# Patient Record
Sex: Female | Born: 1965
Health system: Southern US, Community
[De-identification: ages and names within clinical notes are randomized; demographics above are authoritative.]

## PROBLEM LIST (undated history)

## (undated) DIAGNOSIS — D649 Anemia, unspecified: Secondary | ICD-10-CM

## (undated) DIAGNOSIS — L910 Hypertrophic scar: Secondary | ICD-10-CM

## (undated) DIAGNOSIS — M1990 Unspecified osteoarthritis, unspecified site: Secondary | ICD-10-CM

## (undated) DIAGNOSIS — Z5189 Encounter for other specified aftercare: Secondary | ICD-10-CM

## (undated) DIAGNOSIS — D219 Benign neoplasm of connective and other soft tissue, unspecified: Secondary | ICD-10-CM

## (undated) DIAGNOSIS — F419 Anxiety disorder, unspecified: Secondary | ICD-10-CM

## (undated) DIAGNOSIS — I2699 Other pulmonary embolism without acute cor pulmonale: Secondary | ICD-10-CM

## (undated) DIAGNOSIS — R0600 Dyspnea, unspecified: Secondary | ICD-10-CM

## (undated) DIAGNOSIS — R51 Headache: Secondary | ICD-10-CM

## (undated) DIAGNOSIS — I890 Lymphedema, not elsewhere classified: Secondary | ICD-10-CM

## (undated) DIAGNOSIS — F329 Major depressive disorder, single episode, unspecified: Secondary | ICD-10-CM

## (undated) HISTORY — DX: Other pulmonary embolism without acute cor pulmonale: I26.99

## (undated) HISTORY — PX: ABDOMINAL HYSTERECTOMY: SHX81

## (undated) HISTORY — DX: Dyspnea, unspecified: R06.00

## (undated) HISTORY — PX: OTHER SURGICAL HISTORY: SHX169

## (undated) SURGERY — Surgical Case
Anesthesia: *Unknown

---

## 1998-12-01 ENCOUNTER — Emergency Department (HOSPITAL_COMMUNITY): Admission: EM | Admit: 1998-12-01 | Discharge: 1998-12-01 | Payer: Self-pay | Admitting: Internal Medicine

## 1999-11-11 ENCOUNTER — Encounter: Payer: Self-pay | Admitting: Internal Medicine

## 1999-11-11 ENCOUNTER — Encounter: Admission: RE | Admit: 1999-11-11 | Discharge: 1999-11-11 | Payer: Self-pay | Admitting: Internal Medicine

## 1999-11-13 ENCOUNTER — Emergency Department (HOSPITAL_COMMUNITY): Admission: EM | Admit: 1999-11-13 | Discharge: 1999-11-13 | Payer: Self-pay | Admitting: Emergency Medicine

## 1999-11-13 ENCOUNTER — Encounter: Payer: Self-pay | Admitting: Emergency Medicine

## 2000-06-12 ENCOUNTER — Emergency Department (HOSPITAL_COMMUNITY): Admission: EM | Admit: 2000-06-12 | Discharge: 2000-06-12 | Payer: Self-pay | Admitting: Emergency Medicine

## 2000-06-12 ENCOUNTER — Encounter: Payer: Self-pay | Admitting: Emergency Medicine

## 2000-09-20 ENCOUNTER — Other Ambulatory Visit: Admission: RE | Admit: 2000-09-20 | Discharge: 2000-09-20 | Payer: Self-pay | Admitting: Obstetrics and Gynecology

## 2001-05-24 ENCOUNTER — Emergency Department (HOSPITAL_COMMUNITY): Admission: EM | Admit: 2001-05-24 | Discharge: 2001-05-24 | Payer: Self-pay | Admitting: Emergency Medicine

## 2002-01-19 ENCOUNTER — Emergency Department (HOSPITAL_COMMUNITY): Admission: EM | Admit: 2002-01-19 | Discharge: 2002-01-19 | Payer: Self-pay | Admitting: Emergency Medicine

## 2002-01-19 ENCOUNTER — Encounter: Payer: Self-pay | Admitting: Emergency Medicine

## 2003-05-18 ENCOUNTER — Other Ambulatory Visit: Admission: RE | Admit: 2003-05-18 | Discharge: 2003-05-18 | Payer: Self-pay | Admitting: Obstetrics and Gynecology

## 2004-08-20 ENCOUNTER — Emergency Department (HOSPITAL_COMMUNITY): Admission: EM | Admit: 2004-08-20 | Discharge: 2004-08-20 | Payer: Self-pay | Admitting: Family Medicine

## 2007-02-28 HISTORY — PX: HERNIA REPAIR: SHX51

## 2010-04-14 ENCOUNTER — Emergency Department (HOSPITAL_COMMUNITY): Payer: BC Managed Care – PPO

## 2010-04-14 ENCOUNTER — Emergency Department (HOSPITAL_COMMUNITY)
Admission: EM | Admit: 2010-04-14 | Discharge: 2010-04-15 | Disposition: A | Discharge status: Home / Self Care | Payer: BC Managed Care – PPO | Attending: Emergency Medicine | Admitting: Emergency Medicine

## 2010-04-14 DIAGNOSIS — R55 Syncope and collapse: Secondary | ICD-10-CM | POA: Insufficient documentation

## 2010-04-14 DIAGNOSIS — R0789 Other chest pain: Secondary | ICD-10-CM | POA: Insufficient documentation

## 2010-04-14 DIAGNOSIS — R42 Dizziness and giddiness: Secondary | ICD-10-CM | POA: Insufficient documentation

## 2010-04-14 DIAGNOSIS — R0602 Shortness of breath: Secondary | ICD-10-CM | POA: Insufficient documentation

## 2010-04-14 LAB — URINALYSIS, ROUTINE W REFLEX MICROSCOPIC
Bilirubin Urine: NEGATIVE
Ketones, ur: NEGATIVE mg/dL
Leukocytes, UA: NEGATIVE
Nitrite: NEGATIVE
Protein, ur: NEGATIVE mg/dL
Specific Gravity, Urine: 1.01 (ref 1.005–1.030)
Urine Glucose, Fasting: NEGATIVE mg/dL
Urobilinogen, UA: 0.2 mg/dL (ref 0.0–1.0)
pH: 5.5 (ref 5.0–8.0)

## 2010-04-14 LAB — URINE MICROSCOPIC-ADD ON

## 2010-04-14 LAB — POCT PREGNANCY, URINE: Preg Test, Ur: NEGATIVE

## 2010-04-15 ENCOUNTER — Emergency Department (HOSPITAL_COMMUNITY): Payer: BC Managed Care – PPO

## 2010-04-15 ENCOUNTER — Encounter (HOSPITAL_COMMUNITY): Payer: Self-pay

## 2010-04-15 LAB — POCT CARDIAC MARKERS
CKMB, poc: <1 ng/mL — ABNORMAL LOW (ref 1.0–8.0)
CKMB, poc: <1 ng/mL — ABNORMAL LOW (ref 1.0–8.0)
Myoglobin, poc: 59.7 ng/mL (ref 12–200)
Myoglobin, poc: 63.4 ng/mL (ref 12–200)
Troponin i, poc: <0.05 ng/mL (ref 0.00–0.09)
Troponin i, poc: <0.05 ng/mL (ref 0.00–0.09)

## 2010-04-15 LAB — BASIC METABOLIC PANEL
BUN: 7 mg/dL (ref 6–23)
CO2: 23 mEq/L (ref 19–32)
Calcium: 9.5 mg/dL (ref 8.4–10.5)
Chloride: 109 mEq/L (ref 96–112)
Creatinine, Ser: 0.88 mg/dL (ref 0.4–1.2)
GFR calc Af Amer: >60 mL/min (ref >60)
GFR calc non Af Amer: >60 mL/min (ref >60)
Glucose, Bld: 117 mg/dL — ABNORMAL HIGH (ref 70–99)
Potassium: 3.5 mEq/L (ref 3.5–5.1)
Sodium: 137 mEq/L (ref 135–145)

## 2010-04-15 LAB — CBC
HCT: 28.7 % — ABNORMAL LOW (ref 36.0–46.0)
Hemoglobin: 8.2 g/dL — ABNORMAL LOW (ref 12.0–15.0)
MCH: 20.8 pg — ABNORMAL LOW (ref 26.0–34.0)
MCHC: 28.6 g/dL — ABNORMAL LOW (ref 30.0–36.0)
MCV: 72.8 fL — ABNORMAL LOW (ref 78.0–100.0)
Platelets: 435 10*3/uL — ABNORMAL HIGH (ref 150–400)
RBC: 3.94 MIL/uL (ref 3.87–5.11)
RDW: 19 % — ABNORMAL HIGH (ref 11.5–15.5)
WBC: 8.7 10*3/uL (ref 4.0–10.5)

## 2010-04-15 LAB — DIFFERENTIAL
Basophils Absolute: 0 10*3/uL (ref 0.0–0.1)
Basophils Relative: 1 % (ref 0–1)
Eosinophils Absolute: 0.4 10*3/uL (ref 0.0–0.7)
Eosinophils Relative: 5 % (ref 0–5)
Lymphocytes Relative: 29 % (ref 12–46)
Lymphs Abs: 2.5 10*3/uL (ref 0.7–4.0)
Monocytes Absolute: 0.9 10*3/uL (ref 0.1–1.0)
Monocytes Relative: 10 % (ref 3–12)
Neutro Abs: 4.9 10*3/uL (ref 1.7–7.7)
Neutrophils Relative %: 56 % (ref 43–77)

## 2010-04-15 LAB — BRAIN NATRIURETIC PEPTIDE: Pro B Natriuretic peptide (BNP): <30 pg/mL (ref 0.0–100.0)

## 2010-04-15 MED ORDER — IOHEXOL 300 MG/ML  SOLN
100.0000 mL | Freq: Once | INTRAMUSCULAR | Status: AC | PRN
  Administered 2010-04-15: 100 mL via INTRAVENOUS

## 2010-08-28 DIAGNOSIS — F32A Depression, unspecified: Secondary | ICD-10-CM

## 2010-08-28 HISTORY — DX: Depression, unspecified: F32.A

## 2010-08-30 ENCOUNTER — Emergency Department (HOSPITAL_COMMUNITY)
Admission: EM | Admit: 2010-08-30 | Discharge: 2010-08-31 | Disposition: A | Discharge status: Other Acute Inpatient Hospital | Payer: BC Managed Care – PPO | Attending: Emergency Medicine | Admitting: Emergency Medicine

## 2010-08-30 DIAGNOSIS — F329 Major depressive disorder, single episode, unspecified: Secondary | ICD-10-CM | POA: Insufficient documentation

## 2010-08-30 DIAGNOSIS — T394X2A Poisoning by antirheumatics, not elsewhere classified, intentional self-harm, initial encounter: Secondary | ICD-10-CM | POA: Insufficient documentation

## 2010-08-30 DIAGNOSIS — T39314A Poisoning by propionic acid derivatives, undetermined, initial encounter: Secondary | ICD-10-CM | POA: Insufficient documentation

## 2010-08-30 DIAGNOSIS — R5381 Other malaise: Secondary | ICD-10-CM | POA: Insufficient documentation

## 2010-08-30 DIAGNOSIS — L910 Hypertrophic scar: Secondary | ICD-10-CM | POA: Insufficient documentation

## 2010-08-30 DIAGNOSIS — R630 Anorexia: Secondary | ICD-10-CM | POA: Insufficient documentation

## 2010-08-30 DIAGNOSIS — R45851 Suicidal ideations: Secondary | ICD-10-CM | POA: Insufficient documentation

## 2010-08-30 DIAGNOSIS — T398X2A Poisoning by other nonopioid analgesics and antipyretics, not elsewhere classified, intentional self-harm, initial encounter: Secondary | ICD-10-CM | POA: Insufficient documentation

## 2010-08-30 DIAGNOSIS — R6889 Other general symptoms and signs: Secondary | ICD-10-CM | POA: Insufficient documentation

## 2010-08-30 DIAGNOSIS — F3289 Other specified depressive episodes: Secondary | ICD-10-CM | POA: Insufficient documentation

## 2010-08-30 LAB — COMPREHENSIVE METABOLIC PANEL
ALT: 11 U/L (ref 0–35)
AST: 15 U/L (ref 0–37)
Albumin: 3.7 g/dL (ref 3.5–5.2)
Alkaline Phosphatase: 54 U/L (ref 39–117)
BUN: 8 mg/dL (ref 6–23)
CO2: 21 mEq/L (ref 19–32)
Calcium: 9.5 mg/dL (ref 8.4–10.5)
Chloride: 105 mEq/L (ref 96–112)
Creatinine, Ser: 0.76 mg/dL (ref 0.50–1.10)
GFR calc Af Amer: >60 mL/min (ref >60)
GFR calc non Af Amer: >60 mL/min (ref >60)
Glucose, Bld: 95 mg/dL (ref 70–99)
Potassium: 3.8 mEq/L (ref 3.5–5.1)
Sodium: 135 mEq/L (ref 135–145)
Total Bilirubin: 0.6 mg/dL (ref 0.3–1.2)
Total Protein: 7.3 g/dL (ref 6.0–8.3)

## 2010-08-30 LAB — ACETAMINOPHEN LEVEL: Acetaminophen (Tylenol), Serum: <15 ug/mL (ref 10–30)

## 2010-08-30 LAB — CBC
HCT: 35.4 % — ABNORMAL LOW (ref 36.0–46.0)
Hemoglobin: 11.8 g/dL — ABNORMAL LOW (ref 12.0–15.0)
MCH: 28.4 pg (ref 26.0–34.0)
MCHC: 33.3 g/dL (ref 30.0–36.0)
MCV: 85.1 fL (ref 78.0–100.0)
Platelets: 359 10*3/uL (ref 150–400)
RBC: 4.16 MIL/uL (ref 3.87–5.11)
RDW: 14.1 % (ref 11.5–15.5)
WBC: 6.8 10*3/uL (ref 4.0–10.5)

## 2010-08-30 LAB — URINALYSIS, ROUTINE W REFLEX MICROSCOPIC
Glucose, UA: NEGATIVE mg/dL
Ketones, ur: 15 mg/dL — AB
Nitrite: NEGATIVE
Protein, ur: NEGATIVE mg/dL
Specific Gravity, Urine: 1.028 (ref 1.005–1.030)
Urobilinogen, UA: 1 mg/dL (ref 0.0–1.0)
pH: 5.5 (ref 5.0–8.0)

## 2010-08-30 LAB — URINE MICROSCOPIC-ADD ON

## 2010-08-30 LAB — DIFFERENTIAL
Basophils Absolute: 0 10*3/uL (ref 0.0–0.1)
Basophils Relative: 0 % (ref 0–1)
Eosinophils Absolute: 0.3 10*3/uL (ref 0.0–0.7)
Eosinophils Relative: 4 % (ref 0–5)
Lymphocytes Relative: 40 % (ref 12–46)
Lymphs Abs: 2.7 10*3/uL (ref 0.7–4.0)
Monocytes Absolute: 0.5 10*3/uL (ref 0.1–1.0)
Monocytes Relative: 8 % (ref 3–12)
Neutro Abs: 3.3 10*3/uL (ref 1.7–7.7)
Neutrophils Relative %: 48 % (ref 43–77)

## 2010-08-30 LAB — RAPID URINE DRUG SCREEN, HOSP PERFORMED
Amphetamines: NOT DETECTED
Barbiturates: NOT DETECTED
Benzodiazepines: NOT DETECTED
Cocaine: NOT DETECTED
Opiates: NOT DETECTED
Tetrahydrocannabinol: NOT DETECTED

## 2010-08-30 LAB — SALICYLATE LEVEL: Salicylate Lvl: <2 mg/dL — ABNORMAL LOW (ref 2.8–20.0)

## 2010-08-30 LAB — ETHANOL: Alcohol, Ethyl (B): <11 mg/dL (ref 0–11)

## 2010-08-30 LAB — POCT PREGNANCY, URINE: Preg Test, Ur: NEGATIVE

## 2010-08-31 ENCOUNTER — Inpatient Hospital Stay (HOSPITAL_COMMUNITY)
Admission: AD | Admit: 2010-08-31 | Discharge: 2010-09-06 | DRG: 430 | Disposition: A | Discharge status: Home / Self Care | Payer: BC Managed Care – PPO | Source: Ambulatory Visit | Attending: Psychiatry | Admitting: Psychiatry

## 2010-08-31 DIAGNOSIS — Z5900 Homelessness unspecified: Secondary | ICD-10-CM

## 2010-08-31 DIAGNOSIS — D219 Benign neoplasm of connective and other soft tissue, unspecified: Secondary | ICD-10-CM

## 2010-08-31 DIAGNOSIS — Z6379 Other stressful life events affecting family and household: Secondary | ICD-10-CM

## 2010-08-31 DIAGNOSIS — F332 Major depressive disorder, recurrent severe without psychotic features: Principal | ICD-10-CM

## 2010-08-31 DIAGNOSIS — D649 Anemia, unspecified: Secondary | ICD-10-CM

## 2010-09-01 DIAGNOSIS — F329 Major depressive disorder, single episode, unspecified: Secondary | ICD-10-CM

## 2010-09-05 NOTE — H&P (Signed)
[name], [name] NO.:  [id number]  MEDICAL RECORD NO.:  [id number]  LOCATION:  0307                          FACILITY:  [facility]  PHYSICIAN:  Franchot Gallo, MD     DATE OF BIRTH:  [birth year]  DATE OF ADMISSION:  08/31/2010 DATE OF DISCHARGE:                      PSYCHIATRIC ADMISSION ASSESSMENT   This is a 45 year old, single, African American female.  She presented to the [facility] ED.  She reported she was depressed.  She reported having tried to overdose a few days ago.  She reported that she had taken a bottle of Advil PM.  Later on, she altered this is a little bit. She stated that the suicidal ideation was precipitated by death in the family, loss of home, being homeless, that she had taken ibuprofen, 20 of them, and that this had taken place 7 days ago.  It was witnessed by no one and it did not require any hospitalization.  The patient has recently started therapy.  She has had 2 sessions with the Child psychotherapist, her E[facility] center.  She otherwise has no formal psychiatric history, either inpatient or outpatient.  SOCIAL HISTORY:  She reports 3 years of college.  She has never married. She has one daughter who is 80.  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  She was evicted last month on June 15th, as she could not pay her rent.  She and her daughter are currently living with her mother.  FAMILY HISTORY:  She reports her father is an alcoholic.  ALCOHOL AND DRUG HISTORY:  She denies, and there was no evidence for any drug abuse.  PRIMARY CARE PROVIDER:  She does not have a primary care.  She has no psychiatrist.  She has seen a Child psychotherapist for therapy twice.  MEDICAL PROBLEMS:  She is known to have fibroids.  She recently participated in a clinical trial through [facility].  She was on an investigational drug that she only takes during her menses.  She has a large keloid on her neck.  She states that this is from an  ingrown hair.  MEDICATIONS:  She is not actually taking any at the moment.  During her menses she takes this investigational drug for her fibroids.  DRUG ALLERGIES:  No known drug allergies.  POSITIVE PHYSICAL FINDINGS:  Well-developed, well-nourished, African American female who appears her stated age.  She is not in any acute distress at this time.  She was afebrile.  Her temperature was 98.3 to 99.2.  Her pulse ranged from 68 to 99, respirations were 16 to 18 and blood pressure was 107/61 to 123/83.  Her urine pregnancy was negative. Her hematocrit and hemoglobin were low slightly anemic at 11.8 and 35.4. She did have a trace amount of leukocyte esterase.  She had a few bacteria.  Her urine pregnancy was negative.  She reports a little bit of distress with urination, so we will go ahead and treat for a UTI even though she is nitrite negative.  She had no measurable alcohol and she had no substances noted in her urine drug screen.  MENTAL STATUS EXAM:  She is alert and oriented.  She is casually groomed and dressed  in hospital scrubs.  Her speech is normal.  Her mood, she reports that she has decreased sleep, decreased energy, increased guilt, decreased interest and decreased concentration.  Thought processes are intact, clear and rational.  She wants to get off the medication. Judgment and insight are intact.  Concentration and memory are intact. Intelligence is at least average.  She still endorses suicidal ideation. She is not homicidal.  She denies auditory or visual hallucinations and she is not observed to be responding to internal stimuli.  DIAGNOSES:   Axis I:  Major depressive disorder, recurrent, severe without psychotic features or behaviors. Axis II:  Deferred. Axis III:  Fibroids with resultant anemia. Axis IV:  Inability to pay rent and hence being evicted. Axis V:  35.  When asked about the various family deaths that she alluded to, apparently her father died  several years ago and then she was also noting family deaths that had occurred as far back as 2000.  These were not close relatives, they were cousins and such.  We will start her on some Prozac.  We will check with her EAP regarding outpatient therapy, and once she is no longer suicidal we will return her to her prehospital situation.  We will plan to have a family session with her mother.     [name], P.A.-C.   ______________________________ Franchot Gallo, MD    MD/MEDQ  D:  08/31/2010  T:  08/31/2010  Job:  [id number]  Electronically Signed by Jaci Lazier ADAMS P.A.-C. on 09/03/2010 09:49:10 AM Electronically Signed by Franchot Gallo MD on 09/05/2010 07:45:02 AM

## 2010-09-13 NOTE — Discharge Summary (Signed)
  Lauren, [name] NO.:  [id number]  MEDICAL RECORD NO.:  [id number]  LOCATION:  0507                          FACILITY:  [facility]  PHYSICIAN:  Franchot Gallo, MD     DATE OF BIRTH:  1965/12/01  DATE OF ADMISSION:  08/31/2010 DATE OF DISCHARGE:  09/06/2010                              DISCHARGE SUMMARY   REASON FOR ADMISSION:  This was a 45 year old female that presented with history of depression who had tried to overdose a few days prior to this admission, taking a bottle of Advil, having stressors with the death of a family member, losing her home, being homeless.  FINAL IMPRESSION:  AXIS I:  Major depressive disorder, recurrent, severe, without psychotic features or behaviors. AXIS II: Deferred. AXIS III: Fibroids. AXIS IV: Problems with housing, psychosocial stressors, financial constraints. AXIS V: 50-55.  PERTINENT LABS:  Urine pregnancy test was negative.  Hemoglobin 11.8, hematocrit 35.4.  Urine drug screen was negative.  SIGNIFICANT FINDINGS:  This is a well-developed, well-nourished female who was in no distress and appeared her stated age.  She was coherent. She was endorsing suicidal thinking.  We initiated Prozac.  The patient was encouraged to go to groups.  She was participating in groups.  The patient was endorsing problems with sleep and appetite, rating her depression 7-8 on a scale of 1-10.  She denied any homicidal thoughts or auditory hallucinations.  She was having no medication side effects.  We had contact with her primary support and addressed any safety issues, and we provided information on suicide risk factors and suicide prevention and intervention.  The patient's sleep was improving, but still having issues, tossing and turning.  Her appetite was improving.  Her depression was lessening, rating it a 6 on a scale of 1-10.  She was beginning to improve.  Denied any suicidal thoughts, but having problems with anxiety.  She  was receiving some benefit from the groups and her sleeping had improved. We increased her Prozac to 30 mg to lessen her depressive symptoms.  We also added some hydroxyzine for anxiety.  She was denied any suicidal or homicidal thoughts or auditory hallucinations.  On day of discharge, her sleep was decreased, but due to her thinking about her possibly going home, her appetite had improved, having mild depressive symptoms rating it a 2-3 on a scale of 1-10.  Denied any suicidal or homicidal thoughts. Denied any auditory or visual hallucinations.  Her anxiety was mild, rating it a 3 on a scale of 1-10, having no medication side effects.  DISCHARGE MEDICATIONS: 1. Prozac 10 mg taking 3 daily. 2. Hydroxyzine 25 mg taking 1 q.h.s. p.r.n. for sleep. 3. Iron supplements daily.  FOLLOWUP:  Her follow-up appointment was with Clovis Riley at [facility], phone number [phone] on Tuesday, July 17 at 9:00 a.m.     Landry Corporal, N.P.   ______________________________ Franchot Gallo, MD    JO/MEDQ  D:  09/13/2010  T:  09/13/2010  Job:  [id number]  Electronically Signed by Limmie PatriciaP. on 09/13/2010 12:07:33 PM Electronically Signed by Franchot Gallo MD on 09/13/2010 12:41:29 PM

## 2011-01-22 ENCOUNTER — Encounter (HOSPITAL_COMMUNITY): Payer: Self-pay

## 2011-01-22 ENCOUNTER — Observation Stay (HOSPITAL_COMMUNITY)
Admission: AD | Admit: 2011-01-22 | Discharge: 2011-01-23 | Disposition: A | Discharge status: Home / Self Care | Payer: BC Managed Care – PPO | Source: Ambulatory Visit | Attending: Obstetrics and Gynecology | Admitting: Obstetrics and Gynecology

## 2011-01-22 ENCOUNTER — Inpatient Hospital Stay (HOSPITAL_COMMUNITY): Payer: BC Managed Care – PPO

## 2011-01-22 DIAGNOSIS — Z5189 Encounter for other specified aftercare: Secondary | ICD-10-CM

## 2011-01-22 DIAGNOSIS — F32A Depression, unspecified: Secondary | ICD-10-CM

## 2011-01-22 DIAGNOSIS — N920 Excessive and frequent menstruation with regular cycle: Principal | ICD-10-CM | POA: Insufficient documentation

## 2011-01-22 DIAGNOSIS — F329 Major depressive disorder, single episode, unspecified: Secondary | ICD-10-CM

## 2011-01-22 DIAGNOSIS — L910 Hypertrophic scar: Secondary | ICD-10-CM

## 2011-01-22 DIAGNOSIS — D62 Acute posthemorrhagic anemia: Secondary | ICD-10-CM

## 2011-01-22 DIAGNOSIS — D649 Anemia, unspecified: Secondary | ICD-10-CM | POA: Insufficient documentation

## 2011-01-22 DIAGNOSIS — IMO0001 Reserved for inherently not codable concepts without codable children: Secondary | ICD-10-CM

## 2011-01-22 HISTORY — DX: Hypertrophic scar: L91.0

## 2011-01-22 HISTORY — DX: Major depressive disorder, single episode, unspecified: F32.9

## 2011-01-22 HISTORY — DX: Benign neoplasm of connective and other soft tissue, unspecified: D21.9

## 2011-01-22 HISTORY — DX: Anemia, unspecified: D64.9

## 2011-01-22 HISTORY — DX: Encounter for other specified aftercare: Z51.89

## 2011-01-22 HISTORY — DX: Reserved for inherently not codable concepts without codable children: IMO0001

## 2011-01-22 LAB — DIFFERENTIAL
Basophils Absolute: 0 10*3/uL (ref 0.0–0.1)
Basophils Relative: 0 % (ref 0–1)
Eosinophils Absolute: 0.2 10*3/uL (ref 0.0–0.7)
Eosinophils Relative: 3 % (ref 0–5)
Lymphocytes Relative: 28 % (ref 12–46)
Lymphs Abs: 1.7 10*3/uL (ref 0.7–4.0)
Monocytes Absolute: 0.5 10*3/uL (ref 0.1–1.0)
Monocytes Relative: 9 % (ref 3–12)
Neutro Abs: 3.8 10*3/uL (ref 1.7–7.7)
Neutrophils Relative %: 61 % (ref 43–77)

## 2011-01-22 LAB — CBC
HCT: 30.3 % — ABNORMAL LOW (ref 36.0–46.0)
HCT: 27.2 % — ABNORMAL LOW (ref 36.0–46.0)
Hemoglobin: 9.6 g/dL — ABNORMAL LOW (ref 12.0–15.0)
Hemoglobin: 8.7 g/dL — ABNORMAL LOW (ref 12.0–15.0)
MCH: 27.4 pg (ref 26.0–34.0)
MCH: 27.4 pg (ref 26.0–34.0)
MCHC: 31.7 g/dL (ref 30.0–36.0)
MCHC: 32 g/dL (ref 30.0–36.0)
MCV: 86.3 fL (ref 78.0–100.0)
MCV: 85.8 fL (ref 78.0–100.0)
Platelets: 296 10*3/uL (ref 150–400)
Platelets: 272 10*3/uL (ref 150–400)
RBC: 3.51 MIL/uL — ABNORMAL LOW (ref 3.87–5.11)
RBC: 3.17 MIL/uL — ABNORMAL LOW (ref 3.87–5.11)
RDW: 16.6 % — ABNORMAL HIGH (ref 11.5–15.5)
RDW: 16.6 % — ABNORMAL HIGH (ref 11.5–15.5)
WBC: 6.2 10*3/uL (ref 4.0–10.5)
WBC: 6 10*3/uL (ref 4.0–10.5)

## 2011-01-22 LAB — ABO/RH: ABO/RH(D): A POS

## 2011-01-22 LAB — PREPARE RBC (CROSSMATCH)

## 2011-01-22 LAB — WET PREP, GENITAL
Clue Cells Wet Prep HPF POC: NONE SEEN
Trich, Wet Prep: NONE SEEN
Yeast Wet Prep HPF POC: NONE SEEN

## 2011-01-22 LAB — HCG, QUANTITATIVE, PREGNANCY: hCG, Beta Chain, Quant, S: <1 m[IU]/mL (ref <5)

## 2011-01-22 MED ORDER — INFLUENZA VIRUS VACC SPLIT PF IM SUSP
0.5000 mL | INTRAMUSCULAR | Status: AC
  Administered 2011-01-23: 0.5 mL via INTRAMUSCULAR
  Filled 2011-01-22: qty 0.5

## 2011-01-22 MED ORDER — ONDANSETRON HCL 4 MG PO TABS: 4.0000 mg | ORAL_TABLET | Freq: Four times a day (QID) | ORAL | Status: DC | PRN

## 2011-01-22 MED ORDER — IBUPROFEN 800 MG PO TABS
800.0000 mg | ORAL_TABLET | Freq: Three times a day (TID) | ORAL | Status: DC | PRN
  Administered 2011-01-22: 800 mg via ORAL
  Filled 2011-01-22: qty 1

## 2011-01-22 MED ORDER — DOCUSATE SODIUM 100 MG PO CAPS
100.0000 mg | ORAL_CAPSULE | Freq: Every day | ORAL | Status: DC
  Administered 2011-01-22 – 2011-01-23 (×2): 100 mg via ORAL
  Filled 2011-01-22 (×5): qty 1

## 2011-01-22 MED ORDER — ONDANSETRON HCL 4 MG/2ML IJ SOLN: 4.0000 mg | Freq: Four times a day (QID) | INTRAMUSCULAR | Status: DC | PRN

## 2011-01-22 MED ORDER — MEDROXYPROGESTERONE ACETATE 10 MG PO TABS
10.0000 mg | ORAL_TABLET | Freq: Every day | ORAL | Status: DC
  Administered 2011-01-22 – 2011-01-23 (×2): 10 mg via ORAL
  Filled 2011-01-22 (×3): qty 1

## 2011-01-22 MED ORDER — PRENATAL PLUS 27-1 MG PO TABS
1.0000 | ORAL_TABLET | Freq: Every day | ORAL | Status: DC
  Administered 2011-01-22 – 2011-01-23 (×2): 1 via ORAL
  Filled 2011-01-22 (×5): qty 1

## 2011-01-22 MED ORDER — LACTATED RINGERS IV BOLUS (SEPSIS)
250.0000 mL | Freq: Once | INTRAVENOUS | Status: AC
  Administered 2011-01-22: 1000 mL via INTRAVENOUS

## 2011-01-22 MED ORDER — TRAZODONE HCL 50 MG PO TABS
50.0000 mg | ORAL_TABLET | Freq: Every evening | ORAL | Status: DC | PRN
  Filled 2011-01-22: qty 1

## 2011-01-22 MED ORDER — DIPHENHYDRAMINE HCL 25 MG PO CAPS
25.0000 mg | ORAL_CAPSULE | Freq: Once | ORAL | Status: AC
  Administered 2011-01-22: 25 mg via ORAL
  Filled 2011-01-22: qty 1

## 2011-01-22 MED ORDER — LACTATED RINGERS IV SOLN
INTRAVENOUS | Status: DC
  Administered 2011-01-22: 20:00:00 via INTRAVENOUS

## 2011-01-22 MED ORDER — ACETAMINOPHEN 325 MG PO TABS
650.0000 mg | ORAL_TABLET | Freq: Once | ORAL | Status: AC
  Administered 2011-01-22: 650 mg via ORAL
  Filled 2011-01-22: qty 2

## 2011-01-22 MED ORDER — ESTROGENS CONJUGATED 25 MG IJ SOLR
25.0000 mg | Freq: Once | INTRAMUSCULAR | Status: AC
  Administered 2011-01-22: 25 mg via INTRAVENOUS
  Filled 2011-01-22: qty 25

## 2011-01-22 MED ORDER — KETOROLAC TROMETHAMINE 30 MG/ML IJ SOLN: 30.0000 mg | Freq: Four times a day (QID) | INTRAMUSCULAR | Status: DC | PRN

## 2011-01-22 MED ORDER — POLYSACCHARIDE IRON 150 MG PO CAPS
150.0000 mg | ORAL_CAPSULE | Freq: Two times a day (BID) | ORAL | Status: DC
  Administered 2011-01-22 – 2011-01-23 (×2): 150 mg via ORAL
  Filled 2011-01-22 (×4): qty 1

## 2011-01-22 MED ORDER — TRAMADOL HCL 50 MG PO TABS
50.0000 mg | ORAL_TABLET | Freq: Four times a day (QID) | ORAL | Status: DC | PRN
  Filled 2011-01-22: qty 1

## 2011-01-22 NOTE — Progress Notes (Signed)
Patient arrived via EMS directed to room 4 in MAU c/o heavy vaginal bleeding since last night changing a pad every 2 hours or sooner, with dizziness, patient states she has a history of fibroids and had her last [place] in June 2012 was involved in a fibroid study, patient is also anemic and currently takes iron for this, patient has history of depression, hernia repair in 2009, keloids, does not smoke, drink or use illegal drugs. Patient states that she had a positive pregnancy test about one month ago, bleed 2 weeks after that, then started bleeding again last night.

## 2011-01-22 NOTE — H&P (Signed)
[name] is an 45 y.o. black female who presents unannounced via EMS with CC of heavy vaginal bleeding and dizziness. She reports vaginal bleeding w/ onset around 1700 yesterday.  Soaked through her clothes and sheets 4x since then before calling EMS w/ dizziness today.  Presented w/ IV and had received 500 ml NS bolus en route.  She reports positive UPT 1 mo ago, but had not confirmed pregnancy b/c about 2 weeks ago, had 1 day of heavy bleeding and thought she had miscarried; her regular period was due a week before that.  She was also in [place] visiting family around that same time.  Pt saw dr. Estanislado Pandy for aex about 3 months ago and started on BYaz.  She hasn't taken it in the last 2 days, but continued taking it despite pos UPT.  Her partner has also had a vasectomy.  Pt reports lower abdominal cramping 7/10, and hasn't taken any meds for this thus far.  She lives w/ her 29 y.o. Daughter.  She takes Wellbutrin xl 150mg  po qd, Prozac 40mg  po qd, and Trazadone 50mg  po qhs.  She was in ED 1st of July with psychiatric complaints and CBC showed Hbg=11.8 at that time.   Has eaten since yesterday.  Does report despite IVF, feeling dizzy at rest.  Has not been up OOB since arrival 3 hrs ago, except for orthostatic VS measurement.  Pt does not smoke, and denies any past h/o blood clots. Pertinent Gynecological History: Menses: flow is moderate and flow can last 3-7 days Bleeding: regular periods Contraception: OCP (estrogen/progesterone) and vasectomy DES exposure: denies Blood transfusions: none Sexually transmitted diseases: remote h/o chlamydia and genital warts Previous GYN Procedures: n/a  Last mammogram: did not discuss Date: n/a Last pap: normal Date: summer 2012; pt reports h/o abnl in 49's. OB History: G4, P1021 (1 vag delivery and 2 EAB's)  Menstrual History: Menarche age: not recorded. No LMP recorded.    Past Medical History  Diagnosis Date  . Anemia   . Keloid   . Depression   .  Fibroid     Past Surgical History  Procedure Date  . Hernia repair 2009   (s/p delivery--mesh inserted) No family history on file.  Social History:  reports that she has never smoked. She does not have any smokeless tobacco history on file. She reports that she does not drink alcohol or use illicit drugs.  Allergies: No Known Allergies  Prescriptions prior to admission  Medication Sig Dispense Refill  . buPROPion (WELLBUTRIN XL) 150 MG 24 hr tablet Take 150 mg by mouth daily.        . Fe Fum-FePoly-Vit C-Vit B3 (INTEGRA) 62.5-62.5-40-3 MG CAPS Take 1 tablet by mouth daily.        Marland Kitchen FLUoxetine (PROZAC) 40 MG capsule Take 40 mg by mouth daily.        . folic acid (FOLVITE) 1 MG tablet Take 1 mg by mouth 2 (two) times daily.        . traZODone (DESYREL) 50 MG tablet Take 50 mg by mouth at bedtime.          Review of Systems  Constitutional: Negative.   Respiratory: Negative.   Cardiovascular: Negative.   Gastrointestinal: Positive for nausea and abdominal pain.  Genitourinary: Negative.   Skin: Negative.   Neurological: Positive for dizziness.  .. Filed Vitals:   01/22/11 1537 01/22/11 1539 01/22/11 1743 01/22/11 1820  BP: 103/75 106/68 92/46 122/73  Pulse: 122 130 87 83  Temp:   98.2 F (36.8 C) 98.2 F (36.8 C)  TempSrc:      Resp:   16 14  SpO2:        Blood pressure 92/46, pulse 87, temperature 98.2 F (36.8 C), temperature source Oral, resp. rate 16, SpO2 99.00%. Physical Exam  Constitutional: She is oriented to person, place, and time. She appears well-developed and well-nourished. No distress.  Cardiovascular: Normal rate and regular rhythm.   Respiratory: Effort normal and breath sounds normal.  GI: Soft. Bowel sounds are normal. She exhibits no distension and no mass. There is no tenderness. There is no rebound and no guarding.  Genitourinary: Vagina normal.       Vault w/ mod amt of BRB on exam around 1515; numerous clots w/in cervical os removed w/ ring  forceps. Heavier bleeding noted as clots removed, after 3 gauze pads cleared blood, no active bleeding noted from os. Sl odor as speculum placed.  No lesions noted.  Sm amt of blood noted on perineum and vulva.   Difficulty assessing ut size/adnexa secondary to habitus  Neurological: She is alert and oriented to person, place, and time. She has normal reflexes.  Skin: Skin is warm and dry.  Psychiatric: She has a normal mood and affect. Her behavior is normal. Judgment and thought content normal.    Results for orders placed during the hospital encounter of 01/22/11 (from the past 24 hour(s))  WET PREP, GENITAL     Status: Abnormal   Collection Time   01/22/11  3:05 PM      Component Value Range   Yeast, Wet Prep NONE SEEN  NONE SEEN    Trich, Wet Prep NONE SEEN  NONE SEEN    Clue Cells, Wet Prep NONE SEEN  NONE SEEN    WBC, Wet Prep HPF POC FEW (*) NONE SEEN   CBC     Status: Abnormal   Collection Time   01/22/11  3:27 PM      Component Value Range   WBC 6.2  4.0 - 10.5 (K/uL)   RBC 3.51 (*) 3.87 - 5.11 (MIL/uL)   Hemoglobin 9.6 (*) 12.0 - 15.0 (g/dL)   HCT 16.1 (*) 09.6 - 46.0 (%)   MCV 86.3  78.0 - 100.0 (fL)   MCH 27.4  26.0 - 34.0 (pg)   MCHC 31.7  30.0 - 36.0 (g/dL)   RDW 04.5 (*) 40.9 - 15.5 (%)   Platelets 296  150 - 400 (K/uL)  DIFFERENTIAL     Status: Normal   Collection Time   01/22/11  3:27 PM      Component Value Range   Neutrophils Relative 61  43 - 77 (%)   Neutro Abs 3.8  1.7 - 7.7 (K/uL)   Lymphocytes Relative 28  12 - 46 (%)   Lymphs Abs 1.7  0.7 - 4.0 (K/uL)   Monocytes Relative 9  3 - 12 (%)   Monocytes Absolute 0.5  0.1 - 1.0 (K/uL)   Eosinophils Relative 3  0 - 5 (%)   Eosinophils Absolute 0.2  0.0 - 0.7 (K/uL)   Basophils Relative 0  0 - 1 (%)   Basophils Absolute 0.0  0.0 - 0.1 (K/uL)  ABO/RH     Status: Normal   Collection Time   01/22/11  3:27 PM      Component Value Range   ABO/RH(D) A POS    HCG, QUANTITATIVE, PREGNANCY     Status: Normal     Collection  Time   01/22/11  3:27 PM      Component Value Range   hCG, Beta Chain, Quant, S <1  <5 (mIU/mL)    US Transvaginal Non-ob  01/22/2011  *RADIOLOGY REPORT*  Clinical Data: Heavy vaginal bleeding.  Positive urine pregnancy test 1 month ago.  TRANSABDOMINAL AND TRANSVAGINAL ULTRASOUND OF PELVIS Technique:  Both transabdominal and transvaginal ultrasound examinations of the pelvis were performed. Transabdominal technique was performed for global imaging of the pelvis including uterus, ovaries, adnexal regions, and pelvic cul-de-sac.  Comparison: None.   It was necessary to proceed with endovaginal exam following the transabdominal exam to visualize the endometrium.  Findings:  Uterus: Measures 11.8 x 7.5 x 7.1 cm.  Echotexture is heterogeneous and there are areas of shadowing.  Endometrium: Somewhat difficult to visualize due to adjacent shadowing.  Measures approximately 1.7 cm.  Right ovary:  Measures 3.1 x 1.5 x 1.8 cm, negative.  Left ovary: Measures 3.5 x 1.8 x 2.1 cm, negative.  Other findings: Trace free fluid.  IMPRESSION: Enlarged and heterogeneous uterus with areas of shadowing. Adenomyosis could have this appearance.  Original Report Authenticated By: Reyes Ivan, M.D.   US Pelvis Complete  01/22/2011  *RADIOLOGY REPORT*  Clinical Data: Heavy vaginal bleeding.  Positive urine pregnancy test 1 month ago.  TRANSABDOMINAL AND TRANSVAGINAL ULTRASOUND OF PELVIS Technique:  Both transabdominal and transvaginal ultrasound examinations of the pelvis were performed. Transabdominal technique was performed for global imaging of the pelvis including uterus, ovaries, adnexal regions, and pelvic cul-de-sac.  Comparison: None.   It was necessary to proceed with endovaginal exam following the transabdominal exam to visualize the endometrium.  Findings:  Uterus: Measures 11.8 x 7.5 x 7.1 cm.  Echotexture is heterogeneous and there are areas of shadowing.  Endometrium: Somewhat difficult to  visualize due to adjacent shadowing.  Measures approximately 1.7 cm.  Right ovary:  Measures 3.1 x 1.5 x 1.8 cm, negative.  Left ovary: Measures 3.5 x 1.8 x 2.1 cm, negative.  Other findings: Trace free fluid.  IMPRESSION: Enlarged and heterogeneous uterus with areas of shadowing. Adenomyosis could have this appearance.  Original Report Authenticated By: Reyes Ivan, M.D.    Assessment/Plan: 1.  Menorrhagia w/ questionable recent preg w/ SAB 2.  Anemic with orthostatic changes  3.  Enlarged ut on u/s 4.  Vag bleeding stable, but changing pad in MAU about q 2hrs  1.  Per c/w dr. Su Hilt, will admit for 23 hr observation 2.  Continue LR at 125, will do 25mg  IV estrogen x1, recheck CBC at 2000 and in AM 3.  Offered pt blood transfusion, and although she is "not opposed," she would like to see what Hbg is at 2000 draw before making decision 4.  Toradol IV or po motrin/tramadol for pain management; continue home meds as prescribed 5.  OOB w/ assistance and BS commode 6.  GC/CT pending 7.  MD to follow.   STEELMAN,CANDICE H 01/22/2011, 6:17 PM  Minimal bleeding now.  I discussed with patient the repeat results of her Hgb and her symptoms and discussed the risks benefits and alternatives of a blood transfusion.  Pt says she feels terrible, "like a dish rag", and she wants to receive the transfusion.  I will transfuse two units and repeat cbc tomorrow.  Questions answered.  Pt reports h/o endometrial biopsy earlier this year or the end of last year.  I also discussed placing her on progesterone and d/c'ing the beyaz.  Pt is agreeable and understands  this will not be birth control but aims to control her bleeding.  She reports not currently being sexually active bc partner is in [place].

## 2011-01-23 LAB — CBC
HCT: 28 % — ABNORMAL LOW (ref 36.0–46.0)
Hemoglobin: 9.2 g/dL — ABNORMAL LOW (ref 12.0–15.0)
MCH: 28.3 pg (ref 26.0–34.0)
MCHC: 32.9 g/dL (ref 30.0–36.0)
MCV: 86.2 fL (ref 78.0–100.0)
Platelets: 245 10*3/uL (ref 150–400)
RBC: 3.25 MIL/uL — ABNORMAL LOW (ref 3.87–5.11)
RDW: 16.3 % — ABNORMAL HIGH (ref 11.5–15.5)
WBC: 10.2 10*3/uL (ref 4.0–10.5)

## 2011-01-23 LAB — GC/CHLAMYDIA PROBE AMP, GENITAL
Chlamydia, DNA Probe: NEGATIVE
GC Probe Amp, Genital: NEGATIVE

## 2011-01-23 MED ORDER — IBUPROFEN 600 MG PO TABS: 600.0000 mg | ORAL_TABLET | Freq: Four times a day (QID) | ORAL | Status: AC | PRN

## 2011-01-23 MED ORDER — NORETHINDRONE-ETH ESTRADIOL 1-35 MG-MCG PO TABS: 1.0000 | ORAL_TABLET | Freq: Every day | ORAL | Status: DC

## 2011-01-23 MED ORDER — POLYSACCHARIDE IRON 150 MG PO CAPS: 150.0000 mg | ORAL_CAPSULE | Freq: Two times a day (BID) | ORAL | Status: DC

## 2011-01-23 MED ORDER — TRAMADOL HCL 50 MG PO TABS: 50.0000 mg | ORAL_TABLET | Freq: Four times a day (QID) | ORAL | Status: AC | PRN

## 2011-01-23 MED ORDER — DSS 100 MG PO CAPS: 100.0000 mg | ORAL_CAPSULE | Freq: Two times a day (BID) | ORAL | Status: AC

## 2011-01-23 NOTE — Progress Notes (Signed)
Patient ID: [name], female   DOB: Apr 11, 1965,  45 y.o.   MRN: [id number]  Subjective: Patient reports  " I feel better".  Pt denies any pain or dizziness.  Only scant vaginal bleeding since last night.  Tolerating regular diet and ambulating without difficulty.      Objective: I have reviewed patient's vital signs, labs and  H/H=9.2/28.0..  Resp: clear to auscultation bilaterally Cardio: regular rate and rhythm GI: soft, non-tender; bowel sounds normal; no masses,  no organomegaly Extremities: Homans sign is negative, no sign of DVT Vaginal Bleeding: scant stain  Assessment: Menorrhagia Anemia S/P Transfusion 2 units PRBC:   Plan: Probable discharge home today. Follow-up with GYN    LOS: 1 day    Shriyan Arakawa, PA-C 01/23/2011, 7:42 AM

## 2011-01-23 NOTE — Discharge Summary (Signed)
  Physician Discharge Summary  Patient ID: [name] MRN: [id number] DOB/AGE: Jul 08, 1965 45 y.o.  Admit date: 01/22/2011 Discharge date: 01/23/2011   Discharge Diagnoses: Menorrhagia                   Anemia Active Problems:  * No active hospital problems. *    Discharged Condition: stable  Hospital Course: Patient was admitted for heavy vaginal bleeding of several days duration accompanied by orthostatic changes and anemia.  Patient was transfused 2 units of packed red blood cells with good response and on hospital day 1 was no longer symptomatic and had only scant bleeding. Patient's bowel and bladder functions were normal.  Having received the maximum benefit of her hospital stay, patient was deemed ready for discharge home.  Patient to take norethindrone/ethinyl estradiol (1 mg/35 mcg) 1 tablet daily.  Significant Diagnostic Studies: Pelvic ultrasound showed an enlarged uterus with a heterogeneous myometrium.  Adenomyosis could not be ruled out.  Disposition: Home or Self Care  Discharge Medications:   Brantley, Naser  Home Medication Instructions [id number]   Printed on:01/23/11 2952  Medication Information                    FLUoxetine (PROZAC) 40 MG capsule Take 40 mg by mouth daily.             folic acid (FOLVITE) 1 MG tablet Take 1 mg by mouth 2 (two) times daily.             traZODone (DESYREL) 50 MG tablet Take 50 mg by mouth at bedtime.             buPROPion (WELLBUTRIN XL) 150 MG 24 hr tablet Take 150 mg by mouth daily.             docusate sodium 100 MG CAPS Take 100 mg by mouth 2 (two) times daily. Take while on iron to keep bowel movements regular.           polysaccharide iron (NIFEREX) 150 MG CAPS capsule Take 1 capsule (150 mg total) by mouth 2 (two) times daily. Take as directed for at least six weeks.           traMADol (ULTRAM) 50 MG tablet Take 1 tablet (50 mg total) by mouth every 6 (six) hours as needed for pain. Maximum dose= 8  tablets per day           ibuprofen (ADVIL) 600 MG tablet Take 1 tablet (600 mg total) by mouth every 6 (six) hours as needed for pain.                Follow-up:  Dr. Estanislado Pandy in 2 weeks   Signed: Patrick Jupiter 01/23/2011, 8:11 AM

## 2011-01-23 NOTE — Progress Notes (Signed)
UR chart review completed.  

## 2011-01-24 LAB — TYPE AND SCREEN
ABO/RH(D): A POS
Antibody Screen: NEGATIVE
Unit division: 0
Unit division: 0

## 2011-02-08 ENCOUNTER — Encounter (HOSPITAL_COMMUNITY): Payer: Self-pay

## 2011-02-13 ENCOUNTER — Other Ambulatory Visit: Payer: Self-pay | Admitting: Obstetrics and Gynecology

## 2011-02-15 NOTE — Patient Instructions (Addendum)
   Your procedure is scheduled on: Wednesday, Dec 26th  Enter through the [employer] of [facility] at: 6:00am Pick up the phone at the desk and dial [phone] and inform us of your arrival.  Please call this number if you have any problems the morning of surgery: [phone]  Remember: Do not eat food after midnight: Tuesday Do not drink clear liquids after: Tuesday Take these medicines the morning of surgery with a SIP OF WATER: prozac, wellbutrin xl, niferex, aygestin  Do not wear jewelry, make-up, or FINGER nail polish Do not wear lotions, powders, perfumes or deodorant. Do not shave 48 hours prior to surgery. Do not bring valuables to the hospital.  Leave suitcase in the car. After Surgery it may be brought to your room. For patients being admitted to the hospital, checkout time is 11:00am the day of discharge.   Patients discharged on the day of surgery will not be allowed to drive home.   Home with Mother - [name]  cell [phone]  Remember to use your hibiclens as instructed.Please shower with 1/2 bottle the evening before your surgery and the other 1/2 bottle the morning of surgery.

## 2011-02-16 ENCOUNTER — Encounter (HOSPITAL_COMMUNITY): Payer: Self-pay

## 2011-02-16 ENCOUNTER — Encounter (HOSPITAL_COMMUNITY)
Admission: RE | Admit: 2011-02-16 | Discharge: 2011-02-16 | Disposition: A | Discharge status: Home / Self Care | Payer: BC Managed Care – PPO | Source: Ambulatory Visit | Attending: Obstetrics and Gynecology | Admitting: Obstetrics and Gynecology

## 2011-02-16 HISTORY — DX: Encounter for other specified aftercare: Z51.89

## 2011-02-16 HISTORY — DX: Headache: R51

## 2011-02-16 HISTORY — DX: Anxiety disorder, unspecified: F41.9

## 2011-02-16 LAB — CBC
HCT: 31.2 % — ABNORMAL LOW (ref 36.0–46.0)
Hemoglobin: 9.8 g/dL — ABNORMAL LOW (ref 12.0–15.0)
MCH: 26.7 pg (ref 26.0–34.0)
MCHC: 31.4 g/dL (ref 30.0–36.0)
MCV: 85 fL (ref 78.0–100.0)
Platelets: 426 10*3/uL — ABNORMAL HIGH (ref 150–400)
RBC: 3.67 MIL/uL — ABNORMAL LOW (ref 3.87–5.11)
RDW: 15.4 % (ref 11.5–15.5)
WBC: 8.4 10*3/uL (ref 4.0–10.5)

## 2011-02-16 LAB — BASIC METABOLIC PANEL
BUN: 8 mg/dL (ref 6–23)
CO2: 24 mEq/L (ref 19–32)
Calcium: 9.3 mg/dL (ref 8.4–10.5)
Chloride: 106 mEq/L (ref 96–112)
Creatinine, Ser: 1.06 mg/dL (ref 0.50–1.10)
GFR calc Af Amer: 73 mL/min — ABNORMAL LOW (ref >90)
GFR calc non Af Amer: 63 mL/min — ABNORMAL LOW (ref >90)
Glucose, Bld: 95 mg/dL (ref 70–99)
Potassium: 3.6 mEq/L (ref 3.5–5.1)
Sodium: 139 mEq/L (ref 135–145)

## 2011-02-17 LAB — SURGICAL PCR SCREEN
MRSA, PCR: NEGATIVE
Staphylococcus aureus: POSITIVE — AB

## 2011-02-17 NOTE — H&P (Signed)
NAME:  [name], [name]             ACCOUNT NO.:  [id number]  MEDICAL RECORD NO.:  [id number]  LOCATION:  [facility]                           FACILITY:  [facility]  PHYSICIAN:  Sandra A. Rivard, M.D. DATE OF BIRTH:  02/25/1966  DATE OF ADMISSION:  02/16/2011 DATE OF DISCHARGE:  02/16/2011                             HISTORY & PHYSICAL   HISTORY OF PRESENT ILLNESS:  [name] is a 45-year-old single African American female, para 1-0-3-1 presenting for a robotically-assisted hysterectomy because of menorrhagia, dysmenorrhea, and adenomyosis.  The patient reports that she has always had a heavy menstrual periods, but over the past 5 years, those periods have increased in volume of flow and discomfort.  In November 2011, the patient experienced significant vaginal bleed to the degree that she decreased her hemoglobin to 6.3 and was transfused 2 units of packed red blood cells. Typically, the patient's menstrual flow may last from 3-10 days, during which time, she changes a tampon plus 2 pads on a hourly basis.  She reports significant clots accompanied by cramping that will extend to her lower back and legs rated at a 9/10 on a 10 point pain scale.  The patient is able However, to take ibuprofen 800 mg and fall asleep.  A pelvic ultrasound in November 2012 showed a uterus measuring 11.8 x 7.5 x 7.1 cm that was heterogeneous with areas of shadowing that resemble features associated with adenomyosis.  Both of the patient's ovaries appeared normal on that study.  The patient went on to say that she does have intermenstrual bleeding that may last for a day, but denies any dyspareunia, urinary frequency, urgency, changes in her bowel movements, or vaginitis symptoms.  The patient was placed on norethindrone 5 mg daily since her last periods which has ameliorated some of her pelvic discomfort and has resulted amenorrhea since her transfusion in November.  Given that, the  patient has had such a protracted  and disruptive history of menorrhagia And dysmenorrhea, she has decided to proceed with definitive therapy in the form of hysterectomy.  PAST MEDICAL HISTORY:  Gravida 4, para 1-0-3-1.  The patient had a spontaneous vaginal birth, weighing 8 pounds and 5 ounces.  She has a history of postpartum depression and hyperemesis.  GYN HISTORY:  Menarche 15-year-old.  Last menstrual period January 31, 2011.  She uses abstinence for contraception.  She has undegone cryotherapy for an abnormal Pap smear in 1990; however, her Pap smears have been normal since that time.  She has a history of Human papillomavirus.  Her last normal Pap smear was in 2012.  MEDICAL HISTORY:  Positive for migraines, anemia, scoliosis, asthma, depression, and keloid formation.  SURGICAL HISTORY:  In 2009, bilateral inguinal hernia repair.  The patient denies any problems with anesthesia.  FAMILY HISTORY:  Cardiovascular disease, asthma, colon cancer, hypertension, deep vein thrombosis (mother), anemia, lupus, and diabetes mellitus.  HABITS:  She denies any alcohol, tobacco, or illicit drug use.  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  CURRENT MEDICATIONS: 1. Integra. 2. Iron twice daily. 3. Wellbutrin 150 mg XL daily. 4. Fluoxetine 40 mg daily. 5. Folic acid 1   mg daily. 6. Trazodone 50 mg daily. 7. Tramadol 50 mg every 6 hours as needed for pain. 8. Norethindrone 5 mg daily. 9. Ibuprofen 600 mg every 6 hours with food as needed.  The patient has no known drug allergies.  She does have sensitivity to latex and is allergic to BEE STINGS.  Denies any sensitivities to peanuts, soy, or shellfish.  REVIEW OF SYSTEMS:  The patient wears contact lenses, and occasionally will have nausea and lower extremity swelling with her  menstrual periods.  She denies any chest pain, shortness of breath, headache, vision changes, vomiting, diarrhea, skin rashes, myalgias, or  arthralgias and except as is mentioned in history of present illness.  The patient's review of systems is otherwise negative.  PHYSICAL EXAMINATION:  VITAL SIGNS:  Blood pressure 124/82, pulse is 84, temperature 98.4 degrees Fahrenheit orally, weight 251 pounds, height 6 feet tall, body mass index is 34. NECK:  Supple without masses.  There is no thyromegaly or cervical adenopathy. HEART:  Regular rate and rhythm. LUNGS:  Clear. BACK:  No CVA tenderness. ABDOMEN:  No tenderness, guarding, rebound or organomegaly. EXTREMITIES:  No clubbing, cyanosis, or edema. PELVIC:  EG BUS is normal.  Vagina is normal.  Cervix is nontender without lesions.  Uterus appears upper limits of normal size and is tender.  Adnexa without any palpable masses, but it is also tender.  IMPRESSION: 1. Menorrhagia. 2. Dysmenorrhea. 3. Adenomyosis.  DISPOSITION:  A discussion was held with the patient regarding indications for her procedures along with its risks, which include, but are not limited to reaction to anesthesia, damage to adjacent organs, infection, excessive bleeding, and the possibility of the need for an open abdominal incision.  The patient was further advised that she will experience transient postoperative facial edema that her hospital stay is expected to be to be 0-2 days, that the robotic approach requires more time than that of an open abdominal incision, and that she is expected to return to her usual activities in 2-3 weeks with the exception of intercourse, for which, she would need to wait 6 weeks. The patient was given a MiraLax bowel prep to be completed 24 hours prior to her procedure.  The patient verbalized understanding of these risks and instructions, and has consented to proceed with a robotically-assisted total hysterectomy at [facility] on February 22, 2011.     Brigg Cape J. Cabell Lazenby, P.A.-C   ______________________________ Sandra A. Rivard,  M.D.    EJP/MEDQ  D:  02/16/2011  T:  02/17/2011  Job:  [id number] 

## 2011-02-21 MED ORDER — CEFOTETAN DISODIUM 2 G IJ SOLR
2.0000 g | INTRAMUSCULAR | Status: AC
  Administered 2011-02-22: 2 g via INTRAVENOUS
  Filled 2011-02-21: qty 2

## 2011-02-22 ENCOUNTER — Ambulatory Visit (HOSPITAL_COMMUNITY): Payer: BC Managed Care – PPO | Admitting: Anesthesiology

## 2011-02-22 ENCOUNTER — Encounter (HOSPITAL_COMMUNITY): Payer: Self-pay | Admitting: Anesthesiology

## 2011-02-22 ENCOUNTER — Encounter (HOSPITAL_COMMUNITY)
Admission: RE | Disposition: A | Discharge status: Home / Self Care | Payer: Self-pay | Source: Ambulatory Visit | Attending: Obstetrics and Gynecology

## 2011-02-22 ENCOUNTER — Ambulatory Visit (HOSPITAL_COMMUNITY)
Admission: RE | Admit: 2011-02-22 | Discharge: 2011-02-22 | Disposition: A | Discharge status: Home / Self Care | Payer: BC Managed Care – PPO | Source: Ambulatory Visit | Attending: Obstetrics and Gynecology | Admitting: Obstetrics and Gynecology

## 2011-02-22 ENCOUNTER — Other Ambulatory Visit: Payer: Self-pay | Admitting: Obstetrics and Gynecology

## 2011-02-22 DIAGNOSIS — N8000 Endometriosis of the uterus, unspecified: Secondary | ICD-10-CM | POA: Insufficient documentation

## 2011-02-22 DIAGNOSIS — N840 Polyp of corpus uteri: Secondary | ICD-10-CM | POA: Insufficient documentation

## 2011-02-22 DIAGNOSIS — D251 Intramural leiomyoma of uterus: Secondary | ICD-10-CM | POA: Insufficient documentation

## 2011-02-22 DIAGNOSIS — N946 Dysmenorrhea, unspecified: Secondary | ICD-10-CM | POA: Insufficient documentation

## 2011-02-22 DIAGNOSIS — D252 Subserosal leiomyoma of uterus: Secondary | ICD-10-CM | POA: Insufficient documentation

## 2011-02-22 DIAGNOSIS — N920 Excessive and frequent menstruation with regular cycle: Secondary | ICD-10-CM | POA: Diagnosis present

## 2011-02-22 DIAGNOSIS — D649 Anemia, unspecified: Secondary | ICD-10-CM

## 2011-02-22 LAB — PREGNANCY, URINE: Preg Test, Ur: NEGATIVE

## 2011-02-22 SURGERY — ROBOTIC ASSISTED TOTAL HYSTERECTOMY
Anesthesia: General | Wound class: Clean Contaminated

## 2011-02-22 MED ORDER — FENTANYL CITRATE 0.05 MG/ML IJ SOLN
INTRAMUSCULAR | Status: AC
  Filled 2011-02-22: qty 2

## 2011-02-22 MED ORDER — LACTATED RINGERS IV SOLN
INTRAVENOUS | Status: DC
  Administered 2011-02-22 (×3): via INTRAVENOUS

## 2011-02-22 MED ORDER — LIDOCAINE HCL (CARDIAC) 20 MG/ML IV SOLN
INTRAVENOUS | Status: DC | PRN
  Administered 2011-02-22: 60 mg via INTRAVENOUS

## 2011-02-22 MED ORDER — PROPOFOL 10 MG/ML IV EMUL
INTRAVENOUS | Status: AC
  Filled 2011-02-22: qty 40

## 2011-02-22 MED ORDER — SCOPOLAMINE 1 MG/3DAYS TD PT72
MEDICATED_PATCH | TRANSDERMAL | Status: AC
  Administered 2011-02-22: 1.5 mg via TRANSDERMAL
  Filled 2011-02-22: qty 1

## 2011-02-22 MED ORDER — GLYCOPYRROLATE 0.2 MG/ML IJ SOLN
INTRAMUSCULAR | Status: DC | PRN
  Administered 2011-02-22: .4 mg via INTRAVENOUS

## 2011-02-22 MED ORDER — SUFENTANIL CITRATE 50 MCG/ML IV SOLN
INTRAVENOUS | Status: AC
  Filled 2011-02-22: qty 1

## 2011-02-22 MED ORDER — MIDAZOLAM HCL 5 MG/5ML IJ SOLN
INTRAMUSCULAR | Status: DC | PRN
  Administered 2011-02-22: 2 mg via INTRAVENOUS

## 2011-02-22 MED ORDER — SCOPOLAMINE 1 MG/3DAYS TD PT72
1.0000 | MEDICATED_PATCH | Freq: Once | TRANSDERMAL | Status: DC
  Administered 2011-02-22: 1.5 mg via TRANSDERMAL

## 2011-02-22 MED ORDER — ROCURONIUM BROMIDE 50 MG/5ML IV SOLN
INTRAVENOUS | Status: AC
  Filled 2011-02-22: qty 1

## 2011-02-22 MED ORDER — FENTANYL CITRATE 0.05 MG/ML IJ SOLN
INTRAMUSCULAR | Status: DC | PRN
  Administered 2011-02-22: 100 ug via INTRAVENOUS

## 2011-02-22 MED ORDER — GLYCOPYRROLATE 0.2 MG/ML IJ SOLN
INTRAMUSCULAR | Status: AC
  Filled 2011-02-22: qty 2

## 2011-02-22 MED ORDER — METOCLOPRAMIDE HCL 5 MG/ML IJ SOLN: 10.0000 mg | Freq: Once | INTRAMUSCULAR | Status: DC | PRN

## 2011-02-22 MED ORDER — MIDAZOLAM HCL 2 MG/2ML IJ SOLN
INTRAMUSCULAR | Status: AC
  Filled 2011-02-22: qty 2

## 2011-02-22 MED ORDER — ONDANSETRON HCL 4 MG/2ML IJ SOLN
INTRAMUSCULAR | Status: DC | PRN
  Administered 2011-02-22: 4 mg via INTRAVENOUS

## 2011-02-22 MED ORDER — ROCURONIUM BROMIDE 100 MG/10ML IV SOLN
INTRAVENOUS | Status: DC | PRN
  Administered 2011-02-22: 50 mg via INTRAVENOUS
  Administered 2011-02-22 (×3): 10 mg via INTRAVENOUS

## 2011-02-22 MED ORDER — IBUPROFEN 600 MG PO TABS: 600.0000 mg | ORAL_TABLET | Freq: Four times a day (QID) | ORAL | Status: DC | PRN

## 2011-02-22 MED ORDER — FENTANYL CITRATE 0.05 MG/ML IJ SOLN
25.0000 ug | INTRAMUSCULAR | Status: DC | PRN
  Administered 2011-02-22: 50 ug via INTRAVENOUS

## 2011-02-22 MED ORDER — OXYCODONE-ACETAMINOPHEN 5-500 MG PO CAPS: 1.0000 | ORAL_CAPSULE | ORAL | Status: DC | PRN

## 2011-02-22 MED ORDER — SUFENTANIL CITRATE 50 MCG/ML IV SOLN
INTRAVENOUS | Status: DC | PRN
  Administered 2011-02-22: 20 ug via INTRAVENOUS
  Administered 2011-02-22 (×3): 10 ug via INTRAVENOUS

## 2011-02-22 MED ORDER — DEXAMETHASONE SODIUM PHOSPHATE 10 MG/ML IJ SOLN
INTRAMUSCULAR | Status: DC | PRN
  Administered 2011-02-22: 10 mg via INTRAVENOUS

## 2011-02-22 MED ORDER — BUPIVACAINE HCL (PF) 0.25 % IJ SOLN
INTRAMUSCULAR | Status: DC | PRN
  Administered 2011-02-22: 20 mL

## 2011-02-22 MED ORDER — IBUPROFEN 600 MG PO TABS: 600.0000 mg | ORAL_TABLET | Freq: Four times a day (QID) | ORAL | Status: AC | PRN

## 2011-02-22 MED ORDER — PROPOFOL 10 MG/ML IV EMUL
INTRAVENOUS | Status: DC | PRN
  Administered 2011-02-22: 50 mg via INTRAVENOUS
  Administered 2011-02-22: 180 mg via INTRAVENOUS
  Administered 2011-02-22 (×3): 50 mg via INTRAVENOUS

## 2011-02-22 MED ORDER — KETOROLAC TROMETHAMINE 60 MG/2ML IM SOLN
INTRAMUSCULAR | Status: DC | PRN
  Administered 2011-02-22: 30 mg via INTRAMUSCULAR

## 2011-02-22 MED ORDER — NEOSTIGMINE METHYLSULFATE 1 MG/ML IJ SOLN
INTRAMUSCULAR | Status: DC | PRN
  Administered 2011-02-22: 2 mg via INTRAVENOUS

## 2011-02-22 MED ORDER — DEXAMETHASONE SODIUM PHOSPHATE 10 MG/ML IJ SOLN
INTRAMUSCULAR | Status: AC
  Filled 2011-02-22: qty 1

## 2011-02-22 MED ORDER — MEPERIDINE HCL 25 MG/ML IJ SOLN: 6.2500 mg | INTRAMUSCULAR | Status: DC | PRN

## 2011-02-22 MED ORDER — STERILE WATER FOR IRRIGATION IR SOLN
Status: DC | PRN
  Administered 2011-02-22: 1

## 2011-02-22 MED ORDER — LIDOCAINE HCL (CARDIAC) 20 MG/ML IV SOLN
INTRAVENOUS | Status: AC
  Filled 2011-02-22: qty 5

## 2011-02-22 MED ORDER — KETOROLAC TROMETHAMINE 60 MG/2ML IM SOLN
INTRAMUSCULAR | Status: AC
  Filled 2011-02-22: qty 2

## 2011-02-22 MED ORDER — OXYCODONE-ACETAMINOPHEN 5-325 MG PO TABS: 1.0000 | ORAL_TABLET | ORAL | Status: DC | PRN

## 2011-02-22 MED ORDER — ONDANSETRON HCL 4 MG PO TABS: 4.0000 mg | ORAL_TABLET | Freq: Four times a day (QID) | ORAL | Status: DC | PRN

## 2011-02-22 MED ORDER — ONDANSETRON HCL 4 MG/2ML IJ SOLN
INTRAMUSCULAR | Status: AC
  Filled 2011-02-22: qty 2

## 2011-02-22 MED ORDER — ONDANSETRON HCL 4 MG/2ML IJ SOLN: 4.0000 mg | Freq: Four times a day (QID) | INTRAMUSCULAR | Status: DC | PRN

## 2011-02-22 MED ORDER — KETOROLAC TROMETHAMINE 30 MG/ML IJ SOLN
INTRAMUSCULAR | Status: DC | PRN
  Administered 2011-02-22: 30 mg via INTRAVENOUS

## 2011-02-22 MED ORDER — MENTHOL 3 MG MT LOZG: 1.0000 | LOZENGE | OROMUCOSAL | Status: DC | PRN

## 2011-02-22 MED ORDER — NEOSTIGMINE METHYLSULFATE 1 MG/ML IJ SOLN
INTRAMUSCULAR | Status: AC
  Filled 2011-02-22: qty 10

## 2011-02-22 MED ORDER — LACTATED RINGERS IR SOLN
Status: DC | PRN
  Administered 2011-02-22: 1

## 2011-02-22 MED ORDER — ARTIFICIAL TEARS OP OINT
TOPICAL_OINTMENT | OPHTHALMIC | Status: DC | PRN
  Administered 2011-02-22: 1 via OPHTHALMIC

## 2011-02-22 SURGICAL SUPPLY — 71 items
ADH SKN CLS APL DERMABOND .7 (GAUZE/BANDAGES/DRESSINGS) ×1
APL SKNCLS STERI-STRIP NONHPOA (GAUZE/BANDAGES/DRESSINGS) ×1
BAG URINE DRAINAGE (UROLOGICAL SUPPLIES) ×4 IMPLANT
BARRIER ADHS 3X4 INTERCEED (GAUZE/BANDAGES/DRESSINGS) ×2 IMPLANT
BENZOIN TINCTURE PRP APPL 2/3 (GAUZE/BANDAGES/DRESSINGS) ×2 IMPLANT
BLADELESS LONG 8MM (BLADE) ×2 IMPLANT
BRR ADH 4X3 ABS CNTRL BYND (GAUZE/BANDAGES/DRESSINGS) ×1
CABLE HIGH FREQUENCY MONO STRZ (ELECTRODE) ×2 IMPLANT
CATH FOLEY 3WAY  5CC 16FR (CATHETERS) ×2
CATH FOLEY 3WAY  5CC 18FR (CATHETERS) ×1
CATH FOLEY 3WAY 5CC 16FR (CATHETERS) ×2 IMPLANT
CATH FOLEY 3WAY 5CC 18FR (CATHETERS) ×1 IMPLANT
CHLORAPREP W/TINT 26ML (MISCELLANEOUS) ×2 IMPLANT
CLOTH BEACON ORANGE TIMEOUT ST (SAFETY) ×2 IMPLANT
CONT PATH 16OZ SNAP LID 3702 (MISCELLANEOUS) ×2 IMPLANT
CORDS BIPOLAR (ELECTRODE) IMPLANT
COVER MAYO STAND STRL (DRAPES) ×2 IMPLANT
COVER TABLE BACK 60X90 (DRAPES) ×4 IMPLANT
COVER TIP SHEARS 8 DVNC (MISCELLANEOUS) ×1 IMPLANT
COVER TIP SHEARS 8MM DA VINCI (MISCELLANEOUS) ×1
DECANTER SPIKE VIAL GLASS SM (MISCELLANEOUS) ×2 IMPLANT
DERMABOND ADVANCED (GAUZE/BANDAGES/DRESSINGS) ×1
DERMABOND ADVANCED .7 DNX12 (GAUZE/BANDAGES/DRESSINGS) ×1 IMPLANT
DRAPE HUG U DISPOSABLE (DRAPE) ×2 IMPLANT
DRAPE LG THREE QUARTER DISP (DRAPES) ×4 IMPLANT
DRAPE MONITOR DA VINCI (DRAPE) IMPLANT
DRAPE WARM FLUID 44X44 (DRAPE) ×2 IMPLANT
ELECT REM PT RETURN 9FT ADLT (ELECTROSURGICAL) ×2
ELECTRODE REM PT RTRN 9FT ADLT (ELECTROSURGICAL) ×1 IMPLANT
EVACUATOR SMOKE 8.L (FILTER) ×2 IMPLANT
GAUZE VASELINE 3X9 (GAUZE/BANDAGES/DRESSINGS) IMPLANT
GLOVE BIOGEL PI IND STRL 7.0 (GLOVE) ×3 IMPLANT
GLOVE BIOGEL PI INDICATOR 7.0 (GLOVE) ×3
GLOVE ECLIPSE 6.5 STRL STRAW (GLOVE) ×6 IMPLANT
GOWN PREVENTION PLUS LG XLONG (DISPOSABLE) ×8 IMPLANT
GRASPER BIPOLAR FEN DA VINCI (INSTRUMENTS)
GRASPER BPLR FEN DVNC (INSTRUMENTS) IMPLANT
KIT ACCESSORY DA VINCI DISP (KITS) ×1
KIT ACCESSORY DVNC DISP (KITS) ×1 IMPLANT
KIT DISP ACCESSORY 4 ARM (KITS) IMPLANT
NS IRRIG 1000ML POUR BTL (IV SOLUTION) ×6 IMPLANT
OCCLUDER COLPOPNEUMO (BALLOONS) IMPLANT
PACK LAVH (CUSTOM PROCEDURE TRAY) ×2 IMPLANT
PAD PREP 24X48 CUFFED NSTRL (MISCELLANEOUS) ×4 IMPLANT
PLUG CATH AND CAP STER (CATHETERS) ×2 IMPLANT
POSITIONER SURGICAL ARM (MISCELLANEOUS) ×4 IMPLANT
SET CYSTO W/LG BORE CLAMP LF (SET/KITS/TRAYS/PACK) IMPLANT
SET IRRIG TUBING LAPAROSCOPIC (IRRIGATION / IRRIGATOR) ×2 IMPLANT
SOLUTION ELECTROLUBE (MISCELLANEOUS) ×2 IMPLANT
SPONGE LAP 18X18 X RAY DECT (DISPOSABLE) IMPLANT
STRIP CLOSURE SKIN 1/2X4 (GAUZE/BANDAGES/DRESSINGS) ×2 IMPLANT
SUT MNCRL AB 3-0 PS2 27 (SUTURE) IMPLANT
SUT VIC AB 0 CT1 27 (SUTURE) ×12
SUT VIC AB 0 CT1 27XBRD ANTBC (SUTURE) ×6 IMPLANT
SUT VIC AB 0 CT2 27 (SUTURE) IMPLANT
SUT VIC AB 2-0 CT2 27 (SUTURE) ×2 IMPLANT
SUT VICRYL 0 UR6 27IN ABS (SUTURE) ×4 IMPLANT
SYR 50ML LL SCALE MARK (SYRINGE) ×2 IMPLANT
SYSTEM CONVERTIBLE TROCAR (TROCAR) ×2 IMPLANT
TIP UTERINE 5.1X6CM LAV DISP (MISCELLANEOUS) IMPLANT
TIP UTERINE 6.7X10CM GRN DISP (MISCELLANEOUS) IMPLANT
TIP UTERINE 6.7X6CM WHT DISP (MISCELLANEOUS) IMPLANT
TIP UTERINE 6.7X8CM BLUE DISP (MISCELLANEOUS) IMPLANT
TOWEL OR 17X24 6PK STRL BLUE (TOWEL DISPOSABLE) ×6 IMPLANT
TROCAR 12M 150ML BLUNT (TROCAR) ×2 IMPLANT
TROCAR DISP BLADELESS 8 DVNC (TROCAR) ×1 IMPLANT
TROCAR DISP BLADELESS 8MM (TROCAR) ×1
TROCAR XCEL 12X100 BLDLESS (ENDOMECHANICALS) ×2 IMPLANT
TROCAR Z-THREAD BLADED 12X100M (TROCAR) IMPLANT
TUBING FILTER THERMOFLATOR (ELECTROSURGICAL) ×2 IMPLANT
WARMER LAPAROSCOPE (MISCELLANEOUS) ×2 IMPLANT

## 2011-02-22 NOTE — Transfer of Care (Signed)
Immediate Anesthesia Transfer of Care Note  Patient: [name]  Procedure(s) Performed:  ROBOTIC ASSISTED TOTAL HYSTERECTOMY  Patient Location: PACU  Anesthesia Type: General  Level of Consciousness: oriented and sedated  Airway & Oxygen Therapy: Patient Spontanous Breathing and Patient connected to nasal cannula oxygen  Post-op Assessment: Report given to PACU RN and Post -op Vital signs reviewed and stable  Post vital signs: stable  Complications: No apparent anesthesia complications

## 2011-02-22 NOTE — H&P (View-Only) (Signed)
[name] NO.:  [id number]  MEDICAL RECORD NO.:  [id number]  LOCATION:  [facility]                           [facility]ACILITY:  [facility]  PHYSICIAN:  Dois Davenport A. Rivard, M.D. DATE OF BIRTH:  Jun 08, 1965  DATE OF ADMISSION:  02/16/2011 DATE OF DISCHARGE:  02/16/2011                             HISTORY & PHYSICAL   HISTORY OF PRESENT ILLNESS:  [name] is a 45 year old single African American female, para 1-0-3-1 presenting for a robotically-assisted hysterectomy because of menorrhagia, dysmenorrhea, and adenomyosis.  The patient reports that she has always had a heavy menstrual periods, but over the past 5 years, those periods have increased in volume of flow and discomfort.  In November 2011, the patient experienced significant vaginal bleed to the degree that she decreased her hemoglobin to 6.3 and was transfused 2 units of packed red blood cells. Typically, the patient's menstrual flow may last from 3-10 days, during which time, she changes a tampon plus 2 pads on a hourly basis.  She reports significant clots accompanied by cramping that will extend to her lower back and legs rated at a 9/10 on a 10 point pain scale.  The patient is able However, to take ibuprofen 800 mg and fall asleep.  A pelvic ultrasound in November 2012 showed a uterus measuring 11.8 x 7.5 x 7.1 cm that was heterogeneous with areas of shadowing that resemble features associated with adenomyosis.  Both of the patient's ovaries appeared normal on that study.  The patient went on to say that she does have intermenstrual bleeding that may last for a day, but denies any dyspareunia, urinary frequency, urgency, changes in her bowel movements, or vaginitis symptoms.  The patient was placed on norethindrone 5 mg daily since her last periods which has ameliorated some of her pelvic discomfort and has resulted amenorrhea since her transfusion in November.  Given that, the  patient has had such a protracted  and disruptive history of menorrhagia And dysmenorrhea, she has decided to proceed with definitive therapy in the form of hysterectomy.  PAST MEDICAL HISTORY:  Gravida 4, para 1-0-3-1.  The patient had a spontaneous vaginal birth, weighing 8 pounds and 5 ounces.  She has a history of postpartum depression and hyperemesis.  GYN HISTORY:  Menarche 45 year old.  Last menstrual period January 31, 2011.  She uses abstinence for contraception.  She has undegone cryotherapy for an abnormal Pap smear in 1990; however, her Pap smears have been normal since that time.  She has a history of Human papillomavirus.  Her last normal Pap smear was in 2012.  MEDICAL HISTORY:  Positive for migraines, anemia, scoliosis, asthma, depression, and keloid formation.  SURGICAL HISTORY:  In 2009, bilateral inguinal hernia repair.  The patient denies any problems with anesthesia.  FAMILY HISTORY:  Cardiovascular disease, asthma, colon cancer, hypertension, deep vein thrombosis (mother), anemia, lupus, and diabetes mellitus.  HABITS:  She denies any alcohol, tobacco, or illicit drug use.  SOCIAL HISTORY:  The patient is single and she works for [employer] as a Holiday representative.  CURRENT MEDICATIONS: 1. Integra. 2. Iron twice daily. 3. Wellbutrin 150 mg XL daily. 4. Fluoxetine 40 mg daily. 5. Folic acid 1  mg daily. 6. Trazodone 50 mg daily. 7. Tramadol 50 mg every 6 hours as needed for pain. 8. Norethindrone 5 mg daily. 9. Ibuprofen 600 mg every 6 hours with food as needed.  The patient has no known drug allergies.  She does have sensitivity to latex and is allergic to BEE STINGS.  Denies any sensitivities to peanuts, soy, or shellfish.  REVIEW OF SYSTEMS:  The patient wears contact lenses, and occasionally will have nausea and lower extremity swelling with her  menstrual periods.  She denies any chest pain, shortness of breath, headache, vision changes, vomiting, diarrhea, skin rashes, myalgias, or  arthralgias and except as is mentioned in history of present illness.  The patient's review of systems is otherwise negative.  PHYSICAL EXAMINATION:  VITAL SIGNS:  Blood pressure 124/82, pulse is 84, temperature 98.4 degrees Fahrenheit orally, weight 251 pounds, height 6 feet tall, body mass index is 34. NECK:  Supple without masses.  There is no thyromegaly or cervical adenopathy. HEART:  Regular rate and rhythm. LUNGS:  Clear. BACK:  No CVA tenderness. ABDOMEN:  No tenderness, guarding, rebound or organomegaly. EXTREMITIES:  No clubbing, cyanosis, or edema. PELVIC:  EG BUS is normal.  Vagina is normal.  Cervix is nontender without lesions.  Uterus appears upper limits of normal size and is tender.  Adnexa without any palpable masses, but it is also tender.  IMPRESSION: 1. Menorrhagia. 2. Dysmenorrhea. 3. Adenomyosis.  DISPOSITION:  A discussion was held with the patient regarding indications for her procedures along with its risks, which include, but are not limited to reaction to anesthesia, damage to adjacent organs, infection, excessive bleeding, and the possibility of the need for an open abdominal incision.  The patient was further advised that she will experience transient postoperative facial edema that her hospital stay is expected to be to be 0-2 days, that the robotic approach requires more time than that of an open abdominal incision, and that she is expected to return to her usual activities in 2-3 weeks with the exception of intercourse, for which, she would need to wait 6 weeks. The patient was given a MiraLax bowel prep to be completed 24 hours prior to her procedure.  The patient verbalized understanding of these risks and instructions, and has consented to proceed with a robotically-assisted total hysterectomy at [facility] [place] on February 22, 2011.     [name] J. [name], P.A.-C   ______________________________ Crist Fat Rivard,  M.D.    EJP/MEDQ  D:  02/16/2011  T:  02/17/2011  Job:  [id number]

## 2011-02-22 NOTE — Brief Op Note (Signed)
02/22/2011  11:36 AM  PATIENT:  [name]  45 y.o. female  PRE-OPERATIVE DIAGNOSIS:  Menorrhagia; Dysmenorrhea; Adenomyosis  POST-OPERATIVE DIAGNOSIS:  Menorrhagia; Dysmenorrhea; Adenomyosis, fibroids  PROCEDURE: ROBOTIC ASSISTED TOTAL HYSTERECTOMY  SURGEON: Esmeralda Arthur, MD  ASSISTANTS: Hal Morales, MD   ANESTHESIA:   local and general  LOCAL MEDICATIONS USED:  MARCAINE 20 CC  SPECIMEN:  uterus  DISPOSITION OF SPECIMEN:  PATHOLOGY  COUNTS:  YES  ESTIMATED BLOOD LOSS: 100 cc  PATIENT DISPOSITION:  PACU - hemodynamically stable.   DICTATION #: [id number]    [id number] A MD 02/22/2011 11:36 AM

## 2011-02-22 NOTE — Op Note (Signed)
[name], [name] NO.:  [id number]  MEDICAL RECORD NO.:  [id number]  LOCATION:  [facility]                          [facility]ACILITY[facility]  PHYSICIAN:  Dois Davenport A. Aracelia Brinson, M.D. DATE OF BIRTH:  Nov 16, 1965  DATE OF PROCEDURE:  02/22/2011 DATE OF DISCHARGE:                              OPERATIVE REPORT   PREOPERATIVE DIAGNOSES:  Uterine fibroids with menorrhagia, possible adenomyosis.  POSTOPERATIVE DIAGNOSES:  Uterine fibroids with menorrhagia, possible adenomyosis.  ANESTHESIA:  General.  ANESTHESIOLOGIST:  Brayton Caves, MD.  PROCEDURE:  Robotically-assisted total hysterectomy.  SURGEON:  Crist Fat. Taryn Shellhammer, MD  ASSISTANT:  Hal Morales, MD.  ESTIMATED BLOOD LOSS:  100 mL.  PROCEDURE IN DETAIL:  After being informed of the planned procedure with possible complications including bleeding, infection, injury to other organs, such as bladder, bowels or ureters, as well as the possible need of a laparotomy, informed consent was obtained.  The patient was taken to OR #7, given general anesthesia with endotracheal intubation without any complication.  She was placed in the lithotomy position on a sticky mattress with knee-high sequential compressive devices.  Arms padded and tucked on each side.  She is prepped and draped in a sterile fashion and a Foley catheter was inserted in her bladder.  Pelvic exam reveals an enlarged uterus approximately 14 weeks in size with good mobility.  A weighted speculum is inserted in the vagina.  Anterior lip of the cervix is grasped with a tenaculum forceps and the uterus is easily sounded at 11 cm, which allows for easy placement of a #10 RUMI intrauterine manipulator with 3.5 KOH ring and a vaginal occluder.  The RUMI balloon is inflated with 10 mL of saline and the KOH ring is sutured to the cervix.  Retractors are then removed.  We utilized the previous supraumbilical incision of the patient, removed the lower portion of the  keloid, and bring down this incision sharply to the fascia.  The fascia is grasped with Kocher forceps, incised with Mayo scissors.  Peritoneum was entered bluntly.  A pursestring suture of 0 Vicryl was placed on the fascia and a 10-mm Hasson trocar is easily inserted in the abdominal cavity, held in place with the previously placed pursestring suture.  This allows for easy insufflation of a pneumoperitoneum using warmed CO2 at the maximum pressure of 15 mmHg.  A quick evaluation of the abdominopelvic cavity reveals a large mobile uterus, 2 normal adnexa, 2 normal ovaries, some mild grade 1-2 adhesions between the cecum and the right pelvic wall.  Normal liver.  Normal gallbladder.  Appendix is not visualized.  Trocar placement is decided. Each side is infiltrated with Marcaine 0.25 and we placed under direct visualization, a left 8-mm robotic trocar, right 8-mm robotic trocar, and patient side assisted 5-mm trocar.  The robot is side docked to the right and a PK forceps is inserted in arm #1, monopolar scissor in arm #2, preparation with docking is completed in 45 minutes.  Starting on the left side of the pelvis, we cauterized the round ligament and sectioned it, cauterized the utero-ovarian ligament and sectioned it, cauterized the tube and sectioned it.  This gives [place] access to the broad  ligament, which is then sharply incised anteriorly all the way to cross the KOH ring, which is easily identified.  Bladder is easily dissected down and dissection is confirmed with filling the bladder with 100 mL.  Lifting the uterus, we can now address the posterior sheath of the broad ligament and skeletonize the uterine vessels, easily visualizing the left ureter.  The vessels are now well skeletonized and with pressure applied to the KOH ring, we can cauterize the uterine artery in its ascending branch at the level of the KOH ring. Moving to the right side, we proceed in the same fashion,  cauterizing the right round ligament then sectioning it, cauterizing the right tube sectioning it, cauterizing the right utero-ovarian ligament sectioning it, entering the anterior broad ligament to join the previous anterior incision completing dissection of the bladder on that side.  The posterior sheath of the broad ligament is also dissected sharply keeping the right ureter under direct visualization in order to skeletonize the vascular bundle.  We do note large varicose veins on that side.  The uterine artery and its vein are cauterized in its ascending branch at the level of the KOH ring in multiple places.  We can now section that vascular bundle, inflate the vaginal occluder, section the left vascular bundle, and perform a 360 degree colpotomy with an open monopolar scissor freeing the uterus completely.  The uterus was delivered vaginally and the vaginal occluder is reinserted to maintain pneumoperitoneum.  Three 0 Vicryl suture are dropped in the abdominal cavity through the 12-mm port, recovered, and placed on the anterior abdominal wall.  We proceed then with systematic closure of the vaginal apex with figure-of-eight stitches of 0 Vicryl, which leads to a satisfactory hemostasis.  Irrigating profusely, we complete hemostasis on peritoneal edges using cauterization and confirmed 2 normal-looking ureters with good peristalsis and no dilatation.  All pedicles are systematically reviewed and hemostasis is deemed adequate.  Robot is undocked.  Using an 8-mm camera, we can now utilize the 12-mm port to remove all 3 needles that were left on the abdominal wall easily.  All trocars are then removed under direct visualization after evacuating the pneumoperitoneum.  The fascia of the 10-mm supraumbilical incision is closed with a pursestring suture of 0 Vicryl.  Skin of all the incision is closed with subcuticular suture of 3-0 Monocryl and Dermabond.  A vaginal exam to confirm  hemostasis at the vaginal vault reveals a left vaginal wall laceration measuring 3 cm, which is sutured with figure-of- eight stitches of 3-0 Vicryl.  Hemostasis is now adequate.  The vaginal cuff is also hemostatic.  Foley is removed.  Instruments and sponge count is complete x2.  Estimated blood loss is 100 mL.  The procedure is well tolerated by the patient, is taken to recovery room in a well and stable condition.  SPECIMEN:  Uterus weighing 367 g sent to Pathology.     Crist Fat Charleston Hankin, M.D.    SAR/MEDQ  D:  02/22/2011  T:  02/22/2011  Job:  [id number]

## 2011-02-22 NOTE — Addendum Note (Signed)
Addendum  created 02/22/11 1627 by Cephus Shelling   Modules edited:Notes Section

## 2011-02-22 NOTE — Interval H&P Note (Signed)
History and Physical Interval Note:  02/22/2011 7:07 AM  [name]  has presented today for surgery, with the diagnosis of Menorrhagia; Dysmenorrhea; Adenomyosis  The various methods of treatment have been discussed with the patient and family. After consideration of risks, benefits and other options for treatment, the patient has consented to  Procedure(s): ROBOTIC ASSISTED TOTAL HYSTERECTOMY as a surgical intervention .  The patients' history has been reviewed, patient examined, no change in status, stable for surgery.  I have reviewed the patients' chart and labs.  Questions were answered to the patient's satisfaction.     [name] A

## 2011-02-22 NOTE — Anesthesia Preprocedure Evaluation (Signed)
Anesthesia Evaluation    Airway Mallampati: II TM Distance: >3 FB Neck ROM: full    Dental No notable dental hx. (+) Teeth Intact   Pulmonary asthma ,  clear to auscultation  Pulmonary exam normal       Cardiovascular neg cardio ROS Normal    Neuro/Psych  Headaches, PSYCHIATRIC DISORDERS    GI/Hepatic negative GI ROS, Neg liver ROS,   Endo/Other  Negative Endocrine ROS  Renal/GU negative Renal ROS  Genitourinary negative   Musculoskeletal   Abdominal Normal abdominal exam  (+)   Peds  Hematology  (+) Blood dyscrasia, anemia ,   Anesthesia Other Findings   Reproductive/Obstetrics negative OB ROS                           Anesthesia Physical Anesthesia Plan  ASA: II  Anesthesia Plan: General ETT   Post-op Pain Management:    Induction:   Airway Management Planned:   Additional Equipment:   Intra-op Plan:   Post-operative Plan:   Informed Consent: I have reviewed the patients History and Physical, chart, labs and discussed the procedure including the risks, benefits and alternatives for the proposed anesthesia with the patient or authorized representative who has indicated his/her understanding and acceptance.     Plan Discussed with: CRNA, Surgeon and Anesthesiologist  Anesthesia Plan Comments:         Anesthesia Quick Evaluation

## 2011-02-22 NOTE — Anesthesia Postprocedure Evaluation (Signed)
Anesthesia Post-op Note  Patient: [name]  Procedure(s) Performed:  ROBOTIC ASSISTED TOTAL HYSTERECTOMY  Patient Location: [facility]  Anesthesia Type: General  Level of Consciousness: sedated  Airway and Oxygen Therapy: Patient Spontanous Breathing  Post-op Pain: mild  Post-op Assessment: Post-op Vital signs reviewed  Post-op Vital Signs: Reviewed and stable  Complications: No apparent anesthesia complications and unexpected post-op hospitalization

## 2011-02-22 NOTE — Anesthesia Postprocedure Evaluation (Signed)
Anesthesia Post Note  Patient: [name]  Procedure(s) Performed:  ROBOTIC ASSISTED TOTAL HYSTERECTOMY  Anesthesia type: GA  Patient location: PACU  Post pain: Pain level controlled  Post assessment: Post-op Vital signs reviewed  Last Vitals:  Filed Vitals:   02/22/11 1215  BP:   Pulse:   Temp:   Resp: 15    Post vital signs: Reviewed  Level of consciousness: sedated  Complications: No apparent anesthesia complications

## 2011-02-22 NOTE — Discharge Summary (Signed)
Physician Discharge Summary  Patient ID: [name] MRN: [id number] DOB/AGE: [birth year] 43 y.o.  Admit date: 02/22/2011 Discharge date: 02/22/2011  Admission Diagnoses: Menorrhagia, Dysmenorrhea, Adenomyosis  Discharge Diagnoses: Menorrhagia, Dysmenorrhea, Adenomyosis with anemia    Discharged Condition: good  Hospital Course:   Consults: none  Treatments: surgery: robotic assisted total hysterectomy  Disposition: Home or Self Care   Current Discharge Medication List    START taking these medications   Details  !! ibuprofen (ADVIL,MOTRIN) 600 MG tablet Take 1 tablet (600 mg total) by mouth every 6 (six) hours as needed for pain. Qty: 30 tablet, Refills: 1    oxyCODONE-acetaminophen (TYLOX) 5-500 MG per capsule Take 1 capsule by mouth every 4 (four) hours as needed for pain. Qty: 30 capsule, Refills: 0     !! - Potential duplicate medications found. Please discuss with provider.    CONTINUE these medications which have NOT CHANGED   Details  buPROPion (WELLBUTRIN XL) 150 MG 24 hr tablet Take 150 mg by mouth daily.      FLUoxetine (PROZAC) 40 MG capsule Take 40 mg by mouth daily.      polysaccharide iron (NIFEREX) 150 MG CAPS capsule Take 1 capsule (150 mg total) by mouth 2 (two) times daily. Take as directed for at least six weeks. Qty: 60 each, Refills: 2    folic acid (FOLVITE) 1 MG tablet Take 1 mg by mouth 2 (two) times daily.      !! ibuprofen (ADVIL,MOTRIN) 600 MG tablet Take 600 mg by mouth every 6 (six) hours as needed. pain     traZODone (DESYREL) 50 MG tablet Take 50 mg by mouth at bedtime.       !! - Potential duplicate medications found. Please discuss with provider.    STOP taking these medications     norethindrone (AYGESTIN) 5 MG tablet      traMADol (ULTRAM) 50 MG tablet          Signed: Anjanette Gilkey A, MD 02/22/2011, 11:43 AM

## 2011-11-21 ENCOUNTER — Encounter (HOSPITAL_COMMUNITY): Payer: Self-pay | Admitting: *Deleted

## 2011-11-21 ENCOUNTER — Emergency Department (HOSPITAL_COMMUNITY)
Admission: EM | Admit: 2011-11-21 | Discharge: 2011-11-21 | Disposition: A | Discharge status: Home / Self Care | Payer: Self-pay | Source: Home / Self Care | Attending: Family Medicine | Admitting: Family Medicine

## 2011-11-21 DIAGNOSIS — R220 Localized swelling, mass and lump, head: Secondary | ICD-10-CM

## 2011-11-21 DIAGNOSIS — K047 Periapical abscess without sinus: Secondary | ICD-10-CM

## 2011-11-21 MED ORDER — TRAMADOL HCL 50 MG PO TABS: 50.0000 mg | ORAL_TABLET | Freq: Four times a day (QID) | ORAL | Status: DC | PRN

## 2011-11-21 MED ORDER — AMOXICILLIN-POT CLAVULANATE 875-125 MG PO TABS: 1.0000 | ORAL_TABLET | Freq: Two times a day (BID) | ORAL | Status: DC

## 2011-11-21 NOTE — ED Notes (Signed)
Pt  Has  painfull   sollen  r  Side  Of  Face   Jaw  Area  -  The  Pt  Is  Sitting  Upright on the  Exam table she  Is speaking in  Complete  sentances  And appears  In no resp  Distress  Patent airway  Is   Present

## 2011-11-21 NOTE — ED Provider Notes (Signed)
History     CSN: [id number]  Arrival date & time 11/21/11  1217   First MD Initiated Contact with Patient 11/21/11 1341      Chief Complaint  Patient presents with  . Dental Pain    (Consider location/radiation/quality/duration/timing/severity/associated sxs/prior treatment) Patient is a 46 y.o. female presenting with tooth pain. The history is provided by the patient.  Dental PainPrimary symptoms do not include sore throat.  Additional symptoms include: facial swelling and ear pain. Additional symptoms do not include: trouble swallowing, hearing loss and nosebleeds.  left upper tooth pain for 2 days with facial swelling onset yesterday +Thermal sensitivity + fever - gingival bleeding Decreased foot intake, able to drink fluids  Past Medical History  Diagnosis Date  . Anemia   . Keloid   . Fibroid   . Depression 08/2010    psych assessment  . Anxiety   . Asthma     hx as child - no prob as adult - no inhaler  . Blood transfusion 01/22/11    transfusion 2 units at [facility]  . Headache     rx for imitrex - last one jan    Past Surgical History  Procedure Date  . Hernia repair 2009    umbilical  . Svd     x 1    No family history on file.  History  Substance Use Topics  . Smoking status: Never Smoker   . Smokeless tobacco: Never Used  . Alcohol Use: No    OB History    Grav Para Term Preterm Abortions TAB SAB Ect Mult Living   3 1 1  2 1 1   1       Review of Systems  Constitutional: Negative.   HENT: Positive for ear pain, facial swelling, neck pain, neck stiffness and dental problem. Negative for hearing loss, nosebleeds, congestion, sore throat, rhinorrhea, sneezing, trouble swallowing, postnasal drip, sinus pressure, tinnitus and ear discharge.   Respiratory: Negative.   Cardiovascular: Negative.   Skin: Negative.     Allergies  Latex  Home Medications   Current Outpatient Rx  Name Route Sig Dispense Refill  . AMOXICILLIN-POT CLAVULANATE 875-125  MG PO TABS Oral Take 1 tablet by mouth every 12 (twelve) hours. 20 tablet 0  . BUPROPION HCL ER (XL) 150 MG PO TB24 Oral Take 150 mg by mouth daily.      Marland Kitchen FLUOXETINE HCL 40 MG PO CAPS Oral Take 40 mg by mouth daily.      Marland Kitchen FOLIC ACID 1 MG PO TABS Oral Take 1 mg by mouth 2 (two) times daily.      . IBUPROFEN 600 MG PO TABS Oral Take 600 mg by mouth every 6 (six) hours as needed. pain     . OXYCODONE-ACETAMINOPHEN 5-500 MG PO CAPS Oral Take 1 capsule by mouth every 4 (four) hours as needed for pain. 30 capsule 0  . POLYSACCHARIDE IRON 150 MG PO CAPS Oral Take 1 capsule (150 mg total) by mouth 2 (two) times daily. Take as directed for at least six weeks. 60 each 2  . TRAMADOL HCL 50 MG PO TABS Oral Take 1 tablet (50 mg total) by mouth every 6 (six) hours as needed for pain. 15 tablet 0  . TRAZODONE HCL 50 MG PO TABS Oral Take 50 mg by mouth at bedtime.        BP 142/91  Pulse 97  Temp 98.7 F (37.1 C) (Oral)  Resp 20  SpO2 100%  LMP  01/31/2011  Physical Exam  Nursing note and vitals reviewed. Constitutional: She is oriented to person, place, and time. Vital signs are normal. She appears well-developed and well-nourished. She is active and cooperative.  HENT:  Head: Normocephalic.    Right Ear: Hearing, tympanic membrane, external ear and ear canal normal.  Left Ear: Hearing, tympanic membrane, external ear and ear canal normal.  Nose: Nose normal.  Mouth/Throat: Uvula is midline, oropharynx is clear and moist and mucous membranes are normal. Dental abscesses present. No oropharyngeal exudate, posterior oropharyngeal edema, posterior oropharyngeal erythema or tonsillar abscesses.       Erythematous fluctuant swelling to right face, tenderness upon palpation.  Eyes: Conjunctivae normal and EOM are normal. Pupils are equal, round, and reactive to light. No scleral icterus.  Neck: Trachea normal. Neck supple.  Cardiovascular: Normal rate, regular rhythm and normal heart sounds.     Pulmonary/Chest: Effort normal and breath sounds normal.  Neurological: She is alert and oriented to person, place, and time. No cranial nerve deficit or sensory deficit.  Skin: Skin is warm and dry.  Psychiatric: She has a normal mood and affect. Her speech is normal and behavior is normal. Judgment and thought content normal. Cognition and memory are normal.    ED Course  Procedures (including critical care time)  Labs Reviewed - No data to display No results found.   1. Dental abscess   2. Facial swelling       MDM  Take antibiotics as prescribed, follow up in 1-2 days with dentist for further evaluation and treatment.        Johnsie Kindred, NP 11/21/11 1408

## 2011-11-22 NOTE — ED Notes (Signed)
At 5pm, received call from pt, c/o she was itching all over and breaking out soon after having taken a dose of augmentin. Was advised to stop rx and to use benadryl 50 mg until I could consult provider. Spoke w Lannie Fields at 5:40, who provided w new Rx. Called patient, and have called to [facility] ([address] at pt request) Rx for Clindamycin 600 mg PO TID x 7 days, and for a 6 day prednisone dose pack. Spoke directly w pharmacist. Have also directed pt per authorization CC to use Pepcid OTC BID for syx along with the benadryl for her allergy syx

## 2011-11-24 NOTE — ED Provider Notes (Signed)
Medical screening examination/treatment/procedure(s) were performed by resident physician or non-physician practitioner and as supervising physician I was immediately available for consultation/collaboration.   Barkley Bruns MD.    Linna Hoff, MD 11/24/11 [phone]

## 2013-01-24 ENCOUNTER — Ambulatory Visit: Payer: Self-pay

## 2013-01-24 ENCOUNTER — Ambulatory Visit: Payer: Self-pay | Admitting: Family Medicine

## 2013-01-24 ENCOUNTER — Telehealth: Payer: Self-pay | Admitting: Radiology

## 2013-01-24 VITALS — BP 130/86 | HR 88 | Temp 98.7°F | Resp 16 | Ht 68.0 in | Wt 304.2 lb

## 2013-01-24 DIAGNOSIS — R0602 Shortness of breath: Secondary | ICD-10-CM

## 2013-01-24 DIAGNOSIS — R079 Chest pain, unspecified: Secondary | ICD-10-CM

## 2013-01-24 LAB — POCT CBC
Granulocyte percent: 58.6 %G (ref 37–80)
HCT, POC: 43.6 % (ref 37.7–47.9)
Hemoglobin: 13.1 g/dL (ref 12.2–16.2)
Lymph, poc: 2.3 (ref 0.6–3.4)
MCH, POC: 28.4 pg (ref 27–31.2)
MCHC: 30 g/dL — AB (ref 31.8–35.4)
MCV: 94.5 fL (ref 80–97)
MID (cbc): 0.6 (ref 0–0.9)
MPV: 9.1 fL (ref 0–99.8)
POC Granulocyte: 4 (ref 2–6.9)
POC LYMPH PERCENT: 33.2 %L (ref 10–50)
POC MID %: 8.2 %M (ref 0–12)
Platelet Count, POC: 317 10*3/uL (ref 142–424)
RBC: 4.61 M/uL (ref 4.04–5.48)
RDW, POC: 14.2 %
WBC: 6.9 10*3/uL (ref 4.6–10.2)

## 2013-01-24 LAB — D-DIMER, QUANTITATIVE: D-Dimer, Quant: 3.09 ug/mL-FEU — ABNORMAL HIGH (ref 0.00–0.48)

## 2013-01-24 MED ORDER — ALBUTEROL SULFATE (2.5 MG/3ML) 0.083% IN NEBU
2.5000 mg | INHALATION_SOLUTION | Freq: Once | RESPIRATORY_TRACT | Status: AC
  Administered 2013-01-24: 2.5 mg via RESPIRATORY_TRACT

## 2013-01-24 MED ORDER — ASPIRIN 81 MG PO CHEW: 324.0000 mg | CHEWABLE_TABLET | Freq: Once | ORAL | Status: DC

## 2013-01-24 MED ORDER — ALBUTEROL SULFATE HFA 108 (90 BASE) MCG/ACT IN AERS: 2.0000 | INHALATION_SPRAY | Freq: Four times a day (QID) | RESPIRATORY_TRACT | Status: DC | PRN

## 2013-01-24 MED ORDER — DOXYCYCLINE HYCLATE 100 MG PO TABS: 100.0000 mg | ORAL_TABLET | Freq: Two times a day (BID) | ORAL | Status: DC

## 2013-01-24 MED ORDER — PREDNISONE 20 MG PO TABS: ORAL_TABLET | ORAL | Status: DC

## 2013-01-24 NOTE — Telephone Encounter (Signed)
Patient has d dimer of 3.08 Dr Patsy Lager advised by assistant Amy Alfredo Bach

## 2013-01-24 NOTE — Patient Instructions (Signed)
Use the prednisone, albuterol, and doxycycline as directed.   Take an aspirin a day until you see cardiology.  We will get you a cardiology appointment and give you a call

## 2013-01-24 NOTE — Progress Notes (Addendum)
[facility] [address], [place] [address] [phone]  Date:  01/24/2013   Name:  [name]   DOB:  11/21/1965   MRN:  [id number]  PCP:  No primary provider on file.    Chief Complaint: Cough, Chest congestion, Laryngitis and Wheezing   History of Present Illness:  [name] is a 47 y.o. very pleasant female patient who presents with the following:  She is here today with illness.   She has been ill for a couple of weeks with chest congestion and intermittent wheezing.  She has used OTC medication but they have not helped her much.    She has noted sweats and chills.   She does feel that she is getting worse.   Cough is non-productive.  She has tried mucinex and other OTC medication  She did have asthma as a child but no sx as an adult.   Illness did start with sneezing, runny and stuffy nose but these sx are now resolved.  No GI symptoms.  She has noted a ST.   No sick contacts at home    She has a history of very serious menorrhagia requiring transfusion but has had a hysterectomy so this problem is resolved.    She is a non- smoker, no hormone use.  She has no history of a PE or DVT. No recent travel.  She does have intermittent swelling of both ankles and feet for about one year.  Recently more persistent.  However this is bilateral and not new.   She reports no recent travel or surgery/immobilization, no hemoptysis She does get SOB if she tries to lie flat.  However, this seems to be long- term for her.   No known history of CAD.  She did see cardlogy during pregnancy nearly 20 years ago due to irregular heart rhythm.  Never had a stress test  She has noted chest discomfort for the last 2 weeks.  Walking/ exertion makes it feel worse.  She will feel like there is a weight on her chest.  She will sometimes note pain in her right jaw along with the chest tightness.  She will sometimes feel sweaty when this happens There is family  history of HTN.  No history of MI.  Patient Active Problem List   Diagnosis Date Noted  . Menorrhagia with regular cycle 02/22/2011  . Anemia 02/22/2011    Past Medical History  Diagnosis Date  . Anemia   . Keloid   . Fibroid   . Depression 08/2010    psych assessment  . Anxiety   . Asthma     hx as child - no prob as adult - no inhaler  . Blood transfusion 01/22/11    transfusion 2 units at [facility]  . Headache(784.0)     rx for imitrex - last one jan    Past Surgical History  Procedure Laterality Date  . Hernia repair  2009    umbilical  . Svd      x 1    History  Substance Use Topics  . Smoking status: Never Smoker   . Smokeless tobacco: Never Used  . Alcohol Use: No    Family History  Problem Relation Age of Onset  . Heart disease Mother     Allergies  Allergen Reactions  . Augmentin [Amoxicillin-Pot Clavulanate] Hives and Itching  . Latex Hives    Local reaction (welts). Patient denies any wheezing or other reaction  with latex exposure    Medication list has been reviewed and updated.  Current Outpatient Prescriptions on File Prior to Visit  Medication Sig Dispense Refill  . amoxicillin-clavulanate (AUGMENTIN) 875-125 MG per tablet Take 1 tablet by mouth every 12 (twelve) hours.  20 tablet  0  . buPROPion (WELLBUTRIN XL) 150 MG 24 hr tablet Take 150 mg by mouth daily.        Marland Kitchen FLUoxetine (PROZAC) 40 MG capsule Take 40 mg by mouth daily.        . folic acid (FOLVITE) 1 MG tablet Take 1 mg by mouth 2 (two) times daily.        Marland Kitchen ibuprofen (ADVIL,MOTRIN) 600 MG tablet Take 600 mg by mouth every 6 (six) hours as needed. pain       . oxyCODONE-acetaminophen (TYLOX) 5-500 MG per capsule Take 1 capsule by mouth every 4 (four) hours as needed for pain.  30 capsule  0  . polysaccharide iron (NIFEREX) 150 MG CAPS capsule Take 1 capsule (150 mg total) by mouth 2 (two) times daily. Take as directed for at least six weeks.  60 each  2  . traMADol (ULTRAM) 50 MG tablet  Take 1 tablet (50 mg total) by mouth every 6 (six) hours as needed for pain.  15 tablet  0  . traZODone (DESYREL) 50 MG tablet Take 50 mg by mouth at bedtime.         No current facility-administered medications on file prior to visit.    Review of Systems:  As per HPI- otherwise negative.   Physical Examination: Filed Vitals:   01/24/13 1221  BP: 142/86  Pulse: 98  Temp: 98.7 F (37.1 C)  Resp: 16   Filed Vitals:   01/24/13 1221  Height: 5\' 8"  (1.727 m)  Weight: 304 lb 3.2 oz (137.984 kg)   Body mass index is 46.26 kg/(m^2). Ideal Body Weight: Weight in (lb) to have BMI = 25: 164.1  GEN: WDWN, NAD, Non-toxic, A & O x 3, obese, pleasant, looks well HEENT: Atraumatic, Normocephalic. Neck supple. No masses, No LAD.  Bilateral TM wnl, oropharynx normal.  PEERL,EOMI.   Ears and Nose: No external deformity. CV: RRR, No M/G/R. No JVD. No thrill. No extra heart sounds. PULM: CTA B, no wheezes, crackles, rhonchi. No retractions. No resp. distress. No accessory muscle use. ABD: S, NT, ND, +BS. No rebound. No HSM. EXTR: No c/c.  Bilateral pedal edema 1+. Confined to feet and ankles NEURO Normal gait.  PSYCH: Normally interactive. Conversant. Not depressed or anxious appearing.  Calm demeanor.   [facility] reading (PRIMARY) by  Dr. Patsy Lager. CXR: significant levoscoliosos, otherwise negative CHEST 2 VIEW  COMPARISON: 04/14/2010  FINDINGS: The cardiomediastinal silhouette is unchanged. No focal infiltrate or edema. No pleural effusion or pneumothorax. No acute osseous abnormality. Thoracolumbar scoliosis is again noted.  IMPRESSION: No active cardiopulmonary disease.  EKG:   SR with ST elevation likely c/w early repolorization  Results for orders placed in visit on 01/24/13  POCT CBC      Result Value Range   WBC 6.9  4.6 - 10.2 K/uL   Lymph, poc 2.3  0.6 - 3.4   POC LYMPH PERCENT 33.2  10 - 50 %L   MID (cbc) 0.6  0 - 0.9   POC MID % 8.2  0 - 12 %M   POC Granulocyte 4.0  2  - 6.9   Granulocyte percent 58.6  37 - 80 %G   RBC 4.61  4.04 -  5.48 M/uL   Hemoglobin 13.1  12.2 - 16.2 g/dL   HCT, POC 16.1  09.6 - 47.9 %   MCV 94.5  80 - 97 fL   MCH, POC 28.4  27 - 31.2 pg   MCHC 30.0 (*) 31.8 - 35.4 g/dL   RDW, POC 04.5     Platelet Count, POC 317  142 - 424 K/uL   MPV 9.1  0 - 99.8 fL   Given asa 81 #4  Given albuterol neb once: reported that it improved her sx "8.5 out of 10"  Assessment and Plan: SOB (shortness of breath) - Plan: D-dimer, quantitative, DG Chest 2 View, albuterol (PROVENTIL) (2.5 MG/3ML) 0.083% nebulizer solution 2.5 mg, POCT CBC, doxycycline (VIBRA-TABS) 100 MG tablet, albuterol (PROVENTIL HFA;VENTOLIN HFA) 108 (90 BASE) MCG/ACT inhaler, predniSONE (DELTASONE) 20 MG tablet, CANCELED: Brain natriuretic peptide  Chest pain - Plan: EKG 12-Lead, aspirin chewable tablet 324 mg  [place] is here today with chest tightness/wheezing and chest discomfort for a couple of weeks.  She feels that this is a respiratory issue similar to when she had asthma as a child.  She noted significant relief with an albuterol neb treatment in clinic.  Her BP and pulse came down following tx.  We had discussed and drew a BNP and D dimer prior to treatment, but then decided to cancel these tests.    Discussed her chest discomfort in detail. She is aware that her EKG is not completely normal.  Although her history is more consistent with a respiratory issue it is possible that she is having cardiac pain.  Offered to have her seen at the ER today.  However at this time she declines. We will have her see cardiology early next week for follow-up. We were able to schedule this appt for her prior to her leaving clinic.  In the meantime she will take a regular aspirin daily as a precaution. Canceled BNP due to expense and lack of any effusion/ pulm congestion on her CXR.    Treat with albuterol HFA, doxycycline and predisone for 3 days.   She is instructed to seek care right away if  she is getting worse or has any concerns.  She may call, RTC or go to the ER if she needs help   Anemia of menorrhagia is now resolved.   Signed Abbe Amsterdam, MD   D dimer was canceled as we decided she is low- risk for a PE.  Had D dimer drawn when we did labs in case needed to avoid redrawing for critical frozen tube.  However D dimer did get sent to lab- came back elevated at 3.09.  Called and discussed with her; a D dimer is helpful to rule- out a DVT or PE- both are potentially life threatening.  A positive d dimer is not specific for PE or DVT.  At this time her Well's criteria is 0 and we feel she is low risk for PE.  However I am glad to arrange a CT scan for her now if she would like to rule- out PE.  At this time she declines a CT but will follow-up closely fi she does not continue to feel better.

## 2013-01-28 NOTE — Telephone Encounter (Signed)
Dr Patsy Lager has discussed with patient.

## 2013-01-29 ENCOUNTER — Encounter: Payer: Self-pay | Admitting: Cardiology

## 2013-01-29 ENCOUNTER — Ambulatory Visit (INDEPENDENT_AMBULATORY_CARE_PROVIDER_SITE_OTHER): Payer: Self-pay | Admitting: Cardiology

## 2013-01-29 VITALS — BP 134/94 | HR 90 | Ht 72.0 in | Wt 313.8 lb

## 2013-01-29 DIAGNOSIS — R0609 Other forms of dyspnea: Secondary | ICD-10-CM

## 2013-01-29 DIAGNOSIS — R0989 Other specified symptoms and signs involving the circulatory and respiratory systems: Secondary | ICD-10-CM

## 2013-01-29 DIAGNOSIS — R9431 Abnormal electrocardiogram [ECG] [EKG]: Secondary | ICD-10-CM

## 2013-01-29 DIAGNOSIS — R0600 Dyspnea, unspecified: Secondary | ICD-10-CM | POA: Insufficient documentation

## 2013-01-29 NOTE — Progress Notes (Signed)
[name] Date of Birth: May 22, 1965 Medical Record #[id number]  History of Present Illness: [name] is seen at the request of Dr. Patsy Lager for evaluation of dyspnea and abnormal Ecg. She is a pleasant 47 year old black female with history of obesity. She presents with a six-week history of dyspnea. This is associated with symptoms of chest discomfort like 2 elephants sitting on her chest. She has also had some swelling in her legs and feet. She had minimal cough. She tried taking Mucinex without improvement. She states she got a breathing treatment and this helped tremendously. She has since been on albuterol, steroids, and antibiotics and is feeling much better now. She does still have some mild dyspnea and swelling in her ankles. Her only prior cardiac history he was an irregular heartbeat that she had when she was pregnant 19 years ago. This resolved with completion of her pregnancy. She does have a very strong family history of hypertension. The patient has no history of tobacco use, hypertension, hyperlipidemia, or diabetes.   Current Outpatient Prescriptions on File Prior to Visit  Medication Sig Dispense Refill  . albuterol (PROVENTIL HFA;VENTOLIN HFA) 108 (90 BASE) MCG/ACT inhaler Inhale 2 puffs into the lungs every 6 (six) hours as needed for wheezing or shortness of breath.  1 Inhaler  0  . doxycycline (VIBRA-TABS) 100 MG tablet Take 1 tablet (100 mg total) by mouth 2 (two) times daily.  20 tablet  0   Current Facility-Administered Medications on File Prior to Visit  Medication Dose Route Frequency Provider Last Rate Last Dose  . aspirin chewable tablet 324 mg  324 mg Oral Once Pearline Cables, MD        Allergies  Allergen Reactions  . Augmentin [Amoxicillin-Pot Clavulanate] Hives and Itching  . Latex Hives    Local reaction (welts). Patient denies any wheezing or other reaction with latex exposure    Past Medical History  Diagnosis Date  . Anemia   . Keloid   .  Fibroid   . Depression 08/2010    psych assessment  . Anxiety   . Asthma     hx as child - no prob as adult - no inhaler  . Blood transfusion 01/22/11    transfusion 2 units at [facility]  . Headache(784.0)     rx for imitrex - last one jan  . Dyspnea     Past Surgical History  Procedure Laterality Date  . Hernia repair  2009    umbilical  . Svd      x 1    History  Smoking status  . Never Smoker   Smokeless tobacco  . Never Used    History  Alcohol Use No    Family History  Problem Relation Age of Onset  . Heart disease Mother   . Hypertension Mother   . Hypertension Father   . Hypertension Sister     Review of Systems: As noted in history of present illness.  All other systems were reviewed and are negative.  Physical Exam: BP 134/94  Pulse 90  Ht 6' (1.829 m)  Wt 313 lb 12.8 oz (142.339 kg)  BMI 42.55 kg/m2  LMP 01/31/2011 She is a very pleasant, obese black female in no acute distress. HEENT: Normocephalic, atraumatic. Pupils equal round and reactive to light accommodation. Extraocular movements are full. Oral pharynx is clear. Neck: No JVD, adenopathy, thyromegaly, or bruits. Lungs: Clear Cardiovascular: Regular rate and rhythm. Normal S1 and S2. No gallop, murmur, or click. Abdomen:  Obese, soft, nontender. Bowel sounds are positive. No masses. Extremities: 1+ bilateral ankle edema. Pedal pulses are palpable. Skin: Keloid formation in the left neck region. Neuro: Alert and oriented x3. Cranial nerves II through XII are intact.  LABORATORY DATA: Lab Results  Component Value Date   WBC 6.9 01/24/2013   HGB 13.1 01/24/2013   HCT 43.6 01/24/2013   PLT 426* 02/16/2011   GLUCOSE 95 02/16/2011   ALT 11 08/30/2010   AST 15 08/30/2010   NA 139 02/16/2011   K 3.6 02/16/2011   CL 106 02/16/2011   CREATININE 1.06 02/16/2011   BUN 8 02/16/2011   CO2 24 02/16/2011   ECG on 01/24/2013 shows normal sinus rhythm. There is mild ST elevation in leads V1 and V2 with  loss of R wave. These findings appear to be new compared to 2012.  Assessment / Plan: 1. Dyspnea. Given her response to recent treatment it suggests an this is more related to asthma/bronchitis. However she does have lower extremity edema and some ECG changes compared to 2012. I think it's important to assess her cardiac function. We will schedule her for an echocardiogram. If her echocardiogram is unremarkable I would continue with her asthma treatment. I think it is very unlikely her symptoms are ischemic given her lack of risk factors.

## 2013-01-29 NOTE — Patient Instructions (Signed)
We will schedule you for an Echocardiogram   

## 2013-01-30 ENCOUNTER — Encounter: Payer: Self-pay | Admitting: Cardiology

## 2013-01-30 ENCOUNTER — Ambulatory Visit (HOSPITAL_COMMUNITY): Payer: Self-pay | Attending: Cardiology | Admitting: Radiology

## 2013-01-30 DIAGNOSIS — R0989 Other specified symptoms and signs involving the circulatory and respiratory systems: Secondary | ICD-10-CM | POA: Insufficient documentation

## 2013-01-30 DIAGNOSIS — R609 Edema, unspecified: Secondary | ICD-10-CM | POA: Insufficient documentation

## 2013-01-30 DIAGNOSIS — E669 Obesity, unspecified: Secondary | ICD-10-CM | POA: Insufficient documentation

## 2013-01-30 DIAGNOSIS — R079 Chest pain, unspecified: Secondary | ICD-10-CM | POA: Insufficient documentation

## 2013-01-30 DIAGNOSIS — R9431 Abnormal electrocardiogram [ECG] [EKG]: Secondary | ICD-10-CM

## 2013-01-30 DIAGNOSIS — Z6841 Body Mass Index (BMI) 40.0 and over, adult: Secondary | ICD-10-CM | POA: Insufficient documentation

## 2013-01-30 DIAGNOSIS — R0600 Dyspnea, unspecified: Secondary | ICD-10-CM

## 2013-01-30 DIAGNOSIS — R0609 Other forms of dyspnea: Secondary | ICD-10-CM | POA: Insufficient documentation

## 2013-01-30 NOTE — Progress Notes (Signed)
Echocardiogram performed.  

## 2013-01-31 ENCOUNTER — Telehealth: Payer: Self-pay | Admitting: Cardiology

## 2013-01-31 NOTE — Telephone Encounter (Signed)
Patient is returning your call; Please call patient on cell phone °

## 2013-01-31 NOTE — Telephone Encounter (Signed)
Returned call to patient echo results given. 

## 2013-02-06 ENCOUNTER — Telehealth: Payer: Self-pay | Admitting: Family Medicine

## 2013-02-06 DIAGNOSIS — R0602 Shortness of breath: Secondary | ICD-10-CM

## 2013-02-06 MED ORDER — ALBUTEROL SULFATE HFA 108 (90 BASE) MCG/ACT IN AERS: 2.0000 | INHALATION_SPRAY | Freq: Four times a day (QID) | RESPIRATORY_TRACT | Status: DC | PRN

## 2013-02-06 NOTE — Telephone Encounter (Signed)
Called her- see note from Amy Littrell to day.  Pt received a bill from the D dimer that was canceled.  We are going to "doctor bill" this but she is to let us know if the bill does not go away.  We are sorry for this hassle for her She notes that she does need her albuterol a bit more lately.  She thinks this is due to the change in weather and is not in any distress.  She would like a rf of her inhaler and I will do this for her.  counseled her that if her albuterol needs to not come back to baseline she may need a controlled med such as an inhaled steroid.

## 2013-02-10 ENCOUNTER — Telehealth: Payer: Self-pay

## 2013-02-10 NOTE — Telephone Encounter (Signed)
Called [facility] and spoke with Martie Lee, doctored bill D-Dimer per Dr. Patsy Lager

## 2013-05-19 ENCOUNTER — Ambulatory Visit: Payer: Self-pay | Admitting: Physician Assistant

## 2013-05-19 VITALS — BP 132/82 | HR 119 | Temp 98.6°F | Resp 17 | Ht 71.0 in | Wt 308.0 lb

## 2013-05-19 DIAGNOSIS — Z111 Encounter for screening for respiratory tuberculosis: Secondary | ICD-10-CM

## 2013-05-19 NOTE — Progress Notes (Signed)
  Tuberculosis Risk Questionnaire  1. no  Were you born outside the [place] in one of the following parts of the world: [place], [place], [place], [place] or [place]?    2. no  Have you traveled outside the [place] and lived for more than one month in one of the following parts of the world: [place], [place], [place], [place] or [place]?    3. No Do you have a compromised immune system such as from any of the following conditions:HIV/AIDS, organ or bone marrow transplantation, diabetes, immunosuppressive medicines (e.g. Prednisone, Remicaide), leukemia, lymphoma, cancer of the head or neck, gastrectomy or jejunal bypass, end-stage renal disease (on dialysis), or silicosis?     4. Yes  Have you ever or do you plan on working in: a residential care center, a health care facility, a jail or prison or homeless shelter?    5. No Have you ever: injected illegal drugs, used crack cocaine, lived in a homeless shelter  or been in jail or prison?     6. No Have you ever been exposed to anyone with infectious tuberculosis?    Tuberculosis Symptom Questionnaire  Do you currently have any of the following symptoms?  1. No Unexplained cough lasting more than 3 weeks?   2. No Unexplained fever lasting more than 3 weeks.   3. No Night Sweats (sweating that leaves the bedclothes and sheets wet)     4. No Shortness of Breath   5. No Chest Pain   6. No Unintentional weight loss    7. No Unexplained fatigue (very tired for no reason)

## 2013-05-22 ENCOUNTER — Ambulatory Visit (INDEPENDENT_AMBULATORY_CARE_PROVIDER_SITE_OTHER): Payer: Self-pay | Admitting: Physician Assistant

## 2013-05-22 DIAGNOSIS — Z111 Encounter for screening for respiratory tuberculosis: Secondary | ICD-10-CM

## 2013-05-22 LAB — TB SKIN TEST
Induration: 0 mm
TB Skin Test: NEGATIVE

## 2013-05-22 NOTE — Progress Notes (Signed)
Patient here for nursing visit only.  The patient was not seen by a provider.  Here for TB read only.

## 2015-10-06 ENCOUNTER — Emergency Department (HOSPITAL_COMMUNITY)
Admission: EM | Admit: 2015-10-06 | Discharge: 2015-10-06 | Disposition: A | Discharge status: Home / Self Care | Payer: Self-pay | Attending: Emergency Medicine | Admitting: Emergency Medicine

## 2015-10-06 ENCOUNTER — Encounter (HOSPITAL_COMMUNITY): Payer: Self-pay | Admitting: *Deleted

## 2015-10-06 DIAGNOSIS — J0100 Acute maxillary sinusitis, unspecified: Secondary | ICD-10-CM | POA: Insufficient documentation

## 2015-10-06 DIAGNOSIS — Z7982 Long term (current) use of aspirin: Secondary | ICD-10-CM | POA: Insufficient documentation

## 2015-10-06 DIAGNOSIS — Z91040 Latex allergy status: Secondary | ICD-10-CM | POA: Insufficient documentation

## 2015-10-06 DIAGNOSIS — J45909 Unspecified asthma, uncomplicated: Secondary | ICD-10-CM | POA: Insufficient documentation

## 2015-10-06 MED ORDER — DOXYCYCLINE HYCLATE 100 MG PO CAPS
100.0000 mg | ORAL_CAPSULE | Freq: Two times a day (BID) | ORAL | 0 refills | Status: DC
Start: 2015-10-06 — End: 2015-12-26

## 2015-10-06 MED ORDER — PSEUDOEPHEDRINE HCL 60 MG PO TABS: 60.0000 mg | ORAL_TABLET | Freq: Four times a day (QID) | ORAL | 0 refills | Status: DC | PRN

## 2015-10-06 MED ORDER — OXYMETAZOLINE HCL 0.05 % NA SOLN: 1.0000 | Freq: Two times a day (BID) | NASAL | 0 refills | Status: DC

## 2015-10-06 NOTE — ED Provider Notes (Signed)
[facility] DEPT Provider Note   CSN: [id number] Arrival date & time: 10/06/15  1405  First Provider Contact:  None    By signing my name below, I, Higinio Plan, attest that this documentation has been prepared under the direction and in the presence of non-physician practitioner, Harlene Ramus, PA-C. Electronically Signed: Higinio Plan, Scribe. 10/06/2015. 3:12 PM.  History   Chief Complaint Chief Complaint  Patient presents with  . Facial Swelling   The history is provided by the patient. No language interpreter was used.   HPI Comments: [name] is a 50 y.o. female who presents to the Emergency Department complaining of gradually worsening, right sided facial swelling and congestion that began 2 days ago and worsened this morning. Pt reports associated bilateral sinus pressure that is worse on her right side, ear congestion, mild sore throat and postnasal drip. She states she took Mucinex and 350 mg Tylenol 2 days ago with no relief. Pt denies fever, HA, rhinorrhea, difficulty swallowing, productive cough, shortness of breath, chest pain, new neck stiffness or rash. She also denies any drainage in her mouth from her teeth.   Past Medical History:  Diagnosis Date  . Anemia   . Anxiety   . Asthma    hx as child - no prob as adult - no inhaler  . Blood transfusion 01/22/11   transfusion 2 units at [facility]  . Depression 08/2010   psych assessment  . Dyspnea   . Fibroid   . Headache(784.0)    rx for imitrex - last one jan  . Keloid     Patient Active Problem List   Diagnosis Date Noted  . Abnormal ECG 01/29/2013  . Dyspnea   . Menorrhagia with regular cycle 02/22/2011  . Anemia 02/22/2011    Past Surgical History:  Procedure Laterality Date  . HERNIA REPAIR  123XX123   umbilical  . SVD     x 1    OB History    Gravida Para Term Preterm AB Living   3 1 1   2 1    SAB TAB Ectopic Multiple Live Births   1 1           Home Medications    Prior to Admission  medications   Medication Sig Start Date End Date Taking? Authorizing Provider  doxycycline (VIBRAMYCIN) 100 MG capsule Take 1 capsule (100 mg total) by mouth 2 (two) times daily. 10/06/15   Nona Dell, PA-C  oxymetazoline (AFRIN NASAL SPRAY) 0.05 % nasal spray Place 1 spray into both nostrils 2 (two) times daily. 10/06/15   Nona Dell, PA-C  pseudoephedrine (SUDAFED) 60 MG tablet Take 1 tablet (60 mg total) by mouth every 6 (six) hours as needed for congestion. 10/06/15   Nona Dell, PA-C    Family History Family History  Problem Relation Age of Onset  . Heart disease Mother   . Hypertension Mother   . Hypertension Father   . Hypertension Sister     Social History Social History  Substance Use Topics  . Smoking status: Never Smoker  . Smokeless tobacco: Never Used  . Alcohol use No   Allergies   Augmentin [amoxicillin-pot clavulanate] and Latex   Review of Systems Review of Systems  Constitutional: Negative for fever.  HENT: Positive for congestion, ear pain, facial swelling (right sided), sinus pressure and sore throat. Negative for rhinorrhea and trouble swallowing.   Respiratory: Negative for cough and shortness of breath.   Cardiovascular: Negative for chest  pain.  Musculoskeletal: Negative for neck stiffness.  Skin: Negative for rash.   Physical Exam Updated Vital Signs BP 136/94 (BP Location: Left Arm)   Pulse 115   Temp 99.3 F (37.4 C) (Oral)   Resp 17   Ht 6' (1.829 m)   Wt 260 lb (117.9 kg)   LMP 01/31/2011   SpO2 97%   BMI 35.26 kg/m   Physical Exam  Constitutional: She is oriented to person, place, and time. She appears well-developed and well-nourished.  HENT:  Head: Normocephalic and atraumatic.  Right Ear: A middle ear effusion is present.  Left Ear: A middle ear effusion is present.  Nose: No rhinorrhea, sinus tenderness or nasal deformity. Right sinus exhibits maxillary sinus tenderness and frontal sinus  tenderness. Left sinus exhibits maxillary sinus tenderness and frontal sinus tenderness.  Mouth/Throat: Uvula is midline, oropharynx is clear and moist and mucous membranes are normal. No trismus in the jaw. Abnormal dentition. No dental abscesses or uvula swelling. No oropharyngeal exudate, posterior oropharyngeal edema, posterior oropharyngeal erythema or tonsillar abscesses.  Mild swelling over right maxillary sinus   Eyes: Conjunctivae and EOM are normal. Pupils are equal, round, and reactive to light. Right eye exhibits no discharge. Left eye exhibits no discharge. No scleral icterus.  Neck: Normal range of motion. Neck supple.  Large keloid noted to right anterior neck which pt reports is unchanged   Cardiovascular: Normal rate and intact distal pulses.   Heart rate 92  Pulmonary/Chest: Effort normal and breath sounds normal. No respiratory distress. She has no wheezes. She has no rales. She exhibits no tenderness.  Abdominal: Soft. Bowel sounds are normal. There is no tenderness.  Musculoskeletal: She exhibits no edema.  Lymphadenopathy:    She has no cervical adenopathy.  Neurological: She is alert and oriented to person, place, and time.  Skin: Skin is warm and dry.  Nursing note and vitals reviewed.  ED Treatments / Results  Labs (all labs ordered are listed, but only abnormal results are displayed) Labs Reviewed - No data to display  EKG  EKG Interpretation None       Radiology No results found.  Procedures Procedures  DIAGNOSTIC STUDIES:  Oxygen Saturation is 97% on RA, normal by my interpretation.    COORDINATION OF CARE:  2:58 PM Discussed treatment plan with pt at bedside and pt agreed to plan.  Medications Ordered in ED Medications - No data to display  Initial Impression / Assessment and Plan / ED Course  I have reviewed the triage vital signs and the nursing notes.  Pertinent labs & imaging results that were available during my care of the patient  were reviewed by me and considered in my medical decision making (see chart for details).  Clinical Course    I personally performed the services described in this documentation, which was scribed in my presence. The recorded information has been reviewed and is accurate.   Final Clinical Impressions(s) / ED Diagnoses   Final diagnoses:  Acute maxillary sinusitis, recurrence not specified   Patient complaining of symptoms of sinusitis.    Moderate symptoms have been present for 3 days.  Concern for acute bacterial rhinosinusitis due to purulent nasal discharge, maxillary sinus pain and mild swelling over sinus.  Patient discharged with Augmentin.  Instructions given for warm saline nasal wash and recommendations for follow-up with primary care physician.     New Prescriptions Discharge Medication List as of 10/06/2015  3:03 PM    START taking these medications  Details  doxycycline (VIBRAMYCIN) 100 MG capsule Take 1 capsule (100 mg total) by mouth 2 (two) times daily., Starting Wed 10/06/2015, Print    oxymetazoline (AFRIN NASAL SPRAY) 0.05 % nasal spray Place 1 spray into both nostrils 2 (two) times daily., Starting Wed 10/06/2015, Print    pseudoephedrine (SUDAFED) 60 MG tablet Take 1 tablet (60 mg total) by mouth every 6 (six) hours as needed for congestion., Starting Wed 10/06/2015, Print         [place], [place] 10/06/15 [id number]    Veryl Speak, MD 10/06/15 [phone]

## 2015-10-06 NOTE — ED Triage Notes (Signed)
Pt reports ?sinus infection since Monday.  Reports R facial swelling, pain under her R eye.  Pt reports taking sinus med without relief.  Denies taking lisinopril for HTN.   Denies swelling in her lips or tongue at this time.

## 2015-10-06 NOTE — Discharge Instructions (Signed)
Take your medications as prescribed. You may also use warm saline nasal rinses daily to help with nasal congestion. You may apply nose spray into each nostril twice daily for the next 3 days. Do not use the nose spray for more than 3 days to prevent rebound effect. Please follow up with a primary care provider from the Resource Guide provided below in 4-5 days if your symptoms have not improved. Please return to the Emergency Department if symptoms worsen or new onset of fever, headache, neck stiffness, difficulty breathing, rash.

## 2015-12-26 ENCOUNTER — Emergency Department (HOSPITAL_COMMUNITY): Payer: Self-pay

## 2015-12-26 ENCOUNTER — Emergency Department (HOSPITAL_COMMUNITY)
Admission: EM | Admit: 2015-12-26 | Discharge: 2015-12-26 | Disposition: A | Discharge status: Home / Self Care | Payer: Self-pay | Attending: Emergency Medicine | Admitting: Emergency Medicine

## 2015-12-26 ENCOUNTER — Encounter (HOSPITAL_COMMUNITY): Payer: Self-pay

## 2015-12-26 DIAGNOSIS — Z7982 Long term (current) use of aspirin: Secondary | ICD-10-CM | POA: Insufficient documentation

## 2015-12-26 DIAGNOSIS — R5383 Other fatigue: Secondary | ICD-10-CM | POA: Insufficient documentation

## 2015-12-26 DIAGNOSIS — J45909 Unspecified asthma, uncomplicated: Secondary | ICD-10-CM | POA: Insufficient documentation

## 2015-12-26 DIAGNOSIS — Z91040 Latex allergy status: Secondary | ICD-10-CM | POA: Insufficient documentation

## 2015-12-26 DIAGNOSIS — Z79899 Other long term (current) drug therapy: Secondary | ICD-10-CM | POA: Insufficient documentation

## 2015-12-26 DIAGNOSIS — M25421 Effusion, right elbow: Secondary | ICD-10-CM | POA: Insufficient documentation

## 2015-12-26 LAB — COMPREHENSIVE METABOLIC PANEL
ALT: 14 U/L (ref 14–54)
AST: 17 U/L (ref 15–41)
Albumin: 4.4 g/dL (ref 3.5–5.0)
Alkaline Phosphatase: 63 U/L (ref 38–126)
Anion gap: 6 (ref 5–15)
BUN: 12 mg/dL (ref 6–20)
CO2: 25 mmol/L (ref 22–32)
Calcium: 9.9 mg/dL (ref 8.9–10.3)
Chloride: 107 mmol/L (ref 101–111)
Creatinine, Ser: 0.94 mg/dL (ref 0.44–1.00)
GFR calc Af Amer: >60 mL/min (ref >60)
GFR calc non Af Amer: >60 mL/min (ref >60)
Glucose, Bld: 99 mg/dL (ref 65–99)
Potassium: 3.3 mmol/L — ABNORMAL LOW (ref 3.5–5.1)
Sodium: 138 mmol/L (ref 135–145)
Total Bilirubin: 0.9 mg/dL (ref 0.3–1.2)
Total Protein: 8.8 g/dL — ABNORMAL HIGH (ref 6.5–8.1)

## 2015-12-26 LAB — URINALYSIS, ROUTINE W REFLEX MICROSCOPIC
Glucose, UA: NEGATIVE mg/dL
Hgb urine dipstick: NEGATIVE
Ketones, ur: NEGATIVE mg/dL
Nitrite: NEGATIVE
Protein, ur: 30 mg/dL — AB
Specific Gravity, Urine: 1.04 — ABNORMAL HIGH (ref 1.005–1.030)
pH: 6 (ref 5.0–8.0)

## 2015-12-26 LAB — CBC WITH DIFFERENTIAL/PLATELET
Basophils Absolute: 0.1 10*3/uL (ref 0.0–0.1)
Basophils Relative: 1 %
Eosinophils Absolute: 0.6 10*3/uL (ref 0.0–0.7)
Eosinophils Relative: 7 %
HCT: 40.1 % (ref 36.0–46.0)
Hemoglobin: 13.1 g/dL (ref 12.0–15.0)
Lymphocytes Relative: 27 %
Lymphs Abs: 2.3 10*3/uL (ref 0.7–4.0)
MCH: 29.4 pg (ref 26.0–34.0)
MCHC: 32.7 g/dL (ref 30.0–36.0)
MCV: 90.1 fL (ref 78.0–100.0)
Monocytes Absolute: 0.5 10*3/uL (ref 0.1–1.0)
Monocytes Relative: 6 %
Neutro Abs: 4.9 10*3/uL (ref 1.7–7.7)
Neutrophils Relative %: 59 %
Platelets: 390 10*3/uL (ref 150–400)
RBC: 4.45 MIL/uL (ref 3.87–5.11)
RDW: 14.1 % (ref 11.5–15.5)
WBC: 8.4 10*3/uL (ref 4.0–10.5)

## 2015-12-26 LAB — URINE MICROSCOPIC-ADD ON

## 2015-12-26 LAB — URIC ACID: Uric Acid, Serum: 4.6 mg/dL (ref 2.3–6.6)

## 2015-12-26 MED ORDER — HYDROCODONE-ACETAMINOPHEN 5-325 MG PO TABS
1.0000 | ORAL_TABLET | Freq: Once | ORAL | Status: AC
  Administered 2015-12-26: 1 via ORAL
  Filled 2015-12-26: qty 1

## 2015-12-26 MED ORDER — HYDROCODONE-ACETAMINOPHEN 5-325 MG PO TABS: ORAL_TABLET | ORAL | 0 refills | Status: DC

## 2015-12-26 MED ORDER — MELOXICAM 7.5 MG PO TABS: 7.5000 mg | ORAL_TABLET | Freq: Every day | ORAL | 0 refills | Status: DC

## 2015-12-26 NOTE — ED Triage Notes (Signed)
Pt c/o generalized muscle aches, generalized joint stiffness/numbness, and urinary urgency x 2 months and unable to fully extend R elbow x 3 weeks.  Denies injury.  Pain score 6/10.  Pt reports taking 400mg  ibuprofen w/o relief.  Denies n/v/d.

## 2015-12-26 NOTE — Discharge Instructions (Signed)
Take vicodin for breakthrough pain, do not drink alcohol, drive, care for children or do other critical tasks while taking vicodin.   Do not hesitate to return to the emergency room for any new, worsening or concerning symptoms.  Please obtain primary care using resource guide below. Let them know that you were seen in the emergency room and that they will need to obtain records for further outpatient management.

## 2015-12-26 NOTE — ED Provider Notes (Signed)
[facility] DEPT Provider Note   CSN: [id number] Arrival date & time: 12/26/15  1447     History   Chief Complaint Chief Complaint  Patient presents with  . Generalized Body Aches  . Joint Pain   HPI   Blood pressure 143/88, pulse 84, temperature 97.9 F (36.6 C), temperature source Oral, resp. rate 16, height 6' (1.829 m), weight 127 kg, last menstrual period 01/31/2011, SpO2 95 %.  [name] is a 50 y.o. female with past medical history significant for anemia complaining of generalized weakness onset 2 months ago sensation of pins and needles paresthesia in her hands and feet after she moves them, right elbow pain and swelling worsening over the course of 2 months states that she cannot fully extend the arm secondary to pain onset 2 weeks ago, this is atraumatic. 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 Denies fever, chills, patient states she is able to flex the pharmacist extensions painful. She's been taking 400mg  ibuprofen at home with little relief.   Past Medical History:  Diagnosis Date  . Anemia   . Anxiety   . Asthma    hx as child - no prob as adult - no inhaler  . Blood transfusion 01/22/11   transfusion 2 units at [facility]  . Depression 08/2010   psych assessment  . Dyspnea   . Fibroid   . Headache(784.0)    rx for imitrex - last one jan  . Keloid     Patient Active Problem List   Diagnosis Date Noted  . Abnormal ECG 01/29/2013  . Dyspnea   . Menorrhagia with regular cycle 02/22/2011  . Anemia 02/22/2011    Past Surgical History:  Procedure Laterality Date  . ABDOMINAL HYSTERECTOMY    . HERNIA REPAIR  123XX123   umbilical  . SVD     x 1    OB History    Gravida Para Term Preterm AB Living   3 1 1   2 1    SAB TAB Ectopic Multiple Live Births   1 1             Home Medications    Prior to Admission medications   Medication Sig Start Date End Date Taking? Authorizing  Provider  ibuprofen (ADVIL,MOTRIN) 200 MG tablet Take 400 mg by mouth every 6 (six) hours as needed for headache or moderate pain.   Yes Historical Provider, MD  Multiple Vitamins-Minerals (MULTIVITAMIN GUMMIES ADULT PO) Take 1 capsule by mouth daily.   Yes Historical Provider, MD  OVER THE COUNTER MEDICATION Inhale 1 Inhaler into the lungs daily. Menthol Oil Nasal inhaler   Yes Historical Provider, MD  oxymetazoline (AFRIN NASAL SPRAY) 0.05 % nasal spray Place 1 spray into both nostrils 2 (two) times daily. Patient taking differently: Place 1 spray into both nostrils 2 (two) times daily as needed for congestion.  10/06/15  Yes Nona Dell, PA-C  HYDROcodone-acetaminophen (NORCO/VICODIN) 5-325 MG tablet Take 1-2 tablets by mouth every 6 hours as needed for pain and/or cough. 12/26/15   Elizette Shek, PA-C  meloxicam (MOBIC) 7.5 MG tablet Take 1 tablet (7.5 mg total) by mouth daily. 12/26/15   Estephanie Hubbs, PA-C  pseudoephedrine (SUDAFED) 60 MG tablet Take 1 tablet (60 mg total) by mouth every 6 (six) hours as needed for congestion. Patient not taking: Reported on 12/26/2015 10/06/15   Nona Dell, PA-C    Family History Family  History  Problem Relation Age of Onset  . Heart disease Mother   . Hypertension Mother   . Hypertension Father   . Hypertension Sister     Social History Social History  Substance Use Topics  . Smoking status: Never Smoker  . Smokeless tobacco: Never Used  . Alcohol use No     Allergies   Augmentin [amoxicillin-pot clavulanate] and Latex   Review of Systems Review of Systems  10 systems reviewed and found to be negative, except as noted in the HPI.  Physical Exam Updated Vital Signs BP 129/93 (BP Location: Left Arm)   Pulse 95   Temp 97.9 F (36.6 C) (Oral)   Resp 18   Ht 6' (1.829 m)   Wt 127 kg   LMP 01/31/2011   SpO2 99%   BMI 37.97 kg/m   Physical Exam  Constitutional: She is oriented to person, place, and  time. She appears well-developed and well-nourished. No distress.  Obese  HENT:  Head: Normocephalic and atraumatic.  Mouth/Throat: Oropharynx is clear and moist.  Eyes: Conjunctivae and EOM are normal. Pupils are equal, round, and reactive to light.  Neck: Normal range of motion.  Cardiovascular: Normal rate, regular rhythm and intact distal pulses.   Pulmonary/Chest: Effort normal and breath sounds normal.  Abdominal: Soft. There is no tenderness.  Musculoskeletal:  Right elbow easily tender to palpation without warmth overlying induration or discharge distally neurovascularly intact.  Neurological: She is alert and oriented to person, place, and time. She displays normal reflexes.  Skin: She is not diaphoretic.  Psychiatric: She has a normal mood and affect.  Nursing note and vitals reviewed.   ED Treatments / Results  Labs (all labs ordered are listed, but only abnormal results are displayed) Labs Reviewed  URINALYSIS, ROUTINE W REFLEX MICROSCOPIC (NOT AT [facility]) - Abnormal; Notable for the following:       Result Value   Color, Urine AMBER (*)    APPearance CLOUDY (*)    Specific Gravity, Urine 1.040 (*)    Bilirubin Urine SMALL (*)    Protein, ur 30 (*)    Leukocytes, UA TRACE (*)    All other components within normal limits  COMPREHENSIVE METABOLIC PANEL - Abnormal; Notable for the following:    Potassium 3.3 (*)    Total Protein 8.8 (*)    All other components within normal limits  URINE MICROSCOPIC-ADD ON - Abnormal; Notable for the following:    Squamous Epithelial / LPF 6-30 (*)    Bacteria, UA MANY (*)    All other components within normal limits  CBC WITH DIFFERENTIAL/PLATELET  URIC ACID   EKG  EKG Interpretation None       Radiology Dg Elbow Complete Right  Result Date: 12/26/2015 CLINICAL DATA:  Generalized right elbow pain for 3 weeks. No trauma. EXAM: RIGHT ELBOW - COMPLETE 3+ VIEW COMPARISON:  None. FINDINGS: There is a joint effusion with  displacement of the anterior posterior fat pad. No fractures or dislocations. No bony abnormalities. IMPRESSION: Joint effusion.  In the absence of trauma, this is nonspecific. Electronically Signed   By: Dorise Bullion III M.D   On: 12/26/2015 17:16    Procedures Procedures (including critical care time)  Medications Ordered in ED Medications  HYDROcodone-acetaminophen (NORCO/VICODIN) 5-325 MG per tablet 1 tablet (1 tablet Oral Given 12/26/15 1638)     Initial Impression / Assessment and Plan / ED Course  I have reviewed the triage vital signs and the nursing notes.  Pertinent labs & imaging results that were available during my care of the patient were reviewed by me and considered in my medical decision making (see chart for details).  Clinical Course    Vitals:   12/26/15 1501 12/26/15 1654 12/26/15 1921 12/26/15 2036  BP:  130/70 127/74 129/93  Pulse:  88 87 95  Resp:  18 18 18   Temp:      TempSrc:      SpO2:  97% 100% 99%  Weight: 127 kg     Height: 6' (1.829 m)       Medications  HYDROcodone-acetaminophen (NORCO/VICODIN) 5-325 MG per tablet 1 tablet (1 tablet Oral Given 12/26/15 1638)    [name] is 50 y.o. female presenting with Multiple complaints pain to the right arm, she finds it painful to extend, no warmth, doubt this is a septic joint, patient afebrile and overall quite well appearing. The length of the symptoms would argue against infected joint is been going on for multiple weeks. She is right-hand dominant, she works at a desk and types all day, think this is probably inflammation from her repetitive motion from her job.  Uric acid negative, patient given sling, orthopedic referral, case management for primary care. Patient given Modicon and a short prescription of Vicodin. Work note provided.  Evaluation does not show pathology that would require ongoing emergent intervention or inpatient treatment. Pt is hemodynamically stable and mentating  appropriately. Discussed findings and plan with patient/guardian, who agrees with care plan. All questions answered. Return precautions discussed and outpatient follow up given.   Final Clinical Impressions(s) / ED Diagnoses   Final diagnoses:  Effusion of right elbow  Fatigue, unspecified type    New Prescriptions Discharge Medication List as of 12/26/2015  8:26 PM    START taking these medications   Details  HYDROcodone-acetaminophen (NORCO/VICODIN) 5-325 MG tablet Take 1-2 tablets by mouth every 6 hours as needed for pain and/or cough., Print    meloxicam (MOBIC) 7.5 MG tablet Take 1 tablet (7.5 mg total) by mouth daily., Starting Sun 12/26/2015, Print         Elmyra Ricks Quinette Hentges, PA-C 12/26/15 2132    Tanna Furry, MD 01/04/16 1949

## 2015-12-27 ENCOUNTER — Telehealth: Payer: Self-pay | Admitting: *Deleted

## 2016-01-01 ENCOUNTER — Ambulatory Visit: Payer: Self-pay

## 2016-10-05 ENCOUNTER — Emergency Department (HOSPITAL_COMMUNITY)
Admission: EM | Admit: 2016-10-05 | Discharge: 2016-10-06 | Disposition: A | Discharge status: Home / Self Care | Payer: Self-pay | Attending: Emergency Medicine | Admitting: Emergency Medicine

## 2016-10-05 DIAGNOSIS — Z91040 Latex allergy status: Secondary | ICD-10-CM | POA: Insufficient documentation

## 2016-10-05 DIAGNOSIS — L03316 Cellulitis of umbilicus: Secondary | ICD-10-CM | POA: Insufficient documentation

## 2016-10-05 DIAGNOSIS — L910 Hypertrophic scar: Secondary | ICD-10-CM | POA: Insufficient documentation

## 2016-10-05 LAB — COMPREHENSIVE METABOLIC PANEL
ALT: 18 U/L (ref 14–54)
AST: 20 U/L (ref 15–41)
Albumin: 3.9 g/dL (ref 3.5–5.0)
Alkaline Phosphatase: 59 U/L (ref 38–126)
Anion gap: 7 (ref 5–15)
BUN: 13 mg/dL (ref 6–20)
CO2: 28 mmol/L (ref 22–32)
Calcium: 9.5 mg/dL (ref 8.9–10.3)
Chloride: 103 mmol/L (ref 101–111)
Creatinine, Ser: 0.76 mg/dL (ref 0.44–1.00)
GFR calc Af Amer: >60 mL/min (ref >60)
GFR calc non Af Amer: >60 mL/min (ref >60)
Glucose, Bld: 121 mg/dL — ABNORMAL HIGH (ref 65–99)
Potassium: 3.6 mmol/L (ref 3.5–5.1)
Sodium: 138 mmol/L (ref 135–145)
Total Bilirubin: 0.5 mg/dL (ref 0.3–1.2)
Total Protein: 7.9 g/dL (ref 6.5–8.1)

## 2016-10-05 LAB — CBC WITH DIFFERENTIAL/PLATELET
Basophils Absolute: 0 10*3/uL (ref 0.0–0.1)
Basophils Relative: 0 %
Eosinophils Absolute: 0.4 10*3/uL (ref 0.0–0.7)
Eosinophils Relative: 6 %
HCT: 37.6 % (ref 36.0–46.0)
Hemoglobin: 12.3 g/dL (ref 12.0–15.0)
Lymphocytes Relative: 33 %
Lymphs Abs: 2.2 10*3/uL (ref 0.7–4.0)
MCH: 28.9 pg (ref 26.0–34.0)
MCHC: 32.7 g/dL (ref 30.0–36.0)
MCV: 88.3 fL (ref 78.0–100.0)
Monocytes Absolute: 0.6 10*3/uL (ref 0.1–1.0)
Monocytes Relative: 9 %
Neutro Abs: 3.4 10*3/uL (ref 1.7–7.7)
Neutrophils Relative %: 52 %
Platelets: 338 10*3/uL (ref 150–400)
RBC: 4.26 MIL/uL (ref 3.87–5.11)
RDW: 14 % (ref 11.5–15.5)
WBC: 6.7 10*3/uL (ref 4.0–10.5)

## 2016-10-05 LAB — LIPASE, BLOOD: Lipase: 24 U/L (ref 11–51)

## 2016-10-05 MED ORDER — IOPAMIDOL (ISOVUE-300) INJECTION 61%: 30.0000 mL | Freq: Once | INTRAVENOUS | Status: DC | PRN

## 2016-10-05 MED ORDER — IOPAMIDOL (ISOVUE-300) INJECTION 61%
INTRAVENOUS | Status: AC
  Administered 2016-10-06: 100 mL via INTRAVENOUS
  Filled 2016-10-05: qty 30

## 2016-10-05 NOTE — ED Triage Notes (Signed)
Pt states that she has a scar and keloid on her abdomen from a hernia surgery and she has had drainage and pain x 1.5 weeks. Alert and oriented.

## 2016-10-05 NOTE — ED Provider Notes (Signed)
[facility] DEPT Provider Note   CSN: [id number] Arrival date & time: 10/05/16  1441     History   Chief Complaint Chief Complaint  Patient presents with  . Wound Infection    HPI [name] is a 51 y.o. female.  HPI Patient, with a past medical history of hernia surgery 9 years ago, presents to ED for evaluation of possible infection on scar/keloid on her abdomen. She states the area has been draining clear/puslike drainage for the past 1-1/2 weeks. She denies any previous history of similar symptoms in the past. She states that the area around the keloid is painful. She has tried taking Aleve twice a day for some alleviation symptoms. She is not taking any medications today. She denies any fevers, chills, nausea, vomiting, urinary symptoms or changes in bowel habits. Of note, patient states that hernia surgery was done 9 years ago in [place]. She states that a mesh was put in. She is unsure if the keloid itself or the mesh is infected.   Past Medical History:  Diagnosis Date  . Anemia   . Anxiety   . Asthma    hx as child - no prob as adult - no inhaler  . Blood transfusion 01/22/11   transfusion 2 units at [facility]  . Depression 08/2010   psych assessment  . Dyspnea   . Fibroid   . Headache(784.0)    rx for imitrex - last one jan  . Keloid     Patient Active Problem List   Diagnosis Date Noted  . Abnormal ECG 01/29/2013  . Dyspnea   . Menorrhagia with regular cycle 02/22/2011  . Anemia 02/22/2011    Past Surgical History:  Procedure Laterality Date  . ABDOMINAL HYSTERECTOMY    . HERNIA REPAIR  7341   umbilical  . SVD     x 1    OB History    Gravida Para Term Preterm AB Living   3 1 1   2 1    SAB TAB Ectopic Multiple Live Births   1 1             Home Medications    Prior to Admission medications   Medication Sig Start Date End Date Taking? Authorizing Provider  naproxen sodium (ANAPROX) 220 MG tablet Take 440 mg by mouth daily as needed  (pain).   Yes [provider]  HYDROcodone-acetaminophen (NORCO/VICODIN) 5-325 MG tablet Take 1-2 tablets by mouth every 6 hours as needed for pain and/or cough. Patient not taking: Reported on 10/05/2016 12/26/15   Pisciotta, Elmyra Ricks, PA-C  ibuprofen (ADVIL,MOTRIN) 200 MG tablet Take 400 mg by mouth every 6 (six) hours as needed for headache or moderate pain.    [provider]  meloxicam (MOBIC) 7.5 MG tablet Take 1 tablet (7.5 mg total) by mouth daily. Patient not taking: Reported on 10/05/2016 12/26/15   Pisciotta, Elmyra Ricks, PA-C  oxymetazoline (AFRIN NASAL SPRAY) 0.05 % nasal spray Place 1 spray into both nostrils 2 (two) times daily. Patient not taking: Reported on 10/05/2016 10/06/15   Nona Dell, PA-C  pseudoephedrine (SUDAFED) 60 MG tablet Take 1 tablet (60 mg total) by mouth every 6 (six) hours as needed for congestion. Patient not taking: Reported on 12/26/2015 10/06/15   Nona Dell, PA-C    Family History Family History  Problem Relation Age of Onset  . Heart disease Mother   . Hypertension Mother   . Hypertension Father   . Hypertension Sister  Social History Social History  Substance Use Topics  . Smoking status: Never Smoker  . Smokeless tobacco: Never Used  . Alcohol use No     Allergies   Augmentin [amoxicillin-pot clavulanate] and Latex   Review of Systems Review of Systems  Constitutional: Negative for appetite change, chills and fever.  HENT: Negative for ear pain, rhinorrhea, sneezing and sore throat.   Eyes: Negative for photophobia and visual disturbance.  Respiratory: Negative for cough, chest tightness, shortness of breath and wheezing.   Cardiovascular: Negative for chest pain and palpitations.  Gastrointestinal: Positive for abdominal pain. Negative for blood in stool, constipation, diarrhea, nausea and vomiting.  Genitourinary: Negative for dysuria, hematuria and urgency.  Musculoskeletal: Negative for  myalgias.  Skin: Positive for rash.  Neurological: Negative for dizziness, weakness and light-headedness.     Physical Exam Updated Vital Signs BP (!) 163/111 (BP Location: Right Arm)   Pulse 84   Temp 97.8 F (36.6 C) (Oral)   Resp 12   LMP 01/31/2011   SpO2 100%   Physical Exam  Constitutional: She appears well-developed and well-nourished. No distress.  Nontoxic appearing, not in acute distress. Appears comfortable and pleasant on exam bed.  HENT:  Head: Normocephalic and atraumatic.  Nose: Nose normal.  Eyes: Conjunctivae and EOM are normal. Left eye exhibits no discharge. No scleral icterus.  Neck: Normal range of motion. Neck supple.  Cardiovascular: Normal rate, regular rhythm, normal heart sounds and intact distal pulses.  Exam reveals no gallop and no friction rub.   No murmur heard. Pulmonary/Chest: Effort normal and breath sounds normal. No respiratory distress.  Abdominal: Soft. Bowel sounds are normal. She exhibits no distension. There is no tenderness. There is no rebound and no guarding.  Musculoskeletal: Normal range of motion. She exhibits no edema.  Neurological: She is alert. She exhibits normal muscle tone. Coordination normal.  Skin: Skin is warm and dry. Rash noted.  Keloid as indicated in the image below, some clear drainage noted. There are three 3cm keloids on the lower abdomen with no signs of infection noted, as well as a larger keloid on the R neck.  Psychiatric: She has a normal mood and affect.  Nursing note and vitals reviewed.      ED Treatments / Results  Labs (all labs ordered are listed, but only abnormal results are displayed) Labs Reviewed  COMPREHENSIVE METABOLIC PANEL - Abnormal; Notable for the following:       Result Value   Glucose, Bld 121 (*)    All other components within normal limits  LIPASE, BLOOD  CBC WITH DIFFERENTIAL/PLATELET    EKG  EKG Interpretation None       Radiology No results  found.  Procedures Procedures (including critical care time)  Medications Ordered in ED Medications  iopamidol (ISOVUE-300) 61 % injection 30 mL (not administered)  iopamidol (ISOVUE-300) 61 % injection (not administered)     Initial Impression / Assessment and Plan / ED Course  I have reviewed the triage vital signs and the nursing notes.  Pertinent labs & imaging results that were available during my care of the patient were reviewed by me and considered in my medical decision making (see chart for details).     Patient presents to the ED for evaluation of clear/pus drainage and pain at site of keloid which is a result of a hernia repair surgery done 9 years ago. She states that symptoms have been going on for the past week and a half. She denies  any fevers or chills. She denies any previous history of similar symptoms. On physical exam there is some drainage noted from site as indicated in the image above. She is afebrile, not tachycardic or tachypnec. She is nontoxic-appearing and not in acute distress. CBC unremarkable with no leukocytosis. CMP unremarkable. Lipase unremarkable. We will obtain CT of the abdomen and pelvis with contrast to evaluate for deeper infection. I anticipate superficial infection. If so, will give doxy and topical Mupirocin. Will dispo accordingly. CT pending at shift change. Care signed out to oncoming provider.  Final Clinical Impressions(s) / ED Diagnoses   Final diagnoses:  None    New Prescriptions New Prescriptions   No medications on file     Delia Heady, PA-C 10/06/16 0049    Duffy Bruce, MD 10/06/16 1756

## 2016-10-06 ENCOUNTER — Encounter (HOSPITAL_COMMUNITY): Payer: Self-pay

## 2016-10-06 ENCOUNTER — Emergency Department (HOSPITAL_COMMUNITY): Payer: Self-pay

## 2016-10-06 MED ORDER — IOPAMIDOL (ISOVUE-300) INJECTION 61%
INTRAVENOUS | Status: AC
  Filled 2016-10-06: qty 100

## 2016-10-06 MED ORDER — IOPAMIDOL (ISOVUE-300) INJECTION 61%
100.0000 mL | Freq: Once | INTRAVENOUS | Status: AC | PRN
  Administered 2016-10-06: 100 mL via INTRAVENOUS

## 2016-10-06 MED ORDER — MUPIROCIN CALCIUM 2 % EX CREA: 1.0000 "application " | TOPICAL_CREAM | Freq: Two times a day (BID) | CUTANEOUS | 0 refills | Status: DC

## 2016-10-06 MED ORDER — DOXYCYCLINE HYCLATE 100 MG PO CAPS: 100.0000 mg | ORAL_CAPSULE | Freq: Two times a day (BID) | ORAL | 0 refills | Status: DC

## 2016-10-06 NOTE — ED Provider Notes (Signed)
Care assumed from [facility], [place]. Please see her full H&P.  [name] is a 51 y.o. female history of hernia repair with mesh 9 years ago in [place] with subsequent keloid presents with concerns about possible infection. She reports the area has been draining a clear and pus like drainage for the last 1-2 weeks.  Patient denies fevers or chills, nausea or vomiting, abdominal pain, urinary symptoms or changes in bowel habits.  Physical Exam  BP (!) 174/103 (BP Location: Right Arm)   Pulse 83   Temp 97.6 F (36.4 C) (Oral)   Resp 15   LMP 01/31/2011   SpO2 100%   Physical Exam  Constitutional: She appears well-developed and well-nourished. No distress.  HENT:  Head: Normocephalic.  Eyes: Conjunctivae are normal. No scleral icterus.  Neck: Normal range of motion.  Cardiovascular: Normal rate and intact distal pulses.   Pulmonary/Chest: Effort normal.  Musculoskeletal: Normal range of motion.  Neurological: She is alert.  Skin: Skin is warm and dry.  Nursing note and vitals reviewed.   ED Course  Procedures    Results for orders placed or performed during the hospital encounter of 10/05/16  Comprehensive metabolic panel  Result Value Ref Range   Sodium 138 135 - 145 mmol/L   Potassium 3.6 3.5 - 5.1 mmol/L   Chloride 103 101 - 111 mmol/L   CO2 28 22 - 32 mmol/L   Glucose, Bld 121 (H) 65 - 99 mg/dL   BUN 13 6 - 20 mg/dL   Creatinine, Ser 0.76 0.44 - 1.00 mg/dL   Calcium 9.5 8.9 - 10.3 mg/dL   Total Protein 7.9 6.5 - 8.1 g/dL   Albumin 3.9 3.5 - 5.0 g/dL   AST 20 15 - 41 U/L   ALT 18 14 - 54 U/L   Alkaline Phosphatase 59 38 - 126 U/L   Total Bilirubin 0.5 0.3 - 1.2 mg/dL   GFR calc non Af Amer >60 >60 mL/min   GFR calc Af Amer >60 >60 mL/min   Anion gap 7 5 - 15  Lipase, blood  Result Value Ref Range   Lipase 24 11 - 51 U/L  CBC with Differential  Result Value Ref Range   WBC 6.7 4.0 - 10.5 K/uL   RBC 4.26 3.87 - 5.11 MIL/uL   Hemoglobin 12.3 12.0 - 15.0  g/dL   HCT 37.6 36.0 - 46.0 %   MCV 88.3 78.0 - 100.0 fL   MCH 28.9 26.0 - 34.0 pg   MCHC 32.7 30.0 - 36.0 g/dL   RDW 14.0 11.5 - 15.5 %   Platelets 338 150 - 400 K/uL   Neutrophils Relative % 52 %   Neutro Abs 3.4 1.7 - 7.7 K/uL   Lymphocytes Relative 33 %   Lymphs Abs 2.2 0.7 - 4.0 K/uL   Monocytes Relative 9 %   Monocytes Absolute 0.6 0.1 - 1.0 K/uL   Eosinophils Relative 6 %   Eosinophils Absolute 0.4 0.0 - 0.7 K/uL   Basophils Relative 0 %   Basophils Absolute 0.0 0.0 - 0.1 K/uL     Clinical Course as of Oct 06 348  Fri Oct 06, 2016  3546 CT scan with: 1. Mild focal inflammation under the keloid at and superior to the umbilicus. Associated mesh appears intact. No evidence of abscess. May reflect soft tissue infection. Underlying small hernia inferior to the level of the mesh contains only fat. 2. Small, nonspecific hypodensities within the liver.  [HM]  [phone]  Patient notified of the nonspecific hypodensities within her liver. Request she follow up with her primary care physician for this.  [HM]    Clinical Course User Index [HM] Latitia Housewright, Gwenlyn Perking     MDM Patient presents with concern of keloid infection.  Labs are reassuring. CT scan shows focal inflammation under the keloid but no evidence of deep space infection. Mesh is intact and hernia appears well contained. Patient does not have uncontrolled pain.  Patient will be discharged home with doxycycline and Bactroban.  Patient noted to be hypertensive in the emergency department.  No signs of hypertensive urgency.  Blood pressure decreased without intervention. No tachycardia.Discussed with patient the need for close follow-up and management by their primary care physician.     Cellulitis of umbilicus  Keloid     Samay Delcarlo, Gwenlyn Perking 10/06/16 0350    Rakeya Glab, Jarrett Soho, PA-C 10/06/16 0350    Ward, Delice Bison, DO 10/06/16 [phone]

## 2016-10-06 NOTE — Discharge Instructions (Signed)
1. Medications: doxycycline, Bactroban, usual home medications 2. Treatment: rest, drink plenty of fluids, keep wound clean with warm soap and water, keep bandages dry 3. Follow Up: Please followup with your primary doctor in 2-3 days for discussion of your diagnoses and further evaluation after today's visit; if you do not have a primary care doctor use the resource guide provided to find one; Please return to the ER for fevers, chills, abdominal pain, vomiting, diarrhea or other concerns

## 2016-12-30 ENCOUNTER — Encounter (HOSPITAL_COMMUNITY): Payer: Self-pay | Admitting: Emergency Medicine

## 2016-12-30 ENCOUNTER — Emergency Department (HOSPITAL_COMMUNITY)
Admission: EM | Admit: 2016-12-30 | Discharge: 2016-12-31 | Disposition: A | Discharge status: Home / Self Care | Payer: Self-pay | Attending: Emergency Medicine | Admitting: Emergency Medicine

## 2016-12-30 DIAGNOSIS — Z91040 Latex allergy status: Secondary | ICD-10-CM | POA: Insufficient documentation

## 2016-12-30 DIAGNOSIS — N12 Tubulo-interstitial nephritis, not specified as acute or chronic: Secondary | ICD-10-CM | POA: Insufficient documentation

## 2016-12-30 DIAGNOSIS — Z79899 Other long term (current) drug therapy: Secondary | ICD-10-CM | POA: Insufficient documentation

## 2016-12-30 DIAGNOSIS — J45909 Unspecified asthma, uncomplicated: Secondary | ICD-10-CM | POA: Insufficient documentation

## 2016-12-30 DIAGNOSIS — R350 Frequency of micturition: Secondary | ICD-10-CM | POA: Insufficient documentation

## 2016-12-30 MED ORDER — SODIUM CHLORIDE 0.9 % IV BOLUS (SEPSIS)
1000.0000 mL | Freq: Once | INTRAVENOUS | Status: AC
  Administered 2016-12-30: 1000 mL via INTRAVENOUS

## 2016-12-30 NOTE — ED Triage Notes (Signed)
Pt reports having left sided flank pain with radiation to groin. Pt states pain increases when urinating and dark colored urine that started yesterday.

## 2016-12-31 ENCOUNTER — Emergency Department (HOSPITAL_COMMUNITY): Payer: Self-pay

## 2016-12-31 LAB — CBC WITH DIFFERENTIAL/PLATELET
Basophils Absolute: 0 10*3/uL (ref 0.0–0.1)
Basophils Relative: 0 %
Eosinophils Absolute: 0.3 10*3/uL (ref 0.0–0.7)
Eosinophils Relative: 4 %
HCT: 34.2 % — ABNORMAL LOW (ref 36.0–46.0)
Hemoglobin: 11.1 g/dL — ABNORMAL LOW (ref 12.0–15.0)
Lymphocytes Relative: 23 %
Lymphs Abs: 1.7 10*3/uL (ref 0.7–4.0)
MCH: 28.6 pg (ref 26.0–34.0)
MCHC: 32.5 g/dL (ref 30.0–36.0)
MCV: 88.1 fL (ref 78.0–100.0)
Monocytes Absolute: 0.6 10*3/uL (ref 0.1–1.0)
Monocytes Relative: 8 %
Neutro Abs: 4.6 10*3/uL (ref 1.7–7.7)
Neutrophils Relative %: 65 %
Platelets: 340 10*3/uL (ref 150–400)
RBC: 3.88 MIL/uL (ref 3.87–5.11)
RDW: 14.2 % (ref 11.5–15.5)
WBC: 7.1 10*3/uL (ref 4.0–10.5)

## 2016-12-31 LAB — I-STAT BETA HCG BLOOD, ED (MC, WL, AP ONLY): I-stat hCG, quantitative: <5 m[IU]/mL (ref <5)

## 2016-12-31 LAB — COMPREHENSIVE METABOLIC PANEL
ALT: 9 U/L — ABNORMAL LOW (ref 14–54)
AST: 15 U/L (ref 15–41)
Albumin: 3.6 g/dL (ref 3.5–5.0)
Alkaline Phosphatase: 55 U/L (ref 38–126)
Anion gap: 7 (ref 5–15)
BUN: 12 mg/dL (ref 6–20)
CO2: 24 mmol/L (ref 22–32)
Calcium: 9.5 mg/dL (ref 8.9–10.3)
Chloride: 106 mmol/L (ref 101–111)
Creatinine, Ser: 0.87 mg/dL (ref 0.44–1.00)
GFR calc Af Amer: >60 mL/min (ref >60)
GFR calc non Af Amer: >60 mL/min (ref >60)
Glucose, Bld: 118 mg/dL — ABNORMAL HIGH (ref 65–99)
Potassium: 3.3 mmol/L — ABNORMAL LOW (ref 3.5–5.1)
Sodium: 137 mmol/L (ref 135–145)
Total Bilirubin: 0.8 mg/dL (ref 0.3–1.2)
Total Protein: 7.8 g/dL (ref 6.5–8.1)

## 2016-12-31 LAB — URINALYSIS, ROUTINE W REFLEX MICROSCOPIC
Bilirubin Urine: NEGATIVE
Glucose, UA: NEGATIVE mg/dL
Ketones, ur: 5 mg/dL — AB
Leukocytes, UA: NEGATIVE
Nitrite: NEGATIVE
Protein, ur: 30 mg/dL — AB
Specific Gravity, Urine: 1.029 (ref 1.005–1.030)
pH: 5 (ref 5.0–8.0)

## 2016-12-31 MED ORDER — HYDROCODONE-ACETAMINOPHEN 5-325 MG PO TABS: 1.0000 | ORAL_TABLET | Freq: Four times a day (QID) | ORAL | 0 refills | Status: DC | PRN

## 2016-12-31 MED ORDER — MORPHINE SULFATE (PF) 4 MG/ML IV SOLN
4.0000 mg | Freq: Once | INTRAVENOUS | Status: AC
  Administered 2016-12-31: 4 mg via INTRAVENOUS
  Filled 2016-12-31: qty 1

## 2016-12-31 MED ORDER — CIPROFLOXACIN HCL 500 MG PO TABS: 500.0000 mg | ORAL_TABLET | Freq: Two times a day (BID) | ORAL | 0 refills | Status: DC

## 2016-12-31 MED ORDER — CIPROFLOXACIN IN D5W 400 MG/200ML IV SOLN
400.0000 mg | Freq: Once | INTRAVENOUS | Status: AC
  Administered 2016-12-31: 400 mg via INTRAVENOUS
  Filled 2016-12-31: qty 200

## 2016-12-31 NOTE — Discharge Instructions (Signed)
Take cipro twice daily for a week for kidney infection.   Take motrin for pain.   Take vicodin for severe pain.   See your doctor  Return to ER if you have worse abdominal pain, vomiting, fever.

## 2016-12-31 NOTE — ED Provider Notes (Signed)
[facility] DEPT Provider Note   CSN: [id number] Arrival date & time: 12/30/16  2115     History   Chief Complaint Chief Complaint  Patient presents with  . Flank Pain    HPI [name] is a 51 y.o. female hx of anemia, fibroids, depression here with L flank pain, darker urine. She noted darker urine and urinary frequency for the last several days. Had acute onset of L flank pain this evening associated with nausea but no vomiting. Some chills but no actual fevers. Has chronic leg and arm swelling that is unchanged.   The history is provided by the patient.    Past Medical History:  Diagnosis Date  . Anemia   . Anxiety   . Asthma    hx as child - no prob as adult - no inhaler  . Blood transfusion 01/22/11   transfusion 2 units at [facility]  . Depression 08/2010   psych assessment  . Dyspnea   . Fibroid   . Headache(784.0)    rx for imitrex - last one jan  . Keloid     Patient Active Problem List   Diagnosis Date Noted  . Abnormal ECG 01/29/2013  . Dyspnea   . Menorrhagia with regular cycle 02/22/2011  . Anemia 02/22/2011    Past Surgical History:  Procedure Laterality Date  . ABDOMINAL HYSTERECTOMY    . HERNIA REPAIR  7062   umbilical  . SVD     x 1    OB History    Gravida Para Term Preterm AB Living   3 1 1   2 1    SAB TAB Ectopic Multiple Live Births   1 1             Home Medications    Prior to Admission medications   Medication Sig Start Date End Date Taking? Authorizing Provider  naproxen sodium (ANAPROX) 220 MG tablet Take 440 mg by mouth 2 (two) times daily as needed (pain).    Yes [provider]  doxycycline (VIBRAMYCIN) 100 MG capsule Take 1 capsule (100 mg total) by mouth 2 (two) times daily. Patient not taking: Reported on 12/31/2016 10/06/16   Muthersbaugh, Jarrett Soho, PA-C  mupirocin cream (BACTROBAN) 2 % Apply 1 application topically 2 (two) times daily. Patient not taking: Reported on 12/31/2016  10/06/16   Muthersbaugh, Jarrett Soho, PA-C    Family History Family History  Problem Relation Age of Onset  . Heart disease Mother   . Hypertension Mother   . Hypertension Father   . Hypertension Sister     Social History Social History  Substance Use Topics  . Smoking status: Never Smoker  . Smokeless tobacco: Never Used  . Alcohol use No     Allergies   Augmentin [amoxicillin-pot clavulanate] and Latex   Review of Systems Review of Systems  Genitourinary: Positive for flank pain.  All other systems reviewed and are negative.    Physical Exam Updated Vital Signs BP (!) 157/81   Pulse (!) 101   Temp 98.8 F (37.1 C) (Oral)   Resp 18   Ht 6' (1.829 m)   Wt 129.3 kg (285 lb)   LMP 01/31/2011   SpO2 99%   BMI 38.65 kg/m   Physical Exam  Constitutional: She is oriented to person, place, and time.  Chronically ill, uncomfortable   HENT:  Head: Normocephalic.  Mouth/Throat: Oropharynx is clear and moist.  Eyes: Pupils are equal, round, and reactive to light. Conjunctivae and  EOM are normal.  Neck: Normal range of motion. Neck supple.  Cardiovascular: Normal rate, regular rhythm and normal heart sounds.   Pulmonary/Chest: Effort normal and breath sounds normal. No respiratory distress. She has no wheezes. She has no rales.  Abdominal: Soft. Bowel sounds are normal.  + L CVAT, mild suprapubic tenderness   Musculoskeletal: Normal range of motion.  2+ edema bilaterally (hx of lymphedema)  Neurological: She is alert and oriented to person, place, and time.  Skin: Skin is warm.  Psychiatric: She has a normal mood and affect.  Nursing note and vitals reviewed.    ED Treatments / Results  Labs (all labs ordered are listed, but only abnormal results are displayed) Labs Reviewed  URINALYSIS, ROUTINE W REFLEX MICROSCOPIC - Abnormal; Notable for the following:       Result Value   Color, Urine AMBER (*)    APPearance HAZY (*)    Hgb urine dipstick SMALL (*)     Ketones, ur 5 (*)    Protein, ur 30 (*)    Bacteria, UA RARE (*)    Squamous Epithelial / LPF 6-30 (*)    Crystals PRESENT (*)    All other components within normal limits  CBC WITH DIFFERENTIAL/PLATELET - Abnormal; Notable for the following:    Hemoglobin 11.1 (*)    HCT 34.2 (*)    All other components within normal limits  COMPREHENSIVE METABOLIC PANEL - Abnormal; Notable for the following:    Potassium 3.3 (*)    Glucose, Bld 118 (*)    ALT 9 (*)    All other components within normal limits  URINE CULTURE  I-STAT BETA HCG BLOOD, ED ([facility], [facility], [facility] ONLY)    EKG  EKG Interpretation None       Radiology Ct Renal Stone Study  Result Date: 12/31/2016 CLINICAL DATA:  Initial evaluation for acute left-sided flank pain with radiation to groin. EXAM: CT ABDOMEN AND PELVIS WITHOUT CONTRAST TECHNIQUE: Multidetector CT imaging of the abdomen and pelvis was performed following the standard protocol without IV contrast. COMPARISON:  Prior CT from 10/06/2016. FINDINGS: Lower chest: Mild scattered subsegmental atelectatic changes present within the visualized lung bases. Visualized lungs are otherwise clear. Hepatobiliary: 15 mm hypodensity within the posterior right hepatic lobe again noted, likely small cysts, stable from previous. Limited noncontrast evaluation of the liver is otherwise unremarkable. Gallbladder within normal limits. No biliary dilatation. Pancreas: Pancreas within normal limits. Spleen: Spleen within normal limits. Adrenals/Urinary Tract: Adrenal glands are normal. Kidneys equal in size without evidence for nephrolithiasis or hydronephrosis. No radiopaque calculi seen along the course of either renal collecting system. No hydroureter. Partially distended bladder within normal limits. No layering stones within the bladder lumen. Stomach/Bowel: Stomach within normal limits. No evidence for bowel obstruction. No acute inflammatory changes seen about the bowels. Appendix normal.  Vascular/Lymphatic: Intra-abdominal aorta of normal caliber. No enlarged intra-abdominal adenopathy. Enlarged bilateral external iliac lymph nodes are seen, left greater than right. These measure up to 2.3 cm on the left and 1.4 cm on the right. Mildly prominent pelvic sidewall nodes measure up to 15 mm on the left and 13 mm on the right. Mildly enlarged inguinal nodes measure up to 2 cm on the left and 16 mm on the right. Findings are relatively similar to previous. Reproductive: Uterus is absent.  Ovaries grossly normal. Other: Are no free air or fluid. Sequelae of prior ventral hernia repair again noted. Adjacent small fat containing ventral hernia with overlying cuboid scar again  noted, similar to previous. No associated inflammation. Musculoskeletal: No acute osseous abnormality. No worrisome lytic or blastic osseous lesions. IMPRESSION: 1. No CT evidence for nephrolithiasis or obstructive uropathy. 2. No other acute intra-abdominal or pelvic process. 3. Enlarged bilateral pelvic and inguinal adenopathy as above, of uncertain etiology, but may be reactive in nature. This is relatively stable from prior CT from 10/06/2016. 4. Sequelae of prior ventral hernia repair with small recurrent fat containing ventral hernia and overlying keloid scar, stable. Electronically Signed   By: Jeannine Boga M.D.   On: 12/31/2016 01:00    Procedures Procedures (including critical care time)  Medications Ordered in ED Medications  ciprofloxacin (CIPRO) IVPB 400 mg (400 mg Intravenous New Bag/Given 12/31/16 0123)  sodium chloride 0.9 % bolus 1,000 mL (1,000 mLs Intravenous New Bag/Given 12/30/16 2353)  morphine 4 MG/ML injection 4 mg (4 mg Intravenous Given 12/31/16 0009)     Initial Impression / Assessment and Plan / ED Course  I have reviewed the triage vital signs and the nursing notes.  Pertinent labs & imaging results that were available during my care of the patient were reviewed by me and considered in  my medical decision making (see chart for details).    [name] is a 51 y.o. female here with urinary frequency, L flank pain. Likely renal colic vs pyelo. Will get labs, CT renal stone.   1:47 AM  WBC nl. UA showed some bacteria, 6-30 WBC. CT renal stone showed no stone or hydro, had chronic inguinal adenopathy. Has PCN allergy, given cipro. Will dc home with course of cipro for mild pyelo. Afebrile, tachycardia improved with IVF. Will give vicodin for severe pain   Final Clinical Impressions(s) / ED Diagnoses   Final diagnoses:  None    New Prescriptions New Prescriptions   No medications on file     Drenda Freeze, MD 12/31/16 [phone]

## 2017-01-25 ENCOUNTER — Emergency Department (HOSPITAL_COMMUNITY): Payer: Self-pay

## 2017-01-25 ENCOUNTER — Other Ambulatory Visit: Payer: Self-pay

## 2017-01-25 ENCOUNTER — Encounter (HOSPITAL_COMMUNITY): Payer: Self-pay | Admitting: Emergency Medicine

## 2017-01-25 ENCOUNTER — Inpatient Hospital Stay (HOSPITAL_COMMUNITY)
Admission: EM | Admit: 2017-01-25 | Discharge: 2017-02-02 | DRG: 175 | Disposition: A | Discharge status: Home Health | Payer: Self-pay | Attending: Internal Medicine | Admitting: Internal Medicine

## 2017-01-25 DIAGNOSIS — L89152 Pressure ulcer of sacral region, stage 2: Secondary | ICD-10-CM | POA: Diagnosis present

## 2017-01-25 DIAGNOSIS — Z880 Allergy status to penicillin: Secondary | ICD-10-CM

## 2017-01-25 DIAGNOSIS — Z90710 Acquired absence of both cervix and uterus: Secondary | ICD-10-CM

## 2017-01-25 DIAGNOSIS — J45909 Unspecified asthma, uncomplicated: Secondary | ICD-10-CM | POA: Diagnosis present

## 2017-01-25 DIAGNOSIS — D649 Anemia, unspecified: Secondary | ICD-10-CM | POA: Diagnosis present

## 2017-01-25 DIAGNOSIS — R590 Localized enlarged lymph nodes: Secondary | ICD-10-CM | POA: Diagnosis present

## 2017-01-25 DIAGNOSIS — J189 Pneumonia, unspecified organism: Secondary | ICD-10-CM

## 2017-01-25 DIAGNOSIS — R0602 Shortness of breath: Secondary | ICD-10-CM

## 2017-01-25 DIAGNOSIS — A419 Sepsis, unspecified organism: Secondary | ICD-10-CM

## 2017-01-25 DIAGNOSIS — Z91040 Latex allergy status: Secondary | ICD-10-CM

## 2017-01-25 DIAGNOSIS — M25562 Pain in left knee: Secondary | ICD-10-CM | POA: Diagnosis present

## 2017-01-25 DIAGNOSIS — Z79899 Other long term (current) drug therapy: Secondary | ICD-10-CM

## 2017-01-25 DIAGNOSIS — Z6838 Body mass index (BMI) 38.0-38.9, adult: Secondary | ICD-10-CM

## 2017-01-25 DIAGNOSIS — M1990 Unspecified osteoarthritis, unspecified site: Secondary | ICD-10-CM

## 2017-01-25 DIAGNOSIS — G8929 Other chronic pain: Secondary | ICD-10-CM | POA: Diagnosis present

## 2017-01-25 DIAGNOSIS — R32 Unspecified urinary incontinence: Secondary | ICD-10-CM | POA: Diagnosis present

## 2017-01-25 DIAGNOSIS — E876 Hypokalemia: Secondary | ICD-10-CM | POA: Diagnosis present

## 2017-01-25 DIAGNOSIS — M069 Rheumatoid arthritis, unspecified: Secondary | ICD-10-CM

## 2017-01-25 DIAGNOSIS — J181 Lobar pneumonia, unspecified organism: Secondary | ICD-10-CM | POA: Diagnosis present

## 2017-01-25 DIAGNOSIS — K5909 Other constipation: Secondary | ICD-10-CM | POA: Diagnosis present

## 2017-01-25 DIAGNOSIS — R079 Chest pain, unspecified: Secondary | ICD-10-CM

## 2017-01-25 DIAGNOSIS — I2699 Other pulmonary embolism without acute cor pulmonale: Principal | ICD-10-CM | POA: Diagnosis present

## 2017-01-25 DIAGNOSIS — N390 Urinary tract infection, site not specified: Secondary | ICD-10-CM | POA: Diagnosis present

## 2017-01-25 DIAGNOSIS — R627 Adult failure to thrive: Secondary | ICD-10-CM | POA: Diagnosis present

## 2017-01-25 LAB — CBC WITH DIFFERENTIAL/PLATELET
Basophils Absolute: 0 10*3/uL (ref 0.0–0.1)
Basophils Relative: 0 %
Eosinophils Absolute: 0.2 10*3/uL (ref 0.0–0.7)
Eosinophils Relative: 2 %
HCT: 32 % — ABNORMAL LOW (ref 36.0–46.0)
Hemoglobin: 10 g/dL — ABNORMAL LOW (ref 12.0–15.0)
Lymphocytes Relative: 17 %
Lymphs Abs: 1.8 10*3/uL (ref 0.7–4.0)
MCH: 27.9 pg (ref 26.0–34.0)
MCHC: 31.3 g/dL (ref 30.0–36.0)
MCV: 89.1 fL (ref 78.0–100.0)
Monocytes Absolute: 0.9 10*3/uL (ref 0.1–1.0)
Monocytes Relative: 9 %
Neutro Abs: 7.8 10*3/uL — ABNORMAL HIGH (ref 1.7–7.7)
Neutrophils Relative %: 72 %
Platelets: 375 10*3/uL (ref 150–400)
RBC: 3.59 MIL/uL — ABNORMAL LOW (ref 3.87–5.11)
RDW: 14.2 % (ref 11.5–15.5)
WBC: 10.8 10*3/uL — ABNORMAL HIGH (ref 4.0–10.5)

## 2017-01-25 LAB — URINALYSIS, ROUTINE W REFLEX MICROSCOPIC
Bilirubin Urine: NEGATIVE
Glucose, UA: NEGATIVE mg/dL
Ketones, ur: NEGATIVE mg/dL
Leukocytes, UA: NEGATIVE
Nitrite: NEGATIVE
Protein, ur: NEGATIVE mg/dL
Specific Gravity, Urine: 1.033 — ABNORMAL HIGH (ref 1.005–1.030)
pH: 5 (ref 5.0–8.0)

## 2017-01-25 LAB — COMPREHENSIVE METABOLIC PANEL
ALT: 19 U/L (ref 14–54)
AST: 24 U/L (ref 15–41)
Albumin: 3.8 g/dL (ref 3.5–5.0)
Alkaline Phosphatase: 57 U/L (ref 38–126)
Anion gap: 6 (ref 5–15)
BUN: 13 mg/dL (ref 6–20)
CO2: 26 mmol/L (ref 22–32)
Calcium: 9.6 mg/dL (ref 8.9–10.3)
Chloride: 106 mmol/L (ref 101–111)
Creatinine, Ser: 0.89 mg/dL (ref 0.44–1.00)
GFR calc Af Amer: >60 mL/min (ref >60)
GFR calc non Af Amer: >60 mL/min (ref >60)
Glucose, Bld: 110 mg/dL — ABNORMAL HIGH (ref 65–99)
Potassium: 3.4 mmol/L — ABNORMAL LOW (ref 3.5–5.1)
Sodium: 138 mmol/L (ref 135–145)
Total Bilirubin: 1.1 mg/dL (ref 0.3–1.2)
Total Protein: 8.2 g/dL — ABNORMAL HIGH (ref 6.5–8.1)

## 2017-01-25 LAB — I-STAT CG4 LACTIC ACID, ED: Lactic Acid, Venous: 1.57 mmol/L (ref 0.5–1.9)

## 2017-01-25 MED ORDER — HYDROCODONE-ACETAMINOPHEN 5-325 MG PO TABS
1.0000 | ORAL_TABLET | Freq: Four times a day (QID) | ORAL | Status: DC | PRN
  Administered 2017-01-26 – 2017-01-27 (×3): 1 via ORAL
  Filled 2017-01-25 (×3): qty 1

## 2017-01-25 MED ORDER — ASPIRIN 81 MG PO CHEW
324.0000 mg | CHEWABLE_TABLET | Freq: Once | ORAL | Status: AC
  Administered 2017-01-26: 324 mg via ORAL
  Filled 2017-01-25: qty 4

## 2017-01-25 MED ORDER — ONDANSETRON HCL 4 MG/2ML IJ SOLN: 4.0000 mg | Freq: Three times a day (TID) | INTRAMUSCULAR | Status: DC | PRN

## 2017-01-25 MED ORDER — LEVALBUTEROL HCL 1.25 MG/0.5ML IN NEBU: 1.2500 mg | INHALATION_SOLUTION | Freq: Four times a day (QID) | RESPIRATORY_TRACT | Status: DC

## 2017-01-25 MED ORDER — ZOLPIDEM TARTRATE 5 MG PO TABS
5.0000 mg | ORAL_TABLET | Freq: Every evening | ORAL | Status: DC | PRN
  Administered 2017-01-26 (×2): 5 mg via ORAL
  Filled 2017-01-25 (×3): qty 1

## 2017-01-25 MED ORDER — ACETAMINOPHEN 325 MG PO TABS
650.0000 mg | ORAL_TABLET | Freq: Four times a day (QID) | ORAL | Status: DC | PRN
  Administered 2017-01-26 – 2017-02-02 (×5): 650 mg via ORAL
  Filled 2017-01-25 (×5): qty 2

## 2017-01-25 MED ORDER — VANCOMYCIN HCL IN DEXTROSE 1-5 GM/200ML-% IV SOLN
1000.0000 mg | Freq: Once | INTRAVENOUS | Status: AC
  Administered 2017-01-25: 1000 mg via INTRAVENOUS
  Filled 2017-01-25: qty 200

## 2017-01-25 MED ORDER — ENOXAPARIN SODIUM 40 MG/0.4ML ~~LOC~~ SOLN: 40.0000 mg | Freq: Every day | SUBCUTANEOUS | Status: DC

## 2017-01-25 MED ORDER — IOPAMIDOL (ISOVUE-300) INJECTION 61%
INTRAVENOUS | Status: AC
  Administered 2017-01-25: 100 mL via INTRAVENOUS
  Filled 2017-01-25: qty 100

## 2017-01-25 MED ORDER — DM-GUAIFENESIN ER 30-600 MG PO TB12
1.0000 | ORAL_TABLET | Freq: Two times a day (BID) | ORAL | Status: DC
  Administered 2017-01-26 – 2017-02-02 (×16): 1 via ORAL
  Filled 2017-01-25 (×16): qty 1

## 2017-01-25 MED ORDER — LEVALBUTEROL HCL 1.25 MG/0.5ML IN NEBU
1.2500 mg | INHALATION_SOLUTION | Freq: Four times a day (QID) | RESPIRATORY_TRACT | Status: DC
  Administered 2017-01-26 (×4): 1.25 mg via RESPIRATORY_TRACT
  Filled 2017-01-25 (×4): qty 0.5

## 2017-01-25 MED ORDER — POTASSIUM CHLORIDE 20 MEQ/15ML (10%) PO SOLN
20.0000 meq | Freq: Once | ORAL | Status: AC
  Administered 2017-01-26: 20 meq via ORAL
  Filled 2017-01-25: qty 15

## 2017-01-25 MED ORDER — SODIUM CHLORIDE 0.9 % IV BOLUS (SEPSIS)
1000.0000 mL | Freq: Once | INTRAVENOUS | Status: AC
  Administered 2017-01-25: 1000 mL via INTRAVENOUS

## 2017-01-25 MED ORDER — LEVOFLOXACIN IN D5W 750 MG/150ML IV SOLN
750.0000 mg | Freq: Once | INTRAVENOUS | Status: AC
  Administered 2017-01-26: 750 mg via INTRAVENOUS
  Filled 2017-01-25: qty 150

## 2017-01-25 MED ORDER — DIPHENHYDRAMINE HCL 50 MG/ML IJ SOLN
25.0000 mg | Freq: Once | INTRAMUSCULAR | Status: AC
  Administered 2017-01-25: 25 mg via INTRAVENOUS
  Filled 2017-01-25: qty 1

## 2017-01-25 MED ORDER — SODIUM CHLORIDE 0.9 % IV SOLN
INTRAVENOUS | Status: DC
  Administered 2017-01-25 – 2017-01-26 (×4): via INTRAVENOUS

## 2017-01-25 MED ORDER — LEVOFLOXACIN IN D5W 750 MG/150ML IV SOLN
750.0000 mg | INTRAVENOUS | Status: DC
  Filled 2017-01-25: qty 150

## 2017-01-25 MED ORDER — HYDRALAZINE HCL 20 MG/ML IJ SOLN: 5.0000 mg | INTRAMUSCULAR | Status: DC | PRN

## 2017-01-25 NOTE — ED Provider Notes (Signed)
[facility] DEPT Provider Note   CSN: [id number] Arrival date & time: 01/25/17  1335     History   Chief Complaint Chief Complaint  Patient presents with  . Urinary Incontinence  . Rash    HPI [name] is a 51 y.o. female.  51 year old female presents with several weeks of rash to her perineum.  Was diagnosed with UTI several weeks ago and states that since that time she is not been able to keep the area dry.  Complains of increased pain without fever or chills at home.  She is nonambulatory due to obesity as well as chronic knee pain.  Denies any flank pain.  No vomiting appreciated.  Symptoms worse with any movement and better with remaining still.  No other treatments used prior to arrival      Past Medical History:  Diagnosis Date  . Anemia   . Anxiety   . Asthma    hx as child - no prob as adult - no inhaler  . Blood transfusion 01/22/11   transfusion 2 units at [facility]  . Depression 08/2010   psych assessment  . Dyspnea   . Fibroid   . Headache(784.0)    rx for imitrex - last one jan  . Keloid     Patient Active Problem List   Diagnosis Date Noted  . Abnormal ECG 01/29/2013  . Dyspnea   . Menorrhagia with regular cycle 02/22/2011  . Anemia 02/22/2011    Past Surgical History:  Procedure Laterality Date  . ABDOMINAL HYSTERECTOMY    . HERNIA REPAIR  2595   umbilical  . SVD     x 1    OB History    Gravida Para Term Preterm AB Living   3 1 1   2 1    SAB TAB Ectopic Multiple Live Births   1 1             Home Medications    Prior to Admission medications   Medication Sig Start Date End Date Taking? Authorizing Provider  ciprofloxacin (CIPRO) 500 MG tablet Take 1 tablet (500 mg total) by mouth 2 (two) times daily. One po bid x 7 days 12/31/16   Drenda Freeze, MD  doxycycline (VIBRAMYCIN) 100 MG capsule Take 1 capsule (100 mg total) by mouth 2 (two) times daily. Patient not taking: Reported on 12/31/2016  10/06/16   Muthersbaugh, Jarrett Soho, PA-C  HYDROcodone-acetaminophen (NORCO/VICODIN) 5-325 MG tablet Take 1 tablet by mouth every 6 (six) hours as needed. 12/31/16   Drenda Freeze, MD  mupirocin cream (BACTROBAN) 2 % Apply 1 application topically 2 (two) times daily. Patient not taking: Reported on 12/31/2016 10/06/16   Muthersbaugh, Jarrett Soho, PA-C  naproxen sodium (ANAPROX) 220 MG tablet Take 440 mg by mouth 2 (two) times daily as needed (pain).     [provider]    Family History Family History  Problem Relation Age of Onset  . Heart disease Mother   . Hypertension Mother   . Hypertension Father   . Hypertension Sister     Social History Social History   Tobacco Use  . Smoking status: Never Smoker  . Smokeless tobacco: Never Used  Substance Use Topics  . Alcohol use: No  . Drug use: No     Allergies   Augmentin [amoxicillin-pot clavulanate] and Latex   Review of Systems Review of Systems  All other systems reviewed and are negative.    Physical Exam Updated Vital Signs BP  123/69 (BP Location: Left Wrist)   Pulse (!) 109   Temp 98.7 F (37.1 C) (Oral)   Resp 20   LMP 01/31/2011   SpO2 100%   Physical Exam  Constitutional: She is oriented to person, place, and time. She appears well-developed and well-nourished.  Non-toxic appearance. No distress.  HENT:  Head: Normocephalic and atraumatic.  Eyes: Conjunctivae, EOM and lids are normal. Pupils are equal, round, and reactive to light.  Neck: Normal range of motion. Neck supple. No tracheal deviation present. No thyroid mass present.  Cardiovascular: Normal rate, regular rhythm and normal heart sounds. Exam reveals no gallop.  No murmur heard. Pulmonary/Chest: Effort normal and breath sounds normal. No stridor. No respiratory distress. She has no decreased breath sounds. She has no wheezes. She has no rhonchi. She has no rales.  Abdominal: Soft. Normal appearance and bowel sounds are normal. She exhibits no  distension. There is no tenderness. There is no rebound and no CVA tenderness.  Musculoskeletal: Normal range of motion. She exhibits no edema or tenderness.       Back:  Neurological: She is alert and oriented to person, place, and time. She has normal strength. No cranial nerve deficit or sensory deficit. GCS eye subscore is 4. GCS verbal subscore is 5. GCS motor subscore is 6.  Skin: Skin is warm and dry. No abrasion and no rash noted.  Psychiatric: She has a normal mood and affect. Her speech is normal and behavior is normal.  Nursing note and vitals reviewed.    ED Treatments / Results  Labs (all labs ordered are listed, but only abnormal results are displayed) Labs Reviewed  URINE CULTURE  URINALYSIS, ROUTINE W REFLEX MICROSCOPIC    EKG  EKG Interpretation None       Radiology No results found.  Procedures Procedures (including critical care time)  Medications Ordered in ED Medications  sodium chloride 0.9 % bolus 1,000 mL (not administered)  0.9 %  sodium chloride infusion (not administered)     Initial Impression / Assessment and Plan / ED Course  I have reviewed the triage vital signs and the nursing notes.  Pertinent labs & imaging results that were available during my care of the patient were reviewed by me and considered in my medical decision making (see chart for details).     With evidence of beginning cellulitis in her perineum.  She also has noted that she has been short of breath.  Checks x-ray consistent with possible early pneumonia.  She does have a low-grade temperature here.  Radiologist was concerned about possible pulmonary embolism.  Will discuss this with the admitting team.  She will require inpatient admission  Final Clinical Impressions(s) / ED Diagnoses   Final diagnoses:  None    ED Discharge Orders    None       Lacretia Leigh, MD 01/25/17 2212

## 2017-01-25 NOTE — ED Triage Notes (Addendum)
Patient reports she was seen x2 weeks ago and dx with kidney infection. Reports since that time she has been incontinent of urine and has developed a painful rash to buttocks. Describes rash as "little boils." Reports prior to kidney infection she was intermittently incontinent.

## 2017-01-25 NOTE — ED Notes (Signed)
Pt is c/o rt sided chest discomfort under breast  8/10 pt reports that it started and hour ago and has some nausea, and lightheaded.

## 2017-01-25 NOTE — Progress Notes (Signed)
Pharmacy Antibiotic Note  [name] is a 51 y.o. female admitted on 01/25/2017 with pneumonia.  Pharmacy has been consulted for Levaquin dosing.  Plan: Levaquin 750 mg IV q24h F/u scr/cultures  Height: 6' (182.9 cm) Weight: 285 lb (129.3 kg) IBW/kg (Calculated) : 73.1  Temp (24hrs), Avg:98.8 F (37.1 C), Min:98.2 F (36.8 C), Max:99.5 F (37.5 C)  Recent Labs  Lab 01/25/17 1814 01/25/17 1825  WBC 10.8*  --   CREATININE 0.89  --   LATICACIDVEN  --  1.57    Estimated Creatinine Clearance: 116.7 mL/min (by C-G formula based on SCr of 0.89 mg/dL).    Allergies  Allergen Reactions  . Augmentin [Amoxicillin-Pot Clavulanate] Hives and Itching    Did PCN reaction causing immediate rash, facial/tongue/throat swelling, SOB or lightheadedness with hypotension: no Did PCN reaction causing severe rash involving mucus membranes or skin necrosis: no Has patient had a PCN reaction that required hospitalization: in hospital Has patient had a PCN reaction occurring within the last 10 years: no If all of the above answers are "NO", then may proceed with Cephalosporin use.  Pt reports no recollection of any reactions when taking penicillin in past  . Latex Hives    Local reaction (welts). Patient denies any wheezing or other reaction with latex exposure    Antimicrobials this admission: 11/28 Levaquin >>    >>   Dose adjustments this admission:   Microbiology results:  BCx:   UCx:    Sputum:    MRSA PCR:   Thank you for allowing pharmacy to be a part of this patient's care.  Dorrene German 01/25/2017 11:52 PM

## 2017-01-25 NOTE — H&P (Signed)
History and Physical    [name] PZW:[id number] DOB: [birth year] DOA: 01/25/2017  Referring MD/NP/PA:   PCP: Patient, No Pcp Per   Patient coming from:  The patient is coming from home.  At baseline, pt is independent for most of ADL.   Chief Complaint: Pleuritic chest pain, shortness of breath, fever, urinary incontinence  HPI: [name] is a 51 y.o. female with medical history significant of morbid obesity, asthma, fibroid, anemia, depression, anxiety, who presents with pleuritic chest pain, shortness breath, fever and urinary incontinence.  Patient states that she has been having pleuritic chest pain and shortness of breath for several days, which has been progressively getting worse. She does not have cough. She has bilateral calf pain. She has mild fever and chills. Patient states that she was treated for UTI 3 weeks ago, after that she developed urinary incontinence, she still has little burning, but no dysuria. Patient has nausea, no vomiting, diarrhea or abdominal pain. No unilateral weakness. Of note, patient has morbid obesity, limiting her mobility. Patient states that she has rashes in her sacral area, but they are actually sacral decubitus ulcer on my examination.  ED Course: pt was found to have positive d-dimer 8.43, WBC 10.8, potassium 3.4, creatinine normal, chest x-ray showed patchy right lower base opacity. CT of abdomen/pelvis is negative for intra-abdominal issue, but showed right lower base opacity in lung. CTA was ordered. I was called by the radiologist and told me that patient has subsegmental PE and left axillary lymphadenopathy, pending formal report of CTA. Patient is admitted to telemetry bed as inpatient.  Review of Systems:   General: has fevers, chills, no body weight gain, has poor appetite, has fatigue HEENT: no blurry vision, hearing changes or sore throat Respiratory: has dyspnea, no coughing, wheezing CV: has chest pain, no  palpitations GI: has nausea, no vomiting, abdominal pain, diarrhea, constipation GU: no dysuria, burning on urination, increased urinary frequency, hematuria  Ext: no leg edema Neuro: no unilateral weakness, numbness, or tingling, no vision change or hearing loss Skin: has sacral decubitus ulcer. MSK: No muscle spasm, no deformity, no limitation of range of movement in spin Heme: No easy bruising.  Travel history: No recent long distant travel.  Allergy:  Allergies  Allergen Reactions  . Augmentin [Amoxicillin-Pot Clavulanate] Hives and Itching    Did PCN reaction causing immediate rash, facial/tongue/throat swelling, SOB or lightheadedness with hypotension: no Did PCN reaction causing severe rash involving mucus membranes or skin necrosis: no Has patient had a PCN reaction that required hospitalization: in hospital Has patient had a PCN reaction occurring within the last 10 years: no If all of the above answers are "NO", then may proceed with Cephalosporin use.  Pt reports no recollection of any reactions when taking penicillin in past  . Latex Hives    Local reaction (welts). Patient denies any wheezing or other reaction with latex exposure    Past Medical History:  Diagnosis Date  . Anemia   . Anxiety   . Asthma    hx as child - no prob as adult - no inhaler  . Blood transfusion 01/22/11   transfusion 2 units at [facility]  . Depression 08/2010   psych assessment  . Dyspnea   . Fibroid   . Headache(784.0)    rx for imitrex - last one jan  . Keloid     Past Surgical History:  Procedure Laterality Date  . ABDOMINAL HYSTERECTOMY    . HERNIA REPAIR  2009  umbilical  . SVD     x 1    Social History:  reports that  has never smoked. she has never used smokeless tobacco. She reports that she does not drink alcohol or use drugs.  Family History:  Family History  Problem Relation Age of Onset  . Heart disease Mother   . Hypertension Mother   . Hypertension Father   .  Hypertension Sister      Prior to Admission medications   Medication Sig Start Date End Date Taking? Authorizing Provider  acetaminophen (TYLENOL) 500 MG tablet Take 1,500 mg by mouth every 6 (six) hours as needed for moderate pain.   Yes [provider]  ciprofloxacin (CIPRO) 500 MG tablet Take 1 tablet (500 mg total) by mouth 2 (two) times daily. One po bid x 7 days Patient not taking: Reported on 01/25/2017 12/31/16   Drenda Freeze, MD  doxycycline (VIBRAMYCIN) 100 MG capsule Take 1 capsule (100 mg total) by mouth 2 (two) times daily. Patient not taking: Reported on 12/31/2016 10/06/16   Muthersbaugh, Jarrett Soho, PA-C  HYDROcodone-acetaminophen (NORCO/VICODIN) 5-325 MG tablet Take 1 tablet by mouth every 6 (six) hours as needed. Patient not taking: Reported on 01/25/2017 12/31/16   Drenda Freeze, MD  mupirocin cream (BACTROBAN) 2 % Apply 1 application topically 2 (two) times daily. Patient not taking: Reported on 12/31/2016 10/06/16   Muthersbaugh, Jarrett Soho, PA-C  naproxen sodium (ANAPROX) 220 MG tablet Take 440 mg by mouth 2 (two) times daily as needed (pain).     [provider]    Physical Exam: Vitals:   01/26/17 0030 01/26/17 0034 01/26/17 0238 01/26/17 0518  BP: (!) 171/85 (!) 171/85  (!) 125/57  Pulse:  (!) 112  (!) 118  Resp: (!) 26 (!) 22  (!) 21  Temp:  99.8 F (37.7 C)  99.6 F (37.6 C)  TempSrc:  Oral  Oral  SpO2:  100% 96% 94%  Weight:      Height:       General: Not in acute distress HEENT:       Eyes: PERRL, EOMI, no scleral icterus.       ENT: No discharge from the ears and nose, no pharynx injection, no tonsillar enlargement.        Neck: No JVD, no bruit, no mass felt. Heme: No neck lymph node enlargement. Cardiac: S1/S2, RRR, No murmurs, No gallops or rubs. Respiratory:  No rales, wheezing, rhonchi or rubs. GI: Soft, nondistended, nontender, no rebound pain, no organomegaly, BS present. GU: No hematuria Ext: No pitting leg edema  bilaterally. 2+DP/PT pulse bilaterally. Musculoskeletal: No joint deformities, No joint redness or warmth, no limitation of ROM in spin. Skin: Sacral decubitus ulcers stage II Neuro: Alert, oriented X3, cranial nerves II-XII grossly intact, moves all extremities normally. Psych: Patient is not psychotic, no suicidal or hemocidal ideation.  Labs on Admission: I have personally reviewed following labs and imaging studies  CBC: Recent Labs  Lab 01/25/17 1814  WBC 10.8*  NEUTROABS 7.8*  HGB 10.0*  HCT 32.0*  MCV 89.1  PLT 017   Basic Metabolic Panel: Recent Labs  Lab 01/25/17 1814 01/26/17 0617  NA 138  --   K 3.4*  --   CL 106  --   CO2 26  --   GLUCOSE 110*  --   BUN 13  --   CREATININE 0.89  --   CALCIUM 9.6  --   MG  --  1.7   GFR:  Estimated Creatinine Clearance: 116.7 mL/min (by C-G formula based on SCr of 0.89 mg/dL). Liver Function Tests: Recent Labs  Lab 01/25/17 1814  AST 24  ALT 19  ALKPHOS 57  BILITOT 1.1  PROT 8.2*  ALBUMIN 3.8   No results for input(s): LIPASE, AMYLASE in the last 168 hours. No results for input(s): AMMONIA in the last 168 hours. Coagulation Profile: No results for input(s): INR, PROTIME in the last 168 hours. Cardiac Enzymes: Recent Labs  Lab 01/26/17 0000 01/26/17 0617  TROPONINI <0.03 0.03*   BNP (last 3 results) No results for input(s): PROBNP in the last 8760 hours. HbA1C: No results for input(s): HGBA1C in the last 72 hours. CBG: No results for input(s): GLUCAP in the last 168 hours. Lipid Profile: No results for input(s): CHOL, HDL, LDLCALC, TRIG, CHOLHDL, LDLDIRECT in the last 72 hours. Thyroid Function Tests: No results for input(s): TSH, T4TOTAL, FREET4, T3FREE, THYROIDAB in the last 72 hours. Anemia Panel: No results for input(s): VITAMINB12, FOLATE, FERRITIN, TIBC, IRON, RETICCTPCT in the last 72 hours. Urine analysis:    Component Value Date/Time   COLORURINE AMBER (A) 01/25/2017 1823   APPEARANCEUR CLEAR  01/25/2017 1823   LABSPEC 1.033 (H) 01/25/2017 1823   PHURINE 5.0 01/25/2017 1823   GLUCOSEU NEGATIVE 01/25/2017 1823   HGBUR SMALL (A) 01/25/2017 1823   BILIRUBINUR NEGATIVE 01/25/2017 1823   KETONESUR NEGATIVE 01/25/2017 1823   PROTEINUR NEGATIVE 01/25/2017 1823   UROBILINOGEN 1.0 08/30/2010 1456   NITRITE NEGATIVE 01/25/2017 1823   LEUKOCYTESUR NEGATIVE 01/25/2017 1823   Sepsis Labs: @LABRCNTIP (procalcitonin:4,lacticidven:4) )No results found for this or any previous visit (from the past 240 hour(s)).   Radiological Exams on Admission: Dg Chest 2 View  Result Date: 01/25/2017 CLINICAL DATA:  Right-sided chest pain.  Shortness of breath. EXAM: CHEST  2 VIEW COMPARISON:  Radiograph 01/24/2013. Lung bases from abdominal CT earlier this day FINDINGS: Patchy opacity in the periphery of the right lung base, localizing to the lower lobe is seen on CT. Low lung volumes with crowding of bronchovascular structures. Apparent increased size of the cardiac silhouette likely secondary to technique, heart size upper normal and CT today. No pleural effusion or pneumothorax. Scoliotic curvature of spine. IMPRESSION: Patchy peripheral right lung base opacity. This could represent pneumonia in the appropriate clinical setting. If there is clinical concern for pulmonary embolus, recommend chest CTA. Electronically Signed   By: Jeb Levering M.D.   On: 01/25/2017 22:00   Ct Angio Chest Pe W Or Wo Contrast  Result Date: 01/26/2017 CLINICAL DATA:  Right chest pain. EXAM: CT ANGIOGRAPHY CHEST WITH CONTRAST TECHNIQUE: Multidetector CT imaging of the chest was performed using the standard protocol during bolus administration of intravenous contrast. Multiplanar CT image reconstructions and MIPs were obtained to evaluate the vascular anatomy. CONTRAST:  100 cc Isovue 370 IV COMPARISON:  Radiographs and abdominal CT yesterday. FINDINGS: Cardiovascular: Technically limited exam due to breathing motion artifact,  contrast bolus timing and soft tissue attenuation from habitus, however there are filling defects within the subsegmental right lower lobe pulmonary arteries. Possible filling defect within a subsegmental lingular pulmonary artery. More detailed assessment is limited. RV to LV ratio ratio of 1. No pericardial effusion, minimal pericardial fluid is physiologic. Mediastinum/Nodes: No enlarged mediastinal or hilar nodes. Left axillary adenopathy with rounded morphology, dominant node measuring 2.4 cm short axis. No right axillary adenopathy. Small hiatal hernia. Visualized thyroid gland is normal. Lungs/Pleura: Peripheral consolidation in the anterior right lower lobe. Minimal peripheral patchy consolidation in  the anterior lingula. Small right pleural effusion. No pulmonary edema. Breathing motion artifact limits more detailed assessment. Upper Abdomen: Assessed on abdominal CT earlier this day. No acute abnormality. Musculoskeletal: Scoliosis and degenerative change in the spine. Possibly exophytic soft tissue density in the subcutaneous tissues about the right jaw. Review of the MIP images confirms the above findings. IMPRESSION: 1. Positive for pulmonary embolus in the subsegmental right lower lobe and possibly lingula. Probable pulmonary infarcts in the right middle lobe and lingula. Small right pleural effusion has developed from abdominal CT yesterday. 2. Pathologic left axillary adenopathy, largest node measuring 2.4 cm short axis. Recommend mammographic evaluation to evaluate for breast malignancy. 3. Soft tissue density, possible exophytic skin lesion superficial to the right jaw. This is amenable to direct inspection/physical exam. Critical Value/emergent results were called by telephone at the time of interpretation on 01/26/2017 at 4:09 am to Dr. Ivor Costa , who verbally acknowledged these results. Electronically Signed   By: Jeb Levering M.D.   On: 01/26/2017 04:10   Ct Abdomen Pelvis W  Contrast  Result Date: 01/25/2017 CLINICAL DATA:  Right-sided chest and upper abdominal pain. Nausea. Recent kidney infection. Ovarian/fallopian/ peritoneal cancer suspected. Prior hysterectomy. EXAM: CT ABDOMEN AND PELVIS WITH CONTRAST TECHNIQUE: Multidetector CT imaging of the abdomen and pelvis was performed using the standard protocol following bolus administration of intravenous contrast. CONTRAST:  167mL ISOVUE-300 IOPAMIDOL (ISOVUE-300) INJECTION 61% COMPARISON:  12/31/2016 stone study. FINDINGS: Lower chest: Anterior right lower lobe airspace disease at the site of minimal scarring on the prior. There is also dependent and lingular presumed scarring. Mild cardiomegaly, without pericardial effusion. Tiny hiatal hernia. Hepatobiliary: Well-circumscribed right hepatic lobe low-density lesion is likely a cyst. Normal gallbladder, without biliary ductal dilatation. Pancreas: Normal, without mass or ductal dilatation. Spleen: Normal in size, without focal abnormality. Adrenals/Urinary Tract: Normal adrenal glands. Bilateral too small to characterize renal lesions. Normal urinary bladder. Stomach/Bowel: Normal stomach, without wall thickening. Colonic stool burden suggests constipation. Normal terminal ileum. Appendix not visualized. Normal small bowel. Vascular/Lymphatic: Normal caliber of the aorta and branch vessels. No retroperitoneal or retrocrural adenopathy. Increased number and size of pelvic sidewall lymph nodes. Index 1.2 cm right external iliac node is similar to on the prior exam. There are enlarged inguinal nodes which are similar to on the recent priors. Reproductive: Hysterectomy.  No adnexal mass. Other: No significant free fluid. Prior ventral abdominal wall hernia repair. There is recurrent or residual small fat containing hernia inferior to the mesh. There is probable overlying scar/keloid. Musculoskeletal: Convex left lumbar spine curvature. IMPRESSION: 1.  No acute process in the abdomen or  pelvis. 2.  Possible constipation. 3. Pelvic adenopathy, similar on recent prior exams. This could be reactive. Correlate with any primary malignancy history. Consider follow-up pelvic CT at 3-6 months. If any history of primary malignancy, consider PET. 4. Increased anterior right lung base opacity could represent an area of infection or given the clinical history of lower right chest pain, pulmonary infarct secondary to pulmonary embolism. If this is a concern, consider dedicated CTA of the chest. Electronically Signed   By: Abigail Miyamoto M.D.   On: 01/25/2017 20:23     EKG:  Not done in ED, will get one.   Assessment/Plan Principal Problem:   PE (pulmonary thromboembolism) (HCC) Active Problems:   Anemia   Urinary incontinence   Hypokalemia   Lobar pneumonia (HCC)   Pressure ulcer of sacral region, stage 2   Axillary lymphadenopathy   PE (pulmonary thromboembolism) (  [place]): CTA showed PE in subsegmental right lower lobe and possibly lingula. Currently hemodynamically stable. Since patient had a mild leukocytosis and a fever, cannot completely rule out lobar pneumonia.  -admit to tele bed as inpt -heparin drip initiated -2D echocardiogram ordered -LE dopplers ordered to evaluate for DVT -check BNP -pain control: When necessary Norco and morphine -continue levaquin a -prn xopenex  Hypokalemia: K= 3.4 on admission. - Repleted - Check Mg level  Anemia: hgb stable. 10.0 -f/u by CBC   Pressure ulcer of sacral region, stage 2: -wound care consult  Axillary lymphadenopathy: as shown by CTA. Need to r/o breast cancer. -will need to get mammogram as outpatient -May give referral to oncologist  Urinary incontinence: Etiology is not clear. Seems to be related to recent urinary tract infection. -Observe for now.   DVT ppx: on IV Heparin     Code Status: Full code Family Communication: None at bed side.    Disposition Plan:  Anticipate discharge back to previous home  environment Consults called:  none Admission status: Inpatient/tele     Date of Service 01/26/2017    Ivor Costa [facility] Pager [phone]  If 7PM-7AM, please contact night-coverage [email or web address] Password [email or web address] 01/26/2017, 7:21 AM

## 2017-01-25 NOTE — Progress Notes (Signed)
A consult was received from an ED physician for [place] per pharmacy dosing.  The patient's profile has been reviewed for ht/wt/allergies/indication/available labs.   A one time order has been placed for Levaquin 750 mg.  Further antibiotics/pharmacy consults should be ordered by admitting physician if indicated.                       Thank you, Dorrene German 01/25/2017  10:30 PM

## 2017-01-25 NOTE — ED Notes (Signed)
ED TO INPATIENT HANDOFF REPORT  Name/Age/Gender [name] 51 y.o. female  Code Status    Code Status Orders  (From admission, onward)        Start     Ordered   01/25/17 2256  Full code  Continuous     01/25/17 2256    Code Status History    Date Active Date Inactive Code Status Order ID Comments User Context   02/22/2011 13:56 02/22/2011 19:37 Full Code [id number]  Andree Elk, RN Inpatient      Home/SNF/Other Home  Chief Complaint Lower back infection  Level of Care/Admitting Diagnosis ED Disposition    ED Disposition Condition [facility] Area: [facility] [[id number]]  Level of Care: Telemetry [5]  Admit to tele based on following criteria: Other see comments  Comments: Tachycardia  Diagnosis: Lobar pneumonia [facility]) [[id number]]  Admitting Physician: Ivor Costa [place]  Attending Physician: Ivor Costa [phone]  Estimated length of stay: past midnight tomorrow  Certification:: I certify this patient will need inpatient services for at least 2 midnights  PT Class (Do Not Modify): Inpatient [101]  PT Acc Code (Do Not Modify): Private [1]       Medical History Past Medical History:  Diagnosis Date  . Anemia   . Anxiety   . Asthma    hx as child - no prob as adult - no inhaler  . Blood transfusion 01/22/11   transfusion 2 units at [facility]  . Depression 08/2010   psych assessment  . Dyspnea   . Fibroid   . Headache(784.0)    rx for imitrex - last one jan  . Keloid     Allergies Allergies  Allergen Reactions  . Augmentin [Amoxicillin-Pot Clavulanate] Hives and Itching    Did PCN reaction causing immediate rash, facial/tongue/throat swelling, SOB or lightheadedness with hypotension: no Did PCN reaction causing severe rash involving mucus membranes or skin necrosis: no Has patient had a PCN reaction that required hospitalization: in hospital Has patient had a PCN reaction occurring within the last 10 years: no If all  of the above answers are "NO", then may proceed with Cephalosporin use.  Pt reports no recollection of any reactions when taking penicillin in past  . Latex Hives    Local reaction (welts). Patient denies any wheezing or other reaction with latex exposure    IV Location/Drains/Wounds Patient Lines/Drains/Airways Status   Active Line/Drains/Airways    Name:   Placement date:   Placement time:   Site:   Days:   Peripheral IV 12/30/16 Right Hand   12/30/16    2352    Hand   26   Peripheral IV 01/25/17 Right Antecubital   01/25/17    1902    Antecubital   less than 1   Incision 02/22/11 Abdomen   02/22/11    1100     2164   Incision 02/22/11 Perineum   02/22/11    1100     2164   Incision - 4 Ports  Umbilicus  Right;Distal  Right;Proximal  Left;Lower   02/22/11    0800     2164          Labs/Imaging Results for orders placed or performed during the hospital encounter of 01/25/17 (from the past 48 hour(s))  CBC with Differential/Platelet     Status: Abnormal   Collection Time: 01/25/17  6:14 PM  Result Value Ref Range   WBC 10.8 (H) 4.0 - 10.5 K/uL  RBC 3.59 (L) 3.87 - 5.11 MIL/uL   Hemoglobin 10.0 (L) 12.0 - 15.0 g/dL   HCT 32.0 (L) 36.0 - 46.0 %   MCV 89.1 78.0 - 100.0 fL   MCH 27.9 26.0 - 34.0 pg   MCHC 31.3 30.0 - 36.0 g/dL   RDW 14.2 11.5 - 15.5 %   Platelets 375 150 - 400 K/uL   Neutrophils Relative % 72 %   Neutro Abs 7.8 (H) 1.7 - 7.7 K/uL   Lymphocytes Relative 17 %   Lymphs Abs 1.8 0.7 - 4.0 K/uL   Monocytes Relative 9 %   Monocytes Absolute 0.9 0.1 - 1.0 K/uL   Eosinophils Relative 2 %   Eosinophils Absolute 0.2 0.0 - 0.7 K/uL   Basophils Relative 0 %   Basophils Absolute 0.0 0.0 - 0.1 K/uL  Comprehensive metabolic panel     Status: Abnormal   Collection Time: 01/25/17  6:14 PM  Result Value Ref Range   Sodium 138 135 - 145 mmol/L   Potassium 3.4 (L) 3.5 - 5.1 mmol/L   Chloride 106 101 - 111 mmol/L   CO2 26 22 - 32 mmol/L   Glucose, Bld 110 (H) 65 - 99 mg/dL    BUN 13 6 - 20 mg/dL   Creatinine, Ser 0.89 0.44 - 1.00 mg/dL   Calcium 9.6 8.9 - 10.3 mg/dL   Total Protein 8.2 (H) 6.5 - 8.1 g/dL   Albumin 3.8 3.5 - 5.0 g/dL   AST 24 15 - 41 U/L   ALT 19 14 - 54 U/L   Alkaline Phosphatase 57 38 - 126 U/L   Total Bilirubin 1.1 0.3 - 1.2 mg/dL   GFR calc non Af Amer >60 >60 mL/min   GFR calc Af Amer >60 >60 mL/min    Comment: (NOTE) The eGFR has been calculated using the CKD EPI equation. This calculation has not been validated in all clinical situations. eGFR's persistently <60 mL/min signify possible Chronic Kidney Disease.    Anion gap 6 5 - 15  Urinalysis, Routine w reflex microscopic     Status: Abnormal   Collection Time: 01/25/17  6:23 PM  Result Value Ref Range   Color, Urine AMBER (A) YELLOW    Comment: BIOCHEMICALS MAY BE AFFECTED BY COLOR   APPearance CLEAR CLEAR   Specific Gravity, Urine 1.033 (H) 1.005 - 1.030   pH 5.0 5.0 - 8.0   Glucose, UA NEGATIVE NEGATIVE mg/dL   Hgb urine dipstick SMALL (A) NEGATIVE   Bilirubin Urine NEGATIVE NEGATIVE   Ketones, ur NEGATIVE NEGATIVE mg/dL   Protein, ur NEGATIVE NEGATIVE mg/dL   Nitrite NEGATIVE NEGATIVE   Leukocytes, UA NEGATIVE NEGATIVE   RBC / HPF 0-5 0 - 5 RBC/hpf   WBC, UA 0-5 0 - 5 WBC/hpf   Bacteria, UA RARE (A) NONE SEEN   Squamous Epithelial / LPF 0-5 (A) NONE SEEN   Ca Oxalate Crys, UA PRESENT   I-Stat CG4 Lactic Acid, ED     Status: None   Collection Time: 01/25/17  6:25 PM  Result Value Ref Range   Lactic Acid, Venous 1.57 0.5 - 1.9 mmol/L   Dg Chest 2 View  Result Date: 01/25/2017 CLINICAL DATA:  Right-sided chest pain.  Shortness of breath. EXAM: CHEST  2 VIEW COMPARISON:  Radiograph 01/24/2013. Lung bases from abdominal CT earlier this day FINDINGS: Patchy opacity in the periphery of the right lung base, localizing to the lower lobe is seen on CT. Low lung volumes with crowding  of bronchovascular structures. Apparent increased size of the cardiac silhouette likely  secondary to technique, heart size upper normal and CT today. No pleural effusion or pneumothorax. Scoliotic curvature of spine. IMPRESSION: Patchy peripheral right lung base opacity. This could represent pneumonia in the appropriate clinical setting. If there is clinical concern for pulmonary embolus, recommend chest CTA. Electronically Signed   By: Melanie  Ehinger M.D.   On: 01/25/2017 22:00   Ct Abdomen Pelvis W Contrast  Result Date: 01/25/2017 CLINICAL DATA:  Right-sided chest and upper abdominal pain. Nausea. Recent kidney infection. Ovarian/fallopian/ peritoneal cancer suspected. Prior hysterectomy. EXAM: CT ABDOMEN AND PELVIS WITH CONTRAST TECHNIQUE: Multidetector CT imaging of the abdomen and pelvis was performed using the standard protocol following bolus administration of intravenous contrast. CONTRAST:  100mL ISOVUE-300 IOPAMIDOL (ISOVUE-300) INJECTION 61% COMPARISON:  12/31/2016 stone study. FINDINGS: Lower chest: Anterior right lower lobe airspace disease at the site of minimal scarring on the prior. There is also dependent and lingular presumed scarring. Mild cardiomegaly, without pericardial effusion. Tiny hiatal hernia. Hepatobiliary: Well-circumscribed right hepatic lobe low-density lesion is likely a cyst. Normal gallbladder, without biliary ductal dilatation. Pancreas: Normal, without mass or ductal dilatation. Spleen: Normal in size, without focal abnormality. Adrenals/Urinary Tract: Normal adrenal glands. Bilateral too small to characterize renal lesions. Normal urinary bladder. Stomach/Bowel: Normal stomach, without wall thickening. Colonic stool burden suggests constipation. Normal terminal ileum. Appendix not visualized. Normal small bowel. Vascular/Lymphatic: Normal caliber of the aorta and branch vessels. No retroperitoneal or retrocrural adenopathy. Increased number and size of pelvic sidewall lymph nodes. Index 1.2 cm right external iliac node is similar to on the prior exam. There  are enlarged inguinal nodes which are similar to on the recent priors. Reproductive: Hysterectomy.  No adnexal mass. Other: No significant free fluid. Prior ventral abdominal wall hernia repair. There is recurrent or residual small fat containing hernia inferior to the mesh. There is probable overlying scar/keloid. Musculoskeletal: Convex left lumbar spine curvature. IMPRESSION: 1.  No acute process in the abdomen or pelvis. 2.  Possible constipation. 3. Pelvic adenopathy, similar on recent prior exams. This could be reactive. Correlate with any primary malignancy history. Consider follow-up pelvic CT at 3-6 months. If any history of primary malignancy, consider PET. 4. Increased anterior right lung base opacity could represent an area of infection or given the clinical history of lower right chest pain, pulmonary infarct secondary to pulmonary embolism. If this is a concern, consider dedicated CTA of the chest. Electronically Signed   By: Kyle  Talbot M.D.   On: 01/25/2017 20:23    Pending Labs Unresulted Labs (From admission, onward)   Start     Ordered   01/26/17 0500  Magnesium  Tomorrow morning,   R     01/25/17 2253   01/25/17 2258  Influenza panel by PCR (type A & B)  Once,   R     01/25/17 2257   01/25/17 2257  Legionella Pneumophila Serogp 1 Ur Ag  Once,   R     01/25/17 2256   01/25/17 2257  Troponin I (q 6hr x 3)  Now then every 6 hours,   R     01/25/17 2257   01/25/17 2256  HIV antibody (Routine Testing)  Once,   R     01/25/17 2256   01/25/17 2256  Culture, sputum-assessment  Once,   R     01/25/17 2256   01/25/17 2256  Gram stain  Once,   R     01/25/17   2256   01/25/17 2256  Strep pneumoniae urinary antigen  Once,   R     01/25/17 2256   01/25/17 2255  hCG, quantitative, pregnancy  STAT,   R     01/25/17 2254   01/25/17 2255  D-dimer, quantitative (not at [facility])  STAT,   R     01/25/17 2254   01/25/17 2254  Sedimentation rate  Once,   R     01/25/17 2253   01/25/17 2254   C-reactive protein  Once,   R     01/25/17 2253   01/25/17 1752  Culture, blood (Routine X 2) w Reflex to ID Panel  BLOOD CULTURE X 2,   R     01/25/17 1752   01/25/17 1745  Urine Culture  STAT,   STAT     01/25/17 1744      Vitals/Pain Today's Vitals   01/25/17 2151 01/25/17 2152 01/25/17 2153 01/25/17 2158  BP:    (!) 155/88  Pulse: (!) 110 (!) 112 (!) 112 (!) 113  Resp: (!) 27 (!) 23 (!) 24 (!) 28  Temp:    99.5 F (37.5 C)  TempSrc:    Oral  SpO2: 100% 100% 96% 100%  Weight:      Height:      PainSc:        Isolation Precautions No active isolations  Medications Medications  0.9 %  sodium chloride infusion ( Intravenous New Bag/Given 01/25/17 2039)  levofloxacin (LEVAQUIN) IVPB 750 mg (not administered)  HYDROcodone-acetaminophen (NORCO/VICODIN) 5-325 MG per tablet 1 tablet (not administered)  aspirin chewable tablet 324 mg (not administered)  potassium chloride 20 MEQ/15ML (10%) solution 20 mEq (not administered)  enoxaparin (LOVENOX) injection 40 mg (not administered)  acetaminophen (TYLENOL) tablet 650 mg (not administered)  ondansetron (ZOFRAN) injection 4 mg (not administered)  hydrALAZINE (APRESOLINE) injection 5 mg (not administered)  zolpidem (AMBIEN) tablet 5 mg (not administered)  dextromethorphan-guaiFENesin (MUCINEX DM) 30-600 MG per 12 hr tablet 1 tablet (not administered)  levalbuterol (XOPENEX) nebulizer solution 1.25 mg (not administered)  sodium chloride 0.9 % bolus 1,000 mL (1,000 mLs Intravenous New Bag/Given 01/25/17 1907)  vancomycin (VANCOCIN) IVPB 1000 mg/200 mL premix (0 mg Intravenous Stopped 01/25/17 2055)  iopamidol (ISOVUE-300) 61 % injection (100 mLs Intravenous Contrast Given 01/25/17 1936)  diphenhydrAMINE (BENADRYL) injection 25 mg (25 mg Intravenous Given 01/25/17 2039)    Mobility non-ambulatory

## 2017-01-26 ENCOUNTER — Inpatient Hospital Stay (HOSPITAL_COMMUNITY): Payer: Self-pay

## 2017-01-26 ENCOUNTER — Encounter (HOSPITAL_COMMUNITY): Payer: Self-pay

## 2017-01-26 DIAGNOSIS — R079 Chest pain, unspecified: Secondary | ICD-10-CM

## 2017-01-26 DIAGNOSIS — R590 Localized enlarged lymph nodes: Secondary | ICD-10-CM | POA: Diagnosis present

## 2017-01-26 DIAGNOSIS — I2699 Other pulmonary embolism without acute cor pulmonale: Secondary | ICD-10-CM | POA: Diagnosis present

## 2017-01-26 LAB — D-DIMER, QUANTITATIVE: D-Dimer, Quant: 8.43 ug/mL-FEU — ABNORMAL HIGH (ref 0.00–0.50)

## 2017-01-26 LAB — APTT: aPTT: 133 seconds — ABNORMAL HIGH (ref 24–36)

## 2017-01-26 LAB — TROPONIN I
Troponin I: 0.03 ng/mL (ref <0.03)
Troponin I: 0.05 ng/mL (ref <0.03)
Troponin I: <0.03 ng/mL (ref <0.03)

## 2017-01-26 LAB — PROTIME-INR
INR: 1.47
Prothrombin Time: 17.7 seconds — ABNORMAL HIGH (ref 11.4–15.2)

## 2017-01-26 LAB — ECHOCARDIOGRAM COMPLETE
Height: 72 in
Weight: 4560 oz

## 2017-01-26 LAB — HIV ANTIBODY (ROUTINE TESTING W REFLEX): HIV Screen 4th Generation wRfx: NONREACTIVE

## 2017-01-26 LAB — SEDIMENTATION RATE: Sed Rate: 80 mm/hr — ABNORMAL HIGH (ref 0–22)

## 2017-01-26 LAB — HEPARIN LEVEL (UNFRACTIONATED)
Heparin Unfractionated: <0.1 IU/mL — ABNORMAL LOW (ref 0.30–0.70)
Heparin Unfractionated: <0.1 IU/mL — ABNORMAL LOW (ref 0.30–0.70)

## 2017-01-26 LAB — MRSA PCR SCREENING: MRSA by PCR: NEGATIVE

## 2017-01-26 LAB — INFLUENZA PANEL BY PCR (TYPE A & B)
Influenza A By PCR: NEGATIVE
Influenza B By PCR: NEGATIVE

## 2017-01-26 LAB — BRAIN NATRIURETIC PEPTIDE: B Natriuretic Peptide: 29.9 pg/mL (ref 0.0–100.0)

## 2017-01-26 LAB — MAGNESIUM: Magnesium: 1.7 mg/dL (ref 1.7–2.4)

## 2017-01-26 LAB — C-REACTIVE PROTEIN: CRP: 8.4 mg/dL — ABNORMAL HIGH (ref <1.0)

## 2017-01-26 LAB — STREP PNEUMONIAE URINARY ANTIGEN: Strep Pneumo Urinary Antigen: NEGATIVE

## 2017-01-26 MED ORDER — LEVALBUTEROL HCL 1.25 MG/0.5ML IN NEBU
1.2500 mg | INHALATION_SOLUTION | Freq: Three times a day (TID) | RESPIRATORY_TRACT | Status: DC
  Administered 2017-01-27 – 2017-01-28 (×3): 1.25 mg via RESPIRATORY_TRACT
  Filled 2017-01-26 (×4): qty 0.5

## 2017-01-26 MED ORDER — HEPARIN (PORCINE) IN NACL 100-0.45 UNIT/ML-% IJ SOLN
2500.0000 [IU]/h | INTRAMUSCULAR | Status: DC
  Filled 2017-01-26 (×2): qty 250

## 2017-01-26 MED ORDER — HEPARIN BOLUS VIA INFUSION
5000.0000 [IU] | Freq: Once | INTRAVENOUS | Status: AC
  Administered 2017-01-26: 5000 [IU] via INTRAVENOUS
  Filled 2017-01-26: qty 5000

## 2017-01-26 MED ORDER — SENNOSIDES-DOCUSATE SODIUM 8.6-50 MG PO TABS
1.0000 | ORAL_TABLET | Freq: Two times a day (BID) | ORAL | Status: DC
  Administered 2017-01-26 – 2017-02-02 (×14): 1 via ORAL
  Filled 2017-01-26 (×14): qty 1

## 2017-01-26 MED ORDER — LEVOFLOXACIN 750 MG PO TABS
750.0000 mg | ORAL_TABLET | Freq: Every day | ORAL | Status: AC
  Administered 2017-01-27 – 2017-01-31 (×5): 750 mg via ORAL
  Filled 2017-01-26 (×5): qty 1

## 2017-01-26 MED ORDER — POLYETHYLENE GLYCOL 3350 17 G PO PACK
17.0000 g | PACK | Freq: Every day | ORAL | Status: DC
  Administered 2017-01-26 – 2017-02-02 (×8): 17 g via ORAL
  Filled 2017-01-26 (×8): qty 1

## 2017-01-26 MED ORDER — HEPARIN BOLUS VIA INFUSION
3500.0000 [IU] | INTRAVENOUS | Status: AC
  Administered 2017-01-26: 3500 [IU] via INTRAVENOUS
  Filled 2017-01-26: qty 3500

## 2017-01-26 MED ORDER — HEPARIN (PORCINE) IN NACL 100-0.45 UNIT/ML-% IJ SOLN
2100.0000 [IU]/h | INTRAMUSCULAR | Status: DC
  Administered 2017-01-26: 1800 [IU]/h via INTRAVENOUS
  Administered 2017-01-26: 2100 [IU]/h via INTRAVENOUS
  Filled 2017-01-26 (×4): qty 250

## 2017-01-26 MED ORDER — HYDROCERIN EX CREA
TOPICAL_CREAM | Freq: Two times a day (BID) | CUTANEOUS | Status: DC
  Administered 2017-01-26 – 2017-02-01 (×13): via TOPICAL
  Administered 2017-02-01: 1 via TOPICAL
  Administered 2017-02-02: 12:00:00 via TOPICAL
  Filled 2017-01-26: qty 113

## 2017-01-26 MED ORDER — HEPARIN BOLUS VIA INFUSION
3500.0000 [IU] | Freq: Once | INTRAVENOUS | Status: AC
  Administered 2017-01-26: 3500 [IU] via INTRAVENOUS
  Filled 2017-01-26: qty 3500

## 2017-01-26 MED ORDER — IOPAMIDOL (ISOVUE-370) INJECTION 76%
INTRAVENOUS | Status: AC
  Administered 2017-01-26: 100 mL via INTRAVENOUS
  Filled 2017-01-26: qty 100

## 2017-01-26 MED ORDER — HEPARIN (PORCINE) IN NACL 100-0.45 UNIT/ML-% IJ SOLN
2100.0000 [IU]/h | INTRAMUSCULAR | Status: DC
  Administered 2017-01-26: 2100 [IU]/h via INTRAVENOUS
  Filled 2017-01-26 (×2): qty 250

## 2017-01-26 MED ORDER — IOPAMIDOL (ISOVUE-370) INJECTION 76%
100.0000 mL | Freq: Once | INTRAVENOUS | Status: AC | PRN
  Administered 2017-01-26: 100 mL via INTRAVENOUS

## 2017-01-26 MED ORDER — INFLUENZA VAC SPLIT QUAD 0.5 ML IM SUSY
0.5000 mL | PREFILLED_SYRINGE | INTRAMUSCULAR | Status: DC
  Filled 2017-01-26: qty 0.5

## 2017-01-26 MED ORDER — MORPHINE SULFATE (PF) 4 MG/ML IV SOLN
2.0000 mg | INTRAVENOUS | Status: DC | PRN
  Administered 2017-01-30 – 2017-02-02 (×3): 2 mg via INTRAVENOUS
  Filled 2017-01-26 (×3): qty 1

## 2017-01-26 NOTE — Significant Event (Signed)
Rapid Response Event Note  Overview: Time Called: 1945 Arrival Time: 1948 Event Type: Respiratory  Initial Focused Assessment: RR called ref pt c/o increased SOB.  Pt states she felt more SOB after talking to her mother on the phone and felt "funny".  Pt c/a/o, answers all questions appropriately, does not appear to be in any acute distress.  Lungs sound clear, diminished in RLL.  Pt + for PE in same location with chest xray confirming small infiltrate.  Pt vss as listed and stable, unchanged throughout course of day.  Pt ST rate 104, temp trending down.  O2 applied, and RT administered breathing treatment.  Gave pt IS and instructions on how to use it.  Pt states she felt better.     Interventions:  Plan of Care (if not transferred):  Pt remains in room, advised RN to call if any other concerns.  Event Summary:   at      at          [facility]

## 2017-01-26 NOTE — Progress Notes (Signed)
Pt called out stating she feels short of breath, not able to take deep breaths. Respiratory called for breathing treatment. Rapid Response called for change since patient has been diagnosed with and is on heparin drip for PE. O2 sat taken, measuring around 97% on room air with pulse at 108. Pt is diminished upon auscultation, only taking shallow breaths. Will continue to monitor closely at this time.

## 2017-01-26 NOTE — Progress Notes (Addendum)
Patient started to complain of short of breath. Respiratory called for breathing treatment. RN at bedside. Rapid Response called.

## 2017-01-26 NOTE — Progress Notes (Signed)
ANTICOAGULATION CONSULT NOTE - Initial Consult  Pharmacy Consult for IV heparin Indication: pulmonary embolus  Allergies  Allergen Reactions  . Augmentin [Amoxicillin-Pot Clavulanate] Hives and Itching    Did PCN reaction causing immediate rash, facial/tongue/throat swelling, SOB or lightheadedness with hypotension: no Did PCN reaction causing severe rash involving mucus membranes or skin necrosis: no Has patient had a PCN reaction that required hospitalization: in hospital Has patient had a PCN reaction occurring within the last 10 years: no If all of the above answers are "NO", then may proceed with Cephalosporin use.  Pt reports no recollection of any reactions when taking penicillin in past  . Latex Hives    Local reaction (welts). Patient denies any wheezing or other reaction with latex exposure    Patient Measurements: Height: 6' (182.9 cm) Weight: 285 lb (129.3 kg) IBW/kg (Calculated) : 73.1 Heparin Dosing Weight: 89 kg  Vital Signs: Temp: 99.8 F (37.7 C) (11/30 0034) Temp Source: Oral (11/30 0034) BP: 171/85 (11/30 0034) Pulse Rate: 112 (11/30 0034)  Labs: Recent Labs    01/25/17 1814 01/26/17 0000  HGB 10.0*  --   HCT 32.0*  --   PLT 375  --   CREATININE 0.89  --   TROPONINI  --  <0.03    Estimated Creatinine Clearance: 116.7 mL/min (by C-G formula based on SCr of 0.89 mg/dL).   Medical History: Past Medical History:  Diagnosis Date  . Anemia   . Anxiety   . Asthma    hx as child - no prob as adult - no inhaler  . Blood transfusion 01/22/11   transfusion 2 units at [facility]  . Depression 08/2010   psych assessment  . Dyspnea   . Fibroid   . Headache(784.0)    rx for imitrex - last one jan  . Keloid     Medications:  Scheduled:  . dextromethorphan-guaiFENesin  1 tablet Oral BID  . [START ON 01/27/2017] Influenza vac split quadrivalent PF  0.5 mL Intramuscular Tomorrow-1000  . levalbuterol  1.25 mg Nebulization Q6H   Infusions:  . sodium  chloride 125 mL/hr at 01/26/17 0114  . levofloxacin (LEVAQUIN) IV      Assessment: 44 yoF admitted with PNA now CT reveals + PE. IV heparin per Rx.  Baseline labs: H/H=10/32 and Plts=375 Coags pending Goal of Therapy:  Heparin level 0.3-0.7 units/ml Monitor platelets by anticoagulation protocol: Yes   Plan:  Baseline aptt and PT/INR STAT Heparin 5000 unit bolus x1 Start drip at 1800 units Daily CBC/HL Check 1st HL in 6 hours  Dorrene German 01/26/2017,4:24 AM

## 2017-01-26 NOTE — Progress Notes (Signed)
  Echocardiogram 2D Echocardiogram has been performed.  [name] M 01/26/2017, 1:50 PM

## 2017-01-26 NOTE — Progress Notes (Signed)
CRITICAL VALUE ALERT  Critical Value:  Troponin 0.03  Date & Time Notied:  01/26/2017 8063  Provider Notified: XU  Orders Received/Actions taken: no orders received.

## 2017-01-26 NOTE — Consult Note (Signed)
[facility] Nurse wound consult note Reason for Consult:Perineal skin damage Wound type: MASD Pressure Injury POA:NA Measurement:NA entire buttocks and upper and medial thigh area with dozens of open areas Wound bed: pink Drainage (amount, consistency, odor) none Periwound:MASD Dressing procedure/placement/frequency:First admission for this pt who is obese and sedimentary. Ambulates with a cane, is incontinent of urine, wears a sanitary pad at home and mostly stays in bed or chair. Patient has multiple keloid scars and both feet are edematous, and extremely dry and scaly. I have provided nurses with orders for Purple top Criticaid Barrier, and Eucerin for bilateral foot care.  Patient has Purewick at present so moisture will not be a problem while in hospital. Talked to patient about using female urinal that would catch the urine and keep it away from skin rather than a pad that holds it in. Discussed that with bedside staff. Due to obesity, mobility status and MASD will order a Bariatric bed with low air loss feature. Pt does not have intertrigenous moisture. We will not follow, but will remain available to this patient, to nursing, and the medical and/or surgical teams.  Please re-consult if we need to assist further.   Fara Olden, RN-C, WTA-C Wound Treatment Associate

## 2017-01-26 NOTE — Progress Notes (Signed)
*  PRELIMINARY RESULTS* Vascular Ultrasound Bilateral lower extremity venous duplex has been completed.  Preliminary findings: No evidence of deep vein thrombosis in the visualized veins of the lower extremity.  Negative for baker's cysts bilaterally. Somewhat limited due to patient body habitus and penetration.  Multiple enlarged lymph nodes bilaterally.   Everrett Coombe 01/26/2017, 2:24 PM

## 2017-01-26 NOTE — Progress Notes (Signed)
ANTICOAGULATION CONSULT NOTE - Initial Consult  Pharmacy Consult for IV heparin Indication: pulmonary embolus  Allergies  Allergen Reactions  . Augmentin [Amoxicillin-Pot Clavulanate] Hives and Itching    Did PCN reaction causing immediate rash, facial/tongue/throat swelling, SOB or lightheadedness with hypotension: no Did PCN reaction causing severe rash involving mucus membranes or skin necrosis: no Has patient had a PCN reaction that required hospitalization: in hospital Has patient had a PCN reaction occurring within the last 10 years: no If all of the above answers are "NO", then may proceed with Cephalosporin use.  Pt reports no recollection of any reactions when taking penicillin in past  . Latex Hives    Local reaction (welts). Patient denies any wheezing or other reaction with latex exposure    Patient Measurements: Height: 6' (182.9 cm) Weight: 285 lb (129.3 kg) IBW/kg (Calculated) : 73.1 Heparin Dosing Weight: 89 kg  Vital Signs: Temp: 99.7 F (37.6 C) (11/30 1148) Temp Source: Oral (11/30 1148) BP: 125/57 (11/30 0518) Pulse Rate: 118 (11/30 0518)  Labs: Recent Labs    01/25/17 1814 01/26/17 0000 01/26/17 0617 01/26/17 1124  HGB 10.0*  --   --   --   HCT 32.0*  --   --   --   PLT 375  --   --   --   APTT  --   --  133*  --   LABPROT  --   --  17.7*  --   INR  --   --  1.47  --   HEPARINUNFRC  --   --   --  <0.10*  CREATININE 0.89  --   --   --   TROPONINI  --  <0.03 0.03* 0.05*    Estimated Creatinine Clearance: 116.7 mL/min (by C-G formula based on SCr of 0.89 mg/dL).   Medical History: Past Medical History:  Diagnosis Date  . Anemia   . Anxiety   . Asthma    hx as child - no prob as adult - no inhaler  . Blood transfusion 01/22/11   transfusion 2 units at [facility]  . Depression 08/2010   psych assessment  . Dyspnea   . Fibroid   . Headache(784.0)    rx for imitrex - last one jan  . Keloid     Medications:  Scheduled:  .  dextromethorphan-guaiFENesin  1 tablet Oral BID  . hydrocerin   Topical BID  . [START ON 01/27/2017] Influenza vac split quadrivalent PF  0.5 mL Intramuscular Tomorrow-1000  . levalbuterol  1.25 mg Nebulization Q6H   Infusions:  . sodium chloride 125 mL/hr at 01/26/17 0700  . heparin 1,800 Units/hr (01/26/17 0515)  . levofloxacin (LEVAQUIN) IV      Assessment: 30 yoF admitted with PNA now CT reveals + PE. IV heparin per Rx.  Baseline labs: H/H=10/32 and Plts=375 Baseline APTT/PT elevated  01/26/2017 1st HL <0.1 subtherapeutic No issues per RN  Goal of Therapy:  Heparin level 0.3-0.7 units/ml Monitor platelets by anticoagulation protocol: Yes   Plan:  Heparin 3500 unit bolus x1 increase drip to 2100 units Daily CBC/HL Check 1st HL in 6 hours  Dolly Rias RPh 01/26/2017, 1:02 PM Pager [phone]

## 2017-01-26 NOTE — Progress Notes (Signed)
PROGRESS NOTE  [name] DVV:[id number] DOB: 1965/05/14 DOA: 01/25/2017 PCP: Patient, No Pcp Per  HPI/Recap of past 24 hours:  Right-sided chest pain  Assessment/Plan: Principal Problem:   PE (pulmonary thromboembolism) (HCC) Active Problems:   Anemia   Urinary incontinence   Hypokalemia   Lobar pneumonia (HCC)   Pressure ulcer of sacral region, stage 2   Axillary lymphadenopathy   PE (pulmonary thromboembolism) ([place]):   -patient presented to the ED due to concerning urinary incontinence and associated skin damage -she reports while staying in the ED , she developed  Right sided chest pain -CTA showed PE in subsegmental right lower lobe and possibly lingula.  -She does have sinus tachycardia, no hypoxia, blood pressure stable.  -she is started on heparin drip, and abx due to  a mild leukocytosis and a fever, cannot completely rule out lobar pneumonia. -echo /venos doppler pending -continue heparin drip and levaquin ( change to oral levaquin), currently no cough, right-sided pleuritic chest pain is improving   Moisture associated skin damage  Wound care input appreciated  Per wound care "perineal skin damage, entire buttocks and upper and medial thigh area with dozens of open areas Ambulates with a cane, is incontinent of urine, wears a sanitary pad at home and mostly stays in bed or chair " Patient has Purewick at present so moisture will not be a problem while in hospital. Talked to patient about using female urinal that would catch the urine and keep it away from skin rather than a pad that holds it in"  Hypokalemia: K= 3.4 on admission. - Replece - Check Mg level  Urinary incontinence: Etiology is not clear. Seems to be related to recent urinary tract infection. -Observe for now.   Chronic constipation: she reports only moves her bowel once a few weeks She denies ab pain, no n/v.  FTT, progressive weakness for the last two months, due to chronic knee  pain and progressive bilateral lower extremity edema. She has been needing a walker, but recently not able to ambulate She reports does not have pmd, no insurance.  Will need to get physical therapy once stable from PE stand point  Anemia: hgb stable. 10.0 -f/u by CBC   Axillary lymphadenopathy: as shown by CTA. Need to r/o breast cancer. on ct ab: elvic adenopathy, similar on recent prior exams. This could be reactive. Correlate with any primary malignancy history. Consider follow-up pelvic CT at 3-6 months. If any history of primary malignancy, consider PET. -will need to get mammogram as outpatient -May give referral to oncologist   Body mass index is 38.65 kg/m.     Code Status: full  Family Communication: patient   Disposition Plan: not ready for discharge   Consultants:  Wound care  Procedures:  none  Antibiotics:  levaquin   Objective: BP 137/64 (BP Location: Left Arm)   Pulse (!) 103   Temp 99.4 F (37.4 C) (Oral)   Resp (!) 24   Ht 6' (1.829 m)   Wt 129.3 kg (285 lb)   LMP 01/31/2011   SpO2 97%   BMI 38.65 kg/m   Intake/Output Summary (Last 24 hours) at 01/26/2017 1641 Last data filed at 01/26/2017 1500 Gross per 24 hour  Intake 3456.2 ml  Output 100 ml  Net 3356.2 ml   Filed Weights   01/25/17 1852  Weight: 129.3 kg (285 lb)    Exam: Patient is examined daily including today on 01/26/2017, exams remain the same as of yesterday except  that has changed    General:  NAD, flat affect, obese  Cardiovascular: tachycardia has improved  Respiratory: CTABL  Abdomen: Soft/ND/NT, positive BS  Musculoskeletal: bilateral lower extremity Edema (chronic lymphaedma appearance)  Neuro: alert, oriented   Data Reviewed: Basic Metabolic Panel: Recent Labs  Lab 01/25/17 1814 01/26/17 0617  NA 138  --   K 3.4*  --   CL 106  --   CO2 26  --   GLUCOSE 110*  --   BUN 13  --   CREATININE 0.89  --   CALCIUM 9.6  --   MG  --  1.7    Liver Function Tests: Recent Labs  Lab 01/25/17 1814  AST 24  ALT 19  ALKPHOS 57  BILITOT 1.1  PROT 8.2*  ALBUMIN 3.8   No results for input(s): LIPASE, AMYLASE in the last 168 hours. No results for input(s): AMMONIA in the last 168 hours. CBC: Recent Labs  Lab 01/25/17 1814  WBC 10.8*  NEUTROABS 7.8*  HGB 10.0*  HCT 32.0*  MCV 89.1  PLT 375   Cardiac Enzymes:   Recent Labs  Lab 01/26/17 0000 01/26/17 0617 01/26/17 1124  TROPONINI <0.03 0.03* 0.05*   BNP (last 3 results) Recent Labs    01/26/17 0617  BNP 29.9    ProBNP (last 3 results) No results for input(s): PROBNP in the last 8760 hours.  CBG: No results for input(s): GLUCAP in the last 168 hours.  Recent Results (from the past 240 hour(s))  Culture, blood (Routine X 2) w Reflex to ID Panel     Status: None (Preliminary result)   Collection Time: 01/25/17  6:17 PM  Result Value Ref Range Status   Specimen Description BLOOD LEFT ANTECUBITAL  Final   Special Requests   Final    BOTTLES DRAWN AEROBIC AND ANAEROBIC Blood Culture adequate volume   Culture   Final    NO GROWTH < 24 HOURS Performed at [facility], [address], [place], [place] [address]    Report Status PENDING  Incomplete     Studies: Dg Chest 2 View  Result Date: 01/25/2017 CLINICAL DATA:  Right-sided chest pain.  Shortness of breath. EXAM: CHEST  2 VIEW COMPARISON:  Radiograph 01/24/2013. Lung bases from abdominal CT earlier this day FINDINGS: Patchy opacity in the periphery of the right lung base, localizing to the lower lobe is seen on CT. Low lung volumes with crowding of bronchovascular structures. Apparent increased size of the cardiac silhouette likely secondary to technique, heart size upper normal and CT today. No pleural effusion or pneumothorax. Scoliotic curvature of spine. IMPRESSION: Patchy peripheral right lung base opacity. This could represent pneumonia in the appropriate clinical setting. If there is  clinical concern for pulmonary embolus, recommend chest CTA. Electronically Signed   By: Jeb Levering M.D.   On: 01/25/2017 22:00   Ct Angio Chest Pe W Or Wo Contrast  Result Date: 01/26/2017 CLINICAL DATA:  Right chest pain. EXAM: CT ANGIOGRAPHY CHEST WITH CONTRAST TECHNIQUE: Multidetector CT imaging of the chest was performed using the standard protocol during bolus administration of intravenous contrast. Multiplanar CT image reconstructions and MIPs were obtained to evaluate the vascular anatomy. CONTRAST:  100 cc Isovue 370 IV COMPARISON:  Radiographs and abdominal CT yesterday. FINDINGS: Cardiovascular: Technically limited exam due to breathing motion artifact, contrast bolus timing and soft tissue attenuation from habitus, however there are filling defects within the subsegmental right lower lobe pulmonary arteries. Possible filling defect within  a subsegmental lingular pulmonary artery. More detailed assessment is limited. RV to LV ratio ratio of 1. No pericardial effusion, minimal pericardial fluid is physiologic. Mediastinum/Nodes: No enlarged mediastinal or hilar nodes. Left axillary adenopathy with rounded morphology, dominant node measuring 2.4 cm short axis. No right axillary adenopathy. Small hiatal hernia. Visualized thyroid gland is normal. Lungs/Pleura: Peripheral consolidation in the anterior right lower lobe. Minimal peripheral patchy consolidation in the anterior lingula. Small right pleural effusion. No pulmonary edema. Breathing motion artifact limits more detailed assessment. Upper Abdomen: Assessed on abdominal CT earlier this day. No acute abnormality. Musculoskeletal: Scoliosis and degenerative change in the spine. Possibly exophytic soft tissue density in the subcutaneous tissues about the right jaw. Review of the MIP images confirms the above findings. IMPRESSION: 1. Positive for pulmonary embolus in the subsegmental right lower lobe and possibly lingula. Probable pulmonary  infarcts in the right middle lobe and lingula. Small right pleural effusion has developed from abdominal CT yesterday. 2. Pathologic left axillary adenopathy, largest node measuring 2.4 cm short axis. Recommend mammographic evaluation to evaluate for breast malignancy. 3. Soft tissue density, possible exophytic skin lesion superficial to the right jaw. This is amenable to direct inspection/physical exam. Critical Value/emergent results were called by telephone at the time of interpretation on 01/26/2017 at 4:09 am to Dr. Ivor Costa , who verbally acknowledged these results. Electronically Signed   By: Jeb Levering M.D.   On: 01/26/2017 04:10   Ct Abdomen Pelvis W Contrast  Result Date: 01/25/2017 CLINICAL DATA:  Right-sided chest and upper abdominal pain. Nausea. Recent kidney infection. Ovarian/fallopian/ peritoneal cancer suspected. Prior hysterectomy. EXAM: CT ABDOMEN AND PELVIS WITH CONTRAST TECHNIQUE: Multidetector CT imaging of the abdomen and pelvis was performed using the standard protocol following bolus administration of intravenous contrast. CONTRAST:  138mL ISOVUE-300 IOPAMIDOL (ISOVUE-300) INJECTION 61% COMPARISON:  12/31/2016 stone study. FINDINGS: Lower chest: Anterior right lower lobe airspace disease at the site of minimal scarring on the prior. There is also dependent and lingular presumed scarring. Mild cardiomegaly, without pericardial effusion. Tiny hiatal hernia. Hepatobiliary: Well-circumscribed right hepatic lobe low-density lesion is likely a cyst. Normal gallbladder, without biliary ductal dilatation. Pancreas: Normal, without mass or ductal dilatation. Spleen: Normal in size, without focal abnormality. Adrenals/Urinary Tract: Normal adrenal glands. Bilateral too small to characterize renal lesions. Normal urinary bladder. Stomach/Bowel: Normal stomach, without wall thickening. Colonic stool burden suggests constipation. Normal terminal ileum. Appendix not visualized. Normal small  bowel. Vascular/Lymphatic: Normal caliber of the aorta and branch vessels. No retroperitoneal or retrocrural adenopathy. Increased number and size of pelvic sidewall lymph nodes. Index 1.2 cm right external iliac node is similar to on the prior exam. There are enlarged inguinal nodes which are similar to on the recent priors. Reproductive: Hysterectomy.  No adnexal mass. Other: No significant free fluid. Prior ventral abdominal wall hernia repair. There is recurrent or residual small fat containing hernia inferior to the mesh. There is probable overlying scar/keloid. Musculoskeletal: Convex left lumbar spine curvature. IMPRESSION: 1.  No acute process in the abdomen or pelvis. 2.  Possible constipation. 3. Pelvic adenopathy, similar on recent prior exams. This could be reactive. Correlate with any primary malignancy history. Consider follow-up pelvic CT at 3-6 months. If any history of primary malignancy, consider PET. 4. Increased anterior right lung base opacity could represent an area of infection or given the clinical history of lower right chest pain, pulmonary infarct secondary to pulmonary embolism. If this is a concern, consider dedicated CTA of the chest. Electronically Signed  By: Abigail Miyamoto M.D.   On: 01/25/2017 20:23    Scheduled Meds: . dextromethorphan-guaiFENesin  1 tablet Oral BID  . hydrocerin   Topical BID  . [START ON 01/27/2017] Influenza vac split quadrivalent PF  0.5 mL Intramuscular Tomorrow-1000  . levalbuterol  1.25 mg Nebulization Q6H  . polyethylene glycol  17 g Oral Daily  . senna-docusate  1 tablet Oral BID    Continuous Infusions: . heparin 2,100 Units/hr (01/26/17 1337)  . levofloxacin (LEVAQUIN) IV       Time spent: 20mins I have personally reviewed and interpreted on  01/26/2017 daily labs, tele strips, imagings as discussed above under date review session and assessment and plans.  I reviewed all nursing notes, pharmacy notes, wound care notes,  vitals,  pertinent old records  I have discussed plan of care as described above with RN , patient on 01/26/2017   Florencia Reasons MD, PhD  [facility] Pager [phone]. If 7PM-7AM, please contact night-coverage at [email or web address], password [facility] 01/26/2017, 4:41 PM  LOS: 1 day

## 2017-01-26 NOTE — Progress Notes (Addendum)
ANTICOAGULATION CONSULT NOTE - Follow Up Consult  Pharmacy Consult for Heparin Indication: pulmonary embolus   Heparin dosing weight 102.7 kg  Medications:  Infusions:  . heparin 2,100 Units/hr (01/26/17 1337)    Assessment: 70 yoF admitted on 11/29 with pneumonia and PE.  Pharmacy is consulted to dose Heparin infusion.  Heparin level < 0.1, remains subtherapeutic despite bolus and rate increase No bleeding or complications reported.  Rn reports that IV is infusing correctly.  Goal of Therapy:  Heparin level 0.3-0.7 units/ml Monitor platelets by anticoagulation protocol: Yes   Plan:   Give heparin 3500 units bolus IV x 1  Increase to heparin IV infusion at 2500 units/hr  Heparin level 6 hours after rate change  Daily heparin level and CBC  Continue to monitor H&H and platelets   Gretta Arab PharmD, BCPS Pager [phone] 01/26/2017 7:45 PM    Addendum: RN called at 20:36 to report that when changing the heparin infusion rate, she noticed that the IV had likely infiltrated.  Unknown duration, but arm feels 'tight' and she suspects that no heparin was infusing.  No immediate complications.    Plan: RN to obtain replacement IV site Give heparin 3500 units bolus IV x 1 ASAP Continue previous heparin IV infusion of 2100 units/hr  Gretta Arab PharmD, BCPS Pager [phone] 01/26/2017 8:38 PM

## 2017-01-27 ENCOUNTER — Inpatient Hospital Stay (HOSPITAL_COMMUNITY): Payer: Self-pay

## 2017-01-27 LAB — COMPREHENSIVE METABOLIC PANEL
ALT: 14 U/L (ref 14–54)
AST: 20 U/L (ref 15–41)
Albumin: 2.9 g/dL — ABNORMAL LOW (ref 3.5–5.0)
Alkaline Phosphatase: 48 U/L (ref 38–126)
Anion gap: 5 (ref 5–15)
BUN: 6 mg/dL (ref 6–20)
CO2: 25 mmol/L (ref 22–32)
Calcium: 8.8 mg/dL — ABNORMAL LOW (ref 8.9–10.3)
Chloride: 105 mmol/L (ref 101–111)
Creatinine, Ser: 0.78 mg/dL (ref 0.44–1.00)
GFR calc Af Amer: >60 mL/min (ref >60)
GFR calc non Af Amer: >60 mL/min (ref >60)
Glucose, Bld: 113 mg/dL — ABNORMAL HIGH (ref 65–99)
Potassium: 3.5 mmol/L (ref 3.5–5.1)
Sodium: 135 mmol/L (ref 135–145)
Total Bilirubin: 1 mg/dL (ref 0.3–1.2)
Total Protein: 7.1 g/dL (ref 6.5–8.1)

## 2017-01-27 LAB — MAGNESIUM: Magnesium: 1.7 mg/dL (ref 1.7–2.4)

## 2017-01-27 LAB — CBC
HCT: 30.1 % — ABNORMAL LOW (ref 36.0–46.0)
Hemoglobin: 10 g/dL — ABNORMAL LOW (ref 12.0–15.0)
MCH: 28.8 pg (ref 26.0–34.0)
MCHC: 33.2 g/dL (ref 30.0–36.0)
MCV: 86.7 fL (ref 78.0–100.0)
Platelets: 283 10*3/uL (ref 150–400)
RBC: 3.47 MIL/uL — ABNORMAL LOW (ref 3.87–5.11)
RDW: 14 % (ref 11.5–15.5)
WBC: 10.6 10*3/uL — ABNORMAL HIGH (ref 4.0–10.5)

## 2017-01-27 LAB — HEPARIN LEVEL (UNFRACTIONATED)
Heparin Unfractionated: 0.77 IU/mL — ABNORMAL HIGH (ref 0.30–0.70)
Heparin Unfractionated: 0.71 IU/mL — ABNORMAL HIGH (ref 0.30–0.70)
Heparin Unfractionated: 0.57 IU/mL (ref 0.30–0.70)

## 2017-01-27 LAB — URINE CULTURE

## 2017-01-27 LAB — TSH: TSH: 2.483 u[IU]/mL (ref 0.350–4.500)

## 2017-01-27 MED ORDER — HEPARIN (PORCINE) IN NACL 100-0.45 UNIT/ML-% IJ SOLN
1900.0000 [IU]/h | INTRAMUSCULAR | Status: AC
  Administered 2017-01-27 – 2017-01-31 (×6): 1900 [IU]/h via INTRAVENOUS
  Filled 2017-01-27 (×12): qty 250

## 2017-01-27 MED ORDER — FUROSEMIDE 10 MG/ML IJ SOLN
40.0000 mg | Freq: Once | INTRAMUSCULAR | Status: AC
  Administered 2017-01-27: 40 mg via INTRAVENOUS
  Filled 2017-01-27: qty 4

## 2017-01-27 MED ORDER — POTASSIUM CHLORIDE CRYS ER 20 MEQ PO TBCR
40.0000 meq | EXTENDED_RELEASE_TABLET | Freq: Once | ORAL | Status: AC
  Administered 2017-01-27: 40 meq via ORAL
  Filled 2017-01-27: qty 2

## 2017-01-27 MED ORDER — HEPARIN (PORCINE) IN NACL 100-0.45 UNIT/ML-% IJ SOLN
2000.0000 [IU]/h | INTRAMUSCULAR | Status: DC
  Administered 2017-01-27 (×2): 2000 [IU]/h via INTRAVENOUS
  Filled 2017-01-27 (×2): qty 250

## 2017-01-27 NOTE — Progress Notes (Signed)
Pt has returned from MRI unable to 'breathe" lying down. MRI was unable to be done. VS WNL Pt likely anxious. Resting quietly in bed. T99.5 oral P107 R 28 156/83 99% room air

## 2017-01-27 NOTE — Progress Notes (Addendum)
ANTICOAGULATION CONSULT NOTE - Follow Up Consult  Pharmacy Consult for Heparin Indication: pulmonary embolus   Heparin dosing weight 102.7 kg  Medications:  Infusions:  . heparin 2,000 Units/hr (01/27/17 8841)    Assessment: 12 yoF admitted on 11/29 with pneumonia and PE.  Pharmacy is consulted to dose Heparin infusion.  1031 HL=0.71 remains slightly above goal despite reducing infusion rate to 2000 units/hr. Hgb low/stable. Platelets WNL. No bleeding or complications reported.    Goal of Therapy:  Heparin level 0.3-0.7 units/ml Monitor platelets by anticoagulation protocol: Yes   Plan:   Decrease heparin drip to 1900 units/hr (19 ml/hr)  Recheck HL in 6 hours  Daily heparin level and CBC  Continue to monitor H&H and platelets   [name] 01/27/2017 11:06 AM  ADDENDUM 01/27/2017 7:31 PM Heparin level this evening now therapeutic with infusion at 1900 units/hr.  Plan: Continue heparin infusion at 1900 units/hr. Daily HL and CBC.  [name], PharmD, BCPS Pager: [phone] 01/27/2017 7:32 PM

## 2017-01-27 NOTE — Progress Notes (Signed)
ANTICOAGULATION CONSULT NOTE - Follow Up Consult  Pharmacy Consult for Heparin Indication: pulmonary embolus   Heparin dosing weight 102.7 kg  Medications:  Infusions:  . heparin      Assessment: 39 yoF admitted on 11/29 with pneumonia and PE.  Pharmacy is consulted to dose Heparin infusion.  0256 HL=0.77 slightly above goal. No bleeding or complications reported.  Rn reports that IV is infusing correctly.  Goal of Therapy:  Heparin level 0.3-0.7 units/ml Monitor platelets by anticoagulation protocol: Yes   Plan:   Decrease heparin drip to 2000 units/hr (20 ml/hr)  Recheck HL in 6 hours  Daily heparin level and CBC  Continue to monitor H&H and platelets    [name] 01/27/2017 3:42 AM

## 2017-01-27 NOTE — Progress Notes (Addendum)
Pharmacy Antibiotic Note  [name] is a 51 y.o. female admitted on 01/25/2017 with pneumonia.  Pharmacy has been consulted for Levaquin dosing.  D2 Levaquin 750 mg q24h.  Patient with fever this AM.  WBC slightly elevated.  SCr WNL and stable.  Plan: Continue Levaquin 750 mg PO q24h. Dosage remains stable and need for further dosage adjustment appears unlikely at present.  Pharmacy will sign off at this time.  Please reconsult if a change in clinical status warrants re-evaluation of dosage.  Height: 6' (182.9 cm) Weight: 285 lb (129.3 kg) IBW/kg (Calculated) : 73.1  Temp (24hrs), Avg:100.1 F (37.8 C), Min:99.4 F (37.4 C), Max:101.5 F (38.6 C)  Recent Labs  Lab 01/25/17 1814 01/25/17 1825 01/27/17 0256 01/27/17 1031  WBC 10.8*  --   --  10.6*  CREATININE 0.89  --  0.78  --   LATICACIDVEN  --  1.57  --   --     Estimated Creatinine Clearance: 129.8 mL/min (by C-G formula based on SCr of 0.78 mg/dL).    Allergies  Allergen Reactions  . Augmentin [Amoxicillin-Pot Clavulanate] Hives and Itching    Did PCN reaction causing immediate rash, facial/tongue/throat swelling, SOB or lightheadedness with hypotension: no Did PCN reaction causing severe rash involving mucus membranes or skin necrosis: no Has patient had a PCN reaction that required hospitalization: in hospital Has patient had a PCN reaction occurring within the last 10 years: no If all of the above answers are "NO", then may proceed with Cephalosporin use.  Pt reports no recollection of any reactions when taking penicillin in past  . Latex Hives    Local reaction (welts). Patient denies any wheezing or other reaction with latex exposure    Thank you for allowing pharmacy to be a part of this patient's care.  Hershal Coria 01/27/2017 11:10 AM

## 2017-01-27 NOTE — Progress Notes (Signed)
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  Assessment/Plan: Principal Problem:   PE (pulmonary thromboembolism) (HCC) Active Problems:   Anemia   Urinary incontinence   Hypokalemia   Lobar pneumonia (HCC)   Pressure ulcer of sacral region, stage 2   Axillary lymphadenopathy   fever  -Blood  Culture no growth, mrsa screen negative, urine culture + multiple species. Urine strep pneumo antigen negative -she does has skin irritation, PE could also cause fever -Continue levaquin for now  PE (pulmonary thromboembolism) ([place]):   -patient presented to the ED due to concerning urinary incontinence and associated skin damage -she reports while staying in the ED , she developed  Right sided chest pain -CTA showed PE in subsegmental right lower lobe and possibly lingula.  -She does have sinus tachycardia, no hypoxia, blood pressure stable.  -she is started on heparin drip, and abx due to  a mild leukocytosis and a fever, cannot completely rule out lobar pneumonia. -echo lvef wnl, + grade I diastolic dysfunction,  No wall motion abnormalities, right ventrical unremarkable, pulmonary artery unremarkable. venos doppler negative for DVT. -continue heparin drip and levaquin ( change to oral levaquin), currently no cough,  right-sided pleuritic chest pain is improving  Bilateral lower extremity edema/DOE: Diastolic chf exacerbation? Vs lymphaedma Trial of lasix, monitor bp and cr  Moisture associated skin damage  -Wound care input appreciated  Per wound care "perineal skin damage, entire buttocks and upper and medial thigh area with dozens of open areas Ambulates with a cane, is incontinent of urine, wears a sanitary pad at home and mostly stays in bed or chair " Patient has Purewick at present so moisture will not be a problem while in hospital. Talked to patient about using female urinal that would catch the urine and keep it away from skin rather than a pad that holds it in" She is on abx  Hypokalemia: K= 3.4 on admission. - Replece - Check Mg level   Back pain, progressive weakness with urinary incontinence: Mri lumber spine    Chronic constipation: she reports only moves her bowel once a few weeks She denies ab pain, no n/v.  FTT,  Patient reports gradual declined from 02/2016, she has to quit her job in august. progressive weakness for the last two months,  recently not able to ambulate She contribute this to chronic right knee pain and progressive bilateral lower extremity edema.   She reports does not have pmd, no insurance.  Will need to get physical therapy once stable from PE stand point  Anemia: hgb stable. 10.0 -f/u by CBC   Axillary lymphadenopathy: as shown by CTA. Need to r/o breast cancer. on ct ab: pelvic adenopathy, similar on recent prior exams. This could be reactive. Correlate with  any primary malignancy history. Consider follow-up pelvic CT at 3-6 months. If any history of primary malignancy, consider PET. -will need to get mammogram as outpatient -May give referral to oncologist   Body mass index is 38.65 kg/m.     Code Status: full  Family Communication: patient and mother at bedside  Disposition Plan: not ready for  discharge   Consultants:  Wound care  Procedures:  none  Antibiotics:  levaquin   Objective: BP 124/61 (BP Location: Left Arm)   Pulse (!) 108   Temp (!) 101.5 F (38.6 C) (Oral) Comment: nurse notified  Resp 18   Ht 6' (1.829 m)   Wt 129.3 kg (285 lb)   LMP 01/31/2011   SpO2 97%   BMI 38.65 kg/m   Intake/Output Summary (Last 24 hours) at 01/27/2017 4270 Last data filed at 01/27/2017 6237 Gross per 24 hour  Intake 2696.2 ml  Output 1250 ml  Net 1446.2 ml   Filed Weights   01/25/17 1852  Weight: 129.3 kg (285 lb)    Exam: Patient is examined daily including today on 01/27/2017, exams remain the same as of yesterday except that has changed    General:  NAD, flat affect, obese  Cardiovascular: remain slight sinus tachycardia, but improved compare to on admission  Respiratory: CTABL  Abdomen: Soft/ND/NT, positive BS  Musculoskeletal: bilateral lower extremity pitting Edema (chronic lymphaedma appearance), lower extremity barely able to lift against gravity, sensation intact  Neuro: alert, oriented   Data Reviewed: Basic Metabolic Panel: Recent Labs  Lab 01/25/17 1814 01/26/17 0617 01/27/17 0256  NA 138  --  135  K 3.4*  --  3.5  CL 106  --  105  CO2 26  --  25  GLUCOSE 110*  --  113*  BUN 13  --  6  CREATININE 0.89  --  0.78  CALCIUM 9.6  --  8.8*  MG  --  1.7 1.7   Liver Function Tests: Recent Labs  Lab 01/25/17 1814 01/27/17 0256  AST 24 20  ALT 19 14  ALKPHOS 57 48  BILITOT 1.1 1.0  PROT 8.2* 7.1  ALBUMIN 3.8 2.9*   No results for input(s): LIPASE, AMYLASE in the last 168 hours. No results for input(s): AMMONIA in the last 168 hours. CBC: Recent Labs  Lab 01/25/17 1814  WBC 10.8*  NEUTROABS 7.8*  HGB 10.0*  HCT 32.0*  MCV 89.1  PLT 375   Cardiac Enzymes:   Recent Labs  Lab 01/26/17 0000 01/26/17 0617 01/26/17 1124  TROPONINI <0.03 0.03* 0.05*   BNP (last 3 results) Recent Labs    01/26/17 0617  BNP 29.9     ProBNP (last 3 results) No results for input(s): PROBNP in the last 8760 hours.  CBG: No results for input(s): GLUCAP in the last 168 hours.  Recent Results (from the past 240 hour(s))  Culture, blood (Routine X 2) w Reflex to ID Panel     Status: None (Preliminary result)   Collection Time: 01/25/17  6:17 PM  Result Value Ref Range Status   Specimen Description BLOOD LEFT ANTECUBITAL  Final   Special Requests   Final    BOTTLES DRAWN AEROBIC AND ANAEROBIC Blood Culture adequate volume   Culture   Final    NO GROWTH < 24 HOURS Performed at [facility], [address], [place], [place] [address]    Report Status PENDING  Incomplete  MRSA PCR Screening     Status: None  Collection Time: 01/26/17  5:57 PM  Result Value Ref Range Status   MRSA by PCR NEGATIVE NEGATIVE Final    Comment:        The GeneXpert MRSA Assay (FDA approved for NASAL specimens only), is one component of a comprehensive MRSA colonization surveillance program. It is not intended to diagnose MRSA infection nor to guide or monitor treatment for MRSA infections.      Studies: No results found.  Scheduled Meds: . dextromethorphan-guaiFENesin  1 tablet Oral BID  . hydrocerin   Topical BID  . Influenza vac split quadrivalent PF  0.5 mL Intramuscular Tomorrow-1000  . levalbuterol  1.25 mg Nebulization TID  . levofloxacin  750 mg Oral Daily  . polyethylene glycol  17 g Oral Daily  . senna-docusate  1 tablet Oral BID    Continuous Infusions: . heparin 2,000 Units/hr (01/27/17 0343)     Time spent: 71mins I have personally reviewed and interpreted on  01/27/2017 daily labs, tele strips, imagings as discussed above under date review session and assessment and plans.  I reviewed all nursing notes, pharmacy notes, wound care notes,  vitals, pertinent old records  I have discussed plan of care as described above with RN , patient and family on 01/27/2017   Florencia Reasons MD, PhD  [facility] Pager [phone]. If 7PM-7AM, please contact night-coverage at [email or web address], password [facility] 01/27/2017, 8:12 AM  LOS: 2 days

## 2017-01-28 LAB — BASIC METABOLIC PANEL
Anion gap: 6 (ref 5–15)
BUN: 9 mg/dL (ref 6–20)
CO2: 26 mmol/L (ref 22–32)
Calcium: 9.2 mg/dL (ref 8.9–10.3)
Chloride: 105 mmol/L (ref 101–111)
Creatinine, Ser: 0.84 mg/dL (ref 0.44–1.00)
GFR calc Af Amer: >60 mL/min (ref >60)
GFR calc non Af Amer: >60 mL/min (ref >60)
Glucose, Bld: 120 mg/dL — ABNORMAL HIGH (ref 65–99)
Potassium: 3.7 mmol/L (ref 3.5–5.1)
Sodium: 137 mmol/L (ref 135–145)

## 2017-01-28 LAB — CBC
HCT: 30.2 % — ABNORMAL LOW (ref 36.0–46.0)
Hemoglobin: 9.7 g/dL — ABNORMAL LOW (ref 12.0–15.0)
MCH: 28 pg (ref 26.0–34.0)
MCHC: 32.1 g/dL (ref 30.0–36.0)
MCV: 87 fL (ref 78.0–100.0)
Platelets: 351 10*3/uL (ref 150–400)
RBC: 3.47 MIL/uL — ABNORMAL LOW (ref 3.87–5.11)
RDW: 13.9 % (ref 11.5–15.5)
WBC: 8.1 10*3/uL (ref 4.0–10.5)

## 2017-01-28 LAB — MAGNESIUM: Magnesium: 1.8 mg/dL (ref 1.7–2.4)

## 2017-01-28 LAB — HEPARIN LEVEL (UNFRACTIONATED): Heparin Unfractionated: 0.57 IU/mL (ref 0.30–0.70)

## 2017-01-28 MED ORDER — LEVALBUTEROL HCL 1.25 MG/0.5ML IN NEBU
1.2500 mg | INHALATION_SOLUTION | Freq: Two times a day (BID) | RESPIRATORY_TRACT | Status: DC
  Administered 2017-01-28 – 2017-01-31 (×6): 1.25 mg via RESPIRATORY_TRACT
  Filled 2017-01-28 (×6): qty 0.5

## 2017-01-28 MED ORDER — CLONAZEPAM 0.5 MG PO TABS
0.2500 mg | ORAL_TABLET | Freq: Once | ORAL | Status: AC
  Administered 2017-01-29: 0.25 mg via ORAL
  Filled 2017-01-28: qty 1

## 2017-01-28 MED ORDER — POTASSIUM CHLORIDE CRYS ER 20 MEQ PO TBCR
40.0000 meq | EXTENDED_RELEASE_TABLET | Freq: Once | ORAL | Status: AC
  Administered 2017-01-28: 40 meq via ORAL
  Filled 2017-01-28: qty 2

## 2017-01-28 MED ORDER — MAGNESIUM OXIDE 400 (241.3 MG) MG PO TABS
400.0000 mg | ORAL_TABLET | Freq: Every day | ORAL | Status: DC
  Administered 2017-01-28 – 2017-02-02 (×6): 400 mg via ORAL
  Filled 2017-01-28 (×6): qty 1

## 2017-01-28 MED ORDER — TRAMADOL HCL 50 MG PO TABS
50.0000 mg | ORAL_TABLET | Freq: Four times a day (QID) | ORAL | Status: AC | PRN
  Administered 2017-01-28 – 2017-01-29 (×3): 50 mg via ORAL
  Filled 2017-01-28 (×3): qty 1

## 2017-01-28 NOTE — Progress Notes (Signed)
ANTICOAGULATION CONSULT NOTE - Follow Up Consult  Pharmacy Consult for Heparin Indication: pulmonary embolus   Heparin dosing weight 102.7 kg  Medications:  Infusions:  . heparin 1,900 Units/hr (01/28/17 0940)    Assessment: 60 yoF admitted on 11/29 with pneumonia and PE.  Pharmacy is consulted to dose Heparin infusion.  Today, 01/28/2017: HL=0.57 therapeutic with infusion at 1900 units/hr. Hgb low/stable. Platelets WNL. No bleeding or complications reported.    Goal of Therapy:  Heparin level 0.3-0.7 units/ml Monitor platelets by anticoagulation protocol: Yes   Plan:   Continue heparin drip at 1900 units/hr (19 ml/hr)  Daily heparin level and CBC  Continue to monitor H&H and platelets   Hershal Coria 01/28/2017 9:51 AM

## 2017-01-28 NOTE — Progress Notes (Signed)
PROGRESS NOTE  [name] PNT:[id number] DOB: 05-18-1965 DOA: 01/25/2017 PCP: Patient, No Pcp Per  HPI/Recap of past 24 hours:  She could not get MRI done yesterday She report legs are less swollen, but start to have cramping after the lasix she reports Right-sided chest pain continue to improve  no cough, no sob at rest, she is on room air at rest  No fever last 24hrs  denies ab pain, no n/v   Assessment/Plan: Principal Problem:   PE (pulmonary thromboembolism) (HCC) Active Problems:   Anemia   Urinary incontinence   Hypokalemia   Lobar pneumonia (HCC)   Pressure ulcer of sacral region, stage 2   Axillary lymphadenopathy   fever (fever 101.5 on 12/1 4 am) -Blood  Culture no growth, mrsa screen negative, urine culture + multiple species. Urine strep pneumo antigen negative -she does has skin irritation, PE could also cause fever -Continue levaquin for now  PE (pulmonary thromboembolism) ([place]):   -patient presented to the ED due to concerning urinary incontinence and associated skin damage -she reports while staying in the ED , she developed  Right sided chest pain -CTA showed PE in subsegmental right lower lobe and possibly lingula.  -She does have sinus tachycardia, no hypoxia, blood pressure stable.  -she is started on heparin drip, and abx due to  a mild leukocytosis and a fever, cannot completely rule out lobar pneumonia. -echo lvef wnl, + grade I diastolic dysfunction,  No wall motion abnormalities, right ventrical unremarkable, pulmonary artery unremarkable. venos doppler negative for DVT. -continue heparin drip and levaquin ( change to oral levaquin), currently no cough, right-sided pleuritic chest pain is improving  Bilateral lower extremity edema/DOE: -Diastolic chf exacerbation? Vs lymphaedma -less edema after lasix on 12/1, will consider prn lasix  monitor bp and cr  Moisture associated skin damage  -Wound care input appreciated  Per wound care  "perineal skin damage, entire buttocks and upper and medial thigh area with dozens of open areas Ambulates with a cane, is incontinent of urine, wears a sanitary pad at home and mostly stays in bed or chair " Patient has Purewick at present so moisture will not be a problem while in hospital. Talked to patient about using female urinal that would catch the urine and keep it away from skin rather than a pad that holds it in" She is on abx  Hypokalemia/hypomagnesemia:  - Replace k/mag   Back pain, progressive weakness with urinary incontinence: Mri lumber spine    Chronic constipation: she reports only moves her bowel once a few weeks She denies ab pain, no n/v.  FTT,  Patient reports gradual declined from 02/2016, she has to quit her job in august. progressive weakness for the last two months,  recently not able to ambulate She contribute this to chronic right knee pain and progressive bilateral lower extremity edema.   She reports does not have pmd, no insurance.  Will need to get physical therapy once stable from PE stand point  Anemia: hgb stable. 10.0 -f/u by CBC   Axillary lymphadenopathy: as shown by CTA. Need to r/o breast cancer. on ct ab: pelvic adenopathy, similar on recent prior exams. This could be reactive. Correlate with any primary malignancy history. Consider follow-up pelvic CT at 3-6 months. If any history of primary malignancy, consider PET. -will need to get mammogram as outpatient -May give referral to oncologist   Body mass index is 38.65 kg/m.     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  Consultants:  Wound care  Procedures:  none  Antibiotics:  levaquin   Objective: BP 124/66 (BP Location: Right Arm)   Pulse (!) 102   Temp 99 F (37.2 C) (Oral)    Resp 18   Ht 6' (1.829 m)   Wt 129.3 kg (285 lb)   LMP 01/31/2011   SpO2 91%   BMI 38.65 kg/m   Intake/Output Summary (Last 24 hours) at 01/28/2017 0931 Last data filed at 01/28/2017 8527 Gross per 24 hour  Intake 440 ml  Output 3250 ml  Net -2810 ml   Filed Weights   01/25/17 1852  Weight: 129.3 kg (285 lb)    Exam: Patient is examined daily including today on 01/28/2017, exams remain the same as of yesterday except that has changed    General:  NAD, flat affect, obese  Cardiovascular: remain slight sinus tachycardia, but improved compare to on admission  Respiratory: CTABL  Abdomen: Soft/ND/NT, positive BS  Musculoskeletal: bilateral lower extremity pitting Edema is improving (chronic lymphaedma appearance), lower extremity barely able to lift against gravity, sensation intact  Neuro: alert, oriented   Data Reviewed: Basic Metabolic Panel: Recent Labs  Lab 01/25/17 1814 01/26/17 0617 01/27/17 0256 01/28/17 0601  NA 138  --  135 137  K 3.4*  --  3.5 3.7  CL 106  --  105 105  CO2 26  --  25 26  GLUCOSE 110*  --  113* 120*  BUN 13  --  6 9  CREATININE 0.89  --  0.78 0.84  CALCIUM 9.6  --  8.8* 9.2  MG  --  1.7 1.7 1.8   Liver Function Tests: Recent Labs  Lab 01/25/17 1814 01/27/17 0256  AST 24 20  ALT 19 14  ALKPHOS 57 48  BILITOT 1.1 1.0  PROT 8.2* 7.1  ALBUMIN 3.8 2.9*   No results for input(s): LIPASE, AMYLASE in the last 168 hours. No results for input(s): AMMONIA in the last 168 hours. CBC: Recent Labs  Lab 01/25/17 1814 01/27/17 1031 01/28/17 0601  WBC 10.8* 10.6* 8.1  NEUTROABS 7.8*  --   --   HGB 10.0* 10.0* 9.7*  HCT 32.0* 30.1* 30.2*  MCV 89.1 86.7 87.0  PLT 375 283 351   Cardiac Enzymes:   Recent Labs  Lab 01/26/17 0000 01/26/17 0617 01/26/17 1124  TROPONINI <0.03 0.03* 0.05*   BNP (last 3 results) Recent Labs    01/26/17 0617  BNP 29.9    ProBNP (last 3 results) No results for input(s): PROBNP in the last 8760  hours.  CBG: No results for input(s): GLUCAP in the last 168 hours.  Recent Results (from the past 240 hour(s))  Culture, blood (Routine X 2) w Reflex to ID Panel     Status: None (Preliminary result)   Collection Time: 01/25/17  6:17 PM  Result Value Ref Range Status   Specimen Description BLOOD LEFT ANTECUBITAL  Final   Special Requests   Final    BOTTLES DRAWN AEROBIC AND ANAEROBIC Blood Culture adequate volume   Culture   Final    NO GROWTH 2 DAYS Performed at [facility], [address], [place], [place] [address]    Report Status PENDING  Incomplete  Urine Culture     Status: Abnormal  Collection Time: 01/25/17  6:23 PM  Result Value Ref Range Status   Specimen Description URINE, CLEAN CATCH  Final   Special Requests NONE  Final   Culture MULTIPLE SPECIES PRESENT, SUGGEST RECOLLECTION (A)  Final   Report Status 01/27/2017 FINAL  Final  Culture, blood (Routine X 2) w Reflex to ID Panel     Status: None (Preliminary result)   Collection Time: 01/26/17  5:00 AM  Result Value Ref Range Status   Specimen Description BLOOD RIGHT ANTECUBITAL  Final   Special Requests   Final    BOTTLES DRAWN AEROBIC AND ANAEROBIC Blood Culture adequate volume   Culture   Final    NO GROWTH 1 DAY Performed at [facility], [place] [address], [place], [place] [address]    Report Status PENDING  Incomplete  MRSA PCR Screening     Status: None   Collection Time: 01/26/17  5:57 PM  Result Value Ref Range Status   MRSA by PCR NEGATIVE NEGATIVE Final    Comment:        The GeneXpert MRSA Assay (FDA approved for NASAL specimens only), is one component of a comprehensive MRSA colonization surveillance program. It is not intended to diagnose MRSA infection nor to guide or monitor treatment for MRSA infections.      Studies: No results found.  Scheduled Meds: . dextromethorphan-guaiFENesin  1 tablet Oral BID  . hydrocerin   Topical BID  . Influenza vac split quadrivalent PF   0.5 mL Intramuscular Tomorrow-1000  . levalbuterol  1.25 mg Nebulization BID  . levofloxacin  750 mg Oral Daily  . polyethylene glycol  17 g Oral Daily  . senna-docusate  1 tablet Oral BID    Continuous Infusions: . heparin 1,900 Units/hr (01/27/17 2053)     Time spent: 30mins I have personally reviewed and interpreted on  01/28/2017 daily labs, tele strips, imagings as discussed above under date review session and assessment and plans.  I reviewed all nursing notes, pharmacy notes, wound care notes,  vitals, pertinent old records  I have discussed plan of care as described above with RN , patient  on 01/28/2017   Florencia Reasons MD, PhD  [facility] Pager [phone]. If 7PM-7AM, please contact night-coverage at [email or web address], password [facility] 01/28/2017, 9:31 AM  LOS: 3 days

## 2017-01-29 LAB — CBC
HCT: 29.6 % — ABNORMAL LOW (ref 36.0–46.0)
Hemoglobin: 9.5 g/dL — ABNORMAL LOW (ref 12.0–15.0)
MCH: 27.9 pg (ref 26.0–34.0)
MCHC: 32.1 g/dL (ref 30.0–36.0)
MCV: 87.1 fL (ref 78.0–100.0)
Platelets: 356 10*3/uL (ref 150–400)
RBC: 3.4 MIL/uL — ABNORMAL LOW (ref 3.87–5.11)
RDW: 14.2 % (ref 11.5–15.5)
WBC: 7.9 10*3/uL (ref 4.0–10.5)

## 2017-01-29 LAB — LEGIONELLA PNEUMOPHILA SEROGP 1 UR AG: L. pneumophila Serogp 1 Ur Ag: NEGATIVE

## 2017-01-29 LAB — BASIC METABOLIC PANEL
Anion gap: 6 (ref 5–15)
BUN: 10 mg/dL (ref 6–20)
CO2: 25 mmol/L (ref 22–32)
Calcium: 9.1 mg/dL (ref 8.9–10.3)
Chloride: 103 mmol/L (ref 101–111)
Creatinine, Ser: 0.78 mg/dL (ref 0.44–1.00)
GFR calc Af Amer: >60 mL/min (ref >60)
GFR calc non Af Amer: >60 mL/min (ref >60)
Glucose, Bld: 107 mg/dL — ABNORMAL HIGH (ref 65–99)
Potassium: 4 mmol/L (ref 3.5–5.1)
Sodium: 134 mmol/L — ABNORMAL LOW (ref 135–145)

## 2017-01-29 LAB — MAGNESIUM: Magnesium: 1.9 mg/dL (ref 1.7–2.4)

## 2017-01-29 LAB — HEPARIN LEVEL (UNFRACTIONATED): Heparin Unfractionated: 0.49 IU/mL (ref 0.30–0.70)

## 2017-01-29 MED ORDER — CLONAZEPAM 0.5 MG PO TABS
0.2500 mg | ORAL_TABLET | Freq: Three times a day (TID) | ORAL | Status: DC | PRN
  Administered 2017-01-30: 0.25 mg via ORAL
  Filled 2017-01-29: qty 1

## 2017-01-29 MED ORDER — HYDROCODONE-ACETAMINOPHEN 5-325 MG PO TABS
1.0000 | ORAL_TABLET | Freq: Four times a day (QID) | ORAL | Status: DC | PRN
  Administered 2017-01-29: 1 via ORAL
  Filled 2017-01-29: qty 1

## 2017-01-29 NOTE — Progress Notes (Signed)
Pt states she feels "hot and dizzy" says she felt dizzy earlier when she was up moving around, but she's just been resting in bed. Rechecked vital signs.  BP: 130/67 Pulse: 97 Resp: 20 Oxygen Saturation: 97% Notified hospitalist via text page. Will continue to monitor at this time.

## 2017-01-29 NOTE — Progress Notes (Signed)
ANTICOAGULATION CONSULT NOTE - Follow Up Consult  Pharmacy Consult for heparin  Indication: new pulmonary embolus  Allergies  Allergen Reactions  . Augmentin [Amoxicillin-Pot Clavulanate] Hives and Itching    Did PCN reaction causing immediate rash, facial/tongue/throat swelling, SOB or lightheadedness with hypotension: no Did PCN reaction causing severe rash involving mucus membranes or skin necrosis: no Has patient had a PCN reaction that required hospitalization: in hospital Has patient had a PCN reaction occurring within the last 10 years: no If all of the above answers are "NO", then may proceed with Cephalosporin use.  Pt reports no recollection of any reactions when taking penicillin in past  . Latex Hives    Local reaction (welts). Patient denies any wheezing or other reaction with latex exposure    Patient Measurements: Height: 6' (182.9 cm) Weight: 285 lb (129.3 kg) IBW/kg (Calculated) : 73.1 Heparin Dosing Weight: 103 kg  Vital Signs: Temp: 98.9 F (37.2 C) (12/03 0511) Temp Source: Oral (12/03 0511) BP: 142/80 (12/03 0511) Pulse Rate: 92 (12/03 0511)  Labs: Recent Labs    01/26/17 1124  01/27/17 0256  01/27/17 1031 01/27/17 1831 01/28/17 0601 01/29/17 0559  HGB  --   --   --    < > 10.0*  --  9.7* 9.5*  HCT  --   --   --   --  30.1*  --  30.2* 29.6*  PLT  --   --   --   --  283  --  351 356  HEPARINUNFRC <0.10*   < > 0.77*  --  0.71* 0.57 0.57 0.49  CREATININE  --   --  0.78  --   --   --  0.84 0.78  TROPONINI 0.05*  --   --   --   --   --   --   --    < > = values in this interval not displayed.    Estimated Creatinine Clearance: 129.8 mL/min (by C-G formula based on SCr of 0.78 mg/dL).  Assessment: Patient's a 51 y.o F presented to the ED on 01/25/17 with c/o perineum rash,  urinary incontinence, and chest discomfort.  Chest CTA showed new "subsegmental right lower lobe and possibly lingula, probable pulmonary infarcts in the right middle lobe and  lingula."  LE doppler negative for DVT.  Patient's currently on heparin drip for new PE.  Today, 01/29/2017: - heparin level is therapeutic at 0.49 - cbc is relatively stable - no bleeding documented  Goal of Therapy:  Heparin level 0.3-0.7 units/ml Monitor platelets by anticoagulation protocol: Yes   Plan:  - continue heparin drip at 1900 units/hr - daily heparin level - monitor for s/s bleeding  Meria Crilly P 01/29/2017,7:59 AM

## 2017-01-29 NOTE — Progress Notes (Signed)
Pt called out saying she can't breathe. Upon assessment she appears short of breath, taking shallow, fast breaths. Placed pt on 2L oxygen. Upon auscultation she is diminished which is unchanged from her previous assessment. Vital signs read as follows: BP: 153/80 Pulse 102 Resp: 22 Oxygen saturation: 98% on 2L nasal cannula Temp: 98.2 She states she is not in any pain, just feels a little hot. Removed blanket from bed, placed cool washcloth behind her neck, turned air down. Pt states she feels better. Will continue to monitor closely at this time.

## 2017-01-29 NOTE — Progress Notes (Signed)
PROGRESS NOTE  [name] GBT:[id number] DOB: 01-31-1966 DOA: 01/25/2017 PCP: Patient, No Pcp Per  HPI/Recap of past 24 hours:  C/o left knee pain, not able to bear weight she reports Right-sided chest pain continue to improve  no cough, no sob at rest, she is on room air at rest  No fever last 24hrs  denies ab pain, no n/v   Assessment/Plan: Principal Problem:   PE (pulmonary thromboembolism) (HCC) Active Problems:   Anemia   Urinary incontinence   Hypokalemia   Lobar pneumonia (HCC)   Pressure ulcer of sacral region, stage 2   Axillary lymphadenopathy   fever (fever 101.5 on 12/1 4 am) -Blood  Culture no growth, mrsa screen negative, urine culture + multiple species. Urine strep pneumo antigen negative -she does has skin irritation, PE could also cause fever -Continue levaquin for now  PE (pulmonary thromboembolism) ([place]):   -patient presented to the ED due to concerning urinary incontinence and associated skin damage -she reports while staying in the ED , she developed  Right sided chest pain -CTA showed PE in subsegmental right lower lobe and possibly lingula.  -She does have sinus tachycardia, no hypoxia, blood pressure stable.  -she is started on heparin drip, and abx due to  a mild leukocytosis and a fever, cannot completely rule out lobar pneumonia. -echo lvef wnl, + grade I diastolic dysfunction,  No wall motion abnormalities, right ventrical unremarkable, pulmonary artery unremarkable. venos doppler negative for DVT. -continue heparin drip and levaquin ( change to oral levaquin), currently no cough, right-sided pleuritic chest pain is improving  Bilateral lower extremity edema/DOE: -Diastolic chf exacerbation? Vs lymphaedma -less edema after lasix on 12/1, will consider prn lasix  monitor bp and cr  Moisture associated skin damage  -Wound care input appreciated  Per wound care "perineal skin damage, entire buttocks and upper and medial thigh area  with dozens of open areas Ambulates with a cane, is incontinent of urine, wears a sanitary pad at home and mostly stays in bed or chair " Patient has Purewick at present so moisture will not be a problem while in hospital. Talked to patient about using female urinal that would catch the urine and keep it away from skin rather than a pad that holds it in" She is on abx  Hypokalemia/hypomagnesemia:  - Replace k/mag   Back pain, progressive weakness with urinary incontinence: Mri lumber spine  left knee pain, swollen, tender and warm to touch, not able to bear weight Mri left knee  Chronic constipation: she reports only moves her bowel once a few weeks She denies ab pain, no n/v.  FTT,  Patient reports gradual declined from 02/2016, she has to quit her job in august. progressive weakness for the last two months,  recently not able to ambulate She contribute this to chronic right knee pain and progressive bilateral lower extremity edema.   She reports does not have pmd, no insurance.  Will need to get physical therapy once stable from PE stand point  Anemia: hgb stable. 10.0 -f/u by CBC   Axillary lymphadenopathy: as shown by CTA. Need to r/o breast cancer. on ct ab: pelvic adenopathy, similar on recent prior exams. This could be reactive. Correlate with any primary malignancy history. Consider follow-up pelvic CT at 3-6 months. If any history of primary malignancy, consider PET. -will need to get mammogram as outpatient -May give referral to oncologist   Body mass index is 38.65 kg/m.     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  Consultants:  Wound care  Procedures:  none  Antibiotics:  levaquin   Objective: BP (!) 142/80 (BP Location: Right Arm)   Pulse 92   Temp 98.9  F (37.2 C) (Oral)   Resp 18   Ht 6' (1.829 m)   Wt 129.3 kg (285 lb)   LMP 01/31/2011   SpO2 96%   BMI 38.65 kg/m   Intake/Output Summary (Last 24 hours) at 01/29/2017 0838 Last data filed at 01/29/2017 4174 Gross per 24 hour  Intake 588 ml  Output 1951 ml  Net -1363 ml   Filed Weights   01/25/17 1852  Weight: 129.3 kg (285 lb)    Exam: Patient is examined daily including today on 01/29/2017, exams remain the same as of yesterday except that has changed    General:  NAD, flat affect, obese  Cardiovascular: sinus tachycardia has resolved, now RRR  Respiratory: CTABL  Abdomen: Soft/ND/NT, positive BS  Musculoskeletal: bilateral lower extremity pitting Edema is improving (chronic lymphaedma appearance), lower extremity barely able to lift against gravity, sensation intact, left knee pain, swollen, tender and warm to touch, not able to bear weight  Neuro: alert, oriented   Data Reviewed: Basic Metabolic Panel: Recent Labs  Lab 01/25/17 1814 01/26/17 0617 01/27/17 0256 01/28/17 0601 01/29/17 0559  NA 138  --  135 137 134*  K 3.4*  --  3.5 3.7 4.0  CL 106  --  105 105 103  CO2 26  --  25 26 25   GLUCOSE 110*  --  113* 120* 107*  BUN 13  --  6 9 10   CREATININE 0.89  --  0.78 0.84 0.78  CALCIUM 9.6  --  8.8* 9.2 9.1  MG  --  1.7 1.7 1.8 1.9   Liver Function Tests: Recent Labs  Lab 01/25/17 1814 01/27/17 0256  AST 24 20  ALT 19 14  ALKPHOS 57 48  BILITOT 1.1 1.0  PROT 8.2* 7.1  ALBUMIN 3.8 2.9*   No results for input(s): LIPASE, AMYLASE in the last 168 hours. No results for input(s): AMMONIA in the last 168 hours. CBC: Recent Labs  Lab 01/25/17 1814 01/27/17 1031 01/28/17 0601 01/29/17 0559  WBC 10.8* 10.6* 8.1 7.9  NEUTROABS 7.8*  --   --   --   HGB 10.0* 10.0* 9.7* 9.5*  HCT 32.0* 30.1* 30.2* 29.6*  MCV 89.1 86.7 87.0 87.1  PLT 375 283 351 356   Cardiac Enzymes:   Recent Labs  Lab 01/26/17 0000 01/26/17 0617 01/26/17 1124  TROPONINI  <0.03 0.03* 0.05*   BNP (last 3 results) Recent Labs    01/26/17 0617  BNP 29.9    ProBNP (last 3 results) No results for input(s): PROBNP in the last 8760 hours.  CBG: No results for input(s): GLUCAP in the last 168 hours.  Recent Results (from the past 240 hour(s))  Culture, blood (Routine X 2) w Reflex to ID Panel     Status: None (Preliminary result)   Collection Time: 01/25/17  6:17 PM  Result Value Ref Range Status   Specimen Description BLOOD LEFT ANTECUBITAL  Final   Special Requests   Final    BOTTLES DRAWN AEROBIC AND ANAEROBIC Blood Culture adequate volume   Culture   Final  NO GROWTH 3 DAYS Performed at [facility], [place] [address], [place], [place] [address]    Report Status PENDING  Incomplete  Urine Culture     Status: Abnormal   Collection Time: 01/25/17  6:23 PM  Result Value Ref Range Status   Specimen Description URINE, CLEAN CATCH  Final   Special Requests NONE  Final   Culture MULTIPLE SPECIES PRESENT, SUGGEST RECOLLECTION (A)  Final   Report Status 01/27/2017 FINAL  Final  Culture, blood (Routine X 2) w Reflex to ID Panel     Status: None (Preliminary result)   Collection Time: 01/26/17  5:00 AM  Result Value Ref Range Status   Specimen Description BLOOD RIGHT ANTECUBITAL  Final   Special Requests   Final    BOTTLES DRAWN AEROBIC AND ANAEROBIC Blood Culture adequate volume   Culture   Final    NO GROWTH 2 DAYS Performed at [facility], [place] [address] [place], [place] [address]    Report Status PENDING  Incomplete  MRSA PCR Screening     Status: None   Collection Time: 01/26/17  5:57 PM  Result Value Ref Range Status   MRSA by PCR NEGATIVE NEGATIVE Final    Comment:        The GeneXpert MRSA Assay (FDA approved for NASAL specimens only), is one component of a comprehensive MRSA colonization surveillance program. It is not intended to diagnose MRSA infection nor to guide or monitor treatment for MRSA infections.       Studies: No results found.  Scheduled Meds: . clonazePAM  0.25 mg Oral Once  . dextromethorphan-guaiFENesin  1 tablet Oral BID  . hydrocerin   Topical BID  . Influenza vac split quadrivalent PF  0.5 mL Intramuscular Tomorrow-1000  . levalbuterol  1.25 mg Nebulization BID  . levofloxacin  750 mg Oral Daily  . magnesium oxide  400 mg Oral Daily  . polyethylene glycol  17 g Oral Daily  . senna-docusate  1 tablet Oral BID    Continuous Infusions: . heparin 1,900 Units/hr (01/28/17 2237)     Time spent: 93mins I have personally reviewed and interpreted on  01/29/2017 daily labs, tele strips, imagings as discussed above under date review session and assessment and plans.  I reviewed all nursing notes, pharmacy notes, wound care notes,  vitals, pertinent old records  I have discussed plan of care as described above with RN , patient  on 01/29/2017   Florencia Reasons MD, PhD  [facility] Pager [phone]. If 7PM-7AM, please contact night-coverage at [email or web address], password [facility] 01/29/2017, 8:38 AM  LOS: 4 days

## 2017-01-29 NOTE — Progress Notes (Signed)
Date:  January 29, 2017 Chart reviewed for concurrent status and case management needs.  Will continue to follow patient progress.  Discharge Planning: following for needs  Expected discharge date: February 01, 2017  Rhonda Davis, BSN, RN3, CCM   [phone]  

## 2017-01-30 ENCOUNTER — Ambulatory Visit (HOSPITAL_COMMUNITY)
Admit: 2017-01-30 | Discharge: 2017-01-30 | Disposition: A | Discharge status: Home / Self Care | Payer: Self-pay | Attending: Internal Medicine | Admitting: Internal Medicine

## 2017-01-30 LAB — CULTURE, BLOOD (ROUTINE X 2)
Culture: NO GROWTH
Special Requests: ADEQUATE

## 2017-01-30 LAB — BASIC METABOLIC PANEL
Anion gap: 5 (ref 5–15)
BUN: 11 mg/dL (ref 6–20)
CO2: 25 mmol/L (ref 22–32)
Calcium: 9.3 mg/dL (ref 8.9–10.3)
Chloride: 103 mmol/L (ref 101–111)
Creatinine, Ser: 0.76 mg/dL (ref 0.44–1.00)
GFR calc Af Amer: >60 mL/min (ref >60)
GFR calc non Af Amer: >60 mL/min (ref >60)
Glucose, Bld: 126 mg/dL — ABNORMAL HIGH (ref 65–99)
Potassium: 3.9 mmol/L (ref 3.5–5.1)
Sodium: 133 mmol/L — ABNORMAL LOW (ref 135–145)

## 2017-01-30 LAB — CBC
HCT: 30.6 % — ABNORMAL LOW (ref 36.0–46.0)
Hemoglobin: 9.9 g/dL — ABNORMAL LOW (ref 12.0–15.0)
MCH: 28.4 pg (ref 26.0–34.0)
MCHC: 32.4 g/dL (ref 30.0–36.0)
MCV: 87.7 fL (ref 78.0–100.0)
Platelets: 363 10*3/uL (ref 150–400)
RBC: 3.49 MIL/uL — ABNORMAL LOW (ref 3.87–5.11)
RDW: 14.3 % (ref 11.5–15.5)
WBC: 7.4 10*3/uL (ref 4.0–10.5)

## 2017-01-30 LAB — HEPARIN LEVEL (UNFRACTIONATED): Heparin Unfractionated: 0.64 IU/mL (ref 0.30–0.70)

## 2017-01-30 LAB — MAGNESIUM: Magnesium: 1.8 mg/dL (ref 1.7–2.4)

## 2017-01-30 LAB — URIC ACID: Uric Acid, Serum: 3.3 mg/dL (ref 2.3–6.6)

## 2017-01-30 MED ORDER — GABAPENTIN 100 MG PO CAPS
100.0000 mg | ORAL_CAPSULE | Freq: Every day | ORAL | Status: DC
  Administered 2017-01-30 – 2017-02-01 (×3): 100 mg via ORAL
  Filled 2017-01-30 (×3): qty 1

## 2017-01-30 NOTE — Progress Notes (Signed)
MRI to be done at [facility] today; MRI at [facility] called and [facility] has been called for pt to arrive at [facility] at 1500

## 2017-01-30 NOTE — Progress Notes (Signed)
ANTICOAGULATION CONSULT NOTE - Follow Up Consult  Pharmacy Consult for heparin  Indication: new pulmonary embolus  Allergies  Allergen Reactions  . Augmentin [Amoxicillin-Pot Clavulanate] Hives and Itching    Did PCN reaction causing immediate rash, facial/tongue/throat swelling, SOB or lightheadedness with hypotension: no Did PCN reaction causing severe rash involving mucus membranes or skin necrosis: no Has patient had a PCN reaction that required hospitalization: in hospital Has patient had a PCN reaction occurring within the last 10 years: no If all of the above answers are "NO", then may proceed with Cephalosporin use.  Pt reports no recollection of any reactions when taking penicillin in past  . Latex Hives    Local reaction (welts). Patient denies any wheezing or other reaction with latex exposure    Patient Measurements: Height: 6' (182.9 cm) Weight: 285 lb (129.3 kg) IBW/kg (Calculated) : 73.1 Heparin Dosing Weight: 103 kg  Vital Signs: Temp: 99 F (37.2 C) (12/04 0608) Temp Source: Oral (12/04 0608) BP: 123/75 (12/04 3748) Pulse Rate: 92 (12/04 0608)  Labs: Recent Labs    01/28/17 0601 01/29/17 0559 01/30/17 0558  HGB 9.7* 9.5* 9.9*  HCT 30.2* 29.6* 30.6*  PLT 351 356 363  HEPARINUNFRC 0.57 0.49 0.64  CREATININE 0.84 0.78 0.76    Estimated Creatinine Clearance: 129.8 mL/min (by C-G formula based on SCr of 0.76 mg/dL).  Assessment: Patient's a 51 y.o F presented to the ED on 01/25/17 with c/o perineum rash,  urinary incontinence, and chest discomfort.  Chest CTA showed new "subsegmental right lower lobe and possibly lingula, probable pulmonary infarcts in the right middle lobe and lingula."  LE doppler negative for DVT.  Patient's currently on heparin drip for acute PE.  Today, 01/30/2017: - heparin level is therapeutic at 0.64 - cbc is relatively stable - no bleeding documented  Goal of Therapy:  Heparin level 0.3-0.7 units/ml Monitor platelets by  anticoagulation protocol: Yes   Plan:  - continue heparin drip at 1900 units/hr - daily heparin level - monitor for s/s bleeding  Thea Holshouser P 01/30/2017,7:40 AM

## 2017-01-30 NOTE — Progress Notes (Addendum)
PROGRESS NOTE  [name]:[id number] DOB: 1965-12-30 DOA: 01/25/2017 PCP: Patient, No Pcp Per  HPI/Recap of past 24 hours:  C/o left knee pain, not able to bear weight Reports both legs at jumping at night which is new Report chronic low back pain No bm yet, denies ab pain, no n/v she reports Right-sided chest pain continue to improve,  no cough, no sob at rest, she is on room air at rest  No fever last 24hrs     Assessment/Plan: Principal Problem:   PE (pulmonary thromboembolism) (HCC) Active Problems:   Anemia   Urinary incontinence   Hypokalemia   Lobar pneumonia (HCC)   Pressure ulcer of sacral region, stage 2   Axillary lymphadenopathy   fever (fever 101.5 on 12/1 4 am) -Blood  Culture no growth, mrsa screen negative, urine culture + multiple species. Urine strep pneumo antigen negative -she does has skin irritation, PE could also cause fever -plan for 5 days levaquin , last dose on 12/5 Incentive spirometer  PE (pulmonary thromboembolism) ([place]):   -patient presented to the ED due to concerning urinary incontinence /associated skin damage and progressive weakness -she reports while staying in the ED , she developed  Right sided chest pain -CTA showed PE in subsegmental right lower lobe and possibly lingula.  -She does have sinus tachycardia, no hypoxia, blood pressure stable on presentation.  -she is started on heparin drip, and abx due to  a mild leukocytosis and a fever, cannot completely rule out lobar pneumonia. -echo lvef wnl, + grade I diastolic dysfunction,  No wall motion abnormalities, right ventrical unremarkable, pulmonary artery unremarkable. venos doppler negative for DVT. -continue heparin drip and levaquin ( change to oral levaquin), currently no cough, right-sided pleuritic chest pain is improving -plan to transition to eliquis once mri result finalized and no surgical intervention needed.  Bilateral lower extremity  edema/DOE: -Diastolic chf exacerbation? Vs lymphaedma -venous doppler no dvt -right lower extremity edema almost resolved after lasix x1 on 12/1, left lower extremity edema improved, but reamin edematous, possible due to dependent edema due to not able to lift left leg up due to left knee pain -will consider prn lasix  monitor bp and cr  Jumping legs: restless leg vs neuropathy Start Trial of neurontin, titrate dose   Moisture associated skin damage from urinary incontinence -Wound care input appreciated  Per wound care "perineal skin damage, entire buttocks and upper and medial thigh area with dozens of open areas Ambulates with a cane, is incontinent of urine, wears a sanitary pad at home and mostly stays in bed or chair " Patient has Purewick at present so moisture will not be a problem while in hospital. Talked to patient about using female urinal that would catch the urine and keep it away from skin rather than a pad that holds it in" She is on abx  Hypokalemia/hypomagnesemia:  - Replace k/mag   Back pain, progressive weakness with urinary incontinence: Mri lumber spine Has attempted to mri done for several days, not have to get patient to [place] for mri  left knee pain, swollen, tender and warm to touch, not able to bear weight Mri left knee  Chronic constipation: she reports only moves her bowel once a few weeks She denies ab pain, no n/v.  FTT,  Patient reports gradual declined from 02/2016, she has to quit her job in august. progressive weakness for the last two months,  recently not able to ambulate She contribute this to  chronic right knee pain and progressive bilateral lower extremity edema.   She reports does not have pmd, no insurance.  Will need to get physical therapy once stable from PE stand point  Anemia: hgb stable. 10.0 -f/u by CBC   Axillary lymphadenopathy: as shown by CTA. Need to r/o breast cancer. on ct ab: pelvic adenopathy, similar on  recent prior exams. This could be reactive. Correlate with any primary malignancy history. Consider follow-up pelvic CT at 3-6 months. If any history of primary malignancy, consider PET. -will need to get mammogram as outpatient -May give referral to oncologist   Body mass index is 38.65 kg/m.     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  Consultants:  Wound care  Procedures:  none  Antibiotics:  levaquin   Objective: BP 123/75 (BP Location: Right Arm)   Pulse 92   Temp 99 F (37.2 C) (Oral)   Resp 20   Ht 6' (1.829 m)   Wt 129.3 kg (285 lb)   LMP 01/31/2011   SpO2 96%   BMI 38.65 kg/m   Intake/Output Summary (Last 24 hours) at 01/30/2017 0737 Last data filed at 01/29/2017 2327 Gross per 24 hour  Intake 120 ml  Output 900 ml  Net -780 ml   Filed Weights   01/25/17 1852  Weight: 129.3 kg (285 lb)    Exam: Patient is examined daily including today on 01/30/2017, exams remain the same as of yesterday except that has changed    General:  NAD, flat affect, obese  Cardiovascular: sinus tachycardia has resolved, now RRR  Respiratory: CTABL  Abdomen: Soft/ND/NT, positive BS  Musculoskeletal: bilateral lower extremity pitting Edema is improving , lower extremity barely able to lift against gravity, sensation intact, left knee pain, swollen, tender and warm to touch, not able to bear weight  Neuro: alert, oriented   Data Reviewed: Basic Metabolic Panel: Recent Labs  Lab 01/25/17 1814 01/26/17 0617 01/27/17 0256 01/28/17 0601 01/29/17 0559 01/30/17 0558  NA 138  --  135 137 134* 133*  K 3.4*  --  3.5 3.7 4.0 3.9  CL 106  --  105 105 103 103  CO2 26  --  25 26 25 25   GLUCOSE 110*  --  113* 120* 107* 126*  BUN 13  --  6 9 10 11   CREATININE  0.89  --  0.78 0.84 0.78 0.76  CALCIUM 9.6  --  8.8* 9.2 9.1 9.3  MG  --  1.7 1.7 1.8 1.9 1.8   Liver Function Tests: Recent Labs  Lab 01/25/17 1814 01/27/17 0256  AST 24 20  ALT 19 14  ALKPHOS 57 48  BILITOT 1.1 1.0  PROT 8.2* 7.1  ALBUMIN 3.8 2.9*   No results for input(s): LIPASE, AMYLASE in the last 168 hours. No results for input(s): AMMONIA in the last 168 hours. CBC: Recent Labs  Lab 01/25/17 1814 01/27/17 1031 01/28/17 0601 01/29/17 0559 01/30/17 0558  WBC 10.8* 10.6* 8.1 7.9 7.4  NEUTROABS 7.8*  --   --   --   --   HGB 10.0* 10.0* 9.7* 9.5* 9.9*  HCT 32.0* 30.1* 30.2* 29.6* 30.6*  MCV 89.1 86.7 87.0 87.1 87.7  PLT 375 283 351 356 363   Cardiac Enzymes:   Recent Labs  Lab 01/26/17 0000 01/26/17 0617 01/26/17 1124  TROPONINI <0.03 0.03* 0.05*   BNP (last 3 results) Recent Labs    01/26/17 0617  BNP 29.9    ProBNP (last 3 results) No results for input(s): PROBNP in the last 8760 hours.  CBG: No results for input(s): GLUCAP in the last 168 hours.  Recent Results (from the past 240 hour(s))  Culture, blood (Routine X 2) w Reflex to ID Panel     Status: None (Preliminary result)   Collection Time: 01/25/17  6:17 PM  Result Value Ref Range Status   Specimen Description BLOOD LEFT ANTECUBITAL  Final   Special Requests   Final    BOTTLES DRAWN AEROBIC AND ANAEROBIC Blood Culture adequate volume   Culture   Final    NO GROWTH 4 DAYS Performed at [facility], [address] [place], [place] [address]    Report Status PENDING  Incomplete  Urine Culture     Status: Abnormal   Collection Time: 01/25/17  6:23 PM  Result Value Ref Range Status   Specimen Description URINE, CLEAN CATCH  Final   Special Requests NONE  Final   Culture MULTIPLE SPECIES PRESENT, SUGGEST RECOLLECTION (A)  Final   Report Status 01/27/2017 FINAL  Final  Culture, blood (Routine X 2) w Reflex to ID Panel     Status: None (Preliminary result)   Collection Time: 01/26/17   5:00 AM  Result Value Ref Range Status   Specimen Description BLOOD RIGHT ANTECUBITAL  Final   Special Requests   Final    BOTTLES DRAWN AEROBIC AND ANAEROBIC Blood Culture adequate volume   Culture   Final    NO GROWTH 3 DAYS Performed at [facility], [place] [address] [place], [place] [address]    Report Status PENDING  Incomplete  MRSA PCR Screening     Status: None   Collection Time: 01/26/17  5:57 PM  Result Value Ref Range Status   MRSA by PCR NEGATIVE NEGATIVE Final    Comment:        The GeneXpert MRSA Assay (FDA approved for NASAL specimens only), is one component of a comprehensive MRSA colonization surveillance program. It is not intended to diagnose MRSA infection nor to guide or monitor treatment for MRSA infections.      Studies: No results found.  Scheduled Meds: . dextromethorphan-guaiFENesin  1 tablet Oral BID  . hydrocerin   Topical BID  . Influenza vac split quadrivalent PF  0.5 mL Intramuscular Tomorrow-1000  . levalbuterol  1.25 mg Nebulization BID  . levofloxacin  750 mg Oral Daily  . magnesium oxide  400 mg Oral Daily  . polyethylene glycol  17 g Oral Daily  . senna-docusate  1 tablet Oral BID    Continuous Infusions: . heparin 1,900 Units/hr (01/30/17 0510)     Time spent: 90mins I have personally reviewed and interpreted on  01/30/2017 daily labs, tele strips, imagings as discussed above under date review session and assessment and plans.  I reviewed all nursing notes, pharmacy notes, wound care notes,  vitals, pertinent old records  I have discussed plan of care as described above with RN , patient  on 01/30/2017   Florencia Reasons MD, PhD  [facility] Pager [phone]. If 7PM-7AM, please contact night-coverage at [email or web address], password [facility] 01/30/2017, 7:37 AM  LOS: 5 days

## 2017-01-31 LAB — BASIC METABOLIC PANEL
Anion gap: 6 (ref 5–15)
BUN: 11 mg/dL (ref 6–20)
CO2: 25 mmol/L (ref 22–32)
Calcium: 9.6 mg/dL (ref 8.9–10.3)
Chloride: 102 mmol/L (ref 101–111)
Creatinine, Ser: 0.78 mg/dL (ref 0.44–1.00)
GFR calc Af Amer: >60 mL/min (ref >60)
GFR calc non Af Amer: >60 mL/min (ref >60)
Glucose, Bld: 108 mg/dL — ABNORMAL HIGH (ref 65–99)
Potassium: 3.9 mmol/L (ref 3.5–5.1)
Sodium: 133 mmol/L — ABNORMAL LOW (ref 135–145)

## 2017-01-31 LAB — CBC
HCT: 30.4 % — ABNORMAL LOW (ref 36.0–46.0)
Hemoglobin: 9.7 g/dL — ABNORMAL LOW (ref 12.0–15.0)
MCH: 28 pg (ref 26.0–34.0)
MCHC: 31.9 g/dL (ref 30.0–36.0)
MCV: 87.6 fL (ref 78.0–100.0)
Platelets: 405 10*3/uL — ABNORMAL HIGH (ref 150–400)
RBC: 3.47 MIL/uL — ABNORMAL LOW (ref 3.87–5.11)
RDW: 14.5 % (ref 11.5–15.5)
WBC: 8.4 10*3/uL (ref 4.0–10.5)

## 2017-01-31 LAB — SEDIMENTATION RATE: Sed Rate: 93 mm/hr — ABNORMAL HIGH (ref 0–22)

## 2017-01-31 LAB — HEMOGLOBIN A1C
Hgb A1c MFr Bld: 5.6 % (ref 4.8–5.6)
Mean Plasma Glucose: 114.02 mg/dL

## 2017-01-31 LAB — C-REACTIVE PROTEIN: CRP: 4.9 mg/dL — ABNORMAL HIGH (ref <1.0)

## 2017-01-31 LAB — HEPARIN LEVEL (UNFRACTIONATED): Heparin Unfractionated: 0.46 IU/mL (ref 0.30–0.70)

## 2017-01-31 LAB — CULTURE, BLOOD (ROUTINE X 2)
Culture: NO GROWTH
Special Requests: ADEQUATE

## 2017-01-31 MED ORDER — APIXABAN 5 MG PO TABS
10.0000 mg | ORAL_TABLET | Freq: Two times a day (BID) | ORAL | Status: DC
  Administered 2017-02-01 – 2017-02-02 (×3): 10 mg via ORAL
  Filled 2017-01-31 (×3): qty 2

## 2017-01-31 MED ORDER — LEVALBUTEROL HCL 1.25 MG/0.5ML IN NEBU
1.2500 mg | INHALATION_SOLUTION | Freq: Every day | RESPIRATORY_TRACT | Status: DC
  Administered 2017-02-01: 1.25 mg via RESPIRATORY_TRACT
  Filled 2017-01-31: qty 0.5

## 2017-01-31 MED ORDER — APIXABAN 5 MG PO TABS: 5.0000 mg | ORAL_TABLET | Freq: Two times a day (BID) | ORAL | Status: DC

## 2017-01-31 NOTE — Progress Notes (Signed)
ANTICOAGULATION CONSULT NOTE - Follow Up Consult  Pharmacy Consult for heparin --> Eliquis Indication: new pulmonary embolus  Allergies  Allergen Reactions  . Augmentin [Amoxicillin-Pot Clavulanate] Hives and Itching    Did PCN reaction causing immediate rash, facial/tongue/throat swelling, SOB or lightheadedness with hypotension: no Did PCN reaction causing severe rash involving mucus membranes or skin necrosis: no Has patient had a PCN reaction that required hospitalization: in hospital Has patient had a PCN reaction occurring within the last 10 years: no If all of the above answers are "NO", then may proceed with Cephalosporin use.  Pt reports no recollection of any reactions when taking penicillin in past  . Latex Hives    Local reaction (welts). Patient denies any wheezing or other reaction with latex exposure    Patient Measurements: Height: 6' (182.9 cm) Weight: 285 lb (129.3 kg) IBW/kg (Calculated) : 73.1 Heparin Dosing Weight: 103 kg  Vital Signs: Temp: 98.3 F (36.8 C) (12/05 1508) Temp Source: Oral (12/05 1508) BP: 137/74 (12/05 1508) Pulse Rate: 93 (12/05 1508)  Labs: Recent Labs    01/29/17 0559 01/30/17 0558 01/31/17 0659  HGB 9.5* 9.9* 9.7*  HCT 29.6* 30.6* 30.4*  PLT 356 363 405*  HEPARINUNFRC 0.49 0.64 0.46  CREATININE 0.78 0.76 0.78    Estimated Creatinine Clearance: 129.8 mL/min (by C-G formula based on SCr of 0.78 mg/dL).  Assessment: Patient's a 51 y.o F presented to the ED on 01/25/17 with c/o perineum rash,  urinary incontinence, and chest discomfort.  Chest CTA showed new "subsegmental right lower lobe and possibly lingula, probable pulmonary infarcts in the right middle lobe and lingula."  LE doppler negative for DVT.  Patient's currently on heparin drip for acute PE.  Today, 01/31/2017: - heparin level remains therapeutic at 0.46 - cbc is relatively stable - no bleeding documented - MD has ordered for transition to Eliquis to begin 12/6  AM  Goal of Therapy:  Heparin level 0.3-0.7 units/ml Monitor platelets by anticoagulation protocol: Yes   Plan:  Continue heparin drip at 1900 units/hr until 10am 12/6 per MD instruction.  Keep heparin level and CBC currently ordered for 12/6 AM.  If labs remain appropriate, will transition to Eliquis in AM.  At 10am, discontinue heparin infusion and start Eliquis 10 mg BID x 7 days then transition to 5 mg BID.  Will educate prior to discharge.   Hershal Coria 01/31/2017,9:00 PM

## 2017-01-31 NOTE — Progress Notes (Signed)
ANTICOAGULATION CONSULT NOTE - Follow Up Consult  Pharmacy Consult for heparin  Indication: new pulmonary embolus  Allergies  Allergen Reactions  . Augmentin [Amoxicillin-Pot Clavulanate] Hives and Itching    Did PCN reaction causing immediate rash, facial/tongue/throat swelling, SOB or lightheadedness with hypotension: no Did PCN reaction causing severe rash involving mucus membranes or skin necrosis: no Has patient had a PCN reaction that required hospitalization: in hospital Has patient had a PCN reaction occurring within the last 10 years: no If all of the above answers are "NO", then may proceed with Cephalosporin use.  Pt reports no recollection of any reactions when taking penicillin in past  . Latex Hives    Local reaction (welts). Patient denies any wheezing or other reaction with latex exposure    Patient Measurements: Height: 6' (182.9 cm) Weight: 285 lb (129.3 kg) IBW/kg (Calculated) : 73.1 Heparin Dosing Weight: 103 kg  Vital Signs: Temp: 99.6 F (37.6 C) (12/05 0552) Temp Source: Oral (12/05 0552) BP: 123/74 (12/05 0552) Pulse Rate: 97 (12/05 0552)  Labs: Recent Labs    01/29/17 0559 01/30/17 0558 01/31/17 0659  HGB 9.5* 9.9* 9.7*  HCT 29.6* 30.6* 30.4*  PLT 356 363 405*  HEPARINUNFRC 0.49 0.64 0.46  CREATININE 0.78 0.76 0.78    Estimated Creatinine Clearance: 129.8 mL/min (by C-G formula based on SCr of 0.78 mg/dL).  Assessment: Patient's a 51 y.o F presented to the ED on 01/25/17 with c/o perineum rash,  urinary incontinence, and chest discomfort.  Chest CTA showed new "subsegmental right lower lobe and possibly lingula, probable pulmonary infarcts in the right middle lobe and lingula."  LE doppler negative for DVT.  Patient's currently on heparin drip for acute PE.  Today, 01/31/2017: - heparin level remains therapeutic at 0.46 - cbc is relatively stable - no bleeding documented  Goal of Therapy:  Heparin level 0.3-0.7 units/ml Monitor  platelets by anticoagulation protocol: Yes   Plan:  - continue heparin drip at 1900 units/hr - daily heparin level - monitor for s/s bleeding  Ermon Sagan P 01/31/2017,10:03 AM

## 2017-01-31 NOTE — Discharge Instructions (Signed)
Information on my medicine - ELIQUIS (apixaban)  Why was Eliquis prescribed for you? Eliquis was prescribed to treat blood clots that may have been found in the veins of your legs (deep vein thrombosis) or in your lungs (pulmonary embolism) and to reduce the risk of them occurring again.  What do You need to know about Eliquis ? The starting dose is 10 mg (two 5 mg tablets) taken TWICE daily for the FIRST SEVEN (7) DAYS, then on (enter date)  02/09/12  the dose is reduced to ONE 5 mg tablet taken TWICE daily.  Eliquis may be taken with or without food.   Try to take the dose about the same time in the morning and in the evening. If you have difficulty swallowing the tablet whole please discuss with your pharmacist how to take the medication safely.  Take Eliquis exactly as prescribed and DO NOT stop taking Eliquis without talking to the doctor who prescribed the medication.  Stopping may increase your risk of developing a new blood clot.  Refill your prescription before you run out.  After discharge, you should have regular check-up appointments with your healthcare provider that is prescribing your Eliquis.    What do you do if you miss a dose? If a dose of ELIQUIS is not taken at the scheduled time, take it as soon as possible on the same day and twice-daily administration should be resumed. The dose should not be doubled to make up for a missed dose.  Important Safety Information A possible side effect of Eliquis is bleeding. You should call your healthcare provider right away if you experience any of the following: ? Bleeding from an injury or your nose that does not stop. ? Unusual colored urine (red or dark brown) or unusual colored stools (red or black). ? Unusual bruising for unknown reasons. ? A serious fall or if you hit your head (even if there is no bleeding).  Some medicines may interact with Eliquis and might increase your risk of bleeding or clotting while on  Eliquis. To help avoid this, consult your healthcare provider or pharmacist prior to using any new prescription or non-prescription medications, including herbals, vitamins, non-steroidal anti-inflammatory drugs (NSAIDs) and supplements.  This website has more information on Eliquis (apixaban): http://[email or web address].com/eliquis/home

## 2017-01-31 NOTE — Progress Notes (Addendum)
PROGRESS NOTE    MIGDALIA OLEJNICZAK  DGU:[id number] DOB: 08/01/1965 DOA: 01/25/2017 PCP: Patient, No Pcp Per   Brief Narrative:  [name] is a 51 y.o. female with medical history significant of morbid obesity, asthma, fibroid, anemia, depression, anxiety, who presents with pleuritic chest pain, shortness breath, fever and urinary incontinence.  Patient states that she has been having pleuritic chest pain and shortness of breath for several days, which has been progressively getting worse. She does not have cough. She has bilateral calf pain. She has mild fever and chills. Patient states that she was treated for UTI 3 weeks ago, after that she developed urinary incontinence, she still has little burning, but no dysuria. Patient has nausea, no vomiting, diarrhea or abdominal pain. No unilateral weakness. Of note, patient has morbid obesity, limiting her mobility. Patient states that she has rashes in her sacral area, but they are actually sacral decubitus ulcer on my examination.  Assessment & Plan:   Principal Problem:   PE (pulmonary thromboembolism) (HCC) Active Problems:   Anemia   Urinary incontinence   Hypokalemia   Lobar pneumonia (HCC)   Pressure ulcer of sacral region, stage 2   Axillary lymphadenopathy   fever (fever 101.5 on 12/1 4 am) -Blood  Culture no growth, mrsa screen negative, urine culture + multiple species. Urine strep pneumo antigen negative -she does has skin irritation, PE could also cause fever -plan for 5 days levaquin , last dose on 12/5 Incentive spirometer  PE (pulmonary thromboembolism) ([place]):   -patient presented to the ED due to concerning urinary incontinence /associated skin damage and progressive weakness -she reports while staying in the ED , she developed  Right sided chest pain -CTA showed PE insubsegmental right lower lobe and possibly lingula. -She does have sinus tachycardia, no hypoxia, blood pressure stable on presentation.    -she is started on heparin drip, and abx due to  a mild leukocytosis and a fever, cannot completely rule out lobar pneumonia. -echo lvef wnl, + grade I diastolic dysfunction,  No wall motion abnormalities, right ventrical unremarkable, pulmonary artery unremarkable. venos doppler negative for DVT. -continue heparin drip and levaquin ( change to oral levaquin), currently no cough, right-sided pleuritic chest pain is improving -plan to transition to eliquis once mri result finalized and no surgical intervention needed.  Bilateral lower extremity edema/DOE: -Diastolic chf exacerbation? Vs lymphaedma -venous doppler no dvt -right lower extremity edema almost resolved after lasix x1 on 12/1, left lower extremity edema improved, but reamin edematous, possible due to dependent edema due to not able to lift left leg up due to left knee pain -will consider prn lasix  monitor bp and cr  Jumping legs: restless leg vs neuropathy Start Trial of neurontin, titrate dose   Moisture associated skin damage from urinary incontinence -Wound care input appreciated  Per wound care "perineal skin damage, entire buttocks and upper and medial thigh area with dozens of open areas Ambulates with a cane, is incontinent of urine, wears a sanitary pad at home and mostly stays in bed or chair "Patient has Purewick at present so moisture will not be a problem while in hospital. Talked to patient about using female urinal that would catch the urine and keep it away from skin rather than a pad that holds it in" She is on abx  Hypokalemia/hypomagnesemia:  - Replace k/mag prn  Back pain, progressive weakness with urinary incontinence: Mri lumber spine with DDD at L4-L5 and L5-S1 as well as diffusely decreased  T2 and T1 marrow signal "this can be caused by marrow infiltrative processes, the most common causes include anemia, smoking, obesity, or advancing age"  left knee pain, swollen, tender and warm to touch, not  able to bear weight Mri left knee with severe tricompartmental OA as well as degeneration and tearing of both medial and lateral menisci.  Also with marked synovial thickening "inflammatory arthropathy like rheumatoid in differentia" (see MRI report) Discussed with Dr. Stann Mainland.  Will try to get outpatient appt.  Will start prednisone, schedule APAP.   '[ ]'  ESR, CRP, CCP, RF May need outpatient rheum f/u as well  Chronic constipation: she reports only moves her bowel once a few weeks She denies ab pain, no n/v.  FTT,  Patient reports gradual declined from 02/2016, she has to quit her job in august. progressive weakness for the last two months,  recently not able to ambulate She contribute this to chronic right knee pain and progressive bilateral lower extremity edema.   She reports does not have pmd, no insurance - case management for PCP PT c/s  Anemia:hgb stable. 10.0 -f/u by CBC   Axillary lymphadenopathy: as shown by CTA. Need to r/o breast cancer. on ct ab: pelvic adenopathy, similar on recent prior exams. This could be reactive. Correlate with any primary malignancy history. Consider follow-up pelvic CT at 3-6 months. If any history of primary malignancy, consider PET. -will need to getmammogram as outpatient -May give referral to oncologist  Body mass index is 38.65 kg/m.  DVT prophylaxis: heparin gtt Code Status: full  Family Communication: none at bedside Disposition Plan: pending improvement   Consultants:   Ortho, phone  Wound care  Procedures: (Don't include imaging studies which can be auto populated. Include things that cannot be auto populated i.e. Echo, Carotid and venous dopplers, Foley, Bipap, HD, tubes/drains, wound vac, central lines etc)  none  Antimicrobials: (specify start and planned stop date. Auto populated tables are space occupying and do not give end dates)  levaquin    Subjective: Persistent L knee pain as well as back  pain. Knne pain present since August.  Progressively worse.   Walking with cane since august. Back pain longer than knee. No incontinence of bowel.  Notes some urinary incontinence that she thought was due to UTI.   Objective: Vitals:   01/30/17 2025 01/30/17 2153 01/31/17 0552 01/31/17 0935  BP: (!) 142/71  123/74   Pulse: 97  97   Resp: 20  15   Temp: 98.7 F (37.1 C)  99.6 F (37.6 C)   TempSrc: Oral  Oral   SpO2: 98% 98% 96% 96%  Weight:      Height:        Intake/Output Summary (Last 24 hours) at 01/31/2017 1221 Last data filed at 01/31/2017 0840 Gross per 24 hour  Intake 240 ml  Output 2100 ml  Net -1860 ml   Filed Weights   01/25/17 1852  Weight: 129.3 kg (285 lb)    Examination:  General exam: Appears calm and comfortable  Respiratory system: Clear to auscultation. Respiratory effort normal. Cardiovascular system: S1 & S2 heard, RRR. No JVD, murmurs, rubs, gallops or clicks. No pedal edema. Gastrointestinal system: Abdomen is nondistended, soft and nontender. No organomegaly or masses felt. Normal bowel sounds heard. Central nervous system: Alert and oriented. No focal neurological deficits. Extremities: LLE strength limited due to pain, unable to lift off bed, but intact dorsiflexion and plantar flexion bilaterally Skin: No rashes, lesions or ulcers Psychiatry:  Judgement and insight appear normal. Mood & affect appropriate.     Data Reviewed: I have personally reviewed following labs and imaging studies  CBC: Recent Labs  Lab 01/25/17 1814 01/27/17 1031 01/28/17 0601 01/29/17 0559 01/30/17 0558 01/31/17 0659  WBC 10.8* 10.6* 8.1 7.9 7.4 8.4  NEUTROABS 7.8*  --   --   --   --   --   HGB 10.0* 10.0* 9.7* 9.5* 9.9* 9.7*  HCT 32.0* 30.1* 30.2* 29.6* 30.6* 30.4*  MCV 89.1 86.7 87.0 87.1 87.7 87.6  PLT 375 283 351 356 363 962*   Basic Metabolic Panel: Recent Labs  Lab 01/26/17 0617 01/27/17 0256 01/28/17 0601 01/29/17 0559 01/30/17 0558  01/31/17 0659  NA  --  135 137 134* 133* 133*  K  --  3.5 3.7 4.0 3.9 3.9  CL  --  105 105 103 103 102  CO2  --  '25 26 25 25 25  ' GLUCOSE  --  113* 120* 107* 126* 108*  BUN  --  '6 9 10 11 11  ' CREATININE  --  0.78 0.84 0.78 0.76 0.78  CALCIUM  --  8.8* 9.2 9.1 9.3 9.6  MG 1.7 1.7 1.8 1.9 1.8  --    GFR: Estimated Creatinine Clearance: 129.8 mL/min (by C-G formula based on SCr of 0.78 mg/dL). Liver Function Tests: Recent Labs  Lab 01/25/17 1814 01/27/17 0256  AST 24 20  ALT 19 14  ALKPHOS 57 48  BILITOT 1.1 1.0  PROT 8.2* 7.1  ALBUMIN 3.8 2.9*   No results for input(s): LIPASE, AMYLASE in the last 168 hours. No results for input(s): AMMONIA in the last 168 hours. Coagulation Profile: Recent Labs  Lab 01/26/17 0617  INR 1.47   Cardiac Enzymes: Recent Labs  Lab 01/26/17 0000 01/26/17 0617 01/26/17 1124  TROPONINI <0.03 0.03* 0.05*   BNP (last 3 results) No results for input(s): PROBNP in the last 8760 hours. HbA1C: Recent Labs    01/31/17 0659  HGBA1C 5.6   CBG: No results for input(s): GLUCAP in the last 168 hours. Lipid Profile: No results for input(s): CHOL, HDL, LDLCALC, TRIG, CHOLHDL, LDLDIRECT in the last 72 hours. Thyroid Function Tests: No results for input(s): TSH, T4TOTAL, FREET4, T3FREE, THYROIDAB in the last 72 hours. Anemia Panel: No results for input(s): VITAMINB12, FOLATE, FERRITIN, TIBC, IRON, RETICCTPCT in the last 72 hours. Sepsis Labs: Recent Labs  Lab 01/25/17 1825  LATICACIDVEN 1.57    Recent Results (from the past 240 hour(s))  Culture, blood (Routine X 2) w Reflex to ID Panel     Status: None   Collection Time: 01/25/17  6:17 PM  Result Value Ref Range Status   Specimen Description BLOOD LEFT ANTECUBITAL  Final   Special Requests   Final    BOTTLES DRAWN AEROBIC AND ANAEROBIC Blood Culture adequate volume   Culture   Final    NO GROWTH 5 DAYS Performed at [facility], [place] [address] [place], [place] [address]     Report Status 01/30/2017 FINAL  Final  Urine Culture     Status: Abnormal   Collection Time: 01/25/17  6:23 PM  Result Value Ref Range Status   Specimen Description URINE, CLEAN CATCH  Final   Special Requests NONE  Final   Culture MULTIPLE SPECIES PRESENT, SUGGEST RECOLLECTION (A)  Final   Report Status 01/27/2017 FINAL  Final  Culture, blood (Routine X 2) w Reflex to ID Panel     Status: None (Preliminary  result)   Collection Time: 01/26/17  5:00 AM  Result Value Ref Range Status   Specimen Description BLOOD RIGHT ANTECUBITAL  Final   Special Requests   Final    BOTTLES DRAWN AEROBIC AND ANAEROBIC Blood Culture adequate volume   Culture   Final    NO GROWTH 4 DAYS Performed at [facility], [address], [place], [place] [address]    Report Status PENDING  Incomplete  MRSA PCR Screening     Status: None   Collection Time: 01/26/17  5:57 PM  Result Value Ref Range Status   MRSA by PCR NEGATIVE NEGATIVE Final    Comment:        The GeneXpert MRSA Assay (FDA approved for NASAL specimens only), is one component of a comprehensive MRSA colonization surveillance program. It is not intended to diagnose MRSA infection nor to guide or monitor treatment for MRSA infections.          Radiology Studies: Mr Lumbar Spine Wo Contrast  Result Date: 01/30/2017 CLINICAL DATA:  Chronic low back pain. EXAM: MRI LUMBAR SPINE WITHOUT CONTRAST TECHNIQUE: Multiplanar, multisequence MR imaging of the lumbar spine was performed. No intravenous contrast was administered. COMPARISON:  CT abdomen pelvis dated January 25, 2017. FINDINGS: Segmentation:  Standard. Alignment: Mild levoscoliosis, apex at L2-L3. Sagittal alignment is normal. Vertebrae: No fracture, evidence of discitis, or suspicious bone lesion. Diffusely decreased T2 and T1 marrow signal. The T1 marrow signal remains slightly increased in signal intensity with respect to muscle and the intervertebral disc. Small hemangioma is in  the L3 and L4 vertebral bodies. Conus medullaris and cauda equina: Conus extends to the T12-L1 level. Conus and cauda equina appear normal. Paraspinal and other soft tissues: Negative. Disc levels: T11-T12: Only included on the sagittal images.  Negative. T12-L1:  Negative. L1-L2:  Negative. L2-L3:  Negative. L3-L4:  Negative. L4-L5: Small left foraminal disc extrusion. No spinal canal or neuroforaminal stenosis. L5-S1: Small left foraminal disc protrusion. No spinal canal or neuroforaminal stenosis. IMPRESSION: 1. Minimal degenerative disc disease at L4-L5 and L5-S1. No significant spinal canal or neuroforaminal stenosis at any level. 2. Diffusely decreased T2 and T1 marrow signal. T1 marrow signal remains slightly hyperintense to muscle and the intervertebral discs. Although this can be caused by marrow infiltrative processes, the most common causes include anemia, smoking, obesity, or advancing age. Electronically Signed   By: Titus Dubin M.D.   On: 01/30/2017 20:32   Mr Knee Left Wo Contrast  Result Date: 01/31/2017 CLINICAL DATA:  Left knee pain for over 6 weeks. The patient is unable to bear weight. EXAM: MRI OF THE LEFT KNEE WITHOUT CONTRAST TECHNIQUE: Multiplanar, multisequence MR imaging of the knee was performed. No intravenous contrast was administered. COMPARISON:  None. FINDINGS: This examination is limited by the patient's body habitus. MENISCI Medial meniscus: Markedly degenerated and diminutive throughout. Horizontal tear at the junction of the posterior horn and body reaches the meniscal undersurface. Longitudinal tear is seen in the anterior body. Lateral meniscus: Longitudinal tear is present in the posterior horn. The body and anterior horn are severely degenerated and diminutive. The body is extruded peripherally. Horizontal tear is seen throughout the body. A parameniscal cyst measuring 1.2 cm craniocaudal by 0.5 cm transverse by 1.7 cm AP is seen along the body. LIGAMENTS Cruciates:   Intact.  There is mucoid degeneration of the ACL. Collaterals:  Intact. CARTILAGE Patellofemoral: Extensive cartilage loss is worst along the lateral patellar facet. Medial: Severe cartilage loss is present  with bone-on-bone joint space narrowing. Lateral: Severe cartilage loss is present with bone-on-bone joint space narrowing. Joint: Small joint effusion is present. Synovium is diffusely and markedly thickened. Popliteal Fossa:  No Baker's cyst. Extensor Mechanism: Visualization is limited but there is extensive edema about the vastus lateralis component of the quadriceps tendon. The tendon at may be torn 6.5 cm above the joint line. Bones: Extensive subchondral edema is present about both the medial and lateral compartments. Tricompartmental osteophytosis is seen. No fracture or worrisome lesion. Other: None. IMPRESSION: Limited examination due to the patient's size. Extensive edema about the vastus lateralis 6.5 cm above the joint line is worrisome for tear or high-grade strain. The remainder of the extensor mechanism appears intact. Severe, markedly advanced for age tricompartmental osteoarthritis. Associated degeneration and tearing of both the medial and lateral menisci is identified as described above. Parameniscal cyst formation along the body of the lateral meniscus is noted. Marked synovial thickening diffusely about the knee consistent with intense synovitis. Given symmetric medial and lateral compartment joint space narrowing, inflammatory arthropathy such as rheumatoid is within the differential with superimposed osteoarthritis. Electronically Signed   By: Inge Rise M.D.   On: 01/31/2017 09:06        Scheduled Meds: . dextromethorphan-guaiFENesin  1 tablet Oral BID  . gabapentin  100 mg Oral QHS  . hydrocerin   Topical BID  . Influenza vac split quadrivalent PF  0.5 mL Intramuscular Tomorrow-1000  . [START ON 02/01/2017] levalbuterol  1.25 mg Nebulization Daily  . magnesium oxide   400 mg Oral Daily  . polyethylene glycol  17 g Oral Daily  . senna-docusate  1 tablet Oral BID   Continuous Infusions: . heparin 1,900 Units/hr (01/31/17 0640)     LOS: 6 days    Time spent: over 38 minu    Fayrene Helper, MD [facility] Pager [phone]  If 7PM-7AM, please contact night-coverage [email or web address] Password [email or web address] 01/31/2017, 12:21 PM

## 2017-02-01 DIAGNOSIS — M1990 Unspecified osteoarthritis, unspecified site: Secondary | ICD-10-CM

## 2017-02-01 LAB — RHEUMATOID FACTOR: Rhuematoid fact SerPl-aCnc: 208.6 IU/mL — ABNORMAL HIGH (ref 0.0–13.9)

## 2017-02-01 LAB — HEPARIN LEVEL (UNFRACTIONATED): Heparin Unfractionated: 0.37 IU/mL (ref 0.30–0.70)

## 2017-02-01 LAB — BASIC METABOLIC PANEL
Anion gap: 6 (ref 5–15)
BUN: 12 mg/dL (ref 6–20)
CO2: 25 mmol/L (ref 22–32)
Calcium: 9.8 mg/dL (ref 8.9–10.3)
Chloride: 106 mmol/L (ref 101–111)
Creatinine, Ser: 0.8 mg/dL (ref 0.44–1.00)
GFR calc Af Amer: >60 mL/min (ref >60)
GFR calc non Af Amer: >60 mL/min (ref >60)
Glucose, Bld: 120 mg/dL — ABNORMAL HIGH (ref 65–99)
Potassium: 4.3 mmol/L (ref 3.5–5.1)
Sodium: 137 mmol/L (ref 135–145)

## 2017-02-01 LAB — CBC
HCT: 32.8 % — ABNORMAL LOW (ref 36.0–46.0)
Hemoglobin: 10.3 g/dL — ABNORMAL LOW (ref 12.0–15.0)
MCH: 27.8 pg (ref 26.0–34.0)
MCHC: 31.4 g/dL (ref 30.0–36.0)
MCV: 88.4 fL (ref 78.0–100.0)
Platelets: 416 10*3/uL — ABNORMAL HIGH (ref 150–400)
RBC: 3.71 MIL/uL — ABNORMAL LOW (ref 3.87–5.11)
RDW: 14.5 % (ref 11.5–15.5)
WBC: 9.8 10*3/uL (ref 4.0–10.5)

## 2017-02-01 LAB — CYCLIC CITRUL PEPTIDE ANTIBODY, IGG/IGA: CCP Antibodies IgG/IgA: 9 units (ref 0–19)

## 2017-02-01 MED ORDER — PREDNISONE 20 MG PO TABS
40.0000 mg | ORAL_TABLET | Freq: Every day | ORAL | Status: DC
  Administered 2017-02-02: 40 mg via ORAL
  Filled 2017-02-01: qty 2

## 2017-02-01 MED ORDER — METHYLPREDNISOLONE SODIUM SUCC 125 MG IJ SOLR
60.0000 mg | Freq: Once | INTRAMUSCULAR | Status: AC
  Administered 2017-02-01: 60 mg via INTRAVENOUS
  Filled 2017-02-01: qty 2

## 2017-02-01 MED ORDER — LEVALBUTEROL HCL 1.25 MG/0.5ML IN NEBU: 1.2500 mg | INHALATION_SOLUTION | Freq: Four times a day (QID) | RESPIRATORY_TRACT | Status: DC | PRN

## 2017-02-01 NOTE — Progress Notes (Signed)
Inpatient Rehabilitation  Patient was screened by Gunnar Fusi for appropriateness for an Inpatient Acute Rehab consult.  At this time do not anticipate that a 10-14 day length of stay would assist patient in returning home alone.  Anticipate that patient will need SNF for prolonged rehab as well as to manager her chronic conditions.    Carmelia Roller., CCC/SLP Admission Coordinator  [facility]  Cell [phone]

## 2017-02-01 NOTE — Progress Notes (Addendum)
PROGRESS NOTE    Pieper E Bing  MRN:[id number] DOB: 08/09/1965 DOA: 01/25/2017 PCP: Patient, No Pcp Per   Brief Narrative:  Liliahna E Seaman is Marylee Belzer 51 y.o. female with medical history significant of morbid obesity, asthma, fibroid, anemia, depression, anxiety, who presents with pleuritic chest pain, shortness breath, fever and urinary incontinence.  Patient states that she has been having pleuritic chest pain and shortness of breath for several days, which has been progressively getting worse. She does not have cough. She has bilateral calf pain. She has mild fever and chills. Patient states that she was treated for UTI 3 weeks ago, after that she developed urinary incontinence, she still has little burning, but no dysuria. Patient has nausea, no vomiting, diarrhea or abdominal pain. No unilateral weakness. Of note, patient has morbid obesity, limiting her mobility. Patient states that she has rashes in her sacral area, but they are actually sacral decubitus ulcer on my examination.  Assessment & Plan:   Principal Problem:   PE (pulmonary thromboembolism) (HCC) Active Problems:   Anemia   Urinary incontinence   Hypokalemia   Lobar pneumonia (HCC)   Pressure ulcer of sacral region, stage 2   Axillary lymphadenopathy   fever (fever 101.5 on 12/1 4 am) -Blood  Culture no growth, mrsa screen negative, urine culture + multiple species.  - Urine strep pneumo antigen negative as well as legionella -s he does has skin irritation, PE could also cause fever - s/p 5 days levaquin Incentive spirometer  PE (pulmonary thromboembolism) (HCC):   -patient presented to the ED due to concerning urinary incontinence /associated skin damage and progressive weakness -she reports while staying in the ED , she developed  Right sided chest pain -CTA showed PE insubsegmental right lower lobe and possibly lingula. -She does have sinus tachycardia, no hypoxia, blood pressure stable on presentation.    -s/p heparin gtt, now on eliquis -echo lvef wnl, + grade I diastolic dysfunction,  No wall motion abnormalities, right ventrical unremarkable, pulmonary artery unremarkable. venous doppler negative for DVT, but limited exam.  Urinary Incontinence:  This was one of primary presentation concerns, for Kylor Valverde few weeks prior to presentation.  She notes worse when waking up, at night, and with movement.  Sounds c/w stress/overflow incontinence (she denies any saddle anesthesia, numbess, tingling, weakness, or incontinence of stool). Discussed trying scheduled urination, pelvic floor exercises, avoiding liquids prior to bedtime Will need to continue to f/u as outpatient  Bilateral lower extremity edema/DOE: -Diastolic chf exacerbation? Vs lymphaedma -venous doppler no dvt (though limited study) -right lower extremity edema almost resolved after lasix x1 on 12/1, left lower extremity edema improved, but reamin edematous -will consider prn lasix  monitor bp and cr  Jumping legs: restless leg vs neuropathy Start Trial of neurontin, titrate dose  She has not continued to c/o this to me  Moisture associated skin damage from urinary incontinence -Wound care input appreciated  Per wound care "perineal skin damage, entire buttocks and upper and medial thigh area with dozens of open areas Ambulates with Callaghan Laverdure cane, is incontinent of urine, wears Daffney Greenly sanitary pad at home and mostly stays in bed or chair "Patient has Purewick at present so moisture will not be Bettylou Frew problem while in hospital. Talked to patient about using female urinal that would catch the urine and keep it away from skin rather than Alzina Golda pad that holds it in" She is on abx  Hypokalemia/hypomagnesemia:  - Replace k/mag prn  Back pain, progressive weakness with urinary   incontinence: Mri lumber spine with DDD at L4-L5 and L5-S1 as well as diffusely decreased T2 and T1 marrow signal "this can be caused by marrow infiltrative processes, the most  common causes include anemia, smoking, obesity, or advancing age" Suspect findings are due to anemia and obesity rather than infiltrative process, but continue f/u as outpatient  left knee pain, swollen, tender and warm to touch, not able to bear weight Mri left knee with severe tricompartmental OA as well as degeneration and tearing of both medial and lateral menisci.  Also with marked synovial thickening "inflammatory arthropathy like rheumatoid in differentia" (see MRI report) Discussed with Dr. Stann Mainland.  Will try to get outpatient appt.  Will start steroid, schedule APAP.   [ ] ESR, CRP, CCP, RF, ANA May need outpatient rheum f/u as well Discharge limited by her inability to ambulate, will need to work wit PT/OT, social work for plan for d/c  Chronic constipation: she reports only moves her bowel once Lorie Cleckley few weeks She denies ab pain, no n/v.  FTT,  Patient reports gradual declined from 02/2016, she has to quit her job in august. progressive weakness for the last two months,  recently not able to ambulate She contribute this to chronic right knee pain and progressive bilateral lower extremity edema.   She reports does not have pmd, no insurance - case management for PCP PT c/s  Anemia:hgb stable. 10.0 Daily CBC   Axillary lymphadenopathy: as shown by CTA. Need to r/o breast cancer. on ct ab: pelvic adenopathy, similar on recent prior exams. This could be reactive. Correlate with any primary malignancy history. Consider follow-up pelvic CT at 3-6 months. If any history of primary malignancy, consider PET. -will need to getmammogram as outpatient - will need f/u with PCP outpatient - Consider oncology f/u  Body mass index is 38.65 kg/m.  DVT prophylaxis: eliquis Code Status: full  Family Communication: none at bedside Disposition Plan: pending improvement   Consultants:   Ortho, phone  Wound care  Procedures: (Don't include imaging studies which can be auto  populated. Include things that cannot be auto populated i.e. Echo, Carotid and venous dopplers, Foley, Bipap, HD, tubes/drains, wound vac, central lines etc)  none  Antimicrobials: (specify start and planned stop date. Auto populated tables are space occupying and do not give end dates) Anti-infectives (From admission, onward)   Start     Dose/Rate Route Frequency Ordered Stop   01/27/17 1000  levofloxacin (LEVAQUIN) tablet 750 mg     750 mg Oral Daily 01/26/17 1647 01/31/17 1009   01/26/17 2200  levofloxacin (LEVAQUIN) IVPB 750 mg  Status:  Discontinued     750 mg 100 mL/hr over 90 Minutes Intravenous Every 24 hours 01/25/17 2355 01/26/17 1647   01/25/17 2215  levofloxacin (LEVAQUIN) IVPB 750 mg     750 mg 100 mL/hr over 90 Minutes Intravenous  Once 01/25/17 2210 01/26/17 0245   01/25/17 1930  vancomycin (VANCOCIN) IVPB 1000 mg/200 mL premix     1,000 mg 200 mL/hr over 60 Minutes Intravenous  Once 01/25/17 1918 01/25/17 2055       Subjective: L knee pain seems better. Concerned about incontinence.  Objective: Vitals:   01/31/17 2008 02/01/17 0512 02/01/17 0739 02/01/17 0741  BP: (!) 145/80 122/71    Pulse: 94 98    Resp: 18 20    Temp: 98.5 F (36.9 C) 99.3 F (37.4 C)    TempSrc: Oral Oral    SpO2: 98% 98% 93% 93%  Weight:      Height:        Intake/Output Summary (Last 24 hours) at 02/01/2017 1149 Last data filed at 02/01/2017 0204 Gross per 24 hour  Intake 1772 ml  Output 1800 ml  Net -28 ml   Filed Weights   01/25/17 1852  Weight: 129.3 kg (285 lb)    Examination:  General: No acute distress. Cardiovascular: Heart sounds show Carmelina Balducci regular rate, and rhythm. No gallops or rubs. No murmurs. No JVD. Lungs: Clear to auscultation bilaterally with good air movement. No rales, rhonchi or wheezes. Abdomen: Soft, nontender, nondistended with normal active bowel sounds. No masses. No hepatosplenomegaly. Neurological: Alert and oriented 3.  Cranial nerves II through  XII grossly intact. Skin: Warm and dry. No rashes or lesions. Extremities: L>R LEE, TTP to L knee Psychiatric: Mood and affect are normal. Insight and judgment are appropriate.   Data Reviewed: I have personally reviewed following labs and imaging studies  CBC: Recent Labs  Lab 01/25/17 1814  01/28/17 0601 01/29/17 0559 01/30/17 0558 01/31/17 0659 02/01/17 0610  WBC 10.8*   < > 8.1 7.9 7.4 8.4 9.8  NEUTROABS 7.8*  --   --   --   --   --   --   HGB 10.0*   < > 9.7* 9.5* 9.9* 9.7* 10.3*  HCT 32.0*   < > 30.2* 29.6* 30.6* 30.4* 32.8*  MCV 89.1   < > 87.0 87.1 87.7 87.6 88.4  PLT 375   < > 351 356 363 405* 416*   < > = values in this interval not displayed.   Basic Metabolic Panel: Recent Labs  Lab 01/26/17 0617 01/27/17 0256 01/28/17 0601 01/29/17 0559 01/30/17 0558 01/31/17 0659 02/01/17 0610  NA  --  135 137 134* 133* 133* 137  K  --  3.5 3.7 4.0 3.9 3.9 4.3  CL  --  105 105 103 103 102 106  CO2  --  _0 GLUCOSE  --  113* 120* 107* 126* 108* 120*  BUN  --  _1 CREATININE  --  0.78 0.84 0.78 0.76 0.78 0.80  CALCIUM  --  8.8* 9.2 9.1 9.3 9.6 9.8  MG 1.7 1.7 1.8 1.9 1.8  --   --    GFR: Estimated Creatinine Clearance: 129.8 mL/min (by C-G formula based on SCr of 0.8 mg/dL). Liver Function Tests: Recent Labs  Lab 01/25/17 1814 01/27/17 0256  AST 24 20  ALT 19 14  ALKPHOS 57 48  BILITOT 1.1 1.0  PROT 8.2* 7.1  ALBUMIN 3.8 2.9*   No results for input(s): LIPASE, AMYLASE in the last 168 hours. No results for input(s): AMMONIA in the last 168 hours. Coagulation Profile: Recent Labs  Lab 01/26/17 0617  INR 1.47   Cardiac Enzymes: Recent Labs  Lab 01/26/17 0000 01/26/17 0617 01/26/17 1124  TROPONINI <0.03 0.03* 0.05*   BNP (last 3 results) No results for input(s): PROBNP in the last 8760 hours. HbA1C: Recent Labs    01/31/17 0659  HGBA1C 5.6   CBG: No results for input(s): GLUCAP in the last 168 hours. Lipid  Profile: No results for input(s): CHOL, HDL, LDLCALC, TRIG, CHOLHDL, LDLDIRECT in the last 72 hours. Thyroid Function Tests: No results for input(s): TSH, T4TOTAL, FREET4, T3FREE, THYROIDAB in the last 72 hours. Anemia Panel: No results for input(s): VITAMINB12, FOLATE, FERRITIN, TIBC, IRON, RETICCTPCT in the last 72 hours. Sepsis Labs: Recent Labs  Lab 01/25/17 1825  LATICACIDVEN 1.57    Recent Results (from the past 240 hour(s))  Culture, blood (Routine X 2) w Reflex to ID Panel     Status: None   Collection Time: 01/25/17  6:17 PM  Result Value Ref Range Status   Specimen Description BLOOD LEFT ANTECUBITAL  Final   Special Requests   Final    BOTTLES DRAWN AEROBIC AND ANAEROBIC Blood Culture adequate volume   Culture   Final    NO GROWTH 5 DAYS Performed at [facility], [place] [address] [place], [place] [address]    Report Status 01/30/2017 FINAL  Final  Urine Culture     Status: Abnormal   Collection Time: 01/25/17  6:23 PM  Result Value Ref Range Status   Specimen Description URINE, CLEAN CATCH  Final   Special Requests NONE  Final   Culture MULTIPLE SPECIES PRESENT, SUGGEST RECOLLECTION (Shields Pautz)  Final   Report Status 01/27/2017 FINAL  Final  Culture, blood (Routine X 2) w Reflex to ID Panel     Status: None   Collection Time: 01/26/17  5:00 AM  Result Value Ref Range Status   Specimen Description BLOOD RIGHT ANTECUBITAL  Final   Special Requests   Final    BOTTLES DRAWN AEROBIC AND ANAEROBIC Blood Culture adequate volume   Culture   Final    NO GROWTH 5 DAYS Performed at [facility], [place] [address] [place], [place] [address]    Report Status 01/31/2017 FINAL  Final  MRSA PCR Screening     Status: None   Collection Time: 01/26/17  5:57 PM  Result Value Ref Range Status   MRSA by PCR NEGATIVE NEGATIVE Final    Comment:        The GeneXpert MRSA Assay (FDA approved for NASAL specimens only), is one component of Kazuki Ingle comprehensive MRSA  colonization surveillance program. It is not intended to diagnose MRSA infection nor to guide or monitor treatment for MRSA infections.          Radiology Studies: Mr Lumbar Spine Wo Contrast  Result Date: 01/30/2017 CLINICAL DATA:  Chronic low back pain. EXAM: MRI LUMBAR SPINE WITHOUT CONTRAST TECHNIQUE: Multiplanar, multisequence MR imaging of the lumbar spine was performed. No intravenous contrast was administered. COMPARISON:  CT abdomen pelvis dated January 25, 2017. FINDINGS: Segmentation:  Standard. Alignment: Mild levoscoliosis, apex at L2-L3. Sagittal alignment is normal. Vertebrae: No fracture, evidence of discitis, or suspicious bone lesion. Diffusely decreased T2 and T1 marrow signal. The T1 marrow signal remains slightly increased in signal intensity with respect to muscle and the intervertebral disc. Small hemangioma is in the L3 and L4 vertebral bodies. Conus medullaris and cauda equina: Conus extends to the T12-L1 level. Conus and cauda equina appear normal. Paraspinal and other soft tissues: Negative. Disc levels: T11-T12: Only included on the sagittal images.  Negative. T12-L1:  Negative. L1-L2:  Negative. L2-L3:  Negative. L3-L4:  Negative. L4-L5: Small left foraminal disc extrusion. No spinal canal or neuroforaminal stenosis. L5-S1: Small left foraminal disc protrusion. No spinal canal or neuroforaminal stenosis. IMPRESSION: 1. Minimal degenerative disc disease at L4-L5 and L5-S1. No significant spinal canal or neuroforaminal stenosis at any level. 2. Diffusely decreased T2 and T1 marrow signal. T1 marrow signal remains slightly hyperintense to muscle and the intervertebral discs. Although this can be caused by marrow infiltrative processes, the most common causes include anemia, smoking, obesity, or advancing age. Electronically Signed   By: Orville Govern.D.  On: 01/30/2017 20:32   Mr Knee Left Wo Contrast  Result Date: 01/31/2017 CLINICAL DATA:  Left knee pain for over  6 weeks. The patient is unable to bear weight. EXAM: MRI OF THE LEFT KNEE WITHOUT CONTRAST TECHNIQUE: Multiplanar, multisequence MR imaging of the knee was performed. No intravenous contrast was administered. COMPARISON:  None. FINDINGS: This examination is limited by the patient's body habitus. MENISCI Medial meniscus: Markedly degenerated and diminutive throughout. Horizontal tear at the junction of the posterior horn and body reaches the meniscal undersurface. Longitudinal tear is seen in the anterior body. Lateral meniscus: Longitudinal tear is present in the posterior horn. The body and anterior horn are severely degenerated and diminutive. The body is extruded peripherally. Horizontal tear is seen throughout the body. Kristi Norment parameniscal cyst measuring 1.2 cm craniocaudal by 0.5 cm transverse by 1.7 cm AP is seen along the body. LIGAMENTS Cruciates:  Intact.  There is mucoid degeneration of the ACL. Collaterals:  Intact. CARTILAGE Patellofemoral: Extensive cartilage loss is worst along the lateral patellar facet. Medial: Severe cartilage loss is present with bone-on-bone joint space narrowing. Lateral: Severe cartilage loss is present with bone-on-bone joint space narrowing. Joint: Small joint effusion is present. Synovium is diffusely and markedly thickened. Popliteal Fossa:  No Baker's cyst. Extensor Mechanism: Visualization is limited but there is extensive edema about the vastus lateralis component of the quadriceps tendon. The tendon at may be torn 6.5 cm above the joint line. Bones: Extensive subchondral edema is present about both the medial and lateral compartments. Tricompartmental osteophytosis is seen. No fracture or worrisome lesion. Other: None. IMPRESSION: Limited examination due to the patient's size. Extensive edema about the vastus lateralis 6.5 cm above the joint line is worrisome for tear or high-grade strain. The remainder of the extensor mechanism appears intact. Severe, markedly advanced for  age tricompartmental osteoarthritis. Associated degeneration and tearing of both the medial and lateral menisci is identified as described above. Parameniscal cyst formation along the body of the lateral meniscus is noted. Marked synovial thickening diffusely about the knee consistent with intense synovitis. Given symmetric medial and lateral compartment joint space narrowing, inflammatory arthropathy such as rheumatoid is within the differential with superimposed osteoarthritis. Electronically Signed   By: Inge Rise M.D.   On: 01/31/2017 09:06        Scheduled Meds: . apixaban  10 mg Oral BID   Followed by  . [START ON 02/08/2017] apixaban  5 mg Oral BID  . dextromethorphan-guaiFENesin  1 tablet Oral BID  . gabapentin  100 mg Oral QHS  . hydrocerin   Topical BID  . Influenza vac split quadrivalent PF  0.5 mL Intramuscular Tomorrow-1000  . magnesium oxide  400 mg Oral Daily  . polyethylene glycol  17 g Oral Daily  . [START ON 02/02/2017] predniSONE  40 mg Oral Q breakfast  . senna-docusate  1 tablet Oral BID   Continuous Infusions:    LOS: 7 days    Time spent: over 46 minu    Fayrene Helper, MD [facility] Pager [phone]  If 7PM-7AM, please contact night-coverage [email or web address] Password [email or web address] 02/01/2017, 11:49 AM

## 2017-02-01 NOTE — Evaluation (Signed)
Physical Therapy Evaluation Patient Details Name: [name] MRN: [id number] DOB: [birth year] Today's Date: 02/01/2017   History of Present Illness  [name] is a 51 y.o. female with medical history significant of morbid obesity, asthma, fibroid, anemia, depression, anxiety, who presents 01/25/17 with pleuritic chest pain, shortness breath, fever and urinary incontinence left knee pain and inability to ambulate.  Positive for PE.  MRI shows medial and lateral tear of meneisci on left knee.   Clinical Impression  The patient requires 2 assist for safely standing. Reports pain of the left kne and noted decreased weight through left leg. Pt admitted with above diagnosis. Pt currently with functional limitations due to the deficits listed below (see PT Problem List).  Pt will benefit from skilled PT to increase their independence and safety with mobility to allow discharge to the venue listed below.       Follow Up Recommendations SNF;CIR    Equipment Recommendations  Rolling walker with 5" wheels    Recommendations for Other Services OT consult     Precautions / Restrictions Precautions Precautions: Fall Restrictions RLE Weight Bearing: Non weight bearing LLE Weight Bearing: Non weight bearing      Mobility  Bed Mobility Overal bed mobility: Needs Assistance Bed Mobility: Supine to Sit;Sit to Supine     Supine to sit: Max assist;+2 for physical assistance;+2 for safety/equipment;HOB elevated Sit to supine: Max assist;+2 for physical assistance;+2 for safety/equipment   General bed mobility comments: assist with both legs and trunk to sitting and back to supine.  Transfers Overall transfer level: Needs assistance Equipment used: Rolling walker (2 wheeled) Transfers: Sit to/from Stand Sit to Stand: Max assist;+2 physical assistance;+2 safety/equipment;From elevated surface         General transfer comment: assisted to rise and stabilize the left  knee for  standing art RW x 2 trilas. Did take a small step back with left leg,  Ambulation/Gait                Stairs            Wheelchair Mobility    Modified Rankin (Stroke Patients Only)       Balance                                             Pertinent Vitals/Pain Pain Assessment: 0-10 Pain Score: 6  Pain Location: left knee Pain Descriptors / Indicators: Discomfort;Grimacing;Heaviness Pain Intervention(s): Repositioned;Monitored during session    Home Living Family/patient expects to be discharged to:: Private residence Living Arrangements: Alone Available Help at Discharge: Family;Available PRN/intermittently Type of Home: House Home Access: Stairs to enter   Entrance Stairs-Number of Steps: 3-5 Home Layout: One level Home Equipment: Cane - single point;Crutches Additional Comments: had been staying with mother, took 30 minutes to come down multiple steps prior to admission..    Prior Function Level of Independence: Needs assistance   Gait / Transfers Assistance Needed: used cane, minimal tolerance to ambulation           Hand Dominance        Extremity/Trunk Assessment   Upper Extremity Assessment Upper Extremity Assessment: Defer to OT evaluation    Lower Extremity Assessment Lower Extremity Assessment: LLE deficits/detail;RLE deficits/detail LLE Deficits / Details: left knee flexion tolerated at about 80* in sitting. did bear weight on the  leg for a small step.  Cervical / Trunk Assessment Cervical / Trunk Assessment: Normal  Communication   Communication: No difficulties  Cognition Arousal/Alertness: Awake/alert Behavior During Therapy: Flat affect Overall Cognitive Status: Within Functional Limits for tasks assessed                                        General Comments      Exercises General Exercises - Upper Extremity Shoulder Flexion: Strengthening;Both;5 reps;Theraband Elbow Flexion:  AROM;Strengthening;Both;5 reps;Theraband Elbow Extension: AROM;Both;Theraband General Exercises - Lower Extremity Heel Slides: AAROM;Left;10 reps Straight Leg Raises: AAROM;Left;10 reps   Assessment/Plan    PT Assessment Patient needs continued PT services  PT Problem List Decreased strength;Decreased range of motion;Decreased activity tolerance;Decreased balance;Decreased mobility;Decreased knowledge of precautions;Decreased safety awareness;Decreased knowledge of use of DME       PT Treatment Interventions DME instruction;Gait training;Functional mobility training;Stair training;Therapeutic activities;Therapeutic exercise;Patient/family education    PT Goals (Current goals can be found in the Care Plan section)  Acute Rehab PT Goals Patient Stated Goal: to wlak PT Goal Formulation: With patient Time For Goal Achievement: 02/15/17 Potential to Achieve Goals: Fair    Frequency Min 3X/week   Barriers to discharge Decreased caregiver support;Inaccessible home environment      Co-evaluation               AM-PAC PT "6 Clicks" Daily Activity  Outcome Measure Difficulty turning over in bed (including adjusting bedclothes, sheets and blankets)?: Unable Difficulty moving from lying on back to sitting on the side of the bed? : Unable Difficulty sitting down on and standing up from a chair with arms (e.g., wheelchair, bedside commode, etc,.)?: Unable Help needed moving to and from a bed to chair (including a wheelchair)?: Total Help needed walking in hospital room?: Total Help needed climbing 3-5 steps with a railing? : Total 6 Click Score: 6    End of Session Equipment Utilized During Treatment: Gait belt Activity Tolerance: Patient limited by fatigue Patient left: in bed;with call bell/phone within reach Nurse Communication: Mobility status PT Visit Diagnosis: Difficulty in walking, not elsewhere classified (R26.2);Pain Pain - Right/Left: Left Pain - part of body: Knee     Time: 1022-1109 PT Time Calculation (min) (ACUTE ONLY): 47 min   Charges:   PT Evaluation $PT Eval Moderate Complexity: 1 Mod PT Treatments $Therapeutic Exercise: 8-22 mins $Therapeutic Activity: 8-22 mins   PT G CodesTresa Endo PT [phone]   Claretha Cooper 02/01/2017, 12:08 PM

## 2017-02-02 ENCOUNTER — Inpatient Hospital Stay (HOSPITAL_COMMUNITY): Payer: Self-pay

## 2017-02-02 DIAGNOSIS — I2699 Other pulmonary embolism without acute cor pulmonale: Principal | ICD-10-CM

## 2017-02-02 LAB — BASIC METABOLIC PANEL
Anion gap: 6 (ref 5–15)
BUN: 16 mg/dL (ref 6–20)
CO2: 25 mmol/L (ref 22–32)
Calcium: 9.6 mg/dL (ref 8.9–10.3)
Chloride: 104 mmol/L (ref 101–111)
Creatinine, Ser: 0.73 mg/dL (ref 0.44–1.00)
GFR calc Af Amer: >60 mL/min (ref >60)
GFR calc non Af Amer: >60 mL/min (ref >60)
Glucose, Bld: 149 mg/dL — ABNORMAL HIGH (ref 65–99)
Potassium: 4.2 mmol/L (ref 3.5–5.1)
Sodium: 135 mmol/L (ref 135–145)

## 2017-02-02 LAB — CBC
HCT: 30.7 % — ABNORMAL LOW (ref 36.0–46.0)
Hemoglobin: 9.7 g/dL — ABNORMAL LOW (ref 12.0–15.0)
MCH: 27.6 pg (ref 26.0–34.0)
MCHC: 31.6 g/dL (ref 30.0–36.0)
MCV: 87.2 fL (ref 78.0–100.0)
Platelets: 419 10*3/uL — ABNORMAL HIGH (ref 150–400)
RBC: 3.52 MIL/uL — ABNORMAL LOW (ref 3.87–5.11)
RDW: 14.5 % (ref 11.5–15.5)
WBC: 14.9 10*3/uL — ABNORMAL HIGH (ref 4.0–10.5)

## 2017-02-02 LAB — ANA W/REFLEX IF POSITIVE: Anti Nuclear Antibody(ANA): POSITIVE — AB

## 2017-02-02 LAB — ENA+DNA/DS+ANTICH+CENTRO+JO...
Anti JO-1: <0.2 AI (ref 0.0–0.9)
Centromere Ab Screen: 3.3 AI — ABNORMAL HIGH (ref 0.0–0.9)
Chromatin Ab SerPl-aCnc: 3.1 AI — ABNORMAL HIGH (ref 0.0–0.9)
ENA SM Ab Ser-aCnc: <0.2 AI (ref 0.0–0.9)
Ribonucleic Protein: 7.9 AI — ABNORMAL HIGH (ref 0.0–0.9)
SSA (Ro) (ENA) Antibody, IgG: <0.2 AI (ref 0.0–0.9)
SSB (La) (ENA) Antibody, IgG: <0.2 AI (ref 0.0–0.9)
Scleroderma (Scl-70) (ENA) Antibody, IgG: <0.2 AI (ref 0.0–0.9)
ds DNA Ab: 7 IU/mL (ref 0–9)

## 2017-02-02 MED ORDER — MAGNESIUM OXIDE 400 (241.3 MG) MG PO TABS: 400.0000 mg | ORAL_TABLET | Freq: Every day | ORAL | 0 refills | Status: DC

## 2017-02-02 MED ORDER — GABAPENTIN 100 MG PO CAPS: 100.0000 mg | ORAL_CAPSULE | Freq: Every day | ORAL | 0 refills | Status: DC

## 2017-02-02 MED ORDER — POLYETHYLENE GLYCOL 3350 17 G PO PACK: 17.0000 g | PACK | Freq: Every day | ORAL | 0 refills | Status: DC

## 2017-02-02 MED ORDER — APIXABAN 5 MG PO TABS
5.0000 mg | ORAL_TABLET | Freq: Two times a day (BID) | ORAL | 3 refills | Status: DC
Start: 2017-02-08 — End: 2017-05-22

## 2017-02-02 MED ORDER — PREDNISONE 10 MG PO TABS: ORAL_TABLET | ORAL | 0 refills | Status: DC

## 2017-02-02 MED ORDER — APIXABAN 5 MG PO TABS: 10.0000 mg | ORAL_TABLET | Freq: Two times a day (BID) | ORAL | 0 refills | Status: DC

## 2017-02-02 MED ORDER — ONDANSETRON HCL 4 MG PO TABS: 4.0000 mg | ORAL_TABLET | Freq: Three times a day (TID) | ORAL | Status: DC | PRN

## 2017-02-02 MED ORDER — FUROSEMIDE 20 MG PO TABS: 20.0000 mg | ORAL_TABLET | ORAL | 0 refills | Status: DC | PRN

## 2017-02-02 MED ORDER — SENNOSIDES-DOCUSATE SODIUM 8.6-50 MG PO TABS
1.0000 | ORAL_TABLET | Freq: Every day | ORAL | 0 refills | Status: DC
Start: 2017-02-02 — End: 2017-03-26

## 2017-02-02 NOTE — Discharge Summary (Signed)
Discharge Summary  [name] IDP:[id number] DOB: Dec 12, 1965  PCP: Patient, No Pcp Per  Admit date: 01/25/2017 Discharge date: 02/02/2017  Time spent: >80mins, more than 50% of time spent on care coordination.  Patient has no insurance, can not be discharged to SNF, case manager to arrange charity home health care.   Recommendations for Outpatient Follow-up:  1. F/u with PMD within a week  for hospital discharge follow up, repeat cbc/bmp at follow up, pmd to arrange mammogram and repeat pelvic CT scan in 3-6 months. pmd to refer patient to rheumatology due to elevated RF and possible inflammatory arthhropathy 2.   F/u with orthopedic surgery Dr Stann Mainland for left knee pain, mri  Left knee "Severe, markedly advanced for age tricompartmental osteoarthritis. Associated degeneration and tearing of both the medial and lateral menisci is identified as described above. Marked synovial thickening diffusely about the knee consistent with intense synovitis. Given symmetric medial and lateral compartment joint space narrowing, inflammatory arthropathy such as rheumatoid is within the differential with superimposed osteoarthritis."   Discharge Diagnoses:  Active Hospital Problems   Diagnosis Date Noted  . PE (pulmonary thromboembolism) ([place]) 01/26/2017  . Arthritis   . Axillary lymphadenopathy 01/26/2017  . Urinary incontinence 01/25/2017  . Hypokalemia 01/25/2017  . Lobar pneumonia ([place]) 01/25/2017  . Pressure ulcer of sacral region, stage 2 01/25/2017  . Anemia 02/22/2011    Resolved Hospital Problems  No resolved problems to display.    Discharge Condition: stable  Diet recommendation: regular diet  Filed Weights   01/25/17 1852  Weight: 129.3 kg (285 lb)    History of present illness:  PCP: Patient, No Pcp Per   Patient coming from:  The patient is coming from home.  At baseline, pt is independent for most of ADL.   Chief Complaint: Pleuritic chest pain, shortness of  breath, fever, urinary incontinence  HPI: [name] is a 51 y.o. female with medical history significant of morbid obesity, asthma, fibroid, anemia, depression, anxiety, who presents with pleuritic chest pain, shortness breath, fever and urinary incontinence.  Patient states that she has been having pleuritic chest pain and shortness of breath for several days, which has been progressively getting worse. She does not have cough. She has bilateral calf pain. She has mild fever and chills. Patient states that she was treated for UTI 3 weeks ago, after that she developed urinary incontinence, she still has little burning, but no dysuria. Patient has nausea, no vomiting, diarrhea or abdominal pain. No unilateral weakness. Of note, patient has morbid obesity, limiting her mobility. Patient states that she has rashes in her sacral area, but they are actually sacral decubitus ulcer on my examination.  ED Course: pt was found to have positive d-dimer 8.43, WBC 10.8, potassium 3.4, creatinine normal, chest x-ray showed patchy right lower base opacity. CT of abdomen/pelvis is negative for intra-abdominal issue, but showed right lower base opacity in lung. CTA was ordered. I was called by the radiologist and told me that patient has subsegmental PE and left axillary lymphadenopathy, pending formal report of CTA. Patient is admitted to telemetry bed as inpatient.    Hospital Course:  Principal Problem:   PE (pulmonary thromboembolism) (HCC) Active Problems:   Anemia   Urinary incontinence   Hypokalemia   Lobar pneumonia (HCC)   Pressure ulcer of sacral region, stage 2   Axillary lymphadenopathy   Arthritis   fever (fever 101.5 on 12/1 4 am) -Blood Culture no growth, mrsa screen negative, urine culture +  multiple species.  - Urine strep pneumo antigen negative as well as legionella -s he does has skin irritation, PE could also cause fever - s/p 5 days levaquin Incentive spirometer -fever  resolved  PE (pulmonary thromboembolism) ([place]):  -patient presented to the ED due to concerning urinary incontinence/associated skin damageand progressive weakness -she reports while staying in the ED , she developed Right sided chest pain -CTA showed PE insubsegmental right lower lobe and possibly lingula. -She does have sinus tachycardia, no hypoxia, blood pressure stableon presentation.  -s/p heparin gtt, now on eliquis -echo lvef wnl, + grade I diastolic dysfunction, No wall motion abnormalities, right ventrical unremarkable, pulmonary artery unremarkable. venous doppler negative for DVT, but limited exam. -she is discharged on eliquis, she will need to be on for 6-9 months, longer if she remains not able to ambulate.  Urinary Incontinence:  This was one of primary presentation concerns, for a few weeks prior to presentation.  She notes worse when waking up, at night, and with movement.  Sounds c/w stress/overflow incontinence (she denies any saddle anesthesia, numbess, tingling, weakness, or incontinence of stool). Discussed trying scheduled urination, pelvic floor exercises, avoiding liquids prior to bedtime Will need to continue to f/u as outpatient  Bilateral lower extremity edema/DOE: -Diastolic chf exacerbation? Vs lymphaedma -venous doppler no dvt (though limited study) -right lower extremityedemaalmost resolvedafter lasixx1on 12/1,left lower extremity edema improved, but reamin edematous -she is discharged on oral prn lasix for edema   Jumping legs:restless leg vs neuropathy Start Trial of neurontin, titrate dose Resolved, continue night time neurontin.  Moisture associated skin damagefrom urinary incontinence -Wound care input appreciated  Per wound care "perineal skin damage, entire buttocks and upper and medial thigh area with dozens of open areas Ambulates with a cane, is incontinent of urine, wears a sanitary pad at home and mostly stays in bed or  chair "Patient has Purewick at present so moisture will not be a problem while in hospital. Talked to patient about using female urinal that would catch the urine and keep it away from skin rather than a pad that holds it in" She received 5 days of abx Wound is healing, no sign of infection.  Hypokalemia/hypomagnesemia:  - Replaced  Back pain, progressive weakness with urinary incontinence: - Mri lumber [name] with DDD at L4-L5 and L5-S1 as well as diffusely decreased T2 and T1 marrow signal "this can be caused by marrow infiltrative processes, the most common causes include anemia, smoking, obesity, or advancing age" Suspect findings are due to anemia and obesity rather than infiltrative process, but continue f/u as outpatient _PT eval  left knee pain, swollen, tender and warm to touch, not able to bear weight -Mri left knee with severe tricompartmental OA as well as degeneration and tearing of both medial and lateral menisci.  Also with marked synovial thickening "inflammatory arthropathy like rheumatoid in differentia" (see MRI report) - my hospitalist colleague Dr Florene Glen Discussed with rothopedics Dr. Stann Mainland who recommended steroids and  outpatient appt.    - ccp wnl, ANA pending, RF/ERS/CRP elevated  -pmd to refer outpatient rheum f/u as well  Discharge limited by her inability to ambulate, will need to work wit PT/OT, social work for plan for d/c  Chronic constipation:she reports only moves her bowel once a few weeks She denies ab pain, no n/v. Stool softener   Normocytic Anemia:hgb stable. 10.0  FTT,  -Patient reports gradual declined from 02/2016, she has to quit her job in August. progressive weakness for the last  two months, recently not able to ambulate prior to coming to the hospital. She contribute this to chronic right knee pain and progressive bilateral lower extremity edema.  -She reports does not have pmd, no insurance - case management for PCP _ PT recommend  SNF/CIR, however she does not have insurance, case manager to arrange charity home health.   Axillary lymphadenopathy: as shown by CTA. Need to r/o breast cancer. on ct BZ:JIRCVE adenopathy, similar on recent prior exams. This could be reactive. Correlate with any primary malignancy history. Consider follow-up pelvic CT at 3-6 months. If any history of primary malignancy, consider PET. -will need to getmammogram as outpatient - will need f/u with PCP outpatient - Consider oncology f/u  Body mass index is 38.65 kg/m.    DVT prophylaxis: eliquis Code Status: full  Family Communication: none at bedside Disposition Plan: recommend d/c to snf, however patient dose not have insurance, case manager consulted to arrange charity home health care   Consultants:   Ortho, phone  Wound care     Procedures:  *  Consultations:  *  Discharge Exam: BP 125/69 (BP Location: Right Wrist)   Pulse 89   Temp 98.3 F (36.8 C) (Oral)   Resp 18   Ht 6' (1.829 m)   Wt 129.3 kg (285 lb)   LMP 01/31/2011   SpO2 95%   BMI 38.65 kg/m   General: * Cardiovascular: * Respiratory: *  Discharge Instructions You were cared for by a hospitalist during your hospital stay. If you have any questions about your discharge medications or the care you received while you were in the hospital after you are discharged, you can call the unit and asked to speak with the hospitalist on call if the hospitalist that took care of you is not available. Once you are discharged, your primary care physician will handle any further medical issues. Please note that NO REFILLS for any discharge medications will be authorized once you are discharged, as it is imperative that you return to your primary care physician (or establish a relationship with a primary care physician if you do not have one) for your aftercare needs so that they can reassess your need for medications and monitor your lab  values.  Discharge Instructions    Diet - low sodium heart healthy   Complete by:  As directed    Face-to-face encounter (required for Medicare/Medicaid patients)   Complete by:  As directed    I Florencia Reasons certify that this patient is under my care and that I, or a nurse practitioner or physician's assistant working with me, had a face-to-face encounter that meets the physician face-to-face encounter requirements with this patient on 02/02/2017. The encounter with the patient was in whole, or in part for the following medical condition(s) which is the primary reason for home health care (List medical condition): FTT   The encounter with the patient was in whole, or in part, for the following medical condition, which is the primary reason for home health care:  FTT   I certify that, based on my findings, the following services are medically necessary home health services:   Nursing Physical therapy     Reason for Medically Necessary Home Health Services:  Skilled Nursing- Change/Decline in Patient Status   My clinical findings support the need for the above services:  Pain interferes with ambulation/mobility   Further, I certify that my clinical findings support that this patient is homebound due to:  Ambulates short distances  less than 300 feet   Home Health   Complete by:  As directed    To provide the following care/treatments:   PT RN Home Health Aide     Increase activity slowly   Complete by:  As directed    Walker rolling   Complete by:  As directed      Allergies as of 02/02/2017      Reactions   Augmentin [amoxicillin-pot Clavulanate] Hives, Itching   Did PCN reaction causing immediate rash, facial/tongue/throat swelling, SOB or lightheadedness with hypotension: no Did PCN reaction causing severe rash involving mucus membranes or skin necrosis: no Has patient had a PCN reaction that required hospitalization: in hospital Has patient had a PCN reaction occurring within the last 10  years: no If all of the above answers are "NO", then may proceed with Cephalosporin use. Pt reports no recollection of any reactions when taking penicillin in past   Latex Hives   Local reaction (welts). Patient denies any wheezing or other reaction with latex exposure      Medication List    STOP taking these medications   ciprofloxacin 500 MG tablet Commonly known as:  CIPRO   doxycycline 100 MG capsule Commonly known as:  VIBRAMYCIN   HYDROcodone-acetaminophen 5-325 MG tablet Commonly known as:  NORCO/VICODIN   mupirocin cream 2 % Commonly known as:  BACTROBAN     TAKE these medications   acetaminophen 500 MG tablet Commonly known as:  TYLENOL Take 1,500 mg by mouth every 6 (six) hours as needed for moderate pain.   apixaban 5 MG Tabs tablet Commonly known as:  ELIQUIS Take 2 tablets (10 mg total) by mouth 2 (two) times daily for 6 days.   apixaban 5 MG Tabs tablet Commonly known as:  ELIQUIS Take 1 tablet (5 mg total) by mouth 2 (two) times daily. Start taking on:  02/08/2017   furosemide 20 MG tablet Commonly known as:  LASIX Take 1 tablet (20 mg total) by mouth every other day as needed for fluid or edema.   gabapentin 100 MG capsule Commonly known as:  NEURONTIN Take 1 capsule (100 mg total) by mouth at bedtime.   magnesium oxide 400 (241.3 Mg) MG tablet Commonly known as:  MAG-OX Take 1 tablet (400 mg total) by mouth daily. Start taking on:  02/03/2017   naproxen sodium 220 MG tablet Commonly known as:  ALEVE Take 440 mg by mouth 2 (two) times daily as needed (pain).   polyethylene glycol packet Commonly known as:  MIRALAX / GLYCOLAX Take 17 g by mouth daily. Start taking on:  02/03/2017   predniSONE 10 MG tablet Commonly known as:  DELTASONE Label  & dispense according to the schedule below. 4Pills PO on day one then, 3 Pills PO on day two, 2Pills PO on day three, 1 Pills PO on day four,  then STOP.  Total of 10 tabs   senna-docusate 8.6-50 MG  tablet Commonly known as:  Senokot-S Take 1 tablet by mouth at bedtime.      Allergies  Allergen Reactions  . Augmentin [Amoxicillin-Pot Clavulanate] Hives and Itching    Did PCN reaction causing immediate rash, facial/tongue/throat swelling, SOB or lightheadedness with hypotension: no Did PCN reaction causing severe rash involving mucus membranes or skin necrosis: no Has patient had a PCN reaction that required hospitalization: in hospital Has patient had a PCN reaction occurring within the last 10 years: no If all of the above answers are "NO", then may proceed with  Cephalosporin use.  Pt reports no recollection of any reactions when taking penicillin in past  . Latex Hives    Local reaction (welts). Patient denies any wheezing or other reaction with latex exposure   Follow-up Information    Nicholes Stairs, MD Follow up.   Specialty:  Orthopedic Surgery Why:  left knee pain mri left knee" Mri left knee with severe tricompartmental OA as well as degeneration and tearing of both medial and lateral menisci.  Also with marked synovial thickening possible inflammatory arthropathy  Contact information: [address] [place] [address] [phone]        follow up with pmd,pmd to arrange mammogram Follow up.   Why:  pme to repeat pelvic CT at 3-6 months to follow up on pelvic adenopathy pmd to arrange mammogram and follow up on Axillary lymphadenopathy       please establish care with pmd,pmd to refer to rheumatology for elevated rheumatoids factor and possible inflammatory arthritis Follow up.            The results of significant diagnostics from this hospitalization (including imaging, microbiology, ancillary and laboratory) are listed below for reference.    Significant Diagnostic Studies: Dg Chest 2 View  Result Date: 01/25/2017 CLINICAL DATA:  Right-sided chest pain.  Shortness of breath. EXAM: CHEST  2 VIEW COMPARISON:  Radiograph  01/24/2013. Lung bases from abdominal CT earlier this day FINDINGS: Patchy opacity in the periphery of the right lung base, localizing to the lower lobe is seen on CT. Low lung volumes with crowding of bronchovascular structures. Apparent increased size of the cardiac silhouette likely secondary to technique, heart size upper normal and CT today. No pleural effusion or pneumothorax. Scoliotic curvature of [name]. IMPRESSION: Patchy peripheral right lung base opacity. This could represent pneumonia in the appropriate clinical setting. If there is clinical concern for pulmonary embolus, recommend chest CTA. Electronically Signed   By: Jeb Levering M.D.   On: 01/25/2017 22:00   Ct Angio Chest Pe W Or Wo Contrast  Result Date: 01/26/2017 CLINICAL DATA:  Right chest pain. EXAM: CT ANGIOGRAPHY CHEST WITH CONTRAST TECHNIQUE: Multidetector CT imaging of the chest was performed using the standard protocol during bolus administration of intravenous contrast. Multiplanar CT image reconstructions and MIPs were obtained to evaluate the vascular anatomy. CONTRAST:  100 cc Isovue 370 IV COMPARISON:  Radiographs and abdominal CT yesterday. FINDINGS: Cardiovascular: Technically limited exam due to breathing motion artifact, contrast bolus timing and soft tissue attenuation from habitus, however there are filling defects within the subsegmental right lower lobe pulmonary arteries. Possible filling defect within a subsegmental lingular pulmonary artery. More detailed assessment is limited. RV to LV ratio ratio of 1. No pericardial effusion, minimal pericardial fluid is physiologic. Mediastinum/Nodes: No enlarged mediastinal or hilar nodes. Left axillary adenopathy with rounded morphology, dominant node measuring 2.4 cm short axis. No right axillary adenopathy. Small hiatal hernia. Visualized thyroid gland is normal. Lungs/Pleura: Peripheral consolidation in the anterior right lower lobe. Minimal peripheral patchy consolidation  in the anterior lingula. Small right pleural effusion. No pulmonary edema. Breathing motion artifact limits more detailed assessment. Upper Abdomen: Assessed on abdominal CT earlier this day. No acute abnormality. Musculoskeletal: Scoliosis and degenerative change in the [name]. Possibly exophytic soft tissue density in the subcutaneous tissues about the right jaw. Review of the MIP images confirms the above findings. IMPRESSION: 1. Positive for pulmonary embolus in the subsegmental right lower lobe and possibly lingula. Probable pulmonary infarcts in the right middle  lobe and lingula. Small right pleural effusion has developed from abdominal CT yesterday. 2. Pathologic left axillary adenopathy, largest node measuring 2.4 cm short axis. Recommend mammographic evaluation to evaluate for breast malignancy. 3. Soft tissue density, possible exophytic skin lesion superficial to the right jaw. This is amenable to direct inspection/physical exam. Critical Value/emergent results were called by telephone at the time of interpretation on 01/26/2017 at 4:09 am to Dr. Ivor Costa , who verbally acknowledged these results. Electronically Signed   By: Jeb Levering M.D.   On: 01/26/2017 04:10   Ct Pelvis Wo Contrast  Result Date: 02/02/2017 CLINICAL DATA:  Chronic left hip pain. EXAM: CT PELVIS WITHOUT CONTRAST TECHNIQUE: Multidetector CT imaging of the pelvis was performed following the standard protocol without intravenous contrast. COMPARISON:  CT scan of January 25, 2017. FINDINGS: Urinary Tract:  No abnormality visualized. Bowel:  Unremarkable visualized pelvic bowel loops. Vascular/Lymphatic: No pathologically enlarged lymph nodes. No significant vascular abnormality seen. Reproductive:  No mass or other significant abnormality Other:  None. Musculoskeletal: No suspicious bone lesions identified. IMPRESSION: No definite abnormality seen in the pelvis. The hips are unremarkable. Electronically Signed   By: Marijo Conception, M.D.   On: 02/02/2017 12:45   Mr Lumbar [name] Wo Contrast  Result Date: 01/30/2017 CLINICAL DATA:  Chronic low back pain. EXAM: MRI LUMBAR [name] WITHOUT CONTRAST TECHNIQUE: Multiplanar, multisequence MR imaging of the lumbar [name] was performed. No intravenous contrast was administered. COMPARISON:  CT abdomen pelvis dated January 25, 2017. FINDINGS: Segmentation:  Standard. Alignment: Mild levoscoliosis, apex at L2-L3. Sagittal alignment is normal. Vertebrae: No fracture, evidence of discitis, or suspicious bone lesion. Diffusely decreased T2 and T1 marrow signal. The T1 marrow signal remains slightly increased in signal intensity with respect to muscle and the intervertebral disc. Small hemangioma is in the L3 and L4 vertebral bodies. Conus medullaris and cauda equina: Conus extends to the T12-L1 level. Conus and cauda equina appear normal. Paraspinal and other soft tissues: Negative. Disc levels: T11-T12: Only included on the sagittal images.  Negative. T12-L1:  Negative. L1-L2:  Negative. L2-L3:  Negative. L3-L4:  Negative. L4-L5: Small left foraminal disc extrusion. No spinal canal or neuroforaminal stenosis. L5-S1: Small left foraminal disc protrusion. No spinal canal or neuroforaminal stenosis. IMPRESSION: 1. Minimal degenerative disc disease at L4-L5 and L5-S1. No significant spinal canal or neuroforaminal stenosis at any level. 2. Diffusely decreased T2 and T1 marrow signal. T1 marrow signal remains slightly hyperintense to muscle and the intervertebral discs. Although this can be caused by marrow infiltrative processes, the most common causes include anemia, smoking, obesity, or advancing age. Electronically Signed   By: Titus Dubin M.D.   On: 01/30/2017 20:32   Ct Abdomen Pelvis W Contrast  Result Date: 01/25/2017 CLINICAL DATA:  Right-sided chest and upper abdominal pain. Nausea. Recent kidney infection. Ovarian/fallopian/ peritoneal cancer suspected. Prior hysterectomy. EXAM: CT  ABDOMEN AND PELVIS WITH CONTRAST TECHNIQUE: Multidetector CT imaging of the abdomen and pelvis was performed using the standard protocol following bolus administration of intravenous contrast. CONTRAST:  185mL ISOVUE-300 IOPAMIDOL (ISOVUE-300) INJECTION 61% COMPARISON:  12/31/2016 stone study. FINDINGS: Lower chest: Anterior right lower lobe airspace disease at the site of minimal scarring on the prior. There is also dependent and lingular presumed scarring. Mild cardiomegaly, without pericardial effusion. Tiny hiatal hernia. Hepatobiliary: Well-circumscribed right hepatic lobe low-density lesion is likely a cyst. Normal gallbladder, without biliary ductal dilatation. Pancreas: Normal, without mass or ductal dilatation. Spleen: Normal in size, without focal abnormality. Adrenals/Urinary  Tract: Normal adrenal glands. Bilateral too small to characterize renal lesions. Normal urinary bladder. Stomach/Bowel: Normal stomach, without wall thickening. Colonic stool burden suggests constipation. Normal terminal ileum. Appendix not visualized. Normal small bowel. Vascular/Lymphatic: Normal caliber of the aorta and branch vessels. No retroperitoneal or retrocrural adenopathy. Increased number and size of pelvic sidewall lymph nodes. Index 1.2 cm right external iliac node is similar to on the prior exam. There are enlarged inguinal nodes which are similar to on the recent priors. Reproductive: Hysterectomy.  No adnexal mass. Other: No significant free fluid. Prior ventral abdominal wall hernia repair. There is recurrent or residual small fat containing hernia inferior to the mesh. There is probable overlying scar/keloid. Musculoskeletal: Convex left lumbar [name] curvature. IMPRESSION: 1.  No acute process in the abdomen or pelvis. 2.  Possible constipation. 3. Pelvic adenopathy, similar on recent prior exams. This could be reactive. Correlate with any primary malignancy history. Consider follow-up pelvic CT at 3-6 months. If  any history of primary malignancy, consider PET. 4. Increased anterior right lung base opacity could represent an area of infection or given the clinical history of lower right chest pain, pulmonary infarct secondary to pulmonary embolism. If this is a concern, consider dedicated CTA of the chest. Electronically Signed   By: Abigail Miyamoto M.D.   On: 01/25/2017 20:23   Mr Knee Left Wo Contrast  Result Date: 01/31/2017 CLINICAL DATA:  Left knee pain for over 6 weeks. The patient is unable to bear weight. EXAM: MRI OF THE LEFT KNEE WITHOUT CONTRAST TECHNIQUE: Multiplanar, multisequence MR imaging of the knee was performed. No intravenous contrast was administered. COMPARISON:  None. FINDINGS: This examination is limited by the patient's body habitus. MENISCI Medial meniscus: Markedly degenerated and diminutive throughout. Horizontal tear at the junction of the posterior horn and body reaches the meniscal undersurface. Longitudinal tear is seen in the anterior body. Lateral meniscus: Longitudinal tear is present in the posterior horn. The body and anterior horn are severely degenerated and diminutive. The body is extruded peripherally. Horizontal tear is seen throughout the body. A parameniscal cyst measuring 1.2 cm craniocaudal by 0.5 cm transverse by 1.7 cm AP is seen along the body. LIGAMENTS Cruciates:  Intact.  There is mucoid degeneration of the ACL. Collaterals:  Intact. CARTILAGE Patellofemoral: Extensive cartilage loss is worst along the lateral patellar facet. Medial: Severe cartilage loss is present with bone-on-bone joint space narrowing. Lateral: Severe cartilage loss is present with bone-on-bone joint space narrowing. Joint: Small joint effusion is present. Synovium is diffusely and markedly thickened. Popliteal Fossa:  No Baker's cyst. Extensor Mechanism: Visualization is limited but there is extensive edema about the vastus lateralis component of the quadriceps tendon. The tendon at may be torn 6.5 cm  above the joint line. Bones: Extensive subchondral edema is present about both the medial and lateral compartments. Tricompartmental osteophytosis is seen. No fracture or worrisome lesion. Other: None. IMPRESSION: Limited examination due to the patient's size. Extensive edema about the vastus lateralis 6.5 cm above the joint line is worrisome for tear or high-grade strain. The remainder of the extensor mechanism appears intact. Severe, markedly advanced for age tricompartmental osteoarthritis. Associated degeneration and tearing of both the medial and lateral menisci is identified as described above. Parameniscal cyst formation along the body of the lateral meniscus is noted. Marked synovial thickening diffusely about the knee consistent with intense synovitis. Given symmetric medial and lateral compartment joint space narrowing, inflammatory arthropathy such as rheumatoid is within the differential with superimposed osteoarthritis.  Electronically Signed   By: Inge Rise M.D.   On: 01/31/2017 09:06    Microbiology: Recent Results (from the past 240 hour(s))  Culture, blood (Routine X 2) w Reflex to ID Panel     Status: None   Collection Time: 01/25/17  6:17 PM  Result Value Ref Range Status   Specimen Description BLOOD LEFT ANTECUBITAL  Final   Special Requests   Final    BOTTLES DRAWN AEROBIC AND ANAEROBIC Blood Culture adequate volume   Culture   Final    NO GROWTH 5 DAYS Performed at [facility], [address] [place], [place] [address]    Report Status 01/30/2017 FINAL  Final  Urine Culture     Status: Abnormal   Collection Time: 01/25/17  6:23 PM  Result Value Ref Range Status   Specimen Description URINE, CLEAN CATCH  Final   Special Requests NONE  Final   Culture MULTIPLE SPECIES PRESENT, SUGGEST RECOLLECTION (A)  Final   Report Status 01/27/2017 FINAL  Final  Culture, blood (Routine X 2) w Reflex to ID Panel     Status: None   Collection Time: 01/26/17  5:00 AM  Result  Value Ref Range Status   Specimen Description BLOOD RIGHT ANTECUBITAL  Final   Special Requests   Final    BOTTLES DRAWN AEROBIC AND ANAEROBIC Blood Culture adequate volume   Culture   Final    NO GROWTH 5 DAYS Performed at [facility], [place] [address] [place], [place] [address]    Report Status 01/31/2017 FINAL  Final  MRSA PCR Screening     Status: None   Collection Time: 01/26/17  5:57 PM  Result Value Ref Range Status   MRSA by PCR NEGATIVE NEGATIVE Final    Comment:        The GeneXpert MRSA Assay (FDA approved for NASAL specimens only), is one component of a comprehensive MRSA colonization surveillance program. It is not intended to diagnose MRSA infection nor to guide or monitor treatment for MRSA infections.      Labs: Basic Metabolic Panel: Recent Labs  Lab 01/27/17 0256 01/28/17 0601 01/29/17 0559 01/30/17 0558 01/31/17 0659 02/01/17 0610 02/02/17 0553  NA 135 137 134* 133* 133* 137 135  K 3.5 3.7 4.0 3.9 3.9 4.3 4.2  CL 105 105 103 103 102 106 104  CO2 25 26 25 25 25 25 25   GLUCOSE 113* 120* 107* 126* 108* 120* 149*  BUN 6 9 10 11 11 12 16   CREATININE 0.78 0.84 0.78 0.76 0.78 0.80 0.73  CALCIUM 8.8* 9.2 9.1 9.3 9.6 9.8 9.6  MG 1.7 1.8 1.9 1.8  --   --   --    Liver Function Tests: Recent Labs  Lab 01/27/17 0256  AST 20  ALT 14  ALKPHOS 48  BILITOT 1.0  PROT 7.1  ALBUMIN 2.9*   No results for input(s): LIPASE, AMYLASE in the last 168 hours. No results for input(s): AMMONIA in the last 168 hours. CBC: Recent Labs  Lab 01/29/17 0559 01/30/17 0558 01/31/17 0659 02/01/17 0610 02/02/17 0553  WBC 7.9 7.4 8.4 9.8 14.9*  HGB 9.5* 9.9* 9.7* 10.3* 9.7*  HCT 29.6* 30.6* 30.4* 32.8* 30.7*  MCV 87.1 87.7 87.6 88.4 87.2  PLT 356 363 405* 416* 419*   Cardiac Enzymes: No results for input(s): CKTOTAL, CKMB, CKMBINDEX, TROPONINI in the last 168 hours. BNP: BNP (last 3 results) Recent Labs    01/26/17 0617  BNP 29.9  ProBNP (last 3  results) No results for input(s): PROBNP in the last 8760 hours.  CBG: No results for input(s): GLUCAP in the last 168 hours.     Signed:  Florencia Reasons MD, PhD  [facility] 02/02/2017, 2:26 PM

## 2017-02-02 NOTE — Progress Notes (Signed)
Received report from [place], Therapist, sports. Assessment unchanged from previous assessment.

## 2017-02-02 NOTE — Progress Notes (Signed)
This chart was accessed by this writer on 12/7 at [facility] due to patient calling unit about discharge instructions and medications.

## 2017-02-02 NOTE — Progress Notes (Addendum)
Date: February 02, 2017 Velva Harman, BSN, [place], [place] Match progrram for meds, advanced hhc will do hhc. RX card for Eliquis given and instructions on how to get patient assistance from the pharmaceutical company.

## 2017-02-02 NOTE — Progress Notes (Signed)
Due to lack of insurance coverage. Patient will purchase a hinged brace over the counter at a medical supply store. MD aware.

## 2017-02-02 NOTE — Progress Notes (Signed)
OT Cancellation Note  Patient Details Name: [name] MRN: [id number] DOB: [birth year]   Cancelled Treatment:    Reason Eval/Treat Not Completed: Patient at procedure or test/ unavailable. Attempted OT evaluation. Patient off of th unit at this time. Will follow up for OT evaluation as able.  Eveleen Mcnear A Ahmari Duerson 02/02/2017, 12:11 PM

## 2017-02-02 NOTE — Clinical Social Work Note (Signed)
Clinical Social Work Assessment  Patient Details  Name: [name] MRN: [id number] Date of Birth: [birth year]  Date of referral:  02/02/17               Reason for consult:  Facility Placement, Discharge Planning                Permission sought to share information with:  Case Manager, Customer service manager, Family Supports Permission granted to share information::  Yes, Verbal Permission Granted  Name::     Olegario Shearer  Agency::  SNF; Medicaid Case worker  Relationship::  Mother  Contact Information:     Housing/Transportation Living arrangements for the past 2 months:  [place] of Information:  Patient Patient Interpreter Needed:  None Criminal Activity/Legal Involvement Pertinent to Current Situation/Hospitalization:  No - Comment as needed Significant Relationships:  Adult Children, Parents, Other Family Members Lives with:  Self Do you feel safe going back to the place where you live?  Yes Need for family participation in patient care:  Yes (Comment)  Care giving concerns:  Patient lives alone and is having difficulty standing and ambulating.    Social Worker assessment / plan:  LCSW following for SNF placement.  Patient admitted to the hospital for PE (pulmonary thromboembolism).  Patient has medical history significant of morbid obesity, asthma, fibroid, anemia, depression, anxiety.   LCSW met bedside with patient. No family present.  Patient  Reports that she lives alone. She has a 49 year old daughter with is pregnant and lives out side the home. She also has support from her mother but she is also limited in the assistance that she can provide.   Patient reports that he health has declined over the last few months. She was previously working and commuting to [place] daily. She reports that has her knee worsened the commute became more difficult. Patient was recently fired from her job.  Patient reports that she recently experienced a  significant death in her family. She had a cousin that she was caring for to pass.   Patient has not applied for unempolyment benefits.   Patient has no insurance or payor source at this time. Patient has applied for medicaid. Patient applied online and has not had any follow up.   PLAN: TBD pending payor source.    Employment status:  Unemployed Forensic scientist:  Self Pay (Medicaid Pending) PT Recommendations:  [place] / Referral to community resources:  [place]  Patient/Family's Response to care:  Patient was thankful of LCSW visit. Patient appears to be proactive in care.   Patient/Family's Understanding of and Emotional Response to Diagnosis, Current Treatment, and Prognosis:  Patient is understanding of her current diagnosis. Patient expressed concerns about incontinence and the cause. Patient is agreeable to current treatment plan.   Emotional Assessment Appearance:  Appears younger than stated age Attitude/Demeanor/Rapport:    Affect (typically observed):  Pleasant, Hopeful Orientation:  Oriented to Self, Oriented to Place, Oriented to  Time, Oriented to Situation Alcohol / Substance use:  Not Applicable Psych involvement (Current and /or in the community):  No (Comment)  Discharge Needs  Concerns to be addressed:  Financial / Insurance Concerns Readmission within the last 30 days:  No Current discharge risk:  Lives alone Barriers to Discharge:  Continued Medical Work up   [employer], LCSW 02/02/2017, 10:29 AM

## 2017-02-26 ENCOUNTER — Ambulatory Visit (INDEPENDENT_AMBULATORY_CARE_PROVIDER_SITE_OTHER): Payer: Self-pay | Admitting: Orthopaedic Surgery

## 2017-02-26 ENCOUNTER — Encounter (INDEPENDENT_AMBULATORY_CARE_PROVIDER_SITE_OTHER): Payer: Self-pay | Admitting: Orthopaedic Surgery

## 2017-02-26 ENCOUNTER — Ambulatory Visit (INDEPENDENT_AMBULATORY_CARE_PROVIDER_SITE_OTHER): Payer: Self-pay

## 2017-02-26 DIAGNOSIS — M1712 Unilateral primary osteoarthritis, left knee: Secondary | ICD-10-CM

## 2017-02-26 MED ORDER — BUPIVACAINE HCL 0.5 % IJ SOLN
2.0000 mL | INTRAMUSCULAR | Status: AC | PRN
  Administered 2017-02-26: 2 mL via INTRA_ARTICULAR

## 2017-02-26 MED ORDER — METHYLPREDNISOLONE ACETATE 40 MG/ML IJ SUSP
40.0000 mg | INTRAMUSCULAR | Status: AC | PRN
  Administered 2017-02-26: 40 mg via INTRA_ARTICULAR

## 2017-02-26 MED ORDER — LIDOCAINE HCL 1 % IJ SOLN
2.0000 mL | INTRAMUSCULAR | Status: AC | PRN
  Administered 2017-02-26: 2 mL

## 2017-02-26 NOTE — Progress Notes (Signed)
Office Visit Note   Patient: [name]           Date of Birth: 09/28/1965           MRN: [id number] Visit Date: 02/26/2017              Requested by: No referring provider defined for this encounter. PCP: Patient, No Pcp Per   Assessment & Plan: Visit Diagnoses:  1. Primary osteoarthritis of left knee     Plan: Impression is a 51 year old female with severe degenerative joint disease with partial tear of the vastus lateralis.  Overall extensor mechanism appears to be intact.  She does have extensive edema of the tibial plateau and femoral condyles cortisone injection was performed today to hopefully give her some pain relief.  From degenerative joint disease.  Instructed the patient to resume her Lasix.  She does have a history of keloid. Total face to face encounter time was greater than 45 minutes and over half of this time was spent in counseling and/or coordination of care.  Follow-Up Instructions: Return if symptoms worsen or fail to improve.   Orders:  Orders Placed This Encounter  Procedures  . XR KNEE 3 VIEW LEFT   No orders of the defined types were placed in this encounter.     Procedures: Large Joint Inj: L knee on 02/26/2017 5:45 PM Details: 22 G needle Medications: 2 mL bupivacaine 0.5 %; 2 mL lidocaine 1 %; 40 mg methylPREDNISolone acetate 40 MG/ML Outcome: tolerated well, no immediate complications Patient was prepped and draped in the usual sterile fashion.       Clinical Data: No additional findings.   Subjective: Chief Complaint  Patient presents with  . Left Knee - Pain    Patient is a 51 year old female who comes in with left knee pain with progressive worsening since August of this year.  She originally fell down the stairs.  She walks with a walker at home and it is a low demand a 51 year old female suffers from morbid obesity and severe lower extremity swelling.  She is currently doing physical therapy and used to work as a Furniture conservator/restorer.  She hurts throughout her left knee.  Denies any numbness and tingling.    Review of Systems  Constitutional: Negative.   HENT: Negative.   Eyes: Negative.   Respiratory: Negative.   Cardiovascular: Negative.   Endocrine: Negative.   Musculoskeletal: Negative.   Neurological: Negative.   Hematological: Negative.   Psychiatric/Behavioral: Negative.   All other systems reviewed and are negative.    Objective: Vital Signs: LMP 01/31/2011   Physical Exam  Constitutional: She is oriented to person, place, and time. She appears well-developed and well-nourished.  HENT:  Head: Normocephalic and atraumatic.  Eyes: EOM are normal.  Neck: Neck supple.  Pulmonary/Chest: Effort normal.  Abdominal: Soft.  Neurological: She is alert and oriented to person, place, and time.  Skin: Skin is warm. Capillary refill takes less than 2 seconds.  Psychiatric: She has a normal mood and affect. Her behavior is normal. Judgment and thought content normal.  Nursing note and vitals reviewed.   Ortho Exam Left knee exam shows no obvious evidence of effusion.  Collaterals and cruciates are stable.  Mild joint line tenderness.  She has significant weakness with extension.  There are no palpable defects within the quadriceps or patellar tendon. Specialty Comments:  No specialty comments available.  Imaging: Xr Knee 3 View Left  Result Date: 02/26/2017 Severe degenerative joint  disease with destruction of joint space and bone-on-bone articulation    PMFS History: Patient Active Problem List   Diagnosis Date Noted  . Arthritis   . PE (pulmonary thromboembolism) ([place]) 01/26/2017  . Axillary lymphadenopathy 01/26/2017  . Urinary incontinence 01/25/2017  . Hypokalemia 01/25/2017  . Lobar pneumonia ([place]) 01/25/2017  . Pressure ulcer of sacral region, stage 2 01/25/2017  . Abnormal ECG 01/29/2013  . Dyspnea   . Menorrhagia with regular cycle 02/22/2011  . Anemia 02/22/2011   Past  Medical History:  Diagnosis Date  . Anemia   . Anxiety   . Asthma    hx as child - no prob as adult - no inhaler  . Blood transfusion 01/22/11   [facility]fusion [facility]  . Depression 08/2010   psych assessment  . Dyspnea   . Fibroid   . Headache(784.0)    rx for imitrex - last one jan  . Keloid     Family History  Problem Relation Age of Onset  . Heart disease Mother   . Hypertension Mother   . Hypertension Father   . Hypertension Sister     Past Surgical History:  Procedure Laterality Date  . ABDOMINAL HYSTERECTOMY    . HERNIA REPAIR  9983   umbilical  . SVD     x 1   Social History   Occupational History  . Occupation: medical billing    Employer: [employer]  Tobacco Use  . Smoking status: Never Smoker  . Smokeless tobacco: Never Used  Substance and Sexual Activity  . Alcohol use: No  . Drug use: No  . Sexual activity: Yes    Birth control/protection: None, Surgical

## 2017-02-28 MED FILL — !ELIQUIS 5MG TABLET: 5 | 28 days supply | Qty: 56 | Fill #0

## 2017-03-09 ENCOUNTER — Ambulatory Visit: Payer: Self-pay | Attending: Internal Medicine | Admitting: Physician Assistant

## 2017-03-09 ENCOUNTER — Ambulatory Visit: Payer: Self-pay

## 2017-03-09 ENCOUNTER — Encounter: Payer: Self-pay | Admitting: Physician Assistant

## 2017-03-09 ENCOUNTER — Other Ambulatory Visit: Payer: Self-pay

## 2017-03-09 VITALS — BP 128/86 | HR 88 | Temp 98.1°F | Resp 16

## 2017-03-09 DIAGNOSIS — G8929 Other chronic pain: Secondary | ICD-10-CM | POA: Insufficient documentation

## 2017-03-09 DIAGNOSIS — Z1231 Encounter for screening mammogram for malignant neoplasm of breast: Secondary | ICD-10-CM

## 2017-03-09 DIAGNOSIS — M79662 Pain in left lower leg: Secondary | ICD-10-CM | POA: Insufficient documentation

## 2017-03-09 DIAGNOSIS — Z791 Long term (current) use of non-steroidal anti-inflammatories (NSAID): Secondary | ICD-10-CM | POA: Insufficient documentation

## 2017-03-09 DIAGNOSIS — M1712 Unilateral primary osteoarthritis, left knee: Secondary | ICD-10-CM

## 2017-03-09 DIAGNOSIS — J45909 Unspecified asthma, uncomplicated: Secondary | ICD-10-CM | POA: Insufficient documentation

## 2017-03-09 DIAGNOSIS — Z8249 Family history of ischemic heart disease and other diseases of the circulatory system: Secondary | ICD-10-CM | POA: Insufficient documentation

## 2017-03-09 DIAGNOSIS — R32 Unspecified urinary incontinence: Secondary | ICD-10-CM

## 2017-03-09 DIAGNOSIS — F329 Major depressive disorder, single episode, unspecified: Secondary | ICD-10-CM | POA: Insufficient documentation

## 2017-03-09 DIAGNOSIS — D649 Anemia, unspecified: Secondary | ICD-10-CM | POA: Insufficient documentation

## 2017-03-09 DIAGNOSIS — R590 Localized enlarged lymph nodes: Secondary | ICD-10-CM

## 2017-03-09 DIAGNOSIS — Z91040 Latex allergy status: Secondary | ICD-10-CM | POA: Insufficient documentation

## 2017-03-09 DIAGNOSIS — G2581 Restless legs syndrome: Secondary | ICD-10-CM | POA: Insufficient documentation

## 2017-03-09 DIAGNOSIS — R768 Other specified abnormal immunological findings in serum: Secondary | ICD-10-CM

## 2017-03-09 DIAGNOSIS — Z79899 Other long term (current) drug therapy: Secondary | ICD-10-CM | POA: Insufficient documentation

## 2017-03-09 DIAGNOSIS — K5900 Constipation, unspecified: Secondary | ICD-10-CM | POA: Insufficient documentation

## 2017-03-09 DIAGNOSIS — N390 Urinary tract infection, site not specified: Secondary | ICD-10-CM | POA: Insufficient documentation

## 2017-03-09 DIAGNOSIS — R600 Localized edema: Secondary | ICD-10-CM | POA: Insufficient documentation

## 2017-03-09 DIAGNOSIS — Z9889 Other specified postprocedural states: Secondary | ICD-10-CM | POA: Insufficient documentation

## 2017-03-09 DIAGNOSIS — Z880 Allergy status to penicillin: Secondary | ICD-10-CM | POA: Insufficient documentation

## 2017-03-09 DIAGNOSIS — F419 Anxiety disorder, unspecified: Secondary | ICD-10-CM | POA: Insufficient documentation

## 2017-03-09 DIAGNOSIS — I2699 Other pulmonary embolism without acute cor pulmonale: Secondary | ICD-10-CM

## 2017-03-09 DIAGNOSIS — Z7901 Long term (current) use of anticoagulants: Secondary | ICD-10-CM | POA: Insufficient documentation

## 2017-03-09 DIAGNOSIS — Z7952 Long term (current) use of systemic steroids: Secondary | ICD-10-CM | POA: Insufficient documentation

## 2017-03-09 DIAGNOSIS — D259 Leiomyoma of uterus, unspecified: Secondary | ICD-10-CM | POA: Insufficient documentation

## 2017-03-09 DIAGNOSIS — M25562 Pain in left knee: Secondary | ICD-10-CM | POA: Insufficient documentation

## 2017-03-09 LAB — POCT URINALYSIS DIPSTICK
Glucose, UA: NEGATIVE
Ketones, UA: NEGATIVE
Leukocytes, UA: NEGATIVE
Nitrite, UA: NEGATIVE
Spec Grav, UA: >=1.03 — AB (ref 1.010–1.025)
Urobilinogen, UA: 0.2 E.U./dL
pH, UA: 5 (ref 5.0–8.0)

## 2017-03-09 MED ORDER — FUROSEMIDE 20 MG PO TABS: 20.0000 mg | ORAL_TABLET | Freq: Every day | ORAL | 1 refills | Status: DC

## 2017-03-09 MED FILL — ?FUROSEMIDE 20 MG TABLET: 20 | 30 days supply | Qty: 30 | Fill #0

## 2017-03-09 NOTE — Progress Notes (Signed)
[name]  CWC:[id number]  VOH:[id number]  DOB - 1965-05-25  Chief Complaint  Patient presents with  . Hospitalization Follow-up    ED       Subjective:   [name] is a 52 y.o. female here today for establishment of care. She has a past medical history of morbid obesity, asthma, fibroids, anemia, depression mixed with anxiety. She presented to the hospital couple times in November with left flank pain, dark urine and frequency. Initially she was diagnosed with urinary tract infection and sent home. On 01/25/2017 she was admitted after presenting with pleuritic chest pain, shortness of breath, fever and the same urinary symptoms. She also was having some bilateral calf pain. Her d-dimer was abnormal which led to a CT scan of her chest which revealed pulmonary emboli in the right lower lobe. There was possibly an infiltrate superimposed as well. There was comments in regards to axillary lymphadenopathy. She was started on IV heparin and later transitioned to Eliquis. Echocardiogram showed normal LV function. She had 1 out of 4 diastolic dysfunction. Lower show me Dopplers were negative. She was started on antibiotics and took for 5 days. Blood cultures were negative.  Her hospital course was complicated by a buttocks wound, left knee pain (acute on chronic). An abnormal rheumatoid factor, bilateral lower extremity edema, constipation, restless legs and urinary incontinence.  She lives at home alone. Her mother is nearby and helps her a lot. She initially had a home health nurse and physical therapist, not a couple times weekly but this has been discontinued. She still is having issues with urinary incontinence. No chest pain. No shortness of breath. No leg pain. Does have edema in her lower extremities chronically. No abdominal pain. No nausea or vomiting. Compliant with her medications. Not active at all. Nonsmoker.  ROS: GEN: denies fever or chills, denies change in  weight Skin: denies lesions or rashes HEENT: denies headache, earache, epistaxis, sore throat, or neck pain LUNGS: denies SHOB, dyspnea, PND, orthopnea CV: denies CP or palpitations ABD: denies abd pain, N or V EXT: denies muscle spasms +swelling; + pain in lower ext on L, + weakness NEURO: denies numbness or tingling, denies sz, stroke or TIA  ALLERGIES: Allergies  Allergen Reactions  . Augmentin [Amoxicillin-Pot Clavulanate] Hives and Itching    Did PCN reaction causing immediate rash, facial/tongue/throat swelling, SOB or lightheadedness with hypotension: no Did PCN reaction causing severe rash involving mucus membranes or skin necrosis: no Has patient had a PCN reaction that required hospitalization: in hospital Has patient had a PCN reaction occurring within the last 10 years: no If all of the above answers are "NO", then may proceed with Cephalosporin use.  Pt reports no recollection of any reactions when taking penicillin in past  . Latex Hives    Local reaction (welts). Patient denies any wheezing or other reaction with latex exposure    PAST MEDICAL HISTORY: Past Medical History:  Diagnosis Date  . Anemia   . Anxiety   . Asthma    hx as child - no prob as adult - no inhaler  . Blood transfusion 01/22/11   transfusion 2 units [facility]  . Depression 08/2010   psych assessment  . Dyspnea   . Fibroid   . Headache(784.0)    rx for imitrex - last one jan  . Keloid     PAST SURGICAL HISTORY: Past Surgical History:  Procedure Laterality Date  . ABDOMINAL HYSTERECTOMY    . HERNIA REPAIR  2009  umbilical  . SVD     x 1    MEDICATIONS AT HOME: Prior to Admission medications   Medication Sig Start Date End Date Taking? Authorizing Provider  apixaban (ELIQUIS) 5 MG TABS tablet Take 1 tablet (5 mg total) by mouth 2 (two) times daily. 02/08/17  Yes Florencia Reasons, MD  acetaminophen (TYLENOL) 500 MG tablet Take 1,500 mg by mouth every 6 (six) hours as needed for moderate pain.     [provider]  apixaban (ELIQUIS) 5 MG TABS tablet Take 2 tablets (10 mg total) by mouth 2 (two) times daily for 6 days. 02/02/17 02/08/17  Florencia Reasons, MD  furosemide (LASIX) 20 MG tablet Take 1 tablet (20 mg total) by mouth daily. 03/09/17   Brayton Caves, PA-C  gabapentin (NEURONTIN) 100 MG capsule Take 1 capsule (100 mg total) by mouth at bedtime. Patient not taking: Reported on 03/09/2017 02/02/17   Florencia Reasons, MD  magnesium oxide (MAG-OX) 400 (241.3 Mg) MG tablet Take 1 tablet (400 mg total) by mouth daily. Patient not taking: Reported on 03/09/2017 02/03/17   Florencia Reasons, MD  naproxen sodium (ANAPROX) 220 MG tablet Take 440 mg by mouth 2 (two) times daily as needed (pain).     [provider]  polyethylene glycol (MIRALAX / GLYCOLAX) packet Take 17 g by mouth daily. Patient not taking: Reported on 03/09/2017 02/03/17   Florencia Reasons, MD  predniSONE (DELTASONE) 10 MG tablet Label  & dispense according to the schedule below. 4Pills PO on day one then, 3 Pills PO on day two, 2Pills PO on day three, 1 Pills PO on day four,  then STOP.  Total of 10 tabs Patient not taking: Reported on 03/09/2017 02/02/17   Florencia Reasons, MD  senna-docusate (SENOKOT-S) 8.6-50 MG tablet Take 1 tablet by mouth at bedtime. Patient not taking: Reported on 03/09/2017 02/02/17   Florencia Reasons, MD    Family History  Problem Relation Age of Onset  . Heart disease Mother   . Hypertension Mother   . Hypertension Father   . Hypertension Sister    Social-lives alone, not working, nonsmoker  Objective:   Vitals:   03/09/17 1133  BP: 128/86  Pulse: 88  Resp: 16  Temp: 98.1 F (36.7 C)  TempSrc: Axillary  SpO2: 98%    Exam General appearance : obese. Awake, alert, not in any distress. Speech Clear. Not toxic looking HEENT: Atraumatic and Normocephalic, pupils equally reactive to light and accomodation Neck: supple, no JVD. No cervical lymphadenopathy.  Chest:Good air entry bilaterally, no added sounds  CVS: S1 S2  regular, no murmurs.  Abdomen: Bowel sounds present, Non tender and not distended with no guarding, rigidity or rebound. Extremities: B/L Lower Ext shows 2+ ankle edema, both legs are warm to touch Neurology: Awake alert, and oriented X 3, CN II-XII intact, Non focal Skin:No Rash Wounds:N/A  Data Review Lab Results  Component Value Date   HGBA1C 5.6 01/31/2017     Assessment & Plan  1. Pulmonary Emboli  -6-9 mo Eliquis   -inc mobility, risk factor modification 2. Possible superimposed PNA  -Negative BC  -Antibiotics completed 3. Axillary/pelvic Lymphadenopathy  -referral for mmaogram  -repeat CT imaging in 3-6 mo per radiology 4. Urinary incontinence  -lifestyle modification (pelivic floor exercises, weight loss)  -no meds given 5. LEE (? lymphedema)  -lasix refill  -TED hose 6. Abn rheumatoid factor  -referral to rheumatology 7. Acute on chronic left knee pain  -ortho following   Update  BMP, CBC today Financial counselor appt Return in about 2 weeks (around 03/23/2017).  The patient was given clear instructions to go to ER or return to medical center if symptoms don't improve, worsen or new problems develop. The patient verbalized understanding. The patient was told to call to get lab results if they haven't heard anything in the next week.   Total time spent with patient was 48 min. Greater than 50 % of this visit was spent face to face counseling and coordinating care regarding risk factor modification, compliance importance and encouragement, education related to her multiple medical problems.  This note has been created with Surveyor, quantity. Any transcriptional errors are unintentional.    Zettie Pho, PA-C [facility] [place], [place]   03/09/2017, 1:22 PM

## 2017-03-09 NOTE — Progress Notes (Signed)
Need RF on lasix Follow PNA and PE  incontinence x >1 month

## 2017-03-13 LAB — BASIC METABOLIC PANEL
BUN/Creatinine Ratio: 13 (ref 9–23)
BUN: 10 mg/dL (ref 6–24)
CO2: 24 mmol/L (ref 20–29)
Calcium: 9.6 mg/dL (ref 8.7–10.2)
Chloride: 105 mmol/L (ref 96–106)
Creatinine, Ser: 0.75 mg/dL (ref 0.57–1.00)
GFR calc Af Amer: 108 mL/min/{1.73_m2} (ref >59)
GFR calc non Af Amer: 94 mL/min/{1.73_m2} (ref >59)
Glucose: 108 mg/dL — ABNORMAL HIGH (ref 65–99)
Potassium: 4.2 mmol/L (ref 3.5–5.2)
Sodium: 139 mmol/L (ref 134–144)

## 2017-03-13 LAB — CBC WITH DIFFERENTIAL/PLATELET
Basophils Absolute: 0 10*3/uL (ref 0.0–0.2)
Basos: 0 %
EOS (ABSOLUTE): 0.2 10*3/uL (ref 0.0–0.4)
Eos: 4 %
Hematocrit: 34.6 % (ref 34.0–46.6)
Hemoglobin: 11.6 g/dL (ref 11.1–15.9)
Immature Grans (Abs): 0 10*3/uL (ref 0.0–0.1)
Immature Granulocytes: 1 %
Lymphocytes Absolute: 1.5 10*3/uL (ref 0.7–3.1)
Lymphs: 29 %
MCH: 28.3 pg (ref 26.6–33.0)
MCHC: 33.5 g/dL (ref 31.5–35.7)
MCV: 84 fL (ref 79–97)
Monocytes Absolute: 0.5 10*3/uL (ref 0.1–0.9)
Monocytes: 10 %
Neutrophils Absolute: 3 10*3/uL (ref 1.4–7.0)
Neutrophils: 56 %
Platelets: 411 10*3/uL — ABNORMAL HIGH (ref 150–379)
RBC: 4.1 x10E6/uL (ref 3.77–5.28)
RDW: 15 % (ref 12.3–15.4)
WBC: 5.3 10*3/uL (ref 3.4–10.8)

## 2017-03-23 MED FILL — !ELIQUIS 5MG TABLET: 5 | 30 days supply | Qty: 60 | Fill #1

## 2017-03-26 ENCOUNTER — Encounter: Payer: Self-pay | Admitting: Nurse Practitioner

## 2017-03-26 ENCOUNTER — Ambulatory Visit: Payer: Self-pay | Attending: Nurse Practitioner | Admitting: Nurse Practitioner

## 2017-03-26 VITALS — BP 116/75 | HR 99 | Temp 98.6°F | Ht 72.0 in | Wt 279.6 lb

## 2017-03-26 DIAGNOSIS — Z79899 Other long term (current) drug therapy: Secondary | ICD-10-CM | POA: Insufficient documentation

## 2017-03-26 DIAGNOSIS — F329 Major depressive disorder, single episode, unspecified: Secondary | ICD-10-CM | POA: Insufficient documentation

## 2017-03-26 DIAGNOSIS — I2699 Other pulmonary embolism without acute cor pulmonale: Secondary | ICD-10-CM | POA: Insufficient documentation

## 2017-03-26 DIAGNOSIS — Z6837 Body mass index (BMI) 37.0-37.9, adult: Secondary | ICD-10-CM | POA: Insufficient documentation

## 2017-03-26 DIAGNOSIS — F419 Anxiety disorder, unspecified: Secondary | ICD-10-CM | POA: Insufficient documentation

## 2017-03-26 DIAGNOSIS — L89152 Pressure ulcer of sacral region, stage 2: Secondary | ICD-10-CM | POA: Insufficient documentation

## 2017-03-26 DIAGNOSIS — M1990 Unspecified osteoarthritis, unspecified site: Secondary | ICD-10-CM

## 2017-03-26 DIAGNOSIS — M7989 Other specified soft tissue disorders: Secondary | ICD-10-CM | POA: Insufficient documentation

## 2017-03-26 DIAGNOSIS — M1712 Unilateral primary osteoarthritis, left knee: Secondary | ICD-10-CM | POA: Insufficient documentation

## 2017-03-26 DIAGNOSIS — R32 Unspecified urinary incontinence: Secondary | ICD-10-CM | POA: Insufficient documentation

## 2017-03-26 DIAGNOSIS — Z7689 Persons encountering health services in other specified circumstances: Secondary | ICD-10-CM | POA: Insufficient documentation

## 2017-03-26 DIAGNOSIS — G2581 Restless legs syndrome: Secondary | ICD-10-CM | POA: Insufficient documentation

## 2017-03-26 DIAGNOSIS — K5900 Constipation, unspecified: Secondary | ICD-10-CM | POA: Insufficient documentation

## 2017-03-26 DIAGNOSIS — Z880 Allergy status to penicillin: Secondary | ICD-10-CM | POA: Insufficient documentation

## 2017-03-26 DIAGNOSIS — Z7901 Long term (current) use of anticoagulants: Secondary | ICD-10-CM | POA: Insufficient documentation

## 2017-03-26 DIAGNOSIS — R590 Localized enlarged lymph nodes: Secondary | ICD-10-CM | POA: Insufficient documentation

## 2017-03-26 NOTE — Progress Notes (Signed)
Assessment & Plan:  [name] was seen today for establish care.  Diagnoses and all orders for this visit:  PE (pulmonary thromboembolism) ([place]) Continue eliquis as prescribed  Arthritis Referral to Rheumatology Continue follow up with Orthopedics as instructed  Pressure ulcer of sacral region, stage 2 Patient reports ulcer has resolved  Axillary lymphadenopathy Referral made to [facility]  Urinary incontinence, unspecified type Labs pending.  Leg swelling Continue lasix as prescribed DASH diet    Patient has been counseled on age-appropriate routine health concerns for screening and prevention. These are reviewed and up-to-date. Referrals have been placed accordingly. Immunizations are up-to-date or declined.    Subjective:   Chief Complaint  Patient presents with  . Establish Care    Patient is here to establish care. Patient stated she have pain on her left knee for a while. Patient would like to talk about rheumatology referrals with the PCP    HPI [name] 52 y.o. female presents to office today to establish care. She has a history of  RLL PE (on eliquis), RLL PNA and has complaints of left knee pain.  She also endorses intermittent restless leg activity at night.  She is accompanied by her mother today.   Left knee pain She is morbidly obese. She describes the pain as aching. She has a history of left knee OA. She has a history of a fall in which she fell down a flight of stairs. She is currently in a wheelchair today. She sees Orthopedics for her knee.  Saw Dr. Carlena Bjornstad 3-4 weeks ago for a steroid injection and was instructed to lose weight before surgery can be performed. She has worked with physical therapy in the past as well as OT and continues to perform home exercises a few times per day. She uses a rolling and stationary walker at home and wheelchair when she is out.    Constipation Has bowel movements 2-3 times a week. "That's good for me" Initially she was  having 1 bowel movement every week or so. She takes OTC cranberry tablets for relief of constipation which provides significant relief of her symptoms.   Incontinence She has urge incontinence. Has to wear protective undergarments. Endorses difficulty making it to the bathroom in time. This is a new occurrence.  Onset was 2 months ago after she was diagnosed and treated for pyelonephritis in 12-2016. Her UA on 01-15-2018 was essentially normal aside from a slightly elevated specific gravity. She denies hematuria.   Edema Chronic. Left leg, bilateral feet and ankles. She takes lasix every other day instead of daily due to her incontinence issues. She has significant edema of the LLE and poor foot hygiene.     PE Diagnosed with PE 01-26-2017. Will need to be on Eliquis for 6-9 months or more unless she become more mobile. She does endorse shortness of breath with exertion. Denies cough or hemoptysis.     Review of Systems  Constitutional: Negative for fever, malaise/fatigue and weight loss.  HENT: Negative.  Negative for nosebleeds.   Eyes: Negative.  Negative for blurred vision, double vision and photophobia.  Respiratory: Positive for shortness of breath. Negative for cough.   Cardiovascular: Positive for leg swelling. Negative for chest pain and palpitations.  Gastrointestinal: Positive for constipation. Negative for abdominal pain, diarrhea, heartburn, nausea and vomiting.  Genitourinary: Positive for urgency.  Musculoskeletal: Positive for joint pain. Negative for myalgias.  Skin:       keloids  Neurological: Positive for weakness. Negative for  dizziness, focal weakness, seizures and headaches.  Endo/Heme/Allergies: Negative for environmental allergies.  Psychiatric/Behavioral: Negative.  Negative for suicidal ideas.    Past Medical History:  Diagnosis Date  . Anemia   . Anxiety   . Asthma    hx as child - no prob as adult - no inhaler  . Blood transfusion 01/22/11    transfusion 2 units at [facility]  . Depression 08/2010   psych assessment  . Dyspnea   . Fibroid   . Headache(784.0)    rx for imitrex - last one jan  . Keloid     Past Surgical History:  Procedure Laterality Date  . ABDOMINAL HYSTERECTOMY    . HERNIA REPAIR  4481   umbilical  . SVD     x 1    Family History  Problem Relation Age of Onset  . Heart disease Mother   . Hypertension Mother   . Hypertension Father   . Hypertension Sister     Social History Reviewed with no changes to be made today.   Outpatient Medications Prior to Visit  Medication Sig Dispense Refill  . apixaban (ELIQUIS) 5 MG TABS tablet Take 1 tablet (5 mg total) by mouth 2 (two) times daily. 60 tablet 3  . furosemide (LASIX) 20 MG tablet Take 1 tablet (20 mg total) by mouth daily. 30 tablet 1  . acetaminophen (TYLENOL) 500 MG tablet Take 1,500 mg by mouth every 6 (six) hours as needed for moderate pain.    . magnesium oxide (MAG-OX) 400 (241.3 Mg) MG tablet Take 1 tablet (400 mg total) by mouth daily. (Patient not taking: Reported on 03/09/2017) 30 tablet 0  . naproxen sodium (ANAPROX) 220 MG tablet Take 440 mg by mouth 2 (two) times daily as needed (pain).     Marland Kitchen apixaban (ELIQUIS) 5 MG TABS tablet Take 2 tablets (10 mg total) by mouth 2 (two) times daily for 6 days. 22 tablet 0  . gabapentin (NEURONTIN) 100 MG capsule Take 1 capsule (100 mg total) by mouth at bedtime. (Patient not taking: Reported on 03/09/2017) 30 capsule 0  . polyethylene glycol (MIRALAX / GLYCOLAX) packet Take 17 g by mouth daily. (Patient not taking: Reported on 03/09/2017) 14 each 0  . predniSONE (DELTASONE) 10 MG tablet Label  & dispense according to the schedule below. 4Pills PO on day one then, 3 Pills PO on day two, 2Pills PO on day three, 1 Pills PO on day four,  then STOP.  Total of 10 tabs (Patient not taking: Reported on 03/09/2017) 10 tablet 0  . senna-docusate (SENOKOT-S) 8.6-50 MG tablet Take 1 tablet by mouth at bedtime. (Patient not  taking: Reported on 03/09/2017) 30 tablet 0   No facility-administered medications prior to visit.     Allergies  Allergen Reactions  . Augmentin [Amoxicillin-Pot Clavulanate] Hives and Itching    Did PCN reaction causing immediate rash, facial/tongue/throat swelling, SOB or lightheadedness with hypotension: no Did PCN reaction causing severe rash involving mucus membranes or skin necrosis: no Has patient had a PCN reaction that required hospitalization: in hospital Has patient had a PCN reaction occurring within the last 10 years: no If all of the above answers are "NO", then may proceed with Cephalosporin use.  Pt reports no recollection of any reactions when taking penicillin in past  . Latex Hives    Local reaction (welts). Patient denies any wheezing or other reaction with latex exposure       Objective:    BP 116/75 (BP  Location: Left Arm, Patient Position: Sitting, Cuff Size: Large)   Pulse 99   Temp 98.6 F (37 C) (Oral)   Ht 6' (1.829 m)   Wt 279 lb 9.6 oz (126.8 kg)   LMP 01/31/2011   SpO2 97%   BMI 37.92 kg/m  Wt Readings from Last 3 Encounters:  03/26/17 279 lb 9.6 oz (126.8 kg)  01/25/17 285 lb (129.3 kg)  12/30/16 285 lb (129.3 kg)    Physical Exam  Constitutional: She is oriented to person, place, and time. She appears well-developed. She is cooperative.  Obese  HENT:  Head: Normocephalic and atraumatic.  Eyes: EOM are normal.  Neck: Normal range of motion.    Cardiovascular: Regular rhythm and normal heart sounds. Tachycardia present. Exam reveals no gallop and no friction rub.  No murmur heard. Pulmonary/Chest: Effort normal and breath sounds normal. No tachypnea. No respiratory distress. She has no decreased breath sounds. She has no wheezes. She has no rhonchi. She has no rales. She exhibits no tenderness.  Abdominal: Soft. Bowel sounds are normal.  Musculoskeletal: Normal range of motion. She exhibits edema.       Left knee: She exhibits  swelling.       Right ankle: She exhibits swelling.       Left ankle: She exhibits swelling.       Left upper arm: She exhibits deformity (left axillary mass).       Right lower leg: She exhibits edema.       Left lower leg: She exhibits edema.       Right foot: There is swelling (2+ non pitting edema).       Left foot: There is swelling (2+ non pitting edema).  Neurological: She is alert and oriented to person, place, and time. Gait (she is in a wheelchair) abnormal. Coordination normal.  Skin: Skin is warm and dry.  Psychiatric: She has a normal mood and affect. Her speech is normal and behavior is normal. Judgment and thought content normal. Cognition and memory are normal.  Nursing note and vitals reviewed.      Patient has been counseled extensively about nutrition and exercise as well as the importance of adherence with medications and regular follow-up. The patient was given clear instructions to go to ER or return to medical center if symptoms don't improve, worsen or new problems develop. The patient verbalized understanding.   Follow-up: Return in about 4 weeks (around 04/23/2017) for f/u for labs and chronic medical issues; 30 minute appt.   Gildardo Pounds, FNP-BC [facility] [place], [place]   03/29/2017, 1:04 AM

## 2017-03-26 NOTE — Patient Instructions (Addendum)
Edema Edema is an abnormal buildup of fluids in your bodytissues. Edema is somewhatdependent on gravity to pull the fluid to the lowest place in your body. That makes the condition more common in the legs and thighs (lower extremities). Painless swelling of the feet and ankles is common and becomes more likely as you get older. It is also common in looser tissues, like around your eyes. When the affected area is squeezed, the fluid may move out of that spot and leave a dent for a few moments. This dent is called pitting. What are the causes? There are many possible causes of edema. Eating too much salt and being on your feet or sitting for a long time can cause edema in your legs and ankles. Hot weather may make edema worse. Common medical causes of edema include:  Heart failure.  Liver disease.  Kidney disease.  Weak blood vessels in your legs.  Cancer.  An injury.  Pregnancy.  Some medications.  Obesity.  What are the signs or symptoms? Edema is usually painless.Your skin may look swollen or shiny. How is this diagnosed? Your health care provider may be able to diagnose edema by asking about your medical history and doing a physical exam. You may need to have tests such as X-rays, an electrocardiogram, or blood tests to check for medical conditions that may cause edema. How is this treated? Edema treatment depends on the cause. If you have heart, liver, or kidney disease, you need the treatment appropriate for these conditions. General treatment may include:  Elevation of the affected body part above the level of your heart.  Compression of the affected body part. Pressure from elastic bandages or support stockings squeezes the tissues and forces fluid back into the blood vessels. This keeps fluid from entering the tissues.  Restriction of fluid and salt intake.  Use of a water pill (diuretic). These medications are appropriate only for some types of edema. They pull fluid  out of your body and make you urinate more often. This gets rid of fluid and reduces swelling, but diuretics can have side effects. Only use diuretics as directed by your health care provider.  Follow these instructions at home:  Keep the affected body part above the level of your heart when you are lying down.  Do not sit still or stand for prolonged periods.  Do not put anything directly under your knees when lying down.  Do not wear constricting clothing or garters on your upper legs.  Exercise your legs to work the fluid back into your blood vessels. This may help the swelling go down.  Wear elastic bandages or support stockings to reduce ankle swelling as directed by your health care provider.  Eat a low-salt diet to reduce fluid if your health care provider recommends it.  Only take medicines as directed by your health care provider. Contact a health care provider if:  Your edema is not responding to treatment.  You have heart, liver, or kidney disease and notice symptoms of edema.  You have edema in your legs that does not improve after elevating them.  You have sudden and unexplained weight gain. Get help right away if:  You develop shortness of breath or chest pain.  You cannot breathe when you lie down.  You develop pain, redness, or warmth in the swollen areas.  You have heart, liver, or kidney disease and suddenly get edema.  You have a fever and your symptoms suddenly get worse. This information is   not intended to replace advice given to you by your health care provider. Make sure you discuss any questions you have with your health care provider. Document Released: 02/13/2005 Document Revised: 07/22/2015 Document Reviewed: 12/06/2012 Elsevier Interactive Patient Education  2017 Elsevier Inc.  

## 2017-03-29 ENCOUNTER — Encounter: Payer: Self-pay | Admitting: Nurse Practitioner

## 2017-04-02 ENCOUNTER — Other Ambulatory Visit (HOSPITAL_COMMUNITY): Payer: Self-pay | Admitting: *Deleted

## 2017-04-02 DIAGNOSIS — R2232 Localized swelling, mass and lump, left upper limb: Secondary | ICD-10-CM

## 2017-04-04 ENCOUNTER — Ambulatory Visit: Payer: Self-pay | Attending: Internal Medicine

## 2017-04-19 ENCOUNTER — Ambulatory Visit (HOSPITAL_COMMUNITY): Payer: Self-pay

## 2017-04-19 ENCOUNTER — Other Ambulatory Visit: Payer: Self-pay

## 2017-04-23 ENCOUNTER — Other Ambulatory Visit (HOSPITAL_COMMUNITY): Payer: Self-pay | Admitting: *Deleted

## 2017-04-23 DIAGNOSIS — N6320 Unspecified lump in the left breast, unspecified quadrant: Secondary | ICD-10-CM

## 2017-04-24 ENCOUNTER — Ambulatory Visit: Payer: Self-pay | Admitting: Nurse Practitioner

## 2017-04-25 MED FILL — !ELIQUIS 5MG TABLET: 5 | 30 days supply | Qty: 60 | Fill #2

## 2017-05-01 ENCOUNTER — Telehealth: Payer: Self-pay

## 2017-05-01 NOTE — Telephone Encounter (Signed)
Call placed to the patient at the request of Armstead Peaks, [facility] Financial Counselor to discuss the patient's housing concerns.  The patient stated that she will be loosing her housing " soon" stating that the landlord is planning to sell the property and needs to get it ready to sell and she is not able to afford the rent.   She stated that she will need to move in with her mother but her mother's home has 3 floors with multiple stairs and it is difficult for her to negotiate the stairs.  . This CM explained the services provided by the [employer] but the patient does not have income and is unable to  pay rent. This CM explained that [facility] works with [employer] [place] and they may be able to assist her and if not, they may be able to provide her with housing options. She said she would think about it. Explained to her that she would need to sign a consent for a referral to [employer]. She could come into the clinic at any time to sign the consent or she can wait until her clinic appointment on 05/09/17 if she is interested.  She inquired about possible placement in rehab facility for a couple of weeks but she has no insurance coverage for this.  She said that she would consider the services of [employer].   Update provided to D. Botello.

## 2017-05-09 ENCOUNTER — Ambulatory Visit: Payer: Self-pay | Attending: Nurse Practitioner | Admitting: Nurse Practitioner

## 2017-05-09 ENCOUNTER — Encounter: Payer: Self-pay | Admitting: Licensed Clinical Social Worker

## 2017-05-09 ENCOUNTER — Encounter: Payer: Self-pay | Admitting: Nurse Practitioner

## 2017-05-09 VITALS — BP 121/81 | HR 101 | Temp 98.2°F

## 2017-05-09 DIAGNOSIS — R32 Unspecified urinary incontinence: Secondary | ICD-10-CM | POA: Insufficient documentation

## 2017-05-09 DIAGNOSIS — M069 Rheumatoid arthritis, unspecified: Secondary | ICD-10-CM | POA: Insufficient documentation

## 2017-05-09 DIAGNOSIS — M083 Juvenile rheumatoid polyarthritis (seronegative): Secondary | ICD-10-CM

## 2017-05-09 DIAGNOSIS — Z79899 Other long term (current) drug therapy: Secondary | ICD-10-CM | POA: Insufficient documentation

## 2017-05-09 DIAGNOSIS — I2699 Other pulmonary embolism without acute cor pulmonale: Secondary | ICD-10-CM | POA: Insufficient documentation

## 2017-05-09 DIAGNOSIS — F419 Anxiety disorder, unspecified: Secondary | ICD-10-CM | POA: Insufficient documentation

## 2017-05-09 DIAGNOSIS — J45909 Unspecified asthma, uncomplicated: Secondary | ICD-10-CM | POA: Insufficient documentation

## 2017-05-09 DIAGNOSIS — M0580 Other rheumatoid arthritis with rheumatoid factor of unspecified site: Secondary | ICD-10-CM

## 2017-05-09 DIAGNOSIS — Z880 Allergy status to penicillin: Secondary | ICD-10-CM | POA: Insufficient documentation

## 2017-05-09 DIAGNOSIS — F329 Major depressive disorder, single episode, unspecified: Secondary | ICD-10-CM | POA: Insufficient documentation

## 2017-05-09 DIAGNOSIS — M130 Polyarthritis, unspecified: Secondary | ICD-10-CM | POA: Insufficient documentation

## 2017-05-09 DIAGNOSIS — Z800 Family history of malignant neoplasm of digestive organs: Secondary | ICD-10-CM | POA: Insufficient documentation

## 2017-05-09 NOTE — Patient Instructions (Addendum)
Pulmonary Embolism A pulmonary embolism (PE) is a sudden blockage or decrease of blood flow in one lung or both lungs. Most blockages come from a blood clot that forms in a lower leg, thigh, or arm vein (deep vein thrombosis, DVT) and travels to the lungs. A clot is blood that has thickened into a gel or solid. PE is a dangerous and life-threatening condition that needs to be treated right away. What are the causes? This condition is usually caused by a blood clot that forms in a vein and moves to the lungs. In rare cases, it may be caused by air, fat, part of a tumor, or other tissue that moves through the veins and into the lungs. What increases the risk? The following factors may make you more likely to develop this condition:  Having DVT or a history of DVT.  Being older than age 60.  Personal or family history of blood clots or blood clotting disease.  Major or lengthy surgery.  Orthopedic surgery, especially hip or knee replacement.  Traumatic injury, such as breaking a hip or leg.  Spinal cord injury.  Stroke.  Taking medicines that contain estrogen. These include birth control pills and hormone replacement therapy.  Long-term (chronic) lung or heart disease.  Cancer and chemotherapy.  Having a central venous catheter.  Pregnancy and the period after delivery.  What are the signs or symptoms? Symptoms of this condition usually start suddenly and include:  Shortness of breath while active or at rest.  Coughing or coughing up blood or blood-tinged mucus.  Chest pain that is often worse with deep breaths.  Rapid or irregular heartbeat.  Feeling light-headed or dizzy.  Fainting.  Feeling anxious.  Sweating.  Pain and swelling in a leg. This is a symptom of DVT, which can lead to PE.  How is this diagnosed? This condition may be diagnosed based on:  Your medical history.  A physical exam.  Blood tests to check blood oxygen level and how well your blood  clots, and a D-dimer blood test, which checks your blood for a substance that is released when a blood clot breaks apart.  CT pulmonary angiogram. This test checks blood flow in and around your lungs.  Ventilation-perfusion scan, also called a lung VQ scan. This test measures air flow and blood flow to the lungs.  Ultrasound of the legs to look for blood clots.  How is this treated? Treatment for this conditions depends on many factors, such as the cause of your PE, your risk for bleeding or developing more clots, and other medical conditions you have. Treatment aims to remove, dissolve, or stop blood clots from forming or growing larger. Treatment may include:  Blood thinning medicines (anticoagulants) to stop clots from forming or growing. These medicines may be given as a pill, as an injection, or through an IV tube (infusion).  Medicines that dissolve clots (thrombolytics).  A procedure in which a flexible tube is used to remove a blood clot (embolectomy) or deliver medicine to destroy it (catheter-directed thrombolysis).  A procedure in which a filter is inserted into a large vein that carries blood to the heart (inferior vena cava). This filter (vena cava filter) catches blood clots before they reach the lungs.  Surgery to remove the clot (surgical embolectomy). This is rare.  You may need a combination of immediate, long-term (up to 3 months after diagnosis), and extended (more than 3 months after diagnosis) treatments. Your treatment may continue for several months (maintenance therapy).   You and your health care provider will work together to choose the treatment program that is best for you. Follow these instructions at home: If you are taking an anticoagulant medicine:  Take the medicine every day at the same time each day.  Understand what foods and drugs interact with your medicine.  Understand the side effects of this medicine, including excessive bruising or bleeding. Ask  your health care provider or pharmacist about other side effects. General instructions  Take over-the-counter and prescription medicines only as told by your health care provider.  Anticoagulant medicines may cause side effects, including easy bruising and difficulty stopping bleeding. If you are prescribed an anticoagulant: ? Hold pressure over cuts for longer than usual. ? Tell your dentist and other health care providers that you are taking anticoagulants before you have any procedure that may cause bleeding. ? Avoid contact sports. ? Be extra careful when handling sharp objects. ? Use a soft toothbrush. Floss with waxed dental floss. ? Shave with an electric razor.  Wear a medical alert bracelet or carry a medical alert card that says you have had a PE.  Ask your health care provider when you may return to your normal activities.  Talk with your health care provider about any travel plans. It is important to make sure that you are still able to take your medicine while on trips.  Keep all follow-up visits as told by your health care provider. This is important. How is this prevented? Take these actions to lower your risk of developing another PE:  Exercise regularly. Take frequent walks. For at least 30 minutes every day, engage in: ? Activity that involves moving your arms and legs. ? Activity that encourages good blood flow through your body by increasing your heart rate.  While traveling, drink plenty of water and avoid drinking alcohol. Ask your health care provider if you should wear below-the-knee compression stockings.  Avoid sitting or lying in bed for long periods of time without moving your legs. Exercise your arms and legs every hour during long-distance travel (over 4 hours).  If you are hospitalized or have surgery, ask your health care provider about your risks and what treatments can help prevent blood clots.  Maintain a healthy weight. Ask your health care  provider what weight is healthy for you.  If you are a woman who is over age 35, avoid unnecessary use of medicines that contain estrogen, including birth control pills.  Do not use any products that contain nicotine or tobacco, such as cigarettes and e-cigarettes. This is especially important if you take estrogen medicines. If you need help quitting, ask your health care provider.  See your health care provider for regular checkups. This may include blood tests and ultrasound testing on your legs to check for new blood clots.  Contact a health care provider if:  You missed a dose of your blood thinner medicine. Get help right away if:  You have new or increased pain, swelling, warmth, or redness in an arm or leg.  You have numbness or tingling in an arm or leg.  You have shortness of breath while active or at rest.  You have chest pain.  You have a rapid or irregular heartbeat.  You feel light-headed or dizzy.  You cough up blood.  You have blood in your vomit, stool, or urine.  You have a fever.  You have abdomen (abdominal) pain.  You have a severe fall or head injury.  You have a   severe headache.  You have vision changes.  You cannot move your arms or legs.  You are confused or have memory loss.  You are bleeding for 10 minutes or more, even with strong pressure on the wound. These symptoms may represent a serious problem that is an emergency. Do not wait to see if the symptoms will go away. Get medical help right away. Call your local emergency services (911 in the [place] Do not drive yourself to the hospital. Summary  A pulmonary embolism (PE) is a sudden blockage or decrease of blood flow in one lung or both lungs. PE is a dangerous and life-threatening condition that needs to be treated right away.  Having deep vein thrombosis (DVT) or a history of DVT is the most common risk factor for PE.  Treatments for this condition usually include medicines to thin  your blood (anticoagulants) or medicines to break apart blood clots (thrombolytics).  If you are prescribed blood thinners, it is important to take the medicine every single day at the same time each day.  If you have signs of PE or DVT, call your local emergency services (911 in the [place] This information is not intended to replace advice given to you by your health care provider. Make sure you discuss any questions you have with your health care provider. Document Released: 02/11/2000 Document Revised: 03/18/2016 Document Reviewed: 03/18/2016 Elsevier Interactive Patient Education  2018 Elsevier Inc.  

## 2017-05-09 NOTE — Progress Notes (Addendum)
Assessment & Plan:  [name] was seen today for follow-up.  Diagnoses and all orders for this visit:  Polyarthritis with positive rheumatoid factor ([name]) -     Ambulatory referral to Rheumatology  Urinary incontinence, unspecified type -     Ambulatory referral to Urology  Other acute pulmonary embolism without acute cor pulmonale ([name]) -     Ambulatory referral to Hematology  Family history of colon cancer in father -     Ambulatory referral to Gastroenterology    Patient has been counseled on age-appropriate routine health concerns for screening and prevention. These are reviewed and up-to-date. Referrals have been placed accordingly. Immunizations are up-to-date or declined.    Subjective:   Chief Complaint  Patient presents with  . Follow-up    Patient is here for a follow-up.    HPI [name] 52 y.o. female presents to office today for follow up and for referrals. At her last appointment with me she was not able to be referred to specialists as she had not seen the financial counselor. At this time she has been approved for her referrals.   PE History of PE 01/26/2017. Will refer to Hematology for work up. Evaluate for clotting disorder. Continues on Eliquis at this time. She currently denies cough or hemoptysis.   PolyArthritis Referred to Rheumatology for further evaluation. Positive: RF, ANA, RNP. She also has severe DJD.   Urinary Incontinence Referred to Urology.  She has urge incontinence which requires her to wear protective undergarments.  Onset of symptoms was over 3 months ago after being diagnosed and treated for pyelonephritis in November 2018. She denies any hematuria, dysuria or bilateral flank pain.   Review of Systems  Constitutional: Negative for fever, malaise/fatigue and weight loss.  HENT: Negative.  Negative for nosebleeds.   Eyes: Negative.  Negative for blurred vision, double vision and photophobia.  Respiratory: Negative.  Negative  for cough and shortness of breath.   Cardiovascular: Negative.  Negative for chest pain, palpitations and leg swelling.  Gastrointestinal: Negative.  Negative for heartburn, nausea and vomiting.  Genitourinary: Positive for urgency. Negative for dysuria, flank pain, frequency and hematuria.       Urinary Incontinence  Musculoskeletal: Positive for back pain, joint pain, myalgias and neck pain.  Neurological: Positive for weakness. Negative for dizziness, focal weakness, seizures and headaches.  Psychiatric/Behavioral: Negative.  Negative for suicidal ideas.    Past Medical History:  Diagnosis Date  . Anemia   . Anxiety   . Asthma    hx as child - no prob as adult - no inhaler  . Blood transfusion 01/22/11   transfusion 2 units at [facility]  . Depression 08/2010   psych assessment  . Dyspnea   . Fibroid   . Headache(784.0)    rx for imitrex - last one jan  . Keloid     Past Surgical History:  Procedure Laterality Date  . ABDOMINAL HYSTERECTOMY    . HERNIA REPAIR  5427   umbilical  . SVD     x 1    Family History  Problem Relation Age of Onset  . Heart disease Mother   . Hypertension Mother   . Hypertension Father   . Hypertension Sister     Social History Reviewed with no changes to be made today.   Outpatient Medications Prior to Visit  Medication Sig Dispense Refill  . apixaban (ELIQUIS) 5 MG TABS tablet Take 1 tablet (5 mg total) by mouth 2 (two) times  daily. 60 tablet 3  . furosemide (LASIX) 20 MG tablet Take 1 tablet (20 mg total) by mouth daily. 30 tablet 1  . acetaminophen (TYLENOL) 500 MG tablet Take 1,500 mg by mouth every 6 (six) hours as needed for moderate pain.    . magnesium oxide (MAG-OX) 400 (241.3 Mg) MG tablet Take 1 tablet (400 mg total) by mouth daily. (Patient not taking: Reported on 03/09/2017) 30 tablet 0  . naproxen sodium (ANAPROX) 220 MG tablet Take 440 mg by mouth 2 (two) times daily as needed (pain).      No facility-administered medications  prior to visit.     Allergies  Allergen Reactions  . Augmentin [Amoxicillin-Pot Clavulanate] Hives and Itching    Did PCN reaction causing immediate rash, facial/tongue/throat swelling, SOB or lightheadedness with hypotension: no Did PCN reaction causing severe rash involving mucus membranes or skin necrosis: no Has patient had a PCN reaction that required hospitalization: in hospital Has patient had a PCN reaction occurring within the last 10 years: no If all of the above answers are "NO", then may proceed with Cephalosporin use.  Pt reports no recollection of any reactions when taking penicillin in past  . Latex Hives    Local reaction (welts). Patient denies any wheezing or other reaction with latex exposure       Objective:    BP 121/81 (BP Location: Left Arm, Patient Position: Sitting, Cuff Size: Large)   Pulse (!) 101   Temp 98.2 F (36.8 C) (Oral)   LMP 01/31/2011   SpO2 99%  Wt Readings from Last 3 Encounters:  03/26/17 279 lb 9.6 oz (126.8 kg)  01/25/17 285 lb (129.3 kg)  12/30/16 285 lb (129.3 kg)    Physical Exam  Constitutional: She is oriented to person, place, and time. She appears well-developed and well-nourished. She is cooperative.  HENT:  Head: Normocephalic and atraumatic.  Eyes: EOM are normal.  Neck: Normal range of motion.  Cardiovascular: Normal rate, regular rhythm and normal heart sounds. Exam reveals no gallop and no friction rub.  No murmur heard. Pulmonary/Chest: Effort normal and breath sounds normal. No tachypnea. No respiratory distress. She has no decreased breath sounds. She has no wheezes. She has no rhonchi. She has no rales. She exhibits no tenderness.  Abdominal: Soft. Bowel sounds are normal.  Musculoskeletal: Normal range of motion. She exhibits no edema.  Neurological: She is alert and oriented to person, place, and time. Gait (using a wheelchair today for mobility) abnormal. Coordination normal.  Skin: Skin is warm and dry.    Psychiatric: She has a normal mood and affect. Her behavior is normal. Judgment and thought content normal.  Nursing note and vitals reviewed.     Patient has been counseled extensively about nutrition and exercise as well as the importance of adherence with medications and regular follow-up. The patient was given clear instructions to go to ER or return to medical center if symptoms don't improve, worsen or new problems develop. The patient verbalized understanding.   Follow-up: Return in about 3 months (around 08/09/2017).   Gildardo Pounds, FNP-BC [facility] [place], [place]   05/10/2017, 8:47 PM

## 2017-05-09 NOTE — BH Specialist Note (Signed)
LCSWA introduced self and explained role at [facility].   Pt shared that she is interested in applying for disability. She was recently approved for the [employer] discount and orange card. LCSWA provided information on the process of applying for disability, in addition, to contact information. No additional concerns noted.

## 2017-05-10 ENCOUNTER — Encounter: Payer: Self-pay | Admitting: Nurse Practitioner

## 2017-05-11 ENCOUNTER — Encounter: Payer: Self-pay | Admitting: Hematology

## 2017-05-11 ENCOUNTER — Telehealth: Payer: Self-pay | Admitting: Hematology

## 2017-05-11 NOTE — Telephone Encounter (Signed)
Appt has been scheduled for the pt to see Dr. Irene Limbo on 4/3 at 1pm. Pt aware to arrive 30 minutes early. Letter mailed.

## 2017-05-18 ENCOUNTER — Encounter: Payer: Self-pay | Admitting: Gastroenterology

## 2017-05-21 ENCOUNTER — Telehealth: Payer: Self-pay | Admitting: Licensed Clinical Social Worker

## 2017-05-21 NOTE — Telephone Encounter (Signed)
LCSWA contacted pt to follow up on disability assistance. Pt shared that she has been unable to visit the disability office due to mobility and transportation barriers. She currently does not have access to Internet.   Pt stated that she is relocating to mother's residence this month where she will receive stronger support from family. Pt was appreciative for assistance and agreed to contact LCSWA and/or PCP with any additional concerns.

## 2017-05-22 ENCOUNTER — Other Ambulatory Visit: Payer: Self-pay | Admitting: Nurse Practitioner

## 2017-05-22 MED ORDER — APIXABAN 5 MG PO TABS: 5.0000 mg | ORAL_TABLET | Freq: Two times a day (BID) | ORAL | 3 refills | Status: DC

## 2017-05-23 ENCOUNTER — Other Ambulatory Visit: Payer: Self-pay | Admitting: Nurse Practitioner

## 2017-05-23 MED FILL — !ELIQUIS 5 MG TABLET: 5 | 30 days supply | Qty: 60 | Fill #0

## 2017-05-24 ENCOUNTER — Ambulatory Visit
Admission: RE | Admit: 2017-05-24 | Discharge: 2017-05-24 | Disposition: A | Discharge status: Home / Self Care | Payer: No Typology Code available for payment source | Source: Ambulatory Visit | Attending: Obstetrics and Gynecology | Admitting: Obstetrics and Gynecology

## 2017-05-24 ENCOUNTER — Ambulatory Visit (HOSPITAL_COMMUNITY)
Admission: RE | Admit: 2017-05-24 | Discharge: 2017-05-24 | Disposition: A | Discharge status: Home / Self Care | Payer: Self-pay | Source: Ambulatory Visit | Attending: Obstetrics and Gynecology | Admitting: Obstetrics and Gynecology

## 2017-05-24 ENCOUNTER — Encounter (HOSPITAL_COMMUNITY): Payer: Self-pay

## 2017-05-24 ENCOUNTER — Other Ambulatory Visit (HOSPITAL_COMMUNITY): Payer: Self-pay | Admitting: Obstetrics and Gynecology

## 2017-05-24 VITALS — BP 122/78 | Ht 72.0 in

## 2017-05-24 DIAGNOSIS — R2231 Localized swelling, mass and lump, right upper limb: Secondary | ICD-10-CM

## 2017-05-24 DIAGNOSIS — Z1239 Encounter for other screening for malignant neoplasm of breast: Secondary | ICD-10-CM

## 2017-05-24 DIAGNOSIS — R2232 Localized swelling, mass and lump, left upper limb: Secondary | ICD-10-CM

## 2017-05-24 NOTE — Patient Instructions (Signed)
Explained breast self awareness with [name]. Patient did not need a Pap smear today due to last Pap smear due to her history of a hysterectomy for benign reasons. Let patient know that she doesn't need any further Pap smears due to her history of a hysterectomy for benign reasons. Referred patient to the [facility] for a diagnostic mammogram and bilateral breast ultrasounds. Appointment scheduled for Thursday, May 24, 2017 at 1320. [name] verbalized understanding.  Benz Vandenberghe, Arvil Chaco, RN 2:58 PM

## 2017-05-24 NOTE — Progress Notes (Addendum)
Patient referred to [facility] due to an enlarge axillary lymph node on CT scan 01/26/2017. Patient complained of left axillary pain x 2 weeks that is constant. Patient rates the pain at a 4 out of 10.  Pap Smear: Pap smear not completed today. Last Pap smear was around 15 years ago and normal per patient. Per patient has a history of an abnormal Pap smear around 1990 that a colposcopy was completed for follow-up. Patient states all Pap smears have been normal since colposcopy. Patient has a history of a hysterectomy 02/22/2011 due to AUB. Patient no longer needs Pap smears due to her history of a hysterectomy for benign reasons per BCCCP and ACOG guidelines. No Pap smear results but hysterectomy results are in are in Epic.  Physical exam: Breasts Left breast slightly larger that per patient is normal for her. No skin abnormalities bilateral breasts. No nipple retraction bilateral breasts. No nipple discharge bilateral breasts. Palpated a lump in left axilla at 1 o'clock 17 cm from the nipple and right axilla at 11 o'clock 19 cm from the nipple. No lumps palpated bilateral breasts. Complaints of tenderness when palpated bilateral axillary lumps. Referred patient to the [facility] for a diagnostic mammogram and bilateral breast ultrasounds. Appointment scheduled for Thursday, May 24, 2017 at 1320.        Pelvic/Bimanual No Pap smear completed today since patient has history of a hysterectomy for benign reasons.  Pap smear not indicated per BCCCP guidelines.   Smoking History: Patient has never smoked.  Patient Navigation: Patient education provided. Access to services provided for patient through BCCCP program.   Breast and Cervical Cancer Risk Assessment: Patient has no family history of breast cancer, known genetic mutations, or radiation treatment to the chest before age 48. Per patient has a history of cervical dysplasia. Patient has no history of being immunocompromised or DES  exposure in-utero.

## 2017-05-24 NOTE — Addendum Note (Signed)
Encounter addended by: Loletta Parish, RN on: 05/24/2017 3:24 PM  Actions taken: Sign clinical note

## 2017-05-29 ENCOUNTER — Inpatient Hospital Stay: Admission: RE | Admit: 2017-05-29 | Payer: No Typology Code available for payment source | Source: Ambulatory Visit

## 2017-05-29 ENCOUNTER — Encounter (HOSPITAL_COMMUNITY): Payer: Self-pay | Admitting: *Deleted

## 2017-05-30 ENCOUNTER — Telehealth: Payer: Self-pay

## 2017-05-30 ENCOUNTER — Other Ambulatory Visit: Payer: No Typology Code available for payment source

## 2017-05-30 ENCOUNTER — Encounter: Payer: Self-pay | Admitting: Hematology

## 2017-05-30 NOTE — Telephone Encounter (Signed)
Spoke with pt daughter regarding missed appt today. Daughter stated that they tried multiple times to call and reschedule because her mother's legs are swollen today. Discussed fluid overload and how important it is to be aware of changes. Pt currently denies SOB, dizziness, oliguria, leg pain or chest discomfort. Encouraged pt to note any of these changes in status, as well as, mental changes. Fluid overload can be extremely serious, and the pt should seek medical care if she continues to have increased leg swelling or changes in the above symptoms. Scheduling message sent to call pt and reschedule. Pt daughter verbalized understanding.

## 2017-06-01 ENCOUNTER — Other Ambulatory Visit (HOSPITAL_COMMUNITY)
Admission: RE | Admit: 2017-06-01 | Discharge: 2017-06-01 | Disposition: A | Discharge status: Home / Self Care | Payer: No Typology Code available for payment source | Source: Ambulatory Visit | Attending: Obstetrics and Gynecology | Admitting: Obstetrics and Gynecology

## 2017-06-01 ENCOUNTER — Ambulatory Visit
Admission: RE | Admit: 2017-06-01 | Discharge: 2017-06-01 | Disposition: A | Discharge status: Home / Self Care | Payer: No Typology Code available for payment source | Source: Ambulatory Visit | Attending: Obstetrics and Gynecology | Admitting: Obstetrics and Gynecology

## 2017-06-01 DIAGNOSIS — R2232 Localized swelling, mass and lump, left upper limb: Secondary | ICD-10-CM

## 2017-06-14 ENCOUNTER — Telehealth: Payer: Self-pay

## 2017-06-14 ENCOUNTER — Inpatient Hospital Stay: Payer: Self-pay

## 2017-06-14 ENCOUNTER — Inpatient Hospital Stay: Payer: Self-pay | Attending: Hematology and Oncology | Admitting: Hematology and Oncology

## 2017-06-14 ENCOUNTER — Encounter: Payer: Self-pay | Admitting: Hematology and Oncology

## 2017-06-14 VITALS — BP 119/73 | HR 119 | Temp 99.1°F | Resp 17 | Ht 72.0 in

## 2017-06-14 DIAGNOSIS — Z800 Family history of malignant neoplasm of digestive organs: Secondary | ICD-10-CM

## 2017-06-14 DIAGNOSIS — I2699 Other pulmonary embolism without acute cor pulmonale: Secondary | ICD-10-CM

## 2017-06-14 DIAGNOSIS — R590 Localized enlarged lymph nodes: Secondary | ICD-10-CM

## 2017-06-14 DIAGNOSIS — Z7901 Long term (current) use of anticoagulants: Secondary | ICD-10-CM

## 2017-06-14 DIAGNOSIS — L049 Acute lymphadenitis, unspecified: Secondary | ICD-10-CM

## 2017-06-14 LAB — CBC WITH DIFFERENTIAL (CANCER CENTER ONLY)
Basophils Absolute: 0 10*3/uL (ref 0.0–0.1)
Basophils Relative: 0 %
Eosinophils Absolute: 0.2 10*3/uL (ref 0.0–0.5)
Eosinophils Relative: 2 %
HCT: 37.3 % (ref 34.8–46.6)
Hemoglobin: 12.2 g/dL (ref 11.6–15.9)
Lymphocytes Relative: 23 %
Lymphs Abs: 1.8 10*3/uL (ref 0.9–3.3)
MCH: 28.8 pg (ref 25.1–34.0)
MCHC: 32.7 g/dL (ref 31.5–36.0)
MCV: 88 fL (ref 79.5–101.0)
Monocytes Absolute: 0.5 10*3/uL (ref 0.1–0.9)
Monocytes Relative: 7 %
Neutro Abs: 5.3 10*3/uL (ref 1.5–6.5)
Neutrophils Relative %: 68 %
Platelet Count: 340 10*3/uL (ref 145–400)
RBC: 4.24 MIL/uL (ref 3.70–5.45)
RDW: 14.8 % — ABNORMAL HIGH (ref 11.2–14.5)
WBC Count: 7.8 10*3/uL (ref 3.9–10.3)

## 2017-06-14 LAB — CMP (CANCER CENTER ONLY)
ALT: 8 U/L (ref 0–55)
AST: 10 U/L (ref 5–34)
Albumin: 4 g/dL (ref 3.5–5.0)
Alkaline Phosphatase: 70 U/L (ref 40–150)
Anion gap: 6 (ref 3–11)
BUN: 15 mg/dL (ref 7–26)
CO2: 25 mmol/L (ref 22–29)
Calcium: 10.7 mg/dL — ABNORMAL HIGH (ref 8.4–10.4)
Chloride: 106 mmol/L (ref 98–109)
Creatinine: 1.05 mg/dL (ref 0.60–1.10)
GFR, Est AFR Am: >60 mL/min (ref >60)
GFR, Estimated: >60 mL/min (ref >60)
Glucose, Bld: 102 mg/dL (ref 70–140)
Potassium: 3.7 mmol/L (ref 3.5–5.1)
Sodium: 137 mmol/L (ref 136–145)
Total Bilirubin: 0.9 mg/dL (ref 0.2–1.2)
Total Protein: 8.8 g/dL — ABNORMAL HIGH (ref 6.4–8.3)

## 2017-06-14 NOTE — Telephone Encounter (Signed)
Printed avs and calender of upcoming appointment.per 4/18 los, also gave contrast

## 2017-06-17 LAB — LUPUS ANTICOAGULANT PANEL
DRVVT: 78.4 s — ABNORMAL HIGH (ref 0.0–47.0)
PTT Lupus Anticoagulant: 38.5 s (ref 0.0–51.9)

## 2017-06-17 LAB — DRVVT CONFIRM: dRVVT Confirm: 1.2 ratio (ref 0.8–1.2)

## 2017-06-17 LAB — DRVVT MIX: dRVVT Mix: 53.2 s — ABNORMAL HIGH (ref 0.0–47.0)

## 2017-06-26 MED FILL — !ELIQUIS 5 MG TABLET: 5 | 30 days supply | Qty: 60 | Fill #1

## 2017-06-29 ENCOUNTER — Encounter (HOSPITAL_COMMUNITY): Payer: Self-pay

## 2017-06-29 ENCOUNTER — Ambulatory Visit (HOSPITAL_COMMUNITY)
Admission: RE | Admit: 2017-06-29 | Discharge: 2017-06-29 | Disposition: A | Discharge status: Home / Self Care | Payer: Self-pay | Source: Ambulatory Visit | Attending: Hematology and Oncology | Admitting: Hematology and Oncology

## 2017-06-29 DIAGNOSIS — R590 Localized enlarged lymph nodes: Secondary | ICD-10-CM | POA: Insufficient documentation

## 2017-06-29 DIAGNOSIS — L049 Acute lymphadenitis, unspecified: Secondary | ICD-10-CM

## 2017-06-29 DIAGNOSIS — I313 Pericardial effusion (noninflammatory): Secondary | ICD-10-CM | POA: Insufficient documentation

## 2017-06-29 DIAGNOSIS — M4185 Other forms of scoliosis, thoracolumbar region: Secondary | ICD-10-CM | POA: Insufficient documentation

## 2017-06-29 DIAGNOSIS — L910 Hypertrophic scar: Secondary | ICD-10-CM | POA: Insufficient documentation

## 2017-06-29 MED ORDER — IOHEXOL 300 MG/ML  SOLN
100.0000 mL | Freq: Once | INTRAMUSCULAR | Status: AC | PRN
  Administered 2017-06-29: 100 mL via INTRAVENOUS

## 2017-07-02 NOTE — Assessment & Plan Note (Signed)
52 y.o. female with severely diminished mobility likely provoking the venous thromboembolism event that has occurred in November 2018.  Patient has underlying inflammatory condition that may have contributed to the prothrombotic state.  Appears to be tolerating current anticoagulation with apixaban without difficulties.  I think patient needs to be evaluated for possible presence of antiphospholipid antibody syndrome.  Due to her persistent decreased mobility, patient should remain on anticoagulation indefinitely.  At least 6 months of anticoagulation is advisable before interrupting it for any significant procedures such as knee surgery that is being considered.  Lymphadenopathy discovered during evaluation for pulmonary embolism appears to be nonmalignant in nature and likely is related to her underlying inflammatory-connective tissue condition.  Patient is at risk risk of developing lymphoproliferative or a myeloproliferative process in the future.  Plan: -Recommend indefinite anticoagulation. - Labs today to assess for antiphospholipid antibody syndrome. - Return to our clinic in 3 months with CT of the neck/chest/abdomen/pelvis to continue monitoring with adenopathy.  Biopsies will be repeated only on significant change in the lymph node appearance.

## 2017-07-02 NOTE — Progress Notes (Signed)
[facility] New Visit:  Assessment: PE (pulmonary thromboembolism) ([place]) 52 y.o. female with severely diminished mobility likely provoking the venous thromboembolism event that has occurred in November 2018.  Patient has underlying inflammatory condition that may have contributed to the prothrombotic state.  Appears to be tolerating current anticoagulation with apixaban without difficulties.  I think patient needs to be evaluated for possible presence of antiphospholipid antibody syndrome.  Due to her persistent decreased mobility, patient should remain on anticoagulation indefinitely.  At least 6 months of anticoagulation is advisable before interrupting it for any significant procedures such as knee surgery that is being considered.  Lymphadenopathy discovered during evaluation for pulmonary embolism appears to be nonmalignant in nature and likely is related to her underlying inflammatory-connective tissue condition.  Patient is at risk risk of developing lymphoproliferative or a myeloproliferative process in the future.  Plan: -Recommend indefinite anticoagulation. - Labs today to assess for antiphospholipid antibody syndrome. - Return to our clinic in 3 months with CT of the neck/chest/abdomen/pelvis to continue monitoring with adenopathy.  Biopsies will be repeated only on significant change in the lymph node appearance.    Voice recognition software was used and creation of this note. Despite my best effort at editing the text, some misspelling/errors may have occurred. Orders Placed This Encounter  Procedures  . CT Soft Tissue Neck W Contrast    Standing Status:   Future    Number of Occurrences:   1    Standing Expiration Date:   06/14/2018    Order Specific Question:   If indicated for the ordered procedure, I authorize the administration of contrast media per Radiology protocol    Answer:   Yes    Order Specific Question:   Is patient pregnant?    Answer:   No     Order Specific Question:   Preferred imaging location?    Answer:   [facility]    Order Specific Question:   Radiology Contrast Protocol - do NOT remove file path    Answer:   \\[email or web address]    Order Specific Question:   Reason for Exam additional comments    Answer:   Diffuse reactive lymphadenopathy, please eval for progression  . CT Abdomen Pelvis W Contrast    Standing Status:   Future    Number of Occurrences:   1    Standing Expiration Date:   06/14/2018    Order Specific Question:   If indicated for the ordered procedure, I authorize the administration of contrast media per Radiology protocol    Answer:   Yes    Order Specific Question:   Is patient pregnant?    Answer:   No    Order Specific Question:   Preferred imaging location?    Answer:   [facility]    Order Specific Question:   Is Oral Contrast requested for this exam?    Answer:   Per Radiology protocol    Order Specific Question:   Radiology Contrast Protocol - do NOT remove file path    Answer:   \\[email or web address]    Order Specific Question:   Reason for Exam additional comments    Answer:   Diffuse reactive lymphadenopathy, please eval for progression  . CT Chest W Contrast    Standing Status:   Future    Number of Occurrences:   1    Standing Expiration Date:   06/14/2018    Order Specific Question:   If indicated for  the ordered procedure, I authorize the administration of contrast media per Radiology protocol    Answer:   Yes    Order Specific Question:   Is patient pregnant?    Answer:   No    Order Specific Question:   Preferred imaging location?    Answer:   [facility]    Order Specific Question:   Radiology Contrast Protocol - do NOT remove file path    Answer:   \\[email or web address]    Order Specific Question:   Reason for Exam additional comments    Answer:   Diffuse reactive lymphadenopathy,  please eval for progression  . CBC with Differential (Cancer Center Only)    Standing Status:   Future    Number of Occurrences:   1    Standing Expiration Date:   06/15/2018  . CMP ([facility] only)    Standing Status:   Future    Number of Occurrences:   1    Standing Expiration Date:   06/15/2018  . Lupus anticoagulant panel*    Standing Status:   Future    Number of Occurrences:   1    Standing Expiration Date:   06/14/2018    All questions were answered.  . The patient knows to call the clinic with any problems, questions or concerns.  This note was electronically signed.    History of Presenting Illness [name] 52 y.o. presenting to the [facility] for recommendations regarding anticoagulation following development of pulmonary embolism in November 2018 and also for discovery of axillary lymphadenopathy, referred by Gildardo Pounds, N patient's past medical history significant forP.  Polyarteritis with positive rheumatoid factor, AMA, and RNP.  Additionally, patient has chronic urinary incontinence, morbid obesity, asthma, fibroids, depression/anxiety.  Family history significant for father with colon cancer.  Patient is currently receiving apixaban therapy which she appears to be tolerating well without breakthrough bleeding and no evidence of recurrent clots.  Presenting symptoms as outlined below have resolved completely.  At the present time, patient denies fevers, chills, night sweats.  Denies any unexpected weight loss or weight gain.  Energy level and activity tolerance remains unchanged.  Patient denies shortness of breath, chest pain, or cough.  No nausea, vomiting, abdominal pain, diarrhea, or constipation.  No dysuria or hematuria.  Oncological/hematological History: --Event #1, Nov 2018: Patient presented to the emergency room with pleuritic chest pain, fever, shortness of breath and urinary incontinence with shortness of breath and chest pain progressing  over several days.  This was associated with bilateral calf pain and development of sacral decubitus ulcers due to decreased mobility.  --Labs, 01/25/17: d-dimer 8.43  --ECHO, 01/26/17: LVEF 65-70%, normal right ventricular cavity size, normal wall thickness, and normal systolic function.  Normal IVC caliber.  --Doppler US BL LE, 01/26/17: No evidence of deep vein thrombosis.  --CTA chest, 01/26/17: No evidence of hilar or mediastinal lymphadenopathy.  Anterior right pulmonary lobe consolidation.  Positive for pulmonary emboli in the Lt lower lobe.  Subsegmental and lingular arteries pathological right axillary lymphadenopathy. --Mammogram BL, 05/24/17: No evidence of malignancy, but prominent axillary lymph nodes identified. --Rt Axillary LN Bx, 06/01/17: Reactive lymphoid hyperplasia.  Flow cytometry negative for evidence of monoclonal lymphoproliferative process.  Medical History: Past Medical History:  Diagnosis Date  . Anemia   . Anxiety   . Asthma    hx as child - no prob as adult - no inhaler  . Blood transfusion 01/22/11   transfusion 2 units at  [facility]  . Depression 08/2010   psych assessment  . Dyspnea   . Fibroid   . Headache(784.0)    rx for imitrex - last one jan  . Keloid     Surgical History: Past Surgical History:  Procedure Laterality Date  . ABDOMINAL HYSTERECTOMY    . HERNIA REPAIR  5784   umbilical  . SVD     x 1    Family History: Family History  Problem Relation Age of Onset  . Heart disease Mother   . Hypertension Mother   . Hypertension Father   . Hypertension Sister     Social History: Social History   Socioeconomic History  . Marital status: Single    Spouse name: Not on file  . Number of children: 1  . Years of education: Not on file  . Highest education level: Not on file  Occupational History  . Occupation: Interior and spatial designer: [employer]  Social Needs  . Financial resource strain: Not on file  . Food insecurity:    Worry: Not on  file    Inability: Not on file  . Transportation needs:    Medical: Not on file    Non-medical: Not on file  Tobacco Use  . Smoking status: Never Smoker  . Smokeless tobacco: Never Used  Substance and Sexual Activity  . Alcohol use: No  . Drug use: No  . Sexual activity: Not Currently    Birth control/protection: None, Surgical  Lifestyle  . Physical activity:    Days per week: 7 days    Minutes per session: 30 min  . Stress: Only a little  Relationships  . Social connections:    Talks on phone: More than three times a week    Gets together: Twice a week    Attends religious service: More than 4 times per year    Active member of club or organization: No    Attends meetings of clubs or organizations: Never    Relationship status: Never married  . Intimate partner violence:    Fear of current or ex partner: No    Emotionally abused: No    Physically abused: No    Forced sexual activity: No  Other Topics Concern  . Not on file  Social History Narrative  . Not on file    Allergies: Allergies  Allergen Reactions  . Augmentin [Amoxicillin-Pot Clavulanate] Hives and Itching    Did PCN reaction causing immediate rash, facial/tongue/throat swelling, SOB or lightheadedness with hypotension: no Did PCN reaction causing severe rash involving mucus membranes or skin necrosis: no Has patient had a PCN reaction that required hospitalization: in hospital Has patient had a PCN reaction occurring within the last 10 years: no If all of the above answers are "NO", then may proceed with Cephalosporin use.  Pt reports no recollection of any reactions when taking penicillin in past  . Latex Hives    Local reaction (welts). Patient denies any wheezing or other reaction with latex exposure    Medications:  Current Outpatient Medications  Medication Sig Dispense Refill  . acetaminophen (TYLENOL) 500 MG tablet Take 1,500 mg by mouth every 6 (six) hours as needed for moderate pain.    Marland Kitchen  apixaban (ELIQUIS) 5 MG TABS tablet Take 1 tablet (5 mg total) by mouth 2 (two) times daily. 60 tablet 3  . furosemide (LASIX) 20 MG tablet Take 1 tablet (20 mg total) by mouth daily. 30 tablet 1  . magnesium oxide (MAG-OX)  400 (241.3 Mg) MG tablet Take 1 tablet (400 mg total) by mouth daily. (Patient not taking: Reported on 03/09/2017) 30 tablet 0   No current facility-administered medications for this visit.     Review of Systems: Review of Systems  All other systems reviewed and are negative.    PHYSICAL EXAMINATION Blood pressure 119/73, pulse (!) 119, temperature 99.1 F (37.3 C), temperature source Oral, resp. rate 17, height 6' (1.829 m), last menstrual period 01/31/2011, SpO2 97 %.  ECOG PERFORMANCE STATUS: 1 - Symptomatic but completely ambulatory  Physical Exam  Constitutional: She is oriented to person, place, and time. She appears well-developed and well-nourished. No distress.  HENT:  Head: Normocephalic and atraumatic.  Mouth/Throat: Oropharynx is clear and moist. No oropharyngeal exudate.  Eyes: Pupils are equal, round, and reactive to light. Conjunctivae and EOM are normal. No scleral icterus.  Neck: No thyromegaly present.  Cardiovascular: Normal rate, regular rhythm, normal heart sounds and intact distal pulses. Exam reveals no gallop and no friction rub.  No murmur heard. Pulmonary/Chest: Breath sounds normal. No stridor. No respiratory distress. She has no wheezes. She has no rales.  Abdominal: Soft. Bowel sounds are normal. She exhibits no distension and no mass. There is no tenderness. There is no guarding.  Musculoskeletal: She exhibits no edema.  Lymphadenopathy:    She has no cervical adenopathy.  Neurological: She is alert and oriented to person, place, and time. She displays normal reflexes. No cranial nerve deficit or sensory deficit.  Skin: Skin is warm and dry. No rash noted. She is not diaphoretic. No erythema. No pallor.     LABORATORY DATA: I have  personally reviewed the data as listed: Clinical Support on 06/14/2017  Component Date Value Ref Range Status  . PTT Lupus Anticoagulant 06/14/2017 38.5  0.0 - 51.9 sec Final  . DRVVT 06/14/2017 78.4* 0.0 - 47.0 sec Final  . Lupus Anticoag Interp 06/14/2017 Comment:   Corrected   Comment: (NOTE) No lupus anticoagulant was detected. These results are consistent with specific inhibitors to one or more common pathway factors (X, V, II or fibrinogen). Performed At: [facility] [place], [place] [id number] Rush Farmer MD [id number] Performed at [facility], [place] [address] [place], [place] [address]   . Sodium 06/14/2017 137  136 - 145 mmol/L Final  . Potassium 06/14/2017 3.7  3.5 - 5.1 mmol/L Final  . Chloride 06/14/2017 106  98 - 109 mmol/L Final  . CO2 06/14/2017 25  22 - 29 mmol/L Final  . Glucose, Bld 06/14/2017 102  70 - 140 mg/dL Final  . BUN 06/14/2017 15  7 - 26 mg/dL Final  . Creatinine 06/14/2017 1.05  0.60 - 1.10 mg/dL Final  . Calcium 06/14/2017 10.7* 8.4 - 10.4 mg/dL Final  . Total Protein 06/14/2017 8.8* 6.4 - 8.3 g/dL Final  . Albumin 06/14/2017 4.0  3.5 - 5.0 g/dL Final  . AST 06/14/2017 10  5 - 34 U/L Final  . ALT 06/14/2017 8  0 - 55 U/L Final  . Alkaline Phosphatase 06/14/2017 70  40 - 150 U/L Final  . Total Bilirubin 06/14/2017 0.9  0.2 - 1.2 mg/dL Final  . GFR, Est Non Af Am 06/14/2017 >60  >60 mL/min Final  . GFR, Est AFR Am 06/14/2017 >60  >60 mL/min Final   Comment: (NOTE) The eGFR has been calculated using the CKD EPI equation. This calculation has not been validated in all clinical situations. eGFR's persistently <60 mL/min  signify possible Chronic Kidney Disease.   Georgiann Hahn gap 06/14/2017 6  3 - 11 Final   Performed at [facility], [place] [address] [place], [place] [address]  . WBC Count 06/14/2017 7.8  3.9 - 10.3 K/uL Final  . RBC 06/14/2017 4.24  3.70 - 5.45 MIL/uL Final   . Hemoglobin 06/14/2017 12.2  11.6 - 15.9 g/dL Final  . HCT 06/14/2017 37.3  34.8 - 46.6 % Final  . MCV 06/14/2017 88.0  79.5 - 101.0 fL Final  . MCH 06/14/2017 28.8  25.1 - 34.0 pg Final  . MCHC 06/14/2017 32.7  31.5 - 36.0 g/dL Final  . RDW 06/14/2017 14.8* 11.2 - 14.5 % Final  . Platelet Count 06/14/2017 340  145 - 400 K/uL Final  . Neutrophils Relative % 06/14/2017 68  % Final  . Neutro Abs 06/14/2017 5.3  1.5 - 6.5 K/uL Final  . Lymphocytes Relative 06/14/2017 23  % Final  . Lymphs Abs 06/14/2017 1.8  0.9 - 3.3 K/uL Final  . Monocytes Relative 06/14/2017 7  % Final  . Monocytes Absolute 06/14/2017 0.5  0.1 - 0.9 K/uL Final  . Eosinophils Relative 06/14/2017 2  % Final  . Eosinophils Absolute 06/14/2017 0.2  0.0 - 0.5 K/uL Final  . Basophils Relative 06/14/2017 0  % Final  . Basophils Absolute 06/14/2017 0.0  0.0 - 0.1 K/uL Final   Performed at [facility], [place] [address] [place], [place] [address]  . dRVVT Mix 06/14/2017 53.2* 0.0 - 47.0 sec Final   Comment: (NOTE) Performed At: [facility] [place], [place] [id number] Rush Farmer MD KZ:[id number] Performed at [facility], [place] [address], [place], [place] [address]   . dRVVT Confirm 06/14/2017 1.2  0.8 - 1.2 ratio Final   Comment: (NOTE) Performed At: [facility], [place] [id number] Rush Farmer MD JS:[id number] Performed at [facility], [place] [address], [place], [place] [address]          Ardath Sax, MD

## 2017-07-24 MED FILL — FUROSEMIDE 20 MG TABLET: 20 | 30 days supply | Qty: 30 | Fill #1

## 2017-07-24 MED FILL — !ELIQUIS 5 MG TABLET: 5 | 30 days supply | Qty: 60 | Fill #2

## 2017-08-01 ENCOUNTER — Telehealth: Payer: Self-pay

## 2017-08-01 ENCOUNTER — Ambulatory Visit (INDEPENDENT_AMBULATORY_CARE_PROVIDER_SITE_OTHER): Payer: Self-pay | Admitting: Nurse Practitioner

## 2017-08-01 ENCOUNTER — Encounter: Payer: Self-pay | Admitting: Nurse Practitioner

## 2017-08-01 DIAGNOSIS — Z800 Family history of malignant neoplasm of digestive organs: Secondary | ICD-10-CM | POA: Insufficient documentation

## 2017-08-01 DIAGNOSIS — R69 Illness, unspecified: Secondary | ICD-10-CM | POA: Insufficient documentation

## 2017-08-01 NOTE — Telephone Encounter (Signed)
Dr. Raul Del,   This mutual patient was seen in our office today by Walden Field, NP.  He would like to schedule the patient for a colonoscopy in the very near future with Dr. Oneida Alar.  Please advise if it is Ok to [facility] the Xarelto for 48 hours prior to procedure.  Thanks so much!  Embry Manrique H. Nicole Kindred, LPN

## 2017-08-01 NOTE — Progress Notes (Signed)
CC'D TO PCP °

## 2017-08-01 NOTE — Patient Instructions (Signed)
1. We will tentatively schedule your colonoscopy for you. 2. We will contact Dr. Raul Del and ensure it is okay to hold your blood thinner prior to your procedure. 3. We will call you to confirm scheduling once we hear back from Dr. Raul Del. 4. Further recommendations will be made after your colonoscopy. 5. Return for follow-up based on recommendations made after your colonoscopy. 6. Call us if you have any questions or concerns.  At [facility] we value your feedback. You may receive a survey about your visit today. Please share your experience as we strive to create trusting relationships with our patients to provide genuine, compassionate, quality care.  It was great to see you today!  I hope you have a wonderful summer!!

## 2017-08-01 NOTE — Assessment & Plan Note (Signed)
Patient is currently on Eliquis for pulmonary embolism diagnosed in November 2018.  We will contact primary care who manages her Eliquis for permission to hold Eliquis for 48 hours prior to procedure.  We will tentatively schedule her procedure at this time.  We will follow-up and confirm over the phone when we are able to hold her medication.  Follow-up based on post procedure recommendations.

## 2017-08-01 NOTE — Progress Notes (Addendum)
REVIEWED-NO ADDITIONAL RECOMMENDATIONS.  Primary Care Physician:  Gildardo Pounds, NP Primary Gastroenterologist:  Dr. Oneida Alar  Chief Complaint  Patient presents with  . Consult    TCS. FH colon cancer: father, paternal uncle, paternal GM    HPI:   [name] is a 52 y.o. female who presents on referral from primary care for colonoscopy.  Referral indicates family history of colon cancer in her father.  She was deferred to office visit due to family history as well as on Eliquis.  No history of colonoscopy in our system.  Today she states she had a blood clot in her lung in December, still on blood thinners; This [facility]. She also needs a total knee replacement (left knee). Denies abdominal pain, N/V, hematochezia, melena, fever, chills, unintentional weight loss. Has had softer stools as of 12/2016. Denies chest pain, dyspnea, dizziness, lightheadedness, syncope, near syncope. Denies any other upper or lower GI symptoms.  She has been on blood thinner for about 6 months. Recent CT chest shows stable lymphadenopathy, no mention of PE.  Past Medical History:  Diagnosis Date  . Anemia   . Anxiety   . Asthma    hx as child - no prob as adult - no inhaler  . Blood transfusion 01/22/11   transfusion 2 units at [facility]  . Depression 08/2010   psych assessment  . Dyspnea   . Fibroid   . Headache(784.0)    rx for imitrex - last one jan  . Keloid   . Pulmonary embolism [facility])     Past Surgical History:  Procedure Laterality Date  . ABDOMINAL HYSTERECTOMY    . HERNIA REPAIR  1937   umbilical  . SVD     Spontaneous vaginal delivery; x 1    Current Outpatient Medications  Medication Sig Dispense Refill  . acetaminophen (TYLENOL) 500 MG tablet Take 1,500 mg by mouth every 6 (six) hours as needed for moderate pain.    Marland Kitchen apixaban (ELIQUIS) 5 MG TABS tablet Take 1 tablet (5 mg total) by mouth 2 (two) times daily. 60 tablet 3    . furosemide (LASIX) 20 MG tablet Take 1 tablet (20 mg total) by mouth daily. 30 tablet 1   No current facility-administered medications for this visit.     Allergies as of 08/01/2017 - Review Complete 08/01/2017  Allergen Reaction Noted  . Augmentin [amoxicillin-pot clavulanate] Hives and Itching 11/22/2011  . Latex Hives 02/22/2011    Family History  Problem Relation Age of Onset  . Heart disease Mother   . Hypertension Mother   . Hypertension Father   . Colon cancer Father 22       Passed away 26 yrs old  . Hypertension Sister   . Colon cancer Paternal Grandmother   . Colon cancer Paternal Uncle     Social History   Socioeconomic History  . Marital status: Single    Spouse name: Not on file  . Number of children: 1  . Years of education: Not on file  . Highest education level: Not on file  Occupational History  . Occupation: Interior and spatial designer: [employer]  Social Needs  . Financial resource strain: Not on file  . Food insecurity:    Worry: Not on file    Inability: Not on file  . Transportation needs:    Medical: Not on file    Non-medical: Not on file  Tobacco Use  .  Smoking status: Never Smoker  . Smokeless tobacco: Never Used  Substance and Sexual Activity  . Alcohol use: No  . Drug use: No  . Sexual activity: Not Currently    Birth control/protection: None, Surgical  Lifestyle  . Physical activity:    Days per week: 7 days    Minutes per session: 30 min  . Stress: Only a little  Relationships  . Social connections:    Talks on phone: More than three times a week    Gets together: Twice a week    Attends religious service: More than 4 times per year    Active member of club or organization: No    Attends meetings of clubs or organizations: Never    Relationship status: Never married  . Intimate partner violence:    Fear of current or ex partner: No    Emotionally abused: No    Physically abused: No    Forced sexual activity: No  Other  Topics Concern  . Not on file  Social History Narrative  . Not on file    Review of Systems: Complete ROS negative except as per HPI.    Physical Exam: BP 137/81   Pulse (!) 110   Temp 97.8 F (36.6 C) (Oral)   Ht 6' (1.829 m)   Wt 267 lb (121.1 kg)   LMP 01/31/2011   BMI 36.21 kg/m  General:   Alert and oriented. Pleasant and cooperative. Well-nourished and well-developed.  Eyes:  Without icterus, sclera clear and conjunctiva pink.  Ears:  Normal auditory acuity. Cardiovascular:  S1, S2 present without murmurs appreciated. Extremities without clubbing or edema. Respiratory:  Clear to auscultation bilaterally. No wheezes, rales, or rhonchi. No distress.  Gastrointestinal:  +BS, soft, non-tender and non-distended. No HSM noted. No guarding or rebound. No masses appreciated.  Rectal:  Deferred  Musculoskalatal:  Symmetrical without gross deformities. Neurologic:  Alert and oriented x4;  grossly normal neurologically. Psych:  Alert and cooperative. Normal mood and affect. Heme/Lymph/Immune: No excessive bruising noted.    08/01/2017 3:40 PM   Disclaimer: This note was dictated with voice recognition software. Similar sounding words can inadvertently be transcribed and may not be corrected upon review.

## 2017-08-01 NOTE — Assessment & Plan Note (Addendum)
History of colon cancer.  Her father had colon cancer and was diagnosed at age 50, passed away at age 69.  She also had a couple secondary relatives in her paternal grandfather and paternal uncle with colon cancer.  She has never had a colonoscopy before.  She is generally asymptomatic from a GI standpoint.  Her only complicating factor is she is on Eliquis for a pulmonary embolism diagnosed in November 2018.  Recent CT of the chest looked stable.  We will check with primary care who manages her Eliquis for the okay to hold Eliquis for 48 hours prior to procedure.  We may need to defer colonoscopy for a few more months, depending on primary care response.  Follow-up based on post procedure recommendations.  However, we will tentatively schedule her colonoscopy.  Proceed with colonoscopy with Dr. Oneida Alar in the near future. The risks, benefits, and alternatives have been discussed in detail with the patient. They state understanding and desire to proceed.   The patient is currently on Eliquis for pulmonary embolism.  No other anticoagulants, anxiolytics, chronic pain medications, or antidepressants.  Conscious sedation should be adequate for her procedure.

## 2017-08-02 ENCOUNTER — Encounter: Payer: Self-pay | Admitting: *Deleted

## 2017-08-02 ENCOUNTER — Other Ambulatory Visit: Payer: Self-pay | Admitting: *Deleted

## 2017-08-02 DIAGNOSIS — Z800 Family history of malignant neoplasm of digestive organs: Secondary | ICD-10-CM

## 2017-08-02 MED ORDER — PEG 3350-KCL-NA BICARB-NACL 420 G PO SOLR: 4000.0000 mL | Freq: Once | ORAL | 0 refills | Status: AC

## 2017-08-02 NOTE — Telephone Encounter (Signed)
Ok to [employer] (patient is not on Xarelto) 2 days prior to procedure. She should resume Eliquis 2 days or 48 hours after the procedure  Thank you

## 2017-08-02 NOTE — Telephone Encounter (Signed)
Forwarding FYI to Walden Field, NP and to [facility] to schedule.

## 2017-08-02 NOTE — Telephone Encounter (Signed)
Spoke with patient and is aware okay to hold eliquis 48 hrs prior to procedure. She is scheduled for TCS w/ [facility] on 09/17/17 at 2:15pm. [facility]. Prep instructions have been mailed to her (confirmed mailing address).

## 2017-08-03 ENCOUNTER — Telehealth: Payer: Self-pay | Admitting: Hematology and Oncology

## 2017-08-03 NOTE — Telephone Encounter (Signed)
Noted, no further recommendations. 

## 2017-08-03 NOTE — Telephone Encounter (Signed)
Called pt re appts being moved to [facility] - spoke w/ pt re appts.

## 2017-08-10 ENCOUNTER — Ambulatory Visit: Payer: Self-pay | Attending: Nurse Practitioner | Admitting: Nurse Practitioner

## 2017-08-10 ENCOUNTER — Encounter: Payer: Self-pay | Admitting: Nurse Practitioner

## 2017-08-10 VITALS — BP 102/71 | HR 104 | Temp 98.4°F | Ht 72.0 in | Wt 270.0 lb

## 2017-08-10 DIAGNOSIS — R600 Localized edema: Secondary | ICD-10-CM | POA: Insufficient documentation

## 2017-08-10 DIAGNOSIS — N3281 Overactive bladder: Secondary | ICD-10-CM | POA: Insufficient documentation

## 2017-08-10 MED ORDER — OXYBUTYNIN CHLORIDE ER 10 MG PO TB24: 10.0000 mg | ORAL_TABLET | Freq: Every day | ORAL | 1 refills | Status: DC

## 2017-08-10 MED FILL — OXYBUTYNIN CL ER 10 MG TAB: 10 | 30 days supply | Qty: 30 | Fill #0

## 2017-08-10 NOTE — Progress Notes (Signed)
Assessment & Plan:  [name] was seen today for follow-up.  Diagnoses and all orders for this visit:  Overactive bladder -     oxybutynin (DITROPAN XL) 10 MG 24 hr tablet; Take 1 tablet (10 mg total) by mouth at bedtime.  Leg edema, left -     VAS [place] LOWER EXTREMITY VENOUS (DVT); Future -     AMB referral to wound care center    Patient has been counseled on age-appropriate routine health concerns for screening and prevention. These are reviewed and up-to-date. Referrals have been placed accordingly. Immunizations are up-to-date or declined.    Subjective:   Chief Complaint  Patient presents with  . Follow-up    Pt. is here for a follow-up.   HPI [name] 52 y.o. female presents to office today with complaints of increased BLE despite taking lasix 20mg  daily.   Worsening BLE edema Worsening BLE edema. The location of the edema is lower leg(s) bilateral, ankle(s) bilateral, feet bilateral,  toe(s) bilateral with worsening on the left.  The edema has been severe.  Onset of symptoms was a few months ago, gradually worsening since that time. The edema is present all dayThe swelling has been aggravated by nothing, relieved by nothing, and been associated with weight gain. Cardiac risk factors include obesity (BMI >= 30 kg/m2) and sedentary lifestyle.   Urinary Incontinence Patient complains of urinary incontinence. This has been present for several months.Onset 6-7 months after being treated for pyelonephritis.  She leaks urine with with urge, with a full bladder, during the night. Patient describes the symptoms as  frequent urination (severalx per day), nocturia several times per night and urge to urinate with little or no warning.  Factors associated with symptoms include medications (lasix). Evaluation to date includes none.Treatment to date includes none.     Review of Systems  Constitutional: Negative for fever, malaise/fatigue and weight loss.  HENT: Negative.   Negative for nosebleeds.   Eyes: Negative.  Negative for blurred vision, double vision and photophobia.  Respiratory: Positive for shortness of breath (with exertion). Negative for cough.   Cardiovascular: Positive for leg swelling. Negative for chest pain and palpitations.  Gastrointestinal: Negative.  Negative for heartburn, nausea and vomiting.  Genitourinary: Positive for frequency and urgency. Negative for dysuria, flank pain and hematuria.       SEE HPI  Musculoskeletal: Negative.  Negative for myalgias.  Neurological: Negative.  Negative for dizziness, focal weakness, seizures and headaches.  Psychiatric/Behavioral: Negative for suicidal ideas. The patient is nervous/anxious.     Past Medical History:  Diagnosis Date  . Anemia   . Anxiety   . Asthma    hx as child - no prob as adult - no inhaler  . Blood transfusion 01/22/11   transfusion 2 units at [facility]  . Depression 08/2010   psych assessment  . Dyspnea   . Fibroid   . Headache(784.0)    rx for imitrex - last one jan  . Keloid   . Pulmonary embolism [facility])     Past Surgical History:  Procedure Laterality Date  . ABDOMINAL HYSTERECTOMY    . HERNIA REPAIR  7867   umbilical  . SVD     Spontaneous vaginal delivery; x 1    Family History  Problem Relation Age of Onset  . Heart disease Mother   . Hypertension Mother   . Hypertension Father   . Colon cancer Father 82       Passed away 4 yrs  old  . Hypertension Sister   . Colon cancer Paternal Grandmother   . Colon cancer Paternal Uncle     Social History Reviewed with no changes to be made today.   Outpatient Medications Prior to Visit  Medication Sig Dispense Refill  . acetaminophen (TYLENOL) 500 MG tablet Take 1,500 mg by mouth every 6 (six) hours as needed for moderate pain.    Marland Kitchen apixaban (ELIQUIS) 5 MG TABS tablet Take 1 tablet (5 mg total) by mouth 2 (two) times daily. 60 tablet 3  . furosemide (LASIX) 20 MG tablet Take 1 tablet (20 mg total) by mouth  daily. 30 tablet 1   No facility-administered medications prior to visit.     Allergies  Allergen Reactions  . Augmentin [Amoxicillin-Pot Clavulanate] Hives and Itching    Did PCN reaction causing immediate rash, facial/tongue/throat swelling, SOB or lightheadedness with hypotension: no Did PCN reaction causing severe rash involving mucus membranes or skin necrosis: no Has patient had a PCN reaction that required hospitalization: in hospital Has patient had a PCN reaction occurring within the last 10 years: no If all of the above answers are "NO", then may proceed with Cephalosporin use.  Pt reports no recollection of any reactions when taking penicillin in past  . Latex Hives    Local reaction (welts). Patient denies any wheezing or other reaction with latex exposure       Objective:    BP 102/71 (BP Location: Right Arm, Patient Position: Sitting, Cuff Size: Large)   Pulse (!) 104   Temp 98.4 F (36.9 C) (Oral)   Ht 6' (1.829 m)   Wt 270 lb (122.5 kg)   LMP 01/31/2011   SpO2 94%   BMI 36.62 kg/m  Wt Readings from Last 3 Encounters:  08/10/17 270 lb (122.5 kg)  08/01/17 267 lb (121.1 kg)  03/26/17 279 lb 9.6 oz (126.8 kg)    Physical Exam  Constitutional: She is oriented to person, place, and time. She appears well-developed and well-nourished. She is cooperative.  HENT:  Head: Normocephalic and atraumatic.  Eyes: EOM are normal.  Neck: Normal range of motion.  Cardiovascular: Normal rate, regular rhythm and normal heart sounds. Exam reveals no gallop and no friction rub.  No murmur heard. Pulmonary/Chest: Effort normal and breath sounds normal. No tachypnea. No respiratory distress. She has no decreased breath sounds. She has no wheezes. She has no rhonchi. She has no rales. She exhibits no tenderness.  Abdominal: Soft. Bowel sounds are normal.  Musculoskeletal: Normal range of motion. She exhibits edema, tenderness and deformity.       Right knee: She exhibits  swelling.       Left knee: She exhibits swelling.       Right ankle: She exhibits swelling.       Left ankle: She exhibits swelling.       Right lower leg: She exhibits swelling and edema.       Left lower leg: She exhibits swelling and edema.       Right foot: There is tenderness and swelling.       Left foot: There is tenderness and swelling.  She has severe non pitting edema of the BLE. L>R.   Neurological: She is alert and oriented to person, place, and time. Coordination normal.  Skin: Skin is warm and dry.  Psychiatric: She has a normal mood and affect. Her behavior is normal. Judgment and thought content normal.  Nursing note and vitals reviewed.  Patient has been counseled extensively about nutrition and exercise as well as the importance of adherence with medications and regular follow-up. The patient was given clear instructions to go to ER or return to medical center if symptoms don't improve, worsen or new problems develop. The patient verbalized understanding.   Follow-up: Return in about 1 month (around 09/07/2017) for BLE edema, ditropan.   Gildardo Pounds, FNP-BC [facility] and [facility], [place]   08/11/2017, 12:52 AM

## 2017-08-10 NOTE — Patient Instructions (Signed)

## 2017-08-13 ENCOUNTER — Telehealth: Payer: Self-pay | Admitting: Nurse Practitioner

## 2017-08-13 ENCOUNTER — Ambulatory Visit (HOSPITAL_COMMUNITY)
Admission: RE | Admit: 2017-08-13 | Discharge: 2017-08-13 | Disposition: A | Discharge status: Home / Self Care | Payer: Self-pay | Source: Ambulatory Visit | Attending: Nurse Practitioner | Admitting: Nurse Practitioner

## 2017-08-13 DIAGNOSIS — R600 Localized edema: Secondary | ICD-10-CM | POA: Insufficient documentation

## 2017-08-13 NOTE — Progress Notes (Signed)
Left lower extremity venous duplex completed. There is no evidence of DVT or Baker's cyst.  Toma Copier, RVS  08/13/2017 2:52 pm

## 2017-08-13 NOTE — Telephone Encounter (Signed)
[place] called and requested to speak with pcp or pcp nurse regarding labs.

## 2017-08-14 NOTE — Telephone Encounter (Signed)
CMA spoke to [place] from vascular lab regarding the imaging order results.

## 2017-08-16 NOTE — Telephone Encounter (Signed)
CMA spoke to patient's mother to inform on Ultrasound results.

## 2017-08-16 NOTE — Telephone Encounter (Signed)
-----   Message from Gildardo Pounds, NP sent at 08/13/2017 11:19 PM EDT ----- Doppler negative for clot

## 2017-08-20 ENCOUNTER — Telehealth: Payer: Self-pay | Admitting: *Deleted

## 2017-08-20 NOTE — Telephone Encounter (Signed)
Patient verified DOB Patient states the wound center declined her care due to not having an open wound. Was advised to visit center in [place] to address lymphedema. Lymphedema Clinic:[phone] [place] will also address this type of concern with reference to Cheral Almas: [phone].

## 2017-08-20 NOTE — Telephone Encounter (Signed)
Unable to contact patient or left a message . Sent Referral to  [facility] they are part of [facility] the other facilities are not affiliated with [facility]

## 2017-08-20 NOTE — Telephone Encounter (Signed)
Noted. Agree with plan. She should also snugly wrap her legs with ace wrap  daily.

## 2017-08-23 MED FILL — !ELIQUIS 5 MG TABLET: 5 | 30 days supply | Qty: 60 | Fill #3

## 2017-09-04 ENCOUNTER — Telehealth: Payer: Self-pay | Admitting: Nurse Practitioner

## 2017-09-04 ENCOUNTER — Ambulatory Visit: Payer: Self-pay | Admitting: Physician Assistant

## 2017-09-04 NOTE — Telephone Encounter (Signed)
[facility] for lymphedema therapy -order  [phone]  [phone]

## 2017-09-10 ENCOUNTER — Ambulatory Visit: Payer: Self-pay | Attending: Nurse Practitioner | Admitting: Nurse Practitioner

## 2017-09-10 ENCOUNTER — Encounter: Payer: Self-pay | Admitting: Nurse Practitioner

## 2017-09-10 VITALS — BP 105/71 | HR 112 | Temp 99.4°F | Ht 72.0 in

## 2017-09-10 DIAGNOSIS — N3281 Overactive bladder: Secondary | ICD-10-CM | POA: Insufficient documentation

## 2017-09-10 DIAGNOSIS — Z791 Long term (current) use of non-steroidal anti-inflammatories (NSAID): Secondary | ICD-10-CM | POA: Insufficient documentation

## 2017-09-10 DIAGNOSIS — Z800 Family history of malignant neoplasm of digestive organs: Secondary | ICD-10-CM | POA: Insufficient documentation

## 2017-09-10 DIAGNOSIS — Z86711 Personal history of pulmonary embolism: Secondary | ICD-10-CM | POA: Insufficient documentation

## 2017-09-10 DIAGNOSIS — Z79899 Other long term (current) drug therapy: Secondary | ICD-10-CM | POA: Insufficient documentation

## 2017-09-10 DIAGNOSIS — Z9889 Other specified postprocedural states: Secondary | ICD-10-CM | POA: Insufficient documentation

## 2017-09-10 DIAGNOSIS — Z09 Encounter for follow-up examination after completed treatment for conditions other than malignant neoplasm: Secondary | ICD-10-CM | POA: Insufficient documentation

## 2017-09-10 DIAGNOSIS — Z91040 Latex allergy status: Secondary | ICD-10-CM | POA: Insufficient documentation

## 2017-09-10 DIAGNOSIS — J45909 Unspecified asthma, uncomplicated: Secondary | ICD-10-CM | POA: Insufficient documentation

## 2017-09-10 DIAGNOSIS — R768 Other specified abnormal immunological findings in serum: Secondary | ICD-10-CM | POA: Insufficient documentation

## 2017-09-10 DIAGNOSIS — Z880 Allergy status to penicillin: Secondary | ICD-10-CM | POA: Insufficient documentation

## 2017-09-10 DIAGNOSIS — Z8249 Family history of ischemic heart disease and other diseases of the circulatory system: Secondary | ICD-10-CM | POA: Insufficient documentation

## 2017-09-10 MED ORDER — OXYBUTYNIN CHLORIDE ER 10 MG PO TB24: 10.0000 mg | ORAL_TABLET | Freq: Every day | ORAL | 1 refills | Status: DC

## 2017-09-10 MED FILL — OXYBUTYNIN CL ER 10 MG TAB: 10 | 30 days supply | Qty: 30 | Fill #0

## 2017-09-10 NOTE — Progress Notes (Signed)
Assessment & Plan:  [name] was seen today for follow-up.  Diagnoses and all orders for this visit:  Overactive bladder -     oxybutynin (DITROPAN XL) 10 MG 24 hr tablet; Take 1 tablet (10 mg total) by mouth at bedtime.  ANA positive -     Ambulatory referral to Rheumatology Elevated RF and possible inflammatory arthhropathy  Patient has been counseled on age-appropriate routine health concerns for screening and prevention. These are reviewed and up-to-date. Referrals have been placed accordingly. Immunizations are up-to-date or declined.    Subjective:   Chief Complaint  Patient presents with  . Follow-up    Pt. is here to follow-up on edema. Pt. stated she has been feeling dizzy with movement.    HPI [name] 52 y.o. female presents to office today for follow up to OAB.   OAB She was started on Ditropan for OAB at her last office visit with me on 08-10-2017. Today she endorses improvement in her symptoms however now experiencing mild dizziness since taking. Will have her decrease by 5 mg. She is also endorsing increased bilateral hand pain with radiation of pain into both arms. There was concern of possible inflammatory arthropathy per Oncology notes. Will refer to rheumatology at this time.    Review of Systems  Constitutional: Negative for fever, malaise/fatigue and weight loss.  HENT: Negative.  Negative for nosebleeds.   Eyes: Negative.  Negative for blurred vision, double vision and photophobia.  Respiratory: Negative.  Negative for cough and shortness of breath.   Cardiovascular: Negative.  Negative for chest pain, palpitations and leg swelling.  Gastrointestinal: Negative.  Negative for heartburn, nausea and vomiting.  Genitourinary:       SEE HPI  Musculoskeletal: Positive for joint pain. Negative for myalgias.       SEE HPI  Neurological: Positive for dizziness and sensory change. Negative for focal weakness, seizures and headaches.    Psychiatric/Behavioral: Negative.  Negative for suicidal ideas.    Past Medical History:  Diagnosis Date  . Anemia   . Anxiety   . Asthma    hx as child - no prob as adult - no inhaler  . Blood transfusion 01/22/11   transfusion 2 units at [facility]  . Depression 08/2010   psych assessment  . Dyspnea   . Fibroid   . Headache(784.0)    rx for imitrex - last one jan  . Keloid   . Pulmonary embolism [facility])     Past Surgical History:  Procedure Laterality Date  . ABDOMINAL HYSTERECTOMY    . HERNIA REPAIR  9629   umbilical  . SVD     Spontaneous vaginal delivery; x 1    Family History  Problem Relation Age of Onset  . Heart disease Mother   . Hypertension Mother   . Hypertension Father   . Colon cancer Father 62       Passed away 34 yrs old  . Hypertension Sister   . Colon cancer Paternal Grandmother   . Colon cancer Paternal Uncle     Social History Reviewed with no changes to be made today.   Outpatient Medications Prior to Visit  Medication Sig Dispense Refill  . acetaminophen (TYLENOL) 500 MG tablet Take 1,500 mg by mouth every 6 (six) hours as needed for moderate pain.    Marland Kitchen apixaban (ELIQUIS) 5 MG TABS tablet Take 1 tablet (5 mg total) by mouth 2 (two) times daily. 60 tablet 3  . furosemide (LASIX) 20 MG  tablet Take 1 tablet (20 mg total) by mouth daily. 30 tablet 1  . oxybutynin (DITROPAN XL) 10 MG 24 hr tablet Take 1 tablet (10 mg total) by mouth at bedtime. 30 tablet 1   No facility-administered medications prior to visit.     Allergies  Allergen Reactions  . Augmentin [Amoxicillin-Pot Clavulanate] Hives and Itching    Did PCN reaction causing immediate rash, facial/tongue/throat swelling, SOB or lightheadedness with hypotension: no Did PCN reaction causing severe rash involving mucus membranes or skin necrosis: no Has patient had a PCN reaction that required hospitalization: in hospital Has patient had a PCN reaction occurring within the last 10 years: no If  all of the above answers are "NO", then may proceed with Cephalosporin use.  Pt reports no recollection of any reactions when taking penicillin in past  . Latex Hives    Local reaction (welts). Patient denies any wheezing or other reaction with latex exposure       Objective:    BP 105/71 (BP Location: Left Arm, Patient Position: Sitting, Cuff Size: Large)   Pulse (!) 112   Temp 99.4 F (37.4 C) (Oral)   Ht 6' (1.829 m)   LMP 01/31/2011   SpO2 95%   BMI 36.62 kg/m  Wt Readings from Last 3 Encounters:  08/10/17 270 lb (122.5 kg)  08/01/17 267 lb (121.1 kg)  03/26/17 279 lb 9.6 oz (126.8 kg)    Physical Exam  Constitutional: She is oriented to person, place, and time. She appears well-developed and well-nourished. She is cooperative.  HENT:  Head: Normocephalic and atraumatic.  Eyes: EOM are normal.  Neck: Normal range of motion.  Cardiovascular: Regular rhythm, normal heart sounds and intact distal pulses. Tachycardia present. Exam reveals no gallop and no friction rub.  No murmur heard. Pulmonary/Chest: Effort normal and breath sounds normal. No tachypnea. No respiratory distress. She has no decreased breath sounds. She has no wheezes. She has no rhonchi. She has no rales. She exhibits no tenderness.  Abdominal: Soft. Bowel sounds are normal.  Musculoskeletal: Normal range of motion. She exhibits no edema.  Neurological: She is alert and oriented to person, place, and time. Gait (sitting on seated rolling walker) abnormal. Coordination normal.  Skin: Skin is warm and dry.  Psychiatric: She has a normal mood and affect. Her behavior is normal. Judgment and thought content normal.  Nursing note and vitals reviewed.      Patient has been counseled extensively about nutrition and exercise as well as the importance of adherence with medications and regular follow-up. The patient was given clear instructions to go to ER or return to medical center if symptoms don't improve,  worsen or new problems develop. The patient verbalized understanding.   Follow-up: Return in about 3 months (around 12/11/2017).   Gildardo Pounds, FNP-BC [facility], [place]   09/10/2017, 7:10 PM

## 2017-09-11 NOTE — Telephone Encounter (Signed)
CMA spoke to [facility] outpatient therapy regarding the therapy order they had call. [facility] stated patient tried to call to set up an appt. But they have not receive an referral order from PCP and unable to schedule patient an appt. For lymphedema therapy.   Pt. Will need a referral for therapy.
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
## 2017-09-14 ENCOUNTER — Ambulatory Visit: Payer: Self-pay | Admitting: Hematology

## 2017-09-14 ENCOUNTER — Inpatient Hospital Stay: Payer: Self-pay | Attending: Hematology | Admitting: Hematology

## 2017-09-14 ENCOUNTER — Encounter: Payer: Self-pay | Admitting: Hematology

## 2017-09-14 ENCOUNTER — Telehealth: Payer: Self-pay | Admitting: Hematology

## 2017-09-14 VITALS — BP 132/76 | HR 100 | Temp 98.0°F | Resp 19 | Ht 72.0 in | Wt 267.6 lb

## 2017-09-14 DIAGNOSIS — M25542 Pain in joints of left hand: Secondary | ICD-10-CM | POA: Insufficient documentation

## 2017-09-14 DIAGNOSIS — R32 Unspecified urinary incontinence: Secondary | ICD-10-CM | POA: Insufficient documentation

## 2017-09-14 DIAGNOSIS — D6862 Lupus anticoagulant syndrome: Secondary | ICD-10-CM | POA: Insufficient documentation

## 2017-09-14 DIAGNOSIS — M25521 Pain in right elbow: Secondary | ICD-10-CM | POA: Insufficient documentation

## 2017-09-14 DIAGNOSIS — Z7901 Long term (current) use of anticoagulants: Secondary | ICD-10-CM | POA: Insufficient documentation

## 2017-09-14 DIAGNOSIS — M25422 Effusion, left elbow: Secondary | ICD-10-CM | POA: Insufficient documentation

## 2017-09-14 DIAGNOSIS — M25421 Effusion, right elbow: Secondary | ICD-10-CM | POA: Insufficient documentation

## 2017-09-14 DIAGNOSIS — M25441 Effusion, right hand: Secondary | ICD-10-CM | POA: Insufficient documentation

## 2017-09-14 DIAGNOSIS — M25442 Effusion, left hand: Secondary | ICD-10-CM | POA: Insufficient documentation

## 2017-09-14 DIAGNOSIS — R591 Generalized enlarged lymph nodes: Secondary | ICD-10-CM | POA: Insufficient documentation

## 2017-09-14 DIAGNOSIS — M25541 Pain in joints of right hand: Secondary | ICD-10-CM | POA: Insufficient documentation

## 2017-09-14 DIAGNOSIS — I2699 Other pulmonary embolism without acute cor pulmonale: Secondary | ICD-10-CM | POA: Insufficient documentation

## 2017-09-14 DIAGNOSIS — M25522 Pain in left elbow: Secondary | ICD-10-CM | POA: Insufficient documentation

## 2017-09-14 DIAGNOSIS — R599 Enlarged lymph nodes, unspecified: Secondary | ICD-10-CM

## 2017-09-14 MED FILL — PEG-3350 SOLUTION: 420 | 1 days supply | Qty: 4000 | Fill #0

## 2017-09-14 NOTE — Telephone Encounter (Signed)
Scheduled appt per 7/19 los - gave patient AVS and calender per los. 

## 2017-09-15 ENCOUNTER — Encounter: Payer: Self-pay | Admitting: Hematology

## 2017-09-15 DIAGNOSIS — R591 Generalized enlarged lymph nodes: Secondary | ICD-10-CM | POA: Insufficient documentation

## 2017-09-15 DIAGNOSIS — R599 Enlarged lymph nodes, unspecified: Secondary | ICD-10-CM | POA: Insufficient documentation

## 2017-09-16 ENCOUNTER — Other Ambulatory Visit: Payer: Self-pay | Admitting: Nurse Practitioner

## 2017-09-16 DIAGNOSIS — I890 Lymphedema, not elsewhere classified: Secondary | ICD-10-CM

## 2017-09-16 NOTE — Telephone Encounter (Signed)
Referral has been placed. 

## 2017-09-17 ENCOUNTER — Ambulatory Visit (HOSPITAL_COMMUNITY)
Admission: RE | Admit: 2017-09-17 | Discharge: 2017-09-17 | Disposition: A | Discharge status: Home / Self Care | Payer: Self-pay | Source: Ambulatory Visit | Attending: Gastroenterology | Admitting: Gastroenterology

## 2017-09-17 ENCOUNTER — Telehealth: Payer: Self-pay

## 2017-09-17 ENCOUNTER — Other Ambulatory Visit: Payer: Self-pay

## 2017-09-17 ENCOUNTER — Encounter (HOSPITAL_COMMUNITY): Payer: Self-pay

## 2017-09-17 ENCOUNTER — Encounter (HOSPITAL_COMMUNITY)
Admission: RE | Disposition: A | Discharge status: Home / Self Care | Payer: Self-pay | Source: Ambulatory Visit | Attending: Gastroenterology

## 2017-09-17 DIAGNOSIS — Z86711 Personal history of pulmonary embolism: Secondary | ICD-10-CM | POA: Insufficient documentation

## 2017-09-17 DIAGNOSIS — K644 Residual hemorrhoidal skin tags: Secondary | ICD-10-CM | POA: Insufficient documentation

## 2017-09-17 DIAGNOSIS — Z7901 Long term (current) use of anticoagulants: Secondary | ICD-10-CM | POA: Insufficient documentation

## 2017-09-17 DIAGNOSIS — Z1211 Encounter for screening for malignant neoplasm of colon: Secondary | ICD-10-CM | POA: Insufficient documentation

## 2017-09-17 DIAGNOSIS — Z880 Allergy status to penicillin: Secondary | ICD-10-CM | POA: Insufficient documentation

## 2017-09-17 DIAGNOSIS — Z8249 Family history of ischemic heart disease and other diseases of the circulatory system: Secondary | ICD-10-CM | POA: Insufficient documentation

## 2017-09-17 DIAGNOSIS — F329 Major depressive disorder, single episode, unspecified: Secondary | ICD-10-CM | POA: Insufficient documentation

## 2017-09-17 DIAGNOSIS — Z91040 Latex allergy status: Secondary | ICD-10-CM | POA: Insufficient documentation

## 2017-09-17 DIAGNOSIS — Z800 Family history of malignant neoplasm of digestive organs: Secondary | ICD-10-CM | POA: Insufficient documentation

## 2017-09-17 DIAGNOSIS — Z79899 Other long term (current) drug therapy: Secondary | ICD-10-CM | POA: Insufficient documentation

## 2017-09-17 DIAGNOSIS — F419 Anxiety disorder, unspecified: Secondary | ICD-10-CM | POA: Insufficient documentation

## 2017-09-17 DIAGNOSIS — Z538 Procedure and treatment not carried out for other reasons: Secondary | ICD-10-CM | POA: Insufficient documentation

## 2017-09-17 HISTORY — PX: FLEXIBLE SIGMOIDOSCOPY: SHX5431

## 2017-09-17 SURGERY — SIGMOIDOSCOPY, FLEXIBLE
Anesthesia: Moderate Sedation

## 2017-09-17 MED ORDER — SODIUM CHLORIDE 0.9 % IV SOLN
INTRAVENOUS | Status: DC
  Administered 2017-09-17: 14:00:00 via INTRAVENOUS

## 2017-09-17 MED ORDER — MIDAZOLAM HCL 5 MG/5ML IJ SOLN
INTRAMUSCULAR | Status: AC
  Filled 2017-09-17: qty 10

## 2017-09-17 MED ORDER — MIDAZOLAM HCL 5 MG/5ML IJ SOLN
INTRAMUSCULAR | Status: DC | PRN
  Administered 2017-09-17 (×2): 2 mg via INTRAVENOUS
  Administered 2017-09-17: 1 mg via INTRAVENOUS

## 2017-09-17 MED ORDER — MEPERIDINE HCL 100 MG/ML IJ SOLN
INTRAMUSCULAR | Status: AC
  Filled 2017-09-17: qty 2

## 2017-09-17 MED ORDER — MEPERIDINE HCL 100 MG/ML IJ SOLN
INTRAMUSCULAR | Status: DC | PRN
  Administered 2017-09-17 (×3): 25 mg via INTRAVENOUS

## 2017-09-17 NOTE — Telephone Encounter (Signed)
CMA attempt to call patient to inform PCP advising.  No answer and was able to talk to Patient's mother and inform her PCP advising.  Patient's mother will inform patient.

## 2017-09-17 NOTE — H&P (Signed)
Primary Care Physician:  Gildardo Pounds, NP Primary Gastroenterologist:  Dr. Oneida Alar  Pre-Procedure History & Physical: HPI:  [name] is a 52 y.o. female here for FAMILY Hx COLON CA-FATHER HAD COLON CA AGE < 8.  Past Medical History:  Diagnosis Date  . Anemia   . Anxiety   . Asthma    hx as child - no prob as adult - no inhaler  . Blood transfusion 01/22/11   transfusion 2 units at [facility]  . Depression 08/2010   psych assessment  . Dyspnea   . Fibroid   . Headache(784.0)    rx for imitrex - last one jan  . Keloid   . Pulmonary embolism [facility])     Past Surgical History:  Procedure Laterality Date  . ABDOMINAL HYSTERECTOMY    . HERNIA REPAIR  0947   umbilical  . SVD     Spontaneous vaginal delivery; x 1    Prior to Admission medications   Medication Sig Start Date End Date Taking? Authorizing Provider  acetaminophen (TYLENOL) 500 MG tablet Take 1,500 mg by mouth every 6 (six) hours as needed for moderate pain.   Yes [provider]  apixaban (ELIQUIS) 5 MG TABS tablet Take 1 tablet (5 mg total) by mouth 2 (two) times daily. 05/22/17  Yes Gildardo Pounds, NP  furosemide (LASIX) 20 MG tablet Take 1 tablet (20 mg total) by mouth daily. 03/09/17  Yes Ena Dawley, Tiffany S, PA-C  oxybutynin (DITROPAN XL) 10 MG 24 hr tablet Take 1 tablet (10 mg total) by mouth at bedtime. 09/10/17  Yes Gildardo Pounds, NP    Allergies as of 08/02/2017 - Review Complete 08/01/2017  Allergen Reaction Noted  . Augmentin [amoxicillin-pot clavulanate] Hives and Itching 11/22/2011  . Latex Hives 02/22/2011    Family History  Problem Relation Age of Onset  . Heart disease Mother   . Hypertension Mother   . Hypertension Father   . Colon cancer Father 36       Passed away 20 yrs old  . Hypertension Sister   . Cancer Maternal Grandmother        gastric cancer  . Cancer Maternal Grandfather        pancreatic cancer  . Colon cancer Paternal Grandmother   . Colon cancer Paternal Uncle    . Colon polyps Neg Hx     Social History   Socioeconomic History  . Marital status: Single    Spouse name: Not on file  . Number of children: 1  . Years of education: Not on file  . Highest education level: Not on file  Occupational History  . Occupation: Interior and spatial designer: [employer]  Social Needs  . Financial resource strain: Not on file  . Food insecurity:    Worry: Not on file    Inability: Not on file  . Transportation needs:    Medical: Not on file    Non-medical: Not on file  Tobacco Use  . Smoking status: Never Smoker  . Smokeless tobacco: Never Used  Substance and Sexual Activity  . Alcohol use: No  . Drug use: No  . Sexual activity: Not Currently    Birth control/protection: None, Surgical  Lifestyle  . Physical activity:    Days per week: 7 days    Minutes per session: 30 min  . Stress: Only a little  Relationships  . Social connections:    Talks on phone: More than three times a week  Gets together: Twice a week    Attends religious service: More than 4 times per year    Active member of club or organization: No    Attends meetings of clubs or organizations: Never    Relationship status: Never married  . Intimate partner violence:    Fear of current or ex partner: No    Emotionally abused: No    Physically abused: No    Forced sexual activity: No  Other Topics Concern  . Not on file  Social History Narrative  . Not on file    Review of Systems: See HPI, otherwise negative ROS   Physical Exam: BP 106/74   Pulse 85   Temp 97.7 F (36.5 C) (Oral)   Resp 16   LMP 01/31/2011   SpO2 100%  General:   Alert,  pleasant and cooperative in NAD Head:  Normocephalic and atraumatic. Neck:  Supple; Lungs:  Clear throughout to auscultation.    Heart:  Regular rate and rhythm. Abdomen:  Soft, nontender and nondistended. Normal bowel sounds, without guarding, and without rebound.   Neurologic:  Alert and  oriented x4;  grossly normal  neurologically.  Impression/Plan:     FAMILY Hx COLON CA-FATHER HAD COLON CA AGE < 60.  Plan:  1. TCS TODAY. DISCUSSED PROCEDURE, BENEFITS, & RISKS: < 1% chance of medication reaction, bleeding, perforation, or rupture of spleen/liver.

## 2017-09-17 NOTE — Op Note (Signed)
[facility] Patient Name: [name] Procedure Date: 09/17/2017 3:13 PM MRN: [id number] Date of Birth: Jan 07, 1966 Attending MD: Barney Drain MD, MD CSN: [id number] Age: 52 Admit Type: Outpatient Procedure:                Colonoscopy, INCOMPLETE DUE TO POOR BOWEL PREP Indications:              Screening in patient at increased risk: Colorectal                            cancer in father before age 36. PT VOMITED NULYTE. Providers:                Barney Drain MD, MD, Janeece Riggers, RN, Randa Spike, Technician Referring MD:             Gildardo Pounds Medicines:                Meperidine 75 mg IV, Midazolam 5 mg IV Complications:            No immediate complications. Estimated Blood Loss:     Estimated blood loss: none. Procedure:                Pre-Anesthesia Assessment:                           - Prior to the procedure, a History and Physical                            was performed, and patient medications and                            allergies were reviewed. The patient's tolerance of                            previous anesthesia was also reviewed. The risks                            and benefits of the procedure and the sedation                            options and risks were discussed with the patient.                            All questions were answered, and informed consent                            was obtained. Prior Anticoagulants: The patient has                            taken Eliquis (apixaban), last dose was 2 days                            prior to procedure. ASA Grade Assessment: II - A  patient with mild systemic disease. After reviewing                            the risks and benefits, the patient was deemed in                            satisfactory condition to undergo the procedure.                            After obtaining informed consent, the colonoscope   was passed under direct vision. Throughout the                            procedure, the patient's blood pressure, pulse, and                            oxygen saturations were monitored continuously. The                            CF-[id number] ([id number]) scope was introduced through                            the anus and advanced to the the sigmoid colon. The                            colonoscopy was performed with moderate difficulty                            due to poor bowel prep. The patient tolerated the                            procedure well. The quality of the bowel                            preparation was poor. The rectum was photographed. Scope In: 3:54:24 PM Scope Out: 4:00:29 PM Total Procedure Duration: 0 hours 6 minutes 5 seconds  Findings:      External hemorrhoids were found. The hemorrhoids were small.      The exam was otherwise without abnormality. Impression:               - Preparation of the colon was poor.                           - External hemorrhoids.                           - The examination was otherwise normal. Moderate Sedation:      Moderate (conscious) sedation was administered by the endoscopy nurse       and supervised by the endoscopist. The following parameters were       monitored: oxygen saturation, heart rate, blood pressure, and response       to care. Total physician intraservice time was 23 minutes. Recommendation:           - High fiber diet.                           -  Continue present medications. RE-START ELIQUIS                            TODAY.                           - Repeat colonoscopy at the next available                            appointment for screening purposes WITH CLENPIQ.                            HOLD ELIQUIS 2 DAYS PRIOR TO NEXT TCS.                           - Patient has a contact number available for                            emergencies. The signs and symptoms of potential                            delayed  complications were discussed with the                            patient. Return to normal activities tomorrow.                            Written discharge instructions were provided to the                            patient. Procedure Code(s):        --- Professional ---                           [phone], 42, Colonoscopy, flexible; diagnostic,                            including collection of specimen(s) by brushing or                            washing, when performed (separate procedure)                           G0500, Moderate sedation services provided by the                            same physician or other qualified health care                            professional performing a gastrointestinal                            endoscopic service that sedation supports,                            requiring the presence  of an independent trained                            observer to assist in the monitoring of the                            patient's level of consciousness and physiological                            status; initial 15 minutes of intra-service time;                            patient age 75 years or older (additional time may                            be reported with [phone], as appropriate)                           [phone], Moderate sedation services provided by the                            same physician or other qualified health care                            professional performing the diagnostic or                            therapeutic service that the sedation supports,                            requiring the presence of an independent trained                            observer to assist in the monitoring of the                            patient's level of consciousness and physiological                            status; each additional 15 minutes intraservice                            time (List separately in addition to code for                             primary service) Diagnosis Code(s):        --- Professional ---                           Z80.0, Family history of malignant neoplasm of                            digestive organs  K64.4, Residual hemorrhoidal skin tags CPT copyright 2017 [employer]. All rights reserved. The codes documented in this report are preliminary and upon coder review may  be revised to meet current compliance requirements. Barney Drain, MD Barney Drain MD, MD 09/17/2017 4:15:37 PM This report has been signed electronically. Number of Addenda: 0

## 2017-09-17 NOTE — Telephone Encounter (Signed)
-----   Message from Gildardo Pounds, NP sent at 09/10/2017  7:24 PM EDT ----- Please call patient and tell her to take 5mg  of half a tablet of ditropan to see if this will help with her bladder symptoms.

## 2017-09-17 NOTE — Discharge Instructions (Signed)
Your prep was not good ENOUGH FOR A COMPLETE EXAM. YOU had formed stool in your colon SO YOUR COLONOSCOPY EXAM IS INCOMPLETE. I ONLY SAW THE LAST THIRD OF YOUR COLON. YOU DID NOT HAVE ANY POLYPS.    RE-START ELIQUIS TODAY.  DRINK WATER TO KEEP YOUR URINE LIGHT YELLOW.  FOLLOW A HIGH FIBER DIET. AVOID ITEMS THAT CAUSE BLOATING & GAS. SEE INFO BELOW.  Next colonoscopy in 3-4 weeks with Clenpiq sample prep. HOLD ELIQUIS 2 DAYS PRIOR TO COLONOSCOPY. BRING BACK YOUR COLOWRAP.  Colonoscopy Care After Read the instructions outlined below and refer to this sheet in the next week. These discharge instructions provide you with general information on caring for yourself after you leave the hospital. While your treatment has been planned according to the most current medical practices available, unavoidable complications occasionally occur. If you have any problems or questions after discharge, call DR. Vonnie Ligman, [phone].  ACTIVITY  You may resume your regular activity, but move at a slower pace for the next 24 hours.   Take frequent rest periods for the next 24 hours.   Walking will help get rid of the air and reduce the bloated feeling in your belly (abdomen).   No driving for 24 hours (because of the medicine (anesthesia) used during the test).   You may shower.   Do not sign any important legal documents or operate any machinery for 24 hours (because of the anesthesia used during the test).    NUTRITION  Drink plenty of fluids.   You may resume your normal diet as instructed by your doctor.   Begin with a light meal and progress to your normal diet. Heavy or fried foods are harder to digest and may make you feel sick to your stomach (nauseated).   Avoid alcoholic beverages for 24 hours or as instructed.    MEDICATIONS  You may resume your normal medications.   WHAT YOU CAN EXPECT TODAY  Some feelings of bloating in the abdomen.   Passage of more gas than usual.    Spotting of blood in your stool or on the toilet paper  .  IF YOU HAD POLYPS REMOVED DURING THE COLONOSCOPY:  Eat a soft diet IF YOU HAVE NAUSEA, BLOATING, ABDOMINAL PAIN, OR VOMITING.    FINDING OUT THE RESULTS OF YOUR TEST Not all test results are available during your visit. DR. Oneida Alar WILL CALL YOU WITHIN 14 DAYS OF YOUR PROCEDUE WITH YOUR RESULTS. Do not assume everything is normal if you have not heard from DR. Alvera Tourigny, CALL HER OFFICE AT [phone].  SEEK IMMEDIATE MEDICAL ATTENTION AND CALL THE OFFICE: [phone] IF:  You have more than a spotting of blood in your stool.   Your belly is swollen (abdominal distention).   You are nauseated or vomiting.   You have a temperature over 101F.   You have abdominal pain or discomfort that is severe or gets worse throughout the day.   High-Fiber Diet A high-fiber diet changes your normal diet to include more whole grains, legumes, fruits, and vegetables. Changes in the diet involve replacing refined carbohydrates with unrefined foods. The calorie level of the diet is essentially unchanged. The Dietary Reference Intake (recommended amount) for adult males is 38 grams per day. For adult females, it is 25 grams per day. Pregnant and lactating women should consume 28 grams of fiber per day. Fiber is the intact part of a plant that is not broken down during digestion. Functional fiber is fiber that has been  isolated from the plant to provide a beneficial effect in the body. PURPOSE  Increase stool bulk.   Ease and regulate bowel movements.   Lower cholesterol.   REDUCE RISK OF COLON CANCER  INDICATIONS THAT YOU NEED MORE FIBER  Constipation and hemorrhoids.   Uncomplicated diverticulosis (intestine condition) and irritable bowel syndrome.   Weight management.   As a protective measure against hardening of the arteries (atherosclerosis), diabetes, and cancer.   GUIDELINES FOR INCREASING FIBER IN THE DIET  Start adding  fiber to the diet slowly. A gradual increase of about 5 more grams (2 slices of whole-wheat bread, 2 servings of most fruits or vegetables, or 1 bowl of high-fiber cereal) per day is best. Too rapid an increase in fiber may result in constipation, flatulence, and bloating.   Drink enough water and fluids to keep your urine clear or pale yellow. Water, juice, or caffeine-free drinks are recommended. Not drinking enough fluid may cause constipation.   Eat a variety of high-fiber foods rather than one type of fiber.   Try to increase your intake of fiber through using high-fiber foods rather than fiber pills or supplements that contain small amounts of fiber.   The goal is to change the types of food eaten. Do not supplement your present diet with high-fiber foods, but replace foods in your present diet.   INCLUDE A VARIETY OF FIBER SOURCES  Replace refined and processed grains with whole grains, canned fruits with fresh fruits, and incorporate other fiber sources. White rice, white breads, and most bakery goods contain little or no fiber.   Brown whole-grain rice, buckwheat oats, and many fruits and vegetables are all good sources of fiber. These include: broccoli, Brussels sprouts, cabbage, cauliflower, beets, sweet potatoes, white potatoes (skin on), carrots, tomatoes, eggplant, squash, berries, fresh fruits, and dried fruits.   Cereals appear to be the richest source of fiber. Cereal fiber is found in whole grains and bran. Bran is the fiber-rich outer coat of cereal grain, which is largely removed in refining. In whole-grain cereals, the bran remains. In breakfast cereals, the largest amount of fiber is found in those with "bran" in their names. The fiber content is sometimes indicated on the label.   You may need to include additional fruits and vegetables each day.   In baking, for 1 cup white flour, you may use the following substitutions:   1 cup whole-wheat flour minus 2 tablespoons.    1/2 cup white flour plus 1/2 cup whole-wheat flour.   Polyps, Colon  A polyp is extra tissue that grows inside your body. Colon polyps grow in the large intestine. The large intestine, also called the colon, is part of your digestive system. It is a long, hollow tube at the end of your digestive tract where your body makes and stores stool. Most polyps are not dangerous. They are benign. This means they are not cancerous. But over time, some types of polyps can turn into cancer. Polyps that are smaller than a pea are usually not harmful. But larger polyps could someday become or may already be cancerous. To be safe, doctors remove all polyps and test them.   PREVENTION There is not one sure way to prevent polyps. You might be able to lower your risk of getting them if you:  Eat more fruits and vegetables and less fatty food.   Do not smoke.   Avoid alcohol.   Exercise every day.   Lose weight if you are overweight.  Eating more calcium and folate can also lower your risk of getting polyps. Some foods that are rich in calcium are milk, cheese, and broccoli. Some foods that are rich in folate are chickpeas, kidney beans, and spinach.

## 2017-09-18 ENCOUNTER — Telehealth: Payer: Self-pay

## 2017-09-18 ENCOUNTER — Other Ambulatory Visit: Payer: Self-pay

## 2017-09-18 DIAGNOSIS — Z800 Family history of malignant neoplasm of digestive organs: Secondary | ICD-10-CM

## 2017-09-18 NOTE — Telephone Encounter (Signed)
Dr. Oneida Alar called office yesterday, pt wasn't cleaned out for TCS d/t she vomited Tri-Lyte. Dr. Oneida Alar wants pt to have Clenpiq sample. Called pt, TCS scheduled for 11/08/17 at 2:00pm. She will come by office to pick up Clenpiq sample and instructions. Orders entered.

## 2017-09-20 ENCOUNTER — Encounter (HOSPITAL_COMMUNITY): Payer: Self-pay | Admitting: Gastroenterology

## 2017-10-01 ENCOUNTER — Other Ambulatory Visit: Payer: Self-pay | Admitting: Nurse Practitioner

## 2017-10-01 MED FILL — !ELIQUIS 5MG TABLET: 5 | 30 days supply | Qty: 60 | Fill #0

## 2017-10-02 ENCOUNTER — Telehealth: Payer: Self-pay | Admitting: Nurse Practitioner

## 2017-10-02 NOTE — Telephone Encounter (Signed)
Will route to PCP 

## 2017-10-02 NOTE — Telephone Encounter (Signed)
Pt's mom came by to drop off a dsablility place card application and it was placed in PCP inbox , please follow up when available for pick up.

## 2017-10-03 NOTE — Telephone Encounter (Signed)
CMA inform patient mother's that paperwork is ready for pick up.

## 2017-10-24 DIAGNOSIS — I2699 Other pulmonary embolism without acute cor pulmonale: Secondary | ICD-10-CM

## 2017-10-31 ENCOUNTER — Ambulatory Visit (INDEPENDENT_AMBULATORY_CARE_PROVIDER_SITE_OTHER): Payer: Self-pay | Admitting: Urology

## 2017-10-31 DIAGNOSIS — N3941 Urge incontinence: Secondary | ICD-10-CM

## 2017-11-01 ENCOUNTER — Telehealth: Payer: Self-pay | Admitting: Nurse Practitioner

## 2017-11-01 MED FILL — !ELIQUIS 5MG TABLET: 5 | 30 days supply | Qty: 60 | Fill #1

## 2017-11-01 NOTE — Telephone Encounter (Signed)
Patient called and wanted to know about [facility]

## 2017-11-01 NOTE — Telephone Encounter (Signed)
I sent the referral back to [facility] .

## 2017-11-01 NOTE — Telephone Encounter (Signed)
Called pt to remind her to pick up Clenpiq sample and instructions. TCS scheduled for 11/08/17. She is aware to hold Eliquis for 2 days prior to procedure.

## 2017-11-05 ENCOUNTER — Encounter: Payer: Self-pay | Admitting: Nurse Practitioner

## 2017-11-05 NOTE — Progress Notes (Signed)
Office visit with Urology on 10-31-2017 Dx. Urge incontinence Started on Myrbetriq 25 mg XR 1 tablet daily.

## 2017-11-07 ENCOUNTER — Ambulatory Visit: Payer: Self-pay | Attending: Nurse Practitioner

## 2017-11-08 ENCOUNTER — Ambulatory Visit (HOSPITAL_COMMUNITY)
Admission: RE | Admit: 2017-11-08 | Discharge: 2017-11-08 | Disposition: A | Discharge status: Home / Self Care | Payer: Self-pay | Source: Ambulatory Visit | Attending: Gastroenterology | Admitting: Gastroenterology

## 2017-11-08 ENCOUNTER — Other Ambulatory Visit: Payer: Self-pay

## 2017-11-08 ENCOUNTER — Encounter (HOSPITAL_COMMUNITY): Payer: Self-pay | Admitting: *Deleted

## 2017-11-08 ENCOUNTER — Encounter (HOSPITAL_COMMUNITY)
Admission: RE | Disposition: A | Discharge status: Home / Self Care | Payer: Self-pay | Source: Ambulatory Visit | Attending: Gastroenterology

## 2017-11-08 DIAGNOSIS — Z1211 Encounter for screening for malignant neoplasm of colon: Secondary | ICD-10-CM

## 2017-11-08 DIAGNOSIS — F419 Anxiety disorder, unspecified: Secondary | ICD-10-CM | POA: Insufficient documentation

## 2017-11-08 DIAGNOSIS — Z7901 Long term (current) use of anticoagulants: Secondary | ICD-10-CM | POA: Insufficient documentation

## 2017-11-08 DIAGNOSIS — D123 Benign neoplasm of transverse colon: Secondary | ICD-10-CM

## 2017-11-08 DIAGNOSIS — Z86711 Personal history of pulmonary embolism: Secondary | ICD-10-CM | POA: Insufficient documentation

## 2017-11-08 DIAGNOSIS — D122 Benign neoplasm of ascending colon: Secondary | ICD-10-CM

## 2017-11-08 DIAGNOSIS — Z800 Family history of malignant neoplasm of digestive organs: Secondary | ICD-10-CM

## 2017-11-08 DIAGNOSIS — K644 Residual hemorrhoidal skin tags: Secondary | ICD-10-CM | POA: Insufficient documentation

## 2017-11-08 DIAGNOSIS — Q438 Other specified congenital malformations of intestine: Secondary | ICD-10-CM | POA: Insufficient documentation

## 2017-11-08 DIAGNOSIS — K648 Other hemorrhoids: Secondary | ICD-10-CM | POA: Insufficient documentation

## 2017-11-08 DIAGNOSIS — F329 Major depressive disorder, single episode, unspecified: Secondary | ICD-10-CM | POA: Insufficient documentation

## 2017-11-08 HISTORY — PX: COLONOSCOPY: SHX5424

## 2017-11-08 HISTORY — PX: POLYPECTOMY: SHX5525

## 2017-11-08 SURGERY — COLONOSCOPY
Anesthesia: Moderate Sedation

## 2017-11-08 MED ORDER — MEPERIDINE HCL 100 MG/ML IJ SOLN
INTRAMUSCULAR | Status: DC | PRN
  Administered 2017-11-08 (×3): 25 mg via INTRAVENOUS

## 2017-11-08 MED ORDER — STERILE WATER FOR IRRIGATION IR SOLN
Status: DC | PRN
  Administered 2017-11-08: 1.5 mL

## 2017-11-08 MED ORDER — MIDAZOLAM HCL 5 MG/5ML IJ SOLN
INTRAMUSCULAR | Status: DC | PRN
  Administered 2017-11-08 (×2): 2 mg via INTRAVENOUS
  Administered 2017-11-08 (×2): 1 mg via INTRAVENOUS

## 2017-11-08 MED ORDER — MEPERIDINE HCL 100 MG/ML IJ SOLN
INTRAMUSCULAR | Status: AC
  Filled 2017-11-08: qty 2

## 2017-11-08 MED ORDER — SODIUM CHLORIDE 0.9 % IV SOLN
INTRAVENOUS | Status: DC
  Administered 2017-11-08: 14:00:00 via INTRAVENOUS

## 2017-11-08 MED ORDER — MIDAZOLAM HCL 5 MG/5ML IJ SOLN
INTRAMUSCULAR | Status: AC
  Filled 2017-11-08: qty 10

## 2017-11-08 NOTE — H&P (Signed)
Primary Care Physician:  Gildardo Pounds, NP Primary Gastroenterologist:  Dr. Oneida Alar  Pre-Procedure History & Physical: HPI:  [name] is a 52 y.o. female here for screening: FAMILY Hx COLON CA-FATHER HAD COLON CA AGE < 16.   Past Medical History:  Diagnosis Date  . Anemia   . Anxiety   . Asthma    hx as child - no prob as adult - no inhaler  . Blood transfusion 01/22/11   transfusion 2 units at [facility]  . Depression 08/2010   psych assessment  . Dyspnea   . Fibroid   . Headache(784.0)    rx for imitrex - last one jan  . Keloid   . Pulmonary embolism [facility])     Past Surgical History:  Procedure Laterality Date  . ABDOMINAL HYSTERECTOMY    . FLEXIBLE SIGMOIDOSCOPY N/A 09/17/2017   Procedure: FLEXIBLE SIGMOIDOSCOPY;  Surgeon: Danie Binder, MD;  Location: [facility];  Service: Endoscopy;  Laterality: N/A;  . HERNIA REPAIR  [id number]   umbilical  . SVD     Spontaneous vaginal delivery; x 1    Prior to Admission medications   Medication Sig Start Date End Date Taking? Authorizing Provider  acetaminophen (TYLENOL) 500 MG tablet Take 1,500 mg by mouth every 6 (six) hours as needed for moderate pain.   Yes [provider]  ELIQUIS 5 MG TABS tablet TAKE 1 TABLET BY MOUTH 2 TIMES DAILY. 10/01/17  Yes Charlott Rakes, MD  furosemide (LASIX) 20 MG tablet Take 1 tablet (20 mg total) by mouth daily. 03/09/17  Yes Ena Dawley, Tiffany S, PA-C  mirabegron ER (MYRBETRIQ) 25 MG TB24 tablet Take 25 mg by mouth daily.   Yes [provider]  Vitamins A & D (VITAMIN A & D) ointment Apply 1 application topically daily as needed for dry skin.   Yes [provider]  oxybutynin (DITROPAN XL) 10 MG 24 hr tablet Take 1 tablet (10 mg total) by mouth at bedtime. Patient not taking: Reported on 11/05/2017 09/10/17   Gildardo Pounds, NP    Allergies as of 09/18/2017 - Review Complete 09/17/2017  Allergen Reaction Noted  . Bee venom Anaphylaxis 09/14/2017  . Augmentin  [amoxicillin-pot clavulanate] Hives, Itching, and Other (See Comments) 11/22/2011  . Latex Hives and Other (See Comments) 02/22/2011  . Nulytely [peg [id number]-na bicarb-nacl]  09/17/2017    Family History  Problem Relation Age of Onset  . Heart disease Mother   . Hypertension Mother   . Hypertension Father   . Colon cancer Father 100       Passed away 20 yrs old  . Hypertension Sister   . Cancer Maternal Grandmother        gastric cancer  . Cancer Maternal Grandfather        pancreatic cancer  . Colon cancer Paternal Grandmother   . Colon cancer Paternal Uncle   . Colon polyps Neg Hx     Social History   Socioeconomic History  . Marital status: Single    Spouse name: Not on file  . Number of children: 1  . Years of education: Not on file  . Highest education level: Not on file  Occupational History  . Occupation: Interior and spatial designer: [employer]  Social Needs  . Financial resource strain: Not on file  . Food insecurity:    Worry: Not on file    Inability: Not on file  . Transportation needs:    Medical: Not on  file    Non-medical: Not on file  Tobacco Use  . Smoking status: Never Smoker  . Smokeless tobacco: Never Used  Substance and Sexual Activity  . Alcohol use: No  . Drug use: No  . Sexual activity: Not Currently    Birth control/protection: None, Surgical  Lifestyle  . Physical activity:    Days per week: 7 days    Minutes per session: 30 min  . Stress: Only a little  Relationships  . Social connections:    Talks on phone: More than three times a week    Gets together: Twice a week    Attends religious service: More than 4 times per year    Active member of club or organization: No    Attends meetings of clubs or organizations: Never    Relationship status: Never married  . Intimate partner violence:    Fear of current or ex partner: No    Emotionally abused: No    Physically abused: No    Forced sexual activity: No  Other Topics Concern  . Not  on file  Social History Narrative  . Not on file    Review of Systems: See HPI, otherwise negative ROS   Physical Exam: BP (!) 149/90   Pulse 82   Temp 99 F (37.2 C) (Oral)   Resp 18   Ht 6' (1.829 m)   Wt 121.1 kg   LMP 01/31/2011   SpO2 100%   BMI 36.21 kg/m  General:   Alert,  pleasant and cooperative in NAD Head:  Normocephalic and atraumatic. Neck:  Supple; Lungs:  Clear throughout to auscultation.    Heart:  Regular rate and rhythm. Abdomen:  Soft, nontender and nondistended. Normal bowel sounds, without guarding, and without rebound.   Neurologic:  Alert and  oriented x4;  grossly normal neurologically.  Impression/Plan:    SCREENING  Plan:  1. TCS TODAY DISCUSSED PROCEDURE, BENEFITS, & RISKS: < 1% chance of medication reaction, bleeding, perforation, or rupture of spleen/liver.

## 2017-11-08 NOTE — Op Note (Signed)
[facility] Patient Name: [name] Procedure Date: 11/08/2017 2:32 PM MRN: [id number] Date of Birth: October 27, 1965 Attending MD: Barney Drain MD, MD CSN: [id number] Age: 52 Admit Type: Outpatient Procedure:                Colonoscopy WITH COLD SNARE/SNARE CAUTERY                            POLYPECTOMY Indications:              Screening in patient at increased risk: Colorectal                            cancer in father before age 38 Providers:                Barney Drain MD, MD, Charlsie Quest. Theda Sers RN, RN,                            Nelma Rothman, Technician Referring MD:             Gildardo Pounds Medicines:                Meperidine 75 mg IV, Midazolam 6 mg IV Complications:            No immediate complications. Estimated Blood Loss:     Estimated blood loss was minimal. Procedure:                Pre-Anesthesia Assessment:                           - Prior to the procedure, a History and Physical                            was performed, and patient medications and                            allergies were reviewed. The patient's tolerance of                            previous anesthesia was also reviewed. The risks                            and benefits of the procedure and the sedation                            options and risks were discussed with the patient.                            All questions were answered, and informed consent                            was obtained. Prior Anticoagulants: The patient has                            taken Eliquis (apixaban), last dose was 7 days  prior to procedure. ASA Grade Assessment: II - A                            patient with mild systemic disease. After reviewing                            the risks and benefits, the patient was deemed in                            satisfactory condition to undergo the procedure.                            After obtaining informed consent, the colonoscope                         was passed under direct vision. Throughout the                            procedure, the patient's blood pressure, pulse, and                            oxygen saturations were monitored continuously. The                            CF-HQ190L ([id number]) scope was introduced through                            the anus and advanced to the the cecum, identified                            by appendiceal orifice and ileocecal valve. The                            colonoscopy was somewhat difficult due to a                            tortuous colon. Successful completion of the                            procedure was aided by increasing the dose of                            sedation medication, straightening and shortening                            the scope to obtain bowel loop reduction and                            COLOWRAP. The patient tolerated the procedure well.                            The quality of the bowel preparation was good. The  ileocecal valve, appendiceal orifice, and rectum                            were photographed. Scope In: 2:59:02 PM Scope Out: 3:20:08 PM Scope Withdrawal Time: 0 hours 14 minutes 13 seconds  Total Procedure Duration: 0 hours 21 minutes 6 seconds  Findings:      Two sessile polyps were found in the transverse colon and ascending       colon. The polyps were 3 to 4 mm in size. These polyps were removed with       a cold snare. Resection and retrieval were complete.      A 10 mm polyp was found in the splenic flexure. The polyp was       pedunculated. The polyp was removed with a hot snare. Resection and       retrieval were complete.      The recto-sigmoid colon, sigmoid colon and descending colon were       moderately redundant.      External hemorrhoids were found. The hemorrhoids were small. Impression:               - Two 3 to 4 mm polyps in the transverse colon and                            in  the ascending colon, removed with a cold snare.                            Resected and retrieved.                           - One 10 mm polyp at the splenic flexure, removed                            with a hot snare. Resected and retrieved.                           - Redundant colon.                           - External and internal hemorrhoids                           . Moderate Sedation:      Moderate (conscious) sedation was administered by the endoscopy nurse       and supervised by the endoscopist. The following parameters were       monitored: oxygen saturation, heart rate, blood pressure, and response       to care. Total physician intraservice time was 35 minutes. Recommendation:           - Patient has a contact number available for                            emergencies. The signs and symptoms of potential                            delayed complications were discussed with the  patient. Return to normal activities tomorrow.                            Written discharge instructions were provided to the                            patient.                           - High fiber diet.                           - Continue present medications.                           - Await pathology results.                           - Repeat colonoscopy in 3 years for surveillance. Procedure Code(s):        --- Professional ---                           [phone], Colonoscopy, flexible; with removal of                            tumor(s), polyp(s), or other lesion(s) by snare                            technique                           G0500, Moderate sedation services provided by the                            same physician or other qualified health care                            professional performing a gastrointestinal                            endoscopic service that sedation supports,                            requiring the presence of an independent  trained                            observer to assist in the monitoring of the                            patient's level of consciousness and physiological                            status; initial 15 minutes of intra-service time;                            patient age 37 years or older (additional time  may                            be reported with [phone], as appropriate)                           [phone], Moderate sedation services provided by the                            same physician or other qualified health care                            professional performing the diagnostic or                            therapeutic service that the sedation supports,                            requiring the presence of an independent trained                            observer to assist in the monitoring of the                            patient's level of consciousness and physiological                            status; each additional 15 minutes intraservice                            time (List separately in addition to code for                            primary service) Diagnosis Code(s):        --- Professional ---                           Z80.0, Family history of malignant neoplasm of                            digestive organs                           D12.3, Benign neoplasm of transverse colon (hepatic                            flexure or splenic flexure)                           D12.2, Benign neoplasm of ascending colon                           K64.8, Other hemorrhoids                           Q43.8, Other specified congenital malformations of  intestine CPT copyright 2017 [employer]. All rights reserved. The codes documented in this report are preliminary and upon coder review may  be revised to meet current compliance requirements. Barney Drain, MD Barney Drain MD, MD 11/08/2017 3:30:25 PM This report has been signed  electronically. Number of Addenda: 0

## 2017-11-08 NOTE — Discharge Instructions (Signed)
You had 3 polyps removed. One was about 1 cm. You have SMALL internal and external hemorrhoids.  HOLD ELIQUIS. RE-START SEP 13.  DRINK WATER TO KEEP YOUR URINE LIGHT YELLOW.  FOLLOW A HIGH FIBER DIET. AVOID ITEMS THAT CAUSE BLOATING & GAS. SEE INFO BELOW.  YOUR BIOPSY RESULTS WILL BE AVAILABLE IN 7 DAYS.  Next colonoscopy in 3 years. YOUR SISTERS, BROTHERS, CHILDREN, AND PARENTS NEED TO HAVE A COLONOSCOPY STARTING AT THE AGE OF 40.    Colonoscopy Care After Read the instructions outlined below and refer to this sheet in the next week. These discharge instructions provide you with general information on caring for yourself after you leave the hospital. While your treatment has been planned according to the most current medical practices available, unavoidable complications occasionally occur. If you have any problems or questions after discharge, call DR. Montague Corella, [phone].  ACTIVITY  You may resume your regular activity, but move at a slower pace for the next 24 hours.   Take frequent rest periods for the next 24 hours.   Walking will help get rid of the air and reduce the bloated feeling in your belly (abdomen).   No driving for 24 hours (because of the medicine (anesthesia) used during the test).   You may shower.   Do not sign any important legal documents or operate any machinery for 24 hours (because of the anesthesia used during the test).    NUTRITION  Drink plenty of fluids.   You may resume your normal diet as instructed by your doctor.   Begin with a light meal and progress to your normal diet. Heavy or fried foods are harder to digest and may make you feel sick to your stomach (nauseated).   Avoid alcoholic beverages for 24 hours or as instructed.    MEDICATIONS  You may resume your normal medications.   WHAT YOU CAN EXPECT TODAY  Some feelings of bloating in the abdomen.   Passage of more gas than usual.   Spotting of blood in your stool or on the  toilet paper  .  IF YOU HAD POLYPS REMOVED DURING THE COLONOSCOPY:  Eat a soft diet IF YOU HAVE NAUSEA, BLOATING, ABDOMINAL PAIN, OR VOMITING.    FINDING OUT THE RESULTS OF YOUR TEST Not all test results are available during your visit. DR. Oneida Alar WILL CALL YOU WITHIN 14 DAYS OF YOUR PROCEDUE WITH YOUR RESULTS. Do not assume everything is normal if you have not heard from DR. Jordyn Hofacker, CALL HER OFFICE AT [phone].  SEEK IMMEDIATE MEDICAL ATTENTION AND CALL THE OFFICE: [phone] IF:  You have more than a spotting of blood in your stool.   Your belly is swollen (abdominal distention).   You are nauseated or vomiting.   You have a temperature over 101F.   You have abdominal pain or discomfort that is severe or gets worse throughout the day.   High-Fiber Diet A high-fiber diet changes your normal diet to include more whole grains, legumes, fruits, and vegetables. Changes in the diet involve replacing refined carbohydrates with unrefined foods. The calorie level of the diet is essentially unchanged. The Dietary Reference Intake (recommended amount) for adult males is 38 grams per day. For adult females, it is 25 grams per day. Pregnant and lactating women should consume 28 grams of fiber per day. Fiber is the intact part of a plant that is not broken down during digestion. Functional fiber is fiber that has been isolated from the plant to provide a  beneficial effect in the body. PURPOSE  Increase stool bulk.   Ease and regulate bowel movements.   Lower cholesterol.   REDUCE RISK OF COLON CANCER  INDICATIONS THAT YOU NEED MORE FIBER  Constipation and hemorrhoids.   Uncomplicated diverticulosis (intestine condition) and irritable bowel syndrome.   Weight management.   As a protective measure against hardening of the arteries (atherosclerosis), diabetes, and cancer.   GUIDELINES FOR INCREASING FIBER IN THE DIET  Start adding fiber to the diet slowly. A gradual increase  of about 5 more grams (2 slices of whole-wheat bread, 2 servings of most fruits or vegetables, or 1 bowl of high-fiber cereal) per day is best. Too rapid an increase in fiber may result in constipation, flatulence, and bloating.   Drink enough water and fluids to keep your urine clear or pale yellow. Water, juice, or caffeine-free drinks are recommended. Not drinking enough fluid may cause constipation.   Eat a variety of high-fiber foods rather than one type of fiber.   Try to increase your intake of fiber through using high-fiber foods rather than fiber pills or supplements that contain small amounts of fiber.   The goal is to change the types of food eaten. Do not supplement your present diet with high-fiber foods, but replace foods in your present diet.   INCLUDE A VARIETY OF FIBER SOURCES  Replace refined and processed grains with whole grains, canned fruits with fresh fruits, and incorporate other fiber sources. White rice, white breads, and most bakery goods contain little or no fiber.   Brown whole-grain rice, buckwheat oats, and many fruits and vegetables are all good sources of fiber. These include: broccoli, Brussels sprouts, cabbage, cauliflower, beets, sweet potatoes, white potatoes (skin on), carrots, tomatoes, eggplant, squash, berries, fresh fruits, and dried fruits.   Cereals appear to be the richest source of fiber. Cereal fiber is found in whole grains and bran. Bran is the fiber-rich outer coat of cereal grain, which is largely removed in refining. In whole-grain cereals, the bran remains. In breakfast cereals, the largest amount of fiber is found in those with "bran" in their names. The fiber content is sometimes indicated on the label.   You may need to include additional fruits and vegetables each day.   In baking, for 1 cup white flour, you may use the following substitutions:   1 cup whole-wheat flour minus 2 tablespoons.   1/2 cup white flour plus 1/2 cup whole-wheat  flour.   Polyps, Colon  A polyp is extra tissue that grows inside your body. Colon polyps grow in the large intestine. The large intestine, also called the colon, is part of your digestive system. It is a long, hollow tube at the end of your digestive tract where your body makes and stores stool. Most polyps are not dangerous. They are benign. This means they are not cancerous. But over time, some types of polyps can turn into cancer. Polyps that are smaller than a pea are usually not harmful. But larger polyps could someday become or may already be cancerous. To be safe, doctors remove all polyps and test them.   WHO GETS POLYPS? Anyone can get polyps, but certain people are more likely than others. You may have a greater chance of getting polyps if:  You are over 50.   You have had polyps before.   Someone in your family has had polyps.   Someone in your family has had cancer of the large intestine.   Find out  if someone in your family has had polyps. You may also be more likely to get polyps if you:   Eat a lot of fatty foods   Smoke   Drink alcohol   Do not exercise  Eat too much   PREVENTION There is not one sure way to prevent polyps. You might be able to lower your risk of getting them if you:  Eat more fruits and vegetables and less fatty food.   Do not smoke.   Avoid alcohol.   Exercise every day.   Lose weight if you are overweight.   Eating more calcium and folate can also lower your risk of getting polyps. Some foods that are rich in calcium are milk, cheese, and broccoli. Some foods that are rich in folate are chickpeas, kidney beans, and spinach.

## 2017-11-14 ENCOUNTER — Encounter (HOSPITAL_COMMUNITY): Payer: Self-pay | Admitting: Gastroenterology

## 2017-11-14 ENCOUNTER — Ambulatory Visit: Payer: Self-pay | Admitting: Physical Therapy

## 2017-11-15 NOTE — Progress Notes (Signed)
LM for a return call.  

## 2017-11-15 NOTE — Progress Notes (Signed)
Letter mailed to pt to call for results.  

## 2017-11-15 NOTE — Progress Notes (Signed)
CC'D TO PCP AND ON RECALL  °

## 2017-11-29 ENCOUNTER — Telehealth: Payer: Self-pay

## 2017-11-29 NOTE — Telephone Encounter (Signed)
Pt received a letter to call for results and was informed of the results from 11/08/2017.

## 2017-12-07 MED FILL — !ELIQUIS 5MG TABLET: 5 | 30 days supply | Qty: 60 | Fill #2

## 2017-12-11 ENCOUNTER — Ambulatory Visit: Payer: Self-pay | Admitting: Nurse Practitioner

## 2017-12-14 ENCOUNTER — Encounter (HOSPITAL_COMMUNITY): Payer: Self-pay | Admitting: *Deleted

## 2017-12-14 ENCOUNTER — Emergency Department (HOSPITAL_COMMUNITY): Payer: Self-pay

## 2017-12-14 ENCOUNTER — Other Ambulatory Visit: Payer: Self-pay

## 2017-12-14 ENCOUNTER — Emergency Department (HOSPITAL_COMMUNITY)
Admission: EM | Admit: 2017-12-14 | Discharge: 2017-12-15 | Disposition: A | Discharge status: Home / Self Care | Payer: Self-pay | Attending: Emergency Medicine | Admitting: Emergency Medicine

## 2017-12-14 DIAGNOSIS — F419 Anxiety disorder, unspecified: Secondary | ICD-10-CM | POA: Insufficient documentation

## 2017-12-14 DIAGNOSIS — Z91040 Latex allergy status: Secondary | ICD-10-CM | POA: Insufficient documentation

## 2017-12-14 DIAGNOSIS — R0789 Other chest pain: Secondary | ICD-10-CM | POA: Insufficient documentation

## 2017-12-14 DIAGNOSIS — M79661 Pain in right lower leg: Secondary | ICD-10-CM | POA: Insufficient documentation

## 2017-12-14 DIAGNOSIS — F329 Major depressive disorder, single episode, unspecified: Secondary | ICD-10-CM | POA: Insufficient documentation

## 2017-12-14 DIAGNOSIS — Z79899 Other long term (current) drug therapy: Secondary | ICD-10-CM | POA: Insufficient documentation

## 2017-12-14 DIAGNOSIS — Z7901 Long term (current) use of anticoagulants: Secondary | ICD-10-CM | POA: Insufficient documentation

## 2017-12-14 DIAGNOSIS — M79604 Pain in right leg: Secondary | ICD-10-CM

## 2017-12-14 DIAGNOSIS — J45909 Unspecified asthma, uncomplicated: Secondary | ICD-10-CM | POA: Insufficient documentation

## 2017-12-14 LAB — CBC WITH DIFFERENTIAL/PLATELET
Abs Immature Granulocytes: 0.02 10*3/uL (ref 0.00–0.07)
Basophils Absolute: 0 10*3/uL (ref 0.0–0.1)
Basophils Relative: 0 %
Eosinophils Absolute: 0.3 10*3/uL (ref 0.0–0.5)
Eosinophils Relative: 6 %
HCT: 32.6 % — ABNORMAL LOW (ref 36.0–46.0)
Hemoglobin: 10 g/dL — ABNORMAL LOW (ref 12.0–15.0)
Immature Granulocytes: 0 %
Lymphocytes Relative: 33 %
Lymphs Abs: 1.8 10*3/uL (ref 0.7–4.0)
MCH: 28.1 pg (ref 26.0–34.0)
MCHC: 30.7 g/dL (ref 30.0–36.0)
MCV: 91.6 fL (ref 80.0–100.0)
Monocytes Absolute: 0.6 10*3/uL (ref 0.1–1.0)
Monocytes Relative: 10 %
Neutro Abs: 2.7 10*3/uL (ref 1.7–7.7)
Neutrophils Relative %: 51 %
Platelets: 311 10*3/uL (ref 150–400)
RBC: 3.56 MIL/uL — ABNORMAL LOW (ref 3.87–5.11)
RDW: 14.5 % (ref 11.5–15.5)
WBC: 5.4 10*3/uL (ref 4.0–10.5)
nRBC: 0 % (ref 0.0–0.2)

## 2017-12-14 LAB — BASIC METABOLIC PANEL
Anion gap: 4 — ABNORMAL LOW (ref 5–15)
BUN: 15 mg/dL (ref 6–20)
CO2: 26 mmol/L (ref 22–32)
Calcium: 9.1 mg/dL (ref 8.9–10.3)
Chloride: 112 mmol/L — ABNORMAL HIGH (ref 98–111)
Creatinine, Ser: 0.83 mg/dL (ref 0.44–1.00)
GFR calc Af Amer: >60 mL/min (ref >60)
GFR calc non Af Amer: >60 mL/min (ref >60)
Glucose, Bld: 100 mg/dL — ABNORMAL HIGH (ref 70–99)
Potassium: 3.7 mmol/L (ref 3.5–5.1)
Sodium: 142 mmol/L (ref 135–145)

## 2017-12-14 LAB — TROPONIN I: Troponin I: <0.03 ng/mL (ref <0.03)

## 2017-12-14 MED ORDER — APIXABAN 5 MG PO TABS
5.0000 mg | ORAL_TABLET | Freq: Two times a day (BID) | ORAL | Status: DC
  Administered 2017-12-14: 5 mg via ORAL
  Filled 2017-12-14: qty 1

## 2017-12-14 MED ORDER — IOPAMIDOL (ISOVUE-370) INJECTION 76%
100.0000 mL | Freq: Once | INTRAVENOUS | Status: AC | PRN
  Administered 2017-12-14: 100 mL via INTRAVENOUS

## 2017-12-14 MED ORDER — IOPAMIDOL (ISOVUE-370) INJECTION 76%
INTRAVENOUS | Status: AC
  Filled 2017-12-14: qty 100

## 2017-12-14 MED ORDER — SODIUM CHLORIDE 0.9 % IJ SOLN
INTRAMUSCULAR | Status: AC
  Filled 2017-12-14: qty 50

## 2017-12-14 NOTE — ED Provider Notes (Signed)
[facility] DEPT Provider Note   CSN: [id number] Arrival date & time: 12/14/17  1928     History   Chief Complaint Chief Complaint  Patient presents with  . Leg Pain    HPI [name] is a 52 y.o. female who presents with R calf pain. PMH significant for hx of PE on chronic anticoagulation. She states that she ran out of Eliquis one week ago and wasn't able to get a refill until today. She noticed R calf pain 5 days ago. She initially ignored it but it has been progressively worsening. She denies any injury to the area. She has chronic bilateral lower extremity edema and that has actually improved. She also reports she started to have substernal chest pain that started a couple days ago. It is constant and non-radiating. It feels like indigestion but also feels like when she had a PE in the past. No fever, cough, SOB.  HPI  Past Medical History:  Diagnosis Date  . Anemia   . Anxiety   . Asthma    hx as child - no prob as adult - no inhaler  . Blood transfusion 01/22/11   transfusion 2 units at [facility]  . Depression 08/2010   psych assessment  . Dyspnea   . Fibroid   . Headache(784.0)    rx for imitrex - last one jan  . Keloid   . Pulmonary embolism [facility])     Patient Active Problem List   Diagnosis Date Noted  . Adenopathy 09/15/2017  . Family history of colon cancer 08/01/2017  . Taking multiple medications for chronic disease 08/01/2017  . Arthritis   . PE (pulmonary thromboembolism) ([place]) 01/26/2017  . Axillary lymphadenopathy 01/26/2017  . Urinary incontinence 01/25/2017  . Hypokalemia 01/25/2017  . Lobar pneumonia ([place]) 01/25/2017  . Pressure ulcer of sacral region, stage 2 ([place]) 01/25/2017  . Abnormal ECG 01/29/2013  . Dyspnea   . Menorrhagia with regular cycle 02/22/2011  . Anemia 02/22/2011    Past Surgical History:  Procedure Laterality Date  . ABDOMINAL HYSTERECTOMY    . COLONOSCOPY N/A 11/08/2017   Procedure:  COLONOSCOPY;  Surgeon: Danie Binder, MD;  Location: [facility] ENDO SUITE;  Service: Endoscopy;  Laterality: N/A;  2:00pm  . FLEXIBLE SIGMOIDOSCOPY N/A 09/17/2017   Procedure: FLEXIBLE SIGMOIDOSCOPY;  Surgeon: Danie Binder, MD;  Location: [facility] ENDO SUITE;  Service: Endoscopy;  Laterality: N/A;  . HERNIA REPAIR  3220   umbilical  . POLYPECTOMY  11/08/2017   Procedure: POLYPECTOMY;  Surgeon: Danie Binder, MD;  Location: [facility] ENDO SUITE;  Service: Endoscopy;;  ascending colon (CSx1), transverse colon (CS x1), splenic flexure (HSx1)  . SVD     Spontaneous vaginal delivery; x 1     OB History    Gravida  3   Para  1   Term  1   Preterm      AB  2   Living  1     SAB  1   TAB  1   Ectopic      Multiple      Live Births               Home Medications    Prior to Admission medications   Medication Sig Start Date End Date Taking? Authorizing Provider  acetaminophen (TYLENOL) 500 MG tablet Take 1,500 mg by mouth every 6 (six) hours as needed for moderate pain.   Yes [provider]  ELIQUIS 5 MG  TABS tablet TAKE 1 TABLET BY MOUTH 2 TIMES DAILY. 10/01/17  Yes Charlott Rakes, MD  furosemide (LASIX) 20 MG tablet Take 1 tablet (20 mg total) by mouth daily. 03/09/17  Yes Ena Dawley, Tiffany S, PA-C  mirabegron ER (MYRBETRIQ) 25 MG TB24 tablet Take 25 mg by mouth daily.   Yes [provider]  Vitamins A & D (VITAMIN A & D) ointment Apply 1 application topically daily as needed for dry skin.   Yes [provider]  oxybutynin (DITROPAN XL) 10 MG 24 hr tablet Take 1 tablet (10 mg total) by mouth at bedtime. Patient not taking: Reported on 11/05/2017 09/10/17   Gildardo Pounds, NP    Family History Family History  Problem Relation Age of Onset  . Heart disease Mother   . Hypertension Mother   . Hypertension Father   . Colon cancer Father 25       Passed away 56 yrs old  . Hypertension Sister   . Cancer Maternal Grandmother        gastric cancer  . Cancer  Maternal Grandfather        pancreatic cancer  . Colon cancer Paternal Grandmother   . Colon cancer Paternal Uncle   . Colon polyps Neg Hx     Social History Social History   Tobacco Use  . Smoking status: Never Smoker  . Smokeless tobacco: Never Used  Substance Use Topics  . Alcohol use: No  . Drug use: No     Allergies   Bee venom; Augmentin [amoxicillin-pot clavulanate]; Latex; and Nulytely [peg [id number]-[id number]-AJ bicarb-nacl]   Review of Systems Review of Systems  Constitutional: Negative for fever.  Respiratory: Negative for cough and shortness of breath.   Cardiovascular: Positive for chest pain and leg swelling.  Musculoskeletal:       +calf pain  Neurological: Negative for syncope.  All other systems reviewed and are negative.    Physical Exam Updated Vital Signs BP (!) 153/86 (BP Location: Left Arm)   Pulse 85   Temp 98.7 F (37.1 C) (Oral)   Resp 13   Ht 6' (1.829 m)   Wt 121.1 kg   LMP 01/31/2011   SpO2 100%   BMI 36.21 kg/m   Physical Exam  Constitutional: She is oriented to person, place, and time. She appears well-developed and well-nourished. No distress.  Calm, cooperative  HENT:  Head: Normocephalic and atraumatic.  Eyes: Pupils are equal, round, and reactive to light. Conjunctivae are normal. Right eye exhibits no discharge. Left eye exhibits no discharge. No scleral icterus.  Neck: Normal range of motion.  Cardiovascular: Normal rate and regular rhythm.  Pulmonary/Chest: Effort normal and breath sounds normal. No respiratory distress.  Abdominal: She exhibits no distension.  Musculoskeletal:  Significant severe bilateral lower extremity edema which is chronic. Right calf tenderness. No skin changes  Neurological: She is alert and oriented to person, place, and time.  Skin: Skin is warm and dry.  Psychiatric: She has a normal mood and affect. Her behavior is normal.  Nursing note and vitals reviewed.    ED Treatments / Results   Labs (all labs ordered are listed, but only abnormal results are displayed) Labs Reviewed  BASIC METABOLIC PANEL  CBC WITH DIFFERENTIAL/PLATELET  TROPONIN I    EKG None  Radiology No results found.  Procedures Procedures (including critical care time)  Medications Ordered in ED Medications - No data to display   Initial Impression / Assessment and Plan / ED Course  I  have reviewed the triage vital signs and the nursing notes.  Pertinent labs & imaging results that were available during my care of the patient were reviewed by me and considered in my medical decision making (see chart for details).  52 year old female presents with R calf pain and new chest pain and has been off anticoagulation for one week. She is hypertensive but otherwise vitals are normal and she is not in any distress. She has tenderness of the right calf. Rest of exam is unremarkable. We do not have [place] available at this time. Due to her complaints of chest pain and hx of PE will expand work up and obtain labs, EKG, CTA to r/o PE. At shift change these studies are pending. Care transferred to [facility] PA-C who will dispo. If positive for PE - admit. If negative, have her come back in the morning for DVT study and anticoagulate.  Final Clinical Impressions(s) / ED Diagnoses   Final diagnoses:  Right leg pain    ED Discharge Orders    None       Recardo Evangelist, PA-C 12/15/17 1149    Malvin Johns, MD 12/16/17 1500

## 2017-12-14 NOTE — ED Provider Notes (Signed)
23:00: Assumed care from Janetta Hora PA-C at change of shift pending CTA and remaining labs. If no pulmonary embolism identified plan for discharge home with venous duplex study in the AM  Please see prior provider note for full H&P.  Briefly patient is a 52 year old female with a history of prior PE on chronic anticoagulation who presented to the emergency department with right calf pain for the past 5 days with mention of some shortness of breath as well. She had run out of her Eliquis and was unable to take this for 1 week, however she did refill this this morning.  Physical Exam  BP (!) 153/86 (BP Location: Left Arm)   Pulse 85   Temp 98.7 F (37.1 C) (Oral)   Resp 13   Ht 6' (1.829 m)   Wt 121.1 kg   LMP 01/31/2011   SpO2 100%   BMI 36.21 kg/m   Physical Exam  Constitutional: She appears well-developed and well-nourished. No distress.  HENT:  Head: Normocephalic and atraumatic.  Eyes: Conjunctivae are normal. Right eye exhibits no discharge. Left eye exhibits no discharge.  Cardiovascular: Normal rate and regular rhythm.  Pulses:      Dorsalis pedis pulses are 2+ on the right side, and 2+ on the left side.  Pulmonary/Chest: Effort normal and breath sounds normal.  Musculoskeletal:  Lower Extremities: No significant erythema or warmth noted.  Pitting edema to bilateral lower extremities.  Exam somewhat limited secondary to body habitus.  Neurological: She is alert.  Clear speech.  Sensation grossly intact bilateral lower extremities.  5 out of 5 strength plantar dorsiflexion bilaterally.  Psychiatric: She has a normal mood and affect. Her behavior is normal. Thought content normal.  Nursing note and vitals reviewed.   ED Course/Procedures    Results for orders placed or performed during the hospital encounter of 78/29/56  Basic metabolic panel  Result Value Ref Range   Sodium 142 135 - 145 mmol/L   Potassium 3.7 3.5 - 5.1 mmol/L   Chloride 112 (H) 98 - 111 mmol/L   CO2  26 22 - 32 mmol/L   Glucose, Bld 100 (H) 70 - 99 mg/dL   BUN 15 6 - 20 mg/dL   Creatinine, Ser 0.83 0.44 - 1.00 mg/dL   Calcium 9.1 8.9 - 10.3 mg/dL   GFR calc non Af Amer >60 >60 mL/min   GFR calc Af Amer >60 >60 mL/min   Anion gap 4 (L) 5 - 15  CBC with Differential  Result Value Ref Range   WBC 5.4 4.0 - 10.5 K/uL   RBC 3.56 (L) 3.87 - 5.11 MIL/uL   Hemoglobin 10.0 (L) 12.0 - 15.0 g/dL   HCT 32.6 (L) 36.0 - 46.0 %   MCV 91.6 80.0 - 100.0 fL   MCH 28.1 26.0 - 34.0 pg   MCHC 30.7 30.0 - 36.0 g/dL   RDW 14.5 11.5 - 15.5 %   Platelets 311 150 - 400 K/uL   nRBC 0.0 0.0 - 0.2 %   Neutrophils Relative % 51 %   Neutro Abs 2.7 1.7 - 7.7 K/uL   Lymphocytes Relative 33 %   Lymphs Abs 1.8 0.7 - 4.0 K/uL   Monocytes Relative 10 %   Monocytes Absolute 0.6 0.1 - 1.0 K/uL   Eosinophils Relative 6 %   Eosinophils Absolute 0.3 0.0 - 0.5 K/uL   Basophils Relative 0 %   Basophils Absolute 0.0 0.0 - 0.1 K/uL   Immature Granulocytes 0 %  Abs Immature Granulocytes 0.02 0.00 - 0.07 K/uL  Troponin I  Result Value Ref Range   Troponin I <0.03 <0.03 ng/mL   Ct Angio Chest Pe W/cm &/or Wo Cm  Result Date: 12/15/2017 CLINICAL DATA:  Chest pain EXAM: CT ANGIOGRAPHY CHEST WITH CONTRAST TECHNIQUE: Multidetector CT imaging of the chest was performed using the standard protocol during bolus administration of intravenous contrast. Multiplanar CT image reconstructions and MIPs were obtained to evaluate the vascular anatomy. CONTRAST:  180mL ISOVUE-370 IOPAMIDOL (ISOVUE-370) INJECTION 76% COMPARISON:  CT 06/29/2017, 01/26/2017 FINDINGS: Cardiovascular: Satisfactory opacification of the pulmonary arteries to the segmental level. No evidence of pulmonary embolism. Nonaneurysmal aorta. No dissection seen. Normal heart size. No pericardial effusion Mediastinum/Nodes: Midline trachea. Subcentimeter hypodense lesion left lobe of thyroid. Bilateral axillary lymph nodes measuring up to 2.3 cm on the left and 19 mm on  the right. No enlarging mediastinal adenopathy. Esophagus within normal limits. Lungs/Pleura: No pleural effusion. Small subpleural foci density in the lingula and left lung base. No pneumothorax. Upper Abdomen: Stable 1 cm hypodense posterior right hepatic lobe lesion. Musculoskeletal: Scoliosis of the spine. No acute or suspicious abnormality. Review of the MIP images confirms the above findings. IMPRESSION: 1. Negative for acute pulmonary embolus or aortic dissection. 2. Similar appearance of bilateral axillary adenopathy 3. Small foci of subpleural density in the left lung base and lingula may reflect small foci of atelectasis or inflammation. Electronically Signed   By: Donavan Foil M.D.   On: 12/15/2017 00:12    Procedures  MDM   Patient presented to the emergency department with right calf pain and dyspnea.  She has a history of pulmonary embolism and is on chronic anticoagulation, has been on her Eliquis for 1 week.  Labs appear fairly consistent with prior on record Troponin negative, prior provider do not feel this needs repeating, patient without chest pain, low suspicion for ACS. CT angio negative for PE or aortic dissection.  Unfortunately we do not have is unavailable at this facility at this time.  Patient was given a dose of her Eliquis while in the ED and she is able to take this as prescribed tomorrow as the prescription was refilled today.  We will have her return to the hospital tomorrow morning for a scheduled venous duplex. I discussed results, treatment plan, need for return tomorrow AM for [place], and otherwise strict return precautions with the patient. Provided opportunity for questions, patient confirmed understanding and is in agreement with plan.      Amaryllis Dyke, PA-C 12/15/17 0107    Varney Biles, MD 12/15/17 2322

## 2017-12-14 NOTE — ED Triage Notes (Signed)
Pt reports right calf pain and increased swelling in the right leg since Sunday. Hx of PE, currently on Eliquis (has not had full dose in about a week).

## 2017-12-15 ENCOUNTER — Emergency Department (HOSPITAL_COMMUNITY)
Admission: EM | Admit: 2017-12-15 | Discharge: 2017-12-15 | Disposition: A | Discharge status: Home / Self Care | Payer: Self-pay | Attending: Emergency Medicine | Admitting: Emergency Medicine

## 2017-12-15 ENCOUNTER — Ambulatory Visit (HOSPITAL_COMMUNITY)
Admission: RE | Admit: 2017-12-15 | Discharge: 2017-12-15 | Disposition: A | Discharge status: Home / Self Care | Payer: Self-pay | Source: Ambulatory Visit | Attending: Student | Admitting: Student

## 2017-12-15 ENCOUNTER — Encounter (HOSPITAL_COMMUNITY): Payer: Self-pay | Admitting: Emergency Medicine

## 2017-12-15 DIAGNOSIS — E041 Nontoxic single thyroid nodule: Secondary | ICD-10-CM

## 2017-12-15 DIAGNOSIS — Z79899 Other long term (current) drug therapy: Secondary | ICD-10-CM | POA: Insufficient documentation

## 2017-12-15 DIAGNOSIS — R0789 Other chest pain: Secondary | ICD-10-CM | POA: Insufficient documentation

## 2017-12-15 DIAGNOSIS — J45909 Unspecified asthma, uncomplicated: Secondary | ICD-10-CM | POA: Insufficient documentation

## 2017-12-15 DIAGNOSIS — M79609 Pain in unspecified limb: Secondary | ICD-10-CM

## 2017-12-15 DIAGNOSIS — Z9114 Patient's other noncompliance with medication regimen: Secondary | ICD-10-CM | POA: Insufficient documentation

## 2017-12-15 DIAGNOSIS — Z7901 Long term (current) use of anticoagulants: Secondary | ICD-10-CM | POA: Insufficient documentation

## 2017-12-15 DIAGNOSIS — M7989 Other specified soft tissue disorders: Secondary | ICD-10-CM

## 2017-12-15 DIAGNOSIS — I824Z1 Acute embolism and thrombosis of unspecified deep veins of right distal lower extremity: Secondary | ICD-10-CM

## 2017-12-15 DIAGNOSIS — M79604 Pain in right leg: Secondary | ICD-10-CM | POA: Insufficient documentation

## 2017-12-15 DIAGNOSIS — Z91040 Latex allergy status: Secondary | ICD-10-CM | POA: Insufficient documentation

## 2017-12-15 LAB — TROPONIN I: Troponin I: <0.03 ng/mL (ref <0.03)

## 2017-12-15 MED ORDER — APIXABAN 5 MG PO TABS: 5.0000 mg | ORAL_TABLET | Freq: Two times a day (BID) | ORAL | Status: DC

## 2017-12-15 MED ORDER — GI COCKTAIL ~~LOC~~
30.0000 mL | Freq: Once | ORAL | Status: AC
  Administered 2017-12-15: 30 mL via ORAL
  Filled 2017-12-15: qty 30

## 2017-12-15 MED ORDER — ELIQUIS 5 MG VTE STARTER PACK: ORAL_TABLET | ORAL | 0 refills | Status: DC

## 2017-12-15 MED ORDER — APIXABAN 5 MG PO TABS
10.0000 mg | ORAL_TABLET | Freq: Once | ORAL | Status: AC
  Administered 2017-12-15: 10 mg via ORAL
  Filled 2017-12-15: qty 2

## 2017-12-15 MED ORDER — APIXABAN 5 MG PO TABS: 10.0000 mg | ORAL_TABLET | Freq: Two times a day (BID) | ORAL | Status: DC

## 2017-12-15 MED ORDER — APIXABAN (ELIQUIS) EDUCATION KIT FOR DVT/PE PATIENTS
PACK | Freq: Once | Status: AC
  Administered 2017-12-15: 13:00:00
  Filled 2017-12-15: qty 1

## 2017-12-15 NOTE — ED Triage Notes (Signed)
Pt to ER for confirmed right lower extremity DVT. Has vascular study completed this morning. Reports pain x1 week, denies any other symptoms.

## 2017-12-15 NOTE — ED Provider Notes (Signed)
[facility] EMERGENCY DEPARTMENT Provider Note   CSN: [id number] Arrival date & time: 12/15/17  0840     History   Chief Complaint Chief Complaint  Patient presents with  . Leg Pain    HPI [name] is a 52 y.o. female.  HPI  Patient is a 55-year female with a history of pulmonary embolism on Eliquis, depression, asthma, anxiety, and anemia presenting for right lower extremity pain and swelling.  She reports her symptoms began 6 days ago, rated she is running out of Eliquis.  She reports that she began rationing her Eliquis to once a day instead of twice a day.  Patient now currently has a prescription.  Patient reports that the pain was crampy in nature in the right calf.  She thought she noted some redness earlier in the week.  Patient presented the emergency department yesterday, and also had left-sided "burning" chest pain.  CTPA without evidence of pulmonary embolism.  Patient reports that this pain has recurred today and she feels that it feels like "indigestion".  Patient denies any fevers, chills, syncope or presyncope.  Past Medical History:  Diagnosis Date  . Anemia   . Anxiety   . Asthma    hx as child - no prob as adult - no inhaler  . Blood transfusion 01/22/11   transfusion 2 units at [facility]  . Depression 08/2010   psych assessment  . Dyspnea   . Fibroid   . Headache(784.0)    rx for imitrex - last one jan  . Keloid   . Pulmonary embolism [facility])     Patient Active Problem List   Diagnosis Date Noted  . Adenopathy 09/15/2017  . Family history of colon cancer 08/01/2017  . Taking multiple medications for chronic disease 08/01/2017  . Arthritis   . PE (pulmonary thromboembolism) ([place]) 01/26/2017  . Axillary lymphadenopathy 01/26/2017  . Urinary incontinence 01/25/2017  . Hypokalemia 01/25/2017  . Lobar pneumonia ([place]) 01/25/2017  . Pressure ulcer of sacral region, stage 2 ([place]) 01/25/2017  . Abnormal ECG 01/29/2013  . Dyspnea   .  Menorrhagia with regular cycle 02/22/2011  . Anemia 02/22/2011    Past Surgical History:  Procedure Laterality Date  . ABDOMINAL HYSTERECTOMY    . COLONOSCOPY N/A 11/08/2017   Procedure: COLONOSCOPY;  Surgeon: Danie Binder, MD;  Location: [facility]O [facility];  Service: Endoscopy;  Laterality: N/A;  2:00pm  . FLEXIBLE SIGMOIDOSCOPY N/A 09/17/2017   Procedure: FLEXIBLE SIGMOIDOSCOPY;  Surgeon: Danie Binder, MD;  Location: [facility] END[facility];  Service: Endoscopy;  Laterality: N/A;  . HERNIA REPAIR  [id number]   umbilical  . POLYPECTOMY  11/08/2017   Procedure: POLYPECTOMY;  Surgeon: Danie Binder, MD;  Location: [facility];  Service: Endoscopy;;  ascending colon (CSx1), transverse colon (CS x1), splenic flexure (HSx1)  . SVD     Spontaneous vaginal delivery; x 1     OB History    Gravida  3   Para  1   Term  1   Preterm      AB  2   Living  1     SAB  1   TAB  1   Ectopic      Multiple      Live Births               Home Medications    Prior to Admission medications   Medication Sig Start Date End Date Taking? Authorizing Provider  acetaminophen (  TYLENOL) 500 MG tablet Take 1,500 mg by mouth every 6 (six) hours as needed for moderate pain.    [provider]  ELIQUIS 5 MG TABS tablet TAKE 1 TABLET BY MOUTH 2 TIMES DAILY. 10/01/17   Charlott Rakes, MD  ELIQUIS STARTER PACK (ELIQUIS STARTER PACK) 5 MG TABS Take as directed on package: start with two-110m tablets twice daily for 7 days. On day 8, switch to one-574mtablet twice daily. 12/15/17   MuLangston Masker, PA-C  furosemide (LASIX) 20 MG tablet Take 1 tablet (20 mg total) by mouth daily. 03/09/17   NoBrayton CavesPA-C  mirabegron ER (MYRBETRIQ) 25 MG TB24 tablet Take 25 mg by mouth daily.    [provider]  Vitamins A & D (VITAMIN A & D) ointment Apply 1 application topically daily as needed for dry skin.    [provider]    Family History Family History  Problem Relation Age of Onset    . Heart disease Mother   . Hypertension Mother   . Hypertension Father   . Colon cancer Father [id number]     Passed away 6252rs old  . Hypertension Sister   . Cancer Maternal Grandmother        gastric cancer  . Cancer Maternal Grandfather        pancreatic cancer  . Colon cancer Paternal Grandmother   . Colon cancer Paternal Uncle   . Colon polyps Neg Hx     Social History Social History   Tobacco Use  . Smoking status: Never Smoker  . Smokeless tobacco: Never Used  Substance Use Topics  . Alcohol use: No  . Drug use: No     Allergies   Bee venom; Augmentin [amoxicillin-pot clavulanate]; Latex; and Nulytely [peg 332707-EML-JQicarb-nacl]   Review of Systems Review of Systems  Constitutional: Negative for chills and fever.  HENT: Negative for congestion and sore throat.   Eyes: Negative for visual disturbance.  Respiratory: Negative for cough, chest tightness and shortness of breath.   Cardiovascular: Positive for chest pain and leg swelling. Negative for palpitations.  Gastrointestinal: Negative for abdominal pain, nausea and vomiting.  Genitourinary: Negative for dysuria and flank pain.  Musculoskeletal: Negative for back pain and myalgias.  Skin: Negative for rash.  Neurological: Negative for dizziness and syncope.     Physical Exam Updated Vital Signs BP 135/68 (BP Location: Left Arm)   Pulse 78   Temp 98.3 F (36.8 C) (Oral)   Resp 16   LMP 01/31/2011   SpO2 99%   Physical Exam  Constitutional: She appears well-developed and well-nourished. No distress.  HENT:  Head: Normocephalic and atraumatic.  Mouth/Throat: Oropharynx is clear and moist.  Eyes: Pupils are equal, round, and reactive to light. Conjunctivae and EOM are normal.  Neck: Normal range of motion. Neck supple.  Cardiovascular: Normal rate, regular rhythm, S1 normal and S2 normal.  No murmur heard. Pulmonary/Chest: Effort normal and breath sounds normal. She has no wheezes. She has no rales.   Abdominal: Soft. She exhibits no distension. There is no tenderness. There is no guarding.  Musculoskeletal: Normal range of motion. She exhibits edema. She exhibits no deformity.  Bilateral lower extremity edema, nonpitting.  Appears symmetric.  Patient has right-sided calf tenderness. Intact pedal pulses.   Lymphadenopathy:    She has no cervical adenopathy.  Neurological: She is alert.  Cranial nerves grossly intact. Patient moves extremities symmetrically and with good coordination.  Skin: Skin is warm  and dry. No rash noted. No erythema.  Psychiatric: She has a normal mood and affect. Her behavior is normal. Judgment and thought content normal.  Nursing note and vitals reviewed.    ED Treatments / Results  Labs (all labs ordered are listed, but only abnormal results are displayed) Labs Reviewed  TROPONIN I    EKG EKG Interpretation  Date/Time:  Saturday December 15 2017 10:06:08 EDT Ventricular Rate:  79 PR Interval:  170 QRS Duration: 118 QT Interval:  412 QTC Calculation: 472 R Axis:   -52 Text Interpretation:  Normal sinus rhythm Pulmonary disease pattern Left anterior fascicular block Septal infarct , age undetermined Abnormal ECG No significant change since Confirmed by Lennice Sites [phone]) on 12/15/2017 10:10:59 AM   Radiology Ct Angio Chest Pe W/cm &/or Wo Cm  Result Date: 12/15/2017 CLINICAL DATA:  Chest pain EXAM: CT ANGIOGRAPHY CHEST WITH CONTRAST TECHNIQUE: Multidetector CT imaging of the chest was performed using the standard protocol during bolus administration of intravenous contrast. Multiplanar CT image reconstructions and MIPs were obtained to evaluate the vascular anatomy. CONTRAST:  156m ISOVUE-370 IOPAMIDOL (ISOVUE-370) INJECTION 76% COMPARISON:  CT 06/29/2017, 01/26/2017 FINDINGS: Cardiovascular: Satisfactory opacification of the pulmonary arteries to the segmental level. No evidence of pulmonary embolism. Nonaneurysmal aorta. No dissection seen.  Normal heart size. No pericardial effusion Mediastinum/Nodes: Midline trachea. Subcentimeter hypodense lesion left lobe of thyroid. Bilateral axillary lymph nodes measuring up to 2.3 cm on the left and 19 mm on the right. No enlarging mediastinal adenopathy. Esophagus within normal limits. Lungs/Pleura: No pleural effusion. Small subpleural foci density in the lingula and left lung base. No pneumothorax. Upper Abdomen: Stable 1 cm hypodense posterior right hepatic lobe lesion. Musculoskeletal: Scoliosis of the spine. No acute or suspicious abnormality. Review of the MIP images confirms the above findings. IMPRESSION: 1. Negative for acute pulmonary embolus or aortic dissection. 2. Similar appearance of bilateral axillary adenopathy 3. Small foci of subpleural density in the left lung base and lingula may reflect small foci of atelectasis or inflammation. Electronically Signed   By: KDonavan FoilM.D.   On: 12/15/2017 00:12    Procedures Procedures (including critical care time)  Medications Ordered in ED Medications  apixaban (ELIQUIS) tablet 10 mg (has no administration in time range)    Followed by  apixaban (ELIQUIS) tablet 5 mg (has no administration in time range)  gi cocktail (Maalox,Lidocaine,Donnatal) (30 mLs Oral Given 12/15/17 1026)  apixaban (ELIQUIS) Education Kit for DVT/PE patients ( Does not apply Given 12/15/17 1251)  apixaban (ELIQUIS) tablet 10 mg (10 mg Oral Given 12/15/17 1152)     Initial Impression / Assessment and Plan / ED Course  I have reviewed the triage vital signs and the nursing notes.  Pertinent labs & imaging results that were available during my care of the patient were reviewed by me and considered in my medical decision making (see chart for details).  Clinical Course as of Dec 15 1252  Sat Dec 15, 2017  0949 Spoke with ED Pharmacist [facility]who recommends checking with Hematology but does not feel that presentation is representative of treatment  failure. I appreciate her involvement in the care of this patient.   [AM]  1041 Spoke with Dr. GLindi Adiewho recommends that patient continue on with Eliquis at treatment dose for acute DVT.  Appreciate his involvement in the care of this patient.   [AM]    Clinical Course User Index [AM] MAlbesa Seen PA-C    Patient nontoxic-appearing, afebrile,  and hemodynamically stable.  Right lower extremity is positive for DVT.  Patient examined and leg does not appear to be cellulitic.  Spoke with both pharmacy and hematology who recommended restarting Eliquis at treatment dose, as this was likely related to lack of anticoagulation and not treatment failure of Eliquis.  Eliquis starter pack reinitiated.  Regarding chest pain, additional troponin obtained and is negative.  EKG nonischemic and consistent with yesterday's EKG.  Patient had improvement with GI cocktail.  Doubt ACS, and patient had negative CTPA yesterday.  Patient had thyroid nodule noted on CTPA yesterday, and stable axillary lymphadenopathy.  Will defer to outpatient management.  Patient informed of these and given printed report.  Return precautions were given for any worsening pain, swelling, chest pain, shortness breath, syncope or presyncope.  Patient is in understanding and agrees with the plan of care.  Final Clinical Impressions(s) / ED Diagnoses   Final diagnoses:  Acute deep vein thrombosis (DVT) of distal vein of right lower extremity (HCC)  Thyroid nodule    ED Discharge Orders         Ordered    ELIQUIS STARTER PACK [facility] STARTER PACK) 5 MG TABS     12/15/17 1231           Albesa Seen, PA-C 12/15/17 1308    Lennice Sites, DO 12/15/17 1927

## 2017-12-15 NOTE — Progress Notes (Addendum)
ANTICOAGULATION CONSULT NOTE - Initial Consult  Pharmacy Consult for Eliquis Indication: h/o PE with new DVT  Allergies  Allergen Reactions  . Bee Venom Anaphylaxis  . Augmentin [Amoxicillin-Pot Clavulanate] Hives, Itching and Other (See Comments)    Did PCN reaction causing immediate rash, facial/tongue/throat swelling, SOB or lightheadedness with hypotension: no Did PCN reaction causing severe rash involving mucus membranes or skin necrosis: no Has patient had a PCN reaction that required hospitalization: in hospital Has patient had a PCN reaction occurring within the last 10 years: no If all of the above answers are "NO", then may proceed with Cephalosporin use.  Pt reports no recollection of any reactions when taking penicillin in past  . Latex Hives and Other (See Comments)    Local reaction (welts). Patient denies any wheezing or other reaction with latex exposure  . Nulytely [Peg 3350-Kcl-Na Bicarb-Nacl]     NAUSEA AND VOMITING. MAY TOLERATE LOW VOLUME PREP.    Patient Measurements:   Vital Signs: Temp: 98.3 F (36.8 C) (10/19 0844) Temp Source: Oral (10/19 0844) BP: 137/94 (10/19 0844) Pulse Rate: 81 (10/19 0844)  Labs: Recent Labs    12/14/17 2201  HGB 10.0*  HCT 32.6*  PLT 311  CREATININE 0.83  TROPONINI <0.03    Estimated Creatinine Clearance: 119.5 mL/min (by C-G formula based on SCr of 0.83 mg/dL).   Medical History: Past Medical History:  Diagnosis Date  . Anemia   . Anxiety   . Asthma    hx as child - no prob as adult - no inhaler  . Blood transfusion 01/22/11   transfusion 2 units at [facility]  . Depression 08/2010   psych assessment  . Dyspnea   . Fibroid   . Headache(784.0)    rx for imitrex - last one jan  . Keloid   . Pulmonary embolism (HCC)     Assessment: CC/HPI: history of prior PE on chronic anticoagulation who presented to the emergency department with right calf pain for the past 5 days with mention of some shortness of breath. She  had run out of her Eliquis and was unable to take this for 1 week,   Anticoag: h/o PE with new DVT. 52 y/o, 121kg, Scr 0.83 with CrCl>100. Hgb only 10.  Goal of Therapy:  Therapeutic oral anticoagulation   Plan:  Eliquis 56m BID x 7d then 527mBID  Adden: 1211: Spoke with Ms. Rucinski on RN's phone in the ED. Counseled her on new regimen of 1034mID x7d then resort back to her usual dose of 5mg44mD. RN told patient we would be providing her with 7 day dose supply. Told patient we do not provide the medication and she will need prescription for discharge. Informed RN that case manager can get her kit with discount coupon for discharge also.   Zahlia Deshazer S. RobeAlford HighlandarmD, BCPS Clinical Staff Pharmacist [phone] Ghazillinger 12/15/2017,10:53 AM

## 2017-12-15 NOTE — Progress Notes (Signed)
VASCULAR LAB PRELIMINARY  PRELIMINARY  PRELIMINARY  PRELIMINARY  Right lower extremity venous duplex completed.    Preliminary report:  There is acute DVT noted in the right posterior tibial and peroneal veins.  Dyneshia Baccam, RVT 12/15/2017, 8:32 AM

## 2017-12-15 NOTE — Discharge Instructions (Addendum)
You are seen in the emergency department today for calf pain and trouble breathing.  Your labs appeared fairly consistent with prior you have had done.  Your CT scan did not show a blood clot in your lungs (pulmonary embolism).  There was some small lymph nodes which have been seen on previous imaging.  Unfortunately we do not have ultrasound available at this facility this evening.  We would like you to follow the below instructions in order to get an ultrasound of your right calf tomorrow morning.  Take your Eliquis as prescribed.  You were given a dose of this in the emergency department this evening.  IMPORTANT PATIENT INSTRUCTIONS:  You have been scheduled for an Outpatient Vascular Study at [facility].    If tomorrow is a Saturday, Sunday or holiday, please go to the [facility] Emergency Department Registration Desk at 8 am tomorrow morning and tell them you are there for a vascular study.  If tomorrow is a weekday (Monday-Friday), please go to [facility] Admitting Department at 8 am and tell them  you are  there for a vascular study.   Return to the ER sooner for new or worsening symptoms including but not limited to chest pain, worsening pain in the leg or swelling, trouble breathing, or any other concerns that you may have.

## 2017-12-15 NOTE — ED Notes (Signed)
Eliquis packet received from pharmacy and given to pt.  Pharmacy educated pt via nurse phone.  Pt verbalizes understanding of medication instructions.  Daughter at bedside.

## 2017-12-15 NOTE — Discharge Instructions (Addendum)
Information on my medicine - ELIQUIS (apixaban)  WHY WAS ELIQUIS PRESCRIBED FOR YOU? Eliquis was prescribed to treat blood clots that may have been found in the veins of your legs (deep vein thrombosis) or in your lungs (pulmonary embolism) and to reduce the risk of them occurring again.  WHAT DO YOU NEED TO KNOW ABOUT ELIQUIS ? The starting dose is 10 mg (two 5 mg tablets) taken TWICE daily for the FIRST SEVEN (7) DAYS, then on (enter date)  10/26 the dose is reduced to ONE 5 mg tablet taken TWICE daily.  Eliquis may be taken with or without food.   Try to take the dose about the same time in the morning and in the evening. If you have difficulty swallowing the tablet whole please discuss with your pharmacist how to take the medication safely.  Take Eliquis exactly as prescribed and DO NOT stop taking Eliquis without talking to the doctor who prescribed the medication.  Stopping may increase your risk of developing a new blood clot.  Refill your prescription before you run out.  After discharge, you should have regular check-up appointments with your healthcare provider that is prescribing your Eliquis.    WHAT DO YOU DO IF YOU MISS A DOSE? If a dose of ELIQUIS is not taken at the scheduled time, take it as soon as possible on the same day and twice-daily administration should be resumed. The dose should not be doubled to make up for a missed dose.  IMPORTANT SAFETY INFORMATION A possible side effect of Eliquis is bleeding. You should call your healthcare provider right away if you experience any of the following: Bleeding from an injury or your nose that does not stop. Unusual colored urine (red or dark brown) or unusual colored stools (red or black). Unusual bruising for unknown reasons. A serious fall or if you hit your head (even if there is no bleeding).  Some medicines may interact with Eliquis and might increase your risk of bleeding or clotting while on Eliquis. To help  avoid this, consult your healthcare provider or pharmacist prior to using any new prescription or non-prescription medications, including herbals, vitamins, non-steroidal anti-inflammatory drugs (NSAIDs) and supplements.  This website has more information on Eliquis (apixaban): http://www.eliquis.com/eliquis/home  Notes from your ED Provider:   We have restarted you on the Eliquis starter pack.  Please follow the instructions explicitly.  You will take two-5 mg tablets twice a day for 7 days, followed by your usual 5 mg twice a day.  Please hold the prescription you just had filled until next month.  You will complete the Eliquis starter pack this month.  If ever you are close to lapsing in your Eliquis, please contact your healthcare provider in 7 to 10 days in advance.  Return to the emergency department with any worsening leg pain, redness, fever or chills, chest pain or shortness of breath.  Thank you for allowing [place] to participate in your care today.

## 2017-12-18 ENCOUNTER — Telehealth: Payer: Self-pay | Admitting: Nurse Practitioner

## 2017-12-18 NOTE — Telephone Encounter (Signed)
Patient wanted to inform PCP that she was at the hospital 12/15/2017. Patient says she needs more refills because she is suppose to take 2 pills twice a day for 7 days.Please follow up with patient.

## 2017-12-19 NOTE — Telephone Encounter (Signed)
CMA spoke to patient to inform the ED sent her starter pack for Eliquis to the [facility] pharmacy.  Pt. Stated she have not picked it up and will pick the one that was sent recently.

## 2018-01-03 ENCOUNTER — Other Ambulatory Visit: Payer: Self-pay | Admitting: Nurse Practitioner

## 2018-01-03 NOTE — Telephone Encounter (Signed)
1) Medication(s) Requested (by name): -ELIQUIS 5 MG TABS tablet   2) Pharmacy of Choice: -[place], [facility] 3) Special Requests:no   Approved medications will be sent to the pharmacy, we will reach out if there is an issue.  Requests made after 3pm may not be addressed until the following business day!  If a patient is unsure of the name of the medication(s) please note and ask patient to call back when they are able to provide all info, do not send to responsible party until all information is available!

## 2018-01-04 ENCOUNTER — Other Ambulatory Visit: Payer: Self-pay | Admitting: Nurse Practitioner

## 2018-01-04 ENCOUNTER — Other Ambulatory Visit: Payer: Self-pay | Admitting: Family Medicine

## 2018-01-04 MED ORDER — APIXABAN 5 MG PO TABS: 5.0000 mg | ORAL_TABLET | Freq: Two times a day (BID) | ORAL | 4 refills | Status: DC

## 2018-01-04 MED FILL — !ELIQUIS 5MG TABLET: 5 | 30 days supply | Qty: 60 | Fill #0

## 2018-01-09 ENCOUNTER — Ambulatory Visit (INDEPENDENT_AMBULATORY_CARE_PROVIDER_SITE_OTHER): Payer: Self-pay | Admitting: Urology

## 2018-01-09 DIAGNOSIS — N3941 Urge incontinence: Secondary | ICD-10-CM

## 2018-01-09 DIAGNOSIS — R351 Nocturia: Secondary | ICD-10-CM

## 2018-01-10 ENCOUNTER — Telehealth: Payer: Self-pay | Admitting: Licensed Clinical Social Worker

## 2018-01-10 NOTE — Telephone Encounter (Signed)
LCSWA received message from patient requesting a return call. LCSWA placed call to patient.   Pt shared that she was awarded disability and is interested in applying for medicaid. The application process was discussed and pt agreed to return application via mail.  Pt reports that she is experiencing feelings of stress due to ongoing medical conditions and having multiple family members who are sick also. She receives strong support from her mother and adult daughter. The importance of utilizing healthy coping skills to decrease symptoms and increase relaxation were discussed. Pt was appreciative for the call. No additional concerns were noted.

## 2018-01-31 MED FILL — ELIQUIS 5 MG TABLET: 5 | 30 days supply | Qty: 60 | Fill #1

## 2018-03-04 MED FILL — $ELIQUIS 5 MG TABLET: 5 | 90 days supply | Qty: 180 | Fill #2

## 2018-03-05 ENCOUNTER — Ambulatory Visit: Payer: Self-pay | Admitting: Critical Care Medicine

## 2018-03-06 ENCOUNTER — Ambulatory Visit: Payer: Self-pay | Attending: Nurse Practitioner | Admitting: Physician Assistant

## 2018-03-06 ENCOUNTER — Encounter (HOSPITAL_COMMUNITY): Payer: Self-pay | Admitting: Emergency Medicine

## 2018-03-06 ENCOUNTER — Other Ambulatory Visit: Payer: Self-pay

## 2018-03-06 ENCOUNTER — Emergency Department (HOSPITAL_COMMUNITY)
Admission: EM | Admit: 2018-03-06 | Discharge: 2018-03-07 | Disposition: A | Discharge status: Home / Self Care | Payer: Medicaid Other | Attending: Emergency Medicine | Admitting: Emergency Medicine

## 2018-03-06 ENCOUNTER — Emergency Department (HOSPITAL_COMMUNITY): Payer: Medicaid Other

## 2018-03-06 VITALS — BP 166/95 | HR 108 | Temp 98.4°F | Resp 18

## 2018-03-06 DIAGNOSIS — Z7901 Long term (current) use of anticoagulants: Secondary | ICD-10-CM | POA: Diagnosis not present

## 2018-03-06 DIAGNOSIS — Z91040 Latex allergy status: Secondary | ICD-10-CM | POA: Insufficient documentation

## 2018-03-06 DIAGNOSIS — R0789 Other chest pain: Secondary | ICD-10-CM

## 2018-03-06 DIAGNOSIS — R51 Headache: Secondary | ICD-10-CM | POA: Insufficient documentation

## 2018-03-06 DIAGNOSIS — R519 Headache, unspecified: Secondary | ICD-10-CM

## 2018-03-06 DIAGNOSIS — R079 Chest pain, unspecified: Secondary | ICD-10-CM

## 2018-03-06 DIAGNOSIS — Z79899 Other long term (current) drug therapy: Secondary | ICD-10-CM | POA: Insufficient documentation

## 2018-03-06 DIAGNOSIS — Z888 Allergy status to other drugs, medicaments and biological substances status: Secondary | ICD-10-CM | POA: Insufficient documentation

## 2018-03-06 DIAGNOSIS — Z91030 Bee allergy status: Secondary | ICD-10-CM | POA: Insufficient documentation

## 2018-03-06 DIAGNOSIS — I10 Essential (primary) hypertension: Secondary | ICD-10-CM

## 2018-03-06 DIAGNOSIS — R000 Tachycardia, unspecified: Secondary | ICD-10-CM | POA: Insufficient documentation

## 2018-03-06 DIAGNOSIS — R05 Cough: Secondary | ICD-10-CM

## 2018-03-06 DIAGNOSIS — R112 Nausea with vomiting, unspecified: Secondary | ICD-10-CM | POA: Insufficient documentation

## 2018-03-06 DIAGNOSIS — J45909 Unspecified asthma, uncomplicated: Secondary | ICD-10-CM | POA: Insufficient documentation

## 2018-03-06 DIAGNOSIS — Z881 Allergy status to other antibiotic agents status: Secondary | ICD-10-CM | POA: Insufficient documentation

## 2018-03-06 DIAGNOSIS — Z86711 Personal history of pulmonary embolism: Secondary | ICD-10-CM | POA: Insufficient documentation

## 2018-03-06 DIAGNOSIS — R059 Cough, unspecified: Secondary | ICD-10-CM

## 2018-03-06 LAB — CBC
HCT: 36.1 % (ref 36.0–46.0)
Hemoglobin: 10.9 g/dL — ABNORMAL LOW (ref 12.0–15.0)
MCH: 28.2 pg (ref 26.0–34.0)
MCHC: 30.2 g/dL (ref 30.0–36.0)
MCV: 93.5 fL (ref 80.0–100.0)
Platelets: 290 10*3/uL (ref 150–400)
RBC: 3.86 MIL/uL — ABNORMAL LOW (ref 3.87–5.11)
RDW: 14.6 % (ref 11.5–15.5)
WBC: 5.9 10*3/uL (ref 4.0–10.5)
nRBC: 0 % (ref 0.0–0.2)

## 2018-03-06 LAB — BASIC METABOLIC PANEL
Anion gap: 7 (ref 5–15)
BUN: 14 mg/dL (ref 6–20)
CO2: 25 mmol/L (ref 22–32)
Calcium: 9.7 mg/dL (ref 8.9–10.3)
Chloride: 108 mmol/L (ref 98–111)
Creatinine, Ser: 0.73 mg/dL (ref 0.44–1.00)
GFR calc Af Amer: >60 mL/min (ref >60)
GFR calc non Af Amer: >60 mL/min (ref >60)
Glucose, Bld: 92 mg/dL (ref 70–99)
Potassium: 3.6 mmol/L (ref 3.5–5.1)
Sodium: 140 mmol/L (ref 135–145)

## 2018-03-06 LAB — I-STAT TROPONIN, ED: Troponin i, poc: 0 ng/mL (ref 0.00–0.08)

## 2018-03-06 LAB — RAPID URINE DRUG SCREEN, HOSP PERFORMED
Amphetamines: NOT DETECTED
Barbiturates: NOT DETECTED
Benzodiazepines: NOT DETECTED
Cocaine: NOT DETECTED
Opiates: NOT DETECTED
Tetrahydrocannabinol: NOT DETECTED

## 2018-03-06 LAB — I-STAT BETA HCG BLOOD, ED (MC, WL, AP ONLY): I-stat hCG, quantitative: <5 m[IU]/mL (ref <5)

## 2018-03-06 MED ORDER — APIXABAN 5 MG PO TABS
5.0000 mg | ORAL_TABLET | Freq: Two times a day (BID) | ORAL | Status: DC
  Administered 2018-03-06: 5 mg via ORAL
  Filled 2018-03-06: qty 1

## 2018-03-06 MED ORDER — PROCHLORPERAZINE EDISYLATE 10 MG/2ML IJ SOLN
10.0000 mg | Freq: Once | INTRAMUSCULAR | Status: AC
  Administered 2018-03-06: 10 mg via INTRAVENOUS
  Filled 2018-03-06: qty 2

## 2018-03-06 MED ORDER — KETOROLAC TROMETHAMINE 30 MG/ML IJ SOLN
15.0000 mg | Freq: Once | INTRAMUSCULAR | Status: AC
  Administered 2018-03-06: 15 mg via INTRAVENOUS
  Filled 2018-03-06: qty 1

## 2018-03-06 MED ORDER — AMLODIPINE BESYLATE 5 MG PO TABS
5.0000 mg | ORAL_TABLET | Freq: Once | ORAL | Status: AC
  Administered 2018-03-07: 5 mg via ORAL
  Filled 2018-03-06: qty 1

## 2018-03-06 MED ORDER — DIPHENHYDRAMINE HCL 50 MG/ML IJ SOLN
12.5000 mg | Freq: Once | INTRAMUSCULAR | Status: AC
  Administered 2018-03-06: 12.5 mg via INTRAVENOUS
  Filled 2018-03-06: qty 1

## 2018-03-06 MED ORDER — SODIUM CHLORIDE 0.9 % IV BOLUS
1000.0000 mL | Freq: Once | INTRAVENOUS | Status: AC
  Administered 2018-03-06: 1000 mL via INTRAVENOUS

## 2018-03-06 MED ORDER — AMLODIPINE BESYLATE 5 MG PO TABS: 5.0000 mg | ORAL_TABLET | Freq: Every day | ORAL | 1 refills | Status: DC

## 2018-03-06 NOTE — ED Notes (Signed)
Pt is aware that a urine is needed. Will address this again at a later time after receiving fluids.

## 2018-03-06 NOTE — ED Notes (Signed)
Pt is hard stick, this nurse attempted with no success. [place] IV will attempt

## 2018-03-06 NOTE — ED Provider Notes (Signed)
[facility] DEPT Provider Note   CSN: [id number] Arrival date & time: 03/06/18  1553     History   Chief Complaint Chief Complaint  Patient presents with  . Abnormal ECG  . Emesis  . Fatigue    HPI [name] is a 53 y.o. female.  HPI   She presents for evaluation of chest pain, shortness of breath, nausea and vomiting all starting yesterday and persistent today.  She states several family hours at home have URI type symptoms.  She denies fever, cough, focal weakness or paresthesia.  She does not have a prior history of a cardiac disorder.  She has history of PE is currently taking Eliquis.  There are no other known modifying factors.  Past Medical History:  Diagnosis Date  . Anemia   . Anxiety   . Asthma    hx as child - no prob as adult - no inhaler  . Blood transfusion 01/22/11   transfusion 2 units at [facility]  . Depression 08/2010   psych assessment  . Dyspnea   . Fibroid   . Headache(784.0)    rx for imitrex - last one jan  . Keloid   . Pulmonary embolism [facility])     Patient Active Problem List   Diagnosis Date Noted  . Adenopathy 09/15/2017  . Family history of colon cancer 08/01/2017  . Taking multiple medications for chronic disease 08/01/2017  . Arthritis   . PE (pulmonary thromboembolism) ([place]) 01/26/2017  . Axillary lymphadenopathy 01/26/2017  . Urinary incontinence 01/25/2017  . Hypokalemia 01/25/2017  . Lobar pneumonia ([place]) 01/25/2017  . Pressure ulcer of sacral region, stage 2 ([place]) 01/25/2017  . Abnormal ECG 01/29/2013  . Dyspnea   . Menorrhagia with regular cycle 02/22/2011  . Anemia 02/22/2011    Past Surgical History:  Procedure Laterality Date  . ABDOMINAL HYSTERECTOMY    . COLONOSCOPY N/A 11/08/2017   Procedure: COLONOSCOPY;  Surgeon: Danie Binder, MD;  Location[facility];  Service: Endoscopy;  Laterality: N/A;  2:00pm  . FLEXIBLE SIGMOIDOSCOPY N/A 09/17/2017   Procedure: FLEXIBLE SIGMOIDOSCOPY;   Surgeon: Danie Binder, MD;  Location: [facility];  Service: Endoscopy;  Laterality: N/A;  . HERNIA REPAIR  [id number]   umbilical  . POLYPECTOMY  11/08/2017   Procedure: POLYPECTOMY;  Surgeon: Danie Binder, MD;  Location: [facility];  Service: Endoscopy;;  ascending colon (CSx1), transverse colon (CS x1), splenic flexure (HSx1)  . SVD     Spontaneous vaginal delivery; x 1     OB History    Gravida  3   Para  1   Term  1   Preterm      AB  2   Living  1     SAB  1   TAB  1   Ectopic      Multiple      Live Births               Home Medications    Prior to Admission medications   Medication Sig Start Date End Date Taking? Authorizing Provider  acetaminophen (TYLENOL) 500 MG tablet Take 1,500 mg by mouth every 6 (six) hours as needed for moderate pain.   Yes [provider]  apixaban (ELIQUIS) 5 MG TABS tablet Take 1 tablet (5 mg total) by mouth 2 (two) times daily. 01/04/18  Yes Gildardo Pounds, NP  mirabegron ER (MYRBETRIQ) 25 MG TB24 tablet Take 25 mg by mouth  daily.   Yes [provider]  Vitamins A & D (VITAMIN A & D) ointment Apply 1 application topically daily as needed for dry skin.   Yes [provider]  amLODipine (NORVASC) 5 MG tablet Take 1 tablet (5 mg total) by mouth daily. 03/06/18   Daleen Bo, MD  furosemide (LASIX) 20 MG tablet Take 1 tablet (20 mg total) by mouth daily. Patient not taking: Reported on 03/06/2018 03/09/17   Brayton Caves, PA-C    Family History Family History  Problem Relation Age of Onset  . Heart disease Mother   . Hypertension Mother   . Hypertension Father   . Colon cancer Father 27       Passed away 40 yrs old  . Hypertension Sister   . Cancer Maternal Grandmother        gastric cancer  . Cancer Maternal Grandfather        pancreatic cancer  . Colon cancer Paternal Grandmother   . Colon cancer Paternal Uncle   . Colon polyps Neg Hx     Social History Social History   Tobacco  Use  . Smoking status: Never Smoker  . Smokeless tobacco: Never Used  Substance Use Topics  . Alcohol use: No  . Drug use: No     Allergies   Bee venom; Augmentin [amoxicillin-pot clavulanate]; Latex; and Nulytely [peg 9381-WEX-HB bicarb-nacl]   Review of Systems Review of Systems  All other systems reviewed and are negative.    Physical Exam Updated Vital Signs BP (!) 164/56   Pulse (!) 116   Temp 98.7 F (37.1 C) (Oral)   Resp 17   LMP 01/31/2011   SpO2 100%   Physical Exam Vitals signs and nursing note reviewed.  Constitutional:      General: She is not in acute distress.    Appearance: Normal appearance. She is well-developed. She is not ill-appearing or diaphoretic.  HENT:     Head: Normocephalic and atraumatic.     Right Ear: External ear normal.     Left Ear: External ear normal.  Eyes:     Conjunctiva/sclera: Conjunctivae normal.     Pupils: Pupils are equal, round, and reactive to light.  Neck:     Musculoskeletal: Normal range of motion and neck supple. No neck rigidity or muscular tenderness.     Trachea: Phonation normal.  Cardiovascular:     Rate and Rhythm: Normal rate and regular rhythm.     Heart sounds: Normal heart sounds.  Pulmonary:     Effort: Pulmonary effort is normal.     Breath sounds: Normal breath sounds.  Abdominal:     Palpations: Abdomen is soft.     Tenderness: There is no abdominal tenderness.  Musculoskeletal: Normal range of motion.  Skin:    General: Skin is warm and dry.  Neurological:     Mental Status: She is alert and oriented to person, place, and time.     Cranial Nerves: No cranial nerve deficit.     Sensory: No sensory deficit.     Motor: No abnormal muscle tone.     Coordination: Coordination normal.  Psychiatric:        Behavior: Behavior normal.        Thought Content: Thought content normal.        Judgment: Judgment normal.      ED Treatments / Results  Labs (all labs ordered are listed, but only  abnormal results are displayed) Labs Reviewed  CBC -  Abnormal; Notable for the following components:      Result Value   RBC 3.86 (*)    Hemoglobin 10.9 (*)    All other components within normal limits  BASIC METABOLIC PANEL  RAPID URINE DRUG SCREEN, HOSP PERFORMED  URINALYSIS, ROUTINE W REFLEX MICROSCOPIC  I-STAT TROPONIN, ED  I-STAT BETA HCG BLOOD, ED ([facility], [facility], [facility] ONLY)    EKG EKG Interpretation  Date/Time:  Wednesday March 06 2018 16:12:24 EST Ventricular Rate:  103 PR Interval:    QRS Duration: 114 QT Interval:  378 QTC Calculation: 495 R Axis:   -72 Text Interpretation:  Sinus tachycardia LAE, consider biatrial enlargement Left ventricular hypertrophy Anterior Q waves, possibly due to LVH since last tracing no significant change Confirmed by Daleen Bo [phone]) on 03/06/2018 4:32:04 PM   Radiology Dg Chest 2 View  Result Date: 03/06/2018 CLINICAL DATA:  EKG changes EXAM: CHEST - 2 VIEW COMPARISON:  CT 12/14/2017 FINDINGS: Mild cardiomegaly. No focal airspace disease or pleural effusion. No pneumothorax. Scoliosis of the spine. IMPRESSION: No active cardiopulmonary disease.  Mild cardiomegaly. Electronically Signed   By: Donavan Foil M.D.   On: 03/06/2018 17:45    Procedures .Critical Care Performed by: Daleen Bo, MD Authorized by: Daleen Bo, MD   Critical care provider statement:    Critical care time (minutes):  35   Critical care start time:  03/06/2018 6:10 PM   Critical care end time:  03/06/2018 11:52 PM   Critical care time was exclusive of:  Separately billable procedures and treating other patients   Critical care was necessary to treat or prevent imminent or life-threatening deterioration of the following conditions:  Circulatory failure   Critical care was time spent personally by me on the following activities:  Blood draw for specimens, development of treatment plan with patient or surrogate, discussions with consultants, evaluation of patient's  response to treatment, examination of patient, obtaining history from patient or surrogate, ordering and performing treatments and interventions, ordering and review of laboratory studies, pulse oximetry, re-evaluation of patient's condition, review of old charts and ordering and review of radiographic studies   (including critical care time)  Medications Ordered in ED Medications  apixaban (ELIQUIS) tablet 5 mg (5 mg Oral Given 03/06/18 2310)  amLODipine (NORVASC) tablet 5 mg (has no administration in time range)  sodium chloride 0.9 % bolus 1,000 mL (0 mLs Intravenous Stopped 03/06/18 2142)  prochlorperazine (COMPAZINE) injection 10 mg (10 mg Intravenous Given 03/06/18 1946)  diphenhydrAMINE (BENADRYL) injection 12.5 mg (12.5 mg Intravenous Given 03/06/18 1947)  ketorolac (TORADOL) 30 MG/ML injection 15 mg (15 mg Intravenous Given 03/06/18 2311)     Initial Impression / Assessment and Plan / ED Course  I have reviewed the triage vital signs and the nursing notes.  Pertinent labs & imaging results that were available during my care of the patient were reviewed by me and considered in my medical decision making (see chart for details).  Clinical Course as of Mar 06 2352  Wed Mar 06, 2018  1833 Normal  I-stat troponin, ED [EW]  [phone] Normal  Basic metabolic panel [EW]  1937 Normal  I-Stat beta hCG blood, ED [EW]  1833 Normal except hemoglobin low  CBC(!) [EW]  1833 No infiltrate, or CHF, images reviewed by me  DG Chest 2 View [EW]  1940 Patient continues to complain of headache, she has not had any additional vomiting.  She continues to be hypertensive and tachycardic.  She states she thinks her pressure  is up because she is uncomfortable.  Additional treatment ordered at this time.   [EW]  2239 She states she still has a headache, and now she states that the headache has been present for 1 month.  Heart rate and blood pressure are both improved.  We will give some additional medication and  reassess.   [EW]    Clinical Course User Index [EW] Daleen Bo, MD     Patient Vitals for the past 24 hrs:  BP Temp Temp src Pulse Resp SpO2  03/06/18 2330 (!) 164/56 - - (!) 116 17 100 %  03/06/18 2300 (!) 157/55 - - (!) 120 20 98 %  03/06/18 2231 (!) 133/122 - - (!) 119 20 100 %  03/06/18 2139 (!) 158/56 - - (!) 118 16 100 %  03/06/18 2130 (!) 162/52 - - (!) 118 14 100 %  03/06/18 2100 (!) 164/67 - - - 18 -  03/06/18 2031 (!) 153/75 - - (!) 123 (!) 21 100 %  03/06/18 1930 (!) 169/94 - - (!) 115 (!) 21 100 %  03/06/18 1913 (!) 195/73 - - (!) 112 (!) 21 100 %  03/06/18 1800 (!) 159/64 - - (!) 108 16 100 %  03/06/18 1756 (!) 170/100 - - (!) 105 18 100 %  03/06/18 1712 (!) 168/100 - - (!) 106 20 100 %  03/06/18 1615 (!) 165/109 98.7 F (37.1 C) Oral (!) 106 18 96 %    11:39 PM Reevaluation with update and discussion. After initial assessment and treatment, an updated evaluation reveals she appears fairly comfortable at this time she is alert and conversant.  Last blood pressure 164/56.  Heart rate 116.Daleen Bo   Medical Decision Making: Nonspecific headache and chest pain.  Troponin negative.  EKG nonacute.  Blood pressure and heart rate improved with treatment.  Doubt hypertensive urgency, metabolic instability or impending vascular collapse.  Patient evaluate with medications, observation and multiple dosages of pain relievers.  Possible migraine headache.  There are no other known modifying factors.  CRITICAL CARE-yes Performed by: Daleen Bo  Nursing Notes Reviewed/ Care Coordinated Applicable Imaging Reviewed Interpretation of Laboratory Data incorporated into ED treatment  The patient appears reasonably screened and/or stabilized for discharge and I doubt any other medical condition or other [facility] requiring further screening, evaluation, or treatment in the ED at this time prior to discharge.  Plan: Home Medications-continue usual medication; Home  Treatments-low-salt diet; return here if the recommended treatment, does not improve the symptoms; Recommended follow up-ECP checkup 1 week and as needed    Final Clinical Impressions(s) / ED Diagnoses   Final diagnoses:  Hypertension, unspecified type  Nonintractable headache, unspecified chronicity pattern, unspecified headache type    ED Discharge Orders         Ordered    amLODipine (NORVASC) 5 MG tablet  Daily     03/06/18 2350           Daleen Bo, MD 03/06/18 2354

## 2018-03-06 NOTE — ED Notes (Signed)
Bed: [facility] Expected date:  Expected time:  Means of arrival:  Comments: 

## 2018-03-06 NOTE — ED Triage Notes (Signed)
Pt reports that [facility] at [facility] and was told to go to Ed due to EKG changes since her last one. Pt also having n/v and fatigue

## 2018-03-06 NOTE — ED Notes (Signed)
Pt's linen and gown were replaced due to being soiled. Pt states she is incontinent, pure-wick was placed.

## 2018-03-06 NOTE — Patient Instructions (Signed)
Go directly to the Emergency Department.

## 2018-03-06 NOTE — Progress Notes (Signed)
Patient ID: LEEANDRA ELLERSON, female   DOB: 01-19-1966, 53 y.o.   MRN: [id number]   Maverick Patman, is a 53 y.o. female  YPP:[id number]  WPY:[id number]  DOB - Apr 12, 1965  Subjective:  Chief Complaint and HPI: Alesi Zachery is a 53 y.o. female here today with multiple issues.  Awakened feeling bad this morning with chest pain, cough, nausea, and vomiting.  Everyone in household is sick with various respiratory or GI illnesses.  No fever.  No radiating pain into neck or arms.  Daughter drove her.  No diaphoresis.  Several episodes of vomiting this morning and can't keep anything down.  Pain in chest is in the middle.  Mild to moderate and constant.  No alleviating factors.    Also c/o HA X 1  Month.  Not taking OTCs.  No vision changes.  HA is on R side of head.  +throbbing when it occurs.     ROS:   Constitutional:  No f/c, No night sweats, No unexplained weight loss. EENT:  No vision changes, No blurry vision, No hearing changes. No mouth, throat, or ear problems.  Respiratory: +cough, + SOB Cardiac: + CP, no palpitations GI:  No abd pain, No N/V/D. GU: No Urinary s/sx Musculoskeletal: No joint pain Neuro: No headache, no dizziness, no motor weakness.  Skin: No rash Endocrine:  No polydipsia. No polyuria.  Psych: Denies SI/HI  No problems updated.  ALLERGIES: Allergies  Allergen Reactions  . Bee Venom Anaphylaxis  . Augmentin [Amoxicillin-Pot Clavulanate] Hives, Itching and Other (See Comments)    Did PCN reaction causing immediate rash, facial/tongue/throat swelling, SOB or lightheadedness with hypotension: no Did PCN reaction causing severe rash involving mucus membranes or skin necrosis: no Has patient had a PCN reaction that required hospitalization: in hospital Has patient had a PCN reaction occurring within the last 10 years: no If all of the above answers are "NO", then may proceed with Cephalosporin use.  Pt reports no recollection of any reactions when taking  penicillin in past  . Latex Hives and Other (See Comments)    Local reaction (welts). Patient denies any wheezing or other reaction with latex exposure  . Nulytely [Peg 3350-Kcl-Na Bicarb-Nacl]     NAUSEA AND VOMITING. MAY TOLERATE LOW VOLUME PREP.    PAST MEDICAL HISTORY: Past Medical History:  Diagnosis Date  . Anemia   . Anxiety   . Asthma    hx as child - no prob as adult - no inhaler  . Blood transfusion 01/22/11   transfusion 2 units at [facility]  . Depression 08/2010   psych assessment  . Dyspnea   . Fibroid   . Headache(784.0)    rx for imitrex - last one jan  . Keloid   . Pulmonary embolism ([place])     MEDICATIONS AT HOME: Prior to Admission medications   Medication Sig Start Date End Date Taking? Authorizing Provider  acetaminophen (TYLENOL) 500 MG tablet Take 1,500 mg by mouth every 6 (six) hours as needed for moderate pain.    [provider]  apixaban (ELIQUIS) 5 MG TABS tablet Take 1 tablet (5 mg total) by mouth 2 (two) times daily. 01/04/18   Gildardo Pounds, NP  furosemide (LASIX) 20 MG tablet Take 1 tablet (20 mg total) by mouth daily. 03/09/17   Brayton Caves, PA-C  mirabegron ER (MYRBETRIQ) 25 MG TB24 tablet Take 25 mg by mouth daily.    [provider]  Vitamins A & D (VITAMIN A & D)  ointment Apply 1 application topically daily as needed for dry skin.    [provider]     Objective:  EXAM:   Vitals:   03/06/18 1528  BP: (!) 166/95  Pulse: (!) 108  Resp: 18  Temp: 98.4 F (36.9 C)  SpO2: 100%    General appearance : A&OX3. NAD. Non-toxic-appearing, does appear ill.  In a wheelchair HEENT: Atraumatic and Normocephalic.  PERRLA. EOM intact.  TM clear B. Mouth-MMM, post pharynx WNL w/o erythema, No PND. Neck: supple, no JVD. No cervical lymphadenopathy. No thyromegaly Chest/Lungs:  Lungs with good air movement throughout.  There is some occasional wheezing. CVS: S1 S2 regular, no murmurs, gallops, rubs.  +sinus  tachycardia Neurology:  CN II-XII grossly intact, Non focal.   Psych:  TP linear. J/I WNL. Normal speech. Appropriate eye contact and affect.  Skin:  No Rash  Data Review Lab Results  Component Value Date   HGBA1C 5.6 01/31/2017     Assessment & Plan   1. Chest pain, unspecified type EKG with sinus tachycardia.  No acute ST segment changes.  EKG compared with 11/2017 and there are changes though no ST elevation.  Declines EMS transport.  Daughter agrees to take her directly to the ED.    2. Sinus tachycardia Likely due to vomiting/early dehydration  3. Non-intractable vomiting with nausea, unspecified vomiting type To ED  4. Cough To ED  5. Nonintractable headache, unspecified chronicity pattern, unspecified headache type BP uncontrolled.  Patient have been counseled extensively about nutrition and exercise  Return in about 3 weeks (around 03/27/2018) for [name]-uncontrolled htn.  The patient was given clear instructions to go to ER or return to medical center if symptoms don't improve, worsen or new problems develop. The patient verbalized understanding. The patient was told to call to get lab results if they haven't heard anything in the next week.     Freeman Caldron, PA-C [facility] [place], [place]   03/06/2018, 3:32 PM

## 2018-03-06 NOTE — Discharge Instructions (Addendum)
Start taking the blood pressure medication tomorrow.  Avoid eating foods containing salt or adding salt to your food.  Return here if needed for problems.

## 2018-03-06 NOTE — ED Notes (Signed)
Rounded on patient and pt stated she was in pain. Dr. Ree Kida of this and  that pt was Hypertensive and tachycardic.

## 2018-03-07 LAB — URINALYSIS, ROUTINE W REFLEX MICROSCOPIC
Bacteria, UA: NONE SEEN
Bilirubin Urine: NEGATIVE
Glucose, UA: NEGATIVE mg/dL
Ketones, ur: NEGATIVE mg/dL
Leukocytes, UA: NEGATIVE
Nitrite: NEGATIVE
Protein, ur: NEGATIVE mg/dL
Specific Gravity, Urine: 1.01 (ref 1.005–1.030)
pH: 8 (ref 5.0–8.0)

## 2018-03-07 MED FILL — AMLODIPINE BESYLATE 5 MG TA: 5 | 30 days supply | Qty: 30 | Fill #0

## 2018-03-15 ENCOUNTER — Other Ambulatory Visit: Payer: Self-pay

## 2018-03-15 DIAGNOSIS — I2699 Other pulmonary embolism without acute cor pulmonale: Secondary | ICD-10-CM

## 2018-03-18 ENCOUNTER — Encounter: Payer: Self-pay | Admitting: Hematology

## 2018-03-18 ENCOUNTER — Inpatient Hospital Stay: Payer: Medicaid Other

## 2018-03-18 ENCOUNTER — Telehealth: Payer: Self-pay | Admitting: Hematology

## 2018-03-18 ENCOUNTER — Inpatient Hospital Stay: Payer: Medicaid Other | Attending: Hematology | Admitting: Hematology

## 2018-03-18 VITALS — BP 146/52 | HR 98 | Temp 97.7°F | Resp 18 | Ht 72.0 in

## 2018-03-18 DIAGNOSIS — R591 Generalized enlarged lymph nodes: Secondary | ICD-10-CM | POA: Insufficient documentation

## 2018-03-18 DIAGNOSIS — Z7901 Long term (current) use of anticoagulants: Secondary | ICD-10-CM | POA: Insufficient documentation

## 2018-03-18 DIAGNOSIS — R599 Enlarged lymph nodes, unspecified: Secondary | ICD-10-CM

## 2018-03-18 DIAGNOSIS — I2699 Other pulmonary embolism without acute cor pulmonale: Secondary | ICD-10-CM

## 2018-03-18 DIAGNOSIS — Z86711 Personal history of pulmonary embolism: Secondary | ICD-10-CM | POA: Insufficient documentation

## 2018-03-18 DIAGNOSIS — M7989 Other specified soft tissue disorders: Secondary | ICD-10-CM | POA: Diagnosis not present

## 2018-03-18 DIAGNOSIS — R32 Unspecified urinary incontinence: Secondary | ICD-10-CM | POA: Diagnosis not present

## 2018-03-18 DIAGNOSIS — Z79899 Other long term (current) drug therapy: Secondary | ICD-10-CM | POA: Diagnosis not present

## 2018-03-18 LAB — CMP (CANCER CENTER ONLY)
ALT: 22 U/L (ref 0–44)
AST: 18 U/L (ref 15–41)
Albumin: 3.8 g/dL (ref 3.5–5.0)
Alkaline Phosphatase: 64 U/L (ref 38–126)
Anion gap: 8 (ref 5–15)
BUN: 11 mg/dL (ref 6–20)
CO2: 26 mmol/L (ref 22–32)
Calcium: 9.8 mg/dL (ref 8.9–10.3)
Chloride: 107 mmol/L (ref 98–111)
Creatinine: 0.8 mg/dL (ref 0.44–1.00)
GFR, Est AFR Am: >60 mL/min (ref >60)
GFR, Estimated: >60 mL/min (ref >60)
Glucose, Bld: 124 mg/dL — ABNORMAL HIGH (ref 70–99)
Potassium: 3.7 mmol/L (ref 3.5–5.1)
Sodium: 141 mmol/L (ref 135–145)
Total Bilirubin: 0.7 mg/dL (ref 0.3–1.2)
Total Protein: 8.4 g/dL — ABNORMAL HIGH (ref 6.5–8.1)

## 2018-03-18 LAB — CBC WITH DIFFERENTIAL (CANCER CENTER ONLY)
Abs Immature Granulocytes: 0.02 10*3/uL (ref 0.00–0.07)
Basophils Absolute: 0 10*3/uL (ref 0.0–0.1)
Basophils Relative: 0 %
Eosinophils Absolute: 0.2 10*3/uL (ref 0.0–0.5)
Eosinophils Relative: 4 %
HCT: 35.1 % — ABNORMAL LOW (ref 36.0–46.0)
Hemoglobin: 11 g/dL — ABNORMAL LOW (ref 12.0–15.0)
Immature Granulocytes: 0 %
Lymphocytes Relative: 23 %
Lymphs Abs: 1.4 10*3/uL (ref 0.7–4.0)
MCH: 28.3 pg (ref 26.0–34.0)
MCHC: 31.3 g/dL (ref 30.0–36.0)
MCV: 90.2 fL (ref 80.0–100.0)
Monocytes Absolute: 0.5 10*3/uL (ref 0.1–1.0)
Monocytes Relative: 8 %
Neutro Abs: 4.1 10*3/uL (ref 1.7–7.7)
Neutrophils Relative %: 65 %
Platelet Count: 325 10*3/uL (ref 150–400)
RBC: 3.89 MIL/uL (ref 3.87–5.11)
RDW: 14.6 % (ref 11.5–15.5)
WBC Count: 6.3 10*3/uL (ref 4.0–10.5)
nRBC: 0 % (ref 0.0–0.2)

## 2018-03-18 NOTE — Progress Notes (Signed)
[place]   [phone] [phone]   Clinic Follow up Note   Patient Care Team: Gildardo Pounds, NP as PCP - General (Nurse Practitioner) Danie Binder, MD as Consulting Physician (Gastroenterology) Juan Quam, MD as Referring Physician (Orthopedic Surgery)  Date of Service:  03/18/2018  CHIEF COMPLAINT: F/u for PE and Lymphadenopathy  Hematological History: --Event #1, PE -Nov 2018:Patient presented to the emergency room with pleuritic chest pain, fever, shortness of breath and urinary incontinence with shortness of breath and chest pain progressing over several days. This was associated with bilateral calf pain and development of sacral decubitus ulcers due to decreased mobility. --d-dimer 8.43 on 01/25/17 --ECHO: 01/26/17: LVEF 65-70%, normal right ventricular cavity size, normal wall thickness, and normal systolic function. Normal IVC caliber. --Doppler US BL LE, 01/26/17:No evidence of deep vein thrombosis. --CTA chest, 01/26/17:No evidence of hilaror mediastinal lymphadenopathy. Anterior right pulmonary lobe consolidation. Positive for pulmonary emboli in the Ltlower lobe. Subsegmental and lingulararteries pathological right axillary  #2 lymphadenopathy. --Mammogram BL, 05/24/17:No evidence of malignancy, but prominent axillary lymph nodes identified. --Rt Axillary LN Bx, 06/01/17:Reactive lymphoid hyperplasia. Flow cytometry negative for evidence of monoclonal lymphoproliferative process.   CURRENT THERAPY:  On Eliquis twice daily Indefinitely   INTERVAL HISTORY:  [name] is here for a follow up of PE and lymphadenopathy. She was last seen by me 6 months ago. She presents to the clinic today with her daughter.  She notes she has to wait on her knee surgery until she has medicaid.  She notes having a DVT in 11/2017. She was taken to the ED. She ran out of her  Eliquis and did not say anything to get refill. She now gets 3 months supply and gets it for free. She refills through her PCP. She notes she is on Norvasc now.  She notes she still does not move around enough. She has pain in her hands as well and has not been seen by RA yet. She notes her Medicaid should be in effects next month. She notes if her mother gets sicker, Pt may have to go to nursing home until her condition improves.     REVIEW OF SYSTEMS:   Constitutional: Denies fevers, chills or abnormal weight loss Eyes: Denies blurriness of vision Ears, nose, mouth, throat, and face: Denies mucositis or sore throat Respiratory: Denies cough, dyspnea or wheezes Cardiovascular: Denies palpitation, chest discomfort (+) Severe lower extremity swelling Gastrointestinal:  Denies nausea, heartburn or change in bowel habits Skin: Denies abnormal skin rashes MSK: (+) Knee pain (+) b/l hand and arm pain  Lymphatics: Denies new lymphadenopathy or easy bruising Neurological:Denies numbness, tingling or new weaknesses Behavioral/Psych: Mood is stable, no new changes  All other systems were reviewed with the patient and are negative.  MEDICAL HISTORY:  Past Medical History:  Diagnosis Date  . Anemia   . Anxiety   . Asthma    hx as child - no prob as adult - no inhaler  . Blood transfusion 01/22/11   transfusion 2 units at [facility]  . Depression 08/2010   psych assessment  . Dyspnea   . Fibroid   . Headache(784.0)    rx for imitrex - last one jan  . Keloid   . Pulmonary embolism ([place])     SURGICAL HISTORY: Past Surgical History:  Procedure Laterality Date  . ABDOMINAL HYSTERECTOMY    . COLONOSCOPY N/A 11/08/2017   Procedure: COLONOSCOPY;  Surgeon: Danie Binder, MD;  Location: [facility];  Service: Endoscopy;  Laterality: N/A;  2:00pm  . FLEXIBLE SIGMOIDOSCOPY N/A 09/17/2017   Procedure: FLEXIBLE SIGMOIDOSCOPY;  Surgeon: Danie Binder, MD;  Location: [facility];  Service: Endoscopy;   Laterality: N/A;  . HERNIA REPAIR  3716   umbilical  . POLYPECTOMY  11/08/2017   Procedure: POLYPECTOMY;  Surgeon: Danie Binder, MD;  Location: [facility];  Service: Endoscopy;;  ascending colon (CSx1), transverse colon (CS x1), splenic flexure (HSx1)  . SVD     Spontaneous vaginal delivery; x 1    I have reviewed the social history and family history with the patient and they are unchanged from previous note.  ALLERGIES:  is allergic to bee venom; augmentin [amoxicillin-pot clavulanate]; latex; and nulytely [peg 3350-kcl-na bicarb-nacl].  MEDICATIONS:  Current Outpatient Medications  Medication Sig Dispense Refill  . acetaminophen (TYLENOL) 500 MG tablet Take 1,500 mg by mouth every 6 (six) hours as needed for moderate pain.    Marland Kitchen amLODipine (NORVASC) 5 MG tablet Take 1 tablet (5 mg total) by mouth daily. 30 tablet 1  . apixaban (ELIQUIS) 5 MG TABS tablet Take 1 tablet (5 mg total) by mouth 2 (two) times daily. 60 tablet 4  . furosemide (LASIX) 20 MG tablet Take 1 tablet (20 mg total) by mouth daily. 30 tablet 1  . mirabegron ER (MYRBETRIQ) 25 MG TB24 tablet Take 25 mg by mouth daily.    . Vitamins A & D (VITAMIN A & D) ointment Apply 1 application topically daily as needed for dry skin.     No current facility-administered medications for this visit.     PHYSICAL EXAMINATION: ECOG PERFORMANCE STATUS: 3 - Symptomatic, >50% confined to bed  Vitals:   03/18/18 1427  BP: (!) 146/52  Pulse: 98  Resp: 18  Temp: 97.7 F (36.5 C)  SpO2: 100%   Filed Weights    GENERAL:alert, no distress and comfortable SKIN: skin color, texture, turgor are normal, no rashes or significant lesions EYES: normal, Conjunctiva are pink and non-injected, sclera clear OROPHARYNX:no exudate, no erythema and lips, buccal mucosa, and tongue normal  NECK: supple, thyroid normal size, non-tender, without nodularity LYMPH:  no palpable lymphadenopathy in the cervical and inguinal (+) palpable axillary  LN and groin area L>R measuring 1-2cm LUNGS: clear to auscultation and percussion with normal breathing effort HEART: regular rate & rhythm and no murmurs (+) Severe b/l lower extremity edema (+) mild b/l distal brachial swelling  ABDOMEN:abdomen soft, non-tender and normal bowel sounds Musculoskeletal:no cyanosis of digits and no clubbing  NEURO: alert & oriented x 3 with fluent speech, no focal motor/sensory deficits  LABORATORY DATA:  I have reviewed the data as listed CBC Latest Ref Rng & Units 03/18/2018 03/06/2018 12/14/2017  WBC 4.0 - 10.5 K/uL 6.3 5.9 5.4  Hemoglobin 12.0 - 15.0 g/dL 11.0(L) 10.9(L) 10.0(L)  Hematocrit 36.0 - 46.0 % 35.1(L) 36.1 32.6(L)  Platelets 150 - 400 K/uL 325 290 311     CMP Latest Ref Rng & Units 03/18/2018 03/06/2018 12/14/2017  Glucose 70 - 99 mg/dL 124(H) 92 100(H)  BUN 6 - 20 mg/dL 11 14 15   Creatinine 0.44 - 1.00 mg/dL 0.80 0.73 0.83  Sodium 135 - 145 mmol/L 141 140 142  Potassium 3.5 - 5.1 mmol/L 3.7 3.6 3.7  Chloride 98 - 111 mmol/L 107 108 112(H)  CO2 22 - 32 mmol/L 26 25 26   Calcium 8.9 - 10.3 mg/dL 9.8 9.7 9.1  Total Protein 6.5 - 8.1  g/dL 8.4(H) - -  Total Bilirubin 0.3 - 1.2 mg/dL 0.7 - -  Alkaline Phos 38 - 126 U/L 64 - -  AST 15 - 41 U/L 18 - -  ALT 0 - 44 U/L 22 - -      RADIOGRAPHIC STUDIES: I have personally reviewed the radiological images as listed and agreed with the findings in the report. No results found.   ASSESSMENT & PLAN:  JUDAH CHEVERE is a 53 y.o. female with   1. PE ( Pulmonary embolism) in 12/2016,?  Lupus anticoagulant (+)  -Initial event occurred 12/2016 likely provoked by her limited mobility secondary to significant left knee injury  -She had a one spontaneous abortion, lupus anticoagulant was checked in April 2019, which was positive.  Will need to repeat in the future to confirm -Pt notes after she ran out of Eliquis she did not request a refill and was off medication. She had a right LE DVT episode in  11/2017 when she was off Eliquis.  -Due to recurrent blood clot, morbid obesity and low mobility I discussed she will have to remain on anticoagulation indefinitely now. She now get 3 months refills of Eliquis by her PCP. -F/u in 8 weeks after scan   2. B/l axillary and inguinal Lymphadenopathy with Significant B/l leg swelling -06/01/17 left lymph node biopsy found this to be Reactive lymphoid hyperplasia. Flow cytometry negative for evidence of monoclonal lymphoproliferative process. -06/29/17 CT showed b/l lymph node and inguinal adenopathy, lymphoproliferative disorder not excluded. No other thoracic or abdominal lymph nodes seen as enlarged.  -I will refer her to lymphedema clinic for her legs. She is agreeable.  -She is currently on Lasix, will continue  -I strongly encouraged her to change sitting position to avoid bed sores -Will get CT CAP to further evaluate Lymphadenopathy  -she has poor hygiene.  I strongly encouraged her family members to keep her washed and skin folds dry as to prevent fungal infection.    3. Urinary Incontinence  -She is currently on Ditropan and she wears diapers  -I also suggest she see a PT for pelvic floor exercises.   4. Decreased Mobility due to Left Knee Injury  -She has left knee injury which requires surgery and is mostly wheelchair bound due to this.  -She will continue to follow up with orthopedic surgeon Dr. Landry Dyke  -Her Knee surgery has been put off until she get insurance.   5. Mild swelling of joints in hands and elbows, with pain -I recommend she follow up with PCP -She plans to see RA soon for evaluation for possible Rheumatoid arthritis.    6. Genetic Testing, Cancer Screenings -Given her extensive paternal and maternal history of GI cancer she is eligible for genetic testing. She is interested but only has [employer], no insurance. She will wait until she gets Medicaid -04/2017 Mammogram was benign  -10/2017 colonoscopy showed 4  benign tubular adenomas    PLAN:  -Lymph edema clinic referral  -Lab and CT CAP scan in 6-7 weeks -F/u in 8 weeks  -Continue Eliquis indefinitely    No problem-specific Assessment & Plan notes found for this encounter.   Orders Placed This Encounter  Procedures  . CT Abdomen Pelvis W Contrast    Standing Status:   Future    Standing Expiration Date:   03/18/2019    Order Specific Question:   If indicated for the ordered procedure, I authorize the administration of contrast media per Radiology protocol  Answer:   Yes    Order Specific Question:   Is patient pregnant?    Answer:   No    Order Specific Question:   Preferred imaging location?    Answer:   [facility]    Order Specific Question:   Is Oral Contrast requested for this exam?    Answer:   Yes, Per Radiology protocol    Order Specific Question:   Radiology Contrast Protocol - do NOT remove file path    Answer:   \\[email or web address]  . CT Chest W Contrast    Standing Status:   Future    Standing Expiration Date:   03/18/2019    Order Specific Question:   If indicated for the ordered procedure, I authorize the administration of contrast media per Radiology protocol    Answer:   Yes    Order Specific Question:   Is patient pregnant?    Answer:   No    Order Specific Question:   Preferred imaging location?    Answer:   [facility]    Order Specific Question:   Radiology Contrast Protocol - do NOT remove file path    Answer:   \\[email or web address]  . Ambulatory referral to Physical Therapy    Referral Priority:   Routine    Referral Type:   Physical Medicine    Referral Reason:   Specialty Services Required    Requested Specialty:   Physical Therapy    Number of Visits Requested:   1   All questions were answered. The patient knows to call the clinic with any problems, questions or concerns. No barriers to learning was detected. I spent 20 minutes  counseling the patient face to face. The total time spent in the appointment was 25 minutes and more than 50% was on counseling and review of test results     Truitt Merle, MD 03/18/2018   I, Joslyn Devon, am acting as scribe for Truitt Merle, MD.   I have reviewed the above documentation for accuracy and completeness, and I agree with the above.

## 2018-03-18 NOTE — Telephone Encounter (Signed)
Gave patient avs report and appointments for March. Central radiology will call re scan.  °

## 2018-04-09 MED FILL — AMLODIPINE BESYLATE 5 MG TA: 5 | 30 days supply | Qty: 30 | Fill #1

## 2018-04-17 ENCOUNTER — Ambulatory Visit: Payer: Self-pay | Admitting: Urology

## 2018-05-06 ENCOUNTER — Ambulatory Visit (HOSPITAL_COMMUNITY)
Admission: RE | Admit: 2018-05-06 | Discharge: 2018-05-06 | Disposition: A | Discharge status: Home / Self Care | Payer: Medicaid Other | Source: Ambulatory Visit | Attending: Hematology | Admitting: Hematology

## 2018-05-06 DIAGNOSIS — R599 Enlarged lymph nodes, unspecified: Secondary | ICD-10-CM

## 2018-05-06 DIAGNOSIS — R591 Generalized enlarged lymph nodes: Secondary | ICD-10-CM | POA: Diagnosis present

## 2018-05-06 MED ORDER — SODIUM CHLORIDE (PF) 0.9 % IJ SOLN
INTRAMUSCULAR | Status: AC
  Filled 2018-05-06: qty 50

## 2018-05-06 MED ORDER — IOHEXOL 300 MG/ML  SOLN
100.0000 mL | Freq: Once | INTRAMUSCULAR | Status: AC | PRN
  Administered 2018-05-06: 100 mL via INTRAVENOUS

## 2018-05-07 ENCOUNTER — Inpatient Hospital Stay: Payer: Self-pay | Admitting: Critical Care Medicine

## 2018-05-07 NOTE — Progress Notes (Deleted)
   Subjective:    Patient ID: [name], female    DOB: [birth year], 53 y.o.   MRN: [id number]  52 y.o.F at last OV was sent to the ED in Jan 2020  Hx of recurrent VTE and on eliquis for life Per [facility]EME recent OV: Event #1,PE -Nov 2018:Patient presented to the emergency room with pleuritic chest pain, fever, shortness of breath and urinary incontinence with shortness of breath and chest pain progressing over several days. This was associated with bilateral calf pain and development of sacral decubitus ulcers due to decreased mobility. --d-dimer 8.43on 01/25/17 --ECHO:01/26/17: LVEF 65-70%, normal right ventricular cavity size, normal wall thickness, and normal systolic function. Normal IVC caliber. --Doppler US BL LE, 01/26/17:No evidence of deep vein thrombosis. --CTA chest, 01/26/17:No evidence of hilaror mediastinal lymphadenopathy. Anterior right pulmonary lobe consolidation. Positive for pulmonary emboli in the Ltlower lobe. Subsegmental and lingulararteries pathological right axillary #2lymphadenopathy. --Mammogram BL, 05/24/17:No evidence of malignancy, but prominent axillary lymph nodes identified. --Rt Axillary LN Bx, 06/01/17:Reactive lymphoid hyperplasia. Flow cytometry negative for evidence of monoclonal lymphoproliferative process.   CURRENT THERAPY:  On Eliquis twice daily Indefinitely   INTERVAL HISTORY:  ANITRIA [name] is here for a follow up of PE and lymphadenopathy. She was last seen by me 6 months ago. She presents to the clinic today with her daughter.  She notes she has to wait on her knee surgery until she has medicaid.  She notes having a DVT in 11/2017. She was taken to the ED. She ran out of her Eliquis and did not say anything to get refill. She now gets 3 months supply and gets it for free. She refills through her PCP. She notes she is on Norvasc now.  She notes she still does not move  around enough. She has pain in her hands as well and has not been seen by RA yet. She notes her Medicaid should be in effects next month. She notes if her mother gets sicker, Pt may have to go to nursing home until her condition improves.       Review of Systems     Objective:   Physical Exam        Assessment & Plan:

## 2018-05-13 ENCOUNTER — Inpatient Hospital Stay: Payer: Medicaid Other | Attending: Hematology

## 2018-05-13 ENCOUNTER — Other Ambulatory Visit: Payer: Self-pay

## 2018-05-13 DIAGNOSIS — I2699 Other pulmonary embolism without acute cor pulmonale: Secondary | ICD-10-CM | POA: Insufficient documentation

## 2018-05-13 DIAGNOSIS — M25421 Effusion, right elbow: Secondary | ICD-10-CM | POA: Diagnosis not present

## 2018-05-13 DIAGNOSIS — M25522 Pain in left elbow: Secondary | ICD-10-CM | POA: Diagnosis not present

## 2018-05-13 DIAGNOSIS — M25441 Effusion, right hand: Secondary | ICD-10-CM | POA: Insufficient documentation

## 2018-05-13 DIAGNOSIS — M25422 Effusion, left elbow: Secondary | ICD-10-CM | POA: Insufficient documentation

## 2018-05-13 DIAGNOSIS — I82401 Acute embolism and thrombosis of unspecified deep veins of right lower extremity: Secondary | ICD-10-CM | POA: Insufficient documentation

## 2018-05-13 DIAGNOSIS — Z7901 Long term (current) use of anticoagulants: Secondary | ICD-10-CM | POA: Insufficient documentation

## 2018-05-13 DIAGNOSIS — Z7409 Other reduced mobility: Secondary | ICD-10-CM | POA: Insufficient documentation

## 2018-05-13 DIAGNOSIS — M79642 Pain in left hand: Secondary | ICD-10-CM | POA: Diagnosis not present

## 2018-05-13 DIAGNOSIS — F329 Major depressive disorder, single episode, unspecified: Secondary | ICD-10-CM | POA: Insufficient documentation

## 2018-05-13 DIAGNOSIS — M25551 Pain in right hip: Secondary | ICD-10-CM | POA: Diagnosis not present

## 2018-05-13 DIAGNOSIS — M79641 Pain in right hand: Secondary | ICD-10-CM | POA: Insufficient documentation

## 2018-05-13 DIAGNOSIS — R591 Generalized enlarged lymph nodes: Secondary | ICD-10-CM | POA: Diagnosis not present

## 2018-05-13 DIAGNOSIS — M25442 Effusion, left hand: Secondary | ICD-10-CM | POA: Diagnosis not present

## 2018-05-13 DIAGNOSIS — R599 Enlarged lymph nodes, unspecified: Secondary | ICD-10-CM

## 2018-05-13 LAB — CBC WITH DIFFERENTIAL (CANCER CENTER ONLY)
Abs Immature Granulocytes: 0.02 10*3/uL (ref 0.00–0.07)
Basophils Absolute: 0 10*3/uL (ref 0.0–0.1)
Basophils Relative: 0 %
Eosinophils Absolute: 0.3 10*3/uL (ref 0.0–0.5)
Eosinophils Relative: 6 %
HCT: 32.5 % — ABNORMAL LOW (ref 36.0–46.0)
Hemoglobin: 10 g/dL — ABNORMAL LOW (ref 12.0–15.0)
Immature Granulocytes: 0 %
Lymphocytes Relative: 27 %
Lymphs Abs: 1.2 10*3/uL (ref 0.7–4.0)
MCH: 27.9 pg (ref 26.0–34.0)
MCHC: 30.8 g/dL (ref 30.0–36.0)
MCV: 90.5 fL (ref 80.0–100.0)
Monocytes Absolute: 0.5 10*3/uL (ref 0.1–1.0)
Monocytes Relative: 10 %
Neutro Abs: 2.5 10*3/uL (ref 1.7–7.7)
Neutrophils Relative %: 57 %
Platelet Count: 289 10*3/uL (ref 150–400)
RBC: 3.59 MIL/uL — ABNORMAL LOW (ref 3.87–5.11)
RDW: 14.4 % (ref 11.5–15.5)
WBC Count: 4.5 10*3/uL (ref 4.0–10.5)
nRBC: 0 % (ref 0.0–0.2)

## 2018-05-13 LAB — CMP (CANCER CENTER ONLY)
ALT: 8 U/L (ref 0–44)
AST: 11 U/L — ABNORMAL LOW (ref 15–41)
Albumin: 3.6 g/dL (ref 3.5–5.0)
Alkaline Phosphatase: 58 U/L (ref 38–126)
Anion gap: 9 (ref 5–15)
BUN: 13 mg/dL (ref 6–20)
CO2: 24 mmol/L (ref 22–32)
Calcium: 9.5 mg/dL (ref 8.9–10.3)
Chloride: 108 mmol/L (ref 98–111)
Creatinine: 0.89 mg/dL (ref 0.44–1.00)
GFR, Est AFR Am: >60 mL/min (ref >60)
GFR, Estimated: >60 mL/min (ref >60)
Glucose, Bld: 115 mg/dL — ABNORMAL HIGH (ref 70–99)
Potassium: 3.4 mmol/L — ABNORMAL LOW (ref 3.5–5.1)
Sodium: 141 mmol/L (ref 135–145)
Total Bilirubin: 0.8 mg/dL (ref 0.3–1.2)
Total Protein: 8 g/dL (ref 6.5–8.1)

## 2018-05-13 LAB — LACTATE DEHYDROGENASE: LDH: 206 U/L — ABNORMAL HIGH (ref 98–192)

## 2018-05-15 NOTE — Progress Notes (Signed)
[place]   [phone] [phone]   Clinic Follow up Note   Patient Care Team: Gildardo Pounds, NP as PCP - General (Nurse Practitioner) Danie Binder, MD as Consulting Physician (Gastroenterology) Juan Quam, MD as Referring Physician (Orthopedic Surgery) 05/16/2018  CHIEF COMPLAINT: F/u for PE and Lymphadenopathy    Hematological History: --Event #1,PE -Nov 2018:Patient presented to the emergency room with pleuritic chest pain, fever, shortness of breath and urinary incontinence with shortness of breath and chest pain progressing over several days. This was associated with bilateral calf pain and development of sacral decubitus ulcers due to decreased mobility. --d-dimer 8.43on 01/25/17 --ECHO:01/26/17: LVEF 65-70%, normal right ventricular cavity size, normal wall thickness, and normal systolic function. Normal IVC caliber. --Doppler US BL LE, 01/26/17:No evidence of deep vein thrombosis. --CTA chest, 01/26/17:No evidence of hilaror mediastinal lymphadenopathy. Anterior right pulmonary lobe consolidation. Positive for pulmonary emboli in the Ltlower lobe. Subsegmental and lingulararteries pathological right axillary #2lymphadenopathy. --Mammogram BL, 05/24/17:No evidence of malignancy, but prominent axillary lymph nodes identified. --Rt Axillary LN Bx, 06/01/17:Reactive lymphoid hyperplasia. Flow cytometry negative for evidence of monoclonal lymphoproliferative process.   CURRENT THERAPY:  On Eliquis 2.5mg  twice daily Indefinitely   INTERVAL HISTORY: [name] is a 53 y.o. female who is here for follow-up. She had a CT CAP on 05/06/2018 and the findings remain concerning for lymphoproliferative disorder.  Today, she is here with her family member. She describes pain radiating from her toes up to her legs and from her fingers down to her arms. She rates the pain in  severity a 7/10. She is currently on a wheelchair and not able to walk due to the swelling and pain. She is able to ambulate with a walker. For the pain she is taking Tylenol and it helps some day. She describes feeling depressed but does not have harmful thoughts.    Pertinent positives and negatives of review of systems are listed and detailed within the above HPI.  REVIEW OF SYSTEMS:  Constitutional: Denies fevers, chills or abnormal weight loss, (+) diffused joint pain, (+) depression  Eyes: Denies blurriness of vision Ears, nose, mouth, throat, and face: Denies mucositis or sore throat Respiratory: Denies cough, dyspnea or wheezes Cardiovascular: Denies palpitation, chest discomfort, (+) severe lower extremity swelling Gastrointestinal:  Denies nausea, heartburn or change in bowel habits Skin: Denies abnormal skin rashes Lymphatics: Denies new lymphadenopathy or easy bruising Neurological:Denies numbness, tingling or new weaknesses Behavioral/Psych: Mood is stable, no new changes  All other systems were reviewed with the patient and are negative.  MEDICAL HISTORY:  Past Medical History:  Diagnosis Date  . Anemia   . Anxiety   . Asthma    hx as child - no prob as adult - no inhaler  . Blood transfusion 01/22/11   transfusion 2 units at [facility]  . Depression 08/2010   psych assessment  . Dyspnea   . Fibroid   . Headache(784.0)    rx for imitrex - last one jan  . Keloid   . Pulmonary embolism ([place])     SURGICAL HISTORY: Past Surgical History:  Procedure Laterality Date  . ABDOMINAL HYSTERECTOMY    . COLONOSCOPY N/A 11/08/2017   Procedure: COLONOSCOPY;  Surgeon: Danie Binder, MD;  [facility]: [facility];  Service: Endoscopy;  Laterality: N/A;  2:00pm  . FLEXIBLE SIGMOIDOSCOPY N/A 09/17/2017   Procedure: FLEXIBLE SIGMOIDOSCOPY;  Surgeon: Danie Binder, MD;  Location: [facility];  Service: Endoscopy;  Laterality: N/A;  . HERNIA REPAIR  0932   umbilical  . POLYPECTOMY   11/08/2017   Procedure: POLYPECTOMY;  Surgeon: Danie Binder, MD;  Location: [facility];  Service: Endoscopy;;  ascending colon (CSx1), transverse colon (CS x1), splenic flexure (HSx1)  . SVD     Spontaneous vaginal delivery; x 1    I have reviewed the social history and family history with the patient and they are unchanged from previous note.  ALLERGIES:  is allergic to bee venom; augmentin [amoxicillin-pot clavulanate]; latex; and nulytely [peg 3350-kcl-na bicarb-nacl].  MEDICATIONS:  Current Outpatient Medications  Medication Sig Dispense Refill  . acetaminophen (TYLENOL) 500 MG tablet Take 1,500 mg by mouth every 6 (six) hours as needed for moderate pain.    Marland Kitchen amLODipine (NORVASC) 5 MG tablet Take 1 tablet (5 mg total) by mouth daily. 30 tablet 1  . apixaban (ELIQUIS) 5 MG TABS tablet Take 1 tablet (5 mg total) by mouth 2 (two) times daily. 60 tablet 4  . furosemide (LASIX) 20 MG tablet Take 1 tablet (20 mg total) by mouth daily. 30 tablet 1  . mirabegron ER (MYRBETRIQ) 25 MG TB24 tablet Take 25 mg by mouth daily.    . Vitamins A & D (VITAMIN A & D) ointment Apply 1 application topically daily as needed for dry skin.    . DULoxetine (CYMBALTA) 20 MG capsule Take 1 capsule (20 mg total) by mouth daily. 30 capsule 1   No current facility-administered medications for this visit.     PHYSICAL EXAMINATION: ECOG PERFORMANCE STATUS: 3 - Symptomatic, >50% confined to bed  Vitals:   05/16/18 1334  BP: (!) 151/71  Pulse: 91  Resp: 17  Temp: 98 F (36.7 C)  SpO2: 100%   Filed Weights   05/16/18 1334  Weight: (!) 300 lb 14.4 oz (136.5 kg)    GENERAL:alert, no distress and comfortable, (+) on a wheelchair  SKIN: skin color, texture, turgor are normal, no rashes or significant lesions EYES: normal, Conjunctiva are pink and non-injected, sclera clear OROPHARYNX:no exudate, no erythema and lips, buccal mucosa, and tongue normal  NECK: supple, thyroid normal size, non-tender,  without nodularity LYMPH:  no palpable lymphadenopathy in the cervical, axillary or inguinal LUNGS: clear to auscultation and percussion with normal breathing effort HEART: regular rate & rhythm and no murmurs , (+) severe b/l lower extremity edema ABDOMEN:abdomen soft, non-tender and normal bowel sounds Musculoskeletal:no cyanosis of digits and no clubbing  NEURO: alert & oriented x 3 with fluent speech, no focal motor/sensory deficits  LABORATORY DATA:  I have reviewed the data as listed CBC Latest Ref Rng & Units 05/13/2018 03/18/2018 03/06/2018  WBC 4.0 - 10.5 K/uL 4.5 6.3 5.9  Hemoglobin 12.0 - 15.0 g/dL 10.0(L) 11.0(L) 10.9(L)  Hematocrit 36.0 - 46.0 % 32.5(L) 35.1(L) 36.1  Platelets 150 - 400 K/uL 289 325 290     CMP Latest Ref Rng & Units 05/13/2018 03/18/2018 03/06/2018  Glucose 70 - 99 mg/dL 115(H) 124(H) 92  BUN 6 - 20 mg/dL 13 11 14   Creatinine 0.44 - 1.00 mg/dL 0.89 0.80 0.73  Sodium 135 - 145 mmol/L 141 141 140  Potassium 3.5 - 5.1 mmol/L 3.4(L) 3.7 3.6  Chloride 98 - 111 mmol/L 108 107 108  CO2 22 - 32 mmol/L 24 26 25   Calcium 8.9 - 10.3 mg/dL 9.5 9.8 9.7  Total Protein 6.5 - 8.1 g/dL 8.0 8.4(H) -  Total Bilirubin 0.3 - 1.2 mg/dL 0.8 0.7 -  Alkaline  Phos 38 - 126 U/L 58 64 -  AST 15 - 41 U/L 11(L) 18 -  ALT 0 - 44 U/L 8 22 -      RADIOGRAPHIC STUDIES: I have personally reviewed the radiological images as listed and agreed with the findings in the report.  05/06/2018 CT CAP W contrast  IMPRESSION: 1. Lymphadenopathy in the axillary regions bilaterally, inguinal regions bilaterally, and in the left common femoral and bilateral external iliac nodal stations, as detailed above. Findings remain concerning for lymphoproliferative disorder. Further outpatient clinical evaluation is recommended. 2. No acute findings are noted in the chest, abdomen or pelvis. 3. Multiple small 2-3 mm pulmonary nodules scattered throughout the lungs bilaterally, stable compared to the  prior study and considered definitively benign. 4. Additional incidental findings, as above.  ASSESSMENT & PLAN:  [name] is a 53 y.o. female with history of   1. PE ( Pulmonary embolism)in 12/2016, R leg DVT in 11/2017 when off Eliquis for 3 days -Initial event occurred 12/2016 likelyprovoked byherlimited mobilitysecondary to significant left knee injury  -She had a one spontaneous abortion, lupus anticoagulant was checked in April 2019, which was negative. Will obtain further lab work including Cardiolipin antibody, and beta-2 glycoprotein to rule out antiphospholipid syndrome - She had a right LE DVT episode in 11/2017 when she was off Eliquis for 3 days.  -Due to recurrent blood clot, morbid obesity and low mobility we previously discussed she will have to remain on anticoagulation indefinitely now. - Labs reviewed from 05/13/2018, she is slightly anemic, CBC showed RBC 3.59, Hg 10, HCT 32.5  - Her BP was elevated today at 151/71, I encouraged her to monitor her BP at home and if it remains elevated to increase her HTN medication.  - F/u in 3 months   2. DiffuseLymphadenopathy with Significant B/l leg swelling -4/5/19leftaxillary lymph node biopsy found this to be Reactive lymphoid hyperplasia. Flow cytometry negative for evidence of monoclonal lymphoproliferative process. -06/29/17 CT showed b/llymphnode and inguinal adenopathy, lymphoproliferative disorder not excluded. No otherthoracic or abdominallymph nodes seen as enlarged.  -She is currently on Lasix, will continue  -She had a CT CAP on 05/06/2018 which again showed diffuse adenopathy, slightly enlarged compared to last scan.  And was concerning for lymphoproliferative disorder, particularly indolent lymphoma. -I have reviewed her case in our GI tumor board, surgical versus core needle lymph node biopsy were discussed, and the tumor board recommend try core needle biopsy again.  Due to the current [place]  pandemic, will schedule her biopsy in 2 to 3 months - I recommend her to see a rheumatologist but she is currently waiting on her Medicaid   3. Urinary Incontinence  -She is currently on Ditropan and she wears diapers  - I previously encoura-ged her to see PT for pelvic floor exercises   4. Decreased Mobility due to Left Knee Injury  -She has left knee injury which requires surgery and is mostly wheelchair bound due to this.  -She will continue to follow up with orthopedic surgeon Dr. Landry Dyke  -Her Knee surgery has been put off until she get insurance.   5. Mild swelling of joints in hands and elbows, with pain -I recommend she follow up with PCP -She plans to see RA soon for evaluation for possible Rheumatoid arthritis -  She is able to ambulate with a walker  - I prescribe Cymbalta 20mg   for her pain  - I recommended trying a trial of low dose steroids and  suggested her to talk to her PCP [facility]   6. Genetic Testing, Cancer Screenings -Given her extensive paternal and maternal history of GI cancer she is eligible for genetic testing. She is interested but only has [employer], no insurance. She will wait until she gets Medicaid -04/2017 Mammogram was benign, will continue annual screening  -10/2017 colonoscopy showed 4 benign tubular adenomas    PLAN: - I prescribed Cymbalta 20 mg daily for her body pain and depression  - Lab and f/u in 3 months with right axillary node core needle biopsy by IR in 2-3 months  -I signed 6 months handicap plate application for her     No problem-specific Assessment & Plan notes found for this encounter.   Orders Placed This Encounter  Procedures  . [place] AXILLARY NODE CORE BIOPSY RIGHT    Imaging Changes in Response to COVID-19:  [facility] are committed to keeping our patients and their families safe. We will continue CT, MRI, Ultrasound, Diagnostic Radiography, and Diagnostic Mammography exams at SPECIFIC sites  for patients who urgently need them. As part of our efforts to limit the spread of COVID-19 in our communities, effective immediately, we are suspending elective imaging and procedures for those cases where postponement will not cause harm to the patient if it is not performed within the next 4 weeks. If you feel that your patient has imaging or procedure needs that we are not scheduling, please call [phone] and we will do our best to accommodate.    Standing Status:   Future    Standing Expiration Date:   07/16/2019    Scheduling Instructions:     Non urgent, ok to schedule in 2-3 months    Order Specific Question:   Reason for Exam (SYMPTOM  OR DIAGNOSIS REQUIRED)    Answer:   lymphoadenopathy    Order Specific Question:   Preferred imaging location?    Answer:   [facility]   All questions were answered. The patient knows to call the clinic with any problems, questions or concerns. No barriers to learning was detected. I spent 20 minutes counseling the patient face to face. The total time spent in the appointment was 30 minutes and more than 50% was on counseling and review of test results  I, Manson Allan am acting as scribe for Dr. Truitt Merle.  I have reviewed the above documentation for accuracy and completeness, and I agree with the above.     Truitt Merle, MD 05/16/2018

## 2018-05-16 ENCOUNTER — Telehealth (HOSPITAL_COMMUNITY): Payer: Self-pay

## 2018-05-16 ENCOUNTER — Encounter: Payer: Self-pay | Admitting: Hematology

## 2018-05-16 ENCOUNTER — Other Ambulatory Visit: Payer: Self-pay

## 2018-05-16 ENCOUNTER — Inpatient Hospital Stay (HOSPITAL_BASED_OUTPATIENT_CLINIC_OR_DEPARTMENT_OTHER): Payer: Medicaid Other | Admitting: Hematology

## 2018-05-16 ENCOUNTER — Telehealth: Payer: Self-pay | Admitting: Hematology

## 2018-05-16 VITALS — BP 151/71 | HR 91 | Temp 98.0°F | Resp 17 | Ht 72.0 in | Wt 300.9 lb

## 2018-05-16 DIAGNOSIS — M25422 Effusion, left elbow: Secondary | ICD-10-CM

## 2018-05-16 DIAGNOSIS — M79642 Pain in left hand: Secondary | ICD-10-CM

## 2018-05-16 DIAGNOSIS — I2699 Other pulmonary embolism without acute cor pulmonale: Secondary | ICD-10-CM

## 2018-05-16 DIAGNOSIS — M79641 Pain in right hand: Secondary | ICD-10-CM

## 2018-05-16 DIAGNOSIS — Z7901 Long term (current) use of anticoagulants: Secondary | ICD-10-CM

## 2018-05-16 DIAGNOSIS — M25441 Effusion, right hand: Secondary | ICD-10-CM

## 2018-05-16 DIAGNOSIS — F329 Major depressive disorder, single episode, unspecified: Secondary | ICD-10-CM

## 2018-05-16 DIAGNOSIS — M25551 Pain in right hip: Secondary | ICD-10-CM

## 2018-05-16 DIAGNOSIS — Z7409 Other reduced mobility: Secondary | ICD-10-CM

## 2018-05-16 DIAGNOSIS — R591 Generalized enlarged lymph nodes: Secondary | ICD-10-CM

## 2018-05-16 DIAGNOSIS — M25421 Effusion, right elbow: Secondary | ICD-10-CM

## 2018-05-16 DIAGNOSIS — I82401 Acute embolism and thrombosis of unspecified deep veins of right lower extremity: Secondary | ICD-10-CM

## 2018-05-16 DIAGNOSIS — M25442 Effusion, left hand: Secondary | ICD-10-CM

## 2018-05-16 DIAGNOSIS — R599 Enlarged lymph nodes, unspecified: Secondary | ICD-10-CM

## 2018-05-16 DIAGNOSIS — M25522 Pain in left elbow: Secondary | ICD-10-CM

## 2018-05-16 MED ORDER — DULOXETINE HCL 20 MG PO CPEP: 20.0000 mg | ORAL_CAPSULE | Freq: Every day | ORAL | 1 refills | Status: DC

## 2018-05-16 MED FILL — DULoxetine HCL 20 MG CPEP: 20 | 30 days supply | Qty: 30 | Fill #0

## 2018-05-16 NOTE — Telephone Encounter (Signed)
Scheduled appt per 3/19 los. ° °Printed calendar and avs. °

## 2018-06-03 ENCOUNTER — Other Ambulatory Visit: Payer: Self-pay | Admitting: Nurse Practitioner

## 2018-06-04 MED FILL — $ELIQUIS 5 MG TABLET: 5 | 60 days supply | Qty: 120 | Fill #0

## 2018-06-11 MED FILL — DULoxetine HCL 20 MG CPEP: 20 | 30 days supply | Qty: 30 | Fill #1

## 2018-07-02 ENCOUNTER — Ambulatory Visit (HOSPITAL_COMMUNITY): Payer: Self-pay

## 2018-07-12 ENCOUNTER — Other Ambulatory Visit: Payer: Self-pay | Admitting: Hematology

## 2018-07-15 MED FILL — DULoxetine HCL 20 MG CPEP: 20 | 30 days supply | Qty: 30 | Fill #0

## 2018-08-02 ENCOUNTER — Other Ambulatory Visit: Payer: Self-pay

## 2018-08-02 ENCOUNTER — Telehealth: Payer: Self-pay

## 2018-08-02 NOTE — Progress Notes (Signed)
Called in Lovenox 150 mg SQ give herself once daily on 6/6 and 6/7 into [facility] and [address]

## 2018-08-02 NOTE — Telephone Encounter (Addendum)
Left message for patient to call back regarding her biopsy on Tuesday 6/9   I called her back again at 12:27 regarding her upcoming biopsy on Tuesday 6/9.  Per Dr. Burr Medico she is to take her last dose of Eliquis on 6/5, give herself Lovenox injection SQ on Saturday and Sunday, restart Eliquis on 6/10 the day after her biopsy.  She verbalized an understanding.    [facility] does not have this in stock.  Called into [facility].  Lovenox 150 mg SQ once on 6/6 and once 6/7  Called patient back to let her know where it was sent in.  She verbalized an understanding.

## 2018-08-05 ENCOUNTER — Other Ambulatory Visit: Payer: Self-pay | Admitting: Radiology

## 2018-08-06 ENCOUNTER — Other Ambulatory Visit: Payer: Self-pay | Admitting: Radiology

## 2018-08-06 ENCOUNTER — Ambulatory Visit (HOSPITAL_COMMUNITY)
Admission: RE | Admit: 2018-08-06 | Discharge: 2018-08-06 | Disposition: A | Discharge status: Home / Self Care | Payer: Self-pay | Source: Ambulatory Visit | Attending: Hematology | Admitting: Hematology

## 2018-08-06 ENCOUNTER — Ambulatory Visit (HOSPITAL_COMMUNITY): Payer: Self-pay

## 2018-08-06 ENCOUNTER — Encounter (HOSPITAL_COMMUNITY): Payer: Self-pay

## 2018-08-06 ENCOUNTER — Other Ambulatory Visit: Payer: Self-pay

## 2018-08-06 DIAGNOSIS — D649 Anemia, unspecified: Secondary | ICD-10-CM | POA: Insufficient documentation

## 2018-08-06 DIAGNOSIS — Z86711 Personal history of pulmonary embolism: Secondary | ICD-10-CM | POA: Insufficient documentation

## 2018-08-06 DIAGNOSIS — F419 Anxiety disorder, unspecified: Secondary | ICD-10-CM | POA: Insufficient documentation

## 2018-08-06 DIAGNOSIS — F329 Major depressive disorder, single episode, unspecified: Secondary | ICD-10-CM | POA: Insufficient documentation

## 2018-08-06 DIAGNOSIS — R0600 Dyspnea, unspecified: Secondary | ICD-10-CM | POA: Insufficient documentation

## 2018-08-06 DIAGNOSIS — R591 Generalized enlarged lymph nodes: Secondary | ICD-10-CM | POA: Insufficient documentation

## 2018-08-06 DIAGNOSIS — Z7901 Long term (current) use of anticoagulants: Secondary | ICD-10-CM | POA: Insufficient documentation

## 2018-08-06 DIAGNOSIS — R599 Enlarged lymph nodes, unspecified: Secondary | ICD-10-CM

## 2018-08-06 DIAGNOSIS — Z79899 Other long term (current) drug therapy: Secondary | ICD-10-CM | POA: Insufficient documentation

## 2018-08-06 LAB — CBC WITH DIFFERENTIAL/PLATELET
Abs Immature Granulocytes: 0.02 10*3/uL (ref 0.00–0.07)
Basophils Absolute: 0 10*3/uL (ref 0.0–0.1)
Basophils Relative: 1 %
Eosinophils Absolute: 0.3 10*3/uL (ref 0.0–0.5)
Eosinophils Relative: 7 %
HCT: 33.4 % — ABNORMAL LOW (ref 36.0–46.0)
Hemoglobin: 10.3 g/dL — ABNORMAL LOW (ref 12.0–15.0)
Immature Granulocytes: 1 %
Lymphocytes Relative: 22 %
Lymphs Abs: 1 10*3/uL (ref 0.7–4.0)
MCH: 27.9 pg (ref 26.0–34.0)
MCHC: 30.8 g/dL (ref 30.0–36.0)
MCV: 90.5 fL (ref 80.0–100.0)
Monocytes Absolute: 0.5 10*3/uL (ref 0.1–1.0)
Monocytes Relative: 11 %
Neutro Abs: 2.6 10*3/uL (ref 1.7–7.7)
Neutrophils Relative %: 58 %
Platelets: 304 10*3/uL (ref 150–400)
RBC: 3.69 MIL/uL — ABNORMAL LOW (ref 3.87–5.11)
RDW: 14.6 % (ref 11.5–15.5)
WBC: 4.4 10*3/uL (ref 4.0–10.5)
nRBC: 0 % (ref 0.0–0.2)

## 2018-08-06 LAB — PROTIME-INR
INR: 1.1 (ref 0.8–1.2)
Prothrombin Time: 13.8 seconds (ref 11.4–15.2)

## 2018-08-06 MED ORDER — LIDOCAINE HCL (PF) 1 % IJ SOLN
INTRAMUSCULAR | Status: AC | PRN
  Administered 2018-08-06 (×2): 10 mL

## 2018-08-06 MED ORDER — FENTANYL CITRATE (PF) 100 MCG/2ML IJ SOLN
INTRAMUSCULAR | Status: AC | PRN
  Administered 2018-08-06 (×2): 50 ug via INTRAVENOUS

## 2018-08-06 MED ORDER — MIDAZOLAM HCL 2 MG/2ML IJ SOLN
INTRAMUSCULAR | Status: AC
  Filled 2018-08-06: qty 2

## 2018-08-06 MED ORDER — SODIUM CHLORIDE 0.9 % IV SOLN
INTRAVENOUS | Status: DC
  Administered 2018-08-06: 12:00:00 via INTRAVENOUS

## 2018-08-06 MED ORDER — LIDOCAINE HCL 1 % IJ SOLN
INTRAMUSCULAR | Status: AC
  Filled 2018-08-06: qty 20

## 2018-08-06 MED ORDER — MIDAZOLAM HCL 2 MG/2ML IJ SOLN
INTRAMUSCULAR | Status: AC | PRN
  Administered 2018-08-06 (×2): 1 mg via INTRAVENOUS

## 2018-08-06 MED ORDER — FENTANYL CITRATE (PF) 100 MCG/2ML IJ SOLN
INTRAMUSCULAR | Status: AC
  Filled 2018-08-06: qty 2

## 2018-08-06 NOTE — Discharge Instructions (Signed)
Needle Biopsy, Care After °These instructions tell you how to care for yourself after your procedure. Your doctor may also give you more specific instructions. Call your doctor if you have any problems or questions. °What can I expect after the procedure? °After the procedure, it is common to have: °· Soreness. °· Bruising. °· Mild pain. °Follow these instructions at home: ° °· Return to your normal activities as told by your doctor. Ask your doctor what activities are safe for you. °· Take over-the-counter and prescription medicines only as told by your doctor. °· Wash your hands with soap and water before you change your bandage (dressing). If you cannot use soap and water, use hand sanitizer. °· Follow instructions from your doctor about: °? How to take care of your puncture site. °? When and how to change your bandage. °? When to remove your bandage. °· Check your puncture site every day for signs of infection. Watch for: °? Redness, swelling, or pain. °? Fluid or blood.  °? Pus or a bad smell. °? Warmth. °· Do not take baths, swim, or use a hot tub until your doctor approves. Ask your doctor if you may take showers. You may only be allowed to take sponge baths. °· Keep all follow-up visits as told by your doctor. This is important. °Contact a doctor if you have: °· A fever. °· Redness, swelling, or pain at the puncture site, and it lasts longer than a few days. °· Fluid, blood, or pus coming from the puncture site. °· Warmth coming from the puncture site. °Get help right away if: °· You have a lot of bleeding from the puncture site. °Summary °· After the procedure, it is common to have soreness, bruising, or mild pain at the puncture site. °· Check your puncture site every day for signs of infection, such as redness, swelling, or pain. °· Get help right away if you have severe bleeding from your puncture site. °This information is not intended to replace advice given to you by your health care provider. Make  sure you discuss any questions you have with your health care provider. °Document Released: 01/27/2008 Document Revised: 02/26/2017 Document Reviewed: 02/26/2017 °Elsevier Interactive Patient Education © 2019 [employer] °Moderate Conscious Sedation, Adult, Care After °These instructions provide you with information about caring for yourself after your procedure. Your health care provider may also give you more specific instructions. Your treatment has been planned according to current medical practices, but problems sometimes occur. Call your health care provider if you have any problems or questions after your procedure. °What can I expect after the procedure? °After your procedure, it is common: °· To feel sleepy for several hours. °· To feel clumsy and have poor balance for several hours. °· To have poor judgment for several hours. °· To vomit if you eat too soon. °Follow these instructions at home: °For at least 24 hours after the procedure: ° °· Do not: °? Participate in activities where you could fall or become injured. °? Drive. °? Use heavy machinery. °? Drink alcohol. °? Take sleeping pills or medicines that cause drowsiness. °? Make important decisions or sign legal documents. °? Take care of children on your own. °· Rest. °Eating and drinking °· Follow the diet recommended by your health care provider. °· If you vomit: °? Drink water, juice, or soup when you can drink without vomiting. °? Make sure you have little or no nausea before eating solid foods. °General instructions °· Have a responsible adult stay   with you until you are awake and alert. °· Take over-the-counter and prescription medicines only as told by your health care provider. °· If you smoke, do not smoke without supervision. °· Keep all follow-up visits as told by your health care provider. This is important. °Contact a health care provider if: °· You keep feeling nauseous or you keep vomiting. °· You feel light-headed. °· You develop a  rash. °· You have a fever. °Get help right away if: °· You have trouble breathing. °This information is not intended to replace advice given to you by your health care provider. Make sure you discuss any questions you have with your health care provider. °Document Released: 12/04/2012 Document Revised: 07/19/2015 Document Reviewed: 06/05/2015 °Elsevier Interactive Patient Education © 2019 [employer] Inc[employer] ° °

## 2018-08-06 NOTE — Discharge Instructions (Signed)
Moderate Conscious Sedation, Adult, Care After °These instructions provide you with information about caring for yourself after your procedure. Your health care provider may also give you more specific instructions. Your treatment has been planned according to current medical practices, but problems sometimes occur. Call your health care provider if you have any problems or questions after your procedure. °What can I expect after the procedure? °After your procedure, it is common: °· To feel sleepy for several hours. °· To feel clumsy and have poor balance for several hours. °· To have poor judgment for several hours. °· To vomit if you eat too soon. °Follow these instructions at home: °For at least 24 hours after the procedure: ° °· Do not: °? Participate in activities where you could fall or become injured. °? Drive. °? Use heavy machinery. °? Drink alcohol. °? Take sleeping pills or medicines that cause drowsiness. °? Make important decisions or sign legal documents. °? Take care of children on your own. °· Rest. °Eating and drinking °· Follow the diet recommended by your health care provider. °· If you vomit: °? Drink water, juice, or soup when you can drink without vomiting. °? Make sure you have little or no nausea before eating solid foods. °General instructions °· Have a responsible adult stay with you until you are awake and alert. °· Take over-the-counter and prescription medicines only as told by your health care provider. °· If you smoke, do not smoke without supervision. °· Keep all follow-up visits as told by your health care provider. This is important. °Contact a health care provider if: °· You keep feeling nauseous or you keep vomiting. °· You feel light-headed. °· You develop a rash. °· You have a fever. °Get help right away if: °· You have trouble breathing. °This information is not intended to replace advice given to you by your health care provider. Make sure you discuss any questions you have  with your health care provider. °Document Released: 12/04/2012 Document Revised: 07/19/2015 Document Reviewed: 06/05/2015 °Elsevier Interactive Patient Education © 2019 Elsevier Inc. ° °

## 2018-08-06 NOTE — Procedures (Signed)
Interventional Radiology Procedure Note  Procedure: US Guided Biopsy of right axillary lymph node  Complications: None  Estimated Blood Loss: < 10 mL  Findings: 80 G core biopsy of right axillary lymph node performed under US guidance.  Five core samples obtained and sent to Pathology.  Venetia Night. Kathlene Cote, M.D Pager:  [phone]

## 2018-08-06 NOTE — Consult Note (Addendum)
Chief Complaint: Patient was seen in consultation today for image guided core biopsy of axillary lymph node  Referring Physician(s): Feng,Yan  Supervising Physician: Aletta Edouard  Patient Status: [name] - Out-pt  History of Present Illness: [name] is a 53 y.o. female with prior history of PE in 2018, right lower extremity DVT 2019 (currently on Eliquis and Lovenox) with continued severe bilateral lower extremity edema and left axillary lymph node biopsy on 06/01/2017 at [place] which revealed reactive hyperplasia.  CT chest/abd/pelvis from 05/06/2018 revealed:  1. Lymphadenopathy in the axillary regions bilaterally, inguinal regions bilaterally, and in the left common femoral and bilateral external iliac nodal stations, as detailed above. Findings remain concerning for lymphoproliferative disorder. Further outpatient clinical evaluation is recommended. 2. No acute findings are noted in the chest, abdomen or pelvis. 3. Multiple small 2-3 mm pulmonary nodules scattered throughout the lungs bilaterally, stable compared to the prior study and considered definitively benign.  She presents today for image guided  axillary lymph node core biopsy for further evaluation.  She has no known history of cancer.  Past Medical History:  Diagnosis Date  . Anemia   . Anxiety   . Asthma    hx as child - no prob as adult - no inhaler  . Blood transfusion 01/22/11   transfusion 2 units at [facility]  . Depression 08/2010   psych assessment  . Dyspnea   . Fibroid   . Headache(784.0)    rx for imitrex - last one jan  . Keloid   . Pulmonary embolism [facility])     Past Surgical History:  Procedure Laterality Date  . ABDOMINAL HYSTERECTOMY    . COLONOSCOPY N/A 11/08/2017   Procedure: COLONOSCOPY;  Surgeon: Danie Binder, MD;  [facility]: [facility];  Service: Endoscopy;  Laterality: N/A;  2:00pm  . FLEXIBLE SIGMOIDOSCOPY N/A 09/17/2017   Procedure: FLEXIBLE SIGMOIDOSCOPY;  Surgeon:  Danie Binder, MD;  Location: [facility] END[facility];  Service: Endoscopy;  Laterality: N/A;  . HERNIA REPAIR  [id number]   umbilical  . POLYPECTOMY  11/08/2017   Procedure: POLYPECTOMY;  Surgeon: Danie Binder, MD;  Location: [facility] ENDO SU[facility];  Service: Endoscopy;;  ascending colon (CSx1), transverse colon (CS x1), splenic flexure (HSx1)  . SVD     Spontaneous vaginal delivery; x 1    Allergies: Bee venom; Augmentin [amoxicillin-pot clavulanate]; Latex; and Nulytely [peg 2229-NLG-XQ bicarb-nacl]  Medications: Prior to Admission medications   Medication Sig Start Date End Date Taking? Authorizing Provider  acetaminophen (TYLENOL) 500 MG tablet Take 1,500 mg by mouth every 6 (six) hours as needed for moderate pain.    [provider]  amLODipine (NORVASC) 5 MG tablet Take 1 tablet (5 mg total) by mouth daily. 03/06/18   Daleen Bo, MD  DULoxetine (CYMBALTA) 20 MG capsule TAKE 1 CAPSULE (20 MG TOTAL) BY MOUTH DAILY. 07/15/18   Truitt Merle, MD  ELIQUIS 5 MG TABS tablet TAKE 1 TABLET (5 MG TOTAL) BY MOUTH 2 (TWO) TIMES DAILY. 06/03/18   Gildardo Pounds, NP  furosemide (LASIX) 20 MG tablet Take 1 tablet (20 mg total) by mouth daily. 03/09/17   Brayton Caves, PA-C  mirabegron ER (MYRBETRIQ) 25 MG TB24 tablet Take 25 mg by mouth daily.    [provider]  Vitamins A & D (VITAMIN A & D) ointment Apply 1 application topically daily as needed for dry skin.    [provider]     Family History  Problem  Relation Age of Onset  . Heart disease Mother   . Hypertension Mother   . Hypertension Father   . Colon cancer Father 79       Passed away 69 yrs old  . Hypertension Sister   . Cancer Maternal Grandmother        gastric cancer  . Cancer Maternal Grandfather        pancreatic cancer  . Colon cancer Paternal Grandmother   . Colon cancer Paternal Uncle   . Colon polyps Neg Hx     Social History   Socioeconomic History  . Marital status: Single    Spouse name: Not on file  .  Number of children: 1  . Years of education: Not on file  . Highest education level: Not on file  Occupational History  . Occupation: Interior and spatial designer: [employer]  Social Needs  . Financial resource strain: Not on file  . Food insecurity:    Worry: Not on file    Inability: Not on file  . Transportation needs:    Medical: Not on file    Non-medical: Not on file  Tobacco Use  . Smoking status: Never Smoker  . Smokeless tobacco: Never Used  Substance and Sexual Activity  . Alcohol use: No  . Drug use: No  . Sexual activity: Not Currently    Birth control/protection: None, Surgical  Lifestyle  . Physical activity:    Days per week: 7 days    Minutes per session: 30 min  . Stress: Only a little  Relationships  . Social connections:    Talks on phone: More than three times a week    Gets together: Twice a week    Attends religious service: More than 4 times per year    Active member of club or organization: No    Attends meetings of clubs or organizations: Never    Relationship status: Never married  Other Topics Concern  . Not on file  Social History Narrative  . Not on file      Review of Systems denies fever, headache, chest pain, dyspnea, cough, abdominal pain, back pain, nausea, vomiting or bleeding.  She does have left knee discomfort and sig chronic bilateral lower extremity edema  Vital Signs:Blood pressure 165/90, heart rate 108, temp 98.3, respirations 18, O2 sats 92% room air LMP 01/31/2011   Physical Exam awake, alert.  Chest clear to auscultation bilaterally.  Heart with slightly tachycardic but regular rhythm.  Abdomen obese, soft, positive bowel sounds, nontender.  Significant bilateral lower extremity edema  Imaging: No results found.  Labs:  CBC: Recent Labs    12/14/17 2201 03/06/18 1618 03/18/18 1406 05/13/18 1520  WBC 5.4 5.9 6.3 4.5  HGB 10.0* 10.9* 11.0* 10.0*  HCT 32.6* 36.1 35.1* 32.5*  PLT 311 290 325 289    COAGS: No  results for input(s): INR, APTT in the last 8760 hours.  BMP: Recent Labs    12/14/17 2201 03/06/18 1618 03/18/18 1406 05/13/18 1520  NA 142 140 141 141  K 3.7 3.6 3.7 3.4*  CL 112* 108 107 108  CO2 26 25 26 24   GLUCOSE 100* 92 124* 115*  BUN 15 14 11 13   CALCIUM 9.1 9.7 9.8 9.5  CREATININE 0.83 0.73 0.80 0.89  GFRNONAA >60 >60 >60 >60  GFRAA >60 >60 >60 >60    LIVER FUNCTION TESTS: Recent Labs    03/18/18 1406 05/13/18 1520  BILITOT 0.7 0.8  AST 18 11*  ALT 22 8  ALKPHOS 64 58  PROT 8.4* 8.0  ALBUMIN 3.8 3.6    TUMOR MARKERS: No results for input(s): AFPTM, CEA, CA199, CHROMGRNA in the last 8760 hours.  Assessment and Plan:  53 y.o. female with prior history of PE in 2018, right lower extremity DVT 2019 (currently on Eliquis and Lovenox- LD last week) with continued severe bilateral lower extremity edema and left axillary lymph node biopsy on 06/01/2017 at [place] which revealed reactive hyperplasia.  CT chest/abd/pelvis from 05/06/2018 revealed:  1. Lymphadenopathy in the axillary regions bilaterally, inguinal regions bilaterally, and in the left common femoral and bilateral external iliac nodal stations, as detailed above. Findings remain concerning for lymphoproliferative disorder. Further outpatient clinical evaluation is recommended. 2. No acute findings are noted in the chest, abdomen or pelvis. 3. Multiple small 2-3 mm pulmonary nodules scattered throughout the lungs bilaterally, stable compared to the prior study and considered definitively benign.  She presents today for image guided  axillary lymph node core biopsy for further evaluation.  She has no known history of cancer.Risks and benefits of procedure was discussed with the patient  including, but not limited to bleeding, infection, damage to adjacent structures or low yield requiring additional tests.  All of the questions were answered and there is agreement to proceed.  Consent signed and in  chart.  LABS PENDING   Thank you for this interesting consult.  I greatly enjoyed meeting [name] and look forward to participating in their care.  A copy of this report was sent to the requesting provider on this date.  Electronically Signed: D. Rowe Robert, PA-C 08/06/2018, 11:34 AM   I spent a total of 25 minutes   in face to face in clinical consultation, greater than 50% of which was counseling/coordinating care for image guided  axillary lymph node core biopsy

## 2018-08-15 MED FILL — $ELIQUIS 5 MG TABLET: 5 | 90 days supply | Qty: 180 | Fill #1

## 2018-08-15 NOTE — Progress Notes (Signed)
[place]   Telephone:([phone] Fax:([phone]) [phone]   Clinic Follow up Note   Patient Care Team: Gildardo Pounds, NP as PCP - General (Nurse Practitioner) Danie Binder, MD as Consulting Physician (Gastroenterology) Juan Quam, MD as Referring Physician (Orthopedic Surgery)  I connected with [name] on 08/16/2018 at 11:30 AM EDT by video enabled telemedicine visit and verified that I am speaking with the correct person using two identifiers.  I discussed the limitations, risks, security and privacy concerns of performing an evaluation and management service by telephone and the availability of in person appointments. I also discussed with the patient that there may be a patient responsible charge related to this service. The patient expressed understanding and agreed to proceed.   Other persons participating in the visit and their role in the encounter:  Her sister   Patient's location:  Her home  Provider's location:  My office   CHIEF COMPLAINT: F/u for PE and Lymphadenopathy   Hematological History: --Event #1,PE -Nov 2018:Patient presented to the emergency room with pleuritic chest pain, fever, shortness of breath and urinary incontinence with shortness of breath and chest pain progressing over several days. This was associated with bilateral calf pain and development of sacral decubitus ulcers due to decreased mobility. --d-dimer 8.43on 01/25/17 --ECHO:01/26/17: LVEF 65-70%, normal right ventricular cavity size, normal wall thickness, and normal systolic function. Normal IVC caliber. --Doppler US BL LE, 01/26/17:No evidence of deep vein thrombosis. --CTA chest, 01/26/17:No evidence of hilaror mediastinal lymphadenopathy. Anterior right pulmonary lobe consolidation. Positive for pulmonary emboli in the Ltlower lobe. Subsegmental and lingulararteries pathological right axillary  #2lymphadenopathy. --Mammogram BL, 05/24/17:No evidence of malignancy, but prominent axillary lymph nodes identified. --Rt Axillary LN Bx, 06/01/17:Reactive lymphoid hyperplasia. Flow cytometry negative for evidence of monoclonal lymphoproliferative process. --CT CAP from 05/06/18 shows Lymphadenopathy in the axillary regions bilaterally, inguinal regions bilaterally, and in the left common femoral and bilateral external iliac nodal stations, as detailed above. Findings remain concerning for lymphoproliferative disorder. There was also Multiple small 2-3 mm pulmonary nodules scattered throughout the lungs bilaterally, stable compared to the prior study and considered definitively benign. --Right Axillary LN biopsy from 08/06/18 was benign    CURRENT THERAPY:  On Eliquis2.5mg  twicedaily Indefinitely  INTERVAL HISTORY:  ADYSEN RAPHAEL is here for a follow up of PE and lymphadenopathy. She was able to identify herself by face to face video. She notes she did not have issues with her biopsy. She has restarted Eliquis with no signs of bleeding. She denies any new changes. Her LE swelling is still significant. She still has urinary incontinence. She notes her PCP  would like her to see a RA for evaluation. She notes she has orange card but not much other coverage. She has not been abel to be seen by RA.  She notes she walks some with walker. She denies having home care service but has good family support.    REVIEW OF SYSTEMS:   Constitutional: Denies fevers, chills or abnormal weight loss Eyes: Denies blurriness of vision Ears, nose, mouth, throat, and face: Denies mucositis or sore throat Respiratory: Denies cough, dyspnea or wheezes Cardiovascular: Denies palpitation, chest discomfort (+) Significant lower extremity swelling, stable  Gastrointestinal:  Denies nausea, heartburn or change in bowel habits UA: (+) Urinary incontinence  Skin: Denies abnormal skin rashes Lymphatics: Denies new  lymphadenopathy or easy bruising Neurological:Denies numbness, tingling or new weaknesses Behavioral/Psych: Mood is stable, no new changes  All other systems were  reviewed with the patient and are negative.  MEDICAL HISTORY:  Past Medical History:  Diagnosis Date  . Anemia   . Anxiety   . Asthma    hx as child - no prob as adult - no inhaler  . Blood transfusion 01/22/11   transfusion 2 units at [facility]  . Depression 08/2010   psych assessment  . Dyspnea   . Fibroid   . Headache(784.0)    rx for imitrex - last one jan  . Keloid   . Pulmonary embolism ([place])     SURGICAL HISTORY: Past Surgical History:  Procedure Laterality Date  . ABDOMINAL HYSTERECTOMY    . COLONOSCOPY N/A 11/08/2017   Procedure: COLONOSCOPY;  Surgeon: Danie Binder, MD;  [facility];  Service: Endoscopy;  Laterality: N/A;  2:00pm  . FLEXIBLE SIGMOIDOSCOPY N/A 09/17/2017   Procedure: FLEXIBLE SIGMOIDOSCOPY;  Surgeon: Danie Binder, MD;  [facility];  Service: Endoscopy;  Laterality: N/A;  . HERNIA REPAIR  0160   umbilical  . POLYPECTOMY  11/08/2017   Procedure: POLYPECTOMY;  Surgeon: Danie Binder, MD;  [facility];  Service: Endoscopy;;  ascending colon (CSx1), transverse colon (CS x1), splenic flexure (HSx1)  . SVD     Spontaneous vaginal delivery; x 1    I have reviewed the social history and family history with the patient and they are unchanged from previous note.  ALLERGIES:  is allergic to bee venom; augmentin [amoxicillin-pot clavulanate]; latex; and nulytely [peg 3350-kcl-na bicarb-nacl].  MEDICATIONS:  Current Outpatient Medications  Medication Sig Dispense Refill  . acetaminophen (TYLENOL) 500 MG tablet Take 1,500 mg by mouth every 6 (six) hours as needed for moderate pain.    Marland Kitchen amLODipine (NORVASC) 5 MG tablet Take 1 tablet (5 mg total) by mouth daily. 30 tablet 1  . DULoxetine (CYMBALTA) 20 MG capsule TAKE 1 CAPSULE (20 MG TOTAL) BY MOUTH DAILY. 30 capsule  1  . ELIQUIS 5 MG TABS tablet TAKE 1 TABLET (5 MG TOTAL) BY MOUTH 2 (TWO) TIMES DAILY. 60 tablet 4  . furosemide (LASIX) 20 MG tablet Take 1 tablet (20 mg total) by mouth daily. 30 tablet 1  . mirabegron ER (MYRBETRIQ) 25 MG TB24 tablet Take 25 mg by mouth daily.    . Vitamins A & D (VITAMIN A & D) ointment Apply 1 application topically daily as needed for dry skin.     No current facility-administered medications for this visit.     PHYSICAL EXAMINATION: ECOG PERFORMANCE STATUS: 3 - Symptomatic, >50% confined to bed  No vitals taken today, Exam not performed today  LABORATORY DATA:  I have reviewed the data as listed CBC Latest Ref Rng & Units 08/06/2018 05/13/2018 03/18/2018  WBC 4.0 - 10.5 K/uL 4.4 4.5 6.3  Hemoglobin 12.0 - 15.0 g/dL 10.3(L) 10.0(L) 11.0(L)  Hematocrit 36.0 - 46.0 % 33.4(L) 32.5(L) 35.1(L)  Platelets 150 - 400 K/uL 304 289 325     CMP Latest Ref Rng & Units 05/13/2018 03/18/2018 03/06/2018  Glucose 70 - 99 mg/dL 115(H) 124(H) 92  BUN 6 - 20 mg/dL 13 11 14   Creatinine 0.44 - 1.00 mg/dL 0.89 0.80 0.73  Sodium 135 - 145 mmol/L 141 141 140  Potassium 3.5 - 5.1 mmol/L 3.4(L) 3.7 3.6  Chloride 98 - 111 mmol/L 108 107 108  CO2 22 - 32 mmol/L 24 26 25   Calcium 8.9 - 10.3 mg/dL 9.5 9.8 9.7  Total Protein 6.5 - 8.1 g/dL 8.0 8.4(H) -  Total Bilirubin 0.3 - 1.2 mg/dL 0.8 0.7 -  Alkaline Phos 38 - 126 U/L 58 64 -  AST 15 - 41 U/L 11(L) 18 -  ALT 0 - 44 U/L 8 22 -    CT CAP 05/06/18  IMPRESSION: 1. Lymphadenopathy in the axillary regions bilaterally, inguinal regions bilaterally, and in the left common femoral and bilateral external iliac nodal stations, as detailed above. Findings remain concerning for lymphoproliferative disorder. Further outpatient clinical evaluation is recommended. 2. No acute findings are noted in the chest, abdomen or pelvis. 3. Multiple small 2-3 mm pulmonary nodules scattered throughout the lungs bilaterally, stable compared to the prior study  and considered definitively benign. 4. Additional incidental findings, as above.   Diagnosis 08/06/18 Lymph node, needle/core biopsy, right axillary - LYMPH NODE WITH INCREASED HISTIOCYTES - NO CARCINOMA OF EVIDENCE OF LYMPHOMA IDENTIFIED Interpretation Tissue-Flow Cytometry - NO MONOCLONAL B-CELL OR PHENOTYPICALLY ABERRANT T-CELL POPULATION IDENTIFIED - SEE COMMENT   RADIOGRAPHIC STUDIES: I have personally reviewed the radiological images as listed and agreed with the findings in the report. No results found.   ASSESSMENT & PLAN:  YOSELYN MCGLADE is a 53 y.o. female with   1. PE ( Pulmonary embolism)in 12/2016, R leg DVT in 11/2017 when off Eliquis for 3 days -Initial event occurred 12/2016 likelyprovoked byherlimited mobilitysecondary to significant left knee injury  -She had a one spontaneous abortion, lupus anticoagulant was checked in April 2019, which was negative. Will obtain further lab work including Cardiolipin antibody, and beta-2 glycoprotein to rule out antiphospholipid syndrome -She had a right LE DVT episode in 10/2019when she was off Eliquis for 3 days.  -Due to recurrent blood clot, morbid obesity and low mobility we previously discussed she will have to remain on anticoagulation indefinitely unless she develops severe bleeding or other contraindications  -she is tolerating Eliquis very well, will continue.   2. DiffuseLymphadenopathywithSignificant B/l leg lymphoedema -4/5/19leftaxillary lymph node biopsy found this to be Reactive lymphoid hyperplasia. Flow cytometry negative for evidence of monoclonal lymphoproliferative process. -06/29/17 CT showed b/llymphnode and inguinal adenopathy, lymphoproliferative disorder not excluded. No otherthoracic or abdominallymph nodes seen as enlarged.  -She had a CT CAP on 05/06/2018 which again showed diffuse adenopathy, slightly enlarged compared to last scan. And was concerning for lymphoproliferative disorder,  particularly indolent lymphoma. -She had another right axillary LN biopsy from 08/06/18 which was benign and negative of cancer or Lymphoma. I discussed with her, and will mail her a copy of the report  -I discussed her lymphadenopathy and leg swelling is likely reactive, the deferential includes rheumatological disorder and chronic infection.  She did have positive ANA, and centromere antibody and Chromatin Ab SerPl-aCnc in 2018.  Due to her lack of insurance coverage, she has not being able to see rheumatology.  I discussed the out-of-pocket cost for her rheumatology consultation, and she is interested, I will refer her. -I also recommend she is seen by infectious disease doctor given her chronic symptoms. She is agreeable. I will discuss with ID first before the referral  -She is clinically stable with no change in swelling. She was not able to be seen by lymphedema clinic. She has been on oral lasix with little to no change.   3. Urinary Incontinence  -She is currently on Ditropanand she wears diapers -I previously encouraged her to see PT for pelvic floor exercises  -stable   4. Decreased Mobility due to Left Knee Injury  -She has left knee injury which requires surgery and is  mostly wheelchair bound due to this.  -She will continue to follow up with orthopedic surgeon Dr. Landry Dyke -Her Knee surgery has been put off until she get insurance.  5. Mild swelling of joints in hands and elbows, with pain -I recommend she follow up with PCP -She plans to see RA soon for evaluation for possible Rheumatoid arthritis -She is able to ambulate with a walker  -I prescribe Cymbalta 20mg   for her pain  -I recommended trying a trial of low dose steroids and suggested her to talk to her PCP Raul Del   6. Genetic Testing, Cancer Screenings -Given her extensive paternal and maternal history of GI cancer she is eligible for genetic testing. She is interested but only has [employer], no insurance.  She will waituntil she getsMedicaid -04/2017 Mammogram was benign, will continue annual screening  -10/2017 colonoscopy showed 4 benign tubular adenomas   PLAN: -Send her copy of biopsy report  -Send rheumatology referral  -F/u open, as needed    No problem-specific Assessment & Plan notes found for this encounter.   No orders of the defined types were placed in this encounter.  I discussed the assessment and treatment plan with the patient. The patient was provided an opportunity to ask questions and all were answered. The patient agreed with the plan and demonstrated an understanding of the instructions.  The patient was advised to call back or seek an in-person evaluation if the symptoms worsen or if the condition fails to improve as anticipated.  I provided 18 minutes of face-to-face video visit time during this encounter, and > 50% was spent counseling as documented under my assessment & plan.     Truitt Merle, MD 08/16/2018   I, Joslyn Devon, am acting as scribe for Truitt Merle, MD.   I have reviewed the above documentation for accuracy and completeness, and I agree with the above.

## 2018-08-16 ENCOUNTER — Telehealth: Payer: Self-pay

## 2018-08-16 ENCOUNTER — Inpatient Hospital Stay: Payer: Self-pay

## 2018-08-16 ENCOUNTER — Encounter: Payer: Self-pay | Admitting: Hematology

## 2018-08-16 ENCOUNTER — Inpatient Hospital Stay: Payer: Self-pay | Attending: Hematology | Admitting: Hematology

## 2018-08-16 DIAGNOSIS — R591 Generalized enlarged lymph nodes: Secondary | ICD-10-CM

## 2018-08-16 DIAGNOSIS — Z86711 Personal history of pulmonary embolism: Secondary | ICD-10-CM

## 2018-08-16 NOTE — Telephone Encounter (Signed)
Mailed copy of surgical pathology report to patient's home address per Dr. Burr Medico

## 2018-08-20 ENCOUNTER — Other Ambulatory Visit: Payer: Self-pay | Admitting: Hematology

## 2018-08-20 ENCOUNTER — Telehealth: Payer: Self-pay | Admitting: *Deleted

## 2018-08-20 DIAGNOSIS — R591 Generalized enlarged lymph nodes: Secondary | ICD-10-CM

## 2018-08-20 DIAGNOSIS — R599 Enlarged lymph nodes, unspecified: Secondary | ICD-10-CM

## 2018-08-20 NOTE — Telephone Encounter (Signed)
Spoke with pt and informed pt that Dr. Burr Medico has referred pt to ID Dr. Linus Salmons, and rheumatology Dr. Gavin Pound.  Informed pt that if Dr. Trudie Reed office requires labs, pt can come to our office or her PCP office to do labs. Pt will be seen as needed per Dr. Burr Medico.   Pt understood that Dr. Linus Salmons office will accept [facility] card, but Dr. Trudie Reed office will be self pay.

## 2018-08-26 ENCOUNTER — Telehealth: Payer: Self-pay | Admitting: Internal Medicine

## 2018-08-26 NOTE — Telephone Encounter (Signed)
COVID-19 Pre-Screening Questions: ° °Do you currently have a fever (>100 °F), chills or unexplained body aches? No  ° °Are you currently experiencing new cough, shortness of breath, sore throat, runny nose? No  °•  °Have you recently travelled outside the state of [place] in the last 14 days? No  °•  °1. Have you been in contact with someone that is currently pending confirmation of Covid19 testing or has been confirmed to have the Covid19 virus?  No  ° °

## 2018-08-27 ENCOUNTER — Encounter: Payer: Self-pay | Admitting: Internal Medicine

## 2018-08-27 ENCOUNTER — Other Ambulatory Visit: Payer: Self-pay

## 2018-08-27 ENCOUNTER — Ambulatory Visit (INDEPENDENT_AMBULATORY_CARE_PROVIDER_SITE_OTHER): Payer: Self-pay | Admitting: Internal Medicine

## 2018-08-27 VITALS — BP 133/75 | HR 114 | Temp 99.1°F

## 2018-08-27 DIAGNOSIS — R591 Generalized enlarged lymph nodes: Secondary | ICD-10-CM

## 2018-08-27 DIAGNOSIS — R5381 Other malaise: Secondary | ICD-10-CM

## 2018-08-27 DIAGNOSIS — M1990 Unspecified osteoarthritis, unspecified site: Secondary | ICD-10-CM

## 2018-08-27 DIAGNOSIS — R599 Enlarged lymph nodes, unspecified: Secondary | ICD-10-CM

## 2018-08-27 NOTE — Progress Notes (Signed)
[facility]      Reason for Consult: adenopathy    Referring Physician: Dr. Burr Medico    Patient ID: [name], female    DOB: Aug 08, 1965, 53 y.o.   MRN: [id number]  HPI:   Here for evaluation of bilateral lymphadenopathy in axillary regions, inguinal regions and left common femoral and bilateral external iliac nodal areas.  Concern for lymphproliferative disorder and saw Dr. Burr Medico.  Biopsy done 4/19 and was found to be reactive lymphoid hyperplagia and again a biopsy this month and also benign.  No cultures were sent.   Also a history of PE.   This has been associated with bilateral hand, wrist and elbow joint swelling.  No history of this before. Has been present for about the last year.  No associated weight loss.  Lymphadenopathy comes and goes.  She has had significant lymphedema and is not able to walk much now due to swelling of her legs.   Previous record reviewed in Epic including biopsy results.    Past Medical History:  Diagnosis Date  . Anemia   . Anxiety   . Asthma    hx as child - no prob as adult - no inhaler  . Blood transfusion 01/22/11   transfusion 2 units at [facility]  . Depression 08/2010   psych assessment  . Dyspnea   . Fibroid   . Headache(784.0)    rx for imitrex - last one jan  . Keloid   . Pulmonary embolism ([place])     Prior to Admission medications   Medication Sig Start Date End Date Taking? Authorizing Provider  acetaminophen (TYLENOL) 500 MG tablet Take 1,500 mg by mouth every 6 (six) hours as needed for moderate pain.    [provider]  amLODipine (NORVASC) 5 MG tablet Take 1 tablet (5 mg total) by mouth daily. 03/06/18   Daleen Bo, MD  DULoxetine (CYMBALTA) 20 MG capsule TAKE 1 CAPSULE (20 MG TOTAL) BY MOUTH DAILY. 07/15/18   Truitt Merle, MD  ELIQUIS 5 MG TABS tablet TAKE 1 TABLET (5 MG TOTAL) BY MOUTH 2 (TWO) TIMES DAILY. 06/03/18   Gildardo Pounds, NP  furosemide (LASIX) 20 MG tablet Take 1 tablet (20 mg total)  by mouth daily. 03/09/17   Brayton Caves, PA-C  mirabegron ER (MYRBETRIQ) 25 MG TB24 tablet Take 25 mg by mouth daily.    [provider]  Vitamins A & D (VITAMIN A & D) ointment Apply 1 application topically daily as needed for dry skin.    [provider]    Allergies  Allergen Reactions  . Bee Venom Anaphylaxis  . Augmentin [Amoxicillin-Pot Clavulanate] Hives, Itching and Other (See Comments)    Did PCN reaction causing immediate rash, facial/tongue/throat swelling, SOB or lightheadedness with hypotension: yes Did PCN reaction causing severe rash involving mucus membranes or skin necrosis: no Has patient had a PCN reaction that required hospitalization: in hospital Has patient had a PCN reaction occurring within the last 10 years: no If all of the above answers are "NO", then may proceed with Cephalosporin use.  Pt reports no recollection of any reactions when taking penicillin in past  . Latex Hives and Other (See Comments)    Local reaction (welts). Patient denies any wheezing or other reaction with latex exposure  . Nulytely [Peg 3350-Kcl-Na Bicarb-Nacl]     NAUSEA AND VOMITING. MAY TOLERATE LOW VOLUME PREP.    Social History   Tobacco Use  .  Smoking status: Never Smoker  . Smokeless tobacco: Never Used  Substance Use Topics  . Alcohol use: No  . Drug use: No    Family History  Problem Relation Age of Onset  . Heart disease Mother   . Hypertension Mother   . Hypertension Father   . Colon cancer Father 60       Passed away 62 yrs old  . Hypertension Sister   . Cancer Maternal Grandmother        gastric cancer  . Cancer Maternal Grandfather        pancreatic cancer  . Colon cancer Paternal Grandmother   . Colon cancer Paternal Uncle   . Colon polyps Neg Hx     Review of Systems  Constitutional: negative for fevers, chills, anorexia and weight loss Respiratory: negative for cough or sputum Gastrointestinal: negative for nausea and diarrhea  All other systems reviewed and are negative    Constitutional: in no apparent distress, in a wheelchair EYES: anicteric ENMT: no thrush Cardiovascular: Cor RRR Respiratory: CTA B; normal respiratory effort GI: Bowel sounds are normal Musculoskeletal: both legs significantly enlarged c/w lymphedema Both hands with swelling, warmth of MCP joints, wrists and elbows Skin: negatives: no rash Hematologic: no axillary or cervical lad noted  Labs: Lab Results  Component Value Date   WBC 4.4 08/06/2018   HGB 10.3 (L) 08/06/2018   HCT 33.4 (L) 08/06/2018   MCV 90.5 08/06/2018   PLT 304 08/06/2018    Lab Results  Component Value Date   CREATININE 0.89 05/13/2018   BUN 13 05/13/2018   NA 141 05/13/2018   K 3.4 (L) 05/13/2018   CL 108 05/13/2018   CO2 24 05/13/2018    Lab Results  Component Value Date   ALT 8 05/13/2018   AST 11 (L) 05/13/2018   ALKPHOS 58 05/13/2018   BILITOT 0.8 05/13/2018   INR 1.1 08/06/2018     Assessment: unexplained lymphadenopathy, intermittent associated with bilateral joint swelling.  Differential for the lymphadenopathy can be fungal, AFB or atypical infection but would be unlikely to be associated with joint swelling.  Definitive infectious diagnosis would be via biopsy with culture for above.  She did have histiocytes but presentation not typical for [facility] or [facility].  However, I feel rheumatic disease is more likely.  I will hold on doing a biopsy pending evaluation by Dr. Hawkes.  I will do some labs and if no diagnosis encountered, will consider lymph node biopsy. I also will send her to PT since she has become very deconditioned by this.    Plan: 1) labs including HIV,ESR, fungal Ab panel, glycoprotein Follow up after evaluation by rheumatology.   

## 2018-08-29 LAB — BETA-2 GLYCOPROTEIN ANTIBODIES
Beta-2 Glyco 1 IgA: <9 SAU (ref <=20)
Beta-2 Glyco 1 IgM: <9 SMU (ref <=20)
Beta-2 Glyco I IgG: <9 SGU (ref <=20)

## 2018-09-01 LAB — FUNGAL ANTIBODIES PANEL, ID-BLOOD
Aspergillus Flavus AB: NEGATIVE
Aspergillus Niger Antibodies: NEGATIVE
Aspergillus fumigatus: NEGATIVE
Blastomyces Abs, Qn, DID: NEGATIVE
Coccidioides Antibody ID: NEGATIVE
Histoplasma Ab, Immunodiffusion: NEGATIVE

## 2018-09-01 LAB — RPR: RPR Ser Ql: NONREACTIVE

## 2018-09-01 LAB — HEPATITIS B SURFACE ANTIGEN: Hepatitis B Surface Ag: NONREACTIVE

## 2018-09-01 LAB — CARDIOLIPIN ANTIBODIES, IGM+IGG
Anticardiolipin IgG: <14 [GPL'U]
Anticardiolipin IgM: 13 [MPL'U] — ABNORMAL HIGH

## 2018-09-01 LAB — CYCLIC CITRUL PEPTIDE ANTIBODY, IGG: Cyclic Citrullin Peptide Ab: <16 UNITS

## 2018-09-01 LAB — SEDIMENTATION RATE: Sed Rate: 38 mm/h — ABNORMAL HIGH (ref 0–20)

## 2018-09-01 LAB — FERRITIN: Ferritin: 80 ng/mL (ref 16–232)

## 2018-09-01 LAB — RHEUMATOID FACTOR: Rheumatoid fact SerPl-aCnc: 253 IU/mL — ABNORMAL HIGH (ref <14)

## 2018-09-01 LAB — HEPATITIS C ANTIBODY
Hepatitis C Ab: NONREACTIVE
SIGNAL TO CUT-OFF: 0.13 (ref <1.00)

## 2018-09-01 LAB — HIV ANTIBODY (ROUTINE TESTING W REFLEX): HIV 1&2 Ab, 4th Generation: NONREACTIVE

## 2018-09-09 ENCOUNTER — Telehealth: Payer: Self-pay

## 2018-09-09 NOTE — Telephone Encounter (Signed)
Patient made aware of lab results are all negative for any infectious disease concerns. Patient states she has not been able to schedule PT or follow up with rheumatologist due to multiple recent deaths in the family. Patient states she will follow up with ID if needed.  Eugenia Mcalpine, LPN

## 2018-09-09 NOTE — Telephone Encounter (Signed)
-----   Message from Thayer Headings, MD sent at 09/09/2018  8:59 AM EDT ----- Please let her know that the labs done are all negative for any infectious disease concerns.   thanks

## 2018-10-09 ENCOUNTER — Ambulatory Visit: Payer: Self-pay | Admitting: Internal Medicine

## 2018-11-11 ENCOUNTER — Other Ambulatory Visit: Payer: Self-pay | Admitting: Nurse Practitioner

## 2018-11-11 MED FILL — $ELIQUIS 5 MG TABLET: 5 | 30 days supply | Qty: 60 | Fill #0

## 2018-11-18 ENCOUNTER — Telehealth: Payer: Self-pay | Admitting: Nurse Practitioner

## 2018-11-18 NOTE — Telephone Encounter (Signed)
Patient mother called wanting to get information about getting her daughter into rehab and what steps she needs to take. Please follow up.

## 2018-11-21 NOTE — Telephone Encounter (Signed)
Pt. Called back and was given the number to [facility].  Pt. Request if PCP can refer her to a Rheumatologist that accept Medicaid.   Pt. Have Medicaid now.

## 2018-11-21 NOTE — Telephone Encounter (Signed)
Attempt to call the patient back to inform she need to contact the [facility]. They had tried to reach patient to schedule an appt.  No answer and unable to leave a VM due mailbox is full.

## 2018-11-26 ENCOUNTER — Other Ambulatory Visit: Payer: Self-pay | Admitting: Nurse Practitioner

## 2018-11-26 DIAGNOSIS — M059 Rheumatoid arthritis with rheumatoid factor, unspecified: Secondary | ICD-10-CM

## 2018-11-26 NOTE — Telephone Encounter (Signed)
Referral has been placed however insurance coverage still showing none. Can the front desk make sure this is updated

## 2018-12-02 ENCOUNTER — Ambulatory Visit: Payer: Self-pay | Admitting: Nurse Practitioner

## 2018-12-03 ENCOUNTER — Ambulatory Visit: Payer: Self-pay | Attending: Internal Medicine | Admitting: Physical Therapy

## 2018-12-09 NOTE — Telephone Encounter (Signed)
Patients medicaid indicates its inactive, Dr.Deveshwars office called to get an updated insurance card but none are on file. They are willing to see pt if CAFA is updated. Please follow up

## 2018-12-18 ENCOUNTER — Other Ambulatory Visit: Payer: Self-pay | Admitting: Hematology

## 2018-12-18 ENCOUNTER — Telehealth: Payer: Self-pay

## 2018-12-18 MED ORDER — APIXABAN 5 MG PO TABS: 5.0000 mg | ORAL_TABLET | Freq: Two times a day (BID) | ORAL | 2 refills | Status: DC

## 2018-12-18 MED FILL — $ELIQUIS 5 MG TABLET: 5 | 30 days supply | Qty: 60 | Fill #0

## 2018-12-18 NOTE — Telephone Encounter (Signed)
Called patient to let her know that Dr. Burr Medico refilled the Eliquis to [facility].  She verbalized an  Understanding.

## 2018-12-18 NOTE — Telephone Encounter (Signed)
Patient calls wanting to know if Dr. Burr Medico will send in a refill on her Eliquis to [facility]. She has been unable to get in with Z. Fleming NP and she will not fill it until she does.  She only has one pill left.

## 2018-12-18 NOTE — Telephone Encounter (Signed)
I refilled, let her know, thanks   Truitt Merle MD

## 2018-12-20 NOTE — Telephone Encounter (Signed)
Mail a letter to patient to apply for [facility] with application

## 2018-12-31 ENCOUNTER — Emergency Department (HOSPITAL_COMMUNITY): Payer: Medicaid Other

## 2018-12-31 ENCOUNTER — Other Ambulatory Visit: Payer: Self-pay

## 2018-12-31 ENCOUNTER — Inpatient Hospital Stay (HOSPITAL_COMMUNITY)
Admission: EM | Admit: 2018-12-31 | Discharge: 2019-01-06 | DRG: 603 | Disposition: A | Discharge status: Home Health | Payer: Medicaid Other | Attending: Internal Medicine | Admitting: Internal Medicine

## 2018-12-31 ENCOUNTER — Emergency Department (HOSPITAL_BASED_OUTPATIENT_CLINIC_OR_DEPARTMENT_OTHER): Payer: Medicaid Other

## 2018-12-31 DIAGNOSIS — L03115 Cellulitis of right lower limb: Secondary | ICD-10-CM | POA: Diagnosis present

## 2018-12-31 DIAGNOSIS — Z888 Allergy status to other drugs, medicaments and biological substances status: Secondary | ICD-10-CM | POA: Diagnosis not present

## 2018-12-31 DIAGNOSIS — M79605 Pain in left leg: Secondary | ICD-10-CM

## 2018-12-31 DIAGNOSIS — R42 Dizziness and giddiness: Secondary | ICD-10-CM

## 2018-12-31 DIAGNOSIS — Z800 Family history of malignant neoplasm of digestive organs: Secondary | ICD-10-CM | POA: Diagnosis not present

## 2018-12-31 DIAGNOSIS — R9431 Abnormal electrocardiogram [ECG] [EKG]: Secondary | ICD-10-CM | POA: Diagnosis present

## 2018-12-31 DIAGNOSIS — Z86718 Personal history of other venous thrombosis and embolism: Secondary | ICD-10-CM | POA: Diagnosis not present

## 2018-12-31 DIAGNOSIS — Z20828 Contact with and (suspected) exposure to other viral communicable diseases: Secondary | ICD-10-CM | POA: Diagnosis present

## 2018-12-31 DIAGNOSIS — Z91030 Bee allergy status: Secondary | ICD-10-CM

## 2018-12-31 DIAGNOSIS — I313 Pericardial effusion (noninflammatory): Secondary | ICD-10-CM | POA: Diagnosis present

## 2018-12-31 DIAGNOSIS — L0390 Cellulitis, unspecified: Secondary | ICD-10-CM | POA: Diagnosis present

## 2018-12-31 DIAGNOSIS — E876 Hypokalemia: Secondary | ICD-10-CM | POA: Diagnosis present

## 2018-12-31 DIAGNOSIS — I459 Conduction disorder, unspecified: Secondary | ICD-10-CM | POA: Diagnosis present

## 2018-12-31 DIAGNOSIS — L03116 Cellulitis of left lower limb: Principal | ICD-10-CM | POA: Diagnosis present

## 2018-12-31 DIAGNOSIS — R079 Chest pain, unspecified: Secondary | ICD-10-CM

## 2018-12-31 DIAGNOSIS — Z91040 Latex allergy status: Secondary | ICD-10-CM

## 2018-12-31 DIAGNOSIS — R0602 Shortness of breath: Secondary | ICD-10-CM | POA: Diagnosis not present

## 2018-12-31 DIAGNOSIS — Z8249 Family history of ischemic heart disease and other diseases of the circulatory system: Secondary | ICD-10-CM | POA: Diagnosis not present

## 2018-12-31 DIAGNOSIS — R5381 Other malaise: Secondary | ICD-10-CM | POA: Diagnosis present

## 2018-12-31 DIAGNOSIS — Z6841 Body Mass Index (BMI) 40.0 and over, adult: Secondary | ICD-10-CM | POA: Diagnosis not present

## 2018-12-31 DIAGNOSIS — A419 Sepsis, unspecified organism: Secondary | ICD-10-CM | POA: Diagnosis present

## 2018-12-31 DIAGNOSIS — I2699 Other pulmonary embolism without acute cor pulmonale: Secondary | ICD-10-CM | POA: Diagnosis present

## 2018-12-31 DIAGNOSIS — Z7901 Long term (current) use of anticoagulants: Secondary | ICD-10-CM | POA: Diagnosis not present

## 2018-12-31 DIAGNOSIS — R000 Tachycardia, unspecified: Secondary | ICD-10-CM

## 2018-12-31 DIAGNOSIS — M7989 Other specified soft tissue disorders: Secondary | ICD-10-CM

## 2018-12-31 DIAGNOSIS — Z86711 Personal history of pulmonary embolism: Secondary | ICD-10-CM | POA: Diagnosis not present

## 2018-12-31 DIAGNOSIS — I209 Angina pectoris, unspecified: Secondary | ICD-10-CM | POA: Diagnosis present

## 2018-12-31 DIAGNOSIS — Z881 Allergy status to other antibiotic agents status: Secondary | ICD-10-CM

## 2018-12-31 DIAGNOSIS — R52 Pain, unspecified: Secondary | ICD-10-CM

## 2018-12-31 DIAGNOSIS — I890 Lymphedema, not elsewhere classified: Secondary | ICD-10-CM

## 2018-12-31 LAB — CBC
HCT: 37.4 % (ref 36.0–46.0)
Hemoglobin: 11.2 g/dL — ABNORMAL LOW (ref 12.0–15.0)
MCH: 27.7 pg (ref 26.0–34.0)
MCHC: 29.9 g/dL — ABNORMAL LOW (ref 30.0–36.0)
MCV: 92.3 fL (ref 80.0–100.0)
Platelets: 307 10*3/uL (ref 150–400)
RBC: 4.05 MIL/uL (ref 3.87–5.11)
RDW: 14.6 % (ref 11.5–15.5)
WBC: 6.3 10*3/uL (ref 4.0–10.5)
nRBC: 0 % (ref 0.0–0.2)

## 2018-12-31 LAB — URINALYSIS, ROUTINE W REFLEX MICROSCOPIC
Bacteria, UA: NONE SEEN
Bilirubin Urine: NEGATIVE
Glucose, UA: NEGATIVE mg/dL
Ketones, ur: NEGATIVE mg/dL
Leukocytes,Ua: NEGATIVE
Nitrite: NEGATIVE
Protein, ur: NEGATIVE mg/dL
Specific Gravity, Urine: 1.023 (ref 1.005–1.030)
pH: 5 (ref 5.0–8.0)

## 2018-12-31 LAB — BASIC METABOLIC PANEL
Anion gap: 10 (ref 5–15)
BUN: 10 mg/dL (ref 6–20)
CO2: 23 mmol/L (ref 22–32)
Calcium: 10.1 mg/dL (ref 8.9–10.3)
Chloride: 108 mmol/L (ref 98–111)
Creatinine, Ser: 1.03 mg/dL — ABNORMAL HIGH (ref 0.44–1.00)
GFR calc Af Amer: >60 mL/min (ref >60)
GFR calc non Af Amer: >60 mL/min (ref >60)
Glucose, Bld: 114 mg/dL — ABNORMAL HIGH (ref 70–99)
Potassium: 3.7 mmol/L (ref 3.5–5.1)
Sodium: 141 mmol/L (ref 135–145)

## 2018-12-31 LAB — HEPATIC FUNCTION PANEL
ALT: 9 U/L (ref 0–44)
AST: 17 U/L (ref 15–41)
Albumin: 3.8 g/dL (ref 3.5–5.0)
Alkaline Phosphatase: 56 U/L (ref 38–126)
Bilirubin, Direct: 0.2 mg/dL (ref 0.0–0.2)
Indirect Bilirubin: 0.6 mg/dL (ref 0.3–0.9)
Total Bilirubin: 0.8 mg/dL (ref 0.3–1.2)
Total Protein: 9 g/dL — ABNORMAL HIGH (ref 6.5–8.1)

## 2018-12-31 LAB — TROPONIN I (HIGH SENSITIVITY): Troponin I (High Sensitivity): 5 ng/L (ref <18)

## 2018-12-31 LAB — BRAIN NATRIURETIC PEPTIDE: B Natriuretic Peptide: 10.3 pg/mL (ref 0.0–100.0)

## 2018-12-31 LAB — I-STAT BETA HCG BLOOD, ED (MC, WL, AP ONLY): I-stat hCG, quantitative: <5 m[IU]/mL (ref <5)

## 2018-12-31 LAB — CBG MONITORING, ED: Glucose-Capillary: 82 mg/dL (ref 70–99)

## 2018-12-31 LAB — LIPASE, BLOOD: Lipase: 29 U/L (ref 11–51)

## 2018-12-31 LAB — LACTIC ACID, PLASMA: Lactic Acid, Venous: 1 mmol/L (ref 0.5–1.9)

## 2018-12-31 MED ORDER — SODIUM CHLORIDE 0.9% FLUSH: 3.0000 mL | Freq: Once | INTRAVENOUS | Status: DC

## 2018-12-31 MED ORDER — CARVEDILOL 6.25 MG PO TABS
6.2500 mg | ORAL_TABLET | Freq: Two times a day (BID) | ORAL | Status: DC
  Administered 2019-01-01 – 2019-01-06 (×11): 6.25 mg via ORAL
  Filled 2018-12-31 (×11): qty 1

## 2018-12-31 MED ORDER — CLINDAMYCIN PHOSPHATE 600 MG/50ML IV SOLN
600.0000 mg | Freq: Once | INTRAVENOUS | Status: AC
  Administered 2018-12-31: 600 mg via INTRAVENOUS
  Filled 2018-12-31: qty 50

## 2018-12-31 MED ORDER — IOHEXOL 350 MG/ML SOLN
100.0000 mL | Freq: Once | INTRAVENOUS | Status: AC | PRN
  Administered 2018-12-31: 100 mL via INTRAVENOUS

## 2018-12-31 MED ORDER — CARVEDILOL 6.25 MG PO TABS: 6.2500 mg | ORAL_TABLET | Freq: Two times a day (BID) | ORAL | 1 refills | Status: DC

## 2018-12-31 MED ORDER — METOPROLOL TARTRATE 5 MG/5ML IV SOLN: 2.5000 mg | Freq: Four times a day (QID) | INTRAVENOUS | Status: DC | PRN

## 2018-12-31 MED ORDER — APIXABAN 5 MG PO TABS
5.0000 mg | ORAL_TABLET | Freq: Once | ORAL | Status: AC
  Administered 2018-12-31: 5 mg via ORAL
  Filled 2018-12-31: qty 1

## 2018-12-31 NOTE — ED Notes (Signed)
Patient transported to CT 

## 2018-12-31 NOTE — ED Provider Notes (Addendum)
[facility] EMERGENCY DEPARTMENT Provider Note   CSN: [id number] Arrival date & time: 12/31/18  1145     History   Chief Complaint Chief Complaint  Patient presents with   Dizziness    HPI [name] is a 53 y.o. female.     The history is provided by the patient and medical records. No language interpreter was used.  Chest Pain Pain location:  L chest and substernal area Pain quality: crushing and pressure   Pain radiates to:  Does not radiate Pain severity:  Moderate Onset quality:  Gradual Duration:  1 week Timing:  Intermittent Progression:  Waxing and waning Chronicity:  Recurrent Context: breathing   Relieved by:  Nothing Worsened by:  Exertion, deep breathing and coughing Ineffective treatments:  None tried Associated symptoms: dizziness (and lightheaded) and shortness of breath   Associated symptoms: no abdominal pain, no altered mental status, no anxiety, no back pain, no cough, no diaphoresis, no fatigue, no fever, no headache, no lower extremity edema, no nausea, no near-syncope, no numbness, no palpitations, no syncope, no vomiting and no weakness   Risk factors: prior DVT/PE   Risk factors: not female     Past Medical History:  Diagnosis Date   Anemia    Anxiety    Asthma    hx as child - no prob as adult - no inhaler   Blood transfusion 01/22/11   transfusion 2 units at [facility]   Depression 08/2010   psych assessment   Dyspnea    Fibroid    Headache(784.0)    rx for imitrex - last one jan   Keloid    Pulmonary embolism [facility])     Patient Active Problem List   Diagnosis Date Noted   Physical deconditioning 08/27/2018   Adenopathy 09/15/2017   Family history of colon cancer 08/01/2017   Taking multiple medications for chronic disease 08/01/2017   Arthritis    PE (pulmonary thromboembolism) ([place]) 01/26/2017   Axillary lymphadenopathy 01/26/2017   Urinary incontinence 01/25/2017   Hypokalemia 01/25/2017    Lobar pneumonia ([place]) 01/25/2017   Pressure ulcer of sacral region, stage 2 ([place]) 01/25/2017   Abnormal ECG 01/29/2013   Dyspnea    Menorrhagia with regular cycle 02/22/2011   Anemia 02/22/2011    Past Surgical History:  Procedure Laterality Date   ABDOMINAL HYSTERECTOMY     COLONOSCOPY N/A 11/08/2017   Procedure: COLONOSCOPY;  Surgeon: Danie Binder, MD;  Location: [facility];  Service: Endoscopy;  Laterality: N/A;  2:00pm   FLEXIBLE SIGMOIDOSCOPY N/A 09/17/2017   Procedure: FLEXIBLE SIGMOIDOSCOPY;  Surgeon: Danie Binder, MD;  Location: [facility] END[facility];  Service: Endoscopy;  Laterality: N/A;   HERNIA REPAIR  [id number]   umbilical   POLYPECTOMY  11/08/2017   Procedure: POLYPECTOMY;  Surgeon: Danie Binder, MD;  Location: [facility]ITE;  Service: Endoscopy;;  ascending colon (CSx1), transverse colon (CS x1), splenic flexure (HSx1)   SVD     Spontaneous vaginal delivery; x 1     OB History    Gravida  3   Para  1   Term  1   Preterm      AB  2   Living  1     SAB  1   TAB  1   Ectopic      Multiple      Live Births               Home Medications  Prior to Admission medications   Medication Sig Start Date End Date Taking? Authorizing Provider  acetaminophen (TYLENOL) 500 MG tablet Take 1,500 mg by mouth every 6 (six) hours as needed for moderate pain.    [provider]  amLODipine (NORVASC) 5 MG tablet Take 1 tablet (5 mg total) by mouth daily. 03/06/18   Daleen Bo, MD  apixaban (ELIQUIS) 5 MG TABS tablet Take 1 tablet (5 mg total) by mouth 2 (two) times daily. 12/18/18   Truitt Merle, MD  DULoxetine (CYMBALTA) 20 MG capsule TAKE 1 CAPSULE (20 MG TOTAL) BY MOUTH DAILY. 07/15/18   Truitt Merle, MD  furosemide (LASIX) 20 MG tablet Take 1 tablet (20 mg total) by mouth daily. Patient not taking: Reported on 08/27/2018 03/09/17   Brayton Caves, PA-C  mirabegron ER (MYRBETRIQ) 25 MG TB24 tablet Take 25 mg by mouth daily.    [provider]  Vitamins A & D (VITAMIN A & D) ointment Apply 1 application topically daily as needed for dry skin.    [provider]    Family History Family History  Problem Relation Age of Onset   Heart disease Mother    Hypertension Mother    Hypertension Father    Colon cancer Father 22       Passed away 76 yrs old   Hypertension Sister    Cancer Maternal Grandmother        gastric cancer   Cancer Maternal Grandfather        pancreatic cancer   Colon cancer Paternal Grandmother    Colon cancer Paternal Uncle    Colon polyps Neg Hx     Social History Social History   Tobacco Use   Smoking status: Never Smoker   Smokeless tobacco: Never Used  Substance Use Topics   Alcohol use: No   Drug use: No     Allergies   Bee venom, Augmentin [amoxicillin-pot clavulanate], Latex, and Nulytely [peg 3350-kcl-na bicarb-nacl]   Review of Systems Review of Systems  Constitutional: Negative for chills, diaphoresis, fatigue and fever.  HENT: Negative for congestion.   Respiratory: Positive for shortness of breath. Negative for cough, chest tightness, wheezing and stridor.   Cardiovascular: Positive for chest pain. Negative for palpitations, leg swelling, syncope and near-syncope.  Gastrointestinal: Negative for abdominal pain, constipation, diarrhea, nausea and vomiting.  Genitourinary: Negative for flank pain.  Musculoskeletal: Negative for back pain, neck pain and neck stiffness.  Neurological: Positive for dizziness (and lightheaded) and light-headedness. Negative for syncope, speech difficulty, weakness, numbness and headaches.  All other systems reviewed and are negative.    Physical Exam Updated Vital Signs BP (!) 173/111 (BP Location: Right Arm)    Temp 98 F (36.7 C) (Oral)    Resp 17    Ht 6' (1.829 m)    LMP 01/31/2011    SpO2 100%    BMI 40.81 kg/m   Physical Exam Vitals signs and nursing note reviewed.  Constitutional:      General:  She is not in acute distress.    Appearance: She is obese. She is not ill-appearing, toxic-appearing or diaphoretic.  HENT:     Head: Normocephalic.     Nose: Nose normal. No congestion.     Mouth/Throat:     Mouth: Mucous membranes are moist.     Pharynx: No oropharyngeal exudate or posterior oropharyngeal erythema.  Eyes:     Extraocular Movements: Extraocular movements intact.     Conjunctiva/sclera: Conjunctivae normal.  Pupils: Pupils are equal, round, and reactive to light.  Neck:     Musculoskeletal: No muscular tenderness.  Cardiovascular:     Rate and Rhythm: Regular rhythm. Tachycardia present.     Pulses: Normal pulses.     Heart sounds: No murmur.  Pulmonary:     Effort: Pulmonary effort is normal.     Breath sounds: Normal breath sounds. No wheezing, rhonchi or rales.  Chest:     Chest wall: No tenderness.  Abdominal:     General: Abdomen is flat.     Tenderness: There is no abdominal tenderness. There is no right CVA tenderness or left CVA tenderness.  Musculoskeletal:        General: No tenderness or signs of injury.     Right lower leg: Edema present.     Left lower leg: Edema present.  Skin:    Capillary Refill: Capillary refill takes less than 2 seconds.     Findings: No erythema.  Neurological:     General: No focal deficit present.     Mental Status: She is alert and oriented to person, place, and time.     Cranial Nerves: No cranial nerve deficit.     Sensory: No sensory deficit.     Motor: No weakness.     Coordination: Coordination normal.  Psychiatric:        Mood and Affect: Mood normal.      ED Treatments / Results  Labs (all labs ordered are listed, but only abnormal results are displayed) Labs Reviewed  BASIC METABOLIC PANEL - Abnormal; Notable for the following components:      Result Value   Glucose, Bld 114 (*)    Creatinine, Ser 1.03 (*)    All other components within normal limits  CBC - Abnormal; Notable for the following  components:   Hemoglobin 11.2 (*)    MCHC 29.9 (*)    All other components within normal limits  URINALYSIS, ROUTINE W REFLEX MICROSCOPIC - Abnormal; Notable for the following components:   Hgb urine dipstick SMALL (*)    All other components within normal limits  HEPATIC FUNCTION PANEL - Abnormal; Notable for the following components:   Total Protein 9.0 (*)    All other components within normal limits  URINE CULTURE  BRAIN NATRIURETIC PEPTIDE  LIPASE, BLOOD  CBG MONITORING, ED  I-STAT BETA HCG BLOOD, ED ([facility], [facility], [facility] ONLY)  TROPONIN I (HIGH SENSITIVITY)  TROPONIN I (HIGH SENSITIVITY)    EKG None  Radiology Vas [place] Lower Extremity Venous (dvt) (only [facility])  Result Date: 12/31/2018  Lower Venous Study Indications: Pain, Swelling, and Recent history of DVT.  Limitations: Body habitus, poor ultrasound/tissue interface and depth of vessels. Comparison Study: 12/15/2017- right lower extremity DVT involving the right                   posterior tibial and peroneal veins. Performing Technologist: Maudry Mayhew MHA, RDMS, RVT, RDCS  Examination Guidelines: A complete evaluation includes B-mode imaging, spectral Doppler, color Doppler, and power Doppler as needed of all accessible portions of each vessel. Bilateral testing is considered an integral part of a complete examination. Limited examinations for reoccurring indications may be performed as noted.  +---------+---------------+---------+-----------+----------+--------------+  RIGHT     Compressibility Phasicity Spontaneity Properties Thrombus Aging  +---------+---------------+---------+-----------+----------+--------------+  CFV       Full            Yes       Yes                                    +---------+---------------+---------+-----------+----------+--------------+  SFJ       Full                                                             +---------+---------------+---------+-----------+----------+--------------+  FV Prox   Full                                                              +---------+---------------+---------+-----------+----------+--------------+  FV Mid    Full                                                             +---------+---------------+---------+-----------+----------+--------------+  FV Distal Full                                                             +---------+---------------+---------+-----------+----------+--------------+  PFV       Full                                                             +---------+---------------+---------+-----------+----------+--------------+  POP       Full            Yes       Yes                                    +---------+---------------+---------+-----------+----------+--------------+  PTV       Full                                                             +---------+---------------+---------+-----------+----------+--------------+  +---------+---------------+---------+-----------+----------+--------------+  LEFT      Compressibility Phasicity Spontaneity Properties Thrombus Aging  +---------+---------------+---------+-----------+----------+--------------+  CFV       Full            Yes       Yes                                    +---------+---------------+---------+-----------+----------+--------------+  SFJ       Full                                                             +---------+---------------+---------+-----------+----------+--------------+  FV Prox   Full                                                             +---------+---------------+---------+-----------+----------+--------------+  FV Mid    Full                                                             +---------+---------------+---------+-----------+----------+--------------+  FV Distal Full                                                             +---------+---------------+---------+-----------+----------+--------------+  PFV       Full                                                              +---------+---------------+---------+-----------+----------+--------------+  POP       Full            Yes       Yes                                    +---------+---------------+---------+-----------+----------+--------------+  PTV       Full                                                             +---------+---------------+---------+-----------+----------+--------------+  Summary: Right: There is no evidence of deep vein thrombosis in the lower extremity. However, portions of this examination were limited- see technologist comments above. No cystic structure found in the popliteal fossa. Ultrasound characteristics of enlarged lymph nodes are noted in the groin. Left: There is no evidence of deep vein thrombosis in the lower extremity. However, portions of this examination were limited- see technologist comments above. No cystic structure found in the popliteal fossa. Ultrasound characteristics of enlarged lymph  nodes noted in the groin.  Unable to visualize bilateral peroneal veins due to technical limitations. *See table(s) above for measurements and observations.    Preliminary     Procedures Procedures (including critical care time)  Medications Ordered in ED Medications  sodium chloride flush (NS) 0.9 % injection 3 mL (has no administration in time range)  iohexol (OMNIPAQUE) 350 MG/ML injection 100 mL (100 mLs Intravenous Contrast Given 12/31/18 1525)     Initial Impression / Assessment and Plan / ED Course  I have reviewed the triage vital signs and the nursing notes.  Pertinent labs & imaging results that were available during my care of the  patient were reviewed by me and considered in my medical decision making (see chart for details).  Clinical Course as of Dec 31 737  Tue Dec 31, 2018  2227 Consult: Dr. Criss Alvine for [facility] hospitalist admit   [MP]    Clinical Course User Index [MP] Charlesetta Shanks, MD       [name] is a 53 y.o. female with past medical  history significant for DVT/PE on Xarelto therapy aside from missing 5 days a week and a half ago, prior pneumonia, anxiety, asthma, and anemia who presents with chest pain, shortness of breath, left leg pain, nausea, vomiting, abdominal discomfort, lightheadedness, and dizziness.  Patient reports that she had a problem getting her medication a week and a half ago and did not take her Xarelto for 5 days consecutively.  She reports that during that 5 days, she developed new pain in her left leg as well as some chest discomfort.  She reports the symptoms are consistent with prior DVT and PE.  She reports that she did not get evaluated within the last week but wanted to be seen.  She reports no fevers or chills, congestion, or cough.  She does report the chest pain feels like someone is sitting on her chest and is in the central left chest.  She reports it feels the same as her prior PE.  She reports nausea and vomiting several episodes.  She reports no constipation or diarrhea.  She reports abdominal cramping.  She reports no numbness, tingling, or weakness of extremities but she does report some lightheadedness and dizziness.  She reports that has resolved currently.  She denies a history of stroke or TIA.  On exam, patient has edematous legs bilaterally.  Patient's lungs have some crackles in the bases but were otherwise clear.  No wheezing.  Chest and abdomen are nontender on my exam.  Good pulses in extremities.  Patient was tachycardic but was not hypotensive or hypoxic.  EKG shows no STEMI.  Clinically I suspect patient may have DVT and/or PE with the accidental medication holiday.  Patient will have ultrasound as well as PE study.  She will have other screening labs given her history of pneumonia and her symptoms.  With her unremarkable neurologic exam with normal sensation, strength, and coordination on my exam, I have a low suspicion that the patient's dizziness was cerebral.  I do not suspect patient  had a paradoxical stroke from DVT going through a PFO.  Patient will have work-up and anticipate reassessment after work-up.      Care transferred to Dr. Vallery Ridge while awaiting results of CT PE study and reassessment.  Anticipate discharge if she does not have evidence of PE, tachycardia improves, and patient is feeling better.   Final Clinical Impressions(s) / ED Diagnoses   Final diagnoses:  Chest pain, unspecified type  Shortness of breath  Tachycardia  Lightheadedness  Pain of left lower extremity    Clinical Impression: 1. Chest pain, unspecified type   2. Shortness of breath   3. Tachycardia   4. Lightheadedness   5. Pain of left lower extremity     Disposition: Care transferred to Dr. Vallery Ridge while awaiting results of CT PE study and reassessment.  Anticipate discharge if she does not have evidence of PE, tachycardia improves, and patient is feeling better.  This note was prepared with assistance of Systems analyst. Occasional wrong-word or sound-a-like substitutions may have occurred due to the inherent limitations of voice recognition software.  Loys Shugars, Gwenyth Allegra, MD 12/31/18 1556    Veta Dambrosia, Gwenyth Allegra, MD 01/01/19 [phone]

## 2018-12-31 NOTE — ED Notes (Signed)
I tried to get a 20g in pt's right [facility] for CT angio w/o success. Put in consult for IV team.

## 2018-12-31 NOTE — Discharge Instructions (Signed)
1.  You had a CT scan of your chest done and ultrasound of your legs.  These studies had some technical difficulties but did not identify any large or central clots present.  You have been taking your Eliquis as prescribed for the past week, continue to do so. 2.  Your blood pressure has been elevated throughout your stay in the emergency department.  You have had other presentations upon which her blood pressure has been elevated.  At this time you are being started on a medication called carvedilol.  Take this twice daily and measure your blood pressures at home.  If you are having low blood pressures and feeling dizzy or weak, discontinue the medication. 3.  Because you had chest pain previously and also high blood pressure, it is very important you follow-up with your doctor this week for continuing monitoring of these conditions.  You will need your blood pressures rechecked and medication adjustments as needed.  Also, you should see a cardiologist for recheck. 4.  If you have return of your chest pain, shortness of breath or any worsening concerning symptoms, return to the emergency department.

## 2018-12-31 NOTE — Progress Notes (Signed)
Bilateral lower extremity venous duplex completed. Refer to "CV Proc" under chart review to view preliminary results.  12/31/2018 1:59 PM Kelby Aline., MHA, RVT, RDCS, RDMS

## 2018-12-31 NOTE — ED Triage Notes (Signed)
Pt presents to ED via [facility], c/o dizziness x2 weeks, missed 5 days of her elaquis due to unable to get it filled. Pt reports dizziness in the past when missing her elaquis. Hx of DVT pt is worried about another blood clot.

## 2018-12-31 NOTE — ED Provider Notes (Signed)
Patient care assumed from Dr. Sherry Ruffing.  CT scan had been ordered to evaluate for any recurrent PE.  Unfortunately, scan had to be repeated due to inadequate contrast bolusing on first scan.  Patient reports that she came in for concern of possible blood clot in her legs.  She pointed out 2 areas on the left lower extremity that have been very tender and red.  She reports she had chest pain earlier in the week.  She reports is a quite severe aching central pressure quality.  She reports she had it for several days and then it resolved 2 days ago.  She reports it has not been back since day before yesterday.  She denies she feels more short of breath than baseline but has severely limited mobility due to severe peripheral edema.  She has not had fever or productive cough.  Patient had temporarily been off of her Eliquis due to insurance issues but has been back on it now consistently for the past week. Physical Exam  BP (!) 160/85   Pulse (!) 108   Temp 97.8 F (36.6 C) (Rectal)   Resp 12   Ht 6' (1.829 m)   LMP 01/31/2011   SpO2 99%   BMI 40.81 kg/m   Physical Exam Constitutional:      Comments: Patient is alert and appropriate.  No respiratory distress at rest.  Cardiovascular:     Comments: Tachycardia.  No appreciable rub murmur gallop. Pulmonary:     Comments: Lungs grossly clear cannot appreciate crackles or wheeze. Musculoskeletal:     Comments: Severe lymphedema bilateral lower extremities.  This is chronic in appearance with severely thickened skin.  She has areas of tenderness on the medial left upper calf and just superior to the popliteal fossa.  These are erythematous and warm.  There is a more diffuse warm erythema above the more chronic lymphedema segment of the lower leg.  Skin:    General: Skin is warm and dry.  Neurological:     General: No focal deficit present.     Mental Status: She is oriented to person, place, and time.     Coordination: Coordination normal.   Psychiatric:        Mood and Affect: Mood normal.     ED Course/Procedures     Procedures  MDM  No central PE identified.  Unfortunately study was suboptimal.  Patient is currently on Xarelto.  She does have persistent tachycardia.  I have given a dose of carvedilol.  He also has some moderate hypertension.  This has been documented several times previously.  He denies that she is chronically on antihypertensives and reports her blood pressure is normally normotensive.  The areas of pain in her leg are erythematous.  Although suboptimal, DVT study did not identify significant DVT.  This may represent cellulitis with chronic lymphedema.  Patient is given a dose of clindamycin.  She has severe mobility issues due to obesity and severe lymphedema.  We will plan to admit for cellulitis with severe lymphedema.  Patient also had chest pain which is now resolved.  Troponins are normal.  She may also need repeat echocardiogram (last one done approximately 2 years ago).  The CT angiogram shows small pericardial effusion which could be the cause of the patient's pain which she is not currently endorsing.  Patient may ultimately need rehab placement for deconditioning.       Charlesetta Shanks, MD 12/31/18 2209

## 2018-12-31 NOTE — ED Notes (Signed)
Sister called- asked if the pt would call the house phone (wasn't given a number)

## 2018-12-31 NOTE — ED Notes (Signed)
Pt has 4+ pitting edema of LE bilat.

## 2019-01-01 ENCOUNTER — Inpatient Hospital Stay (HOSPITAL_COMMUNITY): Payer: Medicaid Other

## 2019-01-01 DIAGNOSIS — R5381 Other malaise: Secondary | ICD-10-CM

## 2019-01-01 DIAGNOSIS — Z86711 Personal history of pulmonary embolism: Secondary | ICD-10-CM

## 2019-01-01 DIAGNOSIS — I209 Angina pectoris, unspecified: Secondary | ICD-10-CM

## 2019-01-01 DIAGNOSIS — R42 Dizziness and giddiness: Secondary | ICD-10-CM

## 2019-01-01 DIAGNOSIS — L03115 Cellulitis of right lower limb: Secondary | ICD-10-CM

## 2019-01-01 DIAGNOSIS — R9431 Abnormal electrocardiogram [ECG] [EKG]: Secondary | ICD-10-CM

## 2019-01-01 DIAGNOSIS — R079 Chest pain, unspecified: Secondary | ICD-10-CM

## 2019-01-01 DIAGNOSIS — R0602 Shortness of breath: Secondary | ICD-10-CM

## 2019-01-01 DIAGNOSIS — L03116 Cellulitis of left lower limb: Principal | ICD-10-CM

## 2019-01-01 DIAGNOSIS — R000 Tachycardia, unspecified: Secondary | ICD-10-CM

## 2019-01-01 LAB — BASIC METABOLIC PANEL
Anion gap: 7 (ref 5–15)
BUN: 5 mg/dL — ABNORMAL LOW (ref 6–20)
CO2: 25 mmol/L (ref 22–32)
Calcium: 9 mg/dL (ref 8.9–10.3)
Chloride: 107 mmol/L (ref 98–111)
Creatinine, Ser: 0.69 mg/dL (ref 0.44–1.00)
GFR calc Af Amer: >60 mL/min (ref >60)
GFR calc non Af Amer: >60 mL/min (ref >60)
Glucose, Bld: 107 mg/dL — ABNORMAL HIGH (ref 70–99)
Potassium: 3.4 mmol/L — ABNORMAL LOW (ref 3.5–5.1)
Sodium: 139 mmol/L (ref 135–145)

## 2019-01-01 LAB — TROPONIN I (HIGH SENSITIVITY)
Troponin I (High Sensitivity): 7 ng/L (ref <18)
Troponin I (High Sensitivity): 8 ng/L (ref <18)

## 2019-01-01 LAB — CBC
HCT: 32.3 % — ABNORMAL LOW (ref 36.0–46.0)
Hemoglobin: 9.8 g/dL — ABNORMAL LOW (ref 12.0–15.0)
MCH: 27.5 pg (ref 26.0–34.0)
MCHC: 30.3 g/dL (ref 30.0–36.0)
MCV: 90.7 fL (ref 80.0–100.0)
Platelets: 275 10*3/uL (ref 150–400)
RBC: 3.56 MIL/uL — ABNORMAL LOW (ref 3.87–5.11)
RDW: 14.5 % (ref 11.5–15.5)
WBC: 7.2 10*3/uL (ref 4.0–10.5)
nRBC: 0 % (ref 0.0–0.2)

## 2019-01-01 LAB — SARS CORONAVIRUS 2 (TAT 6-24 HRS): SARS Coronavirus 2: NEGATIVE

## 2019-01-01 LAB — ECHOCARDIOGRAM LIMITED: Height: 72 in

## 2019-01-01 LAB — URINE CULTURE

## 2019-01-01 MED ORDER — AMLODIPINE BESYLATE 5 MG PO TABS
5.0000 mg | ORAL_TABLET | Freq: Every day | ORAL | Status: DC
  Administered 2019-01-01 – 2019-01-06 (×6): 5 mg via ORAL
  Filled 2019-01-01 (×6): qty 1

## 2019-01-01 MED ORDER — ACETAMINOPHEN 325 MG PO TABS
650.0000 mg | ORAL_TABLET | Freq: Four times a day (QID) | ORAL | Status: DC | PRN
  Administered 2019-01-03 – 2019-01-05 (×2): 650 mg via ORAL
  Filled 2019-01-01 (×3): qty 2

## 2019-01-01 MED ORDER — OXYCODONE HCL 5 MG PO TABS
5.0000 mg | ORAL_TABLET | ORAL | Status: DC | PRN
  Administered 2019-01-01 – 2019-01-03 (×3): 5 mg via ORAL
  Filled 2019-01-01 (×3): qty 1

## 2019-01-01 MED ORDER — SODIUM CHLORIDE 0.9% FLUSH
3.0000 mL | INTRAVENOUS | Status: DC | PRN
  Administered 2019-01-02 – 2019-01-05 (×2): 3 mL via INTRAVENOUS
  Filled 2019-01-01 (×2): qty 3

## 2019-01-01 MED ORDER — SODIUM CHLORIDE 0.9 % IV SOLN
250.0000 mL | INTRAVENOUS | Status: DC | PRN
  Administered 2019-01-02 – 2019-01-03 (×2): 250 mL via INTRAVENOUS

## 2019-01-01 MED ORDER — APIXABAN 5 MG PO TABS
5.0000 mg | ORAL_TABLET | Freq: Two times a day (BID) | ORAL | Status: DC
  Administered 2019-01-01 – 2019-01-06 (×12): 5 mg via ORAL
  Filled 2019-01-01 (×14): qty 1

## 2019-01-01 MED ORDER — CLINDAMYCIN PHOSPHATE 600 MG/50ML IV SOLN
600.0000 mg | Freq: Three times a day (TID) | INTRAVENOUS | Status: DC
  Administered 2019-01-01 – 2019-01-04 (×10): 600 mg via INTRAVENOUS
  Filled 2019-01-01 (×11): qty 50

## 2019-01-01 MED ORDER — SENNA 8.6 MG PO TABS
1.0000 | ORAL_TABLET | Freq: Two times a day (BID) | ORAL | Status: DC
  Administered 2019-01-01 – 2019-01-02 (×4): 8.6 mg via ORAL
  Filled 2019-01-01 (×4): qty 1

## 2019-01-01 MED ORDER — VITAMINS A & D EX OINT
1.0000 "application " | TOPICAL_OINTMENT | Freq: Every day | CUTANEOUS | Status: DC | PRN
  Administered 2019-01-02: 1 via TOPICAL
  Filled 2019-01-01 (×2): qty 56.7

## 2019-01-01 MED ORDER — SODIUM CHLORIDE 0.9% FLUSH
3.0000 mL | Freq: Two times a day (BID) | INTRAVENOUS | Status: DC
  Administered 2019-01-01 – 2019-01-06 (×9): 3 mL via INTRAVENOUS

## 2019-01-01 MED ORDER — ACETAMINOPHEN 650 MG RE SUPP: 650.0000 mg | Freq: Four times a day (QID) | RECTAL | Status: DC | PRN

## 2019-01-01 MED ORDER — SODIUM CHLORIDE 0.9 % IV SOLN
INTRAVENOUS | Status: DC
  Administered 2019-01-01 (×2): via INTRAVENOUS

## 2019-01-01 MED FILL — CARVEDILOL 6.25 MG TABLET: 6.25 | 15 days supply | Qty: 30 | Fill #0

## 2019-01-01 NOTE — ED Notes (Signed)
Lunch Tray Ordered @ 1020.  

## 2019-01-01 NOTE — ED Notes (Signed)
Echo here

## 2019-01-01 NOTE — H&P (Signed)
History and Physical    [name] [id number] DOB: 1965/06/03 DOA: 12/31/2018  PCP: Gildardo Pounds, NP    Patient coming from: Home    Chief Complaint: Shortness of breath chest pain leg pain  HPI: [name] is a 53 y.o. female with medical history significant of pulmonary embolus x2 DVT morbidly obese chronic anemia, depression, elephantiasis-bilateral lymphatic leg edema came with a chief complaint of subsided in the emergency more right side, associated with shortness of breath,.  Patient has some pain in her right leg associated with area of erythema and increased temperature. In the emergency room was a suspicion of patient having a PE event she was anticoagulated on Eliquis. The CT angio of the chest did not show any central pulmonary embolus. The bilateral leg ultrasound was negative for DVT The chest pain subsided in the emergency room Troponin was negative Electrocardiogram sinus tachycardia with interventricular conduction delay and left anterior hemiblock poor R wave progression lateral leads rule out ischemia rule out lead placement CT angio of the chest with small pericardial effusion Patient started on clindamycin for right leg cellulitis   ED Course:  Patient improved CT angio no central pulmonary embolus Venous Doppler right and left leg negative Clindamycin for right leg cellulitis  Review of Systems: As per HPI otherwise 10 point review of systems negative  No fever No change in mental status Positive for shortness of breath Positive for chest pain and tachycardia No abdominal pain Severe bilateral leg edema, with increased temperature erythema right leg Musculoskeletal no joint pain No dysuria polyuria No focal neurologic deficits no change in mental status Psych mood depressed.   Past Medical History:  Diagnosis Date   Anemia    Anxiety    Asthma    hx as child - no prob as adult - no inhaler   Blood transfusion 01/22/11    transfusion 2 units at [facility]   Depression 08/2010   psych assessment   Dyspnea    Fibroid    Headache(784.0)    rx for imitrex - last one jan   Keloid    Pulmonary embolism [facility])     Past Surgical History:  Procedure Laterality Date   ABDOMINAL HYSTERECTOMY     COLONOSCOPY N/A 11/08/2017   Procedure: COLONOSCOPY;  Surgeon: Danie Binder, MD;  Location: [facility];  Service: Endoscopy;  Laterality: N/A;  2:00pm   FLEXIBLE SIGMOIDOSCOPY N/A 09/17/2017   Procedure: FLEXIBLE SIGMOIDOSCOPY;  Surgeon: Danie Binder, MD;  Location: [facility];  Service: Endoscopy;  Laterality: N/A;   HERNIA REPAIR  [id number]   umbilical   POLYPECTOMY  11/08/2017   Procedure: POLYPECTOMY;  Surgeon: Danie Binder, MD;  Location: [facility];  Service: Endoscopy;;  ascending colon (CSx1), transverse colon (CS x1), splenic flexure (HSx1)   SVD     Spontaneous vaginal delivery; x 1     reports that she has never smoked. She has never used smokeless tobacco. She reports that she does not drink alcohol or use drugs.  Allergies  Allergen Reactions   Bee Venom Anaphylaxis   Augmentin [Amoxicillin-Pot Clavulanate] Hives, Itching and Other (See Comments)    Did PCN reaction causing immediate rash, facial/tongue/throat swelling, SOB or lightheadedness with hypotension: yes Did PCN reaction causing severe rash involving mucus membranes or skin necrosis: no Has patient had a PCN reaction that required hospitalization: in hospital Has patient had a PCN reaction occurring within the last 10 years: no If all of  the above answers are "NO", then may proceed with Cephalosporin use.  Pt reports no recollection of any reactions when taking penicillin in past   Latex Hives and Other (See Comments)    Local reaction (welts). Patient denies any wheezing or other reaction with latex exposure   Nulytely [Peg 3350-Kcl-Na Bicarb-Nacl]     NAUSEA AND VOMITING. MAY TOLERATE LOW VOLUME PREP.    Family  History  Problem Relation Age of Onset   Heart disease Mother    Hypertension Mother    Hypertension Father    Colon cancer Father 66       Passed away 34 yrs old   Hypertension Sister    Cancer Maternal Grandmother        gastric cancer   Cancer Maternal Grandfather        pancreatic cancer   Colon cancer Paternal Grandmother    Colon cancer Paternal Uncle    Colon polyps Neg Hx      Prior to Admission medications   Medication Sig Start Date End Date Taking? Authorizing Provider  apixaban (ELIQUIS) 5 MG TABS tablet Take 1 tablet (5 mg total) by mouth 2 (two) times daily. 12/18/18  Yes Truitt Merle, MD  enoxaparin (LOVENOX) 150 MG/ML injection Inject 1 mL into the skin See admin instructions. Only when pt has a procedure. 08/02/18  Yes [provider]  Vitamins A & D (VITAMIN A & D) ointment Apply 1 application topically daily as needed for dry skin.   Yes [provider]  amLODipine (NORVASC) 5 MG tablet Take 1 tablet (5 mg total) by mouth daily. Patient not taking: Reported on 12/31/2018 03/06/18   Daleen Bo, MD  carvedilol (COREG) 6.25 MG tablet Take 1 tablet (6.25 mg total) by mouth 2 (two) times daily with a meal. 12/31/18   Charlesetta Shanks, MD  DULoxetine (CYMBALTA) 20 MG capsule TAKE 1 CAPSULE (20 MG TOTAL) BY MOUTH DAILY. Patient not taking: Reported on 12/31/2018 07/15/18   Truitt Merle, MD  furosemide (LASIX) 20 MG tablet Take 1 tablet (20 mg total) by mouth daily. Patient not taking: Reported on 08/27/2018 03/09/17   Brayton Caves, [place]    Physical Exam: Vitals:   12/31/18 2330 01/01/19 0000 01/01/19 0030 01/01/19 0100  BP: 139/68 137/62 (!) 148/65 (!) 145/66  Pulse:      Resp: 11 19 15 15   Temp:      TempSrc:      SpO2:      Height:        Constitutional: NAD, calm, comfortable Vitals:   12/31/18 2330 01/01/19 0000 01/01/19 0030 01/01/19 0100  BP: 139/68 137/62 (!) 148/65 (!) 145/66  Pulse:      Resp: 11 19 15 15   Temp:      TempSrc:       SpO2:      Height:       Eyes: PERRL, lids and conjunctivae normal ENMT: Mucous membranes are moist. Posterior pharynx clear of any exudate or lesions.Normal dentition.  Neck: normal, supple, no masses, no thyromegaly Respiratory: clear to auscultation bilaterally, no wheezing, no crackles. Normal respiratory effort. No accessory muscle use.  Cardiovascular: Regular rate and rhythm, no murmurs / rubs / gallops. ++++ extremity edema. 2+ pedal pulses. No carotid bruits.  Abdomen: no tenderness, no masses palpated. No hepatosplenomegaly. Bowel sounds positive.  Musculoskeletal: no clubbing / cyanosis. No joint deformity upper and lower extremities. Good ROM, no contractures. Normal muscle tone.  Skin: no rashes, lesions, ulcers. No  induration Neurologic: CN 2-12 grossly intact. Sensation intact, DTR normal. Strength 5/5 in all 4.  Psychiatric: Normal judgment and insight. Alert and oriented x 3. Normal mood.    Labs on Admission: I have personally reviewed following labs and imaging studies  CBC: Recent Labs  Lab 12/31/18 1152  WBC 6.3  HGB 11.2*  HCT 37.4  MCV 92.3  PLT [id number]   Basic Metabolic Panel: Recent Labs  Lab 12/31/18 1152  NA 141  K 3.7  CL 108  CO2 23  GLUCOSE 114*  BUN 10  CREATININE 1.03*  CALCIUM 10.1   GFR: CrCl cannot be calculated (Unknown ideal weight.). Liver Function Tests: Recent Labs  Lab 12/31/18 1339  AST 17  ALT 9  ALKPHOS 56  BILITOT 0.8  PROT 9.0*  ALBUMIN 3.8   Recent Labs  Lab 12/31/18 1339  LIPASE 29   No results for input(s): AMMONIA in the last 168 hours. Coagulation Profile: No results for input(s): INR, PROTIME in the last 168 hours. Cardiac Enzymes: No results for input(s): CKTOTAL, CKMB, CKMBINDEX, TROPONINI in the last 168 hours. BNP (last 3 results) No results for input(s): PROBNP in the last 8760 hours. HbA1C: No results for input(s): HGBA1C in the last 72 hours. CBG: Recent Labs  Lab 12/31/18 1236  GLUCAP 82    Lipid Profile: No results for input(s): CHOL, HDL, LDLCALC, TRIG, CHOLHDL, LDLDIRECT in the last 72 hours. Thyroid Function Tests: No results for input(s): TSH, T4TOTAL, FREET4, T3FREE, THYROIDAB in the last 72 hours. Anemia Panel: No results for input(s): VITAMINB12, FOLATE, FERRITIN, TIBC, IRON, RETICCTPCT in the last 72 hours. Urine analysis:    Component Value Date/Time   COLORURINE YELLOW 12/31/2018 1425   APPEARANCEUR CLEAR 12/31/2018 1425   LABSPEC 1.023 12/31/2018 1425   PHURINE 5.0 12/31/2018 1425   GLUCOSEU NEGATIVE 12/31/2018 1425   HGBUR SMALL (A) 12/31/2018 1425   BILIRUBINUR NEGATIVE 12/31/2018 1425   BILIRUBINUR small 03/09/2017 1230   KETONESUR NEGATIVE 12/31/2018 1425   PROTEINUR NEGATIVE 12/31/2018 1425   UROBILINOGEN 0.2 03/09/2017 1230   UROBILINOGEN 1.0 08/30/2010 1456   NITRITE NEGATIVE 12/31/2018 1425   LEUKOCYTESUR NEGATIVE 12/31/2018 1425    Radiological Exams on Admission: Ct Angio Chest Pe W And/or Wo Contrast  Result Date: 12/31/2018 CLINICAL DATA:  Pleuritic and exertional chest pain with shortness of breath EXAM: CT ANGIOGRAPHY CHEST WITH CONTRAST TECHNIQUE: Multidetector CT imaging of the chest was performed using the standard protocol during bolus administration of intravenous contrast. Multiplanar CT image reconstructions and MIPs were obtained to evaluate the vascular anatomy. CONTRAST:  140mL OMNIPAQUE IOHEXOL 350 MG/ML SOLN COMPARISON:  CT chest 05/06/2018 FINDINGS: Cardiovascular: The heart is normal in size. Small pericardial effusion. The aorta is normal in caliber. No dissection. No atherosclerotic calcifications. The branch vessels are patent. Very poor opacification of the pulmonary arteries make the study very limited for pulmonary embolism. This was the second attempt. The patient could not have any more contrast. I do not see any obvious large pulmonary emboli but patient may need a V/Q scan. Mediastinum/Nodes: No mediastinal or hilar mass  or adenopathy. The esophagus is grossly normal. Lungs/Pleura: No acute or significant pulmonary findings. No infiltrates or effusions. No pulmonary edema. No worrisome pulmonary lesions. Upper Abdomen: No significant upper abdominal findings.  A Musculoskeletal: No breast masses are identified. Persistent axillary and subpectoral lymphadenopathy. No significant bony findings. Review of the MIP images confirms the above findings. IMPRESSION: 1. Very limited study for pulmonary embolism due to poor  opacification of the pulmonary arteries despite a second try. No obvious large central pulmonary emboli but patient may need V/Q scan if PE remains clinical concern. 2. Normal thoracic aorta. 3. Small pericardial effusion. 4. No acute pulmonary findings. 5. Stable bilateral axillary and subpectoral lymphadenopathy. Electronically Signed   By: Marijo Sanes M.D.   On: 12/31/2018 18:34   Vas [place] Lower Extremity Venous (dvt) (only [facility])  Result Date: 12/31/2018  Lower Venous Study Indications: Pain, Swelling, and Recent history of DVT.  Limitations: Body habitus, poor ultrasound/tissue interface and depth of vessels. Comparison Study: 12/15/2017- right lower extremity DVT involving the right                   posterior tibial and peroneal veins. Performing Technologist: Maudry Mayhew MHA, RDMS, RVT, RDCS  Examination Guidelines: A complete evaluation includes B-mode imaging, spectral Doppler, color Doppler, and power Doppler as needed of all accessible portions of each vessel. Bilateral testing is considered an integral part of a complete examination. Limited examinations for reoccurring indications may be performed as noted.  +---------+---------------+---------+-----------+----------+--------------+  RIGHT     Compressibility Phasicity Spontaneity Properties Thrombus Aging  +---------+---------------+---------+-----------+----------+--------------+  CFV       Full            Yes       Yes                                     +---------+---------------+---------+-----------+----------+--------------+  SFJ       Full                                                             +---------+---------------+---------+-----------+----------+--------------+  FV Prox   Full                                                             +---------+---------------+---------+-----------+----------+--------------+  FV Mid    Full                                                             +---------+---------------+---------+-----------+----------+--------------+  FV Distal Full                                                             +---------+---------------+---------+-----------+----------+--------------+  PFV       Full                                                             +---------+---------------+---------+-----------+----------+--------------+  POP       Full            Yes       Yes                                    +---------+---------------+---------+-----------+----------+--------------+  PTV       Full                                                             +---------+---------------+---------+-----------+----------+--------------+  +---------+---------------+---------+-----------+----------+--------------+  LEFT      Compressibility Phasicity Spontaneity Properties Thrombus Aging  +---------+---------------+---------+-----------+----------+--------------+  CFV       Full            Yes       Yes                                    +---------+---------------+---------+-----------+----------+--------------+  SFJ       Full                                                             +---------+---------------+---------+-----------+----------+--------------+  FV Prox   Full                                                             +---------+---------------+---------+-----------+----------+--------------+  FV Mid    Full                                                              +---------+---------------+---------+-----------+----------+--------------+  FV Distal Full                                                             +---------+---------------+---------+-----------+----------+--------------+  PFV       Full                                                             +---------+---------------+---------+-----------+----------+--------------+  POP       Full            Yes       Yes                                    +---------+---------------+---------+-----------+----------+--------------+  PTV       Full                                                             +---------+---------------+---------+-----------+----------+--------------+  Summary: Right: There is no evidence of deep vein thrombosis in the lower extremity. However, portions of this examination were limited- see technologist comments above. No cystic structure found in the popliteal fossa. Ultrasound characteristics of enlarged lymph nodes are noted in the groin. Left: There is no evidence of deep vein thrombosis in the lower extremity. However, portions of this examination were limited- see technologist comments above. No cystic structure found in the popliteal fossa. Ultrasound characteristics of enlarged lymph  nodes noted in the groin.  Unable to visualize bilateral peroneal veins due to technical limitations. *See table(s) above for measurements and observations. Electronically signed by Harold Barban MD on 12/31/2018 at 5:39:59 PM.    Final     EKG: Independently reviewed.  Sinus tachycardia left anterior hemiblock interventricular conduction delay poor R wave progression lateral leads rule out ischemia probably lead placement  Cellulitis right leg Patient with history of elephantiasis, bilateral lymphatic edema Pain in the right leg with moderate erythema and increased temperature Ultrasound negative for DVT Patient with history of DVT and pulmonary embolus twice Patient with severe allergic  reaction to penicillin Plan patient was started on clindamycin in the emergency room, will continue with Clinda for now  Chest pain  associated with tachycardia and shortness of breath Patient came with a chief complaint of chest pain as well as shortness of breath Electrocardiogram interventricular conduction delay questionable electrocardiogram changes from previous. Troponin negative Patient is a atypical mole on the right side CT angio of the chest showed small pericardial effusion Plan serial troponin, echocardiogram, evaluate in the morning cardiology consult  History of DVT and pulmonary embolism Resume Eliquis Patient on as needed Lovenox before procedures  Morbidly obese BMI over 40 Encouraged to lose weight   Assessment/Plan Principal Problem:   Cellulitis of right leg Active Problems:   Abnormal ECG   Physical deconditioning   Elephantiasis   Ischemic chest pain (HCC)   History of pulmonary embolus (PE)      DVT prophylaxis: eliquis Code Status: full code Family Communication: yes husband  Disposition Plan:go home Consults called: NO Admission status:  full   [name] G Sarya Linenberger MD [facility]  If 7PM-7AM, please contact night-coverage [email or web address]   01/01/2019, 1:29 AM

## 2019-01-01 NOTE — Progress Notes (Signed)
  Echocardiogram 2D Echocardiogram has been performed.  Lauren Charleston 01/01/2019, 8:59 AM

## 2019-01-01 NOTE — Progress Notes (Signed)
Pt received to 3e28 from the ed via stretcher, pt as bedding was satureated, pt cleaned, gown changed and assisted to bed, pt very slow and unable to take but step to bed due to pain in left leg, pain given as ordered, bed weight done at this time due to pain, purewick placed per pt request, states used bsc at home but very painful in foot/leg rightnow, telemetry applied and CCMD called. Call ;light in reach

## 2019-01-01 NOTE — Progress Notes (Signed)
Progress Note    [name]  [id number] DOB: 1965-07-19  DOA: 12/31/2018 PCP: Lauren Pounds, NP    Brief Narrative:     Medical records reviewed and are as summarized below:  [name] is an 53 y.o. female with medical history significant of pulmonary embolus x2 DVT morbidly obese chronic anemia, depression, elephantiasis-bilateral lymphatic leg edema came with a chief complaint of subsided in the emergency more right side, associated with shortness of breath,.  Patient has some pain in her LEFT leg associated with area of erythema and increased temperature.   Assessment/Plan:   Principal Problem:   Cellulitis of right leg Active Problems:   Abnormal ECG   Physical deconditioning   Elephantiasis   Ischemic chest pain (HCC)   History of pulmonary embolus (PE)    Cellulitis LEFT LEG Patient with history of elephantiasis, bilateral lymph edema Pain in theleft leg with moderate erythema and increased temperature Ultrasound negative for DVT Patient with severe allergic reaction to penicillin -started on clindamycin in the emergency room, will continue with Clinda for now and monitor for improvement  Chest pain  -resolved -Troponin negative -echo: Left ventricular ejection fraction, by visual estimation, is 65 to 70%. The left ventricle has normal function. Left ventricular septal wall thickness was severely increased. Moderately increased left ventricular posterior wall thickness. There is no  left ventricular hypertrophy.  No pericardial effusion noted  History of DVT and pulmonary embolism -resume home eliquis (apparently missed 5 days at home) -duplex negative for DVT -CTA done but Very limited study for pulmonary embolism due to poor opacification of the pulmonary arteries despite a second try. No obvious large central pulmonary emboli but patient may need V/Q scan if PE remains clinical concern.  Morbidly obese Estimated body mass index is  40.81 kg/m as calculated from the following:   Height as of this encounter: 6' (1.829 m).   Weight as of 05/16/18: 136.5 kg.  Hypokalemia -replete   Medical Consultants:    None.    Subjective:   Left leg still tender to palpation Denies SOB and denies chest pain  Objective:    Vitals:   01/01/19 0530 01/01/19 0600 01/01/19 0630 01/01/19 0700  BP: 129/65 133/66 138/71 (!) 149/76  Pulse: (!) 108  (!) 105 (!) 108  Resp: 18 19 16 17   Temp:      TempSrc:      SpO2: 97%  98% 99%  Height:        Intake/Output Summary (Last 24 hours) at 01/01/2019 0953 Last data filed at 01/01/2019 0654 Gross per 24 hour  Intake 150 ml  Output --  Net 150 ml   There were no vitals filed for this visit.   Data Reviewed:   I have personally reviewed following labs and imaging studies:  Labs: Labs show the following:   Basic Metabolic Panel: Recent Labs  Lab 12/31/18 1152 01/01/19 0404  NA 141 139  K 3.7 3.4*  CL 108 107  CO2 23 25  GLUCOSE 114* 107*  BUN 10 5*  CREATININE 1.03* 0.69  CALCIUM 10.1 9.0   GFR CrCl cannot be calculated (Unknown ideal weight.). Liver Function Tests: Recent Labs  Lab 12/31/18 1339  AST 17  ALT 9  ALKPHOS 56  BILITOT 0.8  PROT 9.0*  ALBUMIN 3.8   Recent Labs  Lab 12/31/18 1339  LIPASE 29   No results for input(s): AMMONIA in the last 168 hours. Coagulation profile No results for  input(s): INR, PROTIME in the last 168 hours.  CBC: Recent Labs  Lab 12/31/18 1152 01/01/19 0404  WBC 6.3 7.2  HGB 11.2* 9.8*  HCT 37.4 32.3*  MCV 92.3 90.7  PLT 307 275   Cardiac Enzymes: No results for input(s): CKTOTAL, CKMB, CKMBINDEX, TROPONINI in the last 168 hours. BNP (last 3 results) No results for input(s): PROBNP in the last 8760 hours. CBG: Recent Labs  Lab 12/31/18 1236  GLUCAP 82   D-Dimer: No results for input(s): DDIMER in the last 72 hours. Hgb A1c: No results for input(s): HGBA1C in the last 72 hours. Lipid  Profile: No results for input(s): CHOL, HDL, LDLCALC, TRIG, CHOLHDL, LDLDIRECT in the last 72 hours. Thyroid function studies: No results for input(s): TSH, T4TOTAL, T3FREE, THYROIDAB in the last 72 hours.  Invalid input(s): FREET3 Anemia work up: No results for input(s): VITAMINB12, FOLATE, FERRITIN, TIBC, IRON, RETICCTPCT in the last 72 hours. Sepsis Labs: Recent Labs  Lab 12/31/18 1152 12/31/18 2031 01/01/19 0404  WBC 6.3  --  7.2  LATICACIDVEN  --  1.0  --     Microbiology Recent Results (from the past 240 hour(s))  SARS CORONAVIRUS 2 (TAT 6-24 HRS) Nasopharyngeal Nasopharyngeal Swab     Status: None   Collection Time: 12/31/18 11:24 PM   Specimen: Nasopharyngeal Swab  Result Value Ref Range Status   SARS Coronavirus 2 NEGATIVE NEGATIVE Final    Comment: (NOTE) SARS-CoV-2 target nucleic acids are NOT DETECTED. The SARS-CoV-2 RNA is generally detectable in upper and lower respiratory specimens during the acute phase of infection. Negative results do not preclude SARS-CoV-2 infection, do not rule out co-infections with other pathogens, and should not be used as the sole basis for treatment or other patient management decisions. Negative results must be combined with clinical observations, patient history, and epidemiological information. The expected result is Negative. Fact Sheet for Patients: SugarRoll.be Fact Sheet for Healthcare Providers: https://www.woods-mathews.com/ This test is not yet approved or cleared by the [place] FDA and  has been authorized for detection and/or diagnosis of SARS-CoV-2 by FDA under an Emergency Use Authorization (EUA). This EUA will remain  in effect (meaning this test can be used) for the duration of the COVID-19 declaration under Section 56 4(b)(1) of the Act, 21 U.S.C. section 360bbb-3(b)(1), unless the authorization is terminated or revoked sooner. Performed at [facility],  [place] [name], [place], [place] [address]     Procedures and diagnostic studies:  Ct Angio Chest Pe W And/or Wo Contrast  Result Date: 12/31/2018 CLINICAL DATA:  Pleuritic and exertional chest pain with shortness of breath EXAM: CT ANGIOGRAPHY CHEST WITH CONTRAST TECHNIQUE: Multidetector CT imaging of the chest was performed using the standard protocol during bolus administration of intravenous contrast. Multiplanar CT image reconstructions and MIPs were obtained to evaluate the vascular anatomy. CONTRAST:  147mL OMNIPAQUE IOHEXOL 350 MG/ML SOLN COMPARISON:  CT chest 05/06/2018 FINDINGS: Cardiovascular: The heart is normal in size. Small pericardial effusion. The aorta is normal in caliber. No dissection. No atherosclerotic calcifications. The branch vessels are patent. Very poor opacification of the pulmonary arteries make the study very limited for pulmonary embolism. This was the second attempt. The patient could not have any more contrast. I do not see any obvious large pulmonary emboli but patient may need a V/Q scan. Mediastinum/Nodes: No mediastinal or hilar mass or adenopathy. The esophagus is grossly normal. Lungs/Pleura: No acute or significant pulmonary findings. No infiltrates or effusions. No pulmonary edema. No worrisome  pulmonary lesions. Upper Abdomen: No significant upper abdominal findings.  A Musculoskeletal: No breast masses are identified. Persistent axillary and subpectoral lymphadenopathy. No significant bony findings. Review of the MIP images confirms the above findings. IMPRESSION: 1. Very limited study for pulmonary embolism due to poor opacification of the pulmonary arteries despite a second try. No obvious large central pulmonary emboli but patient may need V/Q scan if PE remains clinical concern. 2. Normal thoracic aorta. 3. Small pericardial effusion. 4. No acute pulmonary findings. 5. Stable bilateral axillary and subpectoral lymphadenopathy. Electronically Signed   By: Marijo Sanes M.D.   On: 12/31/2018 18:34   Vas [place] Lower Extremity Venous (dvt) (only [facility])  Result Date: 12/31/2018  Lower Venous Study Indications: Pain, Swelling, and Recent history of DVT.  Limitations: Body habitus, poor ultrasound/tissue interface and depth of vessels. Comparison Study: 12/15/2017- right lower extremity DVT involving the right                   posterior tibial and peroneal veins. Performing Technologist: Maudry Mayhew MHA, RDMS, RVT, RDCS  Examination Guidelines: A complete evaluation includes B-mode imaging, spectral Doppler, color Doppler, and power Doppler as needed of all accessible portions of each vessel. Bilateral testing is considered an integral part of a complete examination. Limited examinations for reoccurring indications may be performed as noted.  +---------+---------------+---------+-----------+----------+--------------+  RIGHT     Compressibility Phasicity Spontaneity Properties Thrombus Aging  +---------+---------------+---------+-----------+----------+--------------+  CFV       Full            Yes       Yes                                    +---------+---------------+---------+-----------+----------+--------------+  SFJ       Full                                                             +---------+---------------+---------+-----------+----------+--------------+  FV Prox   Full                                                             +---------+---------------+---------+-----------+----------+--------------+  FV Mid    Full                                                             +---------+---------------+---------+-----------+----------+--------------+  FV Distal Full                                                             +---------+---------------+---------+-----------+----------+--------------+  PFV       Full                                                             +---------+---------------+---------+-----------+----------+--------------+  POP        Full            Yes       Yes                                    +---------+---------------+---------+-----------+----------+--------------+  PTV       Full                                                             +---------+---------------+---------+-----------+----------+--------------+  +---------+---------------+---------+-----------+----------+--------------+  LEFT      Compressibility Phasicity Spontaneity Properties Thrombus Aging  +---------+---------------+---------+-----------+----------+--------------+  CFV       Full            Yes       Yes                                    +---------+---------------+---------+-----------+----------+--------------+  SFJ       Full                                                             +---------+---------------+---------+-----------+----------+--------------+  FV Prox   Full                                                             +---------+---------------+---------+-----------+----------+--------------+  FV Mid    Full                                                             +---------+---------------+---------+-----------+----------+--------------+  FV Distal Full                                                             +---------+---------------+---------+-----------+----------+--------------+  PFV       Full                                                             +---------+---------------+---------+-----------+----------+--------------+  POP       Full            Yes       Yes                                    +---------+---------------+---------+-----------+----------+--------------+  PTV       Full                                                             +---------+---------------+---------+-----------+----------+--------------+  Summary: Right: There is no evidence of deep vein thrombosis in the lower extremity. However, portions of this examination were limited- see technologist comments above. No cystic structure found in the  popliteal fossa. Ultrasound characteristics of enlarged lymph nodes are noted in the groin. Left: There is no evidence of deep vein thrombosis in the lower extremity. However, portions of this examination were limited- see technologist comments above. No cystic structure found in the popliteal fossa. Ultrasound characteristics of enlarged lymph  nodes noted in the groin.  Unable to visualize bilateral peroneal veins due to technical limitations. *See table(s) above for measurements and observations. Electronically signed by Harold Barban MD on 12/31/2018 at 5:39:59 PM.    Final     Medications:    amLODipine  5 mg Oral Daily   apixaban  5 mg Oral BID   carvedilol  6.25 mg Oral BID WC   senna  1 tablet Oral BID   sodium chloride flush  3 mL Intravenous Once   sodium chloride flush  3 mL Intravenous Q12H   Continuous Infusions:  sodium chloride     sodium chloride 75 mL/hr at 01/01/19 0228   clindamycin (CLEOCIN) IV Stopped (01/01/19 0554)     LOS: 1 day   Geradine Girt  [facility]   How to contact the [facility] Attending or Consulting provider [place] or covering provider during after hours [place], for this patient?  1. Check the care team in [facility] and look for a) attending/consulting [facility] provider listed and b) the [facility] team listed 2. Log into [email or web address] and use [place] universal password to access. If you do not have the password, please contact the hospital operator. 3. Locate the [facility] provider you are looking for under [facility] and page to a number that you can be directly reached. 4. If you still have difficulty reaching the provider, please page the [facility] (Director on Call) for the Hospitalists listed on amion for assistance.  01/01/2019, 9:53 AM

## 2019-01-02 DIAGNOSIS — M79605 Pain in left leg: Secondary | ICD-10-CM

## 2019-01-02 DIAGNOSIS — I890 Lymphedema, not elsewhere classified: Secondary | ICD-10-CM

## 2019-01-02 LAB — CBC
HCT: 30.1 % — ABNORMAL LOW (ref 36.0–46.0)
Hemoglobin: 9.4 g/dL — ABNORMAL LOW (ref 12.0–15.0)
MCH: 27.6 pg (ref 26.0–34.0)
MCHC: 31.2 g/dL (ref 30.0–36.0)
MCV: 88.3 fL (ref 80.0–100.0)
Platelets: 258 10*3/uL (ref 150–400)
RBC: 3.41 MIL/uL — ABNORMAL LOW (ref 3.87–5.11)
RDW: 14.3 % (ref 11.5–15.5)
WBC: 6.2 10*3/uL (ref 4.0–10.5)
nRBC: 0 % (ref 0.0–0.2)

## 2019-01-02 LAB — BASIC METABOLIC PANEL
Anion gap: 7 (ref 5–15)
BUN: 6 mg/dL (ref 6–20)
CO2: 25 mmol/L (ref 22–32)
Calcium: 8.6 mg/dL — ABNORMAL LOW (ref 8.9–10.3)
Chloride: 105 mmol/L (ref 98–111)
Creatinine, Ser: 0.78 mg/dL (ref 0.44–1.00)
GFR calc Af Amer: >60 mL/min (ref >60)
GFR calc non Af Amer: >60 mL/min (ref >60)
Glucose, Bld: 100 mg/dL — ABNORMAL HIGH (ref 70–99)
Potassium: 3.3 mmol/L — ABNORMAL LOW (ref 3.5–5.1)
Sodium: 137 mmol/L (ref 135–145)

## 2019-01-02 MED ORDER — SIMETHICONE 80 MG PO CHEW
80.0000 mg | CHEWABLE_TABLET | Freq: Once | ORAL | Status: AC
  Administered 2019-01-02: 80 mg via ORAL
  Filled 2019-01-02: qty 1

## 2019-01-02 MED ORDER — SENNA 8.6 MG PO TABS
1.0000 | ORAL_TABLET | Freq: Every day | ORAL | Status: DC | PRN
  Administered 2019-01-03: 8.6 mg via ORAL
  Filled 2019-01-02: qty 1

## 2019-01-02 MED ORDER — RISAQUAD PO CAPS
1.0000 | ORAL_CAPSULE | Freq: Every day | ORAL | Status: DC
  Administered 2019-01-02 – 2019-01-06 (×5): 1 via ORAL
  Filled 2019-01-02 (×5): qty 1

## 2019-01-02 MED ORDER — POTASSIUM CHLORIDE CRYS ER 20 MEQ PO TBCR
40.0000 meq | EXTENDED_RELEASE_TABLET | Freq: Once | ORAL | Status: AC
  Administered 2019-01-02: 40 meq via ORAL
  Filled 2019-01-02: qty 2

## 2019-01-02 NOTE — Plan of Care (Signed)
  Problem: Activity: Goal: Risk for activity intolerance will decrease Outcome: Progressing   Problem: Elimination: Goal: Will not experience complications related to urinary retention Outcome: Progressing   Problem: Safety: Goal: Ability to remain free from injury will improve Outcome: Progressing   

## 2019-01-02 NOTE — Progress Notes (Signed)
Progress Note    [name]  [id number] DOB: June 05, 1965  DOA: 12/31/2018 PCP: Gildardo Pounds, NP    Brief Narrative:     Medical records reviewed and are as summarized below:  [name] is an 53 y.o. female with medical history significant of pulmonary embolus x2 DVT morbidly obese chronic anemia, depression, elephantiasis-bilateral lymphatic leg edema came with a chief complaint of subsided in the emergency more right side, associated with shortness of breath,.  Patient has some pain in her LEFT leg associated with area of erythema and increased temperature.   Assessment/Plan:   Active Problems:   Abnormal ECG   Physical deconditioning   Cellulitis   Elephantiasis   Ischemic chest pain (HCC)   History of pulmonary embolus (PE)    Cellulitis LEFT LEG Patient with history of elephantiasis, bilateral lymph edema Pain in the left leg with moderate erythema and increased temperature Ultrasound negative for DVT Patient with severe allergic reaction to penicillin -started on clindamycin in the emergency room, will continue with Clinda for now and monitor for improvement -elevate extremitiy  Chest pain  -resolved -Troponin negative -echo: Left ventricular ejection fraction, by visual estimation, is 65 to 70%. The left ventricle has normal function. Left ventricular septal wall thickness was severely increased. Moderately increased left ventricular posterior wall thickness. There is no  left ventricular hypertrophy.  No pericardial effusion noted  History of DVT and pulmonary embolism -resume home eliquis (apparently missed 5 days at home) -duplex negative for DVT -CTA done but Very limited study for pulmonary embolism due to poor opacification of the pulmonary arteries despite a second try. No obvious large central pulmonary emboli but patient may need V/Q scan if PE remains clinical concern. -denies SOB  Morbidly obese Estimated body mass index is  40.63 kg/m as calculated from the following:   Height as of this encounter: 6' (1.829 m).   Weight as of this encounter: 135.9 kg.  Hypokalemia -replete again   Medical Consultants:    None.    Subjective:   Left leg still tender  Objective:    Vitals:   01/01/19 1957 01/02/19 0029 01/02/19 0506 01/02/19 0850  BP: 140/79 (!) 109/55 136/66 135/66  Pulse: 98 (!) 105 97 (!) 104  Resp: 18 18 18    Temp: (!) 97.5 F (36.4 C) 98.9 F (37.2 C) 98.9 F (37.2 C)   TempSrc: Oral Oral Oral   SpO2: 100% 96% 97%   Weight:   135.9 kg   Height:        Intake/Output Summary (Last 24 hours) at 01/02/2019 0943 Last data filed at 01/02/2019 [id number] Gross per 24 hour  Intake 2501.36 ml  Output 2050 ml  Net 451.36 ml   Filed Weights   01/01/19 1715 01/02/19 0506  Weight: 135.6 kg 135.9 kg   EXAM: In bed, NAD rrr No increased work of breathing +LE edema b/l with chronic changes and thickened skin, left leg warm and tender to palpation A+OX3  Data Reviewed:   I have personally reviewed following labs and imaging studies:  Labs: Labs show the following:   Basic Metabolic Panel: Recent Labs  Lab 12/31/18 1152 01/01/19 0404 01/02/19 0342  NA 141 139 137  K 3.7 3.4* 3.3*  CL 108 107 105  CO2 23 25 25   GLUCOSE 114* 107* 100*  BUN 10 5* 6  CREATININE 1.03* 0.69 0.78  CALCIUM 10.1 9.0 8.6*   GFR Estimated Creatinine Clearance: 130.4 mL/min (by C-G  formula based on SCr of 0.78 mg/dL). Liver Function Tests: Recent Labs  Lab 12/31/18 1339  AST 17  ALT 9  ALKPHOS 56  BILITOT 0.8  PROT 9.0*  ALBUMIN 3.8   Recent Labs  Lab 12/31/18 1339  LIPASE 29   No results for input(s): AMMONIA in the last 168 hours. Coagulation profile No results for input(s): INR, PROTIME in the last 168 hours.  CBC: Recent Labs  Lab 12/31/18 1152 01/01/19 0404 01/02/19 0342  WBC 6.3 7.2 6.2  HGB 11.2* 9.8* 9.4*  HCT 37.4 32.3* 30.1*  MCV 92.3 90.7 88.3  PLT 307 275 258    Cardiac Enzymes: No results for input(s): CKTOTAL, CKMB, CKMBINDEX, TROPONINI in the last 168 hours. BNP (last 3 results) No results for input(s): PROBNP in the last 8760 hours. CBG: Recent Labs  Lab 12/31/18 1236  GLUCAP 82   D-Dimer: No results for input(s): DDIMER in the last 72 hours. Hgb A1c: No results for input(s): HGBA1C in the last 72 hours. Lipid Profile: No results for input(s): CHOL, HDL, LDLCALC, TRIG, CHOLHDL, LDLDIRECT in the last 72 hours. Thyroid function studies: No results for input(s): TSH, T4TOTAL, T3FREE, THYROIDAB in the last 72 hours.  Invalid input(s): FREET3 Anemia work up: No results for input(s): VITAMINB12, FOLATE, FERRITIN, TIBC, IRON, RETICCTPCT in the last 72 hours. Sepsis Labs: Recent Labs  Lab 12/31/18 1152 12/31/18 2031 01/01/19 0404 01/02/19 0342  WBC 6.3  --  7.2 6.2  LATICACIDVEN  --  1.0  --   --     Microbiology Recent Results (from the past 240 hour(s))  Urine culture     Status: Abnormal   Collection Time: 12/31/18  2:25 PM   Specimen: Urine, Random  Result Value Ref Range Status   Specimen Description URINE, RANDOM  Final   Special Requests   Final    NONE Performed at [facility], [address] [place], [place] [address]    Culture MULTIPLE SPECIES PRESENT, SUGGEST RECOLLECTION (A)  Final   Report Status 01/01/2019 FINAL  Final  SARS CORONAVIRUS 2 (TAT 6-24 HRS) Nasopharyngeal Nasopharyngeal Swab     Status: None   Collection Time: 12/31/18 11:24 PM   Specimen: Nasopharyngeal Swab  Result Value Ref Range Status   SARS Coronavirus 2 NEGATIVE NEGATIVE Final    Comment: (NOTE) SARS-CoV-2 target nucleic acids are NOT DETECTED. The SARS-CoV-2 RNA is generally detectable in upper and lower respiratory specimens during the acute phase of infection. Negative results do not preclude SARS-CoV-2 infection, do not rule out co-infections with other pathogens, and should not be used as the sole basis for treatment or  other patient management decisions. Negative results must be combined with clinical observations, patient history, and epidemiological information. The expected result is Negative. Fact Sheet for Patients: SugarRoll.be Fact Sheet for Healthcare Providers: https://www.woods-mathews.com/ This test is not yet approved or cleared by the [place] FDA and  has been authorized for detection and/or diagnosis of SARS-CoV-2 by FDA under an Emergency Use Authorization (EUA). This EUA will remain  in effect (meaning this test can be used) for the duration of the COVID-19 declaration under Section 56 4(b)(1) of the Act, 21 U.S.C. section 360bbb-3(b)(1), unless the authorization is terminated or revoked sooner. Performed at [facility], [place] [address] [place], [place] [address]     Procedures and diagnostic studies:  Ct Angio Chest Pe W And/or Wo Contrast  Result Date: 12/31/2018 CLINICAL DATA:  Pleuritic and exertional chest pain  with shortness of breath EXAM: CT ANGIOGRAPHY CHEST WITH CONTRAST TECHNIQUE: Multidetector CT imaging of the chest was performed using the standard protocol during bolus administration of intravenous contrast. Multiplanar CT image reconstructions and MIPs were obtained to evaluate the vascular anatomy. CONTRAST:  148mL OMNIPAQUE IOHEXOL 350 MG/ML SOLN COMPARISON:  CT chest 05/06/2018 FINDINGS: Cardiovascular: The heart is normal in size. Small pericardial effusion. The aorta is normal in caliber. No dissection. No atherosclerotic calcifications. The branch vessels are patent. Very poor opacification of the pulmonary arteries make the study very limited for pulmonary embolism. This was the second attempt. The patient could not have any more contrast. I do not see any obvious large pulmonary emboli but patient may need a V/Q scan. Mediastinum/Nodes: No mediastinal or hilar mass or adenopathy. The esophagus is grossly normal.  Lungs/Pleura: No acute or significant pulmonary findings. No infiltrates or effusions. No pulmonary edema. No worrisome pulmonary lesions. Upper Abdomen: No significant upper abdominal findings.  A Musculoskeletal: No breast masses are identified. Persistent axillary and subpectoral lymphadenopathy. No significant bony findings. Review of the MIP images confirms the above findings. IMPRESSION: 1. Very limited study for pulmonary embolism due to poor opacification of the pulmonary arteries despite a second try. No obvious large central pulmonary emboli but patient may need V/Q scan if PE remains clinical concern. 2. Normal thoracic aorta. 3. Small pericardial effusion. 4. No acute pulmonary findings. 5. Stable bilateral axillary and subpectoral lymphadenopathy. Electronically Signed   By: Marijo Sanes M.D.   On: 12/31/2018 18:34   Vas [place] Lower Extremity Venous (dvt) (only [facility])  Result Date: 12/31/2018  Lower Venous Study Indications: Pain, Swelling, and Recent history of DVT.  Limitations: Body habitus, poor ultrasound/tissue interface and depth of vessels. Comparison Study: 12/15/2017- right lower extremity DVT involving the right                   posterior tibial and peroneal veins. Performing Technologist: Maudry Mayhew MHA, RDMS, RVT, RDCS  Examination Guidelines: A complete evaluation includes B-mode imaging, spectral Doppler, color Doppler, and power Doppler as needed of all accessible portions of each vessel. Bilateral testing is considered an integral part of a complete examination. Limited examinations for reoccurring indications may be performed as noted.  +---------+---------------+---------+-----------+----------+--------------+ RIGHT    CompressibilityPhasicitySpontaneityPropertiesThrombus Aging +---------+---------------+---------+-----------+----------+--------------+ CFV      Full           Yes      Yes                                  +---------+---------------+---------+-----------+----------+--------------+ SFJ      Full                                                        +---------+---------------+---------+-----------+----------+--------------+ FV Prox  Full                                                        +---------+---------------+---------+-----------+----------+--------------+ FV Mid   Full                                                        +---------+---------------+---------+-----------+----------+--------------+  FV DistalFull                                                        +---------+---------------+---------+-----------+----------+--------------+ PFV      Full                                                        +---------+---------------+---------+-----------+----------+--------------+ POP      Full           Yes      Yes                                 +---------+---------------+---------+-----------+----------+--------------+ PTV      Full                                                        +---------+---------------+---------+-----------+----------+--------------+  +---------+---------------+---------+-----------+----------+--------------+ LEFT     CompressibilityPhasicitySpontaneityPropertiesThrombus Aging +---------+---------------+---------+-----------+----------+--------------+ CFV      Full           Yes      Yes                                 +---------+---------------+---------+-----------+----------+--------------+ SFJ      Full                                                        +---------+---------------+---------+-----------+----------+--------------+ FV Prox  Full                                                        +---------+---------------+---------+-----------+----------+--------------+ FV Mid   Full                                                         +---------+---------------+---------+-----------+----------+--------------+ FV DistalFull                                                        +---------+---------------+---------+-----------+----------+--------------+ PFV      Full                                                        +---------+---------------+---------+-----------+----------+--------------+  POP      Full           Yes      Yes                                 +---------+---------------+---------+-----------+----------+--------------+ PTV      Full                                                        +---------+---------------+---------+-----------+----------+--------------+  Summary: Right: There is no evidence of deep vein thrombosis in the lower extremity. However, portions of this examination were limited- see technologist comments above. No cystic structure found in the popliteal fossa. Ultrasound characteristics of enlarged lymph nodes are noted in the groin. Left: There is no evidence of deep vein thrombosis in the lower extremity. However, portions of this examination were limited- see technologist comments above. No cystic structure found in the popliteal fossa. Ultrasound characteristics of enlarged lymph  nodes noted in the groin.  Unable to visualize bilateral peroneal veins due to technical limitations. *See table(s) above for measurements and observations. Electronically signed by Harold Barban MD on 12/31/2018 at 5:39:59 PM.    Final     Medications:   . acidophilus  1 capsule Oral Daily  . amLODipine  5 mg Oral Daily  . apixaban  5 mg Oral BID  . carvedilol  6.25 mg Oral BID WC  . potassium chloride  40 mEq Oral Once  . sodium chloride flush  3 mL Intravenous Once  . sodium chloride flush  3 mL Intravenous Q12H   Continuous Infusions: . sodium chloride    . clindamycin (CLEOCIN) IV 600 mg (01/02/19 0651)     LOS: 2 days   Geradine Girt  [facility]   How to contact the  [facility] Attending or Consulting provider [place] or covering provider during after hours [place], for this patient?  1. Check the care team in [facility] and look for a) attending/consulting [facility] provider listed and b) the [facility] team listed 2. Log into [email or web address] and use [place]'s universal password to access. If you do not have the password, please contact the hospital operator. 3. Locate the [facility] provider you are looking for under [facility] and page to a number that you can be directly reached. 4. If you still have difficulty reaching the provider, please page the [facility] (Director on Call) for the Hospitalists listed on amion for assistance.  01/02/2019, 9:43 AM
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
## 2019-01-03 LAB — COMPREHENSIVE METABOLIC PANEL
ALT: 9 U/L (ref 0–44)
AST: 12 U/L — ABNORMAL LOW (ref 15–41)
Albumin: 2.8 g/dL — ABNORMAL LOW (ref 3.5–5.0)
Alkaline Phosphatase: 40 U/L (ref 38–126)
Anion gap: 7 (ref 5–15)
BUN: 5 mg/dL — ABNORMAL LOW (ref 6–20)
CO2: 23 mmol/L (ref 22–32)
Calcium: 8.8 mg/dL — ABNORMAL LOW (ref 8.9–10.3)
Chloride: 105 mmol/L (ref 98–111)
Creatinine, Ser: 0.84 mg/dL (ref 0.44–1.00)
GFR calc Af Amer: >60 mL/min (ref >60)
GFR calc non Af Amer: >60 mL/min (ref >60)
Glucose, Bld: 112 mg/dL — ABNORMAL HIGH (ref 70–99)
Potassium: 3.7 mmol/L (ref 3.5–5.1)
Sodium: 135 mmol/L (ref 135–145)
Total Bilirubin: 1.3 mg/dL — ABNORMAL HIGH (ref 0.3–1.2)
Total Protein: 7.3 g/dL (ref 6.5–8.1)

## 2019-01-03 LAB — CBC
HCT: 31.9 % — ABNORMAL LOW (ref 36.0–46.0)
Hemoglobin: 10 g/dL — ABNORMAL LOW (ref 12.0–15.0)
MCH: 27.6 pg (ref 26.0–34.0)
MCHC: 31.3 g/dL (ref 30.0–36.0)
MCV: 88.1 fL (ref 80.0–100.0)
Platelets: 296 10*3/uL (ref 150–400)
RBC: 3.62 MIL/uL — ABNORMAL LOW (ref 3.87–5.11)
RDW: 14.1 % (ref 11.5–15.5)
WBC: 6.9 10*3/uL (ref 4.0–10.5)
nRBC: 0 % (ref 0.0–0.2)

## 2019-01-03 LAB — TROPONIN I (HIGH SENSITIVITY)
Troponin I (High Sensitivity): 4 ng/L (ref <18)
Troponin I (High Sensitivity): 5 ng/L (ref <18)

## 2019-01-03 MED ORDER — KETOROLAC TROMETHAMINE 30 MG/ML IJ SOLN
30.0000 mg | Freq: Once | INTRAMUSCULAR | Status: AC
  Administered 2019-01-03: 30 mg via INTRAVENOUS
  Filled 2019-01-03: qty 1

## 2019-01-03 NOTE — Progress Notes (Signed)
PT. C/o CP, SOB. Blood pressure 138/75, pulse 98, temperature 98.9 F (37.2 C), temperature source Oral, resp. rate 18, height 6' (1.829 m), weight 129.6 kg, last menstrual period 01/31/2011, SpO2 93 %. On call for [facility] paged to make aware. O2 now 100% on room air. EKG to be completed. RN will continue to monitor for changes in condition. Caterina Racine, Katherine Roan

## 2019-01-03 NOTE — Progress Notes (Signed)
Patient was having chest pain of 4/10, ekg obtained. MD paged

## 2019-01-03 NOTE — Progress Notes (Signed)
PROGRESS NOTE    [name]  [id number] DOB: [birth year] DOA: 12/31/2018 PCP: Gildardo Pounds, NP   Brief Narrative:  Patient is a 53 year old female with history of with history of PE, DVT, morbid obesity, chronic anemia, depression, elephantiasis with bilateral lower extremity lymphedema who presented with shortness of breath, pain on the left leg with associated erythema, increased temperature.  Patient was admitted for the management of cellulitis of left lower extremity.  Started on antibiotics.   Assessment & Plan:   Active Problems:   Abnormal ECG   Physical deconditioning   Cellulitis   Elephantiasis   Ischemic chest pain (HCC)   History of pulmonary embolus (PE)   Left leg cellulitis: Has bilateral lymphedema of lower extremities due to history of elephantiasis.  Left lower extremity noted to be erythematous, warmer.  Ultrasound negative for DVT.  Started on clindamycin.  This morning cellulitis looks significantly improved.  She does not have any erythema on left lower extremity.  No open sores or ulcers.  Chest pain: Again complained of chest pain this morning.  Troponins negative.  Echocardiogram showed ejection fraction of 65 to 70%. EKG did not show any ischemic changes.  History of DVT/PE: On Eliquis .  CTA done here did not show any pulmonary emboli.  Morbid obesity: BMI of 38  Hypokalemia: Supplemented  Deconditioning/debility: Ambulates with the help of walker.  Requested PT evaluation.           DVT prophylaxis:Eliquis Code Status: Full Family Communication: None present at the bed side Disposition Plan: Home likely tomorrow.PT evaluation pending   Consultants: None  Procedures:None  Antimicrobials:  Anti-infectives (From admission, onward)   Start     Dose/Rate Route Frequency Ordered Stop   01/01/19 0600  clindamycin (CLEOCIN) IVPB 600 mg     600 mg 100 mL/hr over 30 Minutes Intravenous Every 8 hours 01/01/19 0014     12/31/18  2215  clindamycin (CLEOCIN) IVPB 600 mg     600 mg 100 mL/hr over 30 Minutes Intravenous  Once 12/31/18 2203 12/31/18 2327      Subjective:  Patient seen and examined at the bedside this morning.  Hemodynamically stable.  She was complaining of chest pain earlier this morning which improved subsequently.  Left lower extremity cellulitis looks better today.  Denies any other complaints.  She does not have a PCP  Objective: Vitals:   01/02/19 1933 01/03/19 0134 01/03/19 0422 01/03/19 0844  BP: (!) 150/80 138/75 137/81 130/67  Pulse: 92 98 99 95  Resp: 18 18 18 20   Temp: 98.9 F (37.2 C)  99.9 F (37.7 C) 98.7 F (37.1 C)  TempSrc: Oral Oral Oral Oral  SpO2: 98% 93% 96% 98%  Weight:  129.6 kg    Height:        Intake/Output Summary (Last 24 hours) at 01/03/2019 1132 Last data filed at 01/03/2019 0834 Gross per 24 hour  Intake 720 ml  Output 5225 ml  Net -4505 ml   Filed Weights   01/01/19 1715 01/02/19 0506 01/03/19 0134  Weight: 135.6 kg 135.9 kg 129.6 kg    Examination:  General exam: Not in distress,morbidly obese HEENT:PERRL,Oral mucosa moist, Ear/Nose normal on gross exam Respiratory system: Bilateral equal air entry, normal vesicular breath sounds, no wheezes or crackles  Cardiovascular system: S1 & S2 heard, RRR. No JVD, murmurs, rubs, gallops or clicks. Gastrointestinal system: Abdomen is nondistended, soft and nontender. No organomegaly or masses felt. Normal bowel sounds heard. Central nervous system:  Alert and oriented. No focal neurological deficits. Extremities: Bilateral lower extremity lymphedema, wrinkled skin,no ulcers Skin: No rashes,or ulcers,no icterus ,no pallor.Scattered keloids     Data Reviewed: I have personally reviewed following labs and imaging studies  CBC: Recent Labs  Lab 12/31/18 1152 01/01/19 0404 01/02/19 0342 01/03/19 0350  WBC 6.3 7.2 6.2 6.9  HGB 11.2* 9.8* 9.4* 10.0*  HCT 37.4 32.3* 30.1* 31.9*  MCV 92.3 90.7 88.3 88.1   PLT 307 275 258 0000000   Basic Metabolic Panel: Recent Labs  Lab 12/31/18 1152 01/01/19 0404 01/02/19 0342 01/03/19 0350  NA 141 139 137 135  K 3.7 3.4* 3.3* 3.7  CL 108 107 105 105  CO2 23 25 25 23   GLUCOSE 114* 107* 100* 112*  BUN 10 5* 6 5*  CREATININE 1.03* 0.69 0.78 0.84  CALCIUM 10.1 9.0 8.6* 8.8*   GFR: Estimated Creatinine Clearance: 121 mL/min (by C-G formula based on SCr of 0.84 mg/dL). Liver Function Tests: Recent Labs  Lab 12/31/18 1339 01/03/19 0350  AST 17 12*  ALT 9 9  ALKPHOS 56 40  BILITOT 0.8 1.3*  PROT 9.0* 7.3  ALBUMIN 3.8 2.8*   Recent Labs  Lab 12/31/18 1339  LIPASE 29   No results for input(s): AMMONIA in the last 168 hours. Coagulation Profile: No results for input(s): INR, PROTIME in the last 168 hours. Cardiac Enzymes: No results for input(s): CKTOTAL, CKMB, CKMBINDEX, TROPONINI in the last 168 hours. BNP (last 3 results) No results for input(s): PROBNP in the last 8760 hours. HbA1C: No results for input(s): HGBA1C in the last 72 hours. CBG: Recent Labs  Lab 12/31/18 1236  GLUCAP 82   Lipid Profile: No results for input(s): CHOL, HDL, LDLCALC, TRIG, CHOLHDL, LDLDIRECT in the last 72 hours. Thyroid Function Tests: No results for input(s): TSH, T4TOTAL, FREET4, T3FREE, THYROIDAB in the last 72 hours. Anemia Panel: No results for input(s): VITAMINB12, FOLATE, FERRITIN, TIBC, IRON, RETICCTPCT in the last 72 hours. Sepsis Labs: Recent Labs  Lab 12/31/18 2031  LATICACIDVEN 1.0    Recent Results (from the past 240 hour(s))  Urine culture     Status: Abnormal   Collection Time: 12/31/18  2:25 PM   Specimen: Urine, Random  Result Value Ref Range Status   Specimen Description URINE, RANDOM  Final   Special Requests   Final    NONE Performed at [facility], [address], [place], [place] [address]    Culture MULTIPLE SPECIES PRESENT, SUGGEST RECOLLECTION (A)  Final   Report Status 01/01/2019 FINAL  Final  SARS  CORONAVIRUS 2 (TAT 6-24 HRS) Nasopharyngeal Nasopharyngeal Swab     Status: None   Collection Time: 12/31/18 11:24 PM   Specimen: Nasopharyngeal Swab  Result Value Ref Range Status   SARS Coronavirus 2 NEGATIVE NEGATIVE Final    Comment: (NOTE) SARS-CoV-2 target nucleic acids are NOT DETECTED. The SARS-CoV-2 RNA is generally detectable in upper and lower respiratory specimens during the acute phase of infection. Negative results do not preclude SARS-CoV-2 infection, do not rule out co-infections with other pathogens, and should not be used as the sole basis for treatment or other patient management decisions. Negative results must be combined with clinical observations, patient history, and epidemiological information. The expected result is Negative. Fact Sheet for Patients: SugarRoll.be Fact Sheet for Healthcare Providers: https://www.woods-mathews.com/ This test is not yet approved or cleared by the [place] FDA and  has been authorized for detection and/or diagnosis of SARS-CoV-2 by FDA under  an Emergency Use Authorization (EUA). This EUA will remain  in effect (meaning this test can be used) for the duration of the COVID-19 declaration under Section 56 4(b)(1) of the Act, 21 U.S.C. section 360bbb-3(b)(1), unless the authorization is terminated or revoked sooner. Performed at [facility], [place] [address], [place], [place] [address]          Radiology Studies: No results found.      Scheduled Meds: . acidophilus  1 capsule Oral Daily  . amLODipine  5 mg Oral Daily  . apixaban  5 mg Oral BID  . carvedilol  6.25 mg Oral BID WC  . sodium chloride flush  3 mL Intravenous Once  . sodium chloride flush  3 mL Intravenous Q12H   Continuous Infusions: . sodium chloride 250 mL (01/02/19 1227)  . clindamycin (CLEOCIN) IV 600 mg (01/03/19 0729)     LOS: 3 days    Time spent: 35 mins.More than 50% of that time was  spent in counseling and/or coordination of care.      Shelly Coss, MD [facility] Pager [phone]  If 7PM-7AM, please contact night-coverage [email or web address] Password [facility] 01/03/2019, 11:32 AM

## 2019-01-03 NOTE — Plan of Care (Signed)
  Problem: Clinical Measurements: Goal: Ability to maintain clinical measurements within normal limits will improve Outcome: Progressing   Problem: Pain Managment: Goal: General experience of comfort will improve Outcome: Progressing   

## 2019-01-03 NOTE — Plan of Care (Signed)
  Problem: Elimination: Goal: Will not experience complications related to urinary retention Outcome: Progressing   Problem: Safety: Goal: Ability to remain free from injury will improve Outcome: Progressing   

## 2019-01-04 MED ORDER — CLINDAMYCIN HCL 300 MG PO CAPS
300.0000 mg | ORAL_CAPSULE | Freq: Three times a day (TID) | ORAL | Status: DC
  Administered 2019-01-04 – 2019-01-06 (×7): 300 mg via ORAL
  Filled 2019-01-04 (×8): qty 1

## 2019-01-04 NOTE — Progress Notes (Signed)
PROGRESS NOTE    [name]  [id number] DOB: [birth year] DOA: 12/31/2018 PCP: Gildardo Pounds, NP   Brief Narrative:  Patient is a 53 year old female with history of with history of PE, DVT, morbid obesity, chronic anemia, depression, elephantiasis with bilateral lower extremity lymphedema who presented with shortness of breath, pain on the left leg with associated erythema, increased temperature.  Patient was admitted for the management of cellulitis of left lower extremity.  Started on antibiotics. Left lower extremity cellulitis improving with antibiotics.  PT evaluated her and recommended skilled nursing facility on discharge.SW consulted. Patient is medically stable for discharge to skilled nursing facility as soon as bed is available.   Assessment & Plan:   Active Problems:   Abnormal ECG   Physical deconditioning   Cellulitis   Elephantiasis   Ischemic chest pain (HCC)   History of pulmonary embolus (PE)   Left leg cellulitis: Has bilateral lymphedema of lower extremities due to history of elephantiasis.  Left lower extremity noted to be erythematous, warmer.  Ultrasound negative for DVT.  Started on clindamycin.  Left extremity  cellulitis looks significantly improved.  She does not have any erythema on left lower extremity.  No open sores or ulcers.  Chest pain: Resolved.  Troponins negative.  Echocardiogram showed ejection fraction of 65 to 70%. EKG did not show any ischemic changes.  History of DVT/PE: On Eliquis .  CTA done here did not show any pulmonary emboli.  Morbid obesity: BMI of 38  Hypokalemia: Supplemented  Deconditioning/debility: Ambulates with the help of walker.  PT recommended SNF          DVT prophylaxis:Eliquis Code Status: Full Family Communication: None present at the bed side Disposition Plan: Skilled nursing facility soon as bed is available   Consultants: None  Procedures:None  Antimicrobials:  Anti-infectives (From  admission, onward)   Start     Dose/Rate Route Frequency Ordered Stop   01/01/19 0600  clindamycin (CLEOCIN) IVPB 600 mg     600 mg 100 mL/hr over 30 Minutes Intravenous Every 8 hours 01/01/19 0014     12/31/18 2215  clindamycin (CLEOCIN) IVPB 600 mg     600 mg 100 mL/hr over 30 Minutes Intravenous  Once 12/31/18 2203 12/31/18 2327      Subjective:  Patient seen and examined the bedside this morning.  Hemodynamically stable.  Left lower extremity cellulitis looks significantly better.  She has been recommended skilled nursing facilty by the physical therapy.  Objective: Vitals:   01/04/19 0236 01/04/19 0516 01/04/19 0541 01/04/19 1136  BP: 120/66 130/72  133/73  Pulse: 81 84  84  Resp:  20  15  Temp:  98.4 F (36.9 C)  99 F (37.2 C)  TempSrc:  Oral  Oral  SpO2:  98%  98%  Weight:   129.1 kg   Height:        Intake/Output Summary (Last 24 hours) at 01/04/2019 1154 Last data filed at 01/04/2019 1138 Gross per 24 hour  Intake 123 ml  Output 3000 ml  Net -2877 ml   Filed Weights   01/02/19 0506 01/03/19 0134 01/04/19 0541  Weight: 135.9 kg 129.6 kg 129.1 kg    Examination:  General exam: Not in distress,morbidly obese HEENT:PERRL,Oral mucosa moist, Ear/Nose normal on gross exam Respiratory system: Bilateral equal air entry, normal vesicular breath sounds, no wheezes or crackles  Cardiovascular system: S1 & S2 heard, RRR. No JVD, murmurs, rubs, gallops or clicks. Gastrointestinal system: Abdomen is nondistended, soft  and nontender. No organomegaly or masses felt. Normal bowel sounds heard. Central nervous system: Alert and oriented. No focal neurological deficits. Extremities: Bilateral lower extremity lymphedema, wrinkled skin,no ulcers.improved cellulitis on the LLE Skin: No rashes,or ulcers,no icterus ,no pallor.Scattered keloids     Data Reviewed: I have personally reviewed following labs and imaging studies  CBC: Recent Labs  Lab 12/31/18 1152 01/01/19  0404 01/02/19 0342 01/03/19 0350  WBC 6.3 7.2 6.2 6.9  HGB 11.2* 9.8* 9.4* 10.0*  HCT 37.4 32.3* 30.1* 31.9*  MCV 92.3 90.7 88.3 88.1  PLT 307 275 258 0000000   Basic Metabolic Panel: Recent Labs  Lab 12/31/18 1152 01/01/19 0404 01/02/19 0342 01/03/19 0350  NA 141 139 137 135  K 3.7 3.4* 3.3* 3.7  CL 108 107 105 105  CO2 23 25 25 23   GLUCOSE 114* 107* 100* 112*  BUN 10 5* 6 5*  CREATININE 1.03* 0.69 0.78 0.84  CALCIUM 10.1 9.0 8.6* 8.8*   GFR: Estimated Creatinine Clearance: 120.8 mL/min (by C-G formula based on SCr of 0.84 mg/dL). Liver Function Tests: Recent Labs  Lab 12/31/18 1339 01/03/19 0350  AST 17 12*  ALT 9 9  ALKPHOS 56 40  BILITOT 0.8 1.3*  PROT 9.0* 7.3  ALBUMIN 3.8 2.8*   Recent Labs  Lab 12/31/18 1339  LIPASE 29   No results for input(s): AMMONIA in the last 168 hours. Coagulation Profile: No results for input(s): INR, PROTIME in the last 168 hours. Cardiac Enzymes: No results for input(s): CKTOTAL, CKMB, CKMBINDEX, TROPONINI in the last 168 hours. BNP (last 3 results) No results for input(s): PROBNP in the last 8760 hours. HbA1C: No results for input(s): HGBA1C in the last 72 hours. CBG: Recent Labs  Lab 12/31/18 1236  GLUCAP 82   Lipid Profile: No results for input(s): CHOL, HDL, LDLCALC, TRIG, CHOLHDL, LDLDIRECT in the last 72 hours. Thyroid Function Tests: No results for input(s): TSH, T4TOTAL, FREET4, T3FREE, THYROIDAB in the last 72 hours. Anemia Panel: No results for input(s): VITAMINB12, FOLATE, FERRITIN, TIBC, IRON, RETICCTPCT in the last 72 hours. Sepsis Labs: Recent Labs  Lab 12/31/18 2031  LATICACIDVEN 1.0    Recent Results (from the past 240 hour(s))  Urine culture     Status: Abnormal   Collection Time: 12/31/18  2:25 PM   Specimen: Urine, Random  Result Value Ref Range Status   Specimen Description URINE, RANDOM  Final   Special Requests   Final    NONE Performed at [facility], [address],  [place], [place] [address]    Culture MULTIPLE SPECIES PRESENT, SUGGEST RECOLLECTION (A)  Final   Report Status 01/01/2019 FINAL  Final  SARS CORONAVIRUS 2 (TAT 6-24 HRS) Nasopharyngeal Nasopharyngeal Swab     Status: None   Collection Time: 12/31/18 11:24 PM   Specimen: Nasopharyngeal Swab  Result Value Ref Range Status   SARS Coronavirus 2 NEGATIVE NEGATIVE Final    Comment: (NOTE) SARS-CoV-2 target nucleic acids are NOT DETECTED. The SARS-CoV-2 RNA is generally detectable in upper and lower respiratory specimens during the acute phase of infection. Negative results do not preclude SARS-CoV-2 infection, do not rule out co-infections with other pathogens, and should not be used as the sole basis for treatment or other patient management decisions. Negative results must be combined with clinical observations, patient history, and epidemiological information. The expected result is Negative. Fact Sheet for Patients: SugarRoll.be Fact Sheet for Healthcare Providers: https://www.woods-mathews.com/ This test is not yet approved or cleared by  the [employer] and  has been authorized for detection and/or diagnosis of SARS-CoV-2 by FDA under an Emergency Use Authorization (EUA). This EUA will remain  in effect (meaning this test can be used) for the duration of the COVID-19 declaration under Section 56 4(b)(1) of the Act, 21 U.S.C. section 360bbb-3(b)(1), unless the authorization is terminated or revoked sooner. Performed at [facility], [place] [address] [place], [place] [address]          Radiology Studies: No results found.      Scheduled Meds: . acidophilus  1 capsule Oral Daily  . amLODipine  5 mg Oral Daily  . apixaban  5 mg Oral BID  . carvedilol  6.25 mg Oral BID WC  . sodium chloride flush  3 mL Intravenous Once  . sodium chloride flush  3 mL Intravenous Q12H   Continuous Infusions: . sodium chloride Stopped  (01/03/19 2328)  . clindamycin (CLEOCIN) IV 600 mg (01/04/19 0541)     LOS: 4 days    Time spent: 35 mins.More than 50% of that time was spent in counseling and/or coordination of care.      Shelly Coss, MD [facility] Pager [phone]  If 7PM-7AM, please contact night-coverage [email or web address] Password [facility] 01/04/2019, 11:54 AM

## 2019-01-04 NOTE — Evaluation (Signed)
Physical Therapy Evaluation Patient Details Name: [name] MRN: [id number] DOB: [birth year] Today's Date: 01/04/2019   History of Present Illness  53 year old female with history of with history of PE, DVT, morbid obesity, chronic anemia, depression, elephantiasis with bilateral lower extremity lymphedema who presented with shortness of breath, pain on the left leg with associated erythema, increased temperature.  Patient was admitted for the management of cellulitis of left lower extremity.    Clinical Impression  Pt admitted with above diagnosis. On eval, pt required mod assist bed mobility and mod assist standing attempts with RW. Pt unable to attain full upright stance due to LLE. Pt returned to supine in bed at end of session. Pt currently with functional limitations due to the deficits listed below (see PT Problem List). Pt will benefit from skilled PT to increase their independence and safety with mobility to allow discharge to the venue listed below.       Follow Up Recommendations SNF    Equipment Recommendations  None recommended by PT    Recommendations for Other Services       Precautions / Restrictions Precautions Precautions: Fall Restrictions Weight Bearing Restrictions: No      Mobility  Bed Mobility Overal bed mobility: Needs Assistance Bed Mobility: Supine to Sit;Sit to Supine     Supine to sit: Mod assist;HOB elevated Sit to supine: Mod assist;HOB elevated   General bed mobility comments: +rail, increased time  Transfers Overall transfer level: Needs assistance Equipment used: Rolling walker (2 wheeled) Transfers: Sit to/from Stand Sit to Stand: Mod assist         General transfer comment: Pt unable to attain full upright stance due to LLE pain. Attempted x 3 trials.  Ambulation/Gait             General Gait Details: unable  Stairs            Wheelchair Mobility    Modified Rankin (Stroke Patients Only)        Balance Overall balance assessment: Needs assistance Sitting-balance support: No upper extremity supported;Feet supported Sitting balance-Leahy Scale: Good                                       Pertinent Vitals/Pain Pain Assessment: Faces Faces Pain Scale: Hurts little more Pain Location: LLE Pain Descriptors / Indicators: Grimacing;Discomfort;Tender Pain Intervention(s): Monitored during session;Repositioned;Limited activity within patient's tolerance    Home Living Family/patient expects to be discharged to:: Private residence Living Arrangements: Children;Parent Available Help at Discharge: Family;Available PRN/intermittently Type of Home: House Home Access: Stairs to enter   Entrance Stairs-Number of Steps: 3 Home Layout: Two level Home Equipment: [place] - 2 wheels;Walker - 4 wheels;Cane - single point;Bedside commode;Shower seat      Prior Function Level of Independence: Needs assistance   Gait / Transfers Assistance Needed: ambulates with bilat platform RW vs rollator (keeps an AD on each level of her house). Crawls up/down stairs.  ADL's / Homemaking Assistance Needed: Occassional assist with sponge bathing. Pt unable to get in/out of tub. Family provides meals.        Hand Dominance        Extremity/Trunk Assessment   Upper Extremity Assessment Upper Extremity Assessment: Generalized weakness    Lower Extremity Assessment Lower Extremity Assessment: Generalized weakness;RLE deficits/detail;LLE deficits/detail RLE Deficits / Details: chronic edema BLE LLE Deficits / Details: chronic edema BLE  Communication   Communication: No difficulties  Cognition Arousal/Alertness: Awake/alert Behavior During Therapy: WFL for tasks assessed/performed Overall Cognitive Status: Within Functional Limits for tasks assessed                                        General Comments      Exercises     Assessment/Plan    PT  Assessment Patient needs continued PT services  PT Problem List Decreased strength;Decreased mobility;Decreased activity tolerance;Decreased balance;Pain       PT Treatment Interventions Therapeutic activities;Gait training;Therapeutic exercise;Patient/family education;Balance training;Stair training;Functional mobility training    PT Goals (Current goals can be found in the Care Plan section)  Acute Rehab PT Goals Patient Stated Goal: decrease pain and get stronger PT Goal Formulation: With patient Time For Goal Achievement: 01/18/19 Potential to Achieve Goals: Fair    Frequency Min 2X/week   Barriers to discharge Inaccessible home environment 2-level house with stairs to enter    Co-evaluation               AM-PAC PT "6 Clicks" Mobility  Outcome Measure Help needed turning from your back to your side while in a flat bed without using bedrails?: A Little Help needed moving from lying on your back to sitting on the side of a flat bed without using bedrails?: A Lot Help needed moving to and from a bed to a chair (including a wheelchair)?: A Lot Help needed standing up from a chair using your arms (e.g., wheelchair or bedside chair)?: A Lot Help needed to walk in hospital room?: A Lot Help needed climbing 3-5 steps with a railing? : Total 6 Click Score: 12    End of Session   Activity Tolerance: Patient limited by pain Patient left: in bed;with call bell/phone within reach Nurse Communication: Mobility status PT Visit Diagnosis: Other abnormalities of gait and mobility (R26.89);Muscle weakness (generalized) (M62.81);Pain Pain - Right/Left: Left Pain - part of body: Leg    Time: [id number] PT Time Calculation (min) (ACUTE ONLY): 18 min   Charges:   PT Evaluation $PT Eval Moderate Complexity: 1 Mod          Lorrin Goodell, PT  Office # [phone] Pager [phone]   Lorriane Shire 01/04/2019, 11:14 AM

## 2019-01-05 ENCOUNTER — Encounter (HOSPITAL_COMMUNITY): Payer: Self-pay

## 2019-01-05 MED ORDER — FUROSEMIDE 20 MG PO TABS
20.0000 mg | ORAL_TABLET | Freq: Every day | ORAL | Status: DC
  Administered 2019-01-05 – 2019-01-06 (×2): 20 mg via ORAL
  Filled 2019-01-05 (×2): qty 1

## 2019-01-05 MED ORDER — IBUPROFEN 600 MG PO TABS: 800.0000 mg | ORAL_TABLET | Freq: Four times a day (QID) | ORAL | Status: DC | PRN

## 2019-01-05 MED ORDER — OXYCODONE HCL 5 MG PO TABS: 15.0000 mg | ORAL_TABLET | ORAL | Status: DC | PRN

## 2019-01-05 MED ORDER — DULOXETINE HCL 20 MG PO CPEP
20.0000 mg | ORAL_CAPSULE | Freq: Every day | ORAL | Status: DC
  Administered 2019-01-05 – 2019-01-06 (×2): 20 mg via ORAL
  Filled 2019-01-05 (×3): qty 1

## 2019-01-05 NOTE — TOC Initial Note (Signed)
Transition of Care [facility]) - Initial/Assessment Note    Patient Details  Name: [name] MRN: [id number] Date of Birth: Sep 28, 1965  Transition of Care [facility]) CM/SW Contact:    Bary Castilla, LCSW Phone Number: [phone] 01/05/2019, 2:58 PM  Clinical Narrative:                  CSW met with patient bedside to discuss the SNF recommendation. Patient was aware of recommendation and was in agreement. 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 CSW provided patient with medicare.gov rating list. Patient informed CSW that she was willing to go to a facility in [place] or [place]. Patient stated that she prefers [facility] due to it being near home.  Patient's PASRR is under review.  TOC team will continue to follow for discharge planning needs.  Expected Discharge Plan: Skilled Nursing Facility Barriers to Discharge: Insurance Authorization, SNF Pending bed offer   Patient Goals and CMS Choice Patient states their goals for this hospitalization and ongoing recovery are:: I want to be able to walk with cane CMS Medicare.gov Compare Post Acute Care list provided to:: Patient Choice offered to / list presented to : Patient  Expected Discharge Plan and Services Expected Discharge Plan: [place]       Living arrangements for the past 2 months: Single Family Home                                      Prior Living Arrangements/Services Living arrangements for the past 2 months: Single Family Home Lives with:: Self, Adult Children, Minor Children, Parents Patient language and need for interpreter reviewed:: Yes Do you feel safe going back to the place where you live?: Yes               Activities of Daily Living Home Assistive Devices/Equipment: Environmental consultant (specify type), Shower chair with back(about shower chair "I have one but I can't get in it") ADL Screening (condition at time of  admission) Patient's cognitive ability adequate to safely complete daily activities?: Yes Is the patient deaf or have difficulty hearing?: No Does the patient have difficulty seeing, even when wearing glasses/contacts?: No Does the patient have difficulty concentrating, remembering, or making decisions?: No Patient able to express need for assistance with ADLs?: Yes Does the patient have difficulty dressing or bathing?: Yes Independently performs ADLs?: No Communication: Independent Dressing (OT): Needs assistance Is this a change from baseline?: Pre-admission baseline Grooming: Needs assistance Is this a change from baseline?: Pre-admission baseline Feeding: Independent Bathing: Needs assistance Is this a change from baseline?: Pre-admission baseline Toileting: Needs assistance Is this a change from baseline?: Pre-admission baseline In/Out Bed: Needs assistance Is this a change from baseline?: Pre-admission baseline Walks in Home: Independent with device (comment) Does the patient have difficulty walking or climbing stairs?: Yes Weakness of Legs: None Weakness of Arms/Hands: Both  Permission Sought/Granted Permission sought to share information with : Family Supports Permission granted to share information with : Yes, Verbal Permission Granted  Share Information with NAME: Olegario Shearer  Permission granted to share info w AGENCY: SNF  Permission granted to share info w Relationship: mother  Permission granted to share info w Contact Information: [phone]  Emotional Assessment Appearance:: Appears stated age Attitude/Demeanor/Rapport: Engaged Affect (typically observed): Adaptable, Accepting Orientation: : Oriented to Self, Oriented to  Place, Oriented to  Time, Oriented to Situation      Admission diagnosis:  Shortness of breath [R06.02] Lightheadedness [R42] Tachycardia [R00.0] Pain of left lower extremity [M79.605] Chest pain, unspecified type [R07.9] Cellulitis of right  leg [L03.115] Patient Active Problem List   Diagnosis Date Noted  . Cellulitis 12/31/2018  . Elephantiasis 12/31/2018  . Ischemic chest pain (HCC) 12/31/2018  . History of pulmonary embolus (PE) 12/31/2018  . Physical deconditioning 08/27/2018  . Adenopathy 09/15/2017  . Family history of colon cancer 08/01/2017  . Taking multiple medications for chronic disease 08/01/2017  . Arthritis   . Axillary lymphadenopathy 01/26/2017  . Urinary incontinence 01/25/2017  . Hypokalemia 01/25/2017  . Lobar pneumonia (HCC) 01/25/2017  . Pressure ulcer of sacral region, stage 2 (HCC) 01/25/2017  . Abnormal ECG 01/29/2013  . Dyspnea   . Menorrhagia with regular cycle 02/22/2011  . Anemia 02/22/2011   PCP:  Fleming, [name] W, NP Pharmacy:   [facility] [place], [place] - [address] [place] [address] Phone: [phone] Fax: [phone]  [id number] - [place], [place] - [address] AT [address] OF [address] [place] [address] Phone: [phone] Fax: [phone]     Social Determinants of Health (SDOH) Interventions    Readmission Risk Interventions No flowsheet data found.  

## 2019-01-05 NOTE — Progress Notes (Addendum)
PROGRESS NOTE    [name]  [id number] DOB: [birth year] DOA: 12/31/2018 PCP: Gildardo Pounds, NP   Brief Narrative:  Patient is a 53 year old female with history of with history of PE, DVT, morbid obesity, chronic anemia, depression, elephantiasis with bilateral lower extremity lymphedema who presented with shortness of breath, pain on the left leg with associated erythema, increased temperature.  Patient was admitted for the management of cellulitis of left lower extremity.  Started on antibiotics. Left lower extremity cellulitis improving with antibiotics.  PT evaluated her and recommended skilled nursing facility on discharge.SW consulted. Patient is medically stable for discharge to skilled nursing facility as soon as bed is available.   Assessment & Plan:   Active Problems:   Abnormal ECG   Physical deconditioning   Cellulitis   Elephantiasis   Ischemic chest pain (HCC)   History of pulmonary embolus (PE)   Left leg cellulitis: Has bilateral lymphedema of lower extremities due to history of elephantiasis.  Left lower extremity noted to be erythematous, warmer.  Ultrasound negative for DVT.  Started on clindamycin.  Left extremity  cellulitis looks significantly improved.  She does not have any erythema on left lower extremity.  No open sores or ulcers.Changed antibiotic to oral.  Chest pain: Complains  of on and off chest pain  starting from the left neck radiating to the left shoulder.  Her chest pain is atypical.  Reproducible.  Troponins negative.  Echocardiogram showed ejection fraction of 65 to 70%.CT angiogram did not show any intrathoracic abnormality.Her respiratory status is stable.Chest pain is most likley musculoskeletal in etiology. EKG did not show any ischemic changes.  Continue NSAIDs as needed.  History of DVT/PE: On Eliquis .  CTA done here did not show any pulmonary emboli.  Morbid obesity: BMI of 38  Hypokalemia: Supplemented   Deconditioning/debility: Ambulates with the help of walker.  PT recommended SNF          DVT prophylaxis:Eliquis Code Status: Full Family Communication: called her mother on phone,call not received Disposition Plan: Skilled nursing facility soon as bed is available   Consultants: None  Procedures:None  Antimicrobials:  Anti-infectives (From admission, onward)   Start     Dose/Rate Route Frequency Ordered Stop   01/04/19 1400  clindamycin (CLEOCIN) capsule 300 mg     300 mg Oral Every 8 hours 01/04/19 1157     01/01/19 0600  clindamycin (CLEOCIN) IVPB 600 mg  Status:  Discontinued     600 mg 100 mL/hr over 30 Minutes Intravenous Every 8 hours 01/01/19 0014 01/04/19 1157   12/31/18 2215  clindamycin (CLEOCIN) IVPB 600 mg     600 mg 100 mL/hr over 30 Minutes Intravenous  Once 12/31/18 2203 12/31/18 2327      Subjective:  Patient seen and examined the bedside this morning.  Hemodynamically stable.  Complains of left-sided chest pain this morning.  Cellulitis of the left lower extremity is stable.  Objective: Vitals:   01/04/19 1713 01/04/19 1956 01/05/19 0434 01/05/19 0730  BP: 128/78 121/64 (!) 141/70 (!) 150/76  Pulse: 81 81 86 85  Resp: 19 18 18    Temp: 99.8 F (37.7 C) 99.8 F (37.7 C) 98.2 F (36.8 C) 99.3 F (37.4 C)  TempSrc: Oral Oral Oral Oral  SpO2: 100% 97% 96% 98%  Weight:   126.1 kg   Height:        Intake/Output Summary (Last 24 hours) at 01/05/2019 1136 Last data filed at 01/05/2019 0854 Gross per 24 hour  Intake 1287 ml  Output 3950 ml  Net -2663 ml   Filed Weights   01/03/19 0134 01/04/19 0541 01/05/19 0434  Weight: 129.6 kg 129.1 kg 126.1 kg    Examination:  General exam: Not in distress,morbidly obese HEENT:PERRL,Oral mucosa moist, Ear/Nose normal on gross exam Respiratory system: Bilateral equal air entry, normal vesicular breath sounds, no wheezes or crackles  Cardiovascular system: S1 & S2 heard, RRR. No JVD, murmurs, rubs,  gallops or clicks. Gastrointestinal system: Abdomen is nondistended, soft and nontender. No organomegaly or masses felt. Normal bowel sounds heard. Central nervous system: Alert and oriented. No focal neurological deficits. Extremities: Bilateral lower extremity lymphedema, wrinkled skin,no ulcers.improved cellulitis on the LLE Skin: No rashes,or ulcers,no icterus ,no pallor.Scattered keloids     Data Reviewed: I have personally reviewed following labs and imaging studies  CBC: Recent Labs  Lab 12/31/18 1152 01/01/19 0404 01/02/19 0342 01/03/19 0350  WBC 6.3 7.2 6.2 6.9  HGB 11.2* 9.8* 9.4* 10.0*  HCT 37.4 32.3* 30.1* 31.9*  MCV 92.3 90.7 88.3 88.1  PLT 307 275 258 0000000   Basic Metabolic Panel: Recent Labs  Lab 12/31/18 1152 01/01/19 0404 01/02/19 0342 01/03/19 0350  NA 141 139 137 135  K 3.7 3.4* 3.3* 3.7  CL 108 107 105 105  CO2 23 25 25 23   GLUCOSE 114* 107* 100* 112*  BUN 10 5* 6 5*  CREATININE 1.03* 0.69 0.78 0.84  CALCIUM 10.1 9.0 8.6* 8.8*   GFR: Estimated Creatinine Clearance: 119.3 mL/min (by C-G formula based on SCr of 0.84 mg/dL). Liver Function Tests: Recent Labs  Lab 12/31/18 1339 01/03/19 0350  AST 17 12*  ALT 9 9  ALKPHOS 56 40  BILITOT 0.8 1.3*  PROT 9.0* 7.3  ALBUMIN 3.8 2.8*   Recent Labs  Lab 12/31/18 1339  LIPASE 29   No results for input(s): AMMONIA in the last 168 hours. Coagulation Profile: No results for input(s): INR, PROTIME in the last 168 hours. Cardiac Enzymes: No results for input(s): CKTOTAL, CKMB, CKMBINDEX, TROPONINI in the last 168 hours. BNP (last 3 results) No results for input(s): PROBNP in the last 8760 hours. HbA1C: No results for input(s): HGBA1C in the last 72 hours. CBG: Recent Labs  Lab 12/31/18 1236  GLUCAP 82   Lipid Profile: No results for input(s): CHOL, HDL, LDLCALC, TRIG, CHOLHDL, LDLDIRECT in the last 72 hours. Thyroid Function Tests: No results for input(s): TSH, T4TOTAL, FREET4, T3FREE,  THYROIDAB in the last 72 hours. Anemia Panel: No results for input(s): VITAMINB12, FOLATE, FERRITIN, TIBC, IRON, RETICCTPCT in the last 72 hours. Sepsis Labs: Recent Labs  Lab 12/31/18 2031  LATICACIDVEN 1.0    Recent Results (from the past 240 hour(s))  Urine culture     Status: Abnormal   Collection Time: 12/31/18  2:25 PM   Specimen: Urine, Random  Result Value Ref Range Status   Specimen Description URINE, RANDOM  Final   Special Requests   Final    NONE Performed at [facility], [address], [place], [place] [address]    Culture MULTIPLE SPECIES PRESENT, SUGGEST RECOLLECTION (A)  Final   Report Status 01/01/2019 FINAL  Final  SARS CORONAVIRUS 2 (TAT 6-24 HRS) Nasopharyngeal Nasopharyngeal Swab     Status: None   Collection Time: 12/31/18 11:24 PM   Specimen: Nasopharyngeal Swab  Result Value Ref Range Status   SARS Coronavirus 2 NEGATIVE NEGATIVE Final    Comment: (NOTE) SARS-CoV-2 target nucleic acids are NOT DETECTED. The SARS-CoV-2  RNA is generally detectable in upper and lower respiratory specimens during the acute phase of infection. Negative results do not preclude SARS-CoV-2 infection, do not rule out co-infections with other pathogens, and should not be used as the sole basis for treatment or other patient management decisions. Negative results must be combined with clinical observations, patient history, and epidemiological information. The expected result is Negative. Fact Sheet for Patients: SugarRoll.be Fact Sheet for Healthcare Providers: https://www.woods-mathews.com/ This test is not yet approved or cleared by the [place] FDA and  has been authorized for detection and/or diagnosis of SARS-CoV-2 by FDA under an Emergency Use Authorization (EUA). This EUA will remain  in effect (meaning this test can be used) for the duration of the COVID-19 declaration under Section 56 4(b)(1) of the Act, 21  U.S.C. section 360bbb-3(b)(1), unless the authorization is terminated or revoked sooner. Performed at [facility], [place] [address] [place], [place] [address]          Radiology Studies: No results found.      Scheduled Meds: . acidophilus  1 capsule Oral Daily  . amLODipine  5 mg Oral Daily  . apixaban  5 mg Oral BID  . carvedilol  6.25 mg Oral BID WC  . clindamycin  300 mg Oral Q8H  . sodium chloride flush  3 mL Intravenous Once  . sodium chloride flush  3 mL Intravenous Q12H   Continuous Infusions: . sodium chloride Stopped (01/03/19 2328)     LOS: 5 days    Time spent: 35 mins.More than 50% of that time was spent in counseling and/or coordination of care.      Shelly Coss, MD [facility] Pager [phone]  If 7PM-7AM, please contact night-coverage [email or web address] Password [facility] 01/05/2019, 11:36 AM

## 2019-01-05 NOTE — NC FL2 (Signed)
[place] LEVEL OF CARE SCREENING TOOL     IDENTIFICATION  Patient Name: [name] Birthdate: 07-Apr-1965 Sex: female Admission Date (Current Location): 12/31/2018  [facility] and [place] Number:  Herbalist and Address:  [facility] [place], [place] [address] [place], [place] [address]      Provider Number: [id number]  Attending Physician Name and Address:  Shelly Coss, MD  Relative Name and Phone Number:  Charlett Nose [phone]    Current Level of Care: Hospital Recommended Level of Care: [place] Prior Approval Number:    Date Approved/Denied:   PASRR Number: Under review  Discharge Plan: SNF    Current Diagnoses: Patient Active Problem List   Diagnosis Date Noted  . Cellulitis 12/31/2018  . Elephantiasis 12/31/2018  . Ischemic chest pain ([place]) 12/31/2018  . History of pulmonary embolus (PE) 12/31/2018  . Physical deconditioning 08/27/2018  . Adenopathy 09/15/2017  . Family history of colon cancer 08/01/2017  . Taking multiple medications for chronic disease 08/01/2017  . Arthritis   . Axillary lymphadenopathy 01/26/2017  . Urinary incontinence 01/25/2017  . Hypokalemia 01/25/2017  . Lobar pneumonia ([place]) 01/25/2017  . Pressure ulcer of sacral region, stage 2 ([place]) 01/25/2017  . Abnormal ECG 01/29/2013  . Dyspnea   . Menorrhagia with regular cycle 02/22/2011  . Anemia 02/22/2011    Orientation RESPIRATION BLADDER Height & Weight     Self, Time, Situation  Normal External catheter, Incontinent Weight: 278 lb (126.1 kg) Height:  6' (182.9 cm)  BEHAVIORAL SYMPTOMS/MOOD NEUROLOGICAL BOWEL NUTRITION STATUS      Continent Diet(heart healthy)  AMBULATORY STATUS COMMUNICATION OF NEEDS Skin   Extensive Assist Verbally Skin abrasions(cellulitis)                       Personal Care Assistance Level of Assistance  Bathing, Feeding, Dressing Bathing Assistance: Limited assistance Feeding  assistance: Independent Dressing Assistance: Limited assistance     Functional Limitations Info  Sight, Hearing, Speech Sight Info: Impaired Hearing Info: Adequate Speech Info: Adequate    SPECIAL CARE FACTORS FREQUENCY  PT (By licensed PT), OT (By licensed OT)     PT Frequency: 5x per week OT Frequency: 5x per week            Contractures Contractures Info: Not present    Additional Factors Info  Code Status, Allergies Code Status Info: Full Allergies Info: Bee Venom, Augmentin,Amoxicillin-pot Clavulanate,Latex, Nulytely           Current Medications (01/05/2019):  This is the current hospital active medication list Current Facility-Administered Medications  Medication Dose Route Frequency Provider Last Rate Last Dose  . 0.9 %  sodium chloride infusion  250 mL Intravenous PRN Cristescu, Linard Millers, MD   Stopped at 01/03/19 2328  . acetaminophen (TYLENOL) tablet 650 mg  650 mg Oral Q6H PRN Cristescu, Mircea G, MD   650 mg at 01/05/19 1009   Or  . acetaminophen (TYLENOL) suppository 650 mg  650 mg Rectal Q6H PRN Cristescu, Mircea G, MD      . acidophilus (RISAQUAD) capsule 1 capsule  1 capsule Oral Daily Vann, Jessica U, DO   1 capsule at 01/05/19 0955  . amLODipine (NORVASC) tablet 5 mg  5 mg Oral Daily Cristescu, Linard Millers, MD   5 mg at 01/05/19 0955  . apixaban (ELIQUIS) tablet 5 mg  5 mg Oral BID Cristescu, Linard Millers, MD   5 mg at 01/05/19 0955  .  carvedilol (COREG) tablet 6.25 mg  6.25 mg Oral BID WC Charlesetta Shanks, MD   6.25 mg at 01/05/19 0955  . clindamycin (CLEOCIN) capsule 300 mg  300 mg Oral Q8H Adhikari, Amrit, MD   300 mg at 01/05/19 1438  . DULoxetine (CYMBALTA) DR capsule 20 mg  20 mg Oral Daily Shelly Coss, MD   20 mg at 01/05/19 1438  . furosemide (LASIX) tablet 20 mg  20 mg Oral Daily Shelly Coss, MD   20 mg at 01/05/19 1438  . ibuprofen (ADVIL) tablet 800 mg  800 mg Oral Q6H PRN Shelly Coss, MD      . metoprolol tartrate (LOPRESSOR) injection  2.5 mg  2.5 mg Intravenous Q6H PRN Cristescu, Mircea G, MD      . oxyCODONE (Oxy IR/ROXICODONE) immediate release tablet 15 mg  15 mg Oral Q4H PRN Shelly Coss, MD      . senna (SENOKOT) tablet 8.6 mg  1 tablet Oral Daily PRN Eulogio Bear U, DO   8.6 mg at 01/03/19 1005  . sodium chloride flush (NS) 0.9 % injection 3 mL  3 mL Intravenous Once Tegeler, Gwenyth Allegra, MD      . sodium chloride flush (NS) 0.9 % injection 3 mL  3 mL Intravenous Q12H Cristescu, Linard Millers, MD   3 mL at 01/05/19 0956  . sodium chloride flush (NS) 0.9 % injection 3 mL  3 mL Intravenous PRN Cristescu, Linard Millers, MD   3 mL at 01/02/19 2243  . vitamin A & D ointment 1 application  1 application Topical Daily PRN Cristescu, Linard Millers, MD   1 application at [phone]     Discharge Medications: Please see discharge summary for a list of discharge medications.  Relevant Imaging Results:  Relevant Lab Results:   Additional Information ss# [place], [place]

## 2019-01-06 ENCOUNTER — Encounter (HOSPITAL_COMMUNITY): Payer: Self-pay

## 2019-01-06 MED ORDER — CLINDAMYCIN HCL 300 MG PO CAPS: 300.0000 mg | ORAL_CAPSULE | Freq: Three times a day (TID) | ORAL | 0 refills | Status: AC

## 2019-01-06 MED ORDER — ACETAMINOPHEN 325 MG PO TABS: 650.0000 mg | ORAL_TABLET | Freq: Four times a day (QID) | ORAL | 0 refills | Status: DC | PRN

## 2019-01-06 MED FILL — CLINDAMYCIN HCL 300 MG CAPS: 300 | 3 days supply | Qty: 9 | Fill #0

## 2019-01-06 NOTE — TOC Progression Note (Signed)
Transition of Care [facility]) - Progression Note    Patient Details  Name: [name] MRN: [id number] Date of Birth: 04/18/1965  Transition of Care [facility]) CM/SW Contact  Zenon Mayo, RN Phone Number: 01/06/2019, 2:40 PM  Clinical Narrative:    Patient would like to go home with [facility] services, NCM made referral to [facility] for RN, PT, and aide.  [facility] was unable to take referral due to staffing.  NCM informed [facility] CSW Dir, he states he will do a LOG for RN for once a week for 2 weeks and pt 3 x /week for 2 weeks.  NCM offered choice to patient from list she states to try another agency.     Expected Discharge Plan: Skilled Nursing Facility Barriers to Discharge: Insurance Authorization, SNF Pending bed offer  Expected Discharge Plan and Services Expected Discharge Plan: [place] arrangements for the past 2 months: Single Family Home Expected Discharge Date: 01/06/19                                     Social Determinants of Health (SDOH) Interventions    Readmission Risk Interventions No flowsheet data found.

## 2019-01-06 NOTE — TOC Progression Note (Signed)
Transition of Care [facility]) - Progression Note    Patient Details  Name: [name] MRN: [id number] Date of Birth: [birth year]  Transition of Care [facility]) CM/SW [place], [place] Phone Number: 01/06/2019, 9:45 AM  Clinical Narrative:     Patient will be an LOG per the she currently does not have insurance. The patient also does not have any bed offers.   CSW will reach out to financial counseling to see if a medicaid application has been started on her behalf.   CSW will continue to follow.   Expected Discharge Plan: Skilled Nursing Facility Barriers to Discharge: Ship broker, SNF Pending bed offer  Expected Discharge Plan and Services Expected Discharge Plan: [place] arrangements for the past 2 months: Single Family Home                                       Social Determinants of Health (SDOH) Interventions    Readmission Risk Interventions No flowsheet data found.

## 2019-01-06 NOTE — TOC Transition Note (Addendum)
Transition of Care [facility]) - CM/SW Discharge Note   Patient Details  Name: [name] MRN: [id number] Date of Birth: [birth year]  Transition of Care [facility]) CM/SW Contact:  Zenon Mayo, RN Phone Number: 01/06/2019, 4:20 PM   Clinical Narrative:    Patient is for dc today, will need HHPT for 3x/week for 2 weeks and HHRN once a week for 2 weeks. These services are for LOG .  Zack approved.  [facility] will take referral under LOG .  Patient can been reached at [phone].    Final next level of care: [place] Barriers to Discharge: No Barriers Identified   Patient Goals and CMS Choice Patient states their goals for this hospitalization and ongoing recovery are:: get better CMS Medicare.gov Compare Post Acute Care list provided to:: Patient Choice offered to / list presented to : Patient  Discharge Placement                       Discharge Plan and Services                DME Arranged: (NA)         HH Arranged: RN, PT [place] Agency: [place] ([facility]) Date [place]: 01/06/19 Time [place]: [phone] Representative spoke with at [place]: [place] ([place]) Interventions     Readmission Risk Interventions No flowsheet data found.

## 2019-01-06 NOTE — Discharge Summary (Signed)
Physician Discharge Summary  [name] [id number] DOB: February 05, 1966 DOA: 12/31/2018  PCP: Gildardo Pounds, NP  Admit date: 12/31/2018 Discharge date: 01/06/2019  Admitted From: Home Disposition:  Home  Discharge Condition:Stable CODE STATUS:FULL Diet recommendation: Heart Healthy   Brief/Interim Summary: Patient is a 53 year old female with history of with history of PE, DVT, morbid obesity, chronic anemia, depression, elephantiasis with bilateral lower extremity lymphedema who presented with shortness of breath, pain on the left leg with associated erythema, increased temperature.  Patient was admitted for the management of cellulitis of left lower extremity.  Started on antibiotics. Left lower extremity cellulitis improved with antibiotics.  PT evaluated her and recommended skilled nursing facility on discharge.SW consulted.  She does not have insurance so discharge to skilled nursing facility not possible. Patient is medically stable for discharge to home with home health today.  Following problems were addressed during her hospitalization:  Left leg cellulitis: Has bilateral lymphedema of lower extremities due to history of elephantiasis.  Left lower extremity noted to be erythematous, warmer.  Ultrasound negative for DVT.  Started on clindamycin.  Left extremity  cellulitis looks significantly improved.  She does not have any erythema on left lower extremity.  No open sores or ulcers.Changed antibiotic to oral.  Chest pain: Complains  of on and off chest pain  starting from the left neck radiating to the left shoulder.  Her chest pain is atypical.  Reproducible.  Troponins negative.  Echocardiogram showed ejection fraction of 65 to 70%.CT angiogram did not show any intrathoracic abnormality.Her respiratory status is stable.Chest pain is most likley musculoskeletal in etiology. EKG did not show any ischemic changes.  Continue tylenol as needed.  History of DVT/PE: On Eliquis .   CTA done here did not show any pulmonary emboli.  She follows with hematology as an outpatient.  Morbid obesity: BMI of 38  Hypokalemia: Supplemented  Deconditioning/debility: Ambulates with the help of walker.  She has decreased mobility due to left knee injury.  She is being evaluated by orthopedic surgery for possible surgical intervention.  She also complains of pain/swelling of her joints elbows and is recommended to follow up with rheumatology as an outpatient but due to her lack of insurance that was not possible.  PT recommended SNF which was not possible due to lack of insurance.  Patient be discharged with home health.     Discharge Diagnoses:  Active Problems:   Abnormal ECG   Physical deconditioning   Cellulitis   Elephantiasis   Ischemic chest pain (HCC)   History of pulmonary embolus (PE)    Discharge Instructions  Discharge Instructions    Diet - low sodium heart healthy   Complete by: As directed    Discharge instructions   Complete by: As directed    1) [facility]  Appointment has been scheduled. 2)Take prescribed medications as instructed.   Increase activity slowly   Complete by: As directed      Allergies as of 01/06/2019      Reactions   Bee Venom Anaphylaxis   Augmentin [amoxicillin-pot Clavulanate] Hives, Itching, Other (See Comments)   Did PCN reaction causing immediate rash, facial/tongue/throat swelling, SOB or lightheadedness with hypotension: yes Did PCN reaction causing severe rash involving mucus membranes or skin necrosis: no Has patient had a PCN reaction that required hospitalization: in hospital Has patient had a PCN reaction occurring within the last 10 years: no If all of the above answers  are "NO", then may proceed with Cephalosporin use. Pt reports no recollection of any reactions when taking penicillin in past   Latex Hives, Other (See Comments)   Local reaction (welts).  Patient denies any wheezing or other reaction with latex exposure   Nulytely [peg 3350-kcl-na Bicarb-nacl]    NAUSEA AND VOMITING. MAY TOLERATE LOW VOLUME PREP.      Medication List    TAKE these medications   acetaminophen 325 MG tablet Commonly known as: TYLENOL Take 2 tablets (650 mg total) by mouth every 6 (six) hours as needed for mild pain (or Fever >/= 101).   amLODipine 5 MG tablet Commonly known as: NORVASC Take 1 tablet (5 mg total) by mouth daily.   apixaban 5 MG Tabs tablet Commonly known as: Eliquis Take 1 tablet (5 mg total) by mouth 2 (two) times daily.   carvedilol 6.25 MG tablet Commonly known as: Coreg Take 1 tablet (6.25 mg total) by mouth 2 (two) times daily with a meal.   clindamycin 300 MG capsule Commonly known as: CLEOCIN Take 1 capsule (300 mg total) by mouth every 8 (eight) hours for 3 days.   DULoxetine 20 MG capsule Commonly known as: CYMBALTA TAKE 1 CAPSULE (20 MG TOTAL) BY MOUTH DAILY.   enoxaparin 150 MG/ML injection Commonly known as: LOVENOX Inject 1 mL into the skin See admin instructions. Only when pt has a procedure.   furosemide 20 MG tablet Commonly known as: Lasix Take 1 tablet (20 mg total) by mouth daily.   vitamin A & D ointment Apply 1 application topically daily as needed for dry skin.      Follow-up Information    Gildardo Pounds, NP. Schedule an appointment as soon as possible for a visit on 02/04/2019.   Specialty: Nurse Practitioner Why: 2:30 for follow up Contact information: [address] [place] [address] [phone]          Allergies  Allergen Reactions  . Bee Venom Anaphylaxis  . Augmentin [Amoxicillin-Pot Clavulanate] Hives, Itching and Other (See Comments)    Did PCN reaction causing immediate rash, facial/tongue/throat swelling, SOB or lightheadedness with hypotension: yes Did PCN reaction causing severe rash involving mucus membranes or skin necrosis: no Has patient had a PCN reaction  that required hospitalization: in hospital Has patient had a PCN reaction occurring within the last 10 years: no If all of the above answers are "NO", then may proceed with Cephalosporin use.  Pt reports no recollection of any reactions when taking penicillin in past  . Latex Hives and Other (See Comments)    Local reaction (welts). Patient denies any wheezing or other reaction with latex exposure  . Nulytely [Peg 3350-Kcl-Na Bicarb-Nacl]     NAUSEA AND VOMITING. MAY TOLERATE LOW VOLUME PREP.    Consultations:  None   Procedures/Studies: Ct Angio Chest Pe W And/or Wo Contrast  Result Date: 12/31/2018 CLINICAL DATA:  Pleuritic and exertional chest pain with shortness of breath EXAM: CT ANGIOGRAPHY CHEST WITH CONTRAST TECHNIQUE: Multidetector CT imaging of the chest was performed using the standard protocol during bolus administration of intravenous contrast. Multiplanar CT image reconstructions and MIPs were obtained to evaluate the vascular anatomy. CONTRAST:  183mL OMNIPAQUE IOHEXOL 350 MG/ML SOLN COMPARISON:  CT chest 05/06/2018 FINDINGS: Cardiovascular: The heart is normal in size. Small pericardial effusion. The aorta is normal in caliber. No dissection. No atherosclerotic calcifications. The branch vessels are patent. Very poor opacification of the pulmonary arteries make the study very limited for pulmonary  embolism. This was the second attempt. The patient could not have any more contrast. I do not see any obvious large pulmonary emboli but patient may need a V/Q scan. Mediastinum/Nodes: No mediastinal or hilar mass or adenopathy. The esophagus is grossly normal. Lungs/Pleura: No acute or significant pulmonary findings. No infiltrates or effusions. No pulmonary edema. No worrisome pulmonary lesions. Upper Abdomen: No significant upper abdominal findings.  A Musculoskeletal: No breast masses are identified. Persistent axillary and subpectoral lymphadenopathy. No significant bony findings.  Review of the MIP images confirms the above findings. IMPRESSION: 1. Very limited study for pulmonary embolism due to poor opacification of the pulmonary arteries despite a second try. No obvious large central pulmonary emboli but patient may need V/Q scan if PE remains clinical concern. 2. Normal thoracic aorta. 3. Small pericardial effusion. 4. No acute pulmonary findings. 5. Stable bilateral axillary and subpectoral lymphadenopathy. Electronically Signed   By: Marijo Sanes M.D.   On: 12/31/2018 18:34   Vas [place] Lower Extremity Venous (dvt) (only [facility])  Result Date: 12/31/2018  Lower Venous Study Indications: Pain, Swelling, and Recent history of DVT.  Limitations: Body habitus, poor ultrasound/tissue interface and depth of vessels. Comparison Study: 12/15/2017- right lower extremity DVT involving the right                   posterior tibial and peroneal veins. Performing Technologist: Maudry Mayhew MHA, RDMS, RVT, RDCS  Examination Guidelines: A complete evaluation includes B-mode imaging, spectral Doppler, color Doppler, and power Doppler as needed of all accessible portions of each vessel. Bilateral testing is considered an integral part of a complete examination. Limited examinations for reoccurring indications may be performed as noted.  +---------+---------------+---------+-----------+----------+--------------+ RIGHT    CompressibilityPhasicitySpontaneityPropertiesThrombus Aging +---------+---------------+---------+-----------+----------+--------------+ CFV      Full           Yes      Yes                                 +---------+---------------+---------+-----------+----------+--------------+ SFJ      Full                                                        +---------+---------------+---------+-----------+----------+--------------+ FV Prox  Full                                                         +---------+---------------+---------+-----------+----------+--------------+ FV Mid   Full                                                        +---------+---------------+---------+-----------+----------+--------------+ FV DistalFull                                                        +---------+---------------+---------+-----------+----------+--------------+  PFV      Full                                                        +---------+---------------+---------+-----------+----------+--------------+ POP      Full           Yes      Yes                                 +---------+---------------+---------+-----------+----------+--------------+ PTV      Full                                                        +---------+---------------+---------+-----------+----------+--------------+  +---------+---------------+---------+-----------+----------+--------------+ LEFT     CompressibilityPhasicitySpontaneityPropertiesThrombus Aging +---------+---------------+---------+-----------+----------+--------------+ CFV      Full           Yes      Yes                                 +---------+---------------+---------+-----------+----------+--------------+ SFJ      Full                                                        +---------+---------------+---------+-----------+----------+--------------+ FV Prox  Full                                                        +---------+---------------+---------+-----------+----------+--------------+ FV Mid   Full                                                        +---------+---------------+---------+-----------+----------+--------------+ FV DistalFull                                                        +---------+---------------+---------+-----------+----------+--------------+ PFV      Full                                                         +---------+---------------+---------+-----------+----------+--------------+ POP      Full           Yes      Yes                                 +---------+---------------+---------+-----------+----------+--------------+  PTV      Full                                                        +---------+---------------+---------+-----------+----------+--------------+  Summary: Right: There is no evidence of deep vein thrombosis in the lower extremity. However, portions of this examination were limited- see technologist comments above. No cystic structure found in the popliteal fossa. Ultrasound characteristics of enlarged lymph nodes are noted in the groin. Left: There is no evidence of deep vein thrombosis in the lower extremity. However, portions of this examination were limited- see technologist comments above. No cystic structure found in the popliteal fossa. Ultrasound characteristics of enlarged lymph  nodes noted in the groin.  Unable to visualize bilateral peroneal veins due to technical limitations. *See table(s) above for measurements and observations. Electronically signed by Harold Barban MD on 12/31/2018 at 5:39:59 PM.    Final        Subjective:  Patient seen and examined at bedside this morning.  He medically stable.  Does not complain of any chest pain or shortness of breath today.  Left lower leg extremity cellulitis looks significantly better.  She is stable for discharge today  Discharge Exam: Vitals:   01/06/19 0841 01/06/19 1219  BP: (!) 146/63 121/71  Pulse: 83 74  Resp:  18  Temp:  98.7 F (37.1 C)  SpO2: 95% 97%   Vitals:   01/06/19 0131 01/06/19 0416 01/06/19 0841 01/06/19 1219  BP:  130/78 (!) 146/63 121/71  Pulse:  79 83 74  Resp:  18  18  Temp:  99.3 F (37.4 C)  98.7 F (37.1 C)  TempSrc:  Oral  Oral  SpO2:  98% 95% 97%  Weight: 123.5 kg     Height:        General: Pt is alert, awake, not in acute distress,morbidly obese Cardiovascular: RRR,  S1/S2 +, no rubs, no gallops Respiratory: CTA bilaterally, no wheezing, no rhonchi Abdominal: Soft, NT, ND, bowel sounds + Extremities: Lateral severe lower extremity lymphedema    The results of significant diagnostics from this hospitalization (including imaging, microbiology, ancillary and laboratory) are listed below for reference.     Microbiology: Recent Results (from the past 240 hour(s))  Urine culture     Status: Abnormal   Collection Time: 12/31/18  2:25 PM   Specimen: Urine, Random  Result Value Ref Range Status   Specimen Description URINE, RANDOM  Final   Special Requests   Final    NONE Performed at [facility], [address], [place], [place] [address]    Culture MULTIPLE SPECIES PRESENT, SUGGEST RECOLLECTION (A)  Final   Report Status 01/01/2019 FINAL  Final  SARS CORONAVIRUS 2 (TAT 6-24 HRS) Nasopharyngeal Nasopharyngeal Swab     Status: None   Collection Time: 12/31/18 11:24 PM   Specimen: Nasopharyngeal Swab  Result Value Ref Range Status   SARS Coronavirus 2 NEGATIVE NEGATIVE Final    Comment: (NOTE) SARS-CoV-2 target nucleic acids are NOT DETECTED. The SARS-CoV-2 RNA is generally detectable in upper and lower respiratory specimens during the acute phase of infection. Negative results do not preclude SARS-CoV-2 infection, do not rule out co-infections with other pathogens, and should not be used as the sole basis for treatment or other patient management decisions. Negative results must be  combined with clinical observations, patient history, and epidemiological information. The expected result is Negative. Fact Sheet for Patients: SugarRoll.be Fact Sheet for Healthcare Providers: https://www.woods-mathews.com/ This test is not yet approved or cleared by the [place] FDA and  has been authorized for detection and/or diagnosis of SARS-CoV-2 by FDA under an Emergency Use Authorization (EUA). This EUA  will remain  in effect (meaning this test can be used) for the duration of the COVID-19 declaration under Section 56 4(b)(1) of the Act, 21 U.S.C. section 360bbb-3(b)(1), unless the authorization is terminated or revoked sooner. Performed at [facility], [place] [address] [place], [place] [address]      Labs: BNP (last 3 results) Recent Labs    12/31/18 1359  BNP 123456   Basic Metabolic Panel: Recent Labs  Lab 12/31/18 1152 01/01/19 0404 01/02/19 0342 01/03/19 0350  NA 141 139 137 135  K 3.7 3.4* 3.3* 3.7  CL 108 107 105 105  CO2 23 25 25 23   GLUCOSE 114* 107* 100* 112*  BUN 10 5* 6 5*  CREATININE 1.03* 0.69 0.78 0.84  CALCIUM 10.1 9.0 8.6* 8.8*   Liver Function Tests: Recent Labs  Lab 12/31/18 1339 01/03/19 0350  AST 17 12*  ALT 9 9  ALKPHOS 56 40  BILITOT 0.8 1.3*  PROT 9.0* 7.3  ALBUMIN 3.8 2.8*   Recent Labs  Lab 12/31/18 1339  LIPASE 29   No results for input(s): AMMONIA in the last 168 hours. CBC: Recent Labs  Lab 12/31/18 1152 01/01/19 0404 01/02/19 0342 01/03/19 0350  WBC 6.3 7.2 6.2 6.9  HGB 11.2* 9.8* 9.4* 10.0*  HCT 37.4 32.3* 30.1* 31.9*  MCV 92.3 90.7 88.3 88.1  PLT 307 275 258 296   Cardiac Enzymes: No results for input(s): CKTOTAL, CKMB, CKMBINDEX, TROPONINI in the last 168 hours. BNP: Invalid input(s): POCBNP CBG: Recent Labs  Lab 12/31/18 1236  GLUCAP 82   D-Dimer No results for input(s): DDIMER in the last 72 hours. Hgb A1c No results for input(s): HGBA1C in the last 72 hours. Lipid Profile No results for input(s): CHOL, HDL, LDLCALC, TRIG, CHOLHDL, LDLDIRECT in the last 72 hours. Thyroid function studies No results for input(s): TSH, T4TOTAL, T3FREE, THYROIDAB in the last 72 hours.  Invalid input(s): FREET3 Anemia work up No results for input(s): VITAMINB12, FOLATE, FERRITIN, TIBC, IRON, RETICCTPCT in the last 72 hours. Urinalysis    Component Value Date/Time   COLORURINE YELLOW 12/31/2018 1425    APPEARANCEUR CLEAR 12/31/2018 1425   LABSPEC 1.023 12/31/2018 1425   PHURINE 5.0 12/31/2018 1425   GLUCOSEU NEGATIVE 12/31/2018 1425   HGBUR SMALL (A) 12/31/2018 1425   BILIRUBINUR NEGATIVE 12/31/2018 1425   BILIRUBINUR small 03/09/2017 1230   KETONESUR NEGATIVE 12/31/2018 1425   PROTEINUR NEGATIVE 12/31/2018 1425   UROBILINOGEN 0.2 03/09/2017 1230   UROBILINOGEN 1.0 08/30/2010 1456   NITRITE NEGATIVE 12/31/2018 1425   LEUKOCYTESUR NEGATIVE 12/31/2018 1425   Sepsis Labs Invalid input(s): PROCALCITONIN,  WBC,  LACTICIDVEN Microbiology Recent Results (from the past 240 hour(s))  Urine culture     Status: Abnormal   Collection Time: 12/31/18  2:25 PM   Specimen: Urine, Random  Result Value Ref Range Status   Specimen Description URINE, RANDOM  Final   Special Requests   Final    NONE Performed at [facility], [place] [address], [place], [place] [address]    Culture MULTIPLE SPECIES PRESENT, SUGGEST RECOLLECTION (A)  Final   Report Status 01/01/2019 FINAL  Final  SARS CORONAVIRUS 2 (TAT 6-24 HRS) Nasopharyngeal Nasopharyngeal Swab     Status: None   Collection Time: 12/31/18 11:24 PM   Specimen: Nasopharyngeal Swab  Result Value Ref Range Status   SARS Coronavirus 2 NEGATIVE NEGATIVE Final    Comment: (NOTE) SARS-CoV-2 target nucleic acids are NOT DETECTED. The SARS-CoV-2 RNA is generally detectable in upper and lower respiratory specimens during the acute phase of infection. Negative results do not preclude SARS-CoV-2 infection, do not rule out co-infections with other pathogens, and should not be used as the sole basis for treatment or other patient management decisions. Negative results must be combined with clinical observations, patient history, and epidemiological information. The expected result is Negative. Fact Sheet for Patients: SugarRoll.be Fact Sheet for Healthcare Providers: https://www.woods-mathews.com/ This test  is not yet approved or cleared by the [place] FDA and  has been authorized for detection and/or diagnosis of SARS-CoV-2 by FDA under an Emergency Use Authorization (EUA). This EUA will remain  in effect (meaning this test can be used) for the duration of the COVID-19 declaration under Section 56 4(b)(1) of the Act, 21 U.S.C. section 360bbb-3(b)(1), unless the authorization is terminated or revoked sooner. Performed at [facility], [place] [address], [place], [place] [address]     Please note: You were cared for by a hospitalist during your hospital stay. Once you are discharged, your primary care physician will handle any further medical issues. Please note that NO REFILLS for any discharge medications will be authorized once you are discharged, as it is imperative that you return to your primary care physician (or establish a relationship with a primary care physician if you do not have one) for your post hospital discharge needs so that they can reassess your need for medications and monitor your lab values.    Time coordinating discharge: 40 minutes  SIGNED:   Shelly Coss, MD  [facility] 01/06/2019, 1:57 PM Pager [phone]  If 7PM-7AM, please contact night-coverage [email or web address] Password [email or web address]

## 2019-01-10 ENCOUNTER — Telehealth: Payer: Self-pay | Admitting: Nurse Practitioner

## 2019-01-10 NOTE — Telephone Encounter (Signed)
Nurse called to stat that patient moved her PT back to Monday instead of this week

## 2019-01-12 NOTE — Telephone Encounter (Signed)
NOTED. Approved

## 2019-01-15 ENCOUNTER — Telehealth: Payer: Self-pay | Admitting: Nurse Practitioner

## 2019-01-15 NOTE — Telephone Encounter (Signed)
Verbal orders [facility]

## 2019-01-15 NOTE — Telephone Encounter (Signed)
Home Health called to get verbal approval for nursing 1x week for 3 week.  Verbal orders approved.

## 2019-01-17 ENCOUNTER — Telehealth: Payer: Self-pay | Admitting: Nurse Practitioner

## 2019-01-17 NOTE — Telephone Encounter (Signed)
Kelly form [facility] called requesting verbal orders for home PT 1 week 4 and home health aid 1 week 4. Please f/u

## 2019-01-17 NOTE — Telephone Encounter (Signed)
Attempt to call Claiborne Billings back and verbal approve the orders.  No answer and LVM.

## 2019-01-27 MED FILL — $ELIQUIS 5 MG TABLET: 5 | 30 days supply | Qty: 60 | Fill #1

## 2019-02-04 ENCOUNTER — Ambulatory Visit: Payer: Self-pay | Attending: Nurse Practitioner | Admitting: Nurse Practitioner

## 2019-02-04 ENCOUNTER — Other Ambulatory Visit: Payer: Self-pay

## 2019-02-04 ENCOUNTER — Encounter: Payer: Self-pay | Admitting: Nurse Practitioner

## 2019-02-04 DIAGNOSIS — L03116 Cellulitis of left lower limb: Secondary | ICD-10-CM

## 2019-02-04 DIAGNOSIS — Z09 Encounter for follow-up examination after completed treatment for conditions other than malignant neoplasm: Secondary | ICD-10-CM

## 2019-02-04 DIAGNOSIS — R030 Elevated blood-pressure reading, without diagnosis of hypertension: Secondary | ICD-10-CM

## 2019-02-04 NOTE — Progress Notes (Signed)
Virtual Visit via Telephone Note Due to national recommendations of social distancing due to [place] 19, telehealth visit is felt to be most appropriate for this patient at this time.  I discussed the limitations, risks, security and privacy concerns of performing an evaluation and management service by telephone and the availability of in person appointments. I also discussed with the patient that there may be a patient responsible charge related to this service. The patient expressed understanding and agreed to proceed.    I connected with [name] on 02/04/19  at   2:30 PM EST  EDT by telephone and verified that I am speaking with the correct person using two identifiers.   Consent I discussed the limitations, risks, security and privacy concerns of performing an evaluation and management service by telephone and the availability of in person appointments. I also discussed with the patient that there may be a patient responsible charge related to this service. The patient expressed understanding and agreed to proceed.   Location of Patient: Private Residence   Location of Provider: [place] and [employer] Office    Persons participating in Telemedicine visit: Lauren Rankins FNP-BC [place]    History of Present Illness: Telemedicine visit for: HFU  has a past medical history of Anemia, Anxiety, Asthma, Blood transfusion (01/22/11), Depression (08/2010), Dyspnea, Fibroid, Headache(784.0), Keloid, and Pulmonary embolism ([place]).  Admitted to hospital 11-3 and Dc'd 11-9 after being treated for LLE cellulitis. PT reccommended SNF  upon discharge however due to her uninsured status she could not afford a SNF stay and was discharged home with [facility]. Hospital course was complicated as [place] also has severe BLE lymphedema.    Today reports doing well. She has a walker and a wheelchair but mostly using wheelchair for mobility.She states she was just  discharged from [facility] services today which makes a total of 3-4 weeks that she received PT.    She has a blood pressure cuff. But has not been monitoring her blood pressure at home.  BP Readings from Last 3 Encounters:  01/06/19 133/70  08/27/18 133/75  08/06/18 (!) 148/64     Past Medical History:  Diagnosis Date  . Anemia   . Anxiety   . Asthma    hx as child - no prob as adult - no inhaler  . Blood transfusion 01/22/11   transfusion 2 units at [facility]  . Depression 08/2010   psych assessment  . Dyspnea   . Fibroid   . Headache(784.0)    rx for imitrex - last one jan  . Keloid   . Pulmonary embolism [facility])     Past Surgical History:  Procedure Laterality Date  . ABDOMINAL HYSTERECTOMY    . COLONOSCOPY N/A 11/08/2017   Procedure: COLONOSCOPY;  Surgeon: Danie Binder, MD;  [facility];  Service: Endoscopy;  Laterality: N/A;  2:00pm  . FLEXIBLE SIGMOIDOSCOPY N/A 09/17/2017   Procedure: FLEXIBLE SIGMOIDOSCOPY;  Surgeon: Danie Binder, MD;  [facility];  Service: Endoscopy;  Laterality: N/A;  . HERNIA REPAIR  [id number]   umbilical  . POLYPECTOMY  11/08/2017   Procedure: POLYPECTOMY;  Surgeon: Danie Binder, MD;  [facility];  Service: Endoscopy;;  ascending colon (CSx1), transverse colon (CS x1), splenic flexure (HSx1)  . SVD     Spontaneous vaginal delivery; x 1    Family History  Problem Relation Age of Onset  . Heart disease Mother   . Hypertension Mother   .  Hypertension Father   . Colon cancer Father 7       Passed away 46 yrs old  . Hypertension Sister   . Cancer Maternal Grandmother        gastric cancer  . Cancer Maternal Grandfather        pancreatic cancer  . Colon cancer Paternal Grandmother   . Colon cancer Paternal Uncle   . Colon polyps Neg Hx     Social History   Socioeconomic History  . Marital status: Single    Spouse name: Not on file  . Number of children: 1  . Years of education: Not on file  . Highest  education level: Not on file  Occupational History  . Occupation: medical billing    Employer: [employer]  Tobacco Use  . Smoking status: Never Smoker  . Smokeless tobacco: Never Used  Substance and Sexual Activity  . Alcohol use: No  . Drug use: No  . Sexual activity: Not Currently    Birth control/protection: None, Surgical  Other Topics Concern  . Not on file  Social History Narrative  . Not on file   Social Determinants of Health   Financial Resource Strain:   . Difficulty of Paying Living Expenses: Not on file  Food Insecurity:   . Worried About Charity fundraiser in the Last Year: Not on file  . Ran Out of Food in the Last Year: Not on file  Transportation Needs:   . Lack of Transportation (Medical): Not on file  . Lack of Transportation (Non-Medical): Not on file  Physical Activity: Sufficiently Active  . Days of Exercise per Week: 7 days  . Minutes of Exercise per Session: 30 min  Stress: No Stress Concern Present  . Feeling of Stress : Only a little  Social Connections: Somewhat Isolated  . Frequency of Communication with Friends and Family: More than three times a week  . Frequency of Social Gatherings with Friends and Family: Twice a week  . Attends Religious Services: More than 4 times per year  . Active Member of Clubs or Organizations: No  . Attends Archivist Meetings: Never  . Marital Status: Never married     Observations/Objective: Awake, alert and oriented x 3   ROS  Assessment and Plan: [name] was seen today for hospitalization follow-up.  Diagnoses and all orders for this visit:  Hospital discharge follow-up Continue all medications as prescribed   Follow Up Instructions Return in about 2 months (around 04/07/2019).     I discussed the assessment and treatment plan with the patient. The patient was provided an opportunity to ask questions and all were answered. The patient agreed with the plan and demonstrated an understanding of the  instructions.   The patient was advised to call back or seek an in-person evaluation if the symptoms worsen or if the condition fails to improve as anticipated.  I provided 18 minutes of non-face-to-face time during this encounter including median intraservice time, reviewing previous notes, labs, imaging, medications and explaining diagnosis and management.  Gildardo Pounds, FNP-BC

## 2019-02-08 ENCOUNTER — Encounter: Payer: Self-pay | Admitting: Nurse Practitioner

## 2019-02-19 ENCOUNTER — Other Ambulatory Visit: Payer: Self-pay | Admitting: Pharmacist

## 2019-02-19 MED ORDER — CARVEDILOL 6.25 MG PO TABS: 6.2500 mg | ORAL_TABLET | Freq: Two times a day (BID) | ORAL | 2 refills | Status: DC

## 2019-02-19 MED FILL — CARVEDILOL 6.25 MG TABLET: 6.25 | 30 days supply | Qty: 60 | Fill #0

## 2019-03-12 ENCOUNTER — Other Ambulatory Visit: Payer: Self-pay | Admitting: Hematology

## 2019-03-12 MED FILL — $ELIQUIS 5 MG TABLET: 5 | 30 days supply | Qty: 60 | Fill #2

## 2019-03-14 MED FILL — CARVEDILOL 6.25 MG TABLET: 6.25 | 30 days supply | Qty: 60 | Fill #0

## 2019-04-02 ENCOUNTER — Telehealth (HOSPITAL_BASED_OUTPATIENT_CLINIC_OR_DEPARTMENT_OTHER): Payer: Medicare HMO | Admitting: Nurse Practitioner

## 2019-04-02 DIAGNOSIS — M0580 Other rheumatoid arthritis with rheumatoid factor of unspecified site: Secondary | ICD-10-CM | POA: Diagnosis not present

## 2019-04-02 NOTE — Progress Notes (Signed)
Virtual Visit via Telephone Note Due to national recommendations of social distancing due to [place] 19, telehealth visit is felt to be most appropriate for this patient at this time.  I discussed the limitations, risks, security and privacy concerns of performing an evaluation and management service by telephone and the availability of in person appointments. I also discussed with the patient that there may be a patient responsible charge related to this service. The patient expressed understanding and agreed to proceed.    I connected with [name] on 04/05/19  at  10:10 AM EST  EDT by telephone and verified that I am speaking with the correct person using two identifiers.   Consent I discussed the limitations, risks, security and privacy concerns of performing an evaluation and management service by telephone and the availability of in person appointments. I also discussed with the patient that there may be a patient responsible charge related to this service. The patient expressed understanding and agreed to proceed.   Location of Patient: Private Residence    Location of Provider: [place] and [employer] Office    Persons participating in Telemedicine visit: Lauren Rankins FNP-BC [place]    History of Present Illness: Telemedicine visit for: Follow up  has a past medical history of Anemia, Anxiety, Asthma, Blood transfusion (01/22/11), Depression (08/2010), Dyspnea, Fibroid, Headache(784.0), Keloid, and Pulmonary embolism ([place]).  Polyarthralgia She has a positive RF and endorses swelling and pain in multiple joints including her bilateral hands, wrists, elbows and shoulders.  Will need referral to Rheumatology. Will increase Cymbalta and also try her on a prednisone taper.  Lab Results  Component Value Date   RF 253 (H) 08/27/2018       Past Medical History:  Diagnosis Date  . Anemia   . Anxiety   . Asthma    hx as child - no prob  as adult - no inhaler  . Blood transfusion 01/22/11   transfusion 2 units at [facility]  . Depression 08/2010   psych assessment  . Dyspnea   . Fibroid   . Headache(784.0)    rx for imitrex - last one jan  . Keloid   . Pulmonary embolism [facility])     Past Surgical History:  Procedure Laterality Date  . ABDOMINAL HYSTERECTOMY    . COLONOSCOPY N/A 11/08/2017   Procedure: COLONOSCOPY;  Surgeon: Danie Binder, MD;  [facility];  Service: Endoscopy;  Laterality: N/A;  2:00pm  . FLEXIBLE SIGMOIDOSCOPY N/A 09/17/2017   Procedure: FLEXIBLE SIGMOIDOSCOPY;  Surgeon: Danie Binder, MD;  [facility];  Service: Endoscopy;  Laterality: N/A;  . HERNIA REPAIR  [id number]   umbilical  . POLYPECTOMY  11/08/2017   Procedure: POLYPECTOMY;  Surgeon: Danie Binder, MD;  [facility];  Service: Endoscopy;;  ascending colon (CSx1), transverse colon (CS x1), splenic flexure (HSx1)  . SVD     Spontaneous vaginal delivery; x 1    Family History  Problem Relation Age of Onset  . Heart disease Mother   . Hypertension Mother   . Hypertension Father   . Colon cancer Father 53       Passed away 49 yrs old  . Hypertension Sister   . Cancer Maternal Grandmother        gastric cancer  . Cancer Maternal Grandfather        pancreatic cancer  . Colon cancer Paternal Grandmother   . Colon cancer Paternal Uncle   .  Colon polyps Neg Hx     Social History   Socioeconomic History  . Marital status: Single    Spouse name: Not on file  . Number of children: 1  . Years of education: Not on file  . Highest education level: Not on file  Occupational History  . Occupation: medical billing    Employer: [employer]  Tobacco Use  . Smoking status: Never Smoker  . Smokeless tobacco: Never Used  Substance and Sexual Activity  . Alcohol use: No  . Drug use: No  . Sexual activity: Not Currently    Birth control/protection: None, Surgical  Other Topics Concern  . Not on file  Social History  Narrative  . Not on file   Social Determinants of Health   Financial Resource Strain:   . Difficulty of Paying Living Expenses: Not on file  Food Insecurity:   . Worried About Charity fundraiser in the Last Year: Not on file  . Ran Out of Food in the Last Year: Not on file  Transportation Needs:   . Lack of Transportation (Medical): Not on file  . Lack of Transportation (Non-Medical): Not on file  Physical Activity:   . Days of Exercise per Week: Not on file  . Minutes of Exercise per Session: Not on file  Stress:   . Feeling of Stress : Not on file  Social Connections:   . Frequency of Communication with Friends and Family: Not on file  . Frequency of Social Gatherings with Friends and Family: Not on file  . Attends Religious Services: Not on file  . Active Member of Clubs or Organizations: Not on file  . Attends Archivist Meetings: Not on file  . Marital Status: Not on file     Observations/Objective: Awake, alert and oriented x 3   Review of Systems  Constitutional: Negative for fever, malaise/fatigue and weight loss.  HENT: Negative.  Negative for nosebleeds.   Eyes: Negative.  Negative for blurred vision, double vision and photophobia.  Respiratory: Negative.  Negative for cough and shortness of breath.   Cardiovascular: Negative.  Negative for chest pain, palpitations and leg swelling.  Gastrointestinal: Negative.  Negative for heartburn, nausea and vomiting.  Musculoskeletal: Positive for joint pain. Negative for myalgias.  Neurological: Negative.  Negative for dizziness, focal weakness, seizures and headaches.  Psychiatric/Behavioral: Positive for depression. Negative for suicidal ideas.    Assessment and Plan: Diagnoses and all orders for this visit:  Polyarthritis with positive rheumatoid factor ([place]) -     Ambulatory referral to Rheumatology -     predniSONE (DELTASONE) 20 MG tablet; Take 2 tablets (40 mg total) by mouth daily with breakfast for 7  days. -     DULoxetine (CYMBALTA) 60 MG capsule; Take 1 capsule (60 mg total) by mouth daily.      Follow Up Instructions Return in about 3 months (around 06/30/2019).     I discussed the assessment and treatment plan with the patient. The patient was provided an opportunity to ask questions and all were answered. The patient agreed with the plan and demonstrated an understanding of the instructions.   The patient was advised to call back or seek an in-person evaluation if the symptoms worsen or if the condition fails to improve as anticipated.  I provided 17 minutes of non-face-to-face time during this encounter including median intraservice time, reviewing previous notes, labs, imaging, medications and explaining diagnosis and management.  Gildardo Pounds, FNP-BC

## 2019-04-04 ENCOUNTER — Encounter: Payer: Self-pay | Admitting: Nurse Practitioner

## 2019-04-05 ENCOUNTER — Encounter: Payer: Self-pay | Admitting: Nurse Practitioner

## 2019-04-05 MED ORDER — PREDNISONE 10 MG PO TABS: ORAL_TABLET | ORAL | 0 refills | Status: DC

## 2019-04-05 MED ORDER — DULOXETINE HCL 60 MG PO CPEP: 60.0000 mg | ORAL_CAPSULE | Freq: Every day | ORAL | 0 refills | Status: DC

## 2019-04-05 MED ORDER — PREDNISONE 20 MG PO TABS: 40.0000 mg | ORAL_TABLET | Freq: Every day | ORAL | 0 refills | Status: AC

## 2019-04-07 MED FILL — DULoxetine HCL 60 MG CPEP: 60 | 30 days supply | Qty: 30 | Fill #0

## 2019-04-25 MED FILL — CARVEDILOL 6.25 MG TABLET: 6.25 | 15 days supply | Qty: 30 | Fill #1

## 2019-05-05 MED FILL — $ELIQUIS 5 MG TABLET: 5 | 30 days supply | Qty: 60 | Fill #0

## 2019-05-15 ENCOUNTER — Telehealth: Payer: Self-pay | Admitting: Nurse Practitioner

## 2019-05-15 MED FILL — CARVEDILOL 6.25 MG TABLET: 6.25 | 15 days supply | Qty: 30 | Fill #1

## 2019-05-15 NOTE — Telephone Encounter (Signed)
Patient is calling on status of referral. Please call patient.

## 2019-05-16 NOTE — Telephone Encounter (Signed)
I spoke to patient she has [employer]  And she wants me to send referral to  Dr Gavin Pound at  [facility] .  Sent Referral to [facility] Ph# [phone]  Address [phone]. They will contact the patient to schedule an appointment.  Sent Referral and Notes in [place] (RMS) PROGRAM

## 2019-06-04 ENCOUNTER — Telehealth: Payer: Self-pay | Admitting: Nurse Practitioner

## 2019-06-04 NOTE — Telephone Encounter (Signed)
Can you please, help me up  [facility] is requesting   Labs  The referral states it is for positive RF  (I don't know which one is)  so please include that as well.  Please fax patients lab results to [phone]. Our physicians require all information prior to scheduling. Thank you. "

## 2019-06-06 NOTE — Telephone Encounter (Signed)
Faxed over lab results to [facility].

## 2019-06-10 DIAGNOSIS — Z6839 Body mass index (BMI) 39.0-39.9, adult: Secondary | ICD-10-CM | POA: Diagnosis not present

## 2019-06-10 DIAGNOSIS — M2550 Pain in unspecified joint: Secondary | ICD-10-CM | POA: Diagnosis not present

## 2019-06-10 DIAGNOSIS — R5382 Chronic fatigue, unspecified: Secondary | ICD-10-CM | POA: Diagnosis not present

## 2019-06-10 DIAGNOSIS — E669 Obesity, unspecified: Secondary | ICD-10-CM | POA: Diagnosis not present

## 2019-06-10 MED FILL — predniSONE 10 MG TABS: 10 | 10 days supply | Qty: 60 | Fill #0

## 2019-06-18 ENCOUNTER — Other Ambulatory Visit: Payer: Self-pay | Admitting: Family Medicine

## 2019-06-18 MED FILL — ELIQUIS 5 MG TABLET: 5 | 30 days supply | Qty: 60 | Fill #1

## 2019-06-19 MED FILL — CARVEDILOL 6.25 MG TABLET: 6.25 | 15 days supply | Qty: 30 | Fill #0

## 2019-06-19 MED FILL — predniSONE 10 MG TABS: 10 | 13 days supply | Qty: 78 | Fill #0

## 2019-07-02 DIAGNOSIS — I890 Lymphedema, not elsewhere classified: Secondary | ICD-10-CM | POA: Diagnosis not present

## 2019-07-02 DIAGNOSIS — Z6841 Body Mass Index (BMI) 40.0 and over, adult: Secondary | ICD-10-CM | POA: Diagnosis not present

## 2019-07-02 DIAGNOSIS — M0579 Rheumatoid arthritis with rheumatoid factor of multiple sites without organ or systems involvement: Secondary | ICD-10-CM | POA: Diagnosis not present

## 2019-07-02 DIAGNOSIS — M2550 Pain in unspecified joint: Secondary | ICD-10-CM | POA: Diagnosis not present

## 2019-07-02 DIAGNOSIS — D6861 Antiphospholipid syndrome: Secondary | ICD-10-CM | POA: Diagnosis not present

## 2019-07-02 DIAGNOSIS — E559 Vitamin D deficiency, unspecified: Secondary | ICD-10-CM | POA: Diagnosis not present

## 2019-07-02 DIAGNOSIS — R5382 Chronic fatigue, unspecified: Secondary | ICD-10-CM | POA: Diagnosis not present

## 2019-07-02 MED FILL — VIT D2 1.25 MG (50,000 UNIT: 1.25 MG | 30 days supply | Qty: 4 | Fill #0

## 2019-07-02 MED FILL — predniSONE 5 MG TABS: 5 | 30 days supply | Qty: 180 | Fill #0

## 2019-07-02 MED FILL — FOLIC ACID 1 MG TABS: 1 | 90 days supply | Qty: 90 | Fill #0

## 2019-07-03 DIAGNOSIS — R69 Illness, unspecified: Secondary | ICD-10-CM | POA: Diagnosis not present

## 2019-08-01 MED FILL — CARVEDILOL 6.25 MG TABLET: 6.25 | 30 days supply | Qty: 60 | Fill #1

## 2019-08-01 MED FILL — ELIQUIS 5 MG TABLET: 5 | 30 days supply | Qty: 60 | Fill #2

## 2019-08-27 DIAGNOSIS — R5382 Chronic fatigue, unspecified: Secondary | ICD-10-CM | POA: Diagnosis not present

## 2019-08-27 DIAGNOSIS — I890 Lymphedema, not elsewhere classified: Secondary | ICD-10-CM | POA: Diagnosis not present

## 2019-08-27 DIAGNOSIS — M2550 Pain in unspecified joint: Secondary | ICD-10-CM | POA: Diagnosis not present

## 2019-08-27 DIAGNOSIS — M0579 Rheumatoid arthritis with rheumatoid factor of multiple sites without organ or systems involvement: Secondary | ICD-10-CM | POA: Diagnosis not present

## 2019-08-27 DIAGNOSIS — Z6841 Body Mass Index (BMI) 40.0 and over, adult: Secondary | ICD-10-CM | POA: Diagnosis not present

## 2019-08-27 DIAGNOSIS — E559 Vitamin D deficiency, unspecified: Secondary | ICD-10-CM | POA: Diagnosis not present

## 2019-08-27 DIAGNOSIS — D6861 Antiphospholipid syndrome: Secondary | ICD-10-CM | POA: Diagnosis not present

## 2019-09-06 ENCOUNTER — Encounter (HOSPITAL_COMMUNITY): Payer: Self-pay | Admitting: Emergency Medicine

## 2019-09-06 ENCOUNTER — Other Ambulatory Visit: Payer: Self-pay

## 2019-09-06 ENCOUNTER — Emergency Department (HOSPITAL_COMMUNITY)
Admission: EM | Admit: 2019-09-06 | Discharge: 2019-09-06 | Disposition: A | Discharge status: Home / Self Care | Payer: Medicare HMO | Attending: Emergency Medicine | Admitting: Emergency Medicine

## 2019-09-06 DIAGNOSIS — K625 Hemorrhage of anus and rectum: Secondary | ICD-10-CM | POA: Insufficient documentation

## 2019-09-06 DIAGNOSIS — Z91040 Latex allergy status: Secondary | ICD-10-CM | POA: Diagnosis not present

## 2019-09-06 DIAGNOSIS — J45909 Unspecified asthma, uncomplicated: Secondary | ICD-10-CM | POA: Insufficient documentation

## 2019-09-06 HISTORY — DX: Unspecified osteoarthritis, unspecified site: M19.90

## 2019-09-06 HISTORY — DX: Lymphedema, not elsewhere classified: I89.0

## 2019-09-06 LAB — CBC
HCT: 38.7 % (ref 36.0–46.0)
Hemoglobin: 12 g/dL (ref 12.0–15.0)
MCH: 28.7 pg (ref 26.0–34.0)
MCHC: 31 g/dL (ref 30.0–36.0)
MCV: 92.6 fL (ref 80.0–100.0)
Platelets: 307 10*3/uL (ref 150–400)
RBC: 4.18 MIL/uL (ref 3.87–5.11)
RDW: 14.6 % (ref 11.5–15.5)
WBC: 5.3 10*3/uL (ref 4.0–10.5)
nRBC: 0 % (ref 0.0–0.2)

## 2019-09-06 LAB — COMPREHENSIVE METABOLIC PANEL
ALT: 10 U/L (ref 0–44)
AST: 14 U/L — ABNORMAL LOW (ref 15–41)
Albumin: 4.1 g/dL (ref 3.5–5.0)
Alkaline Phosphatase: 62 U/L (ref 38–126)
Anion gap: 9 (ref 5–15)
BUN: 12 mg/dL (ref 6–20)
CO2: 24 mmol/L (ref 22–32)
Calcium: 9.8 mg/dL (ref 8.9–10.3)
Chloride: 108 mmol/L (ref 98–111)
Creatinine, Ser: 0.73 mg/dL (ref 0.44–1.00)
GFR calc Af Amer: >60 mL/min (ref >60)
GFR calc non Af Amer: >60 mL/min (ref >60)
Glucose, Bld: 110 mg/dL — ABNORMAL HIGH (ref 70–99)
Potassium: 3.7 mmol/L (ref 3.5–5.1)
Sodium: 141 mmol/L (ref 135–145)
Total Bilirubin: 0.7 mg/dL (ref 0.3–1.2)
Total Protein: 8 g/dL (ref 6.5–8.1)

## 2019-09-06 LAB — TYPE AND SCREEN
ABO/RH(D): A POS
Antibody Screen: NEGATIVE

## 2019-09-06 NOTE — Discharge Instructions (Addendum)
Return here if your bleeding gets worse

## 2019-09-06 NOTE — ED Triage Notes (Signed)
On Eliquis reports rectal bleeding that started today. Reports is incontinent so wears pads. Having some abd pains with nausea.

## 2019-09-06 NOTE — ED Provider Notes (Signed)
[facility] DEPT Provider Note   CSN: [id number] Arrival date & time: 09/06/19  1802     History Chief Complaint  Patient presents with  . Abdominal Pain  . Rectal Bleeding    [name] is a 54 y.o. female.  54 year old female presents with rectal bleeding which started x1 day.  States that she said no rectal pain.  No abdominal discomfort.  No hematemesis.  She does take Eliquis.  States that she was to bathroom noted some blood on the tissue paper.  Looked down and saw some blood in the commode.  Has had a colonoscopy for the past and had polyps removed.  Denies any history of colon cancer.  No weakness.        Past Medical History:  Diagnosis Date  . Anemia   . Anxiety   . Arthritis   . Asthma    hx as child - no prob as adult - no inhaler  . Blood transfusion 01/22/11   transfusion 2 units at [facility]  . Depression 08/2010   psych assessment  . Dyspnea   . Fibroid   . Headache(784.0)    rx for imitrex - last one jan  . Keloid   . Lymphedema   . Pulmonary embolism [facility])     Patient Active Problem List   Diagnosis Date Noted  . Cellulitis 12/31/2018  . Elephantiasis 12/31/2018  . Ischemic chest pain ([place]) 12/31/2018  . History of pulmonary embolus ([place]) 12/31/2018  . Physical deconditioning 08/27/2018  . Adenopathy 09/15/2017  . Family history of colon cancer 08/01/2017  . Taking multiple medications for chronic disease 08/01/2017  . Arthritis   . Axillary lymphadenopathy 01/26/2017  . Urinary incontinence 01/25/2017  . Hypokalemia 01/25/2017  . Lobar pneumonia ([place]) 01/25/2017  . Pressure ulcer of sacral region, stage 2 ([place]) 01/25/2017  . Abnormal ECG 01/29/2013  . Dyspnea   . Menorrhagia with regular cycle 02/22/2011  . Anemia 02/22/2011    Past Surgical History:  Procedure Laterality Date  . ABDOMINAL HYSTERECTOMY    . COLONOSCOPY N/A 11/08/2017   Procedure: COLONOSCOPY;  Surgeon: Danie Binder, MD;  [facility];  Service: Endoscopy;  Laterality: N/A;  2:00pm  . FLEXIBLE SIGMOIDOSCOPY N/A 09/17/2017   Procedure: FLEXIBLE SIGMOIDOSCOPY;  Surgeon: Danie Binder, MD;  [facility];  Service: Endoscopy;  Laterality: N/A;  . HERNIA REPAIR  [id number]   umbilical  . POLYPECTOMY  11/08/2017   Procedure: POLYPECTOMY;  Surgeon: Danie Binder, MD;  [facility];  Service: Endoscopy;;  ascending colon (CSx1), transverse colon (CS x1), splenic flexure (HSx1)  . SVD     Spontaneous vaginal delivery; x 1     OB History    Gravida  3   Para  1   Term  1   Preterm      AB  2   Living  1     SAB  1   TAB  1   Ectopic      Multiple      Live Births              Family History  Problem Relation Age of Onset  . Heart disease Mother   . Hypertension Mother   . Hypertension Father   . Colon cancer Father 5       Passed away 41 yrs old  . Hypertension Sister   . Cancer Maternal Grandmother  gastric cancer  . Cancer Maternal Grandfather        pancreatic cancer  . Colon cancer Paternal Grandmother   . Colon cancer Paternal Uncle   . Colon polyps Neg Hx     Social History   Tobacco Use  . Smoking status: Never Smoker  . Smokeless tobacco: Never Used  Vaping Use  . Vaping Use: Never used  Substance Use Topics  . Alcohol use: No  . Drug use: No    Home Medications Prior to Admission medications   Medication Sig Start Date End Date Taking? Authorizing Provider  acetaminophen (TYLENOL) 325 MG tablet Take 2 tablets (650 mg total) by mouth every 6 (six) hours as needed for mild pain (or Fever >/= 101). 01/06/19   Shelly Coss, MD  carvedilol (COREG) 6.25 MG tablet TAKE 1 TABLET (6.25 MG TOTAL) BY MOUTH 2 (TWO) TIMES DAILY WITH A MEAL. 06/19/19   Charlott Rakes, MD  DULoxetine (CYMBALTA) 60 MG capsule Take 1 capsule (60 mg total) by mouth daily. 04/05/19 07/04/19  Gildardo Pounds, NP  ELIQUIS 5 MG TABS tablet TAKE 1 TABLET (5 MG TOTAL) BY MOUTH  2 (TWO) TIMES DAILY. 03/14/19   Truitt Merle, MD  Vitamins A & D (VITAMIN A & D) ointment Apply 1 application topically daily as needed for dry skin.    [provider]    Allergies    Bee venom, Augmentin [amoxicillin-pot clavulanate], Latex, and Nulytely [peg 6568-LEX-[id number] bicarb-nacl]  Review of Systems   Review of Systems  All other systems reviewed and are negative.   Physical Exam Updated Vital Signs BP (!) 165/100 (BP Location: Left Arm)   Pulse (!) 102   Temp 98.5 F (36.9 C) (Oral)   Resp 18   LMP 01/31/2011   SpO2 97%   Physical Exam Vitals and nursing note reviewed.  Constitutional:      General: She is not in acute distress.    Appearance: Normal appearance. She is well-developed. She is not toxic-appearing.  HENT:     Head: Normocephalic and atraumatic.  Eyes:     General: Lids are normal.     Conjunctiva/sclera: Conjunctivae normal.     Pupils: Pupils are equal, round, and reactive to light.  Neck:     Thyroid: No thyroid mass.     Trachea: No tracheal deviation.  Cardiovascular:     Rate and Rhythm: Normal rate and regular rhythm.     Heart sounds: Normal heart sounds. No murmur heard.  No gallop.   Pulmonary:     Effort: Pulmonary effort is normal. No respiratory distress.     Breath sounds: Normal breath sounds. No stridor. No decreased breath sounds, wheezing, rhonchi or rales.  Abdominal:     General: Bowel sounds are normal. There is no distension.     Palpations: Abdomen is soft.     Tenderness: There is no abdominal tenderness. There is no rebound.  Genitourinary:    Comments: Brown stool noted tiny amounts of blood.  No hematochezia. Musculoskeletal:        General: No tenderness. Normal range of motion.     Cervical back: Normal range of motion and neck supple.  Skin:    General: Skin is warm and dry.     Findings: No abrasion or rash.  Neurological:     Mental Status: She is alert and oriented to person, place, and time.     GCS:  GCS eye subscore is 4. GCS verbal subscore is 5.  GCS motor subscore is 6.     Cranial Nerves: No cranial nerve deficit.     Sensory: No sensory deficit.  Psychiatric:        Speech: Speech normal.        Behavior: Behavior normal.     ED Results / Procedures / Treatments   Labs (all labs ordered are listed, but only abnormal results are displayed) Labs Reviewed  COMPREHENSIVE METABOLIC PANEL - Abnormal; Notable for the following components:      Result Value   Glucose, Bld 110 (*)    AST 14 (*)    All other components within normal limits  CBC  POC OCCULT BLOOD, ED  TYPE AND SCREEN    EKG None  Radiology No results found.  Procedures Procedures (including critical care time)  Medications Ordered in ED Medications - No data to display  ED Course  I have reviewed the triage vital signs and the nursing notes.  Pertinent labs & imaging results that were available during my care of the patient were reviewed by me and considered in my medical decision making (see chart for details).    MDM Rules/Calculators/A&P                          Patient had a stool here and there was no signs of blood. Hemoglobin stable. Suspect this may have had anal fissure that bled. Return precautions given Final Clinical Impression(s) / ED Diagnoses Final diagnoses:  None    Rx / DC Orders ED Discharge Orders    None       Lacretia Leigh, MD 09/06/19 2215

## 2019-09-16 ENCOUNTER — Other Ambulatory Visit: Payer: Self-pay | Admitting: Hematology

## 2019-09-16 ENCOUNTER — Other Ambulatory Visit: Payer: Self-pay | Admitting: Family Medicine

## 2019-09-16 MED FILL — CARVEDILOL 6.25 MG TABLET: 6.25 | 90 days supply | Qty: 180 | Fill #0

## 2019-09-16 NOTE — Telephone Encounter (Signed)
Please see refill request.

## 2019-09-19 ENCOUNTER — Other Ambulatory Visit: Payer: Self-pay | Admitting: Pharmacist

## 2019-09-19 MED ORDER — APIXABAN 5 MG PO TABS: ORAL_TABLET | ORAL | 0 refills | Status: DC

## 2019-09-19 MED FILL — ELIQUIS 5 MG TABLET: 5 | 30 days supply | Qty: 60 | Fill #0

## 2019-10-09 ENCOUNTER — Other Ambulatory Visit: Payer: Self-pay | Admitting: Physician Assistant

## 2019-10-09 DIAGNOSIS — I890 Lymphedema, not elsewhere classified: Secondary | ICD-10-CM | POA: Diagnosis not present

## 2019-10-09 DIAGNOSIS — M2550 Pain in unspecified joint: Secondary | ICD-10-CM | POA: Diagnosis not present

## 2019-10-09 DIAGNOSIS — D6861 Antiphospholipid syndrome: Secondary | ICD-10-CM | POA: Diagnosis not present

## 2019-10-09 DIAGNOSIS — E559 Vitamin D deficiency, unspecified: Secondary | ICD-10-CM | POA: Diagnosis not present

## 2019-10-09 DIAGNOSIS — M0579 Rheumatoid arthritis with rheumatoid factor of multiple sites without organ or systems involvement: Secondary | ICD-10-CM | POA: Diagnosis not present

## 2019-10-09 DIAGNOSIS — R5382 Chronic fatigue, unspecified: Secondary | ICD-10-CM | POA: Diagnosis not present

## 2019-10-13 ENCOUNTER — Encounter: Payer: Self-pay | Admitting: Nurse Practitioner

## 2019-10-13 ENCOUNTER — Other Ambulatory Visit: Payer: Self-pay | Admitting: Nurse Practitioner

## 2019-10-13 ENCOUNTER — Ambulatory Visit: Payer: Medicare HMO | Attending: Nurse Practitioner | Admitting: Nurse Practitioner

## 2019-10-13 ENCOUNTER — Other Ambulatory Visit: Payer: Self-pay

## 2019-10-13 VITALS — BP 154/81 | HR 68 | Temp 97.7°F | Wt 321.0 lb

## 2019-10-13 DIAGNOSIS — N393 Stress incontinence (female) (male): Secondary | ICD-10-CM

## 2019-10-13 DIAGNOSIS — E559 Vitamin D deficiency, unspecified: Secondary | ICD-10-CM

## 2019-10-13 DIAGNOSIS — R000 Tachycardia, unspecified: Secondary | ICD-10-CM

## 2019-10-13 DIAGNOSIS — I890 Lymphedema, not elsewhere classified: Secondary | ICD-10-CM

## 2019-10-13 DIAGNOSIS — Z86711 Personal history of pulmonary embolism: Secondary | ICD-10-CM | POA: Diagnosis not present

## 2019-10-13 DIAGNOSIS — Z1231 Encounter for screening mammogram for malignant neoplasm of breast: Secondary | ICD-10-CM | POA: Diagnosis not present

## 2019-10-13 DIAGNOSIS — E785 Hyperlipidemia, unspecified: Secondary | ICD-10-CM

## 2019-10-13 DIAGNOSIS — R2681 Unsteadiness on feet: Secondary | ICD-10-CM

## 2019-10-13 MED ORDER — CARVEDILOL 6.25 MG PO TABS: 6.2500 mg | ORAL_TABLET | Freq: Two times a day (BID) | ORAL | 2 refills | Status: DC

## 2019-10-13 MED ORDER — APIXABAN 5 MG PO TABS: ORAL_TABLET | ORAL | 3 refills | Status: DC

## 2019-10-13 MED FILL — ELIQUIS 5 MG TABLET: 5 | 30 days supply | Qty: 60 | Fill #0

## 2019-10-13 NOTE — Progress Notes (Signed)
Assessment & Plan:  [name] was seen today for follow-up.  Diagnoses and all orders for this visit:  Stress incontinence -     Ambulatory referral to Urology  Gait instability -     Ambulatory referral to Physical Therapy -     Ambulatory referral to Podiatry  Sinus tachycardia -     carvedilol (COREG) 6.25 MG tablet; Take 1 tablet (6.25 mg total) by mouth 2 (two) times daily with a meal.  Elephantiasis -     Ambulatory referral to Podiatry  Vitamin D deficiency disease -     VITAMIN D 25 Hydroxy (Vit-D Deficiency, Fractures)  Breast cancer screening by mammogram -     MM 3D SCREEN BREAST BILATERAL; Future  Dyslipidemia, goal LDL below 70 -     Lipid panel INSTRUCTIONS: Work on a low fat, heart healthy diet and participate in regular aerobic exercise program by working out at least 150 minutes per week; 5 days a week-30 minutes per day. Avoid red meat/beef/steak,  fried foods. junk foods, sodas, sugary drinks, unhealthy snacking, alcohol and smoking.  Drink at least 80 oz of water per day and monitor your carbohydrate intake daily.   History of pulmonary embolus (PE) -     apixaban (ELIQUIS) 5 MG TABS tablet; TAKE 1 TABLET (5 MG TOTAL) BY MOUTH 2 (TWO) TIMES DAILY.    Patient has been counseled on age-appropriate routine health concerns for screening and prevention. These are reviewed and up-to-date. Referrals have been placed accordingly. Immunizations are up-to-date or declined.    Subjective:   Chief Complaint  Patient presents with  . Follow-up    Pt. would like to ask PCP if she can get the Covid Vaccine, requesting physical therarpy.   HPI [name] 54 y.o. female presents to office today for follow up.  has a past medical history of Anemia, Anxiety, Arthritis, Asthma, Blood transfusion (01/22/11), Depression (08/2010), Dyspnea, Fibroid, Headache(784.0), Keloid, Lymphedema, and Pulmonary embolism ([place]).  Requesting bariatric rollator due to gait  instability and Pure wick urinary device for her stress incontinence which seems to have worsened. She is sometimes unable to make it to the bathroom in time and will soil her underwear if she does not have protective garments on.    Lymphedema She has had several near fall accidents due to gain instability and significant BLE edema.   Insomnia Having trouble falling asleep and staying asleep. I have recommended over the counters such as melatonin and tylenol pm at this time. If ineffective will try trazodone.        Review of Systems  Constitutional: Negative for fever, malaise/fatigue and weight loss.  HENT: Negative.  Negative for nosebleeds.   Eyes: Negative.  Negative for blurred vision, double vision and photophobia.  Respiratory: Negative.  Negative for cough, shortness of breath and wheezing.   Cardiovascular: Positive for leg swelling. Negative for chest pain and palpitations.  Gastrointestinal: Negative.  Negative for heartburn, nausea and vomiting.  Genitourinary: Positive for frequency.       SEE HPI  Musculoskeletal: Negative.  Negative for myalgias.  Neurological: Negative.  Negative for dizziness, focal weakness, seizures and headaches.  Psychiatric/Behavioral: Negative.  Negative for suicidal ideas.    Past Medical History:  Diagnosis Date  . Anemia   . Anxiety   . Arthritis   . Asthma    hx as child - no prob as adult - no inhaler  . Blood transfusion 01/22/11   transfusion 2 units at [facility]  .  Depression 08/2010   psych assessment  . Dyspnea   . Fibroid   . Headache(784.0)    rx for imitrex - last one jan  . Keloid   . Lymphedema   . Pulmonary embolism [facility])     Past Surgical History:  Procedure Laterality Date  . ABDOMINAL HYSTERECTOMY    . COLONOSCOPY N/A 11/08/2017   Procedure: COLONOSCOPY;  Surgeon: Danie Binder, MD;  [facility];  Service: Endoscopy;  Laterality: N/A;  2:00pm  . FLEXIBLE SIGMOIDOSCOPY N/A 09/17/2017   Procedure:  FLEXIBLE SIGMOIDOSCOPY;  Surgeon: Danie Binder, MD;  [facility];  Service: Endoscopy;  Laterality: N/A;  . HERNIA REPAIR  [id number]   umbilical  . POLYPECTOMY  11/08/2017   Procedure: POLYPECTOMY;  Surgeon: Danie Binder, MD;  [facility];  Service: Endoscopy;;  ascending colon (CSx1), transverse colon (CS x1), splenic flexure (HSx1)  . SVD     Spontaneous vaginal delivery; x 1    Family History  Problem Relation Age of Onset  . Heart disease Mother   . Hypertension Mother   . Hypertension Father   . Colon cancer Father 57       Passed away 20 yrs old  . Hypertension Sister   . Cancer Maternal Grandmother        gastric cancer  . Cancer Maternal Grandfather        pancreatic cancer  . Colon cancer Paternal Grandmother   . Colon cancer Paternal Uncle   . Colon polyps Neg Hx     Social History Reviewed with no changes to be made today.   Outpatient Medications Prior to Visit  Medication Sig Dispense Refill  . folic acid (FOLVITE) 1 MG tablet Take 1 mg by mouth daily.    . Methotrexate 25 MG/ML SOSY Inject into the skin.    . predniSONE (DELTASONE) 5 MG tablet Take 5 mg by mouth daily with breakfast.    . Vitamins A & D (VITAMIN A & D) ointment Apply 1 application topically daily as needed for dry skin.    Marland Kitchen apixaban (ELIQUIS) 5 MG TABS tablet TAKE 1 TABLET (5 MG TOTAL) BY MOUTH 2 (TWO) TIMES DAILY. 60 tablet 0  . carvedilol (COREG) 6.25 MG tablet TAKE 1 TABLET (6.25 MG TOTAL) BY MOUTH 2 (TWO) TIMES DAILY WITH A MEAL. 60 tablet 2  . acetaminophen (TYLENOL) 325 MG tablet Take 2 tablets (650 mg total) by mouth every 6 (six) hours as needed for mild pain (or Fever >/= 101). (Patient not taking: Reported on 10/13/2019) 60 tablet 0  . DULoxetine (CYMBALTA) 60 MG capsule Take 1 capsule (60 mg total) by mouth daily. 90 capsule 0   No facility-administered medications prior to visit.    Allergies  Allergen Reactions  . Bee Venom Anaphylaxis  . Augmentin  [Amoxicillin-Pot Clavulanate] Hives, Itching and Other (See Comments)    Did PCN reaction causing immediate rash, facial/tongue/throat swelling, SOB or lightheadedness with hypotension: yes Did PCN reaction causing severe rash involving mucus membranes or skin necrosis: no Has patient had a PCN reaction that required hospitalization: in hospital Has patient had a PCN reaction occurring within the last 10 years: no If all of the above answers are "NO", then may proceed with Cephalosporin use.  Pt reports no recollection of any reactions when taking penicillin in past  . Latex Hives and Other (See Comments)    Local reaction (welts). Patient denies any wheezing or other reaction with latex  exposure  . Nulytely [Peg 3350-Kcl-Na Bicarb-Nacl]     NAUSEA AND VOMITING. MAY TOLERATE LOW VOLUME PREP.       Objective:    BP (!) 154/81 (BP Location: Left Arm, Patient Position: Sitting, Cuff Size: Large)   Pulse 68   Temp 97.7 F (36.5 C) (Temporal)   Wt (!) 321 lb (145.6 kg)   LMP 01/31/2011   SpO2 97%   BMI 43.54 kg/m  Wt Readings from Last 3 Encounters:  10/13/19 (!) 321 lb (145.6 kg)  01/06/19 272 lb 4.3 oz (123.5 kg)  05/16/18 (!) 300 lb 14.4 oz (136.5 kg)    Physical Exam Vitals and nursing note reviewed.  Constitutional:      Appearance: She is well-developed.  HENT:     Head: Normocephalic and atraumatic.  Cardiovascular:     Rate and Rhythm: Normal rate and regular rhythm.     Heart sounds: Normal heart sounds. No murmur heard.  No friction rub. No gallop.   Pulmonary:     Effort: Pulmonary effort is normal. No tachypnea or respiratory distress.     Breath sounds: Normal breath sounds. No decreased breath sounds, wheezing, rhonchi or rales.  Chest:     Chest wall: No tenderness.  Abdominal:     General: Bowel sounds are normal.     Palpations: Abdomen is soft.  Musculoskeletal:        General: Normal range of motion.     Cervical back: Normal range of motion.      Right lower leg: Edema present.     Left lower leg: Edema present.  Skin:    General: Skin is warm and dry.  Neurological:     Mental Status: She is alert and oriented to person, place, and time.     Coordination: Coordination normal.     Gait: Gait abnormal.  Psychiatric:        Behavior: Behavior normal. Behavior is cooperative.        Thought Content: Thought content normal.        Judgment: Judgment normal.          Patient has been counseled extensively about nutrition and exercise as well as the importance of adherence with medications and regular follow-up. The patient was given clear instructions to go to ER or return to medical center if symptoms don't improve, worsen or new problems develop. The patient verbalized understanding.   Follow-up: Return in about 3 months (around 01/13/2020).   Gildardo Pounds, FNP-BC [facility] and [facility] [place], [place]   10/19/2019, 4:59 PM

## 2019-10-14 LAB — LIPID PANEL
Chol/HDL Ratio: 3.1 ratio (ref 0.0–4.4)
Cholesterol, Total: 218 mg/dL — ABNORMAL HIGH (ref 100–199)
HDL: 71 mg/dL (ref >39)
LDL Chol Calc (NIH): 134 mg/dL — ABNORMAL HIGH (ref 0–99)
Triglycerides: 75 mg/dL (ref 0–149)
VLDL Cholesterol Cal: 13 mg/dL (ref 5–40)

## 2019-10-14 LAB — VITAMIN D 25 HYDROXY (VIT D DEFICIENCY, FRACTURES): Vit D, 25-Hydroxy: 7.4 ng/mL — ABNORMAL LOW (ref 30.0–100.0)

## 2019-10-16 ENCOUNTER — Other Ambulatory Visit: Payer: Self-pay | Admitting: Nurse Practitioner

## 2019-10-16 MED ORDER — VITAMIN D (ERGOCALCIFEROL) 1.25 MG (50000 UNIT) PO CAPS: 50000.0000 [IU] | ORAL_CAPSULE | ORAL | 1 refills | Status: DC

## 2019-10-16 MED ORDER — BARIATRIC ROLLATOR MISC: 0 refills | Status: DC

## 2019-10-16 MED ORDER — SELF-CATH CLOSED SYSTEM FEMALE KIT: PACK | 99 refills | Status: DC

## 2019-10-17 MED FILL — VIT D2 1.25 MG (50,000 UNIT: 1.25 MG | 28 days supply | Qty: 4 | Fill #0

## 2019-10-19 ENCOUNTER — Encounter: Payer: Self-pay | Admitting: Nurse Practitioner

## 2019-10-23 ENCOUNTER — Encounter: Payer: Self-pay | Admitting: Nurse Practitioner

## 2019-10-23 IMAGING — CT CT ABD-PELV W/ CM
2 of 5 series · 15 of 46 positions shown, 17 images · IV contrast (ISOVUE)
Comparison: 12/31/2016 stone study.

CLINICAL DATA: Right-sided chest and upper abdominal pain. Nausea.
Recent kidney infection. Ovarian/fallopian/ peritoneal cancer
suspected. Prior hysterectomy.

EXAM:
CT ABDOMEN AND PELVIS WITH CONTRAST
TECHNIQUE: Multidetector CT imaging of the abdomen and pelvis was performed
using the standard protocol following bolus administration of
intravenous contrast.
CONTRAST:  100mL [id number] IOPAMIDOL ([id number]) INJECTION 61%

[Series 2: abd/pel with · axial · 0.83mm/px · z∈[+1011,+1431]mm · 12 of 96 slices shown, 14 images]
[im 6/96  soft-tissue]
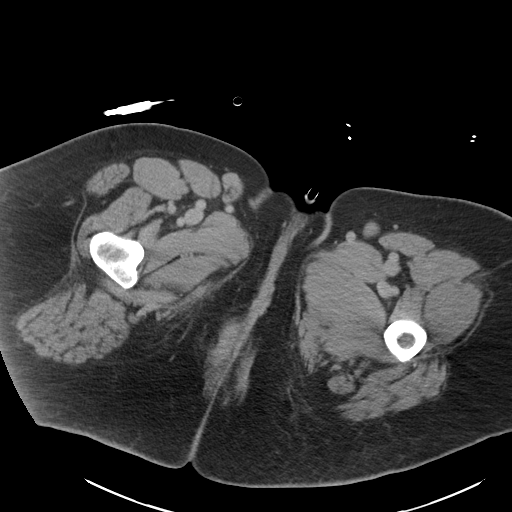
[im 6/96  bone]
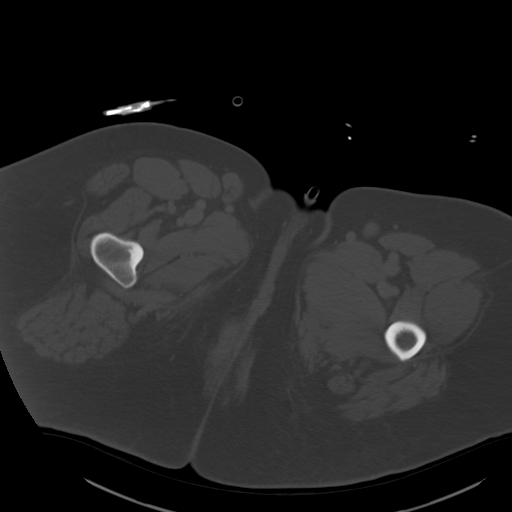
[im 12/96  soft-tissue]
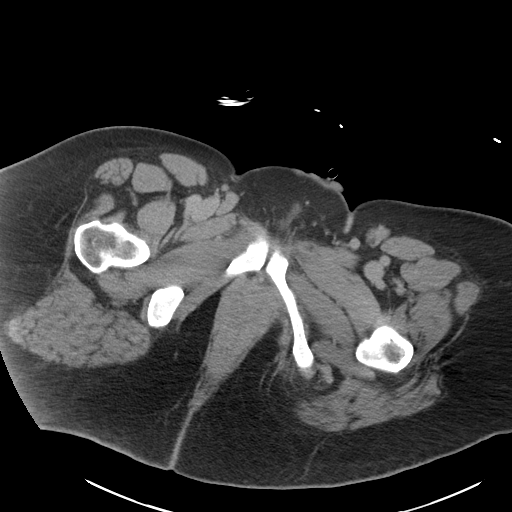
[im 24/96  soft-tissue]
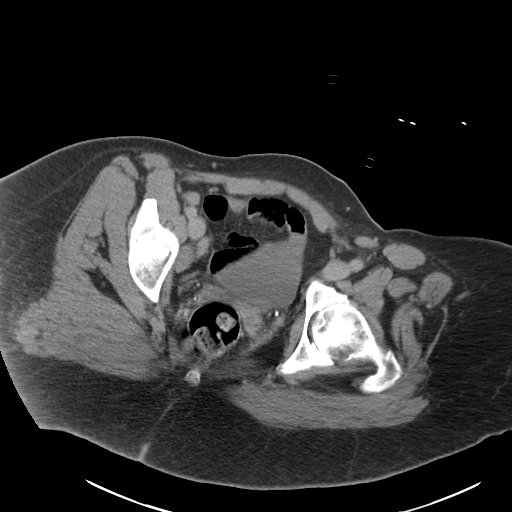
[im 30/96  soft-tissue]
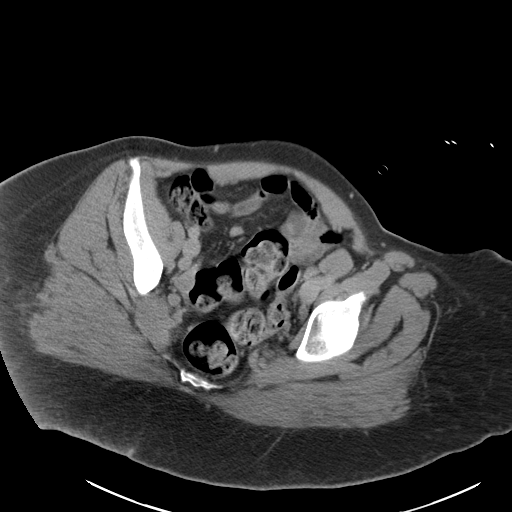
[im 36/96  soft-tissue]
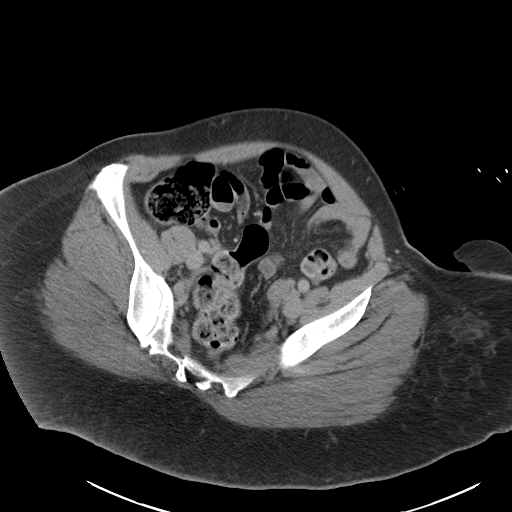
[im 42/96  soft-tissue]
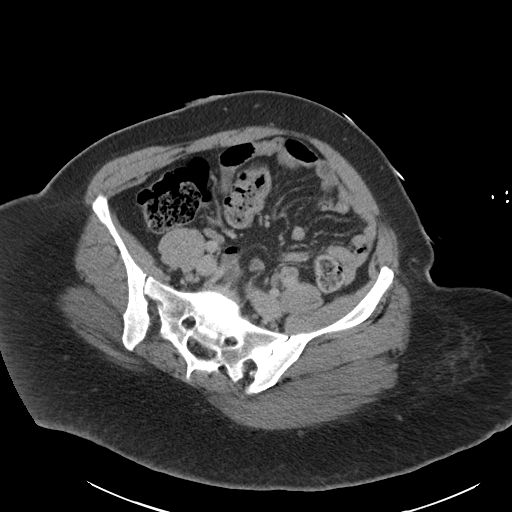
[im 54/96  soft-tissue]
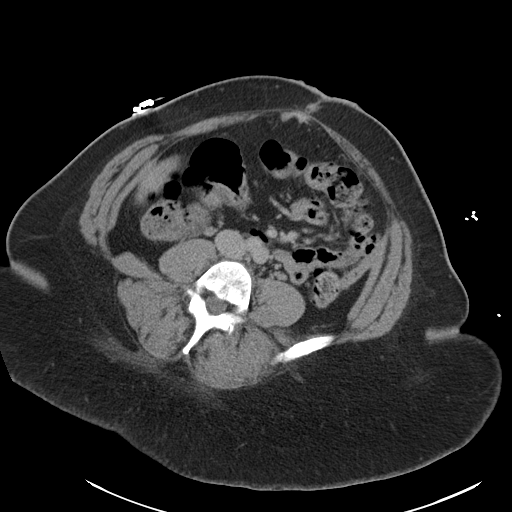
[im 60/96  soft-tissue]
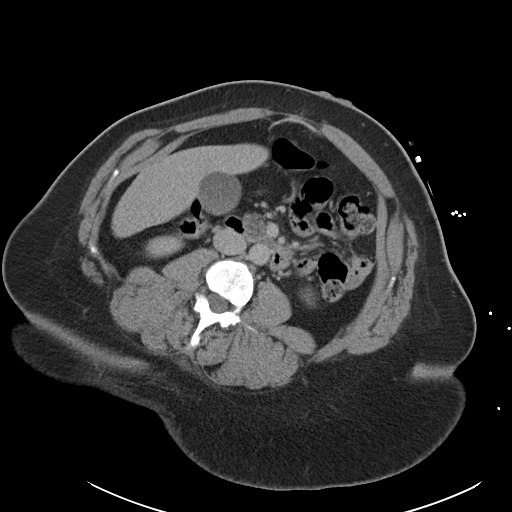
[im 66/96  soft-tissue]
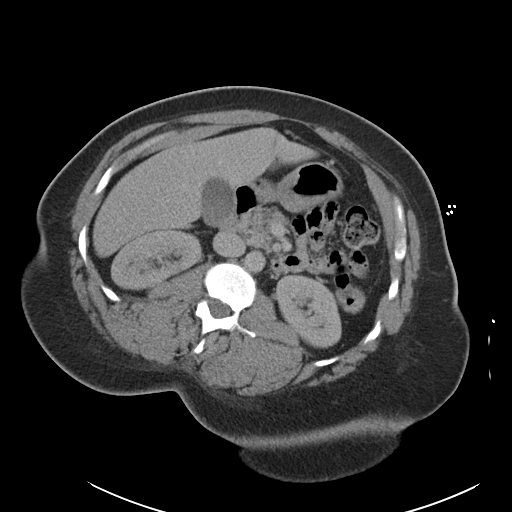
[im 66/96  bone]
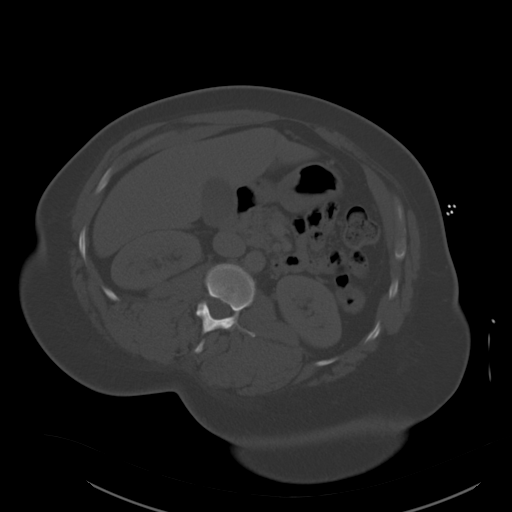
[im 72/96  soft-tissue]
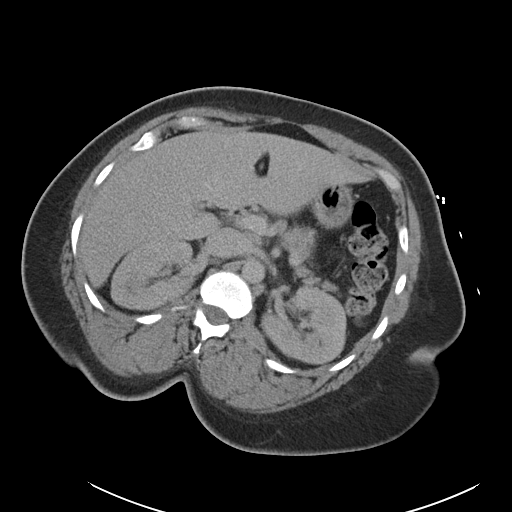
[im 84/96  soft-tissue]
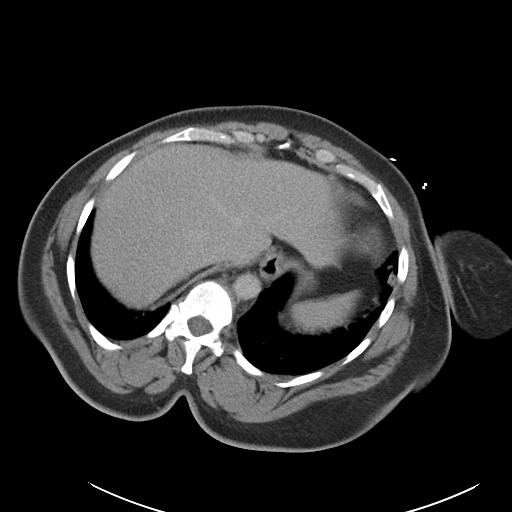
[im 90/96  soft-tissue]
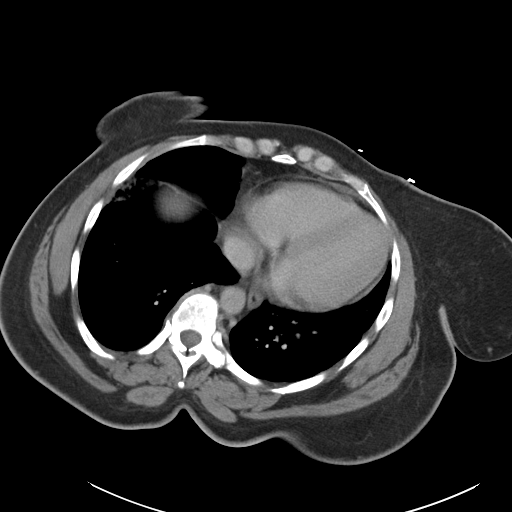

[Series 4: coronal a/|p · coronal · 0.74mm/px · 3 of 191 slices shown]
[im 64/191  soft-tissue]
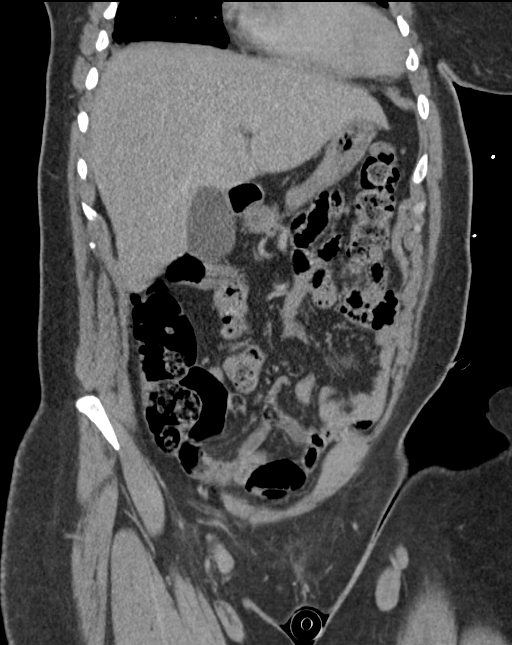
[im 85/191  soft-tissue]
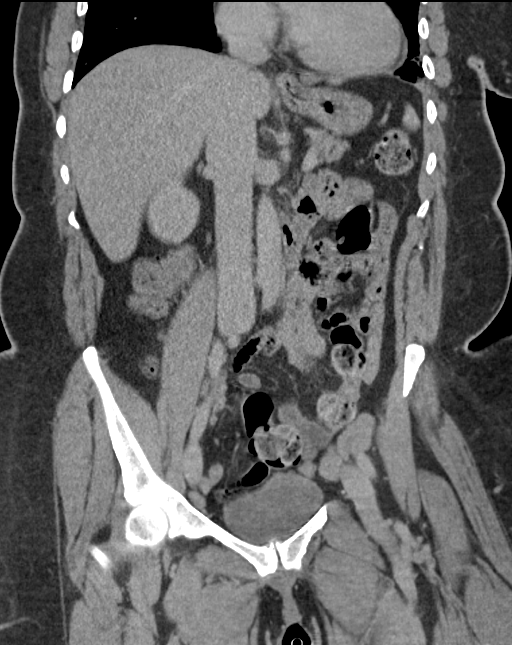
[im 106/191  soft-tissue]
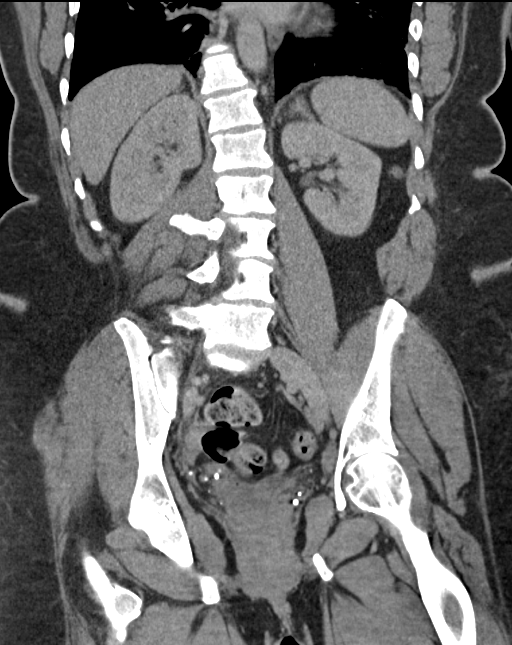

[15 of 46 positions shown; findings below may reference images not displayed]

FINDINGS: Lower chest: Anterior right lower lobe airspace disease at the site
of minimal scarring on the prior. There is also dependent and
lingular presumed scarring. Mild cardiomegaly, without pericardial
effusion. Tiny hiatal hernia.

Hepatobiliary: Well-circumscribed right hepatic lobe low-density
lesion is likely a cyst. Normal gallbladder, without biliary ductal
dilatation.

Pancreas: Normal, without mass or ductal dilatation.

Spleen: Normal in size, without focal abnormality.

Adrenals/Urinary Tract: Normal adrenal glands. Bilateral too small
to characterize renal lesions. Normal urinary bladder.

Stomach/Bowel: Normal stomach, without wall thickening. Colonic
stool burden suggests constipation. Normal terminal ileum. Appendix
not visualized. Normal small bowel.

Vascular/Lymphatic: Normal caliber of the aorta and branch vessels.
No retroperitoneal or retrocrural adenopathy. Increased number and
size of pelvic sidewall lymph nodes. Index 1.2 cm right external
iliac node is similar to on the prior exam. There are enlarged
inguinal nodes which are similar to on the recent priors.

Reproductive: Hysterectomy.  No adnexal mass.

Other: No significant free fluid. Prior ventral abdominal wall
hernia repair. There is recurrent or residual small fat containing
hernia inferior to the mesh. There is probable overlying
scar/keloid.

Musculoskeletal: Convex left lumbar spine curvature.
IMPRESSION: 1.  No acute process in the abdomen or pelvis.
2.  Possible constipation.
3. Pelvic adenopathy, similar on recent prior exams. This could be
reactive. Correlate with any primary malignancy history. Consider
follow-up pelvic CT at 3-6 months. If any history of primary
malignancy, consider PET.
4. Increased anterior right lung base opacity could represent an
area of infection or given the clinical history of lower right chest
pain, pulmonary infarct secondary to pulmonary embolism. If this is
a concern, consider dedicated CTA of the chest.

## 2019-10-23 NOTE — Telephone Encounter (Signed)
[name] can you check on these requests. I placed the orders on the 19th and [facility] has told the patient they have not received. Thanks!

## 2019-10-27 MED ORDER — BARIATRIC ROLLATOR MISC: 0 refills | Status: DC

## 2019-10-27 NOTE — Telephone Encounter (Signed)
CMA will fax it to the [facility].  [facility] will reach out to patient.

## 2019-11-04 MED FILL — FOLIC ACID 1 MG TABS: 1 | 90 days supply | Qty: 90 | Fill #1

## 2019-11-05 ENCOUNTER — Ambulatory Visit: Payer: Medicare HMO | Admitting: Podiatry

## 2019-11-05 ENCOUNTER — Ambulatory Visit: Payer: Medicare HMO | Admitting: Orthotics

## 2019-11-05 ENCOUNTER — Other Ambulatory Visit: Payer: Self-pay

## 2019-11-05 ENCOUNTER — Encounter: Payer: Self-pay | Admitting: Podiatry

## 2019-11-05 ENCOUNTER — Ambulatory Visit: Payer: Self-pay

## 2019-11-05 ENCOUNTER — Telehealth: Payer: Self-pay | Admitting: Nurse Practitioner

## 2019-11-05 DIAGNOSIS — R5381 Other malaise: Secondary | ICD-10-CM | POA: Diagnosis not present

## 2019-11-05 DIAGNOSIS — B351 Tinea unguium: Secondary | ICD-10-CM

## 2019-11-05 DIAGNOSIS — R2681 Unsteadiness on feet: Secondary | ICD-10-CM | POA: Diagnosis not present

## 2019-11-05 DIAGNOSIS — M79675 Pain in left toe(s): Secondary | ICD-10-CM

## 2019-11-05 DIAGNOSIS — I890 Lymphedema, not elsewhere classified: Secondary | ICD-10-CM

## 2019-11-05 DIAGNOSIS — M79674 Pain in right toe(s): Secondary | ICD-10-CM | POA: Diagnosis not present

## 2019-11-05 DIAGNOSIS — R600 Localized edema: Secondary | ICD-10-CM | POA: Diagnosis not present

## 2019-11-05 MED ORDER — BARIATRIC ROLLATOR MISC: 0 refills | Status: DC

## 2019-11-05 MED ORDER — SELF-CATH CLOSED SYSTEM FEMALE KIT: PACK | 99 refills | Status: DC

## 2019-11-05 NOTE — Progress Notes (Signed)
Subjective:   Patient ID: [name], female   DOB: 54 y.o.   MRN: [id number]   HPI Patient presents stating she is developed severe lymphedema over the last 3 years she cannot take care of her own toenails and she cannot reach her feet and she is desperate for some kind of shoe gear customized that may work for her severe condition that she has.  Patient states this started all of a sudden and developed very quickly.  Patient does not smoke likes to be active if possible   Review of Systems  All other systems reviewed and are negative.       Objective:  Physical Exam Vitals and nursing note reviewed.  Constitutional:      Appearance: She is well-developed.  Pulmonary:     Effort: Pulmonary effort is normal.  Musculoskeletal:        General: Normal range of motion.  Skin:    General: Skin is warm.  Neurological:     Mental Status: She is alert.   Neurological appear to be intact with moderate diminishment of sharp dull vibratory.  Patient has severe edema in the left foot and ankle lower leg left over right with severe deformity and impossibility to wear normal conventional shoe gear.  Has not a good idea of how this happened and is not seeing a specialist currently and cannot ambulate with any degree of comfort and uses a walker.  Patient has severe nail disease 1-5 both feet with incurvation and pain     Assessment:  Unfortunate situation with severe probable lymphedema with painful mycotic nail infections and swelling     Plan:  H&P all conditions reviewed with her and caregiver.  At this point I debrided nailbeds 1-5 both feet and 14 there is nothing else I can do even though I have recommended consideration for segmental pressure treatment to try to help with lymphedema.  Will also see [facility] orthotist for possibility of a shoe that may fit over her feet due to the nature of the severe lymphedema condition

## 2019-11-05 NOTE — Progress Notes (Signed)
Confirm [id number]  Ordered apis [id number]   15 [id number]

## 2019-11-05 NOTE — Telephone Encounter (Signed)
Patients daughter came into the office regarding incontinence supplies and and rolling walker . Patients daughter stated that she spoke to [facility] and they have not yet received anything. Please follow up at your earliest convenience.

## 2019-11-06 DIAGNOSIS — E559 Vitamin D deficiency, unspecified: Secondary | ICD-10-CM | POA: Diagnosis not present

## 2019-11-06 DIAGNOSIS — D6861 Antiphospholipid syndrome: Secondary | ICD-10-CM | POA: Diagnosis not present

## 2019-11-06 DIAGNOSIS — R5382 Chronic fatigue, unspecified: Secondary | ICD-10-CM | POA: Diagnosis not present

## 2019-11-06 DIAGNOSIS — M2550 Pain in unspecified joint: Secondary | ICD-10-CM | POA: Diagnosis not present

## 2019-11-06 DIAGNOSIS — Z6841 Body Mass Index (BMI) 40.0 and over, adult: Secondary | ICD-10-CM | POA: Diagnosis not present

## 2019-11-06 DIAGNOSIS — M0579 Rheumatoid arthritis with rheumatoid factor of multiple sites without organ or systems involvement: Secondary | ICD-10-CM | POA: Diagnosis not present

## 2019-11-06 DIAGNOSIS — I890 Lymphedema, not elsewhere classified: Secondary | ICD-10-CM | POA: Diagnosis not present

## 2019-11-07 NOTE — Telephone Encounter (Signed)
Spoke to patient.  Pt. Received her Rollator. CMA informed that [facility] no not have External catheter, they only have internal catheter.  Pt. Understood.

## 2019-11-10 DIAGNOSIS — I890 Lymphedema, not elsewhere classified: Secondary | ICD-10-CM | POA: Diagnosis not present

## 2019-11-27 DIAGNOSIS — H5213 Myopia, bilateral: Secondary | ICD-10-CM | POA: Diagnosis not present

## 2019-11-27 MED FILL — ELIQUIS 5 MG TABLET: 5 | 30 days supply | Qty: 60 | Fill #1

## 2019-11-27 MED FILL — VIT D2 1.25 MG (50,000 UNIT: 1.25 MG | 28 days supply | Qty: 4 | Fill #1

## 2019-12-09 ENCOUNTER — Telehealth: Payer: Self-pay | Admitting: *Deleted

## 2019-12-09 NOTE — Telephone Encounter (Signed)
Ayana w/ [facility] is calling for confirmation on a fax sent ( letter of necessity, where the pump is going to be placed and physician's signature) that was sent Oct. 6th. Please call at [phone].

## 2019-12-11 ENCOUNTER — Other Ambulatory Visit: Payer: Medicare HMO | Admitting: Orthotics

## 2019-12-19 ENCOUNTER — Telehealth: Payer: Self-pay | Admitting: Podiatry

## 2019-12-19 NOTE — Telephone Encounter (Signed)
Ayana called in from [facility] regarding medical necessity form that was faxed over, they have not received form and patient has equipment. They are requesting that we have Dr.Regal sign paperwork and fax back to them as soon as possible

## 2019-12-22 NOTE — Telephone Encounter (Signed)
I have refaxed the medical necessity over to [facility].

## 2019-12-25 ENCOUNTER — Ambulatory Visit: Payer: Medicare HMO

## 2019-12-31 MED FILL — CARVEDILOL 6.25 MG TABLET: 6.25 | 30 days supply | Qty: 60 | Fill #0

## 2020-01-05 MED FILL — ELIQUIS 5 MG TABLET: 5 | 30 days supply | Qty: 60 | Fill #2

## 2020-01-05 MED FILL — VIT D2 1.25 MG (50,000 UNIT: 1.25 MG | 28 days supply | Qty: 4 | Fill #2

## 2020-01-07 DIAGNOSIS — M2550 Pain in unspecified joint: Secondary | ICD-10-CM | POA: Diagnosis not present

## 2020-01-07 DIAGNOSIS — E559 Vitamin D deficiency, unspecified: Secondary | ICD-10-CM | POA: Diagnosis not present

## 2020-01-07 DIAGNOSIS — R5382 Chronic fatigue, unspecified: Secondary | ICD-10-CM | POA: Diagnosis not present

## 2020-01-07 DIAGNOSIS — D6861 Antiphospholipid syndrome: Secondary | ICD-10-CM | POA: Diagnosis not present

## 2020-01-07 DIAGNOSIS — I890 Lymphedema, not elsewhere classified: Secondary | ICD-10-CM | POA: Diagnosis not present

## 2020-01-07 DIAGNOSIS — M0579 Rheumatoid arthritis with rheumatoid factor of multiple sites without organ or systems involvement: Secondary | ICD-10-CM | POA: Diagnosis not present

## 2020-01-07 DIAGNOSIS — Z6841 Body Mass Index (BMI) 40.0 and over, adult: Secondary | ICD-10-CM | POA: Diagnosis not present

## 2020-01-13 ENCOUNTER — Encounter: Payer: Self-pay | Admitting: Nurse Practitioner

## 2020-01-13 ENCOUNTER — Other Ambulatory Visit: Payer: Self-pay

## 2020-01-13 ENCOUNTER — Ambulatory Visit: Payer: Medicare HMO | Attending: Nurse Practitioner | Admitting: Nurse Practitioner

## 2020-01-13 DIAGNOSIS — R7309 Other abnormal glucose: Secondary | ICD-10-CM

## 2020-01-13 DIAGNOSIS — Z7689 Persons encountering health services in other specified circumstances: Secondary | ICD-10-CM

## 2020-01-13 DIAGNOSIS — D649 Anemia, unspecified: Secondary | ICD-10-CM

## 2020-01-13 DIAGNOSIS — Z86711 Personal history of pulmonary embolism: Secondary | ICD-10-CM | POA: Diagnosis not present

## 2020-01-13 DIAGNOSIS — R2681 Unsteadiness on feet: Secondary | ICD-10-CM | POA: Diagnosis not present

## 2020-01-13 DIAGNOSIS — E876 Hypokalemia: Secondary | ICD-10-CM

## 2020-01-13 DIAGNOSIS — N393 Stress incontinence (female) (male): Secondary | ICD-10-CM | POA: Diagnosis not present

## 2020-01-13 DIAGNOSIS — Z124 Encounter for screening for malignant neoplasm of cervix: Secondary | ICD-10-CM | POA: Diagnosis not present

## 2020-01-13 DIAGNOSIS — G8929 Other chronic pain: Secondary | ICD-10-CM

## 2020-01-13 DIAGNOSIS — R109 Unspecified abdominal pain: Secondary | ICD-10-CM

## 2020-01-13 MED ORDER — APIXABAN 5 MG PO TABS: ORAL_TABLET | ORAL | 3 refills | Status: DC

## 2020-01-13 NOTE — Progress Notes (Signed)
Virtual Visit via Telephone Note Due to national recommendations of social distancing due to [place] 19, telehealth visit is felt to be most appropriate for this patient at this time.  I discussed the limitations, risks, security and privacy concerns of performing an evaluation and management service by telephone and the availability of in person appointments. I also discussed with the patient that there may be a patient responsible charge related to this service. The patient expressed understanding and agreed to proceed.    I connected with [name] on 01/13/20  at   3:10 PM EST  EDT by telephone and verified that I am speaking with the correct person using two identifiers.   Consent I discussed the limitations, risks, security and privacy concerns of performing an evaluation and management service by telephone and the availability of in person appointments. I also discussed with the patient that there may be a patient responsible charge related to this service. The patient expressed understanding and agreed to proceed.   Location of Patient: Private Residence    Location of Provider: [place] and [employer] Office    Persons participating in Telemedicine visit: [name] FNP-BC [place]    History of Present Illness: Telemedicine visit for: Follow Up PMH significant for Rheumatoid arthritis, bilateral LE lymphedema, urinary incontinence, PE She is currently seeing podiatry and rheumatology. She was referred to Urology for stress incontinence (unsuccessful with Ditropan) unfortunately she has not heard from the urologist to schedule. I also referred her to physical therapy for gait instability however they were unable to contact her due to phone difficulties.   Flank Pain Has complaints of right sided flank pain. Onset a few months ago. Worse in the mornings upon awakening and then lessens during the day. She does not endorse hematuria or  dysuria. She does have stress incontinence which I have referred her to see Urology.   Lymphedema Fortunately her podiatrist ordered her bilateral pneumatic compression device for her lymphedema. Using twice  Day for an hour each time.   Taking Humira twice a month. Methotrexate weekly. Next appt with rheumatology in January.    Past Medical History:  Diagnosis Date  . Anemia   . Anxiety   . Arthritis   . Asthma    hx as child - no prob as adult - no inhaler  . Blood transfusion 01/22/11   transfusion 2 units at [facility]  . Depression 08/2010   psych assessment  . Dyspnea   . Fibroid   . Headache(784.0)    rx for imitrex - last one jan  . Keloid   . Lymphedema   . Pulmonary embolism [facility])     Past Surgical History:  Procedure Laterality Date  . ABDOMINAL HYSTERECTOMY    . COLONOSCOPY N/A 11/08/2017   Procedure: COLONOSCOPY;  Surgeon: Danie Binder, MD;  [facility];  Service: Endoscopy;  Laterality: N/A;  2:00pm  . FLEXIBLE SIGMOIDOSCOPY N/A 09/17/2017   Procedure: FLEXIBLE SIGMOIDOSCOPY;  Surgeon: Danie Binder, MD;  [facility];  Service: Endoscopy;  Laterality: N/A;  . HERNIA REPAIR  [id number]   umbilical  . POLYPECTOMY  11/08/2017   Procedure: POLYPECTOMY;  Surgeon: Danie Binder, MD;  [facility];  Service: Endoscopy;;  ascending colon (CSx1), transverse colon (CS x1), splenic flexure (HSx1)  . SVD     Spontaneous vaginal delivery; x 1    Family History  Problem Relation Age of Onset  . Heart  disease Mother   . Hypertension Mother   . Hypertension Father   . Colon cancer Father 35       Passed away 54 yrs old  . Hypertension Sister   . Cancer Maternal Grandmother        gastric cancer  . Cancer Maternal Grandfather        pancreatic cancer  . Colon cancer Paternal Grandmother   . Colon cancer Paternal Uncle   . Colon polyps Neg Hx     Social History   Socioeconomic History  . Marital status: Single    Spouse name: Not on  file  . Number of children: 1  . Years of education: Not on file  . Highest education level: Not on file  Occupational History  . Occupation: medical billing    Employer: [employer]  Tobacco Use  . Smoking status: Never Smoker  . Smokeless tobacco: Never Used  Vaping Use  . Vaping Use: Never used  Substance and Sexual Activity  . Alcohol use: No  . Drug use: No  . Sexual activity: Not Currently    Birth control/protection: None, Surgical  Other Topics Concern  . Not on file  Social History Narrative  . Not on file   Social Determinants of Health   Financial Resource Strain:   . Difficulty of Paying Living Expenses: Not on file  Food Insecurity:   . Worried About Charity fundraiser in the Last Year: Not on file  . Ran Out of Food in the Last Year: Not on file  Transportation Needs:   . Lack of Transportation (Medical): Not on file  . Lack of Transportation (Non-Medical): Not on file  Physical Activity:   . Days of Exercise per Week: Not on file  . Minutes of Exercise per Session: Not on file  Stress:   . Feeling of Stress : Not on file  Social Connections:   . Frequency of Communication with Friends and Family: Not on file  . Frequency of Social Gatherings with Friends and Family: Not on file  . Attends Religious Services: Not on file  . Active Member of Clubs or Organizations: Not on file  . Attends Archivist Meetings: Not on file  . Marital Status: Not on file     Observations/Objective: Awake, alert and oriented x 3   Review of Systems  Constitutional: Negative for fever, malaise/fatigue and weight loss.  HENT: Negative.  Negative for nosebleeds.   Eyes: Negative.  Negative for blurred vision, double vision and photophobia.  Respiratory: Negative.  Negative for cough and shortness of breath.   Cardiovascular: Positive for leg swelling. Negative for chest pain and palpitations.  Gastrointestinal: Negative.  Negative for heartburn, nausea and vomiting.   Genitourinary:       Stress incontinence  Musculoskeletal: Positive for back pain and joint pain. Negative for myalgias.  Neurological: Negative.  Negative for dizziness, focal weakness, seizures and headaches.       Gait instability Physically deconditioned  Psychiatric/Behavioral: Negative.  Negative for suicidal ideas.    Assessment and Plan: Dinah was seen today for back pain.  Diagnoses and all orders for this visit:  Flank pain, chronic -     POCT URINALYSIS DIP (CLINITEK); Future  Stress incontinence -     Ambulatory referral to Urology -     POCT URINALYSIS DIP (CLINITEK); Future  Gait instability -     Ambulatory referral to Physical Therapy  History of pulmonary embolus (PE) -  apixaban (ELIQUIS) 5 MG TABS tablet; TAKE 1 TABLET (5 MG TOTAL) BY MOUTH 2 (TWO) TIMES DAILY.  Hypokalemia -     CMP14+EGFR; Future  Anemia, unspecified type -     CBC; Future  Encounter for Papanicolaou smear for cervical cancer screening  Referral of patient -     Ambulatory referral to Gynecology  Elevated glucose -     Hemoglobin A1c; Future     Follow Up Instructions Return in about 3 months (around 04/14/2020).     I discussed the assessment and treatment plan with the patient. The patient was provided an opportunity to ask questions and all were answered. The patient agreed with the plan and demonstrated an understanding of the instructions.   The patient was advised to call back or seek an in-person evaluation if the symptoms worsen or if the condition fails to improve as anticipated.  I provided 21 minutes of non-face-to-face time during this encounter including median intraservice time, reviewing previous notes, labs, imaging, medications and explaining diagnosis and management.  Gildardo Pounds, FNP-BC

## 2020-01-16 ENCOUNTER — Other Ambulatory Visit: Payer: Self-pay

## 2020-01-16 ENCOUNTER — Ambulatory Visit: Payer: Medicare HMO | Attending: Nurse Practitioner

## 2020-01-16 DIAGNOSIS — E876 Hypokalemia: Secondary | ICD-10-CM

## 2020-01-16 DIAGNOSIS — D649 Anemia, unspecified: Secondary | ICD-10-CM

## 2020-01-16 DIAGNOSIS — N393 Stress incontinence (female) (male): Secondary | ICD-10-CM | POA: Diagnosis not present

## 2020-01-16 DIAGNOSIS — G8929 Other chronic pain: Secondary | ICD-10-CM | POA: Diagnosis not present

## 2020-01-16 DIAGNOSIS — R109 Unspecified abdominal pain: Secondary | ICD-10-CM | POA: Diagnosis not present

## 2020-01-16 DIAGNOSIS — R7309 Other abnormal glucose: Secondary | ICD-10-CM

## 2020-01-16 LAB — POCT URINALYSIS DIP (CLINITEK)
Bilirubin, UA: NEGATIVE
Glucose, UA: NEGATIVE mg/dL
Ketones, POC UA: NEGATIVE mg/dL
Leukocytes, UA: NEGATIVE
Nitrite, UA: NEGATIVE
POC PROTEIN,UA: NEGATIVE
Spec Grav, UA: 1.02 (ref 1.010–1.025)
Urobilinogen, UA: 0.2 E.U./dL
pH, UA: 7 (ref 5.0–8.0)

## 2020-01-17 LAB — CMP14+EGFR
ALT: 15 IU/L (ref 0–32)
AST: 17 IU/L (ref 0–40)
Albumin/Globulin Ratio: 1.6 (ref 1.2–2.2)
Albumin: 4.9 g/dL (ref 3.8–4.9)
Alkaline Phosphatase: 73 IU/L (ref 44–121)
BUN/Creatinine Ratio: 9 (ref 9–23)
BUN: 8 mg/dL (ref 6–24)
Bilirubin Total: 1.1 mg/dL (ref 0.0–1.2)
CO2: 24 mmol/L (ref 20–29)
Calcium: 10.2 mg/dL (ref 8.7–10.2)
Chloride: 103 mmol/L (ref 96–106)
Creatinine, Ser: 0.93 mg/dL (ref 0.57–1.00)
GFR calc Af Amer: 82 mL/min/{1.73_m2} (ref >59)
GFR calc non Af Amer: 71 mL/min/{1.73_m2} (ref >59)
Globulin, Total: 3.1 g/dL (ref 1.5–4.5)
Glucose: 105 mg/dL — ABNORMAL HIGH (ref 65–99)
Potassium: 4.3 mmol/L (ref 3.5–5.2)
Sodium: 141 mmol/L (ref 134–144)
Total Protein: 8 g/dL (ref 6.0–8.5)

## 2020-01-17 LAB — CBC
Hematocrit: 39.3 % (ref 34.0–46.6)
Hemoglobin: 12.9 g/dL (ref 11.1–15.9)
MCH: 29.7 pg (ref 26.6–33.0)
MCHC: 32.8 g/dL (ref 31.5–35.7)
MCV: 90 fL (ref 79–97)
Platelets: 284 10*3/uL (ref 150–450)
RBC: 4.35 x10E6/uL (ref 3.77–5.28)
RDW: 13.8 % (ref 11.7–15.4)
WBC: 5.3 10*3/uL (ref 3.4–10.8)

## 2020-01-17 LAB — HEMOGLOBIN A1C
Est. average glucose Bld gHb Est-mCnc: 120 mg/dL
Hgb A1c MFr Bld: 5.8 % — ABNORMAL HIGH (ref 4.8–5.6)

## 2020-01-21 ENCOUNTER — Other Ambulatory Visit: Payer: Medicare HMO

## 2020-01-29 ENCOUNTER — Ambulatory Visit: Payer: Medicare HMO | Admitting: Orthotics

## 2020-02-04 ENCOUNTER — Ambulatory Visit: Payer: Medicare HMO | Admitting: Podiatry

## 2020-02-04 ENCOUNTER — Ambulatory Visit: Payer: Medicare HMO

## 2020-02-11 ENCOUNTER — Ambulatory Visit: Payer: Medicare HMO | Attending: Nurse Practitioner | Admitting: Physical Therapy

## 2020-02-12 ENCOUNTER — Other Ambulatory Visit: Payer: Self-pay

## 2020-02-12 ENCOUNTER — Ambulatory Visit: Payer: Medicare HMO | Admitting: Orthotics

## 2020-02-12 DIAGNOSIS — R600 Localized edema: Secondary | ICD-10-CM

## 2020-02-12 DIAGNOSIS — I890 Lymphedema, not elsewhere classified: Secondary | ICD-10-CM

## 2020-02-12 NOTE — Progress Notes (Signed)
Need to reorder; back ordered APIS until mid Feb.   Confirm:  [id number]

## 2020-02-17 MED FILL — FOLIC ACID 1 MG TABS: 1 | 90 days supply | Qty: 90 | Fill #0

## 2020-02-17 MED FILL — CARVEDILOL 6.25 MG TABLET: 6.25 | 30 days supply | Qty: 60 | Fill #1

## 2020-02-17 MED FILL — ELIQUIS 5 MG TABLET: 5 | 30 days supply | Qty: 60 | Fill #3

## 2020-03-01 ENCOUNTER — Ambulatory Visit: Payer: Medicare HMO | Admitting: Orthotics

## 2020-03-04 ENCOUNTER — Ambulatory Visit (INDEPENDENT_AMBULATORY_CARE_PROVIDER_SITE_OTHER): Payer: Medicare HMO | Admitting: Orthotics

## 2020-03-04 ENCOUNTER — Other Ambulatory Visit: Payer: Self-pay

## 2020-03-04 DIAGNOSIS — I890 Lymphedema, not elsewhere classified: Secondary | ICD-10-CM

## 2020-03-04 DIAGNOSIS — R600 Localized edema: Secondary | ICD-10-CM

## 2020-03-04 NOTE — Progress Notes (Signed)
Patient picked up self pay mt emey shoe.

## 2020-03-26 ENCOUNTER — Ambulatory Visit: Payer: Medicare HMO | Admitting: Podiatry

## 2020-04-07 ENCOUNTER — Other Ambulatory Visit: Payer: Self-pay

## 2020-04-07 ENCOUNTER — Ambulatory Visit (INDEPENDENT_AMBULATORY_CARE_PROVIDER_SITE_OTHER): Payer: Medicare HMO | Admitting: Obstetrics and Gynecology

## 2020-04-07 ENCOUNTER — Encounter: Payer: Self-pay | Admitting: Obstetrics and Gynecology

## 2020-04-07 DIAGNOSIS — Z90710 Acquired absence of both cervix and uterus: Secondary | ICD-10-CM

## 2020-04-07 NOTE — Progress Notes (Signed)
   Patient with total hysterectomy in 2012. H&P from that surgery stated she had a history of cryo in the early 90s but normal pap smears since then with last pap in 2012. No paps since surgery and patient denies any GYN s/s.  Pap not indicated today, or in the future, as long as she does not have any VB or spotting  Aletha Halim, Brooke Bonito MD Attending Center for [employer] (Faculty Practice) 04/07/2020

## 2020-04-09 DIAGNOSIS — Z6841 Body Mass Index (BMI) 40.0 and over, adult: Secondary | ICD-10-CM | POA: Diagnosis not present

## 2020-04-09 DIAGNOSIS — D6861 Antiphospholipid syndrome: Secondary | ICD-10-CM | POA: Diagnosis not present

## 2020-04-09 DIAGNOSIS — M0579 Rheumatoid arthritis with rheumatoid factor of multiple sites without organ or systems involvement: Secondary | ICD-10-CM | POA: Diagnosis not present

## 2020-04-09 DIAGNOSIS — R5382 Chronic fatigue, unspecified: Secondary | ICD-10-CM | POA: Diagnosis not present

## 2020-04-09 DIAGNOSIS — I890 Lymphedema, not elsewhere classified: Secondary | ICD-10-CM | POA: Diagnosis not present

## 2020-04-09 DIAGNOSIS — M2550 Pain in unspecified joint: Secondary | ICD-10-CM | POA: Diagnosis not present

## 2020-04-09 DIAGNOSIS — E559 Vitamin D deficiency, unspecified: Secondary | ICD-10-CM | POA: Diagnosis not present

## 2020-04-14 ENCOUNTER — Other Ambulatory Visit: Payer: Self-pay

## 2020-04-14 ENCOUNTER — Encounter: Payer: Self-pay | Admitting: Nurse Practitioner

## 2020-04-14 ENCOUNTER — Ambulatory Visit: Payer: Medicare HMO | Attending: Nurse Practitioner | Admitting: Nurse Practitioner

## 2020-04-14 VITALS — BP 134/78 | HR 97 | Temp 98.5°F | Ht 72.0 in | Wt 358.0 lb

## 2020-04-14 DIAGNOSIS — N3945 Continuous leakage: Secondary | ICD-10-CM | POA: Diagnosis not present

## 2020-04-14 DIAGNOSIS — R000 Tachycardia, unspecified: Secondary | ICD-10-CM | POA: Diagnosis not present

## 2020-04-14 DIAGNOSIS — R7303 Prediabetes: Secondary | ICD-10-CM

## 2020-04-14 DIAGNOSIS — Z86711 Personal history of pulmonary embolism: Secondary | ICD-10-CM

## 2020-04-14 DIAGNOSIS — I890 Lymphedema, not elsewhere classified: Secondary | ICD-10-CM | POA: Diagnosis not present

## 2020-04-14 DIAGNOSIS — N393 Stress incontinence (female) (male): Secondary | ICD-10-CM

## 2020-04-14 LAB — GLUCOSE, POCT (MANUAL RESULT ENTRY): POC Glucose: 146 mg/dl — AB (ref 70–99)

## 2020-04-14 MED ORDER — FOLIC ACID 1 MG PO TABS: 1.0000 mg | ORAL_TABLET | Freq: Every day | ORAL | 3 refills | Status: DC

## 2020-04-14 MED ORDER — CARVEDILOL 6.25 MG PO TABS: 6.2500 mg | ORAL_TABLET | Freq: Two times a day (BID) | ORAL | 2 refills | Status: DC

## 2020-04-14 MED ORDER — APIXABAN 5 MG PO TABS: ORAL_TABLET | ORAL | 3 refills | Status: DC

## 2020-04-14 NOTE — Progress Notes (Signed)
.   Assessment & Plan:  [name] was seen today for follow-up.  Diagnoses and all orders for this visit:  Prediabetes -     Glucose (CBG)  Continuous leakage of urine -     Ambulatory referral to Urology  Sinus tachycardia -     carvedilol (COREG) 6.25 MG tablet; Take 1 tablet (6.25 mg total) by mouth 2 (two) times daily with a meal.  History of pulmonary embolus (PE) -     apixaban (ELIQUIS) 5 MG TABS tablet; TAKE 1 TABLET (5 MG TOTAL) BY MOUTH 2 (TWO) TIMES DAILY.  Lymphedema -     AMB referral to rehabilitation  Other orders -     folic acid (FOLVITE) 1 MG tablet; Take 1 tablet (1 mg total) by mouth daily.    Patient has been counseled on age-appropriate routine health concerns for screening and prevention. These are reviewed and up-to-date. Referrals have been placed accordingly. Immunizations are up-to-date or declined.    Subjective:   Chief Complaint  Patient presents with  . Follow-up    Patient is here for a follow up.    HPI [name] 55 y.o. female presents to office today  fo follow up She has a past medical history of Anemia, Anxiety, OA,  Asthma,  Depression (08/2010), Uterine fibroids, Keloid, Lymphedema, and Pulmonary embolism.  Overall she is doing well today.    Incontinence Still with issues of incontinence. Insurance would not cover the catheters that were ordered. Symptoms have been present for a few years. She leaks urine involuntarily without regard to any activity. Patient describes the symptoms as  a blunted sense of urge to urinate, urge to urinate with little or no warning, urine leakage with coughing/heavy physical activity and urine leaking unpredictably.  Factors associated with symptoms include associated with menopause. Evaluation to date includes UA/CS: normal.  Prediabetes Well controlled at this time without the use of oral diabetic agents.  Lab Results  Component Value Date   HGBA1C 5.8 (H) 01/16/2020    She has  significant chronic  elephantiasis of BLE. She is being followed by podiatry however would welcome additional evaluation by lymphedema clini at [facility].    Review of Systems  Constitutional: Negative for fever, malaise/fatigue and weight loss.  HENT: Negative.  Negative for nosebleeds.   Eyes: Negative.  Negative for blurred vision, double vision and photophobia.  Respiratory: Negative.  Negative for cough and shortness of breath.   Cardiovascular: Positive for leg swelling (BLE ). Negative for chest pain and palpitations.  Gastrointestinal: Negative.  Negative for heartburn, nausea and vomiting.  Genitourinary:       SEE HPI  Musculoskeletal: Negative.  Negative for myalgias.  Neurological: Negative.  Negative for dizziness, focal weakness, seizures and headaches.  Psychiatric/Behavioral: Negative.  Negative for suicidal ideas.    Past Medical History:  Diagnosis Date  . Anemia   . Anxiety   . Arthritis   . Asthma    hx as child - no prob as adult - no inhaler  . Blood transfusion 01/22/11   transfusion 2 units at [facility]  . Depression 08/2010   psych assessment  . Dyspnea   . Fibroid   . Headache(784.0)    rx for imitrex - last one jan  . Keloid   . Lymphedema   . Pulmonary embolism [facility])     Past Surgical History:  Procedure Laterality Date  . ABDOMINAL HYSTERECTOMY    . COLONOSCOPY N/A 11/08/2017   Procedure: COLONOSCOPY;  Surgeon: Danie Binder, MD;  [facility];  Service: Endoscopy;  Laterality: N/A;  2:00pm  . FLEXIBLE SIGMOIDOSCOPY N/A 09/17/2017   Procedure: FLEXIBLE SIGMOIDOSCOPY;  Surgeon: Danie Binder, MD;  [facility];  Service: Endoscopy;  Laterality: N/A;  . HERNIA REPAIR  [id number]   umbilical  . POLYPECTOMY  11/08/2017   Procedure: POLYPECTOMY;  Surgeon: Danie Binder, MD;  [facility];  Service: Endoscopy;;  ascending colon (CSx1), transverse colon (CS x1), splenic flexure (HSx1)  . SVD     Spontaneous vaginal delivery; x 1     Family History  Problem Relation Age of Onset  . Heart disease Mother   . Hypertension Mother   . Hypertension Father   . Colon cancer Father 84       Passed away 4 yrs old  . Hypertension Sister   . Cancer Maternal Grandmother        gastric cancer  . Cancer Maternal Grandfather        pancreatic cancer  . Colon cancer Paternal Grandmother   . Colon cancer Paternal Uncle   . Colon polyps Neg Hx     Social History Reviewed with no changes to be made today.   Outpatient Medications Prior to Visit  Medication Sig Dispense Refill  . Methotrexate 25 MG/ML SOSY Inject into the skin.    . Methotrexate Sodium (METHOTREXATE, PF,) 50 MG/2ML injection 20 mg once a week.    . Misc. Devices (BARIATRIC ROLLATOR) MISC Please provide patient with insurance approved bariatric rollator walker. I89.0, R53.81, R26.81 1 each 0  . Vitamin D, Ergocalciferol, (DRISDOL) 1.25 MG (50000 UNIT) CAPS capsule Take 1 capsule (50,000 Units total) by mouth every 7 (seven) days. 12 capsule 1  . Vitamins A & D (VITAMIN A & D) ointment Apply 1 application topically daily as needed for dry skin.    Marland Kitchen apixaban (ELIQUIS) 5 MG TABS tablet TAKE 1 TABLET (5 MG TOTAL) BY MOUTH 2 (TWO) TIMES DAILY. 60 tablet 3  . carvedilol (COREG) 6.25 MG tablet Take 1 tablet (6.25 mg total) by mouth 2 (two) times daily with a meal. 60 tablet 2  . folic acid (FOLVITE) 1 MG tablet Take 1 mg by mouth daily.    Marland Kitchen acetaminophen (TYLENOL) 325 MG tablet Take 2 tablets (650 mg total) by mouth every 6 (six) hours as needed for mild pain (or Fever >/= 101). (Patient not taking: No sig reported) 60 tablet 0  . Adalimumab (HUMIRA PEN) 40 MG/0.4ML PNKT Inject into the skin. Every 2 weeks (Patient not taking: Reported on 04/14/2020)    . Catheters (SELF-CATH CLOSED SYSTEM FEMALE) KIT Please provide patient with [place] starter kit, which includes the PureWick Urine Collection System and 1 box of 30 PureWick Female External Catheters. ICD  10N39.3 (Patient not taking: Reported on 04/14/2020) 1 kit prn   No facility-administered medications prior to visit.    Allergies  Allergen Reactions  . Bee Venom Anaphylaxis  . Augmentin [Amoxicillin-Pot Clavulanate] Hives, Itching and Other (See Comments)    Did PCN reaction causing immediate rash, facial/tongue/throat swelling, SOB or lightheadedness with hypotension: yes Did PCN reaction causing severe rash involving mucus membranes or skin necrosis: no Has patient had a PCN reaction that required hospitalization: in hospital Has patient had a PCN reaction occurring within the last 10 years: no If all of the above answers are "NO", then may proceed with Cephalosporin use.  Pt reports no recollection of any  reactions when taking penicillin in past  . Latex Hives and Other (See Comments)    Local reaction (welts). Patient denies any wheezing or other reaction with latex exposure  . Nulytely [Peg 3350-Kcl-Na Bicarb-Nacl]     NAUSEA AND VOMITING. MAY TOLERATE LOW VOLUME PREP.       Objective:    BP 134/78 (BP Location: Right Arm, Patient Position: Sitting, Cuff Size: Large)   Pulse 97   Temp 98.5 F (36.9 C) (Oral)   Ht 6' (1.829 m)   Wt (!) 358 lb (162.4 kg)   LMP 01/31/2011   SpO2 96%   BMI 48.55 kg/m  Wt Readings from Last 3 Encounters:  04/14/20 (!) 358 lb (162.4 kg)  10/13/19 (!) 321 lb (145.6 kg)  01/06/19 272 lb 4.3 oz (123.5 kg)    Physical Exam Vitals and nursing note reviewed.  Constitutional:      Appearance: She is well-developed and well-nourished.  HENT:     Head: Normocephalic and atraumatic.  Eyes:     Extraocular Movements: EOM normal.  Cardiovascular:     Rate and Rhythm: Normal rate and regular rhythm.     Pulses: Intact distal pulses.     Heart sounds: Normal heart sounds. No murmur heard. No friction rub. No gallop.   Pulmonary:     Effort: Pulmonary effort is normal. No tachypnea or respiratory distress.     Breath sounds: Normal breath  sounds. No decreased breath sounds, wheezing, rhonchi or rales.  Chest:     Chest wall: No tenderness.  Abdominal:     General: Bowel sounds are normal.     Palpations: Abdomen is soft.  Musculoskeletal:        General: No edema. Normal range of motion.     Cervical back: Normal range of motion.  Skin:    General: Skin is warm and dry.  Neurological:     Mental Status: She is alert and oriented to person, place, and time.     Coordination: Coordination normal.     Comments: Using walker due to significant BLE deformity/elephantiasis  Psychiatric:        Mood and Affect: Mood and affect normal.        Behavior: Behavior normal. Behavior is cooperative.        Thought Content: Thought content normal.        Judgment: Judgment normal.          Patient has been counseled extensively about nutrition and exercise as well as the importance of adherence with medications and regular follow-up. The patient was given clear instructions to go to ER or return to medical center if symptoms don't improve, worsen or new problems develop. The patient verbalized understanding.   Follow-up: Return in about 3 months (around 07/12/2020).   Gildardo Pounds, FNP-BC [facility] and [facility], [place]   04/18/2020, 2:06 PM

## 2020-04-15 ENCOUNTER — Telehealth: Payer: Self-pay

## 2020-04-15 NOTE — Telephone Encounter (Signed)
Call placed to patient and completed SCAT application and then faxed to [employer] [place] [employer].

## 2020-04-18 ENCOUNTER — Encounter: Payer: Self-pay | Admitting: Nurse Practitioner

## 2020-04-22 ENCOUNTER — Ambulatory Visit
Admission: EM | Admit: 2020-04-22 | Discharge: 2020-04-22 | Disposition: A | Discharge status: Home / Self Care | Payer: Medicare HMO

## 2020-04-22 ENCOUNTER — Ambulatory Visit: Payer: Self-pay | Admitting: *Deleted

## 2020-04-22 ENCOUNTER — Other Ambulatory Visit: Payer: Self-pay

## 2020-04-22 ENCOUNTER — Telehealth: Payer: Self-pay | Admitting: Nurse Practitioner

## 2020-04-22 DIAGNOSIS — L03032 Cellulitis of left toe: Secondary | ICD-10-CM

## 2020-04-22 MED ORDER — DOXYCYCLINE HYCLATE 100 MG PO CAPS: 100.0000 mg | ORAL_CAPSULE | Freq: Two times a day (BID) | ORAL | 0 refills | Status: AC

## 2020-04-22 MED ORDER — MUPIROCIN 2 % EX OINT: 1.0000 "application " | TOPICAL_OINTMENT | Freq: Two times a day (BID) | CUTANEOUS | 0 refills | Status: DC

## 2020-04-22 NOTE — Telephone Encounter (Signed)
Left great toe with infected nail cream colored discharge yesterday and today. Some swelling and redness. Unable to verify any red streaking due to her lymphedema. No unusual pain than her normal, she wears TEDS all the time. Although she does have pain with walking in shoes at this time. Cleaned with soap, water, alcohol today. Watery type discharge continues to ooze from the nail bed. No fever known. Advised to keep clean with soap and water with triple a ointment applied around nail bed for now. No availability tomorrow-attempted warm transfer to office, placed on lengthy hold. Informed the patient she would get a call back this afternoon regarding a possible appointment. Advised UC if she is unable to see pcp tomorrow.   Reason for Disposition . Yellow pus seen in skin around toenail (cuticle area), or pus seen under toenail  Answer Assessment - Initial Assessment Questions 1. ONSET: "When did the pain start?"      yesterday 2. LOCATION: "Where is the pain located?"   (e.g., around nail, entire toe, at foot joint)      Around the left big toe 3. PAIN: "How bad is the pain?"    (Scale 1-10; or mild, moderate, severe)   -  MILD (1-3): doesn't interfere with normal activities    -  MODERATE (4-7): interferes with normal activities (e.g., work or school) or awakens from sleep, limping    -  SEVERE (8-10): excruciating pain, unable to do any normal activities, unable to walk     Painful to walk on 4. APPEARANCE: "What does the toe look like?" (e.g., redness, swelling, bruising, pallor)     Unsure due to her lymphadema 5. CAUSE: "What do you think is causing the toe pain?"     The nail 6. OTHER SYMPTOMS: "Do you have any other symptoms?" (e.g., leg pain, rash, fever, numbness)     unsure 7. PREGNANCY: "Is there any chance you are pregnant?" "When was your last menstrual period?"     na  Protocols used: TOENAIL - [place]-A-AH, TOE PAIN-A-AH

## 2020-04-22 NOTE — Telephone Encounter (Signed)
Patient informed of the Mobile Unit operations until 5:30 today. Advised of UC on [employer]. She is unable to tell if she has fever d/t medication she takes.

## 2020-04-22 NOTE — Discharge Instructions (Signed)
Begin doxycycline twice daily for 1 week Elevate leg to help with circulation May soak in warm water twice daily, dry well and apply Bactroban around nail bed twice daily Follow-up if not improving or worsening

## 2020-04-22 NOTE — ED Provider Notes (Signed)
[facility]    CSN: [id number] Arrival date & time: 04/22/20  1739      History   Chief Complaint No chief complaint on file. Toe infection  HPI [name] is a 55 y.o. female history of lymphedema, presenting today for evaluation of left great toe infection.  Reports yesterday noticed her left great toe had pustular drainage as well as increased redness and swelling around the nailbed itself.  Denies any injury or trauma.  Reports sensation is decreased at baseline, but denies any significant pain.  Denies known fevers.  HPI  Past Medical History:  Diagnosis Date  . Anemia   . Anxiety   . Arthritis   . Asthma    hx as child - no prob as adult - no inhaler  . Blood transfusion 01/22/11   transfusion 2 units at [facility]  . Depression 08/2010   psych assessment  . Dyspnea   . Fibroid   . Headache(784.0)    rx for imitrex - last one jan  . Keloid   . Lymphedema   . Pulmonary embolism [facility])     Patient Active Problem List   Diagnosis Date Noted  . Cellulitis 12/31/2018  . Elephantiasis 12/31/2018  . Ischemic chest pain ([place]) 12/31/2018  . History of pulmonary embolus (PE) 12/31/2018  . Physical deconditioning 08/27/2018  . Adenopathy 09/15/2017  . Family history of colon cancer 08/01/2017  . Taking multiple medications for chronic disease 08/01/2017  . Arthritis   . Axillary lymphadenopathy 01/26/2017  . Urinary incontinence 01/25/2017  . Hypokalemia 01/25/2017  . Lobar pneumonia ([place]) 01/25/2017  . Pressure ulcer of sacral region, stage 2 ([place]) 01/25/2017  . Abnormal ECG 01/29/2013  . Dyspnea   . Menorrhagia with regular cycle 02/22/2011  . Anemia 02/22/2011    Past Surgical History:  Procedure Laterality Date  . ABDOMINAL HYSTERECTOMY    . COLONOSCOPY N/A 11/08/2017   Procedure: COLONOSCOPY;  Surgeon: Danie Binder, MD;  [facility];  Service: Endoscopy;  Laterality: N/A;  2:00pm  . FLEXIBLE SIGMOIDOSCOPY N/A 09/17/2017    Procedure: FLEXIBLE SIGMOIDOSCOPY;  Surgeon: Danie Binder, MD;  [facility];  Service: Endoscopy;  Laterality: N/A;  . HERNIA REPAIR  1914   umbilical  . POLYPECTOMY  11/08/2017   Procedure: POLYPECTOMY;  Surgeon: Danie Binder, MD;  [facility];  Service: Endoscopy;;  ascending colon (CSx1), transverse colon (CS x1), splenic flexure (HSx1)  . SVD     Spontaneous vaginal delivery; x 1    OB History    Gravida  3   Para  1   Term  1   Preterm      AB  2   Living  1     SAB  1   IAB  1   Ectopic      Multiple      Live Births               Home Medications    Prior to Admission medications   Medication Sig Start Date End Date Taking? Authorizing Provider  doxycycline (VIBRAMYCIN) 100 MG capsule Take 1 capsule (100 mg total) by mouth 2 (two) times daily for 7 days. 04/22/20 04/29/20 Yes Othel Dicostanzo C, PA-C  mupirocin ointment (BACTROBAN) 2 % Apply 1 application topically 2 (two) times daily. 04/22/20  Yes Margene Cherian C, PA-C  acetaminophen (TYLENOL) 325 MG tablet Take 2 tablets (650 mg total) by mouth every 6 (six)  hours as needed for mild pain (or Fever >/= 101). Patient not taking: No sig reported 01/06/19   Shelly Coss, MD  Adalimumab (HUMIRA PEN) 40 MG/0.4ML PNKT Inject into the skin. Every 2 weeks Patient not taking: Reported on 04/14/2020    [provider]  apixaban (ELIQUIS) 5 MG TABS tablet TAKE 1 TABLET (5 MG TOTAL) BY MOUTH 2 (TWO) TIMES DAILY. 04/14/20   Gildardo Pounds, NP  carvedilol (COREG) 6.25 MG tablet Take 1 tablet (6.25 mg total) by mouth 2 (two) times daily with a meal. 04/14/20   Gildardo Pounds, NP  folic acid (FOLVITE) 1 MG tablet Take 1 tablet (1 mg total) by mouth daily. 04/14/20 07/13/20  Gildardo Pounds, NP  Methotrexate 25 MG/ML SOSY Inject into the skin.    [provider]  Methotrexate Sodium (METHOTREXATE, PF,) 50 MG/2ML injection 20 mg once a week. 10/09/19   [provider]   Misc. Devices (BARIATRIC ROLLATOR) MISC Please provide patient with insurance approved bariatric rollator walker. I89.0, R53.81, R26.81 11/05/19   Gildardo Pounds, NP  ORENCIA CLICKJECT 834 MG/ML SOAJ Inject into the skin. 04/15/20   [provider]  Vitamin D, Ergocalciferol, (DRISDOL) 1.25 MG (50000 UNIT) CAPS capsule Take 1 capsule (50,000 Units total) by mouth every 7 (seven) days. 10/16/19   Gildardo Pounds, NP  Vitamins A & D (VITAMIN A & D) ointment Apply 1 application topically daily as needed for dry skin.    [provider]    Family History Family History  Problem Relation Age of Onset  . Heart disease Mother   . Hypertension Mother   . Hypertension Father   . Colon cancer Father 52       Passed away 36 yrs old  . Hypertension Sister   . Cancer Maternal Grandmother        gastric cancer  . Cancer Maternal Grandfather        pancreatic cancer  . Colon cancer Paternal Grandmother   . Colon cancer Paternal Uncle   . Colon polyps Neg Hx     Social History Social History   Tobacco Use  . Smoking status: Never Smoker  . Smokeless tobacco: Never Used  Vaping Use  . Vaping Use: Never used  Substance Use Topics  . Alcohol use: No  . Drug use: No     Allergies   Bee venom, Augmentin [amoxicillin-pot clavulanate], Latex, and Nulytely [peg 3350-kcl-na bicarb-nacl]   Review of Systems Review of Systems  Constitutional: Negative for fatigue and fever.  HENT: Negative for mouth sores.   Eyes: Negative for visual disturbance.  Respiratory: Negative for shortness of breath.   Cardiovascular: Negative for chest pain.  Gastrointestinal: Negative for abdominal pain, nausea and vomiting.  Genitourinary: Negative for genital sores.  Musculoskeletal: Negative for arthralgias and joint swelling.  Skin: Positive for color change and wound. Negative for rash.  Neurological: Negative for dizziness, weakness, light-headedness and headaches.     Physical  Exam Triage Vital Signs ED Triage Vitals  Enc Vitals Group     BP 04/22/20 1826 136/86     Pulse Rate 04/22/20 1826 98     Resp 04/22/20 1826 16     Temp 04/22/20 1826 98.2 F (36.8 C)     Temp Source 04/22/20 1826 Oral     SpO2 04/22/20 1826 96 %     Weight --      Height --      Head Circumference --  Peak Flow --      Pain Score 04/22/20 1828 0     Pain Loc --      Pain Edu? --      Excl. in [place]? --    No data found.  Updated Vital Signs BP 136/86 (BP Location: Left Arm)   Pulse 98   Temp 98.2 F (36.8 C) (Oral)   Resp 16   LMP 01/31/2011   SpO2 96%   Visual Acuity Right Eye Distance:   Left Eye Distance:   Bilateral Distance:    Right Eye Near:   Left Eye Near:    Bilateral Near:     Physical Exam Vitals and nursing note reviewed.  Constitutional:      Appearance: She is well-developed and well-nourished.     Comments: No acute distress  HENT:     Head: Normocephalic and atraumatic.     Nose: Nose normal.  Eyes:     Conjunctiva/sclera: Conjunctivae normal.  Cardiovascular:     Rate and Rhythm: Normal rate.  Pulmonary:     Effort: Pulmonary effort is normal. No respiratory distress.  Abdominal:     General: There is no distension.  Musculoskeletal:        General: Normal range of motion.     Cervical back: Neck supple.     Comments: Lymphedema noted to bilateral lower legs  Skin:    General: Skin is warm and dry.     Comments: Left great toe with erythema swelling noted around nail bed with pustular drainage noted at cuticle  Neurological:     Mental Status: She is alert and oriented to person, place, and time.  Psychiatric:        Mood and Affect: Mood and affect normal.      UC Treatments / Results  Labs (all labs ordered are listed, but only abnormal results are displayed) Labs Reviewed - No data to display  EKG   Radiology No results found.  Procedures Procedures (including critical care time)  Medications Ordered in  UC Medications - No data to display  Initial Impression / Assessment and Plan / UC Course  I have reviewed the triage vital signs and the nursing notes.  Pertinent labs & imaging results that were available during my care of the patient were reviewed by me and considered in my medical decision making (see chart for details).     Left paronychia of great toe, has allergy to Augmentin, opting to place on doxycycline, Bactroban topically, elevation, swelling/lymphedema chronic.  Advised patient to continue to monitor for gradual solution, return if symptoms not improving or worsening.  Discussed nail care.  Discussed strict return precautions. Patient verbalized understanding and is agreeable with plan.  Final Clinical Impressions(s) / UC Diagnoses   Final diagnoses:  Paronychia of great toe, left     Discharge Instructions     Begin doxycycline twice daily for 1 week Elevate leg to help with circulation May soak in warm water twice daily, dry well and apply Bactroban around nail bed twice daily Follow-up if not improving or worsening    ED Prescriptions    Medication Sig Dispense Auth. Provider   doxycycline (VIBRAMYCIN) 100 MG capsule Take 1 capsule (100 mg total) by mouth 2 (two) times daily for 7 days. 14 capsule Ayona Yniguez C, PA-C   mupirocin ointment (BACTROBAN) 2 % Apply 1 application topically 2 (two) times daily. 30 g Calista Crain, [place] C, PA-C     PDMP not reviewed this  encounter.   Janith [place], [place] 04/22/20 1954

## 2020-04-22 NOTE — ED Triage Notes (Signed)
Patient presents to Urgent Care with complaints of possible infection to left toe noted yesterday. She noted clear drainage and some redness. Her PCP instructed her to be evaluated at the ED or urgent care.

## 2020-04-22 NOTE — Telephone Encounter (Signed)
Pt called to speak to [name] about what she needs to do /Pt states she thinks she has an infection in her left foot big toe and she has lymphedema in this same foot/ she is waiting to hear back from her podiatrist and called [name] due to being on Methotrexate and a different kind of med for her arthritis / she was advised to contact provider if she has any infections / toes is leaking a cottage cheese like substance

## 2020-05-10 ENCOUNTER — Emergency Department (HOSPITAL_COMMUNITY)
Admission: EM | Admit: 2020-05-10 | Discharge: 2020-05-10 | Disposition: A | Discharge status: Home / Self Care | Payer: Medicare HMO | Attending: Emergency Medicine | Admitting: Emergency Medicine

## 2020-05-10 ENCOUNTER — Encounter (HOSPITAL_COMMUNITY): Payer: Self-pay

## 2020-05-10 DIAGNOSIS — J45909 Unspecified asthma, uncomplicated: Secondary | ICD-10-CM | POA: Insufficient documentation

## 2020-05-10 DIAGNOSIS — Z889 Allergy status to unspecified drugs, medicaments and biological substances status: Secondary | ICD-10-CM

## 2020-05-10 DIAGNOSIS — Z7901 Long term (current) use of anticoagulants: Secondary | ICD-10-CM | POA: Insufficient documentation

## 2020-05-10 DIAGNOSIS — Z91040 Latex allergy status: Secondary | ICD-10-CM | POA: Insufficient documentation

## 2020-05-10 DIAGNOSIS — T7840XA Allergy, unspecified, initial encounter: Secondary | ICD-10-CM | POA: Diagnosis not present

## 2020-05-10 DIAGNOSIS — T7849XA Other allergy, initial encounter: Secondary | ICD-10-CM | POA: Insufficient documentation

## 2020-05-10 DIAGNOSIS — L299 Pruritus, unspecified: Secondary | ICD-10-CM | POA: Diagnosis not present

## 2020-05-10 DIAGNOSIS — R9431 Abnormal electrocardiogram [ECG] [EKG]: Secondary | ICD-10-CM | POA: Diagnosis not present

## 2020-05-10 MED ORDER — PREDNISONE 20 MG PO TABS
60.0000 mg | ORAL_TABLET | Freq: Once | ORAL | Status: AC
  Administered 2020-05-10: 60 mg via ORAL
  Filled 2020-05-10: qty 3

## 2020-05-10 MED ORDER — PREDNISONE 20 MG PO TABS: 20.0000 mg | ORAL_TABLET | Freq: Two times a day (BID) | ORAL | 0 refills | Status: DC

## 2020-05-10 NOTE — ED Provider Notes (Signed)
[facility] DEPT Provider Note   CSN: [id number] Arrival date & time: 05/10/20  1056     History Chief Complaint  Patient presents with  . Allergic Reaction  . Nausea    [name] is a 55 y.o. female.  HPI She presents for evaluation of itching which started after she took her third injection of Orencia, as treatment for rheumatoid arthritis.  The itching persisted overnight making it difficult to sleep.  This morning she called EMS because she was having trouble talking.  EMS arrived to her home and treated her with Benadryl, followed by transport.  She states she feels better at this time.  She had not previously taken an antihistamine, since using the injection.  She has similar reaction in the past with Augmentin.  She denies recent fever, chills, chest pain, weakness or dizziness.  There are no other known modifying factors.    Past Medical History:  Diagnosis Date  . Anemia   . Anxiety   . Arthritis   . Asthma    hx as child - no prob as adult - no inhaler  . Blood transfusion 01/22/11   transfusion 2 units at [facility]  . Depression 08/2010   psych assessment  . Dyspnea   . Fibroid   . Headache(784.0)    rx for imitrex - last one jan  . Keloid   . Lymphedema   . Pulmonary embolism [facility])     Patient Active Problem List   Diagnosis Date Noted  . Cellulitis 12/31/2018  . Elephantiasis 12/31/2018  . Ischemic chest pain ([place]) 12/31/2018  . History of pulmonary embolus (PE) 12/31/2018  . Physical deconditioning 08/27/2018  . Adenopathy 09/15/2017  . Family history of colon cancer 08/01/2017  . Taking multiple medications for chronic disease 08/01/2017  . Arthritis   . Axillary lymphadenopathy 01/26/2017  . Urinary incontinence 01/25/2017  . Hypokalemia 01/25/2017  . Lobar pneumonia ([place]) 01/25/2017  . Pressure ulcer of sacral region, stage 2 ([place]) 01/25/2017  . Abnormal ECG 01/29/2013  . Dyspnea   . Menorrhagia with regular  cycle 02/22/2011  . Anemia 02/22/2011    Past Surgical History:  Procedure Laterality Date  . ABDOMINAL HYSTERECTOMY    . COLONOSCOPY N/A 11/08/2017   Procedure: COLONOSCOPY;  Surgeon: Danie Binder, MD;  Location[facility];  Service: Endoscopy;  Laterality: N/A;  2:00pm  . FLEXIBLE SIGMOIDOSCOPY N/A 09/17/2017   Procedure: FLEXIBLE SIGMOIDOSCOPY;  Surgeon: Danie Binder, MD;  Location: [facility] ENDO SUITE;  Service: Endoscopy;  Laterality: N/A;  . HERNIA REPAIR  [id number]   umbilical  . POLYPECTOMY  11/08/2017   Procedure: POLYPECTOMY;  Surgeon: Danie Binder, MD;  Location: [facility] ENDO SUITE;  Service: Endoscopy;;  ascending colon (CSx1), transverse colon (CS x1), splenic flexure (HSx1)  . SVD     Spontaneous vaginal delivery; x 1     OB History    Gravida  3   Para  1   Term  1   Preterm      AB  2   Living  1     SAB  1   IAB  1   Ectopic      Multiple      Live Births              Family History  Problem Relation Age of Onset  . Heart disease Mother   . Hypertension Mother   . Hypertension Father   . Colon cancer  Father 55       Passed away 49 yrs old  . Hypertension Sister   . Cancer Maternal Grandmother        gastric cancer  . Cancer Maternal Grandfather        pancreatic cancer  . Colon cancer Paternal Grandmother   . Colon cancer Paternal Uncle   . Colon polyps Neg Hx     Social History   Tobacco Use  . Smoking status: Never Smoker  . Smokeless tobacco: Never Used  Vaping Use  . Vaping Use: Never used  Substance Use Topics  . Alcohol use: No  . Drug use: No    Home Medications Prior to Admission medications   Medication Sig Start Date End Date Taking? Authorizing Provider  predniSONE (DELTASONE) 20 MG tablet Take 1 tablet (20 mg total) by mouth 2 (two) times daily. 05/10/20  Yes Daleen Bo, MD  acetaminophen (TYLENOL) 325 MG tablet Take 2 tablets (650 mg total) by mouth every 6 (six) hours as needed for mild pain (or Fever >/=  101). Patient not taking: No sig reported 01/06/19   Shelly Coss, MD  Adalimumab (HUMIRA PEN) 40 MG/0.4ML PNKT Inject into the skin. Every 2 weeks Patient not taking: Reported on 04/14/2020    [provider]  apixaban (ELIQUIS) 5 MG TABS tablet TAKE 1 TABLET (5 MG TOTAL) BY MOUTH 2 (TWO) TIMES DAILY. 04/14/20   Gildardo Pounds, NP  carvedilol (COREG) 6.25 MG tablet Take 1 tablet (6.25 mg total) by mouth 2 (two) times daily with a meal. 04/14/20   Gildardo Pounds, NP  folic acid (FOLVITE) 1 MG tablet Take 1 tablet (1 mg total) by mouth daily. 04/14/20 07/13/20  Gildardo Pounds, NP  Methotrexate 25 MG/ML SOSY Inject into the skin.    [provider]  Methotrexate Sodium (METHOTREXATE, PF,) 50 MG/2ML injection 20 mg once a week. 10/09/19   [provider]  Misc. Devices (BARIATRIC ROLLATOR) MISC Please provide patient with insurance approved bariatric rollator walker. I89.0, R53.81, R26.81 11/05/19   Gildardo Pounds, NP  mupirocin ointment (BACTROBAN) 2 % Apply 1 application topically 2 (two) times daily. 04/22/20   Wieters, Hallie C, PA-C  ORENCIA CLICKJECT 706 MG/ML SOAJ Inject into the skin. 04/15/20   [provider]  Vitamin D, Ergocalciferol, (DRISDOL) 1.25 MG (50000 UNIT) CAPS capsule Take 1 capsule (50,000 Units total) by mouth every 7 (seven) days. 10/16/19   Gildardo Pounds, NP  Vitamins A & D (VITAMIN A & D) ointment Apply 1 application topically daily as needed for dry skin.    [provider]    Allergies    Bee venom, Augmentin [amoxicillin-pot clavulanate], Latex, and Nulytely [peg 2376-EGB-TD bicarb-nacl]  Review of Systems   Review of Systems  All other systems reviewed and are negative.   Physical Exam Updated Vital Signs BP (!) 155/82   Pulse 76   Temp 98.9 F (37.2 C) (Oral)   Resp 20   Ht 6' (1.829 m)   Wt (!) 156.9 kg   LMP 01/31/2011   SpO2 97%   BMI 46.93 kg/m   Physical Exam Vitals and nursing note reviewed.   Constitutional:      General: She is not in acute distress.    Appearance: She is well-developed. She is obese. She is not ill-appearing, toxic-appearing or diaphoretic.  HENT:     Head: Normocephalic and atraumatic.     Right Ear: External ear normal.  Left Ear: External ear normal.     Nose: Nose normal.     Mouth/Throat:     Mouth: Mucous membranes are moist.     Comments: No oral or lingual angioedema.  No trismus. Eyes:     Conjunctiva/sclera: Conjunctivae normal.     Pupils: Pupils are equal, round, and reactive to light.  Neck:     Trachea: Phonation normal.  Cardiovascular:     Rate and Rhythm: Normal rate and regular rhythm.     Heart sounds: Normal heart sounds.  Pulmonary:     Effort: Pulmonary effort is normal. No respiratory distress.     Breath sounds: Normal breath sounds. No stridor.  Abdominal:     General: There is no distension.  Musculoskeletal:        General: Normal range of motion.     Cervical back: Normal range of motion and neck supple.  Skin:    General: Skin is warm and dry.  Neurological:     Mental Status: She is alert and oriented to person, place, and time.     Cranial Nerves: No cranial nerve deficit.     Sensory: No sensory deficit.     Motor: No abnormal muscle tone.     Coordination: Coordination normal.  Psychiatric:        Mood and Affect: Mood normal.        Behavior: Behavior normal.        Thought Content: Thought content normal.        Judgment: Judgment normal.     ED Results / Procedures / Treatments   Labs (all labs ordered are listed, but only abnormal results are displayed) Labs Reviewed - No data to display  EKG EKG Interpretation  Date/Time:  Monday May 10 2020 11:30:20 EDT Ventricular Rate:  84 PR Interval:    QRS Duration: 120 QT Interval:  400 QTC Calculation: 473 R Axis:   -61 Text Interpretation: Sinus rhythm Left anterior fascicular block Left ventricular hypertrophy Anterior Q waves, possibly due  to LVH ST elevation, consider inferior injury since last tracing no significant change Confirmed by Daleen Bo [phone]) on 05/10/2020 11:50:25 AM   Radiology No results found.  Procedures Procedures   Medications Ordered in ED Medications  predniSONE (DELTASONE) tablet 60 mg (60 mg Oral Given 05/10/20 1158)    ED Course  I have reviewed the triage vital signs and the nursing notes.  Pertinent labs & imaging results that were available during my care of the patient were reviewed by me and considered in my medical decision making (see chart for details).    MDM Rules/Calculators/A&P                           Patient Vitals for the past 24 hrs:  BP Temp Temp src Pulse Resp SpO2 Height Weight  05/10/20 1230 (!) 155/82 -- -- 76 20 97 % -- --  05/10/20 1135 (!) 152/78 -- -- 88 17 96 % -- --  05/10/20 1130 -- -- -- 84 15 96 % -- --  05/10/20 1126 -- -- -- -- -- -- 6' (1.829 m) (!) 156.9 kg  05/10/20 1125 -- -- -- -- -- 97 % -- --  05/10/20 1110 -- -- -- -- -- -- 6' (1.829 m) (!) 163.3 kg  05/10/20 1106 (!) 169/94 98.9 F (37.2 C) Oral (!) 103 20 98 % -- --    1:21 PM Reevaluation with update and  discussion. After initial assessment and treatment, an updated evaluation reveals she states that she is feeling better at this time and has no further complaints.  Findings discussed, questions answered. Daleen Bo   Medical Decision Making:  This patient is presenting for evaluation of allergic reaction, which does require a range of treatment options, and is a complaint that involves a moderate risk of morbidity and mortality. The differential diagnoses include anaphylaxis, allergic reaction, medication intolerance. I decided to review old records, and in summary middle-aged female presenting with pruritus after taking a third dose of Orencia.  No evidence for anaphylaxis on arrival.  I did not require additional historical information from anyone.     Critical  Interventions-clinical evaluation, prednisone, observation and reassess  After These Interventions, the Patient was reevaluated and was found stable for discharge.  Apparent allergy to rheumatologic agent, Orencia.  Allergy limited to itching with a sensation of throat swelling but no evident oral angioedema or generalized allergic reaction, or anaphylaxis.  Patient stable for discharge with outpatient management.  She understands that she is to stop taking the Orencia.  CRITICAL CARE-no Performed by: Daleen Bo  Nursing Notes Reviewed/ Care Coordinated Applicable Imaging Reviewed Interpretation of Laboratory Data incorporated into ED treatment  The patient appears reasonably screened and/or stabilized for discharge and I doubt any other medical condition or other [facility] requiring further screening, evaluation, or treatment in the ED at this time prior to discharge.  Plan: Home Medications-continue medications with the exception of Orencia.  Use Benadryl 4 times daily for itching; Home Treatments-rest, fluids; return here if the recommended treatment, does not improve the symptoms; Recommended follow up-PCP and rheumatology as needed.     Final Clinical Impression(s) / ED Diagnoses Final diagnoses:  Drug allergy    Rx / DC Orders ED Discharge Orders         Ordered    predniSONE (DELTASONE) 20 MG tablet  2 times daily        05/10/20 1322           Daleen Bo, MD 05/10/20 1324

## 2020-05-10 NOTE — ED Notes (Signed)
EDP made aware of patient's symptoms 

## 2020-05-10 NOTE — ED Triage Notes (Signed)
Patient arrived via [facility] from home.    Patient reports taking a shot for RA after taking it 3x. Patient reports itching, throat swelling, and allergic reaction from medication.    Per EMS no signs of hives. Now patient is c/o dizziness similar to vertigo.   New Onset Nausea once arrived to ED.   Fire depart. Gave 50mg  benadryl PO. Nothing else given at this time.   98% RA HR-92 regular  BP-151/78 CBG-119 RR-16 Lungs clear per ems   Patient ambulatory with ems at home but states she stays in bed a lot  Hx: DVT on eliquis MISSED her dose last night (3/13)

## 2020-05-10 NOTE — Discharge Instructions (Addendum)
Stop using the Orencia because you are probably allergic to it.  To treat the allergy we are prescribing prednisone which was sent to your pharmacy.  Start taking that medication this evening.  Also continue taking Benadryl, 25 to 50 mg, 4 times a day for itching or swelling sensation.  Call your rheumatologist to inform them of the drug allergy.  Return here, if needed.

## 2020-05-26 ENCOUNTER — Ambulatory Visit: Payer: Medicare HMO | Attending: Nurse Practitioner | Admitting: Occupational Therapy

## 2020-05-29 ENCOUNTER — Other Ambulatory Visit: Payer: Self-pay

## 2020-06-03 ENCOUNTER — Other Ambulatory Visit: Payer: Self-pay | Admitting: Nurse Practitioner

## 2020-06-03 DIAGNOSIS — I890 Lymphedema, not elsewhere classified: Secondary | ICD-10-CM

## 2020-06-07 DIAGNOSIS — N3941 Urge incontinence: Secondary | ICD-10-CM | POA: Diagnosis not present

## 2020-06-15 ENCOUNTER — Telehealth: Payer: Self-pay

## 2020-06-15 NOTE — Telephone Encounter (Signed)
Call placed to [facility], spoke to Carleene Mains who confirmed that the patient has been approved for services

## 2020-07-07 DIAGNOSIS — Z6841 Body Mass Index (BMI) 40.0 and over, adult: Secondary | ICD-10-CM | POA: Diagnosis not present

## 2020-07-07 DIAGNOSIS — D6861 Antiphospholipid syndrome: Secondary | ICD-10-CM | POA: Diagnosis not present

## 2020-07-07 DIAGNOSIS — M0579 Rheumatoid arthritis with rheumatoid factor of multiple sites without organ or systems involvement: Secondary | ICD-10-CM | POA: Diagnosis not present

## 2020-07-07 DIAGNOSIS — M2550 Pain in unspecified joint: Secondary | ICD-10-CM | POA: Diagnosis not present

## 2020-07-07 DIAGNOSIS — I890 Lymphedema, not elsewhere classified: Secondary | ICD-10-CM | POA: Diagnosis not present

## 2020-07-07 DIAGNOSIS — R5382 Chronic fatigue, unspecified: Secondary | ICD-10-CM | POA: Diagnosis not present

## 2020-07-07 DIAGNOSIS — E559 Vitamin D deficiency, unspecified: Secondary | ICD-10-CM | POA: Diagnosis not present

## 2020-07-08 DIAGNOSIS — R351 Nocturia: Secondary | ICD-10-CM | POA: Diagnosis not present

## 2020-07-08 DIAGNOSIS — N3941 Urge incontinence: Secondary | ICD-10-CM | POA: Diagnosis not present

## 2020-07-12 ENCOUNTER — Other Ambulatory Visit: Payer: Self-pay

## 2020-07-12 ENCOUNTER — Encounter: Payer: Self-pay | Admitting: Nurse Practitioner

## 2020-07-12 ENCOUNTER — Ambulatory Visit: Payer: Medicare HMO | Attending: Nurse Practitioner | Admitting: Nurse Practitioner

## 2020-07-12 ENCOUNTER — Other Ambulatory Visit: Payer: Self-pay | Admitting: Nurse Practitioner

## 2020-07-12 VITALS — BP 130/82 | HR 87 | Resp 16 | Wt 363.0 lb

## 2020-07-12 DIAGNOSIS — E785 Hyperlipidemia, unspecified: Secondary | ICD-10-CM | POA: Diagnosis not present

## 2020-07-12 DIAGNOSIS — R7303 Prediabetes: Secondary | ICD-10-CM

## 2020-07-12 DIAGNOSIS — N760 Acute vaginitis: Secondary | ICD-10-CM

## 2020-07-12 DIAGNOSIS — D649 Anemia, unspecified: Secondary | ICD-10-CM | POA: Diagnosis not present

## 2020-07-12 DIAGNOSIS — E876 Hypokalemia: Secondary | ICD-10-CM | POA: Diagnosis not present

## 2020-07-12 DIAGNOSIS — Z1231 Encounter for screening mammogram for malignant neoplasm of breast: Secondary | ICD-10-CM

## 2020-07-12 DIAGNOSIS — J4520 Mild intermittent asthma, uncomplicated: Secondary | ICD-10-CM

## 2020-07-12 MED ORDER — FLUCONAZOLE 150 MG PO TABS: 150.0000 mg | ORAL_TABLET | ORAL | 0 refills | Status: AC

## 2020-07-12 MED ORDER — ALBUTEROL SULFATE HFA 108 (90 BASE) MCG/ACT IN AERS: 2.0000 | INHALATION_SPRAY | Freq: Four times a day (QID) | RESPIRATORY_TRACT | 2 refills | Status: DC | PRN

## 2020-07-12 NOTE — Progress Notes (Signed)
Has a place on her that is very painful and itchy  x 4  days

## 2020-07-12 NOTE — Progress Notes (Signed)
Assessment & Plan:  [name] was seen today for follow-up.  Diagnoses and all orders for this visit:  Prediabetes -     Hemoglobin A1c  Dyslipidemia, goal LDL below 100 -     Lipid panel INSTRUCTIONS: Work on a low fat, heart healthy diet and participate in regular aerobic exercise program by working out at least 150 minutes per week; 5 days a week-30 minutes per day. Avoid red meat/beef/steak,  fried foods. junk foods, sodas, sugary drinks, unhealthy snacking, alcohol and smoking.  Drink at least 80 oz of water per day and monitor your carbohydrate intake daily.    Acute vaginitis -     fluconazole (DIFLUCAN) 150 MG tablet; Take 1 tablet (150 mg total) by mouth every 3 (three) days for 7 doses. -     Urinalysis, Complete -     CULTURE, URINE COMPREHENSIVE  Breast cancer screening by mammogram -     MM DIGITAL SCREENING BILATERAL; Future  Anemia, unspecified type -     CBC  Hypokalemia -     CMP14+EGFR  Mild intermittent asthma without complication -     albuterol (VENTOLIN HFA) 108 (90 Base) MCG/ACT inhaler; Inhale 2 puffs into the lungs every 6 (six) hours as needed for wheezing or shortness of breath.    Patient has been counseled on age-appropriate routine health concerns for screening and prevention. These are reviewed and up-to-date. Referrals have been placed accordingly. Immunizations are up-to-date or declined.    Subjective:   Chief Complaint  Patient presents with  . Follow-up   HPI [name] 55 y.o. female presents to office today for follow up. She has a past medical history of Anemia, Anxiety, OA,  Asthma, Urinary incontinence with foley in place,  Depression (08/2010), Uterine fibroids, Keloid, Lymphedema, and Pulmonary embolism.   Prediabetes Well controlled at this time without the use of any oral diabetic medications. LDL not at goal.  Lab Results  Component Value Date   HGBA1C 6.4 (H) 07/12/2020   Lab Results  Component Value Date    LDLCALC 117 (H) 07/12/2020   UTI symptoms She has a foley catheter in place. Notes burning and irritation in the vaginal area. Onset of symptoms a few weeks ago. She denies hematuria or flank pain.   Asthma Notes wheezing with increased activity. Has history of asthma. Denies any current cough, wheezing or shortness of breath.    Review of Systems  Constitutional: Negative for fever, malaise/fatigue and weight loss.  HENT: Negative.  Negative for nosebleeds.   Eyes: Negative.  Negative for blurred vision, double vision and photophobia.  Respiratory: Positive for wheezing. Negative for cough and shortness of breath.   Cardiovascular: Negative.  Negative for chest pain, palpitations and leg swelling.  Gastrointestinal: Negative.  Negative for heartburn, nausea and vomiting.  Genitourinary:       SEE HPI  Musculoskeletal: Negative.  Negative for myalgias.  Neurological: Negative.  Negative for dizziness, focal weakness, seizures and headaches.  Psychiatric/Behavioral: Negative.  Negative for suicidal ideas.    Past Medical History:  Diagnosis Date  . Anemia   . Anxiety   . Arthritis   . Asthma    hx as child - no prob as adult - no inhaler  . Blood transfusion 01/22/11   transfusion 2 units [facility]  . Depression 08/2010   psych assessment  . Dyspnea   . Fibroid   . Headache(784.0)    rx for imitrex - last one jan  .  Keloid   . Lymphedema   . Pulmonary embolism [facility])      Past Surgical History:  Procedure Laterality Date  . ABDOMINAL HYSTERECTOMY    . COLONOSCOPY N/A 11/08/2017   Procedure: COLONOSCOPY;  Surgeon: Danie Binder, MD;  [facility];  Service: Endoscopy;  Laterality: N/A;  2:00pm  . FLEXIBLE SIGMOIDOSCOPY N/A 09/17/2017   Procedure: FLEXIBLE SIGMOIDOSCOPY;  Surgeon: Danie Binder, MD;  [facility];  Service: Endoscopy;  Laterality: N/A;  . HERNIA REPAIR  0768   umbilical  . POLYPECTOMY  11/08/2017   Procedure: POLYPECTOMY;  Surgeon:  Danie Binder, MD;  [facility];  Service: Endoscopy;;  ascending colon (CSx1), transverse colon (CS x1), splenic flexure (HSx1)  . SVD     Spontaneous vaginal delivery; x 1    Family History  Problem Relation Age of Onset  . Heart disease Mother   . Hypertension Mother   . Hypertension Father   . Colon cancer Father 46       Passed away 53 yrs old  . Hypertension Sister   . Cancer Maternal Grandmother        gastric cancer  . Cancer Maternal Grandfather        pancreatic cancer  . Colon cancer Paternal Grandmother   . Colon cancer Paternal Uncle   . Colon polyps Neg Hx     Social History Reviewed with no changes to be made today.   Outpatient Medications Prior to Visit  Medication Sig Dispense Refill  . acetaminophen (TYLENOL) 325 MG tablet Take 2 tablets (650 mg total) by mouth every 6 (six) hours as needed for mild pain (or Fever >/= 101). 60 tablet 0  . apixaban (ELIQUIS) 5 MG TABS tablet TAKE 1 TABLET (5 MG TOTAL) BY MOUTH 2 (TWO) TIMES DAILY. 60 tablet 3  . carvedilol (COREG) 6.25 MG tablet Take 1 tablet (6.25 mg total) by mouth 2 (two) times daily with a meal. 60 tablet 2  . folic acid (FOLVITE) 1 MG tablet TAKE 1 TABLET BY MOUTH BY ONCE A DAY 90 DAYS 90 tablet 1  . Methotrexate Sodium (METHOTREXATE, PF,) 50 MG/2ML injection 80 mg once a week.    . Misc. Devices (BARIATRIC ROLLATOR) MISC Please provide patient with insurance approved bariatric rollator walker. I89.0, R53.81, R26.81 1 each 0  . Vitamin D, Ergocalciferol, (DRISDOL) 1.25 MG (50000 UNIT) CAPS capsule TAKE 1 CAPSULE (50,000 UNITS TOTAL) BY MOUTH EVERY 7 (SEVEN) DAYS. 12 capsule 1  . Vitamins A & D (VITAMIN A & D) ointment Apply 1 application topically daily as needed for dry skin.    Marland Kitchen ORENCIA CLICKJECT 088 MG/ML SOAJ Inject into the skin.    . Adalimumab (HUMIRA PEN) 40 MG/0.4ML PNKT Inject into the skin. Every 2 weeks (Patient not taking: Reported on 04/14/2020)    . folic acid (FOLVITE) 1 MG  tablet Take 1 tablet (1 mg total) by mouth daily. 90 tablet 3  . Methotrexate 25 MG/ML SOSY Inject into the skin.    . mupirocin ointment (BACTROBAN) 2 % Apply 1 application topically 2 (two) times daily. 30 g 0  . predniSONE (DELTASONE) 20 MG tablet Take 1 tablet (20 mg total) by mouth 2 (two) times daily. (Patient not taking: Reported on 07/12/2020) 10 tablet 0   No facility-administered medications prior to visit.    Allergies  Allergen Reactions  . Bee Venom Anaphylaxis  . Orencia [Abatacept] Anaphylaxis  . Augmentin [Amoxicillin-Pot Clavulanate] Hives, Itching  and Other (See Comments)    Did PCN reaction causing immediate rash, facial/tongue/throat swelling, SOB or lightheadedness with hypotension: yes Did PCN reaction causing severe rash involving mucus membranes or skin necrosis: no Has patient had a PCN reaction that required hospitalization: in hospital Has patient had a PCN reaction occurring within the last 10 years: no If all of the above answers are "NO", then may proceed with Cephalosporin use.  Pt reports no recollection of any reactions when taking penicillin in past  . Latex Hives and Other (See Comments)    Local reaction (welts). Patient denies any wheezing or other reaction with latex exposure  . Nulytely [Peg 3350-Kcl-Na Bicarb-Nacl]     NAUSEA AND VOMITING. MAY TOLERATE LOW VOLUME PREP.       Objective:    BP 130/82 (BP Location: Left Arm)   Pulse 87   Resp 16   Wt (!) 363 lb (164.7 kg)   LMP 01/31/2011   SpO2 97%   BMI 49.23 kg/m  Wt Readings from Last 3 Encounters:  07/12/20 (!) 363 lb (164.7 kg)  05/10/20 (!) 346 lb (156.9 kg)  04/14/20 (!) 358 lb (162.4 kg)    Physical Exam Vitals and nursing note reviewed.  Constitutional:      Appearance: She is well-developed.  HENT:     Head: Normocephalic and atraumatic.  Cardiovascular:     Rate and Rhythm: Normal rate and regular rhythm.     Heart sounds: Normal heart sounds. No murmur heard. No  friction rub. No gallop.   Pulmonary:     Effort: Pulmonary effort is normal. No tachypnea or respiratory distress.     Breath sounds: Normal breath sounds. No decreased breath sounds, wheezing, rhonchi or rales.  Chest:     Chest wall: No tenderness.  Abdominal:     General: Bowel sounds are normal.     Palpations: Abdomen is soft.  Musculoskeletal:        General: Normal range of motion.     Cervical back: Normal range of motion.  Skin:    General: Skin is warm and dry.  Neurological:     Mental Status: She is alert and oriented to person, place, and time.     Coordination: Coordination normal.  Psychiatric:        Behavior: Behavior normal. Behavior is cooperative.        Thought Content: Thought content normal.        Judgment: Judgment normal.          Patient has been counseled extensively about nutrition and exercise as well as the importance of adherence with medications and regular follow-up. The patient was given clear instructions to go to ER or return to medical center if symptoms don't improve, worsen or new problems develop. The patient verbalized understanding.   Follow-up: Return in about 3 months (around 10/12/2020).   Gildardo Pounds, FNP-BC [facility], [place]   07/15/2020, 6:49 PM

## 2020-07-13 LAB — HEMOGLOBIN A1C
Est. average glucose Bld gHb Est-mCnc: 137 mg/dL
Hgb A1c MFr Bld: 6.4 % — ABNORMAL HIGH (ref 4.8–5.6)

## 2020-07-13 LAB — CMP14+EGFR
ALT: 17 IU/L (ref 0–32)
AST: 19 IU/L (ref 0–40)
Albumin/Globulin Ratio: 1.6 (ref 1.2–2.2)
Albumin: 4.8 g/dL (ref 3.8–4.9)
Alkaline Phosphatase: 75 IU/L (ref 44–121)
BUN/Creatinine Ratio: 9 (ref 9–23)
BUN: 8 mg/dL (ref 6–24)
Bilirubin Total: 1 mg/dL (ref 0.0–1.2)
CO2: 22 mmol/L (ref 20–29)
Calcium: 10 mg/dL (ref 8.7–10.2)
Chloride: 103 mmol/L (ref 96–106)
Creatinine, Ser: 0.9 mg/dL (ref 0.57–1.00)
Globulin, Total: 3 g/dL (ref 1.5–4.5)
Glucose: 113 mg/dL — ABNORMAL HIGH (ref 65–99)
Potassium: 4 mmol/L (ref 3.5–5.2)
Sodium: 141 mmol/L (ref 134–144)
Total Protein: 7.8 g/dL (ref 6.0–8.5)
eGFR: 77 mL/min/{1.73_m2} (ref >59)

## 2020-07-13 LAB — LIPID PANEL
Chol/HDL Ratio: 3.7 ratio (ref 0.0–4.4)
Cholesterol, Total: 185 mg/dL (ref 100–199)
HDL: 50 mg/dL (ref >39)
LDL Chol Calc (NIH): 117 mg/dL — ABNORMAL HIGH (ref 0–99)
Triglycerides: 100 mg/dL (ref 0–149)
VLDL Cholesterol Cal: 18 mg/dL (ref 5–40)

## 2020-07-13 LAB — CBC
Hematocrit: 37.1 % (ref 34.0–46.6)
Hemoglobin: 12 g/dL (ref 11.1–15.9)
MCH: 29.6 pg (ref 26.6–33.0)
MCHC: 32.3 g/dL (ref 31.5–35.7)
MCV: 91 fL (ref 79–97)
Platelets: 264 10*3/uL (ref 150–450)
RBC: 4.06 x10E6/uL (ref 3.77–5.28)
RDW: 14 % (ref 11.7–15.4)
WBC: 5.3 10*3/uL (ref 3.4–10.8)

## 2020-07-15 ENCOUNTER — Encounter: Payer: Self-pay | Admitting: Nurse Practitioner

## 2020-07-23 ENCOUNTER — Other Ambulatory Visit: Payer: Self-pay | Admitting: Nurse Practitioner

## 2020-07-23 LAB — CULTURE, URINE COMPREHENSIVE

## 2020-07-23 MED ORDER — SULFAMETHOXAZOLE-TRIMETHOPRIM 800-160 MG PO TABS
1.0000 | ORAL_TABLET | Freq: Two times a day (BID) | ORAL | 0 refills | Status: DC
Start: 2020-07-23 — End: 2020-07-27

## 2020-07-27 ENCOUNTER — Emergency Department (HOSPITAL_BASED_OUTPATIENT_CLINIC_OR_DEPARTMENT_OTHER): Payer: Medicare HMO

## 2020-07-27 ENCOUNTER — Other Ambulatory Visit (HOSPITAL_COMMUNITY): Payer: Self-pay | Admitting: Nurse Practitioner

## 2020-07-27 ENCOUNTER — Emergency Department (HOSPITAL_COMMUNITY)
Admission: EM | Admit: 2020-07-27 | Discharge: 2020-07-28 | Disposition: A | Discharge status: Home / Self Care | Payer: Medicare HMO | Attending: Pediatrics | Admitting: Pediatrics

## 2020-07-27 ENCOUNTER — Encounter (HOSPITAL_COMMUNITY): Payer: Self-pay | Admitting: *Deleted

## 2020-07-27 ENCOUNTER — Emergency Department (HOSPITAL_COMMUNITY): Payer: Medicare HMO

## 2020-07-27 DIAGNOSIS — M79661 Pain in right lower leg: Secondary | ICD-10-CM | POA: Diagnosis not present

## 2020-07-27 DIAGNOSIS — R0602 Shortness of breath: Secondary | ICD-10-CM | POA: Diagnosis not present

## 2020-07-27 DIAGNOSIS — M79606 Pain in leg, unspecified: Secondary | ICD-10-CM | POA: Diagnosis not present

## 2020-07-27 DIAGNOSIS — R079 Chest pain, unspecified: Secondary | ICD-10-CM | POA: Diagnosis not present

## 2020-07-27 DIAGNOSIS — J45909 Unspecified asthma, uncomplicated: Secondary | ICD-10-CM | POA: Diagnosis not present

## 2020-07-27 DIAGNOSIS — Y733 Surgical instruments, materials and gastroenterology and urology devices (including sutures) associated with adverse incidents: Secondary | ICD-10-CM | POA: Insufficient documentation

## 2020-07-27 DIAGNOSIS — Z91040 Latex allergy status: Secondary | ICD-10-CM | POA: Insufficient documentation

## 2020-07-27 DIAGNOSIS — T83511A Infection and inflammatory reaction due to indwelling urethral catheter, initial encounter: Secondary | ICD-10-CM | POA: Diagnosis present

## 2020-07-27 DIAGNOSIS — R2243 Localized swelling, mass and lump, lower limb, bilateral: Secondary | ICD-10-CM | POA: Insufficient documentation

## 2020-07-27 DIAGNOSIS — Z743 Need for continuous supervision: Secondary | ICD-10-CM | POA: Diagnosis not present

## 2020-07-27 DIAGNOSIS — Z7901 Long term (current) use of anticoagulants: Secondary | ICD-10-CM | POA: Diagnosis not present

## 2020-07-27 DIAGNOSIS — N390 Urinary tract infection, site not specified: Secondary | ICD-10-CM

## 2020-07-27 DIAGNOSIS — M7989 Other specified soft tissue disorders: Secondary | ICD-10-CM | POA: Diagnosis not present

## 2020-07-27 DIAGNOSIS — R0789 Other chest pain: Secondary | ICD-10-CM | POA: Diagnosis not present

## 2020-07-27 DIAGNOSIS — R609 Edema, unspecified: Secondary | ICD-10-CM | POA: Diagnosis not present

## 2020-07-27 DIAGNOSIS — R0600 Dyspnea, unspecified: Secondary | ICD-10-CM | POA: Diagnosis not present

## 2020-07-27 LAB — COMPREHENSIVE METABOLIC PANEL
ALT: 16 U/L (ref 0–44)
AST: 20 U/L (ref 15–41)
Albumin: 4.4 g/dL (ref 3.5–5.0)
Alkaline Phosphatase: 58 U/L (ref 38–126)
Anion gap: 6 (ref 5–15)
BUN: 10 mg/dL (ref 6–20)
CO2: 25 mmol/L (ref 22–32)
Calcium: 9.5 mg/dL (ref 8.9–10.3)
Chloride: 107 mmol/L (ref 98–111)
Creatinine, Ser: 0.86 mg/dL (ref 0.44–1.00)
GFR, Estimated: >60 mL/min (ref >60)
Glucose, Bld: 120 mg/dL — ABNORMAL HIGH (ref 70–99)
Potassium: 3.7 mmol/L (ref 3.5–5.1)
Sodium: 138 mmol/L (ref 135–145)
Total Bilirubin: 1.5 mg/dL — ABNORMAL HIGH (ref 0.3–1.2)
Total Protein: 8 g/dL (ref 6.5–8.1)

## 2020-07-27 LAB — CBC WITH DIFFERENTIAL/PLATELET
Abs Immature Granulocytes: 0 10*3/uL (ref 0.00–0.07)
Basophils Absolute: 0 10*3/uL (ref 0.0–0.1)
Basophils Relative: 0 %
Eosinophils Absolute: 0.2 10*3/uL (ref 0.0–0.5)
Eosinophils Relative: 5 %
HCT: 39.7 % (ref 36.0–46.0)
Hemoglobin: 12.3 g/dL (ref 12.0–15.0)
Immature Granulocytes: 0 %
Lymphocytes Relative: 20 %
Lymphs Abs: 0.9 10*3/uL (ref 0.7–4.0)
MCH: 29.5 pg (ref 26.0–34.0)
MCHC: 31 g/dL (ref 30.0–36.0)
MCV: 95.2 fL (ref 80.0–100.0)
Monocytes Absolute: 0.8 10*3/uL (ref 0.1–1.0)
Monocytes Relative: 17 %
Neutro Abs: 2.6 10*3/uL (ref 1.7–7.7)
Neutrophils Relative %: 58 %
Platelets: 262 10*3/uL (ref 150–400)
RBC: 4.17 MIL/uL (ref 3.87–5.11)
RDW: 15.5 % (ref 11.5–15.5)
WBC: 4.5 10*3/uL (ref 4.0–10.5)
nRBC: 0 % (ref 0.0–0.2)

## 2020-07-27 LAB — TROPONIN I (HIGH SENSITIVITY): Troponin I (High Sensitivity): 3 ng/L (ref <18)

## 2020-07-27 MED ORDER — SULFAMETHOXAZOLE-TRIMETHOPRIM 800-160 MG PO TABS
1.0000 | ORAL_TABLET | Freq: Once | ORAL | Status: AC
  Administered 2020-07-27: 1 via ORAL
  Filled 2020-07-27: qty 1

## 2020-07-27 MED ORDER — ELIQUIS 5 MG PO TABS: 5.0000 mg | ORAL_TABLET | Freq: Two times a day (BID) | ORAL | 0 refills | Status: DC

## 2020-07-27 MED ORDER — HYDROCODONE-ACETAMINOPHEN 5-325 MG PO TABS: 1.0000 | ORAL_TABLET | Freq: Four times a day (QID) | ORAL | 0 refills | Status: DC | PRN

## 2020-07-27 MED ORDER — SULFAMETHOXAZOLE-TRIMETHOPRIM 800-160 MG PO TABS: 1.0000 | ORAL_TABLET | Freq: Two times a day (BID) | ORAL | 0 refills | Status: AC

## 2020-07-27 MED ORDER — HYDROCODONE-ACETAMINOPHEN 5-325 MG PO TABS
2.0000 | ORAL_TABLET | Freq: Once | ORAL | Status: AC
Start: 2020-07-27 — End: 2020-07-27
  Administered 2020-07-27: 2 via ORAL
  Filled 2020-07-27: qty 2

## 2020-07-27 MED ORDER — APIXABAN 5 MG PO TABS
5.0000 mg | ORAL_TABLET | Freq: Once | ORAL | Status: AC
  Administered 2020-07-27: 5 mg via ORAL
  Filled 2020-07-27: qty 1

## 2020-07-27 NOTE — ED Provider Notes (Signed)
[facility] DEPT Provider Note   CSN: [id number] Arrival date & time: 07/27/20  1555     History Chief Complaint  Patient presents with  . Leg Pain  . Shortness of Breath    [name] is a 55 y.o. female.  Patient is a 55 year old female with past medical history of prior pulmonary embolism on Eliquis, lymphedema, asthma, arthritis, anemia.  Patient also having issues with urinary incontinence and has an indwelling Foley catheter.  Patient presenting today for evaluation of right calf pain and chest pain/difficulty breathing.  This has apparently worsened over the past several days.  Patient reports running out of her mail-order medications 5 days ago including Eliquis.  She is also waiting for Bactrim which was prescribed and female ordered, but has not arrived. This was prescribed by her primary doctor for a urinary tract infection from the indwelling catheter.  She feels as though her right leg is more swollen.  This began hurting in the absence of any injury or trauma.  The history is provided by the patient.  Leg Pain Lower extremity pain location: Right calf. Pain details:    Quality:  Aching   Radiates to:  Does not radiate   Severity:  Moderate   Onset quality:  Sudden   Duration:  2 days   Timing:  Constant   Progression:  Worsening Chronicity:  New      Past Medical History:  Diagnosis Date  . Anemia   . Anxiety   . Arthritis   . Asthma    hx as child - no prob as adult - no inhaler  . Blood transfusion 01/22/11   transfusion 2 units at [facility]  . Depression 08/2010   psych assessment  . Dyspnea   . Fibroid   . Headache(784.0)    rx for imitrex - last one jan  . Keloid   . Lymphedema   . Pulmonary embolism [facility])     Patient Active Problem List   Diagnosis Date Noted  . Cellulitis 12/31/2018  . Elephantiasis 12/31/2018  . Ischemic chest pain ([place]) 12/31/2018  . History of pulmonary embolus (PE) 12/31/2018  . Physical  deconditioning 08/27/2018  . Adenopathy 09/15/2017  . Family history of colon cancer 08/01/2017  . Taking multiple medications for chronic disease 08/01/2017  . Arthritis   . Axillary lymphadenopathy 01/26/2017  . Urinary incontinence 01/25/2017  . Hypokalemia 01/25/2017  . Lobar pneumonia ([place]) 01/25/2017  . Pressure ulcer of sacral region, stage 2 ([place]) 01/25/2017  . Abnormal ECG 01/29/2013  . Dyspnea   . Menorrhagia with regular cycle 02/22/2011  . Anemia 02/22/2011    Past Surgical History:  Procedure Laterality Date  . ABDOMINAL HYSTERECTOMY    . COLONOSCOPY N/A 11/08/2017   Procedure: COLONOSCOPY;  Surgeon: Danie Binder, MD;  Location: [facility] END[facility];  Service: Endoscopy;  Laterality: N/A;  2:00pm  . FLEXIBLE SIGMOIDOSCOPY N/A 09/17/2017   Procedure: FLEXIBLE SIGMOIDOSCOPY;  Surgeon: Danie Binder, MD;  Location: [facility] ENDO SUITE;  Service: Endoscopy;  Laterality: N/A;  . HERNIA REPAIR  [id number]   umbilical  . POLYPECTOMY  11/08/2017   Procedure: POLYPECTOMY;  Surgeon: Danie Binder, MD;  Location: [facility] END[facility] SUITE;  Service: Endoscopy;;  ascending colon (CSx1), transverse colon (CS x1), splenic flexure (HSx1)  . SVD     Spontaneous vaginal delivery; x 1     OB History    Gravida  3   Para  1   Term  1   Preterm      AB  2   Living  1     SAB  1   IAB  1   Ectopic      Multiple      Live Births              Family History  Problem Relation Age of Onset  . Heart disease Mother   . Hypertension Mother   . Hypertension Father   . Colon cancer Father 24       Passed away 63 yrs old  . Hypertension Sister   . Cancer Maternal Grandmother        gastric cancer  . Cancer Maternal Grandfather        pancreatic cancer  . Colon cancer Paternal Grandmother   . Colon cancer Paternal Uncle   . Colon polyps Neg Hx     Social History   Tobacco Use  . Smoking status: Never Smoker  . Smokeless tobacco: Never Used  Vaping Use  . Vaping Use: Never used   Substance Use Topics  . Alcohol use: No  . Drug use: No    Home Medications Prior to Admission medications   Medication Sig Start Date End Date Taking? Authorizing Provider  sulfamethoxazole-trimethoprim (BACTRIM DS) 800-160 MG tablet Take 1 tablet by mouth 2 (two) times daily for 14 days. 07/23/20 08/06/20  Gildardo Pounds, NP  acetaminophen (TYLENOL) 325 MG tablet Take 2 tablets (650 mg total) by mouth every 6 (six) hours as needed for mild pain (or Fever >/= 101). 01/06/19   Shelly Coss, MD  albuterol (VENTOLIN HFA) 108 (90 Base) MCG/ACT inhaler Inhale 2 puffs into the lungs every 6 (six) hours as needed for wheezing or shortness of breath. 07/12/20   Gildardo Pounds, NP  apixaban (ELIQUIS) 5 MG TABS tablet TAKE 1 TABLET (5 MG TOTAL) BY MOUTH 2 (TWO) TIMES DAILY. 04/14/20   Gildardo Pounds, NP  carvedilol (COREG) 6.25 MG tablet Take 1 tablet (6.25 mg total) by mouth 2 (two) times daily with a meal. 04/14/20   Gildardo Pounds, NP  fluconazole (DIFLUCAN) 150 MG tablet Take 1 tablet (150 mg total) by mouth every 3 (three) days for 7 doses. 07/12/20 07/31/20  Gildardo Pounds, NP  folic acid (FOLVITE) 1 MG tablet TAKE 1 TABLET BY MOUTH BY ONCE A DAY 90 DAYS 10/09/19 10/08/20  Bjorn Pippin, PA-C  Methotrexate Sodium (METHOTREXATE, PF,) 50 MG/2ML injection 80 mg once a week. 10/09/19   [provider]  Misc. Devices (BARIATRIC ROLLATOR) MISC Please provide patient with insurance approved bariatric rollator walker. I89.0, R53.81, R26.81 11/05/19   Gildardo Pounds, NP  ORENCIA CLICKJECT 829 MG/ML SOAJ Inject into the skin. 04/15/20   [provider]  Vitamin D, Ergocalciferol, (DRISDOL) 1.25 MG (50000 UNIT) CAPS capsule TAKE 1 CAPSULE (50,000 UNITS TOTAL) BY MOUTH EVERY 7 (SEVEN) DAYS. 10/16/19 10/15/20  Gildardo Pounds, NP  Vitamins A & D (VITAMIN A & D) ointment Apply 1 application topically daily as needed for dry skin.    [provider]    Allergies    Bee venom,  Orencia [abatacept], Augmentin [amoxicillin-pot clavulanate], Latex, and Nulytely [peg 5621-HYQ-MV bicarb-nacl]  Review of Systems   Review of Systems  All other systems reviewed and are negative.   Physical Exam Updated Vital Signs BP 136/77   Pulse 85   Temp 98.1 F (36.7 C) (Oral)   Resp 18   LMP  01/31/2011   SpO2 100%   Physical Exam Vitals and nursing note reviewed.  Constitutional:      General: She is not in acute distress.    Appearance: She is well-developed. She is not diaphoretic.  HENT:     Head: Normocephalic and atraumatic.  Cardiovascular:     Rate and Rhythm: Normal rate and regular rhythm.     Heart sounds: No murmur heard. No friction rub. No gallop.   Pulmonary:     Effort: Pulmonary effort is normal. No respiratory distress.     Breath sounds: Normal breath sounds. No wheezing.  Abdominal:     General: Bowel sounds are normal. There is no distension.     Palpations: Abdomen is soft.     Tenderness: There is no abdominal tenderness.  Musculoskeletal:        General: Normal range of motion.     Cervical back: Normal range of motion and neck supple.     Right lower leg: Edema present.     Left lower leg: Edema present.     Comments: Patient with chronic appearing lymphedema to both lower extremities.  There is tenderness to the right posterior inferior aspect of the calf.  There is no warmth or erythema.  Skin:    General: Skin is warm and dry.  Neurological:     Mental Status: She is alert and oriented to person, place, and time.     ED Results / Procedures / Treatments   Labs (all labs ordered are listed, but only abnormal results are displayed) Labs Reviewed  COMPREHENSIVE METABOLIC PANEL - Abnormal; Notable for the following components:      Result Value   Glucose, Bld 120 (*)    Total Bilirubin 1.5 (*)    All other components within normal limits  CBC WITH DIFFERENTIAL/PLATELET  TROPONIN I (HIGH SENSITIVITY)  TROPONIN I (HIGH  SENSITIVITY)    EKG EKG Interpretation  Date/Time:  Tuesday Jul 27 2020 23:27:27 EDT Ventricular Rate:  82 PR Interval:  174 QRS Duration: 122 QT Interval:  414 QTC Calculation: 484 R Axis:   -59 Text Interpretation: Sinus rhythm Nonspecific IVCD with LAD Left ventricular hypertrophy Inferior infarct, acute (RCA) Anterior Q waves, possibly due to LVH Probable RV involvement, suggest recording right precordial leads No significant change since 05/10/2020 Confirmed by Veryl Speak [phone]) on 07/27/2020 11:32:34 PM   Radiology DG Chest 2 View  Result Date: 07/27/2020 CLINICAL DATA:  Dyspnea and chest pain, right lower extremity pain for 2 days, history of PE EXAM: CHEST - 2 VIEW COMPARISON:  03/06/2018 chest radiograph. FINDINGS: Stable cardiomediastinal silhouette with normal heart size. No pneumothorax. No pleural effusion. Lungs appear clear, with no acute consolidative airspace disease and no pulmonary edema. IMPRESSION: No active cardiopulmonary disease. Electronically Signed   By: Ilona Sorrel M.D.   On: 07/27/2020 17:41   VAS [place] LOWER EXTREMITY VENOUS (DVT) (ONLY [facility] & [facility] 7a-7p)  Result Date: 07/27/2020  Lower Venous DVT Study Patient Name:  [name]  Date of Exam:   07/27/2020 Medical Rec #: [id number]          Accession #:    [id number] Date of Birth: Aug 13, 1965         Patient Gender: F Patient Age:   54Y Exam Location:  [facility] Procedure:      VAS [place] LOWER EXTREMITY VENOUS (DVT) Referring Phys: [id number] HINA KHATRI --------------------------------------------------------------------------------  Indications: Swelling, and Edema.  Comparison Study: 12/31/2018 prior Performing Technologist:  Abram Sander RVS  Examination Guidelines: A complete evaluation includes B-mode imaging, spectral Doppler, color Doppler, and power Doppler as needed of all accessible portions of each vessel. Bilateral testing is considered an integral part of a complete examination. Limited  examinations for reoccurring indications may be performed as noted. The reflux portion of the exam is performed with the patient in reverse Trendelenburg.  +---------+---------------+---------+-----------+----------+-------------------+ RIGHT    CompressibilityPhasicitySpontaneityPropertiesThrombus Aging      +---------+---------------+---------+-----------+----------+-------------------+ CFV      Full           Yes      Yes                                      +---------+---------------+---------+-----------+----------+-------------------+ SFJ      Full                                                             +---------+---------------+---------+-----------+----------+-------------------+ FV Prox  Full                                                             +---------+---------------+---------+-----------+----------+-------------------+ FV Mid   Full                                                             +---------+---------------+---------+-----------+----------+-------------------+ FV DistalFull                                                             +---------+---------------+---------+-----------+----------+-------------------+ PFV      Full                                                             +---------+---------------+---------+-----------+----------+-------------------+ POP      Full           Yes      Yes                                      +---------+---------------+---------+-----------+----------+-------------------+ PTV      Full                                                             +---------+---------------+---------+-----------+----------+-------------------+  PERO                                                  Not well visualized +---------+---------------+---------+-----------+----------+-------------------+   +----+---------------+---------+-----------+----------+--------------+  LEFTCompressibilityPhasicitySpontaneityPropertiesThrombus Aging +----+---------------+---------+-----------+----------+--------------+ CFV Full           Yes      Yes                                 +----+---------------+---------+-----------+----------+--------------+     Summary: RIGHT: - There is no evidence of deep vein thrombosis in the lower extremity.  - No cystic structure found in the popliteal fossa.  LEFT: - No evidence of common femoral vein obstruction.  *See table(s) above for measurements and observations. Electronically signed by Deitra Mayo MD on 07/27/2020 at 6:42:47 PM.    Final     Procedures Procedures   Medications Ordered in ED Medications  HYDROcodone-acetaminophen (NORCO/VICODIN) 5-325 MG per tablet 2 tablet (has no administration in time range)  sulfamethoxazole-trimethoprim (BACTRIM DS) 800-160 MG per tablet 1 tablet (has no administration in time range)  apixaban (ELIQUIS) tablet 5 mg (has no administration in time range)    ED Course  I have reviewed the triage vital signs and the nursing notes.  Pertinent labs & imaging results that were available during my care of the patient were reviewed by me and considered in my medical decision making (see chart for details).    MDM Rules/Calculators/A&P  Patient arrives here with the above complaints.  Work-up shows no evidence for pneumonia, a cardiac etiology, or DVT.  Ultrasound is negative and laboratory studies unremarkable.  Her troponin is negative.  I am uncertain as to the etiology of the calf pain, but I suspect musculoskeletal.  There is no warmth that would suggest cellulitis, but patient to be treated with Bactrim for her UTI.  Patient's chest pain is noncardiac sounding.  It is worse with breathing and moving.  Patient could potentially have a pulmonary embolism, however her vital signs are stable with no tachycardia, hypotension, or hypoxia.  Patient appears comfortable and I feel as  though resuming her Eliquis would be appropriate.  She is uncertain as to when this medication will arrive.  I will prescribe several days of this to bridge the gap until her mail-in prescriptions arrive.  Patient will also receive pain medication for her leg and is to follow-up with primary doctor if not improving in the next few days.  Final Clinical Impression(s) / ED Diagnoses Final diagnoses:  None    Rx / DC Orders ED Discharge Orders    None       Veryl Speak, MD 07/27/20 2335

## 2020-07-27 NOTE — Progress Notes (Signed)
Lower extremity venous has been completed.   Preliminary results in CV Proc.   Abram Sander 07/27/2020 5:06 PM

## 2020-07-27 NOTE — Discharge Instructions (Addendum)
Begin taking Bactrim as prescribed and hydrocodone as prescribed as needed for pain.  Resume Eliquis as previously prescribed.  Follow-up with primary doctor later this week, and return to the ER if symptoms significantly worsen or change.

## 2020-07-27 NOTE — ED Triage Notes (Signed)
Per EMS, pt complains of right lower leg pain x 2 days. Difficulty taking a deep breath since this morning. Hx of DVT and PE 3 years ago. Pt is on eliquis, has not taken since Wednesday of last week.   BP 158/58 HR 95 SpO2 98% RR 16

## 2020-07-27 NOTE — ED Provider Notes (Signed)
Emergency Medicine Provider Triage Evaluation Note  [name] , a 55 y.o. female  was evaluated in triage.  Pt complains of R leg pain for the past 2 days.  Has a history of lymphedema but feels like it is more swollen.  Also complaining of pleuritic chest pain and shortness of breath since this morning.  Was diagnosed with PE and DVT several years ago.  Ran out of her Eliquis last week and unfortunately due to mail order delay she has not had it since.  Review of Systems  Positive: Chest pain, shortness of breath, leg swelling Negative: Fever  Physical Exam  BP (!) 157/85 (BP Location: Left Arm)   Pulse 92   Temp 99 F (37.2 C) (Oral)   Resp 18   LMP 01/31/2011   SpO2 94%  Gen:   Awake, no distress   Resp:  Normal effort  MSK:   Moves extremities without difficulty  Other:  edema noted bilaterally.  No respiratory distress  Medical Decision Making  Medically screening exam initiated at 4:10 PM.  Appropriate orders placed.  [name] was informed that the remainder of the evaluation will be completed by another provider, this initial triage assessment does not replace that evaluation, and the importance of remaining in the ED until their evaluation is complete.  Labs and imaging ordered   Lauren Heady, PA-C 07/27/20 1619    Lauren Dusky, MD 07/28/20 1106

## 2020-08-04 ENCOUNTER — Encounter: Payer: Medicare HMO | Admitting: Family Medicine

## 2020-08-09 DIAGNOSIS — R351 Nocturia: Secondary | ICD-10-CM | POA: Diagnosis not present

## 2020-08-09 DIAGNOSIS — N3941 Urge incontinence: Secondary | ICD-10-CM | POA: Diagnosis not present

## 2020-08-16 DIAGNOSIS — M62838 Other muscle spasm: Secondary | ICD-10-CM | POA: Diagnosis not present

## 2020-08-16 DIAGNOSIS — N3941 Urge incontinence: Secondary | ICD-10-CM | POA: Diagnosis not present

## 2020-08-16 DIAGNOSIS — M6281 Muscle weakness (generalized): Secondary | ICD-10-CM | POA: Diagnosis not present

## 2020-08-16 DIAGNOSIS — R351 Nocturia: Secondary | ICD-10-CM | POA: Diagnosis not present

## 2020-08-16 DIAGNOSIS — M6289 Other specified disorders of muscle: Secondary | ICD-10-CM | POA: Diagnosis not present

## 2020-08-20 ENCOUNTER — Encounter: Payer: Self-pay | Admitting: Podiatry

## 2020-08-20 ENCOUNTER — Other Ambulatory Visit: Payer: Self-pay

## 2020-08-20 ENCOUNTER — Ambulatory Visit (INDEPENDENT_AMBULATORY_CARE_PROVIDER_SITE_OTHER): Payer: Medicare HMO | Admitting: Podiatry

## 2020-08-20 DIAGNOSIS — Z6841 Body Mass Index (BMI) 40.0 and over, adult: Secondary | ICD-10-CM | POA: Insufficient documentation

## 2020-08-20 DIAGNOSIS — M79674 Pain in right toe(s): Secondary | ICD-10-CM | POA: Diagnosis not present

## 2020-08-20 DIAGNOSIS — R5382 Chronic fatigue, unspecified: Secondary | ICD-10-CM | POA: Insufficient documentation

## 2020-08-20 DIAGNOSIS — M79675 Pain in left toe(s): Secondary | ICD-10-CM

## 2020-08-20 DIAGNOSIS — E559 Vitamin D deficiency, unspecified: Secondary | ICD-10-CM | POA: Insufficient documentation

## 2020-08-20 DIAGNOSIS — B351 Tinea unguium: Secondary | ICD-10-CM

## 2020-08-20 DIAGNOSIS — M2550 Pain in unspecified joint: Secondary | ICD-10-CM | POA: Insufficient documentation

## 2020-08-20 DIAGNOSIS — I890 Lymphedema, not elsewhere classified: Secondary | ICD-10-CM

## 2020-08-20 DIAGNOSIS — G9332 Myalgic encephalomyelitis/chronic fatigue syndrome: Secondary | ICD-10-CM | POA: Insufficient documentation

## 2020-08-22 NOTE — Progress Notes (Signed)
  Subjective:  Patient ID: [name], female    DOB: Jun 15, 1965,  MRN: [id number]  TAUNA MACFARLANE presents to clinic today for thick, elongated toenails b/l feet which are tender when wearing enclosed shoe gear.  She has h/o lymphedema b/l LE. She voices no new pedal problems on today's visit.  She does have custom shoes to accommodate her lymphedema. States she does have lymphedema pump at home.  PCP is Gildardo Pounds, NP , and last visit was 07/12/2020.  Allergies  Allergen Reactions   Abatacept Anaphylaxis    Other reaction(s): Anaphylaxis-Streptomycin Clickject   Bee Venom Anaphylaxis   Augmentin [Amoxicillin-Pot Clavulanate] Hives, Itching and Other (See Comments)    Did PCN reaction causing immediate rash, facial/tongue/throat swelling, SOB or lightheadedness with hypotension: yes Did PCN reaction causing severe rash involving mucus membranes or skin necrosis: no Has patient had a PCN reaction that required hospitalization: in hospital Has patient had a PCN reaction occurring within the last 10 years: no If all of the above answers are "NO", then may proceed with Cephalosporin use.  Pt reports no recollection of any reactions when taking penicillin in past   Latex Hives and Other (See Comments)    Local reaction (welts). Patient denies any wheezing or other reaction with latex exposure   Nulytely [Peg 3350-Kcl-Na Bicarb-Nacl]     NAUSEA AND VOMITING. MAY TOLERATE LOW VOLUME PREP.    Review of Systems: Negative except as noted in the HPI. Objective:   Constitutional TYLIAH SCHLERETH is a pleasant 55 y.o. African American female, morbidly obese in NAD. AAO x 3.   Vascular Capillary fill time to digits <3 seconds b/l lower extremities. Nonpalpable pedal pulse(s) b/l lower extremities. Pedal hair absent. Lower extremity skin temperature gradient within normal limits. No pain with calf compression b/l. Lymphedema present b/l lower extremities. No cyanosis or clubbing  noted.  Neurologic Normal speech. Oriented to person, place, and time. Protective sensation diminished with 10g monofilament b/l.  Dermatologic No open wounds b/l lower extremities No interdigital macerations b/l lower extremities Toenails 1-5 b/l elongated, discolored, dystrophic, thickened, crumbly with subungual debris and tenderness to dorsal palpation. Skin b/l lower extremities noted to be thickened and brawny consistent with lymphedema.  Orthopedic: Normal muscle strength 5/5 to all lower extremity muscle groups bilaterally. Sausage digits 1-5 b/l consisitent with findings of lymphedema. Utilizes rollator for ambulation assistance.   Radiographs: None Assessment:   1. Pain due to onychomycosis of toenails of both feet   2. Lymphedema of both lower extremities    Plan:  Patient was evaluated and treated and all questions answered.  Onychomycosis with pain -Nails palliatively debridement as below -Educated on self-care  Procedure: Nail Debridement Rationale: Pain Type of Debridement: manual, sharp debridement. Instrumentation: Nail nipper, rotary burr. Number of Nails: 10 -Examined patient. -Patient to continue custom shoes to accommodate lymphedema. -Toenails 1-5 b/l were debrided in length and girth with sterile nail nippers and dremel without iatrogenic bleeding.  -Patient to report any pedal injuries to medical professional immediately. -Patient/POA to call should there be question/concern in the interim.  Return in about 3 months (around 11/20/2020).  Marzetta Board, DPM

## 2020-09-01 ENCOUNTER — Other Ambulatory Visit: Payer: Self-pay | Admitting: Nurse Practitioner

## 2020-09-01 DIAGNOSIS — R000 Tachycardia, unspecified: Secondary | ICD-10-CM

## 2020-09-01 NOTE — Telephone Encounter (Signed)
  Notes to clinic:  Review for continue use and refill  Last filled on 04/14/2020   Requested Prescriptions  Pending Prescriptions Disp Refills   carvedilol (COREG) 6.25 MG tablet [Pharmacy Med Name: Carvedilol 6.25 MG Oral Tablet] 180 tablet 0    Sig: TAKE 1 TABLET BY MOUTH TWICE DAILY WITH A MEAL      Cardiovascular:  Beta Blockers Passed - 09/01/2020 11:46 AM      Passed - Last BP in normal range    BP Readings from Last 1 Encounters:  07/27/20 138/68          Passed - Last Heart Rate in normal range    Pulse Readings from Last 1 Encounters:  07/27/20 83          Passed - Valid encounter within last 6 months    Recent Outpatient Visits           1 month ago Prediabetes   [place], Vernia Buff, NP   4 months ago Prediabetes   [place], Vernia Buff, NP   7 months ago Flank pain, chronic   [place], Vernia Buff, NP   10 months ago Stress incontinence   [place], Vernia Buff, NP   1 year ago Polyarthritis with positive rheumatoid factor [facility])   [place], [name] W, NP       Future Appointments             In 1 month Gildardo Pounds, NP [place]

## 2020-09-10 DIAGNOSIS — N3941 Urge incontinence: Secondary | ICD-10-CM | POA: Diagnosis not present

## 2020-09-10 DIAGNOSIS — R351 Nocturia: Secondary | ICD-10-CM | POA: Diagnosis not present

## 2020-09-15 ENCOUNTER — Ambulatory Visit: Payer: Medicare HMO

## 2020-09-29 ENCOUNTER — Ambulatory Visit
Admission: RE | Admit: 2020-09-29 | Discharge: 2020-09-29 | Disposition: A | Discharge status: Home / Self Care | Payer: Medicare HMO | Source: Ambulatory Visit | Attending: Nurse Practitioner | Admitting: Nurse Practitioner

## 2020-09-29 ENCOUNTER — Other Ambulatory Visit: Payer: Self-pay

## 2020-09-29 DIAGNOSIS — Z1231 Encounter for screening mammogram for malignant neoplasm of breast: Secondary | ICD-10-CM

## 2020-10-04 ENCOUNTER — Encounter: Payer: Self-pay | Admitting: *Deleted

## 2020-10-04 DIAGNOSIS — M6289 Other specified disorders of muscle: Secondary | ICD-10-CM | POA: Diagnosis not present

## 2020-10-04 DIAGNOSIS — M62838 Other muscle spasm: Secondary | ICD-10-CM | POA: Diagnosis not present

## 2020-10-04 DIAGNOSIS — I890 Lymphedema, not elsewhere classified: Secondary | ICD-10-CM | POA: Diagnosis not present

## 2020-10-04 DIAGNOSIS — M6281 Muscle weakness (generalized): Secondary | ICD-10-CM | POA: Diagnosis not present

## 2020-10-04 DIAGNOSIS — N3941 Urge incontinence: Secondary | ICD-10-CM | POA: Diagnosis not present

## 2020-10-04 DIAGNOSIS — R351 Nocturia: Secondary | ICD-10-CM | POA: Diagnosis not present

## 2020-10-11 DIAGNOSIS — M2550 Pain in unspecified joint: Secondary | ICD-10-CM | POA: Diagnosis not present

## 2020-10-11 DIAGNOSIS — R5382 Chronic fatigue, unspecified: Secondary | ICD-10-CM | POA: Diagnosis not present

## 2020-10-11 DIAGNOSIS — I890 Lymphedema, not elsewhere classified: Secondary | ICD-10-CM | POA: Diagnosis not present

## 2020-10-11 DIAGNOSIS — E559 Vitamin D deficiency, unspecified: Secondary | ICD-10-CM | POA: Diagnosis not present

## 2020-10-11 DIAGNOSIS — Z6841 Body Mass Index (BMI) 40.0 and over, adult: Secondary | ICD-10-CM | POA: Diagnosis not present

## 2020-10-11 DIAGNOSIS — D6861 Antiphospholipid syndrome: Secondary | ICD-10-CM | POA: Diagnosis not present

## 2020-10-11 DIAGNOSIS — M0579 Rheumatoid arthritis with rheumatoid factor of multiple sites without organ or systems involvement: Secondary | ICD-10-CM | POA: Diagnosis not present

## 2020-10-12 ENCOUNTER — Ambulatory Visit: Payer: Medicare HMO | Attending: Nurse Practitioner | Admitting: Nurse Practitioner

## 2020-10-12 ENCOUNTER — Other Ambulatory Visit: Payer: Self-pay

## 2020-10-12 VITALS — BP 130/89 | HR 82 | Temp 98.6°F | Ht 72.0 in | Wt 374.2 lb

## 2020-10-12 DIAGNOSIS — D649 Anemia, unspecified: Secondary | ICD-10-CM

## 2020-10-12 DIAGNOSIS — R7303 Prediabetes: Secondary | ICD-10-CM | POA: Diagnosis not present

## 2020-10-12 DIAGNOSIS — Z1211 Encounter for screening for malignant neoplasm of colon: Secondary | ICD-10-CM

## 2020-10-12 DIAGNOSIS — Z86711 Personal history of pulmonary embolism: Secondary | ICD-10-CM

## 2020-10-12 MED ORDER — FOLIC ACID 1 MG PO TABS: 1.0000 mg | ORAL_TABLET | Freq: Every day | ORAL | 3 refills | Status: DC

## 2020-10-12 MED ORDER — APIXABAN 5 MG PO TABS: 5.0000 mg | ORAL_TABLET | Freq: Two times a day (BID) | ORAL | 3 refills | Status: DC

## 2020-10-12 NOTE — Progress Notes (Signed)
Assessment & Plan:  [name] was seen today for follow-up.  Diagnoses and all orders for this visit:  Prediabetes -     Hemoglobin A1c -     CMP14+EGFR  Colon cancer screening -     Ambulatory referral to Gastroenterology  History of pulmonary embolus (PE) -     apixaban (ELIQUIS) 5 MG TABS tablet; Take 1 tablet (5 mg total) by mouth 2 (two) times daily.  Anemia, unspecified type -     folic acid (FOLVITE) 1 MG tablet; Take 1 tablet (1 mg total) by mouth daily.   Patient has been counseled on age-appropriate routine health concerns for screening and prevention. These are reviewed and up-to-date. Referrals have been placed accordingly. Immunizations are up-to-date or declined.    Subjective:   Chief Complaint  Patient presents with   Follow-up   [name] 55 y.o. female presents to office today for follow up to Prediabetes and Dyslipidemia. She is currently being followed by physical therapy for BLE lymphedema. Followed by Urology for urge incontinence and Rheumatology for RA.   She has a past medical history of Anemia, Anxiety, OA,  Asthma, Urinary incontinence with foley in place,  Depression (08/2010), Uterine fibroids, Keloid, Lymphedema, and Pulmonary embolism.  Doing well today. No concerns at this time.    Prediabetes Well controlled. She does not take any oral or injectable diabetic medications.  Lab Results  Component Value Date   HGBA1C 6.0 (H) 10/12/2020    Lab Results  Component Value Date   CHOL 185 07/12/2020   CHOL 218 (H) 10/13/2019   Lab Results  Component Value Date   HDL 50 07/12/2020   HDL 71 10/13/2019   Lab Results  Component Value Date   LDLCALC 117 (H) 07/12/2020   LDLCALC 134 (H) 10/13/2019   Lab Results  Component Value Date   TRIG 100 07/12/2020   TRIG 75 10/13/2019   Lab Results  Component Value Date   CHOLHDL 3.7 07/12/2020   CHOLHDL 3.1 10/13/2019    Blood pressure is well controlled. Taking carvedilol 6.25  mg BID for sinus tachycardia. Denies chest pain, shortness of breath, palpitations, lightheadedness, dizziness, headaches  BP Readings from Last 3 Encounters:  10/12/20 130/89  07/27/20 138/68  07/12/20 130/82     The 10-year ASCVD risk score Mikey Bussing DC Jr., et al., 2013) is: 4.4%   Values used to calculate the score:     Age: 10 years     Sex: Female     Is Non-Hispanic African American: Yes     Diabetic: No     Tobacco smoker: No     Systolic Blood Pressure: 299 mmHg     Is BP treated: Yes     HDL Cholesterol: 50 mg/dL     Total Cholesterol: 185 mg/dL   Review of Systems  Constitutional:  Negative for fever, malaise/fatigue and weight loss.  HENT: Negative.  Negative for nosebleeds.   Eyes: Negative.  Negative for blurred vision, double vision and photophobia.  Respiratory: Negative.  Negative for cough and shortness of breath.   Cardiovascular:  Positive for leg swelling (CHRONIC). Negative for chest pain, palpitations and PND.  Gastrointestinal: Negative.  Negative for heartburn, nausea and vomiting.  Genitourinary:        URGE INCONTINENCE  Musculoskeletal: Negative.  Negative for myalgias.  Neurological: Negative.  Negative for dizziness, focal weakness, seizures and headaches.  Psychiatric/Behavioral: Negative.  Negative for suicidal ideas.    Past Medical History:  Diagnosis Date   Anemia    Anxiety    Arthritis    Asthma    hx as child - no prob as adult - no inhaler   Blood transfusion 01/22/11   transfusion 2 units at [facility]   Depression 08/2010   psych assessment   Dyspnea    Fibroid    Headache(784.0)    rx for imitrex - last one jan   Keloid    Lymphedema    Pulmonary embolism [facility])     Past Surgical History:  Procedure Laterality Date   ABDOMINAL HYSTERECTOMY     COLONOSCOPY N/A 11/08/2017   Procedure: COLONOSCOPY;  Surgeon: Danie Binder, MD;  Location: [facility];  Service: Endoscopy;  Laterality: N/A;  2:00pm   FLEXIBLE SIGMOIDOSCOPY N/A  09/17/2017   Procedure: FLEXIBLE SIGMOIDOSCOPY;  Surgeon: Danie Binder, MD;  Location: [facility];  Service: Endoscopy;  Laterality: N/A;   HERNIA REPAIR  8882   umbilical   POLYPECTOMY  11/08/2017   Procedure: POLYPECTOMY;  Surgeon: Danie Binder, MD;  Location: [facility];  Service: Endoscopy;;  ascending colon (CSx1), transverse colon (CS x1), splenic flexure (HSx1)   SVD     Spontaneous vaginal delivery; x 1    Family History  Problem Relation Age of Onset   Heart disease Mother    Hypertension Mother    Hypertension Father    Colon cancer Father 27       Passed away 68 yrs old   Hypertension Sister    Cancer Maternal Grandmother        gastric cancer   Cancer Maternal Grandfather        pancreatic cancer   Colon cancer Paternal Grandmother    Colon cancer Paternal Uncle    Colon polyps Neg Hx     Social History Reviewed with no changes to be made today.   Outpatient Medications Prior to Visit  Medication Sig Dispense Refill   acetaminophen (TYLENOL) 325 MG tablet Take 2 tablets (650 mg total) by mouth every 6 (six) hours as needed for mild pain (or Fever >/= 101). 60 tablet 0   albuterol (VENTOLIN HFA) 108 (90 Base) MCG/ACT inhaler Inhale 2 puffs into the lungs every 6 (six) hours as needed for wheezing or shortness of breath. 18 g 2   carvedilol (COREG) 6.25 MG tablet TAKE 1 TABLET BY MOUTH TWICE DAILY WITH A MEAL 180 tablet 0   CIMZIA PREFILLED 2 X 200 MG/ML PSKT Inject into the skin.     CIMZIA STARTER KIT 6 X 200 MG/ML KIT Inject into the skin.     HYDROcodone-acetaminophen (NORCO) 5-325 MG tablet Take 1-2 tablets by mouth every 6 (six) hours as needed. 12 tablet 0   methotrexate 50 MG/2ML injection Inject into the skin.     Misc. Devices (BARIATRIC ROLLATOR) MISC Please provide patient with insurance approved bariatric rollator walker. I89.0, R53.81, R26.81 1 each 0   ORENCIA CLICKJECT 800 MG/ML SOAJ Inject into the skin.     Vitamin D, Ergocalciferol,  (DRISDOL) 1.25 MG (50000 UNIT) CAPS capsule TAKE 1 CAPSULE (50,000 UNITS TOTAL) BY MOUTH EVERY 7 (SEVEN) DAYS. 12 capsule 1   Vitamins A & D (VITAMIN A & D) ointment Apply 1 application topically daily as needed for dry skin.     apixaban (ELIQUIS) 5 MG TABS tablet Take 1 tablet (5 mg total) by mouth 2 (two) times daily. 20 tablet 0   No facility-administered medications prior to visit.  Allergies  Allergen Reactions   Abatacept Anaphylaxis    Other reaction(s): Anaphylaxis-Streptomycin Clickject   Bee Venom Anaphylaxis   Augmentin [Amoxicillin-Pot Clavulanate] Hives, Itching and Other (See Comments)    Did PCN reaction causing immediate rash, facial/tongue/throat swelling, SOB or lightheadedness with hypotension: yes Did PCN reaction causing severe rash involving mucus membranes or skin necrosis: no Has patient had a PCN reaction that required hospitalization: in hospital Has patient had a PCN reaction occurring within the last 10 years: no If all of the above answers are "NO", then may proceed with Cephalosporin use.  Pt reports no recollection of any reactions when taking penicillin in past   Latex Hives and Other (See Comments)    Local reaction (welts). Patient denies any wheezing or other reaction with latex exposure   Nulytely [Peg 3350-Kcl-Na Bicarb-Nacl]     NAUSEA AND VOMITING. MAY TOLERATE LOW VOLUME PREP.       Objective:    BP 130/89 (BP Location: Left Arm, Patient Position: Sitting, Cuff Size: Large)   Pulse 82   Temp 98.6 F (37 C) (Oral)   Ht 6' (1.829 m)   Wt (!) 374 lb 3.2 oz (169.7 kg)   LMP 01/31/2011   SpO2 99%   BMI 50.75 kg/m  Wt Readings from Last 3 Encounters:  10/12/20 (!) 374 lb 3.2 oz (169.7 kg)  07/12/20 (!) 363 lb (164.7 kg)  05/10/20 (!) 346 lb (156.9 kg)    Physical Exam Vitals and nursing note reviewed.  Constitutional:      Appearance: She is well-developed.  HENT:     Head: Normocephalic and atraumatic.  Cardiovascular:      Rate and Rhythm: Normal rate and regular rhythm.     Heart sounds: Normal heart sounds. No murmur heard.   No friction rub. No gallop.  Pulmonary:     Effort: Pulmonary effort is normal. No tachypnea or respiratory distress.     Breath sounds: Normal breath sounds. No decreased breath sounds, wheezing, rhonchi or rales.  Chest:     Chest wall: No tenderness.  Abdominal:     General: Bowel sounds are normal.     Palpations: Abdomen is soft.  Musculoskeletal:        General: Normal range of motion.     Cervical back: Normal range of motion.     Right lower leg: Edema present.     Left lower leg: Edema present.  Skin:    General: Skin is warm and dry.  Neurological:     Mental Status: She is alert and oriented to person, place, and time.     Coordination: Coordination normal.  Psychiatric:        Behavior: Behavior normal. Behavior is cooperative.        Thought Content: Thought content normal.        Judgment: Judgment normal.         Patient has been counseled extensively about nutrition and exercise as well as the importance of adherence with medications and regular follow-up. The patient was given clear instructions to go to ER or return to medical center if symptoms don't improve, worsen or new problems develop. The patient verbalized understanding.   Follow-up: Return in about 3 months (around 01/12/2021).   Gildardo Pounds, FNP-BC [facility] [place], [place]   10/13/2020, 7:02 PM

## 2020-10-13 ENCOUNTER — Encounter: Payer: Self-pay | Admitting: Nurse Practitioner

## 2020-10-13 LAB — CMP14+EGFR
ALT: 15 IU/L (ref 0–32)
AST: 22 IU/L (ref 0–40)
Albumin/Globulin Ratio: 1.2 (ref 1.2–2.2)
Albumin: 4.2 g/dL (ref 3.8–4.9)
Alkaline Phosphatase: 75 IU/L (ref 44–121)
BUN/Creatinine Ratio: 11 (ref 9–23)
BUN: 10 mg/dL (ref 6–24)
Bilirubin Total: 0.9 mg/dL (ref 0.0–1.2)
CO2: 20 mmol/L (ref 20–29)
Calcium: 9.3 mg/dL (ref 8.7–10.2)
Chloride: 104 mmol/L (ref 96–106)
Creatinine, Ser: 0.94 mg/dL (ref 0.57–1.00)
Globulin, Total: 3.5 g/dL (ref 1.5–4.5)
Glucose: 88 mg/dL (ref 65–99)
Potassium: 4.2 mmol/L (ref 3.5–5.2)
Sodium: 140 mmol/L (ref 134–144)
Total Protein: 7.7 g/dL (ref 6.0–8.5)
eGFR: 73 mL/min/{1.73_m2} (ref >59)

## 2020-10-13 LAB — HEMOGLOBIN A1C
Est. average glucose Bld gHb Est-mCnc: 126 mg/dL
Hgb A1c MFr Bld: 6 % — ABNORMAL HIGH (ref 4.8–5.6)

## 2020-10-30 ENCOUNTER — Ambulatory Visit (HOSPITAL_BASED_OUTPATIENT_CLINIC_OR_DEPARTMENT_OTHER): Payer: Medicare HMO

## 2020-10-30 DIAGNOSIS — Z0000 Encounter for general adult medical examination without abnormal findings: Secondary | ICD-10-CM | POA: Diagnosis not present

## 2020-10-30 NOTE — Patient Instructions (Signed)
Health Maintenance, Female Adopting a healthy lifestyle and getting preventive care are important in promoting health and wellness. Ask your health care provider about: The right schedule for you to have regular tests and exams. Things you can do on your own to prevent diseases and keep yourself healthy. What should I know about diet, weight, and exercise? Eat a healthy diet  Eat a diet that includes plenty of vegetables, fruits, low-fat dairy products, and lean protein. Do not eat a lot of foods that are high in solid fats, added sugars, or sodium. Maintain a healthy weight Body mass index (BMI) is used to identify weight problems. It estimates body fat based on height and weight. Your health care provider can help determine your BMI and help you achieve or maintain a healthy weight. Get regular exercise Get regular exercise. This is one of the most important things you can do for your health. Most adults should: Exercise for at least 150 minutes each week. The exercise should increase your heart rate and make you sweat (moderate-intensity exercise). Do strengthening exercises at least twice a week. This is in addition to the moderate-intensity exercise. Spend less time sitting. Even light physical activity can be beneficial. Watch cholesterol and blood lipids Have your blood tested for lipids and cholesterol at 55 years of age, then have this test every 5 years. Have your cholesterol levels checked more often if: Your lipid or cholesterol levels are high. You are older than 53 years of age. You are at high risk for heart disease. What should I know about cancer screening? Depending on your health history and family history, you may need to have cancer screening at various ages. This may include screening for: Breast cancer. Cervical cancer. Colorectal cancer. Skin cancer. Lung cancer. What should I know about heart disease, diabetes, and high blood pressure? Blood pressure and heart  disease High blood pressure causes heart disease and increases the risk of stroke. This is more likely to develop in people who have high blood pressure readings, are of African descent, or are overweight. Have your blood pressure checked: Every 3-5 years if you are 18-39 years of age. Every year if you are 40 years old or older. Diabetes Have regular diabetes screenings. This checks your fasting blood sugar level. Have the screening done: Once every three years after age 40 if you are at a normal weight and have a low risk for diabetes. More often and at a younger age if you are overweight or have a high risk for diabetes. What should I know about preventing infection? Hepatitis B If you have a higher risk for hepatitis B, you should be screened for this virus. Talk with your health care provider to find out if you are at risk for hepatitis B infection. Hepatitis C Testing is recommended for: Everyone born from 1945 through 1965. Anyone with known risk factors for hepatitis C. Sexually transmitted infections (STIs) Get screened for STIs, including gonorrhea and chlamydia, if: You are sexually active and are younger than 53 years of age. You are older than 53 years of age and your health care provider tells you that you are at risk for this type of infection. Your sexual activity has changed since you were last screened, and you are at increased risk for chlamydia or gonorrhea. Ask your health care provider if you are at risk. Ask your health care provider about whether you are at high risk for HIV. Your health care provider may recommend a prescription medicine   to help prevent HIV infection. If you choose to take medicine to prevent HIV, you should first get tested for HIV. You should then be tested every 3 months for as long as you are taking the medicine. Pregnancy If you are about to stop having your period (premenopausal) and you may become pregnant, seek counseling before you get  pregnant. Take 400 to 800 micrograms (mcg) of folic acid every day if you become pregnant. Ask for birth control (contraception) if you want to prevent pregnancy. Osteoporosis and menopause Osteoporosis is a disease in which the bones lose minerals and strength with aging. This can result in bone fractures. If you are 65 years old or older, or if you are at risk for osteoporosis and fractures, ask your health care provider if you should: Be screened for bone loss. Take a calcium or vitamin D supplement to lower your risk of fractures. Be given hormone replacement therapy (HRT) to treat symptoms of menopause. Follow these instructions at home: Lifestyle Do not use any products that contain nicotine or tobacco, such as cigarettes, e-cigarettes, and chewing tobacco. If you need help quitting, ask your health care provider. Do not use street drugs. Do not share needles. Ask your health care provider for help if you need support or information about quitting drugs. Alcohol use Do not drink alcohol if: Your health care provider tells you not to drink. You are pregnant, may be pregnant, or are planning to become pregnant. If you drink alcohol: Limit how much you use to 0-1 drink a day. Limit intake if you are breastfeeding. Be aware of how much alcohol is in your drink. In the [place] one drink equals one 12 oz bottle of beer (355 mL), one 5 oz glass of wine (148 mL), or one 1 oz glass of hard liquor (44 mL). General instructions Schedule regular health, dental, and eye exams. Stay current with your vaccines. Tell your health care provider if: You often feel depressed. You have ever been abused or do not feel safe at home. Summary Adopting a healthy lifestyle and getting preventive care are important in promoting health and wellness. Follow your health care provider's instructions about healthy diet, exercising, and getting tested or screened for diseases. Follow your health care provider's  instructions on monitoring your cholesterol and blood pressure. This information is not intended to replace advice given to you by your health care provider. Make sure you discuss any questions you have with your health care provider. Document Revised: 04/23/2020 Document Reviewed: 02/06/2018 Elsevier Patient Education  2022 [employer]  

## 2020-10-30 NOTE — Progress Notes (Signed)
Subjective:   [name] is a 55 y.o. female who presents for an Initial Medicare Annual Wellness Visit. I connected with  [name] on 10/30/20 by a audio enabled telemedicine application and verified that I am speaking with the correct person using two identifiers.   I discussed the limitations of evaluation and management by telemedicine. The patient expressed understanding and agreed to proceed.   Location of patient: Home  Location of provider: Office  Persons participating in visit: [name] (patient) and Drenda Freeze, [place]  Review of Systems    Defer to PCP       Objective:    Today's Vitals   10/30/20 1112  PainSc: 5    There is no height or weight on file to calculate BMI.  Advanced Directives 10/30/2020 05/10/2020 09/06/2019 01/04/2019 08/06/2018 03/06/2018 12/14/2017  Does Patient Have a Medical Advance Directive? _0  No No  Would patient like information on creating a medical advance directive? - - - No - Patient declined No - Patient declined - No - Patient declined  Pre-existing out of facility DNR order (yellow form or pink MOST form) - - - - - - -    Current Medications (verified) Outpatient Encounter Medications as of 10/30/2020  Medication Sig   acetaminophen (TYLENOL) 325 MG tablet Take 2 tablets (650 mg total) by mouth every 6 (six) hours as needed for mild pain (or Fever >/= 101).   albuterol (VENTOLIN HFA) 108 (90 Base) MCG/ACT inhaler Inhale 2 puffs into the lungs every 6 (six) hours as needed for wheezing or shortness of breath.   apixaban (ELIQUIS) 5 MG TABS tablet Take 1 tablet (5 mg total) by mouth 2 (two) times daily.   carvedilol (COREG) 6.25 MG tablet TAKE 1 TABLET BY MOUTH TWICE DAILY WITH A MEAL   CIMZIA PREFILLED 2 X 200 MG/ML PSKT Inject into the skin.   CIMZIA STARTER KIT 6 X 200 MG/ML KIT Inject into the skin.   folic acid (FOLVITE) 1 MG tablet Take 1 tablet (1 mg total) by mouth daily.   methotrexate 50 MG/2ML  injection Inject into the skin.   Misc. Devices (BARIATRIC ROLLATOR) MISC Please provide patient with insurance approved bariatric rollator walker. I89.0, R53.81, R26.81   Vitamins A & D (VITAMIN A & D) ointment Apply 1 application topically daily as needed for dry skin.   [DISCONTINUED] HYDROcodone-acetaminophen (NORCO) 5-325 MG tablet Take 1-2 tablets by mouth every 6 (six) hours as needed.   [DISCONTINUED] ORENCIA CLICKJECT 111 MG/ML SOAJ Inject into the skin.   No facility-administered encounter medications on file as of 10/30/2020.    Allergies (verified) Abatacept, Bee venom, Augmentin [amoxicillin-pot clavulanate], Latex, and Nulytely [peg 3350-kcl-na bicarb-nacl]   History: Past Medical History:  Diagnosis Date   Anemia    Anxiety    Arthritis    Asthma    hx as child - no prob as adult - no inhaler   Blood transfusion 01/22/11   transfusion 2 units at [facility]   Depression 08/2010   psych assessment   Dyspnea    Fibroid    Headache(784.0)    rx for imitrex - last one jan   Keloid    Lymphedema    Pulmonary embolism ([place])    Past Surgical History:  Procedure Laterality Date   ABDOMINAL HYSTERECTOMY     COLONOSCOPY N/A 11/08/2017   Procedure: COLONOSCOPY;  Surgeon: Danie Binder, MD;  [facility];  Service:  Endoscopy;  Laterality: N/A;  2:00pm   FLEXIBLE SIGMOIDOSCOPY N/A 09/17/2017   Procedure: FLEXIBLE SIGMOIDOSCOPY;  Surgeon: Danie Binder, MD;  Location: [facility];  Service: Endoscopy;  Laterality: N/A;   HERNIA REPAIR  8032   umbilical   POLYPECTOMY  11/08/2017   Procedure: POLYPECTOMY;  Surgeon: Danie Binder, MD;  Location: [facility];  Service: Endoscopy;;  ascending colon (CSx1), transverse colon (CS x1), splenic flexure (HSx1)   SVD     Spontaneous vaginal delivery; x 1   Family History  Problem Relation Age of Onset   Heart disease Mother    Hypertension Mother    Hypertension Father    Colon cancer Father 39       Passed away 4 yrs  old   Hypertension Sister    Cancer Maternal Grandmother        gastric cancer   Cancer Maternal Grandfather        pancreatic cancer   Colon cancer Paternal Grandmother    Colon cancer Paternal Uncle    Colon polyps Neg Hx    Social History   Socioeconomic History   Marital status: Single    Spouse name: Not on file   Number of children: 1   Years of education: Not on file   Highest education level: Not on file  Occupational History   Occupation: medical billing    Employer: [employer]  Tobacco Use   Smoking status: Never   Smokeless tobacco: Never  Vaping Use   Vaping Use: Never used  Substance and Sexual Activity   Alcohol use: No   Drug use: No   Sexual activity: Not Currently    Birth control/protection: None, Surgical  Other Topics Concern   Not on file  Social History Narrative   Not on file   Social Determinants of Health   Financial Resource Strain: Medium Risk   Difficulty of Paying Living Expenses: Somewhat hard  Food Insecurity: No Food Insecurity   Worried About Charity fundraiser in the Last Year: Never true   Ran Out of Food in the Last Year: Never true  Transportation Needs: Unmet Transportation Needs   Lack of Transportation (Medical): Yes   Lack of Transportation (Non-Medical): No  Physical Activity: Insufficiently Active   Days of Exercise per Week: 3 days   Minutes of Exercise per Session: 30 min  Stress: No Stress Concern Present   Feeling of Stress : Not at all  Social Connections: Socially Isolated   Frequency of Communication with Friends and Family: Three times a week   Frequency of Social Gatherings with Friends and Family: More than three times a week   Attends Religious Services: Never   Marine scientist or Organizations: No   Attends Music therapist: Never   Marital Status: Never married    Tobacco Counseling Counseling given: Not Answered   Clinical Intake:  Pre-visit preparation completed: Yes  Pain :  0-10 Pain Score: 5  Pain Type: Chronic pain Pain Location: Wrist Pain Descriptors / Indicators: Aching, Dull Pain Onset: Other (comment) Pain Frequency: Constant     Diabetes: No  How often do you need to have someone help you when you read instructions, pamphlets, or other written materials from your doctor or pharmacy?: 1 - Never What is the last grade level you completed in school?: Two years of college  Diabetic?No  Interpreter Needed?: No  Information entered by :: Drenda Freeze, CMA   Activities of Daily  Living In your present state of health, do you have any difficulty performing the following activities: 10/30/2020  Hearing? N  Vision? Y  Difficulty concentrating or making decisions? N  Walking or climbing stairs? Y  Dressing or bathing? Y  Doing errands, shopping? N  Preparing Food and eating ? Y  Comment preparing food  Using the Toilet? N  In the past six months, have you accidently leaked urine? Y  Do you have problems with loss of bowel control? N  Managing your Medications? N  Managing your Finances? N  Housekeeping or managing your Housekeeping? Y  Some recent data might be hidden    Patient Care Team: Gildardo Pounds, NP as PCP - General (Nurse Practitioner) Danie Binder, MD (Inactive) as Consulting Physician (Gastroenterology) Juan Quam, MD as Referring Physician (Orthopedic Surgery)  Indicate any recent Medical Services you may have received from other than [facility] providers in the past year (date may be approximate).     Assessment:   This is a routine wellness examination for Lindsay.  Hearing/Vision screen No results found.  Dietary issues and exercise activities discussed: Current Exercise Habits: Structured exercise class;Home exercise routine, Type of exercise: strength training/weights;stretching, Time (Minutes): 30, Frequency (Times/Week): 5, Weekly Exercise (Minutes/Week): 150, Intensity: Intense   Goals Addressed   None     Depression Screen PHQ 2/9 Scores 10/30/2020 10/12/2020 07/12/2020 04/14/2020 08/27/2018 03/06/2018 09/10/2017  PHQ - 2 Score 0 0 1 3 0 1 1  PHQ- 9 Score - - 5 13 - - 4    Fall Risk Fall Risk  10/30/2020 10/12/2020  Falls in the past year? 0 0  Number falls in past yr: 0 -  Injury with Fall? 0 -  Risk for fall due to : No Fall Risks -  Follow up Falls evaluation completed -    FALL RISK PREVENTION PERTAINING TO THE HOME:  Any stairs in or around the home? Yes  If so, are there any without handrails? No  Home free of loose throw rugs in walkways, pet beds, electrical cords, etc? Yes  Adequate lighting in your home to reduce risk of falls? Yes   ASSISTIVE DEVICES UTILIZED TO PREVENT FALLS:  Life alert? No  Use of a cane, walker or w/c? Yes  Grab bars in the bathroom? No  Shower chair or bench in shower? Yes  Elevated toilet seat or a handicapped toilet? Yes   TIMED UP AND GO:  Was the test performed?  N/A .  Length of time to ambulate 10 feet: N/A sec.     Cognitive Function:     6CIT Screen 10/30/2020  What Year? 0 points  What month? 0 points  What time? 0 points  Count back from 20 0 points  Months in reverse 0 points  Repeat phrase 0 points  Total Score 0    Immunizations Immunization History  Administered Date(s) Administered   Influenza Split 01/23/2011   PPD Test 05/19/2013    TDAP status: Due, Education has been provided regarding the importance of this vaccine. Advised may receive this vaccine at local pharmacy or Health Dept. Aware to provide a copy of the vaccination record if obtained from local pharmacy or Health Dept. Verbalized acceptance and understanding.  Flu Vaccine status: Due, Education has been provided regarding the importance of this vaccine. Advised may receive this vaccine at local pharmacy or Health Dept. Aware to provide a copy of the vaccination record if obtained from local pharmacy or Health Dept.  Verbalized acceptance and  understanding.   Covid-19 vaccine status: Information provided on how to obtain vaccines.   Qualifies for Shingles Vaccine? Yes   Zostavax completed No   Shingrix Completed?: No.    Education has been provided regarding the importance of this vaccine. Patient has been advised to call insurance company to determine out of pocket expense if they have not yet received this vaccine. Advised may also receive vaccine at local pharmacy or Health Dept. Verbalized acceptance and understanding.  Screening Tests Health Maintenance  Topic Date Due   COVID-19 Vaccine (1) Never done   Zoster Vaccines- Shingrix (1 of 2) Never done   INFLUENZA VACCINE  09/27/2020   TETANUS/TDAP  04/14/2021 (Originally [birth year]1987)   MAMMOGRAM  09/30/2022   COLONOSCOPY (Pts 45-90yr Insurance coverage will need to be confirmed)  11/09/2027   Hepatitis C Screening  Completed   HIV Screening  Completed   Pneumococcal Vaccine [id number] old  Aged Out   HPV VACCINES  Aged Out   PAP SMEAR-Modifier  Discontinued    Health Maintenance  Health Maintenance Due  Topic Date Due   COVID-19 Vaccine (1) Never done   Zoster Vaccines- Shingrix (1 of 2) Never done   INFLUENZA VACCINE  09/27/2020    Colorectal cancer screening: Type of screening: Colonoscopy. Completed 11/08/2017. Repeat every 10 years  Mammogram status: Completed 09/29/2020. Repeat every year    Lung Cancer Screening: (Low Dose CT Chest recommended if Age 53-80years, 30 pack-year currently smoking OR have quit w/in 15years.) does not qualify.   Lung Cancer Screening Referral: N/A  Additional Screening:  Hepatitis C Screening: does qualify; Completed 08/27/2018  Vision Screening: Recommended annual ophthalmology exams for early detection of glaucoma and other disorders of the eye. Is the patient up to date with their annual eye exam?  Yes  Who is the provider or what is the name of the office in which the patient attends annual eye exams? [facility] If pt is not established with a provider, would they like to be referred to a provider to establish care? No .   Dental Screening: Recommended annual dental exams for proper oral hygiene  Community Resource Referral / Chronic Care Management: CRR required this visit?  No   CCM required this visit?  No      Plan:     I have personally reviewed and noted the following in the patient's chart:   Medical and social history Use of alcohol, tobacco or illicit drugs  Current medications and supplements including opioid prescriptions. Patient is not currently taking opioid prescriptions. Functional ability and status Nutritional status Physical activity Advanced directives List of other physicians Hospitalizations, surgeries, and ER visits in previous 12 months Vitals Screenings to include cognitive, depression, and falls Referrals and appointments  In addition, I have reviewed and discussed with patient certain preventive protocols, quality metrics, and best practice recommendations. A written personalized care plan for preventive services as well as general preventive health recommendations were provided to patient.     GDrenda Freeze [facility]  10/30/2020   Nurse Notes: Non-Face to Face 60 minute visit encounter  Ms. Erney , Thank you for taking time to come for your Medicare Wellness Visit. I appreciate your ongoing commitment to your health goals. Please review the following plan we discussed and let me know if I can assist you in the future.   These are the goals we discussed:  Goals   None     This  is a list of the screening recommended for you and due dates:  Health Maintenance  Topic Date Due   COVID-19 Vaccine (1) Never done   Zoster (Shingles) Vaccine (1 of 2) Never done   Flu Shot  09/27/2020   Tetanus Vaccine  04/14/2021*   Mammogram  09/30/2022   Colon Cancer Screening  11/09/2027   Hepatitis C Screening: USPSTF Recommendation to screen - Ages 18-79 yo.   Completed   HIV Screening  Completed   Pneumococcal Vaccination  Aged Out   HPV Vaccine  Aged Out   Pap Smear  Discontinued  *Topic was postponed. The date shown is not the original due date.

## 2020-11-26 ENCOUNTER — Ambulatory Visit: Payer: Medicare HMO | Admitting: Podiatry

## 2020-11-29 ENCOUNTER — Telehealth: Payer: Self-pay | Admitting: *Deleted

## 2020-11-29 ENCOUNTER — Other Ambulatory Visit: Payer: Self-pay | Admitting: Nurse Practitioner

## 2020-11-29 ENCOUNTER — Other Ambulatory Visit: Payer: Self-pay | Admitting: Family Medicine

## 2020-11-29 DIAGNOSIS — Z86711 Personal history of pulmonary embolism: Secondary | ICD-10-CM

## 2020-11-29 DIAGNOSIS — R000 Tachycardia, unspecified: Secondary | ICD-10-CM

## 2020-11-30 NOTE — Telephone Encounter (Signed)
In error

## 2020-12-01 ENCOUNTER — Encounter: Payer: Self-pay | Admitting: Podiatry

## 2020-12-01 ENCOUNTER — Other Ambulatory Visit: Payer: Self-pay

## 2020-12-01 ENCOUNTER — Ambulatory Visit: Payer: Medicare HMO | Admitting: Podiatry

## 2020-12-01 DIAGNOSIS — M79674 Pain in right toe(s): Secondary | ICD-10-CM | POA: Diagnosis not present

## 2020-12-01 DIAGNOSIS — B351 Tinea unguium: Secondary | ICD-10-CM | POA: Diagnosis not present

## 2020-12-01 DIAGNOSIS — M79675 Pain in left toe(s): Secondary | ICD-10-CM

## 2020-12-06 NOTE — Progress Notes (Signed)
  Subjective:  Patient ID: [name], female    DOB: 1965/06/19,  MRN: [id number]  55 y.o. female presents thick, elongated toenails b/l lower extremities which are tender when wearing enclosed shoe gear.  Patient is requesting new custom molded shoes on today's visit. She is not diabetic.  PCP is Gildardo Pounds, NP , and last visit was 10/12/2020.Marland Kitchen  Allergies  Allergen Reactions   Abatacept Anaphylaxis    Other reaction(s): Anaphylaxis-Streptomycin Clickject   Bee Venom Anaphylaxis   Augmentin [Amoxicillin-Pot Clavulanate] Hives, Itching and Other (See Comments)    Did PCN reaction causing immediate rash, facial/tongue/throat swelling, SOB or lightheadedness with hypotension: yes Did PCN reaction causing severe rash involving mucus membranes or skin necrosis: no Has patient had a PCN reaction that required hospitalization: in hospital Has patient had a PCN reaction occurring within the last 10 years: no If all of the above answers are "NO", then may proceed with Cephalosporin use.  Pt reports no recollection of any reactions when taking penicillin in past   Latex Hives and Other (See Comments)    Local reaction (welts). Patient denies any wheezing or other reaction with latex exposure   Nulytely [Peg 3350-Kcl-Na Bicarb-Nacl]     NAUSEA AND VOMITING. MAY TOLERATE LOW VOLUME PREP.    Review of Systems: Negative except as noted in the HPI.   Objective:  Vascular Examination: Lymphedema present b/l lower extremities. Vascular status intact b/l with palpable pedal pulses. CFT immediate b/l. No edema. No pain with calf compression b/l. Skin temperature gradient WNL b/l.   Neurological Examination: Protective sensation intact 5/5 intact bilaterally with 10g monofilament b/l. Sensation grossly intact b/l with 10 gram monofilament. Vibratory sensation intact b/l.   Dermatological Examination: No open wounds b/l lower extremities. No interdigital macerations b/l lower  extremities. Toenails 1-5 b/l elongated, discolored, dystrophic, thickened, crumbly with subungual debris and tenderness to dorsal palpation. Skin b/l lower extremities noted to be thickened and brawny consistent with lymphedema. Pedal skin with normal turgor, texture and tone b/l. Toenails 1-5 b/l thick, discolored, elongated with subungual debris and pain on dorsal palpation. No hyperkeratotic lesions noted b/l.   Musculoskeletal Examination: Normal muscle strength 5/5 to all lower extremity muscle groups bilaterally. Sausage digits 1-5 b/l. Utilizes rollator for ambulation assistance..  Radiographs: None Assessment:   1. Pain due to onychomycosis of toenails of both feet    Plan:  -Examined patient. -Schedule appt with Pedorthist for custom molded shoes.. -Patient to continue soft, supportive shoe gear daily. -Toenails 1-5 b/l were debrided in length and girth with sterile nail nippers and dremel without iatrogenic bleeding.  -Patient to report any pedal injuries to medical professional immediately. -Patient/POA to call should there be question/concern in the interim.  Return in about 3 months (around 03/03/2021).  Marzetta Board, DPM

## 2020-12-16 ENCOUNTER — Telehealth: Payer: Self-pay | Admitting: Gastroenterology

## 2020-12-16 ENCOUNTER — Encounter: Payer: Self-pay | Admitting: Gastroenterology

## 2020-12-16 NOTE — Telephone Encounter (Signed)
error 

## 2021-01-12 ENCOUNTER — Ambulatory Visit (INDEPENDENT_AMBULATORY_CARE_PROVIDER_SITE_OTHER): Payer: Medicare HMO | Admitting: Gastroenterology

## 2021-01-12 ENCOUNTER — Encounter: Payer: Self-pay | Admitting: Gastroenterology

## 2021-01-12 ENCOUNTER — Telehealth: Payer: Self-pay

## 2021-01-12 VITALS — BP 160/98 | HR 103 | Ht 72.0 in | Wt 359.0 lb

## 2021-01-12 DIAGNOSIS — M2550 Pain in unspecified joint: Secondary | ICD-10-CM | POA: Diagnosis not present

## 2021-01-12 DIAGNOSIS — Z7901 Long term (current) use of anticoagulants: Secondary | ICD-10-CM

## 2021-01-12 DIAGNOSIS — Z86711 Personal history of pulmonary embolism: Secondary | ICD-10-CM

## 2021-01-12 DIAGNOSIS — M0579 Rheumatoid arthritis with rheumatoid factor of multiple sites without organ or systems involvement: Secondary | ICD-10-CM | POA: Diagnosis not present

## 2021-01-12 DIAGNOSIS — Z86010 Personal history of colonic polyps: Secondary | ICD-10-CM

## 2021-01-12 DIAGNOSIS — R5382 Chronic fatigue, unspecified: Secondary | ICD-10-CM | POA: Diagnosis not present

## 2021-01-12 DIAGNOSIS — Z6841 Body Mass Index (BMI) 40.0 and over, adult: Secondary | ICD-10-CM | POA: Diagnosis not present

## 2021-01-12 DIAGNOSIS — E559 Vitamin D deficiency, unspecified: Secondary | ICD-10-CM | POA: Diagnosis not present

## 2021-01-12 DIAGNOSIS — I890 Lymphedema, not elsewhere classified: Secondary | ICD-10-CM | POA: Diagnosis not present

## 2021-01-12 DIAGNOSIS — Z800 Family history of malignant neoplasm of digestive organs: Secondary | ICD-10-CM | POA: Diagnosis not present

## 2021-01-12 DIAGNOSIS — K5900 Constipation, unspecified: Secondary | ICD-10-CM

## 2021-01-12 DIAGNOSIS — D6861 Antiphospholipid syndrome: Secondary | ICD-10-CM | POA: Diagnosis not present

## 2021-01-12 MED ORDER — CLENPIQ 10-3.5-12 MG-GM -GM/160ML PO SOLN: 320.0000 mL | Freq: Once | ORAL | 0 refills | Status: AC

## 2021-01-12 NOTE — Progress Notes (Signed)
HPI : [name] is a very pleasant 55 year old female with a history of recurrent PE/DVT on Eliquis and history of elephantiasis who is referred for surveillance colonoscopy.  She underwent her initial screening colonoscopy in 2019 in which several polyps including a 10 mm polyp were removed and she was recommended to repeat colonoscopy in 3 years.  Her initial colonoscopy was incomplete due to poor bowel prep.  Pt states she was given GoLytely then, and vomited up much of it, which is why her prep was inadequate.  The repeat colonoscopy was done with Clenpiq which she tolerated well and resulted in an adequate prep.  Her father was diagnosed with colon cancer at age 50, and both his brother and mother were diagnosed with colon cancer in their 16s and 64s, respectively.  Her sister has a history of polyps. She has chronic constipation for which she drinks apple juice as needed.  She has a bowel movement on average every 4-5 days.  She used to take chocolate laxatives but they seemed to stop working .  She used to take Miralax, which did work, but she just stopped taking it. The patient has a history of PE x 2 and DVT, with her most recent clot occurring in 2018.  She states that when she had her colonoscopy, she was given lovenox injections the day before her colonoscopy while she held her Eliquis.  The patient developed severe lymphedema of her bilateral lower extremities.  This limits her physical activity, but she is ambulatory at home.  She does use a walker for ambulation outside of the home.  She has been working out with a trainer 2 times a week, 45 minutes a day with band exercises and weight resistance.  She has been able to lose 10 pounds since she started doing this about 3 months ago.  She denies any problems with chest pain or pressure.  She had an echocardiogram 2 years ago that showed normal ejection fraction.  Denies problems with significant dyspnea or shortness of breath.  Her BMI is  nearly 49, but much of her weight is secondary to her lymphedema.   Past Medical History:  Diagnosis Date   Anemia    Anxiety    Arthritis    Asthma    hx as child - no prob as adult - no inhaler   Blood transfusion 01/22/11   transfusion 2 units at [facility]   Depression 08/2010   psych assessment   Dyspnea    Fibroid    Headache(784.0)    rx for imitrex - last one jan   Keloid    Lymphedema    Pulmonary embolism [facility])      Past Surgical History:  Procedure Laterality Date   ABDOMINAL HYSTERECTOMY     COLONOSCOPY N/A 11/08/2017   Procedure: COLONOSCOPY;  Surgeon: Danie Binder, MD;  [facility];  Service: Endoscopy;  Laterality: N/A;  2:00pm   FLEXIBLE SIGMOIDOSCOPY N/A 09/17/2017   Procedure: FLEXIBLE SIGMOIDOSCOPY;  Surgeon: Danie Binder, MD;  [facility];  Service: Endoscopy;  Laterality: N/A;   HERNIA REPAIR  [id number]   umbilical   POLYPECTOMY  11/08/2017   Procedure: POLYPECTOMY;  Surgeon: Danie Binder, MD;  [facility];  Service: Endoscopy;;  ascending colon (CSx1), transverse colon (CS x1), splenic flexure (HSx1)   SVD     Spontaneous vaginal delivery; x 1   Family History  Problem Relation Age of Onset  Heart disease Mother    Hypertension Mother    Hypertension Father    Colon cancer Father 73       Passed away 45 yrs old   Hypertension Sister    Cancer Maternal Grandmother        gastric cancer   Cancer Maternal Grandfather        pancreatic cancer   Colon cancer Paternal Grandmother    Colon cancer Paternal Uncle    Colon polyps Neg Hx    Social History   Tobacco Use   Smoking status: Never   Smokeless tobacco: Never  Vaping Use   Vaping Use: Never used  Substance Use Topics   Alcohol use: No   Drug use: No   Current Outpatient Medications  Medication Sig Dispense Refill   acetaminophen (TYLENOL) 325 MG tablet Take 2 tablets (650 mg total) by mouth every 6 (six) hours as needed for mild pain (or Fever >/=  101). 60 tablet 0   albuterol (VENTOLIN HFA) 108 (90 Base) MCG/ACT inhaler Inhale 2 puffs into the lungs every 6 (six) hours as needed for wheezing or shortness of breath. 18 g 2   carvedilol (COREG) 6.25 MG tablet TAKE 1 TABLET BY MOUTH TWICE DAILY WITH A MEAL 180 tablet 0   CIMZIA PREFILLED 2 X 200 MG/ML PSKT Inject into the skin.     folic acid (FOLVITE) 1 MG tablet Take 1 tablet (1 mg total) by mouth daily. 90 tablet 3   methotrexate 50 MG/2ML injection Inject into the skin.     Misc. Devices (BARIATRIC ROLLATOR) MISC Please provide patient with insurance approved bariatric rollator walker. I89.0, R53.81, R26.81 1 each 0   MYRBETRIQ 50 MG TB24 tablet Take 50 mg by mouth daily.     apixaban (ELIQUIS) 5 MG TABS tablet Take 1 tablet (5 mg total) by mouth 2 (two) times daily. 180 tablet 3   No current facility-administered medications for this visit.   Allergies  Allergen Reactions   Abatacept Anaphylaxis    Other reaction(s): Anaphylaxis-Streptomycin Clickject   Bee Venom Anaphylaxis   Augmentin [Amoxicillin-Pot Clavulanate] Hives, Itching and Other (See Comments)    Did PCN reaction causing immediate rash, facial/tongue/throat swelling, SOB or lightheadedness with hypotension: yes Did PCN reaction causing severe rash involving mucus membranes or skin necrosis: no Has patient had a PCN reaction that required hospitalization: in hospital Has patient had a PCN reaction occurring within the last 10 years: no If all of the above answers are "NO", then may proceed with Cephalosporin use.  Pt reports no recollection of any reactions when taking penicillin in past   Latex Hives and Other (See Comments)    Local reaction (welts). Patient denies any wheezing or other reaction with latex exposure   Nulytely [Peg 3350-Kcl-Na Bicarb-Nacl]     NAUSEA AND VOMITING. MAY TOLERATE LOW VOLUME PREP.     Review of Systems: All systems reviewed and negative except where noted in HPI.    No results  found.  Physical Exam: BP (!) 160/98   Pulse (!) 103   Ht 6' (1.829 m)   Wt (!) 359 lb (162.8 kg)   LMP 01/31/2011   BMI 48.69 kg/m  Constitutional: Pleasant,well-developed, African-American female in no acute distress.  Able to stand from seated position independently, with some difficulty and climb onto exam table independently HEENT: Normocephalic and atraumatic. Conjunctivae are normal. No scleral icterus. Neck supple.  Large keloid scar on right side of neck Cardiovascular: Normal rate, regular  rhythm.  Pulmonary/chest: Effort normal and breath sounds normal. No wheezing, rales or rhonchi. Abdominal: Soft, nondistended, nontender. Bowel sounds active throughout. There are no masses palpable. No hepatomegaly. Extremities: Massive by lower extremity lymphedema with associated skin thickening/hyperpigmentation Neurological: Alert and oriented to person place and time. Skin: Skin is warm and dry. No rashes noted. Psychiatric: Normal mood and affect. Behavior is normal.  CBC    Component Value Date/Time   WBC 4.5 07/27/2020 1711   RBC 4.17 07/27/2020 1711   HGB 12.3 07/27/2020 1711   HGB 12.0 07/12/2020 1628   HCT 39.7 07/27/2020 1711   HCT 37.1 07/12/2020 1628   PLT 262 07/27/2020 1711   PLT 264 07/12/2020 1628   MCV 95.2 07/27/2020 1711   MCV 91 07/12/2020 1628   MCH 29.5 07/27/2020 1711   MCHC 31.0 07/27/2020 1711   RDW 15.5 07/27/2020 1711   RDW 14.0 07/12/2020 1628   LYMPHSABS 0.9 07/27/2020 1711   LYMPHSABS 1.5 03/09/2017 1421   MONOABS 0.8 07/27/2020 1711   EOSABS 0.2 07/27/2020 1711   EOSABS 0.2 03/09/2017 1421   BASOSABS 0.0 07/27/2020 1711   BASOSABS 0.0 03/09/2017 1421    CMP     Component Value Date/Time   NA 140 10/12/2020 1411   K 4.2 10/12/2020 1411   CL 104 10/12/2020 1411   CO2 20 10/12/2020 1411   GLUCOSE 88 10/12/2020 1411   GLUCOSE 120 (H) 07/27/2020 1711   BUN 10 10/12/2020 1411   CREATININE 0.94 10/12/2020 1411   CREATININE 0.89  05/13/2018 1520   CALCIUM 9.3 10/12/2020 1411   PROT 7.7 10/12/2020 1411   ALBUMIN 4.2 10/12/2020 1411   AST 22 10/12/2020 1411   AST 11 (L) 05/13/2018 1520   ALT 15 10/12/2020 1411   ALT 8 05/13/2018 1520   ALKPHOS 75 10/12/2020 1411   BILITOT 0.9 10/12/2020 1411   BILITOT 0.8 05/13/2018 1520   GFRNONAA >60 07/27/2020 1711   GFRNONAA >60 05/13/2018 1520   GFRAA 82 01/16/2020 1605   GFRAA >60 05/13/2018 1520     ASSESSMENT AND PLAN: 55 year old female with extensive family history of colon cancer (1 first-degree relative, 2 second-degree relatives) and personal history of colon polyps (including 10 mm adenoma) due for surveillance colonoscopy.  She has a history of multiple venous thromboses and is on lifelong Eliquis.  Her BMI is nearly 43, but much of her excess weight is secondary to her massive bilateral lower extremity lymphedema.  She has no known cardiopulmonary comorbidities.  I think she is a reasonable candidate for a surveillance colonoscopy in the endoscopy suite.  I would recommend holding her Eliquis the day before her procedure.  We will reach out to her hematologist to see if they still recommend doing a Lovenox bridge while her Eliquis is being held. For her constipation, I recommended that she resume taking MiraLAX on a daily basis.  She can continue to take the apple juice as needed if this is effective for her constipation, but daily MiraLAX will provide her with a more regular bowel regimen  Personal history of colon polyps/family history of colon cancer - Surveillance colonoscopy -Plan to hold Eliquis day before procedure.  We will reach out to patient's hematologist, Dr. Burr Medico for further guidance on management of anticoagulation in the periprocedural setting  Constipation - Daily MiraLAX  History of multiple pulmonary embolisms/DVT - See above regarding Eliquis management  Lymphedema - Artificially elevating patient's BMI, feel patient is reasonable candidate  for [facility]  The details, risks (including bleeding, perforation, infection, missed lesions, medication reactions and possible hospitalization or surgery if complications occur), benefits, and alternatives to colonoscopy with possible biopsy and possible polypectomy were discussed with the patient and she consents to proceed.    Bonnie Roig E. Candis Schatz, MD [facility]  CC:  Gildardo Pounds, NP

## 2021-01-12 NOTE — Telephone Encounter (Signed)
   [name] [birth year] [id number]  Dear [name] ,NP:  We have scheduled the above named patient for a(n) Colonoscopy procedure. Our records show that (s)he is on anticoagulation therapy.  Please advise as to whether the patient may come off their therapy of Eliquis 1 days prior to their procedure which is scheduled for 02/25/21.  Please route your response to [facility] or fax response to [phone].  Sincerely,    [facility]

## 2021-01-12 NOTE — Patient Instructions (Signed)
If you are age 53 or older, your body mass index should be between 23-30. Your Body mass index is 48.69 kg/m. If this is out of the aforementioned range listed, please consider follow up with your Primary Care Provider.  If you are age 26 or younger, your body mass index should be between 19-25. Your Body mass index is 48.69 kg/m. If this is out of the aformentioned range listed, please consider follow up with your Primary Care Provider.   You have been scheduled for a colonoscopy. Please follow written instructions given to you at your visit today.  Please pick up your prep supplies at the pharmacy within the next 1-3 days. If you use inhalers (even only as needed), please bring them with you on the day of your procedure.   The [facility] providers would like to encourage you to use [facility] to communicate with providers for non-urgent requests or questions.  Due to long hold times on the telephone, sending your provider a message by [facility] may be a faster and more efficient way to get a response.  Please allow 48 business hours for a response.  Please remember that this is for non-urgent requests.   It was a pleasure to see you today!  Thank you for trusting me with your gastrointestinal care!    Scott E. Candis Schatz, MD

## 2021-01-14 ENCOUNTER — Ambulatory Visit: Payer: Medicare HMO | Attending: Nurse Practitioner | Admitting: Nurse Practitioner

## 2021-01-14 ENCOUNTER — Encounter: Payer: Self-pay | Admitting: Nurse Practitioner

## 2021-01-14 ENCOUNTER — Other Ambulatory Visit: Payer: Self-pay

## 2021-01-14 VITALS — BP 128/78 | HR 73 | Ht 72.0 in | Wt 368.5 lb

## 2021-01-14 DIAGNOSIS — Z23 Encounter for immunization: Secondary | ICD-10-CM | POA: Diagnosis not present

## 2021-01-14 DIAGNOSIS — I10 Essential (primary) hypertension: Secondary | ICD-10-CM

## 2021-01-14 MED ORDER — ZOSTER VAC RECOMB ADJUVANTED 50 MCG/0.5ML IM SUSR: 0.5000 mL | Freq: Once | INTRAMUSCULAR | 1 refills | Status: DC

## 2021-01-14 MED ORDER — ZOSTER VAC RECOMB ADJUVANTED 50 MCG/0.5ML IM SUSR: 0.5000 mL | Freq: Once | INTRAMUSCULAR | 1 refills | Status: AC

## 2021-01-14 NOTE — Addendum Note (Signed)
Addended by: [name] on: 01/14/2021 03:35 PM   Modules accepted: Orders

## 2021-01-14 NOTE — Telephone Encounter (Signed)
May stop Eliquis 1 day prior to procedure. If no polypectomy or polyps <24mm removed may resume Eliquis the evening of procedure.

## 2021-01-14 NOTE — Progress Notes (Signed)
Assessment & Plan:  [name] was seen today for prediabetes.  Diagnoses and all orders for this visit:  Primary hypertension Continue carvedilol as prescribed.   Patient has been counseled on age-appropriate routine health concerns for screening and prevention. These are reviewed and up-to-date. Referrals have been placed accordingly. Immunizations are up-to-date or declined.    Subjective:   Chief Complaint  Patient presents with   Prediabetes   Hypertension   HPI [name] 55 y.o. female presents to office today for follow up to HTN.  She has a past medical history of Anemia, Anxiety, OA,  Asthma, Urinary incontinence with foley in place,  Depression (08/2010), Uterine fibroids, Keloid, Lymphedema, and Pulmonary embolism.  FLU Like Symptoms States her daughter was diagnosed with the flu a few weeks ago then her granddaughter who spends time at her home was also sick. Today she endorses fatigue, body aches, chills and just overall not feeling well.    HTN Blood pressure is well controlled. She is currently taking carvedilol 6.25 mg BID. She does not monitor her blood pressure at home although she does have a blood pressure monitor.  BP Readings from Last 3 Encounters:  01/14/21 128/78  01/12/21 (!) 160/98  10/12/20 130/89     Review of Systems  Constitutional:  Negative for fever, malaise/fatigue and weight loss.  HENT: Negative.  Negative for nosebleeds.   Eyes: Negative.  Negative for blurred vision, double vision and photophobia.  Respiratory: Negative.  Negative for cough and shortness of breath.   Cardiovascular: Negative.  Negative for chest pain, palpitations and leg swelling.  Gastrointestinal: Negative.  Negative for heartburn, nausea and vomiting.  Musculoskeletal: Negative.  Negative for myalgias.  Neurological: Negative.  Negative for dizziness, focal weakness, seizures and headaches.  Psychiatric/Behavioral: Negative.  Negative for suicidal ideas.     Past Medical History:  Diagnosis Date   Anemia    Anxiety    Arthritis    Asthma    hx as child - no prob as adult - no inhaler   Blood transfusion 01/22/11   transfusion 2 units at [facility]   Depression 08/2010   psych assessment   Dyspnea    Fibroid    Headache(784.0)    rx for imitrex - last one jan   Keloid    Lymphedema    Pulmonary embolism [facility])     Past Surgical History:  Procedure Laterality Date   ABDOMINAL HYSTERECTOMY     COLONOSCOPY N/A 11/08/2017   Procedure: COLONOSCOPY;  Surgeon: Danie Binder, MD;  [facility];  Service: Endoscopy;  Laterality: N/A;  2:00pm   FLEXIBLE SIGMOIDOSCOPY N/A 09/17/2017   Procedure: FLEXIBLE SIGMOIDOSCOPY;  Surgeon: Danie Binder, MD;  [facility];  Service: Endoscopy;  Laterality: N/A;   HERNIA REPAIR  [id number]   umbilical   POLYPECTOMY  11/08/2017   Procedure: POLYPECTOMY;  Surgeon: Danie Binder, MD;  [facility];  Service: Endoscopy;;  ascending colon (CSx1), transverse colon (CS x1), splenic flexure (HSx1)   SVD     Spontaneous vaginal delivery; x 1    Family History  Problem Relation Age of Onset   Heart disease Mother    Hypertension Mother    Hypertension Father    Colon cancer Father 2       Passed away 22 yrs old   Hypertension Sister    Cancer Maternal Grandmother        gastric cancer   Cancer Maternal Grandfather  pancreatic cancer   Colon cancer Paternal Grandmother    Colon cancer Paternal Uncle    Colon polyps Neg Hx     Social History Reviewed with no changes to be made today.   Outpatient Medications Prior to Visit  Medication Sig Dispense Refill   acetaminophen (TYLENOL) 325 MG tablet Take 2 tablets (650 mg total) by mouth every 6 (six) hours as needed for mild pain (or Fever >/= 101). 60 tablet 0   albuterol (VENTOLIN HFA) 108 (90 Base) MCG/ACT inhaler Inhale 2 puffs into the lungs every 6 (six) hours as needed for wheezing or shortness of breath. 18 g 2    carvedilol (COREG) 6.25 MG tablet TAKE 1 TABLET BY MOUTH TWICE DAILY WITH A MEAL 180 tablet 0   CIMZIA PREFILLED 2 X 200 MG/ML PSKT Inject into the skin.     folic acid (FOLVITE) 1 MG tablet Take 1 tablet (1 mg total) by mouth daily. 90 tablet 3   methotrexate 50 MG/2ML injection Inject into the skin.     Misc. Devices (BARIATRIC ROLLATOR) MISC Please provide patient with insurance approved bariatric rollator walker. I89.0, R53.81, R26.81 1 each 0   MYRBETRIQ 50 MG TB24 tablet Take 50 mg by mouth daily.     apixaban (ELIQUIS) 5 MG TABS tablet Take 1 tablet (5 mg total) by mouth 2 (two) times daily. 180 tablet 3   No facility-administered medications prior to visit.    Allergies  Allergen Reactions   Abatacept Anaphylaxis    Other reaction(s): Anaphylaxis-Streptomycin Clickject   Bee Venom Anaphylaxis   Augmentin [Amoxicillin-Pot Clavulanate] Hives, Itching and Other (See Comments)    Did PCN reaction causing immediate rash, facial/tongue/throat swelling, SOB or lightheadedness with hypotension: yes Did PCN reaction causing severe rash involving mucus membranes or skin necrosis: no Has patient had a PCN reaction that required hospitalization: in hospital Has patient had a PCN reaction occurring within the last 10 years: no If all of the above answers are "NO", then may proceed with Cephalosporin use.  Pt reports no recollection of any reactions when taking penicillin in past   Latex Hives and Other (See Comments)    Local reaction (welts). Patient denies any wheezing or other reaction with latex exposure   Nulytely [Peg 3350-Kcl-Na Bicarb-Nacl]     NAUSEA AND VOMITING. MAY TOLERATE LOW VOLUME PREP.       Objective:    BP 128/78 (BP Location: Right Arm)   Pulse 73   Ht 6' (1.829 m)   Wt (!) 368 lb 8 oz (167.2 kg)   LMP 01/31/2011   SpO2 99%   BMI 49.98 kg/m  Wt Readings from Last 3 Encounters:  01/14/21 (!) 368 lb 8 oz (167.2 kg)  01/12/21 (!) 359 lb (162.8 kg)  10/12/20  (!) 374 lb 3.2 oz (169.7 kg)    Physical Exam Vitals and nursing note reviewed.  Constitutional:      Appearance: She is well-developed.  HENT:     Head: Normocephalic and atraumatic.  Cardiovascular:     Rate and Rhythm: Normal rate and regular rhythm.     Heart sounds: Normal heart sounds. No murmur heard.   No friction rub. No gallop.  Pulmonary:     Effort: Pulmonary effort is normal. No tachypnea or respiratory distress.     Breath sounds: Normal breath sounds. No decreased breath sounds, wheezing, rhonchi or rales.  Chest:     Chest wall: No tenderness.  Abdominal:     General: Bowel  sounds are normal.     Palpations: Abdomen is soft.  Musculoskeletal:        General: Normal range of motion.     Cervical back: Normal range of motion.  Skin:    General: Skin is warm and dry.  Neurological:     Mental Status: She is alert and oriented to person, place, and time.     Coordination: Coordination normal.     Comments: Uses rolling walker to assist with mobility  Psychiatric:        Behavior: Behavior normal. Behavior is cooperative.        Thought Content: Thought content normal.        Judgment: Judgment normal.         Patient has been counseled extensively about nutrition and exercise as well as the importance of adherence with medications and regular follow-up. The patient was given clear instructions to go to ER or return to medical center if symptoms don't improve, worsen or new problems develop. The patient verbalized understanding.   Follow-up: Return in about 3 months (around 04/16/2021).   Gildardo Pounds, FNP-BC [facility] [place], [place]   01/14/2021, 2:11 PM

## 2021-01-14 NOTE — Addendum Note (Signed)
Addended by: Barkley Bruns on: 01/14/2021 02:32 PM   Modules accepted: Orders

## 2021-01-14 NOTE — Addendum Note (Signed)
Addended by: Barkley Bruns on: 01/14/2021 02:53 PM   Modules accepted: Orders

## 2021-01-15 LAB — CMP14+EGFR
ALT: 11 IU/L (ref 0–32)
AST: 18 IU/L (ref 0–40)
Albumin/Globulin Ratio: 1.4 (ref 1.2–2.2)
Albumin: 4.4 g/dL (ref 3.8–4.9)
Alkaline Phosphatase: 71 IU/L (ref 44–121)
BUN/Creatinine Ratio: 14 (ref 9–23)
BUN: 11 mg/dL (ref 6–24)
Bilirubin Total: 1.1 mg/dL (ref 0.0–1.2)
CO2: 23 mmol/L (ref 20–29)
Calcium: 9.8 mg/dL (ref 8.7–10.2)
Chloride: 106 mmol/L (ref 96–106)
Creatinine, Ser: 0.81 mg/dL (ref 0.57–1.00)
Globulin, Total: 3.2 g/dL (ref 1.5–4.5)
Glucose: 107 mg/dL — ABNORMAL HIGH (ref 70–99)
Potassium: 4 mmol/L (ref 3.5–5.2)
Sodium: 144 mmol/L (ref 134–144)
Total Protein: 7.6 g/dL (ref 6.0–8.5)
eGFR: 87 mL/min/{1.73_m2} (ref >59)

## 2021-01-27 NOTE — Telephone Encounter (Signed)
Called patient and informed her to hold eliquis 1 day prior to her procedure. Patient stated understanding and had no questions at the end of call.

## 2021-02-21 ENCOUNTER — Emergency Department (HOSPITAL_BASED_OUTPATIENT_CLINIC_OR_DEPARTMENT_OTHER): Payer: Medicare HMO

## 2021-02-21 ENCOUNTER — Emergency Department (HOSPITAL_BASED_OUTPATIENT_CLINIC_OR_DEPARTMENT_OTHER)
Admission: EM | Admit: 2021-02-21 | Discharge: 2021-02-21 | Disposition: A | Discharge status: Home / Self Care | Payer: Medicare HMO | Attending: Emergency Medicine | Admitting: Emergency Medicine

## 2021-02-21 ENCOUNTER — Encounter (HOSPITAL_BASED_OUTPATIENT_CLINIC_OR_DEPARTMENT_OTHER): Payer: Self-pay

## 2021-02-21 ENCOUNTER — Other Ambulatory Visit: Payer: Self-pay

## 2021-02-21 DIAGNOSIS — M722 Plantar fascial fibromatosis: Secondary | ICD-10-CM | POA: Diagnosis not present

## 2021-02-21 DIAGNOSIS — Z23 Encounter for immunization: Secondary | ICD-10-CM | POA: Insufficient documentation

## 2021-02-21 DIAGNOSIS — I890 Lymphedema, not elsewhere classified: Secondary | ICD-10-CM | POA: Insufficient documentation

## 2021-02-21 DIAGNOSIS — Z7901 Long term (current) use of anticoagulants: Secondary | ICD-10-CM | POA: Insufficient documentation

## 2021-02-21 DIAGNOSIS — Z91040 Latex allergy status: Secondary | ICD-10-CM | POA: Diagnosis not present

## 2021-02-21 DIAGNOSIS — R600 Localized edema: Secondary | ICD-10-CM | POA: Diagnosis not present

## 2021-02-21 DIAGNOSIS — S91332A Puncture wound without foreign body, left foot, initial encounter: Secondary | ICD-10-CM

## 2021-02-21 DIAGNOSIS — M79672 Pain in left foot: Secondary | ICD-10-CM | POA: Diagnosis present

## 2021-02-21 DIAGNOSIS — Z79899 Other long term (current) drug therapy: Secondary | ICD-10-CM | POA: Diagnosis not present

## 2021-02-21 DIAGNOSIS — J45909 Unspecified asthma, uncomplicated: Secondary | ICD-10-CM | POA: Diagnosis not present

## 2021-02-21 MED ORDER — CEPHALEXIN 500 MG PO CAPS: 1000.0000 mg | ORAL_CAPSULE | Freq: Two times a day (BID) | ORAL | 0 refills | Status: DC

## 2021-02-21 MED ORDER — TETANUS-DIPHTH-ACELL PERTUSSIS 5-2.5-18.5 LF-MCG/0.5 IM SUSY
0.5000 mL | PREFILLED_SYRINGE | Freq: Once | INTRAMUSCULAR | Status: AC
  Administered 2021-02-21: 23:00:00 0.5 mL via INTRAMUSCULAR
  Filled 2021-02-21: qty 0.5

## 2021-02-21 MED ORDER — IBUPROFEN 400 MG PO TABS
600.0000 mg | ORAL_TABLET | Freq: Once | ORAL | Status: AC
  Administered 2021-02-21: 23:00:00 600 mg via ORAL
  Filled 2021-02-21: qty 1

## 2021-02-21 NOTE — ED Provider Notes (Signed)
[facility] DEPT Provider Note   CSN: [id number] Arrival date & time: 02/21/21  1815     History Chief Complaint  Patient presents with   Foot Pain    [name] is a 55 y.o. female.  Patient with hx lymphedema, presents indicating feels may have plantar fasciitis of right foot, and in past few weeks, pain to right heel, worse in AM, and when first stands on - pain dull, by anterior heel, moderate, comes and goes, worse w wt bearing. Denies increased swelling to right foot, no skin changes or redness. No trauma.  Also states 5 days ago, stepped on something sharp with left foot, had socks on, isnt sure what stepped on, transiently bled, now w mild soreness to area. No pus from area. No fever or chills. Unsure when last tetanus, thinks more than 10 yrs.   The history is provided by the patient and medical records.  Foot Pain Pertinent negatives include no chest pain and no shortness of breath.      Past Medical History:  Diagnosis Date   Anemia    Anxiety    Arthritis    Asthma    hx as child - no prob as adult - no inhaler   Blood transfusion 01/22/11   transfusion 2 units at [facility]   Depression 08/2010   psych assessment   Dyspnea    Fibroid    Headache(784.0)    rx for imitrex - last one jan   Keloid    Lymphedema    Pulmonary embolism [facility])     Patient Active Problem List   Diagnosis Date Noted   Body mass index (BMI) 45.0-49.9, adult ([place]) 08/20/2020   Chronic fatigue syndrome 08/20/2020   Polyarthralgia 08/20/2020   Vitamin D deficiency 08/20/2020   Cellulitis 12/31/2018   Elephantiasis 12/31/2018   Ischemic chest pain ([place]) 12/31/2018   History of pulmonary embolus (PE) 12/31/2018   Physical deconditioning 08/27/2018   Adenopathy 09/15/2017   Family history of colon cancer 08/01/2017   Taking multiple medications for chronic disease 08/01/2017   Arthritis    Axillary lymphadenopathy 01/26/2017   Urinary incontinence  01/25/2017   Hypokalemia 01/25/2017   Lobar pneumonia ([place]) 01/25/2017   Pressure ulcer of sacral region, stage 2 ([place]) 01/25/2017   Abnormal ECG 01/29/2013   Dyspnea    Menorrhagia with regular cycle 02/22/2011   Anemia 02/22/2011    Past Surgical History:  Procedure Laterality Date   ABDOMINAL HYSTERECTOMY     COLONOSCOPY N/A 11/08/2017   Procedure: COLONOSCOPY;  Surgeon: Danie Binder, MD;  Location: [facility];  Service: Endoscopy;  Laterality: N/A;  2:00pm   FLEXIBLE SIGMOIDOSCOPY N/A 09/17/2017   Procedure: FLEXIBLE SIGMOIDOSCOPY;  Surgeon: Danie Binder, MD;  Location: [facility];  Service: Endoscopy;  Laterality: N/A;   HERNIA REPAIR  2423   umbilical   POLYPECTOMY  11/08/2017   Procedure: POLYPECTOMY;  Surgeon: Danie Binder, MD;  Location: [facility];  Service: Endoscopy;;  ascending colon (CSx1), transverse colon (CS x1), splenic flexure (HSx1)   SVD     Spontaneous vaginal delivery; x 1     OB History     Gravida  3   Para  1   Term  1   Preterm      AB  2   Living  1      SAB  1   IAB  1   Ectopic      Multiple  Live Births              Family History  Problem Relation Age of Onset   Heart disease Mother    Hypertension Mother    Hypertension Father    Colon cancer Father 35       Passed away 28 yrs old   Hypertension Sister    Cancer Maternal Grandmother        gastric cancer   Cancer Maternal Grandfather        pancreatic cancer   Colon cancer Paternal Grandmother    Colon cancer Paternal Uncle    Colon polyps Neg Hx     Social History   Tobacco Use   Smoking status: Never   Smokeless tobacco: Never  Vaping Use   Vaping Use: Never used  Substance Use Topics   Alcohol use: No   Drug use: No    Home Medications Prior to Admission medications   Medication Sig Start Date End Date Taking? Authorizing Provider  acetaminophen (TYLENOL) 325 MG tablet Take 2 tablets (650 mg total) by mouth every 6 (six)  hours as needed for mild pain (or Fever >/= 101). 01/06/19   Shelly Coss, MD  albuterol (VENTOLIN HFA) 108 (90 Base) MCG/ACT inhaler Inhale 2 puffs into the lungs every 6 (six) hours as needed for wheezing or shortness of breath. 07/12/20   Gildardo Pounds, NP  apixaban (ELIQUIS) 5 MG TABS tablet Take 1 tablet (5 mg total) by mouth 2 (two) times daily. 10/12/20 01/10/21  Gildardo Pounds, NP  carvedilol (COREG) 6.25 MG tablet TAKE 1 TABLET BY MOUTH TWICE DAILY WITH A MEAL 11/29/20   Gildardo Pounds, NP  CIMZIA PREFILLED 2 X 200 MG/ML PSKT Inject into the skin. 08/09/20   [provider]  folic acid (FOLVITE) 1 MG tablet Take 1 tablet (1 mg total) by mouth daily. 10/12/20   Gildardo Pounds, NP  methotrexate 50 MG/2ML injection Inject into the skin. 07/27/20   [provider]  [place]. Devices (BARIATRIC ROLLATOR) MISC Please provide patient with insurance approved bariatric rollator walker. I89.0, R53.81, R26.81 11/05/19   Gildardo Pounds, NP  MYRBETRIQ 50 MG TB24 tablet Take 50 mg by mouth daily. 11/24/20   [provider]    Allergies    Abatacept, Bee venom, Augmentin [amoxicillin-pot clavulanate], Latex, and Nulytely [peg 2841-LKG-MW bicarb-nacl]  Review of Systems   Review of Systems  Constitutional:  Negative for chills and fever.  Eyes:  Negative for redness.  Respiratory:  Negative for shortness of breath.   Cardiovascular:  Negative for chest pain.  Musculoskeletal:        Bilateral plantar aspect foot pain - see hpi  Skin:        Recent stepped on something with left foot, no open wound.   Neurological:  Negative for numbness.   Physical Exam Updated Vital Signs BP (!) 154/77 (BP Location: Left Arm)    Pulse 76    Temp 98 F (36.7 C) (Oral)    Resp 20    Ht 1.829 m (6')    Wt (!) 161.9 kg    LMP 01/31/2011    SpO2 100%    BMI 48.42 kg/m   Physical Exam Vitals and nursing note reviewed.  Constitutional:      Appearance: Normal appearance. She is  well-developed.  HENT:     Head: Atraumatic.     Nose: Nose normal.     Mouth/Throat:     Mouth:  Mucous membranes are moist.  Eyes:     General: No scleral icterus.    Conjunctiva/sclera: Conjunctivae normal.  Neck:     Trachea: No tracheal deviation.  Cardiovascular:     Rate and Rhythm: Normal rate and regular rhythm.     Pulses: Normal pulses.  Pulmonary:     Effort: Pulmonary effort is normal. No respiratory distress.  Abdominal:     General: There is no distension.  Genitourinary:    Comments: No cva tenderness.  Musculoskeletal:     Cervical back: Neck supple. No muscular tenderness.     Comments: Chronic lymphedema - pt indicates appears at baseline. Tenderness anterior aspect right heel. Normal cap refill in toes. Foot of normal warmth and color.  Tenderness to small area plantar aspect left midfoot. ?slight erythema to area. No fluctuance. No fb seen or felt. No open wounds. Normal color and warmth of foot. Normal cap refill in toes.   Skin:    General: Skin is warm and dry.     Findings: No rash.  Neurological:     Mental Status: She is alert.     Comments: Alert, speech normal. Bil feet nvi.   Psychiatric:        Mood and Affect: Mood normal.    ED Results / Procedures / Treatments   Labs (all labs ordered are listed, but only abnormal results are displayed) Labs Reviewed - No data to display  EKG None  Radiology DG Foot Complete Left  Result Date: 02/21/2021 CLINICAL DATA:  Rule out foreign body. EXAM: LEFT FOOT - COMPLETE 3+ VIEW COMPARISON:  None. FINDINGS: Evaluation is limited due to soft tissue edema and osteopenia. No definite acute fracture. No dislocation. The bones are osteopenic. Degenerative changes of the mid and hindfoot. There is pes planus. Diffuse subcutaneous edema and soft tissue thickening, likely lymphedema. No radiopaque foreign object or soft tissue gas. IMPRESSION: 1. No acute osseous pathology. No radiopaque foreign object or soft  tissue gas. 2. Osteopenia and degenerative changes. Electronically Signed   By: Anner Crete M.D.   On: 02/21/2021 23:04    Procedures Procedures   Medications Ordered in ED Medications  Tdap (BOOSTRIX) injection 0.5 mL (has no administration in time range)  ibuprofen (ADVIL) tablet 600 mg (has no administration in time range)    ED Course  I have reviewed the triage vital signs and the nursing notes.  Pertinent labs & imaging results that were available during my care of the patient were reviewed by me and considered in my medical decision making (see chart for details).    MDM Rules/Calculators/A&P                         Imaging ordered.  Reviewed nursing notes and prior charts for additional history.   Xrays reviewed/interpreted by me - no fb seen.   Tetanus im. Ibuprofen po.   As recent puncture wound, tenderness, will place on course of abx.   Return precautions provided.     Final Clinical Impression(s) / ED Diagnoses Final diagnoses:  None    Rx / DC Orders ED Discharge Orders     None        Lajean Saver, MD 02/21/21 2316

## 2021-02-21 NOTE — ED Triage Notes (Signed)
Pt c/o bilateral foot pain. Pt denies trauma to R foot, but states she stepped on something sharp with her L foot on 12/21. Pt reports exacerbation of lymphedema to L leg with weeping wounds.

## 2021-02-21 NOTE — Discharge Instructions (Signed)
It was our pleasure to provide your ER care today - we hope that you feel better.  Take acetaminophen and ibuprofen as need for pain.  Consider foot orthotic device for comfort/support. Follow up closely with primary care doctor/foot specialist in the next 1-2 weeks.   For puncture wound, take antibiotic as prescribed.   Return to ER if worse, new symptoms, fevers, increased swelling, spreading redness, severe pain, or other concern.

## 2021-02-23 ENCOUNTER — Telehealth: Payer: Self-pay | Admitting: Gastroenterology

## 2021-02-23 NOTE — Telephone Encounter (Signed)
Hey Dr. Candis Schatz,   Patient called in to cancel procedure for 12/30 due to her having the flu. Patient states she will call back to reschedule.   Thank you

## 2021-02-25 ENCOUNTER — Encounter: Payer: Medicare HMO | Admitting: Gastroenterology

## 2021-03-04 ENCOUNTER — Ambulatory Visit: Payer: Medicare HMO

## 2021-03-09 ENCOUNTER — Ambulatory Visit: Payer: Medicare HMO

## 2021-03-09 ENCOUNTER — Ambulatory Visit: Payer: Medicare HMO | Admitting: Podiatry

## 2021-03-09 ENCOUNTER — Other Ambulatory Visit: Payer: Self-pay

## 2021-03-09 DIAGNOSIS — D649 Anemia, unspecified: Secondary | ICD-10-CM | POA: Insufficient documentation

## 2021-03-09 DIAGNOSIS — M79675 Pain in left toe(s): Secondary | ICD-10-CM | POA: Diagnosis not present

## 2021-03-09 DIAGNOSIS — D509 Iron deficiency anemia, unspecified: Secondary | ICD-10-CM | POA: Insufficient documentation

## 2021-03-09 DIAGNOSIS — B351 Tinea unguium: Secondary | ICD-10-CM | POA: Diagnosis not present

## 2021-03-09 DIAGNOSIS — M79674 Pain in right toe(s): Secondary | ICD-10-CM

## 2021-03-09 DIAGNOSIS — K5900 Constipation, unspecified: Secondary | ICD-10-CM | POA: Insufficient documentation

## 2021-03-09 DIAGNOSIS — I890 Lymphedema, not elsewhere classified: Secondary | ICD-10-CM

## 2021-03-09 DIAGNOSIS — Z1211 Encounter for screening for malignant neoplasm of colon: Secondary | ICD-10-CM | POA: Insufficient documentation

## 2021-03-09 NOTE — Progress Notes (Signed)
SITUATION Reason for Consult: Evaluation for Prefabricated Diabetic Shoes and Bilateral Custom Diabetic Inserts. Patient / Caregiver Report: Patient would like well fitting shoes  OBJECTIVE DATA: Patient History / Diagnosis:    ICD-10-CM   1. Lymphedema of both lower extremities  I89.0       Current or Previous Devices:   628-E edema shoes  In-Person Foot Examination: Ulcers & Callousing:   None  Toe / Foot Deformities:   - Pes Planus, severe edema   Shoe Size: 14XW  ORTHOTIC RECOMMENDATION Recommended Devices: - 1x pair prefabricated PDAC approved diabetic shoes: A3200W 14XW - 1x pair custom-to-patient vacuum formed diabetic insoles.   GOALS OF SHOES AND INSOLES - Reduce shear and pressure - Reduce / Prevent callus formation - Reduce / Prevent ulceration - Protect the fragile healing compromised diabetic foot.  Patient would benefit from diabetic shoes and inserts as patient has diabetes mellitus and the patient has one or more of the following conditions: - History of partial or complete amputation of the foot - History of previous foot ulceration. - History of pre-ulcerative callus - Peripheral neuropathy with evidence of callus formation - Foot deformity - Poor circulation  ACTIONS PERFORMED Patient was casted for insoles via crush box and measured for shoes via brannock device. Procedure was explained and patient tolerated procedure well. All questions were answered and concerns addressed.  PLAN Patient is to ensure treating physician receives and completes diabetic paperwork. Casts and shoe order are to be held until paperwork is received. Once received patient is to be scheduled for fitting in four weeks.

## 2021-03-13 ENCOUNTER — Encounter: Payer: Self-pay | Admitting: Podiatry

## 2021-03-13 NOTE — Progress Notes (Signed)
°  Subjective:  Patient ID: [name], female    DOB: 1965-08-25,  MRN: [id number]  [name] presents to clinic today for painful elongated mycotic toenails 1-5 bilaterally which are tender when wearing enclosed shoe gear. Pain is relieved with periodic professional debridement.  Patient has h/o lymphedema b/l LE. She is seeing Pedorthist today for new custom shoes.  Patient states she has been working out in the gym twice weekly. She has lost over 60 pounds.  PCP is Gildardo Pounds, NP , and last visit was 01/14/2021.  Allergies  Allergen Reactions   Abatacept Anaphylaxis    Other reaction(s): Anaphylaxis-Streptomycin Clickject Other reaction(s): Anaphylaxis-Streptomycin   Bee Venom Anaphylaxis   Augmentin [Amoxicillin-Pot Clavulanate] Hives, Itching and Other (See Comments)    Did PCN reaction causing immediate rash, facial/tongue/throat swelling, SOB or lightheadedness with hypotension: yes Did PCN reaction causing severe rash involving mucus membranes or skin necrosis: no Has patient had a PCN reaction that required hospitalization: in hospital Has patient had a PCN reaction occurring within the last 10 years: no If all of the above answers are "NO", then may proceed with Cephalosporin use.  Pt reports no recollection of any reactions when taking penicillin in past   Latex Hives and Other (See Comments)    Local reaction (welts). Patient denies any wheezing or other reaction with latex exposure   Nulytely [Peg 3350-Kcl-Na Bicarb-Nacl]     NAUSEA AND VOMITING. MAY TOLERATE LOW VOLUME PREP.    Review of Systems: Negative except as noted in the HPI. Objective:   Constitutional [name] is a pleasant 56 y.o. African American female, morbidly obese in NAD. AAO x 3.   Vascular Capillary refill time to digits immediate b/l. Palpable pedal pulses b/l LE. Pedal hair absent. No pain with calf compression b/l. Lymphedema present BLE. No ischemia or gangrene noted  b/l LE. No cyanosis or clubbing noted b/l LE.  Neurologic Normal speech. Oriented to person, place, and time. Protective sensation intact 5/5 intact bilaterally with 10g monofilament b/l. Vibratory sensation intact b/l.  Dermatologic No open wounds b/l LE. No interdigital macerations noted b/l LE. Toenails 1-5 b/l elongated, discolored, dystrophic, thickened, crumbly with subungual debris and tenderness to dorsal palpation. Skin b/l lower extremities noted to be thickened and brawny consistent with lymphedema.  Orthopedic: Muscle strength 5/5 to all lower extremity muscle groups bilaterally. Sausage digits 1-5 b/l. Utilizes rollator for ambulation assistance.   Radiographs: None  Last A1c:  Hemoglobin A1C Latest Ref Rng & Units 10/12/2020 07/12/2020  HGBA1C 4.8 - 5.6 % 6.0(H) 6.4(H)  Some recent data might be hidden   Assessment:   1. Pain due to onychomycosis of toenails of both feet    Plan:  Patient was evaluated and treated and all questions answered. Consent given for treatment as described below: -Patient seeing Pedorthist today for casting for new pair of custom shoes. -Mycotic toenails 1-5 bilaterally were debrided in length and girth with sterile nail nippers and dremel without incident. -Patient/POA to call should there be question/concern in the interim.  Return in about 3 months (around 06/07/2021).  Marzetta Board, DPM

## 2021-03-15 ENCOUNTER — Ambulatory Visit: Payer: Medicare HMO

## 2021-04-05 ENCOUNTER — Telehealth: Payer: Self-pay

## 2021-04-05 NOTE — Telephone Encounter (Signed)
Called to cancel patient's appointment due to insoles not being ready in time. Patient understands and is ok to wait. All questions answered and concerns addressed. Patient to be scheduled when inserts ready.

## 2021-04-07 ENCOUNTER — Encounter (HOSPITAL_COMMUNITY): Payer: Self-pay

## 2021-04-07 ENCOUNTER — Emergency Department (HOSPITAL_COMMUNITY): Payer: Medicare HMO

## 2021-04-07 ENCOUNTER — Observation Stay (HOSPITAL_COMMUNITY)
Admission: EM | Admit: 2021-04-07 | Discharge: 2021-04-08 | Disposition: A | Discharge status: Home / Self Care | Payer: Medicare HMO | Attending: Internal Medicine | Admitting: Internal Medicine

## 2021-04-07 DIAGNOSIS — Z86711 Personal history of pulmonary embolism: Secondary | ICD-10-CM | POA: Diagnosis not present

## 2021-04-07 DIAGNOSIS — R0602 Shortness of breath: Secondary | ICD-10-CM | POA: Diagnosis not present

## 2021-04-07 DIAGNOSIS — Z91040 Latex allergy status: Secondary | ICD-10-CM | POA: Diagnosis not present

## 2021-04-07 DIAGNOSIS — Z7901 Long term (current) use of anticoagulants: Secondary | ICD-10-CM | POA: Insufficient documentation

## 2021-04-07 DIAGNOSIS — R42 Dizziness and giddiness: Secondary | ICD-10-CM | POA: Diagnosis not present

## 2021-04-07 DIAGNOSIS — Z20822 Contact with and (suspected) exposure to covid-19: Secondary | ICD-10-CM | POA: Diagnosis not present

## 2021-04-07 DIAGNOSIS — I3139 Other pericardial effusion (noninflammatory): Secondary | ICD-10-CM | POA: Diagnosis not present

## 2021-04-07 DIAGNOSIS — R072 Precordial pain: Principal | ICD-10-CM | POA: Insufficient documentation

## 2021-04-07 DIAGNOSIS — I10 Essential (primary) hypertension: Secondary | ICD-10-CM | POA: Diagnosis not present

## 2021-04-07 DIAGNOSIS — J45909 Unspecified asthma, uncomplicated: Secondary | ICD-10-CM | POA: Insufficient documentation

## 2021-04-07 DIAGNOSIS — Z6841 Body Mass Index (BMI) 40.0 and over, adult: Secondary | ICD-10-CM | POA: Diagnosis not present

## 2021-04-07 DIAGNOSIS — Z743 Need for continuous supervision: Secondary | ICD-10-CM | POA: Diagnosis not present

## 2021-04-07 DIAGNOSIS — R079 Chest pain, unspecified: Secondary | ICD-10-CM | POA: Diagnosis not present

## 2021-04-07 DIAGNOSIS — R0789 Other chest pain: Secondary | ICD-10-CM | POA: Diagnosis not present

## 2021-04-07 DIAGNOSIS — M2550 Pain in unspecified joint: Secondary | ICD-10-CM | POA: Diagnosis present

## 2021-04-07 LAB — BASIC METABOLIC PANEL
Anion gap: 4 — ABNORMAL LOW (ref 5–15)
BUN: 12 mg/dL (ref 6–20)
CO2: 27 mmol/L (ref 22–32)
Calcium: 9.2 mg/dL (ref 8.9–10.3)
Chloride: 108 mmol/L (ref 98–111)
Creatinine, Ser: 0.78 mg/dL (ref 0.44–1.00)
GFR, Estimated: >60 mL/min (ref >60)
Glucose, Bld: 117 mg/dL — ABNORMAL HIGH (ref 70–99)
Potassium: 3.6 mmol/L (ref 3.5–5.1)
Sodium: 139 mmol/L (ref 135–145)

## 2021-04-07 LAB — TROPONIN I (HIGH SENSITIVITY)
Troponin I (High Sensitivity): 3 ng/L (ref <18)
Troponin I (High Sensitivity): 5 ng/L (ref <18)
Troponin I (High Sensitivity): 4 ng/L (ref <18)

## 2021-04-07 LAB — RESP PANEL BY RT-PCR (FLU A&B, COVID) ARPGX2
Influenza A by PCR: NEGATIVE
Influenza B by PCR: NEGATIVE
SARS Coronavirus 2 by RT PCR: NEGATIVE

## 2021-04-07 LAB — CBC
HCT: 37.1 % (ref 36.0–46.0)
Hemoglobin: 11.4 g/dL — ABNORMAL LOW (ref 12.0–15.0)
MCH: 29.5 pg (ref 26.0–34.0)
MCHC: 30.7 g/dL (ref 30.0–36.0)
MCV: 95.9 fL (ref 80.0–100.0)
Platelets: 216 10*3/uL (ref 150–400)
RBC: 3.87 MIL/uL (ref 3.87–5.11)
RDW: 14.8 % (ref 11.5–15.5)
WBC: 4.2 10*3/uL (ref 4.0–10.5)
nRBC: 0 % (ref 0.0–0.2)

## 2021-04-07 LAB — I-STAT BETA HCG BLOOD, ED (MC, WL, AP ONLY): I-stat hCG, quantitative: <5 m[IU]/mL (ref <5)

## 2021-04-07 MED ORDER — ENOXAPARIN SODIUM 40 MG/0.4ML IJ SOSY: 40.0000 mg | PREFILLED_SYRINGE | INTRAMUSCULAR | Status: DC

## 2021-04-07 MED ORDER — CARVEDILOL 3.125 MG PO TABS
6.2500 mg | ORAL_TABLET | Freq: Two times a day (BID) | ORAL | Status: DC
  Administered 2021-04-07 – 2021-04-08 (×2): 6.25 mg via ORAL
  Filled 2021-04-07 (×2): qty 2

## 2021-04-07 MED ORDER — NITROGLYCERIN 0.4 MG SL SUBL: 0.4000 mg | SUBLINGUAL_TABLET | SUBLINGUAL | Status: DC | PRN

## 2021-04-07 MED ORDER — ACETAMINOPHEN 650 MG RE SUPP: 650.0000 mg | Freq: Four times a day (QID) | RECTAL | Status: DC | PRN

## 2021-04-07 MED ORDER — APIXABAN 5 MG PO TABS
5.0000 mg | ORAL_TABLET | Freq: Two times a day (BID) | ORAL | Status: DC
  Administered 2021-04-08 (×2): 5 mg via ORAL
  Filled 2021-04-07 (×2): qty 1

## 2021-04-07 MED ORDER — NITROGLYCERIN 0.4 MG SL SUBL
0.4000 mg | SUBLINGUAL_TABLET | SUBLINGUAL | Status: DC | PRN
  Administered 2021-04-07 (×2): 0.4 mg via SUBLINGUAL
  Filled 2021-04-07 (×2): qty 1

## 2021-04-07 MED ORDER — IOHEXOL 350 MG/ML SOLN
100.0000 mL | Freq: Once | INTRAVENOUS | Status: AC | PRN
  Administered 2021-04-07: 100 mL via INTRAVENOUS

## 2021-04-07 MED ORDER — ACETAMINOPHEN 325 MG PO TABS
650.0000 mg | ORAL_TABLET | Freq: Four times a day (QID) | ORAL | Status: DC | PRN
  Administered 2021-04-08: 650 mg via ORAL
  Filled 2021-04-07: qty 2

## 2021-04-07 MED ORDER — HYDRALAZINE HCL 20 MG/ML IJ SOLN
5.0000 mg | INTRAMUSCULAR | Status: DC | PRN
  Administered 2021-04-07: 5 mg via INTRAVENOUS
  Filled 2021-04-07: qty 1

## 2021-04-07 MED ORDER — ASPIRIN 325 MG PO TABS
325.0000 mg | ORAL_TABLET | Freq: Every day | ORAL | Status: DC
  Administered 2021-04-07 – 2021-04-08 (×2): 325 mg via ORAL
  Filled 2021-04-07 (×2): qty 1

## 2021-04-07 NOTE — ED Notes (Signed)
°  Contacted transpiration for a hospital bed for patient comfort

## 2021-04-07 NOTE — ED Provider Notes (Signed)
[facility] DEPT Provider Note   CSN: [id number] Arrival date & time: 04/07/21  1137     History  Chief Complaint  Patient presents with   Chest Pain    [name] is a 56 y.o. female.  56 year old female presents with shortness of breath and chest discomfort which began today prior to her working out.  Does have a history of PE in the past and has been noncompliant with her Eliquis.  Denies any fever, cough, congestion.  States that her dyspnea is exertional.  Does have remote history of asthma but denies hearing any wheezing.  No prior cardiac history states the pain in her chest has been persistent for about 6 hours and gets worse at times.      Home Medications Prior to Admission medications   Medication Sig Start Date End Date Taking? Authorizing Provider  acetaminophen (TYLENOL) 325 MG tablet Take 2 tablets (650 mg total) by mouth every 6 (six) hours as needed for mild pain (or Fever >/= 101). 01/06/19   Shelly Coss, MD  albuterol (VENTOLIN HFA) 108 (90 Base) MCG/ACT inhaler Inhale 2 puffs into the lungs every 6 (six) hours as needed for wheezing or shortness of breath. 07/12/20   Gildardo Pounds, NP  apixaban (ELIQUIS) 5 MG TABS tablet Take 1 tablet (5 mg total) by mouth 2 (two) times daily. 10/12/20 01/10/21  Gildardo Pounds, NP  carvedilol (COREG) 6.25 MG tablet TAKE 1 TABLET BY MOUTH TWICE DAILY WITH A MEAL 11/29/20   Gildardo Pounds, NP  cephALEXin (KEFLEX) 500 MG capsule Take 2 capsules (1,000 mg total) by mouth 2 (two) times daily. 02/21/21   Lajean Saver, MD  CIMZIA PREFILLED 2 X 200 MG/ML PSKT Inject into the skin. 08/09/20   [provider]  CLENPIQ 10-3.5-12 MG-GM -GM/160ML SOLN Take by mouth once. 01/12/21   [provider]  docusate sodium (COLACE) 100 MG capsule 1-2 cap(s) orally 2 times a day 12/14/10   [provider]  folic acid (FOLVITE) 1 MG tablet Take 1 tablet (1 mg total) by mouth daily.  10/12/20   Gildardo Pounds, NP  methotrexate 50 MG/2ML injection Inject into the skin. 07/27/20   [provider]  [place]. Devices (BARIATRIC ROLLATOR) MISC Please provide patient with insurance approved bariatric rollator walker. I89.0, R53.81, R26.81 11/05/19   Gildardo Pounds, NP  MYRBETRIQ 50 MG TB24 tablet Take 50 mg by mouth daily. 11/24/20   [provider]  predniSONE (DELTASONE) 5 MG tablet See admin instructions.    [provider]      Allergies    Abatacept, Bee venom, Augmentin [amoxicillin-pot clavulanate], Latex, and Nulytely [peg 6283-TDV-VO bicarb-nacl]    Review of Systems   Review of Systems  All other systems reviewed and are negative.  Physical Exam Updated Vital Signs BP (!) 156/89    Pulse 86    Temp 98.2 F (36.8 C) (Oral)    Resp 16    LMP 01/31/2011    SpO2 98%  Physical Exam Vitals and nursing note reviewed.  Constitutional:      General: She is not in acute distress.    Appearance: Normal appearance. She is well-developed. She is not toxic-appearing.  HENT:     Head: Normocephalic and atraumatic.  Eyes:     General: Lids are normal.     Conjunctiva/sclera: Conjunctivae normal.     Pupils: Pupils are equal, round, and reactive to light.  Neck:  Thyroid: No thyroid mass.     Trachea: No tracheal deviation.  Cardiovascular:     Rate and Rhythm: Normal rate and regular rhythm.     Heart sounds: Normal heart sounds. No murmur heard.   No gallop.  Pulmonary:     Effort: Pulmonary effort is normal. No respiratory distress.     Breath sounds: Normal breath sounds. No stridor. No decreased breath sounds, wheezing, rhonchi or rales.  Abdominal:     General: There is no distension.     Palpations: Abdomen is soft.     Tenderness: There is no abdominal tenderness. There is no rebound.  Musculoskeletal:        General: No tenderness. Normal range of motion.     Cervical back: Normal range of motion and neck supple.  Skin:     General: Skin is warm and dry.     Findings: No abrasion or rash.  Neurological:     Mental Status: She is alert and oriented to person, place, and time. Mental status is at baseline.     GCS: GCS eye subscore is 4. GCS verbal subscore is 5. GCS motor subscore is 6.     Cranial Nerves: No cranial nerve deficit.     Sensory: No sensory deficit.     Motor: Motor function is intact.  Psychiatric:        Attention and Perception: Attention normal.        Speech: Speech normal.        Behavior: Behavior normal.    ED Results / Procedures / Treatments   Labs (all labs ordered are listed, but only abnormal results are displayed) Labs Reviewed  BASIC METABOLIC PANEL - Abnormal; Notable for the following components:      Result Value   Glucose, Bld 117 (*)    Anion gap 4 (*)    All other components within normal limits  CBC - Abnormal; Notable for the following components:   Hemoglobin 11.4 (*)    All other components within normal limits  I-STAT BETA HCG BLOOD, ED ([facility], [facility], [facility] ONLY)  TROPONIN I (HIGH SENSITIVITY)  TROPONIN I (HIGH SENSITIVITY)    EKG EKG Interpretation  Date/Time:  Thursday April 07 2021 11:55:51 EST Ventricular Rate:  88 PR Interval:  200 QRS Duration: 121 QT Interval:  389 QTC Calculation: 471 R Axis:   -63 Text Interpretation: Sinus rhythm LAE, consider biatrial enlargement Nonspecific IVCD with LAD Left ventricular hypertrophy Anterior Q waves, possibly due to LVH ST elevation, consider inferior injury Confirmed by Lacretia Leigh ([id number]) on 04/07/2021 4:05:38 PM  Radiology DG Chest 2 View  Result Date: 04/07/2021 CLINICAL DATA:  Right-sided chest pain for several hours. EXAM: CHEST - 2 VIEW COMPARISON:  07/27/2020 FINDINGS: The heart size and mediastinal contours are within normal limits. Both lungs are clear. Stable thoracic dextroscoliosis. IMPRESSION: No active cardiopulmonary disease. Electronically Signed   By: Marlaine Hind M.D.   On: 04/07/2021 12:22    CT Angio Chest PE W and/or Wo Contrast  Result Date: 04/07/2021 CLINICAL DATA:  Pulmonary embolism (PE) suspected, high prob. Chest pain, shortness of breath, and dizziness. EXAM: CT ANGIOGRAPHY CHEST WITH CONTRAST TECHNIQUE: Multidetector CT imaging of the chest was performed using the standard protocol during bolus administration of intravenous contrast. Multiplanar CT image reconstructions and MIPs were obtained to evaluate the vascular anatomy. RADIATION DOSE REDUCTION: This exam was performed according to the departmental dose-optimization program which includes automated exposure control, adjustment of the mA and/or  kV according to patient size and/or use of iterative reconstruction technique. CONTRAST:  141mL OMNIPAQUE IOHEXOL 350 MG/ML SOLN COMPARISON:  Chest CTA 12/31/2018 FINDINGS: Cardiovascular: Pulmonary arterial opacification is adequate, and no emboli are identified although motion artifact limits segmental and subsegmental assessment, particularly in the lower lobes. The thoracic aorta is normal in caliber. A small pericardial effusion is similar in size to the prior study. The heart is borderline enlarged. Mediastinum/Nodes: Decreased bilateral axillary lymphadenopathy with remaining nodes measuring up to 1.1 cm in short axis on the right and 1.3 cm on the left. No enlarged mediastinal or hilar lymph nodes. Unremarkable thyroid and esophagus. Lungs/Pleura: No pleural effusion or pneumothorax. Two 2-3 mm subpleural nodules in the right middle lobe are unchanged and considered benign. There is no consolidation. Upper Abdomen: Hypodensity in the posterior right hepatic lobe, too small to likely a cyst. Mild chronic nodularity of the right adrenal gland. 2.2 cm left adrenal nodule with attenuation of -11 HU. Musculoskeletal: Moderate thoracic dextroscoliosis. Review of the MIP images confirms the above findings. IMPRESSION: 1. No evidence of pulmonary emboli or other acute abnormality in the chest.  2. Unchanged small pericardial effusion. 3. Decreased bilateral axillary lymphadenopathy. 4. 2.2 cm left adrenal mass, consistent with lipid-rich benign adenoma. No follow-up imaging is recommended. JACR 2017 Aug; 14(8):1038-44, JCAT 2016 Mar-Apr; 40(2):194-200, Urol J 2006 Spring; 3(2):71-4. Electronically Signed   By: Logan Bores M.D.   On: 04/07/2021 16:09    Procedures Procedures    Medications Ordered in ED Medications  nitroGLYCERIN (NITROSTAT) SL tablet 0.4 mg (has no administration in time range)  iohexol (OMNIPAQUE) 350 MG/ML injection 100 mL (100 mLs Intravenous Contrast Given 04/07/21 1515)    ED Course/ Medical Decision Making/ A&P                           Medical Decision Making Amount and/or Complexity of Data Reviewed Labs: ordered. Radiology: ordered.  Risk Prescription drug management.   Old records reviewed.  Patient initially presented with chest pain and shortness of breath concerning for PE as patient has history of same and has not been compliant with Eliquis.  Chest x-ray without acute findings.  Subsequent chest CT also normal.  Troponin negative but patient's pain did improve after taking 2 nitro sublingual.  She is currently pain-free at this time.  We will give aspirin at this time.  Patient's heart score is 5.  Patient will require admission for risk ratification        Final Clinical Impression(s) / ED Diagnoses Final diagnoses:  None    Rx / DC Orders ED Discharge Orders     None         Lacretia Leigh, MD 04/07/21 [phone]

## 2021-04-07 NOTE — H&P (Signed)
History and Physical    Patient: [name] VCB:[id number] DOB: [birth year] DOA: 04/07/2021 DOS: the patient was seen and examined on 04/07/2021 PCP: Gildardo Pounds, NP  Patient coming from: Home  Chief Complaint:  Chief Complaint  Patient presents with   Chest Pain    HPI: [name] is a 56 y.o. female with medical history significant of anxiety, asthma, depression, PE in 2018 and subsequent LE DVT currently on eliquis presents to ED with complaints of chest pains. Reports at around 10-11am on the AM of presentation, pt was at gym. Just before starting her work out, pt reported mid, substernal chest pain followed by nausea without vomiting. Pt sat down and later felt "dizzy." EMS called and pt brought to ED for further work up.  In the ED, trop noted to be neg x 2 with EKG being nonischemic. Pt reported much improvement after ntg x2. When seen, pt reports no further chest pain. Still feeling mildly nauseated. Hospitalist consulted for consideration for admission   Review of Systems: As mentioned in the history of present illness. All other systems reviewed and are negative. Past Medical History:  Diagnosis Date   Anemia    Anxiety    Arthritis    Asthma    hx as child - no prob as adult - no inhaler   Blood transfusion 01/22/11   transfusion 2 units at [facility]   Depression 08/2010   psych assessment   Dyspnea    Fibroid    Headache(784.0)    rx for imitrex - last one jan   Keloid    Lymphedema    Pulmonary embolism [facility])    Past Surgical History:  Procedure Laterality Date   ABDOMINAL HYSTERECTOMY     COLONOSCOPY N/A 11/08/2017   Procedure: COLONOSCOPY;  Surgeon: Danie Binder, MD;  Location: [facility]ITE;  Service: Endoscopy;  Laterality: N/A;  2:00pm   FLEXIBLE SIGMOIDOSCOPY N/A 09/17/2017   Procedure: FLEXIBLE SIGMOIDOSCOPY;  Surgeon: Danie Binder, MD;  Location: [facility];  Service: Endoscopy;  Laterality: N/A;   HERNIA REPAIR  [id number]   umbilical    POLYPECTOMY  11/08/2017   Procedure: POLYPECTOMY;  Surgeon: Danie Binder, MD;  Location: [facility];  Service: Endoscopy;;  ascending colon (CSx1), transverse colon (CS x1), splenic flexure (HSx1)   SVD     Spontaneous vaginal delivery; x 1   Social History:  reports that she has never smoked. She has never used smokeless tobacco. She reports that she does not drink alcohol and does not use drugs.  Allergies  Allergen Reactions   Abatacept Anaphylaxis    Other reaction(s): Anaphylaxis-Streptomycin Clickject Other reaction(s): Anaphylaxis-Streptomycin   Bee Venom Anaphylaxis   Augmentin [Amoxicillin-Pot Clavulanate] Hives, Itching and Other (See Comments)    Did PCN reaction causing immediate rash, facial/tongue/throat swelling, SOB or lightheadedness with hypotension: yes Did PCN reaction causing severe rash involving mucus membranes or skin necrosis: no Has patient had a PCN reaction that required hospitalization: in hospital Has patient had a PCN reaction occurring within the last 10 years: no If all of the above answers are "NO", then may proceed with Cephalosporin use.  Pt reports no recollection of any reactions when taking penicillin in past   Latex Hives and Other (See Comments)    Local reaction (welts). Patient denies any wheezing or other reaction with latex exposure   Nulytely [Peg 3350-Kcl-Na Bicarb-Nacl]     NAUSEA AND VOMITING. MAY TOLERATE LOW VOLUME PREP.  Family History  Problem Relation Age of Onset   Heart disease Mother    Hypertension Mother    Hypertension Father    Colon cancer Father 51       Passed away 45 yrs old   Hypertension Sister    Cancer Maternal Grandmother        gastric cancer   Cancer Maternal Grandfather        pancreatic cancer   Colon cancer Paternal Grandmother    Colon cancer Paternal Uncle    Colon polyps Neg Hx     Prior to Admission medications   Medication Sig Start Date End Date Taking? Authorizing Provider   acetaminophen (TYLENOL) 325 MG tablet Take 2 tablets (650 mg total) by mouth every 6 (six) hours as needed for mild pain (or Fever >/= 101). 01/06/19   Shelly Coss, MD  albuterol (VENTOLIN HFA) 108 (90 Base) MCG/ACT inhaler Inhale 2 puffs into the lungs every 6 (six) hours as needed for wheezing or shortness of breath. 07/12/20   Gildardo Pounds, NP  apixaban (ELIQUIS) 5 MG TABS tablet Take 1 tablet (5 mg total) by mouth 2 (two) times daily. 10/12/20 01/10/21  Gildardo Pounds, NP  carvedilol (COREG) 6.25 MG tablet TAKE 1 TABLET BY MOUTH TWICE DAILY WITH A MEAL 11/29/20   Gildardo Pounds, NP  cephALEXin (KEFLEX) 500 MG capsule Take 2 capsules (1,000 mg total) by mouth 2 (two) times daily. 02/21/21   Lajean Saver, MD  CIMZIA PREFILLED 2 X 200 MG/ML PSKT Inject into the skin. 08/09/20   [provider]  CLENPIQ 10-3.5-12 MG-GM -GM/160ML SOLN Take by mouth once. 01/12/21   [provider]  docusate sodium (COLACE) 100 MG capsule 1-2 cap(s) orally 2 times a day 12/14/10   [provider]  folic acid (FOLVITE) 1 MG tablet Take 1 tablet (1 mg total) by mouth daily. 10/12/20   Gildardo Pounds, NP  methotrexate 50 MG/2ML injection Inject into the skin. 07/27/20   [provider]  [place]. Devices (BARIATRIC ROLLATOR) MISC Please provide patient with insurance approved bariatric rollator walker. I89.0, R53.81, R26.81 11/05/19   Gildardo Pounds, NP  MYRBETRIQ 50 MG TB24 tablet Take 50 mg by mouth daily. 11/24/20   [provider]  predniSONE (DELTASONE) 5 MG tablet See admin instructions.    [provider]    Physical Exam: Vitals:   04/07/21 1645 04/07/21 1700 04/07/21 1715 04/07/21 1730  BP:  (!) 151/87  (!) 182/96  Pulse: 72 81 78 75  Resp:  18  18  Temp:      TempSrc:      SpO2: 99% 99% 100% 100%   General exam: Awake, laying in bed, in nad Respiratory system: Normal respiratory effort, no wheezing Cardiovascular system: regular rate, s1,  s2 Gastrointestinal system: Soft, nondistended, positive BS Central nervous system: CN2-12 grossly intact, strength intact Extremities: Perfused, no clubbing Skin: Normal skin turgor, no notable skin lesions seen Psychiatry: Mood normal // no visual hallucinations   Data Reviewed:  Trop neg  Assessment and Plan: * Chest pain -trop neg x 2 at time of presentation -had complained of chest pains improved with ntg -f/u on 2d echo and will repeat ekg in AM -follow serial trop  Polyarthralgia- (present on admission) -cont with analgesia as needed  Body mass index (BMI) 45.0-49.9, adult (HCC) -Most recent wt of 161.9kg -Recommend diet/lifestyle modification -Pt actively going to gym and has reportedly already lost 60lbs. Pt congratulated  History  of pulmonary embolus (PE)- (present on admission) -s/p treatment with eliquis, diagnosed in 2018 with eliquis continued for subsequent DVT diagnosis -chart reviewed. Subsequent CTA studies have been neg for PE -Cont eliquis per home regimen   Advance Care Planning:   Code Status: Prior Full  Consults:   Family Communication: Pt in room  Severity of Illness: The appropriate patient status for this patient is OBSERVATION. Observation status is judged to be reasonable and necessary in order to provide the required intensity of service to ensure the patient's safety. The patient's presenting symptoms, physical exam findings, and initial radiographic and laboratory data in the context of their medical condition is felt to place them at decreased risk for further clinical deterioration. Furthermore, it is anticipated that the patient will be medically stable for discharge from the hospital within 2 midnights of admission.   Author: Marylu Lund, MD 04/07/2021 6:24 PM  For on call review [email or web address].

## 2021-04-07 NOTE — Assessment & Plan Note (Addendum)
-  Most recent wt of 161.9kg -Recommend diet/lifestyle modification -Pt actively going to gym and has reportedly already lost 60lbs. Pt congratulated

## 2021-04-07 NOTE — ED Notes (Signed)
Came to check on pt.  Pt tells me that she is wet and would like to be placed on purewick.  Pt is wet, her depend is full, pants and gown and sweated and saturated as is bed.  Pt advised to call if she feels wet or needs anything so she does not have to become this uncomfortable.  Pt tells me that she asked previous RN for purewick and that she called x2 to say that she was wet.  This was not communicated to me in report and I aplogized to pt who was very understanding and did not seem upset.  Got pt a BSC and helped her up to it while I stripped and cleaned bed.  Set pt up to wash herself with soap and water as she said she could do so independently./  Made up bed with chucks.  Bagged all pt clothes that were saturated.

## 2021-04-07 NOTE — ED Triage Notes (Signed)
Pt presents with c/o chest pain. EMS reports pt was at the gym, started having chest pain and dizziness. Pt has a hx of PE's in the past, sporadically taking her medication for this.

## 2021-04-07 NOTE — Assessment & Plan Note (Addendum)
-  trop has remained serially negative -had complained of chest pains improved with ntg -Ordered and reviewed 2d echo, normal LVEF of 65-70% with no wall motion abnormality -Has remained pain free the remainder of the visit -Given risk, consider outpatient stress

## 2021-04-07 NOTE — ED Provider Triage Note (Signed)
Emergency Medicine Provider Triage Evaluation Note  [name] , a 56 y.o. female  was evaluated in triage.  Pt complains of chest pain. Patient arrives via EMS, was at the gym and started having chest pain and dizziness.  Has a history of PEs and has been sporadically taking medication for this.  Reports she just began when symptoms started.  Reports associated shortness of breath.  Review of Systems  Positive: Chest pain, dizziness, SOB Negative: Syncope, fever, vomiting  Physical Exam  BP (!) 156/89    Pulse 86    Temp 98.2 F (36.8 C) (Oral)    Resp 16    LMP 01/31/2011    SpO2 98%  Gen:   Awake, no distress   Resp:  Normal effort, CTA bilat MSK:   Moves extremities without difficulty  Other:    Medical Decision Making  Medically screening exam initiated at 11:58 AM.  Appropriate orders placed.  MARAI TEEHAN was informed that the remainder of the evaluation will be completed by another provider, this initial triage assessment does not replace that evaluation, and the importance of remaining in the ED until their evaluation is complete.  CP, hx of PEs and not consistently taking anticoags, CTA ordered, as well as labs and EKG   Jacqlyn Larsen, [place] 04/07/21 1208

## 2021-04-07 NOTE — Assessment & Plan Note (Addendum)
-  s/p treatment with eliquis, diagnosed in 2018 with eliquis continued for subsequent DVT diagnosis -chart reviewed. Subsequent CTA studies have been neg for PE -Cont eliquis per home regimen

## 2021-04-07 NOTE — ED Notes (Signed)
Pt moved to a hospital bed for comfort.  Placed bari socks on her and advised to let us know if they are too tight as hers from home are larger.  Pt old soiled socks washed and wrung out and hung on foot of bed so hopefully she will be able to use them tomorrow.  Purewick suction container emptied.  Pt given call bell.

## 2021-04-07 NOTE — Assessment & Plan Note (Signed)
-  cont with analgesia as needed

## 2021-04-08 ENCOUNTER — Observation Stay (HOSPITAL_BASED_OUTPATIENT_CLINIC_OR_DEPARTMENT_OTHER): Payer: Medicare HMO

## 2021-04-08 DIAGNOSIS — M2550 Pain in unspecified joint: Secondary | ICD-10-CM

## 2021-04-08 DIAGNOSIS — R079 Chest pain, unspecified: Secondary | ICD-10-CM | POA: Diagnosis not present

## 2021-04-08 LAB — CBC
HCT: 36.8 % (ref 36.0–46.0)
Hemoglobin: 11.5 g/dL — ABNORMAL LOW (ref 12.0–15.0)
MCH: 29.6 pg (ref 26.0–34.0)
MCHC: 31.3 g/dL (ref 30.0–36.0)
MCV: 94.6 fL (ref 80.0–100.0)
Platelets: 215 10*3/uL (ref 150–400)
RBC: 3.89 MIL/uL (ref 3.87–5.11)
RDW: 14.8 % (ref 11.5–15.5)
WBC: 5.4 10*3/uL (ref 4.0–10.5)
nRBC: 0 % (ref 0.0–0.2)

## 2021-04-08 LAB — URINALYSIS, ROUTINE W REFLEX MICROSCOPIC
Bacteria, UA: NONE SEEN
Bilirubin Urine: NEGATIVE
Glucose, UA: NEGATIVE mg/dL
Ketones, ur: NEGATIVE mg/dL
Leukocytes,Ua: NEGATIVE
Nitrite: NEGATIVE
Protein, ur: NEGATIVE mg/dL
Specific Gravity, Urine: 1.006 (ref 1.005–1.030)
pH: 8 (ref 5.0–8.0)

## 2021-04-08 LAB — COMPREHENSIVE METABOLIC PANEL
ALT: 19 U/L (ref 0–44)
AST: 23 U/L (ref 15–41)
Albumin: 3.5 g/dL (ref 3.5–5.0)
Alkaline Phosphatase: 47 U/L (ref 38–126)
Anion gap: 6 (ref 5–15)
BUN: 9 mg/dL (ref 6–20)
CO2: 27 mmol/L (ref 22–32)
Calcium: 9.1 mg/dL (ref 8.9–10.3)
Chloride: 106 mmol/L (ref 98–111)
Creatinine, Ser: 0.7 mg/dL (ref 0.44–1.00)
GFR, Estimated: >60 mL/min (ref >60)
Glucose, Bld: 99 mg/dL (ref 70–99)
Potassium: 3.6 mmol/L (ref 3.5–5.1)
Sodium: 139 mmol/L (ref 135–145)
Total Bilirubin: 1.2 mg/dL (ref 0.3–1.2)
Total Protein: 7.3 g/dL (ref 6.5–8.1)

## 2021-04-08 LAB — ECHOCARDIOGRAM COMPLETE
AR max vel: 2.55 cm2
AV Area VTI: 2.65 cm2
AV Area mean vel: 2.51 cm2
AV Mean grad: 10 mmHg
AV Peak grad: 17.3 mmHg
Ao pk vel: 2.08 m/s
Area-P 1/2: 4.06 cm2
Calc EF: 75.5 %
S' Lateral: 2.8 cm
Single Plane A2C EF: 77.5 %
Single Plane A4C EF: 72.9 %

## 2021-04-08 LAB — TROPONIN I (HIGH SENSITIVITY): Troponin I (High Sensitivity): 7 ng/L (ref <18)

## 2021-04-08 LAB — OSMOLALITY: Osmolality: 297 mOsm/kg — ABNORMAL HIGH (ref 275–295)

## 2021-04-08 LAB — HIV ANTIBODY (ROUTINE TESTING W REFLEX): HIV Screen 4th Generation wRfx: NONREACTIVE

## 2021-04-08 LAB — OSMOLALITY, URINE: Osmolality, Ur: 315 mOsm/kg (ref 300–900)

## 2021-04-08 NOTE — Discharge Summary (Signed)
Physician Discharge Summary   Patient: [name] MRN: [id number] DOB: [birth year]  Admit date:     04/07/2021  Discharge date: 04/08/21  Discharge Physician: Marylu Lund   PCP: Gildardo Pounds, NP   Recommendations at discharge:    Follow up with PCP in 1-2 weeks May consider referral for stress testing as outpatient  Discharge Diagnoses: Principal Problem:   Chest pain Active Problems:   History of pulmonary embolus (PE)   Body mass index (BMI) 45.0-49.9, adult (HCC)   Polyarthralgia  Resolved Problems:   * No resolved hospital problems. *   Hospital Course: 56 y.o. female with medical history significant of anxiety, asthma, depression, PE in 2018 and subsequent LE DVT currently on eliquis presents to ED with complaints of chest pains. Reports at around 10-11am on the AM of presentation, pt was at gym. Just before starting her work out, pt reported mid, substernal chest pain followed by nausea without vomiting. Pt sat down and later felt "dizzy." EMS called and pt brought to ED for further work up.   In the ED, trop noted to be neg x 2 with EKG being nonischemic. Pt reported much improvement after ntg x2. When seen, pt reports no further chest pain. Still feeling mildly nauseated. Hospitalist consulted for consideration for admission   Assessment and Plan: * Chest pain -trop has remained serially negative -had complained of chest pains improved with ntg -Ordered and reviewed 2d echo, normal LVEF of 65-70% with no wall motion abnormality -Has remained pain free the remainder of the visit -Given risk, consider outpatient stress  Polyarthralgia- (present on admission) -cont with analgesia as needed  Body mass index (BMI) 45.0-49.9, adult ([place]) -Most recent wt of 161.9kg -Recommend diet/lifestyle modification -Pt actively going to gym and has reportedly already lost 60lbs. Pt congratulated  History of pulmonary embolus (PE)- (present on admission) -s/p treatment  with eliquis, diagnosed in 2018 with eliquis continued for subsequent DVT diagnosis -chart reviewed. Subsequent CTA studies have been neg for PE -Cont eliquis per home regimen        Consultants:  Procedures performed:   Disposition: Home Diet recommendation:  Regular diet  DISCHARGE MEDICATION: Allergies as of 04/08/2021       Reactions   Abatacept Anaphylaxis   Other reaction(s): Anaphylaxis-Streptomycin Clickject Other reaction(s): Anaphylaxis-Streptomycin   Bee Venom Anaphylaxis   Augmentin [amoxicillin-pot Clavulanate] Hives, Itching, Other (See Comments)   Did PCN reaction causing immediate rash, facial/tongue/throat swelling, SOB or lightheadedness with hypotension: yes Did PCN reaction causing severe rash involving mucus membranes or skin necrosis: no Has patient had a PCN reaction that required hospitalization: in hospital Has patient had a PCN reaction occurring within the last 10 years: no If all of the above answers are "NO", then may proceed with Cephalosporin use. Pt reports no recollection of any reactions when taking penicillin in past   Latex Hives, Other (See Comments)   Local reaction (welts). Patient denies any wheezing or other reaction with latex exposure   Nulytely [peg [id number] Bicarb-nacl]    NAUSEA AND VOMITING. MAY TOLERATE LOW VOLUME PREP.        Medication List     STOP taking these medications    cephALEXin 500 MG capsule Commonly known as: Keflex       TAKE these medications    acetaminophen 500 MG tablet Commonly known as: TYLENOL Take 1,000 mg by mouth every 6 (six) hours as needed for moderate pain. What changed: Another medication with the same name  was removed. Continue taking this medication, and follow the directions you see here.   albuterol 108 (90 Base) MCG/ACT inhaler Commonly known as: VENTOLIN HFA Inhale 2 puffs into the lungs every 6 (six) hours as needed for wheezing or shortness of breath.   apixaban 5 MG  Tabs tablet Commonly known as: Eliquis Take 1 tablet (5 mg total) by mouth 2 (two) times daily.   Bariatric Rollator Misc Please provide patient with insurance approved bariatric rollator walker. I89.0, R53.81, R26.81   carvedilol 6.25 MG tablet Commonly known as: COREG TAKE 1 TABLET BY MOUTH TWICE DAILY WITH A MEAL   Cimzia Prefilled 2 X 200 MG/ML Pskt Generic drug: certolizumab pegol Inject 200 mg into the skin every 14 (fourteen) days.   Clenpiq 10-3.5-12 MG-GM -GM/160ML Soln Generic drug: Sod Picosulfate-Mag Ox-Cit Acd Take by mouth once.   folic acid 1 MG tablet Commonly known as: FOLVITE Take 1 tablet (1 mg total) by mouth daily.   methotrexate 50 MG/2ML injection Inject 20 mg into the skin once a week. Patient reports 0.8 ml weekly   Myrbetriq 50 MG Tb24 tablet Generic drug: mirabegron ER Take 50 mg by mouth daily.        Follow-up Information     Gildardo Pounds, NP Follow up in 2 week(s).   Specialty: Nurse Practitioner Why: Hospital follow up Contact information: [place] [address] [phone]                 Discharge Exam:  General exam: Awake, laying in bed, in nad Respiratory system: Normal respiratory effort, no wheezing Cardiovascular system: regular rate, s1, s2 Gastrointestinal system: Soft, nondistended, positive BS Central nervous system: CN2-12 grossly intact, strength intact Extremities: Perfused, no clubbing, BLE chronic elephantitis Skin: Normal skin turgor, no notable skin lesions seen Psychiatry: Mood normal // no visual hallucinations    Condition at discharge: good  The results of significant diagnostics from this hospitalization (including imaging, microbiology, ancillary and laboratory) are listed below for reference.   Imaging Studies: DG Chest 2 View  Result Date: 04/07/2021 CLINICAL DATA:  Right-sided chest pain for several hours. EXAM: CHEST - 2 VIEW COMPARISON:  07/27/2020 FINDINGS: The  heart size and mediastinal contours are within normal limits. Both lungs are clear. Stable thoracic dextroscoliosis. IMPRESSION: No active cardiopulmonary disease. Electronically Signed   By: Marlaine Hind M.D.   On: 04/07/2021 12:22   CT Angio Chest PE W and/or Wo Contrast  Result Date: 04/07/2021 CLINICAL DATA:  Pulmonary embolism (PE) suspected, high prob. Chest pain, shortness of breath, and dizziness. EXAM: CT ANGIOGRAPHY CHEST WITH CONTRAST TECHNIQUE: Multidetector CT imaging of the chest was performed using the standard protocol during bolus administration of intravenous contrast. Multiplanar CT image reconstructions and MIPs were obtained to evaluate the vascular anatomy. RADIATION DOSE REDUCTION: This exam was performed according to the departmental dose-optimization program which includes automated exposure control, adjustment of the mA and/or kV according to patient size and/or use of iterative reconstruction technique. CONTRAST:  171mL OMNIPAQUE IOHEXOL 350 MG/ML SOLN COMPARISON:  Chest CTA 12/31/2018 FINDINGS: Cardiovascular: Pulmonary arterial opacification is adequate, and no emboli are identified although motion artifact limits segmental and subsegmental assessment, particularly in the lower lobes. The thoracic aorta is normal in caliber. A small pericardial effusion is similar in size to the prior study. The heart is borderline enlarged. Mediastinum/Nodes: Decreased bilateral axillary lymphadenopathy with remaining nodes measuring up to 1.1 cm in short axis on the right and 1.3 cm  on the left. No enlarged mediastinal or hilar lymph nodes. Unremarkable thyroid and esophagus. Lungs/Pleura: No pleural effusion or pneumothorax. Two 2-3 mm subpleural nodules in the right middle lobe are unchanged and considered benign. There is no consolidation. Upper Abdomen: Hypodensity in the posterior right hepatic lobe, too small to likely a cyst. Mild chronic nodularity of the right adrenal gland. 2.2 cm left  adrenal nodule with attenuation of -11 HU. Musculoskeletal: Moderate thoracic dextroscoliosis. Review of the MIP images confirms the above findings. IMPRESSION: 1. No evidence of pulmonary emboli or other acute abnormality in the chest. 2. Unchanged small pericardial effusion. 3. Decreased bilateral axillary lymphadenopathy. 4. 2.2 cm left adrenal mass, consistent with lipid-rich benign adenoma. No follow-up imaging is recommended. JACR 2017 Aug; 14(8):1038-44, JCAT 2016 Mar-Apr; 40(2):194-200, Urol J 2006 Spring; 3(2):71-4. Electronically Signed   By: Logan Bores M.D.   On: 04/07/2021 16:09   ECHOCARDIOGRAM COMPLETE  Result Date: 04/08/2021    ECHOCARDIOGRAM REPORT   Patient Name:   SHARION GRIEVES Date of Exam: 04/08/2021 Medical Rec #:  [id number]         Height:       72.0 in Accession #:    [id number]        Weight:       357.0 lb Date of Birth:  [birth year]        BSA:          2.725 m Patient Age:    14 years          BP:           157/77 mmHg Patient Gender: F                 HR:           91 bpm. Exam Location:  Inpatient Procedure: 2D Echo, Cardiac Doppler, Color Doppler and Strain Analysis Indications:    R07.9* Chest pain, unspecified  History:        Patient has prior history of Echocardiogram examinations, most                 recent 01/01/2019. Signs/Symptoms:Dyspnea.  Sonographer:    Bernadene Person RDCS Referring Phys: [place]  1. Left ventricular ejection fraction, by estimation, is 65 to 70%. The left ventricle has hyperdynamic function. The left ventricle has no regional wall motion abnormalities. There is mild concentric left ventricular hypertrophy. Left ventricular diastolic parameters are consistent with Grade I diastolic dysfunction (impaired relaxation). The average left ventricular global longitudinal strain is -22.4 %.  2. Right ventricular systolic function is normal. The right ventricular size is mildly enlarged. There is mildly elevated pulmonary artery  systolic pressure. The estimated right ventricular systolic pressure is 12.7 mmHg.  3. Left atrial size was mildly dilated.  4. A small pericardial effusion is present. The pericardial effusion is localized near the right ventricle. There is no evidence of cardiac tamponade.  5. The mitral valve is normal in structure. Mild mitral valve regurgitation. No evidence of mitral stenosis.  6. The aortic valve is normal in structure. Aortic valve regurgitation is not visualized. No aortic stenosis is present.  7. The inferior vena cava is dilated in size with <50% respiratory variability, suggesting right atrial pressure of 15 mmHg. Conclusion(s)/Recommendation(s): Increased flow across all valves is consistent with high cardiac output state (consider anemia, thyrotoxicosis, infection, pregnancy, cirrhosis, AV fistula, etc). FINDINGS  Left Ventricle: Left ventricular ejection fraction, by estimation, is 65 to 70%. The left  ventricle has hyperdynamic function. The left ventricle has no regional wall motion abnormalities. The average left ventricular global longitudinal strain is -22.4  %. The left ventricular internal cavity size was normal in size. There is mild concentric left ventricular hypertrophy. Left ventricular diastolic parameters are consistent with Grade I diastolic dysfunction (impaired relaxation). Indeterminate filling pressures. Right Ventricle: The right ventricular size is mildly enlarged. No increase in right ventricular wall thickness. Right ventricular systolic function is normal. There is mildly elevated pulmonary artery systolic pressure. The tricuspid regurgitant velocity is 2.31 m/s, and with an assumed right atrial pressure of 15 mmHg, the estimated right ventricular systolic pressure is 95.1 mmHg. Left Atrium: Left atrial size was mildly dilated. Right Atrium: Right atrial size was normal in size. Pericardium: A small pericardial effusion is present. The pericardial effusion is localized near the  right ventricle. There is no evidence of cardiac tamponade. Mitral Valve: The mitral valve is normal in structure. Mild mitral valve regurgitation. No evidence of mitral valve stenosis. Tricuspid Valve: The tricuspid valve is normal in structure. Tricuspid valve regurgitation is trivial. No evidence of tricuspid stenosis. Aortic Valve: The aortic valve is normal in structure. Aortic valve regurgitation is not visualized. No aortic stenosis is present. Aortic valve mean gradient measures 10.0 mmHg. Aortic valve peak gradient measures 17.3 mmHg. Aortic valve area, by VTI measures 2.65 cm. Pulmonic Valve: The pulmonic valve was normal in structure. Pulmonic valve regurgitation is not visualized. No evidence of pulmonic stenosis. Aorta: The aortic root is normal in size and structure. Venous: The inferior vena cava is dilated in size with less than 50% respiratory variability, suggesting right atrial pressure of 15 mmHg. IAS/Shunts: No atrial level shunt detected by color flow Doppler.  LEFT VENTRICLE PLAX 2D LVIDd:         4.80 cm      Diastology LVIDs:         2.80 cm      LV e' medial:    6.81 cm/s LV PW:         1.20 cm      LV E/e' medial:  13.9 LV IVS:        1.20 cm      LV e' lateral:   8.84 cm/s LVOT diam:     1.90 cm      LV E/e' lateral: 10.7 LV SV:         104 LV SV Index:   38           2D Longitudinal Strain LVOT Area:     2.84 cm     2D Strain GLS Avg:     -22.4 %  LV Volumes (MOD) LV vol d, MOD A2C: 123.0 ml LV vol d, MOD A4C: 117.0 ml LV vol s, MOD A2C: 27.7 ml LV vol s, MOD A4C: 31.7 ml LV SV MOD A2C:     95.3 ml LV SV MOD A4C:     117.0 ml LV SV MOD BP:      92.2 ml RIGHT VENTRICLE RV S prime:     15.90 cm/s TAPSE (M-mode): 3.1 cm LEFT ATRIUM             Index        RIGHT ATRIUM           Index LA diam:        3.80 cm 1.39 cm/m   RA Area:     18.90 cm LA Vol (A2C):   62.9 ml 23.08  ml/m  RA Volume:   52.30 ml  19.19 ml/m LA Vol (A4C):   58.4 ml 21.43 ml/m LA Biplane Vol: 63.9 ml 23.45 ml/m   AORTIC VALVE AV Area (Vmax):    2.55 cm AV Area (Vmean):   2.51 cm AV Area (VTI):     2.65 cm AV Vmax:           208.00 cm/s AV Vmean:          146.000 cm/s AV VTI:            0.393 m AV Peak Grad:      17.3 mmHg AV Mean Grad:      10.0 mmHg LVOT Vmax:         187.00 cm/s LVOT Vmean:        129.000 cm/s LVOT VTI:          0.367 m LVOT/AV VTI ratio: 0.93  AORTA Ao Root diam: 3.10 cm Ao Asc diam:  3.20 cm MITRAL VALVE                TRICUSPID VALVE MV Area (PHT): 4.06 cm     TR Peak grad:   21.3 mmHg MV Decel Time: 187 msec     TR Vmax:        231.00 cm/s MV E velocity: 94.60 cm/s MV A velocity: 108.00 cm/s  SHUNTS MV E/A ratio:  0.88         Systemic VTI:  0.37 m                             Systemic Diam: 1.90 cm Dani Gobble Croitoru MD Electronically signed by Sanda Klein MD Signature Date/Time: 04/08/2021/1:00:50 PM    Final     Microbiology: Results for orders placed or performed during the hospital encounter of 04/07/21  Resp Panel by RT-PCR (Flu A&B, Covid) Nasopharyngeal Swab     Status: None   Collection Time: 04/07/21  5:45 PM   Specimen: Nasopharyngeal Swab; Nasopharyngeal(NP) swabs in vial transport medium  Result Value Ref Range Status   SARS Coronavirus 2 by RT PCR NEGATIVE NEGATIVE Final    Comment: (NOTE) SARS-CoV-2 target nucleic acids are NOT DETECTED.  The SARS-CoV-2 RNA is generally detectable in upper respiratory specimens during the acute phase of infection. The lowest concentration of SARS-CoV-2 viral copies this assay can detect is 138 copies/mL. A negative result does not preclude SARS-Cov-2 infection and should not be used as the sole basis for treatment or other patient management decisions. A negative result may occur with  improper specimen collection/handling, submission of specimen other than nasopharyngeal swab, presence of viral mutation(s) within the areas targeted by this assay, and inadequate number of viral copies(<138 copies/mL). A negative result must be  combined with clinical observations, patient history, and epidemiological information. The expected result is Negative.  Fact Sheet for Patients:  EntrepreneurPulse.com.au  Fact Sheet for Healthcare Providers:  IncredibleEmployment.be  This test is no t yet approved or cleared by the [place] FDA and  has been authorized for detection and/or diagnosis of SARS-CoV-2 by FDA under an Emergency Use Authorization (EUA). This EUA will remain  in effect (meaning this test can be used) for the duration of the COVID-19 declaration under Section 564(b)(1) of the Act, 21 U.S.C.section 360bbb-3(b)(1), unless the authorization is terminated  or revoked sooner.       Influenza A by PCR NEGATIVE NEGATIVE Final   Influenza B by  PCR NEGATIVE NEGATIVE Final    Comment: (NOTE) The Xpert Xpress SARS-CoV-2/FLU/RSV plus assay is intended as an aid in the diagnosis of influenza from Nasopharyngeal swab specimens and should not be used as a sole basis for treatment. Nasal washings and aspirates are unacceptable for Xpert Xpress SARS-CoV-2/FLU/RSV testing.  Fact Sheet for Patients: EntrepreneurPulse.com.au  Fact Sheet for Healthcare Providers: IncredibleEmployment.be  This test is not yet approved or cleared by the [place] FDA and has been authorized for detection and/or diagnosis of SARS-CoV-2 by FDA under an Emergency Use Authorization (EUA). This EUA will remain in effect (meaning this test can be used) for the duration of the COVID-19 declaration under Section 564(b)(1) of the Act, 21 U.S.C. section 360bbb-3(b)(1), unless the authorization is terminated or revoked.  Performed at [facility], [place] [address], [place], [place] [address]     Labs: CBC: Recent Labs  Lab 04/07/21 1213 04/08/21 0457  WBC 4.2 5.4  HGB 11.4* 11.5*  HCT 37.1 36.8  MCV 95.9 94.6  PLT 216 520   Basic  Metabolic Panel: Recent Labs  Lab 04/07/21 1213 04/08/21 0457  NA 139 139  K 3.6 3.6  CL 108 106  CO2 27 27  GLUCOSE 117* 99  BUN 12 9  CREATININE 0.78 0.70  CALCIUM 9.2 9.1   Liver Function Tests: Recent Labs  Lab 04/08/21 0457  AST 23  ALT 19  ALKPHOS 47  BILITOT 1.2  PROT 7.3  ALBUMIN 3.5   CBG: No results for input(s): GLUCAP in the last 168 hours.  Discharge time spent: less than 30 minutes.  Signed: Marylu Lund, MD [facility] 04/08/2021

## 2021-04-08 NOTE — ED Notes (Signed)
Patient given meal tray.

## 2021-04-08 NOTE — ED Notes (Signed)
Attending at bedside.

## 2021-04-08 NOTE — ED Notes (Signed)
Please note that pt has had a tremendous urine output.  When I got here at 7pm her bed was saturated so I was unable to measure the output and when I cleaned her up and placed her on purewick she made a large amout of urine (1536ml see I&O).  Pt was then changed to hospital bed and there was some urine which ended on chux in addition to urine counted above.  When I went to check on her at 2am I noted that suction container was filled with urine and as a result the additional urine which she had made was in depend and chux.  Gown and covers changed and new chux and depend placed on pt.  Suction container emptied and pt immediately made more urine, as a total I emptied 1455ml at this time (see I&O) and I was not able to count the urine that was in depend and chux. Will notify admitting md or this polyuria

## 2021-04-08 NOTE — ED Notes (Signed)
Pt is voiding large amounts (see I&O) pt states that last time she was in the hospital a similar thing happened and it was "lymphedema draining".  I looked at her legs and the previously tight skin is looser and pt notes that she is able to move her legs much better even able to move them off bed.  Measured left leg at mid calf at this time and found it was 26inches.

## 2021-04-08 NOTE — Progress Notes (Signed)
°  Echocardiogram 2D Echocardiogram has been performed.  Fidel Levy 04/08/2021, 10:13 AM

## 2021-04-08 NOTE — ED Notes (Signed)
Echo at bedside

## 2021-04-13 DIAGNOSIS — M0579 Rheumatoid arthritis with rheumatoid factor of multiple sites without organ or systems involvement: Secondary | ICD-10-CM | POA: Diagnosis not present

## 2021-04-13 DIAGNOSIS — Z79899 Other long term (current) drug therapy: Secondary | ICD-10-CM | POA: Diagnosis not present

## 2021-05-05 ENCOUNTER — Other Ambulatory Visit: Payer: Self-pay

## 2021-05-05 ENCOUNTER — Encounter: Payer: Self-pay | Admitting: Physician Assistant

## 2021-05-05 ENCOUNTER — Ambulatory Visit: Payer: Medicare HMO | Attending: Physician Assistant | Admitting: Physician Assistant

## 2021-05-05 VITALS — BP 143/76 | HR 82 | Resp 16 | Ht 72.0 in | Wt 368.1 lb

## 2021-05-05 DIAGNOSIS — Z09 Encounter for follow-up examination after completed treatment for conditions other than malignant neoplasm: Secondary | ICD-10-CM

## 2021-05-05 DIAGNOSIS — I10 Essential (primary) hypertension: Secondary | ICD-10-CM

## 2021-05-05 DIAGNOSIS — Z91199 Patient's noncompliance with other medical treatment and regimen due to unspecified reason: Secondary | ICD-10-CM | POA: Diagnosis not present

## 2021-05-05 DIAGNOSIS — R079 Chest pain, unspecified: Secondary | ICD-10-CM | POA: Diagnosis not present

## 2021-05-05 DIAGNOSIS — I890 Lymphedema, not elsewhere classified: Secondary | ICD-10-CM

## 2021-05-05 DIAGNOSIS — R7303 Prediabetes: Secondary | ICD-10-CM

## 2021-05-05 MED ORDER — FUROSEMIDE 20 MG PO TABS: 20.0000 mg | ORAL_TABLET | Freq: Every day | ORAL | 1 refills | Status: DC

## 2021-05-05 MED ORDER — NITROGLYCERIN 0.4 MG SL SUBL: 0.4000 mg | SUBLINGUAL_TABLET | SUBLINGUAL | 1 refills | Status: DC | PRN

## 2021-05-05 NOTE — Progress Notes (Signed)
Patient ID: [name], female   DOB: 07/19/1965, 56 y.o.   MRN: [id number] ? ? ? ?[name], is a 56 y.o. female ? ?DXA:[id number] ? ?MCN:[id number] ? ?DOB - September 21, 1965 ? ?Chief Complaint  ?Patient presents with  ? Hospitalization Follow-up  ?    ? ?Subjective:  ? ?[name] is a 55 y.o. female here today for a follow up visit ED visit that turned into an overnight stay 04/07/2021 for CP.  She feels like her legs are doing some better but would like something to have on hand when they swell more(has elephantitis and lymphedema).  Would like new referral for wound care center for her legs.  Occasionally has CP(like she did at ED) and NG helped.  She has not seen a cardiologist.  She is NOT compliant on eleiquis tho she says she has been taking it more regularly since hospital stay(about 75% of the time).  No current CP or SOB.   ? ?From ED A/P: ?Old records reviewed.  Patient initially presented with chest pain and shortness of breath concerning for PE as patient has history of same and has not been compliant with Eliquis.  Chest x-ray without acute findings.  Subsequent chest CT also normal.  Troponin negative but patient's pain did improve after taking 2 nitro sublingual.  She is currently pain-free at this time.  We will give aspirin at this time.  Patient's heart score is 5. ?No problems updated. ? ?ALLERGIES: ?Allergies  ?Allergen Reactions  ? Abatacept Anaphylaxis  ?  Other reaction(s): Anaphylaxis-Streptomycin ?Clickject ?Other reaction(s): Anaphylaxis-Streptomycin  ? Bee Venom Anaphylaxis  ? Augmentin [Amoxicillin-Pot Clavulanate] Hives, Itching and Other (See Comments)  ?  Did PCN reaction causing immediate rash, facial/tongue/throat swelling, SOB or lightheadedness with hypotension: yes ?Did PCN reaction causing severe rash involving mucus membranes or skin necrosis: no ?Has patient had a PCN reaction that required hospitalization: in hospital ?Has patient had a PCN reaction occurring within  the last 10 years: no ?If all of the above answers are "NO", then may proceed with Cephalosporin use. ? ?Pt reports no recollection of any reactions when taking penicillin in past  ? Latex Hives and Other (See Comments)  ?  Local reaction (welts). Patient denies any wheezing or other reaction with latex exposure  ? Nulytely [Peg 3350-Kcl-Na Bicarb-Nacl]   ?  NAUSEA AND VOMITING. MAY TOLERATE LOW VOLUME PREP.  ? ? ?PAST MEDICAL HISTORY: ?Past Medical History:  ?Diagnosis Date  ? Anemia   ? Anxiety   ? Arthritis   ? Asthma   ? hx as child - no prob as adult - no inhaler  ? Blood transfusion 01/22/11  ? transfusion 2 units at [facility]  ? Depression 08/2010  ? psych assessment  ? Dyspnea   ? Fibroid   ? Headache(784.0)   ? rx for imitrex - last one jan  ? Keloid   ? Lymphedema   ? Pulmonary embolism ([place])   ? ? ?MEDICATIONS AT HOME: ?Prior to Admission medications   ?Medication Sig Start Date End Date Taking? Authorizing Provider  ?acetaminophen (TYLENOL) 500 MG tablet Take 1,000 mg by mouth every 6 (six) hours as needed for moderate pain.   Yes [provider]  ?albuterol (VENTOLIN HFA) 108 (90 Base) MCG/ACT inhaler Inhale 2 puffs into the lungs every 6 (six) hours as needed for wheezing or shortness of breath. 07/12/20  Yes Gildardo Pounds, NP  ?carvedilol (COREG) 6.25 MG tablet TAKE 1 TABLET BY MOUTH TWICE DAILY  WITH A MEAL ?Patient taking differently: Take 6.25 mg by mouth 2 (two) times daily with a meal. 11/29/20  Yes Gildardo Pounds, NP  ?[facility] PREFILLED 2 X 200 MG/ML PSKT Inject 200 mg into the skin every 14 (fourteen) days. 08/09/20  Yes [provider]  ?CLENPIQ 10-3.5-12 MG-GM -GM/160ML SOLN Take by mouth once. 01/12/21  Yes [provider]  ?folic acid (FOLVITE) 1 MG tablet Take 1 tablet (1 mg total) by mouth daily. 10/12/20  Yes Gildardo Pounds, NP  ?furosemide (LASIX) 20 MG tablet Take 1 tablet (20 mg total) by mouth daily. X 7 days then prn swelling 05/05/21  Yes Penelopi Mikrut, Dionne Bucy, PA-C   ?methotrexate 50 MG/2ML injection Inject 20 mg into the skin once a week. Patient reports 0.8 ml weekly 07/27/20  Yes [provider]  ?[place]. Devices (BARIATRIC ROLLATOR) MISC Please provide patient with insurance approved bariatric rollator walker. I89.0, R53.81, R26.81 11/05/19  Yes Gildardo Pounds, NP  ?MYRBETRIQ 50 MG TB24 tablet Take 50 mg by mouth daily. 11/24/20  Yes [provider]  ?nitroGLYCERIN (NITROSTAT) 0.4 MG SL tablet Place 1 tablet (0.4 mg total) under the tongue every 5 (five) minutes as needed for chest pain. 05/05/21  Yes Argentina Donovan, PA-C  ?apixaban (ELIQUIS) 5 MG TABS tablet Take 1 tablet (5 mg total) by mouth 2 (two) times daily. 10/12/20 04/07/21  Gildardo Pounds, NP  ? ? ?ROS: ?Neg HEENT ?Neg GI ?Neg GU ?Neg MS ?Neg psych ?Neg neuro ? ?Objective:  ? ?Vitals:  ? 05/05/21 1423  ?BP: (!) 143/76  ?Pulse: 82  ?Resp: 16  ?SpO2: 98%  ?Weight: (!) 368 lb 2 oz (167 kg)  ?Height: 6' (1.829 m)  ? ?Exam ?General appearance : Awake, alert, not in any distress. Speech Clear. Not toxic looking;  morbidly obese.  In wheel chair ?HEENT: Atraumatic and Normocephalic ?Chest: Good air entry bilaterally, CTAB.  No rales/rhonchi/wheezing ?CVS: S1 S2 regular, no murmurs.  ?Extremities: B/L Lower Ext shows extreme edema and no open ulcerations ?Neurology: Awake alert, and oriented X 3, CN II-XII intact, Non focal ?Skin: No Rash ? ?Data Review ?Lab Results  ?Component Value Date  ? HGBA1C 6.0 (H) 10/12/2020  ? HGBA1C 6.4 (H) 07/12/2020  ? HGBA1C 5.8 (H) 01/16/2020  ? ? ?Assessment & Plan  ? ?1. Prediabetes ?I have had a lengthy discussion and provided education about insulin resistance and the intake of too much sugar/refined carbohydrates.  I have advised the patient to work at a goal of eliminating sugary drinks, candy, desserts, sweets, refined sugars, processed foods, and white carbohydrates.  The patient expresses understanding.  ? ?- Hemoglobin A1c ? ?2. Lymphedema ?- AMB referral to wound  care center-she desperately needs help with LE circulation and wound/open skin prevention ?- furosemide (LASIX) 20 MG tablet; Take 1 tablet (20 mg total) by mouth daily. X 7 days then prn swelling  Dispense: 30 tablet; Refill: 1 ? ?3. Primary hypertension ?- Ambulatory referral to Cardiology ? ?4. Chest pain, unspecified type ?Neg MI w/up at ED and no CP currently.  I gave her a few NG-if she has to take one of these, I have advised her to call 911.  Patient verbalizes understanding ?- Ambulatory referral to Cardiology ?- nitroGLYCERIN (NITROSTAT) 0.4 MG SL tablet; Place 1 tablet (0.4 mg total) under the tongue every 5 (five) minutes as needed for chest pain.  Dispense: 10 tablet; Refill: 1 ? ?5. Elephantiasis ?- AMB referral to wound care center ? ?  6. Noncompliance ?Compliance imperative-discussed this at length.   ? ?7. Hospital discharge follow-up ? ? ?Call 911 if any problems.  Patient verbalizes understanding ?Patient have been counseled extensively about nutrition and exercise. Other issues discussed during this visit include: low cholesterol diet, weight control and daily exercise, foot care, annual eye examinations at Ophthalmology, importance of adherence with medications and regular follow-up. We also discussed long term complications of uncontrolled diabetes and hypertension.  ? ?Return in about 3 months (around 08/05/2021) for PCP for chronic conditions. ? ?The patient was given clear instructions to go to ER or return to medical center if symptoms don't improve, worsen or new problems develop. The patient verbalized understanding. The patient was told to call to get lab results if they haven't heard anything in the next week.  ? ? ? ? ?Freeman Caldron, PA-C ?[place] ?[place], [place] ?[phone]   ?05/05/2021, 3:05 PM  ?

## 2021-05-06 LAB — HEMOGLOBIN A1C
Est. average glucose Bld gHb Est-mCnc: 111 mg/dL
Hgb A1c MFr Bld: 5.5 % (ref 4.8–5.6)

## 2021-05-06 NOTE — Progress Notes (Signed)
Cardiology Office Note:    Date:  05/09/2021   ID:  [name], DOB 1965/03/05, MRN [id number]  PCP:  Gildardo Pounds, NP   [facility] Providers Cardiologist:  Lenna Sciara, MD Referring MD: Argentina Donovan, PA-C   Chief Complaint/Reason for Referral: Chest pain  ASSESSMENT:    Chest pain, unspecified type  BMI 45.0-49.9, adult [facility])  History of pulmonary embolus (PE)  Lymphedema associated with obesity  PLAN:    In order of problems listed above:  Will start Imdur '30mg'$  QPM, Toprol XL 25 QPM and discontinue carvedilol.  She has anterior Q waves but her echocardiogram demonstrates no anterior wall motion abnormality.  We will obtain a coronary CTA to evaluate further.  If the patient has mild obstructive coronary artery disease, they will require a statin (with goal LDL < 70) and aspirin, if they have high-grade disease we will need to consider optimal medical therapy and if symptoms are refractory to medical therapy, then a cardiac catheterization with possible PCI will be pursued to alleviate symptoms.   If they have high risk disease we will proceed directly to cardiac catheterization.  Follow up in 6 months.  If this is negative given responsiveness of nitroglycerin she may need GI evaluation for esophageal spasm.  Encourage diet and exercise.  Will refer to pharmacy for recommendations regarding obesity management with pharmacotherapy. Continue anticoagulation.  Will see wound care and will discuss referral to lymphedema clinic.Marland Kitchen             Dispo:  Return in about 6 months (around 11/09/2021).     Medication Adjustments/Labs and Tests Ordered: Current medicines are reviewed at length with the patient today.  Concerns regarding medicines are outlined above.   Tests Ordered: No orders of the defined types were placed in this encounter.   Medication Changes: No orders of the defined types were placed in this encounter.   History of Present Illness:     FOCUSED PROBLEM LIST:   1.  Pulmonary embolism in 2018 with subsequent lower extremity DVT on indefinite Eliquis 2.  Anxiety 3.  Asthma 4.  Depression 5.  BMI of 45-49   The patient is a 56 y.o. female with the indicated medical history here for chest pain.  The patient had presented to the emergency department last month with chest pain.  She was at the gym starting to workout when she developed midsternal chest pain followed by nausea.  Serial troponins were negative.  EKG demonstrated anterior Q waves with no acute ST changes with possible LVH unchanged versus previous.  She tells me that she occasionally gets chest pain when she exerts herself.  She describes this as a tightness in her chest that radiates to her back.  Does not seem to come on at rest.  She denies any significant shortness of breath.  She has had no lightheadedness or black getting out spells.  She has had no severe bleeding episodes.  She was working out with her trainer who is her cousin up until 3 weeks ago when she developed this chest pain episode at the gym.  She has held off on working out since then until this gets evaluated.        Previous Medical History: Past Medical History:  Diagnosis Date   Anemia    Anxiety    Arthritis    Asthma    hx as child - no prob as adult - no inhaler   Blood transfusion 01/22/11  transfusion 2 units at [facility]   Depression 08/2010   psych assessment   Dyspnea    Fibroid    Headache(784.0)    rx for imitrex - last one jan   Keloid    Lymphedema    Pulmonary embolism ([place])      Current Medications: Current Meds  Medication Sig   acetaminophen (TYLENOL) 500 MG tablet Take 1,000 mg by mouth every 6 (six) hours as needed for moderate pain.   albuterol (VENTOLIN HFA) 108 (90 Base) MCG/ACT inhaler Inhale 2 puffs into the lungs every 6 (six) hours as needed for wheezing or shortness of breath.   apixaban (ELIQUIS) 5 MG TABS tablet Take 1 tablet (5 mg total) by mouth 2  (two) times daily.   carvedilol (COREG) 6.25 MG tablet TAKE 1 TABLET BY MOUTH TWICE DAILY WITH A MEAL   CIMZIA PREFILLED 2 X 200 MG/ML PSKT Inject 200 mg into the skin every 14 (fourteen) days.   CLENPIQ 10-3.5-12 MG-GM -GM/160ML SOLN Take by mouth once.   folic acid (FOLVITE) 1 MG tablet Take 1 tablet by mouth once daily   furosemide (LASIX) 20 MG tablet Take 1 tablet (20 mg total) by mouth daily. X 7 days then prn swelling   methotrexate 50 MG/2ML injection Inject 20 mg into the skin once a week. Patient reports 0.8 ml weekly   Misc. Devices (BARIATRIC ROLLATOR) MISC Please provide patient with insurance approved bariatric rollator walker. I89.0, R53.81, R26.81   MYRBETRIQ 50 MG TB24 tablet Take 50 mg by mouth daily.   nitroGLYCERIN (NITROSTAT) 0.4 MG SL tablet Place 1 tablet (0.4 mg total) under the tongue every 5 (five) minutes as needed for chest pain.     Allergies:    Abatacept, Bee venom, Augmentin [amoxicillin-pot clavulanate], Latex, and Nulytely [peg 3350-kcl-na bicarb-nacl]   Social History:   Social History   Tobacco Use   Smoking status: Never   Smokeless tobacco: Never  Vaping Use   Vaping Use: Never used  Substance Use Topics   Alcohol use: No   Drug use: No     Family Hx: Family History  Problem Relation Age of Onset   Heart disease Mother    Hypertension Mother    Hypertension Father    Colon cancer Father 58       Passed away 92 yrs old   Hypertension Sister    Cancer Maternal Grandmother        gastric cancer   Cancer Maternal Grandfather        pancreatic cancer   Colon cancer Paternal Grandmother    Colon cancer Paternal Uncle    Colon polyps Neg Hx      Review of Systems:   Please see the history of present illness.    All other systems reviewed and are negative.     EKGs/Labs/Other Test Reviewed:    EKG: Sinus rhythm with anterior Q waves and possible LVH; EKG today demonstrates sinus rhythm with possible LVH and no acute ST  changes.  Prior CV studies: TTE 2/23  1. Left ventricular ejection fraction, by estimation, is 65 to 70%. The  left ventricle has hyperdynamic function. The left ventricle has no  regional wall motion abnormalities. There is mild concentric left  ventricular hypertrophy. Left ventricular  diastolic parameters are consistent with Grade I diastolic dysfunction  (impaired relaxation). The average left ventricular global longitudinal  strain is -22.4 %.   2. Right ventricular systolic function is normal. The right ventricular  size  is mildly enlarged. There is mildly elevated pulmonary artery  systolic pressure. The estimated right ventricular systolic pressure is  24.5 mmHg.   3. Left atrial size was mildly dilated.   4. A small pericardial effusion is present. The pericardial effusion is  localized near the right ventricle. There is no evidence of cardiac  tamponade.   5. The mitral valve is normal in structure. Mild mitral valve  regurgitation. No evidence of mitral stenosis.   6. The aortic valve is normal in structure. Aortic valve regurgitation is  not visualized. No aortic stenosis is present.   7. The inferior vena cava is dilated in size with <50% respiratory  variability, suggesting right atrial pressure of 15 mmHg.   Imaging studies that I have independently reviewed today: Echocardiogram  Recent Labs: 04/08/2021: ALT 19; BUN 9; Creatinine, Ser 0.70; Hemoglobin 11.5; Platelets 215; Potassium 3.6; Sodium 139   Recent Lipid Panel Lab Results  Component Value Date/Time   CHOL 185 07/12/2020 04:28 PM   TRIG 100 07/12/2020 04:28 PM   HDL 50 07/12/2020 04:28 PM   LDLCALC 117 (H) 07/12/2020 04:28 PM    Risk Assessment/Calculations:          Physical Exam:    VS:  BP (!) 152/78    Pulse 94    Ht 6' (1.829 m)    Wt (!) 391 lb (177.4 kg)    LMP 01/31/2011    BMI 53.03 kg/m    Wt Readings from Last 3 Encounters:  05/09/21 (!) 391 lb (177.4 kg)  05/05/21 (!) 368 lb 2 oz  (167 kg)  02/21/21 (!) 357 lb (161.9 kg)    GENERAL:  No apparent distress, AOx3 HEENT:  No carotid bruits, +2 carotid impulses, no scleral icterus CAR: RRR  no murmurs, gallops, rubs, or thrills RES:  Clear to auscultation bilaterally ABD:  Soft, nontender, nondistended, positive bowel sounds x 4 VASC:  +2 radial pulses, +2 carotid pulses, palpable pedal pulses NEURO:  CN 2-12 grossly intact; motor and sensory grossly intact PSYCH:  No active depression or anxiety EXT:  Lymphedema without ecchymosis or cyanosis  Signed, Early Osmond, MD  05/09/2021 10:45 AM    [address], [place], [place]  [address] Phone: [phone]; Fax: [phone]   Note:  This document was prepared using Dragon voice recognition software and may include unintentional dictation errors.

## 2021-05-07 ENCOUNTER — Other Ambulatory Visit: Payer: Self-pay | Admitting: Nurse Practitioner

## 2021-05-07 DIAGNOSIS — R000 Tachycardia, unspecified: Secondary | ICD-10-CM

## 2021-05-07 DIAGNOSIS — D649 Anemia, unspecified: Secondary | ICD-10-CM

## 2021-05-09 ENCOUNTER — Other Ambulatory Visit: Payer: Self-pay

## 2021-05-09 ENCOUNTER — Ambulatory Visit: Payer: Medicare HMO | Admitting: Internal Medicine

## 2021-05-09 ENCOUNTER — Encounter: Payer: Self-pay | Admitting: Internal Medicine

## 2021-05-09 VITALS — BP 152/78 | HR 94 | Ht 72.0 in | Wt 391.0 lb

## 2021-05-09 DIAGNOSIS — R079 Chest pain, unspecified: Secondary | ICD-10-CM | POA: Diagnosis not present

## 2021-05-09 DIAGNOSIS — I890 Lymphedema, not elsewhere classified: Secondary | ICD-10-CM | POA: Diagnosis not present

## 2021-05-09 DIAGNOSIS — Z86711 Personal history of pulmonary embolism: Secondary | ICD-10-CM | POA: Diagnosis not present

## 2021-05-09 DIAGNOSIS — E669 Obesity, unspecified: Secondary | ICD-10-CM | POA: Diagnosis not present

## 2021-05-09 DIAGNOSIS — Z6841 Body Mass Index (BMI) 40.0 and over, adult: Secondary | ICD-10-CM | POA: Diagnosis not present

## 2021-05-09 LAB — BASIC METABOLIC PANEL
BUN/Creatinine Ratio: 11 (ref 9–23)
BUN: 10 mg/dL (ref 6–24)
CO2: 26 mmol/L (ref 20–29)
Calcium: 9.5 mg/dL (ref 8.7–10.2)
Chloride: 106 mmol/L (ref 96–106)
Creatinine, Ser: 0.89 mg/dL (ref 0.57–1.00)
Glucose: 113 mg/dL — ABNORMAL HIGH (ref 70–99)
Potassium: 4.1 mmol/L (ref 3.5–5.2)
Sodium: 141 mmol/L (ref 134–144)
eGFR: 77 mL/min/{1.73_m2} (ref >59)

## 2021-05-09 MED ORDER — ISOSORBIDE MONONITRATE ER 30 MG PO TB24: 30.0000 mg | ORAL_TABLET | Freq: Every day | ORAL | 3 refills | Status: DC

## 2021-05-09 MED ORDER — METOPROLOL TARTRATE 100 MG PO TABS: ORAL_TABLET | ORAL | 0 refills | Status: DC

## 2021-05-09 MED ORDER — METOPROLOL SUCCINATE ER 25 MG PO TB24: 25.0000 mg | ORAL_TABLET | Freq: Every day | ORAL | 3 refills | Status: DC

## 2021-05-09 NOTE — Patient Instructions (Addendum)
Medication Instructions:  ?STOP Carvedilol (Coreg) ?START Toprol XL (metoprolol succinate) at bedtime ?START Imdur (isosorbide) at bedtime ?*If you need a refill on your cardiac medications before your next appointment, please call your pharmacy* ? ? ?Lab Work: ?BMP today ?If you have labs (blood work) drawn today and your tests are completely normal, you will receive your results only by: ?MyChart Message (if you have MyChart) OR ?A paper copy in the mail ?If you have any lab test that is abnormal or we need to change your treatment, we will call you to review the results. ? ? ?Testing/Procedures: ?Coronary CTA ?Your physician has requested that you have cardiac CT. Cardiac computed tomography (CT) is a painless test that uses an x-ray machine to take clear, detailed pictures of your heart. For further information please visit HugeFiesta.tn. Please follow instruction sheet as given. ? ? ?Follow-Up: ?At [facility], you and your health needs are our priority.  As part of our continuing mission to provide you with exceptional heart care, we have created designated Provider Care Teams.  These Care Teams include your primary Cardiologist (physician) and Advanced Practice Providers (APPs -  Physician Assistants and Nurse Practitioners) who all work together to provide you with the care you need, when you need it. ? ? ?Your next appointment:   ?6 month(s) ? ?The format for your next appointment:   ?In Person ? ?Provider:   ?Lenna Sciara, MD ? ? ?Other Instructions ? ? ?Your cardiac CT will be scheduled at:  ? ?[facility] ?[address] ?[place], [place] [address] ?[phone] ? ?please arrive at the [facility] and Children's Entrance (Entrance C2) of [facility] 30 minutes prior to test start time. ?You can use the FREE valet parking offered at entrance C (encouraged to control the heart rate for the test)  ?Proceed to the [facility] Radiology Department (first floor) to check-in and test  prep. ? ?All radiology patients and guests should use entrance C2 at [facility], accessed from [facility], even though the hospital's physical address listed is [address] ? ? ? ? ?Please follow these instructions carefully (unless otherwise directed): ? ?On the Night Before the Test: ?Be sure to Drink plenty of water. ?Do not consume any caffeinated/decaffeinated beverages or chocolate 12 hours prior to your test. ?Do not take any antihistamines 12 hours prior to your test. ? ?On the Day of the Test: ?Drink plenty of water until 1 hour prior to the test. ?Do not eat any food 4 hours prior to the test. ?You may take your regular medications prior to the test.  ?Take metoprolol '100mg'$  (Lopressor) two hours prior to test. ?HOLD Furosemide the morning of the test. ?FEMALES- please wear underwire-free bra if available, avoid dresses & tight clothing ?     ?After the Test: ?Drink plenty of water. ?After receiving IV contrast, you may experience a mild flushed feeling. This is normal. ?On occasion, you may experience a mild rash up to 24 hours after the test. This is not dangerous. If this occurs, you can take Benadryl 25 mg and increase your fluid intake. ?If you experience trouble breathing, this can be serious. If it is severe call 911 IMMEDIATELY. If it is mild, please call our office. ?If you take any of these medications: Glipizide/Metformin, Avandament, Glucavance, please do not take 48 hours after completing test unless otherwise instructed. ? ?We will call to schedule your test 2-4 weeks out understanding that some insurance  companies will need an authorization prior to the service being performed.  ? ?For non-scheduling related questions, please contact the cardiac imaging nurse navigator should you have any questions/concerns: ?Marchia Bond, Cardiac Imaging Nurse Navigator ?Gordy Clement, Cardiac Imaging Nurse Navigator ?[facility] ?Direct Office  Dial: [phone]  ? ?For scheduling needs, including cancellations and rescheduling, please call [place], [phone]. ?  ?  ?

## 2021-05-09 NOTE — Telephone Encounter (Signed)
Requested Prescriptions  ?Pending Prescriptions Disp Refills  ?? folic acid (FOLVITE) 1 MG tablet [Pharmacy Med Name: Folic Acid 1 MG Oral Tablet] 90 tablet 1  ?  Sig: Take 1 tablet by mouth once daily  ?  ? Endocrinology:  Vitamins Passed - 05/07/2021  2:51 AM  ?  ?  Passed - Valid encounter within last 12 months  ?  Recent Outpatient Visits   ?      ? 4 days ago Prediabetes  ? [place], [place], [place]  ? 3 months ago Primary hypertension  ? [place], [place] W, NP  ? 6 months ago Prediabetes  ? [place] [facility], [place] W, NP  ? 10 months ago Prediabetes  ? [place], Vernia Buff, NP  ? 1 year ago Prediabetes  ? [place] Gildardo Pounds, NP  ?  ?  ?Future Appointments   ?        ? Today Early Osmond, MD [place], [facility]  ? In 2 weeks Valinda Party, DO Wound Care and [place], [facility]  ? In 2 months Gildardo Pounds, NP [place]  ?  ? ?  ?  ?  ?? carvedilol (COREG) 6.25 MG tablet [Pharmacy Med Name: Carvedilol 6.25 MG Oral Tablet] 180 tablet 0  ?  Sig: TAKE 1 TABLET BY MOUTH TWICE DAILY WITH A MEAL  ?  ? Cardiovascular: Beta Blockers 3 Failed - 05/07/2021  2:51 AM  ?  ?  Failed - Last BP in normal range  ?  BP Readings from Last 1 Encounters:  ?05/05/21 (!) 143/76  ?   ?  ?  Passed - Cr in normal range and within 360 days  ?  Creatinine  ?Date Value Ref Range Status  ?05/13/2018 0.89 0.44 - 1.00 mg/dL Final  ? ?Creatinine, Ser  ?Date Value Ref Range Status  ?04/08/2021 0.70 0.44 - 1.00 mg/dL Final  ?   ?  ?  Passed - AST in normal range and within 360 days  ?  AST  ?Date Value Ref Range Status  ?04/08/2021 23 15 - 41 U/L Final  ?05/13/2018 11 (L) 15 - 41 U/L Final  ?   ?  ?  Passed - ALT in normal range and within 360 days  ?  ALT  ?Date Value Ref Range  Status  ?04/08/2021 19 0 - 44 U/L Final  ?05/13/2018 8 0 - 44 U/L Final  ?   ?  ?  Passed - Last Heart Rate in normal range  ?  Pulse Readings from Last 1 Encounters:  ?05/05/21 82  ?   ?  ?  Passed - Valid encounter within last 6 months  ?  Recent Outpatient Visits   ?      ? 4 days ago Prediabetes  ? [place], [place], [place]  ? 3 months ago Primary hypertension  ? [place], [place] W, NP  ? 6 months ago Prediabetes  ? [place], [place] W, NP  ? 10 months ago Prediabetes  ? [place], Vernia Buff, NP  ? 1 year ago Prediabetes  ? [place] Gildardo Pounds, NP  ?  ?  ?  Future Appointments   ?        ? Today Early Osmond, MD [place], [facility]  ? In 2 weeks Valinda Party, DO Wound Care and [place], [facility]  ? In 2 months Gildardo Pounds, NP [place]  ?  ? ?  ?  ?  ? ?

## 2021-05-18 ENCOUNTER — Telehealth (HOSPITAL_COMMUNITY): Payer: Self-pay | Admitting: *Deleted

## 2021-05-18 NOTE — Telephone Encounter (Signed)
Reaching out to patient to offer assistance regarding upcoming cardiac imaging study; pt verbalizes understanding of appt date/time, parking situation and where to check in, pre-test NPO status and medications ordered; name and call back number provided for further questions should they arise ? ?Gordy Clement RN Navigator Cardiac Imaging ?[facility] ?[phone] office ?[phone] cell ? ?Patient to take '100mg'$  metoprolol tartrate two hours prior to her cardiac CT scan.  She is aware to arrive at 1:30pm for her 2pm.  Pt states she is arriving via [employer] bus. ?

## 2021-05-19 ENCOUNTER — Other Ambulatory Visit: Payer: Self-pay

## 2021-05-19 ENCOUNTER — Ambulatory Visit (HOSPITAL_COMMUNITY)
Admission: RE | Admit: 2021-05-19 | Discharge: 2021-05-19 | Disposition: A | Discharge status: Home / Self Care | Payer: Medicare HMO | Source: Ambulatory Visit | Attending: Internal Medicine | Admitting: Internal Medicine

## 2021-05-19 DIAGNOSIS — Z86711 Personal history of pulmonary embolism: Secondary | ICD-10-CM

## 2021-05-19 DIAGNOSIS — I3139 Other pericardial effusion (noninflammatory): Secondary | ICD-10-CM | POA: Diagnosis not present

## 2021-05-19 DIAGNOSIS — I890 Lymphedema, not elsewhere classified: Secondary | ICD-10-CM | POA: Insufficient documentation

## 2021-05-19 DIAGNOSIS — R079 Chest pain, unspecified: Secondary | ICD-10-CM | POA: Diagnosis not present

## 2021-05-19 DIAGNOSIS — E669 Obesity, unspecified: Secondary | ICD-10-CM

## 2021-05-19 DIAGNOSIS — R918 Other nonspecific abnormal finding of lung field: Secondary | ICD-10-CM | POA: Insufficient documentation

## 2021-05-19 DIAGNOSIS — Z6841 Body Mass Index (BMI) 40.0 and over, adult: Secondary | ICD-10-CM | POA: Diagnosis present

## 2021-05-19 MED ORDER — METOPROLOL TARTRATE 5 MG/5ML IV SOLN
10.0000 mg | INTRAVENOUS | Status: DC | PRN
Start: 2021-05-19 — End: 2021-05-19

## 2021-05-19 MED ORDER — DILTIAZEM HCL 25 MG/5ML IV SOLN: 10.0000 mg | INTRAVENOUS | Status: DC | PRN

## 2021-05-19 MED ORDER — METOPROLOL TARTRATE 5 MG/5ML IV SOLN: 5.0000 mg | INTRAVENOUS | Status: DC | PRN

## 2021-05-19 MED ORDER — DILTIAZEM HCL 25 MG/5ML IV SOLN: 5.0000 mg | INTRAVENOUS | Status: DC | PRN

## 2021-05-19 MED ORDER — NITROGLYCERIN 0.4 MG SL SUBL
SUBLINGUAL_TABLET | SUBLINGUAL | Status: AC
  Filled 2021-05-19: qty 2

## 2021-05-19 MED ORDER — DILTIAZEM HCL 25 MG/5ML IV SOLN
INTRAVENOUS | Status: AC
  Administered 2021-05-19: 10 mg via INTRAVENOUS
  Filled 2021-05-19: qty 5

## 2021-05-19 MED ORDER — NITROGLYCERIN 0.4 MG SL SUBL
0.8000 mg | SUBLINGUAL_TABLET | Freq: Once | SUBLINGUAL | Status: AC
Start: 2021-05-19 — End: 2021-05-19
  Administered 2021-05-19: 0.8 mg via SUBLINGUAL

## 2021-05-19 MED ORDER — IOHEXOL 350 MG/ML SOLN
95.0000 mL | Freq: Once | INTRAVENOUS | Status: AC | PRN
  Administered 2021-05-19: 95 mL via INTRAVENOUS

## 2021-05-19 MED ORDER — METOPROLOL TARTRATE 5 MG/5ML IV SOLN
INTRAVENOUS | Status: AC
  Administered 2021-05-19: 10 mg via INTRAVENOUS
  Filled 2021-05-19: qty 10

## 2021-05-25 NOTE — Progress Notes (Addendum)
[name], [name] ([id number]) ?Visit Report for 05/26/2021 ?Allergy List Details ?Patient Name: Date of Service: ?[name], [name] 05/26/2021 1:15 PM ?Medical Record Number: [id number] ?Patient Account Number: [id number] ?Date of Birth/Sex: Treating RN: ?[birth year] (56 y.o. Female) [name] ?Primary Care Shawne Eskelson: [name] Other Clinician: ?Referring Landis Dowdy: ?Treating Alma Muegge/Extender: Kalman Shan ?Freeman Caldron ?Weeks in Treatment: 0 ?Allergies ?Active Allergies ?abatacept ?Reaction: Anaphylaxis ?Severity: Severe ?bee venom protein (honey bee) ?Reaction: Anaphylaxis ?Severity: Severe ?latex ?Augmentin ?Reaction: Hives, Itching ?Nulytely ?Reaction: N/V ?Allergy Notes ?Electronic Signature(s) ?Signed: 05/26/2021 4:12:07 PM By: [name] ?Previous Signature: 05/25/2021 9:13:26 AM Version By: [name] ?Entered By: [name] on 05/26/2021 13:18:04 ?-------------------------------------------------------------------------------- ?Arrival Information Details ?Patient Name: Date of Service: ?[name], [name] 05/26/2021 1:15 PM ?Medical Record Number: [id number] ?Patient Account Number: [id number] ?Date of Birth/Sex: Treating RN: ?04-23-1965 (56 y.o. Female) [name] ?Primary Care Jasemine Nawaz: [name] Other Clinician: ?Referring Tomara Youngberg: ?Treating Vasiliki Smaldone/Extender: Kalman Shan ?Freeman Caldron ?Weeks in Treatment: 0 ?Visit Information ?Patient Arrived: [name] ?Arrival Time: 13:10 ?Transfer Assistance: None ?Patient Identification Verified: Yes ?Secondary Verification Process Completed: Yes ?Patient Requires Transmission-Based Precautions: No ?Patient Has Alerts: Yes ?Patient Alerts: Patient on Blood Thinner ?Electronic Signature(s) ?Signed: 05/26/2021 4:12:07 PM By: [name] ?Entered By: [name] on 05/26/2021 13:16:24 ?-------------------------------------------------------------------------------- ?Clinic Level of Care Assessment Details ?Patient Name: Date  of Service: ?[name], [name] 05/26/2021 1:15 PM ?Medical Record Number: [id number] ?Patient Account Number: [id number] ?Date of Birth/Sex: Treating RN: ?09-30-1965 (56 y.o. Female) [name] ?Primary Care Harris Kistler: [name] Other Clinician: ?Referring Adien Kimmel: ?Treating Arius Harnois/Extender: Kalman Shan ?Freeman Caldron ?Weeks in Treatment: 0 ?Clinic Level of Care Assessment Items ?TOOL 2 Quantity Score ?X- 1 0 ?Use when only an EandM is performed on the INITIAL visit ?ASSESSMENTS - Nursing Assessment / Reassessment ?X- 1 20 ?General Physical Exam (combine w/ comprehensive assessment (listed just below) when performed on new pt. evals) ?X- 1 25 ?Comprehensive Assessment (HX, ROS, Risk Assessments, Wounds Hx, etc.) ?ASSESSMENTS - Wound and Skin A ssessment / Reassessment ?X - Simple Wound Assessment / Reassessment - one wound 1 5 ?'[]'$  - 0 ?Complex Wound Assessment / Reassessment - multiple wounds ?'[]'$  - 0 ?Dermatologic / Skin Assessment (not related to wound area) ?ASSESSMENTS - Ostomy and/or Continence Assessment and Care ?'[]'$  - 0 ?Incontinence Assessment and Management ?'[]'$  - 0 ?Ostomy Care Assessment and Management (repouching, etc.) ?PROCESS - Coordination of Care ?'[]'$  - 0 ?Simple Patient / Family Education for ongoing care ?X- 1 20 ?Complex (extensive) Patient / Family Education for ongoing care ?X- 1 10 ?Staff obtains Consents, Records, T Results / Process Orders ?est ?'[]'$  - 0 ?Staff telephones HHA, Nursing Homes / Clarify orders / etc ?'[]'$  - 0 ?Routine Transfer to another Facility (non-emergent condition) ?'[]'$  - 0 ?Routine Hospital Admission (non-emergent condition) ?'[]'$  - 0 ?New Admissions / Biomedical engineer / Ordering NPWT Apligraf, etc. ?, ?'[]'$  - 0 ?Emergency Hospital Admission (emergent condition) ?'[]'$  - 0 ?Simple Discharge Coordination ?'[]'$  - 0 ?Complex (extensive) Discharge Coordination ?PROCESS - Special Needs ?'[]'$  - 0 ?Pediatric / Minor Patient Management ?'[]'$  - 0 ?Isolation Patient  Management ?'[]'$  - 0 ?Hearing / Language / Visual special needs ?'[]'$  - 0 ?Assessment of Community assistance (transportation, D/C planning, etc.) ?'[]'$  - 0 ?Additional assistance / Altered mentation ?'[]'$  - 0 ?Support Surface(s) Assessment (bed, cushion, seat, etc.) ?INTERVENTIONS - Wound Cleansing / Measurement ?X- 1 5 ?Wound Imaging (photographs - any number of wounds) ?'[]'$  - 0 ?Wound Tracing (instead of photographs) ?X- 1 5 ?Simple Wound Measurement -  one wound ?'[]'$  - 0 ?Complex Wound Measurement - multiple wounds ?X- 1 5 ?Simple Wound Cleansing - one wound ?'[]'$  - 0 ?Complex Wound Cleansing - multiple wounds ?INTERVENTIONS - Wound Dressings ?'[]'$  - 0 ?Small Wound Dressing one or multiple wounds ?'[]'$  - 0 ?Medium Wound Dressing one or multiple wounds ?X- 1 20 ?Large Wound Dressing one or multiple wounds ?'[]'$  - 0 ?Application of Medications - injection ?INTERVENTIONS - Miscellaneous ?'[]'$  - 0 ?External ear exam ?'[]'$  - 0 ?Specimen Collection (cultures, biopsies, blood, body fluids, etc.) ?'[]'$  - 0 ?Specimen(s) / Culture(s) sent or taken to Lab for analysis ?'[]'$  - 0 ?Patient Transfer (multiple staff / Civil Service fast streamer / Similar devices) ?'[]'$  - 0 ?Simple Staple / Suture removal (25 or less) ?'[]'$  - 0 ?Complex Staple / Suture removal (26 or more) ?'[]'$  - 0 ?Hypo / Hyperglycemic Management (close monitor of Blood Glucose) ?'[]'$  - 0 ?Ankle / Brachial Index (ABI) - do not check if billed separately ?Has the patient been seen at the hospital within the last three years: Yes ?Total Score: 115 ?Level Of Care: New/Established - Level 3 ?Electronic Signature(s) ?Signed: 05/26/2021 4:12:07 PM By: [name] ?Entered By: [name] on 05/26/2021 14:08:50 ?-------------------------------------------------------------------------------- ?Encounter Discharge Information Details ?Patient Name: Date of Service: ?[name], [name] 05/26/2021 1:15 PM ?Medical Record Number: [id number] ?Patient Account Number: [id number] ?Date of Birth/Sex: Treating  RN: ?02-09-1966 (56 y.o. Female) [name] ?Primary Care Asyah Candler: [name] Other Clinician: ?Referring Cordell Guercio: ?Treating Paidyn Mcferran/Extender: Kalman Shan ?Freeman Caldron ?Weeks in Treatment: 0 ?Encounter Discharge Information Items ?Discharge Condition: Stable ?Ambulatory Status: [name] ?Discharge Destination: Home ?Transportation: Private Auto ?Schedule Follow-up Appointment: Yes ?Clinical Summary of Care: Provided on 05/26/2021 ?Form Type Recipient ?Paper Patient Patient ?Electronic Signature(s) ?Signed: 05/26/2021 4:12:07 PM By: [name] ?Entered By: [name] on 05/26/2021 14:38:13 ?-------------------------------------------------------------------------------- ?Lower Extremity Assessment Details ?Patient Name: ?Date of Service: ?[name], [name] 05/26/2021 1:15 PM ?Medical Record Number: [id number] ?Patient Account Number: [id number] ?Date of Birth/Sex: ?Treating RN: ?04/28/1965 (56 y.o. Female) [name] ?Primary Care Sabir Charters: [name] ?Other Clinician: ?Referring Sudie Bandel: ?Treating Haze Antillon/Extender: Kalman Shan ?Freeman Caldron ?Weeks in Treatment: 0 ?Edema Assessment ?Assessed: [Left: Yes] [Right: No] ?E[Left: dema] [Right: :] ?Calf ?Left: Right: ?Point of Measurement: 33 cm From Medial Instep 78.2 cm ?Ankle ?Left: Right: ?Point of Measurement: 12 cm From Medial Instep 61 cm ?Knee To Floor ?Left: Right: ?From Medial Instep 41 cm ?Vascular Assessment ?Pulses: ?Dorsalis Pedis ?Palpable: [Left:No] ?Doppler Audible: [Left:Inaudible] ?Posterior Tibial ?Doppler Audible: [Left:Inaudible] ?Notes ?Unable to perform ABI, patient's leg too large ?Electronic Signature(s) ?Signed: 05/26/2021 4:12:07 PM By: [name] ?Entered By: [name] on 05/26/2021 13:35:07 ?-------------------------------------------------------------------------------- ?Multi Wound Chart Details ?Patient Name: ?Date of Service: ?[name], [name] 05/26/2021 1:15 PM ?Medical Record Number:  [id number] ?Patient Account Number: [id number] ?Date of Birth/Sex: ?Treating RN: ?[birth year] (56 y.o. Female) [name] ?Primary Care Ranisha Allaire: [name] ?Other Clinician: ?Referring Domonic Hiscox: ?Treating Ananth Fiallos/Extende

## 2021-05-26 ENCOUNTER — Encounter (HOSPITAL_BASED_OUTPATIENT_CLINIC_OR_DEPARTMENT_OTHER): Payer: Medicare HMO | Attending: Internal Medicine | Admitting: Internal Medicine

## 2021-05-26 DIAGNOSIS — Z86711 Personal history of pulmonary embolism: Secondary | ICD-10-CM | POA: Insufficient documentation

## 2021-05-26 DIAGNOSIS — L97822 Non-pressure chronic ulcer of other part of left lower leg with fat layer exposed: Secondary | ICD-10-CM | POA: Insufficient documentation

## 2021-05-26 DIAGNOSIS — M1990 Unspecified osteoarthritis, unspecified site: Secondary | ICD-10-CM | POA: Diagnosis not present

## 2021-05-26 DIAGNOSIS — Z86718 Personal history of other venous thrombosis and embolism: Secondary | ICD-10-CM | POA: Diagnosis not present

## 2021-05-26 DIAGNOSIS — I890 Lymphedema, not elsewhere classified: Secondary | ICD-10-CM | POA: Insufficient documentation

## 2021-05-26 NOTE — Progress Notes (Signed)
[name], [name] ([id number]) ?Visit Report for 05/26/2021 ?Abuse Risk Screen Details ?Patient Name: Date of Service: ?[name], [name] 05/26/2021 1:15 PM ?Medical Record Number: [id number] ?Patient Account Number: [id number] ?Date of Birth/Sex: Treating RN: ?10/06/1965 (56 y.o. Female) [name] ?Primary Care Deveon Kisiel: [name] Other Clinician: ?Referring Yittel Emrich: ?Treating Andreas Sobolewski/Extender: Kalman Shan ?Freeman Caldron ?Weeks in Treatment: 0 ?Abuse Risk Screen Items ?Answer ?ABUSE RISK SCREEN: ?Has anyone close to you tried to hurt or harm you recentlyo No ?Do you feel uncomfortable with anyone in your familyo No ?Has anyone forced you do things that you didnt want to doo No ?Electronic Signature(s) ?Signed: 05/26/2021 4:12:07 PM By: [name] ?Entered By: [name] on 05/26/2021 13:25:42 ?-------------------------------------------------------------------------------- ?Activities of Daily Living Details ?Patient Name: Date of Service: ?[name], [name] 05/26/2021 1:15 PM ?Medical Record Number: [id number] ?Patient Account Number: [id number] ?Date of Birth/Sex: Treating RN: ?Mar 30, 1965 (56 y.o. Female) [name] ?Primary Care Esbeidy Mclaine: [name] Other Clinician: ?Referring Maevyn Riordan: ?Treating Marlee Trentman/Extender: Kalman Shan ?Freeman Caldron ?Weeks in Treatment: 0 ?Activities of Daily Living Items ?Answer ?Activities of Daily Living (Please select one for each item) ?Drive Automobile Need Assistance ?T Medications ?ake Completely Able ?Use T elephone Completely Able ?Care for Appearance Completely Able ?Use T oilet Completely Able ?Bath / Shower Need Assistance ?Dress Self Completely Able ?Feed Self Completely Able ?Walk Need Assistance ?Get In / Out Bed Need Assistance ?Housework Need Assistance ?Prepare Meals Completely Able ?Handle Money Completely Able ?Shop for Self Need Assistance ?Electronic Signature(s) ?Signed: 05/26/2021 4:12:07 PM By: [name] ?Entered By:  [name] on 05/26/2021 13:26:20 ?-------------------------------------------------------------------------------- ?Education Screening Details ?Patient Name: ?Date of Service: ?[name], [name] 05/26/2021 1:15 PM ?Medical Record Number: [id number] ?Patient Account Number: [id number] ?Date of Birth/Sex: ?Treating RN: ?[birth year] (56 y.o. Female) [name] ?Primary Care Vaiden Adames: [name] ?Other Clinician: ?Referring Traci Gafford: ?Treating Kharee Lesesne/Extender: Kalman Shan ?Freeman Caldron ?Weeks in Treatment: 0 ?Primary Learner Assessed: Patient ?Learning Preferences/Education Level/Primary Language ?Learning Preference: Explanation, Demonstration, Printed Material ?Highest Education Level: College or Above ?Preferred Language: English ?Cognitive Barrier ?Language Barrier: No ?Translator Needed: No ?Memory Deficit: No ?Emotional Barrier: No ?Cultural/Religious Beliefs Affecting Medical Care: No ?Physical Barrier ?Impaired Vision: Yes Glasses ?Impaired Hearing: No ?Decreased Hand dexterity: No ?Knowledge/Comprehension ?Knowledge Level: High ?Comprehension Level: High ?Ability to understand written instructions: High ?Ability to understand verbal instructions: High ?Motivation ?Anxiety Level: Calm ?Cooperation: Cooperative ?Education Importance: Acknowledges Need ?Interest in Health Problems: Asks Questions ?Perception: Coherent ?Willingness to Engage in Self-Management High ?Activities: ?Readiness to Engage in Self-Management High ?Activities: ?Electronic Signature(s) ?Signed: 05/26/2021 4:12:07 PM By: [name] ?Entered By: [name] on 05/26/2021 13:26:55 ?-------------------------------------------------------------------------------- ?Fall Risk Assessment Details ?Patient Name: ?Date of Service: ?[name], [name] 05/26/2021 1:15 PM ?Medical Record Number: [id number] ?Patient Account Number: [id number] ?Date of Birth/Sex: ?Treating RN: ?[birth year] (56 y.o. Female) [name] ?Primary  Care Corina Stacy: [name] ?Other Clinician: ?Referring Halea Lieb: ?Treating Rudolph Daoust/Extender: Kalman Shan ?Freeman Caldron ?Weeks in Treatment: 0 ?Fall Risk Assessment Items ?Have you had 2 or more falls in the last 12 monthso 0 No ?Have you had any fall that resulted in injury in the last 12 monthso 0 No ?FALLS RISK SCREEN ?History of falling - immediate or within 3 months 0 No ?Secondary diagnosis (Do you have 2 or more medical diagnoseso) 15 Yes ?Ambulatory aid ?None/bed rest/wheelchair/nurse 0 No ?Crutches/cane/walker 15 Yes ?Furniture 0 No ?Intravenous therapy Access/Saline/Heparin Lock 0 No ?Gait/Transferring ?Normal/ bed rest/ wheelchair 0 No ?Weak (short steps with or without shuffle, stooped but able to lift  head while walking, may seek 10 Yes ?support from furniture) ?Impaired (short steps with shuffle, may have difficulty arising from chair, head down, impaired 0 No ?balance) ?Mental Status ?Oriented to own ability 0 Yes ?Electronic Signature(s) ?Signed: 05/26/2021 4:12:07 PM By: [name] ?Entered By: [name] on 05/26/2021 13:27:23 ?-------------------------------------------------------------------------------- ?Foot Assessment Details ?Patient Name: ?Date of Service: ?[name], [name] 05/26/2021 1:15 PM ?Medical Record Number: [id number] ?Patient Account Number: [id number] ?Date of Birth/Sex: ?Treating RN: ?June 23, 1965 (56 y.o. Female) [name] ?Primary Care Rafay Dahan: [name] ?Other Clinician: ?Referring Darin Arndt: ?Treating Kaden Daughdrill/Extender: Kalman Shan ?Freeman Caldron ?Weeks in Treatment: 0 ?Foot Assessment Items ?Site Locations ?+ = Sensation present, - = Sensation absent, C = Callus, U = Ulcer ?R = Redness, W = Warmth, M = Maceration, PU = Pre-ulcerative lesion ?F = Fissure, S = Swelling, D = Dryness ?Assessment ?Right: Left: ?Other Deformity: No No ?Prior Foot Ulcer: No No ?Prior Amputation: No No ?Charcot Joint: No No ?Ambulatory Status: Ambulatory With  Help ?Assistance Device: Walker ?Gait: Steady ?Electronic Signature(s) ?Signed: 05/26/2021 4:12:07 PM By: [name] ?Entered By: [name] on 05/26/2021 13:30:23 ?-------------------------------------------------------------------------------- ?Nutrition Risk Screening Details ?Patient Name: ?Date of Service: ?[name], [name] 05/26/2021 1:15 PM ?Medical Record Number: [id number] ?Patient Account Number: [id number] ?Date of Birth/Sex: ?Treating RN: ?Jul 28, 1965 (56 y.o. Female) [name] ?Primary Care Kathline Banbury: [name] ?Other Clinician: ?Referring Nitesh Pitstick: ?Treating Jovin Fester/Extender: Kalman Shan ?Freeman Caldron ?Weeks in Treatment: 0 ?Height (in): 72 ?Weight (lbs): 359 ?Body Mass Index (BMI): 48.7 ?Nutrition Risk Screening Items ?Score Screening ?NUTRITION RISK SCREEN: ?I have an illness or condition that made me change the kind and/or amount of food I eat 0 No ?I eat fewer than two meals per day 0 No ?I eat few fruits and vegetables, or milk products 0 No ?I have three or more drinks of beer, liquor or wine almost every day 0 No ?I have tooth or mouth problems that make it hard for me to eat 0 No ?I don't always have enough money to buy the food I need 0 No ?I eat alone most of the time 0 No ?I take three or more different prescribed or over-the-counter drugs a day 1 Yes ?Without wanting to, I have lost or gained 10 pounds in the last six months 0 No ?I am not always physically able to shop, cook and/or feed myself 0 No ?Nutrition Protocols ?Good Risk Protocol 0 No interventions needed ?Moderate Risk Protocol ?High Risk Proctocol ?Risk Level: Good Risk ?Score: 1 ?Electronic Signature(s) ?Signed: 05/26/2021 4:12:07 PM By: [name] ?Entered By: [name] on 05/26/2021 13:27:36 ?

## 2021-05-26 NOTE — Progress Notes (Signed)
BOBBIJO, HOLST ([id number]) ?Visit Report for 05/26/2021 ?Chief Complaint Document Details ?Patient Name: Date of Service: ?[name], [name] 05/26/2021 1:15 PM ?Medical Record Number: [id number] ?Patient Account Number: [id number] ?Date of Birth/Sex: Treating RN: ?19-May-1965 (56 y.o. Female) [name] ?Primary Care Provider: [name] Other Clinician: ?Referring Provider: ?Treating Provider/Extender: Kalman Shan ?Freeman Caldron ?Weeks in Treatment: 0 ?Information Obtained from: Patient ?Chief Complaint ?05/26/2021; Left lower extremity wound ?Electronic Signature(s) ?Signed: 05/26/2021 2:45:54 PM By: Kalman Shan DO ?Entered By: Kalman Shan on 05/26/2021 14:34:36 ?-------------------------------------------------------------------------------- ?HPI Details ?Patient Name: Date of Service: ?[name], [name] 05/26/2021 1:15 PM ?Medical Record Number: [id number] ?Patient Account Number: [id number] ?Date of Birth/Sex: Treating RN: ?17-Jun-1965 (56 y.o. Female) [name] ?Primary Care Provider: [name] Other Clinician: ?Referring Provider: ?Treating Provider/Extender: Kalman Shan ?Freeman Caldron ?Weeks in Treatment: 0 ?History of Present Illness ?HPI Description: Admission 05/26/2021 ?Ms. [name] is a 56 year old female with a past medical history of lymphedema That presents to the clinic for a 3-monthhistory of nonhealing ulcer to ?the posterior left leg. She has not been using any dressing to the wound bed. She has never experienced wounds before. She states that she sleeps in a ?recliner with her feet down. She has lymphedema pumps however has not been using them due to the wound and the increased swelling to her legs. She ?currently denies signs of infection. ?Electronic Signature(s) ?Signed: 05/26/2021 2:45:54 PM By: HKalman ShanDO ?Entered By: HKalman Shanon 05/26/2021  14:40:10 ?-------------------------------------------------------------------------------- ?Physical Exam Details ?Patient Name: Date of Service: ?[name], GUERRIER3/30/2023 1:15 PM ?Medical Record Number: [id number]?Patient Account Number: [id number]?Date of Birth/Sex: Treating RN: ?[birth year](56y.o. Female) BLorrin [name]?Primary Care Provider: FGeryl RankinsOther Clinician: ?Referring Provider: ?Treating Provider/Extender: HKalman Shan?MFreeman Caldron?Weeks in Treatment: 0 ?Constitutional ?respirations regular, non-labored and within target range for patient..Marland Kitchen?Psychiatric ?pleasant and cooperative. ?Notes ?Left lower extremity: Open weeping area to the posterior aspect. She has stage IV lymphedema. No surrounding signs of infection. ?Electronic Signature(s) ?Signed: 05/26/2021 2:45:54 PM By: HKalman ShanDO ?Entered By: HKalman Shanon 05/26/2021 14:41:16 ?-------------------------------------------------------------------------------- ?Physician Orders Details ?Patient Name: Date of Service: ?[name], GENSEL3/30/2023 1:15 PM ?Medical Record Number: [id number]?Patient Account Number: [id number]?Date of Birth/Sex: Treating RN: ?1December 01, 1967(56y.o. Female) BLorrin [name]?Primary Care Provider: FGeryl RankinsOther Clinician: ?Referring Provider: ?Treating Provider/Extender: HKalman Shan?MFreeman Caldron?Weeks in Treatment: 0 ?Verbal / Phone Orders: No ?Diagnosis Coding ?ICD-10 Coding ?Code Description ?LV37.106Non-pressure chronic ulcer of other part of left lower leg with fat layer exposed ?I89.0 Lymphedema, not elsewhere classified ?Follow-up Appointments ?ppointment in 1 week. - with Dr. HHeber [facility]) ?Return A ?Bathing/ Shower/ Hygiene ?May shower with protection but do not get wound dressing(s) wet. - Can get cast protector bag at [facility] or [facility]?Edema Control - Lymphedema / SCD / Other ?Lymphedema Pumps. Use Lymphedema pumps on leg(s) 2-3 times a day for  45-60 minutes. If wearing any wraps or hose, do not remove ?them. Continue exercising as instructed. - Use as directed ?Elevate legs to the level of the heart or above for 30 minutes daily and/or when sitting, a frequency of: - throughout the day ?Avoid standing for long periods of time. ?Moisturize legs daily. ?Wound Treatment ?Wound #1 - Lower Leg Wound Laterality: Left, Posterior ?Cleanser: Soap and Water 1 x Per Week/30 Days ?Discharge Instructions: May shower and wash wound with dial antibacterial soap and water prior to dressing change. ?Cleanser: Wound Cleanser 1 x  Per Week/30 Days ?Discharge Instructions: Cleanse the wound with wound cleanser prior to applying a clean dressing using gauze sponges, not tissue or cotton balls. ?Peri-Wound Care: Triamcinolone 15 (g) 1 x Per Week/30 Days ?Discharge Instructions: Use triamcinolone 15 (g) as directed ?Peri-Wound Care: Sween Lotion (Moisturizing lotion) 1 x Per Week/30 Days ?Discharge Instructions: Apply moisturizing lotion as directed ?Topical: Gentamicin 1 x Per Week/30 Days ?Discharge Instructions: As directed by physician ?Prim Dressing: KerraCel Ag Gelling Fiber Dressing, 4x5 in (silver alginate) 1 x Per Week/30 Days ?ary ?Discharge Instructions: Apply silver alginate to wound bed as instructed ?Secondary Dressing: ABD Pad, 5x9 1 x Per Week/30 Days ?Discharge Instructions: Apply over primary dressing as directed. ?Compression Wrap: Kerlix Roll 4.5x3.1 (in/yd) 1 x Per Week/30 Days ?Discharge Instructions: Apply Kerlix and Coban compression as directed. ?Compression Wrap: Coban Self-Adherent Wrap 4x5 (in/yd) 1 x Per Week/30 Days ?Discharge Instructions: Apply over Kerlix as directed. ?Electronic Signature(s) ?Signed: 05/26/2021 2:45:54 PM By: Kalman Shan DO ?Entered By: Kalman Shan on 05/26/2021 14:41:31 ?-------------------------------------------------------------------------------- ?Problem List Details ?Patient Name: ?Date of Service: ?[name], [name] 05/26/2021 1:15 PM ?Medical Record Number: [id number] ?Patient Account Number: [id number] ?Date of Birth/Sex: ?Treating RN: ?1966-01-11 (56 y.o. Female) [name] ?Primary Care Provider: [name] ?Other Clinician: ?Referring Provider: ?Treating Provider/Extender: Kalman Shan ?Freeman Caldron ?Weeks in Treatment: 0 ?Active Problems ?ICD-10 ?Encounter ?Code Description Active Date MDM ?Diagnosis ?W10.272 Non-pressure chronic ulcer of other part of left lower leg with fat layer exposed3/30/2023 No Yes ?I89.0 Lymphedema, not elsewhere classified 05/26/2021 No Yes ?Inactive Problems ?Resolved Problems ?Electronic Signature(s) ?Signed: 05/26/2021 2:45:54 PM By: Kalman Shan DO ?Entered By: Kalman Shan on 05/26/2021 14:33:56 ?-------------------------------------------------------------------------------- ?Progress Note Details ?Patient Name: ?Date of Service: ?[name], [name] 05/26/2021 1:15 PM ?Medical Record Number: [id number] ?Patient Account Number: [id number] ?Date of Birth/Sex: ?Treating RN: ?11-19-1965 (56 y.o. Female) [name] ?Primary Care Provider: [name] ?Other Clinician: ?Referring Provider: ?Treating Provider/Extender: Kalman Shan ?Freeman Caldron ?Weeks in Treatment: 0 ?Subjective ?Chief Complaint ?Information obtained from Patient ?05/26/2021; Left lower extremity wound ?History of Present Illness (HPI) ?Admission 05/26/2021 ?Ms. Demeshia Sherburne is a 56 year old female with a past medical history of lymphedema That presents to the clinic for a 21-monthhistory of nonhealing ulcer to ?the posterior left leg. She has not been using any dressing to the wound bed. She has never experienced wounds before. She states that she sleeps in a ?recliner with her feet down. She has lymphedema pumps however has not been using them due to the wound and the increased swelling to her legs. She ?currently denies signs of infection. ?Patient History ?Information obtained from  Patient, Chart. ?Allergies ?abatacept (Severity: Severe, Reaction: Anaphylaxis), bee venom protein (honey bee) (Severity: Severe, Reaction: Anaphylaxis), latex, Augmentin (Reaction: ?Hives, Itching), Nulytely (Reaction: N/V) ?Family History ?Cancer - Father, Diabetes - Maternal Grandparents, Heart

## 2021-05-31 ENCOUNTER — Encounter (HOSPITAL_BASED_OUTPATIENT_CLINIC_OR_DEPARTMENT_OTHER): Payer: Medicare HMO | Attending: Internal Medicine | Admitting: Internal Medicine

## 2021-05-31 DIAGNOSIS — L97822 Non-pressure chronic ulcer of other part of left lower leg with fat layer exposed: Secondary | ICD-10-CM | POA: Diagnosis not present

## 2021-05-31 DIAGNOSIS — I890 Lymphedema, not elsewhere classified: Secondary | ICD-10-CM | POA: Diagnosis not present

## 2021-05-31 DIAGNOSIS — L03116 Cellulitis of left lower limb: Secondary | ICD-10-CM | POA: Insufficient documentation

## 2021-05-31 DIAGNOSIS — B952 Enterococcus as the cause of diseases classified elsewhere: Secondary | ICD-10-CM | POA: Diagnosis not present

## 2021-05-31 DIAGNOSIS — L080 Pyoderma: Secondary | ICD-10-CM | POA: Diagnosis not present

## 2021-05-31 NOTE — Progress Notes (Signed)
JAKHIA, BUXTON ([id number]) ?Visit Report for 05/31/2021 ?Chief Complaint Document Details ?Patient Name: Date of Service: ?[name], Lauren E. 05/31/2021 2:00 PM ?Medical Record Number: [id number] ?Patient Account Number: [id number] ?Date of Birth/Sex: Treating RN: ?[birth year] (56 y.o. Sue Lush ?Primary Care Provider: [name] Other Clinician: ?Referring Provider: ?Treating Provider/Extender: Kalman Shan ?[name] ?Weeks in Treatment: 0 ?Information Obtained from: Patient ?Chief Complaint ?05/26/2021; Left lower extremity wound ?Electronic Signature(s) ?Signed: 05/31/2021 3:51:06 PM By: Kalman Shan DO ?Entered By: Kalman Shan on 05/31/2021 15:19:58 ?-------------------------------------------------------------------------------- ?HPI Details ?Patient Name: Date of Service: ?[name], [name] 05/31/2021 2:00 PM ?Medical Record Number: [id number] ?Patient Account Number: [id number] ?Date of Birth/Sex: Treating RN: ?1965-04-03 (56 y.o. Sue Lush ?Primary Care Provider: [name] Other Clinician: ?Referring Provider: ?Treating Provider/Extender: Kalman Shan ?[name] ?Weeks in Treatment: 0 ?History of Present Illness ?HPI Description: Admission 05/26/2021 ?Ms. [name] is a 56 year old female with a past medical history of lymphedema That presents to the clinic for a 80-monthhistory of nonhealing ulcer to ?the posterior left leg. She has not been using any dressing to the wound bed. She has never experienced wounds before. She states that she sleeps in a ?recliner with her feet down. She has lymphedema pumps however has not been using them due to the wound and the increased swelling to her legs. She ?currently denies signs of infection. ?4/4; patient presents for follow-up. She states that the wrap slid down after a couple days. She states she tolerated it well prior to her coming loose. She denies ?signs of infection. ?Electronic Signature(s) ?Signed: 05/31/2021  3:51:06 PM By: HKalman ShanDO ?Entered By: HKalman Shanon 05/31/2021 15:20:54 ?-------------------------------------------------------------------------------- ?Physical Exam Details ?Patient Name: Date of Service: ?HVIRGILENE, [name]. 05/31/2021 2:00 PM ?Medical Record Number: [id number]?Patient Account Number: [id number]?Date of Birth/Sex: Treating RN: ?[birth year](56y.o. FSue Lush?Primary Care Provider: Other Clinician: ?FGeryl Rankins?Referring Provider: ?Treating Provider/Extender: HKalman Shan?FGeryl Rankins?Weeks in Treatment: 0 ?Constitutional ?respirations regular, non-labored and within target range for patient..Marland Kitchen?Cardiovascular ?2+ dorsalis pedis/posterior tibialis pulses. ?Psychiatric ?pleasant and cooperative. ?Notes ?Left lower extremity: Open weeping area to the posterior aspect With nonviable tissue throughout. She has stage IV lymphedema. No surrounding signs of ?Soft tissue infection. ?Electronic Signature(s) ?Signed: 05/31/2021 3:51:06 PM By: HKalman ShanDO ?Entered By: HKalman Shanon 05/31/2021 15:21:37 ?-------------------------------------------------------------------------------- ?Physician Orders Details ?Patient Name: Date of Service: ?[name], Lauren. 05/31/2021 2:00 PM ?Medical Record Number: [id number]?Patient Account Number: [id number]?Date of Birth/Sex: Treating RN: ?[birth year](56y.o. F) Deaton, Bobbi ?Primary Care Provider: FGeryl RankinsOther Clinician: ?Referring Provider: ?Treating Provider/Extender: HKalman Shan?FGeryl Rankins?Weeks in Treatment: 0 ?Verbal / Phone Orders: No ?Diagnosis Coding ?ICD-10 Coding ?Code Description ?LM03.491Non-pressure chronic ulcer of other part of left lower leg with fat layer exposed ?I89.0 Lymphedema, not elsewhere classified ?Follow-up Appointments ?ppointment in 1 week. - with Dr. HHeber [place](Dr. RMalachi Paradise and BWaianae Room 8 06/07/2021 130pm ?Return A ?Bathing/ Shower/ Hygiene ?May shower with  protection but do not get wound dressing(s) wet. - [facility] [place]?Edema Control - Lymphedema / SCD / Other ?Lymphedema Pumps. Use Lymphedema pumps on leg(s) 2-3 times a day for 45-60 minutes. If wearing any wraps or hose, do not remove ?them. Continue exercising as instructed. - Use as directed ?Elevate legs to the level of the heart or above for 30 minutes daily and/or when sitting, a frequency of: - throughout the day ?Avoid standing  for long periods of time. ?Moisturize legs daily. ?Wound Treatment ?Wound #1 - Lower Leg Wound Laterality: Left, Posterior ?Cleanser: Soap and Water 1 x Per Week/30 Days ?Discharge Instructions: May shower and wash wound with dial antibacterial soap and water prior to dressing change. ?Cleanser: Wound Cleanser 1 x Per Week/30 Days ?Discharge Instructions: Cleanse the wound with wound cleanser prior to applying a clean dressing using gauze sponges, not tissue or cotton balls. ?Peri-Wound Care: Ketoconazole Cream 2% 1 x Per Week/30 Days ?Discharge Instructions: Apply Ketoconazole as directed ?Peri-Wound Care: Triamcinolone 15 (g) 1 x Per Week/30 Days ?Discharge Instructions: Use triamcinolone 15 (g) as directed ?Peri-Wound Care: Zinc Oxide Ointment 30g tube 1 x Per Week/30 Days ?Discharge Instructions: Apply Zinc Oxide to periwound with each dressing change ?Peri-Wound Care: Sween Lotion (Moisturizing lotion) 1 x Per Week/30 Days ?Discharge Instructions: Apply moisturizing lotion as directed ?Topical: Gentamicin 1 x Per Week/30 Days ?Discharge Instructions: As directed by physician ?Prim Dressing: KerraCel Ag Gelling Fiber Dressing, 4x5 in (silver alginate) 1 x Per Week/30 Days ?ary ?Discharge Instructions: Apply silver alginate to wound bed as instructed ?Secondary Dressing: ABD Pad, 5x9 1 x Per Week/30 Days ?Discharge Instructions: Apply over primary dressing as directed. ?Secondary Dressing: Zetuvit Plus 4x8 in 1 x Per Week/30 Days ?Discharge  Instructions: Apply over primary dressing as directed. ?Secondary Dressing: CarboFLEX Odor Control Dressing, 4x4 in 1 x Per Week/30 Days ?Discharge Instructions: Apply over primary dressing as directed. ?Compression Wrap: ThreePress (3 layer compression wrap) 1 x Per Week/30 Days ?Discharge Instructions: Apply three layer compression as directed. ?Laboratory ?erobe culture (MICRO) ?Bacteria identified in Unspecified specimen by A ?LOINC Code: 027-2 ?Convenience Name: Aerobic culture-specimen not specified ?Electronic Signature(s) ?Signed: 05/31/2021 3:51:06 PM By: Kalman Shan DO ?Entered By: Kalman Shan on 05/31/2021 15:21:59 ?-------------------------------------------------------------------------------- ?Problem List Details ?Patient Name: ?Date of Service: ?META, KROENKE E. 05/31/2021 2:00 PM ?Medical Record Number: [id number] ?Patient Account Number: [id number] ?Date of Birth/Sex: ?Treating RN: ?[birth year] (56 y.o. F) Deaton, Bobbi ?Primary Care Provider: [name] ?Other Clinician: ?Referring Provider: ?Treating Provider/Extender: Kalman Shan ?[name] ?Weeks in Treatment: 0 ?Active Problems ?ICD-10 ?Encounter ?Code Description Active Date MDM ?Diagnosis ?V42.595 Non-pressure chronic ulcer of other part of left lower leg with fat layer exposed3/30/2023 No Yes ?I89.0 Lymphedema, not elsewhere classified 05/26/2021 No Yes ?Inactive Problems ?Resolved Problems ?Electronic Signature(s) ?Signed: 05/31/2021 3:51:06 PM By: Kalman Shan DO ?Entered By: Kalman Shan on 05/31/2021 15:19:36 ?-------------------------------------------------------------------------------- ?Progress Note Details ?Patient Name: Date of Service: ?ALLEE, BUSK E. 05/31/2021 2:00 PM ?Medical Record Number: [id number] ?Patient Account Number: [id number] ?Date of Birth/Sex: Treating RN: ?Feb 16, 1966 (56 y.o. Sue Lush ?Primary Care Provider: [name] Other Clinician: ?Referring Provider: ?Treating  Provider/Extender: Kalman Shan ?[name] ?Weeks in Treatment: 0 ?Subjective ?Chief Complaint ?Information obtained from Patient ?05/26/2021; Left lower extremity wound ?History of Present Illness (HPI) ?Ad

## 2021-06-02 NOTE — Progress Notes (Signed)
HARMONY, SANDELL ([id number]) ?Visit Report for 05/31/2021 ?Arrival Information Details ?Patient Name: Date of Service: ?[name], Lauren E. 05/31/2021 2:00 PM ?Medical Record Number: [id number] ?Patient Account Number: [id number] ?Date of Birth/Sex: Treating RN: ?29-Sep-1965 (56 y.o. Sue Lush ?Primary Care Chinonso Linker: [name] Other Clinician: ?Referring Chassity Ludke: ?Treating Sharicka Pogorzelski/Extender: Kalman Shan ?[name] ?Weeks in Treatment: 0 ?Visit Information History Since Last Visit ?Added or deleted any medications: No ?Patient Arrived: Ambulatory ?Any new allergies or adverse reactions: No ?Arrival Time: 14:21 ?Had a fall or experienced change in No ?Accompanied By: self ?activities of daily living that may affect ?Transfer Assistance: None ?risk of falls: ?Patient Identification Verified: Yes ?Signs or symptoms of abuse/neglect since last visito No ?Secondary Verification Process Completed: Yes ?Hospitalized since last visit: No ?Patient Requires Transmission-Based Precautions: No ?Has Dressing in Place as Prescribed: Yes ?Patient Has Alerts: Yes ?Pain Present Now: Yes ?Patient Alerts: Patient on Blood Thinner ?Electronic Signature(s) ?Signed: 06/02/2021 4:07:00 PM By: Sandre Kitty ?Entered By: Sandre Kitty on 05/31/2021 14:22:16 ?-------------------------------------------------------------------------------- ?Compression Therapy Details ?Patient Name: Date of Service: ?[name], Lauren E. 05/31/2021 2:00 PM ?Medical Record Number: [id number] ?Patient Account Number: [id number] ?Date of Birth/Sex: Treating RN: ?[birth year] (56 y.o. F) Deaton, Bobbi ?Primary Care Jamon Hayhurst: [name] Other Clinician: ?Referring Michiel Sivley: ?Treating Amamda Curbow/Extender: Kalman Shan ?[name] ?Weeks in Treatment: 0 ?Compression Therapy Performed for Wound Assessment: Wound #1 Left,Posterior Lower Leg ?Performed By: Clinician Deon Pilling, RN ?Compression Type: Three Layer ?Post Procedure  Diagnosis ?Same as Pre-procedure ?Electronic Signature(s) ?Signed: 05/31/2021 4:56:30 PM By: Deon Pilling RN, BSN ?Entered By: Deon Pilling on 05/31/2021 14:53:09 ?-------------------------------------------------------------------------------- ?Encounter Discharge Information Details ?Patient Name: Date of Service: ?[name], Lauren E. 05/31/2021 2:00 PM ?Medical Record Number: [id number] ?Patient Account Number: [id number] ?Date of Birth/Sex: ?Treating RN: ?11-02-1965 (56 y.o. F) Deaton, Bobbi ?Primary Care Brennan Litzinger: [name] ?Other Clinician: ?Referring Rajan Burgard: ?Treating Ahad Colarusso/Extender: Kalman Shan ?[name] ?Weeks in Treatment: 0 ?Encounter Discharge Information Items ?Discharge Condition: Stable ?Ambulatory Status: Ambulatory ?Discharge Destination: Home ?Transportation: Private Auto ?Accompanied By: self ?Schedule Follow-up Appointment: Yes ?Clinical Summary of Care: ?Electronic Signature(s) ?Signed: 05/31/2021 4:56:30 PM By: Deon Pilling RN, BSN ?Entered By: Deon Pilling on 05/31/2021 14:56:37 ?-------------------------------------------------------------------------------- ?Lower Extremity Assessment Details ?Patient Name: ?Date of Service: ?[name], Lauren E. 05/31/2021 2:00 PM ?Medical Record Number: [id number] ?Patient Account Number: [id number] ?Date of Birth/Sex: ?Treating RN: ?1965/08/19 (56 y.o. F) Deaton, Bobbi ?Primary Care Kermitt Harjo: [name] ?Other Clinician: ?Referring Jonanthan Bolender: ?Treating Mardee Clune/Extender: Kalman Shan ?[name] ?Weeks in Treatment: 0 ?Edema Assessment ?Assessed: [Left: Yes] [Right: No] ?Edema: [Left: Ye] [Right: s] ?Calf ?Left: Right: ?Point of Measurement: 33 cm From Medial Instep 78 cm ?Ankle ?Left: Right: ?Point of Measurement: 12 cm From Medial Instep 60 cm ?Vascular Assessment ?Pulses: ?Dorsalis Pedis ?Palpable: [Left:Yes] ?Electronic Signature(s) ?Signed: 05/31/2021 4:56:30 PM By: Deon Pilling RN, BSN ?Entered By: Deon Pilling on 05/31/2021  14:38:42 ?-------------------------------------------------------------------------------- ?Multi Wound Chart Details ?Patient Name: ?Date of Service: ?[name], Lauren E. 05/31/2021 2:00 PM ?Medical Record Number: [id number] ?Patient Account Number: [id number] ?Date of Birth/Sex: ?Treating RN: ?Jul 02, 1965 (56 y.o. Sue Lush ?Primary Care Regina Ganci: [name] ?Other Clinician: ?Referring Dana Debo: ?Treating Necia Kamm/Extender: Kalman Shan ?[name] ?Weeks in Treatment: 0 ?Vital Signs ?Height(in): 72 ?Pulse(bpm): 84 ?Weight(lbs): 359 ?Blood Pressure(mmHg): 132/74 ?Body Mass Index(BMI): 48.7 ?Temperature(??F): 98.4 ?Respiratory Rate(breaths/min): 22 ?Photos: [N/A:N/A] ?Left, Posterior Lower Leg N/A N/A ?Wound Location: ?Gradually Appeared N/A N/A ?Wounding Event: ?Lymphedema N/A N/A ?Primary Etiology: ?Cataracts, Anemia, Lymphedema, N/A N/A ?Comorbid History: ?Asthma, Deep Vein Thrombosis, ?Hypertension, Osteoarthritis ?02/25/2021 N/A  N/A ?Date Acquired: ?0 N/A N/A ?Weeks of Treatment: ?Open N/A N/A ?Wound Status: ?No N/A N/A ?Wound Recurrence: ?11x12x0.1 N/A N/A ?Measurements L x W x D (cm) ?103.673 N/A N/A ?A (cm?) : ?rea ?10.367 N/A N/A ?Volume (cm?) : ?-32.00% N/A N/A ?% Reduction in Area: ?-32.00% N/A N/A ?% Reduction in Volume: ?Full Thickness Without Exposed N/A N/A ?Classification: ?Support Structures ?Large N/A N/A ?Exudate Amount: ?Serous N/A N/A ?Exudate Type: ?amber N/A N/A ?Exudate Color: ?Distinct, outline attached N/A N/A ?Wound Margin: ?Medium (34-66%) N/A N/A ?Granulation Amount: ?Red, Pink N/A N/A ?Granulation Quality: ?Medium (34-66%) N/A N/A ?Necrotic Amount: ?Fat Layer (Subcutaneous Tissue): Yes N/A N/A ?Exposed Structures: ?Fascia: No ?Tendon: No ?Muscle: No ?Joint: No ?Bone: No ?None N/A N/A ?Epithelialization: ?Compression Therapy N/A N/A ?Procedures Performed: ?Treatment Notes ?Wound #1 (Lower Leg) Wound Laterality: Left, Posterior ?Cleanser ?Soap and Water ?Discharge  Instruction: May shower and wash wound with dial antibacterial soap and water prior to dressing change. ?Wound Cleanser ?Discharge Instruction: Cleanse the wound with wound cleanser prior to applying a clean dressing using gauze sponges, not tissue or cotton balls. ?Peri-Wound Care ?Triamcinolone 15 (g) ?Discharge Instruction: Use triamcinolone 15 (g) as directed ?Zinc Oxide Ointment 30g tube ?Discharge Instruction: Apply Zinc Oxide to periwound with each dressing change ?Sween Lotion (Moisturizing lotion) ?Discharge Instruction: Apply moisturizing lotion as directed ?Topical ?Gentamicin ?Discharge Instruction: As directed by physician ?Primary Dressing ?KerraCel Ag Gelling Fiber Dressing, 4x5 in (silver alginate) ?Discharge Instruction: Apply silver alginate to wound bed as instructed ?Secondary Dressing ?ABD Pad, 5x9 ?Discharge Instruction: Apply over primary dressing as directed. ?Zetuvit Plus 4x8 in ?Discharge Instruction: Apply over primary dressing as directed. ?Secured With ?Compression Wrap ?ThreePress (3 layer compression wrap) ?Discharge Instruction: Apply three layer compression as directed. ?Compression Stockings ?Add-Ons ?Electronic Signature(s) ?Signed: 05/31/2021 3:51:06 PM By: Kalman Shan DO ?Signed: 05/31/2021 4:45:45 PM By: [name] ?Entered By: Kalman Shan on 05/31/2021 15:19:45 ?-------------------------------------------------------------------------------- ?Multi-Disciplinary Care Plan Details ?Patient Name: ?Date of Service: ?[name], Lauren E. 05/31/2021 2:00 PM ?Medical Record Number: [id number] ?Patient Account Number: [id number] ?Date of Birth/Sex: ?Treating RN: ?[birth year] (56 y.o. F) Deaton, Bobbi ?Primary Care Agata Lucente: [name] ?Other Clinician: ?Referring Juanantonio Stolar: ?Treating Aoki Wedemeyer/Extender: Kalman Shan ?[name] ?Weeks in Treatment: 0 ?Active Inactive ?Venous Leg Ulcer ?Nursing Diagnoses: ?Actual venous Insuffiency (use after diagnosis is  confirmed) ?Goals: ?Patient will maintain optimal edema control ?Date Initiated: 05/26/2021 ?Target Resolution Date: 06/23/2021 ?Goal Status: Active ?Interventions: ?Assess peripheral edema status every visit. ?Compression as ordered ?Treat

## 2021-06-07 ENCOUNTER — Encounter (HOSPITAL_BASED_OUTPATIENT_CLINIC_OR_DEPARTMENT_OTHER): Payer: Medicare HMO | Admitting: Internal Medicine

## 2021-06-07 DIAGNOSIS — L03116 Cellulitis of left lower limb: Secondary | ICD-10-CM | POA: Diagnosis not present

## 2021-06-07 DIAGNOSIS — I890 Lymphedema, not elsewhere classified: Secondary | ICD-10-CM | POA: Diagnosis not present

## 2021-06-07 DIAGNOSIS — L97222 Non-pressure chronic ulcer of left calf with fat layer exposed: Secondary | ICD-10-CM | POA: Diagnosis not present

## 2021-06-07 DIAGNOSIS — L97822 Non-pressure chronic ulcer of other part of left lower leg with fat layer exposed: Secondary | ICD-10-CM | POA: Diagnosis not present

## 2021-06-07 NOTE — Progress Notes (Signed)
Lauren, [name] ([id number]) ?Visit Report for 06/07/2021 ?HPI Details ?Patient Name: Date of Service: ?[name], Lauren E. 06/07/2021 1:30 PM ?Medical Record Number: [id number] ?Patient Account Number: [id number] ?Date of Birth/Sex: Treating RN: ?September 25, 1965 (57 y.o. F) Deaton, Bobbi ?Primary Care Provider: [name] Other Clinician: ?Referring Provider: ?Treating Provider/Extender: Linton [name] ?[name] ?Weeks in Treatment: 1 ?History of Present Illness ?HPI Description: Admission 05/26/2021 ?Ms. [name] is a 56 year old female with a past medical history of lymphedema That presents to the clinic for a 49-monthhistory of nonhealing ulcer to ?the posterior left leg. She has not been using any dressing to the wound bed. She has never experienced wounds before. She states that she sleeps in a ?recliner with her feet down. She has lymphedema pumps however has not been using them due to the wound and the increased swelling to her legs. She ?currently denies signs of infection. ?4/4; patient presents for follow-up. She states that the wrap slid down after a couple days. She states she tolerated it well prior to her coming loose. She denies ?signs of infection. ?4/11 comes in today with a substantial wound on her left posterior calf. This is superficial in the setting of severe lymphedema. We have been using 3 layer ?compression. She had a PCR culture done last week which showed extremely high titers of Morganella but also high titers of Enterobacter and E. coli. [facility] ?The patient is not systemically unwell no fever no chills. Our intake nurse noted very significant odor however ?Electronic Signature(s) ?Signed: 06/07/2021 4:12:10 PM By: Lauren HamMD ?Entered By: Lauren Hamon 06/07/2021 14:30:59 ?-------------------------------------------------------------------------------- ?Physical Exam Details ?Patient  Name: Date of Service: ?[name], [name]. 06/07/2021 1:30 PM ?Medical Record Number: [id number]?Patient Account Number: [id number]?Date of Birth/Sex: Treating RN: ?[birth year](56y.o. F) Deaton, Bobbi ?Primary Care Provider: FGeryl RankinsOther Clinician: ?Referring Provider: ?Treating Provider/Extender: [name]?FGeryl Rankins?Weeks in Treatment: 1 ?Constitutional ?Sitting or standing Blood Pressure is within target range for patient.. Pulse regular and within target range for patient..Marland KitchenRespirations regular, non-labored and ?within target range.. Temperature is normal and within the target range for the patient..Marland KitchenAppears in no distress. She does not appear to be systemically unwell. ?Notes ?Wound exam; left lower extremity posteriorly. Wound was 100% covered by surface slough which was somewhat adherent. We used wound cleanser and gauze ?did vigorously debride this I did not think a mechanical debridement was indicated. It was a bit difficult to determine however there is some much darker tissue ?around the wound going more posteriorly with some tenderness this may represent a cellulitis/erysipelas/erythema. Indeed there was a very pungent odor. ?Electronic Signature(s) ?Signed: 06/07/2021 4:12:10 PM By: Lauren HamMD ?Entered By: Lauren Hamon 06/07/2021 14:32:41 ?-------------------------------------------------------------------------------- ?Physician Orders Details ?Patient Name: ?Date of Service: ?[name]. 06/07/2021 1:30 PM ?Medical Record Number: [id number]?Patient Account Number: [id number]?Date of Birth/Sex: ?Treating RN: ?[birth year](56y.o. F) Deaton, Bobbi ?Primary Care Provider: FGeryl Rankins?Other Clinician: ?Referring Provider: ?Treating Provider/Extender: [name]?FGeryl Rankins?Weeks in Treatment: 1 ?Verbal / Phone Orders: No ?Diagnosis Coding ?ICD-10 Coding ?Code Description ?LD42.876Non-pressure chronic ulcer of other part of left lower leg with fat layer  exposed ?I89.0 Lymphedema, not elsewhere classified ?Follow-up Appointments ?ppointment in 1 week. - with Dr. HHeber Carolinaand BMonroe Room [name] 06/07/2021 130pm ?Return A ?Other: - [facility] topical antibiotics. They will ship it to  you. ?Go to your pharmacy to pick up oral antibiotics. ?Bathing/ Shower/ Hygiene ?May shower with protection but do not get wound dressing(s) wet. - Can get cast protector bag at [facility], [facility] or [facility] ?Edema Control - Lymphedema / SCD / Other ?Lymphedema Pumps. Use Lymphedema pumps on leg(s) 2-3 times a day for 45-60 minutes. If wearing any wraps or hose, do not remove ?them. Continue exercising as instructed. - Use as directed ?Elevate legs to the level of the heart or above for 30 minutes daily and/or when sitting, a frequency of: - throughout the day ?Avoid standing for long periods of time. ?Moisturize legs daily. ?Wound Treatment ?Wound #1 - Lower Leg Wound Laterality: Left, Posterior ?Cleanser: Soap and Water 1 x Per Week/30 Days ?Discharge Instructions: May shower and wash wound with dial antibacterial soap and water prior to dressing change. ?Cleanser: Wound Cleanser 1 x Per Week/30 Days ?Discharge Instructions: Cleanse the wound with wound cleanser prior to applying a clean dressing using gauze sponges, not tissue or cotton balls. ?Peri-Wound Care: Ketoconazole Cream 2% 1 x Per Week/30 Days ?Discharge Instructions: Apply Ketoconazole as directed ?Peri-Wound Care: Triamcinolone 15 (g) 1 x Per Week/30 Days ?Discharge Instructions: Use triamcinolone 15 (g) as directed ?Peri-Wound Care: Zinc Oxide Ointment 30g tube 1 x Per Week/30 Days ?Discharge Instructions: Apply Zinc Oxide to periwound with each dressing change ?Peri-Wound Care: Sween Lotion (Moisturizing lotion) 1 x Per Week/30 Days ?Discharge Instructions: Apply moisturizing lotion as directed ?Prim Dressing: KerraCel Ag Gelling Fiber Dressing, 4x5 in (silver alginate) 1 x Per Week/30  Days ?ary ?Discharge Instructions: Apply silver alginate to wound bed as instructed ?Secondary Dressing: ABD Pad, 5x9 1 x Per Week/30 Days ?Discharge Instructions: Apply over primary dressing as directed. ?Secondary Dressing: Zetuvit Plus 4x8 in 1 x Per Week/30 Days ?Discharge Instructions: Apply over primary dressing as directed. ?Secondary Dressing: CarboFLEX Odor Control Dressing, 4x4 in 1 x Per Week/30 Days ?Discharge Instructions: Apply over primary dressing as directed. ?Compression Wrap: FourPress (4 layer compression wrap) 1 x Per Week/30 Days ?Discharge Instructions: Apply four layer compression as directed. May also use [place] CoFlex 2 layer compression system as ?alternative. ?Patient Medications ?llergies: abatacept, bee venom protein (honey bee), latex, Augmentin, Nulytely ?A ?Notifications Medication Indication Start End ?celluitis left lower leg 06/07/2021 ?Cipro ?DOSE oral 500 mg tablet - 1 tablet oral bid for 5 days ?Electronic Signature(s) ?Signed: 06/07/2021 2:40:53 PM By: Linton Ham MD ?Entered By: Linton [name] on 06/07/2021 14:40:53 ?-------------------------------------------------------------------------------- ?Problem List Details ?Patient Name: ?Date of Service: ?[name], Lauren E. 06/07/2021 1:30 PM ?Medical Record Number: [id number] ?Patient Account Number: [id number] ?Date of Birth/Sex: ?Treating RN: ?Dec 10, 1965 (56 y.o. F) Deaton, Bobbi ?Primary Care Provider: [name] ?Other Clinician: ?Referring Provider: ?Treating Provider/Extender: Linton [name] ?[name] ?Weeks in Treatment: 1 ?Active Problems ?ICD-10 ?Encounter ?Code Description Active Date MDM ?Diagnosis ?G28.366 Non-pressure chronic ulcer of other part of left lower leg with fat layer exposed3/30/2023 No Yes ?I89.0 Lymphedema, not elsewhere classified 05/26/2021 No Yes ?L03.116 Cellulitis of left lower limb 06/07/2021 No Yes ?Inactive Problems ?Resolved Problems ?Electronic Signature(s) ?Signed: 06/07/2021  4:12:10 PM By: Linton Ham MD ?Entered By: Linton [name] on 06/07/2021 14:29:00 ?-------------------------------------------------------------------------------- ?Progress Note Details ?Patient Name: ?Date of Lewanda Rife

## 2021-06-07 NOTE — Progress Notes (Signed)
[name], [name] ([id number]) ?Visit Report for 06/07/2021 ?Arrival Information Details ?Patient Name: Date of Service: ?[name], Lauren E. 06/07/2021 1:30 PM ?Medical Record Number: [id number] ?Patient Account Number: [id number] ?Date of Birth/Sex: Treating RN: ?12-18-1965 (56 y.o. F) Deaton, Bobbi ?Primary Care Linard Daft: [name] Other Clinician: ?Referring James Lafalce: ?Treating Shamar Engelmann/Extender: Linton Ham ?[name] ?Weeks in Treatment: 1 ?Visit Information History Since Last Visit ?Added or deleted any medications: No ?Patient Arrived: [name] ?Any new allergies or adverse reactions: No ?Arrival Time: 13:47 ?Had a fall or experienced change in No ?Accompanied By: self ?activities of daily living that may affect ?Transfer Assistance: None ?risk of falls: ?Patient Identification Verified: Yes ?Signs or symptoms of abuse/neglect since last visito No ?Secondary Verification Process Completed: Yes ?Hospitalized since last visit: No ?Patient Requires Transmission-Based Precautions: No ?Implantable device outside of the clinic excluding No ?Patient Has Alerts: Yes ?cellular tissue based products placed in the center ?Patient Alerts: Patient on Blood Thinner since last visit: ?Has Dressing in Place as Prescribed: Yes ?Has Compression in Place as Prescribed: Yes ?Pain Present Now: Yes ?Notes ?per patient compression wrap slid down this morning. ?Electronic Signature(s) ?Signed: 06/07/2021 4:48:28 PM By: Deon Pilling RN, BSN ?Entered By: Deon Pilling on 06/07/2021 13:48:33 ?-------------------------------------------------------------------------------- ?Compression Therapy Details ?Patient Name: Date of Service: ?[name], Lauren E. 06/07/2021 1:30 PM ?Medical Record Number: [id number] ?Patient Account Number: [id number] ?Date of Birth/Sex: Treating RN: ?10/07/1965 (56 y.o. F) Deaton, Bobbi ?Primary Care Idalis Hoelting: [name] Other Clinician: ?Referring Kilie Rund: ?Treating Shannelle Alguire/Extender: Linton Ham ?[name] ?Weeks in Treatment: 1 ?Compression Therapy Performed for Wound Assessment: Wound #1 Left,Posterior Lower Leg ?Performed By: Clinician Deon Pilling, RN ?Compression Type: Four Layer ?Post Procedure Diagnosis ?Same as Pre-procedure ?Electronic Signature(s) ?Signed: 06/07/2021 4:48:28 PM By: Deon Pilling RN, BSN ?Entered By: Deon Pilling on 06/07/2021 14:23:14 ?-------------------------------------------------------------------------------- ?Encounter Discharge Information Details ?Patient Name: ?Date of Service: ?[name], Lauren E. 06/07/2021 1:30 PM ?Medical Record Number: [id number] ?Patient Account Number: [id number] ?Date of Birth/Sex: ?Treating RN: ?1965/03/08 (56 y.o. F) Deaton, Bobbi ?Primary Care Levia Waltermire: [name] ?Other Clinician: ?Referring Kamari Bilek: ?Treating Phoenyx Melka/Extender: Linton Ham ?[name] ?Weeks in Treatment: 1 ?Encounter Discharge Information Items ?Discharge Condition: Stable ?Ambulatory Status: [name] ?Discharge Destination: Home ?Transportation: Private Auto ?Accompanied By: self ?Schedule Follow-up Appointment: Yes ?Clinical Summary of Care: ?Electronic Signature(s) ?Signed: 06/07/2021 4:48:28 PM By: Deon Pilling RN, BSN ?Entered By: Deon Pilling on 06/07/2021 16:14:25 ?-------------------------------------------------------------------------------- ?Lower Extremity Assessment Details ?Patient Name: ?Date of Service: ?[name], Lauren E. 06/07/2021 1:30 PM ?Medical Record Number: [id number] ?Patient Account Number: [id number] ?Date of Birth/Sex: ?Treating RN: ?February 24, 1966 (56 y.o. F) Deaton, Bobbi ?Primary Care Marylynn Rigdon: [name] ?Other Clinician: ?Referring Makinlee Awwad: ?Treating Nicoletta Hush/Extender: Linton Ham ?[name] ?Weeks in Treatment: 1 ?Edema Assessment ?Assessed: [Left: Yes] [Right: No] ?Edema: [Left: Ye] [Right: s] ?Calf ?Left: Right: ?Point of Measurement: 33 cm From Medial Instep 78 cm ?Ankle ?Left: Right: ?Point of Measurement:  12 cm From Medial Instep 51 cm ?Vascular Assessment ?Pulses: ?Dorsalis Pedis ?Palpable: [Left:No] ?Electronic Signature(s) ?Signed: 06/07/2021 4:48:28 PM By: Deon Pilling RN, BSN ?Entered By: Deon Pilling on 06/07/2021 13:54:06 ?-------------------------------------------------------------------------------- ?Multi Wound Chart Details ?Patient Name: ?Date of Service: ?[name], Lauren E. 06/07/2021 1:30 PM ?Medical Record Number: [id number] ?Patient Account Number: [id number] ?Date of Birth/Sex: ?Treating RN: ?11-May-1965 (56 y.o. F) Deaton, Bobbi ?Primary Care Rami Waddle: [name] ?Other Clinician: ?Referring Dewanda Fennema: ?Treating Andree Golphin/Extender: Linton Ham ?[name] ?Weeks in Treatment: 1 ?Vital Signs ?Height(in): 72 ?Pulse(bpm): 96 ?Weight(lbs): 359 ?Blood Pressure(mmHg): 146/73 ?Body Mass Index(BMI): 48.7 ?Temperature(??F): 97.9 ?Respiratory Rate(breaths/min):  20 ?Photos: [N/A:N/A] ?Left, Posterior Lower Leg N/A N/A ?Wound Location: ?Gradually Appeared N/A N/A ?Wounding Event: ?Lymphedema N/A N/A ?Primary Etiology: ?Cataracts, Anemia, Lymphedema, N/A N/A ?Comorbid History: ?Asthma, Deep Vein Thrombosis, ?Hypertension, Osteoarthritis ?02/25/2021 N/A N/A ?Date Acquired: ?1 N/A N/A ?Weeks of Treatment: ?Open N/A N/A ?Wound Status: ?No N/A N/A ?Wound Recurrence: ?11x18x0.1 N/A N/A ?Measurements L x W x D (cm) ?155.509 N/A N/A ?A (cm?) : ?rea ?15.551 N/A N/A ?Volume (cm?) : ?-98.00% N/A N/A ?% Reduction in Area: ?-98.00% N/A N/A ?% Reduction in Volume: ?Full Thickness Without Exposed N/A N/A ?Classification: ?Support Structures ?Large N/A N/A ?Exudate Amount: ?Purulent N/A N/A ?Exudate Type: ?yellow, brown, green N/A N/A ?Exudate Color: ?Yes N/A N/A ?Foul Odor A Cleansing: ?fter ?No N/A N/A ?Odor Anticipated Due to Product ?Use: ?Distinct, outline attached N/A N/A ?Wound Margin: ?Medium (34-66%) N/A N/A ?Granulation A mount: ?Red, Pink N/A N/A ?Granulation Quality: ?Medium (34-66%) N/A N/A ?Necrotic  Amount: ?Fat Layer (Subcutaneous Tissue): Yes N/A N/A ?Exposed Structures: ?Fascia: No ?Tendon: No ?Muscle: No ?Joint: No ?Bone: No ?None N/A N/A ?Epithelialization: ?Compression Therapy N/A N/A ?Procedures Performed: ?Treatment Notes ?Electronic Signature(s) ?Signed: 06/07/2021 4:12:10 PM By: Linton Ham MD ?Signed: 06/07/2021 4:48:28 PM By: Deon Pilling RN, BSN ?Entered By: Linton Ham on 06/07/2021 14:29:20 ?-------------------------------------------------------------------------------- ?Multi-Disciplinary Care Plan Details ?Patient Name: ?Date of Service: ?[name], Lauren E. 06/07/2021 1:30 PM ?Medical Record Number: [id number] ?Patient Account Number: [id number] ?Date of Birth/Sex: ?Treating RN: ?16-May-1965 (56 y.o. F) Deaton, Bobbi ?Primary Care Barett Whidbee: [name] ?Other Clinician: ?Referring Decker Cogdell: ?Treating Hermann Dottavio/Extender: Linton Ham ?[name] ?Weeks in Treatment: 1 ?Active Inactive ?Venous Leg Ulcer ?Nursing Diagnoses: ?Actual venous Insuffiency (use after diagnosis is confirmed) ?Goals: ?Patient will maintain optimal edema control ?Date Initiated: 05/26/2021 ?Target Resolution Date: 06/23/2021 ?Goal Status: Active ?Interventions: ?Assess peripheral edema status every visit. ?Compression as ordered ?Treatment Activities: ?Therapeutic compression applied : 05/26/2021 ?Notes: ?Wound/Skin Impairment ?Nursing Diagnoses: ?Impaired tissue integrity ?Goals: ?Patient/caregiver will verbalize understanding of skin care regimen ?Date Initiated: 05/26/2021 ?Target Resolution Date: 06/23/2021 ?Goal Status: Active ?Ulcer/skin breakdown will have a volume reduction of 30% by week 4 ?Date Initiated: 05/26/2021 ?Target Resolution Date: 06/23/2021 ?Goal Status: Active ?Interventions: ?Assess patient/caregiver ability to obtain necessary supplies ?Assess patient/caregiver ability to perform ulcer/skin care regimen upon admission and as needed ?Assess ulceration(s) every visit ?Provide education on ulcer  and skin care ?Treatment Activities: ?Topical wound management initiated : 05/26/2021 ?Notes: ?Electronic Signature(s) ?Signed: 06/07/2021 4:48:28 PM By: Deon Pilling RN, BSN ?Entered By: Deon Pilling on 04/1

## 2021-06-13 ENCOUNTER — Ambulatory Visit (INDEPENDENT_AMBULATORY_CARE_PROVIDER_SITE_OTHER): Payer: Medicare HMO

## 2021-06-13 ENCOUNTER — Encounter: Payer: Self-pay | Admitting: Podiatry

## 2021-06-13 ENCOUNTER — Ambulatory Visit: Payer: Medicare HMO | Admitting: Podiatry

## 2021-06-13 DIAGNOSIS — M79674 Pain in right toe(s): Secondary | ICD-10-CM

## 2021-06-13 DIAGNOSIS — I890 Lymphedema, not elsewhere classified: Secondary | ICD-10-CM | POA: Diagnosis not present

## 2021-06-13 DIAGNOSIS — B351 Tinea unguium: Secondary | ICD-10-CM | POA: Diagnosis not present

## 2021-06-13 DIAGNOSIS — M79675 Pain in left toe(s): Secondary | ICD-10-CM

## 2021-06-13 NOTE — Progress Notes (Signed)
SITUATION ?Reason for Visit: Fitting of Diabetic [place] ?Patient / Caregiver Report:  Patient is satisfied with fit and function of insoles. ? ?OBJECTIVE DATA: ?Patient History / Diagnosis:   ?  ICD-10-CM   ?1. Lymphedema of both lower extremities  I89.0   ?  ?2. Elephantiasis  I89.0   ?  ? ?Change in Status:   None ? ?ACTIONS PERFORMED: ?In-Person Delivery, patient was fit with: ?- 3x pair [phone] PDAC approved vacuum formed custom diabetic insoles; [employer]: [id number] ? ?Existing Shoes and new insoles were verified for structural integrity and safety. Patient wore shoes and insoles in office. Skin was inspected and free of areas of concern after wearing shoes and inserts. Shoes and inserts fit properly. Patient / Caregiver provided with ferbal instruction and demonstration regarding donning, doffing, wear, care, proper fit, function, purpose, cleaning, and use of shoes and insoles ' and in all related precautions and risks and benefits regarding shoes and insoles. Patient / Caregiver was instructed to wear properly fitting socks with shoes at all times. Patient was also provided with verbal instruction regarding how to report any failures or malfunctions of shoes or inserts, and necessary follow up care. Patient / Caregiver was also instructed to contact physician regarding change in status that may affect function of shoes and inserts.  ? ?Patient / Caregiver verbalized undersatnding of instruction provided. Patient / Caregiver demonstrated independence with proper donning and doffing of shoes and inserts. ? ?Shoes ordered, Apex A3200W not appropriate due to elephantitis. New shoes to be ordered, [place] 728-E in size 17-14E to accomodate swelling and bandaging ? ?PLAN ?Patient to follow with treating physician as recommended. Plan of care was discussed with and agreed upon by patient and/or caregiver. All questions were answered and concerns addressed. ? ?

## 2021-06-14 ENCOUNTER — Encounter (HOSPITAL_BASED_OUTPATIENT_CLINIC_OR_DEPARTMENT_OTHER): Payer: Medicare HMO | Admitting: Internal Medicine

## 2021-06-14 DIAGNOSIS — I890 Lymphedema, not elsewhere classified: Secondary | ICD-10-CM

## 2021-06-14 DIAGNOSIS — L97822 Non-pressure chronic ulcer of other part of left lower leg with fat layer exposed: Secondary | ICD-10-CM

## 2021-06-14 DIAGNOSIS — L03116 Cellulitis of left lower limb: Secondary | ICD-10-CM | POA: Diagnosis not present

## 2021-06-14 NOTE — Progress Notes (Signed)
NEDRA, MCINNIS ([id number]) ?Visit Report for 06/14/2021 ?Chief Complaint Document Details ?Patient Name: Date of Service: ?Gervais, Janissa E. 06/14/2021 9:00 A M ?Medical Record Number: [id number] ?Patient Account Number: [id number] ?Date of Birth/Sex: Treating RN: ?11-02-1965 (56 y.o. F) Deaton, Bobbi ?Primary Care Provider: [name] Other Clinician: ?Referring Provider: ?Treating Provider/Extender: Kalman Shan ?[name] ?Weeks in Treatment: 2 ?Information Obtained from: Patient ?Chief Complaint ?05/26/2021; Left lower extremity wound ?Electronic Signature(s) ?Signed: 06/14/2021 1:49:49 PM By: Kalman Shan DO ?Entered By: Kalman Shan on 06/14/2021 10:58:59 ?-------------------------------------------------------------------------------- ?HPI Details ?Patient Name: Date of Service: ?Machi, Rosaisela E. 06/14/2021 9:00 A M ?Medical Record Number: [id number] ?Patient Account Number: [id number] ?Date of Birth/Sex: Treating RN: ?Apr 09, 1965 (56 y.o. F) Deaton, Bobbi ?Primary Care Provider: [name] Other Clinician: ?Referring Provider: ?Treating Provider/Extender: Kalman Shan ?[name] ?Weeks in Treatment: 2 ?History of Present Illness ?HPI Description: Admission 05/26/2021 ?Ms. Lulia Schriner is a 56 year old female with a past medical history of lymphedema That presents to the clinic for a 62-monthhistory of nonhealing ulcer to ?the posterior left leg. She has not been using any dressing to the wound bed. She has never experienced wounds before. She states that she sleeps in a ?recliner with her feet down. She has lymphedema pumps however has not been using them due to the wound and the increased swelling to her legs. She ?currently denies signs of infection. ?4/4; patient presents for follow-up. She states that the wrap slid down after a couple days. She states she tolerated it well prior to her coming loose. She denies ?signs of infection. ?4/11 comes in today with a  substantial wound on her left posterior calf. This is superficial in the setting of severe lymphedema. We have been using 3 layer ?compression. She had a PCR culture done last week which showed extremely high titers of Morganella but also high titers of Enterobacter and E. coli. [facility] ?The patient is not systemically unwell no fever no chills. Our intake nurse noted very significant odor however ?4/18; patient presents for follow-up. [facility] and [facility] today. She was started on ciprofloxacin at last clinic ?visit but states this caused her nausea and vomiting and she stopped. She denies systemic signs of infection. She tolerated the 4-layer compression wrap ?well. She has no issues or complaints today. ?Electronic Signature(s) ?Signed: 06/14/2021 1:49:49 PM By: HKalman ShanDO ?Entered By: HKalman Shanon 06/14/2021 10:59:33 ?-------------------------------------------------------------------------------- ?Physical Exam Details ?Patient Name: Date of Service: ?Litchford, Devetta E. 06/14/2021 9:00 A M ?Medical Record Number: [id number]?Patient Account Number: [id number]?Date of Birth/Sex: Treating RN: ?[birth year](56y.o. F) Deaton, Bobbi ?Primary Care Provider: FGeryl RankinsOther Clinician: ?Referring Provider: ?Treating Provider/Extender: HKalman Shan?FGeryl Rankins?Weeks in Treatment: 2 ?Constitutional ?respirations regular, non-labored and within target range for patient..Marland Kitchen?Cardiovascular ?2+ dorsalis pedis/posterior tibialis pulses. ?Psychiatric ?pleasant and cooperative. ?Notes ?Left lower extremity: T the posterior aspect there is an area of skin breakdown with weeping and nonviable tissue. Mild odor noted. Nonpitting edema to the ?o ?thigh ?Electronic Signature(s) ?Signed: 06/14/2021 1:49:49 PM By: HKalman ShanDO ?Entered By: HKalman Shanon 06/14/2021  11:01:07 ?-------------------------------------------------------------------------------- ?Physician Orders Details ?Patient Name: Date of Service: ?Gutter, Natha E. 06/14/2021 9:00 A M ?Medical Record Number: [id number]?Patient Account Number: [id number]?Date of Birth/Sex: Treating RN: ?121-May-1967(56y.o. F) Deaton, Bobbi ?Primary Care Provider: FGeryl RankinsOther Clinician: ?Referring Provider: ?Treating Provider/Extender: HKalman Shan?  [name] ?Weeks in Treatment: 2 ?Verbal / Phone Orders: No ?Diagnosis Coding ?ICD-10 Coding ?Code Description ?Y17.494 Non-pressure chronic ulcer of other part of left lower leg with fat layer exposed ?I89.0 Lymphedema, not elsewhere classified ?L03.116 Cellulitis of left lower limb ?Follow-up Appointments ?ppointment in 1 week. - with Dr. Heber [place] and [place], Room 8 06/21/2021 130pm ?Return A ?with Dr. Heber  and [place], Room 8 06/28/2021 130pm ?Other: - Bring in [employer] [facility]ing antibiotcis at each appt visit. ?Bathing/ Shower/ Hygiene ?May shower with protection but do not get wound dressing(s) wet. - Can get cast protector bag at [facility], [facility] or [place] ?Edema Control - Lymphedema / SCD / Other ?Lymphedema Pumps. Use Lymphedema pumps on leg(s) 2-3 times a day for 45-60 minutes. If wearing any wraps or hose, do not remove ?them. Continue exercising as instructed. - Use as directed ?Elevate legs to the level of the heart or above for 30 minutes daily and/or when sitting, a frequency of: - throughout the day ?Avoid standing for long periods of time. ?Moisturize legs daily. ?Wound Treatment ?Wound #1 - Lower Leg Wound Laterality: Left, Posterior ?Cleanser: Soap and Water 1 x Per Week/30 Days ?Discharge Instructions: May shower and wash wound with dial antibacterial soap and water prior to dressing change. ?Cleanser: Wound Cleanser 1 x Per Week/30 Days ?Discharge Instructions: Cleanse the wound with wound cleanser prior to applying a clean dressing using gauze  sponges, not tissue or cotton balls. ?Peri-Wound Care: Ketoconazole Cream 2% 1 x Per Week/30 Days ?Discharge Instructions: Apply Ketoconazole as directed ?Peri-Wound Care: Triamcinolone 15 (g) 1 x Per Week/30 Days ?Discharge Instructions: Use triamcinolone 15 (g) as directed ?Peri-Wound Care: Zinc Oxide Ointment 30g tube 1 x Per Week/30 Days ?Discharge Instructions: Apply Zinc Oxide to periwound with each dressing change ?Peri-Wound Care: Sween Lotion (Moisturizing lotion) 1 x Per Week/30 Days ?Discharge Instructions: Apply moisturizing lotion as directed ?Topical: [employer] topical antibiotics. 1 x Per Week/30 Days ?Discharge Instructions: apply directly to wound bed. ?Prim Dressing: KerraCel Ag Gelling Fiber Dressing, 4x5 in (silver alginate) 1 x Per Week/30 Days ?ary ?Discharge Instructions: Apply silver alginate to over [facility]. ?Secondary Dressing: ABD Pad, 5x9 1 x Per Week/30 Days ?Discharge Instructions: Apply over primary dressing as directed. ?Secondary Dressing: CarboFLEX Odor Control Dressing, 4x4 in 1 x Per Week/30 Days ?Discharge Instructions: Apply over primary dressing as directed. ?Secondary Dressing: Zetuvit Plus 4x8 in 1 x Per Week/30 Days ?Discharge Instructions: Apply over primary dressing as directed. ?Compression Wrap: FourPress (4 layer compression wrap) 1 x Per Week/30 Days ?Discharge Instructions: Apply four layer compression as directed. May also use [place] CoFlex 2 layer compression system as ?alternative. ?Electronic Signature(s) ?Signed: 06/14/2021 1:49:49 PM By: Kalman Shan DO ?Entered By: Kalman Shan on 06/14/2021 11:02:03 ?-------------------------------------------------------------------------------- ?Problem List Details ?Patient Name: ?Date of Service: ?Eischeid, Brittanyann E. 06/14/2021 9:00 A M ?Medical Record Number: [id number] ?Patient Account Number: [id number] ?Date of Birth/Sex: ?Treating RN: ?03/29/1965 (56 y.o. F) Deaton, Bobbi ?Primary Care Provider:  [name] ?Other Clinician: ?Referring Provider: ?Treating Provider/Extender: Kalman Shan ?[name] ?Weeks in Treatment: 2 ?Active Problems ?ICD-10 ?Encounter ?Code Description Active Date MDM ?Diagnosis ?W46.659

## 2021-06-14 NOTE — Progress Notes (Signed)
MINAHIL, QUINLIVAN ([id number]) ?Visit Report for 06/14/2021 ?Allergy List Details ?Patient Name: Date of Service: ?[name], Lauren E. 06/14/2021 9:00 A M ?Medical Record Number: [id number] ?Patient Account Number: [id number] ?Date of Birth/Sex: Treating RN: ?13-Jul-1965 (56 y.o. F) Deaton, Bobbi ?Primary Care Alvine Mostafa: [name] Other Clinician: ?Referring Bryson Palen: ?Treating Blakelee Allington/Extender: Kalman Shan ?[name] ?Weeks in Treatment: 2 ?Allergies ?Active Allergies ?abatacept ?Reaction: Anaphylaxis ?Severity: Severe ?bee venom protein (honey bee) ?Reaction: Anaphylaxis ?Severity: Severe ?latex ?Augmentin ?Reaction: Hives, Itching ?Nulytely ?Reaction: N/V ?Cipro ?Reaction: N/V ?Allergy Notes ?Electronic Signature(s) ?Signed: 06/14/2021 5:03:45 PM By: Deon Pilling RN, BSN ?Entered By: Deon Pilling on 06/14/2021 09:20:23 ?-------------------------------------------------------------------------------- ?Arrival Information Details ?Patient Name: Date of Service: ?[name], Lauren E. 06/14/2021 9:00 A M ?Medical Record Number: [id number] ?Patient Account Number: [id number] ?Date of Birth/Sex: Treating RN: ?08/13/1965 (56 y.o. F) Deaton, Bobbi ?Primary Care Fahmida Jurich: [name] Other Clinician: ?Referring Badr Piedra: ?Treating Malita Ignasiak/Extender: Kalman Shan ?[name] ?Weeks in Treatment: 2 ?Visit Information ?Patient Arrived: [name] ?Arrival Time: 09:10 ?Accompanied By: self ?Transfer Assistance: None ?Patient Identification Verified: Yes ?Secondary Verification Process Completed: Yes ?Patient Requires Transmission-Based Precautions: No ?Patient Has Alerts: Yes ?Patient Alerts: Patient on Blood Thinner ?Patient Alerts: Patient on Blood Thinner si ?Ha ?Ha ?Pa ?History Since Last Visit ?Added or deleted any medications: No ?Any new allergies or adverse reactions: No ?Had a fall or experienced change in activities of daily living that may affect risk of falls: No ?Signs or symptoms of abuse/neglect  since last visito No ?Hospitalized since last visit: No ?Implantable device outside of the clinic excluding cellular tissue based products placed in the center nce last visit: No ?s Dressing in Place as Prescribed: Yes ?s Compression in Place as Prescribed: Yes ?Electronic Signature(s) ?Signed: 06/14/2021 5:03:45 PM By: Deon Pilling RN, BSN ?Entered By: Deon Pilling on 06/14/2021 09:12:06 ?-------------------------------------------------------------------------------- ?Compression Therapy Details ?Patient Name: ?Date of Service: ?[name], Lauren E. 06/14/2021 9:00 A M ?Medical Record Number: [id number] ?Patient Account Number: [id number] ?Date of Birth/Sex: ?Treating RN: ?1965-06-04 (56 y.o. F) Deaton, Bobbi ?Primary Care Sharalee Witman: [name] ?Other Clinician: ?Referring Zhamir Pirro: ?Treating Kaydynce Pat/Extender: Kalman Shan ?[name] ?Weeks in Treatment: 2 ?Compression Therapy Performed for Wound Assessment: Wound #1 Left,Posterior Lower Leg ?Performed By: Clinician Deon Pilling, RN ?Compression Type: Four Layer ?Post Procedure Diagnosis ?Same as Pre-procedure ?Electronic Signature(s) ?Signed: 06/14/2021 5:03:45 PM By: Deon Pilling RN, BSN ?Entered By: Deon Pilling on 06/14/2021 09:44:06 ?-------------------------------------------------------------------------------- ?Encounter Discharge Information Details ?Patient Name: ?Date of Service: ?[name], Lauren E. 06/14/2021 9:00 A M ?Medical Record Number: [id number] ?Patient Account Number: [id number] ?Date of Birth/Sex: ?Treating RN: ?Feb 24, 1966 (56 y.o. F) Deaton, Bobbi ?Primary Care Timithy Arons: [name] ?Other Clinician: ?Referring Romani Wilbon: ?Treating Jesscia Imm/Extender: Kalman Shan ?[name] ?Weeks in Treatment: 2 ?Encounter Discharge Information Items ?Discharge Condition: Stable ?Ambulatory Status: [name] ?Discharge Destination: Home ?Transportation: Private Auto ?Accompanied By: self ?Schedule Follow-up Appointment: Yes ?Clinical  Summary of Care: ?Electronic Signature(s) ?Signed: 06/14/2021 5:03:45 PM By: Deon Pilling RN, BSN ?Entered By: Deon Pilling on 06/14/2021 09:47:59 ?-------------------------------------------------------------------------------- ?Lower Extremity Assessment Details ?Patient Name: ?Date of Service: ?[name], Lauren E. 06/14/2021 9:00 A M ?Medical Record Number: [id number] ?Patient Account Number: [id number] ?Date of Birth/Sex: ?Treating RN: ?13-Jun-1965 (56 y.o. F) Deaton, Bobbi ?Primary Care Nayra Coury: [name] ?Other Clinician: ?Referring Tanessa Tidd: ?Treating Kanoelani Dobies/Extender: Kalman Shan ?[name] ?Weeks in Treatment: 2 ?Edema Assessment ?Assessed: [Left: Yes] [Right: No] ?Edema: [Left: Ye] [Right: s] ?Calf ?Left: Right: ?Point of Measurement: 33 cm From Medial Instep 70.5 cm ?Ankle ?Left: Right: ?Point of Measurement: 12 cm  From Medial Instep 54 cm ?Vascular Assessment ?Pulses: ?Dorsalis Pedis ?Palpable: [Left:No] ?Electronic Signature(s) ?Signed: 06/14/2021 5:03:45 PM By: Deon Pilling RN, BSN ?Entered By: Deon Pilling on 06/14/2021 09:15:12 ?-------------------------------------------------------------------------------- ?Multi Wound Chart Details ?Patient Name: ?Date of Service: ?[name], Lauren E. 06/14/2021 9:00 A M ?Medical Record Number: [id number] ?Patient Account Number: [id number] ?Date of Birth/Sex: ?Treating RN: ?March 19, 1965 (56 y.o. F) Deaton, Bobbi ?Primary Care Gracious Renken: [name] ?Other Clinician: ?Referring Fayetta Sorenson: ?Treating Jalacia Mattila/Extender: Kalman Shan ?[name] ?Weeks in Treatment: 2 ?Vital Signs ?Height(in): 72 ?Pulse(bpm): 90 ?Weight(lbs): 359 ?Blood Pressure(mmHg): 147/81 ?Body Mass Index(BMI): 48.7 ?Temperature(??F): 98 ?Respiratory Rate(breaths/min): 20 ?Photos: [1:Left, Posterior Lower Leg] [N/A:N/A N/A] ?Wound Location: [1:Gradually Appeared] [N/A:N/A] ?Wounding Event: [1:Lymphedema] [N/A:N/A] ?Primary Etiology: [1:Cataracts, Anemia, Lymphedema,]  [N/A:N/A] ?Comorbid History: [1:Asthma, Deep Vein Thrombosis, Hypertension, Osteoarthritis 02/25/2021] [N/A:N/A] ?Date Acquired: [1:2] [N/A:N/A] ?Weeks of Treatment: [1:Open] [N/A:N/A] ?Wound Status: [1:No] [N/A:N/A] ?Wound Recurrence: [1:6.5x13x0.1] [N/A:N/A] ?Measurements L x W x D (cm) [1:66.366] [N/A:N/A] ?A (cm?) : ?rea [1:6.637] [N/A:N/A] ?Volume (cm?) : [1:15.50%] [N/A:N/A] ?% Reduction in Area: [1:15.50%] [N/A:N/A] ?% Reduction in Volume: [1:Full Thickness Without Exposed] [N/A:N/A] ?Classification: [1:Support Structures Medium] [N/A:N/A] ?Exudate Amount: [1:Serosanguineous] [N/A:N/A] ?Exudate Type: [1:red, brown] [N/A:N/A] ?Exudate Color: [1:Yes] [N/A:N/A] ?Foul Odor A Cleansing: [1:fter No] [N/A:N/A] ?Odor Anticipated Due to Product ?Use: [1:Distinct, outline attached] [N/A:N/A] ?Wound Margin: [1:Medium (34-66%)] [N/A:N/A] ?Granulation A mount: [1:Red, Pink] [N/A:N/A] ?Granulation Quality: [1:Medium (34-66%)] [N/A:N/A] ?Necrotic Amount: ?[1:Fat Layer (Subcutaneous Tissue): Yes N/A] ?Exposed Structures: ?[1:Fascia: No Tendon: No Muscle: No Joint: No Bone: No Medium (34-66%)] [N/A:N/A] ?Epithelialization: [1:Compression Therapy] [N/A:N/A] ?Treatment Notes ?Wound #1 (Lower Leg) Wound Laterality: Left, Posterior ?Cleanser ?Soap and Water ?Discharge Instruction: May shower and wash wound with dial antibacterial soap and water prior to dressing change. ?Wound Cleanser ?Discharge Instruction: Cleanse the wound with wound cleanser prior to applying a clean dressing using gauze sponges, not tissue or cotton balls. ?Peri-Wound Care ?Ketoconazole Cream 2% ?Discharge Instruction: Apply Ketoconazole as directed ?Triamcinolone 15 (g) ?Discharge Instruction: Use triamcinolone 15 (g) as directed ?Zinc Oxide Ointment 30g tube ?Discharge Instruction: Apply Zinc Oxide to periwound with each dressing change ?Sween Lotion (Moisturizing lotion) ?Discharge Instruction: Apply moisturizing lotion as directed ?Topical ?keystone  compounding topical antibiotics. ?Discharge Instruction: apply directly to wound bed. ?Primary Dressing ?KerraCel Ag Gelling Fiber Dressing, 4x5 in (silver alginate) ?Discharge Instruction: Apply silver alginate to over

## 2021-06-16 ENCOUNTER — Telehealth: Payer: Self-pay

## 2021-06-16 NOTE — Telephone Encounter (Signed)
Called patient to let her know we have found a larger shoe that is stretch material, however it is back-ordered for 8-10 weeks. Patient is happy to wait for a shoe with a better fit and looks forward to being contacted when they are ready, regardless of how long it takes. All questions answered and concerns addressed. ?

## 2021-06-20 NOTE — Progress Notes (Signed)
?  Subjective:  ?Patient ID: [name], female    DOB: [birth year],  MRN: [id number] ? ?[name] presents to clinic today for at risk foot care. Patient has history of chronic lymphedema b/l LE ? ?Patient states she has developed a wound on her LLE and is seeing wound care. She is having a problem keeping her left shoe on. She is also seeing Pedorthist today for custom shoes. ? ?PCP is Gildardo Pounds, NP , and last visit was January 14, 2021. ? ?Allergies  ?Allergen Reactions  ? Abatacept Anaphylaxis  ?  Other reaction(s): Anaphylaxis-Streptomycin ?Clickject ?Other reaction(s): Anaphylaxis-Streptomycin  ? Bee Venom Anaphylaxis  ? Augmentin [Amoxicillin-Pot Clavulanate] Hives, Itching and Other (See Comments)  ?  Did PCN reaction causing immediate rash, facial/tongue/throat swelling, SOB or lightheadedness with hypotension: yes ?Did PCN reaction causing severe rash involving mucus membranes or skin necrosis: no ?Has patient had a PCN reaction that required hospitalization: in hospital ?Has patient had a PCN reaction occurring within the last 10 years: no ?If all of the above answers are "NO", then may proceed with Cephalosporin use. ? ?Pt reports no recollection of any reactions when taking penicillin in past  ? Latex Hives and Other (See Comments)  ?  Local reaction (welts). Patient denies any wheezing or other reaction with latex exposure  ? Nulytely [Peg 3350-Kcl-Na Bicarb-Nacl]   ?  NAUSEA AND VOMITING. MAY TOLERATE LOW VOLUME PREP.  ? ? ?Review of Systems: Negative except as noted in the HPI. ? ?Objective: No changes noted in today's physical examination. ? ?Constitutional [name] is a pleasant 56 y.o. African American female, morbidly obese in NAD. AAO x 3.   ?Vascular Capillary refill time to digits immediate b/l. Palpable pedal pulses b/l LE. Pedal hair absent. No pain with calf compression b/l. Lymphedema present BLE. No ischemia or gangrene noted b/l LE. No cyanosis or clubbing  noted b/l LE.  ?Neurologic Normal speech. Oriented to person, place, and time. Protective sensation intact 5/5 intact bilaterally with 10g monofilament b/l. Vibratory sensation intact b/l.  ?Dermatologic Leg wrap LLE is clean, dry and intact. No interdigital macerations noted b/l LE. Toenails 1-5 b/l elongated, discolored, dystrophic, thickened, crumbly with subungual debris and tenderness to dorsal palpation. Skin b/l lower extremities noted to be thickened and brawny consistent with lymphedema.  ?Orthopedic: Muscle strength 5/5 to all lower extremity muscle groups bilaterally. Sausage digits 1-5 b/l. Utilizes rollator for ambulation assistance.  ? ?Radiographs: None ? ? ?  Latest Ref Rng & Units 05/05/2021  ?  2:53 PM 10/12/2020  ?  2:11 PM 07/12/2020  ?  4:28 PM  ?Hemoglobin A1C  ?Hemoglobin-A1c 4.8 - 5.6 % 5.5   6.0   6.4    ? ?Assessment/Plan: ?1. Pain due to onychomycosis of toenails of both feet   ?  ?-Patient was evaluated and treated. All patient's and/or POA's questions/concerns answered on today's visit. ?-Patient currently under care of Dr. Heber Martin in wound care for wound of LLE. ?-Toenails 1-5 b/l were debrided in length and girth with sterile nail nippers and dremel without iatrogenic bleeding.  ?-Patient/POA to call should there be question/concern in the interim.  ? ?Return in about 3 months (around 09/12/2021). ? ?Marzetta Board, DPM  ?

## 2021-06-21 ENCOUNTER — Encounter (HOSPITAL_BASED_OUTPATIENT_CLINIC_OR_DEPARTMENT_OTHER): Payer: Medicare HMO | Admitting: Internal Medicine

## 2021-06-21 DIAGNOSIS — L97812 Non-pressure chronic ulcer of other part of right lower leg with fat layer exposed: Secondary | ICD-10-CM | POA: Diagnosis not present

## 2021-06-21 DIAGNOSIS — I890 Lymphedema, not elsewhere classified: Secondary | ICD-10-CM

## 2021-06-21 DIAGNOSIS — L03116 Cellulitis of left lower limb: Secondary | ICD-10-CM

## 2021-06-21 DIAGNOSIS — L97822 Non-pressure chronic ulcer of other part of left lower leg with fat layer exposed: Secondary | ICD-10-CM

## 2021-06-21 NOTE — Progress Notes (Signed)
DARIA, MCMEEKIN ([id number]) ?Visit Report for 06/21/2021 ?Fall Risk Assessment Details ?Patient Name: Date of Service: ?[name], Lauren E. 06/21/2021 1:30 PM ?Medical Record Number: [id number] ?Patient Account Number: [id number] ?Date of Birth/Sex: Treating RN: ?11/06/1965 (55 y.o. F) [name], [name] ?Primary Care Masato Pettie: [name] Other Clinician: ?Referring Beckett Maden: ?Treating Jakhi Dishman/Extender: Kalman Shan ?[name] ?Weeks in Treatment: 3 ?Fall Risk Assessment Items ?Have you had 2 or more falls in the last 12 monthso 0 Yes ?Have you had any fall that resulted in injury in the last 12 monthso 0 No ?FALLS RISK SCREEN ?History of falling - immediate or within 3 months 0 No ?Secondary diagnosis (Do you have 2 or more medical diagnoseso) 0 No ?Ambulatory aid ?None/bed rest/wheelchair/nurse 0 No ?Crutches/cane/walker 15 Yes ?Furniture 0 No ?Intravenous therapy Access/Saline/Heparin Lock 0 No ?Gait/Transferring ?Normal/ bed rest/ wheelchair 0 No ?Weak (short steps with or without shuffle, stooped but able to lift head while walking, may seek 0 No ?support from furniture) ?Impaired (short steps with shuffle, may have difficulty arising from chair, head down, impaired 0 No ?balance) ?Mental Status ?Oriented to own ability 0 No ?Notes ?per patient fell twice over the weekend, dizzy spells related to medication. Per patient going to speak with PCP about adjusting medications to prevent ?dizziness. ?Electronic Signature(s) ?Signed: 06/21/2021 5:13:01 PM By: Deon Pilling RN, BSN ?Entered By: Deon Pilling on 06/21/2021 14:52:06 ?

## 2021-06-21 NOTE — Progress Notes (Signed)
Lauren, [name] ([id number]) ?Visit Report for 06/21/2021 ?Chief Complaint Document Details ?Patient Name: Date of Service: ?Lauren, [name] 06/21/2021 1:30 PM ?Medical Record Number: [id number] ?Patient Account Number: [id number] ?Date of Birth/Sex: Treating RN: ?1966-01-14 (56 y.o. F) Deaton, Bobbi ?Primary Care Provider: [name] Other Clinician: ?Referring Provider: ?Treating Provider/Extender: Kalman Shan ?[name] ?Weeks in Treatment: 3 ?Information Obtained from: Patient ?Chief Complaint ?05/26/2021; Left lower extremity wound ?Electronic Signature(s) ?Signed: 06/21/2021 4:28:23 PM By: Kalman Shan DO ?Entered By: Kalman Shan on 06/21/2021 15:02:45 ?-------------------------------------------------------------------------------- ?HPI Details ?Patient Name: Date of Service: ?[name], [name] E. 06/21/2021 1:30 PM ?Medical Record Number: [id number] ?Patient Account Number: [id number] ?Date of Birth/Sex: Treating RN: ?[birth year] (56 y.o. F) Deaton, Bobbi ?Primary Care Provider: [name] Other Clinician: ?Referring Provider: ?Treating Provider/Extender: Kalman Shan ?[name] ?Weeks in Treatment: 3 ?History of Present Illness ?HPI Description: Admission 05/26/2021 ?Ms. [name] is a 56 year old female with a past medical history of lymphedema That presents to the clinic for a 17-monthhistory of nonhealing ulcer to ?the posterior left leg. She has not been using any dressing to the wound bed. She has never experienced wounds before. She states that she sleeps in a ?recliner with her feet down. She has lymphedema pumps however has not been using them due to the wound and the increased swelling to her legs. She ?currently denies signs of infection. ?4/4; patient presents for follow-up. She states that the wrap slid down after a couple days. She states she tolerated it well prior to her coming loose. She denies ?signs of infection. ?4/11 comes in today with a substantial  wound on her left posterior calf. This is superficial in the setting of severe lymphedema. We have been using 3 layer ?compression. She had a PCR culture done last week which showed extremely high titers of Morganella but also high titers of Enterobacter and E. coli. [facility] ?The patient is not systemically unwell no fever no chills. Our intake nurse noted very significant odor however ?4/18; patient presents for follow-up. [facility] and [facility] this in today. She was started on ciprofloxacin at last clinic ?visit but states this caused her nausea and vomiting and she stopped. She denies systemic signs of infection. She tolerated the 4-layer compression wrap ?well. She has no issues or complaints today. ?4/25; patient presents for follow-up. She has been tolerating the 4-layer compression wrap well to the left lower extremity. Unfortunately she has a new wound ?to the right lower extremity. ?Electronic Signature(s) ?Signed: 06/21/2021 4:28:23 PM By: HKalman ShanDO ?Entered By: HKalman Shanon 06/21/2021 15:03:23 ?-------------------------------------------------------------------------------- ?Physical Exam Details ?Patient Name: Date of Service: ?[name], [name]. 06/21/2021 1:30 PM ?Medical Record Number: [id number]?Patient Account Number: [id number]?Date of Birth/Sex: Treating RN: ?[birth year](56y.o. F) Deaton, Bobbi ?Primary Care Provider: FGeryl RankinsOther Clinician: ?Referring Provider: ?Treating Provider/Extender: HKalman Shan?FGeryl Rankins?Weeks in Treatment: 3 ?Constitutional ?respirations regular, non-labored and within target range for patient..Marland Kitchen?Psychiatric ?pleasant and cooperative. ?Notes ?Left lower extremity: T the posterior aspect there is a small area of skin breakdown with weeping. No signs of surrounding infection. ?o ?Right lower extremity: T the posterior  aspect there is an open wound with granulation tissue throughout. No signs of surrounding infection. ?o ?Electronic Signature(s) ?Signed: 06/21/2021 4:28:23 PM By: HKalman ShanDO ?Entered By: HKalman Shanon 06/21/2021 15:04:05 ?-------------------------------------------------------------------------------- ?Physician Orders Details ?Patient Name: Date of Service: ?[name],  Lauren E. 06/21/2021 1:30 PM ?Medical Record Number: [id number] ?Patient Account Number: [id number] ?Date of Birth/Sex: Treating RN: ?[birth year] (55 y.o. F) Deaton, Bobbi ?Primary Care Provider: [name] Other Clinician: ?Referring Provider: ?Treating Provider/Extender: Kalman Shan ?[name] ?Weeks in Treatment: 3 ?Verbal / Phone Orders: No ?Diagnosis Coding ?ICD-10 Coding ?Code Description ?U54.270 Non-pressure chronic ulcer of other part of left lower leg with fat layer exposed ?I89.0 Lymphedema, not elsewhere classified ?L03.116 Cellulitis of left lower limb ?Follow-up Appointments ?ppointment in 1 week. - with Dr. Heber [place] and [place], Room 8 06/28/2021 130pm ?Return A ?Dr. Heber [place] and [place], Room 8 07/05/2021 130pm ?Other: - Bring in [place] compounding antibiotcis at each appt visit. ?Bathing/ Shower/ Hygiene ?May shower with protection but do not get wound dressing(s) wet. - Can get cast protector bag at [facility], [facility] or [facility] ?Edema Control - Lymphedema / SCD / Other ?Lymphedema Pumps. Use Lymphedema pumps on leg(s) 2-3 times a day for 45-60 minutes. If wearing any wraps or hose, do not remove ?them. Continue exercising as instructed. - Use as directed ?Elevate legs to the level of the heart or above for 30 minutes daily and/or when sitting, a frequency of: - throughout the day ?Avoid standing for long periods of time. ?Moisturize legs daily. ?Wound Treatment ?Wound #1 - Lower Leg Wound Laterality: Left, Posterior ?Cleanser: Soap and Water 1 x Per Week/30 Days ?Discharge Instructions: May shower and wash wound with  dial antibacterial soap and water prior to dressing change. ?Cleanser: Wound Cleanser 1 x Per Week/30 Days ?Discharge Instructions: Cleanse the wound with wound cleanser prior to applying a clean dressing using gauze sponges, not tissue or cotton balls. ?Peri-Wound Care: Sween Lotion (Moisturizing lotion) 1 x Per Week/30 Days ?Discharge Instructions: Apply moisturizing lotion as directed ?Topical: [place]stone compounding topical antibiotics. 1 x Per Week/30 Days ?Discharge Instructions: apply directly to wound bed. ?Prim Dressing: KerraCel Ag Gelling Fiber Dressing, 4x5 in (silver alginate) 1 x Per Week/30 Days ?ary ?Discharge Instructions: Apply silver alginate to over keystone. ?Secondary Dressing: ABD Pad, 5x9 1 x Per Week/30 Days ?Discharge Instructions: Apply over primary dressing as directed. ?Secondary Dressing: CarboFLEX Odor Control Dressing, 4x4 in 1 x Per Week/30 Days ?Discharge Instructions: Apply over primary dressing as directed. ?Secondary Dressing: Zetuvit Plus 4x8 in 1 x Per Week/30 Days ?Discharge Instructions: Apply over primary dressing as directed. ?Compression Wrap: FourPress (4 layer compression wrap) 1 x Per Week/30 Days ?Discharge Instructions: Apply four layer compression as directed. May also use [place] CoFlex 2 layer compression system as ?alternative. ?Wound #2 - Lower Leg Wound Laterality: Right, Posterior ?Cleanser: Soap and Water 1 x Per Week/30 Days ?Discharge Instructions: May shower and wash wound with dial antibacterial soap and water prior to dressing change. ?Cleanser: Wound Cleanser 1 x Per Week/30 Days ?Discharge Instructions: Cleanse the wound with wound cleanser prior to applying a clean dressing using gauze sponges, not tissue or cotton balls. ?Peri-Wound Care: Sween Lotion (Moisturizing lotion) 1 x Per Week/30 Days ?Discharge Instructions: Apply moisturizing lotion as directed ?Prim Dressing: KerraCel Ag Gelling Fiber Dressing, 4x5 in (silver alginate) 1 x Per Week/30  Days ?ary ?Discharge Instructions: Apply silver alginate to over keystone. ?Secondary Dressing: ABD Pad, 5x9 1 x Per Week/30 Days ?Discharge Instructions: Apply over primary dressing as directed. ?Secondary Josephine Cables

## 2021-06-24 NOTE — Progress Notes (Signed)
NAVIA, LINDAHL ([id number]) ?Visit Report for 06/21/2021 ?Arrival Information Details ?Patient Name: Date of Service: ?ELIANY, MCCARTER E. 06/21/2021 1:30 PM ?Medical Record Number: [id number] ?Patient Account Number: [id number] ?Date of Birth/Sex: Treating RN: ?[birth year] (56 y.o. F) Deaton, Bobbi ?Primary Care Ivy Puryear: [name] Other Clinician: ?Referring Zhavia Cunanan: ?Treating Genisis Sonnier/Extender: Kalman Shan ?[name] ?Weeks in Treatment: 3 ?Visit Information History Since Last Visit ?All ordered tests and consults were completed: Yes ?Patient Arrived: [name] ?Added or deleted any medications: No ?Arrival Time: 14:27 ?Any new allergies or adverse reactions: No ?Accompanied By: self ?Had a fall or experienced change in Yes ?Transfer Assistance: None ?activities of daily living that may affect ?Patient Identification Verified: Yes ?risk of falls: ?Secondary Verification Process Completed: Yes ?Signs or symptoms of abuse/neglect since last visito No ?Patient Requires Transmission-Based Precautions: No ?Hospitalized since last visit: No ?Patient Has Alerts: Yes ?Implantable device outside of the clinic excluding No ?Patient Alerts: Patient on Blood Thinner cellular tissue based products placed in the center ?since last visit: ?Has Dressing in Place as Prescribed: Yes ?Pain Present Now: Yes ?Electronic Signature(s) ?Signed: 06/24/2021 9:12:08 AM By: Sandre Kitty ?Entered By: Sandre Kitty on 06/21/2021 14:30:00 ?-------------------------------------------------------------------------------- ?Compression Therapy Details ?Patient Name: Date of Service: ?ERMEL, VERNE E. 06/21/2021 1:30 PM ?Medical Record Number: [id number] ?Patient Account Number: [id number] ?Date of Birth/Sex: Treating RN: ?[birth year] (56 y.o. F) Deaton, Bobbi ?Primary Care Square Jowett: [name] Other Clinician: ?Referring Tiburcio Linder: ?Treating Beula Joyner/Extender: Kalman Shan ?[name] ?Weeks in Treatment: 3 ?Compression  Therapy Performed for Wound Assessment: Wound #1 Left,Posterior Lower Leg ?Performed By: Clinician Deon Pilling, RN ?Compression Type: Four Layer ?Post Procedure Diagnosis ?Same as Pre-procedure ?Electronic Signature(s) ?Signed: 06/21/2021 5:13:01 PM By: Deon Pilling RN, BSN ?Entered By: Deon Pilling on 06/21/2021 15:02:33 ?-------------------------------------------------------------------------------- ?Compression Therapy Details ?Patient Name: ?Date of Service: ?VALORIA, TAMBURRI E. 06/21/2021 1:30 PM ?Medical Record Number: [id number] ?Patient Account Number: [id number] ?Date of Birth/Sex: ?Treating RN: ?19-Nov-1965 (56 y.o. F) Deaton, Bobbi ?Primary Care Lucille Crichlow: [name] ?Other Clinician: ?Referring Giuseppe Duchemin: ?Treating Deovion Batrez/Extender: Kalman Shan ?[name] ?Weeks in Treatment: 3 ?Compression Therapy Performed for Wound Assessment: Wound #2 Right,Posterior Lower Leg ?Performed By: Clinician Deon Pilling, RN ?Compression Type: Four Layer ?Post Procedure Diagnosis ?Same as Pre-procedure ?Electronic Signature(s) ?Signed: 06/21/2021 5:13:01 PM By: Deon Pilling RN, BSN ?Entered By: Deon Pilling on 06/21/2021 15:02:33 ?-------------------------------------------------------------------------------- ?Encounter Discharge Information Details ?Patient Name: ?Date of Service: ?LILLI, DEWALD E. 06/21/2021 1:30 PM ?Medical Record Number: [id number] ?Patient Account Number: [id number] ?Date of Birth/Sex: ?Treating RN: ?1966/01/20 (56 y.o. F) Deaton, Bobbi ?Primary Care Zakaria Sedor: [name] ?Other Clinician: ?Referring Narissa Beaufort: ?Treating Kaleb Sek/Extender: Kalman Shan ?[name] ?Weeks in Treatment: 3 ?Encounter Discharge Information Items ?Discharge Condition: Stable ?Ambulatory Status: [name] ?Discharge Destination: Home ?Transportation: Private Auto ?Accompanied By: self ?Schedule Follow-up Appointment: Yes ?Clinical Summary of Care: ?Electronic Signature(s) ?Signed: 06/21/2021 5:13:01 PM  By: Deon Pilling RN, BSN ?Entered By: Deon Pilling on 06/21/2021 15:06:19 ?-------------------------------------------------------------------------------- ?Lower Extremity Assessment Details ?Patient Name: ?Date of Service: ?NYAJA, DUBUQUE E. 06/21/2021 1:30 PM ?Medical Record Number: [id number] ?Patient Account Number: [id number] ?Date of Birth/Sex: ?Treating RN: ?[birth year] (56 y.o. F) Deaton, Bobbi ?Primary Care Narely Nobles: [name] ?Other Clinician: ?Referring Isa Hitz: ?Treating Brison Fiumara/Extender: Kalman Shan ?[name] ?Weeks in Treatment: 3 ?Edema Assessment ?Assessed: [Left: Yes] [Right: Yes] ?Edema: [Left: Yes] [Right: Yes] ?Calf ?Left: Right: ?Point of Measurement: 33 cm From Medial Instep 66 cm ?Ankle ?Left: Right: ?Point of Measurement: 12 cm From Medial Instep 56 cm ?Electronic Signature(s) ?Signed: 06/21/2021 5:13:01 PM By: Deon Pilling RN,  BSN ?Entered By: Deon Pilling on 06/21/2021 14:49:18 ?-------------------------------------------------------------------------------- ?Multi Wound Chart Details ?Patient Name: ?Date of Service: ?DAJE, STARK E. 06/21/2021 1:30 PM ?Medical Record Number: [id number] ?Patient Account Number: [id number] ?Date of Birth/Sex: ?Treating RN: ?1965-10-11 (56 y.o. F) Deaton, Bobbi ?Primary Care Blake Goya: [name] ?Other Clinician: ?Referring Kaula Klenke: ?Treating Hamda Klutts/Extender: Kalman Shan ?[name] ?Weeks in Treatment: 3 ?Vital Signs ?Height(in): 72 ?Pulse(bpm): 96 ?Weight(lbs): 359 ?Blood Pressure(mmHg): 149/82 ?Body Mass Index(BMI): 48.7 ?Temperature(??F): 98.1 ?Respiratory Rate(breaths/min): 19 ?Photos: [N/A:N/A] ?Left, Posterior Lower Leg Right, Posterior Lower Leg N/A ?Wound Location: ?Gradually Appeared Gradually Appeared N/A ?Wounding Event: ?Lymphedema Lymphedema N/A ?Primary Etiology: ?Cataracts, Anemia, Lymphedema, Cataracts, Anemia, Lymphedema, N/A ?Comorbid History: ?Asthma, Deep Vein Thrombosis, Asthma, Deep Vein  Thrombosis, ?Hypertension, Osteoarthritis Hypertension, Osteoarthritis ?02/25/2021 06/07/2021 N/A ?Date Acquired: ?3 0 N/A ?Weeks of Treatment: ?Open Open N/A ?Wound Status: ?No No N/A ?Wound Recurrence: ?4x7.5x0.1 0.6x1.5x0.1 N/A ?Measurements L x W x D (cm) ?23.562 0.707 N/A ?A (cm?) : ?rea ?2.356 0.071 N/A ?Volume (cm?) : ?70.00% 0.00% N/A ?% Reduction in Area: ?70.00% 0.00% N/A ?% Reduction in Volume: ?Full Thickness Without Exposed Full Thickness Without Exposed N/A ?Classification: ?Support Structures Support Structures ?Medium Medium N/A ?Exudate Amount: ?Serosanguineous Serosanguineous N/A ?Exudate Type: ?red, brown red, brown N/A ?Exudate Color: ?Distinct, outline attached Distinct, outline attached N/A ?Wound Margin: ?Large (67-100%) Large (67-100%) N/A ?Granulation Amount: ?Red, Pink Red, Pink N/A ?Granulation Quality: ?Small (1-33%) None Present (0%) N/A ?Necrotic Amount: ?Fat Layer (Subcutaneous Tissue): Yes Fat Layer (Subcutaneous Tissue): Yes N/A ?Exposed Structures: ?Fascia: No ?Fascia: No ?Tendon: No ?Tendon: No ?Muscle: No ?Muscle: No ?Joint: No ?Joint: No ?Bone: No ?Bone: No ?Large (67-100%) Small (1-33%) N/A ?Epithelialization: ?Treatment Notes ?Electronic Signature(s) ?Signed: 06/21/2021 4:28:23 PM By: Kalman Shan DO ?Signed: 06/21/2021 5:13:01 PM By: Deon Pilling RN, BSN ?Entered By: Kalman Shan on 06/21/2021 15:02:26 ?-------------------------------------------------------------------------------- ?Multi-Disciplinary Care Plan Details ?Patient Name: ?Date of Service: ?OLUWATOSIN, BRACY E. 06/21/2021 1:30 PM ?Medical Record Number: [id number] ?Patient Account Number: [id number] ?Date of Birth/Sex: ?Treating RN: ?15-Dec-1965 (56 y.o. F) Deaton, Bobbi ?Primary Care Sherline Eberwein: [name] ?Other Clinician: ?Referring Harumi Yamin: ?Treating Averyanna Sax/Extender: Kalman Shan ?[name] ?Weeks in Treatment: 3 ?Active Inactive ?Venous Leg Ulcer ?Nursing Diagnoses: ?Actual venous Insuffiency  (use after diagnosis is confirmed) ?Goals: ?Patient will maintain optimal edema control ?Date Initiated: 05/26/2021 ?Target Resolution Date: 08/26/2021 ?Goal Status: Active ?Interventions: ?Assess peripheral edema

## 2021-06-28 ENCOUNTER — Encounter (HOSPITAL_BASED_OUTPATIENT_CLINIC_OR_DEPARTMENT_OTHER): Payer: Medicare HMO | Attending: Internal Medicine | Admitting: Internal Medicine

## 2021-06-28 DIAGNOSIS — I890 Lymphedema, not elsewhere classified: Secondary | ICD-10-CM | POA: Diagnosis not present

## 2021-06-28 DIAGNOSIS — L03116 Cellulitis of left lower limb: Secondary | ICD-10-CM | POA: Insufficient documentation

## 2021-06-28 DIAGNOSIS — L97812 Non-pressure chronic ulcer of other part of right lower leg with fat layer exposed: Secondary | ICD-10-CM | POA: Insufficient documentation

## 2021-06-28 DIAGNOSIS — L97822 Non-pressure chronic ulcer of other part of left lower leg with fat layer exposed: Secondary | ICD-10-CM | POA: Diagnosis not present

## 2021-07-05 ENCOUNTER — Encounter (HOSPITAL_BASED_OUTPATIENT_CLINIC_OR_DEPARTMENT_OTHER): Payer: Medicare HMO | Admitting: Internal Medicine

## 2021-07-05 DIAGNOSIS — I890 Lymphedema, not elsewhere classified: Secondary | ICD-10-CM | POA: Diagnosis not present

## 2021-07-05 DIAGNOSIS — L97822 Non-pressure chronic ulcer of other part of left lower leg with fat layer exposed: Secondary | ICD-10-CM | POA: Diagnosis not present

## 2021-07-05 DIAGNOSIS — L97812 Non-pressure chronic ulcer of other part of right lower leg with fat layer exposed: Secondary | ICD-10-CM | POA: Diagnosis not present

## 2021-07-05 DIAGNOSIS — L03116 Cellulitis of left lower limb: Secondary | ICD-10-CM | POA: Diagnosis not present

## 2021-07-05 NOTE — Progress Notes (Signed)
[name], [name] ([id number]) ?Visit Report for 07/05/2021 ?Chief Complaint Document Details ?Patient Name: Date of Service: ?[name], Lauren E. 07/05/2021 1:30 PM ?Medical Record Number: [id number] ?Patient Account Number: [id number] ?Date of Birth/Sex: Treating RN: ?05-May-1965 (56 y.o. F) Deaton, Bobbi ?Primary Care Provider: [name] Other Clinician: ?Referring Provider: ?Treating Provider/Extender: Kalman Shan ?[name] ?Weeks in Treatment: 5 ?Information Obtained from: Patient ?Chief Complaint ?05/26/2021; Left lower extremity wound ?Electronic Signature(s) ?Signed: 07/05/2021 3:11:58 PM By: Kalman Shan DO ?Entered By: Kalman Shan on 07/05/2021 15:03:58 ?-------------------------------------------------------------------------------- ?Lauren Details ?Patient Name: Date of Service: ?[name], Lauren E. 07/05/2021 1:30 PM ?Medical Record Number: [id number] ?Patient Account Number: [id number] ?Date of Birth/Sex: Treating RN: ?24-Dec-1965 (56 y.o. F) Deaton, Bobbi ?Primary Care Provider: [name] Other Clinician: ?Referring Provider: ?Treating Provider/Extender: Kalman Shan ?[name] ?Weeks in Treatment: 5 ?History of Present Illness ?Lauren Description: Admission 05/26/2021 ?Ms. Jameica [name] is a 56 year old female with a past medical history of lymphedema That presents to the clinic for a 6-monthhistory of nonhealing ulcer to ?the posterior left leg. She has not been using any dressing to the wound bed. She has never experienced wounds before. She states that she sleeps in a ?recliner with her feet down. She has lymphedema pumps however has not been using them due to the wound and the increased swelling to her legs. She ?currently denies signs of infection. ?4/4; patient presents for follow-up. She states that the wrap slid down after a couple days. She states she tolerated it well prior to her coming loose. She denies ?signs of infection. ?4/11 comes in today with a substantial  wound on her left posterior calf. This is superficial in the setting of severe lymphedema. We have been using 3 layer ?compression. She had a PCR culture done last week which showed extremely high titers of Morganella but also high titers of Enterobacter and E. coli. [facility] ?The patient is not systemically unwell no fever no chills. Our intake nurse noted very significant odor however ?4/18; patient presents for follow-up. [facility]. She was started on ciprofloxacin at last clinic ?visit but states this caused her nausea and vomiting and she stopped. She denies systemic signs of infection. She tolerated the 4-layer compression wrap ?well. She has no issues or complaints today. ?4/25; patient presents for follow-up. She has been tolerating the 4-layer compression wrap well to the left lower extremity. Unfortunately she has a new wound ?to the right lower extremity. ?5/2; patient presents for follow-up. She has no issues or complaints today. She tolerated the 4-layer compression wraps well. ?5/9; patient presents for follow-up. She states she has an appointment to get her stockings fitted at "a special [facility]". She has no issues or complaints today. ?Electronic Signature(s) ?Signed: 07/05/2021 3:11:58 PM By: HKalman ShanDO ?Signed: 07/05/2021 3:11:58 PM By: HKalman ShanDO ?Entered By: HKalman Shanon 07/05/2021 15:05:23 ?-------------------------------------------------------------------------------- ?Physical Exam Details ?Patient Name: Date of Service: ?[name], PATANEE. 07/05/2021 1:30 PM ?Medical Record Number: [id number]?Patient Account Number: [id number]?Date of Birth/Sex: Treating RN: ?108/16/1967(56y.o. F) Deaton, Bobbi ?Primary Care Provider: FGeryl RankinsOther Clinician: ?Referring Provider: ?Treating Provider/Extender: HKalman Shan?FGeryl Rankins?Weeks in Treatment: 5 ?Constitutional ?respirations regular, non-labored and within target range for patient..Marland Kitchen?Cardiovascular ?2+ dorsalis pedis/posterior tibialis pulses. ?Psychiatric ?pleasant and cooperative. ?Notes ?Epithelization to the previous wound sites to the lower extremities bilaterally ?Electronic  Signature(s) ?Signed: 07/05/2021 3:11:58 PM By: Kalman Shan DO ?Entered By: Kalman Shan on 07/05/2021 15:08:56 ?-------------------------------------------------------------------------------- ?Physician Orders Details ?Patient Name: Date of Service: ?[name], Lauren E. 07/05/2021 1:30 PM ?Medical Record Number: [id number] ?Patient Account Number: [id number] ?Date of Birth/Sex: Treating RN: ?[birth year] (56 y.o. F) Deaton, Bobbi ?Primary Care Provider: [name] Other Clinician: ?Referring Provider: ?Treating Provider/Extender: Kalman Shan ?[name] ?Weeks in Treatment: 5 ?Verbal / Phone Orders: No ?Diagnosis Coding ?ICD-10 Coding ?Code Description ?N00.370 Non-pressure chronic ulcer of other part of left lower leg with fat layer exposed ?W88.891 Non-pressure chronic ulcer of other part of right lower leg with fat layer exposed ?I89.0 Lymphedema, not elsewhere classified ?L03.116 Cellulitis of left lower limb ?[facility]charge From [facility] ?Discharge from [place] - Call if any future wound care needs. ?Edema Control - Lymphedema / SCD / Other ?Lymphedema Pumps. Use Lymphedema pumps on leg(s) 2-3 times a day for 45-60 minutes. If wearing any wraps or hose, do not remove ?them. Continue exercising as instructed. - 2-3 times a day an hour each time. ?Elevate legs to the level of the heart or above for 30 minutes daily and/or when sitting, a frequency of: - 3-4 times a day throughout the day. ?Avoid standing for long periods of time. ?Patient to wear own compression stockings every day. - apply in the morning and remove at night. ?Moisturize legs daily. - both legs every night  before bed. ?Electronic Signature(s) ?Signed: 07/05/2021 3:11:58 PM By: Kalman Shan DO ?Entered By: Kalman Shan on 07/05/2021 15:09:04 ?-------------------------------------------------------------------------------- ?Problem List Details ?Patient Name: ?Date of Service: ?[name], Lauren E. 07/05/2021 1:30 PM ?Medical Record Number: [id number] ?Patient Account Number: [id number] ?Date of Birth/Sex: ?Treating RN: ?03-26-1965 (56 y.o. F) Deaton, Bobbi ?Primary Care Provider: [name] ?Other Clinician: ?Referring Provider: ?Treating Provider/Extender: Kalman Shan ?[name] ?Weeks in Treatment: 5 ?Active Problems ?ICD-10 ?Encounter ?Code Description Active Date MDM ?Diagnosis ?K80.034 Non-pressure chronic ulcer of other part of left lower leg with fat layer exposed3/30/2023 No Yes ?J17.915 Non-pressure chronic ulcer of other part of right lower leg with fat layer 06/21/2021 No Yes ?exposed ?I89.0 Lymphedema, not elsewhere classified 05/26/2021 No Yes ?Inactive Problems ?Resolved Problems ?ICD-10 ?Code Description Active Date Resolved Date ?L03.116 Cellulitis of left lower limb 06/07/2021 06/07/2021 ?Electronic Signature(s) ?Signed: 07/05/2021 3:11:58 PM By: Kalman Shan DO ?Entered By: Kalman Shan on 07/05/2021 15:03:42 ?-------------------------------------------------------------------------------- ?Progress Note Details ?Patient Name: ?Date of Service: ?[name], Lauren E. 07/05/2021 1:30 PM ?Medical Record Number: [id number] ?Patient Account Number: [id number] ?Date of Birth/Sex: ?Treating RN: ?Sep 12, 1965 (56 y.o. F) Deaton, Bobbi ?Primary Care Provider: [name] ?Other Clinician: ?Referring Provider: ?Treating Provider/Extender: Kalman Shan ?[name] ?Weeks in Treatment: 5 ?Subjective ?Chief Complaint ?Information obtained from Patient ?05/26/2021; Left lower extremity wound ?History of Present Illness (Lauren) ?Admission 05/26/2021 ?[name] is a 56 year old female with  a past medical history of lymphedema That presents to the clinic for a 11-monthhistory of nonhealing ulcer to ?the posterior left leg. She has not been using any dressing to the wound bed. She has never experience

## 2021-07-05 NOTE — Progress Notes (Signed)
BEREA, MAJKOWSKI ([id number]) ?Visit Report for 07/05/2021 ?Arrival Information Details ?Patient Name: Date of Service: ?CARMA, DWIGGINS E. 07/05/2021 1:30 PM ?Medical Record Number: [id number] ?Patient Account Number: [id number] ?Date of Birth/Sex: Treating RN: ?1965-09-21 (56 y.o. F) Deaton, Bobbi ?Primary Care Felica Chargois: [name] Other Clinician: ?Referring Tam Savoia: ?Treating Raun Routh/Extender: Kalman Shan ?[name] ?Weeks in Treatment: 5 ?Visit Information History Since Last Visit ?Added or deleted any medications: No ?Patient Arrived: [name] ?Any new allergies or adverse reactions: No ?Arrival Time: 13:58 ?Had a fall or experienced change in No ?Accompanied By: self ?activities of daily living that may affect ?Transfer Assistance: None ?risk of falls: ?Patient Identification Verified: Yes ?Signs or symptoms of abuse/neglect since last visito No ?Secondary Verification Process Completed: Yes ?Hospitalized since last visit: No ?Patient Requires Transmission-Based Precautions: No ?Implantable device outside of the clinic excluding No ?Patient Has Alerts: Yes ?cellular tissue based products placed in the center ?Patient Alerts: Patient on Blood Thinner since last visit: ?Has Dressing in Place as Prescribed: Yes ?Has Compression in Place as Prescribed: Yes ?Pain Present Now: No ?Notes ?per patient has an appt with [facility] to be fitted for compression stockings to BLE on Thursday 07/07/2021. ?Electronic Signature(s) ?Signed: 07/05/2021 5:49:10 PM By: Deon Pilling RN, BSN ?Entered By: Deon Pilling on 07/05/2021 13:59:29 ?-------------------------------------------------------------------------------- ?Clinic Level of Care Assessment Details ?Patient Name: Date of Service: ?ANUPAMA, PIEHL E. 07/05/2021 1:30 PM ?Medical Record Number: [id number] ?Patient Account Number: [id number] ?Date of Birth/Sex: Treating RN: ?1965-08-21 (57 y.o. F) Deaton, Bobbi ?Primary Care Kahli Mayon: [name] Other  Clinician: ?Referring Kaily Wragg: ?Treating Merilyn Pagan/Extender: Kalman Shan ?[name] ?Weeks in Treatment: 5 ?Clinic Level of Care Assessment Items ?TOOL 4 Quantity Score ?X- 1 0 ?Use when only an EandM is performed on FOLLOW-UP visit ?ASSESSMENTS - Nursing Assessment / Reassessment ?X- 1 10 ?Reassessment of Co-morbidities (includes updates in patient status) ?X- 1 5 ?Reassessment of Adherence to Treatment Plan ?ASSESSMENTS - Wound and Skin A ssessment / Reassessment ?'[]'$  - 0 ?Simple Wound Assessment / Reassessment - one wound ?X- 2 5 ?Complex Wound Assessment / Reassessment - multiple wounds ?X- 1 10 ?Dermatologic / Skin Assessment (not related to wound area) ?ASSESSMENTS - Focused Assessment ?X- 2 5 ?Circumferential Edema Measurements - multi extremities ?'[]'$  - 0 ?Nutritional Assessment / Counseling / Intervention ?'[]'$  - 0 ?Lower Extremity Assessment (monofilament, tuning fork, pulses) ?'[]'$  - 0 ?Peripheral Arterial Disease Assessment (using hand held doppler) ?ASSESSMENTS - Ostomy and/or Continence Assessment and Care ?'[]'$  - 0 ?Incontinence Assessment and Management ?'[]'$  - 0 ?Ostomy Care Assessment and Management (repouching, etc.) ?PROCESS - Coordination of Care ?'[]'$  - 0 ?Simple Patient / Family Education for ongoing care ?X- 1 20 ?Complex (extensive) Patient / Family Education for ongoing care ?X- 1 10 ?Staff obtains Consents, Records, T Results / Process Orders ?est ?'[]'$  - 0 ?Staff telephones HHA, Nursing Homes / Clarify orders / etc ?'[]'$  - 0 ?Routine Transfer to another Facility (non-emergent condition) ?'[]'$  - 0 ?Routine Hospital Admission (non-emergent condition) ?'[]'$  - 0 ?New Admissions / Biomedical engineer / Ordering NPWT Apligraf, etc. ?, ?'[]'$  - 0 ?Emergency Hospital Admission (emergent condition) ?'[]'$  - 0 ?Simple Discharge Coordination ?X- 1 15 ?Complex (extensive) Discharge Coordination ?PROCESS - Special Needs ?'[]'$  - 0 ?Pediatric / Minor Patient Management ?'[]'$  - 0 ?Isolation Patient Management ?'[]'$  -  0 ?Hearing / Language / Visual special needs ?'[]'$  - 0 ?Assessment of Community assistance (transportation, D/C planning, etc.) ?'[]'$  - 0 ?Additional assistance / Altered mentation ?'[]'$  - 0 ?Support Surface(s) Assessment (  bed, cushion, seat, etc.) ?INTERVENTIONS - Wound Cleansing / Measurement ?'[]'$  - 0 ?Simple Wound Cleansing - one wound ?X- 2 5 ?Complex Wound Cleansing - multiple wounds ?X- 1 5 ?Wound Imaging (photographs - any number of wounds) ?'[]'$  - 0 ?Wound Tracing (instead of photographs) ?'[]'$  - 0 ?Simple Wound Measurement - one wound ?X- 2 5 ?Complex Wound Measurement - multiple wounds ?INTERVENTIONS - Wound Dressings ?'[]'$  - 0 ?Small Wound Dressing one or multiple wounds ?'[]'$  - 0 ?Medium Wound Dressing one or multiple wounds ?'[]'$  - 0 ?Large Wound Dressing one or multiple wounds ?'[]'$  - 0 ?Application of Medications - topical ?'[]'$  - 0 ?Application of Medications - injection ?INTERVENTIONS - Miscellaneous ?'[]'$  - 0 ?External ear exam ?'[]'$  - 0 ?Specimen Collection (cultures, biopsies, blood, body fluids, etc.) ?'[]'$  - 0 ?Specimen(s) / Culture(s) sent or taken to Lab for analysis ?'[]'$  - 0 ?Patient Transfer (multiple staff / Civil Service fast streamer / Similar devices) ?'[]'$  - 0 ?Simple Staple / Suture removal (25 or less) ?'[]'$  - 0 ?Complex Staple / Suture removal (26 or more) ?'[]'$  - 0 ?Hypo / Hyperglycemic Management (close monitor of Blood Glucose) ?'[]'$  - 0 ?Ankle / Brachial Index (ABI) - do not check if billed separately ?X- 1 5 ?Vital Signs ?Has the patient been seen at the hospital within the last three years: Yes ?Total Score: 120 ?Level Of Care: New/Established - Level 4 ?Electronic Signature(s) ?Signed: 07/05/2021 5:49:10 PM By: Deon Pilling RN, BSN ?Entered By: Deon Pilling on 07/05/2021 14:45:08 ?-------------------------------------------------------------------------------- ?Encounter Discharge Information Details ?Patient Name: Date of Service: ?AALANI, AIKENS E. 07/05/2021 1:30 PM ?Medical Record Number: [id number] ?Patient Account Number:  [id number] ?Date of Birth/Sex: Treating RN: ?Nov 30, 1965 (56 y.o. F) Deaton, Bobbi ?Primary Care Stefhanie Kachmar: [name] Other Clinician: ?Referring Silena Wyss: ?Treating Shlonda Dolloff/Extender: Kalman Shan ?[name] ?Weeks in Treatment: 5 ?Encounter Discharge Information Items ?Discharge Condition: Stable ?Ambulatory Status: [name] ?Discharge Destination: Home ?Transportation: Private Auto ?Accompanied By: self ?Schedule Follow-up Appointment: No ?Clinical Summary of Care: ?Electronic Signature(s) ?Signed: 07/05/2021 5:49:10 PM By: Deon Pilling RN, BSN ?Entered By: Deon Pilling on 07/05/2021 14:45:36 ?-------------------------------------------------------------------------------- ?Lower Extremity Assessment Details ?Patient Name: Date of Service: ?SHADOE, CRYAN E. 07/05/2021 1:30 PM ?Medical Record Number: [id number] ?Patient Account Number: [id number] ?Date of Birth/Sex: Treating RN: ?11/12/1965 (56 y.o. F) Deaton, Bobbi ?Primary Care Ioane Bhola: [name] Other Clinician: ?Referring Lehua Flores: ?Treating Romi Rathel/Extender: Kalman Shan ?[name] ?Weeks in Treatment: 5 ?Edema Assessment ?Assessed: [Left: Yes] [Right: Yes] ?Edema: [Left: Yes] [Right: Yes] ?Calf ?Left: Right: ?Point of Measurement: 33 cm From Medial Instep 68 cm 61 cm ?Ankle ?Left: Right: ?Point of Measurement: 12 cm From Medial Instep 44 cm 50 cm ?Electronic Signature(s) ?Signed: 07/05/2021 5:49:10 PM By: Deon Pilling RN, BSN ?Entered By: Deon Pilling on 07/05/2021 14:10:45 ?-------------------------------------------------------------------------------- ?Multi Wound Chart Details ?Patient Name: ?Date of Service: ?ANGELYS, YETMAN E. 07/05/2021 1:30 PM ?Medical Record Number: [id number] ?Patient Account Number: [id number] ?Date of Birth/Sex: ?Treating RN: ?1965/06/26 (56 y.o. F) Deaton, Bobbi ?Primary Care Arshad Oberholzer: [name] ?Other Clinician: ?Referring Genesee Nase: ?Treating Dereonna Lensing/Extender: Kalman Shan ?[name] ?Weeks  in Treatment: 5 ?Vital Signs ?Height(in): 72 ?Pulse(bpm): 80 ?Weight(lbs): 359 ?Blood Pressure(mmHg): 131/77 ?Body Mass Index(BMI): 48.7 ?Temperature(??F): 98.1 ?Respiratory Rate(breaths/min): 20 ?Photos: [N/A:N

## 2021-07-05 NOTE — Progress Notes (Signed)
[name], [name] ([id number]) ?Visit Report for 06/28/2021 ?Arrival Information Details ?Patient Name: Date of Service: ?[name], Lauren E. 06/28/2021 1:30 PM ?Medical Record Number: [id number] ?Patient Account Number: [id number] ?Date of Birth/Sex: Treating RN: ?August 10, 1965 (56 y.o. Female) Sharyn Creamer ?Primary Care Conley Delisle: [name] Other Clinician: ?Referring Avedis Bevis: ?Treating Nyonna Hargrove/Extender: Kalman Shan ?[name] ?Weeks in Treatment: 4 ?Visit Information History Since Last Visit ?All ordered tests and consults were completed: No ?Patient Arrived: Ambulatory ?Added or deleted any medications: No ?Arrival Time: 13:36 ?Any new allergies or adverse reactions: No ?Accompanied By: self ?Had a fall or experienced change in No ?Transfer Assistance: None ?activities of daily living that may affect ?Patient Identification Verified: Yes ?risk of falls: ?Secondary Verification Process Completed: Yes ?Signs or symptoms of abuse/neglect since last visito No ?Patient Requires Transmission-Based Precautions: No ?Hospitalized since last visit: No ?Patient Has Alerts: Yes ?Implantable device outside of the clinic excluding No ?Patient Alerts: Patient on Blood Thinner cellular tissue based products placed in the center ?since last visit: ?Has Dressing in Place as Prescribed: Yes ?Has Compression in Place as Prescribed: Yes ?Pain Present Now: No ?Electronic Signature(s) ?Signed: 07/05/2021 8:25:23 AM By: Sharyn Creamer RN, BSN ?Entered By: Sharyn Creamer on 06/28/2021 13:37:49 ?-------------------------------------------------------------------------------- ?Compression Therapy Details ?Patient Name: Date of Service: ?[name], Lauren E. 06/28/2021 1:30 PM ?Medical Record Number: [id number] ?Patient Account Number: [id number] ?Date of Birth/Sex: Treating RN: ?06/11/1965 (56 y.o. Female) Sharyn Creamer ?Primary Care Celene Pippins: [name] Other Clinician: ?Referring Martika Egler: ?Treating Leeman Johnsey/Extender: Kalman Shan ?[name] ?Weeks in Treatment: 4 ?Compression Therapy Performed for Wound Assessment: Wound #1 Left,Posterior Lower Leg ?Performed By: Clinician Sharyn Creamer, RN ?Compression Type: Four Layer ?Post Procedure Diagnosis ?Same as Pre-procedure ?Electronic Signature(s) ?Signed: 07/05/2021 8:25:23 AM By: Sharyn Creamer RN, BSN ?Entered By: Sharyn Creamer on 06/28/2021 15:25:59 ?-------------------------------------------------------------------------------- ?Compression Therapy Details ?Patient Name: ?Date of Service: ?[name], Lauren E. 06/28/2021 1:30 PM ?Medical Record Number: [id number] ?Patient Account Number: [id number] ?Date of Birth/Sex: ?Treating RN: ?01/10/1966 (56 y.o. Female) Sharyn Creamer ?Primary Care Damir Leung: [name] ?Other Clinician: ?Referring Marwan Lipe: ?Treating Lelend Heinecke/Extender: Kalman Shan ?[name] ?Weeks in Treatment: 4 ?Compression Therapy Performed for Wound Assessment: Wound #2 Right,Posterior Lower Leg ?Performed By: Clinician Sharyn Creamer, RN ?Compression Type: Four Layer ?Post Procedure Diagnosis ?Same as Pre-procedure ?Electronic Signature(s) ?Signed: 07/05/2021 8:25:23 AM By: Sharyn Creamer RN, BSN ?Entered By: Sharyn Creamer on 06/28/2021 15:26:16 ?-------------------------------------------------------------------------------- ?Encounter Discharge Information Details ?Patient Name: ?Date of Service: ?[name], Lauren E. 06/28/2021 1:30 PM ?Medical Record Number: [id number] ?Patient Account Number: [id number] ?Date of Birth/Sex: ?Treating RN: ?[birth year] (56 y.o. Female) Sharyn Creamer ?Primary Care Efren Kross: [name] ?Other Clinician: ?Referring Ronit Marczak: ?Treating Duwane Gewirtz/Extender: Kalman Shan ?[name] ?Weeks in Treatment: 4 ?Encounter Discharge Information Items ?Discharge Condition: Stable ?Ambulatory Status: [name] ?Discharge Destination: Home ?Transportation: Private Auto ?Accompanied By: self ?Schedule Follow-up Appointment:  Yes ?Clinical Summary of Care: Patient Declined ?Electronic Signature(s) ?Signed: 07/05/2021 8:25:23 AM By: Sharyn Creamer RN, BSN ?Entered By: Sharyn Creamer on 06/28/2021 15:28:48 ?-------------------------------------------------------------------------------- ?Lower Extremity Assessment Details ?Patient Name: ?Date of Service: ?[name], Lauren E. 06/28/2021 1:30 PM ?Medical Record Number: [id number] ?Patient Account Number: [id number] ?Date of Birth/Sex: ?Treating RN: ?1965/05/27 (56 y.o. Female) Sharyn Creamer ?Primary Care Paeton Latouche: [name] ?Other Clinician: ?Referring Lupe Handley: ?Treating Oluwadarasimi Favor/Extender: Kalman Shan ?[name] ?Weeks in Treatment: 4 ?Edema Assessment ?Assessed: [Left: No] [Right: No] ?Edema: [Left: Yes] [Right: Yes] ?Calf ?Left: Right: ?Point of Measurement: 33 cm From Medial Instep 73 cm 61 cm ?Ankle ?Left: Right: ?Point of Measurement: 12 cm From Medial  Instep 57 cm 44 cm ?Knee To Floor ?Left: Right: ?From Medial Instep 46 cm ?Vascular Assessment ?Pulses: ?Dorsalis Pedis ?Palpable: [Left:No] [Right:No] ?Electronic Signature(s) ?Signed: 07/05/2021 8:25:23 AM By: Sharyn Creamer RN, BSN ?Entered By: Sharyn Creamer on 06/28/2021 14:05:23 ?-------------------------------------------------------------------------------- ?Multi Wound Chart Details ?Patient Name: ?Date of Service: ?[name], Lauren E. 06/28/2021 1:30 PM ?Medical Record Number: [id number] ?Patient Account Number: [id number] ?Date of Birth/Sex: ?Treating RN: ?1965-03-04 (56 y.o. Female) Rolin Barry, Tammi Klippel ?Primary Care Jammie Clink: [name] ?Other Clinician: ?Referring Darnisha Vernet: ?Treating Windel Keziah/Extender: Kalman Shan ?[name] ?Weeks in Treatment: 4 ?Vital Signs ?Height(in): 72 ?Pulse(bpm): 87 ?Weight(lbs): 359 ?Blood Pressure(mmHg): 131/80 ?Body Mass Index(BMI): 48.7 ?Temperature(??F): 97.9 ?Respiratory Rate(breaths/min): 18 ?Photos: [N/A:N/A] ?Left, Posterior Lower Leg Right, Posterior Lower Leg N/A ?Wound  Location: ?Gradually Appeared Gradually Appeared N/A ?Wounding Event: ?Lymphedema Lymphedema N/A ?Primary Etiology: ?Cataracts, Anemia, Lymphedema, Cataracts, Anemia, Lymphedema, N/A ?Comorbid History: ?Asthma, Deep Vein Thrombosis, Asthma, Deep Vein Thrombosis, ?Hypertension, Osteoarthritis Hypertension, Osteoarthritis ?02/25/2021 06/07/2021 N/A ?Date Acquired: ?4 1 N/A ?Weeks of Treatment: ?Open Open N/A ?Wound Status: ?No No N/A ?Wound Recurrence: ?0.1x0.1x0.1 0.5x1.5x0.1 N/A ?Measurements L x W x D (cm) ?0.008 0.589 N/A ?A (cm?) : ?rea ?0.001 0.059 N/A ?Volume (cm?) : ?100.00% 16.70% N/A ?% Reduction in Area: ?100.00% 16.90% N/A ?% Reduction in Volume: ?Full Thickness Without Exposed Full Thickness Without Exposed N/A ?Classification: ?Support Structures Support Structures ?Medium Medium N/A ?Exudate Amount: ?Serosanguineous Serosanguineous N/A ?Exudate Type: ?red, brown red, brown N/A ?Exudate Color: ?Distinct, outline attached Distinct, outline attached N/A ?Wound Margin: ?None Present (0%) Large (67-100%) N/A ?Granulation Amount: ?N/A Red N/A ?Granulation Quality: ?None Present (0%) None Present (0%) N/A ?Necrotic Amount: ?Fascia: No ?Fat Layer (Subcutaneous Tissue): Yes N/A ?Exposed Structures: ?Fat Layer (Subcutaneous Tissue): No ?Fascia: No ?Tendon: No ?Tendon: No ?Muscle: No ?Muscle: No ?Joint: No ?Joint: No ?Bone: No ?Bone: No ?Large (67-100%) Small (1-33%) N/A ?Epithelialization: ?Treatment Notes ?Electronic Signature(s) ?Signed: 06/28/2021 2:09:03 PM By: Kalman Shan DO ?Signed: 06/30/2021 5:16:25 PM By: Deon Pilling RN, BSN ?Entered By: Kalman Shan on 06/28/2021 14:03:52 ?-------------------------------------------------------------------------------- ?Multi-Disciplinary Care Plan Details ?Patient Name: ?Date of Service: ?[name], Lauren E. 06/28/2021 1:30 PM ?Medical Record Number: [id number] ?Patient Account Number: [id number] ?Date of Birth/Sex: ?Treating RN: ?1965/08/15 (56 y.o. Female) Sharyn Creamer ?Primary Care Almando Brawley: [name] ?Other Clinician: ?Referring Jaylen Claude: ?Treating Jayel Scaduto/Extender: Kalman Shan ?[name] ?Weeks in Treatment: 4 ?Active Inactive ?Venous Leg Ulcer ?Nursing Diagn

## 2021-07-05 NOTE — Progress Notes (Signed)
Lauren, [name] ([id number]) ?Visit Report for 06/28/2021 ?Chief Complaint Document Details ?Patient Name: Date of Service: ?[name], Lauren E. 06/28/2021 1:30 PM ?Medical Record Number: [id number] ?Patient Account Number: [id number] ?Date of Birth/Sex: Treating RN: ?1965-08-19 (56 y.o. Female) [name], [name] ?Primary Care Provider: Geryl [name] Other Clinician: ?Referring Provider: ?Treating Provider/Extender: [name] ?[name] ?Weeks in Treatment: 4 ?Information Obtained from: Patient ?Chief Complaint ?05/26/2021; Left lower extremity wound ?Electronic Signature(s) ?Signed: 06/28/2021 2:09:03 PM By: Lauren Shan DO ?Entered By: [name] on 06/28/2021 14:04:01 ?-------------------------------------------------------------------------------- ?HPI Details ?Patient Name: Date of Service: ?[name], Lauren E. 06/28/2021 1:30 PM ?Medical Record Number: [id number] ?Patient Account Number: [id number] ?Date of Birth/Sex: Treating RN: ?17-Jan-1966 (56 y.o. Female) [name], [name] ?Primary Care Provider: Geryl [name] Other Clinician: ?Referring Provider: ?Treating Provider/Extender: [name] ?[name] ?Weeks in Treatment: 4 ?History of Present Illness ?HPI Description: Admission 05/26/2021 ?Ms. [name] is a 56 year old female with a past medical history of lymphedema That presents to the clinic for a 9-monthhistory of nonhealing ulcer to ?the posterior left leg. She has not been using any dressing to the wound bed. She has never experienced wounds before. She states that she sleeps in a ?recliner with her feet down. She has lymphedema pumps however has not been using them due to the wound and the increased swelling to her legs. She ?currently denies signs of infection. ?4/4; patient presents for follow-up. She states that the wrap slid down after a couple days. She states she tolerated it well prior to her coming loose. She denies ?signs of infection. ?4/11 comes in today with a  substantial wound on her left posterior calf. This is superficial in the setting of severe lymphedema. We have been using 3 layer ?compression. She had a PCR culture done last week which showed extremely high titers of Morganella but also high titers of Enterobacter and E. coli. [facility] ?The patient is not systemically unwell no fever no chills. Our intake nurse noted very significant odor however ?4/18; patient presents for follow-up. [facility]. She was started on ciprofloxacin at last clinic ?visit but states this caused her nausea and vomiting and she stopped. She denies systemic signs of infection. She tolerated the 4-layer compression wrap ?well. She has no issues or complaints today. ?4/25; patient presents for follow-up. She has been tolerating the 4-layer compression wrap well to the left lower extremity. Unfortunately she has a new wound ?to the right lower extremity. ?5/2; patient presents for follow-up. She has no issues or complaints today. She tolerated the 4-layer compression wraps well. ?Electronic Signature(s) ?Signed: 06/28/2021 2:09:03 PM By: HKalman ShanDO ?Entered By: HKalman Shanon 06/28/2021 14:04:29 ?-------------------------------------------------------------------------------- ?Physical Exam Details ?Patient Name: Date of Service: ?[name], [name] 06/28/2021 1:30 PM ?Medical Record Number: [id number]?Patient Account Number: [id number]?Date of Birth/Sex: Treating RN: ?[birth year](56y.o. Female) DRolin [name] BTammi [name]?Primary Care Provider: FGeryl RankinsOther Clinician: ?Referring Provider: ?Treating Provider/Extender: HKalman [name]?FGeryl [name]?Weeks in Treatment: 4 ?Constitutional ?respirations regular, non-labored and within target range for patient..Marland Kitchen?Cardiovascular ?2+ dorsalis pedis/posterior tibialis pulses. ?Psychiatric ?pleasant  and cooperative. ?Notes ?Left lower extremity: T the posterior aspect there is a small area of skin breakdown, no weeping. No signs of surrounding infection. Right lower extremity: T ?o o ?the posterior aspect there is an open wound with granulation tissue throughout. No signs of surrounding infection. ?Electronic Signature(s) ?  Signed: 06/28/2021 2:09:03 PM By: Lauren Shan DO ?Entered By: [name] on 06/28/2021 14:05:15 ?-------------------------------------------------------------------------------- ?Physician Orders Details ?Patient Name: Date of Service: ?[name], Lauren E. 06/28/2021 1:30 PM ?Medical Record Number: [id number] ?Patient Account Number: [id number] ?Date of Birth/Sex: Treating RN: ?1965/07/24 (56 y.o. Female) Sharyn Creamer ?Primary Care Provider: Geryl [name] Other Clinician: ?Referring Provider: ?Treating Provider/Extender: [name] ?[name] ?Weeks in Treatment: 4 ?Verbal / Phone Orders: No ?Diagnosis Coding ?Follow-up Appointments ?ppointment in 1 week. - Dr. Heber [name] and [place], Room 8 07/05/2021 130pm ?Return A ?Other: - Bring in [employer] compounding antibiotcis at each appt visit. ?Bathing/ Shower/ Hygiene ?May shower with protection but do not get wound dressing(s) wet. - Can get cast protector bag at [facility], [facility] or [place] ?Edema Control - Lymphedema / SCD / Other ?Lymphedema Pumps. Use Lymphedema pumps on leg(s) 2-3 times a day for 45-60 minutes. If wearing any wraps or hose, do not remove ?them. Continue exercising as instructed. - Use as directed ?Elevate legs to the level of the heart or above for 30 minutes daily and/or when sitting, a frequency of: - throughout the day ?Avoid standing for long periods of time. ?Moisturize legs daily. ?Wound Treatment ?Wound #1 - Lower Leg Wound Laterality: Left, Posterior ?Cleanser: Soap and Water 1 x Per Week/30 Days ?Discharge Instructions: May shower and wash wound with dial antibacterial soap and water prior to dressing  change. ?Cleanser: Wound Cleanser 1 x Per Week/30 Days ?Discharge Instructions: Cleanse the wound with wound cleanser prior to applying a clean dressing using gauze sponges, not tissue or cotton balls. ?Peri-Wound Care: Sween Lotion (Moisturizing lotion) 1 x Per Week/30 Days ?Discharge Instructions: Apply moisturizing lotion as directed ?Topical: [facility] compounding topical antibiotics. 1 x Per Week/30 Days ?Discharge Instructions: apply directly to wound bed. ?Prim Dressing: KerraCel Ag Gelling Fiber Dressing, 4x5 in (silver alginate) 1 x Per Week/30 Days ?ary ?Discharge Instructions: Apply silver alginate to over keystone. ?Secondary Dressing: ABD Pad, 5x9 1 x Per Week/30 Days ?Discharge Instructions: Apply over primary dressing as directed. ?Secondary Dressing: CarboFLEX Odor Control Dressing, 4x4 in 1 x Per Week/30 Days ?Discharge Instructions: Apply over primary dressing as directed. ?Secondary Dressing: Zetuvit Plus 4x8 in 1 x Per Week/30 Days ?Discharge Instructions: Apply over primary dressing as directed. ?Compression Wrap: FourPress (4 layer compression wrap) 1 x Per Week/30 Days ?Discharge Instructions: Apply four layer compression as directed. May also use [place] CoFlex 2 layer compression system as ?alternative. ?Wound #2 - Lower Leg Wound Laterality: Right, Posterior ?Cleanser: Soap and Water 1 x Per Week/30 Days ?Discharge Instructions: May shower and wash wound with dial antibacterial soap and water prior to dressing change. ?Cleanser: Wound Cleanser 1 x Per Week/30 Days ?Discharge Instructions: Cleanse the wound with wound cleanser prior to applying a clean dressing using gauze sponges, not tissue or cotton balls. ?Peri-Wound Care: Sween Lotion (Moisturizing lotion) 1 x Per Week/30 Days ?Discharge Instructions: Apply moisturizing lotion as directed ?Prim Dressing: KerraCel Ag Gelling Fiber Dressing, 4x5 in (silver alginate) 1 x Per Week/30 Days ?ary ?Discharge Instructions: Apply silver alginate  to over keystone. ?Secondary Dressing: ABD Pad, 5x9 1 x Per Week/30 Days ?Discharge Instructions: Apply over primary dressing as directed. ?Secondary Dressing: CarboFLEX Odor Control Dressing, 4x4 in 1 x Per Week/3

## 2021-07-13 DIAGNOSIS — D6861 Antiphospholipid syndrome: Secondary | ICD-10-CM | POA: Diagnosis not present

## 2021-07-13 DIAGNOSIS — M0579 Rheumatoid arthritis with rheumatoid factor of multiple sites without organ or systems involvement: Secondary | ICD-10-CM | POA: Diagnosis not present

## 2021-07-13 DIAGNOSIS — M2550 Pain in unspecified joint: Secondary | ICD-10-CM | POA: Diagnosis not present

## 2021-07-13 DIAGNOSIS — I890 Lymphedema, not elsewhere classified: Secondary | ICD-10-CM | POA: Diagnosis not present

## 2021-07-13 DIAGNOSIS — R5382 Chronic fatigue, unspecified: Secondary | ICD-10-CM | POA: Diagnosis not present

## 2021-07-13 DIAGNOSIS — Z6841 Body Mass Index (BMI) 40.0 and over, adult: Secondary | ICD-10-CM | POA: Diagnosis not present

## 2021-07-13 DIAGNOSIS — E559 Vitamin D deficiency, unspecified: Secondary | ICD-10-CM | POA: Diagnosis not present

## 2021-08-03 ENCOUNTER — Other Ambulatory Visit: Payer: Self-pay | Admitting: Nurse Practitioner

## 2021-08-03 DIAGNOSIS — D649 Anemia, unspecified: Secondary | ICD-10-CM

## 2021-08-03 DIAGNOSIS — R000 Tachycardia, unspecified: Secondary | ICD-10-CM

## 2021-08-05 ENCOUNTER — Ambulatory Visit: Payer: Medicare HMO | Admitting: Nurse Practitioner

## 2021-08-23 ENCOUNTER — Other Ambulatory Visit: Payer: Medicare HMO

## 2021-09-07 ENCOUNTER — Encounter: Payer: Self-pay | Admitting: Physician Assistant

## 2021-09-07 ENCOUNTER — Emergency Department (HOSPITAL_COMMUNITY)
Admission: EM | Admit: 2021-09-07 | Discharge: 2021-09-07 | Disposition: A | Discharge status: Home / Self Care | Payer: Medicare HMO | Attending: Emergency Medicine | Admitting: Emergency Medicine

## 2021-09-07 ENCOUNTER — Ambulatory Visit: Payer: Medicare HMO | Attending: Nurse Practitioner | Admitting: Physician Assistant

## 2021-09-07 ENCOUNTER — Other Ambulatory Visit: Payer: Self-pay

## 2021-09-07 ENCOUNTER — Encounter (HOSPITAL_COMMUNITY): Payer: Self-pay

## 2021-09-07 VITALS — BP 154/83 | HR 97

## 2021-09-07 DIAGNOSIS — Z91040 Latex allergy status: Secondary | ICD-10-CM | POA: Insufficient documentation

## 2021-09-07 DIAGNOSIS — M542 Cervicalgia: Secondary | ICD-10-CM | POA: Insufficient documentation

## 2021-09-07 DIAGNOSIS — R110 Nausea: Secondary | ICD-10-CM | POA: Diagnosis not present

## 2021-09-07 DIAGNOSIS — R42 Dizziness and giddiness: Secondary | ICD-10-CM | POA: Diagnosis not present

## 2021-09-07 DIAGNOSIS — G4489 Other headache syndrome: Secondary | ICD-10-CM | POA: Diagnosis not present

## 2021-09-07 DIAGNOSIS — G4486 Cervicogenic headache: Secondary | ICD-10-CM

## 2021-09-07 DIAGNOSIS — R519 Headache, unspecified: Secondary | ICD-10-CM | POA: Diagnosis not present

## 2021-09-07 DIAGNOSIS — Z7901 Long term (current) use of anticoagulants: Secondary | ICD-10-CM | POA: Insufficient documentation

## 2021-09-07 DIAGNOSIS — Z86711 Personal history of pulmonary embolism: Secondary | ICD-10-CM | POA: Diagnosis not present

## 2021-09-07 DIAGNOSIS — Z743 Need for continuous supervision: Secondary | ICD-10-CM | POA: Diagnosis not present

## 2021-09-07 DIAGNOSIS — I10 Essential (primary) hypertension: Secondary | ICD-10-CM | POA: Diagnosis not present

## 2021-09-07 DIAGNOSIS — Z79899 Other long term (current) drug therapy: Secondary | ICD-10-CM | POA: Diagnosis not present

## 2021-09-07 DIAGNOSIS — R1013 Epigastric pain: Secondary | ICD-10-CM | POA: Diagnosis not present

## 2021-09-07 MED ORDER — ONDANSETRON 4 MG PO TBDP
4.0000 mg | ORAL_TABLET | Freq: Once | ORAL | Status: AC
  Administered 2021-09-07: 4 mg via ORAL
  Filled 2021-09-07: qty 1

## 2021-09-07 MED ORDER — HYDROCODONE-ACETAMINOPHEN 5-325 MG PO TABS
1.0000 | ORAL_TABLET | Freq: Once | ORAL | Status: AC
  Administered 2021-09-07: 1 via ORAL
  Filled 2021-09-07: qty 1

## 2021-09-07 MED ORDER — LIDOCAINE 5 % EX PTCH
2.0000 | MEDICATED_PATCH | CUTANEOUS | Status: DC
  Administered 2021-09-07: 2 via TRANSDERMAL
  Filled 2021-09-07: qty 2

## 2021-09-07 MED ORDER — APIXABAN 5 MG PO TABS: 5.0000 mg | ORAL_TABLET | Freq: Two times a day (BID) | ORAL | 3 refills | Status: DC

## 2021-09-07 MED ORDER — CYCLOBENZAPRINE HCL 10 MG PO TABS
10.0000 mg | ORAL_TABLET | Freq: Two times a day (BID) | ORAL | 0 refills | Status: DC | PRN
Start: 2021-09-07 — End: 2021-11-09

## 2021-09-07 MED ORDER — LIDOCAINE 5 % EX PTCH: 1.0000 | MEDICATED_PATCH | CUTANEOUS | 0 refills | Status: DC
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
## 2021-09-07 NOTE — Discharge Instructions (Addendum)
Will likely be increasingly sore over the next few days.  You may take Tylenol and ibuprofen for your discomfort.    Additionally I have sent lidocaine patches and muscle relaxants to your pharmacy.  Remember the cyclobenzaprine/Flexeril is something that makes you drowsy.  Do not drive or operate machinery on this.  The lidocaine patches cannot be worn for more than 12 hours in a row.  It was a pleasure to meet you and I hope you feel better.  Return with any worsening symptoms.
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
## 2021-09-07 NOTE — ED Notes (Signed)
Reviewed discharge instructions with patient. Follow-up care and medications reviewed. Patient  verbalized understanding. Patient A&Ox4, VSS, and ambulatory with steady gait upon discharge.  °

## 2021-09-13 ENCOUNTER — Ambulatory Visit: Payer: Medicare HMO | Admitting: Podiatry

## 2021-09-14 ENCOUNTER — Encounter: Payer: Self-pay | Admitting: Podiatry

## 2021-09-14 ENCOUNTER — Ambulatory Visit (INDEPENDENT_AMBULATORY_CARE_PROVIDER_SITE_OTHER): Payer: Medicare HMO | Admitting: Podiatry

## 2021-09-14 DIAGNOSIS — M79675 Pain in left toe(s): Secondary | ICD-10-CM | POA: Diagnosis not present

## 2021-09-14 DIAGNOSIS — B351 Tinea unguium: Secondary | ICD-10-CM | POA: Diagnosis not present

## 2021-09-14 DIAGNOSIS — I890 Lymphedema, not elsewhere classified: Secondary | ICD-10-CM | POA: Diagnosis not present

## 2021-09-14 DIAGNOSIS — M79674 Pain in right toe(s): Secondary | ICD-10-CM

## 2021-09-21 NOTE — Progress Notes (Signed)
Subjective:  Patient ID: BEV DRENNEN, female    DOB: August 09, 1965,  MRN: [id number]  [name] presents to clinic today for at risk foot care. Patient has history of bilalteral lymphedema and painful elongated overgrown toenails which are tender when wearing enclosed shoe gear.  Patient is also here to pick up her new pair of shoes.  She has severe lymphedema with elephantiasis of b/l LE. She does have lymphedema pump at home, but states there are times she cannot apply the device to her legs.  She is limited by transportation issues as her mother provides transportation for her, but her mother also is caregiver to her own sister with dementia. Therefore, outpatient wound care or lymphedema clinic is not an option. She has been seen in [place] in the past for LE wounds due to her lymphedema.   PCP is Gildardo Pounds, NP , and last visit was  January 14, 2021  Allergies  Allergen Reactions   Abatacept Anaphylaxis    Other reaction(s): Anaphylaxis-Streptomycin Clickject Other reaction(s): Anaphylaxis-Streptomycin   Bee Venom Anaphylaxis   Augmentin [Amoxicillin-Pot Clavulanate] Hives, Itching and Other (See Comments)    Did PCN reaction causing immediate rash, facial/tongue/throat swelling, SOB or lightheadedness with hypotension: yes Did PCN reaction causing severe rash involving mucus membranes or skin necrosis: no Has patient had a PCN reaction that required hospitalization: in hospital Has patient had a PCN reaction occurring within the last 10 years: no If all of the above answers are "NO", then may proceed with Cephalosporin use.  Pt reports no recollection of any reactions when taking penicillin in past   Latex Hives and Other (See Comments)    Local reaction (welts). Patient denies any wheezing or other reaction with latex exposure   Nulytely [Peg 3350-Kcl-Na Bicarb-Nacl]     NAUSEA AND VOMITING. MAY TOLERATE LOW VOLUME PREP.    Review of Systems: Negative  except as noted in the HPI.  Objective: No changes noted in today's physical examination. Constitutional [name] is a pleasant 56 y.o. African American female, morbidly obese in NAD. AAO x 3.   Vascular Capillary refill time to digits immediate b/l. Palpable pedal pulses b/l LE. Pedal hair absent. No pain with calf compression b/l. Lymphedema present BLE. No ischemia or gangrene noted b/l LE. No cyanosis or clubbing noted b/l LE.  Neurologic Normal speech. Oriented to person, place, and time. Protective sensation intact 5/5 intact bilaterally with 10g monofilament b/l. Vibratory sensation intact b/l.  Dermatologic No interdigital macerations noted b/l LE. Toenails 1-5 b/l elongated, discolored, dystrophic, thickened, crumbly with subungual debris and tenderness to dorsal palpation. Skin b/l lower extremities noted to be thickened and brawny consistent with lymphedema.  Orthopedic: Muscle strength 5/5 to all lower extremity muscle groups bilaterally. Sausage digits 1-5 b/l. Utilizes rollator for ambulation assistance.   Radiographs: None    Latest Ref Rng & Units 05/05/2021    2:53 PM 10/12/2020    2:11 PM  Hemoglobin A1C  Hemoglobin-A1c 4.8 - 5.6 % 5.5  6.0    Assessment/Plan: 1. Pain due to onychomycosis of toenails of both feet   2. Lymphedema of both lower extremities   3. Elephantiasis     -Patient was evaluated and treated. All patient's and/or POA's questions/concerns answered on today's visit. -We discussed various clinics such as Lymphedema clinic at [facility], but transportation is a barrier for access to care right now. She states she will call [facility] as she feels  she has a new wound starting again. I will see if we can find any additional help to assist with management of her lymphedema.. -Mycotic toenails 1-5 bilaterally were debrided in length and girth with sterile nail nippers and dremel without incident. -Patient/POA to call should  there be question/concern in the interim.   Return in about 3 months (around 12/15/2021).  Marzetta Board, DPM

## 2021-10-04 ENCOUNTER — Emergency Department (HOSPITAL_COMMUNITY)
Admission: EM | Admit: 2021-10-04 | Discharge: 2021-10-05 | Disposition: A | Discharge status: Home / Self Care | Payer: Medicare HMO | Attending: Emergency Medicine | Admitting: Emergency Medicine

## 2021-10-04 ENCOUNTER — Other Ambulatory Visit: Payer: Self-pay

## 2021-10-04 DIAGNOSIS — I10 Essential (primary) hypertension: Secondary | ICD-10-CM | POA: Diagnosis not present

## 2021-10-04 DIAGNOSIS — I890 Lymphedema, not elsewhere classified: Secondary | ICD-10-CM | POA: Insufficient documentation

## 2021-10-04 DIAGNOSIS — Z91040 Latex allergy status: Secondary | ICD-10-CM | POA: Diagnosis not present

## 2021-10-04 DIAGNOSIS — R609 Edema, unspecified: Secondary | ICD-10-CM | POA: Diagnosis not present

## 2021-10-04 DIAGNOSIS — T148XXA Other injury of unspecified body region, initial encounter: Secondary | ICD-10-CM

## 2021-10-04 DIAGNOSIS — J80 Acute respiratory distress syndrome: Secondary | ICD-10-CM | POA: Diagnosis not present

## 2021-10-04 DIAGNOSIS — R509 Fever, unspecified: Secondary | ICD-10-CM | POA: Diagnosis not present

## 2021-10-04 DIAGNOSIS — Z743 Need for continuous supervision: Secondary | ICD-10-CM | POA: Diagnosis not present

## 2021-10-04 DIAGNOSIS — T8130XA Disruption of wound, unspecified, initial encounter: Secondary | ICD-10-CM | POA: Diagnosis not present

## 2021-10-04 DIAGNOSIS — Z7901 Long term (current) use of anticoagulants: Secondary | ICD-10-CM | POA: Diagnosis not present

## 2021-10-04 LAB — CBC WITH DIFFERENTIAL/PLATELET
Abs Immature Granulocytes: 0.02 10*3/uL (ref 0.00–0.07)
Basophils Absolute: 0 10*3/uL (ref 0.0–0.1)
Basophils Relative: 1 %
Eosinophils Absolute: 0.2 10*3/uL (ref 0.0–0.5)
Eosinophils Relative: 3 %
HCT: 36.4 % (ref 36.0–46.0)
Hemoglobin: 11.1 g/dL — ABNORMAL LOW (ref 12.0–15.0)
Immature Granulocytes: 0 %
Lymphocytes Relative: 16 %
Lymphs Abs: 1.1 10*3/uL (ref 0.7–4.0)
MCH: 29.2 pg (ref 26.0–34.0)
MCHC: 30.5 g/dL (ref 30.0–36.0)
MCV: 95.8 fL (ref 80.0–100.0)
Monocytes Absolute: 0.7 10*3/uL (ref 0.1–1.0)
Monocytes Relative: 10 %
Neutro Abs: 4.5 10*3/uL (ref 1.7–7.7)
Neutrophils Relative %: 70 %
Platelets: 304 10*3/uL (ref 150–400)
RBC: 3.8 MIL/uL — ABNORMAL LOW (ref 3.87–5.11)
RDW: 16.1 % — ABNORMAL HIGH (ref 11.5–15.5)
WBC: 6.5 10*3/uL (ref 4.0–10.5)
nRBC: 0 % (ref 0.0–0.2)

## 2021-10-04 LAB — COMPREHENSIVE METABOLIC PANEL
ALT: 13 U/L (ref 0–44)
AST: 19 U/L (ref 15–41)
Albumin: 3.9 g/dL (ref 3.5–5.0)
Alkaline Phosphatase: 44 U/L (ref 38–126)
Anion gap: 5 (ref 5–15)
BUN: 10 mg/dL (ref 6–20)
CO2: 27 mmol/L (ref 22–32)
Calcium: 9.9 mg/dL (ref 8.9–10.3)
Chloride: 109 mmol/L (ref 98–111)
Creatinine, Ser: 0.9 mg/dL (ref 0.44–1.00)
GFR, Estimated: >60 mL/min (ref >60)
Glucose, Bld: 106 mg/dL — ABNORMAL HIGH (ref 70–99)
Potassium: 3.9 mmol/L (ref 3.5–5.1)
Sodium: 141 mmol/L (ref 135–145)
Total Bilirubin: 0.9 mg/dL (ref 0.3–1.2)
Total Protein: 8.5 g/dL — ABNORMAL HIGH (ref 6.5–8.1)

## 2021-10-04 NOTE — ED Provider Triage Note (Signed)
Emergency Medicine Provider Triage Evaluation Note  [name] , a 56 y.o. female  was evaluated in triage.  Pt complains of increased leg swelling.  Pt has lymphedema and she is unable to get in her bed or lift her legs.    Review of Systems  Positive: Asthma atack earlier Negative: fever  Physical Exam  LMP 01/31/2011   SpO2 100%  Gen:   Awake, no distress   Resp:  Normal effort  MSK:   Massive amount of edema, foul odor  Medical Decision Making  Medically screening exam initiated at 9:32 PM.  Appropriate orders placed.  [name] was informed that the remainder of the evaluation will be completed by another provider, this initial triage assessment does not replace that evaluation, and the importance of remaining in the ED until their evaluation is complete.     [name], [place] 10/04/21 2133

## 2021-10-04 NOTE — ED Triage Notes (Signed)
Patient BIB EMS for evaluation of bilateral leg swelling and wounds.  Has not seen wound clinic in over two months.  Legs are odorous.  Original call was for asthma and SHOB.  Gave herself Albuterol inhaler x 2 puffs with improvement in symptoms.  No complaints of SHOB at this time.

## 2021-10-05 ENCOUNTER — Other Ambulatory Visit: Payer: Self-pay

## 2021-10-05 NOTE — ED Notes (Signed)
Awaiting for SW to come speak with patient about setting up [facility]

## 2021-10-05 NOTE — Progress Notes (Addendum)
CSW asked EDP to place [facility] order. This CSW contacted [facility] and spoke with Malachy Mood, who shared they can provide a [facility] RN to dress pt's wound once a week. CSW is providing pt with Rocky Morel' contact information per Cheryl's request. Malachy Mood will contact the pt to coordinate start date.   RN informed CSW that pt was waiting on wound care nurse after visit with pt. RN notified CSW that pt was not seen by wound care nurse @ 11:30 AM.   Ulysees Barns, MSW, [email or web address]

## 2021-10-05 NOTE — ED Notes (Signed)
TOC at bedside

## 2021-10-05 NOTE — ED Notes (Signed)
Called daughter to pick patient up

## 2021-10-05 NOTE — ED Provider Notes (Signed)
[facility] DEPT Provider Note   CSN: [id number] Arrival date & time: 10/04/21  2031     History  Chief Complaint  Patient presents with   Leg Swelling    [name] is a 56 y.o. female.  HPI 56 yo female with lymphedema presents today with worsening lle wound.  Patient had been going to wound care clinic but states wound was better so discontinued.  Wound now back and continues to be grayish and some d/c. Patient still has abx ointment that was used but has no one to apply it.  She has not seen her pmd.  She is scheduled to return to wound clinic on August 22 and they will call if earlier opening.  Some subjective fever and chills.  Denies cough, fever, abdominal pain, nausea vomiting, or diarrhea.  Patient was supposed to get compression sleeves but unable to pick up as busy and "it has been crazy." Patient has abx ointmnet cipro, gent,  and linazolide      Home Medications Prior to Admission medications   Medication Sig Start Date End Date Taking? Authorizing Provider  acetaminophen (TYLENOL) 500 MG tablet Take 1,000 mg by mouth every 6 (six) hours as needed for moderate pain.    [provider]  albuterol (VENTOLIN HFA) 108 (90 Base) MCG/ACT inhaler Inhale 2 puffs into the lungs every 6 (six) hours as needed for wheezing or shortness of breath. 07/12/20   Gildardo Pounds, NP  apixaban (ELIQUIS) 5 MG TABS tablet Take 1 tablet (5 mg total) by mouth 2 (two) times daily. 09/07/21 12/06/21  Argentina Donovan, PA-C  carvedilol (COREG) 6.25 MG tablet Take 6.25 mg by mouth 2 (two) times daily. 05/09/21   [provider]  CIMZIA PREFILLED 2 X 200 MG/ML PSKT Inject 200 mg into the skin every 14 (fourteen) days. 08/09/20   [provider]  CLENPIQ 10-3.5-12 MG-GM -GM/160ML SOLN Take by mouth once. 01/12/21   [provider]  cyclobenzaprine (FLEXERIL) 10 MG tablet Take 1 tablet (10 mg total) by mouth 2 (two) times  daily as needed for muscle spasms. 09/07/21   Redwine, Madison A, PA-C  ergocalciferol (VITAMIN D2) 1.25 MG (50000 UT) capsule 1 capsule Orally q week for 30 day(s)    [provider]  folic acid (FOLVITE) 1 MG tablet Take 1 tablet by mouth once daily 08/03/21   Charlott Rakes, MD  furosemide (LASIX) 20 MG tablet Take 1 tablet (20 mg total) by mouth daily. X 7 days then prn swelling 05/05/21   Freeman Caldron M, PA-C  isosorbide mononitrate (IMDUR) 30 MG 24 hr tablet Take 1 tablet (30 mg total) by mouth at bedtime. 05/09/21   Early Osmond, MD  lidocaine (LIDODERM) 5 % Place 1 patch onto the skin daily. Remove & Discard patch within 12 hours or as directed by MD 09/07/21   Redwine, Madison A, PA-C  linezolid (ZYVOX) 600 MG tablet Take by mouth. 06/07/21   [provider]  methotrexate 50 MG/2ML injection Inject 20 mg into the skin once a week. Patient reports 0.8 ml weekly 07/27/20   [provider]  metoprolol succinate (TOPROL XL) 25 MG 24 hr tablet Take 1 tablet (25 mg total) by mouth at bedtime. 05/09/21   Early Osmond, MD  Misc. Devices (BARIATRIC ROLLATOR) MISC Please provide patient with insurance approved bariatric rollator walker. I89.0, R53.81, R26.81 11/05/19   Gildardo Pounds, NP  MYRBETRIQ 50 MG [phone]  tablet Take 50 mg by mouth daily. 11/24/20   [provider]  nitroGLYCERIN (NITROSTAT) 0.4 MG SL tablet Place 1 tablet (0.4 mg total) under the tongue every 5 (five) minutes as needed for chest pain. 05/05/21   Argentina Donovan, PA-C      Allergies    Abatacept, Bee venom, Augmentin [amoxicillin-pot clavulanate], Latex, and Nulytely [peg 3350-kcl-na bicarb-nacl]    Review of Systems   Review of Systems  Physical Exam Updated Vital Signs BP (!) 179/95   Pulse 99   Temp 97.9 F (36.6 C) (Oral)   Resp 20   Ht 1.829 m (6')   Wt (!) 161.9 kg   LMP 01/31/2011   SpO2 100%   BMI 48.42 kg/m  Physical Exam Vitals reviewed.  Constitutional:       Appearance: Normal appearance. She is obese.  HENT:     Head: Normocephalic.     Right Ear: External ear normal.     Left Ear: External ear normal.     Nose: Nose normal.     Mouth/Throat:     Pharynx: Oropharynx is clear.  Cardiovascular:     Rate and Rhythm: Normal rate and regular rhythm.  Pulmonary:     Effort: Pulmonary effort is normal.     Breath sounds: Normal breath sounds.  Abdominal:     General: Abdomen is flat. Bowel sounds are normal.     Palpations: Abdomen is soft.  Musculoskeletal:        General: Swelling present. Normal range of motion.     Cervical back: Normal range of motion.     Comments: Severe lymphedema present in wound noted on left lower leg  Skin:    General: Skin is warm and dry.     Capillary Refill: Capillary refill takes less than 2 seconds.  Psychiatric:        Mood and Affect: Mood normal.        Behavior: Behavior normal.     ED Results / Procedures / Treatments   Labs (all labs ordered are listed, but only abnormal results are displayed) Labs Reviewed  CBC WITH DIFFERENTIAL/PLATELET - Abnormal; Notable for the following components:      Result Value   RBC 3.80 (*)    Hemoglobin 11.1 (*)    RDW 16.1 (*)    All other components within normal limits  COMPREHENSIVE METABOLIC PANEL - Abnormal; Notable for the following components:   Glucose, Bld 106 (*)    Total Protein 8.5 (*)    All other components within normal limits    EKG None  Radiology No results found.  Procedures Procedures    Medications Ordered in ED Medications - No data to display  ED Course/ Medical Decision Making/ A&P                           Medical Decision Making 56 year old female with severe lymphedema and ability to care for wound Patient with prior wound in same area cared for at wound center According the patient, this is stable from before but has recurred. She has antibiotic ointment but states she cannot places herself Transitional care  consult placed and patient to have home health. We have discussed return precautions such as worsening redness swelling increased infectious symptoms.           Final Clinical Impression(s) / ED Diagnoses Final diagnoses:  Lymphedema  Wound discharge    Rx / DC Orders  ED Discharge Orders     None         Pattricia Boss, MD 10/05/21 1039

## 2021-10-05 NOTE — Discharge Instructions (Signed)
Home health care has been arranged to do dressing changes Please follow-up with wound care as soon as possible Return if you are having worsening pain, swelling, fever, chills

## 2021-10-05 NOTE — ED Notes (Signed)
Pt advises she will need a purewick.

## 2021-10-13 DIAGNOSIS — Z86711 Personal history of pulmonary embolism: Secondary | ICD-10-CM | POA: Diagnosis not present

## 2021-10-13 DIAGNOSIS — R238 Other skin changes: Secondary | ICD-10-CM | POA: Diagnosis not present

## 2021-10-13 DIAGNOSIS — J45909 Unspecified asthma, uncomplicated: Secondary | ICD-10-CM | POA: Diagnosis not present

## 2021-10-13 DIAGNOSIS — Z111 Encounter for screening for respiratory tuberculosis: Secondary | ICD-10-CM | POA: Diagnosis not present

## 2021-10-13 DIAGNOSIS — E669 Obesity, unspecified: Secondary | ICD-10-CM | POA: Diagnosis not present

## 2021-10-13 DIAGNOSIS — M0579 Rheumatoid arthritis with rheumatoid factor of multiple sites without organ or systems involvement: Secondary | ICD-10-CM | POA: Diagnosis not present

## 2021-10-13 DIAGNOSIS — R69 Illness, unspecified: Secondary | ICD-10-CM | POA: Diagnosis not present

## 2021-10-13 DIAGNOSIS — Z7901 Long term (current) use of anticoagulants: Secondary | ICD-10-CM | POA: Diagnosis not present

## 2021-10-13 DIAGNOSIS — M1990 Unspecified osteoarthritis, unspecified site: Secondary | ICD-10-CM | POA: Diagnosis not present

## 2021-10-13 DIAGNOSIS — R5383 Other fatigue: Secondary | ICD-10-CM | POA: Diagnosis not present

## 2021-10-13 DIAGNOSIS — M62838 Other muscle spasm: Secondary | ICD-10-CM | POA: Diagnosis not present

## 2021-10-13 DIAGNOSIS — Z4800 Encounter for change or removal of nonsurgical wound dressing: Secondary | ICD-10-CM | POA: Diagnosis not present

## 2021-10-13 DIAGNOSIS — I209 Angina pectoris, unspecified: Secondary | ICD-10-CM | POA: Diagnosis not present

## 2021-10-13 DIAGNOSIS — I890 Lymphedema, not elsewhere classified: Secondary | ICD-10-CM | POA: Diagnosis not present

## 2021-10-13 DIAGNOSIS — Z6841 Body Mass Index (BMI) 40.0 and over, adult: Secondary | ICD-10-CM | POA: Diagnosis not present

## 2021-10-13 DIAGNOSIS — Z79899 Other long term (current) drug therapy: Secondary | ICD-10-CM | POA: Diagnosis not present

## 2021-10-14 ENCOUNTER — Ambulatory Visit: Payer: Self-pay | Admitting: *Deleted

## 2021-10-14 NOTE — Telephone Encounter (Signed)
Routing to CMA 

## 2021-10-14 NOTE — Telephone Encounter (Signed)
Call received from Rory Percy from [facility] [phone]. Ms. Justin Mend requesting to notify PCP to sign orders for Home Health. Requesting a call back to review history  of patient and reason for medications. Please advise       . Reason for Disposition  [1] Caller requesting NON-URGENT health information AND [2] PCP's office is the best resource  Answer Assessment - Initial Assessment Questions 1. REASON FOR CALL or QUESTION: "What is your reason for calling today?" or "How can I best help you?" or "What question do you have that I can help answer?"     Home Health requesting history for patient and to sign home health orders.  Protocols used: Information Only Call - No Triage-A-AH

## 2021-10-17 ENCOUNTER — Other Ambulatory Visit: Payer: Self-pay

## 2021-10-17 DIAGNOSIS — Z0100 Encounter for examination of eyes and vision without abnormal findings: Secondary | ICD-10-CM

## 2021-10-17 NOTE — Telephone Encounter (Signed)
Waitng to pcp to sign then I will fax.

## 2021-10-17 NOTE — Addendum Note (Signed)
Addended by: Gasper Lloyd on: 10/17/2021 11:58 AM   Modules accepted: Orders

## 2021-10-17 NOTE — Telephone Encounter (Signed)
Call returned and will fax when ready.

## 2021-10-18 ENCOUNTER — Encounter (HOSPITAL_BASED_OUTPATIENT_CLINIC_OR_DEPARTMENT_OTHER): Payer: Medicare HMO | Attending: Internal Medicine | Admitting: Internal Medicine

## 2021-10-18 DIAGNOSIS — L97822 Non-pressure chronic ulcer of other part of left lower leg with fat layer exposed: Secondary | ICD-10-CM | POA: Insufficient documentation

## 2021-10-18 DIAGNOSIS — I890 Lymphedema, not elsewhere classified: Secondary | ICD-10-CM | POA: Diagnosis not present

## 2021-10-18 DIAGNOSIS — I87312 Chronic venous hypertension (idiopathic) with ulcer of left lower extremity: Secondary | ICD-10-CM | POA: Diagnosis not present

## 2021-10-18 DIAGNOSIS — L97812 Non-pressure chronic ulcer of other part of right lower leg with fat layer exposed: Secondary | ICD-10-CM | POA: Diagnosis not present

## 2021-10-18 DIAGNOSIS — I87313 Chronic venous hypertension (idiopathic) with ulcer of bilateral lower extremity: Secondary | ICD-10-CM | POA: Diagnosis not present

## 2021-10-18 NOTE — Progress Notes (Signed)
[name], [name] ([id number]) Visit Report for 10/18/2021 Abuse Risk Screen Details Patient Name: Date of Service: [name], [name] 10/18/2021 9:45 A M Medical Record Number: [id number] Patient Account Number: [id number] Date of Birth/Sex: Treating RN: 09/16/1965 (56 y.o. [name], [name] Primary Care [name]: [name] Other Clinician: Referring [name]: Treating [name]/Extender: [name]: 0 Abuse Risk Screen Items Answer ABUSE RISK SCREEN: Has anyone close to you tried to hurt or harm you recentlyo No Do you feel uncomfortable with anyone in your familyo No Has anyone forced you do things that you didnt want to doo No Electronic Signature(s) Signed: 10/18/2021 3:55:04 PM By: Lauren Pilling RN, BSN Entered By: [name] on 10/18/2021 09:54:59 -------------------------------------------------------------------------------- Activities of Daily Living Details Patient Name: Date of Service: [name], [name] 10/18/2021 9:45 A M Medical Record Number: [id number] Patient Account Number: [id number] Date of Birth/Sex: Treating RN: 03-05-1965 (56 y.o. [name] Primary Care Thai Hemrick: [name] Other Clinician: Referring [name]: Treating [name]/Extender: [name]: 0 Activities of Daily Living Items Answer Activities of Daily Living (Please select one for each item) Drive Automobile Completely Able T Medications ake Completely Able Use T elephone Completely Able Care for Appearance Completely Able Use T oilet Completely Able Bath / Shower Completely Able Dress Self Completely Able Feed Self Completely Able Walk Completely Able Get In / Out Bed Completely Able Housework Completely Able Prepare Meals Completely [name] Completely Able Shop for Self Completely Able Electronic Signature(s) Signed: 10/18/2021 3:55:04 PM By: Lauren Pilling RN, BSN Entered By:  [name] on 10/18/2021 09:55:16 -------------------------------------------------------------------------------- Education Screening Details Patient Name: Date of Service: [name], [name] 10/18/2021 9:45 A M Medical Record Number: [id number] Patient Account Number: [id number] Date of Birth/Sex: Treating RN: [birth year] (56 y.o. [name], [name] Primary Care [name]: [name] Other Clinician: Referring [name]: Treating [name] Salome/Extender: [name] in [name]: 0 Primary Learner Assessed: Patient Learning Preferences/Education Level/Primary Language Learning Preference: Explanation, Demonstration, Printed Material Highest Education Level: College or Above Preferred Language: English Cognitive Barrier Language Barrier: No Translator Needed: No Memory Deficit: No Emotional Barrier: No Cultural/Religious Beliefs Affecting Medical Care: No Physical Barrier Impaired Vision: Yes Glasses, Contacts Impaired Hearing: No Decreased Hand dexterity: No Knowledge/Comprehension Knowledge Level: High Comprehension Level: High Ability to understand written instructions: High Ability to understand verbal instructions: High Motivation Anxiety Level: Calm Cooperation: Cooperative Education Importance: Acknowledges Need Interest in Health Problems: Asks Questions Perception: Coherent Willingness to Engage in Self-Management High Activities: Readiness to Engage in Self-Management High Activities: Electronic Signature(s) Signed: 10/18/2021 3:55:04 PM By: Lauren Pilling RN, BSN Entered By: [name] on 10/18/2021 09:55:44 -------------------------------------------------------------------------------- Fall Risk Assessment Details Patient Name: Date of Service: [name] 10/18/2021 9:45 A M Medical Record Number: [id number] Patient Account Number: [id number] Date of Birth/Sex: Treating RN: 11/29/1965 (56 y.o. [name],  [name] Primary Care [name]: [name] Other Clinician: Referring [name]: Treating [name]/Extender: [name]: 0 Fall Risk Assessment Items Have you had 2 or more falls in the last 12 monthso 0 No Have you had any fall that resulted in injury in the last 12 monthso 0 No FALLS RISK SCREEN History of falling - immediate or within 3 months 0 No Secondary diagnosis (Do you have 2 or more medical diagnoseso) 0 No Ambulatory aid None/bed rest/wheelchair/nurse 0 No Crutches/cane/walker 15 Yes Furniture 0 No Intravenous therapy Access/Saline/Heparin Lock 0 No Gait/Transferring Normal/ bed rest/ wheelchair 0 No Weak (  short steps with or without shuffle, stooped but able to lift head while walking, may seek 10 Yes support from furniture) Impaired (short steps with shuffle, may have difficulty arising from chair, head down, impaired 0 No balance) Mental Status Oriented to own ability 0 Yes Electronic Signature(s) Signed: 10/18/2021 3:55:04 PM By: Lauren Pilling RN, BSN Entered By: [name] on 10/18/2021 09:55:55 -------------------------------------------------------------------------------- Foot Assessment Details Patient Name: Date of Service: [name] 10/18/2021 9:45 A M Medical Record Number: [id number] Patient Account Number: [id number] Date of Birth/Sex: Treating RN: [birth year] (56 y.o. [name] Primary Care Landy Mace: [name] Other Clinician: Referring [name]: Treating [name]/Extender: [name]: 0 Foot Assessment Items Site Locations + = Sensation present, - = Sensation absent, C = Callus, U = Ulcer R = Redness, W = Warmth, M = Maceration, PU = Pre-ulcerative lesion F = Fissure, S = Swelling, D = Dryness Assessment Right: Left: Other Deformity: No No Prior Foot Ulcer: No No Prior Amputation: No No Charcot Joint: No No Ambulatory Status:  Ambulatory With Help Assistance Device: Walker GaitEnergy manager) Signed: 10/18/2021 3:55:04 PM By: Lauren Pilling RN, BSN Entered By: [name] on 10/18/2021 10:04:08 -------------------------------------------------------------------------------- Nutrition Risk Screening Details Patient Name: Date of Service: [name], [name] 10/18/2021 9:45 A M Medical Record Number: [id number] Patient Account Number: [id number] Date of Birth/Sex: Treating RN: April 14, 1965 (56 y.o. [name], [name] Primary Care Laylani Pudwill: [name] Other Clinician: Referring Leviathan Macera: Treating Bijan Ridgley/Extender: [name]: 0 Height (in): 72 Weight (lbs): 359 Body Mass Index (BMI): 48.7 Nutrition Risk Screening Items Score Screening NUTRITION RISK SCREEN: I have an illness or condition that made me change the kind and/or amount of food I eat 2 Yes I eat fewer than two meals per day 0 No I eat few fruits and vegetables, or milk products 0 No I have three or more drinks of beer, liquor or wine almost every day 0 No I have tooth or mouth problems that make it hard for me to eat 0 No I don't always have enough money to buy the food I need 0 No I eat alone most of the time 0 No I take three or more different prescribed or over-the-counter drugs a day 1 Yes Without wanting to, I have lost or gained 10 pounds in the last six months 0 No I am not always physically able to shop, cook and/or feed myself 0 No Nutrition Protocols Good Risk Protocol Moderate Risk Protocol 0 Provide education on nutrition High Risk Proctocol Risk Level: Moderate Risk Score: 3 Electronic Signature(s) Signed: 10/18/2021 3:55:04 PM By: Lauren Pilling RN, BSN Entered By: [name] on 10/18/2021 09:56:01

## 2021-10-18 NOTE — Progress Notes (Signed)
[name], [name] ([id number]) Visit Report for 10/18/2021 Chief Complaint Document Details Patient Name: Date of Service: [name] 10/18/2021 9:45 A M Medical Record Number: [id number] Patient Account Number: [id number] Date of Birth/Sex: Treating RN: March 23, 1965 (56 y.o. F) Primary Care Provider: [name] Other Clinician: Referring Provider: Treating Provider/Extender: Mack Hook Weeks in Treatment: 0 Information Obtained from: Patient Chief Complaint 05/26/2021; Left lower extremity wound Electronic Signature(s) Signed: 10/18/2021 10:47:14 AM By: Kalman Shan DO Entered By: Kalman Shan on 10/18/2021 10:36:00 -------------------------------------------------------------------------------- HPI Details Patient Name: Date of Service: [name] 10/18/2021 9:45 A M Medical Record Number: [id number] Patient Account Number: [id number] Date of Birth/Sex: Treating RN: April 05, 1965 (56 y.o. F) Primary Care Provider: [name] Other Clinician: Referring Provider: Treating Provider/Extender: Mack Hook Weeks in Treatment: 0 History of Present Illness HPI Description: Admission 05/26/2021 Ms. Vaness Jelinski is a 56 year old female with a past medical history of lymphedema That presents to the clinic for a 54-monthhistory of nonhealing ulcer to the posterior left leg. She has not been using any dressing to the wound bed. She has never experienced wounds before. She states that she sleeps in a recliner with her feet down. She has lymphedema pumps however has not been using them due to the wound and the increased swelling to her legs. She currently denies signs of infection. 4/4; patient presents for follow-up. She states that the wrap slid down after a couple days. She states she tolerated it well prior to her coming loose. She denies signs of infection. 4/11 comes in today with a substantial wound on her left  posterior calf. This is superficial in the setting of severe lymphedema. We have been using 3 layer compression. She had a PCR culture done last week which showed extremely high titers of Morganella but also high titers of Enterobacter and E. coli. [facility] The patient is not systemically unwell no fever no chills. Our intake nurse noted very significant odor however 4/18; patient presents for follow-up. [facility] and [facility] today. She was started on ciprofloxacin at last clinic visit but states this caused her nausea and vomiting and she stopped. She denies systemic signs of infection. She tolerated the 4-layer compression wrap well. She has no issues or complaints today. 4/25; patient presents for follow-up. She has been tolerating the 4-layer compression wrap well to the left lower extremity. Unfortunately she has a new wound to the right lower extremity. 5/2; patient presents for follow-up. She has no issues or complaints today. She tolerated the 4-layer compression wraps well. 5/9; patient presents for follow-up. She states she has an appointment to get her [facility] She has no issues or complaints today. Readmission 10/18/2021 Patient states that 1-2 month ago she noticed some drainage from her posterior left leg. She been keeping the area covered. She has lymphedema pumps but states she does not use these daily. She [facility] to have custom compression garments made. Due to cost she is unable to obtain these. She currently denies signs of infection. Electronic Signature(s) Signed: 10/18/2021 10:47:14 AM By: HKalman ShanDO Entered By: HKalman Shanon 10/18/2021 10:39:08 -------------------------------------------------------------------------------- Physical Exam Details Patient Name: Date of Service: [name], [name]8/22/2023 9:45 A M Medical Record Number: [id number] Account Number: [id number] of Birth/Sex: Treating RN: [birth year](56y.o.  F) Primary Care Provider: [name] Other Clinician: Referring Provider: Treating Provider/Extender: Mack Hook Weeks in Treatment: 0 Constitutional respirations regular, non-labored and within target range for patient.. Cardiovascular 2+ dorsalis pedis/posterior tibialis pulses. Psychiatric pleasant and cooperative. Notes Left lower extremity: Significant lymphedema. Drainage and breakdown to the skin to the posterior distal aspect. No signs of surrounding infection. Electronic Signature(s) Signed: 10/18/2021 10:47:14 AM By: Kalman Shan DO Entered By: Kalman Shan on 10/18/2021 10:40:08 -------------------------------------------------------------------------------- Physician Orders Details Patient Name: Date of Service: [name], [name] 10/18/2021 9:45 A M Medical Record Number: [id number] Patient Account Number: [id number] Date of Birth/Sex: Treating RN: 1965/09/29 (56 y.o. [name], [name] Primary Care Provider: [name] Other Clinician: Referring Provider: Treating Provider/Extender: Mack Hook Weeks in Treatment: 0 Verbal / Phone Orders: No Diagnosis Coding ICD-10 Coding Code Description [phone] Non-pressure chronic ulcer of other part of left lower leg with fat layer exposed L97.812 Non-pressure chronic ulcer of other part of right lower leg with fat layer exposed I89.0 Lymphedema, not elsewhere classified I87.313 Chronic venous hypertension (idiopathic) with ulcer of bilateral lower extremity Follow-up Appointments ppointment in 1 week. - 1115am 10/25/2021 Tuesday Dr. Heber [place] and [name], Room 8 Return A Anesthetic (In clinic) Topical Lidocaine 5% applied to wound bed Bathing/ Shower/ Hygiene May shower with protection but do not get wound dressing(s)  wet. Edema Control - Lymphedema / SCD / Other Lymphedema Pumps. Use Lymphedema pumps on leg(s) 2-3 times a day for 45-60 minutes. If wearing any wraps or hose, do not remove them. Continue exercising as instructed. - 2-3 times a day for an hour each time. Elevate legs to the level of the heart or above for 30 minutes daily and/or when sitting, a frequency of: - 3-4 times a day throughout the day. Avoid standing for long periods of time. Patient to wear own compression stockings every day. - right leg apply in the morning and remove at night. Exercise regularly Home Health Wound #3 Left,Posterior Lower Leg New wound care orders this week; continue Home Health for wound care. May utilize formulary equivalent dressing for wound treatment orders unless otherwise specified. - home health to apply once a week and wound center weekly. Other Home Health Orders/Instructions: - [place] Wound Treatment Wound #3 - Lower Leg Wound Laterality: Left, Posterior Cleanser: Soap and Water (Home Health) 2 x Per Week/30 Days Discharge Instructions: May shower and wash wound with dial antibacterial soap and water prior to dressing change. Cleanser: Wound Cleanser (Home Health) 2 x Per Week/30 Days Discharge Instructions: Cleanse the wound with wound cleanser prior to applying a clean dressing using gauze sponges, not tissue or cotton balls. Peri-Wound Care: Sween Lotion (Moisturizing lotion) (Home Health) 2 x Per Week/30 Days Discharge Instructions: Apply moisturizing lotion as directed Topical: topical compounding antibiotics (Home Health) 2 x Per Week/30 Days Discharge Instructions: Apply directly to wound bed. Prim Dressing: KerraCel Ag Gelling Fiber Dressing, 4x5 in (silver alginate) (Home Health) 2 x Per Week/30 Days ary Discharge Instructions: Apply silver alginate over the topical antibiotics. Secondary Dressing: ABD Pad, 8x10 (Home Health) 2 x Per Week/30 Days Discharge Instructions: Apply  over primary dressing as directed. Secondary Dressing: Woven Gauze Sponge, Non-Sterile 4x4 in (Home Health) 2 x Per Week/30 Days Discharge Instructions: Apply over primary dressing as directed. Compression Wrap: FourPress (4 layer compression wrap) (Home Health) 2 x Per Week/30 Days Discharge Instructions: Apply four layer compression as directed. May also use Miliken CoFlex 2 layer compression system as alternative.  Patient Medications llergies: abatacept, bee venom protein (honey bee), latex, Augmentin, Nulytely, Cipro A Notifications Medication Indication Start End 10/18/2021 lidocaine DOSE topical 5 % gel - gel topical apply only in clinic for debridements. Electronic Signature(s) Signed: 10/18/2021 10:47:14 AM By: Kalman Shan DO Entered By: Kalman Shan on 10/18/2021 10:40:16 Prescription 10/18/2021 -------------------------------------------------------------------------------- Vivien Rota DO Patient Name: Provider: [birth year] [id number] Date of Birth: NPI#Wanda Plump [id number] Sex: DEA #: [id number] [phone] Phone #: License #: [employer] Patient Address: [place] [place], [place] [address] [address], [place] [address] [phone] Allergies abatacept; bee venom protein (honey bee); latex; Augmentin; Nulytely; Cipro Medication Medication: Route: Strength: Form: lidocaine 5 % topical gel topical 5% gel Class: TOPICAL LOCAL ANESTHETICS Dose: Frequency / Time: Indication: gel topical apply only in clinic for debridements. Number of Refills: Number of Units: 0 Generic Substitution: Start Date: End Date: One Time Use: Substitution Permitted 0/11/2723 No Note to Pharmacy: Hand Signature: Date(s): Electronic Signature(s) Signed: 10/18/2021 10:47:14 AM By: Kalman Shan DO Entered By: Kalman Shan on 10/18/2021  10:40:16 -------------------------------------------------------------------------------- Problem List Details Patient Name: Date of Service: [name] 10/18/2021 9:45 A M Medical Record Number: [id number] Patient Account Number: [id number] Date of Birth/Sex: Treating RN: 11/29/1965 (56 y.o. F) Primary Care Provider: [name] Other Clinician: Referring Provider: Treating Provider/Extender: Mack Hook Weeks in Treatment: 0 Active Problems ICD-10 Encounter Code Description Active Date MDM Diagnosis L97.822 Non-pressure chronic ulcer of other part of left lower leg with fat layer exposed8/22/2023 No Yes I89.0 Lymphedema, not elsewhere classified 10/18/2021 No Yes I87.312 Chronic venous hypertension (idiopathic) with ulcer of left lower extremity 10/18/2021 No Yes Inactive Problems Resolved Problems Electronic Signature(s) Signed: 10/18/2021 10:47:14 AM By: Kalman Shan DO Entered By: Kalman Shan on 10/18/2021 10:35:44 -------------------------------------------------------------------------------- Progress Note Details Patient Name: Date of Service: [name] 10/18/2021 9:45 A M Medical Record Number: [id number] Patient Account Number: [id number] Date of Birth/Sex: Treating RN: 11-04-1965 (56 y.o. F) Primary Care Provider: [name] Other Clinician: Referring Provider: Treating Provider/Extender: Mack Hook Weeks in Treatment: 0 Subjective Chief Complaint Information obtained from Patient 05/26/2021; Left lower extremity wound History of Present Illness (HPI) Admission 05/26/2021 Ms. Thurley Francesconi is a 56 year old female with a past medical history of lymphedema That presents to the clinic for a 32-monthhistory of nonhealing ulcer to the posterior left leg. She has not been using any dressing to the wound bed. She has never experienced wounds before. She states that she sleeps in a recliner with  her feet down. She has lymphedema pumps however has not been using them due to the wound and the increased swelling to her legs. She currently denies signs of infection. 4/4; patient presents for follow-up. She states that the wrap slid down after a couple days. She states she tolerated it well prior to her coming loose. She denies signs of infection. 4/11 comes in today with a substantial wound on her left posterior calf. This is superficial in the setting of severe lymphedema. We have been using 3 layer compression. She had a PCR culture done last week which showed extremely high titers of Morganella but also high titers of Enterobacter and E. coli. [facility] The patient is not systemically unwell no fever no chills. Our intake nurse noted very significant odor however 4/18; patient presents for follow-up. She was able to  obtain her [employer] antibiotics and brought this in today. She was started on ciprofloxacin at last clinic visit but states this caused her nausea and vomiting and she stopped. She denies systemic signs of infection. She tolerated the 4-layer compression wrap well. She has no issues or complaints today. 4/25; patient presents for follow-up. She has been tolerating the 4-layer compression wrap well to the left lower extremity. Unfortunately she has a new wound to the right lower extremity. 5/2; patient presents for follow-up. She has no issues or complaints today. She tolerated the 4-layer compression wraps well. 5/9; patient presents for follow-up. She states she has an appointment to get her stockings fitted at [facility] She has no issues or complaints today. Readmission 10/18/2021 Patient states that 1-2 month ago she noticed some drainage from her posterior left leg. She been keeping the area covered. She has lymphedema pumps but states she does not use these daily. She did go to " a special  place" to have custom compression garments made. Due to cost she is unable to obtain these. She currently denies signs of infection. Patient History Information obtained from Patient, Chart. Allergies abatacept (Severity: Severe, Reaction: Anaphylaxis), bee venom protein (honey bee) (Severity: Severe, Reaction: Anaphylaxis), latex, Augmentin (Reaction: Hives, Itching), Nulytely (Reaction: N/V), Cipro (Reaction: N/V) Family History Cancer - Father, Diabetes - Maternal Grandparents, Heart Disease - Mother,Siblings, Hypertension - Mother,Siblings, Lung Disease - Father, No family history of Hereditary Spherocytosis, Kidney Disease, Seizures, Stroke, Thyroid Problems, Tuberculosis. Social History Never smoker, Marital Status - Single, Alcohol Use - Rarely, Drug Use - No History, Caffeine Use - Daily - Tea. Medical History Eyes Patient has history of Cataracts Hematologic/Lymphatic Patient has history of Anemia, Lymphedema - Since 2019 Respiratory Patient has history of Asthma Cardiovascular Patient has history of Deep Vein Thrombosis, Hypertension Musculoskeletal Patient has history of Osteoarthritis Medical A Surgical History Notes nd Respiratory Hx of PE Integumentary (Skin) Keloid to Right Neck Psychiatric depression Objective Constitutional respirations regular, non-labored and within target range for patient.. Vitals Time Taken: 9:50 AM, Height: 72 in, Source: Stated, Weight: 359 lbs, Source: Stated, BMI: 48.7, Temperature: 98.2 F, Pulse: 102 bpm, Respiratory Rate: 22 breaths/min, Blood Pressure: 171/96 mmHg. Cardiovascular 2+ dorsalis pedis/posterior tibialis pulses. Psychiatric pleasant and cooperative. General Notes: Left lower extremity: Significant lymphedema. Drainage and breakdown to the skin to the posterior distal aspect. No signs of surrounding infection. Integumentary (Hair, Skin) Wound #3 status is Open. Original cause of wound was Gradually Appeared. The  date acquired was: 08/17/2021. The wound is located on the Left,Posterior Lower Leg. The wound measures 9cm length x 4cm width x 0.1cm depth; 28.274cm^2 area and 2.827cm^3 volume. There is Fat Layer (Subcutaneous Tissue) exposed. There is no tunneling or undermining noted. There is a medium amount of serosanguineous drainage noted. The wound margin is distinct with the outline attached to the wound base. There is small (1-33%) red, pink granulation within the wound bed. There is a large (67-100%) amount of necrotic tissue within the wound bed including Adherent Slough. Assessment Active Problems ICD-10 Non-pressure chronic ulcer of other part of left lower leg with fat layer exposed Lymphedema, not elsewhere classified Chronic venous hypertension (idiopathic) with ulcer of left lower extremity Patient presents with skin breakdown and weeping to the posterior distal left lower extremity secondary to lymphedema. No signs of surrounding infection. She has been seen for this issue in the past in our clinic and we use Keystone topical antibiotic, silver alginate under 4-layer compression with improvement.  We will continue this therapy today. Follow-up in 1 week. She has home health that will change the dressing once weekly as well. Procedures Wound #3 Pre-procedure diagnosis of Wound #3 is a Lymphedema located on the Left,Posterior Lower Leg . There was a Four Layer Compression Therapy Procedure by Deon Pilling, RN. Post procedure Diagnosis Wound #3: Same as Pre-Procedure Plan Follow-up Appointments: Return Appointment in 1 week. - 1115am 10/25/2021 Tuesday Dr. Heber [place] and [name], Room 8 Anesthetic: (In clinic) Topical Lidocaine 5% applied to wound bed Bathing/ Shower/ Hygiene: May shower with protection but do not get wound dressing(s) wet. Edema Control - Lymphedema / SCD / Other: Lymphedema Pumps. Use Lymphedema pumps on leg(s) 2-3 times a day for 45-60 minutes. If wearing any wraps or hose,  do not remove them. Continue exercising as instructed. - 2-3 times a day for an hour each time. Elevate legs to the level of the heart or above for 30 minutes daily and/or when sitting, a frequency of: - 3-4 times a day throughout the day. Avoid standing for long periods of time. Patient to wear own compression stockings every day. - right leg apply in the morning and remove at night. Exercise regularly Home Health: Wound #3 Left,Posterior Lower Leg: New wound care orders this week; continue Home Health for wound care. May utilize formulary equivalent dressing for wound treatment orders unless otherwise specified. - home health to apply once a week and wound center weekly. Other Home Health Orders/Instructions: - [place] The following medication(s) was prescribed: lidocaine topical 5 % gel gel topical apply only in clinic for debridements. was prescribed at facility WOUND #3: - Lower Leg Wound Laterality: Left, Posterior Cleanser: Soap and Water (Home Health) 2 x Per Week/30 Days Discharge Instructions: May shower and wash wound with dial antibacterial soap and water prior to dressing change. Cleanser: Wound Cleanser (Home Health) 2 x Per Week/30 Days Discharge Instructions: Cleanse the wound with wound cleanser prior to applying a clean dressing using gauze sponges, not tissue or cotton balls. Peri-Wound Care: Sween Lotion (Moisturizing lotion) (Home Health) 2 x Per Week/30 Days Discharge Instructions: Apply moisturizing lotion as directed Topical: topical compounding antibiotics (Home Health) 2 x Per Week/30 Days Discharge Instructions: Apply directly to wound bed. Prim Dressing: KerraCel Ag Gelling Fiber Dressing, 4x5 in (silver alginate) (Home Health) 2 x Per Week/30 Days ary Discharge Instructions: Apply silver alginate over the topical antibiotics. Secondary Dressing: ABD Pad, 8x10 (Home Health) 2 x Per Week/30 Days Discharge Instructions: Apply over primary dressing as  directed. Secondary Dressing: Woven Gauze Sponge, Non-Sterile 4x4 in (Home Health) 2 x Per Week/30 Days Discharge Instructions: Apply over primary dressing as directed. Com pression Wrap: FourPress (4 layer compression wrap) (Home Health) 2 x Per Week/30 Days Discharge Instructions: Apply four layer compression as directed. May also use Miliken CoFlex 2 layer compression system as alternative. 1. Keystone antibiotics with silver alginate under 4-layer compression 2. Follow-up in 1 week Electronic Signature(s) Signed: 10/18/2021 10:47:14 AM By: Kalman Shan DO Entered By: Kalman Shan on 10/18/2021 10:41:15 -------------------------------------------------------------------------------- HxROS Details Patient Name: Date of Service: [name] 10/18/2021 9:45 A M Medical Record Number: [id number] Patient Account Number: [id number] Date of Birth/Sex: Treating RN: Sep 18, 1965 (56 y.o. [name] Primary Care Provider: [name] Other Clinician: Referring Provider: Treating Provider/Extender: Mack Hook Weeks in Treatment: 0 Information Obtained From Patient Chart Eyes Medical History: Positive for: Cataracts Hematologic/Lymphatic Medical History: Positive for: Anemia; Lymphedema - Since 2019 Respiratory Medical History:  Positive for: Asthma Past Medical History Notes: Hx of PE Cardiovascular Medical History: Positive for: Deep Vein Thrombosis; Hypertension Integumentary (Skin) Medical History: Past Medical History Notes: Keloid to Right Neck Musculoskeletal Medical History: Positive for: Osteoarthritis Psychiatric Medical History: Past Medical History Notes: depression HBO Extended History Items Eyes: Cataracts Immunizations Pneumococcal Vaccine: Received Pneumococcal Vaccination: Yes Received Pneumococcal Vaccination On or After 60th Birthday: No Implantable Devices None Family and Social History Cancer: Yes - Father;  Diabetes: Yes - Maternal Grandparents; Heart Disease: Yes - Mother,Siblings; Hereditary Spherocytosis: No; Hypertension: Yes - Mother,Siblings; Kidney Disease: No; Lung Disease: Yes - Father; Seizures: No; Stroke: No; Thyroid Problems: No; Tuberculosis: No; Never smoker; Marital Status - Single; Alcohol Use: Rarely; Drug Use: No History; Caffeine Use: Daily - T Financial Concerns: No; Food, Clothing or Shelter Needs: No; Support ea; System Lacking: No; Transportation Concerns: No Electronic Signature(s) Signed: 10/18/2021 10:47:14 AM By: Kalman Shan DO Signed: 10/18/2021 3:55:04 PM By: Deon Pilling RN, BSN Entered By: Deon Pilling on 10/17/2021 16:09:41 -------------------------------------------------------------------------------- SuperBill Details Patient Name: Date of Service: JAZMEN, LINDENBAUM 10/18/2021 Medical Record Number: [id number] Patient Account Number: [id number] Date of Birth/Sex: Treating RN: 1965-03-27 (56 y.o. 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 with fat layer exposed I89.0 Lymphedema, not elsewhere classified I87.312 Chronic venous hypertension (idiopathic) with ulcer of left lower extremity Facility Procedures CPT4 Code: [id number] Description: [place] VISIT-LEV 3 EST PT Modifier: Quantity: 1 CPT4 Code: [id number] Description: (Facility Use Only) 29581LT - [place] LWR LT LEG Modifier: Quantity: 1 Physician Procedures : CPT4 Code Description Modifier [id number] - WC PHYS LEVEL 3 - EST PT ICD-10 Diagnosis Description L97.822 Non-pressure chronic ulcer of other part of left lower leg with fat layer exposed I87.312 Chronic venous hypertension (idiopathic) with ulcer  of left lower  extremity I89.0 Lymphedema, not elsewhere classified Quantity: 1 Electronic Signature(s) Signed: 10/18/2021 10:47:14 AM By: Kalman Shan DO Entered By: Kalman Shan on 10/18/2021 10:41:49

## 2021-10-18 NOTE — Progress Notes (Signed)
EARNESTINE, SHIPP ([id number]) Visit Report for 10/18/2021 Allergy List Details Patient Name: Date of Service: CHEYRL, BULEY 10/18/2021 9:45 A M Medical Record Number: [id number] Patient Account Number: [id number] Date of Birth/Sex: Treating RN: [birth year] (56 y.o. [name], Tammi Klippel Primary Care Valera Vallas: [name] Other Clinician: Referring Chaynce Schafer: Treating Mack Alvidrez/Extender: Mack Hook Weeks in Treatment: 0 Allergies Active Allergies abatacept Reaction: Anaphylaxis Severity: Severe bee venom protein (honey bee) Reaction: Anaphylaxis Severity: Severe latex Augmentin Reaction: Hives, Itching Nulytely Reaction: N/V Cipro Reaction: N/V Allergy Notes Electronic Signature(s) Signed: 10/18/2021 3:55:04 PM By: Deon Pilling RN, BSN Entered By: Deon Pilling on 10/18/2021 09:53:36 -------------------------------------------------------------------------------- Arrival Information Details Patient Name: Date of Service: [name] 10/18/2021 9:45 A M Medical Record Number: [id number] Patient Account Number: [id number] Date of Birth/Sex: Treating RN: 09-01-1965 (56 y.o. [name], [name] Primary Care Angelino Rumery: [name] Other Clinician: Referring Kaleiah Kutzer: Treating Dakai Braithwaite/Extender: Mack Hook Weeks in Treatment: 0 Visit Information Patient Arrived: Walker Arrival Time: 09:50 Accompanied By: self Transfer Assistance: None Patient Identification Verified: Yes Secondary Verification Process Completed: Yes Patient Requires Transmission-Based Precautions: No Patient Has Alerts: Yes Patient Alerts: Patient on Blood Thinner Patient Alerts: Patient on Blood Thinner History Since Last Visit Added or deleted any medications: Yes Any new allergies or adverse reactions: No Had a fall or experienced change in activities of daily living that may affect risk of falls: No Signs or symptoms of abuse/neglect since last  visito No Hospitalized since last visit: No Implantable device outside of the clinic excluding cellular tissue based products placed in the center since last visit: No Pain Present Now: No Electronic Signature(s) Signed: 10/18/2021 3:55:04 PM By: Deon Pilling RN, BSN Entered By: Deon Pilling on 10/18/2021 09:52:30 -------------------------------------------------------------------------------- Clinic Level of Care Assessment Details Patient Name: Date of Service: ALAYZA, PIEPER 10/18/2021 9:45 A M Medical Record Number: [id number] Patient Account Number: [id number] Date of Birth/Sex: Treating RN: 1966-02-16 (56 y.o. [name] Primary Care Takelia Urieta: [name] Other Clinician: Referring Henrine Hayter: Treating Jahziah Simonin/Extender: Mack Hook Weeks in Treatment: 0 Clinic Level of Care Assessment Items TOOL 1 Quantity Score X- 1 0 Use when EandM and Procedure is performed on INITIAL visit ASSESSMENTS - Nursing Assessment / Reassessment X- 1 20 General Physical Exam (combine w/ comprehensive assessment (listed just below) when performed on new pt. evals) X- 1 25 Comprehensive Assessment (HX, ROS, Risk Assessments, Wounds Hx, etc.) ASSESSMENTS - Wound and Skin Assessment / Reassessment X- 1 10 Dermatologic / Skin Assessment (not related to wound area) ASSESSMENTS - Ostomy and/or Continence Assessment and Care '[]'$  - 0 Incontinence Assessment and Management '[]'$  - 0 Ostomy Care Assessment and Management (repouching, etc.) PROCESS - Coordination of Care '[]'$  - 0 Simple Patient / Family Education for ongoing care X- 1 20 Complex (extensive) Patient / Family Education for ongoing care X- 1 10 Staff obtains Programmer, systems, Records, T Results / Process Orders est X- 1 10 Staff telephones HHA, Nursing Homes / Clarify orders / etc '[]'$  - 0 Routine Transfer to another Facility (non-emergent condition) '[]'$  - 0 Routine Hospital Admission (non-emergent condition) X- 1  15 New Admissions / Biomedical engineer / Ordering NPWT Apligraf, etc. , '[]'$  - 0 Emergency Hospital Admission (emergent condition) PROCESS - Special Needs '[]'$  - 0 Pediatric / Minor Patient Management '[]'$  - 0 Isolation Patient Management '[]'$  - 0 Hearing / Language / Visual special needs '[]'$  - 0 Assessment of Community assistance (transportation, D/C planning, etc.) '[]'$  - 0 Additional assistance /  Altered mentation '[]'$  - 0 Support Surface(s) Assessment (bed, cushion, seat, etc.) INTERVENTIONS - Miscellaneous '[]'$  - 0 External ear exam '[]'$  - 0 Patient Transfer (multiple staff / Civil Service fast streamer / Similar devices) '[]'$  - 0 Simple Staple / Suture removal (25 or less) '[]'$  - 0 Complex Staple / Suture removal (26 or more) '[]'$  - 0 Hypo/Hyperglycemic Management (do not check if billed separately) '[]'$  - 0 Ankle / Brachial Index (ABI) - do not check if billed separately Has the patient been seen at the hospital within the last three years: Yes Total Score: 110 Level Of Care: New/Established - Level 3 Electronic Signature(s) Signed: 10/18/2021 3:55:04 PM By: Deon Pilling RN, BSN Entered By: Deon Pilling on 10/18/2021 10:10:48 -------------------------------------------------------------------------------- Compression Therapy Details Patient Name: Date of Service: [name] 10/18/2021 9:45 A M Medical Record Number: [id number] Patient Account Number: [id number] Date of Birth/Sex: Treating RN: 1965-12-29 (55 y.o. [name] Primary Care Paitlyn Mcclatchey: [name] Other Clinician: Referring Haivyn Oravec: Treating Jaycion Treml/Extender: Mack Hook Weeks in Treatment: 0 Compression Therapy Performed for Wound Assessment: Wound #3 Left,Posterior Lower Leg Performed By: Clinician Deon Pilling, RN Compression Type: Four Layer Post Procedure Diagnosis Same as Pre-procedure Electronic Signature(s) Signed: 10/18/2021 3:55:04 PM By: Deon Pilling RN, BSN Entered By: Deon Pilling on 10/18/2021 10:11:00 -------------------------------------------------------------------------------- Encounter Discharge Information Details Patient Name: Date of Service: [name] 10/18/2021 9:45 A M Medical Record Number: [id number] Patient Account Number: [id number] Date of Birth/Sex: Treating RN: 1966-02-13 (56 y.o. [name] Primary Care Bethanny Toelle: [name] Other Clinician: Referring Roux Brandy: Treating Michaela Broski/Extender: Mack Hook Weeks in Treatment: 0 Encounter Discharge Information Items Discharge Condition: Stable Ambulatory Status: Walker Discharge Destination: Home Transportation: Private Auto Accompanied By: self Schedule Follow-up Appointment: Yes Clinical Summary of Care: Electronic Signature(s) Signed: 10/18/2021 3:55:04 PM By: Deon Pilling RN, BSN Entered By: Deon Pilling on 10/18/2021 10:29:16 -------------------------------------------------------------------------------- Lower Extremity Assessment Details Patient Name: Date of Service: HARUNA, ROHLFS 10/18/2021 9:45 A M Medical Record Number: [id number] Patient Account Number: [id number] Date of Birth/Sex: Treating RN: Nov 01, 1965 (56 y.o. [name] Primary Care Jamere Stidham: [name] Other Clinician: Referring Novak Stgermaine: Treating Rubye Strohmeyer/Extender: Mack Hook Weeks in Treatment: 0 Edema Assessment Assessed: [Left: Yes] [Right: No] Edema: [Left: Ye] [Right: s] Calf Left: Right: Point of Measurement: 31 cm From Medial Instep 76 cm Ankle Left: Right: Point of Measurement: 12 cm From Medial Instep 52 cm Knee To Floor Left: Right: From Medial Instep 48 cm Vascular Assessment Pulses: Dorsalis Pedis Palpable: [Left:No] Posterior Tibial Palpable: [Left:No] Electronic Signature(s) Signed: 10/18/2021 3:55:04 PM By: Deon Pilling RN, BSN Entered By: Deon Pilling on 10/18/2021  10:03:37 -------------------------------------------------------------------------------- Multi Wound Chart Details Patient Name: Date of Service: [name] 10/18/2021 9:45 A M Medical Record Number: [id number] Patient Account Number: [id number] Date of Birth/Sex: Treating RN: 12/04/1965 (56 y.o. F) Primary Care Athony Coppa: [name] Other Clinician: Referring Gaye Scorza: Treating Emali Heyward/Extender: Mack Hook Weeks in Treatment: 0 Vital Signs Height(in): 72 Pulse(bpm): 102 Weight(lbs): 359 Blood Pressure(mmHg): 171/96 Body Mass Index(BMI): 48.7 Temperature(F): 98.2 Respiratory Rate(breaths/min): 22 Photos: [3:Left, Posterior Lower Leg] [N/A:N/A N/A] Wound Location: [3:Gradually Appeared] [N/A:N/A] Wounding Event: [3:Lymphedema] [N/A:N/A] Primary Etiology: [3:Cataracts, Anemia, Lymphedema,] [N/A:N/A] Comorbid History: [3:Asthma, Deep Vein Thrombosis, Hypertension, Osteoarthritis 08/17/2021] [N/A:N/A] Date Acquired: [3:0] [N/A:N/A] Weeks of Treatment: [3:Open] [N/A:N/A] Wound Status: [3:No] [N/A:N/A] Wound Recurrence: [3:9x4x0.1] [N/A:N/A] Measurements L x W x D (cm) [3:28.274] [N/A:N/A] A (cm) : rea [3:2.827] [N/A:N/A] Volume (cm) : [3:Full Thickness Without  Exposed] [N/A:N/A] Classification: [3:Support Structures Medium] [N/A:N/A] Exudate Amount: [3:Serosanguineous] [N/A:N/A] Exudate Type: [3:red, brown] [N/A:N/A] Exudate Color: [3:Distinct, outline attached] [N/A:N/A] Wound Margin: [3:Small (1-33%)] [N/A:N/A] Granulation Amount: [3:Red, Pink] [N/A:N/A] Granulation Quality: [3:Large (67-100%)] [N/A:N/A] Necrotic Amount: [3:Fat Layer (Subcutaneous Tissue): Yes N/A] Exposed Structures: [3:Fascia: No Tendon: No Muscle: No Joint: No Bone: No Small (1-33%)] [N/A:N/A] Epithelialization: [3:Compression Therapy] [N/A:N/A] Treatment Notes Wound #3 (Lower Leg) Wound Laterality: Left, Posterior Cleanser Soap and Water Discharge Instruction:  May shower and wash wound with dial antibacterial soap and water prior to dressing change. Wound Cleanser Discharge Instruction: Cleanse the wound with wound cleanser prior to applying a clean dressing using gauze sponges, not tissue or cotton balls. Peri-Wound Care Sween Lotion (Moisturizing lotion) Discharge Instruction: Apply moisturizing lotion as directed Topical topical compounding antibiotics Discharge Instruction: Apply directly to wound bed. Primary Dressing KerraCel Ag Gelling Fiber Dressing, 4x5 in (silver alginate) Discharge Instruction: Apply silver alginate over the topical antibiotics. Secondary Dressing ABD Pad, 8x10 Discharge Instruction: Apply over primary dressing as directed. Woven Gauze Sponge, Non-Sterile 4x4 in Discharge Instruction: Apply over primary dressing as directed. Secured With Compression Wrap FourPress (4 layer compression wrap) Discharge Instruction: Apply four layer compression as directed. May also use Miliken CoFlex 2 layer compression system as alternative. Compression Stockings Add-Ons Electronic Signature(s) Signed: 10/18/2021 10:47:14 AM By: Kalman Shan DO Entered By: Kalman Shan on 10/18/2021 10:35:53 -------------------------------------------------------------------------------- Multi-Disciplinary Care Plan Details Patient Name: Date of Service: KASEN, SAKO 10/18/2021 9:45 A M Medical Record Number: [id number] Patient Account Number: [id number] Date of Birth/Sex: Treating RN: [birth year] (56 y.o. [name], Tammi Klippel Primary Care Lyndsey Demos: [name] Other Clinician: Referring Dasani Thurlow: Treating Addisson Frate/Extender: Mack Hook Weeks in Treatment: 0 Active Inactive Orientation to the Wound Care Program Nursing Diagnoses: Knowledge deficit related to the wound healing center program Goals: Patient/caregiver will verbalize understanding of the [facility] Program Date Initiated:  10/18/2021 Target Resolution Date: 11/05/2021 Goal Status: Active Interventions: Provide education on orientation to the wound center Notes: Venous Leg Ulcer Nursing Diagnoses: Knowledge deficit related to disease process and management Goals: Patient will maintain optimal edema control Date Initiated: 10/18/2021 Target Resolution Date: 11/03/2021 Goal Status: Active Interventions: Compression as ordered Notes: Wound/Skin Impairment Nursing Diagnoses: Knowledge deficit related to ulceration/compromised skin integrity Goals: Patient/caregiver will verbalize understanding of skin care regimen Date Initiated: 10/18/2021 Target Resolution Date: 11/02/2021 Goal Status: Active Interventions: Assess patient/caregiver ability to obtain necessary supplies Assess patient/caregiver ability to perform ulcer/skin care regimen upon admission and as needed Provide education on ulcer and skin care Treatment Activities: Skin care regimen initiated : 10/18/2021 Topical wound management initiated : 10/18/2021 Notes: Electronic Signature(s) Signed: 10/18/2021 3:55:04 PM By: Deon Pilling RN, BSN Signed: 10/18/2021 3:55:04 PM By: Deon Pilling RN, BSN Entered By: Deon Pilling on 10/18/2021 10:09:21 -------------------------------------------------------------------------------- Pain Assessment Details Patient Name: Date of Service: [name] 10/18/2021 9:45 A M Medical Record Number: [id number] Patient Account Number: [id number] Date of Birth/Sex: Treating RN: 10/28/1965 (56 y.o. [name] Primary Care Illona Bulman: [name] Other Clinician: Referring Pamla Pangle: Treating Zinnia Tindall/Extender: Mack Hook Weeks in Treatment: 0 Active Problems Location of Pain Severity and Description of Pain Patient Has Paino No Site Locations Rate the pain. Current Pain Level: 0 Pain Management and Medication Current Pain Management: Medication: No Cold Application:  No Rest: No Massage: No Activity: No T.E.N.S.: No Heat Application: No Leg drop or elevation: No Is the Current Pain Management Adequate: Adequate How does your wound impact your activities  of daily livingo Sleep: No Bathing: No Appetite: No Relationship With Others: No Bladder Continence: No Emotions: No Bowel Continence: No Work: No Toileting: No Drive: No Dressing: No Hobbies: No Engineer, maintenance) Signed: 10/18/2021 3:55:04 PM By: Deon Pilling RN, BSN Entered By: Deon Pilling on 10/18/2021 09:56:11 -------------------------------------------------------------------------------- Patient/Caregiver Education Details Patient Name: Date of Service: [name] 8/22/2023andnbsp9:45 [place] Record Number: [id number] Patient Account Number: [id number] Date of Birth/Gender: Treating RN: [birth year] (56 y.o. [name] Primary Care Physician: [name] Other Clinician: Referring Physician: Treating Physician/Extender: Lamount Cranker in Treatment: 0 Education Assessment Education Provided To: Patient Education Topics Provided Wound/Skin Impairment: Handouts: Caring for Your Ulcer, Skin Care Do's and Dont's Methods: Explain/Verbal Responses: Reinforcements needed Electronic Signature(s) Signed: 10/18/2021 3:55:04 PM By: Deon Pilling RN, BSN Entered By: Deon Pilling on 10/18/2021 10:10:00 -------------------------------------------------------------------------------- Wound Assessment Details Patient Name: Date of Service: [name] 10/18/2021 9:45 A M Medical Record Number: [id number] Patient Account Number: [id number] Date of Birth/Sex: Treating RN: 1966/02/20 (56 y.o. [name] Primary Care Geroge Gilliam: [name] Other Clinician: Referring Wesam Gearhart: Treating Cerra Eisenhower/Extender: Mack Hook Weeks in Treatment: 0 Wound Status Wound Number: 3 Primary  Lymphedema Etiology: Wound Location: Left, Posterior Lower Leg Wound Open Wounding Event: Gradually Appeared Status: Date Acquired: 08/17/2021 Comorbid Cataracts, Anemia, Lymphedema, Asthma, Deep Vein Weeks Of Treatment: 0 History: Thrombosis, Hypertension, Osteoarthritis Clustered Wound: No Photos Wound Measurements Length: (cm) 9 Width: (cm) 4 Depth: (cm) 0.1 Area: (cm) 28.274 Volume: (cm) 2.827 % Reduction in Area: % Reduction in Volume: Epithelialization: Small (1-33%) Tunneling: No Undermining: No Wound Description Classification: Full Thickness Without Exposed Support Structures Wound Margin: Distinct, outline attached Exudate Amount: Medium Exudate Type: Serosanguineous Exudate Color: red, brown Foul Odor After Cleansing: No Slough/Fibrino Yes Wound Bed Granulation Amount: Small (1-33%) Exposed Structure Granulation Quality: Red, Pink Fascia Exposed: No Necrotic Amount: Large (67-100%) Fat Layer (Subcutaneous Tissue) Exposed: Yes Necrotic Quality: Adherent Slough Tendon Exposed: No Muscle Exposed: No Joint Exposed: No Bone Exposed: No Treatment Notes Wound #3 (Lower Leg) Wound Laterality: Left, Posterior Cleanser Soap and Water Discharge Instruction: May shower and wash wound with dial antibacterial soap and water prior to dressing change. Wound Cleanser Discharge Instruction: Cleanse the wound with wound cleanser prior to applying a clean dressing using gauze sponges, not tissue or cotton balls. Peri-Wound Care Sween Lotion (Moisturizing lotion) Discharge Instruction: Apply moisturizing lotion as directed Topical topical compounding antibiotics Discharge Instruction: Apply directly to wound bed. Primary Dressing KerraCel Ag Gelling Fiber Dressing, 4x5 in (silver alginate) Discharge Instruction: Apply silver alginate over the topical antibiotics. Secondary Dressing ABD Pad, 8x10 Discharge Instruction: Apply over primary dressing as directed. Woven  Gauze Sponge, Non-Sterile 4x4 in Discharge Instruction: Apply over primary dressing as directed. Secured With Compression Wrap FourPress (4 layer compression wrap) Discharge Instruction: Apply four layer compression as directed. May also use Miliken CoFlex 2 layer compression system as alternative. Compression Stockings Add-Ons Electronic Signature(s) Signed: 10/18/2021 3:55:04 PM By: Deon Pilling RN, BSN Entered By: Deon Pilling on 10/18/2021 10:02:53 -------------------------------------------------------------------------------- Vitals Details Patient Name: Date of Service: [name] 10/18/2021 9:45 A M Medical Record Number: [id number] Patient Account Number: [id number] Date of Birth/Sex: Treating RN: 03/25/1965 (56 y.o. [name] Primary Care Marquitta Persichetti: [name] Other Clinician: Referring Jonerik Sliker: Treating Fredonia Casalino/Extender: Mack Hook Weeks in Treatment: 0 Vital Signs Time Taken: 09:50 Temperature (F): 98.2 Height (in): 72 Pulse (bpm): 102 Source: Stated Respiratory Rate (breaths/min): 22 Weight (lbs): 359  Blood Pressure (mmHg): 171/96 Source: Stated Reference Range: 80 - 120 mg / dl Body Mass Index (BMI): 48.7 Electronic Signature(s) Signed: 10/18/2021 3:55:04 PM By: Deon Pilling RN, BSN Entered By: Deon Pilling on 10/18/2021 09:53:05

## 2021-10-20 ENCOUNTER — Telehealth: Payer: Self-pay

## 2021-10-20 NOTE — Telephone Encounter (Signed)
Demetria with [facility] calling for diagnosis for Imdur and Metoprolol. Per Cardiology note, chest pain is listed.

## 2021-10-25 ENCOUNTER — Encounter (HOSPITAL_BASED_OUTPATIENT_CLINIC_OR_DEPARTMENT_OTHER): Payer: Medicare HMO | Admitting: Internal Medicine

## 2021-10-25 DIAGNOSIS — I87313 Chronic venous hypertension (idiopathic) with ulcer of bilateral lower extremity: Secondary | ICD-10-CM | POA: Diagnosis not present

## 2021-10-25 DIAGNOSIS — I890 Lymphedema, not elsewhere classified: Secondary | ICD-10-CM

## 2021-10-25 DIAGNOSIS — I87312 Chronic venous hypertension (idiopathic) with ulcer of left lower extremity: Secondary | ICD-10-CM

## 2021-10-25 DIAGNOSIS — L97822 Non-pressure chronic ulcer of other part of left lower leg with fat layer exposed: Secondary | ICD-10-CM | POA: Diagnosis not present

## 2021-10-25 DIAGNOSIS — L97812 Non-pressure chronic ulcer of other part of right lower leg with fat layer exposed: Secondary | ICD-10-CM | POA: Diagnosis not present

## 2021-10-25 NOTE — Progress Notes (Signed)
Lauren, [name] ([id number]) Visit Report for 10/25/2021 Chief Complaint Document Details Patient Name: Date of Service: [name], [name] 10/25/2021 11:15 A M Medical Record Number: [id number] Patient Account Number: [id number] Date of Birth/Sex: Treating RN: [birth year] (56 y.o. [name] Primary Care Provider: [name] Other Clinician: Referring Provider: Treating Provider/Extender: Mack Hook Weeks in Treatment: 1 Information Obtained from: Patient Chief Complaint 05/26/2021; Left lower extremity wound Electronic Signature(s) Signed: 10/25/2021 12:41:14 PM By: Kalman Shan DO Entered By: Kalman Shan on 10/25/2021 12:30:50 -------------------------------------------------------------------------------- HPI Details Patient Name: Date of Service: [name] 10/25/2021 11:15 A M Medical Record Number: [id number] Patient Account Number: [id number] Date of Birth/Sex: Treating RN: 02-19-1966 (56 y.o. [name] Primary Care Provider: [name] Other Clinician: Referring Provider: Treating Provider/Extender: Mack Hook Weeks in Treatment: 1 History of Present Illness HPI Description: Admission 05/26/2021 [name] is a 56 year old female with a past medical history of lymphedema That presents to the clinic for a 70-monthhistory of nonhealing ulcer to the posterior left leg. She has not been using any dressing to the wound bed. She has never experienced wounds before. She states that she sleeps in a recliner with her feet down. She has lymphedema pumps however has not been using them due to the wound and the increased swelling to her legs. She currently denies signs of infection. 4/4; patient presents for follow-up. She states that the wrap slid down after a couple days. She states she tolerated it well prior to her coming loose. She denies signs of infection. 4/11 comes in today with a  substantial wound on her left posterior calf. This is superficial in the setting of severe lymphedema. We have been using 3 layer compression. She had a PCR culture done last week which showed extremely high titers of Morganella but also high titers of Enterobacter and E. coli. [facility] The patient is not systemically unwell no fever no chills. Our intake nurse noted very significant odor however 4/18; patient presents for follow-up. [facility] and [facility] today. She was started on ciprofloxacin at last clinic visit but states this caused her nausea and vomiting and she stopped. She denies systemic signs of infection. She tolerated the 4-layer compression wrap well. She has no issues or complaints today. 4/25; patient presents for follow-up. She has been tolerating the 4-layer compression wrap well to the left lower extremity. Unfortunately she has a new wound to the right lower extremity. 5/2; patient presents for follow-up. She has no issues or complaints today. She tolerated the 4-layer compression wraps well. 5/9; patient presents for follow-up. She states she has an appointment to [facility] She has no issues or complaints today. Readmission 10/18/2021 Patient states that 1-2 month ago she noticed some drainage from her posterior left leg. She been keeping the area covered. She has lymphedema pumps but states she does not use these daily. [facility] compression garments made. Due to cost she is unable to obtain these. She currently denies signs of infection. 8/29; patient presents for follow-up. She did well with silver alginate under 4-layer compression. Her custom compression garments are available for her to pick up. However due to financial cost she may not be able to afford these. Electronic  Signature(s) Signed: 10/25/2021 12:41:14 PM By:  Kalman Shan DO Entered By: Kalman Shan on 10/25/2021 12:32:14 -------------------------------------------------------------------------------- Physical Exam Details Patient Name: Date of Service: [name], [name] 10/25/2021 11:15 A M Medical Record Number: [id number] Patient Account Number: [id number] Date of Birth/Sex: Treating RN: 1965-04-16 (56 y.o. [name] Primary Care Provider: [name] Other Clinician: Referring Provider: Treating Provider/Extender: Mack Hook Weeks in Treatment: 1 Constitutional respirations regular, non-labored and within target range for patient.. Cardiovascular 2+ dorsalis pedis/posterior tibialis pulses. Psychiatric pleasant and cooperative. Notes Left lower extremity: Significant lymphedema. No drainage or breakdown of the skin noted. No signs of surrounding infection. Electronic Signature(s) Signed: 10/25/2021 12:41:14 PM By: Kalman Shan DO Entered By: Kalman Shan on 10/25/2021 12:32:43 -------------------------------------------------------------------------------- Physician Orders Details Patient Name: Date of Service: [name], [name] 10/25/2021 11:15 A M Medical Record Number: [id number] Patient Account Number: [id number] Date of Birth/Sex: Treating RN: 23-Apr-1965 (56 y.o. [name] Primary Care Provider: [name] Other Clinician: Referring Provider: Treating Provider/Extender: Mack Hook Weeks in Treatment: 1 Verbal / Phone Orders: No Diagnosis Coding ICD-10 Coding Code Description [phone] Non-pressure chronic ulcer of other part of left lower leg with fat layer exposed I89.0 Lymphedema, not elsewhere classified I87.312 Chronic venous hypertension (idiopathic) with ulcer of left lower extremity Discharge From [facility] Services Discharge from [place] - Call if any future wound care  needs. Continue to pumps 2 times a day for an hour each time. Continue to work on purchasing stockings. Home Health to discontinue wound care standpoint. Electronic Signature(s) Signed: 10/25/2021 12:41:14 PM By: Kalman Shan DO Entered By: Kalman Shan on 10/25/2021 12:32:59 -------------------------------------------------------------------------------- Problem List Details Patient Name: Date of Service: [name], [name] 10/25/2021 11:15 A M Medical Record Number: [id number] Patient Account Number: [id number] Date of Birth/Sex: Treating RN: 04-19-1965 (56 y.o. [name] Primary Care Provider: [name] Other Clinician: Referring Provider: Treating Provider/Extender: Mack Hook Weeks in Treatment: 1 Active Problems ICD-10 Encounter Code Description Active Date MDM Diagnosis [phone] Non-pressure chronic ulcer of other part of left lower leg with fat layer exposed8/22/2023 No Yes I89.0 Lymphedema, not elsewhere classified 10/18/2021 No Yes I87.312 Chronic venous hypertension (idiopathic) with ulcer of left lower extremity 10/18/2021 No Yes Inactive Problems Resolved Problems Electronic Signature(s) Signed: 10/25/2021 12:41:14 PM By: Kalman Shan DO Entered By: Kalman Shan on 10/25/2021 12:30:37 -------------------------------------------------------------------------------- Progress Note Details Patient Name: Date of Service: [name] 10/25/2021 11:15 A M Medical Record Number: [id number] Patient Account Number: [id number] Date of Birth/Sex: Treating RN: 14-Jul-1965 (56 y.o. [name] Primary Care Provider: [name] Other Clinician: Referring Provider: Treating Provider/Extender: Mack Hook Weeks in Treatment: 1 Subjective Chief Complaint Information obtained from Patient 05/26/2021; Left lower extremity wound History of Present Illness (HPI) Admission 05/26/2021 Ms. Shontay Wallner is a 56 year old female with a past medical history of lymphedema That presents to the clinic for a 23-monthhistory of nonhealing ulcer to the posterior left leg. She has not been using any dressing to the wound bed. She has never experienced wounds before. She states that she sleeps in a recliner with her feet down. She has lymphedema pumps however has not been using them due to the wound and the increased swelling to her legs. She currently denies signs of infection. 4/4; patient presents for follow-up. She states that the wrap slid down after a couple days. She states she tolerated it well prior to her coming loose. She denies signs of infection. 4/11 comes in today with a substantial  wound on her left posterior calf. This is superficial in the setting of severe lymphedema. We have been using 3 layer compression. She had a PCR culture done last week which showed extremely high titers of Morganella but also high titers of Enterobacter and E. coli. We have forward the culture to [facility] for preparation of a topical antibiotic spray The patient is not systemically unwell no fever no chills. Our intake nurse noted very significant odor however 4/18; patient presents for follow-up. She was able to obtain her [employer] antibiotics and brought this in today. She was started on ciprofloxacin at last clinic visit but states this caused her nausea and vomiting and she stopped. She denies systemic signs of infection. She tolerated the 4-layer compression wrap well. She has no issues or complaints today. 4/25; patient presents for follow-up. She has been tolerating the 4-layer compression wrap well to the left lower extremity. Unfortunately she has a new wound to the right lower extremity. 5/2; patient presents for follow-up. She has no issues or complaints today. She tolerated the 4-layer compression wraps well. 5/9; patient presents for follow-up. She states she has an appointment to get her  [facility] She has no issues or complaints today. Readmission 10/18/2021 Patient states that 1-2 month ago she noticed some drainage from her posterior left leg. She been keeping the area covered. She has lymphedema pumps but states she does not use these daily. [facility] compression garments made. Due to cost she is unable to obtain these. She currently denies signs of infection. 8/29; patient presents for follow-up. She did well with silver alginate under 4-layer compression. Her custom compression garments are available for her to pick up. However due to financial cost she may not be able to afford these. Patient History Information obtained from Patient, Chart. Family History Cancer - Father, Diabetes - Maternal Grandparents, Heart Disease - Mother,Siblings, Hypertension - Mother,Siblings, Lung Disease - Father, No family history of Hereditary Spherocytosis, Kidney Disease, Seizures, Stroke, Thyroid Problems, Tuberculosis. Social History Never smoker, Marital Status - Single, Alcohol Use - Rarely, Drug Use - No History, Caffeine Use - Daily - Tea. Medical History Eyes Patient has history of Cataracts Hematologic/Lymphatic Patient has history of Anemia, Lymphedema - Since 2019 Respiratory Patient has history of Asthma Cardiovascular Patient has history of Deep Vein Thrombosis, Hypertension Musculoskeletal Patient has history of Osteoarthritis Medical A Surgical History Notes nd Respiratory Hx of PE Integumentary (Skin) Keloid to Right Neck Psychiatric depression Objective Constitutional respirations regular, non-labored and within target range for patient.. Vitals Time Taken: 11:31 AM, Height: 72 in, Weight: 359 lbs, BMI: 48.7, Temperature: 97.9 F, Pulse: 80 bpm, Respiratory Rate: 20 breaths/min, Blood Pressure: 114/76 mmHg. Cardiovascular 2+ dorsalis pedis/posterior tibialis pulses. Psychiatric pleasant  and cooperative. General Notes: Left lower extremity: Significant lymphedema. No drainage or breakdown of the skin noted. No signs of surrounding infection. Integumentary (Hair, Skin) Wound #3 status is Open. Original cause of wound was Gradually Appeared. The date acquired was: 08/17/2021. The wound has been in treatment 1 weeks. The wound is located on the Left,Posterior Lower Leg. The wound measures 0cm length x 0cm width x 0cm depth; 0cm^2 area and 0cm^3 volume. There is Fat Layer (Subcutaneous Tissue) exposed. There is no tunneling or undermining noted. There is a medium amount of serosanguineous drainage noted. The wound margin is distinct with the outline attached to the wound base. There is no granulation within the wound  bed. There is no necrotic tissue within the wound bed. Wound #3 status is Open. Original cause of wound was Gradually Appeared. The date acquired was: 08/17/2021. The wound has been in treatment 1 weeks. The wound is located on the Left,Posterior Lower Leg. The wound measures 0cm length x 0cm width x 0cm depth; 0cm^2 area and 0cm^3 volume. There is no tunneling or undermining noted. There is a medium amount of serosanguineous drainage noted. There is no granulation within the wound bed. There is no necrotic tissue within the wound bed. Assessment Active Problems ICD-10 Non-pressure chronic ulcer of other part of left lower leg with fat layer exposed Lymphedema, not elsewhere classified Chronic venous hypertension (idiopathic) with ulcer of left lower extremity Patient has done well with compression therapy and silver alginate. She has no drainage on exam. I recommended using her custom compression garments however it is understandable if she cannot afford these. For now I recommended Tubigrip and we gave her a pair of this in office. She has lymphedema pumps and recommended she use these 1-2 times daily. She may follow-up as needed. Plan Discharge From [facility]  Services: Discharge from [place] - Call if any future wound care needs. Continue to pumps 2 times a day for an hour each time. Continue to work on purchasing stockings. Home Health to discontinue wound care standpoint. 1. Discharge from clinic due to closed wound 2. Follow-up as needed 3. Compression stockings daily 4. Lymphedema pumps 1 to 2 times daily Electronic Signature(s) Signed: 10/25/2021 12:41:14 PM By: Kalman Shan DO Entered By: Kalman Shan on 10/25/2021 12:35:11 -------------------------------------------------------------------------------- HxROS Details Patient Name: Date of Service: [name] 10/25/2021 11:15 A M Medical Record Number: [id number] Patient Account Number: [id number] Date of Birth/Sex: Treating RN: 05/19/1965 (56 y.o. [name] Primary Care Provider: [name] Other Clinician: Referring Provider: Treating Provider/Extender: Mack Hook Weeks in Treatment: 1 Information Obtained From Patient Chart Eyes Medical History: Positive for: Cataracts Hematologic/Lymphatic Medical History: Positive for: Anemia; Lymphedema - Since 2019 Respiratory Medical History: Positive for: Asthma Past Medical History Notes: Hx of PE Cardiovascular Medical History: Positive for: Deep Vein Thrombosis; Hypertension Integumentary (Skin) Medical History: Past Medical History Notes: Keloid to Right Neck Musculoskeletal Medical History: Positive for: Osteoarthritis Psychiatric Medical History: Past Medical History Notes: depression HBO Extended History Items Eyes: Cataracts Immunizations Pneumococcal Vaccine: Received Pneumococcal Vaccination: Yes Received Pneumococcal Vaccination On or After 60th Birthday: No Implantable Devices None Family and Social History Cancer: Yes - Father; Diabetes: Yes - Maternal Grandparents; Heart Disease: Yes - Mother,Siblings; Hereditary Spherocytosis: No;  Hypertension: Yes - Mother,Siblings; Kidney Disease: No; Lung Disease: Yes - Father; Seizures: No; Stroke: No; Thyroid Problems: No; Tuberculosis: No; Never smoker; Marital Status - Single; Alcohol Use: Rarely; Drug Use: No History; Caffeine Use: Daily - T Financial Concerns: No; Food, Clothing or Shelter Needs: No; Support ea; System Lacking: No; Transportation Concerns: No Electronic Signature(s) Signed: 10/25/2021 12:41:14 PM By: Kalman Shan DO Signed: 10/25/2021 5:09:16 PM By: Deon Pilling RN, BSN Entered By: Kalman Shan on 10/25/2021 12:32:20 -------------------------------------------------------------------------------- SuperBill Details Patient Name: Date of Service: [name], [name] 10/25/2021 Medical Record Number: [id number] Patient Account Number: [id number] Date of Birth/Sex: Treating RN: 1965-05-13 (56 y.o. [name] Primary Care Provider: [name] Other Clinician: Referring Provider: Treating Provider/Extender: Mack Hook Weeks in Treatment: 1 Diagnosis Coding ICD-10 Codes Code Description [phone] Non-pressure chronic ulcer of other part of left lower leg with fat layer exposed I89.0 Lymphedema, not elsewhere  classified I87.312 Chronic venous hypertension (idiopathic) with ulcer of left lower extremity Facility Procedures CPT4 Code: [id number] Description: 68864 - WOUND CARE VISIT-LEV 3 EST PT Modifier: Quantity: 1 Physician Procedures : CPT4 Code Description Modifier [id number] - WC PHYS LEVEL 3 - EST PT ICD-10 Diagnosis Description L97.822 Non-pressure chronic ulcer of other part of left lower leg with fat layer exposed I89.0 Lymphedema, not elsewhere classified I87.312 Chronic  venous hypertension (idiopathic) with ulcer of left lower extremity Quantity: 1 Electronic Signature(s) Signed: 10/25/2021 12:41:14 PM By: Kalman Shan DO Entered By: Kalman Shan on 10/25/2021 12:35:34

## 2021-10-28 NOTE — Progress Notes (Signed)
SIRENITY, SHEW ([id number]) Visit Report for 10/25/2021 Arrival Information Details Patient Name: Date of Service: [name], [name] 10/25/2021 11:15 A M Medical Record Number: [id number] Patient Account Number: [id number] Date of Birth/Sex: Treating RN: 10-17-1965 (56 y.o. [name], Lauren Primary Care Ritamarie Arkin: [name] Other Clinician: Referring Damar Petit: Treating Myeisha Kruser/Extender: Mack Hook Weeks in Treatment: 1 Visit Information History Since Last Visit Added or deleted any medications: No Patient Arrived: [name] Any new allergies or adverse reactions: No Arrival Time: 11:28 Had a fall or experienced change in No Accompanied By: self activities of daily living that may affect Transfer Assistance: None risk of falls: Patient Identification Verified: Yes Signs or symptoms of abuse/neglect since last visito No Secondary Verification Process Completed: Yes Hospitalized since last visit: No Patient Requires Transmission-Based Precautions: No Implantable device outside of the clinic excluding No Patient Has Alerts: Yes cellular tissue based products placed in the center Patient Alerts: Patient on Blood Thinner since last visit: Has Compression in Place as Prescribed: Yes Pain Present Now: No Electronic Signature(s) Signed: 10/28/2021 1:10:23 PM By: Rhae Hammock RN Entered By: Rhae Hammock on 10/25/2021 11:28:50 -------------------------------------------------------------------------------- Clinic Level of Care Assessment Details Patient Name: Date of Service: [name], [name] 10/25/2021 11:15 A M Medical Record Number: [id number] Patient Account Number: [id number] Date of Birth/Sex: Treating RN: [birth year] (56 y.o. [name], [name] Primary Care Sterlin Knightly: [name] Other Clinician: Referring Sheritta Deeg: Treating Manases Etchison/Extender: Mack Hook Weeks in Treatment: 1 Clinic Level of Care Assessment  Items TOOL 4 Quantity Score X- 1 0 Use when only an EandM is performed on FOLLOW-UP visit ASSESSMENTS - Nursing Assessment / Reassessment X- 1 10 Reassessment of Co-morbidities (includes updates in patient status) X- 1 5 Reassessment of Adherence to Treatment Plan ASSESSMENTS - Wound and Skin A ssessment / Reassessment X - Simple Wound Assessment / Reassessment - one wound 1 5 '[]'$  - 0 Complex Wound Assessment / Reassessment - multiple wounds X- 1 10 Dermatologic / Skin Assessment (not related to wound area) ASSESSMENTS - Focused Assessment X- 1 5 Circumferential Edema Measurements - multi extremities '[]'$  - 0 Nutritional Assessment / Counseling / Intervention '[]'$  - 0 Lower Extremity Assessment (monofilament, tuning fork, pulses) '[]'$  - 0 Peripheral Arterial Disease Assessment (using hand held doppler) ASSESSMENTS - Ostomy and/or Continence Assessment and Care '[]'$  - 0 Incontinence Assessment and Management '[]'$  - 0 Ostomy Care Assessment and Management (repouching, etc.) PROCESS - Coordination of Care X - Simple Patient / Family Education for ongoing care 1 15 '[]'$  - 0 Complex (extensive) Patient / Family Education for ongoing care X- 1 10 Staff obtains Programmer, systems, Records, T Results / Process Orders est X- 1 10 Staff telephones HHA, Nursing Homes / Clarify orders / etc '[]'$  - 0 Routine Transfer to another Facility (non-emergent condition) '[]'$  - 0 Routine Hospital Admission (non-emergent condition) '[]'$  - 0 New Admissions / Biomedical engineer / Ordering NPWT Apligraf, etc. , '[]'$  - 0 Emergency Hospital Admission (emergent condition) X- 1 10 Simple Discharge Coordination '[]'$  - 0 Complex (extensive) Discharge Coordination PROCESS - Special Needs '[]'$  - 0 Pediatric / Minor Patient Management '[]'$  - 0 Isolation Patient Management '[]'$  - 0 Hearing / Language / Visual special needs '[]'$  - 0 Assessment of Community assistance (transportation, D/C planning, etc.) '[]'$  - 0 Additional  assistance / Altered mentation '[]'$  - 0 Support Surface(s) Assessment (bed, cushion, seat, etc.) INTERVENTIONS - Wound Cleansing / Measurement X - Simple Wound Cleansing - one wound 1 5 '[]'$  - 0 Complex  Wound Cleansing - multiple wounds X- 1 5 Wound Imaging (photographs - any number of wounds) '[]'$  - 0 Wound Tracing (instead of photographs) X- 1 5 Simple Wound Measurement - one wound '[]'$  - 0 Complex Wound Measurement - multiple wounds INTERVENTIONS - Wound Dressings '[]'$  - 0 Small Wound Dressing one or multiple wounds '[]'$  - 0 Medium Wound Dressing one or multiple wounds '[]'$  - 0 Large Wound Dressing one or multiple wounds '[]'$  - 0 Application of Medications - topical '[]'$  - 0 Application of Medications - injection INTERVENTIONS - Miscellaneous '[]'$  - 0 External ear exam '[]'$  - 0 Specimen Collection (cultures, biopsies, blood, body fluids, etc.) '[]'$  - 0 Specimen(s) / Culture(s) sent or taken to Lab for analysis '[]'$  - 0 Patient Transfer (multiple staff / Civil Service fast streamer / Similar devices) '[]'$  - 0 Simple Staple / Suture removal (25 or less) '[]'$  - 0 Complex Staple / Suture removal (26 or more) '[]'$  - 0 Hypo / Hyperglycemic Management (close monitor of Blood Glucose) '[]'$  - 0 Ankle / Brachial Index (ABI) - do not check if billed separately X- 1 5 Vital Signs Has the patient been seen at the hospital within the last three years: Yes Total Score: 100 Level Of Care: New/Established - Level 3 Electronic Signature(s) Signed: 10/25/2021 5:09:16 PM By: Deon Pilling RN, BSN Entered By: Deon Pilling on 10/25/2021 11:55:09 -------------------------------------------------------------------------------- Encounter Discharge Information Details Patient Name: Date of Service: [name] 10/25/2021 11:15 A M Medical Record Number: [id number] Patient Account Number: [id number] Date of Birth/Sex: Treating RN: October 25, 1965 (56 y.o. [name] Primary Care Rucker Pridgeon: [name] Other  Clinician: Referring Annalyse Langlais: Treating Shamarcus Hoheisel/Extender: Lamount Cranker in Treatment: 1 Encounter Discharge Information Items Discharge Condition: Stable Ambulatory Status: Walker Discharge Destination: Home Transportation: Private Auto Accompanied By: self Schedule Follow-up Appointment: No Clinical Summary of Care: Electronic Signature(s) Signed: 10/25/2021 5:09:16 PM By: Deon Pilling RN, BSN Entered By: Deon Pilling on 10/25/2021 11:55:41 -------------------------------------------------------------------------------- Lower Extremity Assessment Details Patient Name: Date of Service: [name], [name] 10/25/2021 11:15 A M Medical Record Number: [id number] Patient Account Number: [id number] Date of Birth/Sex: Treating RN: 10-Nov-1965 (56 y.o. [name], Lauren Primary Care Darene Nappi: [name] Other Clinician: Referring Chellie Vanlue: Treating Ileah Falkenstein/Extender: Mack Hook Weeks in Treatment: 1 Edema Assessment Assessed: [Left: No] [Right: No] Edema: [Left: Ye] [Right: s] Calf Left: Right: Point of Measurement: 31 cm From Medial Instep 67 cm Ankle Left: Right: Point of Measurement: 12 cm From Medial Instep 53 cm Electronic Signature(s) Signed: 10/28/2021 1:10:23 PM By: Rhae Hammock RN Entered By: Rhae Hammock on 10/25/2021 11:48:10 -------------------------------------------------------------------------------- Multi Wound Chart Details Patient Name: Date of Service: [name] 10/25/2021 11:15 A M Medical Record Number: [id number] Patient Account Number: [id number] Date of Birth/Sex: Treating RN: 1965/03/06 (56 y.o. [name], [name] Primary Care Erminio Nygard: [name] Other Clinician: Referring Mikaiya Tramble: Treating Charon Akamine/Extender: Mack Hook Weeks in Treatment: 1 Vital Signs Height(in): 72 Pulse(bpm): 80 Weight(lbs): 359 Blood Pressure(mmHg): 114/76 Body Mass  Index(BMI): 48.7 Temperature(F): 97.9 Respiratory Rate(breaths/min): 20 Photos: [3:No Photos] [N/A:N/A] Left, Posterior Lower Leg Left, Posterior Lower Leg N/A Wound Location: Gradually Appeared Gradually Appeared N/A Wounding Event: Lymphedema Lymphedema N/A Primary Etiology: Cataracts, Anemia, Lymphedema, Cataracts, Anemia, Lymphedema, N/A Comorbid History: Asthma, Deep Vein Thrombosis, Asthma, Deep Vein Thrombosis, Hypertension, Osteoarthritis Hypertension, Osteoarthritis 08/17/2021 08/17/2021 N/A Date Acquired: 1 1 N/A Weeks of Treatment: Open Open N/A Wound Status: No No N/A Wound Recurrence: 0x0x0 0x0x0 N/A Measurements L x W x D (cm)  0 0 N/A A (cm) : rea 0 0 N/A Volume (cm) : 100.00% 100.00% N/A % Reduction in Area: 100.00% 100.00% N/A % Reduction in Volume: Full Thickness Without Exposed Full Thickness Without Exposed N/A Classification: Support Structures Support Structures Medium Medium N/A Exudate Amount: Serosanguineous Serosanguineous N/A Exudate Type: red, brown red, brown N/A Exudate Color: Distinct, outline attached N/A N/A Wound Margin: None Present (0%) None Present (0%) N/A Granulation Amount: None Present (0%) None Present (0%) N/A Necrotic Amount: Fat Layer (Subcutaneous Tissue): Yes Fascia: No N/A Exposed Structures: Fascia: No Fat Layer (Subcutaneous Tissue): No Tendon: No Tendon: No Muscle: No Muscle: No Joint: No Joint: No Bone: No Bone: No Large (67-100%) Large (67-100%) N/A Epithelialization: Treatment Notes Electronic Signature(s) Signed: 10/25/2021 12:41:14 PM By: Kalman Shan DO Signed: 10/25/2021 5:09:16 PM By: Deon Pilling RN, BSN Entered By: Kalman Shan on 10/25/2021 12:30:42 -------------------------------------------------------------------------------- Multi-Disciplinary Care Plan Details Patient Name: Date of Service: [name] 10/25/2021 11:15 A M Medical Record Number: [id number] Patient  Account Number: [id number] Date of Birth/Sex: Treating RN: May 11, 1965 (56 y.o. [name] Primary Care Lindee Leason: [name] Other Clinician: Referring Ernestina Joe: Treating Zainah Steven/Extender: Mack Hook Weeks in Treatment: 1 Active Inactive Electronic Signature(s) Signed: 10/28/2021 1:42:30 PM By: Deon Pilling RN, BSN Previous Signature: 10/25/2021 5:09:16 PM Version By: Deon Pilling RN, BSN Entered By: Deon Pilling on 10/28/2021 12:45:47 -------------------------------------------------------------------------------- Pain Assessment Details Patient Name: Date of Service: [name], [name] 10/25/2021 11:15 A M Medical Record Number: [id number] Patient Account Number: [id number] Date of Birth/Sex: Treating RN: May 14, 1965 (56 y.o. [name], Lauren Primary Care Kristan Votta: [name] Other Clinician: Referring Tenoch Mcclure: Treating Olanda Boughner/Extender: Mack Hook Weeks in Treatment: 1 Active Problems Location of Pain Severity and Description of Pain Patient Has Paino No Site Locations Pain Management and Medication Current Pain Management: Electronic Signature(s) Signed: 10/28/2021 1:10:23 PM By: Rhae Hammock RN Entered By: Rhae Hammock on 10/25/2021 11:31:33 -------------------------------------------------------------------------------- Patient/Caregiver Education Details Patient Name: Date of Service: [name] 8/29/2023andnbsp11:15 [place] Record Number: [id number] Patient Account Number: [id number] Date of Birth/Gender: Treating RN: [birth year] (56 y.o. [name] Primary Care Physician: [name] Other Clinician: Referring Physician: Treating Physician/Extender: Lamount Cranker in Treatment: 1 Education Assessment Education Provided To: Patient Education Topics Provided Wound/Skin Impairment: Handouts: Skin Care Do's and Dont's Methods:  Explain/Verbal Responses: Reinforcements needed Electronic Signature(s) Signed: 10/25/2021 5:09:16 PM By: Deon Pilling RN, BSN Entered By: Deon Pilling on 10/25/2021 11:42:13 -------------------------------------------------------------------------------- Wound Assessment Details Patient Name: Date of Service: [name], [name] 10/25/2021 11:15 A M Medical Record Number: [id number] Patient Account Number: [id number] Date of Birth/Sex: Treating RN: [birth year] (56 y.o. [name], Lauren Primary Care Nai Borromeo: [name] Other Clinician: Referring Dreon Pineda: Treating Forney Kleinpeter/Extender: Mack Hook Weeks in Treatment: 1 Wound Status Wound Number: 3 Primary Lymphedema Etiology: Wound Location: Left, Posterior Lower Leg Wound Open Wounding Event: Gradually Appeared Status: Date Acquired: 08/17/2021 Comorbid Cataracts, Anemia, Lymphedema, Asthma, Deep Vein Weeks Of Treatment: 1 History: Thrombosis, Hypertension, Osteoarthritis Clustered Wound: No Wound Measurements Length: (cm) Width: (cm) Depth: (cm) Area: (cm) Volume: (cm) 0 % Reduction in Area: 100% 0 % Reduction in Volume: 100% 0 Epithelialization: Large (67-100%) 0 Tunneling: No 0 Undermining: No Wound Description Classification: Full Thickness Without Exposed Support Structures Wound Margin: Distinct, outline attached Exudate Amount: Medium Exudate Type: Serosanguineous Exudate Color: red, brown Foul Odor After Cleansing: No Slough/Fibrino Yes Wound Bed Granulation Amount: None Present (0%) Exposed Structure Necrotic Amount: None Present (0%)  Fascia Exposed: No Fat Layer (Subcutaneous Tissue) Exposed: Yes Tendon Exposed: No Muscle Exposed: No Joint Exposed: No Bone Exposed: No Electronic Signature(s) Signed: 10/28/2021 1:10:23 PM By: Rhae Hammock RN Entered By: Rhae Hammock on 10/25/2021  11:48:46 -------------------------------------------------------------------------------- Wound Assessment Details Patient Name: Date of Service: [name], [name] 10/25/2021 11:15 A M Medical Record Number: [id number] Patient Account Number: [id number] Date of Birth/Sex: Treating RN: 1965-05-30 (56 y.o. [name], Lauren Primary Care Darik Massing: [name] Other Clinician: Referring Artasia Thang: Treating Deacon Gadbois/Extender: Mack Hook Weeks in Treatment: 1 Wound Status Wound Number: 3 Primary Lymphedema Etiology: Wound Location: Left, Posterior Lower Leg Wound Open Wounding Event: Gradually Appeared Status: Date Acquired: 08/17/2021 Comorbid Cataracts, Anemia, Lymphedema, Asthma, Deep Vein Weeks Of Treatment: 1 History: Thrombosis, Hypertension, Osteoarthritis Clustered Wound: No Photos Wound Measurements Length: (cm) Width: (cm) Depth: (cm) Area: (cm) Volume: (cm) 0 % Reduction in Area: 100% 0 % Reduction in Volume: 100% 0 Epithelialization: Large (67-100%) 0 Tunneling: No 0 Undermining: No Wound Description Classification: Full Thickness Without Exposed Support Stru Exudate Amount: Medium Exudate Type: Serosanguineous Exudate Color: red, brown ctures Wound Bed Granulation Amount: None Present (0%) Exposed Structure Necrotic Amount: None Present (0%) Fascia Exposed: No Fat Layer (Subcutaneous Tissue) Exposed: No Tendon Exposed: No Muscle Exposed: No Joint Exposed: No Bone Exposed: No Electronic Signature(s) Signed: 10/28/2021 1:10:23 PM By: Rhae Hammock RN Entered By: Rhae Hammock on 10/25/2021 11:49:24 -------------------------------------------------------------------------------- [place] Details Patient Name: Date of Service: [name] 10/25/2021 11:15 A M Medical Record Number: [id number] Patient Account Number: [id number] Date of Birth/Sex: Treating RN: [birth year] (56 y.o. [name], Lauren Primary Care  Ileanna Gemmill: [name] Other Clinician: Referring Nicanor Mendolia: Treating Jonia Oakey/Extender: Mack Hook Weeks in Treatment: 1 Vital Signs Time Taken: 11:31 Temperature (F): 97.9 Height (in): 72 Pulse (bpm): 80 Weight (lbs): 359 Respiratory Rate (breaths/min): 20 Body Mass Index (BMI): 48.7 Blood Pressure (mmHg): 114/76 Reference Range: 80 - 120 mg / dl Electronic Signature(s) Signed: 10/28/2021 1:10:23 PM By: Rhae Hammock RN Entered By: Rhae Hammock on 10/25/2021 11:31:25

## 2021-11-08 NOTE — Progress Notes (Unsigned)
Cardiology Office Note:    Date:  11/09/2021   ID:  [name], DOB 1965/12/19, MRN [id number]  PCP:  Gildardo Pounds, NP   [facility] Providers Cardiologist:  Lenna Sciara, MD Referring MD: Gildardo Pounds, NP   Chief Complaint/Reason for Referral: Chest pain  ASSESSMENT:    Chest pain, unspecified type - Plan: Basic metabolic panel  BMI 56.9-79.4, adult ([place]) - Plan: Basic metabolic panel  History of pulmonary embolus (PE) - Plan: Basic metabolic panel  Lymphedema associated with obesity - Plan: Basic metabolic panel  Diastolic dysfunction - Plan: Basic metabolic panel  PLAN:    In order of problems listed above:  Chest pain: Coronary CTA was reassuring.  We will stop the patient's Imdur, Toprol, and as needed nitroglycerin.  If she develops recurrent chest pain I have asked her to talk to her PCP about a GI evaluation.  Follow-up in 9 months or earlier if needed.   Elevated BMI: We will refer to pharmacy for pharmacotherapy recommendations. History of PE and DVT: Continue indefinite anticoagulation Lymphedema: Followed wound clinic. Diastolic dysfunction: Given LVH we will start losartan 25 mg and check a BMP next week.    Dispo:  Return in about 9 months (around 08/10/2022).     Medication Adjustments/Labs and Tests Ordered: Current medicines are reviewed at length with the patient today.  Concerns regarding medicines are outlined above.   Tests Ordered: Orders Placed This Encounter  Procedures   Basic metabolic panel    Medication Changes: Meds ordered this encounter  Medications   losartan (COZAAR) 25 MG tablet    Sig: Take 1 tablet (25 mg total) by mouth daily.    Dispense:  90 tablet    Refill:  3    History of Present Illness:    FOCUSED PROBLEM LIST:   1.  Pulmonary embolism in 2018 with subsequent lower extremity DVT on indefinite Eliquis 2.  Anxiety 3.  Asthma 4.  Depression 5.  BMI of 45-49 6.  Chest pain with calcium score  of 0 7.  Lymphedema followed by wound clinic  March 2023: Patient seen for initial consultation for chest pain.  She was referred for coronary CTA and wound clinic evaluation due to lymphedema.  Today: The patient is doing very well.  She is lost 30 pounds by diet and exercise.  She feels relatively well.  She has had no recurrent chest pain or dyspnea.  She was hospitalized recently due to issues stemming from her lymphedema.  She is otherwise well without significant complaints today.         Current Medications: Current Meds  Medication Sig   albuterol (VENTOLIN HFA) 108 (90 Base) MCG/ACT inhaler Inhale 2 puffs into the lungs every 6 (six) hours as needed for wheezing or shortness of breath.   apixaban (ELIQUIS) 5 MG TABS tablet Take 1 tablet (5 mg total) by mouth 2 (two) times daily.   CIMZIA 2 X 200 MG/ML PSKT Inject into the skin every 14 (fourteen) days.   folic acid (FOLVITE) 1 MG tablet Take 1 tablet by mouth once daily   furosemide (LASIX) 20 MG tablet Take 1 tablet (20 mg total) by mouth daily. X 7 days then prn swelling   losartan (COZAAR) 25 MG tablet Take 1 tablet (25 mg total) by mouth daily.   methotrexate 50 MG/2ML injection Inject 20 mg into the skin once a week.   Misc. Devices (BARIATRIC ROLLATOR) MISC Please provide patient with insurance approved bariatric  rollator walker. I89.0, R53.81, R26.81   [DISCONTINUED] isosorbide mononitrate (IMDUR) 30 MG 24 hr tablet Take 1 tablet (30 mg total) by mouth at bedtime.   [DISCONTINUED] metoprolol succinate (TOPROL XL) 25 MG 24 hr tablet Take 1 tablet (25 mg total) by mouth at bedtime.   [DISCONTINUED] nitroGLYCERIN (NITROSTAT) 0.4 MG SL tablet Place 1 tablet (0.4 mg total) under the tongue every 5 (five) minutes as needed for chest pain.     Allergies:    Abatacept, Bee venom, Augmentin [amoxicillin-pot clavulanate], Latex, and Nulytely [peg 3350-kcl-na bicarb-nacl]   Social History:   Social History   Tobacco Use    Smoking status: Never   Smokeless tobacco: Never  Vaping Use   Vaping Use: Never used  Substance Use Topics   Alcohol use: No   Drug use: No     Family Hx: Family History  Problem Relation Age of Onset   Heart disease Mother    Hypertension Mother    Hypertension Father    Colon cancer Father 62       Passed away 77 yrs old   Hypertension Sister    Cancer Maternal Grandmother        gastric cancer   Cancer Maternal Grandfather        pancreatic cancer   Colon cancer Paternal Grandmother    Colon cancer Paternal Uncle    Colon polyps Neg Hx      Review of Systems:   Please see the history of present illness.    All other systems reviewed and are negative.     EKGs/Labs/Other Test Reviewed:    EKG: Sinus rhythm with anterior Q waves and possible LVH; EKG today demonstrates sinus rhythm with possible LVH and no acute ST changes.  Prior CV studies:  March 2023: 1. Coronary calcium score of 0. This was 0 percentile for age-, sex, and race-matched controls. 2. Normal coronary origin with right dominance. 3. No evidence of CAD. 4. Small pericardial effusion. RECOMMENDATIONS: 1. CAD-RADS 0: No evidence of CAD (0%). Consider non-atherosclerotic causes of chest pain.   TTE 2/23  1. Left ventricular ejection fraction, by estimation, is 65 to 70%. The  left ventricle has hyperdynamic function. The left ventricle has no  regional wall motion abnormalities. There is mild concentric left  ventricular hypertrophy. Left ventricular  diastolic parameters are consistent with Grade I diastolic dysfunction  (impaired relaxation). The average left ventricular global longitudinal  strain is -22.4 %.   2. Right ventricular systolic function is normal. The right ventricular  size is mildly enlarged. There is mildly elevated pulmonary artery  systolic pressure. The estimated right ventricular systolic pressure is  25.3 mmHg.   3. Left atrial size was mildly dilated.   4. A small  pericardial effusion is present. The pericardial effusion is  localized near the right ventricle. There is no evidence of cardiac  tamponade.   5. The mitral valve is normal in structure. Mild mitral valve  regurgitation. No evidence of mitral stenosis.   6. The aortic valve is normal in structure. Aortic valve regurgitation is  not visualized. No aortic stenosis is present.   7. The inferior vena cava is dilated in size with <50% respiratory  variability, suggesting right atrial pressure of 15 mmHg.   Imaging studies that I have independently reviewed today: Echocardiogram  Recent Labs: 10/04/2021: ALT 13; BUN 10; Creatinine, Ser 0.90; Hemoglobin 11.1; Platelets 304; Potassium 3.9; Sodium 141   Recent Lipid Panel Lab Results  Component Value  Date/Time   CHOL 185 07/12/2020 04:28 PM   TRIG 100 07/12/2020 04:28 PM   HDL 50 07/12/2020 04:28 PM   LDLCALC 117 (H) 07/12/2020 04:28 PM    Risk Assessment/Calculations:          Physical Exam:    VS:  BP 108/60   Pulse (!) 101   Ht 6' (1.829 m)   Wt (!) 362 lb 3.2 oz (164.3 kg)   LMP 01/31/2011   SpO2 96%   BMI 49.12 kg/m    Wt Readings from Last 3 Encounters:  11/09/21 (!) 362 lb 3.2 oz (164.3 kg)  10/04/21 (!) 357 lb (161.9 kg)  09/07/21 (!) 350 lb (158.8 kg)    GENERAL:  No apparent distress, AOx3 HEENT:  No carotid bruits, +2 carotid impulses, no scleral icterus CAR: RRR  no murmurs, gallops, rubs, or thrills RES:  Clear to auscultation bilaterally ABD:  Soft, nontender, nondistended, positive bowel sounds x 4 VASC:  +2 radial pulses, +2 carotid pulses, palpable pedal pulses NEURO:  CN 2-12 grossly intact; motor and sensory grossly intact PSYCH:  No active depression or anxiety EXT:  Lymphedema without ecchymosis or cyanosis  Signed, Early Osmond, MD  11/09/2021 2:[address], [place], [place]  [address] Phone: [phone]; Fax: [phone]   Note:  This  document was prepared using Dragon voice recognition software and may include unintentional dictation errors.

## 2021-11-09 ENCOUNTER — Encounter: Payer: Self-pay | Admitting: Internal Medicine

## 2021-11-09 ENCOUNTER — Ambulatory Visit: Payer: Medicare HMO | Attending: Internal Medicine | Admitting: Internal Medicine

## 2021-11-09 VITALS — BP 108/60 | HR 101 | Ht 72.0 in | Wt 362.2 lb

## 2021-11-09 DIAGNOSIS — Z86711 Personal history of pulmonary embolism: Secondary | ICD-10-CM | POA: Diagnosis not present

## 2021-11-09 DIAGNOSIS — E669 Obesity, unspecified: Secondary | ICD-10-CM

## 2021-11-09 DIAGNOSIS — I5189 Other ill-defined heart diseases: Secondary | ICD-10-CM | POA: Diagnosis not present

## 2021-11-09 DIAGNOSIS — I890 Lymphedema, not elsewhere classified: Secondary | ICD-10-CM | POA: Diagnosis not present

## 2021-11-09 DIAGNOSIS — R079 Chest pain, unspecified: Secondary | ICD-10-CM

## 2021-11-09 DIAGNOSIS — Z6841 Body Mass Index (BMI) 40.0 and over, adult: Secondary | ICD-10-CM

## 2021-11-09 MED ORDER — LOSARTAN POTASSIUM 25 MG PO TABS: 25.0000 mg | ORAL_TABLET | Freq: Every day | ORAL | 3 refills | Status: DC

## 2021-11-09 NOTE — Patient Instructions (Signed)
Medication Instructions:  Your physician has recommended you make the following change in your medication:  1) STOP taking Imdur (isosorbide) 2) STOP taking Toprol (metoprolol succinate) 3) STOP taking nitroglycerin 4) START taking losartan 25 mg daily  *If you need a refill on your cardiac medications before your next appointment, please call your pharmacy*  Lab Work: IN ONE WEEK: BMET If you have labs (blood work) drawn today and your tests are completely normal, you will receive your results only by: [place] (if you have MyChart) OR A paper copy in the mail If you have any lab test that is abnormal or we need to change your treatment, we will call you to review the results.  Follow-Up: At [facility], you and your health needs are our priority.  As part of our continuing mission to provide you with exceptional heart care, we have created designated Provider Care Teams.  These Care Teams include your primary Cardiologist (physician) and Advanced Practice Providers (APPs -  Physician Assistants and Nurse Practitioners) who all work together to provide you with the care you need, when you need it.  Your next appointment:   9 month(s)  The format for your next appointment:   In Person  Provider:   Early Osmond, MD     Important Information About Sugar

## 2021-11-16 ENCOUNTER — Ambulatory Visit: Payer: Medicare HMO | Attending: Internal Medicine

## 2021-11-16 DIAGNOSIS — Z86711 Personal history of pulmonary embolism: Secondary | ICD-10-CM

## 2021-11-16 DIAGNOSIS — Z6841 Body Mass Index (BMI) 40.0 and over, adult: Secondary | ICD-10-CM | POA: Diagnosis not present

## 2021-11-16 DIAGNOSIS — R079 Chest pain, unspecified: Secondary | ICD-10-CM

## 2021-11-16 DIAGNOSIS — E669 Obesity, unspecified: Secondary | ICD-10-CM | POA: Diagnosis not present

## 2021-11-16 DIAGNOSIS — I5189 Other ill-defined heart diseases: Secondary | ICD-10-CM

## 2021-11-16 DIAGNOSIS — I890 Lymphedema, not elsewhere classified: Secondary | ICD-10-CM | POA: Diagnosis not present

## 2021-11-17 LAB — BASIC METABOLIC PANEL
BUN/Creatinine Ratio: 11 (ref 9–23)
BUN: 10 mg/dL (ref 6–24)
CO2: 20 mmol/L (ref 20–29)
Calcium: 9.6 mg/dL (ref 8.7–10.2)
Chloride: 105 mmol/L (ref 96–106)
Creatinine, Ser: 0.94 mg/dL (ref 0.57–1.00)
Glucose: 116 mg/dL — ABNORMAL HIGH (ref 70–99)
Potassium: 3.9 mmol/L (ref 3.5–5.2)
Sodium: 140 mmol/L (ref 134–144)
eGFR: 73 mL/min/{1.73_m2} (ref >59)

## 2021-11-18 ENCOUNTER — Telehealth: Payer: Self-pay

## 2021-11-18 NOTE — Patient Outreach (Signed)
  Care Coordination   Initial Visit Note   11/18/2021 Name: [name] MRN: [id number] DOB: 1965/04/28  [name] is a 56 y.o. year old female who sees Lauren Pounds, NP for primary care. I spoke with  [name] by phone today.  What matters to the patients health and wellness today?  Patient denies any acute issues or concerns at present. States thing have been well. No recent hospital admits.Denies any RN CM needs or concerns at this time.     Goals Addressed             This Visit's Progress    Care Coordination-no follow up required          SDOH assessments and interventions completed:  Yes  SDOH Interventions Today    Flowsheet Row Most Recent Value  SDOH Interventions   Food Insecurity Interventions Intervention Not Indicated  Transportation Interventions Intervention Not Indicated        Care Coordination Interventions Activated:  Yes  Care Coordination Interventions:  Yes, provided   Follow up plan: No further intervention required.   Encounter Outcome:  Pt. Visit Completed   Enzo Montgomery, RN,BSN,CCM [facility] Telephonic Care Management Coordinator Direct Phone: [phone] Toll Free: [phone] Fax: [phone]

## 2021-11-30 ENCOUNTER — Encounter: Payer: Self-pay | Admitting: Nurse Practitioner

## 2021-11-30 ENCOUNTER — Ambulatory Visit: Payer: Medicare HMO | Attending: Nurse Practitioner | Admitting: Nurse Practitioner

## 2021-11-30 VITALS — BP 108/69 | HR 93 | Temp 98.1°F | Ht 72.0 in | Wt 358.4 lb

## 2021-11-30 DIAGNOSIS — R7303 Prediabetes: Secondary | ICD-10-CM

## 2021-11-30 DIAGNOSIS — Z1211 Encounter for screening for malignant neoplasm of colon: Secondary | ICD-10-CM

## 2021-11-30 DIAGNOSIS — R1013 Epigastric pain: Secondary | ICD-10-CM | POA: Diagnosis not present

## 2021-11-30 DIAGNOSIS — I10 Essential (primary) hypertension: Secondary | ICD-10-CM | POA: Diagnosis not present

## 2021-11-30 MED ORDER — FAMOTIDINE 40 MG PO TABS: 40.0000 mg | ORAL_TABLET | Freq: Every day | ORAL | 1 refills | Status: DC

## 2021-11-30 NOTE — Progress Notes (Signed)
Assessment & Plan:  [name] was seen today for hypertension.  Diagnoses and all orders for this visit:  Primary hypertension Continue losartan 25 mg and Lasix 20 mg daily as prescribed.  Reminded to bring in blood pressure log for follow  up appointment.  RECOMMENDATIONS: DASH/Mediterranean Diets are healthier choices for HTN.    Colon cancer screening -     Ambulatory referral to Gastroenterology  Epigastric pain -     Ambulatory referral to Gastroenterology Started on famotidine   Prediabetes -     Hemoglobin A1c    Patient has been counseled on age-appropriate routine health concerns for screening and prevention. These are reviewed and up-to-date. Referrals have been placed accordingly. Immunizations are up-to-date or declined.    Subjective:   Chief Complaint  Patient presents with   Hypertension   HPI [name] 56 y.o. female presents to office today for follow-up to hypertension.   Patient has been counseled on age-appropriate routine health concerns for screening and prevention. These are reviewed and up-to-date. Referrals have been placed accordingly. Immunizations are up-to-date or declined.     Mammogram: Referral placed today Colonoscopy: Referral placed today  She has a past medical history of Anemia, Anxiety, OA,  Asthma, Urinary incontinence with foley in place,  Depression (08/2010), Uterine fibroids, Keloid, Lymphedema, and Pulmonary embolism.  HTN Blood pressure is well controlled with losartan 25 mg daily and use of furosemide 20 mg daily for fluid retention.  Weight is down 10 pounds intentionally and she has been working out with a Clinical research associate. BP Readings from Last 3 Encounters:  11/30/21 108/69  11/09/21 108/60  10/05/21 (!) 175/100     Knee pain  She has chronic left knee pain and would like to be referred to an orthopedist to see if she is a candidate for knee surgery.  Left knee x-ray 02/26/2017 by Dr. Erlinda Hong Severe degenerative joint  disease with destruction of joint space and  bone-on-bone articulation    Requesting referral to GI for colonoscopy and chronic epigastric pain.   Review of Systems  Constitutional:  Negative for fever, malaise/fatigue and weight loss.  HENT: Negative.  Negative for nosebleeds.   Eyes: Negative.  Negative for blurred vision, double vision and photophobia.  Respiratory: Negative.  Negative for cough and shortness of breath.   Cardiovascular:  Positive for leg swelling. Negative for chest pain and palpitations.  Gastrointestinal:  Positive for heartburn. Negative for nausea and vomiting.  Musculoskeletal:  Positive for joint pain. Negative for myalgias.  Neurological: Negative.  Negative for dizziness, focal weakness, seizures and headaches.  Psychiatric/Behavioral: Negative.  Negative for suicidal ideas.     Past Medical History:  Diagnosis Date   Anemia    Anxiety    Arthritis    Asthma    hx as child - no prob as adult - no inhaler   Blood transfusion 01/22/11   transfusion 2 units [facility]   Depression 08/2010   psych assessment   Dyspnea    Fibroid    Headache(784.0)    rx for imitrex - last one jan   Keloid    Lymphedema    Pulmonary embolism [facility]     Past Surgical History:  Procedure Laterality Date   ABDOMINAL HYSTERECTOMY     COLONOSCOPY N/A 11/08/2017   Procedure: COLONOSCOPY;  Surgeon: Danie Binder, MD;  Location[facility];  Service: Endoscopy;  Laterality: N/A;  2:00pm   FLEXIBLE SIGMOIDOSCOPY N/A 09/17/2017   Procedure: FLEXIBLE SIGMOIDOSCOPY;  Surgeon: Danie Binder, MD;  Location: [facility];  Service: Endoscopy;  Laterality: N/A;   HERNIA REPAIR  [id number]   umbilical   POLYPECTOMY  11/08/2017   Procedure: POLYPECTOMY;  Surgeon: Danie Binder, MD;  Location: [facility];  Service: Endoscopy;;  ascending colon (CSx1), transverse colon (CS x1), splenic flexure (HSx1)   SVD     Spontaneous vaginal delivery; x 1    Family History  Problem  Relation Age of Onset   Heart disease Mother    Hypertension Mother    Hypertension Father    Colon cancer Father 104       Passed away 67 yrs old   Hypertension Sister    Cancer Maternal Grandmother        gastric cancer   Cancer Maternal Grandfather        pancreatic cancer   Colon cancer Paternal Grandmother    Colon cancer Paternal Uncle    Colon polyps Neg Hx     Social History Reviewed with no changes to be made today.   Outpatient Medications Prior to Visit  Medication Sig Dispense Refill   albuterol (VENTOLIN HFA) 108 (90 Base) MCG/ACT inhaler Inhale 2 puffs into the lungs every 6 (six) hours as needed for wheezing or shortness of breath. 18 g 2   apixaban (ELIQUIS) 5 MG TABS tablet Take 1 tablet (5 mg total) by mouth 2 (two) times daily. 180 tablet 3   CIMZIA 2 X 200 MG/ML PSKT Inject into the skin every 14 (fourteen) days.     folic acid (FOLVITE) 1 MG tablet Take 1 tablet by mouth once daily 30 tablet 0   furosemide (LASIX) 20 MG tablet Take 1 tablet (20 mg total) by mouth daily. X 7 days then prn swelling 30 tablet 1   losartan (COZAAR) 25 MG tablet Take 1 tablet (25 mg total) by mouth daily. 90 tablet 3   methotrexate 50 MG/2ML injection Inject 20 mg into the skin once a week.     Misc. Devices (BARIATRIC ROLLATOR) MISC Please provide patient with insurance approved bariatric rollator walker. I89.0, R53.81, R26.81 1 each 0   No facility-administered medications prior to visit.    Allergies  Allergen Reactions   Abatacept Anaphylaxis    Other reaction(s): Anaphylaxis-Streptomycin Clickject Other reaction(s): Anaphylaxis-Streptomycin   Bee Venom Anaphylaxis   Augmentin [Amoxicillin-Pot Clavulanate] Hives, Itching and Other (See Comments)    Did PCN reaction causing immediate rash, facial/tongue/throat swelling, SOB or lightheadedness with hypotension: yes Did PCN reaction causing severe rash involving mucus membranes or skin necrosis: no Has patient had a PCN  reaction that required hospitalization: in hospital Has patient had a PCN reaction occurring within the last 10 years: no If all of the above answers are "NO", then may proceed with Cephalosporin use.  Pt reports no recollection of any reactions when taking penicillin in past   Latex Hives and Other (See Comments)    Local reaction (welts). Patient denies any wheezing or other reaction with latex exposure   Nulytely [Peg 3350-Kcl-Na Bicarb-Nacl]     NAUSEA AND VOMITING. MAY TOLERATE LOW VOLUME PREP.       Objective:    BP 108/69   Pulse 93   Temp 98.1 F (36.7 C) (Oral)   Ht 6' (1.829 m)   Wt (!) 358 lb 6.4 oz (162.6 kg)   LMP 01/31/2011   SpO2 99%   BMI 48.61 kg/m  Wt Readings from Last 3 Encounters:  11/30/21 Marland Kitchen)  358 lb 6.4 oz (162.6 kg)  11/09/21 (!) 362 lb 3.2 oz (164.3 kg)  10/04/21 (!) 357 lb (161.9 kg)    Physical Exam Vitals and nursing note reviewed.  Constitutional:      Appearance: She is well-developed.  HENT:     Head: Normocephalic and atraumatic.  Cardiovascular:     Rate and Rhythm: Normal rate and regular rhythm.     Heart sounds: Normal heart sounds. No murmur heard.    No friction rub. No gallop.  Pulmonary:     Effort: Pulmonary effort is normal. No tachypnea or respiratory distress.     Breath sounds: Normal breath sounds. No decreased breath sounds, wheezing, rhonchi or rales.  Chest:     Chest wall: No tenderness.  Abdominal:     General: Bowel sounds are normal.     Palpations: Abdomen is soft.  Musculoskeletal:        General: Normal range of motion.     Cervical back: Normal range of motion.     Right lower leg: Edema present.     Left lower leg: Edema present.  Skin:    General: Skin is warm and dry.  Neurological:     Mental Status: She is alert and oriented to person, place, and time.     Coordination: Coordination normal.  Psychiatric:        Behavior: Behavior normal. Behavior is cooperative.        Thought Content: Thought  content normal.        Judgment: Judgment normal.          Patient has been counseled extensively about nutrition and exercise as well as the importance of adherence with medications and regular follow-up. The patient was given clear instructions to go to ER or return to medical center if symptoms don't improve, worsen or new problems develop. The patient verbalized understanding.   Follow-up: Return in about 3 months (around 03/02/2022) for HTN.   Gildardo Pounds, FNP-BC [facility] [place], [place]   11/30/2021, 5:17 PM

## 2021-11-30 NOTE — Patient Instructions (Signed)
[facility] [address] [place], [place] [address] Phone [phone] Fax [phone] 

## 2021-12-01 LAB — HEMOGLOBIN A1C
Est. average glucose Bld gHb Est-mCnc: 117 mg/dL
Hgb A1c MFr Bld: 5.7 % — ABNORMAL HIGH (ref 4.8–5.6)

## 2021-12-15 ENCOUNTER — Telehealth: Payer: Self-pay | Admitting: Nurse Practitioner

## 2021-12-15 NOTE — Telephone Encounter (Signed)
[name] from [place], states faxed in orders for wound care, nothing showing in system of notes. She will refax toiday.

## 2021-12-20 NOTE — Telephone Encounter (Signed)
Fax received, will fax when signed.

## 2021-12-21 NOTE — Telephone Encounter (Signed)
[name] states there is also a plan of care included along w/ wound care.  Cb [phone]

## 2021-12-28 ENCOUNTER — Ambulatory Visit: Payer: Medicare HMO | Admitting: Podiatry

## 2021-12-28 NOTE — Telephone Encounter (Signed)
Faxed

## 2022-01-09 ENCOUNTER — Ambulatory Visit: Payer: Medicare HMO | Admitting: Podiatry

## 2022-01-09 ENCOUNTER — Encounter: Payer: Self-pay | Admitting: Podiatry

## 2022-01-09 DIAGNOSIS — M79675 Pain in left toe(s): Secondary | ICD-10-CM | POA: Diagnosis not present

## 2022-01-09 DIAGNOSIS — I890 Lymphedema, not elsewhere classified: Secondary | ICD-10-CM

## 2022-01-09 DIAGNOSIS — M79674 Pain in right toe(s): Secondary | ICD-10-CM

## 2022-01-09 DIAGNOSIS — B351 Tinea unguium: Secondary | ICD-10-CM | POA: Diagnosis not present

## 2022-01-09 NOTE — Progress Notes (Signed)
Subjective:  Patient ID: [name], female    DOB: 08/21/1965,  MRN: [id number]  [name] presents to clinic today for painful elongated mycotic toenails 1-5 bilaterally which are tender when wearing enclosed shoe gear. Pain is relieved with periodic professional debridement.  Chief Complaint  Patient presents with   Nail Problem    Routine foot care PCP-[name] Fleming PCP [facility]- 2 months ago   New problem(s): Patient states her left leg is more swollen and she is unable to get her shoe on. She does have a lymphedema pump at home. She is to use it twice daily, but uses it once daily, but not every day.                       PCP is Gildardo Pounds, NP , and last visit was November 30, 2021.  Allergies  Allergen Reactions   Abatacept Anaphylaxis    Other reaction(s): Anaphylaxis-Streptomycin Clickject Other reaction(s): Anaphylaxis-Streptomycin   Bee Venom Anaphylaxis   Augmentin [Amoxicillin-Pot Clavulanate] Hives, Itching and Other (See Comments)    Did PCN reaction causing immediate rash, facial/tongue/throat swelling, SOB or lightheadedness with hypotension: yes Did PCN reaction causing severe rash involving mucus membranes or skin necrosis: no Has patient had a PCN reaction that required hospitalization: in hospital Has patient had a PCN reaction occurring within the last 10 years: no If all of the above answers are "NO", then may proceed with Cephalosporin use.  Pt reports no recollection of any reactions when taking penicillin in past   Latex Hives and Other (See Comments)    Local reaction (welts). Patient denies any wheezing or other reaction with latex exposure   Nulytely [Peg 3350-Kcl-Na Bicarb-Nacl]     NAUSEA AND VOMITING. MAY TOLERATE LOW VOLUME PREP.    Review of Systems: Negative except as noted in the HPI.  Objective: No changes noted in today's physical examination.  [name] is a pleasant 56 y.o. female morbidly  obese in NAD. AAO x 3. Vascular Capillary refill time to digits immediate b/l. Palpable pedal pulses b/l LE. Pedal hair absent. No pain with calf compression b/l. Lymphedema with elephantiasis present BLE. No ischemia or gangrene noted b/l LE. No cyanosis or clubbing noted b/l LE.  Neurologic Normal speech. Oriented to person, place, and time. Protective sensation intact 5/5 intact bilaterally with 10g monofilament b/l. Vibratory sensation intact b/l.  Dermatologic No interdigital macerations noted b/l LE. Toenails 1-5 b/l elongated, discolored, dystrophic, thickened, crumbly with subungual debris and tenderness to dorsal palpation. Skin b/l lower extremities noted to be thickened and brawny consistent with lymphedema.  Orthopedic: Muscle strength 5/5 to all lower extremity muscle groups bilaterally. Sausage digits 1-5 b/l. Utilizes rollator for ambulation assistance.    Radiographs: None  Assessment/Plan: 1. Pain due to onychomycosis of toenails of both feet   2. Elephantiasis   3. Lymphedema of both lower extremities     No orders of the defined types were placed in this encounter.  -Patient was evaluated and treated. All patient's and/or POA's questions/concerns answered on today's visit. -Patient was instructed to resume her lymphedema pump as instructed. Will place referral to the Lymphedema Clinic at [facility] for management of her lymphedema. -Toenails 1-5 b/l were debrided in length and girth with sterile nail nippers and dremel without iatrogenic bleeding.  -Continue custom molded shoe gear daily. -Patient/POA to call should there be question/concern in the interim.   Return in about 3 months (around 04/11/2022).  Marzetta Board, DPM

## 2022-01-17 DIAGNOSIS — M0579 Rheumatoid arthritis with rheumatoid factor of multiple sites without organ or systems involvement: Secondary | ICD-10-CM | POA: Diagnosis not present

## 2022-01-17 DIAGNOSIS — Z6841 Body Mass Index (BMI) 40.0 and over, adult: Secondary | ICD-10-CM | POA: Diagnosis not present

## 2022-01-17 DIAGNOSIS — R5382 Chronic fatigue, unspecified: Secondary | ICD-10-CM | POA: Diagnosis not present

## 2022-01-17 DIAGNOSIS — D6861 Antiphospholipid syndrome: Secondary | ICD-10-CM | POA: Diagnosis not present

## 2022-01-17 DIAGNOSIS — M2550 Pain in unspecified joint: Secondary | ICD-10-CM | POA: Diagnosis not present

## 2022-01-17 DIAGNOSIS — E559 Vitamin D deficiency, unspecified: Secondary | ICD-10-CM | POA: Diagnosis not present

## 2022-01-17 DIAGNOSIS — I890 Lymphedema, not elsewhere classified: Secondary | ICD-10-CM | POA: Diagnosis not present

## 2022-02-07 ENCOUNTER — Ambulatory Visit (INDEPENDENT_AMBULATORY_CARE_PROVIDER_SITE_OTHER): Payer: Medicare HMO | Admitting: Gastroenterology

## 2022-02-07 ENCOUNTER — Encounter: Payer: Self-pay | Admitting: Gastroenterology

## 2022-02-07 VITALS — BP 130/70 | HR 84 | Ht 72.0 in | Wt 355.0 lb

## 2022-02-07 DIAGNOSIS — Z800 Family history of malignant neoplasm of digestive organs: Secondary | ICD-10-CM

## 2022-02-07 DIAGNOSIS — R12 Heartburn: Secondary | ICD-10-CM | POA: Diagnosis not present

## 2022-02-07 DIAGNOSIS — Z86010 Personal history of colonic polyps: Secondary | ICD-10-CM | POA: Diagnosis not present

## 2022-02-07 DIAGNOSIS — I890 Lymphedema, not elsewhere classified: Secondary | ICD-10-CM

## 2022-02-07 DIAGNOSIS — Z86711 Personal history of pulmonary embolism: Secondary | ICD-10-CM | POA: Diagnosis not present

## 2022-02-07 MED ORDER — NA SULFATE-K SULFATE-MG SULF 17.5-3.13-1.6 GM/177ML PO SOLN: 1.0000 | Freq: Once | ORAL | 0 refills | Status: AC

## 2022-02-07 NOTE — Progress Notes (Signed)
HPI : [name] is a very pleasant 56 year old female with history of recurrent PE/DVT on Eliquis and history of elephantiasis who presents to our clinic and to schedule surveillance colonoscopy.  She was last seen in this clinic January 12, 2021 to schedule colonoscopy.  Her colonoscopy was scheduled for February 25, 2021, but she had to cancel it because of the flu.  She did not reschedule with colonoscopy.  And was referred back to [place] in October of this year. Since her last visit, she was seen in the emergency department for an episode of chest pain that occurred while exercising at the gym.  She was taken to the ED by the EMS.  In the ED she was found to have negative troponins and no concerning EKG changes.  She was referred to cardiology and subsequent evaluation was unremarkable to include coronary CTA and echocardiogram.  Her chest pain was not thought to be cardiac in etiology and no further evaluation was recommended.  She reports having occasional further episodes, may be once or twice a month and not nearly as intense.  They do seem to be associated with food, and she reports pizza as a reliable exacerbate of her symptoms.  The chest pain is described as a pressure but also a burning sensation.  She denies symptoms of acid regurgitation.  No problems with hoarseness, cough or globus sensation.  She was prescribed Pepcid but she has not tried taking it.  No dysphagia.  She does continue to struggle with constipation, but prefers to use natural remedies such as apple juice, prunes or prune juice which do tend to work well for her.  ----------------------------------------------------------------------------------------------------------------------- HPI: Jan 12, 2021 [name] is a very pleasant 56 year old female with a history of recurrent PE/DVT on Eliquis and history of elephantiasis who is referred for surveillance colonoscopy.  She underwent her initial screening colonoscopy  in 2019 in which several polyps including a 10 mm polyp were removed and she was recommended to repeat colonoscopy in 3 years.  Her initial colonoscopy was incomplete due to poor bowel prep.  Pt states she was given GoLytely then, and vomited up much of it, which is why her prep was inadequate.  The repeat colonoscopy was done with Clenpiq which she tolerated well and resulted in an adequate prep.  Her father was diagnosed with colon cancer at age 69, and both his brother and mother were diagnosed with colon cancer in their 75s and 73s, respectively.  Her sister has a history of polyps. She has chronic constipation for which she drinks apple juice as needed.  She has a bowel movement on average every 4-5 days.  She used to take chocolate laxatives but they seemed to stop working .  She used to take Miralax, which did work, but she just stopped taking it. The patient has a history of PE x 2 and DVT, with her most recent clot occurring in 2018.  She states that when she had her colonoscopy, she was given lovenox injections the day before her colonoscopy while she held her Eliquis.  The patient developed severe lymphedema of her bilateral lower extremities.  This limits her physical activity, but she is ambulatory at home.  She does use a walker for ambulation outside of the home.  She has been working out with a trainer 2 times a week, 45 minutes a day with band exercises and weight resistance.  She has been able to lose 10 pounds since she started doing  this about 3 months ago.  She denies any problems with chest pain or pressure.  She had an echocardiogram 2 years ago that showed normal ejection fraction.  Denies problems with significant dyspnea or shortness of breath.  Her BMI is nearly 49, but much of her weight is secondary to her lymphedema.   Colonoscopy November 08, 2017 (Dr. Oneida Alar, [facility]) Indication: Colon cancer screening, increased risk Note: Colonoscopy was difficult due to a  tortuous colon, requiring ColoWrap device 2 polyps in transverse and ascending colon, 3 to 4 mm 10 mm pedunculated polyp in the splenic flexure  Colonoscopy September 17, 2017 (Dr. Oneida Alar, [facility]) Poor bowel prep, procedure aborted in the sigmoid colon  Past Medical History:  Diagnosis Date   Anemia    Anxiety    Arthritis    Asthma    hx as child - no prob as adult - no inhaler   Blood transfusion 01/22/11   transfusion 2 units at [facility]   Depression 08/2010   psych assessment   Dyspnea    Fibroid    Headache(784.0)    rx for imitrex - last one jan   Keloid    Lymphedema    Pulmonary embolism ([place])      Past Surgical History:  Procedure Laterality Date   ABDOMINAL HYSTERECTOMY     COLONOSCOPY N/A 11/08/2017   Procedure: COLONOSCOPY;  Surgeon: Danie Binder, MD;  [facility];  Service: Endoscopy;  Laterality: N/A;  2:00pm   FLEXIBLE SIGMOIDOSCOPY N/A 09/17/2017   Procedure: FLEXIBLE SIGMOIDOSCOPY;  Surgeon: Danie Binder, MD;  Location: [facility];  Service: Endoscopy;  Laterality: N/A;   HERNIA REPAIR  [id number]   umbilical   POLYPECTOMY  11/08/2017   Procedure: POLYPECTOMY;  Surgeon: Danie Binder, MD;  Location: [facility];  Service: Endoscopy;;  ascending colon (CSx1), transverse colon (CS x1), splenic flexure (HSx1)   SVD     Spontaneous vaginal delivery; x 1   Family History  Problem Relation Age of Onset   Heart disease Mother    Hypertension Mother    Hypertension Father    Colon cancer Father 49       Passed away 53 yrs old   Hypertension Sister    Cancer Maternal Grandmother        gastric cancer   Cancer Maternal Grandfather        pancreatic cancer   Colon cancer Paternal Grandmother    Colon cancer Paternal Uncle    Colon polyps Neg Hx    Social History   Tobacco Use   Smoking status: Never   Smokeless tobacco: Never  Vaping Use   Vaping Use: Never used  Substance Use Topics   Alcohol use: No   Drug use: No   Current  Outpatient Medications  Medication Sig Dispense Refill   albuterol (VENTOLIN HFA) 108 (90 Base) MCG/ACT inhaler Inhale 2 puffs into the lungs every 6 (six) hours as needed for wheezing or shortness of breath. 18 g 2   CIMZIA 2 X 200 MG/ML PSKT Inject into the skin every 14 (fourteen) days.     famotidine (PEPCID) 40 MG tablet Take 1 tablet (40 mg total) by mouth daily. 90 tablet 1   folic acid (FOLVITE) 1 MG tablet Take 1 tablet by mouth once daily 30 tablet 0   furosemide (LASIX) 20 MG tablet Take 1 tablet (20 mg total) by mouth daily. X 7 days then prn swelling 30 tablet 1  losartan (COZAAR) 25 MG tablet Take 1 tablet (25 mg total) by mouth daily. 90 tablet 3   methotrexate 50 MG/2ML injection Inject 20 mg into the skin once a week.     Misc. Devices (BARIATRIC ROLLATOR) MISC Please provide patient with insurance approved bariatric rollator walker. I89.0, R53.81, R26.81 1 each 0   apixaban (ELIQUIS) 5 MG TABS tablet Take 1 tablet (5 mg total) by mouth 2 (two) times daily. 180 tablet 3   No current facility-administered medications for this visit.   Allergies  Allergen Reactions   Abatacept Anaphylaxis    Other reaction(s): Anaphylaxis-Streptomycin Clickject Other reaction(s): Anaphylaxis-Streptomycin   Bee Venom Anaphylaxis   Augmentin [Amoxicillin-Pot Clavulanate] Hives, Itching and Other (See Comments)    Did PCN reaction causing immediate rash, facial/tongue/throat swelling, SOB or lightheadedness with hypotension: yes Did PCN reaction causing severe rash involving mucus membranes or skin necrosis: no Has patient had a PCN reaction that required hospitalization: in hospital Has patient had a PCN reaction occurring within the last 10 years: no If all of the above answers are "NO", then may proceed with Cephalosporin use.  Pt reports no recollection of any reactions when taking penicillin in past   Latex Hives and Other (See Comments)    Local reaction (welts). Patient denies any  wheezing or other reaction with latex exposure   Nulytely [Peg 3350-Kcl-Na Bicarb-Nacl]     NAUSEA AND VOMITING. MAY TOLERATE LOW VOLUME PREP.     Review of Systems: All systems reviewed and negative except where noted in HPI.    No results found.  Physical Exam: BP 130/70   Pulse 84   Ht 6' (1.829 m)   Wt (!) 355 lb (161 kg)   LMP 01/31/2011   BMI 48.15 kg/m  Constitutional: Pleasant,well-developed, African-American female in no acute distress. HEENT: Normocephalic and atraumatic. Conjunctivae are normal. No scleral icterus. Cardiovascular: Normal rate, regular rhythm.  Pulmonary/chest: Effort normal and breath sounds normal. No wheezing, rales or rhonchi. Abdominal: Soft, nondistended, nontender. Bowel sounds active throughout. There are no masses palpable. No hepatomegaly. Extremities: Bilateral severe elephantiasis, right foot in orthotic boot.  No open wounds or weeping. Neurological: Alert and oriented to person place and time. Skin: Skin is warm and dry. No rashes noted.  Bilateral hyperpigmentation/hyperkeratosis lower extremity skin Psychiatric: Normal mood and affect. Behavior is normal.  CBC    Component Value Date/Time   WBC 6.5 10/04/2021 2135   RBC 3.80 (L) 10/04/2021 2135   HGB 11.1 (L) 10/04/2021 2135   HGB 12.0 07/12/2020 1628   HCT 36.4 10/04/2021 2135   HCT 37.1 07/12/2020 1628   PLT 304 10/04/2021 2135   PLT 264 07/12/2020 1628   MCV 95.8 10/04/2021 2135   MCV 91 07/12/2020 1628   MCH 29.2 10/04/2021 2135   MCHC 30.5 10/04/2021 2135   RDW 16.1 (H) 10/04/2021 2135   RDW 14.0 07/12/2020 1628   LYMPHSABS 1.1 10/04/2021 2135   LYMPHSABS 1.5 03/09/2017 1421   MONOABS 0.7 10/04/2021 2135   EOSABS 0.2 10/04/2021 2135   EOSABS 0.2 03/09/2017 1421   BASOSABS 0.0 10/04/2021 2135   BASOSABS 0.0 03/09/2017 1421    CMP     Component Value Date/Time   NA 140 11/16/2021 1325   K 3.9 11/16/2021 1325   CL 105 11/16/2021 1325   CO2 20 11/16/2021 1325    GLUCOSE 116 (H) 11/16/2021 1325   GLUCOSE 106 (H) 10/04/2021 2135   BUN 10 11/16/2021 1325   CREATININE 0.94 11/16/2021  1325   CREATININE 0.89 05/13/2018 1520   CALCIUM 9.6 11/16/2021 1325   PROT 8.5 (H) 10/04/2021 2135   PROT 7.6 01/14/2021 1420   ALBUMIN 3.9 10/04/2021 2135   ALBUMIN 4.4 01/14/2021 1420   AST 19 10/04/2021 2135   AST 11 (L) 05/13/2018 1520   ALT 13 10/04/2021 2135   ALT 8 05/13/2018 1520   ALKPHOS 44 10/04/2021 2135   BILITOT 0.9 10/04/2021 2135   BILITOT 1.1 01/14/2021 1420   BILITOT 0.8 05/13/2018 1520   GFRNONAA >60 10/04/2021 2135   GFRNONAA >60 05/13/2018 1520   GFRAA 82 01/16/2020 1605   GFRAA >60 05/13/2018 1520     ASSESSMENT AND PLAN: 56 year old female with elephantiasis and history of recurrent PE/DVT on Eliquis, as well as personal history of polyps, due for surveillance colonoscopy.  She was recently evaluated by cardiology for recurrent chest pain which was felt to be noncardiac in etiology.  Based on her association with food, her chest pain is most likely reflux related.  Given the infrequent occurrence of her symptoms, I do not recommend initiation of a PPI.  Recommend she try taking Pepcid as needed when she has symptoms. Will schedule patient for colonoscopy in the [place].  Although her BMI is very close to 50, most of this is related to her lower extremity lymphedema.  She had a very reassuring cardiac evaluation.  Although her mobility is limited, she reports she is able to transfer in and out of her bed independently. Will plan to hold her Eliquis 1 day prior to her colonoscopy.  History of colon polyps - Colonoscopy - Hold Eliquis 1 day prior to colonoscopy - Given redundant colon with looping noted on previous colonoscopy plan for utilization of ColoWrap device  GERD - Lifestyle modifications, Pepcid as needed  The details, risks (including bleeding, perforation, infection, missed lesions, medication reactions and possible hospitalization  or surgery if complications occur), benefits, and alternatives to colonoscopy with possible biopsy and possible polypectomy were discussed with the patient and she consents to proceed.   Eliyahu Bille E. Candis Schatz, MD [facility]  CC:  Gildardo Pounds, NP

## 2022-02-07 NOTE — Patient Instructions (Signed)
_______________________________________________________  If you are age 54 or older, your body mass index should be between 23-30. Your Body mass index is 48.15 kg/m. If this is out of the aforementioned range listed, please consider follow up with your Primary Care Provider.  If you are age 50 or younger, your body mass index should be between 19-25. Your Body mass index is 48.15 kg/m. If this is out of the aformentioned range listed, please consider follow up with your Primary Care Provider.   You have been scheduled for a colonoscopy. Please follow written instructions given to you at your visit today.  Please pick up your prep supplies at the pharmacy within the next 1-3 days. If you use inhalers (even only as needed), please bring them with you on the day of your procedure.  Start Miralax daily.  Start taking Pepcid start taking as needed for chest pain.  The [place] [facility] providers would like to encourage you to use [facility] to communicate with providers for non-urgent requests or questions.  Due to long hold times on the telephone, sending your provider a message by [facility] may be a faster and more efficient way to get a response.  Please allow 48 business hours for a response.  Please remember that this is for non-urgent requests.   It was a pleasure to see you today!  Thank you for trusting me with your gastrointestinal care!    Scott E.Candis Schatz, MD

## 2022-03-01 ENCOUNTER — Encounter: Payer: Self-pay | Admitting: Occupational Therapy

## 2022-03-01 ENCOUNTER — Ambulatory Visit: Payer: Medicare HMO | Attending: Podiatry | Admitting: Occupational Therapy

## 2022-03-01 DIAGNOSIS — I890 Lymphedema, not elsewhere classified: Secondary | ICD-10-CM | POA: Insufficient documentation
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
## 2022-03-06 ENCOUNTER — Ambulatory Visit: Payer: Medicare HMO | Attending: Nurse Practitioner | Admitting: Nurse Practitioner

## 2022-03-06 ENCOUNTER — Encounter: Payer: Self-pay | Admitting: Nurse Practitioner

## 2022-03-06 VITALS — BP 123/68 | HR 93 | Ht 72.0 in | Wt 361.8 lb

## 2022-03-06 DIAGNOSIS — I10 Essential (primary) hypertension: Secondary | ICD-10-CM | POA: Diagnosis not present

## 2022-03-06 DIAGNOSIS — Z1231 Encounter for screening mammogram for malignant neoplasm of breast: Secondary | ICD-10-CM

## 2022-03-06 DIAGNOSIS — B49 Unspecified mycosis: Secondary | ICD-10-CM

## 2022-03-06 DIAGNOSIS — R7989 Other specified abnormal findings of blood chemistry: Secondary | ICD-10-CM

## 2022-03-06 DIAGNOSIS — E785 Hyperlipidemia, unspecified: Secondary | ICD-10-CM | POA: Diagnosis not present

## 2022-03-06 DIAGNOSIS — M069 Rheumatoid arthritis, unspecified: Secondary | ICD-10-CM

## 2022-03-06 DIAGNOSIS — I890 Lymphedema, not elsewhere classified: Secondary | ICD-10-CM | POA: Insufficient documentation

## 2022-03-06 MED ORDER — CLOTRIMAZOLE 1 % EX CREA: 1.0000 | TOPICAL_CREAM | Freq: Two times a day (BID) | CUTANEOUS | 1 refills | Status: DC

## 2022-03-06 NOTE — Progress Notes (Signed)
Assessment & Plan:  [name] was seen today for hypertension.  Diagnoses and all orders for this visit:  Primary hypertension -     CMP14+EGFR Continue all antihypertensives as prescribed.  Reminded to bring in blood pressure log for follow  up appointment.  RECOMMENDATIONS: DASH/Mediterranean Diets are healthier choices for HTN.    Fungal infection -     clotrimazole (CLOTRIMAZOLE ANTI-FUNGAL) 1 % cream; Apply 1 Application topically 2 (two) times daily.  Dyslipidemia, goal LDL below 100 -     Lipid panel INSTRUCTIONS: Work on a low fat, heart healthy diet and participate in regular aerobic exercise program by working out at least 150 minutes per week; 5 days a week-30 minutes per day. Avoid red meat/beef/steak,  fried foods. junk foods, sodas, sugary drinks, unhealthy snacking, alcohol and smoking.  Drink at least 80 oz of water per day and monitor your carbohydrate intake daily.    Abnormal CBC -     CBC with Differential  Breast cancer screening by mammogram -     MM 3D SCREEN BREAST BILATERAL; Future    Patient has been counseled on age-appropriate routine health concerns for screening and prevention. These are reviewed and up-to-date. Referrals have been placed accordingly. Immunizations are up-to-date or declined.    Subjective:   Chief Complaint  Patient presents with   Hypertension   Hypertension Pertinent negatives include no blurred vision, chest pain, headaches, malaise/fatigue, palpitations or shortness of breath.   [name] 57 y.o. female presents to office today for HTN  Patient has been counseled on age-appropriate routine health concerns for screening and prevention. These are reviewed and up-to-date. Referrals have been placed accordingly. Immunizations are up-to-date or declined.     MAMMOGRAM: OVERDUE. Referral placed COLONOSCOPY: overdue. Scheduled PAP SMEAR: Hysterectomy: 2012  HTN Blood pressure is well controlled. She is currently  taking losartan 25 mg daily as prescribed.  BP Readings from Last 3 Encounters:  03/06/22 123/68  02/07/22 130/70  11/30/21 108/69    Insomnia Endorses difficulty falling asleep and staying asleep. Has not tried any OTC medications. I have recommended she start with melatonin first. Symptoms have been present "for a while".  She is currently being followed by OT for lymphedema therapy. States she was told there was a fungal infection of the posterior lower left leg. She has been using OTC lotrimin and there were no visible signs of fungal rash noted on the leg today. I did however prescribe her a fungal cream for future use.   Review of Systems  Constitutional:  Negative for fever, malaise/fatigue and weight loss.  HENT: Negative.  Negative for nosebleeds.   Eyes: Negative.  Negative for blurred vision, double vision and photophobia.  Respiratory: Negative.  Negative for cough and shortness of breath.   Cardiovascular:  Positive for leg swelling. Negative for chest pain and palpitations.  Gastrointestinal: Negative.  Negative for heartburn, nausea and vomiting.  Musculoskeletal: Negative.  Negative for myalgias.  Neurological: Negative.  Negative for dizziness, focal weakness, seizures and headaches.  Psychiatric/Behavioral: Negative.  Negative for suicidal ideas.     Past Medical History:  Diagnosis Date   Anemia    Anxiety    Arthritis    Asthma    hx as child - no prob as adult - no inhaler   Blood transfusion 01/22/11   transfusion 2 units at [facility]   Depression 08/2010   psych assessment   Dyspnea    Fibroid    Headache(784.0)  rx for imitrex - last one jan   Keloid    Lymphedema    Pulmonary embolism [facility])     Past Surgical History:  Procedure Laterality Date   ABDOMINAL HYSTERECTOMY     COLONOSCOPY N/A 11/08/2017   Procedure: COLONOSCOPY;  Surgeon: Danie Binder, MD;  [facility];  Service: Endoscopy;  Laterality: N/A;  2:00pm   FLEXIBLE SIGMOIDOSCOPY  N/A 09/17/2017   Procedure: FLEXIBLE SIGMOIDOSCOPY;  Surgeon: Danie Binder, MD;  [facility];  Service: Endoscopy;  Laterality: N/A;   HERNIA REPAIR  [id number]   umbilical   POLYPECTOMY  11/08/2017   Procedure: POLYPECTOMY;  Surgeon: Danie Binder, MD;  [facility];  Service: Endoscopy;;  ascending colon (CSx1), transverse colon (CS x1), splenic flexure (HSx1)   SVD     Spontaneous vaginal delivery; x 1    Family History  Problem Relation Age of Onset   Heart disease Mother    Hypertension Mother    Hypertension Father    Colon cancer Father 57       Passed away 51 yrs old   Hypertension Sister    Cancer Maternal Grandmother        gastric cancer   Cancer Maternal Grandfather        pancreatic cancer   Colon cancer Paternal Grandmother    Colon cancer Paternal Uncle    Colon polyps Neg Hx     Social History Reviewed with no changes to be made today.   Outpatient Medications Prior to Visit  Medication Sig Dispense Refill   albuterol (VENTOLIN HFA) 108 (90 Base) MCG/ACT inhaler Inhale 2 puffs into the lungs every 6 (six) hours as needed for wheezing or shortness of breath. 18 g 2   apixaban (ELIQUIS) 5 MG TABS tablet Take 1 tablet (5 mg total) by mouth 2 (two) times daily. 180 tablet 3   CIMZIA 2 X 200 MG/ML PSKT Inject into the skin every 14 (fourteen) days.     famotidine (PEPCID) 40 MG tablet Take 1 tablet (40 mg total) by mouth daily. 90 tablet 1   folic acid (FOLVITE) 1 MG tablet Take 1 tablet by mouth once daily 30 tablet 0   furosemide (LASIX) 20 MG tablet Take 1 tablet (20 mg total) by mouth daily. X 7 days then prn swelling 30 tablet 1   losartan (COZAAR) 25 MG tablet Take 1 tablet (25 mg total) by mouth daily. 90 tablet 3   methotrexate 50 MG/2ML injection Inject 20 mg into the skin once a week.     Misc. Devices (BARIATRIC ROLLATOR) MISC Please provide patient with insurance approved bariatric rollator walker. I89.0, R53.81, R26.81 1 each 0   No  facility-administered medications prior to visit.    Allergies  Allergen Reactions   Abatacept Anaphylaxis    Other reaction(s): Anaphylaxis-Streptomycin Clickject Other reaction(s): Anaphylaxis-Streptomycin   Bee Venom Anaphylaxis   Augmentin [Amoxicillin-Pot Clavulanate] Hives, Itching and Other (See Comments)    Did PCN reaction causing immediate rash, facial/tongue/throat swelling, SOB or lightheadedness with hypotension: yes Did PCN reaction causing severe rash involving mucus membranes or skin necrosis: no Has patient had a PCN reaction that required hospitalization: in hospital Has patient had a PCN reaction occurring within the last 10 years: no If all of the above answers are "NO", then may proceed with Cephalosporin use.  Pt reports no recollection of any reactions when taking penicillin in past   Latex Hives and Other (See Comments)  Local reaction (welts). Patient denies any wheezing or other reaction with latex exposure   Nulytely [Peg 3350-Kcl-Na Bicarb-Nacl]     NAUSEA AND VOMITING. MAY TOLERATE LOW VOLUME PREP.       Objective:    BP 123/68   Pulse 93   Ht 6' (1.829 m)   Wt (!) 361 lb 12.8 oz (164.1 kg)   LMP 01/31/2011   SpO2 98%   BMI 49.07 kg/m  Wt Readings from Last 3 Encounters:  03/06/22 (!) 361 lb 12.8 oz (164.1 kg)  02/07/22 (!) 355 lb (161 kg)  11/30/21 (!) 358 lb 6.4 oz (162.6 kg)    Physical Exam Vitals and nursing note reviewed.  Constitutional:      Appearance: She is well-developed.  HENT:     Head: Normocephalic and atraumatic.  Cardiovascular:     Rate and Rhythm: Normal rate and regular rhythm.     Heart sounds: Normal heart sounds. No murmur heard.    No friction rub. No gallop.  Pulmonary:     Effort: Pulmonary effort is normal. No tachypnea or respiratory distress.     Breath sounds: Normal breath sounds. No decreased breath sounds, wheezing, rhonchi or rales.  Chest:     Chest wall: No tenderness.  Abdominal:     General:  Bowel sounds are normal.     Palpations: Abdomen is soft.  Musculoskeletal:        General: Normal range of motion.     Cervical back: Normal range of motion.     Right lower leg: Edema present.     Left lower leg: Edema present.  Skin:    General: Skin is warm and dry.  Neurological:     Mental Status: She is alert and oriented to person, place, and time.     Coordination: Coordination normal.  Psychiatric:        Behavior: Behavior normal. Behavior is cooperative.        Thought Content: Thought content normal.        Judgment: Judgment normal.          Patient has been counseled extensively about nutrition and exercise as well as the importance of adherence with medications and regular follow-up. The patient was given clear instructions to go to ER or return to medical center if symptoms don't improve, worsen or new problems develop. The patient verbalized understanding.   Follow-up: Return in about 4 months (around 07/05/2022).   Gildardo Pounds, FNP-BC [facility] [place], [place]   03/06/2022, 12:08 PM

## 2022-03-07 LAB — CMP14+EGFR
ALT: 13 IU/L (ref 0–32)
AST: 20 IU/L (ref 0–40)
Albumin/Globulin Ratio: 1.1 — ABNORMAL LOW (ref 1.2–2.2)
Albumin: 4.2 g/dL (ref 3.8–4.9)
Alkaline Phosphatase: 67 IU/L (ref 44–121)
BUN/Creatinine Ratio: 12 (ref 9–23)
BUN: 11 mg/dL (ref 6–24)
Bilirubin Total: 0.8 mg/dL (ref 0.0–1.2)
CO2: 23 mmol/L (ref 20–29)
Calcium: 9.6 mg/dL (ref 8.7–10.2)
Chloride: 104 mmol/L (ref 96–106)
Creatinine, Ser: 0.95 mg/dL (ref 0.57–1.00)
Globulin, Total: 3.8 g/dL (ref 1.5–4.5)
Glucose: 98 mg/dL (ref 70–99)
Potassium: 4.2 mmol/L (ref 3.5–5.2)
Sodium: 140 mmol/L (ref 134–144)
Total Protein: 8 g/dL (ref 6.0–8.5)
eGFR: 71 mL/min/{1.73_m2} (ref >59)

## 2022-03-07 LAB — CBC WITH DIFFERENTIAL/PLATELET
Basophils Absolute: 0 10*3/uL (ref 0.0–0.2)
Basos: 1 %
EOS (ABSOLUTE): 0.5 10*3/uL — ABNORMAL HIGH (ref 0.0–0.4)
Eos: 8 %
Hematocrit: 35.8 % (ref 34.0–46.6)
Hemoglobin: 11.5 g/dL (ref 11.1–15.9)
Immature Grans (Abs): 0 10*3/uL (ref 0.0–0.1)
Immature Granulocytes: 0 %
Lymphocytes Absolute: 1.7 10*3/uL (ref 0.7–3.1)
Lymphs: 31 %
MCH: 29.2 pg (ref 26.6–33.0)
MCHC: 32.1 g/dL (ref 31.5–35.7)
MCV: 91 fL (ref 79–97)
Monocytes Absolute: 0.7 10*3/uL (ref 0.1–0.9)
Monocytes: 13 %
Neutrophils Absolute: 2.6 10*3/uL (ref 1.4–7.0)
Neutrophils: 47 %
Platelets: 277 10*3/uL (ref 150–450)
RBC: 3.94 x10E6/uL (ref 3.77–5.28)
RDW: 13.5 % (ref 11.7–15.4)
WBC: 5.5 10*3/uL (ref 3.4–10.8)

## 2022-03-07 LAB — LIPID PANEL
Chol/HDL Ratio: 2.9 ratio (ref 0.0–4.4)
Cholesterol, Total: 164 mg/dL (ref 100–199)
HDL: 56 mg/dL (ref >39)
LDL Chol Calc (NIH): 95 mg/dL (ref 0–99)
Triglycerides: 64 mg/dL (ref 0–149)
VLDL Cholesterol Cal: 13 mg/dL (ref 5–40)

## 2022-03-14 ENCOUNTER — Ambulatory Visit: Payer: Medicare HMO | Admitting: Occupational Therapy

## 2022-03-14 DIAGNOSIS — I890 Lymphedema, not elsewhere classified: Secondary | ICD-10-CM

## 2022-03-15 ENCOUNTER — Ambulatory Visit: Payer: Medicare HMO | Admitting: Occupational Therapy

## 2022-03-15 DIAGNOSIS — I890 Lymphedema, not elsewhere classified: Secondary | ICD-10-CM | POA: Diagnosis not present

## 2022-03-15 NOTE — Therapy (Signed)
OUTPATIENT OCCUPATIONAL THERAPY TREATMENT NOTE BILATERAL  LOWER EXTREMITY LYMPHEDEMA  Patient Name: [name] MRN: [id number] DOB:1965/10/18, 57 y.o., female Today's Date: 03/17/2022  END OF SESSION:  OT End of Session - 03/14/22 1216     Visit Number 2    Number of Visits 36    Date for OT Re-Evaluation 05/30/22    OT Start Time 0315    OT Stop Time 0415    OT Time Calculation (min) 60 min    Activity Tolerance Patient tolerated treatment well;No increased pain    Behavior During Therapy WFL for tasks assessed/performed                Past Medical History:  Diagnosis Date   Anemia    Anxiety    Arthritis    Asthma    hx as child - no prob as adult - no inhaler   Blood transfusion 01/22/11   transfusion 2 units at [facility]   Depression 08/2010   psych assessment   Dyspnea    Fibroid    Headache(784.0)    rx for imitrex - last one jan   Keloid    Lymphedema    Pulmonary embolism ([place])    Past Surgical History:  Procedure Laterality Date   ABDOMINAL HYSTERECTOMY     COLONOSCOPY N/A 11/08/2017   Procedure: COLONOSCOPY;  Surgeon: Danie Binder, MD;  [facility];  Service: Endoscopy;  Laterality: N/A;  2:00pm   FLEXIBLE SIGMOIDOSCOPY N/A 09/17/2017   Procedure: FLEXIBLE SIGMOIDOSCOPY;  Surgeon: Danie Binder, MD;  [facility];  Service: Endoscopy;  Laterality: N/A;   HERNIA REPAIR  [id number]   umbilical   POLYPECTOMY  11/08/2017   Procedure: POLYPECTOMY;  Surgeon: Danie Binder, MD;  [facility];  Service: Endoscopy;;  ascending colon (CSx1), transverse colon (CS x1), splenic flexure (HSx1)   SVD     Spontaneous vaginal delivery; x 1   Patient Active Problem List   Diagnosis Date Noted   Rheumatoid arthritis ([place]) 03/06/2022   Lymphedema 03/06/2022   Morbid obesity ([place]) 09/14/2021   Chest pain 04/07/2021   Screening for malignant neoplasm of colon 03/09/2021   Constipation 03/09/2021   Iron deficiency anemia  03/09/2021   Body mass index (BMI) 45.0-49.9, adult ([place]) 08/20/2020   Chronic fatigue syndrome 08/20/2020   Polyarthralgia 08/20/2020   Vitamin D deficiency 08/20/2020   Cellulitis 12/31/2018   Elephantiasis 12/31/2018   Ischemic chest pain ([place]) 12/31/2018   History of pulmonary embolus (PE) 12/31/2018   Physical deconditioning 08/27/2018   Adenopathy 09/15/2017   Family history of colon cancer 08/01/2017   Taking multiple medications for chronic disease 08/01/2017   Axillary lymphadenopathy 01/26/2017   Urinary incontinence 01/25/2017   Hypokalemia 01/25/2017   Lobar pneumonia ([place]) 01/25/2017   Pressure ulcer of sacral region, stage 2 ([place]) 01/25/2017   Abnormal ECG 01/29/2013   Dyspnea    Menorrhagia with regular cycle 02/22/2011   Anemia 02/22/2011    PCP: Archie Patten, NP  REFERRING PROVIDER: Acquanetta Sit, DPM  REFERRING DIAG: I89.0  THERAPY DIAG:  Lymphedema, not elsewhere classified   ONSET DATE: 2018  SUBJECTIVE:       SUBJECTIVE STATEMENT: [name] presents to OT for initial  CDT Rx visit to address severe, stage III lymphedema. Pt is accompanied by her adult daughter who will assist her mother with compression wrapping between visits. Pt has no new concerns . Pain and physical condition are unchanged since 03/14/22  when initial eval ws completed.                                                                                PERTINENT HISTORY:  Severe, stage IV, BLE Lymphedema (Elephantiasis N[place]ras [place]). ENV is a complication of sever, chronic, progressive Lymphedema.  HTN, blood clot disorder with PE x 2 in 2018, RA, Hx recurrent cellulitis, severe morbid obesity (body mass index > 40.4 kg/m2).RA, deconditioning, hx pressure ulcer sacrum, hx non-pressure leg wound, hx depression hx chronic fatigue, hx keloids PAIN:  Are you having pain? Yes: NPRS scale: not rated/10 Pain location: BLE Pain description: throbbing, pulsating, aching, heavy,  full, tight Aggravating factors: sleeping in chair with feet down, walking, standing Relieving factors: exercise, elevation, antibacterial soap, antibiotic powder  PRECAUTIONS: Fall, recurrent infections, hx non-pressure leg wound; current fungal infection-legs  FALLS:  Has patient fallen since last session? NO  LIVING ENVIRONMENT: Lives with:  mother Lives in: House/apartment Has following equipment at home:  Bariatric walker  IMPAIRMENTS AND FUNCTIONAL LIMITATIONS: difficulty walking, chronic, progressive limb swelling with associated pain and disfigurement, impaired balance, generalized debility, decreased BLE AROM, decreased strength, impaired endurance  impaired basic and instrumental ADLs ( bathing, dressing, grooming (skin inspection, skin and nail care), impaired functional ambulation, transfers and mobility, impaired home management, shopping, unable to cook and prep food, unable to drive, dependent for wc propulsion, unable to work, impaired social participation, impaired body image  HAND DOMINANCE: right   PRIOR LEVEL OF FUNCTION: Requires assistive devices and varying levels of assistance with basic and instrumental ADLs, Needs assistance with functional ambulation, all transfers and functional mobility. Unable to work. Housebound due to impaired mobility and relies on others for transportation  PATIENT GOALS: keep swelling from getting worse so I don't lose my leg   OBJECTIVE: Severe Stage III, Bilateral Lower Extremity Lymphedema ( Elephantiasis Nostras)   OBSERVATIONS / ASSESSMENTS:   FOTO (functional outcomes measure): Intake score 03/01/22 44/100%  Lymphedema Life Impact Scale (LLIS) TBA  BLE COMPARATIVE LIMB VOLUMETRICS  BLE COMPARATIVE LIMB VOLUMETRICS:  LANDMARK RIGHT  TBA  R LEG (A-D) N/A  R THIGH (E-G) ml  R FULL LIMB (A-G) ml  Limb Volume differential (LVD)  %  Volume change since initial %  Volume change overall V  (Blank rows = not tested)  LANDMARK  LEFT  initial 03/14/22  R LEG (A-D) 14499.2 ml  R THIGH (E-G) 17195.4 ml  R FULL LIMB (A-G) 31694.6 ml  Limb Volume differential (LVD)  %  Volume change since initial %  Volume change overall %  (Blank rows = not tested)     TODAY'S TREATMENT:  Pt and family edu for Treatment goals, multilayer compression wrapping, outcome of initial limb volumetrics, and importance of skin care Initial limb volumetrics LLE only Multilayer compression wraps to LLE- knee length   PATIENT EDUCATION:  Education details: Provided basic level education regarding lymphatic structure and function, etiology, onset patterns, stages of progression, and prevention to limit infection risk, worsening condition and further functional decline. Pt edu for aught interaction between blood circulatory system and lymphatic circulation.Discussed  impact of gravity and co-morbidities on lymphatic function. Outlined Complete Decongestive Therapy (CDT)  as standard of care and provided in depth information regarding 4 primary components of Intensive and Self Management Phases, including Manual Lymph Drainage (MLD), compression wrapping and garments, skin care, and therapeutic exercise. Pilar Plate discussion with re need for frequent attendance and high burden of care when caregiver is needed, impact of co morbidities. We discussed  the chronic, progressive nature of lymphedema and Importance of daily, ongoing LE self-care essential for limiting progression and infection risk.   Person educated: Patient and [place] Education method: Explanation, Demonstration, Verbal cues, and Handouts Education comprehension: verbalized understanding and returned demonstration  HOME EXERCISE PROGRAM: Daily , multilayer compression bandages to one limb at a time during Intensive Phase CDT: LLE: Apply single layer  of .4 cm thick x 1 meter long  x 12 cm wide Rosidal soft foam over cotton stockinett starting at base of toes and extending to popliteal fossa / tibial tuberosity. Apply short stretch compression wraps, 1 8 cm wide, 2 12 cm wide and 2 15 cm wide, in gradient configuration. Tape final layer on top where Pt can reach to remove. Apply single layer of stretch net over tape.  Daily day and HOS  compression garments during Self-management Phase. Daily skin care Daily lymphatic pumping there ex Daily simple self-MLD  ASSESSMENT:  CLINICAL IMPRESSION: Pt's daughter and Pt fully engaged throughout OT Rx session. Malodorous L leg persists today despite Pt using antifungal topical. Completed initial limb volumetrics for LLE and explained process and data to Pt and family member. Applied inmitial knee length cmompression wraps using gradient techniques and commenced into level edu for compression. Good start today. Cont as per POC.  OBJECTIVE IMPAIRMENTS: Abnormal gait, decreased activity tolerance, decreased balance, decreased endurance, decreased knowledge of condition, decreased knowledge of use of DME, decreased mobility, difficulty walking, decreased ROM, decreased strength, dizziness, hypomobility, increased edema, increased fascial restrictions, impaired flexibility, impaired sensation, postural dysfunction, obesity, pain, and chronic, progressive swelling .   ACTIVITY LIMITATIONS: carrying, lifting, bending, standing, squatting, stairs, transfers, bed mobility, continence, bathing, toileting, dressing, hygiene/grooming, and caring for others  PARTICIPATION LIMITATIONS: meal prep, cleaning, laundry, driving, shopping, community activity, occupation, yard work, school, church, and caring for others  PERSONAL FACTORS: 3+ contributing Co morbidities, limited transportation, consistent, ongoing availability of caregivers, financial limitations are also affecting patient's functional outcome.   REHAB  POTENTIAL: Poor without consistent, daily, ongoing caregiver assistance. Guarded, at best, with consistent, ongoing assistance due to the advanced nature of the condition . Lymphedema management has a high burden of care, especially for Patients who have difficulty, or who are unable to reach their distal legs and feet to apply bandages, compression garments, to bathe, inspect skin and groom nails. In this case because Pt is unable to perform these activities, daily caregiver assistance with all lower extremity lymphedema self-care home program components is essential for achieving clinical success and ongoing self-management. Without consistent, ongoing, daily caregiver assistance for compression bandaging and garments, skin care, and MLD  Pt's prognosis is poor. With consistent caregiver assistance with lymphedema home program Pt's prognosis for improved lymphedema control and reducing infection risk is guarded, at best, due to the advanced stage, multiple contributing co-morbidities, and sedentary lifestyle.    GOALS: Goals reviewed with patient? YES  SHORT TERM GOALS: Target date: 6th OT visit   Pt will demonstrate understanding of lymphedema precautions and prevention strategies with modified independence using a printed reference to identify at least 5 precautions and discussing how s/he may implement them into daily life to reduce risk of progression with modified assistance. Baseline: Max A Goal status: INITIAL  2.   With Max caregiver assistance Pt will be able to apply multilayer, knee length, compression wraps using gradient techniques to decrease limb volume, to limit infection risk, and to limit lymphedema progression.  Baseline: Dependent Goal status: INITIAL  LONG TERM GOALS: Target date: 05/30/22  Given this patient's Intake score of 44/100% on the functional outcomes FOTO tool, patient will experience an increase in function of 3  points to improve basic and instrumental ADLs  performance, including lymphedema self-care. Baseline: 44/100 Goal status: INITIAL  2.  Given this patient's Intake score of TBA % on the Lymphedema Life Impact Scale (LLIS), patient will experience a reduction of at least 5% in her perceived level of functional impairment resulting from lymphedema to improve functional performance and quality of life (QOL). Baseline: TBA Goal status: INITIAL  3.  With max caregiver assistance Pt will be able to don and doff appropriate compression garments and/or devices to control BLE lymphedema and to limit progression.  Baseline: Dependent Goal status: INITIAL  4.  With tolerance building strategies over time  Pt will be able to tolerate compression bandages and garments for at least 24 hours  to control BLE lymphedema and to limit progression.  Baseline: Max A Goal status: INITIAL  5.  With Max caregiver assistance Pt will achieve and sustain no less than 85% compliance with all LE self-care home program components throughout CDT, including modified simple self-MLD, daily skin care and inspection, lymphatic pumping the ex and appropriate compression to limit lymphedema progression and to limit further functional decline. Baseline: Dependent Goal status: INITIAL  6.  Pt will diligently perform skin care regime prescribed by her doctor to eliminate fungal infection and prevent reoccurrence for 90 days Baseline: Dependent Goal status: INITIAL   OT PLAN OF CARE:  PT FREQUENCY: 2x/week  PT DURATION: other: 12 weeks and PRN  PLANNED INTERVENTIONS: Therapeutic exercises, Therapeutic activity, Patient/Family education, Self Care, DME instructions, Manual lymph drainage, Compression bandaging, Vasopneumatic device, Manual therapy, and skin care  PLAN FOR NEXT SESSION:  Commence hands on teaching for compression wrapping.  Complete initial RLE volumetrics ASAP  Andrey Spearman, MS, OTR/L, CLT-LANA 03/17/22 12:19 PM

## 2022-03-16 ENCOUNTER — Encounter: Payer: Self-pay | Admitting: Occupational Therapy

## 2022-03-16 NOTE — Therapy (Addendum)
OUTPATIENT OCCUPATIONAL THERAPY TREATMENT NOTE BILATERAL  LOWER EXTREMITY LYMPHEDEMA  Patient Name: [name] MRN: [id number] DOB:1965/11/05, 57 y.o., female Today's Date: 03/16/2022  END OF SESSION:  OT End of Session - 03/15/22 1257     Visit Number 3   Number of Visits 36    Date for OT Re-Evaluation 05/30/22    OT Start Time 205   OT Stop Time 310   OT Time Calculation (min) 65 min    Activity Tolerance Patient tolerated treatment well;No increased pain    Behavior During Therapy [facility] for tasks assessed/performed            03/14/2022    1513  OT Visits / Re-Eval   Visit Number 1  Number of Visits 36  Date for OT Re-Evaluation 05/30/2022  Authorization   Authorization Type --  Authorization Time Period --  Authorization - Visit Number --  Authorization - Number of Visits --  Progress Note Due on Visit --  OT Time Calculation   OT Start Time 0305  OT Stop Time 0415  OT Time Calculation (min) 70 min  End of Session   Equipment Utilized During Treatment --  Activity Tolerance Patient tolerated treatment well; No increased pain  Behavior During Therapy WFL for tasks assessed/performed       Past Medical History:  Diagnosis Date   Anemia    Anxiety    Arthritis    Asthma    hx as child - no prob as adult - no inhaler   Blood transfusion 01/22/11   transfusion 2 units at [facility]   Depression 08/2010   psych assessment   Dyspnea    Fibroid    Headache(784.0)    rx for imitrex - last one jan   Keloid    Lymphedema    Pulmonary embolism ([place])    Past Surgical History:  Procedure Laterality Date   ABDOMINAL HYSTERECTOMY     COLONOSCOPY N/A 11/08/2017   Procedure: COLONOSCOPY;  Surgeon: Danie Binder, MD;  [facility]: [facility];  Service: Endoscopy;  Laterality: N/A;  2:00pm   FLEXIBLE SIGMOIDOSCOPY N/A 09/17/2017   Procedure: FLEXIBLE SIGMOIDOSCOPY;  Surgeon: Danie Binder, MD;  Location: [facility];  Service: Endoscopy;  Laterality: N/A;    HERNIA REPAIR  0254   umbilical   POLYPECTOMY  11/08/2017   Procedure: POLYPECTOMY;  Surgeon: Danie Binder, MD;  Location: [facility];  Service: Endoscopy;;  ascending colon (CSx1), transverse colon (CS x1), splenic flexure (HSx1)   SVD     Spontaneous vaginal delivery; x 1   Patient Active Problem List   Diagnosis Date Noted   Rheumatoid arthritis ([place]) 03/06/2022   Lymphedema 03/06/2022   Morbid obesity ([place]) 09/14/2021   Chest pain 04/07/2021   Screening for malignant neoplasm of colon 03/09/2021   Constipation 03/09/2021   Iron deficiency anemia 03/09/2021   Body mass index (BMI) 45.0-49.9, adult ([place]) 08/20/2020   Chronic fatigue syndrome 08/20/2020   Polyarthralgia 08/20/2020   Vitamin D deficiency 08/20/2020   Cellulitis 12/31/2018   Elephantiasis 12/31/2018   Ischemic chest pain ([place]) 12/31/2018   History of pulmonary embolus (PE) 12/31/2018   Physical deconditioning 08/27/2018   Adenopathy 09/15/2017   Family history of colon cancer 08/01/2017   Taking multiple medications for chronic disease 08/01/2017   Axillary lymphadenopathy 01/26/2017   Urinary incontinence 01/25/2017   Hypokalemia 01/25/2017   Lobar pneumonia ([place]) 01/25/2017   Pressure ulcer of sacral region, stage 2 ([place]) 01/25/2017  Abnormal ECG 01/29/2013   Dyspnea    Menorrhagia with regular cycle 02/22/2011   Anemia 02/22/2011    PCP: Archie Patten, NP  REFERRING PROVIDER: Acquanetta Sit, DPM  REFERRING DIAG: I89.0  THERAPY DIAG:  Lymphedema, not elsewhere classified  ONSET DATE: 2018  SUBJECTIVE:                                                                                                                                                                                           SUBJECTIVE STATEMENT: [name] presents to OT for lymphedema care. Pt is accompanied by her adult daughter. Pt tells me she tolerated wraps applied at initial Rx session without increased pain.Pt  does not rate LE-related pain, but states it is unchanged.  PERTINENT HISTORY:  Severe, stage IV, BLE Lymphedema (Elephantiasis [place]). ENV is a complication of sever, chronic, progressive Lymphedema.  HTN, blood clot disorder with PE x 2 in 2018, RA, Hx recurrent cellulitis, severe morbid obesity (body mass index > 40.4 kg/m2).RA, deconditioning, hx pressure ulcer sacrum, hx non-pressure leg wound, hx depression hx chronic fatigue, hx keloids PAIN:  Are you having pain? Yes: NPRS scale: not rated/10 Pain location: BLE, L>R Pain description: throbbing, pulsating, aching, heavy, full, tight Aggravating factors: sleeping in chair with feet down, walking, standing Relieving factors: exercise, elevation, antibacterial soap, antibiotic powder  PRECAUTIONS: Fall, recurrent infections, hx non-pressure leg wound; current fungal infection-legs   IMPAIRMENTS AND FUNCTIONAL LIMITATIONS: difficulty walking, chronic, progressive limb swelling with associated pain and disfigurement, impaired balance, generalized debility, decreased BLE AROM, decreased strength, impaired endurance  impaired basic and instrumental ADLs ( bathing, dressing, grooming (skin inspection, skin and nail care), impaired functional ambulation, transfers and mobility, impaired home management, shopping, unable to cook and prep food, unable to drive, dependent for wc propulsion, unable to work, impaired social participation, impaired body image  PRIOR LEVEL OF FUNCTION: Requires assistive devices and varying levels of assistance with basic and instrumental ADLs, Needs assistance with functional ambulation, all transfers and functional mobility. Unable to work. Housebound due to impaired mobility and relies on others for transportation  PATIENT GOALS: keep swelling from getting worse so I don't lose my leg   OBJECTIVE:   OBSERVATIONS /ASSESSMENTS:     FOTO (functional outcomes measure): Intake score 03/01/22  44/100%  Lymphedema Life Impact Scale (LLIS) 61.76% (How much lymphedema related problems interferred with your life last week)  BLE COMPARATIVE LIMB VOLUMETRICS:   LANDMARK RIGHT   (TBA)  R LEG (A-D) N/A  R THIGH (E-G) ml  R FULL LIMB (A-G) ml  Limb Volume differential (LVD)  %  Volume change since  initial %  Volume change overall V  (Blank rows = not tested)  LANDMARK LEFT  03/14/22  R LEG (A-D) 14499.2 ml  R THIGH (E-G) 17195.4 ml  R FULL LIMB (A-G) 31694.6 mlml  Limb Volume differential (LVD)  %  Volume change since initial %  Volume change overall %  (Blank rows = not tested)     TODAY'S TREATMENT:                                                                                                                                        Occupational Therapy Evaluation  Pt and family edu LLIS assessment LLE multilayer compression wrapping   PATIENT EDUCATION:  Education details: intro level edu for compression wrapping using gradient techniques. Good return with verbal and tactile cues. Person educated: Patient and Andrew Education method: Explanation, Demonstration, Verbal and Tactile cues, and Handouts Education comprehension: verbalized understanding and returned demonstration  HOME EXERCISE PROGRAM: Daily , multilayer compression bandages to one limb at a time only during Intensive Phase CDT. Daily day and HOS  compression garments during Self-management Phase. Daily skin care w low ph lotion Daily lymphatic pumping there ex, 1 set of 10 each element, in order, bilaterally Daily simple self-MLD  ASSESSMENT:  CLINICAL IMPRESSION: Upon removing compression wraps applied at last visit a dramatic reduction in limb volume is observed. Greatest calf is reduced in volume by >5 cm today. Visible and palpable change is motivationg for patient. Emphasis of session on teaching Pt and family member to correctly apply comrpession wraps to LLE using gradient techniques. By  end of session Family member required Max A to apply. Cont nect session. Productive visit.  OBJECTIVE IMPAIRMENTS: Abnormal gait, decreased activity tolerance, decreased balance, decreased endurance, decreased knowledge of condition, decreased knowledge of use of DME, decreased mobility, difficulty walking, decreased ROM, decreased strength, dizziness, hypomobility, increased edema, increased fascial restrictions, impaired flexibility, impaired sensation, postural dysfunction, obesity, pain, and chronic, progressive swelling .   ACTIVITY LIMITATIONS: carrying, lifting, bending, standing, squatting, stairs, transfers, bed mobility, continence, bathing, toileting, dressing, hygiene/grooming, and caring for others  PARTICIPATION LIMITATIONS: meal prep, cleaning, laundry, driving, shopping, community activity, occupation, yard work, school, church, and caring for others  PERSONAL FACTORS: 3+ contributing Co morbidities, limited transportation, consistent, ongoing availability of caregivers, financial limitations are also affecting patient's functional outcome.   REHAB POTENTIAL: Lymphedema management has a high burden of care, especially for Patients who have difficulty, or who are unable to reach their distal legs and feet to apply bandages, compression garments, to bathe, inspect skin and groom nails. In this case because Pt is unable to perform these activities, daily caregiver assistance with all lower extremity lymphedema self-care home program components is essential for achieving clinical success and ongoing self-management. Without consistent, ongoing, daily caregiver assistance for compression bandaging and garments, skin care, and MLD Pt's prognosis is poor. With consistent caregiver assistance  with lymphedema home program Pt's prognosis for improved lymphedema control and reducing infection risk is guarded, at best, due to the advanced stage, multiple contributing co-morbidities, and sedentary  lifestyle.  EVALUATION COMPLEXITY: High   GOALS: Goals reviewed with patient? YES  SHORT TERM GOALS: Target date: 6th OT visit   Pt will demonstrate understanding of lymphedema precautions and prevention strategies with modified independence using a printed reference to identify at least 5 precautions and discussing how s/he may implement them into daily life to reduce risk of progression with modified assistance. Baseline: Max A Goal status: INITIAL  2.   With Max caregiver assistance Pt will be able to apply multilayer, knee length, compression wraps using gradient techniques to decrease limb volume, to limit infection risk, and to limit lymphedema progression.  Baseline: Dependent Goal status: INITIAL  LONG TERM GOALS: Target date: 05/30/22  Given this patient's Intake score of 44/100% on the functional outcomes FOTO tool, patient will experience an increase in function of 3  points to improve basic and instrumental ADLs performance, including lymphedema self-care. Baseline: 44/100 Goal status: INITIAL  2.  Given this patient's Intake score of TBA % on the Lymphedema Life Impact Scale (LLIS), patient will experience a reduction of at least 5% in her perceived level of functional impairment resulting from lymphedema to improve functional performance and quality of life (QOL). Baseline: TBA Goal status: INITIAL  3.  With max caregiver assistance Pt will be able to don and doff appropriate compression garments and/or devices to control BLE lymphedema and to limit progression.  Baseline: Dependent Goal status: INITIAL  4.  With tolerance building strategies over time  Pt will be able to tolerate compression bandages and garments for at least 24 hours  to control BLE lymphedema and to limit progression.  Baseline: Max A Goal status: INITIAL  5.  With Max caregiver assistance Pt will achieve and sustain no less than 85% compliance with all LE self-care home program components  throughout CDT, including modified simple self-MLD, daily skin care and inspection, lymphatic pumping the ex and appropriate compression to limit lymphedema progression and to limit further functional decline. Baseline: Dependent Goal status: INITIAL  6.  Pt will diligently perform skin care regime prescribed by her doctor to eliminate fungal infection and prevent reoccurrence for 90 days Baseline: Dependent Goal status: INITIAL   OT PLAN OF CARE:  PT FREQUENCY: 2x/week  PT DURATION: other: 12 weeks and PRN  PLANNED INTERVENTIONS: Therapeutic exercises, Therapeutic activity, Patient/Family education, Self Care, DME instructions, Manual lymph drainage, Compression bandaging, Vasopneumatic device, Manual therapy, and skin care  PLAN FOR NEXT SESSION:  Complete initial BLE comparative limb volumetrics If time allows wrap LLE below the knee with short stretch compression bandaging and commence Pt and family edu for compression wrapping  Andrey Spearman, MS, OTR/L, CLT-LANA 03/17/22 12:51 PM

## 2022-03-17 ENCOUNTER — Encounter: Payer: Self-pay | Admitting: Occupational Therapy

## 2022-03-22 ENCOUNTER — Ambulatory Visit: Payer: Medicare HMO | Admitting: Occupational Therapy

## 2022-03-22 DIAGNOSIS — I890 Lymphedema, not elsewhere classified: Secondary | ICD-10-CM

## 2022-03-23 NOTE — Therapy (Deleted)
OUTPATIENT OCCUPATIONAL THERAPY TREATMENT NOTE BILATERAL  LOWER EXTREMITY LYMPHEDEMA  Patient Name: [name] MRN: [id number] DOB:January 03, 1966, 57 y.o., female Today's Date: 03/23/2022  END OF SESSION:  OT End of Session - 03/15/22 1257     Visit Number 3   Number of Visits 36    Date for OT Re-Evaluation 05/30/22    OT Start Time 205   OT Stop Time 310   OT Time Calculation (min) 65 min    Activity Tolerance Patient tolerated treatment well;No increased pain    Behavior During Therapy [facility] for tasks assessed/performed            03/14/2022    1513  OT Visits / Re-Eval   Visit Number 1  Number of Visits 36  Date for OT Re-Evaluation 05/30/2022  Authorization   Authorization Type --  Authorization Time Period --  Authorization - Visit Number --  Authorization - Number of Visits --  Progress Note Due on Visit --  OT Time Calculation   OT Start Time 0305  OT Stop Time 0415  OT Time Calculation (min) 70 min  End of Session   Equipment Utilized During Treatment --  Activity Tolerance Patient tolerated treatment well; No increased pain  Behavior During Therapy WFL for tasks assessed/performed       Past Medical History:  Diagnosis Date   Anemia    Anxiety    Arthritis    Asthma    hx as child - no prob as adult - no inhaler   Blood transfusion 01/22/11   transfusion 2 units at [facility]   Depression 08/2010   psych assessment   Dyspnea    Fibroid    Headache(784.0)    rx for imitrex - last one jan   Keloid    Lymphedema    Pulmonary embolism ([place])    Past Surgical History:  Procedure Laterality Date   ABDOMINAL HYSTERECTOMY     COLONOSCOPY N/A 11/08/2017   Procedure: COLONOSCOPY;  Surgeon: Danie Binder, MD;  Location: [facility];  Service: Endoscopy;  Laterality: N/A;  2:00pm   FLEXIBLE SIGMOIDOSCOPY N/A 09/17/2017   Procedure: FLEXIBLE SIGMOIDOSCOPY;  Surgeon: Danie Binder, MD;  Location: [facility];  Service: Endoscopy;  Laterality: N/A;    HERNIA REPAIR  1062   umbilical   POLYPECTOMY  11/08/2017   Procedure: POLYPECTOMY;  Surgeon: Danie Binder, MD;  Location: [facility];  Service: Endoscopy;;  ascending colon (CSx1), transverse colon (CS x1), splenic flexure (HSx1)   SVD     Spontaneous vaginal delivery; x 1   Patient Active Problem List   Diagnosis Date Noted   Rheumatoid arthritis ([place]) 03/06/2022   Lymphedema 03/06/2022   Morbid obesity ([place]) 09/14/2021   Chest pain 04/07/2021   Screening for malignant neoplasm of colon 03/09/2021   Constipation 03/09/2021   Iron deficiency anemia 03/09/2021   Body mass index (BMI) 45.0-49.9, adult ([place]) 08/20/2020   Chronic fatigue syndrome 08/20/2020   Polyarthralgia 08/20/2020   Vitamin D deficiency 08/20/2020   Cellulitis 12/31/2018   Elephantiasis 12/31/2018   Ischemic chest pain ([place]) 12/31/2018   History of pulmonary embolus (PE) 12/31/2018   Physical deconditioning 08/27/2018   Adenopathy 09/15/2017   Family history of colon cancer 08/01/2017   Taking multiple medications for chronic disease 08/01/2017   Axillary lymphadenopathy 01/26/2017   Urinary incontinence 01/25/2017   Hypokalemia 01/25/2017   Lobar pneumonia ([place]) 01/25/2017   Pressure ulcer of sacral region, stage 2 ([place]) 01/25/2017  Abnormal ECG 01/29/2013   Dyspnea    Menorrhagia with regular cycle 02/22/2011   Anemia 02/22/2011    PCP: Archie Patten, NP  REFERRING PROVIDER: Acquanetta Sit, DPM  REFERRING DIAG: I89.0  THERAPY DIAG:  Lymphedema, not elsewhere classified  ONSET DATE: 2018  SUBJECTIVE:                                                                                                                                                                                           SUBJECTIVE STATEMENT: [name] presents to OT for lymphedema care. Pt is accompanied by her adult daughter. Pt tells me she tolerated wraps applied at initial Rx session without increased pain.Pt  does not rate LE-related pain, but states it is unchanged.  PERTINENT HISTORY:  Severe, stage IV, BLE Lymphedema (Elephantiasis [place]). ENV is a complication of sever, chronic, progressive Lymphedema.  HTN, blood clot disorder with PE x 2 in 2018, RA, Hx recurrent cellulitis, severe morbid obesity (body mass index > 40.4 kg/m2).RA, deconditioning, hx pressure ulcer sacrum, hx non-pressure leg wound, hx depression hx chronic fatigue, hx keloids PAIN:  Are you having pain? Yes: NPRS scale: not rated/10 Pain location: BLE, L>R Pain description: throbbing, pulsating, aching, heavy, full, tight Aggravating factors: sleeping in chair with feet down, walking, standing Relieving factors: exercise, elevation, antibacterial soap, antibiotic powder  PRECAUTIONS: Fall, recurrent infections, hx non-pressure leg wound; current fungal infection-legs   IMPAIRMENTS AND FUNCTIONAL LIMITATIONS: difficulty walking, chronic, progressive limb swelling with associated pain and disfigurement, impaired balance, generalized debility, decreased BLE AROM, decreased strength, impaired endurance  impaired basic and instrumental ADLs ( bathing, dressing, grooming (skin inspection, skin and nail care), impaired functional ambulation, transfers and mobility, impaired home management, shopping, unable to cook and prep food, unable to drive, dependent for wc propulsion, unable to work, impaired social participation, impaired body image  PRIOR LEVEL OF FUNCTION: Requires assistive devices and varying levels of assistance with basic and instrumental ADLs, Needs assistance with functional ambulation, all transfers and functional mobility. Unable to work. Housebound due to impaired mobility and relies on others for transportation  PATIENT GOALS: keep swelling from getting worse so I don't lose my leg   OBJECTIVE:   OBSERVATIONS /ASSESSMENTS:     FOTO (functional outcomes measure): Intake score 03/01/22  44/100%  Lymphedema Life Impact Scale (LLIS) 61.76% (How much lymphedema related problems interferred with your life last week)  BLE COMPARATIVE LIMB VOLUMETRICS:   LANDMARK RIGHT   (TBA)  R LEG (A-D) N/A  R THIGH (E-G) ml  R FULL LIMB (A-G) ml  Limb Volume differential (LVD)  %  Volume change since  initial %  Volume change overall V  (Blank rows = not tested)  LANDMARK LEFT  03/14/22  R LEG (A-D) 14499.2 ml  R THIGH (E-G) 17195.4 ml  R FULL LIMB (A-G) 31694.6 mlml  Limb Volume differential (LVD)  %  Volume change since initial %  Volume change overall %  (Blank rows = not tested)     TODAY'S TREATMENT:                                                                                                                                        Occupational Therapy Evaluation  Pt and family edu LLIS assessment LLE multilayer compression wrapping   PATIENT EDUCATION:  Education details: intro level edu for compression wrapping using gradient techniques. Good return with verbal and tactile cues. Person educated: Patient and [place] Education method: Explanation, Demonstration, Verbal and Tactile cues, and Handouts Education comprehension: verbalized understanding and returned demonstration  HOME EXERCISE PROGRAM: Daily , multilayer compression bandages to one limb at a time only during Intensive Phase CDT. Daily day and HOS  compression garments during Self-management Phase. Daily skin care w low ph lotion Daily lymphatic pumping there ex, 1 set of 10 each element, in order, bilaterally Daily simple self-MLD  ASSESSMENT:  CLINICAL IMPRESSION: Upon removing compression wraps applied at last visit a dramatic reduction in limb volume is observed. Greatest calf is reduced in volume by >5 cm today. Visible and palpable change is motivationg for patient. Emphasis of session on teaching Pt and family member to correctly apply comrpession wraps to LLE using gradient techniques. By  end of session Family member required Max A to apply. Cont nect session. Productive visit.  OBJECTIVE IMPAIRMENTS: Abnormal gait, decreased activity tolerance, decreased balance, decreased endurance, decreased knowledge of condition, decreased knowledge of use of DME, decreased mobility, difficulty walking, decreased ROM, decreased strength, dizziness, hypomobility, increased edema, increased fascial restrictions, impaired flexibility, impaired sensation, postural dysfunction, obesity, pain, and chronic, progressive swelling .   ACTIVITY LIMITATIONS: carrying, lifting, bending, standing, squatting, stairs, transfers, bed mobility, continence, bathing, toileting, dressing, hygiene/grooming, and caring for others  PARTICIPATION LIMITATIONS: meal prep, cleaning, laundry, driving, shopping, community activity, occupation, yard work, school, church, and caring for others  PERSONAL FACTORS: 3+ contributing Co morbidities, limited transportation, consistent, ongoing availability of caregivers, financial limitations are also affecting patient's functional outcome.   REHAB POTENTIAL: Lymphedema management has a high burden of care, especially for Patients who have difficulty, or who are unable to reach their distal legs and feet to apply bandages, compression garments, to bathe, inspect skin and groom nails. In this case because Pt is unable to perform these activities, daily caregiver assistance with all lower extremity lymphedema self-care home program components is essential for achieving clinical success and ongoing self-management. Without consistent, ongoing, daily caregiver assistance for compression bandaging and garments, skin care, and MLD Pt's prognosis is poor. With consistent caregiver assistance  with lymphedema home program Pt's prognosis for improved lymphedema control and reducing infection risk is guarded, at best, due to the advanced stage, multiple contributing co-morbidities, and sedentary  lifestyle.  EVALUATION COMPLEXITY: High   GOALS: Goals reviewed with patient? YES  SHORT TERM GOALS: Target date: 6th OT visit   Pt will demonstrate understanding of lymphedema precautions and prevention strategies with modified independence using a printed reference to identify at least 5 precautions and discussing how s/he may implement them into daily life to reduce risk of progression with modified assistance. Baseline: Max A Goal status: INITIAL  2.   With Max caregiver assistance Pt will be able to apply multilayer, knee length, compression wraps using gradient techniques to decrease limb volume, to limit infection risk, and to limit lymphedema progression.  Baseline: Dependent Goal status: INITIAL  LONG TERM GOALS: Target date: 05/30/22  Given this patient's Intake score of 44/100% on the functional outcomes FOTO tool, patient will experience an increase in function of 3  points to improve basic and instrumental ADLs performance, including lymphedema self-care. Baseline: 44/100 Goal status: INITIAL  2.  Given this patient's Intake score of TBA % on the Lymphedema Life Impact Scale (LLIS), patient will experience a reduction of at least 5% in her perceived level of functional impairment resulting from lymphedema to improve functional performance and quality of life (QOL). Baseline: TBA Goal status: INITIAL  3.  With max caregiver assistance Pt will be able to don and doff appropriate compression garments and/or devices to control BLE lymphedema and to limit progression.  Baseline: Dependent Goal status: INITIAL  4.  With tolerance building strategies over time  Pt will be able to tolerate compression bandages and garments for at least 24 hours  to control BLE lymphedema and to limit progression.  Baseline: Max A Goal status: INITIAL  5.  With Max caregiver assistance Pt will achieve and sustain no less than 85% compliance with all LE self-care home program components  throughout CDT, including modified simple self-MLD, daily skin care and inspection, lymphatic pumping the ex and appropriate compression to limit lymphedema progression and to limit further functional decline. Baseline: Dependent Goal status: INITIAL  6.  Pt will diligently perform skin care regime prescribed by her doctor to eliminate fungal infection and prevent reoccurrence for 90 days Baseline: Dependent Goal status: INITIAL   OT PLAN OF CARE:  PT FREQUENCY: 2x/week  PT DURATION: other: 12 weeks and PRN  PLANNED INTERVENTIONS: Therapeutic exercises, Therapeutic activity, Patient/Family education, Self Care, DME instructions, Manual lymph drainage, Compression bandaging, Vasopneumatic device, Manual therapy, and skin care  PLAN FOR NEXT SESSION:  Complete initial BLE comparative limb volumetrics If time allows wrap LLE below the knee with short stretch compression bandaging and commence Pt and family edu for compression wrapping  Andrey Spearman, MS, OTR/L, CLT-LANA 03/23/22 12:42 PM

## 2022-03-23 NOTE — Therapy (Deleted)
OUTPATIENT OCCUPATIONAL THERAPY TREATMENT NOTE BILATERAL  LOWER EXTREMITY LYMPHEDEMA  Patient Name: [name] MRN: [id number] DOB:Oct 03, 1965, 57 y.o., female Today's Date: 03/23/2022  END OF SESSION:  OT End of Session - 03/15/22 1257     Visit Number 3   Number of Visits 36    Date for OT Re-Evaluation 05/30/22    OT Start Time 205   OT Stop Time 310   OT Time Calculation (min) 65 min    Activity Tolerance Patient tolerated treatment well;No increased pain    Behavior During Therapy [facility] for tasks assessed/performed            03/14/2022    1513  OT Visits / Re-Eval   Visit Number 1  Number of Visits 36  Date for OT Re-Evaluation 05/30/2022  Authorization   Authorization Type --  Authorization Time Period --  Authorization - Visit Number --  Authorization - Number of Visits --  Progress Note Due on Visit --  OT Time Calculation   OT Start Time 0305  OT Stop Time 0415  OT Time Calculation (min) 70 min  End of Session   Equipment Utilized During Treatment --  Activity Tolerance Patient tolerated treatment well; No increased pain  Behavior During Therapy WFL for tasks assessed/performed       Past Medical History:  Diagnosis Date   Anemia    Anxiety    Arthritis    Asthma    hx as child - no prob as adult - no inhaler   Blood transfusion 01/22/11   transfusion 2 units at [facility]   Depression 08/2010   psych assessment   Dyspnea    Fibroid    Headache(784.0)    rx for imitrex - last one jan   Keloid    Lymphedema    Pulmonary embolism ([place])    Past Surgical History:  Procedure Laterality Date   ABDOMINAL HYSTERECTOMY     COLONOSCOPY N/A 11/08/2017   Procedure: COLONOSCOPY;  Surgeon: Danie Binder, MD;  [facility]: [facility];  Service: Endoscopy;  Laterality: N/A;  2:00pm   FLEXIBLE SIGMOIDOSCOPY N/A 09/17/2017   Procedure: FLEXIBLE SIGMOIDOSCOPY;  Surgeon: Danie Binder, MD;  Location: [facility];  Service: Endoscopy;  Laterality: N/A;    HERNIA REPAIR  2951   umbilical   POLYPECTOMY  11/08/2017   Procedure: POLYPECTOMY;  Surgeon: Danie Binder, MD;  Location: [facility];  Service: Endoscopy;;  ascending colon (CSx1), transverse colon (CS x1), splenic flexure (HSx1)   SVD     Spontaneous vaginal delivery; x 1   Patient Active Problem List   Diagnosis Date Noted   Rheumatoid arthritis ([place]) 03/06/2022   Lymphedema 03/06/2022   Morbid obesity ([place]) 09/14/2021   Chest pain 04/07/2021   Screening for malignant neoplasm of colon 03/09/2021   Constipation 03/09/2021   Iron deficiency anemia 03/09/2021   Body mass index (BMI) 45.0-49.9, adult ([place]) 08/20/2020   Chronic fatigue syndrome 08/20/2020   Polyarthralgia 08/20/2020   Vitamin D deficiency 08/20/2020   Cellulitis 12/31/2018   Elephantiasis 12/31/2018   Ischemic chest pain ([place]) 12/31/2018   History of pulmonary embolus (PE) 12/31/2018   Physical deconditioning 08/27/2018   Adenopathy 09/15/2017   Family history of colon cancer 08/01/2017   Taking multiple medications for chronic disease 08/01/2017   Axillary lymphadenopathy 01/26/2017   Urinary incontinence 01/25/2017   Hypokalemia 01/25/2017   Lobar pneumonia ([place]) 01/25/2017   Pressure ulcer of sacral region, stage 2 ([place]) 01/25/2017  Abnormal ECG 01/29/2013   Dyspnea    Menorrhagia with regular cycle 02/22/2011   Anemia 02/22/2011    PCP: Archie Patten, NP  REFERRING PROVIDER: Acquanetta Sit, DPM  REFERRING DIAG: I89.0  THERAPY DIAG:  Lymphedema, not elsewhere classified  ONSET DATE: 2018  SUBJECTIVE:                                                                                                                                                                                           SUBJECTIVE STATEMENT: [name] presents to OT for lymphedema care. Pt is accompanied by her adult daughter. Pt tells me she tolerated wraps applied at initial Rx session without increased pain.Pt  does not rate LE-related pain, but states it is unchanged.  PERTINENT HISTORY:  Severe, stage IV, BLE Lymphedema (Elephantiasis [place]). ENV is a complication of sever, chronic, progressive Lymphedema.  HTN, blood clot disorder with PE x 2 in 2018, RA, Hx recurrent cellulitis, severe morbid obesity (body mass index > 40.4 kg/m2).RA, deconditioning, hx pressure ulcer sacrum, hx non-pressure leg wound, hx depression hx chronic fatigue, hx keloids PAIN:  Are you having pain? Yes: NPRS scale: not rated/10 Pain location: BLE, L>R Pain description: throbbing, pulsating, aching, heavy, full, tight Aggravating factors: sleeping in chair with feet down, walking, standing Relieving factors: exercise, elevation, antibacterial soap, antibiotic powder  PRECAUTIONS: Fall, recurrent infections, hx non-pressure leg wound; current fungal infection-legs   IMPAIRMENTS AND FUNCTIONAL LIMITATIONS: difficulty walking, chronic, progressive limb swelling with associated pain and disfigurement, impaired balance, generalized debility, decreased BLE AROM, decreased strength, impaired endurance  impaired basic and instrumental ADLs ( bathing, dressing, grooming (skin inspection, skin and nail care), impaired functional ambulation, transfers and mobility, impaired home management, shopping, unable to cook and prep food, unable to drive, dependent for wc propulsion, unable to work, impaired social participation, impaired body image  PRIOR LEVEL OF FUNCTION: Requires assistive devices and varying levels of assistance with basic and instrumental ADLs, Needs assistance with functional ambulation, all transfers and functional mobility. Unable to work. Housebound due to impaired mobility and relies on others for transportation  PATIENT GOALS: keep swelling from getting worse so I don't lose my leg   OBJECTIVE:   OBSERVATIONS /ASSESSMENTS:     FOTO (functional outcomes measure): Intake score 03/01/22  44/100%  Lymphedema Life Impact Scale (LLIS) 61.76% (How much lymphedema related problems interferred with your life last week)  BLE COMPARATIVE LIMB VOLUMETRICS:   LANDMARK RIGHT   (TBA)  R LEG (A-D) N/A  R THIGH (E-G) ml  R FULL LIMB (A-G) ml  Limb Volume differential (LVD)  %  Volume change since  initial %  Volume change overall V  (Blank rows = not tested)  LANDMARK LEFT  03/14/22  R LEG (A-D) 14499.2 ml  R THIGH (E-G) 17195.4 ml  R FULL LIMB (A-G) 31694.6 mlml  Limb Volume differential (LVD)  %  Volume change since initial %  Volume change overall %  (Blank rows = not tested)     TODAY'S TREATMENT:                                                                                                                                        Occupational Therapy Evaluation  Pt and family edu LLIS assessment LLE multilayer compression wrapping   PATIENT EDUCATION:  Education details: intro level edu for compression wrapping using gradient techniques. Good return with verbal and tactile cues. Person educated: Patient and [place] Education method: Explanation, Demonstration, Verbal and Tactile cues, and Handouts Education comprehension: verbalized understanding and returned demonstration  HOME EXERCISE PROGRAM: Daily , multilayer compression bandages to one limb at a time only during Intensive Phase CDT. Daily day and HOS  compression garments during Self-management Phase. Daily skin care w low ph lotion Daily lymphatic pumping there ex, 1 set of 10 each element, in order, bilaterally Daily simple self-MLD  ASSESSMENT:  CLINICAL IMPRESSION: Upon removing compression wraps applied at last visit a dramatic reduction in limb volume is observed. Greatest calf is reduced in volume by >5 cm today. Visible and palpable change is motivationg for patient. Emphasis of session on teaching Pt and family member to correctly apply comrpession wraps to LLE using gradient techniques. By  end of session Family member required Max A to apply. Cont nect session. Productive visit.  OBJECTIVE IMPAIRMENTS: Abnormal gait, decreased activity tolerance, decreased balance, decreased endurance, decreased knowledge of condition, decreased knowledge of use of DME, decreased mobility, difficulty walking, decreased ROM, decreased strength, dizziness, hypomobility, increased edema, increased fascial restrictions, impaired flexibility, impaired sensation, postural dysfunction, obesity, pain, and chronic, progressive swelling .   ACTIVITY LIMITATIONS: carrying, lifting, bending, standing, squatting, stairs, transfers, bed mobility, continence, bathing, toileting, dressing, hygiene/grooming, and caring for others  PARTICIPATION LIMITATIONS: meal prep, cleaning, laundry, driving, shopping, community activity, occupation, yard work, school, church, and caring for others  PERSONAL FACTORS: 3+ contributing Co morbidities, limited transportation, consistent, ongoing availability of caregivers, financial limitations are also affecting patient's functional outcome.   REHAB POTENTIAL: Lymphedema management has a high burden of care, especially for Patients who have difficulty, or who are unable to reach their distal legs and feet to apply bandages, compression garments, to bathe, inspect skin and groom nails. In this case because Pt is unable to perform these activities, daily caregiver assistance with all lower extremity lymphedema self-care home program components is essential for achieving clinical success and ongoing self-management. Without consistent, ongoing, daily caregiver assistance for compression bandaging and garments, skin care, and MLD Pt's prognosis is poor. With consistent caregiver assistance  with lymphedema home program Pt's prognosis for improved lymphedema control and reducing infection risk is guarded, at best, due to the advanced stage, multiple contributing co-morbidities, and sedentary  lifestyle.  EVALUATION COMPLEXITY: High   GOALS: Goals reviewed with patient? YES  SHORT TERM GOALS: Target date: 6th OT visit   Pt will demonstrate understanding of lymphedema precautions and prevention strategies with modified independence using a printed reference to identify at least 5 precautions and discussing how s/he may implement them into daily life to reduce risk of progression with modified assistance. Baseline: Max A Goal status: INITIAL  2.   With Max caregiver assistance Pt will be able to apply multilayer, knee length, compression wraps using gradient techniques to decrease limb volume, to limit infection risk, and to limit lymphedema progression.  Baseline: Dependent Goal status: INITIAL  LONG TERM GOALS: Target date: 05/30/22  Given this patient's Intake score of 44/100% on the functional outcomes FOTO tool, patient will experience an increase in function of 3  points to improve basic and instrumental ADLs performance, including lymphedema self-care. Baseline: 44/100 Goal status: INITIAL  2.  Given this patient's Intake score of TBA % on the Lymphedema Life Impact Scale (LLIS), patient will experience a reduction of at least 5% in her perceived level of functional impairment resulting from lymphedema to improve functional performance and quality of life (QOL). Baseline: TBA Goal status: INITIAL  3.  With max caregiver assistance Pt will be able to don and doff appropriate compression garments and/or devices to control BLE lymphedema and to limit progression.  Baseline: Dependent Goal status: INITIAL  4.  With tolerance building strategies over time  Pt will be able to tolerate compression bandages and garments for at least 24 hours  to control BLE lymphedema and to limit progression.  Baseline: Max A Goal status: INITIAL  5.  With Max caregiver assistance Pt will achieve and sustain no less than 85% compliance with all LE self-care home program components  throughout CDT, including modified simple self-MLD, daily skin care and inspection, lymphatic pumping the ex and appropriate compression to limit lymphedema progression and to limit further functional decline. Baseline: Dependent Goal status: INITIAL  6.  Pt will diligently perform skin care regime prescribed by her doctor to eliminate fungal infection and prevent reoccurrence for 90 days Baseline: Dependent Goal status: INITIAL   OT PLAN OF CARE:  PT FREQUENCY: 2x/week  PT DURATION: other: 12 weeks and PRN  PLANNED INTERVENTIONS: Therapeutic exercises, Therapeutic activity, Patient/Family education, Self Care, DME instructions, Manual lymph drainage, Compression bandaging, Vasopneumatic device, Manual therapy, and skin care  PLAN FOR NEXT SESSION:  Complete initial BLE comparative limb volumetrics If time allows wrap LLE below the knee with short stretch compression bandaging and commence Pt and family edu for compression wrapping  Andrey Spearman, MS, OTR/L, CLT-LANA 03/23/22 12:40 PM

## 2022-03-23 NOTE — Therapy (Signed)
OUTPATIENT OCCUPATIONAL THERAPY TREATMENT NOTE BILATERAL  LOWER EXTREMITY LYMPHEDEMA  Patient Name: [name] MRN: [id number] DOB:[birth year], 57 y.o., female Today's Date: 03/23/2022  END OF SESSION:  OT End of Session - 03/15/22 1257     Visit Number 4   Number of Visits 36    Date for OT Re-Evaluation 05/30/22    OT Start Time 206   OT Stop Time 306   OT Time Calculation (min) 60 min    Activity Tolerance Patient tolerated treatment well;No increased pain    Behavior During Therapy [name] for tasks assessed/performed                1513      Past Medical History:  Diagnosis Date   Anemia    Anxiety    Arthritis    Asthma    hx as child - no prob as adult - no inhaler   Blood transfusion 01/22/11   transfusion 2 units at [name]   Depression 08/2010   psych assessment   Dyspnea    Fibroid    Headache(784.0)    rx for imitrex - last one jan   Keloid    Lymphedema    Pulmonary embolism ([place])    Past Surgical History:  Procedure Laterality Date   ABDOMINAL HYSTERECTOMY     COLONOSCOPY N/A 11/08/2017   Procedure: COLONOSCOPY;  Surgeon: Danie Binder, MD;  Location: [facility];  Service: Endoscopy;  Laterality: N/A;  2:00pm   FLEXIBLE SIGMOIDOSCOPY N/A 09/17/2017   Procedure: FLEXIBLE SIGMOIDOSCOPY;  Surgeon: Danie Binder, MD;  Location: [facility];  Service: Endoscopy;  Laterality: N/A;   HERNIA REPAIR  1027   umbilical   POLYPECTOMY  11/08/2017   Procedure: POLYPECTOMY;  Surgeon: Danie Binder, MD;  Location: [facility];  Service: Endoscopy;;  ascending colon (CSx1), transverse colon (CS x1), splenic flexure (HSx1)   SVD     Spontaneous vaginal delivery; x 1   Patient Active Problem List   Diagnosis Date Noted   Rheumatoid arthritis ([place]) 03/06/2022   Lymphedema 03/06/2022   Morbid obesity ([place]) 09/14/2021   Chest pain 04/07/2021   Screening for malignant neoplasm of colon 03/09/2021   Constipation 03/09/2021   Iron deficiency anemia  03/09/2021   Body mass index (BMI) 45.0-49.9, adult ([place]) 08/20/2020   Chronic fatigue syndrome 08/20/2020   Polyarthralgia 08/20/2020   Vitamin D deficiency 08/20/2020   Cellulitis 12/31/2018   Elephantiasis 12/31/2018   Ischemic chest pain ([place]) 12/31/2018   History of pulmonary embolus (PE) 12/31/2018   Physical deconditioning 08/27/2018   Adenopathy 09/15/2017   Family history of colon cancer 08/01/2017   Taking multiple medications for chronic disease 08/01/2017   Axillary lymphadenopathy 01/26/2017   Urinary incontinence 01/25/2017   Hypokalemia 01/25/2017   Lobar pneumonia ([place]) 01/25/2017   Pressure ulcer of sacral region, stage 2 ([place]) 01/25/2017   Abnormal ECG 01/29/2013   Dyspnea    Menorrhagia with regular cycle 02/22/2011   Anemia 02/22/2011    PCP: Archie Patten, NP  REFERRING PROVIDER: Acquanetta Sit, DPM  REFERRING DIAG: I89.0  THERAPY DIAG:  Lymphedema, not elsewhere classified  ONSET DATE: 2018  SUBJECTIVE:  SUBJECTIVE STATEMENT: [name] presents to OT for lymphedema care. Pt is accompanied by her adult daughter. Pt tells me she tolerated wraps applied at initial Rx session without increased pain.Pt does not rate LE-related pain, but states it is unchanged. Pt reports she tolerated compression wraps without difficulty between sessions. She reports open area on back of her L calf continues to drain and smell bad.   PERTINENT HISTORY:  Severe, stage IV, BLE Lymphedema (Elephantiasis [place]). ENV is a complication of sever, chronic, progressive Lymphedema.  HTN, blood clot disorder with PE x 2 in 2018, RA, Hx recurrent cellulitis, severe morbid obesity (body mass index > 40.4 kg/m2).RA, deconditioning, hx pressure ulcer sacrum, hx non-pressure leg wound, hx  depression hx chronic fatigue, hx keloids PAIN:  Are you having pain? Yes: NPRS scale: not rated/10 Pain location: BLE, L>R Pain description: throbbing, pulsating, aching, heavy, full, tight Aggravating factors: sleeping in chair with feet down, walking, standing Relieving factors: exercise, elevation, antibacterial soap, antibiotic powder  PRECAUTIONS: Fall, recurrent infections, hx non-pressure leg wound; current fungal infection-legs   IMPAIRMENTS AND FUNCTIONAL LIMITATIONS: difficulty walking, chronic, progressive limb swelling with associated pain and disfigurement, impaired balance, generalized debility, decreased BLE AROM, decreased strength, impaired endurance  impaired basic and instrumental ADLs ( bathing, dressing, grooming (skin inspection, skin and nail care), impaired functional ambulation, transfers and mobility, impaired home management, shopping, unable to cook and prep food, unable to drive, dependent for wc propulsion, unable to work, impaired social participation, impaired body image  PRIOR LEVEL OF FUNCTION: Requires assistive devices and varying levels of assistance with basic and instrumental ADLs, Needs assistance with functional ambulation, all transfers and functional mobility. Unable to work. Housebound due to impaired mobility and relies on others for transportation  PATIENT GOALS: keep swelling from getting worse so I don't lose my leg   OBJECTIVE:   OBSERVATIONS /ASSESSMENTS:     FOTO (functional outcomes measure): Intake score 03/01/22 44/100%  Lymphedema Life Impact Scale (LLIS) 61.76% (How much lymphedema related problems interfered with your life last week)  BLE COMPARATIVE LIMB VOLUMETRICS:   LANDMARK RIGHT   (TBA)  R LEG (A-D) N/A  R THIGH (E-G) ml  R FULL LIMB (A-G) ml  Limb Volume differential (LVD)  %  Volume change since initial %  Volume change overall V  (Blank rows = not tested)  LANDMARK LEFT  03/14/22  R LEG (A-D) 14499.2 ml  R THIGH  (E-G) 17195.4 ml  R FULL LIMB (A-G) 31694.6 mlml  Limb Volume differential (LVD)  %  Volume change since initial %  Volume change overall %  (Blank rows = not tested)     TODAY'S TREATMENT:                                                                                                                                        Occupational Therapy Evaluation  Pt and  family edu LLIS assessment LLE multilayer compression wrapping   PATIENT EDUCATION:  Education details: intro level edu for compression wrapping using gradient techniques. Good return with verbal and tactile cues. Person educated: Patient and [place] Education method: Explanation, Demonstration, Verbal and Tactile cues, and Handouts Education comprehension: verbalized understanding and returned demonstration  HOME EXERCISE PROGRAM: Daily , multilayer compression bandages to one limb at a time only during Intensive Phase CDT. Daily day and HOS  compression garments during Self-management Phase. Daily skin care w low ph lotion Daily lymphatic pumping there ex, 1 set of 10 each element, in order, bilaterally Daily simple self-MLD  ASSESSMENT:  CLINICAL IMPRESSION: Upon removing compression wraps applied at last visit a dramatic reduction in limb volume is continues. Suspect open area on posterior leg involves a lymphatic fistula. Wound continues to be quite malodorous with bloody exudate despite continuous compression. Pt directed to call wound clinic for consult and for meds to address fungal infection, or Pseudomonas(?).  Emphasis of session again today on teaching Pt and family member to correctly apply compression wraps to LLE using gradient techniques. By end of session Family member required Mod A to apply. Cont as per POC.  OBJECTIVE IMPAIRMENTS: Abnormal gait, decreased activity tolerance, decreased balance, decreased endurance, decreased knowledge of condition, decreased knowledge of use of DME, decreased  mobility, difficulty walking, decreased ROM, decreased strength, dizziness, hypomobility, increased edema, increased fascial restrictions, impaired flexibility, impaired sensation, postural dysfunction, obesity, pain, and chronic, progressive swelling .   ACTIVITY LIMITATIONS: carrying, lifting, bending, standing, squatting, stairs, transfers, bed mobility, continence, bathing, toileting, dressing, hygiene/grooming, and caring for others  PARTICIPATION LIMITATIONS: meal prep, cleaning, laundry, driving, shopping, community activity, occupation, yard work, school, church, and caring for others  PERSONAL FACTORS: 3+ contributing Co morbidities, limited transportation, consistent, ongoing availability of caregivers, financial limitations are also affecting patient's functional outcome.   REHAB POTENTIAL: Lymphedema management has a high burden of care, especially for Patients who have difficulty, or who are unable to reach their distal legs and feet to apply bandages, compression garments, to bathe, inspect skin and groom nails. In this case because Pt is unable to perform these activities, daily caregiver assistance with all lower extremity lymphedema self-care home program components is essential for achieving clinical success and ongoing self-management. Without consistent, ongoing, daily caregiver assistance for compression bandaging and garments, skin care, and MLD Pt's prognosis is poor. With consistent caregiver assistance with lymphedema home program Pt's prognosis for improved lymphedema control and reducing infection risk is guarded, at best, due to the advanced stage, multiple contributing co-morbidities, and sedentary lifestyle.  EVALUATION COMPLEXITY: High   GOALS: Goals reviewed with patient? YES  SHORT TERM GOALS: Target date: 6th OT visit   Pt will demonstrate understanding of lymphedema precautions and prevention strategies with modified independence using a printed reference to  identify at least 5 precautions and discussing how s/he may implement them into daily life to reduce risk of progression with modified assistance. Baseline: Max A Goal status: INITIAL  2.   With Max caregiver assistance Pt will be able to apply multilayer, knee length, compression wraps using gradient techniques to decrease limb volume, to limit infection risk, and to limit lymphedema progression.  Baseline: Dependent Goal status: INITIAL  LONG TERM GOALS: Target date: 05/30/22  Given this patient's Intake score of 44/100% on the functional outcomes FOTO tool, patient will experience an increase in function of 3  points to improve basic and instrumental ADLs performance, including lymphedema self-care. Baseline: 44/100  Goal status: INITIAL  2.  Given this patient's Intake score of TBA % on the Lymphedema Life Impact Scale (LLIS), patient will experience a reduction of at least 5% in her perceived level of functional impairment resulting from lymphedema to improve functional performance and quality of life (QOL). Baseline: TBA Goal status: INITIAL  3.  With max caregiver assistance Pt will be able to don and doff appropriate compression garments and/or devices to control BLE lymphedema and to limit progression.  Baseline: Dependent Goal status: INITIAL  4.  With tolerance building strategies over time  Pt will be able to tolerate compression bandages and garments for at least 24 hours  to control BLE lymphedema and to limit progression.  Baseline: Max A Goal status: INITIAL  5.  With Max caregiver assistance Pt will achieve and sustain no less than 85% compliance with all LE self-care home program components throughout CDT, including modified simple self-MLD, daily skin care and inspection, lymphatic pumping the ex and appropriate compression to limit lymphedema progression and to limit further functional decline. Baseline: Dependent Goal status: INITIAL  6.  Pt will diligently perform  skin care regime prescribed by her doctor to eliminate fungal infection and prevent reoccurrence for 90 days Baseline: Dependent Goal status: INITIAL   OT PLAN OF CARE:  PT FREQUENCY: 2x/week  PT DURATION: other: 12 weeks and PRN  PLANNED INTERVENTIONS: Therapeutic exercises, Therapeutic activity, Patient/Family education, Self Care, DME instructions, Manual lymph drainage, Compression bandaging, Vasopneumatic device, Manual therapy, and skin care  PLAN FOR NEXT SESSION:  Complete initial BLE comparative limb volumetrics If time allows wrap LLE below the knee with short stretch compression bandaging and commence Pt and family edu for compression wrapping Andrey Spearman, MS, OTR/L, CLT-LANA 03/23/22 12:54 PM

## 2022-03-28 ENCOUNTER — Encounter: Payer: Self-pay | Admitting: Gastroenterology

## 2022-03-29 ENCOUNTER — Ambulatory Visit: Payer: Medicare HMO | Admitting: Occupational Therapy

## 2022-04-04 ENCOUNTER — Encounter: Payer: Medicare HMO | Admitting: Gastroenterology

## 2022-04-05 ENCOUNTER — Ambulatory Visit: Payer: Medicare HMO | Attending: Podiatry | Admitting: Occupational Therapy

## 2022-04-05 DIAGNOSIS — I890 Lymphedema, not elsewhere classified: Secondary | ICD-10-CM | POA: Diagnosis present

## 2022-04-06 NOTE — Therapy (Signed)
OUTPATIENT OCCUPATIONAL THERAPY TREATMENT NOTE BILATERAL  LOWER EXTREMITY LYMPHEDEMA  Patient Name: [name] MRN: [id number] DOB:04/15/1965, 57 y.o., female Today's Date: 04/06/2022  END OF SESSION:  OT End of Session - 03/15/22 1257     Visit Number 4   Number of Visits 36    Date for OT Re-Evaluation 05/30/22    OT Start Time 206   OT Stop Time 306   OT Time Calculation (min) 60 min    Activity Tolerance Patient tolerated treatment well;No increased pain    Behavior During Therapy [facility] for tasks assessed/performed                1513      Past Medical History:  Diagnosis Date   Anemia    Anxiety    Arthritis    Asthma    hx as child - no prob as adult - no inhaler   Blood transfusion 01/22/11   transfusion 2 units at [facility]   Depression 08/2010   psych assessment   Dyspnea    Fibroid    Headache(784.0)    rx for imitrex - last one jan   Keloid    Lymphedema    Pulmonary embolism ([place])    Past Surgical History:  Procedure Laterality Date   ABDOMINAL HYSTERECTOMY     COLONOSCOPY N/A 11/08/2017   Procedure: COLONOSCOPY;  Surgeon: Danie Binder, MD;  Location: [facility];  Service: Endoscopy;  Laterality: N/A;  2:00pm   FLEXIBLE SIGMOIDOSCOPY N/A 09/17/2017   Procedure: FLEXIBLE SIGMOIDOSCOPY;  Surgeon: Danie Binder, MD;  Location: [facility];  Service: Endoscopy;  Laterality: N/A;   HERNIA REPAIR  2080   umbilical   POLYPECTOMY  11/08/2017   Procedure: POLYPECTOMY;  Surgeon: Danie Binder, MD;  Location: [facility];  Service: Endoscopy;;  ascending colon (CSx1), transverse colon (CS x1), splenic flexure (HSx1)   SVD     Spontaneous vaginal delivery; x 1   Patient Active Problem List   Diagnosis Date Noted   Rheumatoid arthritis ([place]) 03/06/2022   Lymphedema 03/06/2022   Morbid obesity ([place]) 09/14/2021   Chest pain 04/07/2021   Screening for malignant neoplasm of colon 03/09/2021   Constipation 03/09/2021   Iron deficiency anemia  03/09/2021   Body mass index (BMI) 45.0-49.9, adult ([place]) 08/20/2020   Chronic fatigue syndrome 08/20/2020   Polyarthralgia 08/20/2020   Vitamin D deficiency 08/20/2020   Cellulitis 12/31/2018   Elephantiasis 12/31/2018   Ischemic chest pain ([place]) 12/31/2018   History of pulmonary embolus (PE) 12/31/2018   Physical deconditioning 08/27/2018   Adenopathy 09/15/2017   Family history of colon cancer 08/01/2017   Taking multiple medications for chronic disease 08/01/2017   Axillary lymphadenopathy 01/26/2017   Urinary incontinence 01/25/2017   Hypokalemia 01/25/2017   Lobar pneumonia ([place]) 01/25/2017   Pressure ulcer of sacral region, stage 2 ([place]) 01/25/2017   Abnormal ECG 01/29/2013   Dyspnea    Menorrhagia with regular cycle 02/22/2011   Anemia 02/22/2011    PCP: Archie Patten, NP  REFERRING PROVIDER: Acquanetta Sit, DPM  REFERRING DIAG: I89.0  THERAPY DIAG:  Lymphedema, not elsewhere classified  ONSET DATE: 2018  SUBJECTIVE:  SUBJECTIVE STATEMENT: [name] presents to OT for lymphedema care. Pt is accompanied by her adult daughter. Pt has missed last 2 weeks of scheduled therapy due to transportation and her daughter's caregiver demands. Pt states she has wrapped her leg daily since last visit. She has not been to wound clinic to address area of malodorous skin breakdown on L calf. Pt states the bottom of her foot hurts where she is weightbearing on a large skin fold. Pt states she is unable to inspect  skin on her distal legs and feet. OT took cell phone photo so Pt could not;d see the leg wound and the sore spot on her foot.Photo was deleted permanently after Pt reviewed it.  PERTINENT HISTORY:  Severe, stage IV, BLE Lymphedema (Elephantiasis [place]). ENV is a complication  of sever, chronic, progressive Lymphedema.  HTN, blood clot disorder with PE x 2 in 2018, RA, Hx recurrent cellulitis, severe morbid obesity (body mass index > 40.4 kg/m2).RA, deconditioning, hx pressure ulcer sacrum, hx non-pressure leg wound, hx depression hx chronic fatigue, hx keloids PAIN:  Are you having pain? Yes: NPRS scale: not rated/10 Pain location: L foot and  leg Pain description: throbbing, pulsating, aching, heavy, full, tight Aggravating factors: sleeping in chair with feet down, walking, standing Relieving factors: exercise, elevation, antibacterial soap, antibiotic powder  PRECAUTIONS: Fall, recurrent infections, hx non-pressure leg wound; current fungal infection-legs   IMPAIRMENTS AND FUNCTIONAL LIMITATIONS: difficulty walking, chronic, progressive limb swelling with associated pain and disfigurement, impaired balance, generalized debility, decreased BLE AROM, decreased strength, impaired endurance  impaired basic and instrumental ADLs ( bathing, dressing, grooming (skin inspection, skin and nail care), impaired functional ambulation, transfers and mobility, impaired home management, shopping, unable to cook and prep food, unable to drive, dependent for wc propulsion, unable to work, impaired social participation, impaired body image  PRIOR LEVEL OF FUNCTION: Requires assistive devices and varying levels of assistance with basic and instrumental ADLs, Needs assistance with functional ambulation, all transfers and functional mobility. Unable to work. Housebound due to impaired mobility and relies on others for transportation  PATIENT GOALS: keep swelling from getting worse so I don't lose my leg   OBJECTIVE:   OBSERVATIONS /ASSESSMENTS:     FOTO (functional outcomes measure): Intake score 03/01/22 44/100%  Lymphedema Life Impact Scale (LLIS) 61.76% (How much lymphedema related problems interfered with your life last week)  BLE COMPARATIVE LIMB VOLUMETRICS:   LANDMARK  RIGHT   (TBA)  R LEG (A-D) N/A  R THIGH (E-G) ml  R FULL LIMB (A-G) ml  Limb Volume differential (LVD)  %  Volume change since initial %  Volume change overall V  (Blank rows = not tested)  LANDMARK LEFT  03/14/22  R LEG (A-D) 14499.2 ml  R THIGH (E-G) 17195.4 ml  R FULL LIMB (A-G) 31694.6 ml  Limb Volume differential (LVD)  %  Volume change since initial %  Volume change overall %  (Blank rows = not tested)     TODAY'S TREATMENT:  Pt and family edu Skin care LLE multilayer compression wrapping   PATIENT EDUCATION:  Education details: Continued Pt and caregiver edu for gradient compression bandaging. Added edu today re importance of skin care, including skin inspection and hydration Person educated: Patient and [place] Education method: Explanation, Demonstration, Verbal and Tactile cues, and Handouts Education comprehension: verbalized understanding and returned demonstration  HOME EXERCISE PROGRAM: Daily , multilayer compression bandages to one limb at a time only during Intensive Phase CDT. Daily day and HOS  compression garments during Self-management Phase. Daily skin care w low ph lotion Daily lymphatic pumping there ex, 1 set of 10 each element, in order, bilaterally Daily simple self-MLD  ASSESSMENT:  CLINICAL IMPRESSION: Leaking wound on L calf continues to be quite malodorous with intermittent bloody lymphorrhea even with continuous compression wrapping. Pt has been using cream prescribed in August   of 2023, but wound appears to have nearly doubled in size and area of maceration around it is increased. Pt directed again to call wound clinic for consult for wound care and infection control. Emphasis of session on Pt and family edu  for the importance of proper skin care to limit infection. Pt educated re assistive devices, aka  goose neck mirrors for skin inspection. Continued edu re multilayer compression wrapping. Fabricated a soft foam pad for bottom  of foot and area of skin of leg that is in contact with the floor when walking to protect it from injury. Cont as per POC.  OBJECTIVE IMPAIRMENTS: Abnormal gait, decreased activity tolerance, decreased balance, decreased endurance, decreased knowledge of condition, decreased knowledge of use of DME, decreased mobility, difficulty walking, decreased ROM, decreased strength, dizziness, hypomobility, increased edema, increased fascial restrictions, impaired flexibility, impaired sensation, postural dysfunction, obesity, pain, and chronic, progressive swelling .   ACTIVITY LIMITATIONS: carrying, lifting, bending, standing, squatting, stairs, transfers, bed mobility, continence, bathing, toileting, dressing, hygiene/grooming, and caring for others  PARTICIPATION LIMITATIONS: meal prep, cleaning, laundry, driving, shopping, community activity, occupation, yard work, school, church, and caring for others  PERSONAL FACTORS: 3+ contributing Co morbidities, limited transportation, consistent, ongoing availability of caregivers, financial limitations are also affecting patient's functional outcome.   REHAB POTENTIAL: Lymphedema management has a high burden of care, especially for Patients who have difficulty, or who are unable to reach their distal legs and feet to apply bandages, compression garments, to bathe, inspect skin and groom nails. In this case because Pt is unable to perform these activities, daily caregiver assistance with all lower extremity lymphedema self-care home program components is essential for achieving clinical success and ongoing self-management. Without consistent, ongoing, daily caregiver assistance for compression bandaging and garments, skin care, and MLD Pt's prognosis is poor. With consistent caregiver assistance with lymphedema home program Pt's prognosis for  improved lymphedema control and reducing infection risk is guarded, at best, due to the advanced stage, multiple contributing co-morbidities, and sedentary lifestyle.  EVALUATION COMPLEXITY: High   GOALS: Goals reviewed with patient? YES  SHORT TERM GOALS: Target date: 6th OT visit   Pt will demonstrate understanding of lymphedema precautions and prevention strategies with modified independence using a printed reference to identify at least 5 precautions and discussing how s/he may implement them into daily life to reduce risk of progression with modified assistance. Baseline: Max A Goal status: INITIAL  2.   With Max caregiver assistance Pt will be able to apply multilayer, knee length, compression wraps using gradient techniques to decrease limb volume, to limit infection risk, and to limit lymphedema progression.  Baseline: Dependent Goal status: INITIAL  LONG TERM GOALS: Target date: 05/30/22  Given this patient's Intake score of 44/100% on the functional outcomes FOTO tool, patient will experience an increase in function of 3  points to improve basic and instrumental ADLs performance, including lymphedema self-care. Baseline: 44/100 Goal status: INITIAL  2.  Given this patient's Intake score of TBA % on the Lymphedema Life Impact Scale (LLIS), patient will experience a reduction of at least 5% in her perceived level of functional impairment resulting from lymphedema to improve functional performance and quality of life (QOL). Baseline: TBA Goal status: INITIAL  3.  With max caregiver assistance Pt will be able to don and doff appropriate compression garments and/or devices to control BLE lymphedema and to limit progression.  Baseline: Dependent Goal status: INITIAL  4.  With tolerance building strategies over time  Pt will be able to tolerate compression bandages and garments for at least 24 hours  to control BLE lymphedema and to limit progression.  Baseline: Max A Goal status:  INITIAL  5.  With Max caregiver assistance Pt will achieve and sustain no less than 85% compliance with all LE self-care home program components throughout CDT, including modified simple self-MLD, daily skin care and inspection, lymphatic pumping the ex and appropriate compression to limit lymphedema progression and to limit further functional decline. Baseline: Dependent Goal status: INITIAL  6.  Pt will diligently perform skin care regime prescribed by her doctor to eliminate fungal infection and prevent reoccurrence for 90 days Baseline: Dependent Goal status: INITIAL   OT PLAN OF CARE:  PT FREQUENCY: 2x/week  PT DURATION: other: 12 weeks and PRN  PLANNED INTERVENTIONS: Therapeutic exercises, Therapeutic activity, Patient/Family education, Self Care, DME instructions, Manual lymph drainage, Compression bandaging, Vasopneumatic device, Manual therapy, and skin care  PLAN FOR NEXT SESSION:  Skin care MLD Multilayer wraps Pt/ family edu  Andrey Spearman, MS, OTR/L, CLT-LANA 04/06/22 12:09 PM

## 2022-04-12 ENCOUNTER — Ambulatory Visit: Payer: Medicare HMO | Admitting: Occupational Therapy

## 2022-04-12 DIAGNOSIS — I890 Lymphedema, not elsewhere classified: Secondary | ICD-10-CM

## 2022-04-12 NOTE — Therapy (Signed)
OUTPATIENT OCCUPATIONAL THERAPY TREATMENT NOTE BILATERAL  LOWER EXTREMITY LYMPHEDEMA  Patient Name: [name] MRN: [id number] DOB:12/05/1965, 57 y.o., female Today's Date: 04/12/2022  END OF SESSION:  OT End of Session - 03/15/22 1257     Visit Number 6   Number of Visits 36    Date for OT Re-Evaluation 05/30/22    OT Start Time 100   OT Stop Time 306   OT Time Calculation (min) 60 min    Activity Tolerance Patient tolerated treatment well;No increased pain    Behavior During Therapy [facility] for tasks assessed/performed                1513      Past Medical History:  Diagnosis Date   Anemia    Anxiety    Arthritis    Asthma    hx as child - no prob as adult - no inhaler   Blood transfusion 01/22/11   transfusion 2 units at [facility]   Depression 08/2010   psych assessment   Dyspnea    Fibroid    Headache(784.0)    rx for imitrex - last one jan   Keloid    Lymphedema    Pulmonary embolism ([place])    Past Surgical History:  Procedure Laterality Date   ABDOMINAL HYSTERECTOMY     COLONOSCOPY N/A 11/08/2017   Procedure: COLONOSCOPY;  Surgeon: Danie Binder, MD;  [facility];  Service: Endoscopy;  Laterality: N/A;  2:00pm   FLEXIBLE SIGMOIDOSCOPY N/A 09/17/2017   Procedure: FLEXIBLE SIGMOIDOSCOPY;  Surgeon: Danie Binder, MD;  Location: [facility];  Service: Endoscopy;  Laterality: N/A;   HERNIA REPAIR  [id number]   umbilical   POLYPECTOMY  11/08/2017   Procedure: POLYPECTOMY;  Surgeon: Danie Binder, MD;  [facility]: [facility];  Service: Endoscopy;;  ascending colon (CSx1), transverse colon (CS x1), splenic flexure (HSx1)   SVD     Spontaneous vaginal delivery; x 1   Patient Active Problem List   Diagnosis Date Noted   Rheumatoid arthritis ([place]) 03/06/2022   Lymphedema 03/06/2022   Morbid obesity ([place]) 09/14/2021   Chest pain 04/07/2021   Screening for malignant neoplasm of colon 03/09/2021   Constipation 03/09/2021   Iron deficiency anemia  03/09/2021   Body mass index (BMI) 45.0-49.9, adult ([place]) 08/20/2020   Chronic fatigue syndrome 08/20/2020   Polyarthralgia 08/20/2020   Vitamin D deficiency 08/20/2020   Cellulitis 12/31/2018   Elephantiasis 12/31/2018   Ischemic chest pain ([place]) 12/31/2018   History of pulmonary embolus (PE) 12/31/2018   Physical deconditioning 08/27/2018   Adenopathy 09/15/2017   Family history of colon cancer 08/01/2017   Taking multiple medications for chronic disease 08/01/2017   Axillary lymphadenopathy 01/26/2017   Urinary incontinence 01/25/2017   Hypokalemia 01/25/2017   Lobar pneumonia ([place]) 01/25/2017   Pressure ulcer of sacral region, stage 2 ([place]) 01/25/2017   Abnormal ECG 01/29/2013   Dyspnea    Menorrhagia with regular cycle 02/22/2011   Anemia 02/22/2011    PCP: Archie Patten, NP  REFERRING PROVIDER: Acquanetta Sit, DPM  REFERRING DIAG: I89.0  THERAPY DIAG:  Lymphedema, not elsewhere classified  ONSET DATE: 2018  SUBJECTIVE:  SUBJECTIVE STATEMENT: Kizzey Armbrust presents to OT for lymphedema care. Pt is accompanied by her adult daughter. Pt presenting with ongoing L leg wound that has been present since before commencing OT for CDT. Pt encouraged to call the wound clinic for a consult for several weeks. Today she is agreeabl;e to cqalling during our session to schedule an appointment.OT , Pt and her daughter discuss OT's concerns that Pt is only attending therapy 1 cx weekly instead of 2 x weekly. Daughter reports caregiver demands have limited their ability to attend more frequentyly.   PERTINENT HISTORY:  Severe, stage IV, BLE Lymphedema (Elephantiasis Nostras Verrucosa). ENV is a complication of sever, chronic, progressive Lymphedema.  HTN, blood clot disorder with PE x 2 in 2018, RA, Hx  recurrent cellulitis, severe morbid obesity (body mass index > 40.4 kg/m2).RA, deconditioning, hx pressure ulcer sacrum, hx non-pressure leg wound, hx depression hx chronic fatigue, hx keloids PAIN:  Are you having pain? Yes: NPRS scale: not rated/10 Pain location: L foot and  leg Pain description: throbbing, pulsating, aching, heavy, full, tight Aggravating factors: sleeping in chair with feet down, walking, standing Relieving factors: exercise, elevation, antibacterial soap, antibiotic powder  PRECAUTIONS: Fall, recurrent infections, hx non-pressure leg wound; current fungal infection-legs   IMPAIRMENTS AND FUNCTIONAL LIMITATIONS: difficulty walking, chronic, progressive limb swelling with associated pain and disfigurement, impaired balance, generalized debility, decreased BLE AROM, decreased strength, impaired endurance  impaired basic and instrumental ADLs ( bathing, dressing, grooming (skin inspection, skin and nail care), impaired functional ambulation, transfers and mobility, impaired home management, shopping, unable to cook and prep food, unable to drive, dependent for wc propulsion, unable to work, impaired social participation, impaired body image  PRIOR LEVEL OF FUNCTION: Requires assistive devices and varying levels of assistance with basic and instrumental ADLs, Needs assistance with functional ambulation, all transfers and functional mobility. Unable to work. Housebound due to impaired mobility and relies on others for transportation  PATIENT GOALS: keep swelling from getting worse so I don't lose my leg   OBJECTIVE:   OBSERVATIONS /ASSESSMENTS:     FOTO (functional outcomes measure): Intake score 03/01/22 44/100%  Lymphedema Life Impact Scale (LLIS) 61.76% (How much lymphedema related problems interfered with your life last week)  BLE COMPARATIVE LIMB VOLUMETRICS:   LANDMARK RIGHT   (TBA)  R LEG (A-D) N/A  R THIGH (E-G) ml  R FULL LIMB (A-G) ml  Limb Volume  differential (LVD)  %  Volume change since initial %  Volume change overall V  (Blank rows = not tested)  LANDMARK LEFT  03/14/22  R LEG (A-D) 14499.2 ml  R THIGH (E-G) 17195.4 ml  R FULL LIMB (A-G) 31694.6 ml  Limb Volume differential (LVD)  %  Volume change since initial %  Volume change overall %  (Blank rows = not tested)     TODAY'S TREATMENT:  Pt and family edu Skin care LLE multilayer compression wrapping   PATIENT EDUCATION:  Education details: Continued Pt and caregiver edu for gradient compression bandaging. Added edu today re importance of skin care, including skin inspection and hydration Person educated: Patient and [place] Education method: Explanation, Demonstration, Verbal and Tactile cues, and Handouts Education comprehension: verbalized understanding and returned demonstration  HOME EXERCISE PROGRAM: Daily , multilayer compression bandages to one limb at a time only during Intensive Phase CDT. Daily day and HOS  compression garments during Self-management Phase. Daily skin care w low ph lotion Daily lymphatic pumping there ex, 1 set of 10 each element, in order, bilaterally Daily simple self-MLD  ASSESSMENT:  CLINICAL IMPRESSION: Leaking wound on L calf continues to be quite malodorous with intermittent bloody lymphorrhea even with continuous compression wrapping. Pt has been using cream prescribed in August   of 2023, but wound appears to have nearly doubled in size and area of maceration around it is increased. Pt directed again to call wound clinic for consult for wound care and infection control. Emphasis of session on Pt and family edu  for the importance of proper skin care to limit infection. Pt educated re assistive devices, aka goose neck mirrors for skin inspection. Continued edu re multilayer compression  wrapping. Fabricated a soft foam pad for bottom  of foot and area of skin of leg that is in contact with the floor when walking to protect it from injury. Cont as per POC.  OBJECTIVE IMPAIRMENTS: Abnormal gait, decreased activity tolerance, decreased balance, decreased endurance, decreased knowledge of condition, decreased knowledge of use of DME, decreased mobility, difficulty walking, decreased ROM, decreased strength, dizziness, hypomobility, increased edema, increased fascial restrictions, impaired flexibility, impaired sensation, postural dysfunction, obesity, pain, and chronic, progressive swelling .   ACTIVITY LIMITATIONS: carrying, lifting, bending, standing, squatting, stairs, transfers, bed mobility, continence, bathing, toileting, dressing, hygiene/grooming, and caring for others  PARTICIPATION LIMITATIONS: meal prep, cleaning, laundry, driving, shopping, community activity, occupation, yard work, school, church, and caring for others  PERSONAL FACTORS: 3+ contributing Co morbidities, limited transportation, consistent, ongoing availability of caregivers, financial limitations are also affecting patient's functional outcome.   REHAB POTENTIAL: Lymphedema management has a high burden of care, especially for Patients who have difficulty, or who are unable to reach their distal legs and feet to apply bandages, compression garments, to bathe, inspect skin and groom nails. In this case because Pt is unable to perform these activities, daily caregiver assistance with all lower extremity lymphedema self-care home program components is essential for achieving clinical success and ongoing self-management. Without consistent, ongoing, daily caregiver assistance for compression bandaging and garments, skin care, and MLD Pt's prognosis is poor. With consistent caregiver assistance with lymphedema home program Pt's prognosis for improved lymphedema control and reducing infection risk is guarded, at best, due  to the advanced stage, multiple contributing co-morbidities, and sedentary lifestyle.  EVALUATION COMPLEXITY: High   GOALS: Goals reviewed with patient? YES  SHORT TERM GOALS: Target date: 6th OT visit   Pt will demonstrate understanding of lymphedema precautions and prevention strategies with modified independence using a printed reference to identify at least 5 precautions and discussing how s/he may implement them into daily life to reduce risk of progression with modified assistance. Baseline: Max A Goal status: INITIAL  2.   With Max caregiver assistance Pt will be able to apply multilayer, knee length, compression wraps using gradient techniques to decrease limb volume, to limit infection risk, and to limit lymphedema progression.  Baseline: Dependent Goal status: INITIAL  LONG TERM GOALS: Target date: 05/30/22  Given this patient's Intake score of 44/100% on the functional outcomes FOTO tool, patient will experience an increase in function of 3  points to improve basic and instrumental ADLs performance, including lymphedema self-care. Baseline: 44/100 Goal status: INITIAL  2.  Given this patient's Intake score of TBA % on the Lymphedema Life Impact Scale (LLIS), patient will experience a reduction of at least 5% in her perceived level of functional impairment resulting from lymphedema to improve functional performance and quality of life (QOL). Baseline: TBA Goal status: INITIAL  3.  With max caregiver assistance Pt will be able to don and doff appropriate compression garments and/or devices to control BLE lymphedema and to limit progression.  Baseline: Dependent Goal status: INITIAL  4.  With tolerance building strategies over time  Pt will be able to tolerate compression bandages and garments for at least 24 hours  to control BLE lymphedema and to limit progression.  Baseline: Max A Goal status: INITIAL  5.  With Max caregiver assistance Pt will achieve and sustain no less  than 85% compliance with all LE self-care home program components throughout CDT, including modified simple self-MLD, daily skin care and inspection, lymphatic pumping the ex and appropriate compression to limit lymphedema progression and to limit further functional decline. Baseline: Dependent Goal status: INITIAL  6.  Pt will diligently perform skin care regime prescribed by her doctor to eliminate fungal infection and prevent reoccurrence for 90 days Baseline: Dependent Goal status: INITIAL   OT PLAN OF CARE:  PT FREQUENCY: 2x/week  PT DURATION: other: 12 weeks and PRN  PLANNED INTERVENTIONS: Therapeutic exercises, Therapeutic activity, Patient/Family education, Self Care, DME instructions, Manual lymph drainage, Compression bandaging, Vasopneumatic device, Manual therapy, and skin care  PLAN FOR NEXT SESSION:  Skin care MLD Multilayer wraps Pt/ family edu  Andrey Spearman, MS, OTR/L, CLT-LANA 04/12/22 1:19 PM

## 2022-04-17 ENCOUNTER — Ambulatory Visit: Payer: Medicare HMO | Admitting: Occupational Therapy

## 2022-04-19 ENCOUNTER — Encounter: Payer: Self-pay | Admitting: Occupational Therapy

## 2022-04-19 ENCOUNTER — Ambulatory Visit: Payer: Medicare HMO | Admitting: Occupational Therapy

## 2022-04-19 DIAGNOSIS — I890 Lymphedema, not elsewhere classified: Secondary | ICD-10-CM | POA: Diagnosis not present

## 2022-04-19 NOTE — Therapy (Signed)
OUTPATIENT OCCUPATIONAL THERAPY TREATMENT NOTE BILATERAL  LOWER EXTREMITY LYMPHEDEMA  Patient Name: [name] MRN: [id number] DOB:09-16-1965, 57 y.o., female Today's Date: 04/19/2022  END OF SESSION:  OT End of Session - 03/15/22 1257     Visit Number 6   Number of Visits 36    Date for OT Re-Evaluation 05/30/22    OT Start Time 105   OT Stop Time 210   OT Time Calculation (min) 65 min    Activity Tolerance Patient tolerated treatment well;No increased pain    Behavior During Therapy [facility] for tasks assessed/performed                1513      Past Medical History:  Diagnosis Date   Anemia    Anxiety    Arthritis    Asthma    hx as child - no prob as adult - no inhaler   Blood transfusion 01/22/11   transfusion 2 units at [facility]   Depression 08/2010   psych assessment   Dyspnea    Fibroid    Headache(784.0)    rx for imitrex - last one jan   Keloid    Lymphedema    Pulmonary embolism ([place])    Past Surgical History:  Procedure Laterality Date   ABDOMINAL HYSTERECTOMY     COLONOSCOPY N/A 11/08/2017   Procedure: COLONOSCOPY;  Surgeon: Danie Binder, MD;  [facility];  Service: Endoscopy;  Laterality: N/A;  2:00pm   FLEXIBLE SIGMOIDOSCOPY N/A 09/17/2017   Procedure: FLEXIBLE SIGMOIDOSCOPY;  Surgeon: Danie Binder, MD;  [facility];  Service: Endoscopy;  Laterality: N/A;   HERNIA REPAIR  [id number]   umbilical   POLYPECTOMY  11/08/2017   Procedure: POLYPECTOMY;  Surgeon: Danie Binder, MD;  [facility];  Service: Endoscopy;;  ascending colon (CSx1), transverse colon (CS x1), splenic flexure (HSx1)   SVD     Spontaneous vaginal delivery; x 1   Patient Active Problem List   Diagnosis Date Noted   Rheumatoid arthritis ([place]) 03/06/2022   Lymphedema 03/06/2022   Morbid obesity ([place]) 09/14/2021   Chest pain 04/07/2021   Screening for malignant neoplasm of colon 03/09/2021   Constipation 03/09/2021   Iron deficiency anemia  03/09/2021   Body mass index (BMI) 45.0-49.9, adult ([place]) 08/20/2020   Chronic fatigue syndrome 08/20/2020   Polyarthralgia 08/20/2020   Vitamin D deficiency 08/20/2020   Cellulitis 12/31/2018   Elephantiasis 12/31/2018   Ischemic chest pain ([place]) 12/31/2018   History of pulmonary embolus (PE) 12/31/2018   Physical deconditioning 08/27/2018   Adenopathy 09/15/2017   Family history of colon cancer 08/01/2017   Taking multiple medications for chronic disease 08/01/2017   Axillary lymphadenopathy 01/26/2017   Urinary incontinence 01/25/2017   Hypokalemia 01/25/2017   Lobar pneumonia ([place]) 01/25/2017   Pressure ulcer of sacral region, stage 2 ([place]) 01/25/2017   Abnormal ECG 01/29/2013   Dyspnea    Menorrhagia with regular cycle 02/22/2011   Anemia 02/22/2011    PCP: Archie Patten, NP  REFERRING PROVIDER: Acquanetta Sit, DPM  REFERRING DIAG: I89.0  THERAPY DIAG:  Lymphedema, not elsewhere classified  ONSET DATE: 2018  SUBJECTIVE:  SUBJECTIVE STATEMENT: [name] presents to OT for lymphedema care. Pt is accompanied by her adult daughter. LE related pain is unchanged. Pt arrived too late to attend last session. Pt reports blister on her L heel and wound on posterior leg are unchanged.Pt has wound care consult on 05/09/22.  PERTINENT HISTORY:  Severe, stage IV, BLE Lymphedema (Elephantiasis Nostras Verrucosa). ENV is a complication of sever, chronic, progressive Lymphedema.  HTN, blood clot disorder with PE x 2 in 2018, RA, Hx recurrent cellulitis, severe morbid obesity (body mass index > 40.4 kg/m2).RA, deconditioning, hx pressure ulcer sacrum, hx non-pressure leg wound, hx depression hx chronic fatigue, hx keloids PAIN:  Are you having pain? Yes: NPRS scale: not rated/10 Pain location: L  foot and  leg Pain description: throbbing, pulsating, aching, heavy, full, tight Aggravating factors: sleeping in chair with feet down, walking, standing Relieving factors: exercise, elevation, antibacterial soap, antibiotic powder  PRECAUTIONS: Fall, recurrent infections, hx non-pressure leg wound;    IMPAIRMENTS AND FUNCTIONAL LIMITATIONS: difficulty walking, chronic, progressive limb swelling with associated pain and disfigurement, impaired balance, generalized debility, decreased BLE AROM, decreased strength, impaired endurance  impaired basic and instrumental ADLs ( bathing, dressing, grooming (skin inspection, skin and nail care), impaired functional ambulation, transfers and mobility, impaired home management, shopping, unable to cook and prep food, unable to drive, dependent for wc propulsion, unable to work, impaired social participation, impaired body image  PRIOR LEVEL OF FUNCTION: Requires assistive devices and varying levels of assistance with basic and instrumental ADLs, Needs assistance with functional ambulation, all transfers and functional mobility. Unable to work. Housebound due to impaired mobility and relies on others for transportation  PATIENT GOALS: keep swelling from getting worse so I don't lose my leg   OBJECTIVE:   OBSERVATIONS /ASSESSMENTS:     FOTO (functional outcomes measure): Intake score 03/01/22 44/100%  Lymphedema Life Impact Scale (LLIS) 61.76% (How much lymphedema related problems interfered with your life last week)  BLE COMPARATIVE LIMB VOLUMETRICS:   LANDMARK RIGHT   (TBA)  R LEG (A-D) N/A  R THIGH (E-G) ml  R FULL LIMB (A-G) ml  Limb Volume differential (LVD)  %  Volume change since initial %  Volume change overall V  (Blank rows = not tested)  LANDMARK LEFT  03/14/22  R LEG (A-D) 14499.2 ml  R THIGH (E-G) 17195.4 ml  R FULL LIMB (A-G) 31694.6 ml  Limb Volume differential (LVD)  %  Volume change since initial %  Volume change overall %   (Blank rows = not tested)     TODAY'S TREATMENT:                                                                                                                                         Pt and family edu LLE MLD Skin care LLE multilayer compression wrapping   PATIENT EDUCATION:  Education details: Continued Pt and  caregiver edu for gradient compression bandaging.Commenced edu re MLD, including diaphragmatic breathing and J stroke for short neck sequence.  Person educated: Patient Education method: Explanation, Demonstration, Verbal and Tactile cues, and Handouts Education comprehension: verbalized understanding and returned demonstration  HOME EXERCISE PROGRAM: Daily , multilayer compression bandages to one limb at a time only during Intensive Phase CDT. Daily day and HOS  compression garments during Self-management Phase. Daily skin care w low ph lotion Daily lymphatic pumping there ex, 1 set of 10 each element, in order, bilaterally Daily simple self-MLD  ASSESSMENT:  CLINICAL IMPRESSION: commenced  LLE utilizing short neck sequence, deep abdominal breathing, functional inguinals LN, and proximal to distal dynamic J strokes from groin to knee, knee, leg , and foot. Completed 3 distal to proximal J stroke sweeps and finished with 3 short neck repeats. Excellent tolerance. Reapplied compression wraps as established. Cont as per OC.  OBJECTIVE IMPAIRMENTS: Abnormal gait, decreased activity tolerance, decreased balance, decreased endurance, decreased knowledge of condition, decreased knowledge of use of DME, decreased mobility, difficulty walking, decreased ROM, decreased strength, dizziness, hypomobility, increased edema, increased fascial restrictions, impaired flexibility, impaired sensation, postural dysfunction, obesity, pain, and chronic, progressive swelling .   ACTIVITY LIMITATIONS: carrying, lifting, bending, standing, squatting, stairs, transfers, bed mobility, continence,  bathing, toileting, dressing, hygiene/grooming, and caring for others  PARTICIPATION LIMITATIONS: meal prep, cleaning, laundry, driving, shopping, community activity, occupation, yard work, school, church, and caring for others  PERSONAL FACTORS: 3+ contributing Co morbidities, limited transportation, consistent, ongoing availability of caregivers, financial limitations are also affecting patient's functional outcome.   REHAB POTENTIAL: Lymphedema management has a high burden of care, especially for Patients who have difficulty, or who are unable to reach their distal legs and feet to apply bandages, compression garments, to bathe, inspect skin and groom nails. In this case because Pt is unable to perform these activities, daily caregiver assistance with all lower extremity lymphedema self-care home program components is essential for achieving clinical success and ongoing self-management. Without consistent, ongoing, daily caregiver assistance for compression bandaging and garments, skin care, and MLD Pt's prognosis is poor. With consistent caregiver assistance with lymphedema home program Pt's prognosis for improved lymphedema control and reducing infection risk is guarded, at best, due to the advanced stage, multiple contributing co-morbidities, and sedentary lifestyle.  EVALUATION COMPLEXITY: High   GOALS: Goals reviewed with patient? YES  SHORT TERM GOALS: Target date: 6th OT visit   Pt will demonstrate understanding of lymphedema precautions and prevention strategies with modified independence using a printed reference to identify at least 5 precautions and discussing how s/he may implement them into daily life to reduce risk of progression with modified assistance. Baseline: Max A Goal status: INITIAL  2.   With Max caregiver assistance Pt will be able to apply multilayer, knee length, compression wraps using gradient techniques to decrease limb volume, to limit infection risk, and to  limit lymphedema progression.  Baseline: Dependent Goal status: INITIAL  LONG TERM GOALS: Target date: 05/30/22  Given this patient's Intake score of 44/100% on the functional outcomes FOTO tool, patient will experience an increase in function of 3  points to improve basic and instrumental ADLs performance, including lymphedema self-care. Baseline: 44/100 Goal status: INITIAL  2.  Given this patient's Intake score of TBA % on the Lymphedema Life Impact Scale (LLIS), patient will experience a reduction of at least 5% in her perceived level of functional impairment resulting from lymphedema to improve functional performance and quality of life (QOL). Baseline:  TBA Goal status: INITIAL  3.  With max caregiver assistance Pt will be able to don and doff appropriate compression garments and/or devices to control BLE lymphedema and to limit progression.  Baseline: Dependent Goal status: INITIAL  4.  With tolerance building strategies over time  Pt will be able to tolerate compression bandages and garments for at least 24 hours  to control BLE lymphedema and to limit progression.  Baseline: Max A Goal status: INITIAL  5.  With Max caregiver assistance Pt will achieve and sustain no less than 85% compliance with all LE self-care home program components throughout CDT, including modified simple self-MLD, daily skin care and inspection, lymphatic pumping the ex and appropriate compression to limit lymphedema progression and to limit further functional decline. Baseline: Dependent Goal status: INITIAL  6.  Pt will diligently perform skin care regime prescribed by her doctor to eliminate fungal infection and prevent reoccurrence for 90 days Baseline: Dependent Goal status: INITIAL   OT PLAN OF CARE:  PT FREQUENCY: 2x/week  PT DURATION: other: 12 weeks and PRN  PLANNED INTERVENTIONS: Therapeutic exercises, Therapeutic activity, Patient/Family education, Self Care, DME instructions, Manual lymph  drainage, Compression bandaging, Vasopneumatic device, Manual therapy, and skin care  PLAN FOR NEXT SESSION:  Skin care MLD Multilayer wraps Pt/ family edu  Andrey Spearman, MS, OTR/L, CLT-LANA 04/19/22 3:59 PM

## 2022-04-20 DIAGNOSIS — M0579 Rheumatoid arthritis with rheumatoid factor of multiple sites without organ or systems involvement: Secondary | ICD-10-CM | POA: Diagnosis not present

## 2022-04-24 ENCOUNTER — Ambulatory Visit: Payer: Medicare HMO | Admitting: Gastroenterology

## 2022-04-24 ENCOUNTER — Encounter: Payer: Self-pay | Admitting: Gastroenterology

## 2022-04-24 VITALS — BP 135/79 | HR 99 | Temp 97.8°F | Ht 67.0 in | Wt 327.8 lb

## 2022-04-24 DIAGNOSIS — Z800 Family history of malignant neoplasm of digestive organs: Secondary | ICD-10-CM

## 2022-04-24 NOTE — Progress Notes (Signed)
Pt. States that she is 58' ,when measured in admitting she was 5'7" with a thick boot in place on rt.foot. This brought her BMI to 51.  Osvaldo Angst CRNA spoke to patient about our policy and that she would not be able to have her procedure done safely here but would need to be done at the hospital.  Patient became argumentative and would not listen to options available to her.  She abruptly walked out.

## 2022-04-25 ENCOUNTER — Ambulatory Visit: Payer: Medicare HMO | Admitting: Occupational Therapy

## 2022-04-25 ENCOUNTER — Ambulatory Visit (INDEPENDENT_AMBULATORY_CARE_PROVIDER_SITE_OTHER): Payer: Medicare HMO | Admitting: Podiatry

## 2022-04-25 VITALS — BP 113/58

## 2022-04-25 DIAGNOSIS — M79675 Pain in left toe(s): Secondary | ICD-10-CM

## 2022-04-25 DIAGNOSIS — B351 Tinea unguium: Secondary | ICD-10-CM | POA: Diagnosis not present

## 2022-04-25 DIAGNOSIS — M79674 Pain in right toe(s): Secondary | ICD-10-CM | POA: Diagnosis not present

## 2022-04-25 NOTE — Progress Notes (Unsigned)
  Subjective:  Patient ID: [name], female    DOB: 28-Jul-1965,  MRN: [id number]  [name] presents to clinic today for {jgcomplaint:23593}  Chief Complaint  Patient presents with   Nail Problem    RFC PCP-Fleming, [name] PCP VST-2 months ago   New problem(s): None. {jgcomplaint:23593}  PCP is Gildardo Pounds, NP.  Allergies  Allergen Reactions   Abatacept Anaphylaxis    Other reaction(s): Anaphylaxis-Streptomycin Clickject Other reaction(s): Anaphylaxis-Streptomycin   Bee Venom Anaphylaxis   Augmentin [Amoxicillin-Pot Clavulanate] Hives, Itching and Other (See Comments)    Did PCN reaction causing immediate rash, facial/tongue/throat swelling, SOB or lightheadedness with hypotension: yes Did PCN reaction causing severe rash involving mucus membranes or skin necrosis: no Has patient had a PCN reaction that required hospitalization: in hospital Has patient had a PCN reaction occurring within the last 10 years: no If all of the above answers are "NO", then may proceed with Cephalosporin use.  Pt reports no recollection of any reactions when taking penicillin in past   Latex Hives and Other (See Comments)    Local reaction (welts). Patient denies any wheezing or other reaction with latex exposure   Nulytely [Peg 3350-Kcl-Na Bicarb-Nacl]     NAUSEA AND VOMITING. MAY TOLERATE LOW VOLUME PREP.    Review of Systems: Negative except as noted in the HPI.  Objective: No changes noted in today's physical examination. Vitals:   04/25/22 1029  BP: (!) 113/58   [name] is a pleasant 57 y.o. female morbidly obese in NAD. AAO x 3.  Vascular Capillary refill time to digits immediate b/l. Palpable pedal pulses b/l LE. Pedal hair absent. No pain with calf compression b/l. Lymphedema with elephantiasis present BLE. No ischemia or gangrene noted b/l LE. No cyanosis or clubbing noted b/l LE.  Neurologic Normal speech. Oriented to person, place, and time. Protective  sensation intact 5/5 intact bilaterally with 10g monofilament b/l. Vibratory sensation intact b/l.  Dermatologic No interdigital macerations noted b/l LE. Toenails 1-5 b/l elongated, discolored, dystrophic, thickened, crumbly with subungual debris and tenderness to dorsal palpation. Skin b/l lower extremities noted to be thickened and brawny consistent with lymphedema.  Orthopedic: Muscle strength 5/5 to all lower extremity muscle groups bilaterally. Sausage digits 1-5 b/l. Utilizes rollator for ambulation assistance.    Radiographs: None Assessment/Plan: 1. Pain due to onychomycosis of toenails of both feet     No orders of the defined types were placed in this encounter.   None {Jgplan:23602::"-Patient/POA to call should there be question/concern in the interim."}   Return in about 3 months (around 07/24/2022).  Marzetta Board, DPM

## 2022-04-26 ENCOUNTER — Ambulatory Visit: Payer: Medicare HMO | Attending: Nurse Practitioner

## 2022-04-26 VITALS — Ht 69.0 in | Wt 327.0 lb

## 2022-04-26 DIAGNOSIS — Z0000 Encounter for general adult medical examination without abnormal findings: Secondary | ICD-10-CM | POA: Diagnosis not present

## 2022-04-26 NOTE — Progress Notes (Signed)
I connected with  [name] on 04/26/22 by a audio enabled telemedicine application and verified that I am speaking with the correct person using two identifiers.  Patient Location: Home  Provider Location: Office/Clinic  I discussed the limitations of evaluation and management by telemedicine. The patient expressed understanding and agreed to proceed.  Subjective:   [name] is a 57 y.o. female who presents for Medicare Annual (Subsequent) preventive examination.  Review of Systems     Cardiac Risk Factors include: advanced age (>25mn, >>9women);obesity (BMI >30kg/m2)     Objective:    Today's Vitals   04/26/22 1428 04/26/22 1431  Weight: (!) 327 lb (148.3 kg)   Height: '5\' 9"'$  (1.753 m)   PainSc:  7    Body mass index is 48.29 kg/m.     04/26/2022    2:38 PM 03/01/2022    3:30 PM 10/04/2021    9:40 PM 09/07/2021    1:15 PM 04/07/2021    9:11 PM 04/07/2021   11:44 AM 02/21/2021    7:47 PM  Advanced Directives  Does Patient Have a Medical Advance Directive? No No No No No No No  Would patient like information on creating a medical advance directive?   No - Patient declined  No - Patient declined No - Patient declined     Current Medications (verified) Outpatient Encounter Medications as of 04/26/2022  Medication Sig   albuterol (VENTOLIN HFA) 108 (90 Base) MCG/ACT inhaler Inhale 2 puffs into the lungs every 6 (six) hours as needed for wheezing or shortness of breath.   CIMZIA 2 X 200 MG/ML PSKT Inject into the skin every 14 (fourteen) days.   clotrimazole (CLOTRIMAZOLE ANTI-FUNGAL) 1 % cream Apply 1 Application topically 2 (two) times daily.   famotidine (PEPCID) 40 MG tablet Take 1 tablet (40 mg total) by mouth daily.   folic acid (FOLVITE) 1 MG tablet Take 1 tablet by mouth once daily   furosemide (LASIX) 20 MG tablet Take 1 tablet (20 mg total) by mouth daily. X 7 days then prn swelling   losartan (COZAAR) 25 MG tablet Take 1 tablet (25 mg total) by mouth  daily.   methotrexate 50 MG/2ML injection Inject 20 mg into the skin once a week.   Misc. Devices (BARIATRIC ROLLATOR) MISC Please provide patient with insurance approved bariatric rollator walker. I89.0, R53.81, R26.81   apixaban (ELIQUIS) 5 MG TABS tablet Take 1 tablet (5 mg total) by mouth 2 (two) times daily.   No facility-administered encounter medications on file as of 04/26/2022.    Allergies (verified) Abatacept, Bee venom, Augmentin [amoxicillin-pot clavulanate], Latex, and Nulytely [peg 3350-kcl-na bicarb-nacl]   History: Past Medical History:  Diagnosis Date   Anemia    Anxiety    Arthritis    Asthma    hx as child - no prob as adult - no inhaler   Blood transfusion 01/22/11   [facility]  Depression 08/2010   psych assessment   Dyspnea    Fibroid    Headache(784.0)    rx for imitrex - last one jan   Keloid    Lymphedema    Pulmonary embolism ([facility]    Past Surgical History:  Procedure Laterality Date   ABDOMINAL HYSTERECTOMY     COLONOSCOPY N/A 11/08/2017   Procedure: COLONOSCOPY;  Surgeon: FDanie Binder MD;  [facility];  Service: Endoscopy;  Laterality: N/A;  2:00pm   FLEXIBLE SIGMOIDOSCOPY N/A 09/17/2017   Procedure:  FLEXIBLE SIGMOIDOSCOPY;  Surgeon: Danie Binder, MD;  Location: [facility];  Service: Endoscopy;  Laterality: N/A;   HERNIA REPAIR  [id number]   umbilical   POLYPECTOMY  11/08/2017   Procedure: POLYPECTOMY;  Surgeon: Danie Binder, MD;  Location: [facility];  Service: Endoscopy;;  ascending colon (CSx1), transverse colon (CS x1), splenic flexure (HSx1)   SVD     Spontaneous vaginal delivery; x 1   Family History  Problem Relation Age of Onset   Heart disease Mother    Hypertension Mother    Hypertension Father    Colon cancer Father 44       Passed away 55 yrs old   Hypertension Sister    Cancer Maternal Grandmother        gastric cancer   Cancer Maternal Grandfather        pancreatic cancer   Colon  cancer Paternal Grandmother    Colon cancer Paternal Uncle    Colon polyps Neg Hx    Social History   Socioeconomic History   Marital status: Single    Spouse name: Not on file   Number of children: 1   Years of education: Not on file   Highest education level: Not on file  Occupational History   Occupation: medical billing    Employer: [employer]  Tobacco Use   Smoking status: Never   Smokeless tobacco: Never  Vaping Use   Vaping Use: Never used  Substance and Sexual Activity   Alcohol use: No   Drug use: No   Sexual activity: Not Currently    Birth control/protection: None, Surgical  Other Topics Concern   Not on file  Social History Narrative   Not on file   Social Determinants of Health   Financial Resource Strain: Low Risk  (04/26/2022)   Overall Financial Resource Strain (CARDIA)    Difficulty of Paying Living Expenses: Not hard at all  Food Insecurity: No Food Insecurity (04/26/2022)   Hunger Vital Sign    Worried About Running Out of Food in the Last Year: Never true    Ran Out of Food in the Last Year: Never true  Transportation Needs: No Transportation Needs (04/26/2022)   PRAPARE - Transportation    Lack of Transportation (Medical): No    Lack of Transportation (Non-Medical): No  Physical Activity: Insufficiently Active (04/26/2022)   Exercise Vital Sign    Days of Exercise per Week: 2 days    Minutes of Exercise per Session: 60 min  Stress: No Stress Concern Present (04/26/2022)   [place]    Feeling of Stress : Not at all  Social Connections: Socially Isolated (10/30/2020)   Social Connection and Isolation Panel [NHANES]    Frequency of Communication with Friends and Family: Three times a week    Frequency of Social Gatherings with Friends and Family: More than three times a week    Attends Religious Services: Never    Marine scientist or Organizations: No    Attends Arts administrator: Never    Marital Status: Never married    Tobacco Counseling Counseling given: Not Answered   Clinical Intake:  Pre-visit preparation completed: Yes  Pain : 0-10 Pain Score: 7  Pain Type: Chronic pain Pain Location: Leg Pain Orientation: Left Pain Descriptors / Indicators: Aching Pain Onset: More than a month ago Pain Frequency: Constant     Nutritional Status: BMI > 30  Obese Nutritional Risks:  Nausea/ vomitting/ diarrhea (diarrhea and nausea do to colonoscopy prep) Diabetes: No  How often do you need to have someone help you when you read instructions, pamphlets, or other written materials from your doctor or pharmacy?: 1 - Never  Diabetic? no  Interpreter Needed?: No  Information entered by :: NAllen LPN   Activities of Daily Living    04/26/2022    2:40 PM  In your present state of health, do you have any difficulty performing the following activities:  Hearing? 0  Vision? 1  Difficulty concentrating or making decisions? 0  Walking or climbing stairs? 1  Dressing or bathing? 0  Doing errands, shopping? 0  Preparing Food and eating ? N  Using the Toilet? N  In the past six months, have you accidently leaked urine? Y  Comment incontinent  Do you have problems with loss of bowel control? N  Managing your Medications? N  Managing your Finances? N  Housekeeping or managing your Housekeeping? Y    Patient Care Team: Gildardo Pounds, NP as PCP - General (Nurse Practitioner) Early Osmond, MD as PCP - Cardiology (Cardiology) Danie Binder, MD (Inactive) as Consulting Physician (Gastroenterology) Juan Quam, MD as Referring Physician (Orthopedic Surgery)  Indicate any recent Medical Services you may have received from other than [facility] providers in the past year (date may be approximate).     Assessment:   This is a routine wellness examination for [place].  Hearing/Vision screen Vision Screening - Comments:: No regular eye  exams,  Dietary issues and exercise activities discussed: Current Exercise Habits: Home exercise routine, Type of exercise: walking;strength training/weights, Time (Minutes): 60, Frequency (Times/Week): 2, Weekly Exercise (Minutes/Week): 120   Goals Addressed             This Visit's Progress    Patient Stated       04/26/2022, wants to get legs smaller so she can drive again       Depression Screen    04/26/2022    2:39 PM 03/06/2022   11:33 AM 11/30/2021   11:54 AM 05/05/2021    2:30 PM 01/14/2021    1:52 PM 10/30/2020   11:20 AM 10/12/2020    1:47 PM  PHQ 2/9 Scores  PHQ - 2 Score 2 0 0 0 0 0 0  PHQ- 9 Score 4 2 0 2 2      Fall Risk    04/26/2022    2:39 PM 03/06/2022   11:28 AM 11/30/2021   11:24 AM 01/14/2021    1:52 PM 10/30/2020   11:26 AM  Fall Risk   Falls in the past year? 0 0 0 0 0  Number falls in past yr: 0 0 0 0 0  Injury with Fall? 0 0 0 0 0  Risk for fall due to : Impaired mobility;Medication side effect;Impaired balance/gait    No Fall Risks  Follow up Falls prevention discussed;Education provided;Falls evaluation completed Falls evaluation completed   Falls evaluation completed    FALL RISK PREVENTION PERTAINING TO THE HOME:  Any stairs in or around the home? Yes  If so, are there any without handrails? No  Home free of loose throw rugs in walkways, pet beds, electrical cords, etc? Yes  Adequate lighting in your home to reduce risk of falls? Yes   ASSISTIVE DEVICES UTILIZED TO PREVENT FALLS:  Life alert? No  Use of a cane, walker or w/c? Yes  Grab bars in the bathroom? No  Shower chair or  bench in shower? Yes  Elevated toilet seat or a handicapped toilet? Yes   TIMED UP AND GO:  Was the test performed? No .      Cognitive Function:        04/26/2022    2:43 PM 10/30/2020   11:30 AM  6CIT Screen  What Year? 0 points 0 points  What month? 0 points 0 points  What time? 0 points 0 points  Count back from 20 0 points 0 points  Months in  reverse 0 points 0 points  Repeat phrase 0 points 0 points  Total Score 0 points 0 points    Immunizations Immunization History  Administered Date(s) Administered   Influenza Split 01/23/2011   PNEUMOCOCCAL CONJUGATE-20 01/14/2021   PPD Test 05/19/2013   Tdap 02/21/2021    TDAP status: Up to date  Flu Vaccine status: Declined, Education has been provided regarding the importance of this vaccine but patient still declined. Advised may receive this vaccine at local pharmacy or Health Dept. Aware to provide a copy of the vaccination record if obtained from local pharmacy or Health Dept. Verbalized acceptance and understanding.  Pneumococcal vaccine status: Up to date  Covid-19 vaccine status: Information provided on how to obtain vaccines.   Qualifies for Shingles Vaccine? Yes   Zostavax completed No   Shingrix Completed?: No.    Education has been provided regarding the importance of this vaccine. Patient has been advised to call insurance company to determine out of pocket expense if they have not yet received this vaccine. Advised may also receive vaccine at local pharmacy or Health Dept. Verbalized acceptance and understanding.  Screening Tests Health Maintenance  Topic Date Due   COVID-19 Vaccine (1) Never done   Zoster Vaccines- Shingrix (1 of 2) Never done   Medicare Annual Wellness (AWV)  10/30/2021   INFLUENZA VACCINE  05/28/2022 (Originally 09/27/2021)   MAMMOGRAM  09/30/2022   COLONOSCOPY (Pts 45-23yr Insurance coverage will need to be confirmed)  11/09/2027   DTaP/Tdap/Td (2 - Td or Tdap) 02/22/2031   Hepatitis C Screening  Completed   HIV Screening  Completed   HPV VACCINES  Aged Out   PAP SMEAR-Modifier  Discontinued    Health Maintenance  Health Maintenance Due  Topic Date Due   COVID-19 Vaccine (1) Never done   Zoster Vaccines- Shingrix (1 of 2) Never done   Medicare Annual Wellness (AWV)  10/30/2021    Colorectal cancer screening: Type of screening:  Colonoscopy. Completed 11/08/2017. Repeat every 5 years  Mammogram status: scheduled for 05/03/2022  Bone Density status: n/a  Lung Cancer Screening: (Low Dose CT Chest recommended if Age 55-80years, 30 pack-year currently smoking OR have quit w/in 15years.) does not qualify.   Lung Cancer Screening Referral: no  Additional Screening:  Hepatitis C Screening: does qualify; Completed 08/27/2018  Vision Screening: Recommended annual ophthalmology exams for early detection of glaucoma and other disorders of the eye. Is the patient up to date with their annual eye exam?  No  Who is the provider or what is the name of the office in which the patient attends annual eye exams? none If pt is not established with a provider, would they like to be referred to a provider to establish care? No .   Dental Screening: Recommended annual dental exams for proper oral hygiene  Community Resource Referral / Chronic Care Management: CRR required this visit?  No   CCM required this visit?  No      Plan:  I have personally reviewed and noted the following in the patient's chart:   Medical and social history Use of alcohol, tobacco or illicit drugs  Current medications and supplements including opioid prescriptions. Patient is not currently taking opioid prescriptions. Functional ability and status Nutritional status Physical activity Advanced directives List of other physicians Hospitalizations, surgeries, and ER visits in previous 12 months Vitals Screenings to include cognitive, depression, and falls Referrals and appointments  In addition, I have reviewed and discussed with patient certain preventive protocols, quality metrics, and best practice recommendations. A written personalized care plan for preventive services as well as general preventive health recommendations were provided to patient.     Kellie Simmering, LPN   [id number]   Nurse Notes: none  Due to this being a virtual  visit, the after visit summary with patients personalized plan was offered to patient via mail or my-chart. Patient would like to access on my-chart

## 2022-04-26 NOTE — Patient Instructions (Signed)
Ms. [name] , Thank you for taking time to come for your Medicare Wellness Visit. I appreciate your ongoing commitment to your health goals. Please review the following plan we discussed and let me know if I can assist you in the future.   These are the goals we discussed:  Goals      Patient Stated     04/26/2022, wants to get legs smaller so she can drive again        This is a list of the screening recommended for you and due dates:  Health Maintenance  Topic Date Due   COVID-19 Vaccine (1) Never done   Zoster (Shingles) Vaccine (1 of 2) Never done   Flu Shot  05/28/2022*   Mammogram  09/30/2022   Medicare Annual Wellness Visit  04/27/2023   Colon Cancer Screening  11/09/2027   DTaP/Tdap/Td vaccine (2 - Td or Tdap) 02/22/2031   Hepatitis C Screening: USPSTF Recommendation to screen - Ages 61-79 yo.  Completed   HIV Screening  Completed   HPV Vaccine  Aged Out   Pap Smear  Discontinued  *Topic was postponed. The date shown is not the original due date.    Advanced directives: Advance directive discussed with you today.   Conditions/risks identified: none  Next appointment: Follow up in one year for your annual wellness visit.   Preventive Care 40-64 Years, Female Preventive care refers to lifestyle choices and visits with your health care provider that can promote health and wellness. What does preventive care include? A yearly physical exam. This is also called an annual well check. Dental exams once or twice a year. Routine eye exams. Ask your health care provider how often you should have your eyes checked. Personal lifestyle choices, including: Daily care of your teeth and gums. Regular physical activity. Eating a healthy diet. Avoiding tobacco and drug use. Limiting alcohol use. Practicing safe sex. Taking low-dose aspirin daily starting at age 70. Taking vitamin and mineral supplements as recommended by your health care provider. What happens during an annual  well check? The services and screenings done by your health care provider during your annual well check will depend on your age, overall health, lifestyle risk factors, and family history of disease. Counseling  Your health care provider may ask you questions about your: Alcohol use. Tobacco use. Drug use. Emotional well-being. Home and relationship well-being. Sexual activity. Eating habits. Work and work Statistician. Method of birth control. Menstrual cycle. Pregnancy history. Screening  You may have the following tests or measurements: Height, weight, and BMI. Blood pressure. Lipid and cholesterol levels. These may be checked every 5 years, or more frequently if you are over 71 years old. Skin check. Lung cancer screening. You may have this screening every year starting at age 29 if you have a 30-pack-year history of smoking and currently smoke or have quit within the past 15 years. Fecal occult blood test (FOBT) of the stool. You may have this test every year starting at age 2. Flexible sigmoidoscopy or colonoscopy. You may have a sigmoidoscopy every 5 years or a colonoscopy every 10 years starting at age 8. Hepatitis C blood test. Hepatitis B blood test. Sexually transmitted disease (STD) testing. Diabetes screening. This is done by checking your blood sugar (glucose) after you have not eaten for a while (fasting). You may have this done every 1-3 years. Mammogram. This may be done every 1-2 years. Talk to your health care provider about when you should start having regular mammograms.  This may depend on whether you have a family history of breast cancer. BRCA-related cancer screening. This may be done if you have a family history of breast, ovarian, tubal, or peritoneal cancers. Pelvic exam and Pap test. This may be done every 3 years starting at age 46. Starting at age 90, this may be done every 5 years if you have a Pap test in combination with an HPV test. Bone density scan.  This is done to screen for osteoporosis. You may have this scan if you are at high risk for osteoporosis. Discuss your test results, treatment options, and if necessary, the need for more tests with your health care provider. Vaccines  Your health care provider may recommend certain vaccines, such as: Influenza vaccine. This is recommended every year. Tetanus, diphtheria, and acellular pertussis (Tdap, Td) vaccine. You may need a Td booster every 10 years. Zoster vaccine. You may need this after age 76. Pneumococcal 13-valent conjugate (PCV13) vaccine. You may need this if you have certain conditions and were not previously vaccinated. Pneumococcal polysaccharide (PPSV23) vaccine. You may need one or two doses if you smoke cigarettes or if you have certain conditions. Talk to your health care provider about which screenings and vaccines you need and how often you need them. This information is not intended to replace advice given to you by your health care provider. Make sure you discuss any questions you have with your health care provider. Document Released: 03/12/2015 Document Revised: 11/03/2015 Document Reviewed: 12/15/2014 Elsevier Interactive Patient Education  2017 [employer] Prevention in the Home Falls can cause injuries. They can happen to people of all ages. There are many things you can do to make your home safe and to help prevent falls. What can I do on the outside of my home? Regularly fix the edges of walkways and driveways and fix any cracks. Remove anything that might make you trip as you walk through a door, such as a raised step or threshold. Trim any bushes or trees on the path to your home. Use bright outdoor lighting. Clear any walking paths of anything that might make someone trip, such as rocks or tools. Regularly check to see if handrails are loose or broken. Make sure that both sides of any steps have handrails. Any raised decks and porches should have  guardrails on the edges. Have any leaves, snow, or ice cleared regularly. Use sand or salt on walking paths during winter. Clean up any spills in your garage right away. This includes oil or grease spills. What can I do in the bathroom? Use night lights. Install grab bars by the toilet and in the tub and shower. Do not use towel bars as grab bars. Use non-skid mats or decals in the tub or shower. If you need to sit down in the shower, use a plastic, non-slip stool. Keep the floor dry. Clean up any water that spills on the floor as soon as it happens. Remove soap buildup in the tub or shower regularly. Attach bath mats securely with double-sided non-slip rug tape. Do not have throw rugs and other things on the floor that can make you trip. What can I do in the bedroom? Use night lights. Make sure that you have a light by your bed that is easy to reach. Do not use any sheets or blankets that are too big for your bed. They should not hang down onto the floor. Have a firm chair that has side arms.  You can use this for support while you get dressed. Do not have throw rugs and other things on the floor that can make you trip. What can I do in the kitchen? Clean up any spills right away. Avoid walking on wet floors. Keep items that you use a lot in easy-to-reach places. If you need to reach something above you, use a strong step stool that has a grab bar. Keep electrical cords out of the way. Do not use floor polish or wax that makes floors slippery. If you must use wax, use non-skid floor wax. Do not have throw rugs and other things on the floor that can make you trip. What can I do with my stairs? Do not leave any items on the stairs. Make sure that there are handrails on both sides of the stairs and use them. Fix handrails that are broken or loose. Make sure that handrails are as long as the stairways. Check any carpeting to make sure that it is firmly attached to the stairs. Fix any carpet  that is loose or worn. Avoid having throw rugs at the top or bottom of the stairs. If you do have throw rugs, attach them to the floor with carpet tape. Make sure that you have a light switch at the top of the stairs and the bottom of the stairs. If you do not have them, ask someone to add them for you. What else can I do to help prevent falls? Wear shoes that: Do not have high heels. Have rubber bottoms. Are comfortable and fit you well. Are closed at the toe. Do not wear sandals. If you use a stepladder: Make sure that it is fully opened. Do not climb a closed stepladder. Make sure that both sides of the stepladder are locked into place. Ask someone to hold it for you, if possible. Clearly mark and make sure that you can see: Any grab bars or handrails. First and last steps. Where the edge of each step is. Use tools that help you move around (mobility aids) if they are needed. These include: Canes. Walkers. Scooters. Crutches. Turn on the lights when you go into a dark area. Replace any light bulbs as soon as they burn out. Set up your furniture so you have a clear path. Avoid moving your furniture around. If any of your floors are uneven, fix them. If there are any pets around you, be aware of where they are. Review your medicines with your doctor. Some medicines can make you feel dizzy. This can increase your chance of falling. Ask your doctor what other things that you can do to help prevent falls. This information is not intended to replace advice given to you by your health care provider. Make sure you discuss any questions you have with your health care provider. Document Released: 12/10/2008 Document Revised: 07/22/2015 Document Reviewed: 03/20/2014 Elsevier Interactive Patient Education  2017 [employer].

## 2022-05-01 ENCOUNTER — Ambulatory Visit: Payer: Medicare HMO | Attending: Podiatry | Admitting: Occupational Therapy

## 2022-05-01 DIAGNOSIS — I890 Lymphedema, not elsewhere classified: Secondary | ICD-10-CM | POA: Diagnosis not present

## 2022-05-01 NOTE — Therapy (Signed)
OUTPATIENT OCCUPATIONAL THERAPY TREATMENT NOTE BILATERAL  LOWER EXTREMITY LYMPHEDEMA  Patient Name: [name] MRN: [id number] DOB:1966-02-02, 57 y.o., female Today's Date: 05/01/2022  END OF SESSION:  OT End of Session - 05/01/22 1349     Visit Number 8    Number of Visits 36    Date for OT Re-Evaluation 05/30/22    OT Start Time 0120    OT Stop Time 0150    OT Time Calculation (min) 30 min    Activity Tolerance Patient tolerated treatment well;No increased pain    Behavior During Therapy WFL for tasks assessed/performed              Past Medical History:  Diagnosis Date   Anemia    Anxiety    Arthritis    Asthma    hx as child - no prob as adult - no inhaler   Blood transfusion 01/22/11   transfusion 2 units at [facility]   Depression 08/2010   psych assessment   Dyspnea    Fibroid    Headache(784.0)    rx for imitrex - last one jan   Keloid    Lymphedema    Pulmonary embolism ([place])    Past Surgical History:  Procedure Laterality Date   ABDOMINAL HYSTERECTOMY     COLONOSCOPY N/A 11/08/2017   Procedure: COLONOSCOPY;  Surgeon: Danie Binder, MD;  Location[facility];  Service: Endoscopy;  Laterality: N/A;  2:00pm   FLEXIBLE SIGMOIDOSCOPY N/A 09/17/2017   Procedure: FLEXIBLE SIGMOIDOSCOPY;  Surgeon: Danie Binder, MD;  Location: [facility];  Service: Endoscopy;  Laterality: N/A;   HERNIA REPAIR  [id number]   umbilical   POLYPECTOMY  11/08/2017   Procedure: POLYPECTOMY;  Surgeon: Danie Binder, MD;  Location: [facility];  Service: Endoscopy;;  ascending colon (CSx1), transverse colon (CS x1), splenic flexure (HSx1)   SVD     Spontaneous vaginal delivery; x 1   Patient Active Problem List   Diagnosis Date Noted   Rheumatoid arthritis ([place]) 03/06/2022   Lymphedema 03/06/2022   Morbid obesity ([place]) 09/14/2021   Chest pain 04/07/2021   Screening for malignant neoplasm of colon 03/09/2021   Constipation 03/09/2021   Iron deficiency anemia 03/09/2021    Body mass index (BMI) 45.0-49.9, adult ([place]) 08/20/2020   Chronic fatigue syndrome 08/20/2020   Polyarthralgia 08/20/2020   Vitamin D deficiency 08/20/2020   Cellulitis 12/31/2018   Elephantiasis 12/31/2018   Ischemic chest pain ([place]) 12/31/2018   History of pulmonary embolus (PE) 12/31/2018   Physical deconditioning 08/27/2018   Adenopathy 09/15/2017   Family history of colon cancer 08/01/2017   Taking multiple medications for chronic disease 08/01/2017   Axillary lymphadenopathy 01/26/2017   Urinary incontinence 01/25/2017   Hypokalemia 01/25/2017   Lobar pneumonia ([place]) 01/25/2017   Pressure ulcer of sacral region, stage 2 ([place]) 01/25/2017   Abnormal ECG 01/29/2013   Dyspnea    Menorrhagia with regular cycle 02/22/2011   Anemia 02/22/2011    PCP: Archie Patten, NP  REFERRING PROVIDER: Acquanetta Sit, DPM  REFERRING DIAG: I89.0  THERAPY DIAG:  Lymphedema, not elsewhere classified  ONSET DATE: 2018  SUBJECTIVE:  SUBJECTIVE STATEMENT: [name] is 20 minutes late for her 60 minute OT session for Lymphedema care. Pt was no show/ no call for last visit. She is accompanied by her cousiin, who drove her to clinic today. Pt is barefooted on the left. Pt reports she saw referring provider who is pleased with her progress to date.   PERTINENT HISTORY:  Severe, stage IV, BLE Lymphedema (Elephantiasis [place]). ENV is a complication of sever, chronic, progressive Lymphedema.  HTN, blood clot disorder with PE x 2 in 2018, RA, Hx recurrent cellulitis, severe morbid obesity (body mass index > 40.4 kg/m2).RA, deconditioning, hx pressure ulcer sacrum, hx non-pressure leg wound, hx depression hx chronic fatigue, hx keloids PAIN:  Are you having pain? Yes: NPRS scale: not rated/10 Pain  location: L foot and  leg Pain description: throbbing, pulsating, aching, heavy, full, tight Aggravating factors: sleeping in chair with feet down, walking, standing Relieving factors: exercise, elevation, antibacterial soap, antibiotic powder  PRECAUTIONS: Fall, recurrent infections, hx non-pressure leg wound;    IMPAIRMENTS AND FUNCTIONAL LIMITATIONS: difficulty walking, chronic, progressive limb swelling with associated pain and disfigurement, impaired balance, generalized debility, decreased BLE AROM, decreased strength, impaired endurance  impaired basic and instrumental ADLs ( bathing, dressing, grooming (skin inspection, skin and nail care), impaired functional ambulation, transfers and mobility, impaired home management, shopping, unable to cook and prep food, unable to drive, dependent for wc propulsion, unable to work, impaired social participation, impaired body image  PRIOR LEVEL OF FUNCTION: Requires assistive devices and varying levels of assistance with basic and instrumental ADLs, Needs assistance with functional ambulation, all transfers and functional mobility. Unable to work. Housebound due to impaired mobility and relies on others for transportation  PATIENT GOALS: keep swelling from getting worse so I don't lose my leg   OBJECTIVE:   OBSERVATIONS /ASSESSMENTS:     FOTO (functional outcomes measure): Intake score 03/01/22 44/100%  Lymphedema Life Impact Scale (LLIS) 61.76% (How much lymphedema related problems interfered with your life last week)  BLE COMPARATIVE LIMB VOLUMETRICS:   LANDMARK RIGHT   (TBA)  R LEG (A-D) N/A  R THIGH (E-G) ml  R FULL LIMB (A-G) ml  Limb Volume differential (LVD)  %  Volume change since initial %  Volume change overall V  (Blank rows = not tested)  LANDMARK LEFT  03/14/22  R LEG (A-D) 14499.2 ml  R THIGH (E-G) 17195.4 ml  R FULL LIMB (A-G) 31694.6 ml  Limb Volume differential (LVD)  %  Volume change since initial %  Volume  change overall %  (Blank rows = not tested)     TODAY'S TREATMENT:                                                                                                                                         Pt and family edu re importance of proper skin care to limit her  elevated infection risk. Discussed her elevated risk of cellulitis, how it may be contracted by walking barefooted in the environment, and how it can progress to dangerous systemic septis infection.  PATIENT EDUCATION:  Education details: Discussed importance of regular attendance for optimal clinical outcome. Reviewed Outpatient Rehab attendance policy. Pt and family edu re importance of proper skin care to limit elevated infection risk. Discussed her elevated risk of cellulitis, how it may be contracted by walking barefooted in the environment, and how it can progress to dangerous systemic septis infection. Person educated: Patient and family member Education method: Explanation, Demonstration, Verbal and Tactile cues, and Handouts Education comprehension: verbalized understanding and returned demonstration  HOME EXERCISE PROGRAM: Daily , multilayer compression bandages to one limb at a time only during Intensive Phase CDT. Daily day and HOS  compression garments during Self-management Phase. Daily skin care w low ph lotion Daily lymphatic pumping there ex, 1 set of 10 each element, in order, bilaterally Daily simple self-MLD  ASSESSMENT:  CLINICAL IMPRESSION: Pt arrived too late for manual therapy. Instead Pt/family edu was ephasis of session today. Since Pt missed last visit (no show/no call) we discussed importance of regular attendance for optimal clinical outcome. Reviewed Outpatient Rehab attendance policy, whichg states 2 no show/ no call 2 x in a row, Pt is removed from therapy schedule and a new order is needed to resume treatment. Provided enthusiasyic Pt and family edu re importance of proper skin care to limit  elevated infection risk. Discussed her elevated risk of cellulitis, how it may be contracted by walking barefooted in the environment, and how it can progress to dangerous systemic septis infection. Pt instructed NOT to walk barefooted , especially in the hospital.   Pt continues to wrap her L leg daily and   OBJECTIVE IMPAIRMENTS: Abnormal gait, decreased activity tolerance, decreased balance, decreased endurance, decreased knowledge of condition, decreased knowledge of use of DME, decreased mobility, difficulty walking, decreased ROM, decreased strength, dizziness, hypomobility, increased edema, increased fascial restrictions, impaired flexibility, impaired sensation, postural dysfunction, obesity, pain, and chronic, progressive swelling .   ACTIVITY LIMITATIONS: carrying, lifting, bending, standing, squatting, stairs, transfers, bed mobility, continence, bathing, toileting, dressing, hygiene/grooming, and caring for others  PARTICIPATION LIMITATIONS: meal prep, cleaning, laundry, driving, shopping, community activity, occupation, yard work, school, church, and caring for others  PERSONAL FACTORS: 3+ contributing Co morbidities, limited transportation, consistent, ongoing availability of caregivers, financial limitations are also affecting patient's functional outcome.   REHAB POTENTIAL: Lymphedema management has a high burden of care, especially for Patients who have difficulty, or who are unable to reach their distal legs and feet to apply bandages, compression garments, to bathe, inspect skin and groom nails. In this case because Pt is unable to perform these activities, daily caregiver assistance with all lower extremity lymphedema self-care home program components is essential for achieving clinical success and ongoing self-management. Without consistent, ongoing, daily caregiver assistance for compression bandaging and garments, skin care, and MLD Pt's prognosis is poor. With consistent caregiver  assistance with lymphedema home program Pt's prognosis for improved lymphedema control and reducing infection risk is guarded, at best, due to the advanced stage, multiple contributing co-morbidities, and sedentary lifestyle.  EVALUATION COMPLEXITY: High   GOALS: Goals reviewed with patient? YES  SHORT TERM GOALS: Target date: 6th OT visit   Pt will demonstrate understanding of lymphedema precautions and prevention strategies with modified independence using a printed reference to identify at least 5 precautions and discussing how s/he may implement them  into daily life to reduce risk of progression with modified assistance. Baseline: Max A Goal status: INITIAL  2.   With Max caregiver assistance Pt will be able to apply multilayer, knee length, compression wraps using gradient techniques to decrease limb volume, to limit infection risk, and to limit lymphedema progression.  Baseline: Dependent Goal status: INITIAL  LONG TERM GOALS: Target date: 05/30/22  Given this patient's Intake score of 44/100% on the functional outcomes FOTO tool, patient will experience an increase in function of 3  points to improve basic and instrumental ADLs performance, including lymphedema self-care. Baseline: 44/100 Goal status: INITIAL  2.  Given this patient's Intake score of TBA % on the Lymphedema Life Impact Scale (LLIS), patient will experience a reduction of at least 5% in her perceived level of functional impairment resulting from lymphedema to improve functional performance and quality of life (QOL). Baseline: TBA Goal status: INITIAL  3.  With max caregiver assistance Pt will be able to don and doff appropriate compression garments and/or devices to control BLE lymphedema and to limit progression.  Baseline: Dependent Goal status: INITIAL  4.  With tolerance building strategies over time  Pt will be able to tolerate compression bandages and garments for at least 24 hours  to control BLE lymphedema  and to limit progression.  Baseline: Max A Goal status: INITIAL  5.  With Max caregiver assistance Pt will achieve and sustain no less than 85% compliance with all LE self-care home program components throughout CDT, including modified simple self-MLD, daily skin care and inspection, lymphatic pumping the ex and appropriate compression to limit lymphedema progression and to limit further functional decline. Baseline: Dependent Goal status: INITIAL  6.  Pt will diligently perform skin care regime prescribed by her doctor to eliminate fungal infection and prevent reoccurrence for 90 days Baseline: Dependent Goal status: INITIAL   OT PLAN OF CARE:  PT FREQUENCY: 2x/week  PT DURATION: other: 12 weeks and PRN  PLANNED INTERVENTIONS: Therapeutic exercises, Therapeutic activity, Patient/Family education, Self Care, DME instructions, Manual lymph drainage, Compression bandaging, Vasopneumatic device, Manual therapy, and skin care  PLAN FOR NEXT SESSION:  Skin care MLD Multilayer wraps Pt/ family edu  Andrey Spearman, Lauren, OTR/L, CLT-LANA 05/01/22 1:52 PM

## 2022-05-03 ENCOUNTER — Ambulatory Visit
Admission: RE | Admit: 2022-05-03 | Discharge: 2022-05-03 | Disposition: A | Discharge status: Home / Self Care | Payer: Medicare HMO | Source: Ambulatory Visit | Attending: Nurse Practitioner | Admitting: Nurse Practitioner

## 2022-05-03 DIAGNOSIS — Z1231 Encounter for screening mammogram for malignant neoplasm of breast: Secondary | ICD-10-CM

## 2022-05-04 ENCOUNTER — Encounter: Payer: Self-pay | Admitting: Occupational Therapy

## 2022-05-04 ENCOUNTER — Ambulatory Visit: Payer: Medicare HMO | Admitting: Occupational Therapy

## 2022-05-04 DIAGNOSIS — I890 Lymphedema, not elsewhere classified: Secondary | ICD-10-CM | POA: Diagnosis not present

## 2022-05-05 ENCOUNTER — Encounter: Payer: Self-pay | Admitting: Occupational Therapy

## 2022-05-05 NOTE — Therapy (Signed)
OUTPATIENT OCCUPATIONAL THERAPY TREATMENT NOTE BILATERAL  LOWER EXTREMITY LYMPHEDEMA  Patient Name: [name] MRN: [id number] DOB:12/14/1965, 57 y.o., female Today's Date: 05/05/2022  END OF SESSION:  OT End of Session - 05/04/22 1412     Visit Number 9    Number of Visits 36    Date for OT Re-Evaluation 05/30/22    OT Start Time 0200    OT Stop Time 0300    OT Time Calculation (min) 60 min    Activity Tolerance Patient tolerated treatment well;No increased pain    Behavior During Therapy WFL for tasks assessed/performed              Past Medical History:  Diagnosis Date   Anemia    Anxiety    Arthritis    Asthma    hx as child - no prob as adult - no inhaler   Blood transfusion 01/22/11   transfusion 2 units at [facility]   Depression 08/2010   psych assessment   Dyspnea    Fibroid    Headache(784.0)    rx for imitrex - last one jan   Keloid    Lymphedema    Pulmonary embolism ([place])    Past Surgical History:  Procedure Laterality Date   ABDOMINAL HYSTERECTOMY     COLONOSCOPY N/A 11/08/2017   Procedure: COLONOSCOPY;  Surgeon: Danie Binder, MD;  [facility]: [facility];  Service: Endoscopy;  Laterality: N/A;  2:00pm   FLEXIBLE SIGMOIDOSCOPY N/A 09/17/2017   Procedure: FLEXIBLE SIGMOIDOSCOPY;  Surgeon: Danie Binder, MD;  Location: [facility];  Service: Endoscopy;  Laterality: N/A;   HERNIA REPAIR  [id number]   umbilical   POLYPECTOMY  11/08/2017   Procedure: POLYPECTOMY;  Surgeon: Danie Binder, MD;  Location: [facility];  Service: Endoscopy;;  ascending colon (CSx1), transverse colon (CS x1), splenic flexure (HSx1)   SVD     Spontaneous vaginal delivery; x 1   Patient Active Problem List   Diagnosis Date Noted   Rheumatoid arthritis ([place]) 03/06/2022   Lymphedema 03/06/2022   Morbid obesity ([place]) 09/14/2021   Chest pain 04/07/2021   Screening for malignant neoplasm of colon 03/09/2021   Constipation 03/09/2021   Iron deficiency anemia 03/09/2021    Body mass index (BMI) 45.0-49.9, adult ([place]) 08/20/2020   Chronic fatigue syndrome 08/20/2020   Polyarthralgia 08/20/2020   Vitamin D deficiency 08/20/2020   Cellulitis 12/31/2018   Elephantiasis 12/31/2018   Ischemic chest pain ([place]) 12/31/2018   History of pulmonary embolus (PE) 12/31/2018   Physical deconditioning 08/27/2018   Adenopathy 09/15/2017   Family history of colon cancer 08/01/2017   Taking multiple medications for chronic disease 08/01/2017   Axillary lymphadenopathy 01/26/2017   Urinary incontinence 01/25/2017   Hypokalemia 01/25/2017   Lobar pneumonia ([place]) 01/25/2017   Pressure ulcer of sacral region, stage 2 ([place]) 01/25/2017   Abnormal ECG 01/29/2013   Dyspnea    Menorrhagia with regular cycle 02/22/2011   Anemia 02/22/2011    PCP: Archie Patten, NP  REFERRING PROVIDER: Acquanetta Sit, DPM  REFERRING DIAG: I89.0  THERAPY DIAG:  Lymphedema, not elsewhere classified  ONSET DATE: 2018  SUBJECTIVE:  SUBJECTIVE STATEMENT: [name] arrives with compression wraps in place. She is accompanied by a cousin who drove her to her appointment. Pt states she will miss 2 visits next week due to a family funeral. Pt has no new complaints. LE-related leg pain is unchanged. She does not rate it numerically.  PERTINENT HISTORY:  Severe, stage IV, BLE Lymphedema (Elephantiasis [place]). ENV is a complication of sever, chronic, progressive Lymphedema.  HTN, blood clot disorder with PE x 2 in 2018, RA, Hx recurrent cellulitis, severe morbid obesity (body mass index > 40.4 kg/m2).RA, deconditioning, hx pressure ulcer sacrum, hx non-pressure leg wound, hx depression hx chronic fatigue, hx keloids PAIN:  Are you having pain? Yes: NPRS scale: not rated/10 Pain location: L foot and   leg Pain description: throbbing, pulsating, aching, heavy, full, tight Aggravating factors: sleeping in chair with feet down, walking, standing Relieving factors: exercise, elevation, antibacterial soap, antibiotic powder  PRECAUTIONS: Fall, recurrent infections, hx non-pressure leg wound;    IMPAIRMENTS AND FUNCTIONAL LIMITATIONS: difficulty walking, chronic, progressive limb swelling with associated pain and disfigurement, impaired balance, generalized debility, decreased BLE AROM, decreased strength, impaired endurance  impaired basic and instrumental ADLs ( bathing, dressing, grooming (skin inspection, skin and nail care), impaired functional ambulation, transfers and mobility, impaired home management, shopping, unable to cook and prep food, unable to drive, dependent for wc propulsion, unable to work, impaired social participation, impaired body image  PRIOR LEVEL OF FUNCTION: Requires assistive devices and varying levels of assistance with basic and instrumental ADLs, Needs assistance with functional ambulation, all transfers and functional mobility. Unable to work. Housebound due to impaired mobility and relies on others for transportation  PATIENT GOALS: keep swelling from getting worse so I don't lose my leg   OBJECTIVE:   OBSERVATIONS /ASSESSMENTS:     FOTO (functional outcomes measure): Intake score 03/01/22 44/100%  Lymphedema Life Impact Scale (LLIS) 61.76% (How much lymphedema related problems interfered with your life last week)  BLE COMPARATIVE LIMB VOLUMETRICS:   LANDMARK RIGHT   (TBA)  R LEG (A-D) N/A  R THIGH (E-G) ml  R FULL LIMB (A-G) ml  Limb Volume differential (LVD)  %  Volume change since initial %  Volume change overall V  (Blank rows = not tested)  LANDMARK LEFT  03/14/22  R LEG (A-D) 14499.2 ml  R THIGH (E-G) 17195.4 ml  R FULL LIMB (A-G) 31694.6 ml  Limb Volume differential (LVD)  %  Volume change since initial %  Volume change overall %  (Blank  rows = not tested)     TODAY'S TREATMENT:                                                                                                                                         LLE/LLQ MLD utilizing short neck sequence, deep abdominal lymphatics and functional inguinal LN. Skin care with low ph castor oil  simultaneously during MLD to improve skin excursion and limit infection risk Multilayer knee length compression wraps to L Leg as established.  PATIENT EDUCATION:  Continued Pt/ CG edu for lymphedema self care home program throughout session. Topics include outcome of comparative limb volumetrics- starting limb volume differentials (LVDs), technology and gradient techniques used for short stretch, multilayer compression wrapping, simple self-MLD, therapeutic lymphatic pumping exercises, skin/nail care, LE precautions,. compression garment recommendations and specifications, wear and care schedule and compression garment donning / doffing w assistive devices. Discussed progress towards all OT goals since commencing CDT. All questions answered to the Pt's satisfaction.  Person educated: Patient and family member Education method: Explanation, Demonstration, Verbal and Tactile cues, and Handouts Education comprehension: verbalized understanding and returned demonstration  HOME EXERCISE PROGRAM: Daily , multilayer compression bandages to one limb at a time only during Intensive Phase CDT. Daily day and HOS  compression garments during Self-management Phase. Daily skin care w low ph lotion Daily lymphatic pumping there ex, 1 set of 10 each element, in order, bilaterally Daily simple self-MLD  ASSESSMENT:  CLINICAL IMPRESSION: L leg swelling mildly reduced since last visit. Skin condition unchanged.LE associated skin changes are so advanced it is difficult to predict level of possible improvement. Provided MLD without increased pain. Skin excursion is very limited 2/2 dense fibrosis. Wound is  unchanged, but odor is decreased today. Applied knee length wraps with foam pad on bottom of foot for added protection. Cont as per POC.  Pt continues to wrap her L leg daily and   OBJECTIVE IMPAIRMENTS: Abnormal gait, decreased activity tolerance, decreased balance, decreased endurance, decreased knowledge of condition, decreased knowledge of use of DME, decreased mobility, difficulty walking, decreased ROM, decreased strength, dizziness, hypomobility, increased edema, increased fascial restrictions, impaired flexibility, impaired sensation, postural dysfunction, obesity, pain, and chronic, progressive swelling .   ACTIVITY LIMITATIONS: carrying, lifting, bending, standing, squatting, stairs, transfers, bed mobility, continence, bathing, toileting, dressing, hygiene/grooming, and caring for others  PARTICIPATION LIMITATIONS: meal prep, cleaning, laundry, driving, shopping, community activity, occupation, yard work, school, church, and caring for others  PERSONAL FACTORS: 3+ contributing Co morbidities, limited transportation, consistent, ongoing availability of caregivers, financial limitations are also affecting patient's functional outcome.   REHAB POTENTIAL: Lymphedema management has a high burden of care, especially for Patients who have difficulty, or who are unable to reach their distal legs and feet to apply bandages, compression garments, to bathe, inspect skin and groom nails. In this case because Pt is unable to perform these activities, daily caregiver assistance with all lower extremity lymphedema self-care home program components is essential for achieving clinical success and ongoing self-management. Without consistent, ongoing, daily caregiver assistance for compression bandaging and garments, skin care, and MLD Pt's prognosis is poor. With consistent caregiver assistance with lymphedema home program Pt's prognosis for improved lymphedema control and reducing infection risk is guarded, at  best, due to the advanced stage, multiple contributing co-morbidities, and sedentary lifestyle.  EVALUATION COMPLEXITY: High   GOALS: Goals reviewed with patient? YES  SHORT TERM GOALS: Target date: 6th OT visit   Pt will demonstrate understanding of lymphedema precautions and prevention strategies with modified independence using a printed reference to identify at least 5 precautions and discussing how s/he may implement them into daily life to reduce risk of progression with modified assistance. Baseline: Max A Goal status: INITIAL  2.   With Max caregiver assistance Pt will be able to apply multilayer, knee length, compression wraps using gradient techniques to decrease limb volume,  to limit infection risk, and to limit lymphedema progression.  Baseline: Dependent Goal status: INITIAL  LONG TERM GOALS: Target date: 05/30/22  Given this patient's Intake score of 44/100% on the functional outcomes FOTO tool, patient will experience an increase in function of 3  points to improve basic and instrumental ADLs performance, including lymphedema self-care. Baseline: 44/100 Goal status: INITIAL  2.  Given this patient's Intake score of TBA % on the Lymphedema Life Impact Scale (LLIS), patient will experience a reduction of at least 5% in her perceived level of functional impairment resulting from lymphedema to improve functional performance and quality of life (QOL). Baseline: TBA Goal status: INITIAL  3.  With max caregiver assistance Pt will be able to don and doff appropriate compression garments and/or devices to control BLE lymphedema and to limit progression.  Baseline: Dependent Goal status: INITIAL  4.  With tolerance building strategies over time  Pt will be able to tolerate compression bandages and garments for at least 24 hours  to control BLE lymphedema and to limit progression.  Baseline: Max A Goal status: INITIAL  5.  With Max caregiver assistance Pt will achieve and  sustain no less than 85% compliance with all LE self-care home program components throughout CDT, including modified simple self-MLD, daily skin care and inspection, lymphatic pumping the ex and appropriate compression to limit lymphedema progression and to limit further functional decline. Baseline: Dependent Goal status: INITIAL  6.  Pt will diligently perform skin care regime prescribed by her doctor to eliminate fungal infection and prevent reoccurrence for 90 days Baseline: Dependent Goal status: INITIAL   OT PLAN OF CARE:  PT FREQUENCY: 2x/week  PT DURATION: other: 12 weeks and PRN  PLANNED INTERVENTIONS: Therapeutic exercises, Therapeutic activity, Patient/Family education, Self Care, DME instructions, Manual lymph drainage, Compression bandaging, Vasopneumatic device, Manual therapy, and skin care  PLAN FOR NEXT SESSION:  Skin care MLD Multilayer wraps Pt/ family edu  Andrey Spearman, Lauren, OTR/L, CLT-LANA 05/05/22 12:14 PM

## 2022-05-09 ENCOUNTER — Encounter (HOSPITAL_BASED_OUTPATIENT_CLINIC_OR_DEPARTMENT_OTHER): Payer: Medicare HMO | Admitting: Internal Medicine

## 2022-05-09 ENCOUNTER — Ambulatory Visit: Payer: Medicare HMO | Admitting: Occupational Therapy

## 2022-05-09 DIAGNOSIS — I890 Lymphedema, not elsewhere classified: Secondary | ICD-10-CM | POA: Diagnosis not present

## 2022-05-09 NOTE — Therapy (Signed)
OUTPATIENT OCCUPATIONAL THERAPY TREATMENT NOTE & PROGRESS REPORT  BILATERAL  LOWER EXTREMITY LYMPHEDEMA  Patient Name: ADELA NILSSON MRN: [id number] DOB:24-Jun-1965, 57 y.o., female Today's Date: 05/10/2022  REPORTING PERIOD: 1/24 - 05/09/22  END OF SESSION:  OT End of Session - 05/09/22 1420     Visit Number 10    Number of Visits 36    Date for OT Re-Evaluation 05/30/22    OT Start Time 0210    OT Stop Time 0310    OT Time Calculation (min) 60 min    Activity Tolerance Patient tolerated treatment well;No increased pain    Behavior During Therapy WFL for tasks assessed/performed              Past Medical History:  Diagnosis Date   Anemia    Anxiety    Arthritis    Asthma    hx as child - no prob as adult - no inhaler   Blood transfusion 01/22/11   transfusion 2 units at [facility]   Depression 08/2010   psych assessment   Dyspnea    Fibroid    Headache(784.0)    rx for imitrex - last one jan   Keloid    Lymphedema    Pulmonary embolism ([place])    Past Surgical History:  Procedure Laterality Date   ABDOMINAL HYSTERECTOMY     COLONOSCOPY N/A 11/08/2017   Procedure: COLONOSCOPY;  Surgeon: Danie Binder, MD;  [facility];  Service: Endoscopy;  Laterality: N/A;  2:00pm   FLEXIBLE SIGMOIDOSCOPY N/A 09/17/2017   Procedure: FLEXIBLE SIGMOIDOSCOPY;  Surgeon: Danie Binder, MD;  Location: [facility];  Service: Endoscopy;  Laterality: N/A;   HERNIA REPAIR  [id number]   umbilical   POLYPECTOMY  11/08/2017   Procedure: POLYPECTOMY;  Surgeon: Danie Binder, MD;  Location: [facility];  Service: Endoscopy;;  ascending colon (CSx1), transverse colon (CS x1), splenic flexure (HSx1)   SVD     Spontaneous vaginal delivery; x 1   Patient Active Problem List   Diagnosis Date Noted   Rheumatoid arthritis ([place]) 03/06/2022   Lymphedema 03/06/2022   Morbid obesity ([place]) 09/14/2021   Chest pain 04/07/2021   Screening for malignant neoplasm of colon 03/09/2021    Constipation 03/09/2021   Iron deficiency anemia 03/09/2021   Body mass index (BMI) 45.0-49.9, adult ([place]) 08/20/2020   Chronic fatigue syndrome 08/20/2020   Polyarthralgia 08/20/2020   Vitamin D deficiency 08/20/2020   Cellulitis 12/31/2018   Elephantiasis 12/31/2018   Ischemic chest pain ([place]) 12/31/2018   History of pulmonary embolus (PE) 12/31/2018   Physical deconditioning 08/27/2018   Adenopathy 09/15/2017   Family history of colon cancer 08/01/2017   Taking multiple medications for chronic disease 08/01/2017   Axillary lymphadenopathy 01/26/2017   Urinary incontinence 01/25/2017   Hypokalemia 01/25/2017   Lobar pneumonia ([place]) 01/25/2017   Pressure ulcer of sacral region, stage 2 ([place]) 01/25/2017   Abnormal ECG 01/29/2013   Dyspnea    Menorrhagia with regular cycle 02/22/2011   Anemia 02/22/2011    PCP: Archie Patten, NP  REFERRING PROVIDER: Acquanetta Sit, DPM  REFERRING DIAG: I89.0  THERAPY DIAG:  Lymphedema, not elsewhere classified  ONSET DATE: 2018  SUBJECTIVE:  SUBJECTIVE STATEMENT: Ms Arana presents to OT for Intensive Phase CDT to L leg and foot. She arrives with compression wraps in place. Pt reports LE-related pain, is 3/10, "including itching primarily". Pt has no new complaints. She agrees to repeating comparative limb volumetrics today for progress report.   PERTINENT HISTORY:  Severe, stage IV, BLE Lymphedema (Elephantiasis [place]). ENV is a complication of sever, chronic, progressive Lymphedema.  HTN, blood clot disorder with PE x 2 in 2018, RA, Hx recurrent cellulitis, severe morbid obesity (body mass index > 40.4 kg/m2).RA, deconditioning, hx pressure ulcer sacrum, hx non-pressure leg wound, hx depression hx chronic fatigue, hx keloids PAIN:  Are you  having pain? : NPRS scale: not rated/10 Pain location: L foot and  leg Pain description: throbbing, pulsating, aching, heavy, full, tight Aggravating factors: sleeping in chair with feet down, walking, standing Relieving factors: exercise, elevation, antibacterial soap, antibiotic powder  PRECAUTIONS: Fall, recurrent infections, hx non-pressure leg wound;    IMPAIRMENTS AND FUNCTIONAL LIMITATIONS: difficulty walking, chronic, progressive limb swelling with associated pain and disfigurement, impaired balance, generalized debility, decreased BLE AROM, decreased strength, impaired endurance  impaired basic and instrumental ADLs ( bathing, dressing, grooming (skin inspection, skin and nail care), impaired functional ambulation, transfers and mobility, impaired home management, shopping, unable to cook and prep food, unable to drive, dependent for wc propulsion, unable to work, impaired social participation, impaired body image  PRIOR LEVEL OF FUNCTION: Requires assistive devices and varying levels of assistance with basic and instrumental ADLs, Needs assistance with functional ambulation, all transfers and functional mobility. Unable to work. Housebound due to impaired mobility and relies on others for transportation  PATIENT GOALS: keep swelling from getting worse so I don't lose my leg   OBJECTIVE:   OBSERVATIONS /ASSESSMENTS:     FOTO (functional outcomes measure): Intake score 03/01/22 44/100%  Lymphedema Life Impact Scale (LLIS) 61.76% (How much lymphedema related problems interfered with your life last week)  BLE COMPARATIVE LIMB VOLUMETRICS:   LANDMARK LEFT  03/14/22 INITIAL   R LEG (A-D) 14499.2 ml  R THIGH (E-G) 17195.4 ml  R FULL LIMB (A-G) 31694.6 ml  Limb Volume differential (LVD)  %  Volume change since initial %  Volume change overall %  (Blank rows = not tested)   LANDMARK LEFT  05/09/22 10 Rx Visit  L LEG (A-D) 15607.0 ml  L THIGH (E-G)  21709.3 ml  L FULL LIMB (A-G)  37316.4 ml  Limb Volume differential (LVD)  %  Volume change since initial R Leg (A-D) Increased in volume by 7.6% L thigh (E-G) INC by 26.25% L Full limb (E-G) is INC by 17.7%.   Volume change overall %        TODAY'S TREATMENT:  LLE/LLQ MLD utilizing short neck sequence, deep abdominal lymphatics and functional inguinal LN. Skin care with low ph castor oil simultaneously during MLD to improve skin excursion and limit infection risk Multilayer knee length compression wraps to L Leg as established. PATIENT EDUCATION:  Continued Pt/ CG edu for lymphedema self care home program throughout session. Topics include outcome of comparative limb volumetrics- starting limb volume differentials (LVDs), technology and gradient techniques used for short stretch, multilayer compression wrapping, simple self-MLD, therapeutic lymphatic pumping exercises, skin/nail care, LE precautions,. compression garment recommendations and specifications, wear and care schedule and compression garment donning / doffing w assistive devices. Discussed progress towards all OT goals since commencing CDT. All questions answered to the Pt's satisfaction.  Person educated: Patient and family member Education method: Explanation, Demonstration, Verbal and Tactile cues, and Handouts Education comprehension: verbalized understanding and returned demonstration  HOME EXERCISE PROGRAM: Daily , multilayer compression bandages to one limb at a time only during Intensive Phase CDT. Daily day and HOS  compression garments during Self-management Phase. Daily skin care w low ph lotion Daily lymphatic pumping there ex, 1 set of 10 each element, in order, bilaterally Daily simple self-MLD  Lymphedema Self- Care Instructions   1. EXERCISE: Perform lymphatic pumping there ex at least 2 x a day while  wearing your compression wraps or garments. Perform 10 reps of each exercise bilaterally and be sure to perform them in order. Don't skip around!  OMIT PARTIAL SIT UPs.  2. MLD: Perform simple self-manual lymphatic drainage (MLD) at least once a day as directed. Take your time! Breathe! ;-)  3. If you have a Flexitouch advanced "pump" use it 1 time each day on a single limb only. The Flexitouch moves lymphatic fluid out of your affected body part and back to your heart, so DO NOT use the Flexi on 2 legs at a time, and DO NOT cues it on 2 legs on the same day. If you experience any atypical shortness of breath, sudden onset of pain, or feelings of heart arhythmia, or racing, discontinue use of the Flexitouch and report these symptoms to your doctor right away. Also, discontinue Flexi if you have an infection or a fever. It's OK to resume using the device 72 hours AFTER your first dose of oral antibiotic.   4. 4. WRAPS: Compression wraps are to be worn 23 hrs/ 7 days/wk during Intensive Phase of Complete Decongestive Therapy (CDT).Building tolerance may take time and practice, so don't get discouraged. If bandages begin to feel tight during periods of inactivity and/or during the night, try performing your exercises to loosen them. Do not leave short stretch wraps in place for > 23 hours. It is very important that you remove all wraps daily to inspect skin, bathe and perform skin care before reapplying your wraps.  5. Daytime GARMENTS/ HOS DEVICES: During the Self-Management Phase CDT your compression garments are to be worn during waking hours when active. Do NOT sleep in your garments!!  Don daytime garments first thing in the morning. Do not wear your HOS devices all day instead of garments. These will not contain your swelling.  6. PUT YOUR FEET UP! Elevate your feet and legs and feet to the level of your heart whenever you are sitting down.   7. SKIN: Carefully monitor skin condition and perform  impeccable hygiene daily. Bathe skin with mild soap and water and apply low pH lotion (aka Eucerin ) to improve hydration and limit infection risk.   ASSESSMENT: CLINICAL  IMPRESSION: LLE comparative limb volumetrics disappointingly reveal increases for all LLE segments, despite undergoing conservative OT for CDT. The L Leg (A-D)  is increased in volume by 7.6% since initially measured on 03/14/22. The L thigh (E-G) INC by 26.25%, and L full limb volume is increased by 17.7%.These increases are quite substantial and represent a notable setback in swelling control. Oddly by visual assessment the limb appears smaller  below the knee. Skin condition continues to improve as lymphatic cysts are flattened and fluid  returned to circulation, Dried chunks of skin are sloughing off when limb volume reduces in bandages. Weeping area is becoming larger with maceration and intermittent malodorous condition.  "Weeping" of L mid calf area appears to 2/2 lymphatic fistula, but sent Pt for wound consult to ensure infection is not present, and if so is treated to limit infection risk. Initially consistent attendance at OT visits and tardiness necessitating rescheduling for LE care was a significant obstacle to progress, but those circumstances have improved and Pt arrives nearly on time and has not cancelled, or no showed in sometime. Trial of Flexitouch advanced sequential pneumatic compression device, or "pump" is pending scheduling. Cont as per POC.  Pt continues to wrap her L leg daily and   OBJECTIVE IMPAIRMENTS: Abnormal gait, decreased activity tolerance, decreased balance, decreased endurance, decreased knowledge of condition, decreased knowledge of use of DME, decreased mobility, difficulty walking, decreased ROM, decreased strength, dizziness, hypomobility, increased edema, increased fascial restrictions, impaired flexibility, impaired sensation, postural dysfunction, obesity, pain, and chronic, progressive  swelling .   ACTIVITY LIMITATIONS: carrying, lifting, bending, standing, squatting, stairs, transfers, bed mobility, continence, bathing, toileting, dressing, hygiene/grooming, and caring for others  PARTICIPATION LIMITATIONS: meal prep, cleaning, laundry, driving, shopping, community activity, occupation, yard work, school, church, and caring for others  PERSONAL FACTORS: 3+ contributing Co morbidities, limited transportation, consistent, ongoing availability of caregivers, financial limitations are also affecting patient's functional outcome.   REHAB POTENTIAL: Lymphedema management has a high burden of care, especially for Patients who have difficulty, or who are unable to reach their distal legs and feet to apply bandages, compression garments, to bathe, inspect skin and groom nails. In this case because Pt is unable to perform these activities, daily caregiver assistance with all lower extremity lymphedema self-care home program components is essential for achieving clinical success and ongoing self-management. Without consistent, ongoing, daily caregiver assistance for compression bandaging and garments, skin care, and MLD Pt's prognosis is poor. With consistent caregiver assistance with lymphedema home program Pt's prognosis for improved lymphedema control and reducing infection risk is guarded, at best, due to the advanced stage, multiple contributing co-morbidities, and sedentary lifestyle.  EVALUATION COMPLEXITY: High   GOALS: Goals reviewed with patient? YES  SHORT TERM GOALS: Target date: 6th OT visit   Pt will demonstrate understanding of lymphedema precautions and prevention strategies with modified independence using a printed reference to identify at least 5 precautions and discussing how s/he may implement them into daily life to reduce risk of progression with modified assistance. Baseline: Max A Goal status:05/08/22 ONGOING  2.   With Max caregiver assistance Pt will be able  to apply multilayer, knee length, compression wraps using gradient techniques to decrease limb volume, to limit infection risk, and to limit lymphedema progression.  Baseline: Dependent Goal status: 05/08/22 GOAL MET  LONG TERM GOALS: Target date: 05/30/22  Given this patient's Intake score of 44/100% on the functional outcomes FOTO tool, patient will experience an increase in function of 3  points  to improve basic and instrumental ADLs performance, including lymphedema self-care. Baseline: 44/100 Goal status: 05/08/22 ONGOING  2.  Given this patient's Intake score of TBA % on the Lymphedema Life Impact Scale (LLIS), patient will experience a reduction of at least 5% in her perceived level of functional impairment resulting from lymphedema to improve functional performance and quality of life (QOL). Baseline: TBA Goal status: 05/08/22 ONGOING  3.  With max caregiver assistance Pt will be able to don and doff appropriate compression garments and/or devices to control BLE lymphedema and to limit progression.  Baseline: Dependent Goal status: 05/08/22 ONGOING. Limb volume not reduced enough to date to consider garment measurement and fitting.  4.  With tolerance building strategies over time  Pt will be able to tolerate compression bandages and garments for at least 24 hours  to control BLE lymphedema and to limit progression.  Baseline: Max A Goal status: 05/08/22 GOAL MET for Bandages. ONGOING for garments.  5.  With Max caregiver assistance Pt will achieve and sustain no less than 85% compliance with all LE self-care home program components throughout CDT, including modified simple self-MLD, daily skin care and inspection, lymphatic pumping the ex and appropriate compression to limit lymphedema progression and to limit further functional decline. Baseline: Dependent Goal status: 05/08/22 ONGOING  6.  Pt will diligently perform skin care regime prescribed by her doctor to eliminate fungal infection  and prevent reoccurrence for 90 days Baseline: Dependent Goal status: 05/08/22 ONGOING   OT PLAN OF CARE:  PT FREQUENCY: 2x/week  PT DURATION: other: 12 weeks and PRN  . 05/08/22 Due to the severity of this case and double limb involvement it is expected that OT for CDT will quite a bit longer THAT INITIAL 12 WEEK POC  PLANNED INTERVENTIONS: Therapeutic exercises, Therapeutic activity, Patient/Family education, Self Care, DME instructions, Manual lymph drainage, Compression bandaging, Vasopneumatic device, Manual therapy, and skin care  PLAN FOR NEXT SESSION:  Skin care MLD Multilayer wraps Pt/ family edu  Andrey Spearman, MS, OTR/L, CLT-LANA 05/10/22 11:53 AM

## 2022-05-10 ENCOUNTER — Encounter (HOSPITAL_BASED_OUTPATIENT_CLINIC_OR_DEPARTMENT_OTHER): Payer: Medicare HMO | Attending: Internal Medicine | Admitting: Internal Medicine

## 2022-05-10 ENCOUNTER — Encounter: Payer: Self-pay | Admitting: Occupational Therapy

## 2022-05-10 DIAGNOSIS — I890 Lymphedema, not elsewhere classified: Secondary | ICD-10-CM | POA: Diagnosis not present

## 2022-05-10 DIAGNOSIS — L97822 Non-pressure chronic ulcer of other part of left lower leg with fat layer exposed: Secondary | ICD-10-CM | POA: Diagnosis not present

## 2022-05-11 ENCOUNTER — Ambulatory Visit: Payer: Medicare HMO | Admitting: Occupational Therapy

## 2022-05-11 NOTE — Progress Notes (Signed)
[name], [name] ([id number]) [phone].pdf Page 1 of 4 Visit Report for 05/10/2022 Abuse Risk Screen Details Patient Name: Date of Service: [name], [name] 05/10/2022 12:45 PM Medical Record Number: [id number] Patient Account Number: [id number] Date of Birth/Sex: Treating RN: [birth year] (57 y.o. [name] Primary Care Raelynn Corron: [name] Other Clinician: Referring Jameon Deller: Treating Evaleen Sant/Extender: Mack Hook Weeks in Treatment: 0 Abuse Risk Screen Items Answer ABUSE RISK SCREEN: Has anyone close to you tried to hurt or harm you recentlyo No Do you feel uncomfortable with anyone in your familyo No Has anyone forced you do things that you didnt want to doo No Electronic Signature(s) Signed: 05/10/2022 5:26:11 PM By: Deon Pilling RN, BSN Entered By: Deon Pilling on 05/10/2022 13:22:55 -------------------------------------------------------------------------------- Activities of Daily Living Details Patient Name: Date of Service: [name], [name] 05/10/2022 12:45 PM Medical Record Number: [id number] Patient Account Number: [id number] Date of Birth/Sex: Treating RN: 1965/08/27 (57 y.o. [name] Primary Care Shalla Bulluck: [name] Other Clinician: Referring Ewelina Naves: Treating Jyren Cerasoli/Extender: Mack Hook Weeks in Treatment: 0 Activities of Daily Living Items Answer Activities of Daily Living (Please select one for each item) Drive Automobile Not Able T Medications ake Completely Able Use T elephone Completely Able Care for Appearance Completely Able Use T oilet Completely Able Bath / Shower Completely Able Dress Self Completely Able Feed Self Completely Able Walk Completely Able Get In / Out Bed Completely Able Housework Completely Able Prepare Meals Completely [place] Completely Able Shop for Self Completely Able Electronic Signature(s) Signed: 05/10/2022  5:26:11 PM By: Deon Pilling RN, BSN Entered By: Deon Pilling on 05/10/2022 13:23:12 -------------------------------------------------------------------------------- Education Screening Details Patient Name: Date of Service: [name], [name] 05/10/2022 12:45 PM Medical Record Number: [id number] Patient Account Number: [id number] Date of Birth/Sex: Treating RN: [birth year] (57 y.o. [name] Primary Care Shirley Decamp: [name] Other Clinician: Referring Joeanna Howdyshell: Treating Estefany Goebel/Extender: Lamount Cranker in Treatment: [address] TRISTACA, DADDIO ([id number]) [phone].pdf Page 2 of 4 Primary Learner Assessed: Patient Learning Preferences/Education Level/Primary Language Learning Preference: Explanation, Demonstration, Printed Material Highest Education Level: College or Above Preferred Language: Diplomatic Services operational officer Language Barrier: No Translator Needed: No Memory Deficit: No Emotional Barrier: No Cultural/Religious Beliefs Affecting Medical Care: No Physical Barrier Impaired Vision: No Impaired Hearing: No Decreased Hand dexterity: No Knowledge/Comprehension Knowledge Level: High Comprehension Level: High Ability to understand written instructions: High Ability to understand verbal instructions: High Motivation Anxiety Level: Calm Cooperation: Cooperative Education Importance: Acknowledges Need Interest in Health Problems: Asks Questions Perception: Coherent Willingness to Engage in Self-Management High Activities: Readiness to Engage in Self-Management High Activities: Electronic Signature(s) Signed: 05/10/2022 5:26:11 PM By: Deon Pilling RN, BSN Entered By: Deon Pilling on 05/10/2022 13:23:33 -------------------------------------------------------------------------------- Fall Risk Assessment Details Patient Name: Date of Service: [name], [name] 05/10/2022 12:45 PM Medical Record Number:  [id number] Patient Account Number: [id number] Date of Birth/Sex: Treating RN: 24-Jul-1965 (57 y.o. [name], Tammi Klippel Primary Care Harvest Deist: [name] Other Clinician: Referring Shawntee Mainwaring: Treating Shahir Karen/Extender: Mack Hook Weeks in Treatment: 0 Fall Risk Assessment Items Have you had 2 or more falls in the last 12 monthso 0 No Have you had any fall that resulted in injury in the last 12 monthso 0 No FALLS RISK SCREEN History of falling - immediate or within 3 months 0 No Secondary diagnosis (Do you have 2 or more medical diagnoseso) 0 No Ambulatory aid None/bed rest/wheelchair/nurse 0 No Crutches/cane/walker 15 Yes Furniture 0 No Intravenous therapy Access/Saline/Heparin [employer]  0 No Gait/Transferring Normal/ bed rest/ wheelchair 0 No Weak (short steps with or without shuffle, stooped but able to lift head while walking, may seek 10 Yes support from furniture) Impaired (short steps with shuffle, may have difficulty arising from chair, head down, impaired 0 No balance) Mental Status Oriented to own ability 0 Yes Overestimates or forgets limitations 0 No Risk Level: Medium Risk Score: 25 ANNIE, ELLIN E ([id number]) [id number] Page 3 of 4 Electronic Signature(s) -------------------------------------------------------------------------------- Foot Assessment Details Patient Name: Date of Service: [name], [name] 05/10/2022 12:45 PM Medical Record Number: [id number] Patient Account Number: [id number] Date of Birth/Sex: Treating RN: Apr 05, 1965 (57 y.o. [name] Primary Care Aubrianna Orchard: [name] Other Clinician: Referring Hatice Bubel: Treating Alyzae Hawkey/Extender: Mack Hook Weeks in Treatment: 0 Foot Assessment Items Site Locations + = Sensation present, - = Sensation absent, C = Callus, U = Ulcer R = Redness, W = Warmth, M = Maceration, PU = Pre-ulcerative lesion F = Fissure, S =  Swelling, D = Dryness Assessment Right: Left: Other Deformity: No No Prior Foot Ulcer: No No Prior Amputation: No No Charcot Joint: No No Ambulatory Status: Ambulatory With Help Assistance Device: Walker GaitEnergy manager) Signed: 05/10/2022 5:26:11 PM By: Deon Pilling RN, BSN Entered By: Deon Pilling on 05/10/2022 13:25:14 -------------------------------------------------------------------------------- Nutrition Risk Screening Details Patient Name: Date of Service: [name], [name] 05/10/2022 12:45 PM Medical Record Number: [id number] Patient Account Number: [id number] Date of Birth/Sex: Treating RN: [birth year] (57 y.o. [name] Primary Care Delmo Matty: [name] Other Clinician: Referring Rynell Ciotti: Treating Faren Florence/Extender: Mack Hook Weeks in Treatment: 0 Height (in): 72 Weight (lbs): 327 Body Mass Index (BMI): 44.3 SOFEE, MAYNE E ([id number]) [id number] Page 4 of 4 Nutrition Risk Screening Items Score Screening NUTRITION RISK SCREEN: I have an illness or condition that made me change the kind and/or amount of food I eat 2 Yes I eat fewer than two meals per day 0 No I eat few fruits and vegetables, or milk products 0 No I have three or more drinks of beer, liquor or wine almost every day 0 No I have tooth or mouth problems that make it hard for me to eat 0 No I don't always have enough money to buy the food I need 0 No I eat alone most of the time 0 No I take three or more different prescribed or over-the-counter drugs a day 1 Yes Without wanting to, I have lost or gained 10 pounds in the last six months 0 No I am not always physically able to shop, cook and/or feed myself 0 No Nutrition Protocols Good Risk Protocol Moderate Risk Protocol 0 Provide education on nutrition High Risk Proctocol Risk Level: Moderate Risk Score: 3 Electronic Signature(s) Signed: 05/10/2022 5:26:11  PM By: Deon Pilling RN, BSN Entered By: Deon Pilling on 05/10/2022 13:24:00

## 2022-05-12 NOTE — Progress Notes (Signed)
KEYIRA, [name] ([id number]) [id number] Page 1 of 7 Visit Report for 05/10/2022 Chief Complaint Document Details Patient Name: Date of Service: [name], [name] 05/10/2022 12:45 PM Medical Record Number: [id number] Patient Account Number: [id number] Date of Birth/Sex: Treating RN: 1965-06-19 (57 y.o. F) Primary Care Provider: [name] Other Clinician: Referring Provider: Treating Provider/Extender: Mack Hook Weeks in Treatment: 0 Information Obtained from: Patient Chief Complaint 05/26/2021; Left lower extremity wound Electronic Signature(s) Signed: 05/11/2022 3:34:03 PM By: Kalman Shan DO Entered By: Kalman Shan on 05/10/2022 14:04:01 -------------------------------------------------------------------------------- HPI Details Patient Name: Date of Service: [name], [name] 05/10/2022 12:45 PM Medical Record Number: [id number] Patient Account Number: [id number] Date of Birth/Sex: Treating RN: 02-27-1966 (57 y.o. F) Primary Care Provider: [name] Other Clinician: Referring Provider: Treating Provider/Extender: Mack Hook Weeks in Treatment: 0 History of Present Illness HPI Description: Admission 05/26/2021 Ms. [name] is a 57 year old female with a past medical history of lymphedema That presents to the clinic for a 61-month history of nonhealing ulcer to the posterior left leg. She has not been using any dressing to the wound bed. She has never experienced wounds before. She states that she sleeps in a recliner with her feet down. She has lymphedema pumps however has not been using them due to the wound and the increased swelling to her legs. She currently denies signs of infection. 4/4; patient presents for follow-up. She states that the wrap slid down after a couple days. She states she tolerated it well prior to her coming loose. She denies signs of infection. 4/11 comes in  today with a substantial wound on her left posterior calf. This is superficial in the setting of severe lymphedema. We have been using 3 layer compression. She had a PCR culture done last week which showed extremely high titers of Morganella but also high titers of Enterobacter and E. coli. We have forward the culture to [facility] for preparation of a topical antibiotic spray The patient is not systemically unwell no fever no chills. Our intake nurse noted very significant odor however 4/18; patient presents for follow-up. She was able to obtain her [employer] antibiotics and brought this in today. She was started on ciprofloxacin at last clinic visit but states this caused her nausea and vomiting and she stopped. She denies systemic signs of infection. She tolerated the 4-layer compression wrap well. She has no issues or complaints today. 4/25; patient presents for follow-up. She has been tolerating the 4-layer compression wrap well to the left lower extremity. Unfortunately she has a new wound to the right lower extremity. 5/2; patient presents for follow-up. She has no issues or complaints today. She tolerated the 4-layer compression wraps well. 5/9; patient presents for follow-up. She states she has an appointment to [facility] She has no issues or complaints today. Readmission 10/18/2021 Patient states that 1-2 month ago she noticed some drainage from her posterior left leg. She been keeping the area covered. She has lymphedema pumps but states she does not use these daily. [facility] compression garments [facility]. Due to cost she is unable to obtain these. She currently denies signs of infection. 8/29; patient presents for follow-up. She did well with silver alginate under 4-layer compression. Her custom compression garments are available for her to pick up. However due to financial cost she may not be able to afford  these. Readmission 05/10/2022 Ms. [name] is a 57 year old  female with a past medical history of lymphedema that presents the clinic for a 2-week history of open wound to the left lateral leg. The area occurred spontaneously. She Follows in the lymphedema clinic and gets wrapped twice weekly. She has had a good experience there so far. She denies signs of infection. She has been seen in our clinic before for this issue. She does well with 4-layer compression wraps. Electronic Signature(s) [name], [name] ([id number]) [id number] Page 2 of 7 Signed: 05/11/2022 3:34:03 PM By: Kalman Shan DO Entered By: Kalman Shan on 05/10/2022 14:08:36 -------------------------------------------------------------------------------- Physical Exam Details Patient Name: Date of Service: [name], [name] 05/10/2022 12:45 PM Medical Record Number: [id number] Patient Account Number: [id number] Date of Birth/Sex: Treating RN: 06/06/1965 (57 y.o. F) Primary Care Provider: [name] Other Clinician: Referring Provider: Treating Provider/Extender: Mack Hook Weeks in Treatment: 0 Constitutional respirations regular, non-labored and within target range for patient.. Cardiovascular 2+ dorsalis pedis/posterior tibialis pulses. Psychiatric pleasant and cooperative. Notes T the left lateral leg there is a small circular open wound with granulation tissue. No signs of surrounding infection. Stage IV lymphedema o Electronic Signature(s) Signed: 05/11/2022 3:34:03 PM By: Kalman Shan DO Entered By: Kalman Shan on 05/10/2022 14:06:27 -------------------------------------------------------------------------------- Physician Orders Details Patient Name: Date of Service: [name] 05/10/2022 12:45 PM Medical Record Number: [id number] Patient Account Number: [id number] Date of Birth/Sex: Treating RN: 23-Nov-1965 (57 y.o. [name] Primary Care Provider: [name] Other Clinician: Referring Provider: Treating Provider/Extender: Mack Hook Weeks in Treatment: 0 Verbal / Phone Orders: No Diagnosis Coding Follow-up Appointments ppointment in 1 week. - Dr. Heber [place] 05/15/2022 [place] room 5 Return A Bathing/ Shower/ Hygiene May shower with protection but do not get wound dressing(s) wet. Protect dressing(s) with water repellant cover (for example, large plastic bag) or a cast cover and may then take shower. Edema Control - Lymphedema / SCD / Other Lymphedema Pumps. Use Lymphedema pumps on leg(s) 2-3 times a day for 45-60 minutes. If wearing any wraps or hose, do not remove them. Continue exercising as instructed. Elevate legs to the level of the heart or above for 30 minutes daily and/or when sitting for 3-4 times a day throughout the day. Avoid standing for long periods of time. Exercise regularly Wound Treatment Wound #4 - Lower Leg Wound Laterality: Left, Lateral Cleanser: Vashe 5.8 (oz) 1 x Per Week/30 Days Discharge Instructions: Cleanse the wound with Vashe prior to applying a clean dressing using gauze sponges, not tissue or cotton balls. Peri-Wound Care: Sween Lotion (Moisturizing lotion) 1 x Per Week/30 Days Discharge Instructions: Apply moisturizing lotion as directed Topical: Gentamicin 1 x Per Week/30 Days Discharge Instructions: As directed by physician Topical: Mupirocin Ointment 1 x Per Week/30 Days Discharge Instructions: Apply Mupirocin (Bactroban) as instructed Prim Dressing: Maxorb Extra Ag+ Alginate Dressing, 2x2 (in/in) ary 1 x Per Week/30 Days [name], [name] ([id number]) [id number] Page 3 of 7 Discharge Instructions: Apply to wound bed as instructed Secondary Dressing: ABD Pad, 8x10 1 x Per Week/30 Days Discharge Instructions: Apply over primary dressing as directed. Compression Wrap: FourPress (4 layer compression wrap) 1 x Per  Week/30 Days Discharge Instructions: Apply four layer compression as directed. May also use Urgo K2 compression system as alternative. Compression Wrap: unna boot 1 x Per Week/30 Days Discharge Instructions: first layer to upper portion of lower leg. Electronic Signature(s) Signed: 05/11/2022 3:34:03 PM By: Kalman Shan DO Entered By: Kalman Shan on 05/10/2022 14:06:36 -------------------------------------------------------------------------------- Problem List Details Patient Name: Date  of Service: [name], [name] 05/10/2022 12:45 PM Medical Record Number: [id number] Patient Account Number: [id number] Date of Birth/Sex: Treating RN: 1965-11-26 (57 y.o. F) Primary Care Provider: [name] Other Clinician: Referring Provider: Treating Provider/Extender: Mack Hook Weeks in Treatment: 0 Active Problems ICD-10 Encounter Code Description Active Date MDM Diagnosis L97.822 Non-pressure chronic ulcer of other part of left lower leg with fat layer exposed3/13/2024 No Yes I89.0 Lymphedema, not elsewhere classified 05/10/2022 No Yes Inactive Problems Resolved Problems Electronic Signature(s) Signed: 05/11/2022 3:34:03 PM By: Kalman Shan DO Entered By: Kalman Shan on 05/10/2022 14:03:35 -------------------------------------------------------------------------------- Progress Note Details Patient Name: Date of Service: [name] 05/10/2022 12:45 PM Medical Record Number: [id number] Patient Account Number: [id number] Date of Birth/Sex: Treating RN: 1965-03-20 (56 y.o. F) Primary Care Provider: [name] Other Clinician: Referring Provider: Treating Provider/Extender: Mack Hook Weeks in Treatment: 0 Subjective Chief Complaint Information obtained from Patient 05/26/2021; Left lower extremity wound History of Present Illness (HPI) Admission 05/26/2021 Ms. Katha Rozario is a 57 year old female with a past  medical history of lymphedema That presents to the clinic for a 8-month history of nonhealing ulcer to the posterior left leg. She has not been using any dressing to the wound bed. She has never experienced wounds before. She states that she sleeps in a JERMERIA, TELLES ([id number]) [id number] Page 4 of 7 recliner with her feet down. She has lymphedema pumps however has not been using them due to the wound and the increased swelling to her legs. She currently denies signs of infection. 4/4; patient presents for follow-up. She states that the wrap slid down after a couple days. She states she tolerated it well prior to her coming loose. She denies signs of infection. 4/11 comes in today with a substantial wound on her left posterior calf. This is superficial in the setting of severe lymphedema. We have been using 3 layer compression. She had a PCR culture done last week which showed extremely high titers of Morganella but also high titers of Enterobacter and E. coli. We have forward the culture to [facility] for preparation of a topical antibiotic spray The patient is not systemically unwell no fever no chills. Our intake nurse noted very significant odor however 4/18; patient presents for follow-up. She was able to obtain her [employer] antibiotics and brought this in today. She was started on ciprofloxacin at last clinic visit but states this caused her nausea and vomiting and she stopped. She denies systemic signs of infection. She tolerated the 4-layer compression wrap well. She has no issues or complaints today. 4/25; patient presents for follow-up. She has been tolerating the 4-layer compression wrap well to the left lower extremity. Unfortunately she has a new wound to the right lower extremity. 5/2; patient presents for follow-up. She has no issues or complaints today. She tolerated the 4-layer compression wraps well. 5/9; patient presents for follow-up. She states  she has an appointment to [facility] She has no issues or complaints today. Readmission 10/18/2021 Patient states that 1-2 month ago she noticed some drainage from her posterior left leg. She been keeping the area covered. She has lymphedema pumps but states she does not use these daily. She [facility] to have custom compression garments made. Due to cost she is unable to obtain these. She currently denies signs of infection. 8/29; patient presents for follow-up. She did well with silver alginate under 4-layer compression. Her custom compression  garments are available for her to pick up. However due to financial cost she may not be able to afford these. Readmission 05/10/2022 Ms. Jalynn Dipirro is a 57 year old female with a past medical history of lymphedema that presents the clinic for a 2-week history of open wound to the left lateral leg. The area occurred spontaneously. She Follows in the lymphedema clinic and gets wrapped twice weekly. She has had a good experience there so far. She denies signs of infection. She has been seen in our clinic before for this issue. She does well with 4-layer compression wraps. Patient History Information obtained from Patient, Chart. Allergies abatacept (Severity: Severe, Reaction: Anaphylaxis), bee venom protein (honey bee) (Severity: Severe, Reaction: Anaphylaxis), latex, Augmentin (Reaction: Hives, Itching), Nulytely (Reaction: N/V), Cipro (Reaction: N/V) Family History Cancer - Father, Diabetes - Maternal Grandparents, Heart Disease - Mother,Siblings, Hypertension - Mother,Siblings, Lung Disease - Father, No family history of Hereditary Spherocytosis, Kidney Disease, Seizures, Stroke, Thyroid Problems, Tuberculosis. Social History Never smoker, Marital Status - Single, Alcohol Use - Rarely, Drug Use - No History, Caffeine Use - Daily - Tea. Medical History Eyes Patient has history of  Cataracts Hematologic/Lymphatic Patient has history of Anemia, Lymphedema - Since 2019 Respiratory Patient has history of Asthma Cardiovascular Patient has history of Deep Vein Thrombosis, Hypertension Musculoskeletal Patient has history of Osteoarthritis Medical A Surgical History Notes nd Respiratory Hx of PE Integumentary (Skin) Keloid to Right Neck Psychiatric depression Objective Constitutional respirations regular, non-labored and within target range for patient.. Vitals Time Taken: 1:04 AM, Height: 72 in, Weight: 327 lbs, BMI: 44.3, Temperature: 97.8 F, Pulse: 88 bpm, Respiratory Rate: 20 breaths/min, Blood Pressure: 144/79 mmHg. [name], [name] ([id number]) [id number] Page 5 of 7 Cardiovascular 2+ dorsalis pedis/posterior tibialis pulses. Psychiatric pleasant and cooperative. General Notes: T the left lateral leg there is a small circular open wound with granulation tissue. No signs of surrounding infection. Stage IV lymphedema o Integumentary (Hair, Skin) Wound #4 status is Open. Original cause of wound was Gradually Appeared. The date acquired was: 04/26/2022. The wound is located on the Left,Lateral Lower Leg. The wound measures 0.5cm length x 0.5cm width x 0.1cm depth; 0.196cm^2 area and 0.02cm^3 volume. There is Fat Layer (Subcutaneous Tissue) exposed. There is no tunneling or undermining noted. There is a medium amount of serosanguineous drainage noted. The wound margin is distinct with the outline attached to the wound base. There is large (67-100%) red granulation within the wound bed. There is no necrotic tissue within the wound bed. The periwound skin appearance exhibited: Scarring, Dry/Scaly, Hemosiderin Staining. The periwound skin appearance did not exhibit: Callus, Crepitus, Excoriation, Induration, Rash, Maceration, Atrophie Blanche, Cyanosis, Ecchymosis, Mottled, Pallor, Rubor, Erythema. General Notes: cobblestone appearance  to left leg. Assessment Active Problems ICD-10 Non-pressure chronic ulcer of other part of left lower leg with fat layer exposed Lymphedema, not elsewhere classified Patient presents with a small open wound to the left lateral leg secondary to lymphedema. No signs of infection. I recommended antibiotic ointment to address any bioburden and silver alginate under 4-layer compression. She has had this treatment in the past in our clinic and has done well with it. She knows not to get the wrap wet or her keep this on for more than 1 week. Procedures Wound #4 Pre-procedure diagnosis of Wound #4 is a Lymphedema located on the Left,Lateral Lower Leg . There was a Four Layer Compression Therapy Procedure by Deon Pilling, RN. Post procedure Diagnosis Wound #4: Same as Pre-Procedure Plan Follow-up Appointments: Return  Appointment in 1 week. - Dr. Heber [place] 05/15/2022 [place] room 5 Bathing/ Shower/ Hygiene: May shower with protection but do not get wound dressing(s) wet. Protect dressing(s) with water repellant cover (for example, large plastic bag) or a cast cover and may then take shower. Edema Control - Lymphedema / SCD / Other: Lymphedema Pumps. Use Lymphedema pumps on leg(s) 2-3 times a day for 45-60 minutes. If wearing any wraps or hose, do not remove them. Continue exercising as instructed. Elevate legs to the level of the heart or above for 30 minutes daily and/or when sitting for 3-4 times a day throughout the day. Avoid standing for long periods of time. Exercise regularly WOUND #4: - Lower Leg Wound Laterality: Left, Lateral Cleanser: Vashe 5.8 (oz) 1 x Per Week/30 Days Discharge Instructions: Cleanse the wound with Vashe prior to applying a clean dressing using gauze sponges, not tissue or cotton balls. Peri-Wound Care: Sween Lotion (Moisturizing lotion) 1 x Per Week/30 Days Discharge Instructions: Apply moisturizing lotion as directed Topical: Gentamicin 1 x Per Week/30 Days Discharge  Instructions: As directed by physician Topical: Mupirocin Ointment 1 x Per Week/30 Days Discharge Instructions: Apply Mupirocin (Bactroban) as instructed Prim Dressing: Maxorb Extra Ag+ Alginate Dressing, 2x2 (in/in) 1 x Per Week/30 Days ary Discharge Instructions: Apply to wound bed as instructed Secondary Dressing: ABD Pad, 8x10 1 x Per Week/30 Days Discharge Instructions: Apply over primary dressing as directed. Com pression Wrap: FourPress (4 layer compression wrap) 1 x Per Week/30 Days Discharge Instructions: Apply four layer compression as directed. May also use Urgo K2 compression system as alternative. Com pression Wrap: unna boot 1 x Per Week/30 Days Discharge Instructions: first layer to upper portion of lower leg. 1. Silver alginate with antibiotic ointment under 4-layer compressionooleft lower extremity 2. Follow-up in 1 week [name], [name] ([id number]) [id number] Page 6 of 7 Electronic Signature(s) Signed: 05/11/2022 3:34:03 PM By: Kalman Shan DO Entered By: Kalman Shan on 05/10/2022 14:08:49 -------------------------------------------------------------------------------- HxROS Details Patient Name: Date of Service: [name], [name] 05/10/2022 12:45 PM Medical Record Number: [id number] Patient Account Number: [id number] Date of Birth/Sex: Treating RN: 04-28-1965 (57 y.o. Tonita Phoenix, Lauren Primary Care Provider: [name] Other Clinician: Referring Provider: Treating Provider/Extender: Mack Hook Weeks in Treatment: 0 Information Obtained From Patient Chart Eyes Medical History: Positive for: Cataracts Hematologic/Lymphatic Medical History: Positive for: Anemia; Lymphedema - Since 2019 Respiratory Medical History: Positive for: Asthma Past Medical History Notes: Hx of PE Cardiovascular Medical History: Positive for: Deep Vein Thrombosis; Hypertension Integumentary (Skin) Medical  History: Past Medical History Notes: Keloid to Right Neck Musculoskeletal Medical History: Positive for: Osteoarthritis Psychiatric Medical History: Past Medical History Notes: depression HBO Extended History Items Eyes: Cataracts Immunizations Pneumococcal Vaccine: Received Pneumococcal Vaccination: Yes Received Pneumococcal Vaccination On or After 60th Birthday: No Implantable Devices None Family and Social History Cancer: Yes - Father; Diabetes: Yes - Maternal Grandparents; Heart Disease: Yes - Mother,Siblings; Hereditary Spherocytosis: No; Hypertension: Yes - Mother,Siblings; Kidney Disease: No; Lung Disease: Yes - Father; Seizures: No; Stroke: No; Thyroid Problems: No; Tuberculosis: No; Never smoker; Marital Status - Single; Alcohol Use: Rarely; Drug Use: No History; Caffeine Use: Daily - T Financial Concerns: No; Food, Clothing or Shelter Needs: No; Support ea; System Lacking: No; Transportation Concerns: No TESHIA, LUMA ([id number]) [id number] Page 7 of 7 Electronic Signature(s) Signed: 05/10/2022 11:58:37 AM By: Kalman Shan DO Signed: 05/10/2022 4:00:04 PM By: Rhae Hammock RN Entered By: Rhae Hammock on 05/09/2022 09:21:17 -------------------------------------------------------------------------------- SuperBill Details Patient Name: Date of Service:  [name], [name] 05/10/2022 Medical Record Number: [id number] Patient Account Number: [id number] Date of Birth/Sex: Treating RN: 1965/08/27 (57 y.o. [name] Primary Care Provider: [name] Other Clinician: Referring Provider: Treating Provider/Extender: Mack Hook Weeks in Treatment: 0 Diagnosis Coding ICD-10 Codes Code Description [id number] Non-pressure chronic ulcer of other part of left lower leg with fat layer exposed I89.0 Lymphedema, not elsewhere classified Facility Procedures : CPT4 Code: [id number] Description: [place] VISIT-LEV 3 EST PT Modifier: Quantity: 1 : CPT4 Code: [id number] Description: (Facility Use Only) [id number] - [place] LWR LT LEG Modifier: Quantity: 1 Physician Procedures : CPT4 Code Description Modifier [id number] - WC PHYS LEVEL 3 - EST PT ICD-10 Diagnosis Description L97.822 Non-pressure chronic ulcer of other part of left lower leg with fat layer exposed I89.0 Lymphedema, not elsewhere classified Quantity: 1 Electronic Signature(s) Signed: 05/11/2022 3:34:03 PM By: Kalman Shan DO Entered By: Kalman Shan on 05/10/2022 14:08:06

## 2022-05-12 NOTE — Progress Notes (Signed)
[name], [name] ([id number]) [phone].pdf Page 1 of 9 Visit Report for 05/10/2022 Allergy List Details Patient Name: Date of Service: [name], [name] 05/10/2022 12:45 PM Medical Record Number: [id number] Patient Account Number: [id number] Date of Birth/Sex: Treating RN: [birth year] (57 y.o. Tonita Phoenix, Lauren Primary Care Coleson Kant: [name] Other Clinician: Referring Teliyah Royal: Treating Berlie Persky/Extender: Mack Hook Weeks in Treatment: 0 Allergies Active Allergies abatacept Reaction: Anaphylaxis Severity: Severe bee venom protein (honey bee) Reaction: Anaphylaxis Severity: Severe latex Augmentin Reaction: Hives, Itching Nulytely Reaction: N/V Cipro Reaction: N/V Allergy Notes Electronic Signature(s) Signed: 05/10/2022 4:00:04 PM By: Rhae Hammock RN Entered By: Rhae Hammock on 05/09/2022 09:20:49 -------------------------------------------------------------------------------- Arrival Information Details Patient Name: Date of Service: [name] 05/10/2022 12:45 PM Medical Record Number: [id number] Patient Account Number: [id number] Date of Birth/Sex: Treating RN: [birth year] (57 y.o. F) Primary Care Skila Rollins: [name] Other Clinician: Referring Randal Goens: Treating Oceanna Arruda/Extender: Mack Hook Weeks in Treatment: 0 Visit Information Patient Arrived: Walker Arrival Time: 13:03 Accompanied By: self Transfer Assistance: None Patient Identification Verified: Yes Secondary Verification Process Completed: Yes History Since Last Visit All ordered tests and consults were completed: No Added or deleted any medications: No Any new allergies or adverse reactions: No Had a fall or experienced change in activities of daily living that may affect risk of falls: No Signs or symptoms of abuse/neglect since last visito No Hospitalized since last visit: No Implantable device outside  of the clinic excluding cellular tissue based products placed in the center since last visit: No Electronic Signature(s) Signed: 05/10/2022 1:30:17 PM By: Worthy Rancher Entered By: Worthy Rancher on 05/10/2022 13:06:43 [name] ([id number]) [id number] Page 2 of 9 -------------------------------------------------------------------------------- Clinic Level of Care Assessment Details Patient Name: Date of Service: [name], [name] 05/10/2022 12:45 PM Medical Record Number: [id number] Patient Account Number: [id number] Date of Birth/Sex: Treating RN: [birth year] (57 y.o. [name] Primary Care [name] Raby: [name] Other Clinician: Referring Yerlin Gasparyan: Treating Demeco Ducksworth/Extender: Mack Hook Weeks in Treatment: 0 Clinic Level of Care Assessment Items TOOL 1 Quantity Score X- 1 0 Use when EandM and Procedure is performed on INITIAL visit ASSESSMENTS - Nursing Assessment / Reassessment X- 1 20 General Physical Exam (combine w/ comprehensive assessment (listed just below) when performed on new pt. evals) X- 1 25 Comprehensive Assessment (HX, ROS, Risk Assessments, Wounds Hx, etc.) ASSESSMENTS - Wound and Skin Assessment / Reassessment X- 1 10 Dermatologic / Skin Assessment (not related to wound area) ASSESSMENTS - Ostomy and/or Continence Assessment and Care []  - 0 Incontinence Assessment and Management []  - 0 Ostomy Care Assessment and Management (repouching, etc.) PROCESS - Coordination of Care X - Simple Patient / Family Education for ongoing care 1 15 []  - 0 Complex (extensive) Patient / Family Education for ongoing care X- 1 10 Staff obtains Programmer, systems, Records, T Results / Process Orders est []  - 0 Staff telephones HHA, Nursing Homes / Clarify orders / etc []  - 0 Routine Transfer to another Facility (non-emergent condition) []  - 0 Routine Hospital Admission (non-emergent condition) X- 1 15 New Admissions /  Biomedical engineer / Ordering NPWT Apligraf, etc. , []  - 0 Emergency Hospital Admission (emergent condition) PROCESS - Special Needs []  - 0 Pediatric / Minor Patient Management []  - 0 Isolation Patient Management []  - 0 Hearing / Language / Visual special needs []  - 0 Assessment of Community assistance (transportation, D/C planning, etc.) []  - 0 Additional assistance / Altered mentation []  - 0 Support Surface(s) Assessment (bed, cushion,  seat, etc.) INTERVENTIONS - Miscellaneous []  - 0 External ear exam []  - 0 Patient Transfer (multiple staff / Civil Service fast streamer / Similar devices) []  - 0 Simple Staple / Suture removal (25 or less) []  - 0 Complex Staple / Suture removal (26 or more) []  - 0 Hypo/Hyperglycemic Management (do not check if billed separately) []  - 0 Ankle / Brachial Index (ABI) - do not check if billed separately Has the patient been seen at the hospital within the last three years: Yes Total Score: 95 Level Of Care: New/Established - Level 3 Electronic Signature(s) Signed: 05/10/2022 5:26:11 PM By: Deon Pilling RN, BSN Entered By: Deon Pilling on 05/10/2022 13:41:55 [name] ([id number]) [id number] Page 3 of 9 -------------------------------------------------------------------------------- Compression Therapy Details Patient Name: Date of Service: [name], [name] 05/10/2022 12:45 PM Medical Record Number: [id number] Patient Account Number: [id number] Date of Birth/Sex: Treating RN: 23-Feb-1966 (57 y.o. [name] Primary Care Jafeth Mustin: [name] Other Clinician: Referring Yakov Bergen: Treating Nahiem Dredge/Extender: Mack Hook Weeks in Treatment: 0 Compression Therapy Performed for Wound Assessment: Wound #4 Left,Lateral Lower Leg Performed By: Clinician Deon Pilling, RN Compression Type: Four Layer Post Procedure Diagnosis Same as Pre-procedure Electronic Signature(s) Signed: 05/10/2022  5:26:11 PM By: Deon Pilling RN, BSN Entered By: Deon Pilling on 05/10/2022 13:38:14 -------------------------------------------------------------------------------- Encounter Discharge Information Details Patient Name: Date of Service: [name], [name] 05/10/2022 12:45 PM Medical Record Number: [id number] Patient Account Number: [id number] Date of Birth/Sex: Treating RN: 06/08/1965 (57 y.o. [name] Primary Care Shakena Callari: [name] Other Clinician: Referring Gianluca Chhim: Treating Kobi Aller/Extender: Mack Hook Weeks in Treatment: 0 Encounter Discharge Information Items Discharge Condition: Stable Ambulatory Status: Walker Discharge Destination: Home Transportation: Private Auto Accompanied By: self Schedule Follow-up Appointment: Yes Clinical Summary of Care: Electronic Signature(s) Signed: 05/10/2022 5:26:11 PM By: Deon Pilling RN, BSN Entered By: Deon Pilling on 05/10/2022 13:42:25 -------------------------------------------------------------------------------- Lower Extremity Assessment Details Patient Name: Date of Service: [name], [name] 05/10/2022 12:45 PM Medical Record Number: [id number] Patient Account Number: [id number] Date of Birth/Sex: Treating RN: 1965/07/07 (57 y.o. [name] Primary Care Laretha Luepke: [name] Other Clinician: Referring Kage Willmann: Treating Ahmani Daoud/Extender: Mack Hook Weeks in Treatment: 0 Edema Assessment Assessed: [Left: Yes] [Right: No] Edema: [Left: Ye] [Right: s] Calf Left: Right: Point of Measurement: 35 cm From Medial Instep 69 cm Ankle Left: Right: Point of Measurement: 10 cm From Medial Instep 53 cm KAMREY, STEIMLE ([id number]) [id number] Page 4 of 9 Knee To Floor Left: Right: From Medial Instep 49 cm Vascular Assessment Pulses: Dorsalis Pedis Palpable: [Left:No] Electronic Signature(s) Signed: 05/10/2022 5:26:11 PM By: Deon Pilling RN, BSN Entered By: Deon Pilling on 05/10/2022 13:37:54 -------------------------------------------------------------------------------- Multi Wound Chart Details Patient Name: Date of Service: [name] 05/10/2022 12:45 PM Medical Record Number: [id number] Patient Account Number: [id number] Date of Birth/Sex: Treating RN: [birth year] (56 y.o. F) Primary Care Yeraldine Forney: [name] Other Clinician: Referring Marenda Accardi: Treating Denesia Donelan/Extender: Mack Hook Weeks in Treatment: 0 Vital Signs Height(in): 72 Pulse(bpm): 88 Weight(lbs): 327 Blood Pressure(mmHg): 144/79 Body Mass Index(BMI): 44.3 Temperature(F): 97.8 Respiratory Rate(breaths/min): 20 [4:Photos:] [N/A:N/A] Left, Lateral Lower Leg N/A N/A Wound Location: Gradually Appeared N/A N/A Wounding Event: Lymphedema N/A N/A Primary Etiology: Cataracts, Anemia, Lymphedema, N/A N/A Comorbid History: Asthma, Deep Vein Thrombosis, Hypertension, Osteoarthritis 04/26/2022 N/A N/A Date Acquired: 0 N/A N/A Weeks of Treatment: Open N/A N/A Wound Status: No N/A N/A Wound Recurrence: 0.5x0.5x0.1 N/A N/A Measurements L x W x D (cm) 0.196 N/A N/A A (cm) :  rea 0.02 N/A N/A Volume (cm) : Full Thickness Without Exposed N/A N/A Classification: Support Structures Medium N/A N/A Exudate Amount: Serosanguineous N/A N/A Exudate Type: red, brown N/A N/A Exudate Color: Distinct, outline attached N/A N/A Wound Margin: Large (67-100%) N/A N/A Granulation Amount: Red N/A N/A Granulation Quality: None Present (0%) N/A N/A Necrotic Amount: Fat Layer (Subcutaneous Tissue): Yes N/A N/A Exposed Structures: Fascia: No Tendon: No Muscle: No Joint: No Bone: No Small (1-33%) N/A N/A Epithelialization: Scarring: Yes N/A N/A Periwound Skin Texture: Excoriation: No Induration: No Callus: No [name], [name] ([id number]) [id number] Page 5 of 9 Crepitus:  No Rash: No Dry/Scaly: Yes N/A N/A Periwound Skin Moisture: Maceration: No Hemosiderin Staining: Yes N/A N/A Periwound Skin Color: Atrophie Blanche: No Cyanosis: No Ecchymosis: No Erythema: No Mottled: No Pallor: No Rubor: No cobblestone appearance to left leg. N/A N/A Assessment Notes: Compression Therapy N/A N/A Procedures Performed: Treatment Notes Wound #4 (Lower Leg) Wound Laterality: Left, Lateral Cleanser Vashe 5.8 (oz) Discharge Instruction: Cleanse the wound with Vashe prior to applying a clean dressing using gauze sponges, not tissue or cotton balls. Peri-Wound Care Sween Lotion (Moisturizing lotion) Discharge Instruction: Apply moisturizing lotion as directed Topical Gentamicin Discharge Instruction: As directed by physician Mupirocin Ointment Discharge Instruction: Apply Mupirocin (Bactroban) as instructed Primary Dressing Maxorb Extra Ag+ Alginate Dressing, 2x2 (in/in) Discharge Instruction: Apply to wound bed as instructed Secondary Dressing ABD Pad, 8x10 Discharge Instruction: Apply over primary dressing as directed. Secured With Compression Wrap FourPress (4 layer compression wrap) Discharge Instruction: Apply four layer compression as directed. May also use Urgo K2 compression system as alternative. unna boot Discharge Instruction: first layer to upper portion of lower leg. Compression Stockings Add-Ons Electronic Signature(s) Signed: 05/11/2022 3:34:03 PM By: Kalman Shan DO Entered By: Kalman Shan on 05/10/2022 14:03:41 -------------------------------------------------------------------------------- Multi-Disciplinary Care Plan Details Patient Name: Date of Service: [name], [name] 05/10/2022 12:45 PM Medical Record Number: [id number] Patient Account Number: [id number] Date of Birth/Sex: Treating RN: 07-20-1965 (57 y.o. [name] Primary Care Ines Rebel: [name] Other Clinician: Referring Doniqua Saxby: Treating  Kennah Hehr/Extender: Mack Hook Weeks in Treatment: 0 Active Inactive Orientation to the [facility] DALLYCE, RUNDE ([id number]) [id number] Page 6 of 9 Nursing Diagnoses: Knowledge deficit related to the wound healing center program Goals: Patient/caregiver will verbalize understanding of the [place] Date Initiated: 05/10/2022 Target Resolution Date: 05/17/2022 Goal Status: Active Interventions: Provide education on orientation to the wound center Notes: Wound/Skin Impairment Nursing Diagnoses: Knowledge deficit related to ulceration/compromised skin integrity Goals: Patient/caregiver will verbalize understanding of skin care regimen Date Initiated: 05/10/2022 Target Resolution Date: 06/01/2022 Goal Status: Active Interventions: Assess patient/caregiver ability to perform ulcer/skin care regimen upon admission and as needed Assess ulceration(s) every visit Provide education on ulcer and skin care Treatment Activities: Skin care regimen initiated : 05/10/2022 Topical wound management initiated : 05/10/2022 Notes: Electronic Signature(s) Signed: 05/10/2022 5:26:11 PM By: Deon Pilling RN, BSN Entered By: Deon Pilling on 05/10/2022 13:40:19 -------------------------------------------------------------------------------- Pain Assessment Details Patient Name: Date of Service: [name], [name] 05/10/2022 12:45 PM Medical Record Number: [id number] Patient Account Number: [id number] Date of Birth/Sex: Treating RN: 11/17/1965 (57 y.o. F) Primary Care Zavannah Deblois: [name] Other Clinician: Referring Shawnda Mauney: Treating Waunetta Riggle/Extender: Mack Hook Weeks in Treatment: 0 Active Problems Location of Pain Severity and Description of Pain Patient Has Paino No Site Locations Pain Management and Medication Current Pain Management: MISHA, SHERMAN ([id number])  [phone].pdf Page 7 of 9 Electronic Signature(s) Signed: 05/10/2022 1:30:17 PM  By: Worthy Rancher Entered By: Worthy Rancher on 05/10/2022 13:05:51 -------------------------------------------------------------------------------- Patient/Caregiver Education Details Patient Name: Date of Service: [name], [name] 3/13/2024andnbsp12:45 PM Medical Record Number: [id number] Patient Account Number: [id number] Date of Birth/Gender: Treating RN: March 27, 1965 (57 y.o. [name] Primary Care Physician: [name] Other Clinician: Referring Physician: Treating Physician/Extender: Lamount Cranker in Treatment: 0 Education Assessment Education Provided To: Patient Education Topics Provided Venous: Handouts: Controlling Swelling with Compression Stockings Methods: Explain/Verbal Responses: Reinforcements needed Electronic Signature(s) Signed: 05/10/2022 5:26:11 PM By: Deon Pilling RN, BSN Entered By: Deon Pilling on 05/10/2022 13:40:30 -------------------------------------------------------------------------------- Wound Assessment Details Patient Name: Date of Service: [name], [name] 05/10/2022 12:45 PM Medical Record Number: [id number] Patient Account Number: [id number] Date of Birth/Sex: Treating RN: 09-26-1965 (57 y.o. [name] Primary Care Afiya Ferrebee: [name] Other Clinician: Referring Karima Carrell: Treating Kayah Hecker/Extender: Mack Hook Weeks in Treatment: 0 Wound Status Wound Number: 4 Primary Lymphedema Etiology: Wound Location: Left, Lateral Lower Leg Wound Open Wounding Event: Gradually Appeared Status: Date Acquired: 04/26/2022 Comorbid Cataracts, Anemia, Lymphedema, Asthma, Deep Vein Weeks Of Treatment: 0 History: Thrombosis, Hypertension, Osteoarthritis Clustered Wound: No Photos Wound Measurements Length: (cm) 0.5 Width: (cm) 0.5 ALISEN, FREDRICKSON E ([id number]) Depth: (cm) Area:  (cm) Volume: (cm) % Reduction in Area: % Reduction in Volume: [id number] Page 8 of 9 0.1 Epithelialization: Small (1-33%) 0.196 Tunneling: No 0.02 Undermining: No Wound Description Classification: Full Thickness Without Exposed Support Structures Wound Margin: Distinct, outline attached Exudate Amount: Medium Exudate Type: Serosanguineous Exudate Color: red, brown Foul Odor After Cleansing: No Slough/Fibrino No Wound Bed Granulation Amount: Large (67-100%) Exposed Structure Granulation Quality: Red Fascia Exposed: No Necrotic Amount: None Present (0%) Fat Layer (Subcutaneous Tissue) Exposed: Yes Tendon Exposed: No Muscle Exposed: No Joint Exposed: No Bone Exposed: No Periwound Skin Texture Texture Color No Abnormalities Noted: No No Abnormalities Noted: No Callus: No Atrophie Blanche: No Crepitus: No Cyanosis: No Excoriation: No Ecchymosis: No Induration: No Erythema: No Rash: No Hemosiderin Staining: Yes Scarring: Yes Mottled: No Pallor: No Moisture Rubor: No No Abnormalities Noted: No Dry / Scaly: Yes Maceration: No Assessment Notes cobblestone appearance to left leg. Treatment Notes Wound #4 (Lower Leg) Wound Laterality: Left, Lateral Cleanser Vashe 5.8 (oz) Discharge Instruction: Cleanse the wound with Vashe prior to applying a clean dressing using gauze sponges, not tissue or cotton balls. Peri-Wound Care Sween Lotion (Moisturizing lotion) Discharge Instruction: Apply moisturizing lotion as directed Topical Gentamicin Discharge Instruction: As directed by physician Mupirocin Ointment Discharge Instruction: Apply Mupirocin (Bactroban) as instructed Primary Dressing Maxorb Extra Ag+ Alginate Dressing, 2x2 (in/in) Discharge Instruction: Apply to wound bed as instructed Secondary Dressing ABD Pad, 8x10 Discharge Instruction: Apply over primary dressing as directed. Secured With Compression Wrap FourPress (4 layer  compression wrap) Discharge Instruction: Apply four layer compression as directed. May also use Urgo K2 compression system as alternative. unna boot Discharge Instruction: first layer to upper portion of lower leg. Compression Stockings Add-Ons FELCIA, COONTZ ([id number]) [phone].pdf Page 9 of 9 Electronic Signature(s) Signed: 05/10/2022 5:26:11 PM By: Deon Pilling RN, BSN Entered By: Deon Pilling on 05/10/2022 13:33:12 -------------------------------------------------------------------------------- Vitals Details Patient Name: Date of Service: ANNAZETTE, GAMBINO 05/10/2022 12:45 PM Medical Record Number: [id number] Patient Account Number: [id number] Date of Birth/Sex: Treating RN: February 10, 1966 (57 y.o. F) Primary Care Johari Bennetts: [name] Other Clinician: Referring Addilee Neu: Treating Loan Oguin/Extender: Mack Hook Weeks in Treatment: 0 Vital Signs Time Taken: 01:04 Temperature (F): 97.8 Height (in): 72 Pulse (bpm): 88 Weight (lbs): 327 Respiratory  Rate (breaths/min): 20 Body Mass Index (BMI): 44.3 Blood Pressure (mmHg): 144/79 Reference Range: 80 - 120 mg / dl Electronic Signature(s) Signed: 05/10/2022 1:30:17 PM By: Worthy Rancher Entered By: Worthy Rancher on 05/10/2022 13:06:59

## 2022-05-15 ENCOUNTER — Ambulatory Visit (HOSPITAL_BASED_OUTPATIENT_CLINIC_OR_DEPARTMENT_OTHER): Payer: Medicare HMO | Admitting: Internal Medicine

## 2022-05-16 ENCOUNTER — Ambulatory Visit: Payer: Medicare HMO | Admitting: Occupational Therapy

## 2022-05-18 ENCOUNTER — Ambulatory Visit: Payer: Medicare HMO | Admitting: Occupational Therapy

## 2022-05-22 ENCOUNTER — Encounter (HOSPITAL_BASED_OUTPATIENT_CLINIC_OR_DEPARTMENT_OTHER): Payer: Medicare HMO | Admitting: Internal Medicine

## 2022-05-22 DIAGNOSIS — L97822 Non-pressure chronic ulcer of other part of left lower leg with fat layer exposed: Secondary | ICD-10-CM

## 2022-05-22 DIAGNOSIS — I890 Lymphedema, not elsewhere classified: Secondary | ICD-10-CM | POA: Diagnosis not present

## 2022-05-22 NOTE — Progress Notes (Signed)
MADALENE, [name] ([id number]) [phone].pdf Page 1 of 7 Visit Report for 05/22/2022 Arrival Information Details Patient Name: Date of Service: [name], [name] 05/22/2022 12:30 PM Medical Record Number: [id number] Patient Account Number: [id number] Date of Birth/Sex: Treating RN: Feb 01, 1966 (57 y.o. [name], [name] Primary Care Bartosz Luginbill: [name] Other Clinician: Referring Frida Wahlstrom: Treating Teodoro Jeffreys/Extender: Mack Hook Weeks in Treatment: 1 Visit Information History Since Last Visit Added or deleted any medications: No Patient Arrived: [name] Any new allergies or adverse reactions: No Arrival Time: 12:46 Had a fall or experienced change in No Accompanied By: self activities of daily living that may affect Transfer Assistance: None risk of falls: Patient Identification Verified: Yes Signs or symptoms of abuse/neglect since last visito No Secondary Verification Process Completed: Yes Hospitalized since last visit: No Patient Requires Transmission-Based Precautions: No Implantable device outside of the clinic excluding No Patient Has Alerts: No cellular tissue based products placed in the center since last visit: Has Dressing in Place as Prescribed: Yes Has Compression in Place as Prescribed: Yes Pain Present Now: No Electronic Signature(s) Signed: 05/22/2022 2:41:34 PM By: Deon Pilling RN, BSN Entered By: Deon Pilling on 05/22/2022 12:47:01 -------------------------------------------------------------------------------- Compression Therapy Details Patient Name: Date of Service: [name], [name] 05/22/2022 12:30 PM Medical Record Number: [id number] Patient Account Number: [id number] Date of Birth/Sex: Treating RN: 1965/05/15 (57 y.o. [name] Primary Care Nyhla Mountjoy: [name] Other Clinician: Referring Brytney Somes: Treating Thi Sisemore/Extender: Mack Hook Weeks in Treatment:  1 Compression Therapy Performed for Wound Assessment: Wound #4 Left,Lateral Lower Leg Performed By: Clinician Deon Pilling, RN Compression Type: Four Layer Post Procedure Diagnosis Same as Pre-procedure Electronic Signature(s) Signed: 05/22/2022 2:41:34 PM By: Deon Pilling RN, BSN Entered By: Deon Pilling on 05/22/2022 13:16:37 -------------------------------------------------------------------------------- Encounter Discharge Information Details Patient Name: Date of Service: [name], [name] 05/22/2022 12:30 PM Medical Record Number: [id number] Patient Account Number: [id number] Date of Birth/Sex: Treating RN: 07/08/1965 (57 y.o. [name] Primary Care Aggie Douse: [name] Other Clinician: Referring Tema Alire: Treating Jmichael Gille/Extender: Lamount Cranker in Treatment: 1 Encounter Discharge Information Items Discharge Condition: Stable Ambulatory Status: [facility] Discharge Destination: Home Transportation: [address] [place], [place] ([id number]) [phone].pdf Page 2 of 7 Accompanied By: self Schedule Follow-up Appointment: Yes Clinical Summary of Care: Electronic Signature(s) Signed: 05/22/2022 2:41:34 PM By: Deon Pilling RN, BSN Entered By: Deon Pilling on 05/22/2022 13:18:51 -------------------------------------------------------------------------------- Lower Extremity Assessment Details Patient Name: Date of Service: [name], PETTAWAY 05/22/2022 12:30 PM Medical Record Number: [id number] Patient Account Number: [id number] Date of Birth/Sex: Treating RN: December 14, 1965 (57 y.o. [name] Primary Care Cory Kitt: [name] Other Clinician: Referring Muriel Wilber: Treating Marshea Wisher/Extender: Mack Hook Weeks in Treatment: 1 Edema Assessment Assessed: [Left: Yes] [Right: No] Edema: [Left: Ye] [Right: s] Calf Left: Right: Point of Measurement: 35 cm From Medial Instep 54  cm Ankle Left: Right: Point of Measurement: 10 cm From Medial Instep 53.5 cm Vascular Assessment Pulses: Dorsalis Pedis Palpable: [Left:Yes] Electronic Signature(s) Signed: 05/22/2022 2:41:34 PM By: Deon Pilling RN, BSN Entered By: Deon Pilling on 05/22/2022 12:55:45 -------------------------------------------------------------------------------- Multi Wound Chart Details Patient Name: Date of Service: Ralene [name] 05/22/2022 12:30 PM Medical Record Number: [id number] Patient Account Number: [id number] Date of Birth/Sex: Treating RN: 04/11/1965 (57 y.o. F) Primary Care Jonisha Kindig: [name] Other Clinician: Referring Jermar Colter: Treating Rubbie Goostree/Extender: Mack Hook Weeks in Treatment: 1 Vital Signs Height(in): 72 Pulse(bpm): 101 Weight(lbs): 327 Blood Pressure(mmHg): 143/81 Body Mass Index(BMI): 44.3 Temperature(F): 98 Respiratory Rate(breaths/min): 20 [4:Photos:] [N/A:N/A]  Left, Lateral Lower Leg N/A N/A Wound Location: Gradually Appeared N/A N/A Wounding Event: Lymphedema N/A N/A Primary Etiology: Cataracts, Anemia, Lymphedema, N/A N/A Comorbid History: Asthma, Deep Vein Thrombosis, Hypertension, Osteoarthritis 04/26/2022 N/A N/A Date Acquired: 1 N/A N/A Weeks of Treatment: Open N/A N/A Wound Status: No N/A N/A Wound Recurrence: 0.1x0.1x0.1 N/A N/A Measurements L x W x D (cm) 0.008 N/A N/A A (cm) : rea 0.001 N/A N/A Volume (cm) : 95.90% N/A N/A % Reduction in Area: 95.00% N/A N/A % Reduction in Volume: Full Thickness Without Exposed N/A N/A Classification: Support Structures None Present N/A N/A Exudate Amount: Distinct, outline attached N/A N/A Wound Margin: Large (67-100%) N/A N/A Granulation Amount: Pink, Pale N/A N/A Granulation Quality: None Present (0%) N/A N/A Necrotic Amount: Fascia: No N/A N/A Exposed Structures: Fat Layer (Subcutaneous Tissue): No Tendon: No Muscle: No Joint: No Bone:  No Large (67-100%) N/A N/A Epithelialization: Scarring: Yes N/A N/A Periwound Skin Texture: Excoriation: No Induration: No Callus: No Crepitus: No Rash: No Dry/Scaly: Yes N/A N/A Periwound Skin Moisture: Maceration: No Hemosiderin Staining: Yes N/A N/A Periwound Skin Color: Atrophie Blanche: No Cyanosis: No Ecchymosis: No Erythema: No Mottled: No Pallor: No Rubor: No Compression Therapy N/A N/A Procedures Performed: Treatment Notes Wound #4 (Lower Leg) Wound Laterality: Left, Lateral Cleanser Vashe 5.8 (oz) Discharge Instruction: Cleanse the wound with Vashe prior to applying a clean dressing using gauze sponges, not tissue or cotton balls. Peri-Wound Care Sween Lotion (Moisturizing lotion) Discharge Instruction: Apply moisturizing lotion as directed Topical Gentamicin Discharge Instruction: As directed by physician Mupirocin Ointment Discharge Instruction: Apply Mupirocin (Bactroban) as instructed Primary Dressing Maxorb Extra Ag+ Alginate Dressing, 2x2 (in/in) Discharge Instruction: Apply to wound bed as instructed Secondary Dressing ABD Pad, 8x10 Discharge Instruction: Apply over primary dressing as directed. Secured With Compression Wrap FourPress (4 layer compression wrap) Discharge Instruction: Apply four layer compression as directed. May also use Urgo K2 compression system as alternative. [address] [place], [place] ([id number]) [id number].pdf Page 4 of 7 Discharge Instruction: first layer to upper portion of lower leg. Compression Stockings Add-Ons Electronic Signature(s) Signed: 05/22/2022 2:44:54 PM By: Kalman Shan DO Entered By: Kalman Shan on 05/22/2022 13:26:16 -------------------------------------------------------------------------------- Multi-Disciplinary Care Plan Details Patient Name: Date of Service: YANINA, ARLEN 05/22/2022 12:30 PM Medical Record Number: [id number] Patient Account Number:  [id number] Date of Birth/Sex: Treating RN: 1965/12/16 (57 y.o. [name] Primary Care Marixa Mellott: [name] Other Clinician: Referring Kizzie Cotten: Treating Deretha Ertle/Extender: Mack Hook Weeks in Treatment: 1 Active Inactive Wound/Skin Impairment Nursing Diagnoses: Knowledge deficit related to ulceration/compromised skin integrity Goals: Patient/caregiver will verbalize understanding of skin care regimen Date Initiated: 05/10/2022 Target Resolution Date: 06/01/2022 Goal Status: Active Interventions: Assess patient/caregiver ability to perform ulcer/skin care regimen upon admission and as needed Assess ulceration(s) every visit Provide education on ulcer and skin care Treatment Activities: Skin care regimen initiated : 05/10/2022 Topical wound management initiated : 05/10/2022 Notes: Electronic Signature(s) Signed: 05/22/2022 2:41:34 PM By: Deon Pilling RN, BSN Entered By: Deon Pilling on 05/22/2022 12:59:01 -------------------------------------------------------------------------------- Pain Assessment Details Patient Name: Date of Service: KATRINA, VATH 05/22/2022 12:30 PM Medical Record Number: [id number] Patient Account Number: [id number] Date of Birth/Sex: Treating RN: 27-Jun-1965 (57 y.o. [name] Primary Care Shakeela Rabadan: [name] Other Clinician: Referring Jaquia Benedicto: Treating Sana Tessmer/Extender: Mack Hook Weeks in Treatment: 1 Active Problems Location of Pain Severity and Description of Pain Patient Has Paino No Site Locations [place] ([id number]) [phone].pdf Page 5 of 7 Pain Management and Medication  Current Pain Management: Electronic Signature(s) Signed: 05/22/2022 2:41:34 PM By: Deon Pilling RN, BSN Entered By: Deon Pilling on 05/22/2022 12:47:08 -------------------------------------------------------------------------------- Patient/Caregiver Education  Details Patient Name: Date of Service: Ralene [name] 3/25/2024andnbsp12:30 PM Medical Record Number: [id number] Patient Account Number: [id number] Date of Birth/Gender: Treating RN: [birth year] (57 y.o. [name] Primary Care Physician: [name] Other Clinician: Referring Physician: Treating Physician/Extender: Lamount Cranker in Treatment: 1 Education Assessment Education Provided To: Patient Education Topics Provided Wound/Skin Impairment: Handouts: Caring for Your Ulcer Methods: Explain/Verbal Responses: Reinforcements needed Electronic Signature(s) Signed: 05/22/2022 2:41:34 PM By: Deon Pilling RN, BSN Entered By: Deon Pilling on 05/22/2022 12:59:11 -------------------------------------------------------------------------------- Wound Assessment Details Patient Name: Date of Service: [name], [name] 05/22/2022 12:30 PM Medical Record Number: [id number] Patient Account Number: [id number] Date of Birth/Sex: Treating RN: 10-25-1965 (57 y.o. [name] Primary Care Analycia Khokhar: [name] Other Clinician: Referring Bowden Boody: Treating Aixa Corsello/Extender: Mack Hook Weeks in Treatment: 1 Wound Status Wound Number: 4 Primary Lymphedema Etiology: Wound Location: Left, Lateral Lower Leg Wound Open Wounding Event: Gradually Appeared Status: Date Acquired: 04/26/2022 Comorbid Cataracts, Anemia, Lymphedema, Asthma, Deep Vein Weeks Of Treatment: 1 [name], [name] ([id number]) [id number].pdf Page 6 of 7 Weeks Of Treatment: 1 History: Thrombosis, Hypertension, Osteoarthritis Clustered Wound: No Photos Wound Measurements Length: (cm) 0.1 Width: (cm) 0.1 Depth: (cm) 0.1 Area: (cm) 0.008 Volume: (cm) 0.001 % Reduction in Area: 95.9% % Reduction in Volume: 95% Epithelialization: Large (67-100%) Tunneling: No Undermining: No Wound Description Classification: Full Thickness  Without Exposed Support Structures Wound Margin: Distinct, outline attached Exudate Amount: None Present Foul Odor After Cleansing: No Slough/Fibrino No Wound Bed Granulation Amount: Large (67-100%) Exposed Structure Granulation Quality: Pink, Pale Fascia Exposed: No Necrotic Amount: None Present (0%) Fat Layer (Subcutaneous Tissue) Exposed: No Tendon Exposed: No Muscle Exposed: No Joint Exposed: No Bone Exposed: No Periwound Skin Texture Texture Color No Abnormalities Noted: No No Abnormalities Noted: No Callus: No Atrophie Blanche: No Crepitus: No Cyanosis: No Excoriation: No Ecchymosis: No Induration: No Erythema: No Rash: No Hemosiderin Staining: Yes Scarring: Yes Mottled: No Pallor: No Moisture Rubor: No No Abnormalities Noted: No Dry / Scaly: Yes Maceration: No Treatment Notes Wound #4 (Lower Leg) Wound Laterality: Left, Lateral Cleanser Vashe 5.8 (oz) Discharge Instruction: Cleanse the wound with Vashe prior to applying a clean dressing using gauze sponges, not tissue or cotton balls. Peri-Wound Care Sween Lotion (Moisturizing lotion) Discharge Instruction: Apply moisturizing lotion as directed Topical Gentamicin Discharge Instruction: As directed by physician Mupirocin Ointment Discharge Instruction: Apply Mupirocin (Bactroban) as instructed Primary Dressing Maxorb Extra Ag+ Alginate Dressing, 2x2 (in/in) Discharge Instruction: Apply to wound bed as instructed [name], [name] ([id number]) [phone].pdf Page 7 of 7 Secondary Dressing ABD Pad, 8x10 Discharge Instruction: Apply over primary dressing as directed. Secured With Compression Wrap FourPress (4 layer compression wrap) Discharge Instruction: Apply four layer compression as directed. May also use Urgo K2 compression system as alternative. unna boot Discharge Instruction: first layer to upper portion of lower leg. Compression Stockings Add-Ons Electronic  Signature(s) Signed: 05/22/2022 2:41:34 PM By: Deon Pilling RN, BSN Entered By: Deon Pilling on 05/22/2022 12:58:21 -------------------------------------------------------------------------------- Vitals Details Patient Name: Date of Service: [name], [name] 05/22/2022 12:30 PM Medical Record Number: [id number] Patient Account Number: [id number] Date of Birth/Sex: Treating RN: Nov 16, 1965 (57 y.o. [name] Primary Care Jayelle Page: [name] Other Clinician: Referring Harlin Mazzoni: Treating Lenora Gomes/Extender: Mack Hook Weeks in Treatment: 1 Vital Signs Time Taken: 12:45 Temperature (F): 98 Height (in):  72 Pulse (bpm): 101 Weight (lbs): 327 Respiratory Rate (breaths/min): 20 Body Mass Index (BMI): 44.3 Blood Pressure (mmHg): 143/81 Reference Range: 80 - 120 mg / dl Electronic Signature(s) Signed: 05/22/2022 2:41:34 PM By: Deon Pilling RN, BSN Entered By: Deon Pilling on 05/22/2022 12:49:16

## 2022-05-22 NOTE — Progress Notes (Signed)
[name], [name] ([id number]) [id number] Page 1 of 7 Visit Report for 05/22/2022 Chief Complaint Document Details Patient Name: Date of Service: [name], [name] 05/22/2022 12:30 PM Medical Record Number: [id number] Patient Account Number: [id number] Date of Birth/Sex: Treating RN: Jun 11, 1965 (57 y.o. F) Primary Care Provider: [name] Other Clinician: Referring Provider: Treating Provider/Extender: Mack Hook Weeks in Treatment: 1 Information Obtained from: Patient Chief Complaint 05/26/2021; Left lower extremity wound Electronic Signature(s) Signed: 05/22/2022 2:44:54 PM By: Kalman Shan DO Entered By: Kalman Shan on 05/22/2022 13:26:29 -------------------------------------------------------------------------------- HPI Details Patient Name: Date of Service: [name], [name] 05/22/2022 12:30 PM Medical Record Number: [id number] Patient Account Number: [id number] Date of Birth/Sex: Treating RN: [birth year] (57 y.o. F) Primary Care Provider: [name] Other Clinician: Referring Provider: Treating Provider/Extender: Mack Hook Weeks in Treatment: 1 History of Present Illness HPI Description: Admission 05/26/2021 [name] is a 57 year old female with a past medical history of lymphedema That presents to the clinic for a 16-month history of nonhealing ulcer to the posterior left leg. She has not been using any dressing to the wound bed. She has never experienced wounds before. She states that she sleeps in a recliner with her feet down. She has lymphedema pumps however has not been using them due to the wound and the increased swelling to her legs. She currently denies signs of infection. 4/4; patient presents for follow-up. She states that the wrap slid down after a couple days. She states she tolerated it well prior to her coming loose. She denies signs of infection. 4/11 comes in  today with a substantial wound on her left posterior calf. This is superficial in the setting of severe lymphedema. We have been using 3 layer compression. She had a PCR culture done last week which showed extremely high titers of Morganella but also high titers of Enterobacter and [name]. We have forward the culture to [facility] for preparation of a topical antibiotic spray The patient is not systemically unwell no fever no chills. Our intake nurse noted very significant odor however 4/18; patient presents for follow-up. She was able to obtain her [employer] antibiotics and brought this in today. She was started on ciprofloxacin at last clinic visit but states this caused her nausea and vomiting and she stopped. She denies systemic signs of infection. She tolerated the 4-layer compression wrap well. She has no issues or complaints today. 4/25; patient presents for follow-up. She has been tolerating the 4-layer compression wrap well to the left lower extremity. Unfortunately she has a new wound to the right lower extremity. 5/2; patient presents for follow-up. She has no issues or complaints today. She tolerated the 4-layer compression wraps well. 5/9; patient presents for follow-up. She states she has an appointment to [facility] She has no issues or complaints today. Readmission 10/18/2021 Patient states that 1-2 month ago she noticed some drainage from her posterior left leg. She been keeping the area covered. She has lymphedema pumps but states she does not use these daily. [facility] compression garments [facility]. Due to cost she is unable to obtain these. She currently denies signs of infection. 8/29; patient presents for follow-up. She did well with silver alginate under 4-layer compression. Her custom compression garments are available for her to pick up. However due to financial cost she may not be able to afford  these. Readmission 05/10/2022 [name] is a 57 year old  female with a past medical history of lymphedema that presents the clinic for a 2-week history of open wound to the left lateral leg. The area occurred spontaneously. She Follows in the lymphedema clinic and gets wrapped twice weekly. She has had a good experience there so far. She denies signs of infection. She has been seen in our clinic before for this issue. She does well with 4-layer compression wraps. 3/25; patient missed her last clinic follow-up Due to a death in the family. She has had the wrap on for more than 2 weeks. She knows she is not supposed to have this on for more than 7 days. We have been using silver alginate with antibiotic ointment under 4-layer compression. She has no issues or complaints today. [name], [name] ([id number]) [id number] Page 2 of 7 Electronic Signature(s) Signed: 05/22/2022 2:44:54 PM By: Kalman Shan DO Entered By: Kalman Shan on 05/22/2022 13:27:20 -------------------------------------------------------------------------------- Physical Exam Details Patient Name: Date of Service: [name], [name] 05/22/2022 12:30 PM Medical Record Number: [id number] Patient Account Number: [id number] Date of Birth/Sex: Treating RN: [birth year] (57 y.o. F) Primary Care Provider: [name] Other Clinician: Referring Provider: Treating Provider/Extender: Mack Hook Weeks in Treatment: 1 Constitutional respirations regular, non-labored and within target range for patient.. Cardiovascular 2+ dorsalis pedis/posterior tibialis pulses. Psychiatric pleasant and cooperative. Notes T the left lateral leg there is a small circular open wound with granulation tissue. No signs of surrounding infection. Stage IV lymphedema o Electronic Signature(s) Signed: 05/22/2022 2:44:54 PM By: Kalman Shan DO Entered By: Kalman Shan on 05/22/2022  13:27:44 -------------------------------------------------------------------------------- Physician Orders Details Patient Name: Date of Service: [name], [name] 05/22/2022 12:30 PM Medical Record Number: [id number] Patient Account Number: [id number] Date of Birth/Sex: Treating RN: 05-30-1965 (57 y.o. [name] Primary Care Provider: [name] Other Clinician: Referring Provider: Treating Provider/Extender: Mack Hook Weeks in Treatment: 1 Verbal / Phone Orders: No Diagnosis Coding ICD-10 Coding Code Description [phone] Non-pressure chronic ulcer of other part of left lower leg with fat layer exposed I89.0 Lymphedema, not elsewhere classified Follow-up Appointments ppointment in 2 weeks. - [id number] 06/05/2022 Monday room 8 Dr. Heber [place] Return A Nurse Visit: - Monday 05/29/2022 1230 room 8 Bathing/ Shower/ Hygiene May shower with protection but do not get wound dressing(s) wet. Protect dressing(s) with water repellant cover (for example, large plastic bag) or a cast cover and may then take shower. Edema Control - Lymphedema / SCD / Other Lymphedema Pumps. Use Lymphedema pumps on leg(s) 2-3 times a day for 45-60 minutes. If wearing any wraps or hose, do not remove them. Continue exercising as instructed. Elevate legs to the level of the heart or above for 30 minutes daily and/or when sitting for 3-4 times a day throughout the day. Avoid standing for long periods of time. Exercise regularly Wound Treatment Wound #4 - Lower Leg Wound Laterality: Left, Lateral Cleanser: Vashe 5.8 (oz) 1 x Per Week/30 Days Discharge Instructions: Cleanse the wound with Vashe prior to applying a clean dressing using gauze sponges, not tissue or cotton balls. EMARIA, SCHEIB ([id number]) [id number] Page 3 of 7 Peri-Wound Care: Sween Lotion (Moisturizing lotion) 1 x Per Week/30 Days Discharge Instructions: Apply moisturizing lotion as  directed Topical: Gentamicin 1 x Per Week/30 Days Discharge Instructions: As directed by physician Topical: Mupirocin Ointment 1 x Per Week/30 Days Discharge Instructions: Apply Mupirocin (Bactroban) as instructed Prim Dressing: Maxorb Extra Ag+ Alginate Dressing, 2x2 (in/in) 1 x Per Week/30 Days ary Discharge Instructions: Apply to wound bed  as instructed Secondary Dressing: ABD Pad, 8x10 1 x Per Week/30 Days Discharge Instructions: Apply over primary dressing as directed. Compression Wrap: FourPress (4 layer compression wrap) 1 x Per Week/30 Days Discharge Instructions: Apply four layer compression as directed. May also use Urgo K2 compression system as alternative. Compression Wrap: unna boot 1 x Per Week/30 Days Discharge Instructions: first layer to upper portion of lower leg. Electronic Signature(s) Signed: 05/22/2022 2:44:54 PM By: Kalman Shan DO Entered By: Kalman Shan on 05/22/2022 13:27:51 -------------------------------------------------------------------------------- Problem List Details Patient Name: Date of Service: [name], [name] 05/22/2022 12:30 PM Medical Record Number: [id number] Patient Account Number: [id number] Date of Birth/Sex: Treating RN: November 19, 1965 (57 y.o. [name] Primary Care Provider: [name] Other Clinician: Referring Provider: Treating Provider/Extender: Mack Hook Weeks in Treatment: 1 Active Problems ICD-10 Encounter Code Description Active Date MDM Diagnosis [phone] Non-pressure chronic ulcer of other part of left lower leg with fat layer exposed3/13/2024 No Yes I89.0 Lymphedema, not elsewhere classified 05/10/2022 No Yes Inactive Problems Resolved Problems Electronic Signature(s) Signed: 05/22/2022 2:44:54 PM By: Kalman Shan DO Entered By: Kalman Shan on 05/22/2022 13:26:10 -------------------------------------------------------------------------------- Progress Note Details Patient  Name: Date of Service: [name] 05/22/2022 12:30 PM Medical Record Number: [id number] Patient Account Number: [id number] Date of Birth/Sex: Treating RN: [birth year] (57 y.o. F) Primary Care Provider: [name] Other Clinician: Referring Provider: Treating Provider/Extender: Lamount Cranker in Treatment: 1 [name], [name] ([id number]) [id number] Page 4 of 7 Subjective Chief Complaint Information obtained from Patient 05/26/2021; Left lower extremity wound History of Present Illness (HPI) Admission 05/26/2021 Ms. Esthefani Bastien is a 57 year old female with a past medical history of lymphedema That presents to the clinic for a 81-month history of nonhealing ulcer to the posterior left leg. She has not been using any dressing to the wound bed. She has never experienced wounds before. She states that she sleeps in a recliner with her feet down. She has lymphedema pumps however has not been using them due to the wound and the increased swelling to her legs. She currently denies signs of infection. 4/4; patient presents for follow-up. She states that the wrap slid down after a couple days. She states she tolerated it well prior to her coming loose. She denies signs of infection. 4/11 comes in today with a substantial wound on her left posterior calf. This is superficial in the setting of severe lymphedema. We have been using 3 layer compression. She had a PCR culture done last week which showed extremely high titers of Morganella but also high titers of Enterobacter and [name]. We have forward the culture to [facility] for preparation of a topical antibiotic spray The patient is not systemically unwell no fever no chills. Our intake nurse noted very significant odor however 4/18; patient presents for follow-up. She was able to obtain her [employer] antibiotics and brought this in today. She was started on ciprofloxacin at last  clinic visit but states this caused her nausea and vomiting and she stopped. She denies systemic signs of infection. She tolerated the 4-layer compression wrap well. She has no issues or complaints today. 4/25; patient presents for follow-up. She has been tolerating the 4-layer compression wrap well to the left lower extremity. Unfortunately she has a new wound to the right lower extremity. 5/2; patient presents for follow-up. She has no issues or complaints today. She tolerated the 4-layer compression wraps well. 5/9; patient presents for follow-up. She states she has an appointment to get her  [facility] She has no issues or complaints today. Readmission 10/18/2021 Patient states that 1-2 month ago she noticed some drainage from her posterior left leg. She been keeping the area covered. She has lymphedema pumps but states she does not use these daily. She [facility] compression garments made. Due to cost she is unable to obtain these. She currently denies signs of infection. 8/29; patient presents for follow-up. She did well with silver alginate under 4-layer compression. Her custom compression garments are available for her to pick up. However due to financial cost she may not be able to afford these. Readmission 05/10/2022 Ms. [name] is a 57 year old female with a past medical history of lymphedema that presents the clinic for a 2-week history of open wound to the left lateral leg. The area occurred spontaneously. She Follows in the lymphedema clinic and gets wrapped twice weekly. She has had a good experience there so far. She denies signs of infection. She has been seen in our clinic before for this issue. She does well with 4-layer compression wraps. 3/25; patient missed her last clinic follow-up Due to a death in the family. She has had the wrap on for more than 2 weeks. She knows she is not supposed to have this on for more  than 7 days. We have been using silver alginate with antibiotic ointment under 4-layer compression. She has no issues or complaints today. Patient History Information obtained from Patient, Chart. Family History Cancer - Father, Diabetes - Maternal Grandparents, Heart Disease - Mother,Siblings, Hypertension - Mother,Siblings, Lung Disease - Father, No family history of Hereditary Spherocytosis, Kidney Disease, Seizures, Stroke, Thyroid Problems, Tuberculosis. Social History Never smoker, Marital Status - Single, Alcohol Use - Rarely, Drug Use - No History, Caffeine Use - Daily - Tea. Medical History Eyes Patient has history of Cataracts Hematologic/Lymphatic Patient has history of Anemia, Lymphedema - Since 2019 Respiratory Patient has history of Asthma Cardiovascular Patient has history of Deep Vein Thrombosis, Hypertension Musculoskeletal Patient has history of Osteoarthritis Medical A Surgical History Notes nd Respiratory Hx of PE Integumentary (Skin) Keloid to Right Neck Psychiatric depression [name], [name] ([id number]) [id number] Page 5 of 7 Objective Constitutional respirations regular, non-labored and within target range for patient.. Vitals Time Taken: 12:45 PM, Height: 72 in, Weight: 327 lbs, BMI: 44.3, Temperature: 98 F, Pulse: 101 bpm, Respiratory Rate: 20 breaths/min, Blood Pressure: 143/81 mmHg. Cardiovascular 2+ dorsalis pedis/posterior tibialis pulses. Psychiatric pleasant and cooperative. General Notes: T the left lateral leg there is a small circular open wound with granulation tissue. No signs of surrounding infection. Stage IV lymphedema o Integumentary (Hair, Skin) Wound #4 status is Open. Original cause of wound was Gradually Appeared. The date acquired was: 04/26/2022. The wound has been in treatment 1 weeks. The wound is located on the Left,Lateral Lower Leg. The wound measures 0.1cm length x 0.1cm width x 0.1cm depth;  0.008cm^2 area and 0.001cm^3 volume. There is no tunneling or undermining noted. There is a none present amount of drainage noted. The wound margin is distinct with the outline attached to the wound base. There is large (67-100%) pink, pale granulation within the wound bed. There is no necrotic tissue within the wound bed. The periwound skin appearance exhibited: Scarring, Dry/Scaly, Hemosiderin Staining. The periwound skin appearance did not exhibit: Callus, Crepitus, Excoriation, Induration, Rash, Maceration, Atrophie Blanche, Cyanosis, Ecchymosis, Mottled, Pallor, Rubor, Erythema. Assessment Active Problems ICD-10 Non-pressure chronic ulcer of other part  of left lower leg with fat layer exposed Lymphedema, not elsewhere classified Patient's wound is well-healing. I recommended continuing the course with silver alginate and antibiotic ointment under 4-layer compression. Follow-up in 1 week For nurse visit and following with with physician. Procedures Wound #4 Pre-procedure diagnosis of Wound #4 is a Lymphedema located on the Left,Lateral Lower Leg . There was a Four Layer Compression Therapy Procedure by Deon Pilling, RN. Post procedure Diagnosis Wound #4: Same as Pre-Procedure Plan Follow-up Appointments: Return Appointment in 2 weeks. - 1415 06/05/2022 Monday room 8 Dr. Heber [place] Nurse Visit: - Monday 05/29/2022 1230 room 8 Bathing/ Shower/ Hygiene: May shower with protection but do not get wound dressing(s) wet. Protect dressing(s) with water repellant cover (for example, large plastic bag) or a cast cover and may then take shower. Edema Control - Lymphedema / SCD / Other: Lymphedema Pumps. Use Lymphedema pumps on leg(s) 2-3 times a day for 45-60 minutes. If wearing any wraps or hose, do not remove them. Continue exercising as instructed. Elevate legs to the level of the heart or above for 30 minutes daily and/or when sitting for 3-4 times a day throughout the day. Avoid standing for long  periods of time. Exercise regularly WOUND #4: - Lower Leg Wound Laterality: Left, Lateral Cleanser: Vashe 5.8 (oz) 1 x Per Week/30 Days Discharge Instructions: Cleanse the wound with Vashe prior to applying a clean dressing using gauze sponges, not tissue or cotton balls. Peri-Wound Care: Sween Lotion (Moisturizing lotion) 1 x Per Week/30 Days Discharge Instructions: Apply moisturizing lotion as directed Topical: Gentamicin 1 x Per Week/30 Days Discharge Instructions: As directed by physician Topical: Mupirocin Ointment 1 x Per Week/30 Days Discharge Instructions: Apply Mupirocin (Bactroban) as instructed Prim Dressing: Maxorb Extra Ag+ Alginate Dressing, 2x2 (in/in) 1 x Per Week/30 Days ary Discharge Instructions: Apply to wound bed as instructed Secondary Dressing: ABD Pad, 8x10 1 x Per Week/30 Days Discharge Instructions: Apply over primary dressing as directed. Com pression Wrap: FourPress (4 layer compression wrap) 1 x Per Week/30 Days [name], [name] ([id number]) [phone].pdf Page 6 of 7 Discharge Instructions: Apply four layer compression as directed. May also use Urgo K2 compression system as alternative. Compression Wrap: unna boot 1 x Per Week/30 Days Discharge Instructions: first layer to upper portion of lower leg. 1. Silver alginate with antibiotic ointment under 4-layer compressionooleft lower extremity 2. Follow-up in 1 week Electronic Signature(s) Signed: 05/22/2022 2:44:54 PM By: Kalman Shan DO Entered By: Kalman Shan on 05/22/2022 13:28:37 -------------------------------------------------------------------------------- HxROS Details Patient Name: Date of Service: [name], SPATOLA 05/22/2022 12:30 PM Medical Record Number: [id number] Patient Account Number: [id number] Date of Birth/Sex: Treating RN: 1965/10/23 (57 y.o. F) Primary Care Provider: [name] Other Clinician: Referring Provider: Treating Provider/Extender:  Mack Hook Weeks in Treatment: 1 Information Obtained From Patient Chart Eyes Medical History: Positive for: Cataracts Hematologic/Lymphatic Medical History: Positive for: Anemia; Lymphedema - Since 2019 Respiratory Medical History: Positive for: Asthma Past Medical History Notes: Hx of PE Cardiovascular Medical History: Positive for: Deep Vein Thrombosis; Hypertension Integumentary (Skin) Medical History: Past Medical History Notes: Keloid to Right Neck Musculoskeletal Medical History: Positive for: Osteoarthritis Psychiatric Medical History: Past Medical History Notes: depression HBO Extended History Items Eyes: Cataracts Immunizations Pneumococcal Vaccine: Received Pneumococcal Vaccination: Yes Received Pneumococcal Vaccination On or After 60th BirthdayFATMA, RYSER ([id number]) [phone].pdf Page 7 of 7 Implantable Devices None Family and Social History Cancer: Yes - Father; Diabetes: Yes - Maternal Grandparents; Heart Disease: Yes - Mother,Siblings; Hereditary Spherocytosis:  No; Hypertension: Yes - Mother,Siblings; Kidney Disease: No; Lung Disease: Yes - Father; Seizures: No; Stroke: No; Thyroid Problems: No; Tuberculosis: No; Never smoker; Marital Status - Single; Alcohol Use: Rarely; Drug Use: No History; Caffeine Use: Daily - T Financial Concerns: No; Food, Clothing or Shelter Needs: No; Support ea; System Lacking: No; Transportation Concerns: No Electronic Signature(s) Signed: 05/22/2022 2:44:54 PM By: Kalman Shan DO Entered By: Kalman Shan on 05/22/2022 13:27:26 -------------------------------------------------------------------------------- SuperBill Details Patient Name: Date of Service: [name] 05/22/2022 Medical Record Number: [id number] Patient Account Number: [id number] Date of Birth/Sex: Treating RN: [birth year] (56 y.o. [name] Primary Care Provider: [name] Other Clinician: Referring Provider: Treating Provider/Extender: Mack Hook Weeks in Treatment: 1 Diagnosis Coding ICD-10 Codes Code Description [phone] Non-pressure chronic ulcer of other part of left lower leg with fat layer exposed I89.0 Lymphedema, not elsewhere classified Facility Procedures : CPT4 Code: [id number] Description: (Facility Use Only) [phone] - [place] LWR LT LEG Modifier: Quantity: 1 Physician Procedures : CPT4 Code Description Modifier [id number] - WC PHYS LEVEL 3 - EST PT ICD-10 Diagnosis Description L97.822 Non-pressure chronic ulcer of other part of left lower leg with fat layer exposed I89.0 Lymphedema, not elsewhere classified Quantity: 1 Electronic Signature(s) Signed: 05/22/2022 2:44:54 PM By: Kalman Shan DO Entered By: Kalman Shan on 05/22/2022 13:29:12

## 2022-05-23 ENCOUNTER — Ambulatory Visit: Payer: Medicare HMO | Admitting: Occupational Therapy

## 2022-05-24 ENCOUNTER — Encounter: Payer: Medicare HMO | Admitting: Occupational Therapy

## 2022-05-25 ENCOUNTER — Ambulatory Visit: Payer: Medicare HMO | Admitting: Occupational Therapy

## 2022-05-26 ENCOUNTER — Encounter: Payer: Self-pay | Admitting: Nurse Practitioner

## 2022-05-26 ENCOUNTER — Other Ambulatory Visit: Payer: Self-pay | Admitting: Physician Assistant

## 2022-05-26 DIAGNOSIS — Z86711 Personal history of pulmonary embolism: Secondary | ICD-10-CM

## 2022-05-27 ENCOUNTER — Other Ambulatory Visit: Payer: Self-pay | Admitting: Nurse Practitioner

## 2022-05-27 DIAGNOSIS — Z86711 Personal history of pulmonary embolism: Secondary | ICD-10-CM

## 2022-05-27 MED ORDER — APIXABAN 5 MG PO TABS: 5.0000 mg | ORAL_TABLET | Freq: Two times a day (BID) | ORAL | 0 refills | Status: DC

## 2022-05-29 ENCOUNTER — Encounter (HOSPITAL_BASED_OUTPATIENT_CLINIC_OR_DEPARTMENT_OTHER): Payer: Medicare HMO | Attending: General Surgery | Admitting: General Surgery

## 2022-05-29 DIAGNOSIS — Z09 Encounter for follow-up examination after completed treatment for conditions other than malignant neoplasm: Secondary | ICD-10-CM | POA: Insufficient documentation

## 2022-05-29 DIAGNOSIS — I890 Lymphedema, not elsewhere classified: Secondary | ICD-10-CM | POA: Diagnosis not present

## 2022-05-29 DIAGNOSIS — L97822 Non-pressure chronic ulcer of other part of left lower leg with fat layer exposed: Secondary | ICD-10-CM | POA: Diagnosis not present

## 2022-05-30 ENCOUNTER — Ambulatory Visit: Payer: Medicare HMO | Admitting: Occupational Therapy

## 2022-05-30 NOTE — Progress Notes (Signed)
JODETTE, BRANDWEIN ([id number]) [phone].pdf Page 1 of 1 Visit Report for 05/29/2022 SuperBill Details Patient Name: Date of Service: [name], [name] 05/29/2022 Medical Record Number: [id number] Patient Account Number: [id number] Date of Birth/Sex: Treating RN: 1965-12-21 (57 y.o. [name] Primary Care Provider: [name] Other Clinician: Referring Provider: Treating Provider/Extender: Micki Riley Weeks in Treatment: 2 Diagnosis Coding ICD-10 Codes Code Description [phone] Non-pressure chronic ulcer of other part of left lower leg with fat layer exposed I89.0 Lymphedema, not elsewhere classified Facility Procedures CPT4 Code Description Modifier Quantity [id number] (Facility Use Only) [phone] - APPLY MULTLAY COMPRS LWR LT LEG 1 Electronic Signature(s) Signed: 05/29/2022 1:27:39 PM By: Fredirick Maudlin MD FACS Signed: 05/30/2022 4:09:50 PM By: Deon Pilling RN, BSN Entered By: Deon Pilling on 05/29/2022 12:57:59

## 2022-05-30 NOTE — Progress Notes (Signed)
[name], [name] ([id number]) [id number] Page 1 of 3 Visit Report for 05/29/2022 Arrival Information Details Patient Name: Date of Service: [name], [name] 05/29/2022 12:30 PM Medical Record Number: [id number] Patient Account Number: [id number] Date of Birth/Sex: Treating RN: [birth year] (57 y.o. [name], [name] Primary Care [name]: [name] Other Clinician: Referring [name]: Treating [name]/Extender: [name] in Treatment: 2 Visit Information History Since Last Visit Added or deleted any medications: No Patient Arrived: [name] Any new allergies or adverse reactions: No Arrival Time: 12:35 Had a fall or experienced change in No Accompanied By: self activities of daily living that may affect Transfer Assistance: None risk of falls: Patient Identification Verified: Yes Signs or symptoms of abuse/neglect since last visito No Secondary Verification Process Completed: Yes Hospitalized since last visit: No Patient Requires Transmission-Based Precautions: No Implantable device outside of the clinic excluding No Patient Has Alerts: No cellular tissue based products placed in the center since last visit: Has Dressing in Place as Prescribed: Yes Has Compression in Place as Prescribed: Yes Pain Present Now: No Electronic Signature(s) Signed: 05/30/2022 4:09:50 PM By: Lauren Pilling RN, BSN Entered By: [name] on 05/29/2022 12:55:41 -------------------------------------------------------------------------------- Compression Therapy Details Patient Name: Date of Service: [name] 05/29/2022 12:30 PM Medical Record Number: [id number] Patient Account Number: [id number] Date of Birth/Sex: Treating RN: 07-27-1965 (57 y.o. [name] Primary Care [name]: [name] Other Clinician: Referring [name]: Treating [name]/Extender: [name] in Treatment: 2 Compression  Therapy Performed for Wound Assessment: Wound #4 Left,Lateral Lower Leg Performed By: Clinician Lauren Pilling, RN Compression Type: Double Layer Electronic Signature(s) Signed: 05/30/2022 4:09:50 PM By: Lauren Pilling RN, BSN Entered By: [name] on 05/29/2022 12:56:04 -------------------------------------------------------------------------------- Encounter Discharge Information Details Patient Name: Date of Service: [name], [name] 05/29/2022 12:30 PM Medical Record Number: [id number] Patient Account Number: [id number] Date of Birth/Sex: Treating RN: 03-Jul-1965 (57 y.o. [name], [name] Primary Care Ruslan Mccabe: [name] Other Clinician: Referring [name]: Treating [name]/Extender: [name] in Treatment: 2 Encounter Discharge Information Items Discharge Condition: Stable Ambulatory Status: Walker Discharge Destination: Home Transportation: Private Auto Accompanied By: self Schedule Follow-up Appointment: Yes Clinical Summary of Care: [name], [name] ([id number] Page 2 of 3 Electronic Signature(s) Signed: 05/30/2022 4:09:50 PM By: Lauren Pilling RN, BSN Entered By: [name] on 05/29/2022 12:57:34 -------------------------------------------------------------------------------- Wound Assessment Details Patient Name: Date of Service: [name], [name] 05/29/2022 12:30 PM Medical Record Number: [id number] Patient Account Number: [id number] Date of Birth/Sex: Treating RN: 1965-04-14 (57 y.o. [name], [name] Primary Care [name]: [name] Other Clinician: Referring [name]: Treating [name]/Extender: [name] in Treatment: 2 Wound Status Wound Number: 4 Primary Etiology: Lymphedema Wound Location: Left, Lateral Lower Leg Wound Status: Open Wounding Event: Gradually Appeared Date Acquired: 04/26/2022 [name] Of Treatment: 2 Clustered Wound: No Wound  Measurements Length: (cm) 0.1 Width: (cm) 0.1 Depth: (cm) 0.1 Area: (cm) 0.008 Volume: (cm) 0.001 % Reduction in Area: 95.9% % Reduction in Volume: 95% Wound Description Classification: Full Thickness Without Exposed Support S Exudate Amount: None Present tructures Periwound Skin Texture Texture Color No Abnormalities Noted: No No Abnormalities Noted: No Moisture No Abnormalities Noted: No Treatment Notes Wound #4 (Lower Leg) Wound Laterality: Left, Lateral Cleanser Vashe 5.8 (oz) Discharge Instruction: Cleanse the wound with Vashe prior to applying a clean dressing using gauze sponges, not tissue or cotton balls. Peri-Wound Care Sween Lotion (Moisturizing lotion) Discharge Instruction: Apply moisturizing lotion as directed Topical Gentamicin Discharge Instruction: As directed  by physician Mupirocin Ointment Discharge Instruction: Apply Mupirocin (Bactroban) as instructed Primary Dressing Maxorb Extra Ag+ Alginate Dressing, 2x2 (in/in) Discharge Instruction: Apply to wound bed as instructed Secondary Dressing ABD Pad, 8x10 Discharge Instruction: Apply over primary dressing as directed. Secured With Compression Wrap FourPress (4 layer compression wrap) [name] ([id number]) [id number] Page 3 of 3 Discharge Instruction: Apply four layer compression as directed. May also use Urgo K2 compression system as alternative. Compression Stockings Add-Ons Electronic Signature(s) Signed: 05/30/2022 4:09:50 PM By: Lauren Pilling RN, BSN Entered By: [name] on 05/29/2022 12:55:53

## 2022-05-31 ENCOUNTER — Encounter: Payer: Medicare HMO | Admitting: Occupational Therapy

## 2022-06-01 ENCOUNTER — Encounter: Payer: Medicare HMO | Admitting: Occupational Therapy

## 2022-06-05 ENCOUNTER — Encounter (HOSPITAL_BASED_OUTPATIENT_CLINIC_OR_DEPARTMENT_OTHER): Payer: Medicare HMO | Admitting: Internal Medicine

## 2022-06-05 DIAGNOSIS — L97822 Non-pressure chronic ulcer of other part of left lower leg with fat layer exposed: Secondary | ICD-10-CM

## 2022-06-05 DIAGNOSIS — I890 Lymphedema, not elsewhere classified: Secondary | ICD-10-CM | POA: Diagnosis not present

## 2022-06-05 DIAGNOSIS — Z09 Encounter for follow-up examination after completed treatment for conditions other than malignant neoplasm: Secondary | ICD-10-CM | POA: Diagnosis not present

## 2022-06-05 NOTE — Progress Notes (Signed)
[name], [name] ([id number]) [id number] Page 1 of 7 Visit Report for 06/05/2022 Chief Complaint Document Details Patient Name: Date of Service: [name], [name]. 06/05/2022 2:15 PM Medical Record Number: [id number] Patient Account Number: [id number] Date of Birth/Sex: Treating RN: 04-17-1965 (57 y.o. F) Primary Care Provider: Bertram DenverFleming, Lauren Other Clinician: Referring Provider: Treating Provider/Extender: Lauren DillingHoffman, [name], Lauren Weeks in Treatment: 3 Information Obtained from: Patient Chief Complaint 05/26/2021; Left lower extremity wound Electronic Signature(s) Signed: 06/05/2022 4:03:08 PM By: Lauren CorwinHoffman, Lauren Merrick DO Entered By: Lauren CorwinHoffman, Lauren Rybacki on 06/05/2022 14:59:48 -------------------------------------------------------------------------------- HPI Details Patient Name: Date of Service: [name], [name]. 06/05/2022 2:15 PM Medical Record Number: [id number] Patient Account Number: [id number] Date of Birth/Sex: Treating RN: 04-17-1965 (57 y.o. F) Primary Care Provider: Bertram DenverFleming, Lauren Other Clinician: Referring Provider: Treating Provider/Extender: Lauren DillingHoffman, Domenic Schoenberger [name], Lauren Weeks in Treatment: 3 History of Present Illness HPI Description: Admission 05/26/2021 Ms. [name] is a 57 year old female with a past medical history of lymphedema That presents to the clinic for a 1779-month history of nonhealing ulcer to the posterior left leg. She has not been using any dressing to the wound bed. She has never experienced wounds before. She states that she sleeps in a recliner with her feet down. She has lymphedema pumps however has not been using them due to the wound and the increased swelling to her legs. She currently denies signs of infection. 4/4; patient presents for follow-up. She states that the wrap slid down after a couple days. She states she tolerated it well prior to her coming loose. She denies signs of infection. 4/11 comes in today  with a substantial wound on her left posterior calf. This is superficial in the setting of severe lymphedema. We have been using 3 layer compression. She had a PCR culture done last week which showed extremely high titers of Morganella but also high titers of Enterobacter and [name]. [facility] of a topical antibiotic spray The patient is not systemically unwell no fever no chills. Our intake nurse noted very significant odor however 4/18; patient presents for follow-up. She was able to obtain her [facility] and brought this in today. She was started on ciprofloxacin at last clinic visit but states this caused her nausea and vomiting and she stopped. She denies systemic signs of infection. She tolerated the 4-layer compression wrap well. She has no issues or complaints today. 4/25; patient presents for follow-up. She has been tolerating the 4-layer compression wrap well to the left lower extremity. Unfortunately she has a new wound to the right lower extremity. 5/2; patient presents for follow-up. She has no issues or complaints today. She tolerated the 4-layer compression wraps well. 5/9; patient presents for follow-up. She states she has an appointment to get her [facility] She has no issues or complaints today. Readmission 10/18/2021 Patient states that 1-2 month ago she noticed some drainage from her posterior left leg. She been keeping the area covered. She has lymphedema pumps but states she does not use these daily. She [facility] to have custom compression garments made. Due to cost she is unable to obtain these. She currently denies signs of infection. 8/29; patient presents for follow-up. She did well with silver alginate under 4-layer compression. Her custom compression garments are available for her to pick up. However due to financial cost she may not be able to afford these. Readmission  05/10/2022 Ms. [name] is a 57 year old  female with a past medical history of lymphedema that presents the clinic for a 2-week history of open wound to the left lateral leg. The area occurred spontaneously. She Follows in the lymphedema clinic and gets wrapped twice weekly. She has had a good experience there so far. She denies signs of infection. She has been seen in our clinic before for this issue. She does well with 4-layer compression wraps. 3/25; patient missed her last clinic follow-up Due to a death in the family. She has had the wrap on for more than 2 weeks. She knows she is not supposed to have this on for more than 7 days. We have been using silver alginate with antibiotic ointment under 4-layer compression. She has no issues or complaints today. [name], [name] ([id number]) [phone].pdf Page 2 of 7 4/8; patient presents for follow-up. We have been using silver alginate under 4-layer compression to the left lower extremity. Her wound is healed. Electronic Signature(s) Signed: 06/05/2022 4:03:08 PM By: Lauren Corwin DO Entered By: Lauren Corwin on 06/05/2022 15:00:13 -------------------------------------------------------------------------------- Physical Exam Details Patient Name: Date of Service: [name], [name] 06/05/2022 2:15 PM Medical Record Number: [id number] Patient Account Number: [id number] Date of Birth/Sex: Treating RN: 25-Sep-1965 (57 y.o. F) Primary Care Provider: [name] Other Clinician: Referring Provider: Treating Provider/Extender: Lauren Dilling Weeks in Treatment: 3 Constitutional respirations regular, non-labored and within target range for patient.. Cardiovascular 2+ dorsalis pedis/posterior tibialis pulses. Psychiatric pleasant and cooperative. Notes T the left lateral leg there is epithelization to the previous wound site. Evidence of stage IV lymphedema. o Electronic  Signature(s) Signed: 06/05/2022 4:03:08 PM By: Lauren Corwin DO Entered By: Lauren Corwin on 06/05/2022 15:00:45 -------------------------------------------------------------------------------- Physician Orders Details Patient Name: Date of Service: [name], [name] 06/05/2022 2:15 PM Medical Record Number: [id number] Patient Account Number: [id number] Date of Birth/Sex: Treating RN: December 24, 1965 (57 y.o. Arta Silence Primary Care Provider: [name] Other Clinician: Referring Provider: Treating Provider/Extender: Lauren Dilling Weeks in Treatment: 3 Verbal / Phone Orders: No Diagnosis Coding ICD-10 Coding Code Description [phone] Non-pressure chronic ulcer of other part of left lower leg with fat layer exposed I89.0 Lymphedema, not elsewhere classified Discharge From [facility] Services Discharge from Wound Care Center - Call if any future wound care needs. Patient to wear compression garments. Follow up with lymphedema clinic in [place]. Services and Therapies lymphedema clinic - Referral to lymphedema clinic in [place]. - (ICD10 I89.0 - Lymphedema, not elsewhere classified) Electronic Signature(s) Signed: 06/05/2022 4:03:08 PM By: Lauren Corwin DO Entered By: Lauren Corwin on 06/05/2022 15:00:59 Lauren Larsen ([id number]) [id number] Page 3 of 7 Prescription 06/05/2022 -------------------------------------------------------------------------------- Roxy Manns DO Patient Name: Provider: Apr 24, 1965 [id number] Date of Birth: NPI#: [id number] Sex: DEA #: [phone] Phone #: License #: Eligha Bridegroom [facility] Patient Address: [address] [place], [place] [address] [address] [place], [place] [address] [phone] Allergies abatacept; bee venom protein (honey bee); latex; Augmentin; Nulytely; Cipro Provider's Orders lymphedema  clinic - ICD10: I89.0 - Referral to lymphedema clinic in [place]. Hand Signature: Date(s): Electronic Signature(s) Signed: 06/05/2022 4:03:08 PM By: Lauren Corwin DO Entered By: Lauren Corwin on 06/05/2022 15:00:59 -------------------------------------------------------------------------------- Problem List Details Patient Name: Date of Service: Lauren Larsen 06/05/2022 2:15 PM Medical Record Number: [id number] Patient Account Number: [id number] Date of Birth/Sex: Treating RN: [birth year] (57 y.o. Arta Silence Primary Care Provider: [name] Other Clinician: Referring Provider: Treating Provider/Extender: Crist Fat, Shon Hale Weeks in  Treatment: 3 Active Problems ICD-10 Encounter Code Description Active Date MDM Diagnosis L97.822 Non-pressure chronic ulcer of other part of left lower leg with fat layer exposed3/13/2024 No Yes I89.0 Lymphedema, not elsewhere classified 05/10/2022 No Yes Inactive Problems Resolved Problems Electronic Signature(s) Signed: 06/05/2022 4:03:08 PM By: Lauren Corwin DO Entered By: Lauren Corwin on 06/05/2022 14:59:30 -------------------------------------------------------------------------------- Progress Note Details Patient Name: Date of Service: Lauren Larsen 06/05/2022 2:15 PM Medical Record Number: [id number] Patient Account Number: [id number] Date of Birth/Sex: Treating RN: [birth year] (57 y.o. F) Primary Care Provider: [name] Other Clinician: Referring Provider: Treating Provider/Extender: Benjamin Stain in Treatment: 3 KABRINA, CHRISTIANO [name] ([id number]) [id number] Page 4 of 7 Subjective Chief Complaint Information obtained from Patient 05/26/2021; Left lower extremity wound History of Present Illness (HPI) Admission 05/26/2021 [name] is a 57 year old female with a past medical history of lymphedema That presents to the clinic for a  50-month history of nonhealing ulcer to the posterior left leg. She has not been using any dressing to the wound bed. She has never experienced wounds before. She states that she sleeps in a recliner with her feet down. She has lymphedema pumps however has not been using them due to the wound and the increased swelling to her legs. She currently denies signs of infection. 4/4; patient presents for follow-up. She states that the wrap slid down after a couple days. She states she tolerated it well prior to her coming loose. She denies signs of infection. 4/11 comes in today with a substantial wound on her left posterior calf. This is superficial in the setting of severe lymphedema. We have been using 3 layer compression. She had a PCR culture done last week which showed extremely high titers of Morganella but also high titers of Enterobacter and [name]. We have forward the culture to [facility] for preparation of a topical antibiotic spray The patient is not systemically unwell no fever no chills. Our intake nurse noted very significant odor however 4/18; patient presents for follow-up. She was able to obtain her [facility] antibiotics and brought this in today. She was started on ciprofloxacin at last clinic visit but states this caused her nausea and vomiting and she stopped. She denies systemic signs of infection. She tolerated the 4-layer compression wrap well. She has no issues or complaints today. 4/25; patient presents for follow-up. She has been tolerating the 4-layer compression wrap well to the left lower extremity. Unfortunately she has a new wound to the right lower extremity. 5/2; patient presents for follow-up. She has no issues or complaints today. She tolerated the 4-layer compression wraps well. 5/9; patient presents for follow-up. She states she has an appointment to get her stockings fitted [facility] She has no issues or complaints today. Readmission 10/18/2021 Patient  states that 1-2 month ago she noticed some drainage from her posterior left leg. She been keeping the area covered. She has lymphedema pumps but states she does not use these daily. She did go to [facility] to have custom compression garments made. Due to cost she is unable to obtain these. She currently denies signs of infection. 8/29; patient presents for follow-up. She did well with silver alginate under 4-layer compression. Her custom compression garments are available for her to pick up. However due to financial cost she may not be able to afford these. Readmission 05/10/2022 Ms. [name] is a 57 year old female with a past medical history of lymphedema that presents the clinic for  a 2-week history of open wound to the left lateral leg. The area occurred spontaneously. She Follows in the lymphedema clinic and gets wrapped twice weekly. She has had a good experience there so far. She denies signs of infection. She has been seen in our clinic before for this issue. She does well with 4-layer compression wraps. 3/25; patient missed her last clinic follow-up Due to a death in the family. She has had the wrap on for more than 2 weeks. She knows she is not supposed to have this on for more than 7 days. We have been using silver alginate with antibiotic ointment under 4-layer compression. She has no issues or complaints today. 4/8; patient presents for follow-up. We have been using silver alginate under 4-layer compression to the left lower extremity. Her wound is healed. Patient History Information obtained from Patient, Chart. Family History Cancer - Father, Diabetes - Maternal Grandparents, Heart Disease - Mother,Siblings, Hypertension - Mother,Siblings, Lung Disease - Father, No family history of Hereditary Spherocytosis, Kidney Disease, Seizures, Stroke, Thyroid Problems, Tuberculosis. Social History Never smoker, Marital Status - Single, Alcohol Use - Rarely, Drug Use - No  History, Caffeine Use - Daily - Tea. Medical History Eyes Patient has history of Cataracts Hematologic/Lymphatic Patient has history of Anemia, Lymphedema - Since 2019 Respiratory Patient has history of Asthma Cardiovascular Patient has history of Deep Vein Thrombosis, Hypertension Musculoskeletal Patient has history of Osteoarthritis Medical A Surgical History Notes nd Respiratory Hx of PE Integumentary (Skin) Keloid to Right Neck Psychiatric depression [name], [name] ([id number]) [phone].pdf Page 5 of 7 Objective Constitutional respirations regular, non-labored and within target range for patient.. Vitals Time Taken: 2:46 PM, Height: 72 in, Weight: 327 lbs, BMI: 44.3, Temperature: 98.5 F, Pulse: 89 bpm, Respiratory Rate: 20 breaths/min, Blood Pressure: 143/76 mmHg. Cardiovascular 2+ dorsalis pedis/posterior tibialis pulses. Psychiatric pleasant and cooperative. General Notes: T the left lateral leg there is epithelization to the previous wound site. Evidence of stage IV lymphedema. o Integumentary (Hair, Skin) Wound #4 status is Healed - Epithelialized. Original cause of wound was Gradually Appeared. The date acquired was: 04/26/2022. The wound has been in treatment 3 weeks. The wound is located on the Left,Lateral Lower Leg. The wound measures 0cm length x 0cm width x 0cm depth; 0cm^2 area and 0cm^3 volume. There is no tunneling or undermining noted. There is a none present amount of drainage noted. The wound margin is distinct with the outline attached to the wound base. There is no granulation within the wound bed. There is no necrotic tissue within the wound bed. The periwound skin appearance exhibited: Hemosiderin Staining. The periwound skin appearance did not exhibit: Callus, Crepitus, Excoriation, Induration, Rash, Scarring, Dry/Scaly, Maceration, Atrophie Blanche, Cyanosis, Ecchymosis, Mottled, Pallor, Rubor,  Erythema. Assessment Active Problems ICD-10 Non-pressure chronic ulcer of other part of left lower leg with fat layer exposed Lymphedema, not elsewhere classified Patient has done well with silver alginate under compression therapy. Her wound is healed. I recommended she wear her compression garments daily. She follows with the lymphedema clinic and she will resume this. We will refer her back. Plan Discharge From [facility] Services: Discharge from Wound Care Center - Call if any future wound care needs. Patient to wear compression garments. Follow up with lymphedema clinic in [place]. Services and Therapies ordered were: lymphedema clinic - Referral to lymphedema clinic in [place]. 1. Discharge from clinic due to closed wound 2. Wear compression garments daily 3. Follow-up as needed 4. Refer back to lymphedema neck in [facility]  Electronic Signature(s) Signed: 06/05/2022 4:03:08 PM By: Lauren Corwin DO Entered By: Lauren Corwin on 06/05/2022 15:01:41 -------------------------------------------------------------------------------- HxROS Details Patient Name: Date of Service: Lauren Larsen 06/05/2022 2:15 PM Medical Record Number: [id number] Patient Account Number: [id number] Date of Birth/Sex: Treating RN: [birth year] (57 y.o. F) Primary Care Provider: [name] Other Clinician: Referring Provider: Treating Provider/Extender: Benjamin Stain in Treatment: 3 [name], [name] ([id number]) [id number] Page 6 of 7 Information Obtained From Patient Chart Eyes Medical History: Positive for: Cataracts Hematologic/Lymphatic Medical History: Positive for: Anemia; Lymphedema - Since 2019 Respiratory Medical History: Positive for: Asthma Past Medical History Notes: Hx of PE Cardiovascular Medical History: Positive for: Deep Vein Thrombosis; Hypertension Integumentary (Skin) Medical History: Past Medical History  Notes: Keloid to Right Neck Musculoskeletal Medical History: Positive for: Osteoarthritis Psychiatric Medical History: Past Medical History Notes: depression HBO Extended History Items Eyes: Cataracts Immunizations Pneumococcal Vaccine: Received Pneumococcal Vaccination: Yes Received Pneumococcal Vaccination On or After 60th Birthday: No Implantable Devices None Family and Social History Cancer: Yes - Father; Diabetes: Yes - Maternal Grandparents; Heart Disease: Yes - Mother,Siblings; Hereditary Spherocytosis: No; Hypertension: Yes - Mother,Siblings; Kidney Disease: No; Lung Disease: Yes - Father; Seizures: No; Stroke: No; Thyroid Problems: No; Tuberculosis: No; Never smoker; Marital Status - Single; Alcohol Use: Rarely; Drug Use: No History; Caffeine Use: Daily - T Financial Concerns: No; Food, Clothing or Shelter Needs: No; Support ea; System Lacking: No; Transportation Concerns: No Electronic Signature(s) Signed: 06/05/2022 4:03:08 PM By: Lauren Corwin DO Entered By: Lauren Corwin on 06/05/2022 15:00:17 -------------------------------------------------------------------------------- SuperBill Details Patient Name: Date of Service: Lauren Larsen 06/05/2022 Medical Record Number: [id number] Patient Account Number: [id number] Date of Birth/Sex: Treating RN: 04/06/1965 (57 y.o. F) Primary Care Provider: [name] Other Clinician: Referring Provider: Treating Provider/Extender: Benjamin Stain in Treatment: 3 [name], [name] ([id number]) [id number] Page 7 of 7 Diagnosis Coding ICD-10 Codes Code Description [phone] Non-pressure chronic ulcer of other part of left lower leg with fat layer exposed I89.0 Lymphedema, not elsewhere classified Facility Procedures : CPT4 Code: [id number] Description: 99213 - WOUND CARE VISIT-LEV 3 EST PT Modifier: Quantity: 1 Physician Procedures : CPT4 Code Description Modifier  [id number] 99213 - WC PHYS LEVEL 3 - EST PT ICD-10 Diagnosis Description L97.822 Non-pressure chronic ulcer of other part of left lower leg with fat layer exposed I89.0 Lymphedema, not elsewhere classified Quantity: 1 Electronic Signature(s) Signed: 06/05/2022 4:03:08 PM By: Lauren Corwin DO Signed: 06/05/2022 5:03:18 PM By: Shawn Stall RN, BSN Entered By: Shawn Stall on 06/05/2022 15:20:46

## 2022-06-05 NOTE — Progress Notes (Signed)
CANYA, UMBEL ([id number]) [id number] Page 1 of 7 Visit Report for 06/05/2022 Arrival Information Details Patient Name: Date of Service: [name], [name] 06/05/2022 2:15 PM Medical Record Number: [id number] Patient Account Number: [id number] Date of Birth/Sex: Treating RN: [birth year] (57 y.o. [name], Millard.Loa Primary Care Miyonna Ormiston: [name] Other Clinician: Referring Dewitte Vannice: Treating Amiere Cawley/Extender: Altamese Dilling Weeks in Treatment: 3 Visit Information History Since Last Visit Added or deleted any medications: No Patient Arrived: [name] Any new allergies or adverse reactions: No Arrival Time: 14:41 Had a fall or experienced change in No Accompanied By: self activities of daily living that may affect Transfer Assistance: None risk of falls: Patient Identification Verified: Yes Signs or symptoms of abuse/neglect since last visito No Secondary Verification Process Completed: Yes Hospitalized since last visit: No Patient Requires Transmission-Based Precautions: No Implantable device outside of the clinic excluding No Patient Has Alerts: No cellular tissue based products placed in the center since last visit: Has Dressing in Place as Prescribed: Yes Has Compression in Place as Prescribed: Yes Pain Present Now: No Electronic Signature(s) Signed: 06/05/2022 5:03:18 PM By: Shawn Stall RN, BSN Entered By: Shawn Stall on 06/05/2022 14:41:29 -------------------------------------------------------------------------------- Clinic Level of Care Assessment Details Patient Name: Date of Service: [name], [name] 06/05/2022 2:15 PM Medical Record Number: [id number] Patient Account Number: [id number] Date of Birth/Sex: Treating RN: 1966/02/24 (57 y.o. [name], Yvonne Kendall Primary Care Joffrey Kerce: [name] Other Clinician: Referring Keath Matera: Treating Ruhee Enck/Extender: Altamese Dilling Weeks in Treatment:  3 Clinic Level of Care Assessment Items TOOL 4 Quantity Score X- 1 0 Use when only an EandM is performed on FOLLOW-UP visit ASSESSMENTS - Nursing Assessment / Reassessment X- 1 10 Reassessment of Co-morbidities (includes updates in patient status) X- 1 5 Reassessment of Adherence to Treatment Plan ASSESSMENTS - Wound and Skin A ssessment / Reassessment X - Simple Wound Assessment / Reassessment - one wound 1 5 []  - 0 Complex Wound Assessment / Reassessment - multiple wounds X- 1 10 Dermatologic / Skin Assessment (not related to wound area) ASSESSMENTS - Focused Assessment X- 1 5 Circumferential Edema Measurements - multi extremities []  - 0 Nutritional Assessment / Counseling / Intervention []  - 0 Lower Extremity Assessment (monofilament, tuning fork, pulses) []  - 0 Peripheral Arterial Disease Assessment (using hand held doppler) ASSESSMENTS - Ostomy and/or Continence Assessment and Care []  - 0 Incontinence Assessment and Management []  - 0 Ostomy Care Assessment and Management (repouching, etc.) PROCESS - Coordination of Care AERALYN, [place] ([id number]) [id number] Page 2 of 7 X- 1 15 Simple Patient / Family Education for ongoing care []  - 0 Complex (extensive) Patient / Family Education for ongoing care X- 1 10 Staff obtains Chiropractor, Records, T Results / Process Orders est []  - 0 Staff telephones HHA, Nursing Homes / Clarify orders / etc []  - 0 Routine Transfer to another Facility (non-emergent condition) []  - 0 Routine Hospital Admission (non-emergent condition) []  - 0 New Admissions / Manufacturing engineer / Ordering NPWT Apligraf, etc. , []  - 0 Emergency Hospital Admission (emergent condition) X- 1 10 Simple Discharge Coordination []  - 0 Complex (extensive) Discharge Coordination PROCESS - Special Needs []  - 0 Pediatric / Minor Patient Management []  - 0 Isolation Patient Management []  - 0 Hearing / Language / Visual  special needs []  - 0 Assessment of Community assistance (transportation, D/C planning, etc.) []  - 0 Additional assistance / Altered mentation []  - 0 Support Surface(s) Assessment (bed, cushion, seat, etc.) INTERVENTIONS - Wound Cleansing / Measurement X -  Simple Wound Cleansing - one wound 1 5  - 0 Complex Wound Cleansing - multiple wounds X- 1 5 Wound Imaging (photographs - any number of wounds)  - 0 Wound Tracing (instead of photographs) X- 1 5 Simple Wound Measurement - one wound  - 0 Complex Wound Measurement - multiple wounds INTERVENTIONS - Wound Dressings  - 0 Small Wound Dressing one or multiple wounds  - 0 Medium Wound Dressing one or multiple wounds  - 0 Large Wound Dressing one or multiple wounds  - 0 Application of Medications - topical  - 0 Application of Medications - injection INTERVENTIONS - Miscellaneous  - 0 External ear exam  - 0 Specimen Collection (cultures, biopsies, blood, body fluids, etc.)  - 0 Specimen(s) / Culture(s) sent or taken to Lab for analysis  - 0 Patient Transfer (multiple staff / Nurse, adult / Similar devices)  - 0 Simple Staple / Suture removal (25 or less)  - 0 Complex Staple / Suture removal (26 or more)  - 0 Hypo / Hyperglycemic Management (close monitor of Blood Glucose)  - 0 Ankle / Brachial Index (ABI) - do not check if billed separately X- 1 5 Vital Signs Has the patient been seen at the hospital within the last three years: Yes Total Score: 90 Level Of Care: New/Established - Level 3 Electronic Signature(s) Signed: 06/05/2022 5:03:18 PM By: Shawn Stall RN, BSN [place], [place]a Bradford [name] ([id number]) [id number] Page 3 of 7 Entered By: Shawn Stall on 06/05/2022 15:20:42 -------------------------------------------------------------------------------- Encounter Discharge Information Details Patient Name: Date of Service: [name], [name] 06/05/2022 2:15 PM Medical  Record Number: [id number] Patient Account Number: [id number] Date of Birth/Sex: Treating RN: 1966-02-08 (57 y.o. Arta Silence Primary Care Eric Nees: [name] Other Clinician: Referring Karla Vines: Treating Milta Croson/Extender: Benjamin Stain in Treatment: 3 Encounter Discharge Information Items Discharge Condition: Stable Ambulatory Status: Walker Discharge Destination: Home Transportation: Private Auto Accompanied By: self Schedule Follow-up Appointment: No Clinical Summary of Care: Notes referral sent to [facility]. Electronic Signature(s) Signed: 06/05/2022 5:03:18 PM By: Shawn Stall RN, BSN Entered By: Shawn Stall on 06/05/2022 15:21:18 -------------------------------------------------------------------------------- Lower Extremity Assessment Details Patient Name: Date of Service: [name], [name] 06/05/2022 2:15 PM Medical Record Number: [id number] Patient Account Number: [id number] Date of Birth/Sex: Treating RN: 10-22-1965 (57 y.o. Arta Silence Primary Care Anniah Glick: [name] Other Clinician: Referring Jeilani Grupe: Treating Darya Bigler/Extender: Altamese Dilling Weeks in Treatment: 3 Edema Assessment Assessed: [Left: Yes] [Right: No] Edema: [Left: Ye] [Right: s] Calf Left: Right: Point of Measurement: 35 cm From Medial Instep 61 cm Ankle Left: Right: Point of Measurement: 10 cm From Medial Instep 51 cm Electronic Signature(s) Signed: 06/05/2022 5:03:18 PM By: Shawn Stall RN, BSN Entered By: Shawn Stall on 06/05/2022 14:41:44 -------------------------------------------------------------------------------- Multi Wound Chart Details Patient Name: Date of Service: [name] 06/05/2022 2:15 PM Medical Record Number: [id number] Patient Account Number: [id number] Date of Birth/Sex: Treating RN: 09-Jul-1965 (57 y.o. F) Primary Care Ayse Mccartin: [name] Other Clinician: Referring  Allante Whitmire: Treating Desera Graffeo/Extender: Altamese Dilling Weeks in Treatment: [address] [name], [name] ([id number]) [id number] Page 4 of 7 Height(in): 72 Pulse(bpm): 89 Weight(lbs): 327 Blood Pressure(mmHg): 143/76 Body Mass Index(BMI): 44.3 Temperature(F): 98.5 Respiratory Rate(breaths/min): 20 [4:Photos:] [N/A:N/A] Left, Lateral Lower Leg N/A N/A Wound Location: Gradually Appeared N/A N/A Wounding Event: Lymphedema N/A N/A Primary Etiology: Cataracts, Anemia, Lymphedema, N/A N/A Comorbid History: Asthma, Deep Vein Thrombosis, Hypertension, Osteoarthritis 04/26/2022 N/A N/A Date Acquired: 3 N/A N/A Weeks of Treatment: Healed -  Epithelialized N/A N/A Wound Status: No N/A N/A Wound Recurrence: 0x0x0 N/A N/A Measurements L x W x D (cm) 0 N/A N/A A (cm) : rea 0 N/A N/A Volume (cm) : 100.00% N/A N/A % Reduction in Area: 100.00% N/A N/A % Reduction in Volume: Full Thickness Without Exposed N/A N/A Classification: Support Structures None Present N/A N/A Exudate Amount: Distinct, outline attached N/A N/A Wound Margin: None Present (0%) N/A N/A Granulation Amount: None Present (0%) N/A N/A Necrotic Amount: Fascia: No N/A N/A Exposed Structures: Fat Layer (Subcutaneous Tissue): No Tendon: No Muscle: No Joint: No Bone: No Large (67-100%) N/A N/A Epithelialization: Excoriation: No N/A N/A Periwound Skin Texture: Induration: No Callus: No Crepitus: No Rash: No Scarring: No Maceration: No N/A N/A Periwound Skin Moisture: Dry/Scaly: No Hemosiderin Staining: Yes N/A N/A Periwound Skin Color: Atrophie Blanche: No Cyanosis: No Ecchymosis: No Erythema: No Mottled: No Pallor: No Rubor: No Treatment Notes Electronic Signature(s) Signed: 06/05/2022 4:03:08 PM By: Geralyn CorwinHoffman, Jessica DO Entered By: Geralyn CorwinHoffman, Jessica on 06/05/2022  14:59:37 -------------------------------------------------------------------------------- Multi-Disciplinary Care Plan Details Patient Name: Date of Service: [name], [name]. 06/05/2022 2:15 PM Medical Record Number: [id number] Patient Account Number: [id number] Date of Birth/Sex: Treating RN: 1966-02-10 (57 y.o. Arta SilenceF) [name], [name] Primary Care Blessen Kimbrough: Bertram DenverFleming, [name] Other Clinician: Referring Dustina Scoggin: Treating Errin Whitelaw/Extender: Benjamin StainHoffman, Jessica Fleming, [name] Weeks in Treatment: 3 [name], Destinie [name] ([id number]) [id number] Page 5 of 7 Active Inactive Electronic Signature(s) Signed: 06/05/2022 5:03:18 PM By: Shawn Stalleaton, Bobbi RN, BSN Entered By: Shawn Stalleaton, [name] on 06/05/2022 15:27:15 -------------------------------------------------------------------------------- Pain Assessment Details Patient Name: Date of Service: [name], [name]. 06/05/2022 2:15 PM Medical Record Number: [id number] Patient Account Number: [id number] Date of Birth/Sex: Treating RN: 1966-02-10 (57 y.o. Arta SilenceF) [name], [name] Primary Care Faustina Gebert: Bertram DenverFleming, [name] Other Clinician: Referring Crystalina Stodghill: Treating Cuba Natarajan/Extender: Altamese DillingHoffman, Jessica Fleming, [name] Weeks in Treatment: 3 Active Problems Location of Pain Severity and Description of Pain Patient Has Paino No Site Locations Pain Management and Medication Current Pain Management: Electronic Signature(s) Signed: 06/05/2022 5:03:18 PM By: Shawn Stalleaton, Bobbi RN, BSN Entered By: Shawn Stalleaton, [name] on 06/05/2022 14:41:35 -------------------------------------------------------------------------------- Patient/Caregiver Education Details Patient Name: Date of Service: [name], Rashanna [name]. 4/8/2024andnbsp2:15 PM Medical Record Number: [id number] Patient Account Number: [id number] Date of Birth/Gender: Treating RN: 1966-02-10 (57 y.o. Arta SilenceF) [name], [name] Primary Care Physician: Bertram DenverFleming, [name] Other Clinician: Referring Physician: Treating Physician/Extender:  Benjamin StainHoffman, Jessica Fleming, [name] Weeks in Treatment: 3 Education Assessment Education Provided To: Patient Education Topics Provided Wound/Skin Impairment: Handouts: Caring for Your Ulcer [name], Laquanna [name] ([id number]) [id number] Page 6 of 7 Methods: Explain/Verbal Responses: Reinforcements needed Electronic Signature(s) Signed: 06/05/2022 5:03:18 PM By: Shawn Stalleaton, Bobbi RN, BSN Entered By: Shawn Stalleaton, [name] on 06/05/2022 14:47:31 -------------------------------------------------------------------------------- Wound Assessment Details Patient Name: Date of Service: [name], Rey [name]. 06/05/2022 2:15 PM Medical Record Number: [id number] Patient Account Number: [id number] Date of Birth/Sex: Treating RN: 1966-02-10 (57 y.o. Arta SilenceF) [name], [name] Primary Care Nyja Westbrook: Bertram DenverFleming, [name] Other Clinician: Referring Nashton Belson: Treating Major Santerre/Extender: Altamese DillingHoffman, Jessica Fleming, [name] Weeks in Treatment: 3 Wound Status Wound Number: 4 Primary Lymphedema Etiology: Wound Location: Left, Lateral Lower Leg Wound Healed - Epithelialized Wounding Event: Gradually Appeared Status: Date Acquired: 04/26/2022 Comorbid Cataracts, Anemia, Lymphedema, Asthma, Deep Vein Weeks Of Treatment: 3 History: Thrombosis, Hypertension, Osteoarthritis Clustered Wound: No Photos Wound Measurements Length: (cm) Width: (cm) Depth: (cm) Area: (cm) Volume: (cm) 0 % Reduction in Area: 100% 0 % Reduction in Volume: 100% 0 Epithelialization: Large (67-100%) 0 Tunneling: No 0 Undermining: No Wound Description Classification: Full Thickness Without Exposed Support Wound Margin: Distinct, outline attached Exudate  Amount: None Present Structures Foul Odor After Cleansing: No Slough/Fibrino No Wound Bed Granulation Amount: None Present (0%) Exposed Structure Necrotic Amount: None Present (0%) Fascia Exposed: No Fat Layer (Subcutaneous Tissue) Exposed: No Tendon Exposed: No Muscle Exposed:  No Joint Exposed: No Bone Exposed: No Periwound Skin Texture Texture Color No Abnormalities Noted: No No Abnormalities Noted: No Callus: No Atrophie Blanche: No Crepitus: No Cyanosis: No Excoriation: No Ecchymosis: No Induration: No Erythema: No Rash: No Hemosiderin Staining: Yes Scarring: No Mottled: No Pallor: No [address] TABBETHA, DELERME ([id number]) [id number] Page 7 of 7 Rubor: No No Abnormalities Noted: No Dry / Scaly: No Maceration: No Electronic Signature(s) Signed: 06/05/2022 5:03:18 PM By: Shawn Stall RN, BSN Entered By: Shawn Stall on 06/05/2022 14:57:57 -------------------------------------------------------------------------------- Vitals Details Patient Name: Date of Service: [name] 06/05/2022 2:15 PM Medical Record Number: [id number] Patient Account Number: [id number] Date of Birth/Sex: Treating RN: June 17, 1965 (57 y.o. [name], Yvonne Kendall Primary Care Majesta Leichter: [name] Other Clinician: Referring Dylanie Quesenberry: Treating Sihaam Chrobak/Extender: Altamese Dilling Weeks in Treatment: 3 Vital Signs Time Taken: 14:46 Temperature (F): 98.5 Height (in): 72 Pulse (bpm): 89 Weight (lbs): 327 Respiratory Rate (breaths/min): 20 Body Mass Index (BMI): 44.3 Blood Pressure (mmHg): 143/76 Reference Range: 80 - 120 mg / dl Electronic Signature(s) Signed: 06/05/2022 5:03:18 PM By: Shawn Stall RN, BSN Entered By: Shawn Stall on 06/05/2022 14:46:34

## 2022-06-06 ENCOUNTER — Encounter: Payer: Medicare HMO | Admitting: Occupational Therapy

## 2022-06-08 ENCOUNTER — Encounter: Payer: Medicare HMO | Admitting: Occupational Therapy

## 2022-06-13 ENCOUNTER — Encounter: Payer: Medicare HMO | Admitting: Occupational Therapy

## 2022-06-15 ENCOUNTER — Encounter: Payer: Medicare HMO | Admitting: Occupational Therapy

## 2022-06-15 ENCOUNTER — Encounter: Payer: Self-pay | Admitting: Occupational Therapy

## 2022-06-15 ENCOUNTER — Ambulatory Visit: Payer: Medicare HMO | Attending: Internal Medicine | Admitting: Occupational Therapy

## 2022-06-15 DIAGNOSIS — I890 Lymphedema, not elsewhere classified: Secondary | ICD-10-CM | POA: Diagnosis present

## 2022-06-15 NOTE — Therapy (Signed)
OUTPATIENT OCCUPATIONAL THERAPY TREATMENT NOTE & PROGRESS REPORT  BILATERAL  LOWER EXTREMITY LYMPHEDEMA  Patient Name: [name] MRN: [id number] DOB:[birth year], 57 y.o., female Today's Date: 06/15/2022   END OF SESSION:  OT End of Session - 06/15/22 1314     Visit Number 11    Number of Visits 36    Date for OT Re-Evaluation 09/13/22    OT Start Time 0105    OT Stop Time 0215    OT Time Calculation (min) 70 min    Activity Tolerance Patient tolerated treatment well;No increased pain    Behavior During Therapy WFL for tasks assessed/performed              Past Medical History:  Diagnosis Date   Anemia    Anxiety    Arthritis    Asthma    hx as child - no prob as adult - no inhaler   Blood transfusion 01/22/11   transfusion 2 units at [facility]   Depression 08/2010   psych assessment   Dyspnea    Fibroid    Headache(784.0)    rx for imitrex - last one jan   Keloid    Lymphedema    Pulmonary embolism    Past Surgical History:  Procedure Laterality Date   ABDOMINAL HYSTERECTOMY     COLONOSCOPY N/A 11/08/2017   Procedure: COLONOSCOPY;  Surgeon: West Bali, MD;  Location: [facility];  Service: Endoscopy;  Laterality: N/A;  2:00pm   FLEXIBLE SIGMOIDOSCOPY N/A 09/17/2017   Procedure: FLEXIBLE SIGMOIDOSCOPY;  Surgeon: West Bali, MD;  Location: [facility];  Service: Endoscopy;  Laterality: N/A;   HERNIA REPAIR  2009   umbilical   POLYPECTOMY  11/08/2017   Procedure: POLYPECTOMY;  Surgeon: West Bali, MD;  Location: [facility];  Service: Endoscopy;;  ascending colon (CSx1), transverse colon (CS x1), splenic flexure (HSx1)   SVD     Spontaneous vaginal delivery; x 1   Patient Active Problem List   Diagnosis Date Noted   Rheumatoid arthritis 03/06/2022   Lymphedema 03/06/2022   Morbid obesity 09/14/2021   Chest pain 04/07/2021   Screening for malignant neoplasm of colon 03/09/2021   Constipation 03/09/2021   Iron deficiency anemia  03/09/2021   Body mass index (BMI) 45.0-49.9, adult 08/20/2020   Chronic fatigue syndrome 08/20/2020   Polyarthralgia 08/20/2020   Vitamin D deficiency 08/20/2020   Cellulitis 12/31/2018   Elephantiasis 12/31/2018   Ischemic chest pain 12/31/2018   History of pulmonary embolus (PE) 12/31/2018   Physical deconditioning 08/27/2018   Adenopathy 09/15/2017   Family history of colon cancer 08/01/2017   Taking multiple medications for chronic disease 08/01/2017   Axillary lymphadenopathy 01/26/2017   Urinary incontinence 01/25/2017   Hypokalemia 01/25/2017   Lobar pneumonia 01/25/2017   Pressure ulcer of sacral region, stage 2 01/25/2017   Abnormal ECG 01/29/2013   Dyspnea    Menorrhagia with regular cycle 02/22/2011   Anemia 02/22/2011    PCP: Loreen Freud, NP  REFERRING PROVIDER: Geralynn Rile, DPM  REFERRING DIAG: I89.0  THERAPY DIAG:  Lymphedema, not elsewhere classified  ONSET DATE: 2018  SUBJECTIVE:  SUBJECTIVE STATEMENT: Pt last seen on 05/09/22 when lymphedema treatment was interrupted by development of an open wound on the back of the L leg. Pt denies LE related pain in her legs today but reports 8/10 RUE arthritis pain at wrist elbow and shoulder. Pt states she hopes she can , "get my legs small enough to be able to walk without a walker." Pt states she believes she is able get transportation to resume OT for LE care 2 x weekly. Pt and OT discussed benefit of getting Physical Therapy for strengthening, building endurance, and increasing safety with balance training once she completes lymphedema course to realize her goal of walking device free.  PERTINENT HISTORY:  Severe, stage IV, BLE Lymphedema (Elephantiasis [place]). ENV is a complication of sever, chronic, progressive  Lymphedema.  HTN, blood clot disorder with PE x 2 in 2018, RA, Hx recurrent cellulitis, severe morbid obesity (body mass index > 40.4 kg/m2).RA, deconditioning, hx pressure ulcer sacrum, hx non-pressure leg wound, hx depression hx chronic fatigue, hx keloids PAIN:  Are you having pain? : yes- R shoulder elbow and wrist; denies lymphedema related leg pain NPRS scale:8/10 Pain location: RUE Pain description:OA pain Aggravating: weight bearing, functional upper extremity use against gravity Relieving factors: rest   PRECAUTIONS: Fall, recurrent infections, hx non-pressure leg wound;    IMPAIRMENTS AND FUNCTIONAL LIMITATIONS: difficulty walking, chronic, progressive limb swelling with associated pain and disfigurement, impaired balance, generalized debility, decreased BLE AROM, decreased strength, impaired endurance, thickened skin limits flexibility  impaired basic and instrumental ADLs ( bathing, dressing, grooming (skin inspection, skin and nail care), impaired functional ambulation, transfers and mobility, impaired home management, shopping, unable to cook and prep food, unable to drive, dependent for wbc propulsion, unable to work, impaired social participation, impaired body image  PRIOR LEVEL OF FUNCTION: Requires assistive devices and varying levels of assistance with basic and instrumental ADLs, Needs assistance with functional ambulation, all transfers and functional mobility. Unable to work. Housebound due to impaired mobility and relies on others for transportation  PATIENT GOALS: Initial evaluation goal: keep swelling from getting worse so I don't lose my leg. Re-eval gal: 06/14/22: get my legs small enough to be able to walk without a walker.   OBJECTIVE:   OBSERVATIONS /ASSESSMENTS:     FOTO (functional outcomes measure): Intake score 03/01/22 44/100%  Lymphedema Life Impact Scale (LLIS) 61.76% (How much lymphedema related problems interfered with your life last week)  BLE  COMPARATIVE LIMB VOLUMETRICS:   LANDMARK LEFT  03/14/22 INITIAL   R LEG (A-D) 14499.2 ml  R THIGH (E-G) 17195.4 ml  R FULL LIMB (A-G) 31694.6 ml  Limb Volume differential (LVD)  %  Volume change since initial %  Volume change overall %  (Blank rows = not tested)   LANDMARK LEFT  05/09/22 10 Rx Visit  L LEG (A-D) 15607.0 ml  L THIGH (E-G)  21709.3 ml  L FULL LIMB (A-G) 37316.4 ml  Limb Volume differential (LVD)  %  Volume change since initial R Leg (A-D) Increased in volume by 7.6% L thigh (E-G) INC by 26.25% L Full limb (E-G) is INC by 17.7%.   Volume change overall %        TODAY'S TREATMENT:  LLE/LLQ MLD utilizing short neck sequence, deep abdominal lymphatics and functional inguinal LN. Skin care with low ph castor oil simultaneously during MLD to improve skin excursion and limit infection risk Multilayer knee length compression wraps to L Leg as established. PATIENT EDUCATION:  Continued Pt/ CG edu for lymphedema self care home program throughout session. Topics include outcome of comparative limb volumetrics- starting limb volume differentials (LVDs), technology and gradient techniques used for short stretch, multilayer compression wrapping, simple self-MLD, therapeutic lymphatic pumping exercises, skin/nail care, LE precautions,. compression garment recommendations and specifications, wear and care schedule and compression garment donning / doffing w assistive devices. Discussed progress towards all OT goals since commencing CDT. All questions answered to the Pt's satisfaction.  Person educated: Patient and family member Education method: Explanation, Demonstration, Verbal and Tactile cues, and Handouts Education comprehension: verbalized understanding and returned demonstration  HOME EXERCISE PROGRAM: Daily , multilayer compression bandages  to one limb at a time only during Intensive Phase CDT. Daily day and HOS  compression garments during Self-management Phase. Daily skin care w low ph lotion Daily lymphatic pumping there ex, 1 set of 10 each element, in order, bilaterally Daily simple self-MLD  Lymphedema Self- Care Instructions   1. EXERCISE: Perform lymphatic pumping there ex at least 2 x a day while wearing your compression wraps or garments. Perform 10 reps of each exercise bilaterally and be sure to perform them in order. Don't skip around!  OMIT PARTIAL SIT UPs.  2. MLD: Perform simple self-manual lymphatic drainage (MLD) at least once a day as directed. Take your time! Breathe! ;-)  3. If you have a Flexitouch advanced "pump" use it 1 time each day on a single limb only. The Flexitouch moves lymphatic fluid out of your affected body part and back to your heart, so DO NOT use the Flexi on 2 legs at a time, and DO NOT cues it on 2 legs on the same day. If you experience any atypical shortness of breath, sudden onset of pain, or feelings of heart arhythmia, or racing, discontinue use of the Flexitouch and report these symptoms to your doctor right away. Also, discontinue Flexi if you have an infection or a fever. It's OK to resume using the device 72 hours AFTER your first dose of oral antibiotic.   4. 4. WRAPS: Compression wraps are to be worn 23 hrs/ 7 days/wk during Intensive Phase of Complete Decongestive Therapy (CDT).Building tolerance may take time and practice, so don't get discouraged. If bandages begin to feel tight during periods of inactivity and/or during the night, try performing your exercises to loosen them. Do not leave short stretch wraps in place for > 23 hours. It is very important that you remove all wraps daily to inspect skin, bathe and perform skin care before reapplying your wraps.  5. Daytime GARMENTS/ HOS DEVICES: During the Self-Management Phase CDT your compression garments are to be worn during  waking hours when active. Do NOT sleep in your garments!!  Don daytime garments first thing in the morning. Do not wear your HOS devices all day instead of garments. These will not contain your swelling.  6. PUT YOUR FEET UP! Elevate your feet and legs and feet to the level of your heart whenever you are sitting down.   7. SKIN: Carefully monitor skin condition and perform impeccable hygiene daily. Bathe skin with mild soap and water and apply low pH lotion (aka Eucerin ) to improve hydration and limit infection risk.   ASSESSMENT: CLINICAL  IMPRESSION: Pt returns to OT for Complete Decongestive Therapy for lymphedema care after Intensive Phase treatment course interrupted after 10 th visit by need for wound care to posterior L leg. Wound is resolved and strong odor is absent. Upon assessment clinical gains in skin excursion and reduction in size of lymphatic cysts on L leg are not retained. Foot swelling is slightly decreased, but not as small as when last seen on 3/12 and Pt was getting wrapped between visits consistently. Repeat limb volumetrics were not completed today due to time constraints, but by visual assessment skin condition and limb volume is equivalent  with condition at initial eval. On 03/01/22.   Commenced LLE/LLQ MLD with simultaneous skin care with Eucerin cream to increase skin excursion and hydration. Applied multilayer compression wraps over Rosidal foam from base of toes to popliteal fossa. Added Artiflex at ankle crease. Reviewed critical need for daily compression wrap changes, skin care and inspection for optimal outcome.Cont as per POC   OBJECTIVE IMPAIRMENTS: Abnormal gait, decreased activity tolerance, decreased balance, decreased endurance, decreased knowledge of condition, decreased knowledge of use of DME, decreased mobility, difficulty walking, decreased ROM, decreased strength, dizziness, hypomobility, increased edema, increased fascial restrictions, impaired  flexibility, impaired sensation, postural dysfunction, obesity, pain, and chronic, progressive swelling .   ACTIVITY LIMITATIONS: carrying, lifting, bending, standing, squatting, stairs, transfers, bed mobility, continence, bathing, toileting, dressing, hygiene/grooming, and caring for others  PARTICIPATION LIMITATIONS: meal prep, cleaning, laundry, driving, shopping, community activity, occupation, yard work, school, church, and caring for others  PERSONAL FACTORS: 3+ contributing Co morbidities, limited transportation, consistent, ongoing availability of caregivers, financial limitations are also affecting patient's functional outcome.   REHAB POTENTIAL: Lymphedema management has a high burden of care, especially for Patients who have difficulty, or who are unable to reach their distal legs and feet to apply bandages, compression garments, to bathe, inspect skin and groom nails. In this case because Pt is unable to perform these activities, daily caregiver assistance with all lower extremity lymphedema self-care home program components is essential for achieving clinical success and ongoing self-management. Without consistent, ongoing, daily caregiver assistance for compression bandaging and garments, skin care, and MLD Pt's prognosis is poor. With consistent caregiver assistance with lymphedema home program Pt's prognosis for improved lymphedema control and reducing infection risk is guarded, at best, due to the advanced stage, multiple contributing co-morbidities, and sedentary lifestyle.  EVALUATION COMPLEXITY: High   GOALS: Goals reviewed with patient? YES  SHORT TERM GOALS: Target date: 6th OT visit   Pt will demonstrate understanding of lymphedema precautions and prevention strategies with modified independence using a printed reference to identify at least 5 precautions and discussing how s/he may implement them into daily life to reduce risk of progression with modified  assistance. Baseline: Max A Goal status:05/08/22 ONGOING  2.   With Max caregiver assistance Pt will be able to apply multilayer, knee length, compression wraps using gradient techniques to decrease limb volume, to limit infection risk, and to limit lymphedema progression.  Baseline: Dependent Goal status: 05/08/22 GOAL MET  LONG TERM GOALS: Target date: 05/30/22  Given this patient's Intake score of 44/100% on the functional outcomes FOTO tool, patient will experience an increase in function of 3  points to improve basic and instrumental ADLs performance, including lymphedema self-care. Baseline: 44/100 Goal status: 05/08/22 ONGOING  2.  Given this patient's Intake score of TBA % on the Lymphedema Life Impact Scale (LLIS), patient will experience a reduction of at least 5% in her perceived  level of functional impairment resulting from lymphedema to improve functional performance and quality of life (QOL). Baseline: TBA Goal status: 05/08/22 ONGOING  3.  With max caregiver assistance Pt will be able to don and doff appropriate compression garments and/or devices to control BLE lymphedema and to limit progression.  Baseline: Dependent Goal status: 05/08/22 ONGOING. Limb volume not reduced enough to date to consider garment measurement and fitting.  4.  With tolerance building strategies over time  Pt will be able to tolerate compression bandages and garments for at least 24 hours  to control BLE lymphedema and to limit progression.  Baseline: Max A Goal status: 05/08/22 GOAL MET for Bandages. ONGOING for garments.  5.  With Max caregiver assistance Pt will achieve and sustain no less than 85% compliance with all LE self-care home program components throughout CDT, including modified simple self-MLD, daily skin care and inspection, lymphatic pumping the ex and appropriate compression to limit lymphedema progression and to limit further functional decline. Baseline: Dependent Goal status: 05/08/22  ONGOING  6.  Pt will diligently perform skin care regime prescribed by her doctor to eliminate fungal infection and prevent reoccurrence for 90 days Baseline: Dependent Goal status: 05/08/22 ONGOING   OT PLAN OF CARE:  PT FREQUENCY: 2x/week  PT DURATION: other: 12 weeks and PRN  . 05/08/22 Due to the severity of this case and double limb involvement it is expected that OT for CDT will quite a bit longer THAT INITIAL 12 WEEK POC  PLANNED INTERVENTIONS: Therapeutic exercises, Therapeutic activity, Patient/Family education, Self Care, DME instructions, Manual lymph drainage, Compression bandaging, Vasopneumatic device, Manual therapy, and skin care  PLAN FOR NEXT SESSION:  Skin care MLD Multilayer wraps Pt/ family edu  Loel Dubonnet, MS, OTR/L, CLT-LANA 06/15/22 2:36 PM

## 2022-06-20 ENCOUNTER — Ambulatory Visit: Payer: Medicare HMO | Admitting: Occupational Therapy

## 2022-06-20 ENCOUNTER — Encounter: Payer: Self-pay | Admitting: Occupational Therapy

## 2022-06-20 ENCOUNTER — Encounter: Payer: Medicare HMO | Admitting: Occupational Therapy

## 2022-06-20 DIAGNOSIS — I890 Lymphedema, not elsewhere classified: Secondary | ICD-10-CM

## 2022-06-20 NOTE — Therapy (Signed)
OUTPATIENT OCCUPATIONAL THERAPY TREATMENT NOTE  BILATERAL  LOWER EXTREMITY LYMPHEDEMA  Patient Name: [name] MRN: [id number] DOB:1965/12/15, 57 y.o., female Today's Date: 06/21/2022   END OF SESSION:  OT End of Session - 06/20/22 1258     Visit Number 12    Number of Visits 36    Date for OT Re-Evaluation 09/13/22    OT Start Time 0100    OT Stop Time 0205    OT Time Calculation (min) 65 min    Activity Tolerance Patient tolerated treatment well;No increased pain    Behavior During Therapy WFL for tasks assessed/performed              Past Medical History:  Diagnosis Date   Anemia    Anxiety    Arthritis    Asthma    hx as child - no prob as adult - no inhaler   Blood transfusion 01/22/11   transfusion 2 units at [facility]   Depression 08/2010   psych assessment   Dyspnea    Fibroid    Headache(784.0)    rx for imitrex - last one jan   Keloid    Lymphedema    Pulmonary embolism    Past Surgical History:  Procedure Laterality Date   ABDOMINAL HYSTERECTOMY     COLONOSCOPY N/A 11/08/2017   Procedure: COLONOSCOPY;  Surgeon: West Bali, MD;  Location: [facility];  Service: Endoscopy;  Laterality: N/A;  2:00pm   FLEXIBLE SIGMOIDOSCOPY N/A 09/17/2017   Procedure: FLEXIBLE SIGMOIDOSCOPY;  Surgeon: West Bali, MD;  Location: [facility];  Service: Endoscopy;  Laterality: N/A;   HERNIA REPAIR  2009   umbilical   POLYPECTOMY  11/08/2017   Procedure: POLYPECTOMY;  Surgeon: West Bali, MD;  Location: [facility];  Service: Endoscopy;;  ascending colon (CSx1), transverse colon (CS x1), splenic flexure (HSx1)   SVD     Spontaneous vaginal delivery; x 1   Patient Active Problem List   Diagnosis Date Noted   Rheumatoid arthritis 03/06/2022   Lymphedema 03/06/2022   Morbid obesity 09/14/2021   Chest pain 04/07/2021   Screening for malignant neoplasm of colon 03/09/2021   Constipation 03/09/2021   Iron deficiency anemia 03/09/2021   Body mass  index (BMI) 45.0-49.9, adult 08/20/2020   Chronic fatigue syndrome 08/20/2020   Polyarthralgia 08/20/2020   Vitamin D deficiency 08/20/2020   Cellulitis 12/31/2018   Elephantiasis 12/31/2018   Ischemic chest pain 12/31/2018   History of pulmonary embolus (PE) 12/31/2018   Physical deconditioning 08/27/2018   Adenopathy 09/15/2017   Family history of colon cancer 08/01/2017   Taking multiple medications for chronic disease 08/01/2017   Axillary lymphadenopathy 01/26/2017   Urinary incontinence 01/25/2017   Hypokalemia 01/25/2017   Lobar pneumonia 01/25/2017   Pressure ulcer of sacral region, stage 2 01/25/2017   Abnormal ECG 01/29/2013   Dyspnea    Menorrhagia with regular cycle 02/22/2011   Anemia 02/22/2011    PCP: Loreen Freud, NP  REFERRING PROVIDER: Geralynn Rile, DPM  REFERRING DIAG: I89.0  THERAPY DIAG:  Lymphedema, not elsewhere classified  ONSET DATE: 2018  SUBJECTIVE:  SUBJECTIVE STATEMENT:  [name] presents to Occupational Therapy for lymphedema care to BLE , stage 3, elephantiasis. Pt is currently undergoing Phase CDT to LLE. She does not rate LE-related pain. She has no new complaints or concerns.   PERTINENT HISTORY:  Severe, stage IV, BLE Lymphedema (Elephantiasis [place]). ENV is a complication of sever, chronic, progressive Lymphedema.  HTN, blood clot disorder with PE x 2 in 2018, RA, Hx recurrent cellulitis, severe morbid obesity (body mass index > 40.4 kg/m2).RA, deconditioning, hx pressure ulcer sacrum, hx non-pressure leg wound, hx depression hx chronic fatigue, hx keloids PAIN:  Are you having pain? : yes- R shoulder elbow and wrist; lymphedema related leg pain not rated NPRS scale:/10 Pain location: RUE Pain description:OA pain Aggravating:  weight bearing, functional upper extremity use against gravity Relieving factors: rest   PRECAUTIONS: Fall, recurrent infections, hx non-pressure leg wound;    IMPAIRMENTS AND FUNCTIONAL LIMITATIONS: difficulty walking, chronic, progressive limb swelling with associated pain and disfigurement, impaired balance, generalized debility, decreased BLE AROM, decreased strength, impaired endurance, thickened skin limits flexibility  impaired basic and instrumental ADLs ( bathing, dressing, grooming (skin inspection, skin and nail care), impaired functional ambulation, transfers and mobility, impaired home management, shopping, unable to cook and prep food, unable to drive, dependent for wbc propulsion, unable to work, impaired social participation, impaired body image  PRIOR LEVEL OF FUNCTION: Requires assistive devices and varying levels of assistance with basic and instrumental ADLs, Needs assistance with functional ambulation, all transfers and functional mobility. Unable to work. Housebound due to impaired mobility and relies on others for transportation  PATIENT GOALS: Initial evaluation goal: keep swelling from getting worse so I don't lose my leg. Re-eval gal: 06/14/22: get my legs small enough to be able to walk without a walker.   OBJECTIVE:   OBSERVATIONS /ASSESSMENTS:     FOTO (functional outcomes measure): Intake score 03/01/22 44/100%  Lymphedema Life Impact Scale (LLIS) 61.76% (How much lymphedema related problems interfered with your life last week)  BLE COMPARATIVE LIMB VOLUMETRICS:   LANDMARK LEFT  03/14/22 INITIAL   R LEG (A-D) 14499.2 ml  R THIGH (E-G) 17195.4 ml  R FULL LIMB (A-G) 31694.6 ml  Limb Volume differential (LVD)  %  Volume change since initial %  Volume change overall %  (Blank rows = not tested)   LANDMARK LEFT  05/09/22 10 Rx Visit  L LEG (A-D) 15607.0 ml  L THIGH (E-G)  21709.3 ml  L FULL LIMB (A-G) 37316.4 ml  Limb Volume differential (LVD)  %  Volume  change since initial R Leg (A-D) Increased in volume by 7.6% L thigh (E-G) INC by 26.25% L Full limb (E-G) is INC by 17.7%.   Volume change overall %        TODAY'S TREATMENT:  LLE/LLQ MLD utilizing short neck sequence, deep abdominal lymphatics and functional inguinal LN. Skin care with low ph castor oil simultaneously during MLD to improve skin excursion and limit infection risk Multilayer knee length compression wraps to L Leg as established. PATIENT EDUCATION:  Continued Pt/ CG edu for lymphedema self care home program throughout session. Topics include outcome of comparative limb volumetrics- starting limb volume differentials (LVDs), technology and gradient techniques used for short stretch, multilayer compression wrapping, simple self-MLD, therapeutic lymphatic pumping exercises, skin/nail care, LE precautions,. compression garment recommendations and specifications, wear and care schedule and compression garment donning / doffing w assistive devices. Discussed progress towards all OT goals since commencing CDT. All questions answered to the Pt's satisfaction.  Person educated: Patient and family member Education method: Explanation, Demonstration, Verbal and Tactile cues, and Handouts Education comprehension: verbalized understanding and returned demonstration  HOME EXERCISE PROGRAM: Daily , multilayer compression bandages to one limb at a time only during Intensive Phase CDT. Daily day and HOS  compression garments during Self-management Phase. Daily skin care w low ph lotion Daily lymphatic pumping there ex, 1 set of 10 each element, in order, bilaterally Daily simple self-MLD  Lymphedema Self- Care Instructions   1. EXERCISE: Perform lymphatic pumping there ex at least 2 x a day while wearing your compression wraps or garments. Perform 10 reps  of each exercise bilaterally and be sure to perform them in order. Don't skip around!  OMIT PARTIAL SIT UPs.  2. MLD: Perform simple self-manual lymphatic drainage (MLD) at least once a day as directed. Take your time! Breathe! ;-)  3. If you have a Flexitouch advanced "pump" use it 1 time each day on a single limb only. The Flexitouch moves lymphatic fluid out of your affected body part and back to your heart, so DO NOT use the Flexi on 2 legs at a time, and DO NOT cues it on 2 legs on the same day. If you experience any atypical shortness of breath, sudden onset of pain, or feelings of heart arhythmia, or racing, discontinue use of the Flexitouch and report these symptoms to your doctor right away. Also, discontinue Flexi if you have an infection or a fever. It's OK to resume using the device 72 hours AFTER your first dose of oral antibiotic.   4. 4. WRAPS: Compression wraps are to be worn 23 hrs/ 7 days/wk during Intensive Phase of Complete Decongestive Therapy (CDT).Building tolerance may take time and practice, so don't get discouraged. If bandages begin to feel tight during periods of inactivity and/or during the night, try performing your exercises to loosen them. Do not leave short stretch wraps in place for > 23 hours. It is very important that you remove all wraps daily to inspect skin, bathe and perform skin care before reapplying your wraps.  5. Daytime GARMENTS/ HOS DEVICES: During the Self-Management Phase CDT your compression garments are to be worn during waking hours when active. Do NOT sleep in your garments!!  Don daytime garments first thing in the morning. Do not wear your HOS devices all day instead of garments. These will not contain your swelling.  6. PUT YOUR FEET UP! Elevate your feet and legs and feet to the level of your heart whenever you are sitting down.   7. SKIN: Carefully monitor skin condition and perform impeccable hygiene daily. Bathe skin with mild soap and water and  apply low pH lotion (aka Eucerin ) to improve hydration and limit infection risk.   ASSESSMENT: CLINICAL  IMPRESSION: Pt presents with compression wraps applied last OT session in place, despite directions to remove  wraps, inspect and clean skin daily, and reapply clean wraps. At present Pt's daughter, who was assisting her initially w wraps and transportation. is no l;longer assisting her. Pt verbalizes understanding of the critical importance of skin care for limiting infection risk. L leg and foot volume are obviously decreased today. Lymphatic cysts are less pronounced. Slight drainage on the chucks pad under the leg is observed after manual therapy and before applying compression wraps. No odor detected. Pt tolerated MLD with simultaneous LLE skin care without increased pain. We reapplied compression wraps as established. Cont as per POC. Next visit Repeat limb volumetrics . Cont as per POC.   OBJECTIVE IMPAIRMENTS: Abnormal gait, decreased activity tolerance, decreased balance, decreased endurance, decreased knowledge of condition, decreased knowledge of use of DME, decreased mobility, difficulty walking, decreased ROM, decreased strength, dizziness, hypomobility, increased edema, increased fascial restrictions, impaired flexibility, impaired sensation, postural dysfunction, obesity, pain, and chronic, progressive swelling .   ACTIVITY LIMITATIONS: carrying, lifting, bending, standing, squatting, stairs, transfers, bed mobility, continence, bathing, toileting, dressing, hygiene/grooming, and caring for others  PARTICIPATION LIMITATIONS: meal prep, cleaning, laundry, driving, shopping, community activity, occupation, yard work, school, church, and caring for others  PERSONAL FACTORS: 3+ contributing Co morbidities, limited transportation, consistent, ongoing availability of caregivers, financial limitations are also affecting patient's functional outcome.   REHAB POTENTIAL: Lymphedema management  has a high burden of care, especially for Patients who have difficulty, or who are unable to reach their distal legs and feet to apply bandages, compression garments, to bathe, inspect skin and groom nails. In this case because Pt is unable to perform these activities, daily caregiver assistance with all lower extremity lymphedema self-care home program components is essential for achieving clinical success and ongoing self-management. Without consistent, ongoing, daily caregiver assistance for compression bandaging and garments, skin care, and MLD Pt's prognosis is poor. With consistent caregiver assistance with lymphedema home program Pt's prognosis for improved lymphedema control and reducing infection risk is guarded, at best, due to the advanced stage, multiple contributing co-morbidities, and sedentary lifestyle.  EVALUATION COMPLEXITY: High   GOALS: Goals reviewed with patient? YES  SHORT TERM GOALS: Target date: 6th OT visit   Pt will demonstrate understanding of lymphedema precautions and prevention strategies with modified independence using a printed reference to identify at least 5 precautions and discussing how s/he may implement them into daily life to reduce risk of progression with modified assistance. Baseline: Max A Goal status:05/08/22 ONGOING  2.   With Max caregiver assistance Pt will be able to apply multilayer, knee length, compression wraps using gradient techniques to decrease limb volume, to limit infection risk, and to limit lymphedema progression.  Baseline: Dependent Goal status: 05/08/22 GOAL MET  LONG TERM GOALS: Target date: 05/30/22  Given this patient's Intake score of 44/100% on the functional outcomes FOTO tool, patient will experience an increase in function of 3  points to improve basic and instrumental ADLs performance, including lymphedema self-care. Baseline: 44/100 Goal status: 05/08/22 ONGOING  2.  Given this patient's Intake score of TBA % on the  Lymphedema Life Impact Scale (LLIS), patient will experience a reduction of at least 5% in her perceived level of functional impairment resulting from lymphedema to improve functional performance and quality of life (QOL). Baseline: TBA Goal status: 05/08/22 ONGOING  3.  With max caregiver assistance Pt will be able to don and doff appropriate compression garments and/or  devices to control BLE lymphedema and to limit progression.  Baseline: Dependent Goal status: 05/08/22 ONGOING. Limb volume not reduced enough to date to consider garment measurement and fitting.  4.  With tolerance building strategies over time  Pt will be able to tolerate compression bandages and garments for at least 24 hours  to control BLE lymphedema and to limit progression.  Baseline: Max A Goal status: 05/08/22 GOAL MET for Bandages. ONGOING for garments.  5.  With Max caregiver assistance Pt will achieve and sustain no less than 85% compliance with all LE self-care home program components throughout CDT, including modified simple self-MLD, daily skin care and inspection, lymphatic pumping the ex and appropriate compression to limit lymphedema progression and to limit further functional decline. Baseline: Dependent Goal status: 05/08/22 ONGOING  6.  Pt will diligently perform skin care regime prescribed by her doctor to eliminate fungal infection and prevent reoccurrence for 90 days Baseline: Dependent Goal status: 05/08/22 ONGOING   OT PLAN OF CARE:  PT FREQUENCY: 2x/week  PT DURATION: other: 12 weeks and PRN  . 05/08/22 Due to the severity of this case and double limb involvement it is expected that OT for CDT will quite a bit longer THAT INITIAL 12 WEEK POC  PLANNED INTERVENTIONS: Therapeutic exercises, Therapeutic activity, Patient/Family education, Self Care, DME instructions, Manual lymph drainage, Compression bandaging, Vasopneumatic device, Manual therapy, and skin care  PLAN FOR NEXT SESSION:  Skin  care MLD Multilayer wraps Pt/ family edu  Loel Dubonnet, MS, OTR/L, CLT-LANA 06/21/22 9:10 AM

## 2022-06-22 ENCOUNTER — Ambulatory Visit: Payer: Medicare HMO | Admitting: Occupational Therapy

## 2022-06-22 ENCOUNTER — Encounter: Payer: Medicare HMO | Admitting: Occupational Therapy

## 2022-06-23 ENCOUNTER — Ambulatory Visit: Payer: Medicare HMO | Admitting: Occupational Therapy

## 2022-06-27 ENCOUNTER — Encounter: Payer: Self-pay | Admitting: Occupational Therapy

## 2022-06-27 ENCOUNTER — Ambulatory Visit: Payer: Medicare HMO | Admitting: Occupational Therapy

## 2022-06-27 ENCOUNTER — Encounter: Payer: Medicare HMO | Admitting: Occupational Therapy

## 2022-06-27 DIAGNOSIS — I890 Lymphedema, not elsewhere classified: Secondary | ICD-10-CM | POA: Diagnosis not present

## 2022-06-27 NOTE — Therapy (Incomplete)
OUTPATIENT OCCUPATIONAL THERAPY TREATMENT NOTE  BILATERAL  LOWER EXTREMITY LYMPHEDEMA  Patient Name: [name] MRN: [id number] DOB:18-Mar-1965, 57 y.o., female Today's Date: 06/28/2022   END OF SESSION:  OT End of Session - 06/27/22 1303     Visit Number 13    Number of Visits 36    Date for OT Re-Evaluation 09/13/22    OT Start Time 0105    OT Stop Time 0210    OT Time Calculation (min) 65 min    Activity Tolerance Patient tolerated treatment well;No increased pain    Behavior During Therapy WFL for tasks assessed/performed              Past Medical History:  Diagnosis Date   Anemia    Anxiety    Arthritis    Asthma    hx as child - no prob as adult - no inhaler   Blood transfusion 01/22/11   transfusion 2 units at [facility]   Depression 08/2010   psych assessment   Dyspnea    Fibroid    Headache(784.0)    rx for imitrex - last one jan   Keloid    Lymphedema    Pulmonary embolism (HCC)    Past Surgical History:  Procedure Laterality Date   ABDOMINAL HYSTERECTOMY     COLONOSCOPY N/A 11/08/2017   Procedure: COLONOSCOPY;  Surgeon: West Bali, MD;  Location: [facility];  Service: Endoscopy;  Laterality: N/A;  2:00pm   FLEXIBLE SIGMOIDOSCOPY N/A 09/17/2017   Procedure: FLEXIBLE SIGMOIDOSCOPY;  Surgeon: West Bali, MD;  Location: [facility];  Service: Endoscopy;  Laterality: N/A;   HERNIA REPAIR  2009   umbilical   POLYPECTOMY  11/08/2017   Procedure: POLYPECTOMY;  Surgeon: West Bali, MD;  Location: [facility];  Service: Endoscopy;;  ascending colon (CSx1), transverse colon (CS x1), splenic flexure (HSx1)   SVD     Spontaneous vaginal delivery; x 1   Patient Active Problem List   Diagnosis Date Noted   Rheumatoid arthritis (HCC) 03/06/2022   Lymphedema 03/06/2022   Morbid obesity (HCC) 09/14/2021   Chest pain 04/07/2021   Screening for malignant neoplasm of colon 03/09/2021   Constipation 03/09/2021   Iron deficiency anemia  03/09/2021   Body mass index (BMI) 45.0-49.9, adult (HCC) 08/20/2020   Chronic fatigue syndrome 08/20/2020   Polyarthralgia 08/20/2020   Vitamin D deficiency 08/20/2020   Cellulitis 12/31/2018   Elephantiasis 12/31/2018   Ischemic chest pain (HCC) 12/31/2018   History of pulmonary embolus (PE) 12/31/2018   Physical deconditioning 08/27/2018   Adenopathy 09/15/2017   Family history of colon cancer 08/01/2017   Taking multiple medications for chronic disease 08/01/2017   Axillary lymphadenopathy 01/26/2017   Urinary incontinence 01/25/2017   Hypokalemia 01/25/2017   Lobar pneumonia (HCC) 01/25/2017   Pressure ulcer of sacral region, stage 2 (HCC) 01/25/2017   Abnormal ECG 01/29/2013   Dyspnea    Menorrhagia with regular cycle 02/22/2011   Anemia 02/22/2011    PCP: Loreen Freud, NP  REFERRING PROVIDER: Geralynn Rile, DPM  REFERRING DIAG: I89.0  THERAPY DIAG:  Lymphedema, not elsewhere classified  ONSET DATE: 2018  SUBJECTIVE:  SUBJECTIVE STATEMENT:  [name] presents to Occupational Therapy for lymphedema care to BLE , stage 3, elephantiasis. Pt is currently undergoing Phase CDT to LLE. She does not rate LE-related pain. She has no new complaints or concerns.   PERTINENT HISTORY:  Severe, stage IV, BLE Lymphedema (Elephantiasis [place]). ENV is a complication of sever, chronic, progressive Lymphedema.  HTN, blood clot disorder with PE x 2 in 2018, RA, Hx recurrent cellulitis, severe morbid obesity (body mass index > 40.4 kg/m2).RA, deconditioning, hx pressure ulcer sacrum, hx non-pressure leg wound, hx depression hx chronic fatigue, hx keloids PAIN:  Are you having pain? : yes- R shoulder elbow and wrist; lymphedema related leg pain not rated NPRS scale:/10 Pain location:  RUE Pain description:OA pain Aggravating: weight bearing, functional upper extremity use against gravity Relieving factors: rest   PRECAUTIONS: Fall, recurrent infections, hx recurrent non-pressure leg wound;    IMPAIRMENTS AND FUNCTIONAL LIMITATIONS: difficulty walking, chronic, progressive limb swelling with associated pain and disfigurement, impaired balance, generalized debility, decreased BLE AROM, decreased strength, impaired endurance, thickened skin limits flexibility  impaired basic and instrumental ADLs ( bathing, dressing, grooming (skin inspection, skin and nail care), impaired functional ambulation, transfers and mobility, impaired home management, shopping, unable to cook and prep food, unable to drive, dependent for wbc propulsion, unable to work, impaired social participation, impaired body image  PRIOR LEVEL OF FUNCTION: Requires assistive devices and varying levels of assistance with basic and instrumental ADLs, Needs assistance with functional ambulation, all transfers and functional mobility. Unable to work. Housebound due to impaired mobility and relies on others for transportation  PATIENT GOALS: Initial evaluation goal: keep swelling from getting worse so I don't lose my leg. Re-eval gal: 06/14/22: get my legs small enough to be able to walk without a walker.   OBJECTIVE:   OBSERVATIONS /ASSESSMENTS:     FOTO (functional outcomes measure): Intake score 03/01/22 44/100%  Lymphedema Life Impact Scale (LLIS) 61.76% (How much lymphedema related problems interfered with your life last week)  BLE COMPARATIVE LIMB VOLUMETRICS:   LANDMARK LEFT  03/14/22 INITIAL   R LEG (A-D) 14499.2 ml  R THIGH (E-G) 17195.4 ml  R FULL LIMB (A-G) 31694.6 ml  Limb Volume differential (LVD)  %  Volume change since initial %  Volume change overall %  (Blank rows = not tested)   LANDMARK LEFT  05/09/22 10 Rx Visit  L LEG (A-D) 15607.0 ml  L THIGH (E-G)  21709.3 ml  L FULL LIMB (A-G)  37316.4 ml  Limb Volume differential (LVD)  %  Volume change since initial R Leg (A-D) Increased in volume by 7.6% L thigh (E-G) INC by 26.25% L Full limb (E-G) is INC by 17.7%.   Volume change overall %        TODAY'S TREATMENT:  LLE/LLQ MLD utilizing short neck sequence, deep abdominal lymphatics and functional inguinal LN. Skin care with low ph castor oil simultaneously during MLD to improve skin excursion and limit infection risk Multilayer knee length compression wraps to L Leg as established. PATIENT EDUCATION:  Continued Pt/ CG edu for lymphedema self care home program throughout session. Topics include outcome of comparative limb volumetrics- starting limb volume differentials (LVDs), technology and gradient techniques used for short stretch, multilayer compression wrapping, simple self-MLD, therapeutic lymphatic pumping exercises, skin/nail care, LE precautions,. compression garment recommendations and specifications, wear and care schedule and compression garment donning / doffing w assistive devices. Discussed progress towards all OT goals since commencing CDT. All questions answered to the Pt's satisfaction.  Person educated: Patient and family member Education method: Explanation, Demonstration, Verbal and Tactile cues, and Handouts Education comprehension: verbalized understanding and returned demonstration  HOME EXERCISE PROGRAM: Daily , multilayer compression bandages to one limb at a time only during Intensive Phase CDT. Daily day and HOS  compression garments during Self-management Phase. Daily skin care w low ph lotion Daily lymphatic pumping there ex, 1 set of 10 each element, in order, bilaterally Daily simple self-MLD  Lymphedema Self- Care Instructions   1. EXERCISE: Perform lymphatic pumping there ex at least 2 x a day while  wearing your compression wraps or garments. Perform 10 reps of each exercise bilaterally and be sure to perform them in order. Don't skip around!  OMIT PARTIAL SIT UPs.  2. MLD: Perform simple self-manual lymphatic drainage (MLD) at least once a day as directed. Take your time! Breathe! ;-)  3. If you have a Flexitouch advanced "pump" use it 1 time each day on a single limb only. The Flexitouch moves lymphatic fluid out of your affected body part and back to your heart, so DO NOT use the Flexi on 2 legs at a time, and DO NOT cues it on 2 legs on the same day. If you experience any atypical shortness of breath, sudden onset of pain, or feelings of heart arhythmia, or racing, discontinue use of the Flexitouch and report these symptoms to your doctor right away. Also, discontinue Flexi if you have an infection or a fever. It's OK to resume using the device 72 hours AFTER your first dose of oral antibiotic.   4. 4. WRAPS: Compression wraps are to be worn 23 hrs/ 7 days/wk during Intensive Phase of Complete Decongestive Therapy (CDT).Building tolerance may take time and practice, so don't get discouraged. If bandages begin to feel tight during periods of inactivity and/or during the night, try performing your exercises to loosen them. Do not leave short stretch wraps in place for > 23 hours. It is very important that you remove all wraps daily to inspect skin, bathe and perform skin care before reapplying your wraps.  5. Daytime GARMENTS/ HOS DEVICES: During the Self-Management Phase CDT your compression garments are to be worn during waking hours when active. Do NOT sleep in your garments!!  Don daytime garments first thing in the morning. Do not wear your HOS devices all day instead of garments. These will not contain your swelling.  6. PUT YOUR FEET UP! Elevate your feet and legs and feet to the level of your heart whenever you are sitting down.   7. SKIN: Carefully monitor skin condition and perform  impeccable hygiene daily. Bathe skin with mild soap and water and apply low pH lotion (aka Eucerin ) to improve hydration and limit infection risk.   ASSESSMENT: CLINICAL  IMPRESSION: Pt presents with compression wraps applied last OT session in place, despite directions to remove  wraps, inspect and clean skin daily, and reapply clean wraps. Pt confirms that her daughter is no longer assisting her with compression wrapping between sessions, so she is wearing them as long as possible, typically > 2 days and for as many as 4 days consecutively. Pt educated again that once limb swelling reduces inside of wraps , then wraps should be removed and re applied to facilitate additional volume reduction , and so on. Pt verbalized understanding that wraps should be changed daily to inspect skin, bathe and hydrate skin, and then re wrap to limit infection risk and risk of a wound forming without her knowledge. Cont as per POC. Despite this rational and these recommendations, Pt continues to leave wraps on longer than she should in an effort to improve her condition without assistance. We will monitor carefully and revisit these recommendations again ASAP to ensure Pt safety and best practice.   After MLD to LLE as established, re applied knee length compression wraps as per POC. Cont as per POC.   OBJECTIVE IMPAIRMENTS: Abnormal gait, decreased activity tolerance, decreased balance, decreased endurance, decreased knowledge of condition, decreased knowledge of use of DME, decreased mobility, difficulty walking, decreased ROM, decreased strength, dizziness, hypomobility, increased edema, increased fascial restrictions, impaired flexibility, impaired sensation, postural dysfunction, obesity, pain, and chronic, progressive swelling .   ACTIVITY LIMITATIONS: carrying, lifting, bending, standing, squatting, stairs, transfers, bed mobility, continence, bathing, toileting, dressing, hygiene/grooming, and caring for  others  PARTICIPATION LIMITATIONS: meal prep, cleaning, laundry, driving, shopping, community activity, occupation, yard work, school, church, and caring for others  PERSONAL FACTORS: 3+ contributing Co morbidities, limited transportation, consistent, ongoing availability of caregivers, financial limitations are also affecting patient's functional outcome.   REHAB POTENTIAL: Lymphedema management has a high burden of care, especially for Patients who have difficulty, or who are unable to reach their distal legs and feet to apply bandages, compression garments, to bathe, inspect skin and groom nails. In this case because Pt is unable to perform these activities, daily caregiver assistance with all lower extremity lymphedema self-care home program components is essential for achieving clinical success and ongoing self-management. Without consistent, ongoing, daily caregiver assistance for compression bandaging and garments, skin care, and MLD Pt's prognosis is poor. With consistent caregiver assistance with lymphedema home program Pt's prognosis for improved lymphedema control and reducing infection risk is guarded, at best, due to the advanced stage, multiple contributing co-morbidities, and sedentary lifestyle.  EVALUATION COMPLEXITY: High   GOALS: Goals reviewed with patient? YES  SHORT TERM GOALS: Target date: 6th OT visit   Pt will demonstrate understanding of lymphedema precautions and prevention strategies with modified independence using a printed reference to identify at least 5 precautions and discussing how s/he may implement them into daily life to reduce risk of progression with modified assistance. Baseline: Max A Goal status:05/08/22 ONGOING  2.   With Max caregiver assistance Pt will be able to apply multilayer, knee length, compression wraps using gradient techniques to decrease limb volume, to limit infection risk, and to limit lymphedema progression.  Baseline: Dependent Goal  status: 05/08/22 GOAL MET  LONG TERM GOALS: Target date: 05/30/22  Given this patient's Intake score of 44/100% on the functional outcomes FOTO tool, patient will experience an increase in function of 3  points to improve basic and instrumental ADLs performance, including lymphedema self-care. Baseline: 44/100 Goal status: 05/08/22 ONGOING  2.  Given this  patient's Intake score of TBA % on the Lymphedema Life Impact Scale (LLIS), patient will experience a reduction of at least 5% in her perceived level of functional impairment resulting from lymphedema to improve functional performance and quality of life (QOL). Baseline: TBA Goal status: 05/08/22 ONGOING  3.  With max caregiver assistance Pt will be able to don and doff appropriate compression garments and/or devices to control BLE lymphedema and to limit progression.  Baseline: Dependent Goal status: 05/08/22 ONGOING. Limb volume not reduced enough to date to consider garment measurement and fitting.  4.  With tolerance building strategies over time  Pt will be able to tolerate compression bandages and garments for at least 24 hours  to control BLE lymphedema and to limit progression.  Baseline: Max A Goal status: 05/08/22 GOAL MET for Bandages. ONGOING for garments.  5.  With Max caregiver assistance Pt will achieve and sustain no less than 85% compliance with all LE self-care home program components throughout CDT, including modified simple self-MLD, daily skin care and inspection, lymphatic pumping the ex and appropriate compression to limit lymphedema progression and to limit further functional decline. Baseline: Dependent Goal status: 05/08/22 ONGOING  6.  Pt will diligently perform skin care regime prescribed by her doctor to eliminate fungal infection and prevent reoccurrence for 90 days Baseline: Dependent Goal status: 05/08/22 ONGOING   OT PLAN OF CARE:  PT FREQUENCY: 2x/week  PT DURATION: other: 12 weeks and PRN  . 05/08/22 Due to  the severity of this case and double limb involvement it is expected that OT for CDT will quite a bit longer THAT INITIAL 12 WEEK POC  PLANNED INTERVENTIONS: Therapeutic exercises, Therapeutic activity, Patient/Family education, Self Care, DME instructions, Manual lymph drainage, Compression bandaging, Vasopneumatic device, Manual therapy, and skin care  PLAN FOR NEXT SESSION:  Skin care MLD Multilayer wraps Pt/ family edu Loel Dubonnet, MS, OTR/L, CLT-LANA 06/28/22 1:01 PM

## 2022-06-28 ENCOUNTER — Other Ambulatory Visit: Payer: Self-pay | Admitting: Family Medicine

## 2022-06-28 ENCOUNTER — Encounter: Payer: Medicare HMO | Admitting: Occupational Therapy

## 2022-06-28 DIAGNOSIS — D649 Anemia, unspecified: Secondary | ICD-10-CM

## 2022-06-29 ENCOUNTER — Encounter: Payer: Medicare HMO | Admitting: Occupational Therapy

## 2022-06-29 DIAGNOSIS — I890 Lymphedema, not elsewhere classified: Secondary | ICD-10-CM | POA: Diagnosis not present

## 2022-06-29 DIAGNOSIS — E559 Vitamin D deficiency, unspecified: Secondary | ICD-10-CM | POA: Diagnosis not present

## 2022-06-29 DIAGNOSIS — M0579 Rheumatoid arthritis with rheumatoid factor of multiple sites without organ or systems involvement: Secondary | ICD-10-CM | POA: Diagnosis not present

## 2022-06-29 DIAGNOSIS — Z6841 Body Mass Index (BMI) 40.0 and over, adult: Secondary | ICD-10-CM | POA: Diagnosis not present

## 2022-06-29 DIAGNOSIS — D6861 Antiphospholipid syndrome: Secondary | ICD-10-CM | POA: Diagnosis not present

## 2022-06-29 DIAGNOSIS — R5382 Chronic fatigue, unspecified: Secondary | ICD-10-CM | POA: Diagnosis not present

## 2022-06-30 ENCOUNTER — Encounter: Payer: Self-pay | Admitting: Occupational Therapy

## 2022-06-30 ENCOUNTER — Ambulatory Visit: Payer: Medicare HMO | Attending: Internal Medicine | Admitting: Occupational Therapy

## 2022-06-30 DIAGNOSIS — I890 Lymphedema, not elsewhere classified: Secondary | ICD-10-CM | POA: Insufficient documentation

## 2022-06-30 NOTE — Therapy (Signed)
OUTPATIENT OCCUPATIONAL THERAPY TREATMENT NOTE  BILATERAL  LOWER EXTREMITY LYMPHEDEMA  Patient Name: [name] MRN: [id number] DOB:09/21/1965, 57 y.o., female Today's Date: 06/30/2022   END OF SESSION:  OT End of Session - 06/30/22 1103     Visit Number 14    Number of Visits 36    Date for OT Re-Evaluation 09/13/22    OT Start Time 1103    Activity Tolerance Patient tolerated treatment well;No increased pain    Behavior During Therapy WFL for tasks assessed/performed              Past Medical History:  Diagnosis Date   Anemia    Anxiety    Arthritis    Asthma    hx as child - no prob as adult - no inhaler   Blood transfusion 01/22/11   transfusion 2 units at [facility]   Depression 08/2010   psych assessment   Dyspnea    Fibroid    Headache(784.0)    rx for imitrex - last one jan   Keloid    Lymphedema    Pulmonary embolism (HCC)    Past Surgical History:  Procedure Laterality Date   ABDOMINAL HYSTERECTOMY     COLONOSCOPY N/A 11/08/2017   Procedure: COLONOSCOPY;  Surgeon: West Bali, MD;  [facility];  Service: Endoscopy;  Laterality: N/A;  2:00pm   FLEXIBLE SIGMOIDOSCOPY N/A 09/17/2017   Procedure: FLEXIBLE SIGMOIDOSCOPY;  Surgeon: West Bali, MD;  [facility];  Service: Endoscopy;  Laterality: N/A;   HERNIA REPAIR  2009   umbilical   POLYPECTOMY  11/08/2017   Procedure: POLYPECTOMY;  Surgeon: West Bali, MD;  [facility];  Service: Endoscopy;;  ascending colon (CSx1), transverse colon (CS x1), splenic flexure (HSx1)   SVD     Spontaneous vaginal delivery; x 1   Patient Active Problem List   Diagnosis Date Noted   Rheumatoid arthritis (HCC) 03/06/2022   Lymphedema 03/06/2022   Morbid obesity (HCC) 09/14/2021   Chest pain 04/07/2021   Screening for malignant neoplasm of colon 03/09/2021   Constipation 03/09/2021   Iron deficiency anemia 03/09/2021   Body mass index (BMI) 45.0-49.9, adult (HCC)  08/20/2020   Chronic fatigue syndrome 08/20/2020   Polyarthralgia 08/20/2020   Vitamin D deficiency 08/20/2020   Cellulitis 12/31/2018   Elephantiasis 12/31/2018   Ischemic chest pain (HCC) 12/31/2018   History of pulmonary embolus (PE) 12/31/2018   Physical deconditioning 08/27/2018   Adenopathy 09/15/2017   Family history of colon cancer 08/01/2017   Taking multiple medications for chronic disease 08/01/2017   Axillary lymphadenopathy 01/26/2017   Urinary incontinence 01/25/2017   Hypokalemia 01/25/2017   Lobar pneumonia (HCC) 01/25/2017   Pressure ulcer of sacral region, stage 2 (HCC) 01/25/2017   Abnormal ECG 01/29/2013   Dyspnea    Menorrhagia with regular cycle 02/22/2011   Anemia 02/22/2011    PCP: Loreen Freud, NP  REFERRING PROVIDER: Geralynn Rile, DPM  REFERRING DIAG: I89.0  THERAPY DIAG:  Lymphedema, not elsewhere classified  ONSET DATE: 2018  SUBJECTIVE:  SUBJECTIVE STATEMENT:  [name] presents to Occupational Therapy for lymphedema care to BLE , stage 3, elephantiasis. Pt is currently undergoing Phase CDT to LLE. She rates LE-related pain at 4/10. Pt reports she has been experiencing a Lupus flare for the last week to weeks, and she is currently taking Prednisone, which is effective for pain control at present.  PERTINENT HISTORY:  Severe, stage IV, BLE Lymphedema (Elephantiasis [place]). ENV is a complication of sever, chronic, progressive Lymphedema.  HTN, blood clot disorder with PE x 2 in 2018, RA, Hx recurrent cellulitis, severe morbid obesity (body mass index > 40.4 kg/m2).RA, deconditioning, hx pressure ulcer sacrum, hx non-pressure leg wound, hx depression hx chronic fatigue, hx keloids PAIN:  Are you having pain? : yes- LE related 4/10 Pain  location: BLE Pain description: Lupus. LE Aggravating: weight bearing, functional upper extremity use against gravity Relieving factors: rest   PRECAUTIONS: Fall, recurrent infections, hx recurrent non-pressure leg wound;    IMPAIRMENTS AND FUNCTIONAL LIMITATIONS: difficulty walking, chronic, progressive limb swelling with associated pain and disfigurement, impaired balance, generalized debility, decreased BLE AROM, decreased strength, impaired endurance, thickened skin limits flexibility  impaired basic and instrumental ADLs ( bathing, dressing, grooming (skin inspection, skin and nail care), impaired functional ambulation, transfers and mobility, impaired home management, shopping, unable to cook and prep food, unable to drive, dependent for wbc propulsion, unable to work, impaired social participation, impaired body image  PRIOR LEVEL OF FUNCTION: Requires assistive devices and varying levels of assistance with basic and instrumental ADLs, Needs assistance with functional ambulation, all transfers and functional mobility. Unable to work. Housebound due to impaired mobility and relies on others for transportation  PATIENT GOALS: Initial evaluation goal: keep swelling from getting worse so I don't lose my leg. Re-eval gal: 06/14/22: get my legs small enough to be able to walk without a walker.   OBJECTIVE:   OBSERVATIONS /ASSESSMENTS:     FOTO (functional outcomes measure): Intake score 03/01/22 44/100%  Lymphedema Life Impact Scale (LLIS) 61.76% (How much lymphedema related problems interfered with your life last week)  BLE COMPARATIVE LIMB VOLUMETRICS:   LANDMARK LEFT  03/14/22 INITIAL   R LEG (A-D) 14499.2 ml  R THIGH (E-G) 17195.4 ml  R FULL LIMB (A-G) 31694.6 ml  Limb Volume differential (LVD)  %  Volume change since initial %  Volume change overall %  (Blank rows = not tested)   LANDMARK LEFT  05/09/22 10 Rx Visit  L LEG (A-D) 15607.0 ml  L THIGH (E-G)  21709.3 ml  L FULL  LIMB (A-G) 37316.4 ml  Limb Volume differential (LVD)  %  Volume change since initial R Leg (A-D) Increased in volume by 7.6% L thigh (E-G) INC by 26.25% L Full limb (E-G) is INC by 17.7%.   Volume change overall %        TODAY'S TREATMENT:  LLE/LLQ MLD utilizing short neck sequence, deep abdominal lymphatics and functional inguinal LN. Skin care with low ph castor oil simultaneously during MLD , and with Eucerin cream before wrapping to improve skin excursion and limit infection risk Multilayer knee length compression wraps to L Leg as established. PATIENT EDUCATION:  Continued Pt/ CG edu for lymphedema self care home program throughout session. Topics include outcome of comparative limb volumetrics- starting limb volume differentials (LVDs), technology and gradient techniques used for short stretch, multilayer compression wrapping, simple self-MLD, therapeutic lymphatic pumping exercises, skin/nail care, LE precautions,. compression garment recommendations and specifications, wear and care schedule and compression garment donning / doffing w assistive devices. Discussed progress towards all OT goals since commencing CDT. All questions answered to the Pt's satisfaction.  Person educated: Patient and family member Education method: Explanation, Demonstration, Verbal and Tactile cues, and Handouts Education comprehension: verbalized understanding and returned demonstration  HOME EXERCISE PROGRAM: Daily , multilayer compression bandages to one limb at a time only during Intensive Phase CDT. Daily day and HOS  compression garments during Self-management Phase. Daily skin care w low ph lotion Daily lymphatic pumping there ex, 1 set of 10 each element, in order, bilaterally Daily simple self-MLD  Lymphedema Self- Care Instructions   1. EXERCISE: Perform  lymphatic pumping there ex at least 2 x a day while wearing your compression wraps or garments. Perform 10 reps of each exercise bilaterally and be sure to perform them in order. Don't skip around!  OMIT PARTIAL SIT UPs.  2. MLD: Perform simple self-manual lymphatic drainage (MLD) at least once a day as directed. Take your time! Breathe! ;-)  3. If you have a Flexitouch advanced "pump" use it 1 time each day on a single limb only. The Flexitouch moves lymphatic fluid out of your affected body part and back to your heart, so DO NOT use the Flexi on 2 legs at a time, and DO NOT cues it on 2 legs on the same day. If you experience any atypical shortness of breath, sudden onset of pain, or feelings of heart arhythmia, or racing, discontinue use of the Flexitouch and report these symptoms to your doctor right away. Also, discontinue Flexi if you have an infection or a fever. It's OK to resume using the device 72 hours AFTER your first dose of oral antibiotic.   4. 4. WRAPS: Compression wraps are to be worn 23 hrs/ 7 days/wk during Intensive Phase of Complete Decongestive Therapy (CDT).Building tolerance may take time and practice, so don't get discouraged. If bandages begin to feel tight during periods of inactivity and/or during the night, try performing your exercises to loosen them. Do not leave short stretch wraps in place for > 23 hours. It is very important that you remove all wraps daily to inspect skin, bathe and perform skin care before reapplying your wraps.  5. Daytime GARMENTS/ HOS DEVICES: During the Self-Management Phase CDT your compression garments are to be worn during waking hours when active. Do NOT sleep in your garments!!  Don daytime garments first thing in the morning. Do not wear your HOS devices all day instead of garments. These will not contain your swelling.  6. PUT YOUR FEET UP! Elevate your feet and legs and feet to the level of your heart whenever you are sitting down.   7.  SKIN: Carefully monitor skin condition and perform impeccable hygiene daily. Bathe skin with mild soap and water and apply low pH lotion (aka Eucerin ) to improve hydration and  limit infection risk.   ASSESSMENT: CLINICAL IMPRESSION:  Pt edu re Velcro wrap style compression garment alternatives well received by patient despite less aesthetically pleasing than compression stockings. These alternative garments  may be necessary due to anticipated difficulty Pt will most likely have with donning and doffing traditional elastic stockings due to limited AROM and Lupus pain. Continued MLD, skin care and compression wrapping to L leg today. Limb volume continues to reduce . Skin condition remains unchanged. Cont as per POC.   After MLD to LLE as established, re applied knee length compression wraps as per POC. Cont as per POC.   OBJECTIVE IMPAIRMENTS: Abnormal gait, decreased activity tolerance, decreased balance, decreased endurance, decreased knowledge of condition, decreased knowledge of use of DME, decreased mobility, difficulty walking, decreased ROM, decreased strength, dizziness, hypomobility, increased edema, increased fascial restrictions, impaired flexibility, impaired sensation, postural dysfunction, obesity, pain, and chronic, progressive swelling .   ACTIVITY LIMITATIONS: carrying, lifting, bending, standing, squatting, stairs, transfers, bed mobility, continence, bathing, toileting, dressing, hygiene/grooming, and caring for others  PARTICIPATION LIMITATIONS: meal prep, cleaning, laundry, driving, shopping, community activity, occupation, yard work, school, church, and caring for others  PERSONAL FACTORS: 3+ contributing Co morbidities, limited transportation, consistent, ongoing availability of caregivers, financial limitations are also affecting patient's functional outcome.   REHAB POTENTIAL: Lymphedema management has a high burden of care, especially for Patients who have difficulty, or  who are unable to reach their distal legs and feet to apply bandages, compression garments, to bathe, inspect skin and groom nails. In this case because Pt is unable to perform these activities, daily caregiver assistance with all lower extremity lymphedema self-care home program components is essential for achieving clinical success and ongoing self-management. Without consistent, ongoing, daily caregiver assistance for compression bandaging and garments, skin care, and MLD Pt's prognosis is poor. With consistent caregiver assistance with lymphedema home program Pt's prognosis for improved lymphedema control and reducing infection risk is guarded, at best, due to the advanced stage, multiple contributing co-morbidities, and sedentary lifestyle.  EVALUATION COMPLEXITY: High   GOALS: Goals reviewed with patient? YES  SHORT TERM GOALS: Target date: 6th OT visit   Pt will demonstrate understanding of lymphedema precautions and prevention strategies with modified independence using a printed reference to identify at least 5 precautions and discussing how s/he may implement them into daily life to reduce risk of progression with modified assistance. Baseline: Max A Goal status:05/08/22 ONGOING  2.   With Max caregiver assistance Pt will be able to apply multilayer, knee length, compression wraps using gradient techniques to decrease limb volume, to limit infection risk, and to limit lymphedema progression.  Baseline: Dependent Goal status: 05/08/22 GOAL MET  LONG TERM GOALS: Target date: 05/30/22  Given this patient's Intake score of 44/100% on the functional outcomes FOTO tool, patient will experience an increase in function of 3  points to improve basic and instrumental ADLs performance, including lymphedema self-care. Baseline: 44/100 Goal status: 05/08/22 ONGOING  2.  Given this patient's Intake score of TBA % on the Lymphedema Life Impact Scale (LLIS), patient will experience a reduction of at  least 5% in her perceived level of functional impairment resulting from lymphedema to improve functional performance and quality of life (QOL). Baseline: TBA Goal status: 05/08/22 ONGOING  3.  With max caregiver assistance Pt will be able to don and doff appropriate compression garments and/or devices to control BLE lymphedema and to limit progression.  Baseline: Dependent Goal status: 05/08/22 ONGOING. Limb volume not reduced enough  to date to consider garment measurement and fitting.  4.  With tolerance building strategies over time  Pt will be able to tolerate compression bandages and garments for at least 24 hours  to control BLE lymphedema and to limit progression.  Baseline: Max A Goal status: 05/08/22 GOAL MET for Bandages. ONGOING for garments.  5.  With Max caregiver assistance Pt will achieve and sustain no less than 85% compliance with all LE self-care home program components throughout CDT, including modified simple self-MLD, daily skin care and inspection, lymphatic pumping the ex and appropriate compression to limit lymphedema progression and to limit further functional decline. Baseline: Dependent Goal status: 05/08/22 ONGOING  6.  Pt will diligently perform skin care regime prescribed by her doctor to eliminate fungal infection and prevent reoccurrence for 90 days Baseline: Dependent Goal status: 05/08/22 ONGOING   OT PLAN OF CARE:  PT FREQUENCY: 2x/week  PT DURATION: other: 12 weeks and PRN  . 05/08/22 Due to the severity of this case and double limb involvement it is expected that OT for CDT will quite a bit longer THAT INITIAL 12 WEEK POC  PLANNED INTERVENTIONS: Therapeutic exercises, Therapeutic activity, Patient/Family education, Self Care, DME instructions, Manual lymph drainage, Compression bandaging, Vasopneumatic device, Manual therapy, and skin care  PLAN FOR NEXT SESSION:  Skin care MLD Multilayer wraps Pt/ family edu Loel Dubonnet, MS, OTR/L,  CLT-LANA 06/30/22 11:04 AM

## 2022-07-04 ENCOUNTER — Encounter: Payer: Medicare HMO | Admitting: Occupational Therapy

## 2022-07-04 ENCOUNTER — Ambulatory Visit: Payer: Medicare HMO | Admitting: Occupational Therapy

## 2022-07-05 ENCOUNTER — Encounter: Payer: Medicare HMO | Admitting: Occupational Therapy

## 2022-07-06 ENCOUNTER — Ambulatory Visit: Payer: Medicare HMO | Admitting: Occupational Therapy

## 2022-07-06 ENCOUNTER — Encounter: Payer: Medicare HMO | Admitting: Occupational Therapy

## 2022-07-07 ENCOUNTER — Ambulatory Visit: Payer: Medicare HMO | Attending: Nurse Practitioner | Admitting: Nurse Practitioner

## 2022-07-07 ENCOUNTER — Encounter: Payer: Self-pay | Admitting: Nurse Practitioner

## 2022-07-07 VITALS — BP 112/71 | HR 92 | Ht 69.0 in | Wt 251.0 lb

## 2022-07-07 DIAGNOSIS — R7303 Prediabetes: Secondary | ICD-10-CM | POA: Diagnosis not present

## 2022-07-07 DIAGNOSIS — I10 Essential (primary) hypertension: Secondary | ICD-10-CM

## 2022-07-07 DIAGNOSIS — Z86711 Personal history of pulmonary embolism: Secondary | ICD-10-CM

## 2022-07-07 LAB — POCT GLYCOSYLATED HEMOGLOBIN (HGB A1C): HbA1c, POC (controlled diabetic range): 5.1 % (ref 0.0–7.0)

## 2022-07-07 MED ORDER — APIXABAN 5 MG PO TABS: 5.0000 mg | ORAL_TABLET | Freq: Two times a day (BID) | ORAL | 3 refills | Status: DC

## 2022-07-07 NOTE — Progress Notes (Signed)
Assessment & Plan:  [name] was seen today for prediabetes.  Diagnoses and all orders for this visit:  Primary hypertension Continue all antihypertensives as prescribed.  Reminded to bring in blood pressure log for follow  up appointment.  RECOMMENDATIONS: DASH/Mediterranean Diets are healthier choices for HTN.     Prediabetes -     POCT glycosylated hemoglobin (Hb A1C)  History of pulmonary embolus (PE) -     apixaban (ELIQUIS) 5 MG TABS tablet; Take 1 tablet (5 mg total) by mouth 2 (two) times daily.    Patient has been counseled on age-appropriate routine health concerns for screening and prevention. These are reviewed and up-to-date. Referrals have been placed accordingly. Immunizations are up-to-date or declined.    Subjective:   Chief Complaint  Patient presents with   Prediabetes   HPI [name] 57 y.o. female presents to office today for follow up to prediabetes and HTN  She has a past medical history of Anemia, Anxiety, OA,  Asthma, Urinary incontinence with foley in place,  Depression (08/2010), Uterine fibroids, Keloid, Lymphedema (followed by the lymphedema clinic), and Pulmonary embolism (on chronic Eliquis) positive rheumatoid factor (followed by rheumatology).   Patient has been counseled on age-appropriate routine health concerns for screening and prevention. These are reviewed and up-to-date. Referrals have been placed accordingly. Immunizations are up-to-date or declined.     MAMMOGRAM: UTD. Referral placed COLONOSCOPY: UTD PAP SMEAR: Hysterectomy: 2012   Her daughter is a certified PCA and Ms. Niemeyer is interested in obtaining PCS services and having her daughter as her PCA as she needs assistance with transportion to lyphmedema center 2 days per week and requires assistance with wrapping her legs 5/7 days.    Prediabetes Well-controlled with diet only at this time. Lab Results  Component Value Date   HGBA1C 5.1 07/07/2022    Lab Results   Component Value Date   HGBA1C 5.7 (H) 11/30/2021    HTN Blood pressure is well-controlled with losartan 25 mg daily. BP Readings from Last 3 Encounters:  07/07/22 112/71  04/25/22 (!) 113/58  04/24/22 135/79    Review of Systems  Constitutional:  Negative for fever, malaise/fatigue and weight loss.  HENT: Negative.  Negative for nosebleeds.   Eyes: Negative.  Negative for blurred vision, double vision and photophobia.  Respiratory: Negative.  Negative for cough and shortness of breath.   Cardiovascular:  Positive for leg swelling (Chronic lymphedema). Negative for chest pain and palpitations.  Gastrointestinal: Negative.  Negative for heartburn, nausea and vomiting.  Musculoskeletal: Negative.  Negative for myalgias.  Neurological: Negative.  Negative for dizziness, focal weakness, seizures and headaches.  Psychiatric/Behavioral: Negative.  Negative for suicidal ideas.     Past Medical History:  Diagnosis Date   Anemia    Anxiety    Arthritis    Asthma    hx as child - no prob as adult - no inhaler   Blood transfusion 01/22/11   transfusion 2 units at [facility]   Depression 08/2010   psych assessment   Dyspnea    Fibroid    Headache(784.0)    rx for imitrex - last one jan   Keloid    Lymphedema    Pulmonary embolism [facility])     Past Surgical History:  Procedure Laterality Date   ABDOMINAL HYSTERECTOMY     COLONOSCOPY N/A 11/08/2017   Procedure: COLONOSCOPY;  Surgeon: West Bali, MD;  [facility];  Service: Endoscopy;  Laterality: N/A;  2:00pm   FLEXIBLE  SIGMOIDOSCOPY N/A 09/17/2017   Procedure: FLEXIBLE SIGMOIDOSCOPY;  Surgeon: West Bali, MD;  [facility];  Service: Endoscopy;  Laterality: N/A;   HERNIA REPAIR  2009   umbilical   POLYPECTOMY  11/08/2017   Procedure: POLYPECTOMY;  Surgeon: West Bali, MD;  [facility];  Service: Endoscopy;;  ascending colon (CSx1), transverse colon (CS x1), splenic flexure (HSx1)   SVD      Spontaneous vaginal delivery; x 1    Family History  Problem Relation Age of Onset   Heart disease Mother    Hypertension Mother    Hypertension Father    Colon cancer Father 21       Passed away 67 yrs old   Hypertension Sister    Cancer Maternal Grandmother        gastric cancer   Cancer Maternal Grandfather        pancreatic cancer   Colon cancer Paternal Grandmother    Colon cancer Paternal Uncle    Colon polyps Neg Hx     Social History Reviewed with no changes to be made today.   Outpatient Medications Prior to Visit  Medication Sig Dispense Refill   albuterol (VENTOLIN HFA) 108 (90 Base) MCG/ACT inhaler Inhale 2 puffs into the lungs every 6 (six) hours as needed for wheezing or shortness of breath. 18 g 2   CIMZIA 2 X 200 MG/ML PSKT Inject into the skin every 14 (fourteen) days.     clotrimazole (CLOTRIMAZOLE ANTI-FUNGAL) 1 % cream Apply 1 Application topically 2 (two) times daily. 100 g 1   ergocalciferol (VITAMIN D2) 1.25 MG (50000 UT) capsule 1 capsule Orally q week for 30 days     famotidine (PEPCID) 40 MG tablet Take 1 tablet (40 mg total) by mouth daily. 90 tablet 1   leflunomide (ARAVA) 20 MG tablet 1 tablet Orally Once a day for 30 days     losartan (COZAAR) 25 MG tablet Take 1 tablet (25 mg total) by mouth daily. 90 tablet 3   Misc. Devices (BARIATRIC ROLLATOR) MISC Please provide patient with insurance approved bariatric rollator walker. I89.0, R53.81, R26.81 1 each 0   folic acid (FOLVITE) 1 MG tablet Take 1 tablet by mouth once daily (Patient not taking: Reported on 07/07/2022) 30 tablet 0   furosemide (LASIX) 20 MG tablet Take 1 tablet (20 mg total) by mouth daily. X 7 days then prn swelling (Patient not taking: Reported on 07/07/2022) 30 tablet 1   apixaban (ELIQUIS) 5 MG TABS tablet Take 1 tablet (5 mg total) by mouth 2 (two) times daily for 7 days. 14 tablet 0   methotrexate 50 MG/2ML injection Inject 20 mg into the skin once a week. (Patient not taking:  Reported on 07/07/2022)     No facility-administered medications prior to visit.    Allergies  Allergen Reactions   Abatacept Anaphylaxis    Other reaction(s): Anaphylaxis-Streptomycin Clickject Other reaction(s): Anaphylaxis-Streptomycin   Bee Venom Anaphylaxis   Augmentin [Amoxicillin-Pot Clavulanate] Hives, Itching and Other (See Comments)    Did PCN reaction causing immediate rash, facial/tongue/throat swelling, SOB or lightheadedness with hypotension: yes Did PCN reaction causing severe rash involving mucus membranes or skin necrosis: no Has patient had a PCN reaction that required hospitalization: in hospital Has patient had a PCN reaction occurring within the last 10 years: no If all of the above answers are "NO", then may proceed with Cephalosporin use.  Pt reports no recollection of any reactions when taking penicillin in  past   Latex Hives and Other (See Comments)    Local reaction (welts). Patient denies any wheezing or other reaction with latex exposure   Nulytely [Peg 3350-Kcl-Na Bicarb-Nacl]     NAUSEA AND VOMITING. MAY TOLERATE LOW VOLUME PREP.       Objective:    BP 112/71 (BP Location: Left Arm, Patient Position: Sitting, Cuff Size: Large)   Pulse 92   Ht 5\' 9"  (1.753 m)   Wt 251 lb (113.9 kg)   LMP 01/31/2011   SpO2 98%   BMI 37.07 kg/m  Wt Readings from Last 3 Encounters:  07/07/22 251 lb (113.9 kg)  04/26/22 (!) 327 lb (148.3 kg)  04/24/22 (!) 327 lb 12.8 oz (148.7 kg)    Physical Exam Vitals and nursing note reviewed.  Constitutional:      Appearance: She is well-developed.  HENT:     Head: Normocephalic and atraumatic.  Cardiovascular:     Rate and Rhythm: Normal rate and regular rhythm.     Heart sounds: Normal heart sounds. No murmur heard.    No friction rub. No gallop.  Pulmonary:     Effort: Pulmonary effort is normal. No tachypnea or respiratory distress.     Breath sounds: Normal breath sounds. No decreased breath sounds, wheezing,  rhonchi or rales.  Chest:     Chest wall: No tenderness.  Abdominal:     General: Bowel sounds are normal.     Palpations: Abdomen is soft.  Musculoskeletal:        General: Normal range of motion.     Cervical back: Normal range of motion.  Lymphadenopathy:     Comments: Chronic bilateral lower extremity lymphedema  Skin:    General: Skin is warm and dry.  Neurological:     Mental Status: She is alert and oriented to person, place, and time.     Coordination: Coordination normal.  Psychiatric:        Behavior: Behavior normal. Behavior is cooperative.        Thought Content: Thought content normal.        Judgment: Judgment normal.          Patient has been counseled extensively about nutrition and exercise as well as the importance of adherence with medications and regular follow-up. The patient was given clear instructions to go to ER or return to medical center if symptoms don't improve, worsen or new problems develop. The patient verbalized understanding.   Follow-up: Return in about 3 months (around 10/07/2022) for HTN.   Claiborne Rigg, FNP-BC [facility] [place], [place] [phone]   07/07/2022, 1:21 PM

## 2022-07-07 NOTE — Progress Notes (Signed)
Patient states that her daughter is trying to be her PCA. OTC sleep medicine not working

## 2022-07-10 ENCOUNTER — Telehealth: Payer: Self-pay

## 2022-07-10 DIAGNOSIS — I890 Lymphedema, not elsewhere classified: Secondary | ICD-10-CM

## 2022-07-10 NOTE — Telephone Encounter (Signed)
At the request of [name], NP, I called patient to discuss her need for assistance with her leg wraps.  She explained that her daughter has been taught how to do the wraps and is able to do them; but is not able to assist daily.  She also explained that she needs someone to go with her to her medical appointments and her daughter is not able to do that all of the time because she is working and has a family to care for.  The patient would like to know if her daughter could be paid to be her caregiver.  She has a cousin who is paid to care for her own mother who has metastatic breat cancer.    I explained the difference between [facility] and CAP services and CAP is the program that will pay family members to provide their care. To qualify for CAP, the individual's needs must meet the institutional level of care.  The patient agreed that she is not appropriate for CAP.  She also explained that she only has [place] Medicaid, not full Medicaid so she would not qualify for PCS.  Her income is about $1400/month and she is not sure if she would qualify for full Medicaid now that the eligibility criteria have changed. I told her that I could refer her to the [facility] Eligibility cases workers and have them contact her to see if she would qualify for full Medicaid. She was agreeable to that plan.  She was also agreeable to having me place a referral to [facility] care management to see if she is eligible for any additional benefits through her [facility].    She is seen at the [facility] in [place] and explained that they are in the process of trying to get her a new lymphadema pump.  She currently has one; but they told her it is not appropriate.   Referrals then placed to DSS and [facility] Care Management

## 2022-07-11 ENCOUNTER — Ambulatory Visit: Payer: Medicare HMO | Admitting: Occupational Therapy

## 2022-07-11 ENCOUNTER — Telehealth: Payer: Self-pay | Admitting: *Deleted

## 2022-07-11 ENCOUNTER — Telehealth: Payer: Self-pay | Admitting: Occupational Therapy

## 2022-07-11 ENCOUNTER — Encounter: Payer: Medicare HMO | Admitting: Occupational Therapy

## 2022-07-11 NOTE — Telephone Encounter (Signed)
Left VM regarding 3 missed OT appointments in a row without call to cancel or reschedule. Pt informed we can resume OT for Lymphedema care with a new referral.Per our attendance policy, All remaining scheduled visits are cancelled.

## 2022-07-11 NOTE — Progress Notes (Signed)
  Care Coordination   Note   07/11/2022 Name: [name] MRN: [id number] DOB: [birth year]  [name] is a 57 y.o. year old female who sees Claiborne Rigg, NP for primary care. I reached out to [name] by phone today to offer care coordination services.  [name] was given information about Care Coordination services today including:   The Care Coordination services include support from the care team which includes your Nurse Coordinator, Clinical Social Worker, or Pharmacist.  The Care Coordination team is here to help remove barriers to the health concerns and goals most important to you. Care Coordination services are voluntary, and the patient may decline or stop services at any time by request to their care team member.   Care Coordination Consent Status: Patient agreed to services and verbal consent obtained.   Follow up plan:  Telephone appointment with care coordination team member scheduled for:  07/12/22  Encounter Outcome:  Pt. Scheduled  [facility] Coordination Care Guide  Direct Dial: [phone]

## 2022-07-12 ENCOUNTER — Ambulatory Visit: Payer: Self-pay

## 2022-07-12 ENCOUNTER — Telehealth: Payer: Self-pay

## 2022-07-12 ENCOUNTER — Encounter: Payer: Medicare HMO | Admitting: Occupational Therapy

## 2022-07-12 DIAGNOSIS — R590 Localized enlarged lymph nodes: Secondary | ICD-10-CM

## 2022-07-12 DIAGNOSIS — I890 Lymphedema, not elsewhere classified: Secondary | ICD-10-CM

## 2022-07-12 NOTE — Patient Instructions (Signed)
Visit Information  Thank you for taking time to visit with me today. Please don't hesitate to contact me if I can be of assistance to you.   Following are the goals we discussed today:  -Contact your cousin to confirm if transportation can be provided to 5/16 appointment -Continue using [facility] for [place] transportation needs -Engage with the community resource care guide team to learn more about [employer] transportation benefit -Review resources provided via [employer] your primary care provider as needed -Engage with Medical illustrator during scheduling telephonic visit on 6/4 at 1pm   Our next appointment is by telephone on 6/19 at 1pm  Please call the care guide team at [phone] if you need to cancel or reschedule your appointment.   If you are experiencing a Mental Health or Behavioral Health Crisis or need someone to talk to, please call 911  Patient verbalizes understanding of instructions and care plan provided today and agrees to view in MyChart. Active MyChart status and patient understanding of how to access instructions and care plan via MyChart confirmed with patient.     Bevelyn Ngo, BSW, CDP Social Worker, Certified Dementia Practitioner [facility] Care Management  Care Coordination [phone]

## 2022-07-12 NOTE — Telephone Encounter (Signed)
Telephone encounter was:  Successful.  07/12/2022 Name: [name] MRN: [id number] DOB: [birth year]  [name] is a 57 y.o. year old female who is a primary care patient of Claiborne Rigg, NP . The community resource team was consulted for assistance with Transportation Needs   Care guide performed the following interventions: Made three-way call to [employer] with patient to confirm transportation benefit. Spoke with Cristal Deer who verified patient does not have a transportation benefit. I will send a request for 5/16 appointment to [facility]. Also sent message to Smitty Knudsen to find out how many rides [facility] can provide.  Follow Up Plan:   I will call patient once I receive confirmation from [employer].  07/12/2022  [name] DOB: [birth year] MRN: [id number]   RIDER WAIVER AND RELEASE OF LIABILITY  For the purposes of helping with transportation needs, [facility] partners with outside transportation providers (taxi companies, [place], Catering manager.) to give [employer] patients or other approved people the choice of on-demand rides [employer]") to our buildings for non-emergency visits.  By using [employer], I, the person signing this document, on behalf of myself and/or any legal minors (in my care using the [employer]), agree:  Science writer given to me are supplied by independent, outside transportation providers who do not work for, or have any affiliation with, [employer]. [facility] is not a transportation company. [facility] has no control over the quality or safety of the rides I get using [employer]. [facility] has no control over whether any outside ride will happen on time or not. [facility] gives no guarantee on the reliability, quality, safety, or availability on any rides, or that no mistakes will happen. I know and accept that traveling by vehicle (car, truck, SVU, Zenaida Niece, bus, taxi, etc.) has risks of serious injuries such as  disability, being paralyzed, and death. I know and agree the risk of using [employer] is mine alone, and not [employer]. Transport Services are provided "as is" and as are available. The transportation providers are in charge for all inspections and care of the vehicles used to provide these rides. I agree not to take legal action against [facility], its agents, employees, officers, directors, representatives, insurers, attorneys, assigns, successors, subsidiaries, and affiliates at any time for any reasons related directly or indirectly to using [employer]. I also agree not to take legal action against [facility] or its affiliates for any injury, death, or damage to property caused by or related to using [employer]. I have read this Waiver and Release of Liability, and I understand the terms used in it and their legal meaning. This Waiver is freely and voluntarily given with the understanding that my right (or any legal minors) to legal action against [facility] relating to [employer] is knowingly given up to use these services.   I attest that I read the Ride Waiver and Release of Liability to [name], gave Ms. Iglesia the opportunity to ask questions and answered the questions asked (if any). I affirm that [name] then provided consent for assistance with transportation.     Shamell Suarez D Maree Ainley Trinna Kunst [place]  [facility] Population Health Community Resource Care Guide   [email or web address]  ?? [phone]   Website: [email or web address]  [email or web address]

## 2022-07-12 NOTE — Patient Outreach (Signed)
Care Coordination   Initial Visit Note   07/12/2022 Name: [name] MRN: [id number] DOB: 05-06-1965  [name] is a 57 y.o. year old female who sees Lauren Rigg, NP for primary care. I spoke with  [name] by phone today.  What matters to the patients health and wellness today?  Resource education related to transportation, food, and caregiver assistance    Goals Addressed             This Visit's Progress    Care Coordination Activities       Care Coordination Interventions: SDo[facility] screening performed - identified challenges with transportation and food insecurity Transportation Determined the patient uses [facility] for appointments in [place] but has difficulty with transportation to the Lymphedema clinic in [place] Performed chart review to note patient has [employer]; advised the patient most [employer] plans offer transportation if it is arranged at least 3 business days prior to appointment Noted the patient is scheduled with the Lymphedema clinic on 5/16 - patient states her cousin may be able to take her but she is unsure Advised the patient to contact her cousin to confirm if transportation can be provided Urgent referral placed to community resource care guide team to assist with acute transportation if needed as well as to assist the patient with contacting [employer] to arrange transportation for future appointments Food Discussed the patient only eats one meal per day to ensure she does not run out of food Determined the patient attempted to apply for [employer]NS Benefits but was deemed ineligible by her caseworker Education provided on [employer] app for the patient to identify food pantry locations she may access - patient reports if her daughter is off work she would be able to assist with picking up food Educated the patient on [employer] food pantry delivery program for persons with disabilities and/or transportation  barriers. Provided the patient with the contact number to follow up on this resource to inquire if she is eligible Patient reports she is in need of caregiver resources to assist with bathing and laundry; patient is not eligible for Medicaid due to income level Advised the patient Medicare does not cover the cost of caregivers in the home unless she is active with home health; patient is not homebound and is actively engaged with outpatient services so she is not eligible for home health Provided education on the [facility] in home aid program advising there is an extensive waiting list - patient declines referral at this time Educated the patient on [facility] [place] - patient is not yet eligible for this service until she turns 57. Provided educational materials for patient to review. SW can assist with a referral to this program is desirable after patients next birthday Scheduled telephonic appointment with RN Care Manager to assist with disease management Collaboration with RN Care Manager to advise of interventions and plan for patient to remain engaged with the care coordination team         SDOH assessments and interventions completed:  Yes  SDOH Interventions Today    Flowsheet Row Most Recent Value  SDOH Interventions   Food Insecurity Interventions Other (Comment)  [[employer] manager,  [employer]]  Housing Interventions Intervention Not Indicated  Transportation Interventions Other (Comment), AMB Referral, [facility] ([facility] [employer])  [name],  referral to [facility] CG team,  education on [employer] benefit]  Utilities Interventions Intervention Not Indicated  Care Coordination Interventions:  Yes, provided   Interventions Today    Flowsheet Row Most Recent Value  Chronic Disease   Chronic disease during today's visit Other  General Interventions   General Interventions Discussed/Reviewed General Interventions Discussed, [employer], Doctor Visits, Communication with, Referral to Nurse  Doctor Visits Discussed/Reviewed Doctor Visits Reviewed  Communication with RN  Education Interventions   Education Provided Provided Education  Provided Verbal Education On [employer], Insurance Plans  Nutrition Interventions   Nutrition Discussed/Reviewed Nutrition Discussed        Follow up plan: Referral made to community resource care guide team to assist with transportation Follow up call scheduled for 6/4 with RN Care Manager [name]    Encounter Outcome:  Pt. Visit Completed   [name], Kenard Gower, CDP Social Worker, Certified Dementia Practitioner [facility] Care Management  Care Coordination [phone]

## 2022-07-12 NOTE — Telephone Encounter (Signed)
   Telephone encounter was:  Successful.  07/12/2022 Name: [name] MRN: [id number] DOB: 11-13-1965  [name] is a 57 y.o. year old female who is a primary care patient of Claiborne Rigg, NP . The community resource team was consulted for assistance with Transportation Needs   Care guide performed the following interventions: Spoke with patient to confirm pick-up time for tomorrow @ 10:20 am with [employer].  Patient received and confirmed text.   Follow Up Plan:  I will call and update the patient if remaining rides are approved by [facility].   Great news! [employer] has accepted ride [phone] for K. Hoctor on 07/13/2022. There is no further action required at this time. If you have any questions, please call the [facility] support team at [phone] and select Option 1 OR email [email or web address]. Have a great day! [facility] Health Support Team Ride Details: Roundtrip A-Leg Ride ID: [id number] Passenger Name: Aili Fiebelkorn Passenger Phone: [phone] Ride Date: 07/13/2022 Daryll Drown Time: 10:20 AM (EDT) [facility] Address: [address] [place], [place] [address] Drop-off Address: [address] [place], [place], [place] Transportation Type: Standard Vehicle: Door-to-Door Investment banker, corporate: Patient uses Proofreader off: [facility], [address], [place], [place] [address], [phone] B-Leg Ride ID: [id number] Daryll Drown Time: 11:59 PM Pickup Address: [address] [place], [place], [place] Drop-off Address: [address] [place], [place] [address] Transportation Type: Standard Vehicle: Door-to-Door   Leisure centre manager Health  [facility] Population Health [employer] Care Guide   [email or web address]  ?? [phone]   Website: [email or web address]  [email or web address]

## 2022-07-13 ENCOUNTER — Ambulatory Visit: Payer: Medicare HMO | Admitting: Occupational Therapy

## 2022-07-13 ENCOUNTER — Encounter: Payer: Medicare HMO | Admitting: Occupational Therapy

## 2022-07-13 DIAGNOSIS — I890 Lymphedema, not elsewhere classified: Secondary | ICD-10-CM

## 2022-07-13 NOTE — Therapy (Signed)
OUTPATIENT OCCUPATIONAL THERAPY TREATMENT NOTE  BILATERAL  LOWER EXTREMITY LYMPHEDEMA  Patient Name: [name] MRN: [id number] DOB:March 07, 1965, 57 y.o., female Today's Date: 07/13/2022   END OF SESSION:  OT End of Session - 07/13/22 1107     Visit Number 15    Number of Visits 36    Date for OT Re-Evaluation 09/13/22    OT Start Time 1107    OT Stop Time 1207    OT Time Calculation (min) 60 min    Activity Tolerance Patient tolerated treatment well;No increased pain    Behavior During Therapy WFL for tasks assessed/performed              Past Medical History:  Diagnosis Date   Anemia    Anxiety    Arthritis    Asthma    hx as child - no prob as adult - no inhaler   Blood transfusion 01/22/11   transfusion 2 units at [facility]   Depression 08/2010   psych assessment   Dyspnea    Fibroid    Headache(784.0)    rx for imitrex - last one jan   Keloid    Lymphedema    Pulmonary embolism (HCC)    Past Surgical History:  Procedure Laterality Date   ABDOMINAL HYSTERECTOMY     COLONOSCOPY N/A 11/08/2017   Procedure: COLONOSCOPY;  Surgeon: West Bali, MD;  [facility];  Service: Endoscopy;  Laterality: N/A;  2:00pm   FLEXIBLE SIGMOIDOSCOPY N/A 09/17/2017   Procedure: FLEXIBLE SIGMOIDOSCOPY;  Surgeon: West Bali, MD;  Location: [facility];  Service: Endoscopy;  Laterality: N/A;   HERNIA REPAIR  2009   umbilical   POLYPECTOMY  11/08/2017   Procedure: POLYPECTOMY;  Surgeon: West Bali, MD;  [facility]: [facility];  Service: Endoscopy;;  ascending colon (CSx1), transverse colon (CS x1), splenic flexure (HSx1)   SVD     Spontaneous vaginal delivery; x 1   Patient Active Problem List   Diagnosis Date Noted   Rheumatoid arthritis (HCC) 03/06/2022   Lymphedema 03/06/2022   Morbid obesity (HCC) 09/14/2021   Chest pain 04/07/2021   Screening for malignant neoplasm of colon 03/09/2021   Constipation 03/09/2021   Iron deficiency anemia  03/09/2021   Body mass index (BMI) 45.0-49.9, adult (HCC) 08/20/2020   Chronic fatigue syndrome 08/20/2020   Polyarthralgia 08/20/2020   Vitamin D deficiency 08/20/2020   Cellulitis 12/31/2018   Elephantiasis 12/31/2018   Ischemic chest pain (HCC) 12/31/2018   History of pulmonary embolus (PE) 12/31/2018   Physical deconditioning 08/27/2018   Adenopathy 09/15/2017   Family history of colon cancer 08/01/2017   Taking multiple medications for chronic disease 08/01/2017   Axillary lymphadenopathy 01/26/2017   Urinary incontinence 01/25/2017   Hypokalemia 01/25/2017   Lobar pneumonia (HCC) 01/25/2017   Pressure ulcer of sacral region, stage 2 (HCC) 01/25/2017   Abnormal ECG 01/29/2013   Dyspnea    Menorrhagia with regular cycle 02/22/2011   Anemia 02/22/2011    PCP: Loreen Freud, NP  REFERRING PROVIDER: Geralynn Rile, DPM  REFERRING DIAG: I89.0  THERAPY DIAG:  Lymphedema, not elsewhere classified  ONSET DATE: 2018  SUBJECTIVE:  SUBJECTIVE STATEMENT:  [name] presents to Occupational Therapy for lymphedema care to BLE , stage 3, elephantiasis. Pt is currently undergoing Phase CDT to LLE. Pt arrived at appointment 6 minutes late bc she was unable to find a volunteer at the desk when she entered the building. She walked to the lymphedema clinic and was increased foot pain when she arrived. Pt missed last 3 scheduled visits without calls to cancel or reschedule. Pt did not listen to the voicemail left yesterday before arriving at clinic today letting her know appointments were cancelled. Pt states she had to arrange for a ride today with a transportation company because family members have been unavailable. Transportation was late , and she  could not find a volunteer in the hospital to bring   her down to the clinic.  We discussed the new  REHAB attendance policy and reviewed the changes. She understands that 2 missed appointments without calling to chance, or reschedule results in existing appointments being cancelled. Also reviewed the excessive cancellation policy with Pt . Pt was upset after the excessive physical effort she made to be here this afternoon with the prospect of missing her treatment. We were able to move forward from attendance policy discussion and complete a significant amount of manual therapy despite all. Pt was able to relax fore remainder of session.  PERTINENT HISTORY:  Severe, stage IV, BLE Lymphedema (Elephantiasis [place]). ENV is a complication of sever, chronic, progressive Lymphedema.  HTN, blood clot disorder with PE x 2 in 2018, RA, Hx recurrent cellulitis, severe morbid obesity (body mass index > 40.4 kg/m2).RA, deconditioning, hx pressure ulcer sacrum, hx non-pressure leg wound, hx depression hx chronic fatigue, hx keloids PAIN:  Are you having pain? : yes- LE related 4/10 Pain location: BLE Pain description: Lupus. LE Aggravating: weight bearing, functional upper extremity use against gravity Relieving factors: rest   PRECAUTIONS: Fall, recurrent infections, hx recurrent non-pressure leg wound;    IMPAIRMENTS AND FUNCTIONAL LIMITATIONS: difficulty walking, chronic, progressive limb swelling with associated pain and disfigurement, impaired balance, generalized debility, decreased BLE AROM, decreased strength, impaired endurance, thickened skin limits flexibility  impaired basic and instrumental ADLs ( bathing, dressing, grooming (skin inspection, skin and nail care), impaired functional ambulation, transfers and mobility, impaired home management, shopping, unable to cook and prep food, unable to drive, dependent for wbc propulsion, unable to work, impaired social participation, impaired body image  PRIOR LEVEL OF FUNCTION: Requires  assistive devices and varying levels of assistance with basic and instrumental ADLs, Needs assistance with functional ambulation, all transfers and functional mobility. Unable to work. Housebound due to impaired mobility and relies on others for transportation  PATIENT GOALS: Initial evaluation goal: keep swelling from getting worse so I don't lose my leg. Re-eval gal: 06/14/22: get my legs small enough to be able to walk without a walker.   OBJECTIVE:   OBSERVATIONS /ASSESSMENTS:     FOTO (functional outcomes measure): Intake score 03/01/22 44/100%  Lymphedema Life Impact Scale (LLIS) 61.76% (How much lymphedema related problems interfered with your life last week)  BLE COMPARATIVE LIMB VOLUMETRICS:   LANDMARK LEFT  03/14/22 INITIAL   R LEG (A-D) 14499.2 ml  R THIGH (E-G) 17195.4 ml  R FULL LIMB (A-G) 31694.6 ml  Limb Volume differential (LVD)  %  Volume change since initial %  Volume change overall %  (Blank rows = not tested)   LANDMARK LEFT  05/09/22 10 Rx Visit  L LEG (A-D) 15607.0 ml  L THIGH (  E-G)  21709.3 ml  L FULL LIMB (A-G) 37316.4 ml  Limb Volume differential (LVD)  %  Volume change since initial R Leg (A-D) Increased in volume by 7.6% L thigh (E-G) INC by 26.25% L Full limb (E-G) is INC by 17.7%.   Volume change overall %        TODAY'S TREATMENT:                                                                                                                                         LLE/LLQ MLD utilizing short neck sequence, deep abdominal lymphatics and functional inguinal LN. Skin care with low ph castor oil simultaneously during MLD before re-wrapping to improve skin excursion and limit infection risk Multilayer knee length compression wraps to L Leg as established. PATIENT EDUCATION:  Reviewed attendance policy again. Reviewed changes in no show and excessive cancellations policy.  Continued Pt/ CG edu for lymphedema self care home program throughout session.  Topics include outcome of comparative limb volumetrics- starting limb volume differentials (LVDs), technology and gradient techniques used for short stretch, multilayer compression wrapping, simple self-MLD, therapeutic lymphatic pumping exercises, skin/nail care, LE precautions,. compression garment recommendations and specifications, wear and care schedule and compression garment donning / doffing w assistive devices. Discussed progress towards all OT goals since commencing CDT. All questions answered to the Pt's satisfaction.  Person educated: Patient and family member Education method: Explanation, Demonstration, Verbal and Tactile cues, and Handouts Education comprehension: verbalized understanding and returned demonstration  HOME EXERCISE PROGRAM: Daily , multilayer compression bandages to one limb at a time only during Intensive Phase CDT. Daily day and HOS  compression garments during Self-management Phase. Daily skin care w low ph lotion Daily lymphatic pumping there ex, 1 set of 10 each element, in order, bilaterally Daily simple self-MLD  Lymphedema Self- Care Instructions   1. EXERCISE: Perform lymphatic pumping there ex at least 2 x a day while wearing your compression wraps or garments. Perform 10 reps of each exercise bilaterally and be sure to perform them in order. Don't skip around!  OMIT PARTIAL SIT UPs.  2. MLD: Perform simple self-manual lymphatic drainage (MLD) at least once a day as directed. Take your time! Breathe! ;-)  3. If you have a Flexitouch advanced "pump" use it 1 time each day on a single limb only. The Flexitouch moves lymphatic fluid out of your affected body part and back to your heart, so DO NOT use the Flexi on 2 legs at a time, and DO NOT cues it on 2 legs on the same day. If you experience any atypical shortness of breath, sudden onset of pain, or feelings of heart arhythmia, or racing, discontinue use of the Flexitouch and report these symptoms to your  doctor right away. Also, discontinue Flexi if you have an infection or a fever. It's OK to resume using the device 72 hours AFTER  your first dose of oral antibiotic.   4. 4. WRAPS: Compression wraps are to be worn 23 hrs/ 7 days/wk during Intensive Phase of Complete Decongestive Therapy (CDT).Building tolerance may take time and practice, so don't get discouraged. If bandages begin to feel tight during periods of inactivity and/or during the night, try performing your exercises to loosen them. Do not leave short stretch wraps in place for > 23 hours. It is very important that you remove all wraps daily to inspect skin, bathe and perform skin care before reapplying your wraps.  5. Daytime GARMENTS/ HOS DEVICES: During the Self-Management Phase CDT your compression garments are to be worn during waking hours when active. Do NOT sleep in your garments!!  Don daytime garments first thing in the morning. Do not wear your HOS devices all day instead of garments. These will not contain your swelling.  6. PUT YOUR FEET UP! Elevate your feet and legs and feet to the level of your heart whenever you are sitting down.   7. SKIN: Carefully monitor skin condition and perform impeccable hygiene daily. Bathe skin with mild soap and water and apply low pH lotion (aka Eucerin ) to improve hydration and limit infection risk.   ASSESSMENT: CLINICAL IMPRESSION:  Pt is struggling with attendance at OT appointments due to inadequate consistent and reliable transportation. Pt misses OT appointments often without calling. This last episode she missed 3 consecutive visits without calling. Pt admits that she has no assistance at present with compression wrapping between visits. He daughter is no longer available. OT explained in common language why changing wraps daily is so important for volume reduction and limiting infection risk. She tells me she is working with a Radiation protection practitioner on having he daughter be paid to provide  assistance. She will keep me posted on her progress in that direction.    OBJECTIVE IMPAIRMENTS: Abnormal gait, decreased activity tolerance, decreased balance, decreased endurance, decreased knowledge of condition, decreased knowledge of use of DME, decreased mobility, difficulty walking, decreased ROM, decreased strength, dizziness, hypomobility, increased edema, increased fascial restrictions, impaired flexibility, impaired sensation, postural dysfunction, obesity, pain, and chronic, progressive swelling .   ACTIVITY LIMITATIONS: carrying, lifting, bending, standing, squatting, stairs, transfers, bed mobility, continence, bathing, toileting, dressing, hygiene/grooming, and caring for others  PARTICIPATION LIMITATIONS: meal prep, cleaning, laundry, driving, shopping, community activity, occupation, yard work, school, church, and caring for others  PERSONAL FACTORS: 3+ contributing Co morbidities, limited transportation, consistent, ongoing availability of caregivers, financial limitations are also affecting patient's functional outcome.   REHAB POTENTIAL: Lymphedema management has a high burden of care, especially for Patients who have difficulty, or who are unable to reach their distal legs and feet to apply bandages, compression garments, to bathe, inspect skin and groom nails. In this case because Pt is unable to perform these activities, daily caregiver assistance with all lower extremity lymphedema self-care home program components is essential for achieving clinical success and ongoing self-management. Without consistent, ongoing, daily caregiver assistance for compression bandaging and garments, skin care, and MLD Pt's prognosis is poor. With consistent caregiver assistance with lymphedema home program Pt's prognosis for improved lymphedema control and reducing infection risk is guarded, at best, due to the advanced stage, multiple contributing co-morbidities, and sedentary  lifestyle.  EVALUATION COMPLEXITY: High   GOALS: Goals reviewed with patient? YES  SHORT TERM GOALS: Target date: 6th OT visit   Pt will demonstrate understanding of lymphedema precautions and prevention strategies with modified independence using a printed reference to identify  at least 5 precautions and discussing how s/he may implement them into daily life to reduce risk of progression with modified assistance. Baseline: Max A Goal status:05/08/22 ONGOING  2.   With Max caregiver assistance Pt will be able to apply multilayer, knee length, compression wraps using gradient techniques to decrease limb volume, to limit infection risk, and to limit lymphedema progression.  Baseline: Dependent Goal status: 05/08/22 GOAL MET  LONG TERM GOALS: Target date: 05/30/22  Given this patient's Intake score of 44/100% on the functional outcomes FOTO tool, patient will experience an increase in function of 3  points to improve basic and instrumental ADLs performance, including lymphedema self-care. Baseline: 44/100 Goal status: 05/08/22 ONGOING  2.  Given this patient's Intake score of TBA % on the Lymphedema Life Impact Scale (LLIS), patient will experience a reduction of at least 5% in her perceived level of functional impairment resulting from lymphedema to improve functional performance and quality of life (QOL). Baseline: TBA Goal status: 05/08/22 ONGOING  3.  With max caregiver assistance Pt will be able to don and doff appropriate compression garments and/or devices to control BLE lymphedema and to limit progression.  Baseline: Dependent Goal status: 05/08/22 ONGOING. Limb volume not reduced enough to date to consider garment measurement and fitting.  4.  With tolerance building strategies over time  Pt will be able to tolerate compression bandages and garments for at least 24 hours  to control BLE lymphedema and to limit progression.  Baseline: Max A Goal status: 05/08/22 GOAL MET for Bandages.  ONGOING for garments.  5.  With Max caregiver assistance Pt will achieve and sustain no less than 85% compliance with all LE self-care home program components throughout CDT, including modified simple self-MLD, daily skin care and inspection, lymphatic pumping the ex and appropriate compression to limit lymphedema progression and to limit further functional decline. Baseline: Dependent Goal status: 05/08/22 ONGOING  6.  Pt will diligently perform skin care regime prescribed by her doctor to eliminate fungal infection and prevent reoccurrence for 90 days Baseline: Dependent Goal status: 05/08/22 ONGOING   OT PLAN OF CARE:  PT FREQUENCY: 2x/week  PT DURATION: other: 12 weeks and PRN  . 05/08/22 Due to the severity of this case and double limb involvement it is expected that OT for CDT will quite a bit longer THAT INITIAL 12 WEEK POC  PLANNED INTERVENTIONS: Therapeutic exercises, Therapeutic activity, Patient/Family education, Self Care, DME instructions, Manual lymph drainage, Compression bandaging, Vasopneumatic device, Manual therapy, and skin care  PLAN FOR NEXT SESSION:  Skin care MLD Multilayer wraps Pt/ family edu Loel Dubonnet, MS, OTR/L, CLT-LANA 07/13/22 1:45 PM

## 2022-07-14 ENCOUNTER — Telehealth: Payer: Self-pay

## 2022-07-14 NOTE — Telephone Encounter (Signed)
Telephone encounter was:  Successful.  07/14/2022 Name: HEAVYNN VANDERHYDE MRN: [id number] DOB: [birth year]  BAYLIE STARLIPER is a 57 y.o. year old female who is a primary care patient of Claiborne Rigg, NP . The community resource team was consulted for assistance with Transportation Needs   Care guide performed the following interventions: Spoke with patient to apologize for scheduling transportation for 5/22 rather than 5/21.  Patient's pickup time is 12:05 pm for 07/18/22 with [facility].  Follow Up Plan:  No further follow up planned at this time. The patient has been provided with needed resources.  Great news! [facility] has accepted ride [phone] for K. Karel on 07/18/2022. There is no further action required at this time. If you have any questions, please call the [facility] support team at [phone] and select Option 1 OR email [email or web address]. Have a great day! [facility] Support Team Ride Details: Roundtrip A-Leg Ride ID: [id number] Passenger Name: Gowri Goodin Passenger Phone: [phone] Ride Date: 07/18/2022 Daryll Drown Time: 12:05 PM (EDT) Pickup Address: [address] [place], [place] [address] Drop-off Address: [address], [place], [place], [place] Transportation Type: Standard Vehicle: Door-to-Door Investment banker, corporate: Patient uses a Proofreader off: [facility], [address] [place], [place] [address], [phone] B-Leg Ride ID: [id number] Daryll Drown Time: 11:59 PM Pickup Address: [address], [place], [place], [place] Drop-off Address: [address] [place], [place] [address] Transportation Type: Standard Vehicle: Door-to-Door   07/14/2022  ROMI FULLENWIDER DOB: 12-13-1965 MRN: [id number]   RIDER WAIVER AND RELEASE OF LIABILITY  For the purposes of helping with transportation needs, [facility] partners with outside transportation providers (taxi  companies, [employer], Catering manager.) to give [employer] patients or other approved people the choice of on-demand rides [employer]") to our buildings for non-emergency visits.  By using [employer], I, the person signing this document, on behalf of myself and/or any legal minors (in my care using the [employer]), agree:  Science writer given to me are supplied by independent, outside transportation providers who do not work for, or have any affiliation with, [employer]. [facility] is not a transportation company. [facility] has no control over the quality or safety of the rides I get using [employer]. [facility] has no control over whether any outside ride will happen on time or not. [facility] gives no guarantee on the reliability, quality, safety, or availability on any rides, or that no mistakes will happen. I know and accept that traveling by vehicle (car, truck, SVU, Zenaida Niece, bus, taxi, etc.) has risks of serious injuries such as disability, being paralyzed, and death. I know and agree the risk of using [employer] is mine alone, and not [employer]. Transport Services are provided "as is" and as are available. The transportation providers are in charge for all inspections and care of the vehicles used to provide these rides. I agree not to take legal action against [facility], its agents, employees, officers, directors, representatives, insurers, attorneys, assigns, successors, subsidiaries, and affiliates at any time for any reasons related directly or indirectly to using [employer]. I also agree not to take legal action against  or its affiliates for any injury, death, or damage to property caused by or related to using [employer]. I have read this Waiver and Release of Liability, and I understand the terms used in it and their legal meaning. This Waiver is freely and voluntarily given  with the understanding that my right (or any  legal minors) to legal action against [facility] relating to Transport Services is knowingly given up to use these services.   I attest that I read the Ride Waiver and Release of Liability to [name], gave Ms. Seabrooks the opportunity to ask questions and answered the questions asked (if any). I affirm that [name] then provided consent for assistance with transportation.     Oleda Borski D Kaylia Winborne Jianni Shelden Warrick  [facility] Population Health Community Resource Care Guide   [email or web address]  ?? [phone]   Website: [email or web address]  [email or web address]

## 2022-07-14 NOTE — Telephone Encounter (Signed)
Telephone encounter was:  Successful.  07/14/2022 Name: [name] MRN: [id number] DOB: 12/03/1965  [name] is a 57 y.o. year old female who is a primary care patient of Lauren Rigg, NP . The community resource [name] was consulted for assistance with Transportation Needs   Care guide performed the following interventions: Spoke with patient to confirm pickup time of 12:20pm on 07/19/22 with [facility].  Follow Up Plan:  No further follow up planned at this time. The patient has been provided with needed resources.  [name]. There is no further action required at this time. If you have any questions, please call the [facility] support [name] at [phone] and select Option 1 OR email [email or web address]. Have a Lauren day! [name] Ride Details: Roundtrip A-Leg Ride ID: [id number] Passenger Name: [name] Passenger Phone: [phone] Ride Date: [name] Daryll Drown Time: 12:20 PM (EDT) Pickup Address: [address] [place], [place] [address] Drop-off Address: [address] [place], [place], [place] Transportation Type: Standard Vehicle: Door-to-Door Investment banker, corporate: Patient uses a Proofreader off: [facility] at [facility], [address] [place], [place] [address], [phone] B-Leg Ride ID: [id number] Daryll Drown Time: 11:59 PM Pickup Address: [address] [place], [place], [place] Drop-off Address: [address] [place], [place] [address] Transportation Type: Standard Vehicle: Door-to-Door  07/14/2022  [name] DOB: 12/22/1965 MRN: [id number]   RIDER WAIVER AND RELEASE OF LIABILITY  For the purposes of helping with transportation needs, [facility] partners with outside transportation providers (taxi companies, [place], Catering manager.) to give [name] patients or other approved people  the choice of on-demand rides [employer]") to our buildings for non-emergency visits.  By using [employer], I, the person signing this document, on behalf of myself and/or any legal minors (in my care using the [employer]), agree:  Science writer given to me are supplied by independent, outside transportation providers who do not work for, or have any affiliation with, [name]. [place] is not a transportation company. [facility] has no control over the quality or safety of the rides I get using [employer]. [place] has no control over whether any outside ride will happen on time or not. [place] gives no guarantee on the reliability, quality, safety, or availability on any rides, or that no mistakes will happen. I know and accept that traveling by vehicle (car, truck, SVU, Zenaida Niece, bus, taxi, etc.) has risks of serious injuries such as disability, being paralyzed, and death. I know and agree the risk of using [employer] is mine alone, and not [employer]. Transport Services are provided "as is" and as are available. The transportation providers are in charge for all inspections and care of the vehicles used to provide these rides. I agree not to take legal action against [place], its agents, employees, officers, directors, representatives, insurers, attorneys, assigns, successors, subsidiaries, and affiliates at any time for any reasons related directly or indirectly to using [employer]. I also agree not to take legal action against [place] or its affiliates for any injury, death, or damage to property caused by or related to using [employer]. I have read this Waiver and Release of Liability, and I understand the terms used in it and their legal meaning. This Waiver is freely and voluntarily given with the understanding that my right (or any legal minors) to legal  action against [place] relating to [employer]  is knowingly given up to use these services.   I attest that I read the Ride Waiver and Release of Liability to [name], gave [name] the opportunity to ask questions and answered the questions asked (if any). I affirm that [name] then provided consent for assistance with transportation.     Lashan Macias D Malosi Hemstreet Nilani Hugill Welcome  [facility] Population [facility] Community Resource Care Guide   ??[email or web address]  ?? [phone]   Website: [email or web address]  [email or web address]

## 2022-07-17 ENCOUNTER — Ambulatory Visit: Payer: Self-pay

## 2022-07-17 ENCOUNTER — Telehealth: Payer: Self-pay | Admitting: Nurse Practitioner

## 2022-07-17 ENCOUNTER — Other Ambulatory Visit: Payer: Self-pay | Admitting: Nurse Practitioner

## 2022-07-17 DIAGNOSIS — I890 Lymphedema, not elsewhere classified: Secondary | ICD-10-CM

## 2022-07-17 NOTE — Telephone Encounter (Signed)
Copied from CRM [id number]. Topic: General - Inquiry >> Jul 17, 2022 10:48 AM [place] wrote: Reason for EAV:[id number] Little, a nurse with a [facility], requested a callback from Robyne Peers, RN. Stated she wanted to discuss the pt.   Please advise.

## 2022-07-17 NOTE — Patient Outreach (Signed)
  Care Coordination   Initial Visit Note   07/17/2022 Name: [name] MRN: [id number] DOB: 10-24-1965  [name] is a 57 y.o. year old female who sees Claiborne Rigg, NP for primary care. I spoke with  [name] by phone today.  What matters to the patients health and wellness today?  Patient would like to better manage her lymphedema.     Goals Addressed             This Visit's Progress    To better manage lymphedema to bilateral legs/feet       Care Coordination Interventions: Evaluation of current treatment plan related to lymphedema to bilateral legs  and patient's adherence to plan as established by provider Completed case discussion regarding patient's transportation barriers to transport to [facility] Completed successful call with patient to determined patient was referred to [facility] for lymphedema management due to [facility] does not currently offer this service in [place] for non-cancer patients Discussed patient is homebound, only leaving her home for MD appts, she was diagnosed with lymphedema approximately 5 years ago Discussed current treatment includes lymphedema drainage exercises and leg wraps, she feels her condition has greatly improved since starting this therapy  Determined patient is receptive to receiving lymphedema management at home if eligible  Discussed with patient this RN will collaborate with her PCP to request home health orders for lymphedema management, discussed this RN will follow up with her to provide a status update ASAP Reviewed and discussed upcoming scheduled outpatient visits for lymphedema management and determined patient will keep these appointments  Placed outbound call to Robyne Peers RN CM with [facility], provided my contact number requesting a return call Sent secure email to Erskine Squibb advising of patient's transportation conflict and advised patient is homebound and  should be eligible for home health for lymphedema management, advised patient is agreeable, requested further collaboration with PCP requesting orders for home health if she is agreeable Reply received from Robyne Peers RN advising she will collaborate with patient's PCP provider to discuss orders for home health     Interventions Today    Flowsheet Row Most Recent Value  Chronic Disease   Chronic disease during today's visit Other  [bilateral lymphedema to both legs]  General Interventions   General Interventions Discussed/Reviewed General Interventions Discussed, General Interventions Reviewed, Doctor Visits, Communication with  Doctor Visits Discussed/Reviewed Doctor Visits Discussed, Doctor Visits Reviewed, Specialist  Communication with RN, PCP/Specialists  Erskine Squibb Brazeau RN CM,  [facility]]  Education Interventions   Education Provided Provided Education  Provided Verbal Education On Other  [lympedema management]          SDOH assessments and interventions completed:  No     Care Coordination Interventions:  Yes, provided   Follow up plan: Follow up call scheduled for 07/21/22 @10  AM    Encounter Outcome:  Pt. Visit Completed

## 2022-07-17 NOTE — Patient Instructions (Signed)
Visit Information  Thank you for taking time to visit with me today. Please don't hesitate to contact me if I can be of assistance to you.   Following are the goals we discussed today:   Goals Addressed             This Visit's Progress    To better manage lymphedema to bilateral legs/feet       Care Coordination Interventions: Evaluation of current treatment plan related to lymphedema to bilateral legs  and patient's adherence to plan as established by provider Completed case discussion regarding patient's transportation barriers to transport to [facility] Completed successful call with patient to determined patient was referred to [facility] for lymphedema management due to [facility] does not currently offer this service in [place] for non-cancer patients Discussed patient is homebound, only leaving her home for MD appts, she was diagnosed with lymphedema approximately 5 years ago Discussed current treatment includes lymphedema drainage exercises and leg wraps, she feels her condition has greatly improved since starting this therapy  Determined patient is receptive to receiving lymphedema management at home if eligible  Discussed with patient this RN will collaborate with her PCP to request home health orders for lymphedema management, discussed this RN will follow up with her to provide a status update ASAP Reviewed and discussed upcoming scheduled outpatient visits for lymphedema management and determined patient will keep these appointments  Placed outbound call to Robyne Peers RN CM with [facility] and Wellness, provided my contact number requesting a return call Sent secure email to Erskine Squibb advising of patient's transportation conflict and advised patient is homebound and should be eligible for home health for lymphedema management, advised patient is agreeable, requested further collaboration with PCP requesting orders for home health if she is  agreeable Reply received from Robyne Peers RN advising she will collaborate with patient's PCP provider to discuss orders for home health            Our next appointment is by telephone on 07/21/22 at 10 AM  Please call the care guide team at [phone] if you need to cancel or reschedule your appointment.   If you are experiencing a Mental Health or Behavioral Health Crisis or need someone to talk to, please call 1-800-273-TALK (toll free, 24 hour hotline) go to [facility] [address], [place] [phone])  Patient verbalizes understanding of instructions and care plan provided today and agrees to view in MyChart. Active MyChart status and patient understanding of how to access instructions and care plan via MyChart confirmed with patient.     Delsa Sale, RN, BSN, CCM Care Management Coordinator [facility] Care Management  Direct Phone: [phone]

## 2022-07-17 NOTE — Telephone Encounter (Signed)
[name]-   Please see this request from Delsa Sale, RN/THN:  This patient is currently receiving outpatient lymphedema management at the [place] location for [facility] due there is not a [place] location for non-cancer patients. She is having transportation issues due [employer] no longer offers a transportation benefit. She will have biweekly visits from now until December with each visit costing approximately $172 for transportation. She is homebound and should qualify for home health for lymphedema management and patient is agreeable. Can you please discuss with patient's PCP, Loreen Freud NP regarding this concern and request a home health order be placed? She may also benefit from a waist high lymphedema pump per the current OT who will further assist with this DME if patient is eligible.

## 2022-07-18 ENCOUNTER — Encounter: Payer: Self-pay | Admitting: Occupational Therapy

## 2022-07-18 ENCOUNTER — Encounter: Payer: Medicare HMO | Admitting: Occupational Therapy

## 2022-07-18 ENCOUNTER — Ambulatory Visit: Payer: Medicare HMO | Admitting: Occupational Therapy

## 2022-07-18 DIAGNOSIS — I890 Lymphedema, not elsewhere classified: Secondary | ICD-10-CM | POA: Diagnosis not present

## 2022-07-18 NOTE — Therapy (Signed)
OUTPATIENT OCCUPATIONAL THERAPY TREATMENT NOTE  BILATERAL  LOWER EXTREMITY LYMPHEDEMA  Patient Name: [name] MRN: [id number] DOB:[birth year], 57 y.o., female Today's Date: 07/18/2022   END OF SESSION:  OT End of Session - 07/18/22 1322     Visit Number 16    Number of Visits 36    Date for OT Re-Evaluation 09/13/22    OT Start Time 0117    OT Stop Time 0220    OT Time Calculation (min) 63 min    Activity Tolerance Patient tolerated treatment well;No increased pain    Behavior During Therapy WFL for tasks assessed/performed              Past Medical History:  Diagnosis Date   Anemia    Anxiety    Arthritis    Asthma    hx as child - no prob as adult - no inhaler   Blood transfusion 01/22/11   transfusion 2 units at [facility]   Depression 08/2010   psych assessment   Dyspnea    Fibroid    Headache(784.0)    rx for imitrex - last one jan   Keloid    Lymphedema    Pulmonary embolism (HCC)    Past Surgical History:  Procedure Laterality Date   ABDOMINAL HYSTERECTOMY     COLONOSCOPY N/A 11/08/2017   Procedure: COLONOSCOPY;  Surgeon: West Bali, MD;  [facility];  Service: Endoscopy;  Laterality: N/A;  2:00pm   FLEXIBLE SIGMOIDOSCOPY N/A 09/17/2017   Procedure: FLEXIBLE SIGMOIDOSCOPY;  Surgeon: West Bali, MD;  Location: [facility];  Service: Endoscopy;  Laterality: N/A;   HERNIA REPAIR  2009   umbilical   POLYPECTOMY  11/08/2017   Procedure: POLYPECTOMY;  Surgeon: West Bali, MD;  [facility]: [facility];  Service: Endoscopy;;  ascending colon (CSx1), transverse colon (CS x1), splenic flexure (HSx1)   SVD     Spontaneous vaginal delivery; x 1   Patient Active Problem List   Diagnosis Date Noted   Rheumatoid arthritis (HCC) 03/06/2022   Lymphedema 03/06/2022   Morbid obesity (HCC) 09/14/2021   Chest pain 04/07/2021   Screening for malignant neoplasm of colon 03/09/2021   Constipation 03/09/2021   Iron deficiency anemia  03/09/2021   Body mass index (BMI) 45.0-49.9, adult (HCC) 08/20/2020   Chronic fatigue syndrome 08/20/2020   Polyarthralgia 08/20/2020   Vitamin D deficiency 08/20/2020   Cellulitis 12/31/2018   Elephantiasis 12/31/2018   Ischemic chest pain (HCC) 12/31/2018   History of pulmonary embolus (PE) 12/31/2018   Physical deconditioning 08/27/2018   Adenopathy 09/15/2017   Family history of colon cancer 08/01/2017   Taking multiple medications for chronic disease 08/01/2017   Axillary lymphadenopathy 01/26/2017   Urinary incontinence 01/25/2017   Hypokalemia 01/25/2017   Lobar pneumonia (HCC) 01/25/2017   Pressure ulcer of sacral region, stage 2 (HCC) 01/25/2017   Abnormal ECG 01/29/2013   Dyspnea    Menorrhagia with regular cycle 02/22/2011   Anemia 02/22/2011    PCP: Loreen Freud, NP  REFERRING PROVIDER: Geralynn Rile, DPM  REFERRING DIAG: I89.0  THERAPY DIAG:  Lymphedema, not elsewhere classified  ONSET DATE: 2018  SUBJECTIVE:  SUBJECTIVE STATEMENT:  [name] presents to Occupational Therapy for lymphedema care to BLE , stage 3, elephantiasis. Pt is 17 minutes lat for 1 hr session due to transportation;s tardiness. Pt endorses BLE lymphedema related pain, but does not rate it numerically. Pot brings clean and rolled compression wraps to clinic as instructed. Pt has no new complaints. We discussed briefly the attendance policy and importance of calling if she will miss, needs to cancel, or  will be arriving late to an appointment.  PERTINENT HISTORY:  Severe, stage IV, BLE Lymphedema (Elephantiasis [place]). ENV is a complication of sever, chronic, progressive Lymphedema (with hyperkeratosis, hyperplasia, hyperpigmentation, papillomatosis, skin breakdown with lymphorrhea,  elephantiasis) HTN, blood clot disorder with PE x 2 in 2018, RA, Hx recurrent cellulitis, severe morbid obesity (body mass index > 40.4 kg/m2).RA, deconditioning, hx pressure ulcer sacrum, hx non-pressure leg wound, hx depression hx chronic fatigue, hx keloids PAIN:  Are you having pain? : yes- LE related not rated /10 Pain location: BLE Pain description: Lupus. Lymphedema: heaviness, tightness, fullness, sore Aggravating: weight bearing, functional upper extremity use against gravity Relieving factors: rest, elevation   PRECAUTIONS: Fall, recurrent infections, hx recurrent non-pressure leg wound;    IMPAIRMENTS AND FUNCTIONAL LIMITATIONS: difficulty walking, chronic, progressive limb swelling with associated pain and disfigurement, impaired balance, generalized debility, decreased BLE AROM, decreased strength, impaired endurance, thickened skin limits flexibility  impaired basic and instrumental ADLs ( bathing, dressing, grooming (skin inspection, skin and nail care), impaired functional ambulation, transfers and mobility, impaired home management, shopping, unable to cook and prep food, unable to drive, dependent for wbc propulsion, unable to work, impaired social participation, impaired body image  PRIOR LEVEL OF FUNCTION: Requires assistive devices and varying levels of assistance with basic and instrumental ADLs, Needs assistance with functional ambulation, all transfers and functional mobility. Unable to work. Housebound due to impaired mobility and relies on others for transportation  PATIENT GOALS: Initial evaluation goal: keep swelling from getting worse so I don't lose my leg. Re-eval gal: 06/14/22: get my legs small enough to be able to walk without a walker.   OBJECTIVE:   OBSERVATIONS /ASSESSMENTS:     FOTO (functional outcomes measure): Intake score 03/01/22 44/100%  Lymphedema Life Impact Scale (LLIS) 61.76% (How much lymphedema related problems interfered with your life last  week)  BLE COMPARATIVE LIMB VOLUMETRICS:   LANDMARK LEFT  03/14/22 INITIAL   R LEG (A-D) 14499.2 ml  R THIGH (E-G) 17195.4 ml  R FULL LIMB (A-G) 31694.6 ml  Limb Volume differential (LVD)  %  Volume change since initial %  Volume change overall %  (Blank rows = not tested)   LANDMARK LEFT  05/09/22 10 Rx Visit  L LEG (A-D) 15607.0 ml  L THIGH (E-G)  21709.3 ml  L FULL LIMB (A-G) 37316.4 ml  Limb Volume differential (LVD)  %  Volume change since initial R Leg (A-D) Increased in volume by 7.6% L thigh (E-G) INC by 26.25% L Full limb (E-G) is INC by 17.7%.   Volume change overall %        TODAY'S TREATMENT:  LLE/LLQ MLD utilizing short neck sequence, deep abdominal lymphatics and functional inguinal LN. Skin care with low ph castor oil simultaneously during MLD before re-wrapping to improve skin excursion and limit infection risk Multilayer knee length compression wraps to L Leg as established.  PATIENT EDUCATION:  Reviewed attendance policy again. Reviewed changes in no show and excessive cancellations policy.  Continued Pt/ CG edu for lymphedema self care home program throughout session. Topics include outcome of comparative limb volumetrics- starting limb volume differentials (LVDs), technology and gradient techniques used for short stretch, multilayer compression wrapping, simple self-MLD, therapeutic lymphatic pumping exercises, skin/nail care, LE precautions,. compression garment recommendations and specifications, wear and care schedule and compression garment donning / doffing w assistive devices. Discussed progress towards all OT goals since commencing CDT. All questions answered to the Pt's satisfaction.  Person educated: Patient and family member Education method: Explanation, Demonstration, Verbal and Tactile cues, and Handouts Education  comprehension: verbalized understanding and returned demonstration  HOME EXERCISE PROGRAM: Daily , multilayer compression bandages to one limb at a time only during Intensive Phase CDT. Daily day and HOS  compression garments during Self-management Phase. Daily skin care w low ph lotion Daily lymphatic pumping there ex, 1 set of 10 each element, in order, bilaterally Daily simple self-MLD  Lymphedema Self- Care Instructions   1. EXERCISE: Perform lymphatic pumping there ex at least 2 x a day while wearing your compression wraps or garments. Perform 10 reps of each exercise bilaterally and be sure to perform them in order. Don't skip around!  OMIT PARTIAL SIT UPs.  2. MLD: Perform simple self-manual lymphatic drainage (MLD) at least once a day as directed. Take your time! Breathe! ;-)  3. If you have a Flexitouch advanced "pump" use it 1 time each day on a single limb only. The Flexitouch moves lymphatic fluid out of your affected body part and back to your heart, so DO NOT use the Flexi on 2 legs at a time, and DO NOT cues it on 2 legs on the same day. If you experience any atypical shortness of breath, sudden onset of pain, or feelings of heart arhythmia, or racing, discontinue use of the Flexitouch and report these symptoms to your doctor right away. Also, discontinue Flexi if you have an infection or a fever. It's OK to resume using the device 72 hours AFTER your first dose of oral antibiotic.   4. 4. WRAPS: Compression wraps are to be worn 23 hrs/ 7 days/wk during Intensive Phase of Complete Decongestive Therapy (CDT).Building tolerance may take time and practice, so don't get discouraged. If bandages begin to feel tight during periods of inactivity and/or during the night, try performing your exercises to loosen them. Do not leave short stretch wraps in place for > 23 hours. It is very important that you remove all wraps daily to inspect skin, bathe and perform skin care before reapplying your  wraps.  5. Daytime GARMENTS/ HOS DEVICES: During the Self-Management Phase CDT your compression garments are to be worn during waking hours when active. Do NOT sleep in your garments!!  Don daytime garments first thing in the morning. Do not wear your HOS devices all day instead of garments. These will not contain your swelling.  6. PUT YOUR FEET UP! Elevate your feet and legs and feet to the level of your heart whenever you are sitting down.   7. SKIN: Carefully monitor skin condition and perform impeccable hygiene daily. Bathe skin with mild soap and water and apply  low pH lotion (aka Eucerin ) to improve hydration and limit infection risk.   ASSESSMENT: CLINICAL IMPRESSION:  Pt 17 minutes late for a 60 minute OT session for lymphedema care since transpotation was late.Pt verbalized understanding that , while frequent no shows are devestating to her progress towards goals, being tardy  also presents a significant obstacle to her progress. Tardiness and/ or missing appointments due to transportation issues is not the patent's fault; we will record it here as it negatively impacts outcomes. Due to time constraints emphasis of session today was on skin care and compression wrapping only. Pt denies pain during therapy . Reapplied multilayer compression wraps below the knee. Cont as per POC.   OBJECTIVE IMPAIRMENTS: Abnormal gait, decreased activity tolerance, decreased balance, decreased endurance, decreased knowledge of condition, decreased knowledge of use of DME, decreased mobility, difficulty walking, decreased ROM, decreased strength, dizziness, hypomobility, increased edema, increased fascial restrictions, impaired flexibility, impaired sensation, postural dysfunction, obesity, pain, and chronic, progressive swelling .   ACTIVITY LIMITATIONS: carrying, lifting, bending, standing, squatting, stairs, transfers, bed mobility, continence, bathing, toileting, dressing, hygiene/grooming, and caring for  others  PARTICIPATION LIMITATIONS: meal prep, cleaning, laundry, driving, shopping, community activity, occupation, yard work, school, church, and caring for others  PERSONAL FACTORS: 3+ contributing Co morbidities, limited transportation, consistent, ongoing availability of caregivers, financial limitations are also affecting patient's functional outcome.   REHAB POTENTIAL: Lymphedema management has a high burden of care, especially for Patients who have difficulty, or who are unable to reach their distal legs and feet to apply bandages, compression garments, to bathe, inspect skin and groom nails. In this case because Pt is unable to perform these activities, daily caregiver assistance with all lower extremity lymphedema self-care home program components is essential for achieving clinical success and ongoing self-management. Without consistent, ongoing, daily caregiver assistance for compression bandaging and garments, skin care, and MLD Pt's prognosis is poor. With consistent caregiver assistance with lymphedema home program Pt's prognosis for improved lymphedema control and reducing infection risk is guarded, at best, due to the advanced stage, multiple contributing co-morbidities, and sedentary lifestyle.  EVALUATION COMPLEXITY: High   GOALS: Goals reviewed with patient? YES  SHORT TERM GOALS: Target date: 6th OT visit   Pt will demonstrate understanding of lymphedema precautions and prevention strategies with modified independence using a printed reference to identify at least 5 precautions and discussing how s/he may implement them into daily life to reduce risk of progression with modified assistance. Baseline: Max A Goal status:05/08/22 ONGOING  2.   With Max caregiver assistance Pt will be able to apply multilayer, knee length, compression wraps using gradient techniques to decrease limb volume, to limit infection risk, and to limit lymphedema progression.  Baseline: Dependent Goal  status: 05/08/22 GOAL MET  LONG TERM GOALS: Target date: 05/30/22  Given this patient's Intake score of 44/100% on the functional outcomes FOTO tool, patient will experience an increase in function of 3  points to improve basic and instrumental ADLs performance, including lymphedema self-care. Baseline: 44/100 Goal status: 05/08/22 ONGOING  2.  Given this patient's Intake score of TBA % on the Lymphedema Life Impact Scale (LLIS), patient will experience a reduction of at least 5% in her perceived level of functional impairment resulting from lymphedema to improve functional performance and quality of life (QOL). Baseline: TBA Goal status: 05/08/22 ONGOING  3.  With max caregiver assistance Pt will be able to don and doff appropriate compression garments and/or devices to control BLE lymphedema and to  limit progression.  Baseline: Dependent Goal status: 05/08/22 ONGOING. Limb volume not reduced enough to date to consider garment measurement and fitting.  4.  With tolerance building strategies over time  Pt will be able to tolerate compression bandages and garments for at least 24 hours  to control BLE lymphedema and to limit progression.  Baseline: Max A Goal status: 05/08/22 GOAL MET for Bandages. ONGOING for garments.  5.  With Max caregiver assistance Pt will achieve and sustain no less than 85% compliance with all LE self-care home program components throughout CDT, including modified simple self-MLD, daily skin care and inspection, lymphatic pumping the ex and appropriate compression to limit lymphedema progression and to limit further functional decline. Baseline: Dependent Goal status: 05/08/22 ONGOING  6.  Pt will diligently perform skin care regime prescribed by her doctor to eliminate fungal infection and prevent reoccurrence for 90 days Baseline: Dependent Goal status: 05/08/22 ONGOING   OT PLAN OF CARE:  PT FREQUENCY: 2x/week  PT DURATION: other: 12 weeks and PRN  . 05/08/22 Due to  the severity of this case and double limb involvement it is expected that OT for CDT will quite a bit longer THAT INITIAL 12 WEEK POC  PLANNED INTERVENTIONS: Therapeutic exercises, Therapeutic activity, Patient/Family education, Self Care, DME instructions, Manual lymph drainage, Compression bandaging, Vasopneumatic device, Manual therapy, and skin care  PLAN FOR NEXT SESSION:  Skin care MLD Multilayer wraps Pt/ family edu Loel Dubonnet, MS, OTR/L, CLT-LANA 07/18/22 2:24 PM

## 2022-07-19 ENCOUNTER — Telehealth: Payer: Self-pay

## 2022-07-19 ENCOUNTER — Ambulatory Visit: Payer: Medicare HMO | Admitting: Occupational Therapy

## 2022-07-19 ENCOUNTER — Encounter: Payer: Medicare HMO | Admitting: Occupational Therapy

## 2022-07-19 NOTE — Telephone Encounter (Signed)
Referral received for home health services.  I called the patient to inquire if she has a preference for home health agencies and she explained that she would like to put the referral on hold.  She said that her mother does not want anyone coming into her home. She explained that she is looking or her own place to live and will consider home health at that time.   She is also concerned about having her legs wrapped by someone other than the OT who has been working with her  She said that the wraps should be changed daily but she is not able to do it and the OT has been doing it twice weekly.  Her OT explained to her that the home health staff would need to be trained how to do the wraps and they most likely will not be coming daily to change them.   I informed [name], NP of patient's request to put home health services on hold for now.     The patient also said that her rheumatologist gave her a prescription for a new rollator  The one that she currently has is too short.  She wanted to know what to do with the prescription.  Her current walker is from [employer]. Instructed her to call them and inquire if they carry the one that she is requesting. If they carry it, she can give them the prescription.  She said she can get someone to take her to their office on [address] [facility] if needed.

## 2022-07-19 NOTE — Telephone Encounter (Signed)
Referral placed.

## 2022-07-20 ENCOUNTER — Telehealth: Payer: Self-pay

## 2022-07-20 ENCOUNTER — Ambulatory Visit: Payer: Medicare HMO | Admitting: Occupational Therapy

## 2022-07-20 ENCOUNTER — Encounter: Payer: Medicare HMO | Admitting: Occupational Therapy

## 2022-07-20 NOTE — Telephone Encounter (Signed)
   Telephone encounter was:  Successful.  07/20/2022 Name: [name] MRN: [id number] DOB: 12/22/1965  [name] is a 57 y.o. year old female who is a primary care patient of Claiborne Rigg, NP . The community resource team was consulted for assistance with Transportation Needs   Care guide performed the following interventions: Spoke with patient to determine if her family can assist her transportation to her appontments in [place].  Patient stated that she would check with her family to see if they can transport her.  She will call me back to let me know.   Follow Up Plan:  Client will call me by tomorrow to update me regarding transportation assistance from family. I will update Melissa Nettles.  Hattie Aguinaldo [facility]  [facility] Population Health Community Resource Care Guide   [email or web address]  ?? [phone]   Website: [email or web address]  [email or web address]

## 2022-07-21 ENCOUNTER — Ambulatory Visit: Payer: Self-pay

## 2022-07-21 NOTE — Patient Instructions (Signed)
Visit Information  Thank you for taking time to visit with me today. Please don't hesitate to contact me if I can be of assistance to you.   Following are the goals we discussed today:   Goals Addressed             This Visit's Progress    To better manage lymphedema to bilateral legs/feet       Care Coordination Interventions: Evaluation of current treatment plan related to lymphedema to bilateral legs  and patient's adherence to plan as established by provider Determined patient's cousin will provide transportation to one of her weekly lymphedema visits Discussed with patient she was advised by [name], [facility] Resources Care Guide, [facility] may be able to cover the additional weekly visit to the [place] lymphedema clinic, she will ask her aunt to help if [facility] is unable to assist, although this is not a reliable source for her  Reviewed and discussed patient is adherent with following the massage technique of her thighs and legs to help with symptom management as directed by OT Determined there is no one in the home with patient to assist with leg wraps, she is unable elevate her lower extremities with the exception of lying in her bed with her feet elevated  Discussed with patient her plan to ask her OT about recommendations for DME to help with leg elevation and or she may consider a hospital bed if able to have leg elevation Determined patient will contact the vendor provided by OT to ask if she meets criteria for a waist high lymphedema pump and discuss next steps Discussed patient will keep this RN informed if further assistance is needed with obtaining DME           Our next appointment is by telephone on 08/01/22 at 1:00 PM  Please call the care guide team at [phone] if you need to cancel or reschedule your appointment.   If you are experiencing a Mental Health or Behavioral Health Crisis or need someone to talk to, please call 1-800-273-TALK (toll free, 24 hour hotline) go  to [facility] [address] [place] [phone])  Patient verbalizes understanding of instructions and care plan provided today and agrees to view in MyChart. Active MyChart status and patient understanding of how to access instructions and care plan via MyChart confirmed with patient.     Delsa Sale, RN, BSN, CCM Care Management Coordinator [facility] Care Management Direct Phone: [phone]

## 2022-07-21 NOTE — Patient Outreach (Signed)
  Care Coordination   Follow Up Visit Note   07/21/2022 Name: [name] MRN: [id number] DOB: 1965/05/12  [name] is a 56 y.o. year old female who sees Lauren Rigg, NP for primary care. I spoke with  [name] by phone today.  What matters to the patients health and wellness today?  Patient would like to continue her lymphedema therapy with good effectiveness.     Goals Addressed             This Visit's Progress    To better manage lymphedema to bilateral legs/feet       Care Coordination Interventions: Evaluation of current treatment plan related to lymphedema to bilateral legs  and patient's adherence to plan as established by provider Determined patient's cousin will provide transportation to one of her weekly lymphedema visits Discussed with patient she was advised by [name], [facility] Resources Care Guide, [facility] may be able to cover the additional weekly visit to the  lymphedema clinic, she will ask her aunt to help if [facility] is unable to assist, although this is not a reliable source for her  Reviewed and discussed patient is adherent with following the massage technique of her thighs and legs to help with symptom management as directed by OT Determined there is no one in the home with patient to assist with leg wraps, she is unable elevate her lower extremities with the exception of lying in her bed with her feet elevated  Discussed with patient her plan to ask her OT about recommendations for DME to help with leg elevation and or she may consider a hospital bed if able to have leg elevation Determined patient will contact the vendor provided by OT to ask if she meets criteria for a waist high lymphedema pump and discuss next steps Discussed patient will keep this RN informed if further assistance is needed with obtaining DME       Interventions Today    Flowsheet Row Most Recent Value  Chronic Disease   Chronic disease during today's visit  Other  [lymphedema]  General Interventions   General Interventions Discussed/Reviewed General Interventions Discussed, General Interventions Reviewed, Doctor Visits, Communication with  Doctor Visits Discussed/Reviewed Doctor Visits Discussed, Doctor Visits Reviewed, Specialist  [lymphedema clinic]  Communication with Social Work  Education Interventions   Education Provided Provided Education  Provided Verbal Education On Exercise  [massage therapy lymphedema]          SDOH assessments and interventions completed:  No     Care Coordination Interventions:  Yes, provided   Follow up plan: Follow up call scheduled for 08/01/22 @ 1:00 PM    Encounter Outcome:  Pt. Visit Completed

## 2022-07-21 NOTE — Patient Outreach (Signed)
  Care Coordination   Follow Up Visit Note   07/21/2022 Name: [name] MRN: [id number] DOB: Aug 24, 1965  [name] is a 57 y.o. year old female who sees Claiborne Rigg, NP for primary care. I spoke with  [name] by phone today.  What matters to the patients health and wellness today?  Obtain transportation resources for [facility] appointments    Goals Addressed             This Visit's Progress    Care Coordination Activities       Care Coordination Interventions: Collaboration with RN Care Manager who advises the patient is still working to determine transportation resource options for her trips to the Lymphedema Clinic in [facility] Collaboration with Germain Osgood with [facility] and [employer] who advises this service does take eligible participants to appointments in [facility] provided to the patient regarding transportation program  Application completed and submitted to [employer], Solicitor to determine if the patient is eligible for transportation services          SDOH assessments and interventions completed:  No     Care Coordination Interventions:  Yes, provided   Interventions Today    Flowsheet Row Most Recent Value  Chronic Disease   Chronic disease during today's visit Other  General Interventions   General Interventions Discussed/Reviewed General Interventions Reviewed, [employer], Communication with  [spoke with Germain Osgood with [facility]]        Follow up plan:  SW will continue to follow    Encounter Outcome:  Pt. Visit Completed   Bevelyn Ngo, Kenard Gower, CDP Social Worker, Certified Dementia Practitioner [facility] Care Management  Care Coordination [phone]

## 2022-07-25 ENCOUNTER — Encounter: Payer: Medicare HMO | Admitting: Occupational Therapy

## 2022-07-25 ENCOUNTER — Other Ambulatory Visit: Payer: Self-pay | Admitting: Family Medicine

## 2022-07-25 ENCOUNTER — Ambulatory Visit: Payer: Medicare HMO | Admitting: Occupational Therapy

## 2022-07-25 DIAGNOSIS — I890 Lymphedema, not elsewhere classified: Secondary | ICD-10-CM

## 2022-07-25 DIAGNOSIS — D649 Anemia, unspecified: Secondary | ICD-10-CM

## 2022-07-25 NOTE — Therapy (Signed)
OUTPATIENT OCCUPATIONAL THERAPY TREATMENT NOTE  BILATERAL  LOWER EXTREMITY LYMPHEDEMA  Patient Name: [name] MRN: [id number] DOB:1965-11-17, 57 y.o., female Today's Date: 07/25/2022   END OF SESSION:  OT End of Session - 07/25/22 1312     Visit Number 17    Number of Visits 36    Date for OT Re-Evaluation 09/13/22    OT Start Time 0105   OT Stop Time 0210    OT Time Calculation (min) 65 min    Activity Tolerance Patient tolerated treatment well;No increased pain    Behavior During Therapy WFL for tasks assessed/performed              Past Medical History:  Diagnosis Date   Anemia    Anxiety    Arthritis    Asthma    hx as child - no prob as adult - no inhaler   Blood transfusion 01/22/11   transfusion 2 units at [facility]   Depression 08/2010   psych assessment   Dyspnea    Fibroid    Headache(784.0)    rx for imitrex - last one jan   Keloid    Lymphedema    Pulmonary embolism (HCC)    Past Surgical History:  Procedure Laterality Date   ABDOMINAL HYSTERECTOMY     COLONOSCOPY N/A 11/08/2017   Procedure: COLONOSCOPY;  Surgeon: Lauren Bali, MD;  [facility];  Service: Endoscopy;  Laterality: N/A;  2:00pm   FLEXIBLE SIGMOIDOSCOPY N/A 09/17/2017   Procedure: FLEXIBLE SIGMOIDOSCOPY;  Surgeon: Lauren Bali, MD;  Location: [facility];  Service: Endoscopy;  Laterality: N/A;   HERNIA REPAIR  2009   umbilical   POLYPECTOMY  11/08/2017   Procedure: POLYPECTOMY;  Surgeon: Lauren Bali, MD;  Location: [facility];  Service: Endoscopy;;  ascending colon (CSx1), transverse colon (CS x1), splenic flexure (HSx1)   SVD     Spontaneous vaginal delivery; x 1   Patient Active Problem List   Diagnosis Date Noted   Rheumatoid arthritis (HCC) 03/06/2022   Lymphedema 03/06/2022   Morbid obesity (HCC) 09/14/2021   Chest pain 04/07/2021   Screening for malignant neoplasm of colon 03/09/2021   Constipation 03/09/2021   Iron deficiency anemia  03/09/2021   Body mass index (BMI) 45.0-49.9, adult (HCC) 08/20/2020   Chronic fatigue syndrome 08/20/2020   Polyarthralgia 08/20/2020   Vitamin D deficiency 08/20/2020   Cellulitis 12/31/2018   Elephantiasis 12/31/2018   Ischemic chest pain (HCC) 12/31/2018   History of pulmonary embolus (PE) 12/31/2018   Physical deconditioning 08/27/2018   Adenopathy 09/15/2017   Family history of colon cancer 08/01/2017   Taking multiple medications for chronic disease 08/01/2017   Axillary lymphadenopathy 01/26/2017   Urinary incontinence 01/25/2017   Hypokalemia 01/25/2017   Lobar pneumonia (HCC) 01/25/2017   Pressure ulcer of sacral region, stage 2 (HCC) 01/25/2017   Abnormal ECG 01/29/2013   Dyspnea    Menorrhagia with regular cycle 02/22/2011   Anemia 02/22/2011    PCP: Lauren Freud, NP  REFERRING PROVIDER: Geralynn [name], DPM  REFERRING DIAG: I89.0  THERAPY DIAG:  Lymphedema, not elsewhere classified  ONSET DATE: 2018  SUBJECTIVE:  SUBJECTIVE STATEMENT:  [name] presents to Occupational Therapy for lymphedema care to BLE , stage 3, elephantiasis. Pt is on time for 1 hr  Rx session.  Pt reports she left compression wraps in place  for 5 days after last session until leg got very itchy, then she took it off. Pt was without compression wraps for several additional days.  Pt brings clean and rolled compression wraps to clinic as instructed. Pt reports she continues to work on transportation coverage for upcoming visits.  PERTINENT HISTORY:  Severe, stage IV, BLE Lymphedema (Elephantiasis [place]). ENV is a complication of sever, chronic, progressive Lymphedema (with hyperkeratosis, hyperplasia, hyperpigmentation, papillomatosis, skin breakdown with lymphorrhea, elephantiasis) HTN,  blood clot disorder with PE x 2 in 2018, RA, Hx recurrent cellulitis, severe morbid obesity (body mass index > 40.4 kg/m2).RA, deconditioning, hx pressure ulcer sacrum, hx non-pressure leg wound, hx depression hx chronic fatigue, hx keloids PAIN:  Are you having pain? : yes- LE related not rated /10 Pain location: BLE Pain description: Lupus. Lymphedema: heaviness, tightness, fullness, sore Aggravating: weight bearing, functional upper extremity use against gravity Relieving factors: rest, elevation   PRECAUTIONS: Fall, recurrent infections, hx recurrent non-pressure leg wound;    IMPAIRMENTS AND FUNCTIONAL LIMITATIONS: difficulty walking, chronic, progressive limb swelling with associated pain and disfigurement, impaired balance, generalized debility, decreased BLE AROM, decreased strength, impaired endurance, thickened skin limits flexibility  impaired basic and instrumental ADLs ( bathing, dressing, grooming (skin inspection, skin and nail care), impaired functional ambulation, transfers and mobility, impaired home management, shopping, unable to cook and prep food, unable to drive, dependent for wbc propulsion, unable to work, impaired social participation, impaired body image  PRIOR LEVEL OF FUNCTION: Requires assistive devices and varying levels of assistance with basic and instrumental ADLs, Needs assistance with functional ambulation, all transfers and functional mobility. Unable to work. Housebound due to impaired mobility and relies on others for transportation  PATIENT GOALS: Initial evaluation goal: keep swelling from getting worse so I don't lose my leg. Re-eval gal: 06/14/22: get my legs small enough to be able to walk without a walker.   OBJECTIVE:   OBSERVATIONS /ASSESSMENTS:     FOTO (functional outcomes measure): Intake score 03/01/22 44/100%  Lymphedema Life Impact Scale (LLIS) 61.76% (How much lymphedema related problems interfered with your life last week)  BLE  COMPARATIVE LIMB VOLUMETRICS:   LANDMARK LEFT  03/14/22 INITIAL   R LEG (A-D) 14499.2 ml  R THIGH (E-G) 17195.4 ml  R FULL LIMB (A-G) 31694.6 ml  Limb Volume differential (LVD)  %  Volume change since initial %  Volume change overall %  (Blank rows = not tested)   LANDMARK LEFT  05/09/22 10 Rx Visit  L LEG (A-D) 15607.0 ml  L THIGH (E-G)  21709.3 ml  L FULL LIMB (A-G) 37316.4 ml  Limb Volume differential (LVD)  %  Volume change since initial R Leg (A-D) Increased in volume by 7.6% L thigh (E-G) INC by 26.25% L Full limb (E-G) is INC by 17.7%.   Volume change overall %        TODAY'S TREATMENT:  LLE/LLQ MLD utilizing short neck sequence, deep abdominal lymphatics and functional inguinal LN. Skin care with low ph castor oil simultaneously during MLD before re-wrapping to improve skin excursion and limit infection risk Multilayer knee length compression wraps to L Leg as established.  PATIENT EDUCATION:  Pt edu for adjustable CircAid compression garment alternatives as possible solution for lack of compression between sessions. Continued Pt/ CG edu for lymphedema self care home program throughout session. Topics include outcome of comparative limb volumetrics- starting limb volume differentials (LVDs), technology and gradient techniques used for short stretch, multilayer compression wrapping, simple self-MLD, therapeutic lymphatic pumping exercises, skin/nail care, LE precautions,. compression garment recommendations and specifications, wear and care schedule and compression garment donning / doffing w assistive devices. Discussed progress towards all OT goals since commencing CDT. All questions answered to the Pt's satisfaction.  Person educated: Patient and family member Education method: Explanation, Demonstration, Verbal and Tactile cues, and  Handouts Education comprehension: verbalized understanding and returned demonstration  HOME EXERCISE PROGRAM: Daily , multilayer compression bandages to one limb at a time only during Intensive Phase CDT. Daily day and HOS  compression garments during Self-management Phase. Daily skin care w low ph lotion Daily lymphatic pumping there ex, 1 set of 10 each element, in order, bilaterally Daily simple self-MLD  Lymphedema Self- Care Instructions   1. EXERCISE: Perform lymphatic pumping there ex at least 2 x a day while wearing your compression wraps or garments. Perform 10 reps of each exercise bilaterally and be sure to perform them in order. Don't skip around!  OMIT PARTIAL SIT UPs.  2. MLD: Perform simple self-manual lymphatic drainage (MLD) at least once a day as directed. Take your time! Breathe! ;-)  3. If you have a Flexitouch advanced "pump" use it 1 time each day on a single limb only. The Flexitouch moves lymphatic fluid out of your affected body part and back to your heart, so DO NOT use the Flexi on 2 legs at a time, and DO NOT cues it on 2 legs on the same day. If you experience any atypical shortness of breath, sudden onset of pain, or feelings of heart arhythmia, or racing, discontinue use of the Flexitouch and report these symptoms to your doctor right away. Also, discontinue Flexi if you have an infection or a fever. It's OK to resume using the device 72 hours AFTER your first dose of oral antibiotic.   4. 4. WRAPS: Compression wraps are to be worn 23 hrs/ 7 days/wk during Intensive Phase of Complete Decongestive Therapy (CDT).Building tolerance may take time and practice, so don't get discouraged. If bandages begin to feel tight during periods of inactivity and/or during the night, try performing your exercises to loosen them. Do not leave short stretch wraps in place for > 23 hours. It is very important that you remove all wraps daily to inspect skin, bathe and perform skin care  before reapplying your wraps.  5. Daytime GARMENTS/ HOS DEVICES: During the Self-Management Phase CDT your compression garments are to be worn during waking hours when active. Do NOT sleep in your garments!!  Don daytime garments first thing in the morning. Do not wear your HOS devices all day instead of garments. These will not contain your swelling.  6. PUT YOUR FEET UP! Elevate your feet and legs and feet to the level of your heart whenever you are sitting down.   7. SKIN: Carefully monitor skin condition and perform impeccable hygiene daily. Bathe skin with mild soap and  water and apply low pH lotion (aka Eucerin ) to improve hydration and limit infection risk.   ASSESSMENT: CLINICAL IMPRESSION:  Pt agrees with plan to try CircAid compression alternative as a solution for difficulty applying compression wraps herself between sessions. Pt has not had consistent assistance from family and goes for days beyond recommendations either wrapped too long or not enough. Adjustable , Velcro style closure, knee length CircAids with custom foot pieces may give Pt greater independence while increasing compliance with recommendations and not over or under wrapping. Pt agrees with plan to complete measurements for custom CircAids next visit. Reapplied compression wraps to LLE as established. Cont as per POC.  OBJECTIVE IMPAIRMENTS: Abnormal gait, decreased activity tolerance, decreased balance, decreased endurance, decreased knowledge of condition, decreased knowledge of use of DME, decreased mobility, difficulty walking, decreased ROM, decreased strength, dizziness, hypomobility, increased edema, increased fascial restrictions, impaired flexibility, impaired sensation, postural dysfunction, obesity, pain, and chronic, progressive swelling .   ACTIVITY LIMITATIONS: carrying, lifting, bending, standing, squatting, stairs, transfers, bed mobility, continence, bathing, toileting, dressing, hygiene/grooming, and  caring for others  PARTICIPATION LIMITATIONS: meal prep, cleaning, laundry, driving, shopping, community activity, occupation, yard work, school, church, and caring for others  PERSONAL FACTORS: 3+ contributing Co morbidities, limited transportation, consistent, ongoing availability of caregivers, financial limitations are also affecting patient's functional outcome.   REHAB POTENTIAL: Lymphedema management has a high burden of care, especially for Patients who have difficulty, or who are unable to reach their distal legs and feet to apply bandages, compression garments, to bathe, inspect skin and groom nails. In this case because Pt is unable to perform these activities, daily caregiver assistance with all lower extremity lymphedema self-care home program components is essential for achieving clinical success and ongoing self-management. Without consistent, ongoing, daily caregiver assistance for compression bandaging and garments, skin care, and MLD Pt's prognosis is poor. With consistent caregiver assistance with lymphedema home program Pt's prognosis for improved lymphedema control and reducing infection risk is guarded, at best, due to the advanced stage, multiple contributing co-morbidities, and sedentary lifestyle.  EVALUATION COMPLEXITY: High   GOALS: Goals reviewed with patient? YES  SHORT TERM GOALS: Target date: 6th OT visit   Pt will demonstrate understanding of lymphedema precautions and prevention strategies with modified independence using a printed reference to identify at least 5 precautions and discussing how s/he may implement them into daily life to reduce risk of progression with modified assistance. Baseline: Max A Goal status:05/08/22 ONGOING  2.   With Max caregiver assistance Pt will be able to apply multilayer, knee length, compression wraps using gradient techniques to decrease limb volume, to limit infection risk, and to limit lymphedema progression.  Baseline:  Dependent Goal status: 05/08/22 GOAL MET  LONG TERM GOALS: Target date: 05/30/22  Given this patient's Intake score of 44/100% on the functional outcomes FOTO tool, patient will experience an increase in function of 3  points to improve basic and instrumental ADLs performance, including lymphedema self-care. Baseline: 44/100 Goal status: 05/08/22 ONGOING  2.  Given this patient's Intake score of TBA % on the Lymphedema Life Impact Scale (LLIS), patient will experience a reduction of at least 5% in her perceived level of functional impairment resulting from lymphedema to improve functional performance and quality of life (QOL). Baseline: TBA Goal status: 05/08/22 ONGOING  3.  With max caregiver assistance Pt will be able to don and doff appropriate compression garments and/or devices to control BLE lymphedema and to limit progression.  Baseline: Dependent Goal  status: 05/08/22 ONGOING. Limb volume not reduced enough to date to consider garment measurement and fitting.  4.  With tolerance building strategies over time  Pt will be able to tolerate compression bandages and garments for at least 24 hours  to control BLE lymphedema and to limit progression.  Baseline: Max A Goal status: 05/08/22 GOAL MET for Bandages. ONGOING for garments.  5.  With Max caregiver assistance Pt will achieve and sustain no less than 85% compliance with all LE self-care home program components throughout CDT, including modified simple self-MLD, daily skin care and inspection, lymphatic pumping the ex and appropriate compression to limit lymphedema progression and to limit further functional decline. Baseline: Dependent Goal status: 05/08/22 ONGOING  6.  Pt will diligently perform skin care regime prescribed by her doctor to eliminate fungal infection and prevent reoccurrence for 90 days Baseline: Dependent Goal status: 05/08/22 ONGOING   OT PLAN OF CARE:  PT FREQUENCY: 2x/week  PT DURATION: other: 12 weeks and PRN  .  05/08/22 Due to the severity of this case and double limb involvement it is expected that OT for CDT will quite a bit longer THAT INITIAL 12 WEEK POC  PLANNED INTERVENTIONS: Therapeutic exercises, Therapeutic activity, Patient/Family education, Self Care, DME instructions, Manual lymph drainage, Compression bandaging, Vasopneumatic device, Manual therapy, and skin care  PLAN FOR NEXT SESSION:  Skin care MLD Multilayer wraps Pt/ family edu Loel Dubonnet, MS, OTR/L, CLT-LANA 07/25/22 3:01 PM

## 2022-07-26 ENCOUNTER — Encounter: Payer: Medicare HMO | Admitting: Occupational Therapy

## 2022-07-27 ENCOUNTER — Telehealth: Payer: Self-pay

## 2022-07-27 ENCOUNTER — Ambulatory Visit: Payer: Medicare HMO | Admitting: Occupational Therapy

## 2022-07-27 ENCOUNTER — Encounter: Payer: Medicare HMO | Admitting: Occupational Therapy

## 2022-07-27 ENCOUNTER — Other Ambulatory Visit: Payer: Self-pay | Admitting: Nurse Practitioner

## 2022-07-27 DIAGNOSIS — I890 Lymphedema, not elsewhere classified: Secondary | ICD-10-CM | POA: Diagnosis not present

## 2022-07-27 NOTE — Therapy (Deleted)
OUTPATIENT OCCUPATIONAL THERAPY TREATMENT NOTE  BILATERAL  LOWER EXTREMITY LYMPHEDEMA  Patient Name: [name] MRN: [id number] DOB:07-17-1965, 57 y.o., female Today's Date: 07/27/2022   END OF SESSION:  OT End of Session - 07/25/22 1312     Visit Number 17    Number of Visits 36    Date for OT Re-Evaluation 09/13/22    OT Start Time 0105   OT Stop Time 0210    OT Time Calculation (min) 65 min    Activity Tolerance Patient tolerated treatment well;No increased pain    Behavior During Therapy WFL for tasks assessed/performed              Past Medical History:  Diagnosis Date   Anemia    Anxiety    Arthritis    Asthma    hx as child - no prob as adult - no inhaler   Blood transfusion 01/22/11   transfusion 2 units at [facility]   Depression 08/2010   psych assessment   Dyspnea    Fibroid    Headache(784.0)    rx for imitrex - last one jan   Keloid    Lymphedema    Pulmonary embolism (HCC)    Past Surgical History:  Procedure Laterality Date   ABDOMINAL HYSTERECTOMY     COLONOSCOPY N/A 11/08/2017   Procedure: COLONOSCOPY;  Surgeon: West Bali, MD;  [facility];  Service: Endoscopy;  Laterality: N/A;  2:00pm   FLEXIBLE SIGMOIDOSCOPY N/A 09/17/2017   Procedure: FLEXIBLE SIGMOIDOSCOPY;  Surgeon: West Bali, MD;  Location: [facility];  Service: Endoscopy;  Laterality: N/A;   HERNIA REPAIR  2009   umbilical   POLYPECTOMY  11/08/2017   Procedure: POLYPECTOMY;  Surgeon: West Bali, MD;  Location: [facility];  Service: Endoscopy;;  ascending colon (CSx1), transverse colon (CS x1), splenic flexure (HSx1)   SVD     Spontaneous vaginal delivery; x 1   Patient Active Problem List   Diagnosis Date Noted   Rheumatoid arthritis (HCC) 03/06/2022   Lymphedema 03/06/2022   Morbid obesity (HCC) 09/14/2021   Chest pain 04/07/2021   Screening for malignant neoplasm of colon 03/09/2021   Constipation 03/09/2021   Iron deficiency anemia  03/09/2021   Body mass index (BMI) 45.0-49.9, adult (HCC) 08/20/2020   Chronic fatigue syndrome 08/20/2020   Polyarthralgia 08/20/2020   Vitamin D deficiency 08/20/2020   Cellulitis 12/31/2018   Elephantiasis 12/31/2018   Ischemic chest pain (HCC) 12/31/2018   History of pulmonary embolus (PE) 12/31/2018   Physical deconditioning 08/27/2018   Adenopathy 09/15/2017   Family history of colon cancer 08/01/2017   Taking multiple medications for chronic disease 08/01/2017   Axillary lymphadenopathy 01/26/2017   Urinary incontinence 01/25/2017   Hypokalemia 01/25/2017   Lobar pneumonia (HCC) 01/25/2017   Pressure ulcer of sacral region, stage 2 (HCC) 01/25/2017   Abnormal ECG 01/29/2013   Dyspnea    Menorrhagia with regular cycle 02/22/2011   Anemia 02/22/2011    PCP: Loreen Freud, NP  REFERRING PROVIDER: Geralynn Rile, DPM  REFERRING DIAG: I89.0  THERAPY DIAG:  Lymphedema, not elsewhere classified  ONSET DATE: 2018  SUBJECTIVE:  SUBJECTIVE STATEMENT:  [name] presents to Occupational Therapy for lymphedema care to BLE , stage 3, elephantiasis. Pt is on time for 1 hr  Rx session.  Pt reports she ldid not bring all needed compression wraps to clinic as they are still in the drier. OT will re-use some wraps that are not soiled.  Pt reports she continues to work on transportation coverage for upcoming visits.   PERTINENT HISTORY:  Severe, stage IV, BLE Lymphedema (Elephantiasis [place]). ENV is a complication of sever, chronic, progressive Lymphedema (with hyperkeratosis, hyperplasia, hyperpigmentation, papillomatosis, skin breakdown with lymphorrhea, elephantiasis) HTN, blood clot disorder with PE x 2 in 2018, RA, Hx recurrent cellulitis, severe morbid obesity (body mass index >  40.4 kg/m2).RA, deconditioning, hx pressure ulcer sacrum, hx non-pressure leg wound, hx depression hx chronic fatigue, hx keloids PAIN:  Are you having pain? : yes- LE related not rated /10 Pain location: BLE Pain description: Lupus. Lymphedema: heaviness, tightness, fullness, sore Aggravating: weight bearing, functional upper extremity use against gravity Relieving factors: rest, elevation   PRECAUTIONS: Fall, recurrent infections, hx recurrent non-pressure leg wound;    IMPAIRMENTS AND FUNCTIONAL LIMITATIONS: difficulty walking, chronic, progressive limb swelling with associated pain and disfigurement, impaired balance, generalized debility, decreased BLE AROM, decreased strength, impaired endurance, thickened skin limits flexibility  impaired basic and instrumental ADLs ( bathing, dressing, grooming (skin inspection, skin and nail care), impaired functional ambulation, transfers and mobility, impaired home management, shopping, unable to cook and prep food, unable to drive, dependent for wbc propulsion, unable to work, impaired social participation, impaired body image  PRIOR LEVEL OF FUNCTION: Requires assistive devices and varying levels of assistance with basic and instrumental ADLs, Needs assistance with functional ambulation, all transfers and functional mobility. Unable to work. Housebound due to impaired mobility and relies on others for transportation  PATIENT GOALS: Initial evaluation goal: keep swelling from getting worse so I don't lose my leg. Re-eval gal: 06/14/22: get my legs small enough to be able to walk without a walker.   OBJECTIVE:   OBSERVATIONS /ASSESSMENTS:     FOTO (functional outcomes measure): Intake score 03/01/22 44/100%  Lymphedema Life Impact Scale (LLIS) 61.76% (How much lymphedema related problems interfered with your life last week)  BLE COMPARATIVE LIMB VOLUMETRICS:   LANDMARK LEFT  03/14/22 INITIAL   R LEG (A-D) 14499.2 ml  R THIGH (E-G) 17195.4 ml   R FULL LIMB (A-G) 31694.6 ml  Limb Volume differential (LVD)  %  Volume change since initial %  Volume change overall %  (Blank rows = not tested)   LANDMARK LEFT  05/09/22 10 Rx Visit  L LEG (A-D) 15607.0 ml  L THIGH (E-G)  21709.3 ml  L FULL LIMB (A-G) 37316.4 ml  Limb Volume differential (LVD)  %  Volume change since initial R Leg (A-D) Increased in volume by 7.6% L thigh (E-G) INC by 26.25% L Full limb (E-G) is INC by 17.7%.   Volume change overall %        TODAY'S TREATMENT:  LLE/LLQ MLD utilizing short neck sequence, deep abdominal lymphatics and functional inguinal LN. Skin care with low ph castor oil simultaneously during MLD before re-wrapping to improve skin excursion and limit infection risk Multilayer knee length compression wraps to L Leg as established.  PATIENT EDUCATION:  Pt edu for adjustable CircAid compression garment alternatives as possible solution for lack of compression between sessions. Continued Pt/ CG edu for lymphedema self care home program throughout session. Topics include outcome of comparative limb volumetrics- starting limb volume differentials (LVDs), technology and gradient techniques used for short stretch, multilayer compression wrapping, simple self-MLD, therapeutic lymphatic pumping exercises, skin/nail care, LE precautions,. compression garment recommendations and specifications, wear and care schedule and compression garment donning / doffing w assistive devices. Discussed progress towards all OT goals since commencing CDT. All questions answered to the Pt's satisfaction.  Person educated: Patient and family member Education method: Explanation, Demonstration, Verbal and Tactile cues, and Handouts Education comprehension: verbalized understanding and returned demonstration  HOME EXERCISE PROGRAM: Daily ,  multilayer compression bandages to one limb at a time only during Intensive Phase CDT. Daily day and HOS  compression garments during Self-management Phase. Daily skin care w low ph lotion Daily lymphatic pumping there ex, 1 set of 10 each element, in order, bilaterally Daily simple self-MLD  Lymphedema Self- Care Instructions   1. EXERCISE: Perform lymphatic pumping there ex at least 2 x a day while wearing your compression wraps or garments. Perform 10 reps of each exercise bilaterally and be sure to perform them in order. Don't skip around!  OMIT PARTIAL SIT UPs.  2. MLD: Perform simple self-manual lymphatic drainage (MLD) at least once a day as directed. Take your time! Breathe! ;-)  3. If you have a Flexitouch advanced "pump" use it 1 time each day on a single limb only. The Flexitouch moves lymphatic fluid out of your affected body part and back to your heart, so DO NOT use the Flexi on 2 legs at a time, and DO NOT cues it on 2 legs on the same day. If you experience any atypical shortness of breath, sudden onset of pain, or feelings of heart arhythmia, or racing, discontinue use of the Flexitouch and report these symptoms to your doctor right away. Also, discontinue Flexi if you have an infection or a fever. It's OK to resume using the device 72 hours AFTER your first dose of oral antibiotic.   4. 4. WRAPS: Compression wraps are to be worn 23 hrs/ 7 days/wk during Intensive Phase of Complete Decongestive Therapy (CDT).Building tolerance may take time and practice, so don't get discouraged. If bandages begin to feel tight during periods of inactivity and/or during the night, try performing your exercises to loosen them. Do not leave short stretch wraps in place for > 23 hours. It is very important that you remove all wraps daily to inspect skin, bathe and perform skin care before reapplying your wraps.  5. Daytime GARMENTS/ HOS DEVICES: During the Self-Management Phase CDT your compression  garments are to be worn during waking hours when active. Do NOT sleep in your garments!!  Don daytime garments first thing in the morning. Do not wear your HOS devices all day instead of garments. These will not contain your swelling.  6. PUT YOUR FEET UP! Elevate your feet and legs and feet to the level of your heart whenever you are sitting down.   7. SKIN: Carefully monitor skin condition and perform impeccable hygiene daily. Bathe skin with mild soap and  water and apply low pH lotion (aka Eucerin ) to improve hydration and limit infection risk.   ASSESSMENT: CLINICAL IMPRESSION:  Pt agrees with plan to try CircAid compression alternative as a solution for difficulty applying compression wraps herself between sessions. Pt has not had consistent assistance from family and goes for days beyond recommendations either wrapped too long or not enough. Adjustable , Velcro style closure, knee length CircAids with custom foot pieces may give Pt greater independence while increasing compliance with recommendations and not over or under wrapping. Pt agrees with plan to complete measurements for custom CircAids next visit. Reapplied compression wraps to LLE as established. Cont as per POC.  OBJECTIVE IMPAIRMENTS: Abnormal gait, decreased activity tolerance, decreased balance, decreased endurance, decreased knowledge of condition, decreased knowledge of use of DME, decreased mobility, difficulty walking, decreased ROM, decreased strength, dizziness, hypomobility, increased edema, increased fascial restrictions, impaired flexibility, impaired sensation, postural dysfunction, obesity, pain, and chronic, progressive swelling .   ACTIVITY LIMITATIONS: carrying, lifting, bending, standing, squatting, stairs, transfers, bed mobility, continence, bathing, toileting, dressing, hygiene/grooming, and caring for others  PARTICIPATION LIMITATIONS: meal prep, cleaning, laundry, driving, shopping, community activity,  occupation, yard work, school, church, and caring for others  PERSONAL FACTORS: 3+ contributing Co morbidities, limited transportation, consistent, ongoing availability of caregivers, financial limitations are also affecting patient's functional outcome.   REHAB POTENTIAL: Lymphedema management has a high burden of care, especially for Patients who have difficulty, or who are unable to reach their distal legs and feet to apply bandages, compression garments, to bathe, inspect skin and groom nails. In this case because Pt is unable to perform these activities, daily caregiver assistance with all lower extremity lymphedema self-care home program components is essential for achieving clinical success and ongoing self-management. Without consistent, ongoing, daily caregiver assistance for compression bandaging and garments, skin care, and MLD Pt's prognosis is poor. With consistent caregiver assistance with lymphedema home program Pt's prognosis for improved lymphedema control and reducing infection risk is guarded, at best, due to the advanced stage, multiple contributing co-morbidities, and sedentary lifestyle.  EVALUATION COMPLEXITY: High   GOALS: Goals reviewed with patient? YES  SHORT TERM GOALS: Target date: 6th OT visit   Pt will demonstrate understanding of lymphedema precautions and prevention strategies with modified independence using a printed reference to identify at least 5 precautions and discussing how s/he may implement them into daily life to reduce risk of progression with modified assistance. Baseline: Max A Goal status:05/08/22 ONGOING  2.   With Max caregiver assistance Pt will be able to apply multilayer, knee length, compression wraps using gradient techniques to decrease limb volume, to limit infection risk, and to limit lymphedema progression.  Baseline: Dependent Goal status: 05/08/22 GOAL MET  LONG TERM GOALS: Target date: 05/30/22  Given this patient's Intake score of  44/100% on the functional outcomes FOTO tool, patient will experience an increase in function of 3  points to improve basic and instrumental ADLs performance, including lymphedema self-care. Baseline: 44/100 Goal status: 05/08/22 ONGOING  2.  Given this patient's Intake score of TBA % on the Lymphedema Life Impact Scale (LLIS), patient will experience a reduction of at least 5% in her perceived level of functional impairment resulting from lymphedema to improve functional performance and quality of life (QOL). Baseline: TBA Goal status: 05/08/22 ONGOING  3.  With max caregiver assistance Pt will be able to don and doff appropriate compression garments and/or devices to control BLE lymphedema and to limit progression.  Baseline: Dependent Goal  status: 05/08/22 ONGOING. Limb volume not reduced enough to date to consider garment measurement and fitting.  4.  With tolerance building strategies over time  Pt will be able to tolerate compression bandages and garments for at least 24 hours  to control BLE lymphedema and to limit progression.  Baseline: Max A Goal status: 05/08/22 GOAL MET for Bandages. ONGOING for garments.  5.  With Max caregiver assistance Pt will achieve and sustain no less than 85% compliance with all LE self-care home program components throughout CDT, including modified simple self-MLD, daily skin care and inspection, lymphatic pumping the ex and appropriate compression to limit lymphedema progression and to limit further functional decline. Baseline: Dependent Goal status: 05/08/22 ONGOING  6.  Pt will diligently perform skin care regime prescribed by her doctor to eliminate fungal infection and prevent reoccurrence for 90 days Baseline: Dependent Goal status: 05/08/22 ONGOING   OT PLAN OF CARE:  PT FREQUENCY: 2x/week  PT DURATION: other: 12 weeks and PRN  . 05/08/22 Due to the severity of this case and double limb involvement it is expected that OT for CDT will quite a bit  longer THAT INITIAL 12 WEEK POC  PLANNED INTERVENTIONS: Therapeutic exercises, Therapeutic activity, Patient/Family education, Self Care, DME instructions, Manual lymph drainage, Compression bandaging, Vasopneumatic device, Manual therapy, and skin care  PLAN FOR NEXT SESSION:  Skin care MLD Multilayer wraps Pt/ family edu Loel Dubonnet, MS, OTR/L, CLT-LANA 07/27/22 1:02 PM

## 2022-07-27 NOTE — Patient Outreach (Signed)
  Care Coordination    07/27/2022 Name: [name] MRN: [id number] DOB: [birth year]  [name] is a 57 y.o. year old female who sees Lauren Rigg, NP for primary care. I  collaborated with patients primary care providers office regarding transportation barriers to the Lymphedema clinic in [place]. I reached out to [name], Solicitor with [employer] [place]) requesting an update on the status of previously submitted application.    What matters to the patients health and wellness today?  SW did not speak with patient today.    SDOH assessments and interventions completed:  No     Care Coordination Interventions:  Yes, provided   Interventions Today    Flowsheet Row Most Recent Value  Chronic Disease   Chronic disease during today's visit Other  General Interventions   General Interventions Discussed/Reviewed Communication with  [name] with [facility]]  Communication with PCP/Specialists        Follow up plan:  SW will update patients care team of transportation resource options once a response is received.    Encounter Outcome:  Pt. Visit Completed   [name], BSW, CDP Social Worker, Certified Dementia Practitioner [facility] Care Management  Care Coordination [phone]

## 2022-07-27 NOTE — Telephone Encounter (Signed)
[name], our referral coordinator was able to find a lymphedema clinic in [facility] :  [address] [place], [place], [place] [address].  # [phone].  I called there and spoke to [place] who confirmed that they have a lymphedema clinic and accept patient's insurance: [employer], HMO/PPO.  She said that they are scheduling in July and need a referral from PCP.  A referral can be faxed to: [phone].  If this clinic is an option for her, she would be able to take [facility] [place]

## 2022-07-28 ENCOUNTER — Ambulatory Visit: Payer: Self-pay

## 2022-07-28 NOTE — Patient Outreach (Signed)
  Care Coordination   Follow Up Visit Note   07/28/2022 Name: CRICKETT STOCKSDALE MRN: [id number] DOB: 03/17/1965  TRENIYA SURITA is a 57 y.o. year old female who sees Claiborne Rigg, NP for primary care. I spoke with  [name] by phone today.  What matters to the patients health and wellness today?  Patient needs assistance with transportation to the Lymphedema Clinic in [place].    Goals Addressed             This Visit's Progress    Care Coordination Activities   On track    Care Coordination Interventions: Collaboration with Germain Osgood with [facility] [employer] ([facility]) who has advised the patient is approved to access this transportation resources for medical transportation outside the [place] area Education provided to the patient about this resource advising to call a minimum of 24 hours in advance to arrange transportation Provided the patient with resource information and contact number [phone]) via MyChart Message Discussed the patient will contact [facility] to arrange transportation to her appointments next week at the Lymphedema clinic Collaboration with patients care team advising patient has been approved for this resource Encouraged the patient to contact SW as needed; SW will follow up over the next month as previously planned          SDOH assessments and interventions completed:  Yes  SDOH Interventions Today    Flowsheet Row Most Recent Value  SDOH Interventions   Transportation Interventions Other (Comment)  [Pt uses [facility] for transportation. She has been approved to access [employer] for out of county medical transportation needs]        Care Coordination Interventions:  Yes, provided   Interventions Today    Flowsheet Row Most Recent Value  Chronic Disease   Chronic disease during today's visit Other  General Interventions   General Interventions Discussed/Reviewed General  Interventions Reviewed, [employer] has been approved for [employer]]  Education Interventions   Education Provided Provided Education  Provided Engineer, [employer]        Follow up plan: Follow up call scheduled for 6/19    Encounter Outcome:  Pt. Visit Completed   Bevelyn Ngo, Kenard Gower, CDP Social Worker, Certified Dementia Practitioner [facility] Care Management  Care Coordination [phone]

## 2022-07-28 NOTE — Therapy (Signed)
OUTPATIENT OCCUPATIONAL THERAPY TREATMENT NOTE  BILATERAL  LOWER EXTREMITY LYMPHEDEMA  Patient Name: [name] MRN: [id number] DOB:Mar 08, 1965, 57 y.o., female Today's Date: 07/28/2022   END OF SESSION:   OT End of Session - 07/27/22 1300     Visit Number 18    Number of Visits 36    Date for OT Re-Evaluation 09/13/22    OT Start Time 0100    OT Stop Time 0200    OT Time Calculation (min) 57 min    Activity Tolerance Patient tolerated treatment well;No increased pain    Behavior During Therapy WFL for tasks assessed/performed              Past Medical History:  Diagnosis Date   Anemia    Anxiety    Arthritis    Asthma    hx as child - no prob as adult - no inhaler   Blood transfusion 01/22/11   transfusion 2 units at [facility]   Depression 08/2010   psych assessment   Dyspnea    Fibroid    Headache(784.0)    rx for imitrex - last one jan   Keloid    Lymphedema    Pulmonary embolism (HCC)    Past Surgical History:  Procedure Laterality Date   ABDOMINAL HYSTERECTOMY     COLONOSCOPY N/A 11/08/2017   Procedure: COLONOSCOPY;  Surgeon: West Bali, MD;  [facility];  Service: Endoscopy;  Laterality: N/A;  2:00pm   FLEXIBLE SIGMOIDOSCOPY N/A 09/17/2017   Procedure: FLEXIBLE SIGMOIDOSCOPY;  Surgeon: West Bali, MD;  Location: [facility];  Service: Endoscopy;  Laterality: N/A;   HERNIA REPAIR  2009   umbilical   POLYPECTOMY  11/08/2017   Procedure: POLYPECTOMY;  Surgeon: West Bali, MD;  [facility]: [facility];  Service: Endoscopy;;  ascending colon (CSx1), transverse colon (CS x1), splenic flexure (HSx1)   SVD     Spontaneous vaginal delivery; x 1   Patient Active Problem List   Diagnosis Date Noted   Rheumatoid arthritis (HCC) 03/06/2022   Lymphedema 03/06/2022   Morbid obesity (HCC) 09/14/2021   Chest pain 04/07/2021   Screening for malignant neoplasm of colon 03/09/2021   Constipation 03/09/2021   Iron deficiency anemia  03/09/2021   Body mass index (BMI) 45.0-49.9, adult (HCC) 08/20/2020   Chronic fatigue syndrome 08/20/2020   Polyarthralgia 08/20/2020   Vitamin D deficiency 08/20/2020   Cellulitis 12/31/2018   Elephantiasis 12/31/2018   Ischemic chest pain (HCC) 12/31/2018   History of pulmonary embolus (PE) 12/31/2018   Physical deconditioning 08/27/2018   Adenopathy 09/15/2017   Family history of colon cancer 08/01/2017   Taking multiple medications for chronic disease 08/01/2017   Axillary lymphadenopathy 01/26/2017   Urinary incontinence 01/25/2017   Hypokalemia 01/25/2017   Lobar pneumonia (HCC) 01/25/2017   Pressure ulcer of sacral region, stage 2 (HCC) 01/25/2017   Abnormal ECG 01/29/2013   Dyspnea    Menorrhagia with regular cycle 02/22/2011   Anemia 02/22/2011    PCP: Loreen Freud, NP  REFERRING PROVIDER: Geralynn Rile, DPM  REFERRING DIAG: I89.0  THERAPY DIAG:  Lymphedema, not elsewhere classified  ONSET DATE: 2018  SUBJECTIVE:  SUBJECTIVE STATEMENT:  [name] presents to Occupational Therapy for lymphedema care to BLE , stage 3, elephantiasis. Pt is on time for 1 hr  Rx session.  Pt reports she left clean compression wraps at home in the washing machine. She is wearing LLE multilayer wraps applied at last visit. Pt reports she continues to work on transportation coverage for upcoming visits. Lymphedema related pain is unchanged, but Pt does not rate it numerically today.  PERTINENT HISTORY:  Severe, stage IV, BLE Lymphedema (Elephantiasis [place]). ENV is a complication of sever, chronic, progressive Lymphedema (with hyperkeratosis, hyperplasia, hyperpigmentation, papillomatosis, skin breakdown with lymphorrhea, elephantiasis) HTN, blood clot disorder with PE x 2 in 2018, RA,  Hx recurrent cellulitis, severe morbid obesity (body mass index > 40.4 kg/m2).RA, deconditioning, hx pressure ulcer sacrum, hx non-pressure leg wound, hx depression hx chronic fatigue, hx keloids PAIN:  Are you having pain? : yes- LE related not rated /10 Pain location: BLE Pain description: Lupus. Lymphedema: heaviness, tightness, fullness, sore Aggravating: weight bearing, functional upper extremity use against gravity Relieving factors: rest, elevation   PRECAUTIONS: Fall, recurrent infections, hx recurrent non-pressure leg wound;    IMPAIRMENTS AND FUNCTIONAL LIMITATIONS: difficulty walking, chronic, progressive limb swelling with associated pain and disfigurement, impaired balance, generalized debility, decreased BLE AROM, decreased strength, impaired endurance, thickened skin limits flexibility  impaired basic and instrumental ADLs ( bathing, dressing, grooming (skin inspection, skin and nail care), impaired functional ambulation, transfers and mobility, impaired home management, shopping, unable to cook and prep food, unable to drive, dependent for wbc propulsion, unable to work, impaired social participation, impaired body image  PRIOR LEVEL OF FUNCTION: Requires assistive devices and varying levels of assistance with basic and instrumental ADLs, Needs assistance with functional ambulation, all transfers and functional mobility. Unable to work. Housebound due to impaired mobility and relies on others for transportation  PATIENT GOALS: Initial evaluation goal: keep swelling from getting worse so I don't lose my leg. Re-eval gal: 06/14/22: get my legs small enough to be able to walk without a walker.   OBJECTIVE:   OBSERVATIONS /ASSESSMENTS:     FOTO (functional outcomes measure): Intake score 03/01/22 44/100%  Lymphedema Life Impact Scale (LLIS) 61.76% (How much lymphedema related problems interfered with your life last week)  BLE COMPARATIVE LIMB VOLUMETRICS: 03/14/22 INITIAL    LANDMARK LEFT   R LEG (A-D) 14499.2 ml  R THIGH (E-G) 17195.4 ml  R FULL LIMB (A-G) 31694.6 ml  Limb Volume differential (LVD)  %  Volume change since initial %  Volume change overall %  (Blank rows = not tested)   LLE COMPARATIVE LIMB VOLUMETRICS: 05/09/22 10th visit  LANDMARK LEFT   L LEG (A-D) 15607.0 ml  L THIGH (E-G)  21709.3 ml  L FULL LIMB (A-G) 37316.4 ml  Limb Volume differential (LVD)  %  Volume change since initial R Leg (A-D) Increased in volume by 7.6% L thigh (E-G) INC by 26.25% L Full limb (E-G) is INC by 17.7%.   Volume change overall %   LLE COMPARATIVE LIMB VOLUMETRICS: 20th visit  LANDMARK LEFT    L LEG (A-D)  ml  L THIGH (E-G)  ml  L FULL LIMB (A-G) ml  Limb Volume differential (LVD)  %  Volume change since initial    Volume change overall %   TODAY'S TREATMENT:  Anatomical measurements  Multilayer knee length compression wraps to L Leg as established.  PATIENT EDUCATION:  Continued Pt edu for adjustable CircAid compression garment alternatives as possible solution for lack of compression between sessions. Continued Pt/ CG edu for lymphedema self care home program throughout session. Topics include outcome of comparative limb volumetrics- starting limb volume differentials (LVDs), technology and gradient techniques used for short stretch, multilayer compression wrapping, simple self-MLD, therapeutic lymphatic pumping exercises, skin/nail care, LE precautions,. compression garment recommendations and specifications, wear and care schedule and compression garment donning / doffing w assistive devices. Discussed progress towards all OT goals since commencing CDT. All questions answered to the Pt's satisfaction.  Person educated: Patient and family member Education method: Explanation, Demonstration, Verbal and Tactile cues,  and Handouts Education comprehension: verbalized understanding and returned demonstration  HOME EXERCISE PROGRAM: Daily , multilayer compression bandages to one limb at a time only during Intensive Phase CDT. Daily day and HOS  compression garments during Self-management Phase. Daily skin care w low ph lotion Daily lymphatic pumping there ex, 1 set of 10 each element, in order, bilaterally Daily simple self-MLD  Lymphedema Self- Care Instructions   1. EXERCISE: Perform lymphatic pumping there ex at least 2 x a day while wearing your compression wraps or garments. Perform 10 reps of each exercise bilaterally and be sure to perform them in order. Don't skip around!  OMIT PARTIAL SIT UPs.  2. MLD: Perform simple self-manual lymphatic drainage (MLD) at least once a day as directed. Take your time! Breathe! ;-)  3. If you have a Flexitouch advanced "pump" use it 1 time each day on a single limb only. The Flexitouch moves lymphatic fluid out of your affected body part and back to your heart, so DO NOT use the Flexi on 2 legs at a time, and DO NOT cues it on 2 legs on the same day. If you experience any atypical shortness of breath, sudden onset of pain, or feelings of heart arhythmia, or racing, discontinue use of the Flexitouch and report these symptoms to your doctor right away. Also, discontinue Flexi if you have an infection or a fever. It's OK to resume using the device 72 hours AFTER your first dose of oral antibiotic.   4. 4. WRAPS: Compression wraps are to be worn 23 hrs/ 7 days/wk during Intensive Phase of Complete Decongestive Therapy (CDT).Building tolerance may take time and practice, so don't get discouraged. If bandages begin to feel tight during periods of inactivity and/or during the night, try performing your exercises to loosen them. Do not leave short stretch wraps in place for > 23 hours. It is very important that you remove all wraps daily to inspect skin, bathe and perform skin care  before reapplying your wraps.  5. Daytime GARMENTS/ HOS DEVICES: During the Self-Management Phase CDT your compression garments are to be worn during waking hours when active. Do NOT sleep in your garments!!  Don daytime garments first thing in the morning. Do not wear your HOS devices all day instead of garments. These will not contain your swelling.  6. PUT YOUR FEET UP! Elevate your feet and legs and feet to the level of your heart whenever you are sitting down.   7. SKIN: Carefully monitor skin condition and perform impeccable hygiene daily. Bathe skin with mild soap and water and apply low pH lotion (aka Eucerin ) to improve hydration and limit infection risk.   ASSESSMENT: CLINICAL IMPRESSION:  Pt agrees with plan to try CircAid  compression alternative as a solution for difficulty applying compression wraps herself between sessions. Pt has not had consistent assistance from family and goes for days beyond recommendations either wrapped too long or not enough. Adjustable , Velcro style closure, knee length CircAids with custom foot pieces may give Pt greater independence while increasing compliance with recommendations and not over or under wrapping. Completed measurements for custom LLE CircAids today Reapplied compression wraps to LLE as established. Cont as per POC.  OBJECTIVE IMPAIRMENTS: Abnormal gait, decreased activity tolerance, decreased balance, decreased endurance, decreased knowledge of condition, decreased knowledge of use of DME, decreased mobility, difficulty walking, decreased ROM, decreased strength, dizziness, hypomobility, increased edema, increased fascial restrictions, impaired flexibility, impaired sensation, postural dysfunction, obesity, pain, and chronic, progressive swelling .   ACTIVITY LIMITATIONS: carrying, lifting, bending, standing, squatting, stairs, transfers, bed mobility, continence, bathing, toileting, dressing, hygiene/grooming, and caring for  others  PARTICIPATION LIMITATIONS: meal prep, cleaning, laundry, driving, shopping, community activity, occupation, yard work, school, church, and caring for others  PERSONAL FACTORS: 3+ contributing Co morbidities, limited transportation, consistent, ongoing availability of caregivers, financial limitations are also affecting patient's functional outcome.   REHAB POTENTIAL: Lymphedema management has a high burden of care, especially for Patients who have difficulty, or who are unable to reach their distal legs and feet to apply bandages, compression garments, to bathe, inspect skin and groom nails. In this case because Pt is unable to perform these activities, daily caregiver assistance with all lower extremity lymphedema self-care home program components is essential for achieving clinical success and ongoing self-management. Without consistent, ongoing, daily caregiver assistance for compression bandaging and garments, skin care, and MLD Pt's prognosis is poor. With consistent caregiver assistance with lymphedema home program Pt's prognosis for improved lymphedema control and reducing infection risk is guarded, at best, due to the advanced stage, multiple contributing co-morbidities, and sedentary lifestyle.  EVALUATION COMPLEXITY: High   GOALS: Goals reviewed with patient? YES  SHORT TERM GOALS: Target date: 6th OT visit   Pt will demonstrate understanding of lymphedema precautions and prevention strategies with modified independence using a printed reference to identify at least 5 precautions and discussing how s/he may implement them into daily life to reduce risk of progression with modified assistance. Baseline: Max A Goal status:05/08/22 ONGOING  2.   With Max caregiver assistance Pt will be able to apply multilayer, knee length, compression wraps using gradient techniques to decrease limb volume, to limit infection risk, and to limit lymphedema progression.  Baseline: Dependent Goal  status: 05/08/22 GOAL MET  LONG TERM GOALS: Target date: 05/30/22  Given this patient's Intake score of 44/100% on the functional outcomes FOTO tool, patient will experience an increase in function of 3  points to improve basic and instrumental ADLs performance, including lymphedema self-care. Baseline: 44/100 Goal status: 05/08/22 ONGOING  2.  Given this patient's Intake score of TBA % on the Lymphedema Life Impact Scale (LLIS), patient will experience a reduction of at least 5% in her perceived level of functional impairment resulting from lymphedema to improve functional performance and quality of life (QOL). Baseline: TBA Goal status: 05/08/22 ONGOING  3.  With max caregiver assistance Pt will be able to don and doff appropriate compression garments and/or devices to control BLE lymphedema and to limit progression.  Baseline: Dependent Goal status: 05/08/22 ONGOING. Limb volume not reduced enough to date to consider garment measurement and fitting.  4.  With tolerance building strategies over time  Pt will be able to tolerate compression bandages  and garments for at least 24 hours  to control BLE lymphedema and to limit progression.  Baseline: Max A Goal status: 05/08/22 GOAL MET for Bandages. ONGOING for garments.  5.  With Max caregiver assistance Pt will achieve and sustain no less than 85% compliance with all LE self-care home program components throughout CDT, including modified simple self-MLD, daily skin care and inspection, lymphatic pumping the ex and appropriate compression to limit lymphedema progression and to limit further functional decline. Baseline: Dependent Goal status: 05/08/22 ONGOING  6.  Pt will diligently perform skin care regime prescribed by her doctor to eliminate fungal infection and prevent reoccurrence for 90 days Baseline: Dependent Goal status: 05/08/22 ONGOING   OT PLAN OF CARE:  PT FREQUENCY: 2x/week  PT DURATION: other: 12 weeks and PRN  . 05/08/22 Due to  the severity of this case and double limb involvement it is expected that OT for CDT will quite a bit longer THAT INITIAL 12 WEEK POC  PLANNED INTERVENTIONS: Therapeutic exercises, Therapeutic activity, Patient/Family education, Self Care, DME instructions, Manual lymph drainage, Compression bandaging, Vasopneumatic device, Manual therapy, and skin care  PLAN FOR NEXT SESSION:  Skin care MLD Multilayer wraps Pt/ family edu Loel Dubonnet, MS, OTR/L, CLT-LANA 07/28/22 11:31 AM

## 2022-07-28 NOTE — Patient Instructions (Signed)
Visit Information  Thank you for taking time to visit with me today. Please don't hesitate to contact me if I can be of assistance to you.   Following are the goals we discussed today:   - Call [facility] and [facility] Services at [phone] to arrange transportation to medical appointments that are out of the [place] service area   Our next appointment is by telephone on 6/19 at 1:00   Please call the care guide team at [phone] if you need to cancel or reschedule your appointment.   If you are experiencing a Mental Health or Behavioral Health Crisis or need someone to talk to, please go to [facility] [address] [place] [phone]) call 911  Patient verbalizes understanding of instructions and care plan provided today and agrees to view in MyChart. Active MyChart status and patient understanding of how to access instructions and care plan via MyChart confirmed with patient.     Bevelyn Ngo, BSW, CDP Social Worker, Certified Dementia Practitioner [facility] Care Management  Care Coordination [phone]

## 2022-07-31 ENCOUNTER — Ambulatory Visit: Payer: Medicare HMO | Admitting: Occupational Therapy

## 2022-07-31 NOTE — Telephone Encounter (Signed)
Per Cheryll Dessert, Referral Coordinator, a referral was sent to [facility]- [address], [place].   I called the patient and explained that we have been trying to find options for her lymphedema treatment.  She understands that she has been approved for [facility] but is not sure how many days/ week they would take her to [place].  She said that [facility], OT would like to see her three days/ week; but the patient does not know if that will be possible. Transportation to the appointments has been a barrier.  The patient said she had to cancel her OT treatment for today because she has a blister on her leg; but  plans to re-schedule for Wed, 08/02/2022.  I then explained to her that there is a [facility] in [place] that provides lymphedema treatment. A referral has been sent there and they may be contacting her,  I explained to her  that this may be another option for her for treatment if transportation continues to be a barrier.  She would be able to use [facility] [place] for transportation to [facility].  She also said she is moving in a couple of weeks to [place] , a [place], off [address].  I also explained that she would want to make sure that the [facility] clinic is able to provide care that she is satisfied with.  She said she understood.  I told her that if she has any questions, she can call me and if she would like to speak to someone at the [place] clinic, I can give her the phone number to call and discuss her questions.

## 2022-07-31 NOTE — Patient Outreach (Signed)
  Care Coordination    07/31/2022 Name: [name] MRN: [id number] DOB: [birth year]  [name] is a 57 y.o. year old female who sees Claiborne Rigg, NP for primary care. I  collaborated with [name] from patients primary care providers office regarding patients options for Lymphedema clinics and transportation needs. Discussed Erskine Squibb will outreach the patient to discuss options for care.  SDOH assessments and interventions completed:  No     Care Coordination Interventions:  Yes, provided   Interventions Today    Flowsheet Row Most Recent Value  Chronic Disease   Chronic disease during today's visit Other  General Interventions   General Interventions Discussed/Reviewed Communication with  Communication with PCP/Specialists        Follow up plan:  SW will continue to follow to assist with care coordination needs.    Encounter Outcome:  Pt. Visit Completed   Bevelyn Ngo, BSW, CDP Social Worker, Certified Dementia Practitioner [facility] Care Management  Care Coordination [phone]

## 2022-08-01 ENCOUNTER — Ambulatory Visit: Payer: Self-pay

## 2022-08-01 ENCOUNTER — Encounter: Payer: Medicare HMO | Admitting: Occupational Therapy

## 2022-08-01 DIAGNOSIS — M0579 Rheumatoid arthritis with rheumatoid factor of multiple sites without organ or systems involvement: Secondary | ICD-10-CM | POA: Diagnosis not present

## 2022-08-01 NOTE — Patient Instructions (Addendum)
Visit Information  Thank you for taking time to visit with me today. Please don't hesitate to contact me if I can be of assistance to you.   Following are the goals we discussed today:   Goals Addressed             This Visit's Progress    To better manage lymphedema to bilateral legs/feet       Care Coordination Interventions: Evaluation of current treatment plan related to lymphedema to bilateral legs  and patient's adherence to plan as established by provider Advised patient of PCP referral to [facility] outpatient clinic for lymphedema management at the [facility] location  Placed joint call with patient to schedule initial visit, after which patient will transition from [facility] to [facility] for ongoing lymphedema management  Determined patient will have her initial visit at [facility] on 09/06/22 @ 1:30 PM, patient understands to arrive 5-10 minutes early for her appointment  Determined patient will notify her current lymphedema clinic at [facility] of her last day to be completed on 08/30/22 @1  PM Routed note to PCP provider Loreen Freud NP to advise of the plan to transition to [facility] on 09/06/22 at the [facility] location          Our next appointment is by telephone on 09/12/22 at 11:00 AM   Please call the care guide team at [phone] if you need to cancel or reschedule your appointment.   If you are experiencing a Mental Health or Behavioral Health Crisis or need someone to talk to, please call 1-800-273-TALK (toll free, 24 hour hotline)  Patient verbalizes understanding of instructions and care plan provided today and agrees to view in MyChart. Active MyChart status and patient understanding of how to access instructions and care plan via MyChart confirmed with patient.     Delsa Sale, RN, BSN, CCM Care Management Coordinator [facility] Care Management Direct Phone: [phone]

## 2022-08-01 NOTE — Patient Outreach (Signed)
  Care Coordination   Follow Up Visit Note   08/01/2022 Name: [name] MRN: [id number] DOB: [birth year]  [name] is a 57 y.o. year old female who sees Claiborne Rigg, NP for primary care. I spoke with  [name] by phone today.  What matters to the patients health and wellness today?  Patient would like to continue receiving her lymphedema management. She will transition to [facility] in [place].     Goals Addressed             This Visit's Progress    To better manage lymphedema to bilateral legs/feet       Care Coordination Interventions: Evaluation of current treatment plan related to lymphedema to bilateral legs  and patient's adherence to plan as established by provider Advised patient of PCP referral to [facility] outpatient clinic for lymphedema management at the [facility] location  Placed joint call with patient to schedule initial visit, after which patient will transition from [facility] to [facility] for ongoing lymphedema management  Determined patient will have her initial visit at [facility] on 09/06/22 @ 1:30 PM, patient understands to arrive 5-10 minutes early for her appointment  Determined patient will notify her current lymphedema clinic at [facility] of her last day to be completed on 08/30/22 @1  PM Routed note to PCP provider Loreen Freud NP to advise of the plan to transition to [facility] on 09/06/22 at the [facility] location      Interventions Today    Flowsheet Row Most Recent Value  Chronic Disease   Chronic disease during today's visit Other  [lymphedema]  General Interventions   General Interventions Discussed/Reviewed General Interventions Discussed, General Interventions Reviewed, Doctor Visits, Communication with  Doctor Visits Discussed/Reviewed Doctor Visits Reviewed, Doctor Visits Discussed, Specialist  Communication with PCP/Specialists  [[facility]]  Education Interventions   Education Provided Provided Education  [regarding  lympedema referral to [facility] for lymphedema management]          SDOH assessments and interventions completed:  No     Care Coordination Interventions:  Yes, provided   Follow up plan: Follow up call scheduled for 09/12/22 @11 :00 AM    Encounter Outcome:  Pt. Visit Completed

## 2022-08-02 ENCOUNTER — Encounter: Payer: Medicare HMO | Admitting: Occupational Therapy

## 2022-08-02 ENCOUNTER — Ambulatory Visit: Payer: Medicare HMO | Admitting: Occupational Therapy

## 2022-08-02 NOTE — Telephone Encounter (Signed)
Message received from [facility], RN/THN stating she assisted the patient scheduling an appointment with [facility] lymphedema clinic for 09/06/2022.

## 2022-08-03 ENCOUNTER — Encounter: Payer: Medicare HMO | Admitting: Occupational Therapy

## 2022-08-07 ENCOUNTER — Ambulatory Visit: Payer: Medicare HMO | Admitting: Occupational Therapy

## 2022-08-08 ENCOUNTER — Encounter: Payer: Medicare HMO | Admitting: Occupational Therapy

## 2022-08-09 ENCOUNTER — Ambulatory Visit: Payer: Medicare HMO | Attending: Internal Medicine | Admitting: Occupational Therapy

## 2022-08-09 ENCOUNTER — Encounter: Payer: Medicare HMO | Admitting: Occupational Therapy

## 2022-08-09 DIAGNOSIS — I890 Lymphedema, not elsewhere classified: Secondary | ICD-10-CM | POA: Insufficient documentation

## 2022-08-09 NOTE — Therapy (Signed)
OUTPATIENT OCCUPATIONAL THERAPY TREATMENT NOTE  BILATERAL  LOWER EXTREMITY LYMPHEDEMA  Patient Name: [name] MRN: [id number] DOB:Jan 27, 1966, 57 y.o., female Today's Date: 08/09/2022   END OF SESSION:   OT End of Session - 07/27/22 1300     Visit Number 18    Number of Visits 36    Date for OT Re-Evaluation 09/13/22    OT Start Time 0100    OT Stop Time 0200    OT Time Calculation (min) 57 min    Activity Tolerance Patient tolerated treatment well;No increased pain    Behavior During Therapy WFL for tasks assessed/performed              Past Medical History:  Diagnosis Date   Anemia    Anxiety    Arthritis    Asthma    hx as child - no prob as adult - no inhaler   Blood transfusion 01/22/11   transfusion 2 units at [facility]   Depression 08/2010   psych assessment   Dyspnea    Fibroid    Headache(784.0)    rx for imitrex - last one jan   Keloid    Lymphedema    Pulmonary embolism (HCC)    Past Surgical History:  Procedure Laterality Date   ABDOMINAL HYSTERECTOMY     COLONOSCOPY N/A 11/08/2017   Procedure: COLONOSCOPY;  Surgeon: West Bali, MD;  Location: [facility];  Service: Endoscopy;  Laterality: N/A;  2:00pm   FLEXIBLE SIGMOIDOSCOPY N/A 09/17/2017   Procedure: FLEXIBLE SIGMOIDOSCOPY;  Surgeon: West Bali, MD;  Location: [facility];  Service: Endoscopy;  Laterality: N/A;   HERNIA REPAIR  2009   umbilical   POLYPECTOMY  11/08/2017   Procedure: POLYPECTOMY;  Surgeon: West Bali, MD;  Location: [facility];  Service: Endoscopy;;  ascending colon (CSx1), transverse colon (CS x1), splenic flexure (HSx1)   SVD     Spontaneous vaginal delivery; x 1   Patient Active Problem List   Diagnosis Date Noted   Rheumatoid arthritis (HCC) 03/06/2022   Lymphedema 03/06/2022   Morbid obesity (HCC) 09/14/2021   Chest pain 04/07/2021   Screening for malignant neoplasm of colon 03/09/2021   Constipation 03/09/2021   Iron deficiency anemia  03/09/2021   Body mass index (BMI) 45.0-49.9, adult (HCC) 08/20/2020   Chronic fatigue syndrome 08/20/2020   Polyarthralgia 08/20/2020   Vitamin D deficiency 08/20/2020   Cellulitis 12/31/2018   Elephantiasis 12/31/2018   Ischemic chest pain (HCC) 12/31/2018   History of pulmonary embolus (PE) 12/31/2018   Physical deconditioning 08/27/2018   Adenopathy 09/15/2017   Family history of colon cancer 08/01/2017   Taking multiple medications for chronic disease 08/01/2017   Axillary lymphadenopathy 01/26/2017   Urinary incontinence 01/25/2017   Hypokalemia 01/25/2017   Lobar pneumonia (HCC) 01/25/2017   Pressure ulcer of sacral region, stage 2 (HCC) 01/25/2017   Abnormal ECG 01/29/2013   Dyspnea    Menorrhagia with regular cycle 02/22/2011   Anemia 02/22/2011    PCP: Loreen Freud, NP  REFERRING PROVIDER: Geralynn Rile, DPM  REFERRING DIAG: I89.0  THERAPY DIAG:  Lymphedema, not elsewhere classified  ONSET DATE: 2018  SUBJECTIVE:  SUBJECTIVE STATEMENT:  [name] presents to Occupational Therapy for lymphedema care to BLE , stage 3, elephantiasis. Pt is on time for 1 hr  Rx session.  Pt reports she left clean compression wraps at home in the washing machine. She is wearing LLE multilayer wraps applied at last visit. Pt reports she continues to work on transportation coverage for upcoming visits. Lymphedema related pain is unchanged, but Pt does not rate it numerically today.  PERTINENT HISTORY:  Severe, stage IV, BLE Lymphedema (Elephantiasis [place]). ENV is a complication of sever, chronic, progressive Lymphedema (with hyperkeratosis, hyperplasia, hyperpigmentation, papillomatosis, skin breakdown with lymphorrhea, elephantiasis) HTN, blood clot disorder with PE x 2 in 2018, RA,  Hx recurrent cellulitis, severe morbid obesity (body mass index > 40.4 kg/m2).RA, deconditioning, hx pressure ulcer sacrum, hx non-pressure leg wound, hx depression hx chronic fatigue, hx keloids PAIN:  Are you having pain? : yes- LE related not rated /10 Pain location: BLE Pain description: Lupus. Lymphedema: heaviness, tightness, fullness, sore Aggravating: weight bearing, functional upper extremity use against gravity Relieving factors: rest, elevation   PRECAUTIONS: Fall, recurrent infections, hx recurrent non-pressure leg wound;    IMPAIRMENTS AND FUNCTIONAL LIMITATIONS: difficulty walking, chronic, progressive limb swelling with associated pain and disfigurement, impaired balance, generalized debility, decreased BLE AROM, decreased strength, impaired endurance, thickened skin limits flexibility  impaired basic and instrumental ADLs ( bathing, dressing, grooming (skin inspection, skin and nail care), impaired functional ambulation, transfers and mobility, impaired home management, shopping, unable to cook and prep food, unable to drive, dependent for wbc propulsion, unable to work, impaired social participation, impaired body image  PRIOR LEVEL OF FUNCTION: Requires assistive devices and varying levels of assistance with basic and instrumental ADLs, Needs assistance with functional ambulation, all transfers and functional mobility. Unable to work. Housebound due to impaired mobility and relies on others for transportation  PATIENT GOALS: Initial evaluation goal: keep swelling from getting worse so I don't lose my leg. Re-eval gal: 06/14/22: get my legs small enough to be able to walk without a walker.   OBJECTIVE:   OBSERVATIONS /ASSESSMENTS:     FOTO (functional outcomes measure): Intake score 03/01/22 44/100%  Lymphedema Life Impact Scale (LLIS) 61.76% (How much lymphedema related problems interfered with your life last week)  BLE COMPARATIVE LIMB VOLUMETRICS: 03/14/22 INITIAL    LANDMARK LEFT   R LEG (A-D) 14499.2 ml  R THIGH (E-G) 17195.4 ml  R FULL LIMB (A-G) 31694.6 ml  Limb Volume differential (LVD)  %  Volume change since initial %  Volume change overall %  (Blank rows = not tested)   LLE COMPARATIVE LIMB VOLUMETRICS: 05/09/22 10th visit  LANDMARK LEFT   L LEG (A-D) 15607.0 ml  L THIGH (E-G)  21709.3 ml  L FULL LIMB (A-G) 37316.4 ml  Limb Volume differential (LVD)  %  Volume change since initial R Leg (A-D) Increased in volume by 7.6% L thigh (E-G) INC by 26.25% L Full limb (E-G) is INC by 17.7%.   Volume change overall %   LLE COMPARATIVE LIMB VOLUMETRICS: 20th visit  LANDMARK LEFT    L LEG (A-D)  ml  L THIGH (E-G)  ml  L FULL LIMB (A-G) ml  Limb Volume differential (LVD)  %  Volume change since initial    Volume change overall %   TODAY'S TREATMENT:  Anatomical measurements  Multilayer knee length compression wraps to L Leg as established.  PATIENT EDUCATION:  Continued Pt edu for adjustable CircAid compression garment alternatives as possible solution for lack of compression between sessions. Continued Pt/ CG edu for lymphedema self care home program throughout session. Topics include outcome of comparative limb volumetrics- starting limb volume differentials (LVDs), technology and gradient techniques used for short stretch, multilayer compression wrapping, simple self-MLD, therapeutic lymphatic pumping exercises, skin/nail care, LE precautions,. compression garment recommendations and specifications, wear and care schedule and compression garment donning / doffing w assistive devices. Discussed progress towards all OT goals since commencing CDT. All questions answered to the Pt's satisfaction.  Person educated: Patient and family member Education method: Explanation, Demonstration, Verbal and Tactile cues,  and Handouts Education comprehension: verbalized understanding and returned demonstration  HOME EXERCISE PROGRAM: Daily , multilayer compression bandages to one limb at a time only during Intensive Phase CDT. Daily day and HOS  compression garments during Self-management Phase. Daily skin care w low ph lotion Daily lymphatic pumping there ex, 1 set of 10 each element, in order, bilaterally Daily simple self-MLD  Lymphedema Self- Care Instructions   1. EXERCISE: Perform lymphatic pumping there ex at least 2 x a day while wearing your compression wraps or garments. Perform 10 reps of each exercise bilaterally and be sure to perform them in order. Don't skip around!  OMIT PARTIAL SIT UPs.  2. MLD: Perform simple self-manual lymphatic drainage (MLD) at least once a day as directed. Take your time! Breathe! ;-)  3. If you have a Flexitouch advanced "pump" use it 1 time each day on a single limb only. The Flexitouch moves lymphatic fluid out of your affected body part and back to your heart, so DO NOT use the Flexi on 2 legs at a time, and DO NOT cues it on 2 legs on the same day. If you experience any atypical shortness of breath, sudden onset of pain, or feelings of heart arhythmia, or racing, discontinue use of the Flexitouch and report these symptoms to your doctor right away. Also, discontinue Flexi if you have an infection or a fever. It's OK to resume using the device 72 hours AFTER your first dose of oral antibiotic.   4. 4. WRAPS: Compression wraps are to be worn 23 hrs/ 7 days/wk during Intensive Phase of Complete Decongestive Therapy (CDT).Building tolerance may take time and practice, so don't get discouraged. If bandages begin to feel tight during periods of inactivity and/or during the night, try performing your exercises to loosen them. Do not leave short stretch wraps in place for > 23 hours. It is very important that you remove all wraps daily to inspect skin, bathe and perform skin care  before reapplying your wraps.  5. Daytime GARMENTS/ HOS DEVICES: During the Self-Management Phase CDT your compression garments are to be worn during waking hours when active. Do NOT sleep in your garments!!  Don daytime garments first thing in the morning. Do not wear your HOS devices all day instead of garments. These will not contain your swelling.  6. PUT YOUR FEET UP! Elevate your feet and legs and feet to the level of your heart whenever you are sitting down.   7. SKIN: Carefully monitor skin condition and perform impeccable hygiene daily. Bathe skin with mild soap and water and apply low pH lotion (aka Eucerin ) to improve hydration and limit infection risk.   ASSESSMENT: CLINICAL IMPRESSION:  Pt agrees with plan to try CircAid  compression alternative as a solution for difficulty applying compression wraps herself between sessions. Pt has not had consistent assistance from family and goes for days beyond recommendations either wrapped too long or not enough. Adjustable , Velcro style closure, knee length CircAids with custom foot pieces may give Pt greater independence while increasing compliance with recommendations and not over or under wrapping. Completed measurements for custom LLE CircAids today Reapplied compression wraps to LLE as established. Cont as per POC.  OBJECTIVE IMPAIRMENTS: Abnormal gait, decreased activity tolerance, decreased balance, decreased endurance, decreased knowledge of condition, decreased knowledge of use of DME, decreased mobility, difficulty walking, decreased ROM, decreased strength, dizziness, hypomobility, increased edema, increased fascial restrictions, impaired flexibility, impaired sensation, postural dysfunction, obesity, pain, and chronic, progressive swelling .   ACTIVITY LIMITATIONS: carrying, lifting, bending, standing, squatting, stairs, transfers, bed mobility, continence, bathing, toileting, dressing, hygiene/grooming, and caring for  others  PARTICIPATION LIMITATIONS: meal prep, cleaning, laundry, driving, shopping, community activity, occupation, yard work, school, church, and caring for others  PERSONAL FACTORS: 3+ contributing Co morbidities, limited transportation, consistent, ongoing availability of caregivers, financial limitations are also affecting patient's functional outcome.   REHAB POTENTIAL: Lymphedema management has a high burden of care, especially for Patients who have difficulty, or who are unable to reach their distal legs and feet to apply bandages, compression garments, to bathe, inspect skin and groom nails. In this case because Pt is unable to perform these activities, daily caregiver assistance with all lower extremity lymphedema self-care home program components is essential for achieving clinical success and ongoing self-management. Without consistent, ongoing, daily caregiver assistance for compression bandaging and garments, skin care, and MLD Pt's prognosis is poor. With consistent caregiver assistance with lymphedema home program Pt's prognosis for improved lymphedema control and reducing infection risk is guarded, at best, due to the advanced stage, multiple contributing co-morbidities, and sedentary lifestyle.  EVALUATION COMPLEXITY: High   GOALS: Goals reviewed with patient? YES  SHORT TERM GOALS: Target date: 6th OT visit   Pt will demonstrate understanding of lymphedema precautions and prevention strategies with modified independence using a printed reference to identify at least 5 precautions and discussing how s/he may implement them into daily life to reduce risk of progression with modified assistance. Baseline: Max A Goal status:05/08/22 ONGOING  2.   With Max caregiver assistance Pt will be able to apply multilayer, knee length, compression wraps using gradient techniques to decrease limb volume, to limit infection risk, and to limit lymphedema progression.  Baseline: Dependent Goal  status: 05/08/22 GOAL MET  LONG TERM GOALS: Target date: 05/30/22  Given this patient's Intake score of 44/100% on the functional outcomes FOTO tool, patient will experience an increase in function of 3  points to improve basic and instrumental ADLs performance, including lymphedema self-care. Baseline: 44/100 Goal status: 05/08/22 ONGOING  2.  Given this patient's Intake score of TBA % on the Lymphedema Life Impact Scale (LLIS), patient will experience a reduction of at least 5% in her perceived level of functional impairment resulting from lymphedema to improve functional performance and quality of life (QOL). Baseline: TBA Goal status: 05/08/22 ONGOING  3.  With max caregiver assistance Pt will be able to don and doff appropriate compression garments and/or devices to control BLE lymphedema and to limit progression.  Baseline: Dependent Goal status: 05/08/22 ONGOING. Limb volume not reduced enough to date to consider garment measurement and fitting.  4.  With tolerance building strategies over time  Pt will be able to tolerate compression bandages  and garments for at least 24 hours  to control BLE lymphedema and to limit progression.  Baseline: Max A Goal status: 05/08/22 GOAL MET for Bandages. ONGOING for garments.  5.  With Max caregiver assistance Pt will achieve and sustain no less than 85% compliance with all LE self-care home program components throughout CDT, including modified simple self-MLD, daily skin care and inspection, lymphatic pumping the ex and appropriate compression to limit lymphedema progression and to limit further functional decline. Baseline: Dependent Goal status: 05/08/22 ONGOING  6.  Pt will diligently perform skin care regime prescribed by her doctor to eliminate fungal infection and prevent reoccurrence for 90 days Baseline: Dependent Goal status: 05/08/22 ONGOING   OT PLAN OF CARE:  PT FREQUENCY: 2x/week  PT DURATION: other: 12 weeks and PRN  . 05/08/22 Due to  the severity of this case and double limb involvement it is expected that OT for CDT will quite a bit longer THAT INITIAL 12 WEEK POC  PLANNED INTERVENTIONS: Therapeutic exercises, Therapeutic activity, Patient/Family education, Self Care, DME instructions, Manual lymph drainage, Compression bandaging, Vasopneumatic device, Manual therapy, and skin care  PLAN FOR NEXT SESSION:  Skin care MLD Multilayer wraps Pt/ family edu Loel Dubonnet, MS, OTR/L, CLT-LANA 08/09/22 1:05 PM

## 2022-08-09 NOTE — Therapy (Signed)
OUTPATIENT OCCUPATIONAL THERAPY TREATMENT NOTE  BILATERAL  LOWER EXTREMITY LYMPHEDEMA  Patient Name: [name] MRN: [id number] DOB:Aug 07, 1965, 57 y.o., female Today's Date: 08/09/2022   END OF SESSION:    OT End of Session - 08/09/22 1304     Visit Number 19    Number of Visits 36    Date for OT Re-Evaluation 09/13/22    OT Start Time 0104    OT Stop Time 0215    OT Time Calculation (min) 71 min    Activity Tolerance Patient tolerated treatment well;No increased pain    Behavior During Therapy WFL for tasks assessed/performed             Past Medical History:  Diagnosis Date   Anemia    Anxiety    Arthritis    Asthma    hx as child - no prob as adult - no inhaler   Blood transfusion 01/22/11   transfusion 2 units at [facility]   Depression 08/2010   psych assessment   Dyspnea    Fibroid    Headache(784.0)    rx for imitrex - last one jan   Keloid    Lymphedema    Pulmonary embolism (HCC)    Past Surgical History:  Procedure Laterality Date   ABDOMINAL HYSTERECTOMY     COLONOSCOPY N/A 11/08/2017   Procedure: COLONOSCOPY;  Surgeon: West Bali, MD;  [facility];  Service: Endoscopy;  Laterality: N/A;  2:00pm   FLEXIBLE SIGMOIDOSCOPY N/A 09/17/2017   Procedure: FLEXIBLE SIGMOIDOSCOPY;  Surgeon: West Bali, MD;  Location: [facility];  Service: Endoscopy;  Laterality: N/A;   HERNIA REPAIR  2009   umbilical   POLYPECTOMY  11/08/2017   Procedure: POLYPECTOMY;  Surgeon: West Bali, MD;  Location: [facility];  Service: Endoscopy;;  ascending colon (CSx1), transverse colon (CS x1), splenic flexure (HSx1)   SVD     Spontaneous vaginal delivery; x 1   Patient Active Problem List   Diagnosis Date Noted   Rheumatoid arthritis (HCC) 03/06/2022   Lymphedema 03/06/2022   Morbid obesity (HCC) 09/14/2021   Chest pain 04/07/2021   Screening for malignant neoplasm of colon 03/09/2021   Constipation 03/09/2021   Iron deficiency anemia  03/09/2021   Body mass index (BMI) 45.0-49.9, adult (HCC) 08/20/2020   Chronic fatigue syndrome 08/20/2020   Polyarthralgia 08/20/2020   Vitamin D deficiency 08/20/2020   Cellulitis 12/31/2018   Elephantiasis 12/31/2018   Ischemic chest pain (HCC) 12/31/2018   History of pulmonary embolus (PE) 12/31/2018   Physical deconditioning 08/27/2018   Adenopathy 09/15/2017   Family history of colon cancer 08/01/2017   Taking multiple medications for chronic disease 08/01/2017   Axillary lymphadenopathy 01/26/2017   Urinary incontinence 01/25/2017   Hypokalemia 01/25/2017   Lobar pneumonia (HCC) 01/25/2017   Pressure ulcer of sacral region, stage 2 (HCC) 01/25/2017   Abnormal ECG 01/29/2013   Dyspnea    Menorrhagia with regular cycle 02/22/2011   Anemia 02/22/2011    PCP: Loreen Freud, NP  REFERRING PROVIDER: Geralynn Rile, DPM  REFERRING DIAG: I89.0  THERAPY DIAG:  Lymphedema, not elsewhere classified  ONSET DATE: 2018  SUBJECTIVE:  SUBJECTIVE STATEMENT:  Fidelia Hromadka presents to Occupational Therapy for lymphedema care to BLE , stage 3, elephantiasis. Pt is on time for 1 hr  Rx session.  Pt reports she left clean compression wraps at home in the washing machine. She is wearing LLE multilayer wraps applied at last visit. Pt reports she has arranged transportation and it is working well for her.  Lymphedema related pain is unchanged, but Pt does not rate it numerically today.  PERTINENT HISTORY:  Severe, stage IV, BLE Lymphedema (Elephantiasis [place]). ENV is a complication of sever, chronic, progressive Lymphedema (with hyperkeratosis, hyperplasia, hyperpigmentation, papillomatosis, skin breakdown with lymphorrhea, elephantiasis) HTN, blood clot disorder with PE x 2 in 2018, RA, Hx  recurrent cellulitis, severe morbid obesity (body mass index > 40.4 kg/m2).RA, deconditioning, hx pressure ulcer sacrum, hx non-pressure leg wound, hx depression hx chronic fatigue, hx keloids PAIN:  Are you having pain? : yes- LE related not rated /10 Pain location: BLE Pain description: Lupus. Lymphedema: heaviness, tightness, fullness, sore Aggravating: weight bearing, functional upper extremity use against gravity Relieving factors: rest, elevation   PRECAUTIONS: Fall, recurrent infections, hx recurrent non-pressure leg wound;    IMPAIRMENTS AND FUNCTIONAL LIMITATIONS: difficulty walking, chronic, progressive limb swelling with associated pain and disfigurement, impaired balance, generalized debility, decreased BLE AROM, decreased strength, impaired endurance, thickened skin limits flexibility  impaired basic and instrumental ADLs ( bathing, dressing, grooming (skin inspection, skin and nail care), impaired functional ambulation, transfers and mobility, impaired home management, shopping, unable to cook and prep food, unable to drive, dependent for wbc propulsion, unable to work, impaired social participation, impaired body image  PRIOR LEVEL OF FUNCTION: Requires assistive devices and varying levels of assistance with basic and instrumental ADLs, Needs assistance with functional ambulation, all transfers and functional mobility. Unable to work. Housebound due to impaired mobility and relies on others for transportation  PATIENT GOALS: Initial evaluation goal: keep swelling from getting worse so I don't lose my leg. Re-eval gal: 06/14/22: get my legs small enough to be able to walk without a walker.   OBJECTIVE:   OBSERVATIONS /ASSESSMENTS:     FOTO (functional outcomes measure): Intake score 03/01/22 44/100%  Lymphedema Life Impact Scale (LLIS) 61.76% (How much lymphedema related problems interfered with your life last week)  BLE COMPARATIVE LIMB VOLUMETRICS: 03/14/22 INITIAL    LANDMARK LEFT   R LEG (A-D) 14499.2 ml  R THIGH (E-G) 17195.4 ml  R FULL LIMB (A-G) 31694.6 ml  Limb Volume differential (LVD)  %  Volume change since initial %  Volume change overall %  (Blank rows = not tested)   LLE COMPARATIVE LIMB VOLUMETRICS: 05/09/22 10 th visit  LANDMARK LEFT   L LEG (A-D) 15607.0 ml  L THIGH (E-G)  21709.3 ml  L FULL LIMB (A-G) 37316.4 ml  Limb Volume differential (LVD)  %  Volume change since initial R Leg (A-D) Increased in volume by 7.6% L thigh (E-G) INC by 26.25% L Full limb (E-G) is INC by 17.7%.   Volume change overall %   LLE COMPARATIVE LIMB VOLUMETRICS: 20 th visit  LANDMARK LEFT    L LEG (A-D)  ml  L THIGH (E-G)  ml  L FULL LIMB (A-G) ml  Limb Volume differential (LVD)  %  Volume change since initial    Volume change overall %   TODAY'S TREATMENT:  LLE MLD and simultaneous skin care Multilayer knee length compression wraps to L Leg as established.  PATIENT EDUCATION:  Continued Pt edu for adjustable CircAid compression garment alternatives as possible solution for lack of compression between sessions. Continued Pt/ CG edu for lymphedema self care home program throughout session. Topics include outcome of comparative limb volumetrics- starting limb volume differentials (LVDs), technology and gradient techniques used for short stretch, multilayer compression wrapping, simple self-MLD, therapeutic lymphatic pumping exercises, skin/nail care, LE precautions,. compression garment recommendations and specifications, wear and care schedule and compression garment donning / doffing w assistive devices. Discussed progress towards all OT goals since commencing CDT. All questions answered to the Pt's satisfaction.  Person educated: Patient and family member Education method: Explanation, Demonstration, Verbal and  Tactile cues, and Handouts Education comprehension: verbalized understanding and returned demonstration  HOME EXERCISE PROGRAM: Daily , multilayer compression bandages to one limb at a time only during Intensive Phase CDT. Daily day and HOS  compression garments during Self-management Phase. Daily skin care w low ph lotion Daily lymphatic pumping there ex, 1 set of 10 each element, in order, bilaterally Daily simple self-MLD  Lymphedema Self- Care Instructions   1. EXERCISE: Perform lymphatic pumping there ex at least 2 x a day while wearing your compression wraps or garments. Perform 10 reps of each exercise bilaterally and be sure to perform them in order. Don't skip around!  OMIT PARTIAL SIT UPs.  2. MLD: Perform simple self-manual lymphatic drainage (MLD) at least once a day as directed. Take your time! Breathe! ;-)  3. If you have a Flexitouch advanced "pump" use it 1 time each day on a single limb only. The Flexitouch moves lymphatic fluid out of your affected body part and back to your heart, so DO NOT use the Flexi on 2 legs at a time, and DO NOT cues it on 2 legs on the same day. If you experience any atypical shortness of breath, sudden onset of pain, or feelings of heart arhythmia, or racing, discontinue use of the Flexitouch and report these symptoms to your doctor right away. Also, discontinue Flexi if you have an infection or a fever. It's OK to resume using the device 72 hours AFTER your first dose of oral antibiotic.   4. 4. WRAPS: Compression wraps are to be worn 23 hrs/ 7 days/wk during Intensive Phase of Complete Decongestive Therapy (CDT).Building tolerance may take time and practice, so don't get discouraged. If bandages begin to feel tight during periods of inactivity and/or during the night, try performing your exercises to loosen them. Do not leave short stretch wraps in place for > 23 hours. It is very important that you remove all wraps daily to inspect skin, bathe and  perform skin care before reapplying your wraps.  5. Daytime GARMENTS/ HOS DEVICES: During the Self-Management Phase CDT your compression garments are to be worn during waking hours when active. Do NOT sleep in your garments!!  Don daytime garments first thing in the morning. Do not wear your HOS devices all day instead of garments. These will not contain your swelling.  6. PUT YOUR FEET UP! Elevate your feet and legs and feet to the level of your heart whenever you are sitting down.   7. SKIN: Carefully monitor skin condition and perform impeccable hygiene daily. Bathe skin with mild soap and water and apply low pH lotion (aka Eucerin ) to improve hydration and limit infection risk.   ASSESSMENT: CLINICAL IMPRESSION:  Continued MLD w simultaneous  skin care to LLE. Posterior leg   densely encrusted with severe papillomatosis. Skin is very thick and nearly immovable. Pt tends  to externally rotate hip with knee bent so leg is in contact with treatment surface, and she has difficulty bending knee and lifting leg  for therapist to access posterior leg. Front and medial aspect of L leg is more pliable and less indurated    with flatter plaques and obvious lymphatic cysts. Skin changes since commencing CDT are minimal. Pt tolerated LLE/LLQ MLD without increased pain today. Wraps applied as established. Cont as per POC. Volumetrics and progress assessment next session.  OBJECTIVE IMPAIRMENTS: Abnormal gait, decreased activity tolerance, decreased balance, decreased endurance, decreased knowledge of condition, decreased knowledge of use of DME, decreased mobility, difficulty walking, decreased ROM, decreased strength, dizziness, hypomobility, increased edema, increased fascial restrictions, impaired flexibility, impaired sensation, postural dysfunction, obesity, pain, and chronic, progressive swelling .   ACTIVITY LIMITATIONS: carrying, lifting, bending, standing, squatting, stairs, transfers, bed mobility,  continence, bathing, toileting, dressing, hygiene/grooming, and caring for others  PARTICIPATION LIMITATIONS: meal prep, cleaning, laundry, driving, shopping, community activity, occupation, yard work, school, church, and caring for others  PERSONAL FACTORS: 3+ contributing Co morbidities, limited transportation, consistent, ongoing availability of caregivers, financial limitations are also affecting patient's functional outcome.   REHAB POTENTIAL: Lymphedema management has a high burden of care, especially for Patients who have difficulty, or who are unable to reach their distal legs and feet to apply bandages, compression garments, to bathe, inspect skin and groom nails. In this case because Pt is unable to perform these activities, daily caregiver assistance with all lower extremity lymphedema self-care home program components is essential for achieving clinical success and ongoing self-management. Without consistent, ongoing, daily caregiver assistance for compression bandaging and garments, skin care, and MLD Pt's prognosis is poor. With consistent caregiver assistance with lymphedema home program Pt's prognosis for improved lymphedema control and reducing infection risk is guarded, at best, due to the advanced stage, multiple contributing co-morbidities, and sedentary lifestyle.  EVALUATION COMPLEXITY: High   GOALS: Goals reviewed with patient? YES  SHORT TERM GOALS: Target date: 6th OT visit   Pt will demonstrate understanding of lymphedema precautions and prevention strategies with modified independence using a printed reference to identify at least 5 precautions and discussing how s/he may implement them into daily life to reduce risk of progression with modified assistance. Baseline: Max A Goal status:05/08/22 ONGOING  2.   With Max caregiver assistance Pt will be able to apply multilayer, knee length, compression wraps using gradient techniques to decrease limb volume, to limit  infection risk, and to limit lymphedema progression.  Baseline: Dependent Goal status: 05/08/22 GOAL MET  LONG TERM GOALS: Target date: 05/30/22  Given this patient's Intake score of 44/100% on the functional outcomes FOTO tool, patient will experience an increase in function of 3  points to improve basic and instrumental ADLs performance, including lymphedema self-care. Baseline: 44/100 Goal status: 05/08/22 ONGOING  2.  Given this patient's Intake score of TBA % on the Lymphedema Life Impact Scale (LLIS), patient will experience a reduction of at least 5% in her perceived level of functional impairment resulting from lymphedema to improve functional performance and quality of life (QOL). Baseline: TBA Goal status: 05/08/22 ONGOING  3.  With max caregiver assistance Pt will be able to don and doff appropriate compression garments and/or devices to control BLE lymphedema and to limit progression.  Baseline: Dependent Goal status: 05/08/22 ONGOING. Limb volume not reduced enough  to date to consider garment measurement and fitting.  4.  With tolerance building strategies over time  Pt will be able to tolerate compression bandages and garments for at least 24 hours  to control BLE lymphedema and to limit progression.  Baseline: Max A Goal status: 05/08/22 GOAL MET for Bandages. ONGOING for garments.  5.  With Max caregiver assistance Pt will achieve and sustain no less than 85% compliance with all LE self-care home program components throughout CDT, including modified simple self-MLD, daily skin care and inspection, lymphatic pumping the ex and appropriate compression to limit lymphedema progression and to limit further functional decline. Baseline: Dependent Goal status: 05/08/22 ONGOING  6.  Pt will diligently perform skin care regime prescribed by her doctor to eliminate fungal infection and prevent reoccurrence for 90 days Baseline: Dependent Goal status: 05/08/22 ONGOING   OT PLAN OF  CARE:  PT FREQUENCY: 2x/week  PT DURATION: other: 12 weeks and PRN  . 05/08/22 Due to the severity of this case and double limb involvement it is expected that OT for CDT will quite a bit longer THAT INITIAL 12 WEEK POC  PLANNED INTERVENTIONS: Therapeutic exercises, Therapeutic activity, Patient/Family education, Self Care, DME instructions, Manual lymph drainage, Compression bandaging, Vasopneumatic device, Manual therapy, and skin care  PLAN FOR NEXT SESSION:  Progress assessment. Skin care MLD Multilayer wraps Pt/ family edu Loel Dubonnet, MS, OTR/L, [facility] 08/09/22 3:18 PM

## 2022-08-10 ENCOUNTER — Encounter: Payer: Medicare HMO | Admitting: Occupational Therapy

## 2022-08-15 ENCOUNTER — Ambulatory Visit: Payer: Medicare HMO | Admitting: Occupational Therapy

## 2022-08-15 ENCOUNTER — Encounter: Payer: Medicare HMO | Admitting: Occupational Therapy

## 2022-08-15 DIAGNOSIS — I890 Lymphedema, not elsewhere classified: Secondary | ICD-10-CM | POA: Diagnosis not present

## 2022-08-16 ENCOUNTER — Encounter: Payer: Medicare HMO | Admitting: Occupational Therapy

## 2022-08-16 ENCOUNTER — Ambulatory Visit: Payer: Self-pay

## 2022-08-16 ENCOUNTER — Encounter: Payer: Self-pay | Admitting: Occupational Therapy

## 2022-08-16 NOTE — Patient Outreach (Signed)
  Care Coordination   Follow Up Visit Note   08/16/2022 Name: [name] MRN: [id number] DOB: 30-Apr-1965  [name] is a 57 y.o. year old female who sees Lauren Rigg, NP for primary care. I spoke with  [name] by phone today.  What matters to the patients health and wellness today?  Patients reports she is doing well at this time    Goals Addressed             This Visit's Progress    COMPLETED: Care Coordination Activities       Care Coordination Interventions: Determined the patient has accessed [facility] for transportation services to [facility] without barriers Discussed the patient plans to begin with a new Lymphedema clinic in July that is closer to home. She will use [employer] benefit for these appointments Assessed for ongoing SW needs - none identified          SDOH assessments and interventions completed:  Yes  SDOH Interventions Today    Flowsheet Row Most Recent Value  SDOH Interventions   Transportation Interventions Intervention Not Indicated  [Pt has [employer] and [employer] and undestands how to access both resources]        Care Coordination Interventions:  Yes, provided   Interventions Today    Flowsheet Row Most Recent Value  Chronic Disease   Chronic disease during today's visit Other  General Interventions   General Interventions Discussed/Reviewed General Interventions Reviewed, Doctor Visits  Doctor Visits Discussed/Reviewed Doctor Visits Discussed        Follow up plan: No further intervention required. The patient will remain engaged with RN Care Manager [name].    Encounter Outcome:  Pt. Visit Completed   [name], BSW, CDP Social Worker, Certified Dementia Practitioner [facility] Care Management  Care Coordination [phone]

## 2022-08-16 NOTE — Therapy (Signed)
OUTPATIENT OCCUPATIONAL THERAPY TREATMENT NOTE AND PROGRESS REPORT  BILATERAL  LOWER EXTREMITY LYMPHEDEMA  Patient Name: [name] MRN: [id number] DOB:Apr 07, 1965, 57 y.o., female Today's Date: 08/16/2022  REPORTING PERIOD: 06/20/22 - 08/15/22  END OF SESSION:    OT End of Session - 08/15/22 1320     Visit Number 20    Number of Visits 36    Date for OT Re-Evaluation 09/13/22    OT Start Time 0110    OT Stop Time 0215    OT Time Calculation (min) 65 min    Activity Tolerance Patient tolerated treatment well;No increased pain;Other (comment)   Pt limited by extreme leg swelling   Behavior During Therapy [facility] for tasks assessed/performed             Past Medical History:  Diagnosis Date   Anemia    Anxiety    Arthritis    Asthma    hx as child - no prob as adult - no inhaler   Blood transfusion 01/22/11   transfusion 2 units at [facility]   Depression 08/2010   psych assessment   Dyspnea    Fibroid    Headache(784.0)    rx for imitrex - last one jan   Keloid    Lymphedema    Pulmonary embolism (HCC)    Past Surgical History:  Procedure Laterality Date   ABDOMINAL HYSTERECTOMY     COLONOSCOPY N/A 11/08/2017   Procedure: COLONOSCOPY;  Surgeon: West Bali, MD;  Location: [facility];  Service: Endoscopy;  Laterality: N/A;  2:00pm   FLEXIBLE SIGMOIDOSCOPY N/A 09/17/2017   Procedure: FLEXIBLE SIGMOIDOSCOPY;  Surgeon: West Bali, MD;  Location: [facility];  Service: Endoscopy;  Laterality: N/A;   HERNIA REPAIR  2009   umbilical   POLYPECTOMY  11/08/2017   Procedure: POLYPECTOMY;  Surgeon: West Bali, MD;  Location: [facility];  Service: Endoscopy;;  ascending colon (CSx1), transverse colon (CS x1), splenic flexure (HSx1)   SVD     Spontaneous vaginal delivery; x 1   Patient Active Problem List   Diagnosis Date Noted   Rheumatoid arthritis (HCC) 03/06/2022   Lymphedema 03/06/2022   Morbid obesity (HCC) 09/14/2021   Chest pain 04/07/2021    Screening for malignant neoplasm of colon 03/09/2021   Constipation 03/09/2021   Iron deficiency anemia 03/09/2021   Body mass index (BMI) 45.0-49.9, adult (HCC) 08/20/2020   Chronic fatigue syndrome 08/20/2020   Polyarthralgia 08/20/2020   Vitamin D deficiency 08/20/2020   Cellulitis 12/31/2018   Elephantiasis 12/31/2018   Ischemic chest pain (HCC) 12/31/2018   History of pulmonary embolus (PE) 12/31/2018   Physical deconditioning 08/27/2018   Adenopathy 09/15/2017   Family history of colon cancer 08/01/2017   Taking multiple medications for chronic disease 08/01/2017   Axillary lymphadenopathy 01/26/2017   Urinary incontinence 01/25/2017   Hypokalemia 01/25/2017   Lobar pneumonia (HCC) 01/25/2017   Pressure ulcer of sacral region, stage 2 (HCC) 01/25/2017   Abnormal ECG 01/29/2013   Dyspnea    Menorrhagia with regular cycle 02/22/2011   Anemia 02/22/2011    PCP: Loreen Freud, NP  REFERRING PROVIDER: Geralynn Rile, DPM  REFERRING DIAG: I89.0  THERAPY DIAG:  Lymphedema, not elsewhere classified  ONSET DATE: 2018  SUBJECTIVE:  SUBJECTIVE STATEMENT:  [name] presents to Occupational Therapy for lymphedema care to BLE , stage 3, elephantiasis. Pt is on time for 1 hr  Rx session.  Pt presents without LLE, knee length compression wraps in place. She brings clean, rolled wraps as instructed. Pt reports current arranged transportation continues to work well for her.  Lymphedema related pain is unchanged. Pain in L foot is ongoing. Neither location is rated numerically.  PERTINENT HISTORY:  Severe, stage IV, BLE Lymphedema (Elephantiasis [place]). ENV is a complication of sever, chronic, progressive Lymphedema (with hyperkeratosis, hyperplasia, hyperpigmentation, papillomatosis,  skin breakdown with lymphorrhea, elephantiasis) HTN, blood clot disorder with PE x 2 in 2018, RA, Hx recurrent cellulitis, severe morbid obesity (body mass index > 40.4 kg/m2).RA, deconditioning, hx pressure ulcer sacrum, hx non-pressure leg wound, hx depression hx chronic fatigue, hx keloids PAIN:  Are you having pain? : yes- LE related not rated /10 Pain location: BLE; L foot Pain description: Lupus. Lymphedema: heaviness, tightness, fullness, sore Aggravating: weight bearing, functional upper extremity use against gravity Relieving factors: rest, elevation   PRECAUTIONS: Fall, recurrent infections, hx recurrent non-pressure leg wound;    IMPAIRMENTS AND FUNCTIONAL LIMITATIONS: difficulty walking, impaired functional mobility; chronic, progressive limb swelling with associated pain and disfigurement, impaired balance, generalized debility, decreased BLE AROM, decreased strength, impaired endurance, thickened skin limits flexibility  Dependent for Le SELF CARE; impaired basic and instrumental ADLs ( bathing, dressing, grooming (skin inspection, skin and nail care), impaired functional ambulation, transfers and mobility, impaired home management, shopping, unable to cook and prep food, unable to drive, dependent for wbc propulsion, unable to work, impaired social participation, impaired body image  PRIOR LEVEL OF FUNCTION: Requires assistive devices and varying levels of assistance with basic and instrumental ADLs, Needs assistance with functional ambulation, all transfers and functional mobility. Unable to work. Housebound due to impaired mobility and relies on others for transportation  PATIENT GOALS: Initial evaluation goal: keep swelling from getting worse so I don't lose my leg. Re-eval gal: 06/14/22: get my legs small enough to be able to walk without a walker.   OBJECTIVE:   OBSERVATIONS /ASSESSMENTS:  FOTO (functional outcomes measure): Intake score 03/01/22 44/100%  Lymphedema Life  Impact Scale (LLIS) 61.76% (How much lymphedema related problems interfered with your life last week)  BLE COMPARATIVE LIMB VOLUMETRICS: 03/14/22 INITIAL   LANDMARK LEFT   R LEG (A-D) 14499.2 ml  R THIGH (E-G) 17195.4 ml  R FULL LIMB (A-G) 31694.6 ml  Limb Volume differential (LVD)  %  Volume change since initial %  Volume change overall %  (Blank rows = not tested)   LLE COMPARATIVE LIMB VOLUMETRICS: 05/09/22 10 th visit  LANDMARK LEFT   L LEG (A-D) 15607.0 ml  L THIGH (E-G)  21709.3 ml  L FULL LIMB (A-G) 37316.4 ml  Limb Volume differential (LVD)  %  Volume change since initial R Leg (A-D) Increased in volume by 7.6% L thigh (E-G) INC by 26.25% L Full limb (E-G) is INC by 17.7%.   Volume change overall %   LLE COMPARATIVE LIMB VOLUMETRICS: 08/15/22  20 th visit: TBA next session 2/2 time constraints  LANDMARK LEFT    L LEG (A-D)  ml  L THIGH (E-G)  ml  L FULL LIMB (A-G) ml  Limb Volume differential (LVD)  %  Volume change since initial    Volume change overall %   TODAY'S TREATMENT:  Pt edu and progress review LLE MLD and simultaneous skin care Multilayer knee length compression wraps to L Leg as established.  PATIENT EDUCATION:  Continued Pt edu re progress towards all OT goals to date.  Continued Pt/ CG edu for lymphedema self care home program throughout session. Topics include outcome of comparative limb volumetrics- starting limb volume differentials (LVDs), technology and gradient techniques used for short stretch, multilayer compression wrapping, simple self-MLD, therapeutic lymphatic pumping exercises, skin/nail care, LE precautions,. compression garment recommendations and specifications, wear and care schedule and compression garment donning / doffing w assistive devices. Discussed progress towards all OT goals since commencing CDT.  All questions answered to the Pt's satisfaction.  Person educated: Patient Education method: Explanation, Demonstration, Verbal and Tactile cues, and Handouts Education comprehension: verbalized understanding and returned demonstration  HOME EXERCISE PROGRAM: Daily , multilayer compression bandages to one limb at a time only during Intensive Phase CDT. Daily day and HOS  compression garments during Self-management Phase. Daily skin care w low ph lotion Daily lymphatic pumping there ex, 1 set of 10 each element, in order, bilaterally Daily simple self-MLD  Lymphedema Self- Care Instructions   1. EXERCISE: Perform lymphatic pumping there ex at least 2 x a day while wearing your compression wraps or garments. Perform 10 reps of each exercise bilaterally and be sure to perform them in order. Don't skip around!  OMIT PARTIAL SIT Ups.  2. MLD: Perform simple self-manual lymphatic drainage (MLD) at least once a day as directed. Take your time! Breathe! ;-)  3. If you have a Flexitouch advanced "pump" use it 1 time each day on a single limb only. The Flexitouch moves lymphatic fluid out of your affected body part and back to your heart, so DO NOT use the Flexi on 2 legs at a time, and DO NOT cues it on 2 legs on the same day. If you experience any atypical shortness of breath, sudden onset of pain, or feelings of heart arhythmia, or racing, discontinue use of the Flexitouch and report these symptoms to your doctor right away. Also, discontinue Flexi if you have an infection or a fever. It's OK to resume using the device 72 hours AFTER your first dose of oral antibiotic.   4. 4. WRAPS: Compression wraps are to be worn 23 hrs/ 7 days/wk during Intensive Phase of Complete Decongestive Therapy (CDT).Building tolerance may take time and practice, so don't get discouraged. If bandages begin to feel tight during periods of inactivity and/or during the night, try performing your exercises to loosen them. Do not  leave short stretch wraps in place for > 23 hours. It is very important that you remove all wraps daily to inspect skin, bathe and perform skin care before reapplying your wraps.  5. Daytime GARMENTS/ HOS DEVICES: During the Self-Management Phase CDT your compression garments are to be worn during waking hours when active. Do NOT sleep in your garments!!  Don daytime garments first thing in the morning. Do not wear your HOS devices all day instead of garments. These will not contain your swelling.  6. PUT YOUR FEET UP! Elevate your feet and legs and feet to the level of your heart whenever you are sitting down.   7. SKIN: Carefully monitor skin condition and perform impeccable hygiene daily. Bathe skin with mild soap and water and apply low pH lotion (aka Eucerin ) to improve hydration and limit infection risk.   ASSESSMENT: CLINICAL IMPRESSION:  Pt continues to demonstrate slow, but steady progress  towards all OT goals for lymphedema care. Attendance and tardiness have improved greatly  with new ride service. This enables more time for manual therapy and all other aspects of CDT. Please see GOALS section for details. Pt has a ery long way to go in terms of affecting change to skin condition as it remains densely encrusted with severe papillomatosis. Skin is very thick and nearly immovable and inflexible firm mid calf to base of foot such that active ankle AROM is severely limited.  Provided simple passive skin stretch and active assistive stretching techniques In an effort to improve skin excursion and lymphatic transport. Skin plaques, cobblestones, and lymphatic cysts have flattened out somewhat, but largely remain unchanged. Papilloma may have receded about an inch in the anterior and medial L leg.  Pt tolerated LLE/LLQ MLD without increased pain today. Wraps applied as established. Cont as per POC.   OBJECTIVE IMPAIRMENTS: Abnormal gait, decreased activity tolerance, decreased balance, decreased  endurance, decreased knowledge of condition, decreased knowledge of use of DME, decreased mobility, difficulty walking, decreased ROM, decreased strength, dizziness, hypomobility, increased edema, increased fascial restrictions, impaired flexibility, impaired sensation, postural dysfunction, obesity, pain, and chronic, progressive swelling .   ACTIVITY LIMITATIONS: carrying, lifting, bending, standing, squatting, stairs, transfers, bed mobility, continence, bathing, toileting, dressing, hygiene/grooming, and caring for others  PARTICIPATION LIMITATIONS: meal prep, cleaning, laundry, driving, shopping, community activity, occupation, yard work, school, church, and caring for others  PERSONAL FACTORS: 3+ contributing Co morbidities, limited transportation, consistent, ongoing availability of caregivers, financial limitations are also affecting patient's functional outcome.   REHAB POTENTIAL: Lymphedema management has a high burden of care, especially for Patients who have difficulty, or who are unable to reach their distal legs and feet to apply bandages, compression garments, to bathe, inspect skin and groom nails. In this case because Pt is unable to perform these activities, daily caregiver assistance with all lower extremity lymphedema self-care home program components is essential for achieving clinical success and ongoing self-management. Without consistent, ongoing, daily caregiver assistance for compression bandaging and garments, skin care, and MLD Pt's prognosis is poor. With consistent caregiver assistance with lymphedema home program Pt's prognosis for improved lymphedema control and reducing infection risk is guarded, at best, due to the advanced stage, multiple contributing co-morbidities, and sedentary lifestyle.  EVALUATION COMPLEXITY: High  GOALS: Goals reviewed with patient? YES  SHORT TERM GOALS: Target date: 6th OT visit   Pt will demonstrate understanding of lymphedema precautions  and prevention strategies with modified independence using a printed reference to identify at least 5 precautions and discussing how s/he may implement them into daily life to reduce risk of progression with modified assistance. Baseline: Max A Goal status: 08/15/22 GOAL MET  2.   With Max caregiver assistance Pt will be able to apply multilayer, knee length, compression wraps using gradient techniques to decrease limb volume, to limit infection risk, and to limit lymphedema progression.  Baseline: Dependent Goal status: 05/08/22 GOAL MET  LONG TERM GOALS: Target date: 05/30/22  Given this patient's Intake score of 44/100% on the functional outcomes FOTO tool, patient will experience an increase in function of 3  points to improve basic and instrumental ADLs performance, including lymphedema self-care. Baseline: 44/100 Goal status: 05/08/22 ONGOING 08/15/22 PROGRESSING  2.  Given this patient's Intake score of TBA % on the Lymphedema Life Impact Scale (LLIS), patient will experience a reduction of at least 5% in her perceived level of functional impairment resulting from lymphedema to improve functional performance and quality  of life (QOL). Baseline: TBA Goal status: 05/08/22 ONGOING. 08/15/22 PROGRESSING  3.  With max caregiver assistance Pt will be able to don and doff appropriate compression garments and/or devices to control BLE lymphedema and to limit progression.  Baseline: Dependent Goal status: 05/08/22 ONGOING. Limb volume not reduced enough to date to consider garment measurement and fitting. 08/15/22 Daughter has not assisted Pt with compression wrapping since soon after commencing CDT. Unfortunately, Pt is unable to wrap herself, so she ends up wearing bandages for 3-4 days longer than recommended. Pt verbalizes understanding that daily bandage changes, skin inspection and skin care are the standard of care  during Intensive Phase CDT. but, since she continues to make volume reduction progress  and skin has continued to improve without signs/ symptoms of infection, we compromised and ordered adjustable , half leg CircAids and custom foot pieced bilaterally. She can use these after removing bandages between visits since  they are easier to donn and doff.    4.  With tolerance building strategies over time  Pt will be able to tolerate compression bandages and garments for at least 24 hours  to control BLE lymphedema and to limit progression.  Baseline: Max A Goal status: 05/08/22 GOAL MET for Bandages. ONGOING for CircAids.  5.  With Max caregiver assistance Pt will achieve and sustain no less than 85% compliance with all LE self-care home program components throughout CDT, including modified simple self-MLD, daily skin care and inspection, lymphatic pumping the ex and appropriate compression to limit lymphedema progression and to limit further functional decline. Baseline: Dependent Goal status: 08/15/22 PROGRESSING. Currently diligent w daily skin care and MLD where she is able to reach, 100% by report. But Pt is unable to donn/ doff compression wraps- between visits currently 0% of the time   6.  Pt will diligently perform skin care regime prescribed by her doctor to eliminate fungal infection and prevent reoccurrence for 90 days Baseline: Dependent Goal status: 05/08/22 08/15/22 GOAL MET BY REPORT.   OT PLAN OF CARE:  PT FREQUENCY: 2x/week  PT DURATION: other: 12 weeks and PRN  . 05/08/22 Due to the severity of this case and double limb involvement it is expected that OT for CDT will quite a bit longer THAT INITIAL 12 WEEK POC  PLANNED INTERVENTIONS: Therapeutic exercises, Therapeutic activity, Patient/Family education, Self Care, DME instructions, Manual lymph drainage, Compression bandaging, Vasopneumatic device, Manual therapy, and skin care  PLAN FOR NEXT SESSION:  Skin care MLD FIT CircAids ASAP  Pt/ family edu for LE self-care  Loel Dubonnet, MS, OTR/L, CLT-LANA 08/16/22  9:55 AM

## 2022-08-16 NOTE — Patient Instructions (Signed)
Visit Information  Thank you for taking time to visit with me today. Please don't hesitate to contact me if I can be of assistance to you.   Following are the goals we discussed today:  - Continue accessing [facility] for transportation needs to [facility] your primary care providers office as needed   If you are experiencing a Mental Health or Behavioral Health Crisis or need someone to talk to, please call 1-800-273-TALK (toll free, 24 hour hotline) go to [facility] [address] [place] [phone]) call 911  Patient verbalizes understanding of instructions and care plan provided today and agrees to view in MyChart. Active MyChart status and patient understanding of how to access instructions and care plan via MyChart confirmed with patient.     No further follow up required: Please contact me as needed.  Bevelyn Ngo, BSW, CDP Social Worker, Certified Dementia Practitioner [facility] Care Management  Care Coordination [phone]

## 2022-08-17 ENCOUNTER — Ambulatory Visit: Payer: Medicare HMO | Admitting: Occupational Therapy

## 2022-08-17 ENCOUNTER — Encounter: Payer: Medicare HMO | Admitting: Occupational Therapy

## 2022-08-17 DIAGNOSIS — I890 Lymphedema, not elsewhere classified: Secondary | ICD-10-CM | POA: Diagnosis not present

## 2022-08-17 NOTE — Therapy (Signed)
OUTPATIENT OCCUPATIONAL THERAPY TREATMENT NOTE AND PROGRESS REPORT  BILATERAL  LOWER EXTREMITY LYMPHEDEMA  Patient Name: [name] MRN: [id number] DOB:[birth year], 57 y.o., female Today's Date: 08/17/2022  REPORTING PERIOD: 06/20/22 - 08/15/22  END OF SESSION:    OT End of Session - 08/17/22 1313     Visit Number 21    Number of Visits 36    Date for OT Re-Evaluation 09/13/22    OT Start Time 0105    OT Stop Time 0220    OT Time Calculation (min) 75 min    Activity Tolerance Patient tolerated treatment well;No increased pain;Other (comment)   Pt limited by extreme leg swelling   Behavior During Therapy [facility] for tasks assessed/performed             Past Medical History:  Diagnosis Date   Anemia    Anxiety    Arthritis    Asthma    hx as child - no prob as adult - no inhaler   Blood transfusion 01/22/11   transfusion 2 units at [facility]   Depression 08/2010   psych assessment   Dyspnea    Fibroid    Headache(784.0)    rx for imitrex - last one jan   Keloid    Lymphedema    Pulmonary embolism (HCC)    Past Surgical History:  Procedure Laterality Date   ABDOMINAL HYSTERECTOMY     COLONOSCOPY N/A 11/08/2017   Procedure: COLONOSCOPY;  Surgeon: West Bali, MD;  Location: [facility];  Service: Endoscopy;  Laterality: N/A;  2:00pm   FLEXIBLE SIGMOIDOSCOPY N/A 09/17/2017   Procedure: FLEXIBLE SIGMOIDOSCOPY;  Surgeon: West Bali, MD;  Location: [facility];  Service: Endoscopy;  Laterality: N/A;   HERNIA REPAIR  2009   umbilical   POLYPECTOMY  11/08/2017   Procedure: POLYPECTOMY;  Surgeon: West Bali, MD;  Location: [facility];  Service: Endoscopy;;  ascending colon (CSx1), transverse colon (CS x1), splenic flexure (HSx1)   SVD     Spontaneous vaginal delivery; x 1   Patient Active Problem List   Diagnosis Date Noted   Rheumatoid arthritis (HCC) 03/06/2022   Lymphedema 03/06/2022   Morbid obesity (HCC) 09/14/2021   Chest pain 04/07/2021    Screening for malignant neoplasm of colon 03/09/2021   Constipation 03/09/2021   Iron deficiency anemia 03/09/2021   Body mass index (BMI) 45.0-49.9, adult (HCC) 08/20/2020   Chronic fatigue syndrome 08/20/2020   Polyarthralgia 08/20/2020   Vitamin D deficiency 08/20/2020   Cellulitis 12/31/2018   Elephantiasis 12/31/2018   Ischemic chest pain (HCC) 12/31/2018   History of pulmonary embolus (PE) 12/31/2018   Physical deconditioning 08/27/2018   Adenopathy 09/15/2017   Family history of colon cancer 08/01/2017   Taking multiple medications for chronic disease 08/01/2017   Axillary lymphadenopathy 01/26/2017   Urinary incontinence 01/25/2017   Hypokalemia 01/25/2017   Lobar pneumonia (HCC) 01/25/2017   Pressure ulcer of sacral region, stage 2 (HCC) 01/25/2017   Abnormal ECG 01/29/2013   Dyspnea    Menorrhagia with regular cycle 02/22/2011   Anemia 02/22/2011    PCP: Loreen Freud, NP  REFERRING PROVIDER: Geralynn Rile, DPM  REFERRING DIAG: I89.0  THERAPY DIAG:  Lymphedema, not elsewhere classified  ONSET DATE: 2018  SUBJECTIVE:  SUBJECTIVE STATEMENT:  Edla Burnor presents to Occupational Therapy for lymphedema care to BLE , stage 3, elephantiasis. Pt is on time for 1 hr  Rx session.  Pt presents with LLE, knee length compression wraps applied at last session in place. She forgot to bring clean wraps for replacement. Pt states Lymphedema related pain is unchanged. Pain in L foot is ongoing. Neither location is rated numerically. Pt encouraged to report intractable foot pain and discuss any possible underlying orthopedic problems.   PERTINENT HISTORY:  Severe, stage IV, BLE Lymphedema (Elephantiasis [place]). ENV is a complication of sever, chronic, progressive Lymphedema (with  hyperkeratosis, hyperplasia, hyperpigmentation, papillomatosis, skin breakdown with lymphorrhea, elephantiasis) HTN, blood clot disorder with PE x 2 in 2018, RA, Hx recurrent cellulitis, severe morbid obesity (body mass index > 40.4 kg/m2).RA, deconditioning, hx pressure ulcer sacrum, hx non-pressure leg wound, hx depression hx chronic fatigue, hx keloids PAIN:  Are you having pain? : yes- LE related not rated /10 Pain location: BLE; L foot Pain description: Lupus. Lymphedema: heaviness, tightness, fullness, sore Aggravating: weight bearing, walking, standing Relieving factors: rest, elevation   PRECAUTIONS: Fall, recurrent infections, hx recurrent non-pressure leg wound;    IMPAIRMENTS AND FUNCTIONAL LIMITATIONS: difficulty walking, impaired functional mobility; chronic, progressive limb swelling with associated pain and disfigurement, impaired balance, generalized debility, decreased BLE AROM, decreased strength, impaired endurance, thickened skin limits flexibility  Dependent for Le SELF CARE; impaired basic and instrumental ADLs ( bathing, dressing, grooming (skin inspection, skin and nail care), impaired functional ambulation, transfers and mobility, impaired home management, shopping, unable to cook and prep food, unable to drive, dependent for wbc propulsion, unable to work, impaired social participation, impaired body image  PRIOR LEVEL OF FUNCTION: Requires assistive devices and varying levels of assistance with basic and instrumental ADLs, Needs assistance with functional ambulation, all transfers and functional mobility. Unable to work. Housebound due to impaired mobility and relies on others for transportation  PATIENT GOALS: Initial evaluation goal: keep swelling from getting worse so I don't lose my leg. Re-eval gal: 06/14/22: get my legs small enough to be able to walk without a walker.   OBJECTIVE:   OBSERVATIONS /ASSESSMENTS:  FOTO (functional outcomes measure): Intake score  03/01/22 44/100%  Lymphedema Life Impact Scale (LLIS) 61.76% (How much lymphedema related problems interfered with your life last week)  BLE COMPARATIVE LIMB VOLUMETRICS: 03/14/22 INITIAL   LANDMARK LEFT   R LEG (A-D) 14499.2 ml  R THIGH (E-G) 17195.4 ml  R FULL LIMB (A-G) 31694.6 ml  Limb Volume differential (LVD)  %  Volume change since initial %  Volume change overall %  (Blank rows = not tested)   LLE COMPARATIVE LIMB VOLUMETRICS: 05/09/22 10 th visit  LANDMARK LEFT   L LEG (A-D) 15607.0 ml  L THIGH (E-G)  21709.3 ml  L FULL LIMB (A-G) 37316.4 ml  Limb Volume differential (LVD)  %  Volume change since initial R Leg (A-D) Increased in volume by 7.6% L thigh (E-G) INC by 26.25% L Full limb (E-G) is INC by 17.7%.   Volume change overall %   LLE COMPARATIVE LIMB VOLUMETRICS: 08/15/22  20 th visit: TBA next session 2/2 time constraints  LANDMARK LEFT    L LEG (A-D)  ml  L THIGH (E-G)  ml  L FULL LIMB (A-G) ml  Limb Volume differential (LVD)  %  Volume change since initial    Volume change overall %   TODAY'S TREATMENT:  Pt edu and progress review LLE MLD and simultaneous skin care Multilayer knee length compression wraps to L Leg as established.  PATIENT EDUCATION:  Continued Pt edu re progress towards all OT goals to date.  Continued Pt/ CG edu for lymphedema self care home program throughout session. Topics include outcome of comparative limb volumetrics- starting limb volume differentials (LVDs), technology and gradient techniques used for short stretch, multilayer compression wrapping, simple self-MLD, therapeutic lymphatic pumping exercises, skin/nail care, LE precautions,. compression garment recommendations and specifications, wear and care schedule and compression garment donning / doffing w assistive devices. Discussed progress towards  all OT goals since commencing CDT. All questions answered to the Pt's satisfaction.  Person educated: Patient Education method: Explanation, Demonstration, Verbal and Tactile cues, and Handouts Education comprehension: verbalized understanding and returned demonstration  HOME EXERCISE PROGRAM: Daily , multilayer compression bandages to one limb at a time only during Intensive Phase CDT. Daily day and HOS  compression garments during Self-management Phase. Daily skin care w low ph lotion Daily lymphatic pumping there ex, 1 set of 10 each element, in order, bilaterally Daily simple self-MLD  Lymphedema Self- Care Instructions   1. EXERCISE: Perform lymphatic pumping there ex at least 2 x a day while wearing your compression wraps or garments. Perform 10 reps of each exercise bilaterally and be sure to perform them in order. Don't skip around!  OMIT PARTIAL SIT Ups.  2. MLD: Perform simple self-manual lymphatic drainage (MLD) at least once a day as directed. Take your time! Breathe! ;-)  3. If you have a Flexitouch advanced "pump" use it 1 time each day on a single limb only. The Flexitouch moves lymphatic fluid out of your affected body part and back to your heart, so DO NOT use the Flexi on 2 legs at a time, and DO NOT cues it on 2 legs on the same day. If you experience any atypical shortness of breath, sudden onset of pain, or feelings of heart arhythmia, or racing, discontinue use of the Flexitouch and report these symptoms to your doctor right away. Also, discontinue Flexi if you have an infection or a fever. It's OK to resume using the device 72 hours AFTER your first dose of oral antibiotic.   4. 4. WRAPS: Compression wraps are to be worn 23 hrs/ 7 days/wk during Intensive Phase of Complete Decongestive Therapy (CDT).Building tolerance may take time and practice, so don't get discouraged. If bandages begin to feel tight during periods of inactivity and/or during the night, try performing  your exercises to loosen them. Do not leave short stretch wraps in place for > 23 hours. It is very important that you remove all wraps daily to inspect skin, bathe and perform skin care before reapplying your wraps.  5. Daytime GARMENTS/ HOS DEVICES: During the Self-Management Phase CDT your compression garments are to be worn during waking hours when active. Do NOT sleep in your garments!!  Don daytime garments first thing in the morning. Do not wear your HOS devices all day instead of garments. These will not contain your swelling.  6. PUT YOUR FEET UP! Elevate your feet and legs and feet to the level of your heart whenever you are sitting down.   7. SKIN: Carefully monitor skin condition and perform impeccable hygiene daily. Bathe skin with mild soap and water and apply low pH lotion (aka Eucerin ) to improve hydration and limit infection risk.   ASSESSMENT: CLINICAL IMPRESSION:  Dried blood and serous fluid observed on stockinett  once bandages applied at last session were removed. Persistent fistula remains active regardless of multilayer gradient compression. Odor is faint but smells  fungal in origin. Pt encouraged to report fungal infection as well as intractable L foot pain to her doctor, and discuss any possible underlying orthopedic problems. Anatomy of the L foot and distal leg is difficult to palpate due to habitus, but it feels like bony alignment and sole of the foot is displaced somewhat  laterally as Pt walks over pronated. Pt tolerated MLD and simultaneous skin care with pain with light palpation over heel. Compression wraps applied as established. Pt encouraged to obtain hand held shower  and shower chair so she can sit while bathing feet and legs thoroughly. Pt has a difficult time reaching distal legs and feet. Consequently dead skin and debris build up is slowing progress and contributing to increased infection risk. These devices would also reduce fall risk significantly. Cont as  per POC.  OBJECTIVE IMPAIRMENTS: Abnormal gait, decreased activity tolerance, decreased balance, decreased endurance, decreased knowledge of condition, decreased knowledge of use of DME, decreased mobility, difficulty walking, decreased ROM, decreased strength, dizziness, hypomobility, increased edema, increased fascial restrictions, impaired flexibility, impaired sensation, postural dysfunction, obesity, pain, and chronic, progressive swelling .   ACTIVITY LIMITATIONS: carrying, lifting, bending, standing, squatting, stairs, transfers, bed mobility, continence, bathing, toileting, dressing, hygiene/grooming, and caring for others  PARTICIPATION LIMITATIONS: meal prep, cleaning, laundry, driving, shopping, community activity, occupation, yard work, school, church, and caring for others  PERSONAL FACTORS: 3+ contributing Co morbidities, limited transportation, consistent, ongoing availability of caregivers, financial limitations are also affecting patient's functional outcome.   REHAB POTENTIAL: Lymphedema management has a high burden of care, especially for Patients who have difficulty, or who are unable to reach their distal legs and feet to apply bandages, compression garments, to bathe, inspect skin and groom nails. In this case because Pt is unable to perform these activities, daily caregiver assistance with all lower extremity lymphedema self-care home program components is essential for achieving clinical success and ongoing self-management. Without consistent, ongoing, daily caregiver assistance for compression bandaging and garments, skin care, and MLD Pt's prognosis is poor. With consistent caregiver assistance with lymphedema home program Pt's prognosis for improved lymphedema control and reducing infection risk is guarded, at best, due to the advanced stage, multiple contributing co-morbidities, and sedentary lifestyle.  EVALUATION COMPLEXITY: High  GOALS: Goals reviewed with patient?  YES  SHORT TERM GOALS: Target date: 6th OT visit   Pt will demonstrate understanding of lymphedema precautions and prevention strategies with modified independence using a printed reference to identify at least 5 precautions and discussing how s/he may implement them into daily life to reduce risk of progression with modified assistance. Baseline: Max A Goal status: 08/15/22 GOAL MET  2.   With Max caregiver assistance Pt will be able to apply multilayer, knee length, compression wraps using gradient techniques to decrease limb volume, to limit infection risk, and to limit lymphedema progression.  Baseline: Dependent Goal status: 05/08/22 GOAL MET  LONG TERM GOALS: Target date: 05/30/22  Given this patient's Intake score of 44/100% on the functional outcomes FOTO tool, patient will experience an increase in function of 3  points to improve basic and instrumental ADLs performance, including lymphedema self-care. Baseline: 44/100 Goal status: 05/08/22 ONGOING 08/15/22 PROGRESSING  2.  Given this patient's Intake score of TBA % on the Lymphedema Life Impact Scale (LLIS), patient will experience a reduction of at least 5% in her perceived level of  functional impairment resulting from lymphedema to improve functional performance and quality of life (QOL). Baseline: TBA Goal status: 05/08/22 ONGOING. 08/15/22 PROGRESSING  3.  With max caregiver assistance Pt will be able to don and doff appropriate compression garments and/or devices to control BLE lymphedema and to limit progression.  Baseline: Dependent Goal status: 05/08/22 ONGOING. Limb volume not reduced enough to date to consider garment measurement and fitting. 08/15/22 Daughter has not assisted Pt with compression wrapping since soon after commencing CDT. Unfortunately, Pt is unable to wrap herself, so she ends up wearing bandages for 3-4 days longer than recommended. Pt verbalizes understanding that daily bandage changes, skin inspection and skin  care are the standard of care  during Intensive Phase CDT. but, since she continues to make volume reduction progress and skin has continued to improve without signs/ symptoms of infection, we compromised and ordered adjustable , half leg CircAids and custom foot pieced bilaterally. She can use these after removing bandages between visits since  they are easier to donn and doff.    4.  With tolerance building strategies over time  Pt will be able to tolerate compression bandages and garments for at least 24 hours  to control BLE lymphedema and to limit progression.  Baseline: Max A Goal status: 05/08/22 GOAL MET for Bandages. ONGOING for CircAids.  5.  With Max caregiver assistance Pt will achieve and sustain no less than 85% compliance with all LE self-care home program components throughout CDT, including modified simple self-MLD, daily skin care and inspection, lymphatic pumping the ex and appropriate compression to limit lymphedema progression and to limit further functional decline. Baseline: Dependent Goal status: 08/15/22 PROGRESSING. Currently diligent w daily skin care and MLD where she is able to reach, 100% by report. But Pt is unable to donn/ doff compression wraps- between visits currently 0% of the time   6.  Pt will diligently perform skin care regime prescribed by her doctor to eliminate fungal infection and prevent reoccurrence for 90 days Baseline: Dependent Goal status: 05/08/22 08/15/22 GOAL MET BY REPORT.   OT PLAN OF CARE:  PT FREQUENCY: 2x/week  PT DURATION: other: 12 weeks and PRN  . 05/08/22 Due to the severity of this case and double limb involvement it is expected that OT for CDT will quite a bit longer THAT INITIAL 12 WEEK POC  PLANNED INTERVENTIONS: Therapeutic exercises, Therapeutic activity, Patient/Family education, Self Care, DME instructions, Manual lymph drainage, Compression bandaging, Vasopneumatic device, Manual therapy, and skin care  PLAN FOR NEXT SESSION:   Skin care MLD FIT CircAids ASAP  Pt/ family edu for LE self-care  Loel Dubonnet, MS, OTR/L, CLT-LANA 08/17/22 2:56 PM

## 2022-08-21 ENCOUNTER — Ambulatory Visit: Payer: Medicare HMO | Admitting: Occupational Therapy

## 2022-08-22 ENCOUNTER — Encounter: Payer: Medicare HMO | Admitting: Occupational Therapy

## 2022-08-22 ENCOUNTER — Ambulatory Visit: Payer: Medicare HMO | Admitting: Podiatry

## 2022-08-23 ENCOUNTER — Encounter: Payer: Medicare HMO | Admitting: Occupational Therapy

## 2022-08-24 ENCOUNTER — Encounter: Payer: Self-pay | Admitting: Occupational Therapy

## 2022-08-24 ENCOUNTER — Ambulatory Visit: Payer: Medicare HMO | Admitting: Occupational Therapy

## 2022-08-24 ENCOUNTER — Encounter: Payer: Medicare HMO | Admitting: Occupational Therapy

## 2022-08-24 DIAGNOSIS — I890 Lymphedema, not elsewhere classified: Secondary | ICD-10-CM | POA: Diagnosis not present

## 2022-08-24 NOTE — Therapy (Signed)
OUTPATIENT OCCUPATIONAL THERAPY TREATMENT NOTE  BILATERAL  LOWER EXTREMITY LYMPHEDEMA  Patient Name: [name] MRN: [id number] DOB:09/16/1965, 57 y.o., female Today's Date: 08/24/2022    END OF SESSION:    OT End of Session - 08/24/22 1601     Visit Number 22    Number of Visits 36    Date for OT Re-Evaluation 09/13/22    OT Start Time 0245    OT Stop Time 0400    OT Time Calculation (min) 75 min    Activity Tolerance Patient tolerated treatment well;No increased pain;Other (comment)   Pt limited by extreme leg swelling   Behavior During Therapy [facility] for tasks assessed/performed             Past Medical History:  Diagnosis Date   Anemia    Anxiety    Arthritis    Asthma    hx as child - no prob as adult - no inhaler   Blood transfusion 01/22/11   transfusion 2 units at [facility]   Depression 08/2010   psych assessment   Dyspnea    Fibroid    Headache(784.0)    rx for imitrex - last one jan   Keloid    Lymphedema    Pulmonary embolism (HCC)    Past Surgical History:  Procedure Laterality Date   ABDOMINAL HYSTERECTOMY     COLONOSCOPY N/A 11/08/2017   Procedure: COLONOSCOPY;  Surgeon: West Bali, MD;  Location: [facility];  Service: Endoscopy;  Laterality: N/A;  2:00pm   FLEXIBLE SIGMOIDOSCOPY N/A 09/17/2017   Procedure: FLEXIBLE SIGMOIDOSCOPY;  Surgeon: West Bali, MD;  Location: [facility];  Service: Endoscopy;  Laterality: N/A;   HERNIA REPAIR  2009   umbilical   POLYPECTOMY  11/08/2017   Procedure: POLYPECTOMY;  Surgeon: West Bali, MD;  Location: [facility];  Service: Endoscopy;;  ascending colon (CSx1), transverse colon (CS x1), splenic flexure (HSx1)   SVD     Spontaneous vaginal delivery; x 1   Patient Active Problem List   Diagnosis Date Noted   Rheumatoid arthritis (HCC) 03/06/2022   Lymphedema 03/06/2022   Morbid obesity (HCC) 09/14/2021   Chest pain 04/07/2021   Screening for malignant neoplasm of colon 03/09/2021    Constipation 03/09/2021   Iron deficiency anemia 03/09/2021   Body mass index (BMI) 45.0-49.9, adult (HCC) 08/20/2020   Chronic fatigue syndrome 08/20/2020   Polyarthralgia 08/20/2020   Vitamin D deficiency 08/20/2020   Cellulitis 12/31/2018   Elephantiasis 12/31/2018   Ischemic chest pain (HCC) 12/31/2018   History of pulmonary embolus (PE) 12/31/2018   Physical deconditioning 08/27/2018   Adenopathy 09/15/2017   Family history of colon cancer 08/01/2017   Taking multiple medications for chronic disease 08/01/2017   Axillary lymphadenopathy 01/26/2017   Urinary incontinence 01/25/2017   Hypokalemia 01/25/2017   Lobar pneumonia (HCC) 01/25/2017   Pressure ulcer of sacral region, stage 2 (HCC) 01/25/2017   Abnormal ECG 01/29/2013   Dyspnea    Menorrhagia with regular cycle 02/22/2011   Anemia 02/22/2011    PCP: Lauren Freud, NP  REFERRING PROVIDER: Geralynn [name], DPM  REFERRING DIAG: I89.0  THERAPY DIAG:  Lymphedema, not elsewhere classified  ONSET DATE: 2018  SUBJECTIVE:  SUBJECTIVE STATEMENT:  [name] presents to Occupational Therapy for lymphedema care to BLE , severe, stage 3, Rheumatoid Lymphedema. Pt is on time for 1 hr  Rx session.  Pt presents with LLE, knee length compression wraps applied at last session in place. Pt reports transportation service found a lymphedema therapist in [place] closer to home, so she will be changing therapists closer to home after 4/7. She prefers to keep last 2 scheduled visits with this OT.   PERTINENT HISTORY:  Severe, stage IV, BLE Lymphedema (Rheumatoid Lymphedema with hyperkeratosis, hyperplasia, hyperpigmentation, papillomatosis, skin breakdown with lymphorrhea, elephantiasis. RA, Lupus, HTN, blood clot disorder with PE x 2 in 2018, RA,  Hx recurrent cellulitis, severe morbid obesity (body mass index > 40.4 kg/m2).RA, deconditioning, hx pressure ulcer sacrum, hx non-pressure leg wound, hx depression hx chronic fatigue, hx keloids PAIN:  Are you having pain? : yes- LE related not rated /10 Pain location: BLE; L foot Pain description: Lupus. Lymphedema: heaviness, tightness, fullness, sore Aggravating: weight bearing, walking, standing Relieving factors: rest, elevation   PRECAUTIONS: Fall, recurrent infections, hx recurrent non-pressure leg wound;    IMPAIRMENTS AND FUNCTIONAL LIMITATIONS: difficulty walking, impaired functional mobility; chronic, progressive limb swelling with associated pain and disfigurement, impaired balance, generalized debility, decreased BLE AROM, decreased strength, impaired endurance, thickened skin limits flexibility  Dependent for Le SELF CARE; impaired basic and instrumental ADLs ( bathing, dressing, grooming (skin inspection, skin and nail care), impaired functional ambulation, transfers and mobility, impaired home management, shopping, unable to cook and prep food, unable to drive, dependent for w[employer] propulsion, unable to work, impaired social participation, impaired body image  PRIOR LEVEL OF FUNCTION: Requires assistive devices and varying levels of assistance with basic and instrumental ADLs, Needs assistance with functional ambulation, all transfers and functional mobility. Unable to work. Housebound due to impaired mobility and relies on others for transportation  PATIENT GOALS: Initial evaluation goal: keep swelling from getting worse so I don't lose my leg. Re-eval gal: 06/14/22: get my legs small enough to be able to walk without a walker.   OBJECTIVE:   OBSERVATIONS /ASSESSMENTS:  FOTO (functional outcomes measure): Intake score 03/01/22 44/100%       Final 08/24/22 54%. 10 pt increase. GOAL MET  Lymphedema Life Impact Scale (LLIS) 61.76% (How much lymphedema related problems interfered  with your life last week)       Final 08/24/22 51.47%. 10.29% pt Decrease. GOAL MET  BLE COMPARATIVE LIMB VOLUMETRICS: 03/14/22 INITIAL   LANDMARK LEFT   R LEG (A-D) 14499.2 ml  R THIGH (E-G) 17195.4 ml  R FULL LIMB (A-G) 31694.6 ml  Limb Volume differential (LVD)  %  Volume change since initial %  Volume change overall %  (Blank rows = not tested)   LLE COMPARATIVE LIMB VOLUMETRICS: 05/09/22 10 th visit  LANDMARK LEFT   L LEG (A-D) 15607.0 ml  L THIGH (E-G)  21709.3 ml  L FULL LIMB (A-G) 37316.4 ml  Limb Volume differential (LVD)  %  Volume change since initial R Leg (A-D) Increased in volume by 7.6% L thigh (E-G) INC by 26.25% L Full limb (E-G) is INC by 17.7%.   Volume change overall %   LLE COMPARATIVE LIMB VOLUMETRICS: 08/15/22  20 th visit: TBA next session 2/2 time constraints  LANDMARK LEFT    L LEG (A-D)  ml  L THIGH (E-G)  ml  L FULL LIMB (A-G) ml  Limb Volume differential (LVD)  %  Volume change since initial  Volume change overall %   TODAY'S TREATMENT:                                                                                                                                         Pt edu and progress review LLE MLD and simultaneous skin care Multilayer knee length compression wraps to L Leg as established. Final FOTO Final LLIS  PATIENT EDUCATION:  Continued Pt edu re progress towards all OT goals to date.  Continued Pt/ CG edu for lymphedema self care home program throughout session. Topics include outcome of comparative limb volumetrics- starting limb volume differentials (LVDs), technology and gradient techniques used for short stretch, multilayer compression wrapping, simple self-MLD, therapeutic lymphatic pumping exercises, skin/nail care, LE precautions,. compression garment recommendations and specifications, wear and care schedule and compression garment donning / doffing w assistive devices. Discussed progress towards all OT goals since  commencing CDT. All questions answered to the Pt's satisfaction.  Person educated: Patient Education method: Explanation, Demonstration, Verbal and Tactile cues, and Handouts Education comprehension: verbalized understanding and returned demonstration  HOME EXERCISE PROGRAM: Daily , multilayer compression bandages to one limb at a time only during Intensive Phase CDT. Daily day and HOS  compression garments during Self-management Phase. Daily skin care w low ph lotion Daily lymphatic pumping there ex, 1 set of 10 each element, in order, bilaterally Daily simple self-MLD  Lymphedema Self- Care Instructions   1. EXERCISE: Perform lymphatic pumping there ex at least 2 x a day while wearing your compression wraps or garments. Perform 10 reps of each exercise bilaterally and be sure to perform them in order. Don't skip around!  OMIT PARTIAL SIT Ups.  2. MLD: Perform simple self-manual lymphatic drainage (MLD) at least once a day as directed. Take your time! Breathe! ;-)  3. If you have a Flexitouch advanced "pump" use it 1 time each day on a single limb only. The Flexitouch moves lymphatic fluid out of your affected body part and back to your heart, so DO NOT use the Flexi on 2 legs at a time, and DO NOT cues it on 2 legs on the same day. If you experience any atypical shortness of breath, sudden onset of pain, or feelings of heart arhythmia, or racing, discontinue use of the Flexitouch and report these symptoms to your doctor right away. Also, discontinue Flexi if you have an infection or a fever. It's OK to resume using the device 72 hours AFTER your first dose of oral antibiotic.   4. 4. WRAPS: Compression wraps are to be worn 23 hrs/ 7 days/wk during Intensive Phase of Complete Decongestive Therapy (CDT).Building tolerance may take time and practice, so don't get discouraged. If bandages begin to feel tight during periods of inactivity and/or during the night, try performing your exercises to  loosen them. Do not leave short stretch wraps in place for > 23 hours. It is  very important that you remove all wraps daily to inspect skin, bathe and perform skin care before reapplying your wraps.  5. Daytime GARMENTS/ HOS DEVICES: During the Self-Management Phase CDT your compression garments are to be worn during waking hours when active. Do NOT sleep in your garments!!  Don daytime garments first thing in the morning. Do not wear your HOS devices all day instead of garments. These will not contain your swelling.  6. PUT YOUR FEET UP! Elevate your feet and legs and feet to the level of your heart whenever you are sitting down.   7. SKIN: Carefully monitor skin condition and perform impeccable hygiene daily. Bathe skin with mild soap and water and apply low pH lotion (aka Eucerin ) to improve hydration and limit infection risk.   ASSESSMENT: CLINICAL IMPRESSION:  Final FOTO functional outcome score is 54%. This 10 pt increase meets and exceeds the goal. Final LLIS score = 51.47%. This 10.29% pt decrease in the degree to which LE-related problems impact daily life meets and exceeds the goal. To date limb volume reduction in the LLE are as follows: R Leg (A-D) Increased in volume by 7.6%. L thigh (E-G) INC by 26.25% L Full limb (E-G) is INC by 17.7%. We'll complete final measurements next session.  Pt tolerated MLD and simultaneous skin care with pain with light palpation over heel. Compression wraps applied as established. Pt encouraged to obtain hand held shower  and shower chair so she can sit while bathing feet and legs thoroughly. Pt has a difficult time reaching distal legs and feet. Consequently dead skin and debris build up is slowing progress and contributing to increased infection risk. These devices would also reduce fall risk significantly. Cont as per POC.  OBJECTIVE IMPAIRMENTS: Abnormal gait, decreased activity tolerance, decreased balance, decreased endurance, decreased knowledge of  condition, decreased knowledge of use of DME, decreased mobility, difficulty walking, decreased ROM, decreased strength, dizziness, hypomobility, increased edema, increased fascial restrictions, impaired flexibility, impaired sensation, postural dysfunction, obesity, pain, and chronic, progressive swelling .   ACTIVITY LIMITATIONS: carrying, lifting, bending, standing, squatting, stairs, transfers, bed mobility, continence, bathing, toileting, dressing, hygiene/grooming, and caring for others  PARTICIPATION LIMITATIONS: meal prep, cleaning, laundry, driving, shopping, community activity, occupation, yard work, school, church, and caring for others  PERSONAL FACTORS: 3+ contributing Co morbidities, limited transportation, consistent, ongoing availability of caregivers, financial limitations are also affecting patient's functional outcome.   REHAB POTENTIAL: Lymphedema management has a high burden of care, especially for Patients who have difficulty, or who are unable to reach their distal legs and feet to apply bandages, compression garments, to bathe, inspect skin and groom nails. In this case because Pt is unable to perform these activities, daily caregiver assistance with all lower extremity lymphedema self-care home program components is essential for achieving clinical success and ongoing self-management. Without consistent, ongoing, daily caregiver assistance for compression bandaging and garments, skin care, and MLD Pt's prognosis is poor. With consistent caregiver assistance with lymphedema home program Pt's prognosis for improved lymphedema control and reducing infection risk is guarded, at best, due to the advanced stage, multiple contributing co-morbidities, and sedentary lifestyle.  EVALUATION COMPLEXITY: High  GOALS: Goals reviewed with patient? YES  SHORT TERM GOALS: Target date: 6th OT visit   Pt will demonstrate understanding of lymphedema precautions and prevention strategies with  modified independence using a printed reference to identify at least 5 precautions and discussing how s/he may implement them into daily life to reduce risk of progression with  modified assistance. Baseline: Max A Goal status: 08/15/22 GOAL MET  2.   With Max caregiver assistance Pt will be able to apply multilayer, knee length, compression wraps using gradient techniques to decrease limb volume, to limit infection risk, and to limit lymphedema progression.  Baseline: Dependent Goal status: 05/08/22 GOAL MET  LONG TERM GOALS: Target date: 05/30/22  Given this patient's Intake score of 44/100% on the functional outcomes FOTO tool, patient will experience an increase in function of 3  points to improve basic and instrumental ADLs performance, including lymphedema self-care. Baseline: 44/100 Goal status: 05/08/22 ONGOING 08/15/22 PROGRESSING  2.  Given this patient's Intake score of TBA % on the Lymphedema Life Impact Scale (LLIS), patient will experience a reduction of at least 5% in her perceived level of functional impairment resulting from lymphedema to improve functional performance and quality of life (QOL). Baseline: TBA Goal status: 05/08/22 ONGOING. 08/15/22 PROGRESSING  3.  With max caregiver assistance Pt will be able to don and doff appropriate compression garments and/or devices to control BLE lymphedema and to limit progression.  Baseline: Dependent Goal status: 05/08/22 ONGOING. Limb volume not reduced enough to date to consider garment measurement and fitting. 08/15/22 Daughter has not assisted Pt with compression wrapping since soon after commencing CDT. Unfortunately, Pt is unable to wrap herself, so she ends up wearing bandages for 3-4 days longer than recommended. Pt verbalizes understanding that daily bandage changes, skin inspection and skin care are the standard of care  during Intensive Phase CDT. but, since she continues to make volume reduction progress and skin has continued to  improve without signs/ symptoms of infection, we compromised and ordered adjustable , half leg CircAids and custom foot pieced bilaterally. She can use these after removing bandages between visits since  they are easier to donn and doff.    4.  With tolerance building strategies over time  Pt will be able to tolerate compression bandages and garments for at least 24 hours  to control BLE lymphedema and to limit progression.  Baseline: Max A Goal status: 05/08/22 GOAL MET for Bandages. ONGOING for CircAids.  5.  With Max caregiver assistance Pt will achieve and sustain no less than 85% compliance with all LE self-care home program components throughout CDT, including modified simple self-MLD, daily skin care and inspection, lymphatic pumping the ex and appropriate compression to limit lymphedema progression and to limit further functional decline. Baseline: Dependent Goal status: 08/15/22 PROGRESSING. Currently diligent w daily skin care and MLD where she is able to reach, 100% by report. But Pt is unable to donn/ doff compression wraps- between visits currently 0% of the time   6.  Pt will diligently perform skin care regime prescribed by her doctor to eliminate fungal infection and prevent reoccurrence for 90 days Baseline: Dependent Goal status: 05/08/22 08/15/22 GOAL MET BY REPORT.   OT PLAN OF CARE:  PT FREQUENCY: 2x/week  PT DURATION: other: 12 weeks and PRN  . 05/08/22 Due to the severity of this case and double limb involvement it is expected that OT for CDT will quite a bit longer THAT INITIAL 12 WEEK POC  PLANNED INTERVENTIONS: Therapeutic exercises, Therapeutic activity, Patient/Family education, Self Care, DME instructions, Manual lymph drainage, Compression bandaging, Vasopneumatic device, Manual therapy, and skin care  PLAN FOR NEXT SESSION:  Skin care MLD FIT CircAids ASAP  Pt/ family edu for LE self-care  Loel Dubonnet, MS, OTR/L, CLT-LANA 08/24/22 4:05 PM

## 2022-08-28 ENCOUNTER — Encounter: Payer: Medicare HMO | Admitting: Occupational Therapy

## 2022-08-28 ENCOUNTER — Encounter: Payer: Self-pay | Admitting: Occupational Therapy

## 2022-08-28 ENCOUNTER — Ambulatory Visit: Payer: Medicare HMO | Attending: Internal Medicine | Admitting: Occupational Therapy

## 2022-08-28 DIAGNOSIS — I890 Lymphedema, not elsewhere classified: Secondary | ICD-10-CM | POA: Insufficient documentation

## 2022-08-28 NOTE — Therapy (Signed)
OUTPATIENT OCCUPATIONAL THERAPY TREATMENT NOTE  BILATERAL  LOWER EXTREMITY LYMPHEDEMA  Patient Name: [name] MRN: [id number] DOB:1965/08/17, 57 y.o., female Today's Date: 08/28/2022    END OF SESSION:    OT End of Session - 08/28/22 1328     Visit Number 23    Number of Visits 36    Date for OT Re-Evaluation 09/13/22    OT Start Time 0100    OT Stop Time 0210    OT Time Calculation (min) 70 min    Activity Tolerance Patient tolerated treatment well;No increased pain;Other (comment)   Pt limited by extreme leg swelling   Behavior During Therapy [facility] for tasks assessed/performed             Past Medical History:  Diagnosis Date   Anemia    Anxiety    Arthritis    Asthma    hx as child - no prob as adult - no inhaler   Blood transfusion 01/22/11   transfusion 2 units at [facility]   Depression 08/2010   psych assessment   Dyspnea    Fibroid    Headache(784.0)    rx for imitrex - last one jan   Keloid    Lymphedema    Pulmonary embolism (HCC)    Past Surgical History:  Procedure Laterality Date   ABDOMINAL HYSTERECTOMY     COLONOSCOPY N/A 11/08/2017   Procedure: COLONOSCOPY;  Surgeon: West Bali, MD;  Location: [facility];  Service: Endoscopy;  Laterality: N/A;  2:00pm   FLEXIBLE SIGMOIDOSCOPY N/A 09/17/2017   Procedure: FLEXIBLE SIGMOIDOSCOPY;  Surgeon: West Bali, MD;  Location: [facility];  Service: Endoscopy;  Laterality: N/A;   HERNIA REPAIR  2009   umbilical   POLYPECTOMY  11/08/2017   Procedure: POLYPECTOMY;  Surgeon: West Bali, MD;  Location: [facility];  Service: Endoscopy;;  ascending colon (CSx1), transverse colon (CS x1), splenic flexure (HSx1)   SVD     Spontaneous vaginal delivery; x 1   Patient Active Problem List   Diagnosis Date Noted   Rheumatoid arthritis (HCC) 03/06/2022   Lymphedema 03/06/2022   Morbid obesity (HCC) 09/14/2021   Chest pain 04/07/2021   Screening for malignant neoplasm of colon 03/09/2021    Constipation 03/09/2021   Iron deficiency anemia 03/09/2021   Body mass index (BMI) 45.0-49.9, adult (HCC) 08/20/2020   Chronic fatigue syndrome 08/20/2020   Polyarthralgia 08/20/2020   Vitamin D deficiency 08/20/2020   Cellulitis 12/31/2018   Elephantiasis 12/31/2018   Ischemic chest pain (HCC) 12/31/2018   History of pulmonary embolus (PE) 12/31/2018   Physical deconditioning 08/27/2018   Adenopathy 09/15/2017   Family history of colon cancer 08/01/2017   Taking multiple medications for chronic disease 08/01/2017   Axillary lymphadenopathy 01/26/2017   Urinary incontinence 01/25/2017   Hypokalemia 01/25/2017   Lobar pneumonia (HCC) 01/25/2017   Pressure ulcer of sacral region, stage 2 (HCC) 01/25/2017   Abnormal ECG 01/29/2013   Dyspnea    Menorrhagia with regular cycle 02/22/2011   Anemia 02/22/2011    PCP: Loreen Freud, NP  REFERRING PROVIDER: Geralynn Rile, DPM  REFERRING DIAG: I89.0  THERAPY DIAG:  Lymphedema, not elsewhere classified  ONSET DATE: 2018  SUBJECTIVE:  SUBJECTIVE STATEMENT:  [name] presents to Occupational Therapy for lymphedema care to BLE , severe, stage 3, Rheumatoid Lymphedema. Pt is on time for 1 hr  Rx session.  Pt presents with LLE, knee length compression wraps applied at last session in place. Pt reports transportation service found a lymphedema therapist in [place] closer to home, so she will be changing therapists closer to home after 4/7. She prefers to keep last 2 scheduled visits with this OT.   PERTINENT HISTORY:  Severe, stage IV, BLE Lymphedema (Rheumatoid Lymphedema with hyperkeratosis, hyperplasia, hyperpigmentation, papillomatosis, skin breakdown with lymphorrhea, elephantiasis. RA, Lupus, HTN, blood clot disorder with PE x 2 in 2018, RA,  Hx recurrent cellulitis, severe morbid obesity (body mass index > 40.4 kg/m2).RA, deconditioning, hx pressure ulcer sacrum, hx non-pressure leg wound, hx depression hx chronic fatigue, hx keloids PAIN:  Are you having pain? : yes- LE related not rated /10 Pain location: BLE; L foot Pain description: Lupus. Lymphedema: heaviness, tightness, fullness, sore Aggravating: weight bearing, walking, standing Relieving factors: rest, elevation   PRECAUTIONS: Fall, recurrent infections, hx recurrent non-pressure leg wound;    IMPAIRMENTS AND FUNCTIONAL LIMITATIONS: difficulty walking, impaired functional mobility; chronic, progressive limb swelling with associated pain and disfigurement, impaired balance, generalized debility, decreased BLE AROM, decreased strength, impaired endurance, thickened skin limits flexibility  Dependent for Le SELF CARE; impaired basic and instrumental ADLs ( bathing, dressing, grooming (skin inspection, skin and nail care), impaired functional ambulation, transfers and mobility, impaired home management, shopping, unable to cook and prep food, unable to drive, dependent for [employer] propulsion, unable to work, impaired social participation, impaired body image  PRIOR LEVEL OF FUNCTION: Requires assistive devices and varying levels of assistance with basic and instrumental ADLs, Needs assistance with functional ambulation, all transfers and functional mobility. Unable to work. Housebound due to impaired mobility and relies on others for transportation  PATIENT GOALS: Initial evaluation goal: keep swelling from getting worse so I don't lose my leg. Re-eval gal: 06/14/22: get my legs small enough to be able to walk without a walker.   OBJECTIVE:   OBSERVATIONS /ASSESSMENTS:  FOTO (functional outcomes measure): Intake score 03/01/22 44/100%       Final 08/24/22 54%. 10 pt increase. GOAL MET  Lymphedema Life Impact Scale (LLIS) 61.76% (How much lymphedema related problems interfered  with your life last week)       Final 08/24/22 51.47%. 10.29% pt Decrease. GOAL MET  LLE COMPARATIVE LIMB VOLUMETRICS: 03/14/22 INITIAL   LANDMARK LEFT   LLEG (A-D) 14499.2 ml  L THIGH (E-G) 17195.4 ml  L FULL LIMB (A-G) 31694.6 ml  Limb Volume differential (LVD)  %  Volume change since initial %  Volume change overall %  (Blank rows = not tested)   LLE COMPARATIVE LIMB VOLUMETRICS: 05/09/22 10 th visit  LANDMARK LEFT   L LEG (A-D) 15607.0 ml  L THIGH (E-G)  21709.3 ml  L FULL LIMB (A-G) 37316.4 ml  Limb Volume differential (LVD)  %  Volume change since initial LLeg (A-D) Increased in volume by 7.6% L thigh (E-G) INC by 26.25% L Full limb (E-G) is INC by 17.7%.   Volume change overall %   LLE COMPARATIVE LIMB VOLUMETRICS: 08/28/22  visit 23:   LANDMARK LEFT    L LEG (A-D) 10390.7 ml  L THIGH (E-G) 17752.5 ml  L FULL LIMB (A-G) 28143.2 ml  Limb Volume differential (LVD)  %  Volume change since last measured on 05/09/22 L Leg (A-D) DECreased in volume  by 33.4 %, L thigh (E-G) DECreased by 18.2 %. L Full limb (E-G) is DECreased  by 24.69 %.    Volume change overall %   TODAY'S TREATMENT:                                                                                                                                         Pt edu and progress review LLE comparative limb volumetrics Multilayer knee length compression wraps to L Leg as established.  PATIENT EDUCATION:  Continued Pt edu re progress towards all OT goals to date.  Continued Pt/ CG edu for lymphedema self care home program throughout session. Topics include outcome of comparative limb volumetrics- starting limb volume differentials (LVDs), technology and gradient techniques used for short stretch, multilayer compression wrapping, simple self-MLD, therapeutic lymphatic pumping exercises, skin/nail care, LE precautions,. compression garment recommendations and specifications, wear and care schedule and compression garment  donning / doffing w assistive devices. Discussed progress towards all OT goals since commencing CDT. All questions answered to the Pt's satisfaction.  Person educated: Patient Education method: Explanation, Demonstration, Verbal and Tactile cues, and Handouts Education comprehension: verbalized understanding and returned demonstration  HOME EXERCISE PROGRAM: Daily , multilayer compression bandages to one limb at a time only during Intensive Phase CDT. Daily day and HOS  compression garments during Self-management Phase. Daily skin care w low ph lotion Daily lymphatic pumping there ex, 1 set of 10 each element, in order, bilaterally Daily simple self-MLD  Lymphedema Self- Care Instructions   1. EXERCISE: Perform lymphatic pumping there ex at least 2 x a day while wearing your compression wraps or garments. Perform 10 reps of each exercise bilaterally and be sure to perform them in order. Don't skip around!  OMIT PARTIAL SIT Ups.  2. MLD: Perform simple self-manual lymphatic drainage (MLD) at least once a day as directed. Take your time! Breathe! ;-)  3. If you have a Flexitouch advanced "pump" use it 1 time each day on a single limb only. The Flexitouch moves lymphatic fluid out of your affected body part and back to your heart, so DO NOT use the Flexi on 2 legs at a time, and DO NOT cues it on 2 legs on the same day. If you experience any atypical shortness of breath, sudden onset of pain, or feelings of heart arhythmia, or racing, discontinue use of the Flexitouch and report these symptoms to your doctor right away. Also, discontinue Flexi if you have an infection or a fever. It's OK to resume using the device 72 hours AFTER your first dose of oral antibiotic.   4. 4. WRAPS: Compression wraps are to be worn 23 hrs/ 7 days/wk during Intensive Phase of Complete Decongestive Therapy (CDT).Building tolerance may take time and practice, so don't get discouraged. If bandages begin to feel tight during  periods of inactivity and/or during the night, try performing your exercises  to loosen them. Do not leave short stretch wraps in place for > 23 hours. It is very important that you remove all wraps daily to inspect skin, bathe and perform skin care before reapplying your wraps.  5. Daytime GARMENTS/ HOS DEVICES: During the Self-Management Phase CDT your compression garments are to be worn during waking hours when active. Do NOT sleep in your garments!!  Don daytime garments first thing in the morning. Do not wear your HOS devices all day instead of garments. These will not contain your swelling.  6. PUT YOUR FEET UP! Elevate your feet and legs and feet to the level of your heart whenever you are sitting down.   7. SKIN: Carefully monitor skin condition and perform impeccable hygiene daily. Bathe skin with mild soap and water and apply low pH lotion (aka Eucerin ) to improve hydration and limit infection risk.   ASSESSMENT: CLINICAL IMPRESSION:  Comparative limb volumetrics revealdramatic Decreases in limb volume for leg, thigh and overall limb volume. All values meat goal range.  Volume change since last measured on 05/09/22 L Leg (A-D) DECreased in volume by 33.4 %, L thigh (E-G) DECreased by 18.2 %. L Full limb (E-G) is DECreased  by 24.69 %.      Compression wraps applied as established. Pt encouraged to obtain hand held shower  and shower chair so she can sit while bathing feet and legs thoroughly. Pt has a difficult time reaching distal legs and feet. Consequently dead skin and debris build up is slowing progress and contributing to increased infection risk. These devices would also reduce fall risk significantly. Cont as per POC.  OBJECTIVE IMPAIRMENTS: Abnormal gait, decreased activity tolerance, decreased balance, decreased endurance, decreased knowledge of condition, decreased knowledge of use of DME, decreased mobility, difficulty walking, decreased ROM, decreased strength, dizziness,  hypomobility, increased edema, increased fascial restrictions, impaired flexibility, impaired sensation, postural dysfunction, obesity, pain, and chronic, progressive swelling .   ACTIVITY LIMITATIONS: carrying, lifting, bending, standing, squatting, stairs, transfers, bed mobility, continence, bathing, toileting, dressing, hygiene/grooming, and caring for others  PARTICIPATION LIMITATIONS: meal prep, cleaning, laundry, driving, shopping, community activity, occupation, yard work, school, church, and caring for others  PERSONAL FACTORS: 3+ contributing Co morbidities, limited transportation, consistent, ongoing availability of caregivers, financial limitations are also affecting patient's functional outcome.   REHAB POTENTIAL: Lymphedema management has a high burden of care, especially for Patients who have difficulty, or who are unable to reach their distal legs and feet to apply bandages, compression garments, to bathe, inspect skin and groom nails. In this case because Pt is unable to perform these activities, daily caregiver assistance with all lower extremity lymphedema self-care home program components is essential for achieving clinical success and ongoing self-management. Without consistent, ongoing, daily caregiver assistance for compression bandaging and garments, skin care, and MLD Pt's prognosis is poor. With consistent caregiver assistance with lymphedema home program Pt's prognosis for improved lymphedema control and reducing infection risk is guarded, at best, due to the advanced stage, multiple contributing co-morbidities, and sedentary lifestyle.  EVALUATION COMPLEXITY: High  GOALS: Goals reviewed with patient? YES  SHORT TERM GOALS: Target date: 6th OT visit   Pt will demonstrate understanding of lymphedema precautions and prevention strategies with modified independence using a printed reference to identify at least 5 precautions and discussing how s/he may implement them into  daily life to reduce risk of progression with modified assistance. Baseline: Max A Goal status: 08/15/22 GOAL MET  2.   With Max caregiver assistance  Pt will be able to apply multilayer, knee length, compression wraps using gradient techniques to decrease limb volume, to limit infection risk, and to limit lymphedema progression.  Baseline: Dependent Goal status: 05/08/22 GOAL MET  LONG TERM GOALS: Target date: 05/30/22  Given this patient's Intake score of 44/100% on the functional outcomes FOTO tool, patient will experience an increase in function of 3  points to improve basic and instrumental ADLs performance, including lymphedema self-care. Baseline: 44/100 Goal status: 05/08/22 ONGOING 08/15/22 PROGRESSING  2.  Given this patient's Intake score of TBA % on the Lymphedema Life Impact Scale (LLIS), patient will experience a reduction of at least 5% in her perceived level of functional impairment resulting from lymphedema to improve functional performance and quality of life (QOL). Baseline: TBA Goal status: 05/08/22 ONGOING. 08/15/22 PROGRESSING  3.  With max caregiver assistance Pt will be able to don and doff appropriate compression garments and/or devices to control BLE lymphedema and to limit progression.  Baseline: Dependent Goal status: 05/08/22 ONGOING. Limb volume not reduced enough to date to consider garment measurement and fitting. 08/15/22 Daughter has not assisted Pt with compression wrapping since soon after commencing CDT. Unfortunately, Pt is unable to wrap herself, so she ends up wearing bandages for 3-4 days longer than recommended. Pt verbalizes understanding that daily bandage changes, skin inspection and skin care are the standard of care  during Intensive Phase CDT. but, since she continues to make volume reduction progress and skin has continued to improve without signs/ symptoms of infection, we compromised and ordered adjustable , half leg CircAids and custom foot pieced  bilaterally. She can use these after removing bandages between visits since  they are easier to donn and doff.    4.  With tolerance building strategies over time  Pt will be able to tolerate compression bandages and garments for at least 24 hours  to control BLE lymphedema and to limit progression.  Baseline: Max A Goal status: 05/08/22 GOAL MET for Bandages. ONGOING for CircAids.  5.  With Max caregiver assistance Pt will achieve and sustain no less than 85% compliance with all LE self-care home program components throughout CDT, including modified simple self-MLD, daily skin care and inspection, lymphatic pumping the ex and appropriate compression to limit lymphedema progression and to limit further functional decline. Baseline: Dependent Goal status: 08/15/22 PROGRESSING. Currently diligent w daily skin care and MLD where she is able to reach, 100% by report. But Pt is unable to donn/ doff compression wraps- between visits currently 0% of the time   6.  Pt will diligently perform skin care regime prescribed by her doctor to eliminate fungal infection and prevent reoccurrence for 90 days Baseline: Dependent Goal status: 05/08/22 08/15/22 GOAL MET BY REPORT.   OT PLAN OF CARE:  PT FREQUENCY: 2x/week  PT DURATION: other: 12 weeks and PRN  . 05/08/22 Due to the severity of this case and double limb involvement it is expected that OT for CDT will quite a bit longer THAT INITIAL 12 WEEK POC  PLANNED INTERVENTIONS: Therapeutic exercises, Therapeutic activity, Patient/Family education, Self Care, DME instructions, Manual lymph drainage, Compression bandaging, Vasopneumatic device, Manual therapy, and skin care  PLAN FOR NEXT SESSION:  Skin care MLD FIT CircAids ASAP  Pt/ family edu for LE self-care  Loel Dubonnet, MS, OTR/L, CLT-LANA 08/28/22 2:58 PM

## 2022-08-29 ENCOUNTER — Encounter: Payer: Medicare HMO | Admitting: Occupational Therapy

## 2022-08-29 DIAGNOSIS — M0579 Rheumatoid arthritis with rheumatoid factor of multiple sites without organ or systems involvement: Secondary | ICD-10-CM | POA: Diagnosis not present

## 2022-08-29 DIAGNOSIS — R5382 Chronic fatigue, unspecified: Secondary | ICD-10-CM | POA: Diagnosis not present

## 2022-08-29 DIAGNOSIS — E559 Vitamin D deficiency, unspecified: Secondary | ICD-10-CM | POA: Diagnosis not present

## 2022-08-29 DIAGNOSIS — I890 Lymphedema, not elsewhere classified: Secondary | ICD-10-CM | POA: Diagnosis not present

## 2022-08-29 DIAGNOSIS — Z6841 Body Mass Index (BMI) 40.0 and over, adult: Secondary | ICD-10-CM | POA: Diagnosis not present

## 2022-08-29 DIAGNOSIS — D6861 Antiphospholipid syndrome: Secondary | ICD-10-CM | POA: Diagnosis not present

## 2022-08-30 ENCOUNTER — Ambulatory Visit: Payer: Medicare HMO | Admitting: Occupational Therapy

## 2022-08-30 ENCOUNTER — Encounter: Payer: Medicare HMO | Admitting: Occupational Therapy

## 2022-08-30 DIAGNOSIS — I890 Lymphedema, not elsewhere classified: Secondary | ICD-10-CM

## 2022-09-05 ENCOUNTER — Encounter: Payer: Self-pay | Admitting: Occupational Therapy

## 2022-09-05 ENCOUNTER — Encounter: Payer: Medicare HMO | Admitting: Occupational Therapy

## 2022-09-05 NOTE — Therapy (Signed)
OUTPATIENT OCCUPATIONAL THERAPY TREATMENT NOTE and DISCHARGE SUMMARY  BILATERAL  LOWER EXTREMITY RHEUMATOID LYMPHEDEMA  Patient Name: [name] MRN: [id number] DOB:1966-01-15, 57 y.o., female Today's Date: 09/05/2022    END OF SESSION:    OT End of Session - 09/05/22 1440     Visit Number 24    Number of Visits 36    Date for OT Re-Evaluation 09/13/22    OT Start Time 0100    OT Stop Time 0210    OT Time Calculation (min) 70 min    Activity Tolerance Patient tolerated treatment well;No increased pain;Other (comment)   Pt limited by extreme leg swelling   Behavior During Therapy [facility] for tasks assessed/performed             Past Medical History:  Diagnosis Date   Anemia    Anxiety    Arthritis    Asthma    hx as child - no prob as adult - no inhaler   Blood transfusion 01/22/11   transfusion 2 units at [facility]   Depression 08/2010   psych assessment   Dyspnea    Fibroid    Headache(784.0)    rx for imitrex - last one jan   Keloid    Lymphedema    Pulmonary embolism (HCC)    Past Surgical History:  Procedure Laterality Date   ABDOMINAL HYSTERECTOMY     COLONOSCOPY N/A 11/08/2017   Procedure: COLONOSCOPY;  Surgeon: West Bali, MD;  [facility]: [facility];  Service: Endoscopy;  Laterality: N/A;  2:00pm   FLEXIBLE SIGMOIDOSCOPY N/A 09/17/2017   Procedure: FLEXIBLE SIGMOIDOSCOPY;  Surgeon: West Bali, MD;  Location: [facility];  Service: Endoscopy;  Laterality: N/A;   HERNIA REPAIR  2009   umbilical   POLYPECTOMY  11/08/2017   Procedure: POLYPECTOMY;  Surgeon: West Bali, MD;  Location: [facility];  Service: Endoscopy;;  ascending colon (CSx1), transverse colon (CS x1), splenic flexure (HSx1)   SVD     Spontaneous vaginal delivery; x 1   Patient Active Problem List   Diagnosis Date Noted   Rheumatoid arthritis (HCC) 03/06/2022   Lymphedema 03/06/2022   Morbid obesity (HCC) 09/14/2021   Chest pain 04/07/2021   Screening for malignant  neoplasm of colon 03/09/2021   Constipation 03/09/2021   Iron deficiency anemia 03/09/2021   Body mass index (BMI) 45.0-49.9, adult (HCC) 08/20/2020   Chronic fatigue syndrome 08/20/2020   Polyarthralgia 08/20/2020   Vitamin D deficiency 08/20/2020   Cellulitis 12/31/2018   Elephantiasis 12/31/2018   Ischemic chest pain (HCC) 12/31/2018   History of pulmonary embolus (PE) 12/31/2018   Physical deconditioning 08/27/2018   Adenopathy 09/15/2017   Family history of colon cancer 08/01/2017   Taking multiple medications for chronic disease 08/01/2017   Axillary lymphadenopathy 01/26/2017   Urinary incontinence 01/25/2017   Hypokalemia 01/25/2017   Lobar pneumonia (HCC) 01/25/2017   Pressure ulcer of sacral region, stage 2 (HCC) 01/25/2017   Abnormal ECG 01/29/2013   Dyspnea    Menorrhagia with regular cycle 02/22/2011   Anemia 02/22/2011    PCP: Loreen Freud, NP  REFERRING PROVIDER: Geralynn Rile, DPM  REFERRING DIAG: I89.0  THERAPY DIAG:  Lymphedema, not elsewhere classified  ONSET DATE: 2018  SUBJECTIVE:  SUBJECTIVE STATEMENT:  [name] presents to Occupational Therapy for lymphedema care to BLE , severe, stage 3, Rheumatoid Lymphedema. Pt is on time for 1 hr  Rx session.  Pt presents with LLE, knee length compression wraps applied at last session in place. Per Pt her social worker located a lymphedema therapist in [place] closer to home, so she will be changing therapists closer to home.  PERTINENT HISTORY:  Severe, stage IV, BLE Lymphedema (Rheumatoid Lymphedema with hyperkeratosis, hyperplasia, hyperpigmentation, papillomatosis, skin breakdown with lymphorrhea, elephantiasis. RA, Lupus, HTN, blood clot disorder with PE x 2 in 2018, RA, Hx recurrent cellulitis, severe morbid  obesity (body mass index > 40.4 kg/m2).RA, deconditioning, hx pressure ulcer sacrum, hx non-pressure leg wound, hx depression hx chronic fatigue, hx keloids PAIN:  Are you having pain? : yes- LE related not rated /10 Pain location: BLE; L foot Pain description: Lupus. Lymphedema: heaviness, tightness, fullness, sore Aggravating: weight bearing, walking, standing Relieving factors: rest, elevation   PRECAUTIONS: Fall, recurrent infections, hx recurrent non-pressure leg wound;    IMPAIRMENTS AND FUNCTIONAL LIMITATIONS: difficulty walking, impaired functional mobility; chronic, progressive limb swelling with associated pain and disfigurement, impaired balance, generalized debility, decreased BLE AROM, decreased strength, impaired endurance, thickened skin limits flexibility  Dependent for Le SELF CARE; impaired basic and instrumental ADLs ( bathing, dressing, grooming (skin inspection, skin and nail care), impaired functional ambulation, transfers and mobility, impaired home management, shopping, unable to cook and prep food, unable to drive, dependent for [employer]ulsion, unable to work, impaired social participation, impaired body image  PRIOR LEVEL OF FUNCTION: Requires assistive devices and varying levels of assistance with basic and instrumental ADLs, Needs assistance with functional ambulation, all transfers and functional mobility. Unable to work. Housebound due to impaired mobility and relies on others for transportation  PATIENT GOALS: Initial evaluation goal: keep swelling from getting worse so I don't lose my leg. Re-eval gal: 06/14/22: get my legs small enough to be able to walk without a walker.   OBJECTIVE:   OBSERVATIONS /ASSESSMENTS:  FOTO (functional outcomes measure): Intake score 03/01/22 44/100%       Final 08/24/22 54%. 10 pt increase. GOAL MET  Lymphedema Life Impact Scale (LLIS) 61.76% (How much lymphedema related problems interfered with your life last week)       Final  08/24/22 51.47%. 10.29% pt Decrease. GOAL MET  LLE COMPARATIVE LIMB VOLUMETRICS: 03/14/22 INITIAL   LANDMARK LEFT   LLE (A-D) 14499.2 ml  L THIGH (E-G) 17195.4 ml  L FULL LIMB (A-G) 31694.6 ml  Limb Volume differential (LVD)  %  Volume change since initial %  Volume change overall %  (Blank rows = not tested)   LLE COMPARATIVE LIMB VOLUMETRICS: 05/09/22 10 th visit  LANDMARK LEFT   L LEG (A-D) 15607.0 ml  L THIGH (E-G)  21709.3 ml  L FULL LIMB (A-G) 37316.4 ml  Limb Volume differential (LVD)  %  Volume change since initial Leg (A-D) Increased in volume by 7.6% L thigh (E-G) INC by 26.25% L Full limb (E-G) is INC by 17.7%.   Volume change overall %   LLE COMPARATIVE LIMB VOLUMETRICS: 08/28/22  visit 23:   LANDMARK LEFT    L LEG (A-D) 10390.7 ml  L THIGH (E-G) 17752.5 ml  L FULL LIMB (A-G) 28143.2 ml  Limb Volume differential (LVD)  %  Volume change since last measured on 05/09/22 L Leg (A-D) Decreased in volume by 33.4 %, L thigh (E-G) Decreased by 18.2 %. L Full limb (  E-G) is Decreased  by 24.69 %.    Volume change overall %   TODAY'S TREATMENT:                                                                                                                                         Pt edu and progress review LLE comparative limb volumetrics Multilayer knee length compression wraps to L Leg as established.  PATIENT EDUCATION:  Continued Pt edu re progress towards all OT goals to date.  Continued Pt/ CG edu for lymphedema self care home program throughout session. Topics include outcome of comparative limb volumetrics- starting limb volume differentials (LVDs), technology and gradient techniques used for short stretch, multilayer compression wrapping, simple self-MLD, therapeutic lymphatic pumping exercises, skin/nail care, LE precautions,. compression garment recommendations and specifications, wear and care schedule and compression garment donning / doffing w assistive devices.  Discussed progress towards all OT goals since commencing CDT. All questions answered to the Pt's satisfaction.  Person educated: Patient Education method: Explanation, Demonstration, Verbal and Tactile cues, and Handouts Education comprehension: verbalized understanding and returned demonstration  HOME EXERCISE PROGRAM: Daily , multilayer compression bandages to one limb at a time only during Intensive Phase CDT. Daily day and HOS  compression garments during Self-management Phase. Daily skin care w low ph lotion Daily lymphatic pumping there ex, 1 set of 10 each element, in order, bilaterally Daily simple self-MLD  Lymphedema Self- Care Instructions   1. EXERCISE: Perform lymphatic pumping there ex at least 2 x a day while wearing your compression wraps or garments. Perform 10 reps of each exercise bilaterally and be sure to perform them in order. Don't skip around!  OMIT PARTIAL SIT Ups.  2. MLD: Perform simple self-manual lymphatic drainage (MLD) at least once a day as directed. Take your time! Breathe! ;-)  3. If you have a Flexitouch advanced "pump" use it 1 time each day on a single limb only. The Flexitouch moves lymphatic fluid out of your affected body part and back to your heart, so DO NOT use the Flexi on 2 legs at a time, and DO NOT cues it on 2 legs on the same day. If you experience any atypical shortness of breath, sudden onset of pain, or feelings of heart arhythmia, or racing, discontinue use of the Flexitouch and report these symptoms to your doctor right away. Also, discontinue Flexi if you have an infection or a fever. It's OK to resume using the device 72 hours AFTER your first dose of oral antibiotic.   4. 4. WRAPS: Compression wraps are to be worn 23 hrs/ 7 days/wk during Intensive Phase of Complete Decongestive Therapy (CDT).Building tolerance may take time and practice, so don't get discouraged. If bandages begin to feel tight during periods of inactivity and/or during  the night, try performing your exercises to loosen them. Do not leave short stretch wraps in place for >  23 hours. It is very important that you remove all wraps daily to inspect skin, bathe and perform skin care before reapplying your wraps.  5. Daytime GARMENTS/ HOS DEVICES: During the Self-Management Phase CDT your compression garments are to be worn during waking hours when active. Do NOT sleep in your garments!!  Don daytime garments first thing in the morning. Do not wear your HOS devices all day instead of garments. These will not contain your swelling.  6. PUT YOUR FEET UP! Elevate your feet and legs and feet to the level of your heart whenever you are sitting down.   7. SKIN: Carefully monitor skin condition and perform impeccable hygiene daily. Bathe skin with mild soap and water and apply low pH lotion (aka Eucerin ) to improve hydration and limit infection risk.   ASSESSMENT: CLINICAL IMPRESSION:  Today is Pt's last scheduled OT visit for lymphedema care at Outpatient Rehab at [facility]. Pt will be attending clinic closer to home to save transportation costs per plan devised by social work. Final LLE Comparative limb volumetrics reveal dramatic decreases in LEFT lower extremity limb volume to date, including leg, thigh and full limb. All LLE values are within goal range. Volume change since last measured on 05/09/22. L Leg (A-D) Decreased in volume by 33.4 %, L thigh (E-G) Decreased by 18.2 %. L Full limb (E-G) is Decreased  by 24.69 %. Total volume lost in L leg below the knee measured 10 liters to date. Skin condition continues to improve gradually with Pt diligently performing that when she removed compression wraps. We were in the process of assisting Pt with co-pay for adjustable CircAid leggings to be used between sessions ions. The CircAid reduction kit is too flimsily a fabric for optimal containment in this case. Unfortunately this project was not completed before Pt left [facility] clinic.  Please refer to GOALS section for all progress to date. Ki Sabella is discharged for OT for lymphedema care this date. I wish her the best of luck with her care in the future.  OBJECTIVE IMPAIRMENTS: Abnormal gait, decreased activity tolerance, decreased balance, decreased endurance, decreased knowledge of condition, decreased knowledge of use of DME, decreased mobility, difficulty walking, decreased ROM, decreased strength, dizziness, hypomobility, increased edema, increased fascial restrictions, impaired flexibility, impaired sensation, postural dysfunction, obesity, pain, and chronic, progressive swelling .   ACTIVITY LIMITATIONS: carrying, lifting, bending, standing, squatting, stairs, transfers, bed mobility, continence, bathing, toileting, dressing, hygiene/grooming, and caring for others  PARTICIPATION LIMITATIONS: meal prep, cleaning, laundry, driving, shopping, community activity, occupation, yard work, school, church, and caring for others  PERSONAL FACTORS: 3+ contributing Co morbidities, limited transportation, consistent, ongoing availability of caregivers, financial limitations are also affecting patient's functional outcome.   REHAB POTENTIAL: Lymphedema management has a high burden of care, especially for Patients who have difficulty, or who are unable to reach their distal legs and feet to apply bandages, compression garments, to bathe, inspect skin and groom nails. In this case because Pt is unable to perform these activities, daily caregiver assistance with all lower extremity lymphedema self-care home program components is essential for achieving clinical success and ongoing self-management. Without consistent, ongoing, daily caregiver assistance for compression bandaging and garments, skin care, and MLD Pt's prognosis is poor. With consistent caregiver assistance with lymphedema home program Pt's prognosis for improved lymphedema control and reducing infection risk is guarded, at best,  due to the advanced stage, multiple contributing co-morbidities, and sedentary lifestyle.  EVALUATION COMPLEXITY: High  GOALS: Goals reviewed with patient?  YES  SHORT TERM GOALS: Target date: 6th OT visit   Pt will demonstrate understanding of lymphedema precautions and prevention strategies with modified independence using a printed reference to identify at least 5 precautions and discussing how s/he may implement them into daily life to reduce risk of progression with modified assistance. Baseline: Max A Goal status: 08/15/22 GOAL MET  2.   With Max caregiver assistance Pt will be able to apply multilayer, knee length, compression wraps using gradient techniques to decrease limb volume, to limit infection risk, and to limit lymphedema progression.  Baseline: Dependent Goal status: 05/08/22 GOAL MET  LONG TERM GOALS: Target date: 05/30/22  Given this patient's Intake score of 44/100% on the functional outcomes FOTO tool, patient will experience an increase in function of 3  points to improve basic and instrumental ADLs performance, including lymphedema self-care. Baseline: 44/100 Goal status: 05/08/22 ONGOING 08/15/22 PROGRESSING. 08/28/22 GOAL MET with final score of 54 and 1 a 10 pt increase in function.  2.  Given this patient's Intake score of TBA % on the Lymphedema Life Impact Scale (LLIS), patient will experience a reduction of at least 5% in her perceived level of functional impairment resulting from lymphedema to improve functional performance and quality of life (QOL). Baseline: TBA Goal status: 05/08/22 ONGOING. 08/15/22 PROGRESSING 08/28/22 GOAL MET with 10.29% reduction in final score of 51.47  3.  With max caregiver assistance Pt will be able to don and doff appropriate compression garments and/or devices to control BLE lymphedema and to limit progression.  Baseline: Dependent Goal status: 05/08/22 ONGOING. Limb volume not reduced enough to date to consider garment measurement and  fitting. 08/15/22 Daughter has not assisted Pt with compression wrapping since soon after commencing CDT. Unfortunately, Pt is unable to wrap herself, so she ends up wearing bandages for 3-4 days longer than recommended. Pt verbalizes understanding that daily bandage changes, skin inspection and skin care are the standard of care  during Intensive Phase CDT. but, since she continues to make volume reduction progress and skin has continued to improve without signs/ symptoms of infection, we compromised and ordered adjustable , half leg CircAids and custom foot pieced bilaterally. She can use these after removing bandages between visits since  they are easier to donn and doff.    4.  With tolerance building strategies over time  Pt will be able to tolerate compression bandages and garments for at least 24 hours  to control BLE lymphedema and to limit progression.  Baseline: Max A Goal status: 05/08/22 GOAL MET for Bandages. ONGOING for CircAids.  5.  With Max caregiver assistance Pt will achieve and sustain no less than 85% compliance with all LE self-care home program components throughout CDT, including modified simple self-MLD, daily skin care and inspection, lymphatic pumping the ex and appropriate compression to limit lymphedema progression and to limit further functional decline. Baseline: Dependent Goal status: 08/15/22 PROGRESSING. Currently diligent w daily skin care and MLD where she is able to reach, 100% by report. But Pt is unable to donn/ doff compression wraps- between visits currently 0% of the time   6.  Pt will diligently perform skin care regime prescribed by her doctor to eliminate fungal infection and prevent reoccurrence for 90 days Baseline: Dependent Goal status: 05/08/22 08/15/22 GOAL MET BY REPORT.   OT PLAN OF CARE:  OT FREQUENCY: 2x/week  PT DURATION: other: 12 weeks and PRN  . 05/08/22 Due to the severity of this case and double limb involvement it  is expected that OT for CDT  will quite a bit longer THAT INITIAL 12 WEEK POC  PLANNED INTERVENTIONS: Therapeutic exercises, Therapeutic activity, Patient/Family education, Self Care, DME instructions, Manual lymph drainage, Compression bandaging, Vasopneumatic device, Manual therapy, and skin care  PLAN FOR NEXT SESSION:  OT DC this Dare.  Loel Dubonnet, MS, OTR/L, CLT-LANA 09/05/22 2:43 PM

## 2022-09-06 ENCOUNTER — Encounter: Payer: Medicare HMO | Admitting: Occupational Therapy

## 2022-09-06 ENCOUNTER — Ambulatory Visit: Payer: Medicare HMO | Admitting: Occupational Therapy

## 2022-09-06 DIAGNOSIS — I890 Lymphedema, not elsewhere classified: Secondary | ICD-10-CM | POA: Diagnosis not present

## 2022-09-07 ENCOUNTER — Encounter: Payer: Medicare HMO | Admitting: Occupational Therapy

## 2022-09-08 ENCOUNTER — Ambulatory Visit: Payer: Medicare HMO | Admitting: Occupational Therapy

## 2022-09-11 ENCOUNTER — Emergency Department (HOSPITAL_COMMUNITY): Payer: Medicare HMO

## 2022-09-11 ENCOUNTER — Other Ambulatory Visit: Payer: Self-pay

## 2022-09-11 ENCOUNTER — Ambulatory Visit: Payer: Medicare HMO | Admitting: Occupational Therapy

## 2022-09-11 ENCOUNTER — Emergency Department (HOSPITAL_COMMUNITY)
Admission: EM | Admit: 2022-09-11 | Discharge: 2022-09-11 | Disposition: A | Discharge status: Home / Self Care | Payer: Medicare HMO | Attending: Emergency Medicine | Admitting: Emergency Medicine

## 2022-09-11 ENCOUNTER — Ambulatory Visit: Payer: Self-pay | Admitting: *Deleted

## 2022-09-11 DIAGNOSIS — R42 Dizziness and giddiness: Secondary | ICD-10-CM | POA: Insufficient documentation

## 2022-09-11 DIAGNOSIS — I959 Hypotension, unspecified: Secondary | ICD-10-CM | POA: Diagnosis not present

## 2022-09-11 DIAGNOSIS — M25562 Pain in left knee: Secondary | ICD-10-CM | POA: Diagnosis not present

## 2022-09-11 DIAGNOSIS — Z7901 Long term (current) use of anticoagulants: Secondary | ICD-10-CM | POA: Diagnosis not present

## 2022-09-11 DIAGNOSIS — M25552 Pain in left hip: Secondary | ICD-10-CM | POA: Insufficient documentation

## 2022-09-11 DIAGNOSIS — R600 Localized edema: Secondary | ICD-10-CM | POA: Diagnosis not present

## 2022-09-11 DIAGNOSIS — S0990XA Unspecified injury of head, initial encounter: Secondary | ICD-10-CM | POA: Diagnosis not present

## 2022-09-11 DIAGNOSIS — M19032 Primary osteoarthritis, left wrist: Secondary | ICD-10-CM | POA: Diagnosis not present

## 2022-09-11 DIAGNOSIS — W06XXXA Fall from bed, initial encounter: Secondary | ICD-10-CM | POA: Insufficient documentation

## 2022-09-11 DIAGNOSIS — W19XXXA Unspecified fall, initial encounter: Secondary | ICD-10-CM | POA: Diagnosis not present

## 2022-09-11 DIAGNOSIS — Z91040 Latex allergy status: Secondary | ICD-10-CM | POA: Diagnosis not present

## 2022-09-11 DIAGNOSIS — Z743 Need for continuous supervision: Secondary | ICD-10-CM | POA: Diagnosis not present

## 2022-09-11 DIAGNOSIS — M160 Bilateral primary osteoarthritis of hip: Secondary | ICD-10-CM | POA: Diagnosis not present

## 2022-09-11 DIAGNOSIS — R109 Unspecified abdominal pain: Secondary | ICD-10-CM | POA: Diagnosis not present

## 2022-09-11 DIAGNOSIS — M25532 Pain in left wrist: Secondary | ICD-10-CM | POA: Diagnosis not present

## 2022-09-11 DIAGNOSIS — M1712 Unilateral primary osteoarthritis, left knee: Secondary | ICD-10-CM | POA: Diagnosis not present

## 2022-09-11 MED ORDER — HYDROCODONE-ACETAMINOPHEN 5-325 MG PO TABS
1.0000 | ORAL_TABLET | Freq: Once | ORAL | Status: AC
  Administered 2022-09-11: 1 via ORAL
  Filled 2022-09-11: qty 1

## 2022-09-11 MED ORDER — HYDROCODONE-ACETAMINOPHEN 5-325 MG PO TABS: 1.0000 | ORAL_TABLET | Freq: Four times a day (QID) | ORAL | 0 refills | Status: DC | PRN

## 2022-09-11 NOTE — Telephone Encounter (Signed)
  Chief Complaint: fall out of bed this am dizziness Symptoms: fell out of bed approx. 0500 on left side and back. Denies hitting head. Reports felt dizzy room spinning/ lightheaded.  Thought was dreaming and falling. Takes eliquis. No injury but continues to feel dizziness standing  chest soreness comes and goes.   Frequency: this am  Pertinent Negatives: Patient denies chest pain no difficulty breathing no open wounds.  Disposition: [x] ED /[] Urgent Care (no appt availability in office) / [] Appointment(In office/virtual)/ []  [facility]/ [] Home Care/ [] Refused Recommended Disposition /[] [facility]/ []  Follow-up with PCP Additional Notes:   Recommended ED . If sx worsen call 911.     Reason for Disposition  Injury (or injuries) that need emergency care  Answer Assessment - Initial Assessment Questions 1. MECHANISM: "How did the fall happen?"     Room spinning and thought was dreaming and looked up fell out of bed  2. DOMESTIC VIOLENCE AND ELDER ABUSE SCREENING: "Did you fall because someone pushed you or tried to hurt you?" If Yes, ask: "Are you safe now?"     no 3. ONSET: "When did the fall happen?" (e.g., minutes, hours, or days ago)     0500 this am  4. LOCATION: "What part of the body hit the ground?" (e.g., back, buttocks, head, hips, knees, hands, head, stomach)     Left side  5. INJURY: "Did you hurt (injure) yourself when you fell?" If Yes, ask: "What did you injure? Tell me more about this?" (e.g., body area; type of injury; pain severity)"     Did not hit head, bounced when hit floor  6. PAIN: "Is there any pain?" If Yes, ask: "How bad is the pain?" (e.g., Scale 1-10; or mild,  moderate, severe)   - NONE (0): No pain   - MILD (1-3): Doesn't interfere with normal activities    - MODERATE (4-7): Interferes with normal activities or awakens from sleep    - SEVERE (8-10): Excruciating pain, unable to do any normal activities      Difficulty walking , hx  lymphemia in legs  7. SIZE: For cuts, bruises, or swelling, ask: "How large is it?" (e.g., inches or centimeters)      na 8. PREGNANCY: "Is there any chance you are pregnant?" "When was your last menstrual period?"     na 9. OTHER SYMPTOMS: "Do you have any other symptoms?" (e.g., dizziness, fever, weakness; new onset or worsening).      Dizziness , fell out of bed . Chest pain on and off, soreness  10. CAUSE: "What do you think caused the fall (or falling)?" (e.g., tripped, dizzy spell)       Light headed taking eliquis  Protocols used: Falls and [facility]

## 2022-09-11 NOTE — ED Provider Notes (Signed)
EMERGENCY DEPARTMENT AT [facility] Provider Note   CSN: [id number] Arrival date & time: 09/11/22  1656     History  Chief Complaint  Patient presents with   Dizziness   Fall    [name] is a 57 y.o. female.  57 year old female with history of below presents for evaluation.  Patient reports a fall out of her bed this morning.  She said this occurred when she had a dream and then rolled out of the bed.  The fall occurred around 5 AM.  She reports that since the fall she has had persistent discomfort to the left wrist, left hip.  He has been ambulatory after the fall.  She does not think she hit her head.  Patient is on Eliquis.    The history is provided by the patient and medical records.  Fall       Home Medications Prior to Admission medications   Medication Sig Start Date End Date Taking? Authorizing Provider  albuterol (VENTOLIN HFA) 108 (90 Base) MCG/ACT inhaler Inhale 2 puffs into the lungs every 6 (six) hours as needed for wheezing or shortness of breath. 07/12/20   Claiborne Rigg, NP  apixaban (ELIQUIS) 5 MG TABS tablet Take 1 tablet (5 mg total) by mouth 2 (two) times daily. 07/07/22   Claiborne Rigg, NP  CIMZIA 2 X 200 MG/ML PSKT Inject into the skin every 14 (fourteen) days. 11/07/21   [provider]  clotrimazole (CLOTRIMAZOLE ANTI-FUNGAL) 1 % cream Apply 1 Application topically 2 (two) times daily. 03/06/22   Claiborne Rigg, NP  ergocalciferol (VITAMIN D2) 1.25 MG (50000 UT) capsule 1 capsule Orally q week for 30 days    [provider]  famotidine (PEPCID) 40 MG tablet Take 1 tablet (40 mg total) by mouth daily. 11/30/21   Claiborne Rigg, NP  folic acid (FOLVITE) 1 MG tablet Take 1 tablet by mouth once daily 07/26/22   Hoy Register, MD  leflunomide (ARAVA) 20 MG tablet 1 tablet Orally Once a day for 30 days 06/29/22   [provider]  losartan (COZAAR) 25 MG tablet Take 1 tablet (25 mg total) by mouth  daily. 11/09/21   Orbie Pyo, MD  Misc. Devices (BARIATRIC ROLLATOR) MISC Please provide patient with insurance approved bariatric rollator walker. I89.0, R53.81, R26.81 11/05/19   Claiborne Rigg, NP      Allergies    Abatacept, Bee venom, Augmentin [amoxicillin-pot clavulanate], Latex, and Nulytely [peg 3350-kcl-na bicarb-nacl]    Review of Systems   Review of Systems  All other systems reviewed and are negative.   Physical Exam Updated Vital Signs BP (!) 158/89 (BP Location: Right Arm)   Pulse 93   Temp 98 F (36.7 C) (Oral)   Resp 19   Ht 5\' 9"  (1.753 m)   Wt 131.5 kg   LMP 01/31/2011   SpO2 97%   BMI 42.83 kg/m  Physical Exam Vitals and nursing note reviewed.  Constitutional:      General: She is not in acute distress.    Appearance: Normal appearance. She is well-developed.  HENT:     Head: Normocephalic and atraumatic.  Eyes:     Conjunctiva/sclera: Conjunctivae normal.     Pupils: Pupils are equal, round, and reactive to light.  Cardiovascular:     Rate and Rhythm: Normal rate and regular rhythm.     Heart sounds: Normal heart sounds.  Pulmonary:     Effort: Pulmonary effort  is normal. No respiratory distress.     Breath sounds: Normal breath sounds.  Abdominal:     General: There is no distension.     Palpations: Abdomen is soft.     Tenderness: There is no abdominal tenderness.  Musculoskeletal:        General: No deformity. Normal range of motion.     Cervical back: Normal range of motion and neck supple.     Right lower leg: No edema.     Left lower leg: Edema present.     Comments: Left lower extremity with compressive wrap.  Skin:    General: Skin is warm and dry.  Neurological:     General: No focal deficit present.     Mental Status: She is alert and oriented to person, place, and time.     ED Results / Procedures / Treatments   Labs (all labs ordered are listed, but only abnormal results are displayed) Labs Reviewed - No data to  display  EKG None  Radiology No results found.  Procedures Procedures    Medications Ordered in ED Medications  HYDROcodone-acetaminophen (NORCO/VICODIN) 5-325 MG per tablet 1 tablet (has no administration in time range)    ED Course/ Medical Decision Making/ A&P                             Medical Decision Making Amount and/or Complexity of Data Reviewed Radiology: ordered.  Risk Prescription drug management.    Medical Screen Complete  This patient presented to the ED with complaint of fall.  This complaint involves an extensive number of treatment options. The initial differential diagnosis includes, but is not limited to, trauma related to fall  This presentation is: Acute, Self-Limited, Previously Undiagnosed, Uncertain Prognosis, Complicated, Systemic Symptoms, and Threat to Life/Bodily Function  Patient reports fall from her bed early this morning.  She complains of diffuse left-sided discomfort secondary to the fall.  Patient without evidence on exam of significant traumatic injury.  Obtained imaging is also without evidence of significant acute pathology.  Patient reassured by workup.  Patient is feeling improved with administration of pain medicine here in the ED.  She desires discharge home.  Importance of close follow-up is stressed.  Additional history obtained:  External records from outside sources obtained and reviewed including prior ED visits and prior Inpatient records.    Lab Tests:  I ordered and personally interpreted labs.    Imaging Studies ordered:  I ordered imaging studies including CT head, plain films of the left knee, left wrist, left hip I independently visualized and interpreted obtained imaging which showed NAD I agree with the radiologist interpretation.   Medicines ordered:  I ordered medication including Norco for pain Reevaluation of the patient after these medicines showed that the patient: improved    Problem  List / ED Course:  Fall   Reevaluation:  After the interventions noted above, I reevaluated the patient and found that they have: improved   Disposition:  After consideration of the diagnostic results and the patients response to treatment, I feel that the patent would benefit from close outpatient followup.          Final Clinical Impression(s) / ED Diagnoses Final diagnoses:  Fall, initial encounter    Rx / DC Orders ED Discharge Orders          Ordered    HYDROcodone-acetaminophen (NORCO/VICODIN) 5-325 MG tablet  Every 6 hours PRN  09/11/22 1900              Wynetta Fines, MD 09/11/22 1904

## 2022-09-11 NOTE — Discharge Instructions (Signed)
Return for any problem.  ?

## 2022-09-11 NOTE — ED Triage Notes (Signed)
Pt BIBA from home. C/o L sided pain, and dizziness after falling out of bed this AM while having a dream. Pt was on floor for 4 hrs. No head trauma, no LOC. Pt is on Eliquis.   Aox4  BP: 148/84 Hr: 74 SPO2: 98 RA CBG: 97

## 2022-09-11 NOTE — Telephone Encounter (Signed)
Spoke with patient . Verified name & DOB   Advised that we received a call earlier and did not see that she had gone to The ED for evaluation. Patient voiced that she was trying to wait for her daughter to get off work. Patient voiced that her s/s (dizziness  and spinning of the room) have gotten worse. Advised patient to call 911 now and not to wait for daughter. Patient was agreeable.

## 2022-09-12 ENCOUNTER — Encounter: Payer: Medicare HMO | Admitting: Podiatry

## 2022-09-12 ENCOUNTER — Encounter: Payer: Medicare HMO | Admitting: Occupational Therapy

## 2022-09-12 ENCOUNTER — Ambulatory Visit: Payer: Self-pay

## 2022-09-12 NOTE — Patient Instructions (Signed)
Visit Information  Thank you for taking time to visit with me today. Please don't hesitate to contact me if I can be of assistance to you.   Following are the goals we discussed today:   Goals Addressed             This Visit's Progress    To avoid falls and or injury from falls       Care Coordination Interventions: Provided written and verbal education re: potential causes of falls and Fall prevention strategies Reviewed medications and discussed potential side effects of medications such as dizziness and frequent urination Advised patient of importance of notifying provider of falls Assessed for falls since last encounter Advised patient to discuss persistent dizziness  with provider        To better manage lymphedema to bilateral legs/feet       Care Coordination Interventions: Evaluation of current treatment plan related to lymphedema to bilateral legs  and patient's adherence to plan as established by provider Determined patient completed her initial lymphedema treatment visit at [facility] Review of patient status, including review of consultant's reports, relevant laboratory and other test results Reviewed with patient her subsequent lymphedema visits and confirmed patient is using [facility] for transportation to this facility  Discussed with patient her symptoms are improving and she has been able to move her legs more freely with reduced edema noted  Determined patient is self-performing wound care to her legs as directed, her daughter will assist with leg wrapping in between her scheduled visits with the lymphedema clinic Instructed patient to report new symptoms or concerns to her doctor promptly         Our next appointment is by telephone on 10/13/22 at 09:00 AM  Please call the care guide team at [phone] if you need to cancel or reschedule your appointment.   If you are experiencing a Mental Health or Behavioral Health Crisis or need someone to talk to, please  call 1-800-273-TALK (toll free, 24 hour hotline)  Patient verbalizes understanding of instructions and care plan provided today and agrees to view in MyChart. Active MyChart status and patient understanding of how to access instructions and care plan via MyChart confirmed with patient.     Delsa Sale, RN, BSN, CCM Care Management Coordinator [facility] Care Management Direct Phone: [phone]

## 2022-09-12 NOTE — Patient Outreach (Signed)
  Care Coordination   Follow Up Visit Note   09/12/2022 Name: [name] MRN: [id number] DOB: February 18, 1966  [name] is a 57 y.o. year old female who sees Claiborne Rigg, NP for primary care. I spoke with  [name] by phone today.  What matters to the patients health and wellness today?  Patient would like to continue having reduced swelling to her legs with ongoing lymphedema therapy. She will follow up with her PCP for evaluation of persistent dizziness.     Goals Addressed             This Visit's Progress    To avoid falls and or injury from falls       Care Coordination Interventions: Provided written and verbal education re: potential causes of falls and Fall prevention strategies Reviewed medications and discussed potential side effects of medications such as dizziness and frequent urination Advised patient of importance of notifying provider of falls Assessed for falls since last encounter Advised patient to discuss persistent dizziness  with provider        To better manage lymphedema to bilateral legs/feet       Care Coordination Interventions: Evaluation of current treatment plan related to lymphedema to bilateral legs  and patient's adherence to plan as established by provider Determined patient completed her initial lymphedema treatment visit at [facility] Review of patient status, including review of consultant's reports, relevant laboratory and other test results Reviewed with patient her subsequent lymphedema visits and confirmed patient is using [facility] for transportation to this facility  Discussed with patient her symptoms are improving and she has been able to move her legs more freely with reduced edema noted  Determined patient is self-performing wound care to her legs as directed, her daughter will assist with leg wrapping in between her scheduled visits with the lymphedema clinic Instructed patient to report new symptoms or concerns  to her doctor promptly     Interventions Today    Flowsheet Row Most Recent Value  Chronic Disease   Chronic disease during today's visit Other  [lymphedema,  falls]  General Interventions   General Interventions Discussed/Reviewed General Interventions Discussed, General Interventions Reviewed, Doctor Visits, Communication with  Doctor Visits Discussed/Reviewed Doctor Visits Discussed, Doctor Visits Reviewed, Specialist  Communication with Social Work  Enrique Sack Humble]  Education Interventions   Education Provided Provided Education  Provided Verbal Education On When to see the doctor, Other  [lymphedema management]  Safety Interventions   Safety Discussed/Reviewed Home Safety, Fall Risk, Safety Reviewed, Safety Discussed  Home Safety Assistive Devices          SDOH assessments and interventions completed:  No     Care Coordination Interventions:  Yes, provided   Follow up plan: Referral made to [facility] BSW to assist with referral to [facility]  Follow up call scheduled for 10/13/22 @09 :00 AM    Encounter Outcome:  Pt. Visit Completed

## 2022-09-13 ENCOUNTER — Encounter: Payer: Self-pay | Admitting: Podiatry

## 2022-09-13 ENCOUNTER — Encounter: Payer: Medicare HMO | Admitting: Occupational Therapy

## 2022-09-13 ENCOUNTER — Ambulatory Visit (INDEPENDENT_AMBULATORY_CARE_PROVIDER_SITE_OTHER): Payer: Medicare HMO | Admitting: Podiatry

## 2022-09-13 VITALS — BP 131/72

## 2022-09-13 DIAGNOSIS — M79674 Pain in right toe(s): Secondary | ICD-10-CM

## 2022-09-13 DIAGNOSIS — M79675 Pain in left toe(s): Secondary | ICD-10-CM | POA: Diagnosis not present

## 2022-09-13 DIAGNOSIS — B351 Tinea unguium: Secondary | ICD-10-CM

## 2022-09-14 ENCOUNTER — Ambulatory Visit: Payer: Medicare HMO | Admitting: Occupational Therapy

## 2022-09-14 ENCOUNTER — Encounter: Payer: Medicare HMO | Admitting: Occupational Therapy

## 2022-09-14 NOTE — Progress Notes (Signed)
Patient was a no-show for today's scheduled appointment.

## 2022-09-18 ENCOUNTER — Ambulatory Visit: Payer: Medicare HMO | Admitting: Occupational Therapy

## 2022-09-18 NOTE — Progress Notes (Signed)
Subjective:  Patient ID: [name], female    DOB: 1965-08-29,  MRN: [id number]  [name] presents to clinic today for painful elongated mycotic toenails 1-5 bilaterally which are tender when wearing enclosed shoe gear. Pain is relieved with periodic professional debridement. Patient continuing care for lymphedema at [facility] Out[facility] OT at [facility]. She is pleased to inform me of her continued progress with her therapy. Chief Complaint  Patient presents with   Nail Problem    RFC,Referring Provider Claiborne Rigg, NP,lov:1 mo. ago      New problem(s): None.   PCP is Claiborne Rigg, NP.  Allergies  Allergen Reactions   Abatacept Anaphylaxis    Other reaction(s): Anaphylaxis-Streptomycin Clickject Other reaction(s): Anaphylaxis-Streptomycin   Bee Venom Anaphylaxis   Augmentin [Amoxicillin-Pot Clavulanate] Hives, Itching and Other (See Comments)    Did PCN reaction causing immediate rash, facial/tongue/throat swelling, SOB or lightheadedness with hypotension: yes Did PCN reaction causing severe rash involving mucus membranes or skin necrosis: no Has patient had a PCN reaction that required hospitalization: in hospital Has patient had a PCN reaction occurring within the last 10 years: no If all of the above answers are "NO", then may proceed with Cephalosporin use.  Pt reports no recollection of any reactions when taking penicillin in past   Latex Hives and Other (See Comments)    Local reaction (welts). Patient denies any wheezing or other reaction with latex exposure   Nulytely [Peg 3350-Kcl-Na Bicarb-Nacl]     NAUSEA AND VOMITING. MAY TOLERATE LOW VOLUME PREP.    Review of Systems: Negative except as noted in the HPI.  Objective: No changes noted in today's physical examination. Vitals:   09/13/22 1518 09/13/22 1527  BP: (!) 170/83 131/72   [name] is a pleasant 57 y.o. female morbidly obese in NAD. AAO x 3. Vascular  Examination: Capillary refill time immediate b/l. Vascular status intact b/l with palpable DP pulses; faintly palpable PT pulses. Pedal hair absent b/l. No pain with calf compression b/l. Skin temperature gradient WNL b/l. No cyanosis or clubbing noted b/l. No ischemia or gangrene b/l. Lymphedema with elephantiasis present BLE with continued improvement when compared to last visit. No ischemia or gangrene noted b/l LE.   Neurological Examination: Sensation grossly intact b/l with 10 gram monofilament. Vibratory sensation intact b/l.   Dermatological Examination: Pedal skin with normal turgor, texture and tone b/l.  No open wounds. No interdigital macerations.   Toenails 1-5 b/l thick, discolored, elongated with subungual debris and pain on dorsal palpation.   Dressing noted LLE which is clean, dry and intact.  Musculoskeletal Examination: Muscle strength 5/5 to all lower extremity muscle groups bilaterally. Edema decreased to degree patient is able to wear shoes on today's visit. Utilizes rollator for ambulation assistance.  Radiographs: None  Last A1c:      Latest Ref Rng & Units 07/07/2022   11:35 AM 11/30/2021   11:54 AM  Hemoglobin A1C  Hemoglobin-A1c 0.0 - 7.0 % 5.1  5.7    Assessment/Plan: 1. Pain due to onychomycosis of toenails of both feet     -Patient was evaluated and treated. All patient's and/or POA's questions/concerns answered on today's visit. -Patient with continued progress in lymphedema b/l lower extremities at [facility] Outpatient OT at [facility]. -Patient to continue soft, supportive shoe gear daily. -Toenails 1-5 b/l were debrided in length and girth with sterile nail nippers and dremel without iatrogenic bleeding.  -Patient/POA to call should there be question/concern in  the interim.   Return in about 3 months (around 12/14/2022).  Freddie Breech, DPM

## 2022-09-19 ENCOUNTER — Ambulatory Visit: Payer: Self-pay

## 2022-09-19 ENCOUNTER — Encounter: Payer: Medicare HMO | Admitting: Occupational Therapy

## 2022-09-19 NOTE — Patient Outreach (Signed)
  Care Coordination   Follow Up Visit Note   09/19/2022 Name: TEHANI MERSMAN MRN: [id number] DOB: 1965/06/19  [name] is a 57 y.o. year old female who sees Claiborne Rigg, NP for primary care. I spoke with  [name] by phone today.  What matters to the patients health and wellness today?  Resources to obtain furniture in her home    Goals Addressed             This Visit's Progress    COMPLETED: Care Coordination Activities       Care Coordination Interventions: Discussed the patient is in the process of moving and she would like a referral to the [employer] for furniture Advised the patient this SW is unable to place a referral as our program is not contracted with this resource Encouraged the patient to Pension scheme manager to request assistance with obtaining furniture Assessed for other care coordination needs - patient states she is doing well         SDOH assessments and interventions completed:  No     Care Coordination Interventions:  Yes, provided   Interventions Today    Flowsheet Row Most Recent Value  Chronic Disease   Chronic disease during today's visit Other  [Resources for furniture]  General Interventions   General Interventions Discussed/Reviewed General Interventions Reviewed  Education Interventions   Education Provided Provided Education  Provided Verbal Education On Community Resources        Follow up plan: No further intervention required. The patient will remain engaged with RN Care Manager    Encounter Outcome:  Pt. Visit Completed   Bevelyn Ngo, BSW, CDP Social Worker, Certified Dementia Practitioner [facility] Care Management  Care Coordination [phone]

## 2022-09-19 NOTE — Patient Instructions (Signed)
Visit Information  Thank you for taking time to visit with me today. Please don't hesitate to contact me if I can be of assistance to you.   Following are the goals we discussed today:  - Pension scheme manager regarding furniture needs   Your next appointment is by telephone on 8/16 at 9am with RN Care Manager Lawanna Kobus Little  Please call the care guide team at [phone] if you need to cancel or reschedule your appointment.   If you are experiencing a Mental Health or Behavioral Health Crisis or need someone to talk to, please call the Suicide and Crisis Lifeline: 988 go to [facility] Urgent [facility] [address] [place] [phone]) call 911  Patient verbalizes understanding of instructions and care plan provided today and agrees to view in MyChart. Active MyChart status and patient understanding of how to access instructions and care plan via MyChart confirmed with patient.     No further follow up required: Please contact me as needed  Bevelyn Ngo, BSW, CDP Social Worker, Certified Dementia Practitioner [facility] Care Management  Care Coordination [phone]

## 2022-09-20 ENCOUNTER — Encounter: Payer: Medicare HMO | Admitting: Occupational Therapy

## 2022-09-21 ENCOUNTER — Encounter: Payer: Medicare HMO | Admitting: Occupational Therapy

## 2022-09-21 ENCOUNTER — Ambulatory Visit: Payer: Medicare HMO | Admitting: Occupational Therapy

## 2022-09-25 ENCOUNTER — Encounter: Payer: Medicare HMO | Admitting: Occupational Therapy

## 2022-09-26 ENCOUNTER — Encounter: Payer: Medicare HMO | Admitting: Occupational Therapy

## 2022-09-27 ENCOUNTER — Encounter: Payer: Medicare HMO | Admitting: Occupational Therapy

## 2022-09-28 ENCOUNTER — Encounter: Payer: Medicare HMO | Admitting: Occupational Therapy

## 2022-10-02 ENCOUNTER — Encounter: Payer: Medicare HMO | Admitting: Occupational Therapy

## 2022-10-03 ENCOUNTER — Encounter: Payer: Medicare HMO | Admitting: Occupational Therapy

## 2022-10-04 ENCOUNTER — Encounter: Payer: Medicare HMO | Admitting: Occupational Therapy

## 2022-10-04 DIAGNOSIS — I890 Lymphedema, not elsewhere classified: Secondary | ICD-10-CM | POA: Diagnosis not present

## 2022-10-05 ENCOUNTER — Encounter: Payer: Medicare HMO | Admitting: Occupational Therapy

## 2022-10-09 ENCOUNTER — Encounter: Payer: Medicare HMO | Admitting: Occupational Therapy

## 2022-10-10 ENCOUNTER — Emergency Department (HOSPITAL_COMMUNITY): Payer: Medicare HMO

## 2022-10-10 ENCOUNTER — Encounter: Payer: Medicare HMO | Admitting: Occupational Therapy

## 2022-10-10 ENCOUNTER — Emergency Department (HOSPITAL_COMMUNITY)
Admission: EM | Admit: 2022-10-10 | Discharge: 2022-10-10 | Disposition: A | Discharge status: Home / Self Care | Payer: Medicare HMO | Attending: Emergency Medicine | Admitting: Emergency Medicine

## 2022-10-10 ENCOUNTER — Telehealth: Payer: Self-pay

## 2022-10-10 DIAGNOSIS — Z91040 Latex allergy status: Secondary | ICD-10-CM | POA: Insufficient documentation

## 2022-10-10 DIAGNOSIS — Z743 Need for continuous supervision: Secondary | ICD-10-CM | POA: Diagnosis not present

## 2022-10-10 DIAGNOSIS — I10 Essential (primary) hypertension: Secondary | ICD-10-CM | POA: Diagnosis not present

## 2022-10-10 DIAGNOSIS — Z7901 Long term (current) use of anticoagulants: Secondary | ICD-10-CM | POA: Insufficient documentation

## 2022-10-10 DIAGNOSIS — R0789 Other chest pain: Secondary | ICD-10-CM

## 2022-10-10 DIAGNOSIS — I890 Lymphedema, not elsewhere classified: Secondary | ICD-10-CM | POA: Insufficient documentation

## 2022-10-10 DIAGNOSIS — R0989 Other specified symptoms and signs involving the circulatory and respiratory systems: Secondary | ICD-10-CM | POA: Diagnosis not present

## 2022-10-10 DIAGNOSIS — R079 Chest pain, unspecified: Secondary | ICD-10-CM | POA: Diagnosis not present

## 2022-10-10 DIAGNOSIS — I517 Cardiomegaly: Secondary | ICD-10-CM | POA: Diagnosis not present

## 2022-10-10 LAB — COMPREHENSIVE METABOLIC PANEL
ALT: 12 U/L (ref 0–44)
AST: 16 U/L (ref 15–41)
Albumin: 3.4 g/dL — ABNORMAL LOW (ref 3.5–5.0)
Alkaline Phosphatase: 39 U/L (ref 38–126)
Anion gap: 9 (ref 5–15)
BUN: 9 mg/dL (ref 6–20)
CO2: 26 mmol/L (ref 22–32)
Calcium: 9.6 mg/dL (ref 8.9–10.3)
Chloride: 106 mmol/L (ref 98–111)
Creatinine, Ser: 0.92 mg/dL (ref 0.44–1.00)
GFR, Estimated: >60 mL/min (ref >60)
Glucose, Bld: 83 mg/dL (ref 70–99)
Potassium: 3.6 mmol/L (ref 3.5–5.1)
Sodium: 141 mmol/L (ref 135–145)
Total Bilirubin: 1 mg/dL (ref 0.3–1.2)
Total Protein: 6.8 g/dL (ref 6.5–8.1)

## 2022-10-10 LAB — CBC
HCT: 34.3 % — ABNORMAL LOW (ref 36.0–46.0)
Hemoglobin: 10.6 g/dL — ABNORMAL LOW (ref 12.0–15.0)
MCH: 29.4 pg (ref 26.0–34.0)
MCHC: 30.9 g/dL (ref 30.0–36.0)
MCV: 95.3 fL (ref 80.0–100.0)
Platelets: 194 10*3/uL (ref 150–400)
RBC: 3.6 MIL/uL — ABNORMAL LOW (ref 3.87–5.11)
RDW: 14.6 % (ref 11.5–15.5)
WBC: 3.9 10*3/uL — ABNORMAL LOW (ref 4.0–10.5)
nRBC: 0 % (ref 0.0–0.2)

## 2022-10-10 LAB — TROPONIN I (HIGH SENSITIVITY)
Troponin I (High Sensitivity): 5 ng/L (ref <18)
Troponin I (High Sensitivity): 7 ng/L (ref <18)

## 2022-10-10 LAB — LIPASE, BLOOD: Lipase: 25 U/L (ref 11–51)

## 2022-10-10 LAB — D-DIMER, QUANTITATIVE: D-Dimer, Quant: <0.27 ug/mL-FEU (ref 0.00–0.50)

## 2022-10-10 MED ORDER — ASPIRIN 81 MG PO CHEW: 324.0000 mg | CHEWABLE_TABLET | Freq: Once | ORAL | Status: DC

## 2022-10-10 NOTE — Telephone Encounter (Signed)
Copied from CRM [id number]. Topic: General - Inquiry >> Oct 10, 2022 11:27 AM De Blanch wrote: Reason for CRM: [place] from [facility] [employer] is calling to f/u on a certificate of medical necessity.  St[employer] will resend fax today.

## 2022-10-10 NOTE — ED Triage Notes (Signed)
Pt BIB EMS from home for left sided chest pressure since 0400, progressively worsening throughout day, radiates to back. No other symptoms. Took 1 nitroglycerin at home, EMS gave 1 more nitroglycerin with no relief. EMS gave 324 of ASA.

## 2022-10-10 NOTE — ED Notes (Signed)
Pt notes history of pulmonary embolism and DVT, says pain feels similar to previous PE

## 2022-10-10 NOTE — Discharge Instructions (Signed)
As discussed, your evaluation today has been largely reassuring.  But, it is important that you monitor your condition carefully, and do not hesitate to return to the ED if you develop new, or concerning changes in your condition. ? ?Otherwise, please follow-up with your physician for appropriate ongoing care. ? ?

## 2022-10-10 NOTE — ED Provider Notes (Signed)
[place] EMERGENCY DEPARTMENT AT [facility] Provider Note   CSN: [id number] Arrival date & time: 10/10/22  1340     History  Chief Complaint  Patient presents with   Chest Pain    [name] is a 57 y.o. female.  HPI Patient presents with chest pain, tightness.  She notes that this is a similar symptom she experienced during prior PE.  She has been taking her medication as directed, has been in her usual state of health, including ongoing therapy for bilateral lymphedema of lower extremities which are unchanged, until today's onset of pain.  Pain began about 8 hours prior to ED arrival. No other complaints including fever, vomiting.    Home Medications Prior to Admission medications   Medication Sig Start Date End Date Taking? Authorizing Provider  albuterol (VENTOLIN HFA) 108 (90 Base) MCG/ACT inhaler Inhale 2 puffs into the lungs every 6 (six) hours as needed for wheezing or shortness of breath. 07/12/20   Claiborne Rigg, NP  apixaban (ELIQUIS) 5 MG TABS tablet Take 1 tablet (5 mg total) by mouth 2 (two) times daily. 07/07/22   Claiborne Rigg, NP  CIMZIA 2 X 200 MG/ML PSKT Inject into the skin every 14 (fourteen) days. 11/07/21   [provider]  clotrimazole (CLOTRIMAZOLE ANTI-FUNGAL) 1 % cream Apply 1 Application topically 2 (two) times daily. 03/06/22   Claiborne Rigg, NP  ergocalciferol (VITAMIN D2) 1.25 MG (50000 UT) capsule 1 capsule Orally q week for 30 days    [provider]  famotidine (PEPCID) 40 MG tablet Take 1 tablet (40 mg total) by mouth daily. 11/30/21   Claiborne Rigg, NP  folic acid (FOLVITE) 1 MG tablet Take 1 tablet by mouth once daily 07/26/22   Hoy Register, MD  HYDROcodone-acetaminophen (NORCO/VICODIN) 5-325 MG tablet Take 1 tablet by mouth every 6 (six) hours as needed. 09/11/22   Wynetta Fines, MD  leflunomide (ARAVA) 20 MG tablet  06/29/22   [provider]  losartan (COZAAR) 25 MG tablet Take 1 tablet  (25 mg total) by mouth daily. 11/09/21   Orbie Pyo, MD  methotrexate (50 MG/ML) 1 g injection Inject into the vein once.    [provider]  Methotrexate Sodium (METHOTREXATE, PF,) 250 MG/10ML injection Inject 0.8 mg into the muscle.    [provider]  Misc. Devices (BARIATRIC ROLLATOR) MISC Please provide patient with insurance approved bariatric rollator walker. I89.0, R53.81, R26.81 11/05/19   Claiborne Rigg, NP      Allergies    Abatacept, Bee venom, Augmentin [amoxicillin-pot clavulanate], Latex, and Nulytely [peg 3350-kcl-na bicarb-nacl]    Review of Systems   Review of Systems  All other systems reviewed and are negative.   Physical Exam Updated Vital Signs BP 130/75   Pulse 86   Temp (!) 97.5 F (36.4 C) (Oral)   Resp 13   Ht 5\' 9"  (1.753 m)   Wt 131.5 kg   LMP 01/31/2011   SpO2 99%   BMI 42.83 kg/m  Physical Exam Vitals and nursing note reviewed.  Constitutional:      General: She is not in acute distress.    Appearance: She is well-developed.  HENT:     Head: Normocephalic and atraumatic.  Eyes:     Conjunctiva/sclera: Conjunctivae normal.  Cardiovascular:     Rate and Rhythm: Normal rate and regular rhythm.  Pulmonary:     Effort: Pulmonary effort is normal. No respiratory distress.  Breath sounds: Normal breath sounds. No stridor.  Abdominal:     General: There is no distension.  Musculoskeletal:     Comments: Substantial bilateral lymphedema  Skin:    General: Skin is warm and dry.  Neurological:     Mental Status: She is alert and oriented to person, place, and time.     Cranial Nerves: No cranial nerve deficit.  Psychiatric:        Mood and Affect: Mood normal.     ED Results / Procedures / Treatments   Labs (all labs ordered are listed, but only abnormal results are displayed) Labs Reviewed  CBC - Abnormal; Notable for the following components:      Result Value   WBC 3.9 (*)    RBC 3.60 (*)    Hemoglobin 10.6  (*)    HCT 34.3 (*)    All other components within normal limits  COMPREHENSIVE METABOLIC PANEL - Abnormal; Notable for the following components:   Albumin 3.4 (*)    All other components within normal limits  LIPASE, BLOOD  D-DIMER, QUANTITATIVE  CBG MONITORING, ED  TROPONIN I (HIGH SENSITIVITY)  TROPONIN I (HIGH SENSITIVITY)    EKG EKG Interpretation Date/Time:  Tuesday October 10 2022 13:50:04 EDT Ventricular Rate:  86 PR Interval:  205 QRS Duration:  121 QT Interval:  419 QTC Calculation: 502 R Axis:   -66  Text Interpretation: Sinus rhythm Borderline prolonged PR interval Nonspecific IVCD with LAD Left ventricular hypertrophy ST-t wave abnormality Abnormal ECG Confirmed by Gerhard Munch [phone]) on 10/10/2022 2:39:54 PM  Radiology No results found.  Procedures Procedures    Medications Ordered in ED Medications  aspirin chewable tablet 324 mg (324 mg Oral Not Given 10/10/22 1355)    ED Course/ Medical Decision Making/ A&P                                 Medical Decision Making Adult female with multiple medical problems including lymphedema, prior PE, rheumatoid arthritis presents with chest tightness.  Given the duration of symptoms since onset, almost half daily without progression lower suspicion for ACS though this is a possibility.  With history of PE, this is another consideration given her immunomodulatory usage.  However, patient's initial vital signs: Cardiac 85 sinus normal Pulse ox 100% room air normal Are both reassuring.  Amount and/or Complexity of Data Reviewed External Data Reviewed: notes. Labs: ordered. Radiology: ordered and independent interpretation performed. Decision-making details documented in ED Course. ECG/medicine tests: ordered and independent interpretation performed. Decision-making details documented in ED Course.  Risk OTC drugs. Prescription drug management. Decision regarding hospitalization.   3:58 PM Patient in no  distress awake, alert, vital signs unremarkable, D-dimer normal, first troponin normal, little evidence for ACS, PE, particular given the patient's history, and compliance with her anticoagulation regimen. Patient awaiting additional labs, repeat evaluation, though with initial reassurance taken from labs as above, patient is comfortable with discharge should remaining studies be reassuring and she remained hemodynamically unremarkable.         Final Clinical Impression(s) / ED Diagnoses Final diagnoses:  Atypical chest pain    Rx / DC Orders ED Discharge Orders     None         Gerhard Munch, MD 10/10/22 1558

## 2022-10-10 NOTE — ED Provider Notes (Signed)
Patient signed out to me by Dr. Jeraldine Loots pending second troponin which came back negative.  Patient feels at her baseline and will discharge home.   Lorre Nick, MD 10/10/22 1807

## 2022-10-10 NOTE — ED Notes (Signed)
Pt had multiple IV attempts and says normally needs [place] IV for small rolling veins. Will consult IV team if needed for CT or medications

## 2022-10-11 ENCOUNTER — Encounter: Payer: Medicare HMO | Admitting: Occupational Therapy

## 2022-10-11 ENCOUNTER — Encounter: Payer: Self-pay | Admitting: Nurse Practitioner

## 2022-10-11 ENCOUNTER — Ambulatory Visit: Payer: Medicare HMO | Attending: Nurse Practitioner | Admitting: Nurse Practitioner

## 2022-10-11 VITALS — BP 145/78 | HR 95 | Ht 69.0 in | Wt 362.8 lb

## 2022-10-11 DIAGNOSIS — E785 Hyperlipidemia, unspecified: Secondary | ICD-10-CM

## 2022-10-11 DIAGNOSIS — R1013 Epigastric pain: Secondary | ICD-10-CM | POA: Diagnosis not present

## 2022-10-11 DIAGNOSIS — L03113 Cellulitis of right upper limb: Secondary | ICD-10-CM | POA: Diagnosis not present

## 2022-10-11 DIAGNOSIS — R42 Dizziness and giddiness: Secondary | ICD-10-CM | POA: Diagnosis not present

## 2022-10-11 MED ORDER — FAMOTIDINE 40 MG PO TABS
40.0000 mg | ORAL_TABLET | Freq: Every day | ORAL | 1 refills | Status: DC
Start: 2022-10-11 — End: 2023-04-20

## 2022-10-11 MED ORDER — DOXYCYCLINE HYCLATE 100 MG PO TABS
100.0000 mg | ORAL_TABLET | Freq: Two times a day (BID) | ORAL | 0 refills | Status: AC
Start: 2022-10-11 — End: 2022-10-16

## 2022-10-11 MED ORDER — MECLIZINE HCL 12.5 MG PO TABS
12.5000 mg | ORAL_TABLET | Freq: Three times a day (TID) | ORAL | 0 refills | Status: DC | PRN
Start: 2022-10-11 — End: 2024-01-13

## 2022-10-11 MED ORDER — EZETIMIBE 10 MG PO TABS: 10.0000 mg | ORAL_TABLET | Freq: Every day | ORAL | 3 refills | Status: DC

## 2022-10-11 NOTE — Progress Notes (Signed)
Assessment & Plan:  [name] was seen today for insect bite.  Diagnoses and all orders for this visit:  Cellulitis of right upper extremity -     doxycycline (VIBRA-TABS) 100 MG tablet; Take 1 tablet (100 mg total) by mouth 2 (two) times daily for 5 days. For skin infection  Epigastric pain -     famotidine (PEPCID) 40 MG tablet; Take 1 tablet (40 mg total) by mouth daily.  Dizziness -     meclizine (ANTIVERT) 12.5 MG tablet; Take 1-2 tablets (12.5-25 mg total) by mouth 3 (three) times daily as needed for dizziness.  Dyslipidemia, goal LDL below 100 -     ezetimibe (ZETIA) 10 MG tablet; Take 1 tablet (10 mg total) by mouth daily. For high cholesterol    Patient has been counseled on age-appropriate routine health concerns for screening and prevention. These are reviewed and up-to-date. Referrals have been placed accordingly. Immunizations are up-to-date or declined.    Subjective:   Chief Complaint  Patient presents with   Insect Bite   HPI [name] 57 y.o. female presents to office today with concerns of possible skin infection due to insect bite and dizziness.  Notes being bitten by "something" 3 days ago on the right upper arm. The are is itching and erythematous at this time. She states the redness has increased in circumference.   Blood pressure slightly elevated today however she endorses taking losartan 25 mg daily as prescribed. BP Readings from Last 3 Encounters:  10/11/22 (!) 145/78  10/10/22 130/75  09/13/22 131/72     Having dizzy spells. Sometimes even wakes up dizzy. Onset 1 month ago. Usually lasts up to one full day but no longer than 2 days. Unrelated to position changes. Feels like the room is moving.    The 10-year ASCVD risk score (Arnett DK, et al., 2019) is: 13.5%   Values used to calculate the score:     Age: 29 years     Sex: Female     Is Non-Hispanic African American: Yes     Diabetic: Yes     Tobacco smoker: No     Systolic  Blood Pressure: 145 mmHg     Is BP treated: Yes     HDL Cholesterol: 56 mg/dL     Total Cholesterol: 164 mg/dL  Review of Systems  Constitutional:  Negative for fever, malaise/fatigue and weight loss.  HENT: Negative.  Negative for nosebleeds.   Eyes: Negative.  Negative for blurred vision, double vision and photophobia.  Respiratory: Negative.  Negative for cough and shortness of breath.   Cardiovascular:  Positive for leg swelling (chronic lymphedema). Negative for chest pain and palpitations.  Gastrointestinal:  Positive for heartburn. Negative for abdominal pain, blood in stool, constipation, diarrhea, melena, nausea and vomiting.  Musculoskeletal: Negative.  Negative for myalgias.  Neurological:  Positive for dizziness. Negative for focal weakness, seizures and headaches.  Psychiatric/Behavioral: Negative.  Negative for suicidal ideas.     Past Medical History:  Diagnosis Date   Anemia    Anxiety    Arthritis    Asthma    hx as child - no prob as adult - no inhaler   Blood transfusion 01/22/11   transfusion 2 units at [facility]   Depression 08/2010   psych assessment   Dyspnea    Fibroid    Headache(784.0)    rx for imitrex - last one jan   Keloid    Lymphedema    Pulmonary embolism (HCC)  Past Surgical History:  Procedure Laterality Date   ABDOMINAL HYSTERECTOMY     COLONOSCOPY N/A 11/08/2017   Procedure: COLONOSCOPY;  Surgeon: West Bali, MD;  Location: [facility];  Service: Endoscopy;  Laterality: N/A;  2:00pm   FLEXIBLE SIGMOIDOSCOPY N/A 09/17/2017   Procedure: FLEXIBLE SIGMOIDOSCOPY;  Surgeon: West Bali, MD;  Location: [facility];  Service: Endoscopy;  Laterality: N/A;   HERNIA REPAIR  2009   umbilical   POLYPECTOMY  11/08/2017   Procedure: POLYPECTOMY;  Surgeon: West Bali, MD;  Location: [facility];  Service: Endoscopy;;  ascending colon (CSx1), transverse colon (CS x1), splenic flexure (HSx1)   SVD     Spontaneous vaginal delivery; x 1     Family History  Problem Relation Age of Onset   Heart disease Mother    Hypertension Mother    Hypertension Father    Colon cancer Father 77       Passed away 88 yrs old   Hypertension Sister    Cancer Maternal Grandmother        gastric cancer   Cancer Maternal Grandfather        pancreatic cancer   Colon cancer Paternal Grandmother    Colon cancer Paternal Uncle    Colon polyps Neg Hx     Social History Reviewed with no changes to be made today.   Outpatient Medications Prior to Visit  Medication Sig Dispense Refill   albuterol (VENTOLIN HFA) 108 (90 Base) MCG/ACT inhaler Inhale 2 puffs into the lungs every 6 (six) hours as needed for wheezing or shortness of breath. 18 g 2   apixaban (ELIQUIS) 5 MG TABS tablet Take 1 tablet (5 mg total) by mouth 2 (two) times daily. 90 tablet 3   CIMZIA 2 X 200 MG/ML PSKT Inject into the skin every 14 (fourteen) days.     clotrimazole (CLOTRIMAZOLE ANTI-FUNGAL) 1 % cream Apply 1 Application topically 2 (two) times daily. 100 g 1   ergocalciferol (VITAMIN D2) 1.25 MG (50000 UT) capsule 1 capsule Orally q week for 30 days     folic acid (FOLVITE) 1 MG tablet Take 1 tablet by mouth once daily 90 tablet 1   losartan (COZAAR) 25 MG tablet Take 1 tablet (25 mg total) by mouth daily. 90 tablet 3   methotrexate (50 MG/ML) 1 g injection Inject into the vein once.     Methotrexate Sodium (METHOTREXATE, PF,) 250 MG/10ML injection Inject 0.8 mg into the muscle.     Misc. Devices (BARIATRIC ROLLATOR) MISC Please provide patient with insurance approved bariatric rollator walker. I89.0, R53.81, R26.81 1 each 0   famotidine (PEPCID) 40 MG tablet Take 1 tablet (40 mg total) by mouth daily. 90 tablet 1   HYDROcodone-acetaminophen (NORCO/VICODIN) 5-325 MG tablet Take 1 tablet by mouth every 6 (six) hours as needed. (Patient not taking: Reported on 10/11/2022) 8 tablet 0   leflunomide (ARAVA) 20 MG tablet  (Patient not taking: Reported on 10/11/2022)     No  facility-administered medications prior to visit.    Allergies  Allergen Reactions   Abatacept Anaphylaxis    Other reaction(s): Anaphylaxis-Streptomycin Clickject Other reaction(s): Anaphylaxis-Streptomycin   Bee Venom Anaphylaxis   Augmentin [Amoxicillin-Pot Clavulanate] Hives, Itching and Other (See Comments)    Did PCN reaction causing immediate rash, facial/tongue/throat swelling, SOB or lightheadedness with hypotension: yes Did PCN reaction causing severe rash involving mucus membranes or skin necrosis: no Has patient had a PCN reaction that required hospitalization: in hospital Has  patient had a PCN reaction occurring within the last 10 years: no If all of the above answers are "NO", then may proceed with Cephalosporin use.  Pt reports no recollection of any reactions when taking penicillin in past   Latex Hives and Other (See Comments)    Local reaction (welts). Patient denies any wheezing or other reaction with latex exposure   Nulytely [Peg 3350-Kcl-Na Bicarb-Nacl]     NAUSEA AND VOMITING. MAY TOLERATE LOW VOLUME PREP.       Objective:    BP (!) 145/78 (BP Location: Left Arm, Patient Position: Sitting, Cuff Size: Normal)   Pulse 95   Ht 5\' 9"  (1.753 m)   Wt (!) 362 lb 12.8 oz (164.6 kg)   LMP 01/31/2011   SpO2 97%   BMI 53.58 kg/m  Wt Readings from Last 3 Encounters:  10/11/22 (!) 362 lb 12.8 oz (164.6 kg)  10/10/22 290 lb (131.5 kg)  09/11/22 290 lb (131.5 kg)    Physical Exam Vitals and nursing note reviewed.  Constitutional:      Appearance: She is well-developed.  HENT:     Head: Normocephalic and atraumatic.  Cardiovascular:     Rate and Rhythm: Normal rate and regular rhythm.     Heart sounds: Normal heart sounds. No murmur heard.    No friction rub. No gallop.  Pulmonary:     Effort: Pulmonary effort is normal. No tachypnea or respiratory distress.     Breath sounds: Normal breath sounds. No decreased breath sounds, wheezing, rhonchi or rales.   Chest:     Chest wall: No tenderness.  Abdominal:     General: Bowel sounds are normal.     Palpations: Abdomen is soft.  Musculoskeletal:        General: Normal range of motion.     Cervical back: Normal range of motion.  Skin:    General: Skin is warm and dry.  Neurological:     Mental Status: She is alert and oriented to person, place, and time.     Coordination: Coordination normal.  Psychiatric:        Behavior: Behavior normal. Behavior is cooperative.        Thought Content: Thought content normal.        Judgment: Judgment normal.          Patient has been counseled extensively about nutrition and exercise as well as the importance of adherence with medications and regular follow-up. The patient was given clear instructions to go to ER or return to medical center if symptoms don't improve, worsen or new problems develop. The patient verbalized understanding.   Follow-up: Return in about 3 months (around 01/11/2023).   Claiborne Rigg, FNP-BC [facility] [place], [place] [phone]   10/11/2022, 1:47 PM

## 2022-10-11 NOTE — Telephone Encounter (Signed)
No faxes have been received

## 2022-10-11 NOTE — Progress Notes (Signed)
Insect bite to right arm. Noticed Sunday. Dizzy spells

## 2022-10-12 ENCOUNTER — Encounter: Payer: Medicare HMO | Admitting: Occupational Therapy

## 2022-10-13 ENCOUNTER — Ambulatory Visit: Payer: Self-pay

## 2022-10-13 NOTE — Patient Outreach (Signed)
  Care Coordination   10/13/2022 Name: [name] MRN: [id number] DOB: 31-Aug-1965   Care Coordination Outreach Attempts:  An unsuccessful telephone outreach was attempted for a scheduled appointment today.  Follow Up Plan:  Additional outreach attempts will be made to offer the patient care coordination information and services.   Encounter Outcome:  Pt. Request to Call Back   Care Coordination Interventions:  No, not indicated    Delsa Sale, RN, BSN, CCM Care Management Coordinator [facility] Care Management Direct Phone: [phone]

## 2022-10-16 ENCOUNTER — Encounter: Payer: Medicare HMO | Admitting: Occupational Therapy

## 2022-10-17 ENCOUNTER — Encounter: Payer: Medicare HMO | Admitting: Occupational Therapy

## 2022-10-17 DIAGNOSIS — I890 Lymphedema, not elsewhere classified: Secondary | ICD-10-CM | POA: Diagnosis not present

## 2022-10-18 ENCOUNTER — Encounter: Payer: Medicare HMO | Admitting: Occupational Therapy

## 2022-10-19 ENCOUNTER — Encounter: Payer: Medicare HMO | Admitting: Occupational Therapy

## 2022-10-20 ENCOUNTER — Other Ambulatory Visit: Payer: Self-pay | Admitting: Internal Medicine

## 2022-10-23 ENCOUNTER — Encounter: Payer: Medicare HMO | Admitting: Occupational Therapy

## 2022-10-24 ENCOUNTER — Encounter: Payer: Medicare HMO | Admitting: Occupational Therapy

## 2022-10-24 DIAGNOSIS — I890 Lymphedema, not elsewhere classified: Secondary | ICD-10-CM | POA: Diagnosis not present

## 2022-10-24 DIAGNOSIS — E559 Vitamin D deficiency, unspecified: Secondary | ICD-10-CM | POA: Diagnosis not present

## 2022-10-24 DIAGNOSIS — R5382 Chronic fatigue, unspecified: Secondary | ICD-10-CM | POA: Diagnosis not present

## 2022-10-24 DIAGNOSIS — D6861 Antiphospholipid syndrome: Secondary | ICD-10-CM | POA: Diagnosis not present

## 2022-10-24 DIAGNOSIS — M0579 Rheumatoid arthritis with rheumatoid factor of multiple sites without organ or systems involvement: Secondary | ICD-10-CM | POA: Diagnosis not present

## 2022-10-24 DIAGNOSIS — L0390 Cellulitis, unspecified: Secondary | ICD-10-CM | POA: Diagnosis not present

## 2022-10-24 DIAGNOSIS — Z6841 Body Mass Index (BMI) 40.0 and over, adult: Secondary | ICD-10-CM | POA: Diagnosis not present

## 2022-10-25 ENCOUNTER — Ambulatory Visit: Payer: Self-pay

## 2022-10-25 ENCOUNTER — Encounter: Payer: Medicare HMO | Admitting: Occupational Therapy

## 2022-10-25 NOTE — Patient Outreach (Signed)
  Care Coordination   10/25/2022 Name: [name] MRN: [id number] DOB: 26-May-1965   Care Coordination Outreach Attempts:  An unsuccessful telephone outreach was attempted for a scheduled appointment today.  Follow Up Plan:  Additional outreach attempts will be made to offer the patient care coordination information and services.   Encounter Outcome:  No Answer   Care Coordination Interventions:  No, not indicated    Delsa Sale, RN, BSN, CCM Care Management Coordinator [facility] Care Management  Direct Phone: [phone]

## 2022-10-26 ENCOUNTER — Encounter: Payer: Medicare HMO | Admitting: Occupational Therapy

## 2022-10-26 DIAGNOSIS — I890 Lymphedema, not elsewhere classified: Secondary | ICD-10-CM | POA: Diagnosis not present

## 2022-10-31 ENCOUNTER — Encounter: Payer: Medicare HMO | Admitting: Occupational Therapy

## 2022-11-01 ENCOUNTER — Encounter: Payer: Medicare HMO | Admitting: Occupational Therapy

## 2022-11-02 ENCOUNTER — Encounter: Payer: Medicare HMO | Admitting: Occupational Therapy

## 2022-11-02 DIAGNOSIS — H18612 Keratoconus, stable, left eye: Secondary | ICD-10-CM | POA: Diagnosis not present

## 2022-11-06 ENCOUNTER — Other Ambulatory Visit: Payer: Self-pay

## 2022-11-06 ENCOUNTER — Inpatient Hospital Stay (HOSPITAL_COMMUNITY)
Admission: EM | Admit: 2022-11-06 | Discharge: 2022-11-16 | DRG: 872 | Disposition: A | Discharge status: Home / Self Care | Payer: Medicare HMO | Attending: Internal Medicine | Admitting: Internal Medicine

## 2022-11-06 ENCOUNTER — Emergency Department (HOSPITAL_COMMUNITY): Payer: Medicare HMO

## 2022-11-06 ENCOUNTER — Encounter: Payer: Medicare HMO | Admitting: Occupational Therapy

## 2022-11-06 ENCOUNTER — Encounter (HOSPITAL_COMMUNITY): Payer: Self-pay

## 2022-11-06 DIAGNOSIS — L03116 Cellulitis of left lower limb: Secondary | ICD-10-CM | POA: Diagnosis not present

## 2022-11-06 DIAGNOSIS — L0390 Cellulitis, unspecified: Secondary | ICD-10-CM | POA: Diagnosis not present

## 2022-11-06 DIAGNOSIS — I10 Essential (primary) hypertension: Secondary | ICD-10-CM | POA: Diagnosis not present

## 2022-11-06 DIAGNOSIS — I517 Cardiomegaly: Secondary | ICD-10-CM | POA: Diagnosis not present

## 2022-11-06 DIAGNOSIS — I3139 Other pericardial effusion (noninflammatory): Secondary | ICD-10-CM | POA: Diagnosis present

## 2022-11-06 DIAGNOSIS — Z8249 Family history of ischemic heart disease and other diseases of the circulatory system: Secondary | ICD-10-CM

## 2022-11-06 DIAGNOSIS — Z86711 Personal history of pulmonary embolism: Secondary | ICD-10-CM | POA: Diagnosis present

## 2022-11-06 DIAGNOSIS — E7800 Pure hypercholesterolemia, unspecified: Secondary | ICD-10-CM | POA: Diagnosis present

## 2022-11-06 DIAGNOSIS — E860 Dehydration: Secondary | ICD-10-CM | POA: Diagnosis present

## 2022-11-06 DIAGNOSIS — R600 Localized edema: Secondary | ICD-10-CM | POA: Diagnosis not present

## 2022-11-06 DIAGNOSIS — R17 Unspecified jaundice: Secondary | ICD-10-CM | POA: Diagnosis present

## 2022-11-06 DIAGNOSIS — I890 Lymphedema, not elsewhere classified: Secondary | ICD-10-CM

## 2022-11-06 DIAGNOSIS — M069 Rheumatoid arthritis, unspecified: Secondary | ICD-10-CM | POA: Diagnosis present

## 2022-11-06 DIAGNOSIS — R0602 Shortness of breath: Secondary | ICD-10-CM | POA: Diagnosis not present

## 2022-11-06 DIAGNOSIS — N179 Acute kidney failure, unspecified: Secondary | ICD-10-CM | POA: Diagnosis present

## 2022-11-06 DIAGNOSIS — Z91040 Latex allergy status: Secondary | ICD-10-CM

## 2022-11-06 DIAGNOSIS — Z86718 Personal history of other venous thrombosis and embolism: Secondary | ICD-10-CM

## 2022-11-06 DIAGNOSIS — Z1152 Encounter for screening for COVID-19: Secondary | ICD-10-CM

## 2022-11-06 DIAGNOSIS — R6520 Severe sepsis without septic shock: Secondary | ICD-10-CM | POA: Diagnosis present

## 2022-11-06 DIAGNOSIS — Z79899 Other long term (current) drug therapy: Secondary | ICD-10-CM

## 2022-11-06 DIAGNOSIS — M7989 Other specified soft tissue disorders: Secondary | ICD-10-CM | POA: Diagnosis not present

## 2022-11-06 DIAGNOSIS — A419 Sepsis, unspecified organism: Secondary | ICD-10-CM | POA: Diagnosis not present

## 2022-11-06 DIAGNOSIS — M79605 Pain in left leg: Secondary | ICD-10-CM | POA: Diagnosis not present

## 2022-11-06 DIAGNOSIS — M2142 Flat foot [pes planus] (acquired), left foot: Secondary | ICD-10-CM | POA: Diagnosis not present

## 2022-11-06 DIAGNOSIS — K219 Gastro-esophageal reflux disease without esophagitis: Secondary | ICD-10-CM | POA: Diagnosis present

## 2022-11-06 DIAGNOSIS — R9431 Abnormal electrocardiogram [ECG] [EKG]: Secondary | ICD-10-CM | POA: Diagnosis not present

## 2022-11-06 DIAGNOSIS — Z880 Allergy status to penicillin: Secondary | ICD-10-CM

## 2022-11-06 DIAGNOSIS — R011 Cardiac murmur, unspecified: Secondary | ICD-10-CM | POA: Diagnosis present

## 2022-11-06 DIAGNOSIS — Z6841 Body Mass Index (BMI) 40.0 and over, adult: Secondary | ICD-10-CM

## 2022-11-06 DIAGNOSIS — Z90710 Acquired absence of both cervix and uterus: Secondary | ICD-10-CM

## 2022-11-06 DIAGNOSIS — Z91030 Bee allergy status: Secondary | ICD-10-CM

## 2022-11-06 DIAGNOSIS — R5383 Other fatigue: Secondary | ICD-10-CM | POA: Diagnosis not present

## 2022-11-06 DIAGNOSIS — M7732 Calcaneal spur, left foot: Secondary | ICD-10-CM | POA: Diagnosis not present

## 2022-11-06 DIAGNOSIS — Z888 Allergy status to other drugs, medicaments and biological substances status: Secondary | ICD-10-CM

## 2022-11-06 DIAGNOSIS — Z800 Family history of malignant neoplasm of digestive organs: Secondary | ICD-10-CM

## 2022-11-06 DIAGNOSIS — I444 Left anterior fascicular block: Secondary | ICD-10-CM | POA: Diagnosis present

## 2022-11-06 DIAGNOSIS — Z7901 Long term (current) use of anticoagulants: Secondary | ICD-10-CM

## 2022-11-06 LAB — COMPREHENSIVE METABOLIC PANEL
ALT: 21 U/L (ref 0–44)
AST: 13 U/L — ABNORMAL LOW (ref 15–41)
Albumin: 3.9 g/dL (ref 3.5–5.0)
Alkaline Phosphatase: 52 U/L (ref 38–126)
Anion gap: 10 (ref 5–15)
BUN: 21 mg/dL — ABNORMAL HIGH (ref 6–20)
CO2: 24 mmol/L (ref 22–32)
Calcium: 9.3 mg/dL (ref 8.9–10.3)
Chloride: 100 mmol/L (ref 98–111)
Creatinine, Ser: 1.29 mg/dL — ABNORMAL HIGH (ref 0.44–1.00)
GFR, Estimated: 49 mL/min — ABNORMAL LOW (ref >60)
Glucose, Bld: 118 mg/dL — ABNORMAL HIGH (ref 70–99)
Potassium: 4.1 mmol/L (ref 3.5–5.1)
Sodium: 134 mmol/L — ABNORMAL LOW (ref 135–145)
Total Bilirubin: 3.5 mg/dL — ABNORMAL HIGH (ref 0.3–1.2)
Total Protein: 8 g/dL (ref 6.5–8.1)

## 2022-11-06 LAB — CBC WITH DIFFERENTIAL/PLATELET
Abs Immature Granulocytes: 0.13 10*3/uL — ABNORMAL HIGH (ref 0.00–0.07)
Basophils Absolute: 0.1 10*3/uL (ref 0.0–0.1)
Basophils Relative: 0 %
Eosinophils Absolute: 0.1 10*3/uL (ref 0.0–0.5)
Eosinophils Relative: 1 %
HCT: 41.5 % (ref 36.0–46.0)
Hemoglobin: 13 g/dL (ref 12.0–15.0)
Immature Granulocytes: 1 %
Lymphocytes Relative: 8 %
Lymphs Abs: 1.4 10*3/uL (ref 0.7–4.0)
MCH: 29.3 pg (ref 26.0–34.0)
MCHC: 31.3 g/dL (ref 30.0–36.0)
MCV: 93.5 fL (ref 80.0–100.0)
Monocytes Absolute: 1.5 10*3/uL — ABNORMAL HIGH (ref 0.1–1.0)
Monocytes Relative: 9 %
Neutro Abs: 13.5 10*3/uL — ABNORMAL HIGH (ref 1.7–7.7)
Neutrophils Relative %: 81 %
Platelets: 225 10*3/uL (ref 150–400)
RBC: 4.44 MIL/uL (ref 3.87–5.11)
RDW: 16.9 % — ABNORMAL HIGH (ref 11.5–15.5)
WBC: 16.7 10*3/uL — ABNORMAL HIGH (ref 4.0–10.5)
nRBC: 0 % (ref 0.0–0.2)

## 2022-11-06 LAB — RESP PANEL BY RT-PCR (RSV, FLU A&B, COVID)  RVPGX2
Influenza A by PCR: NEGATIVE
Influenza B by PCR: NEGATIVE
Resp Syncytial Virus by PCR: NEGATIVE
SARS Coronavirus 2 by RT PCR: NEGATIVE

## 2022-11-06 LAB — I-STAT CG4 LACTIC ACID, ED
Lactic Acid, Venous: 2 mmol/L (ref 0.5–1.9)
Lactic Acid, Venous: 1.3 mmol/L (ref 0.5–1.9)

## 2022-11-06 LAB — TROPONIN I (HIGH SENSITIVITY): Troponin I (High Sensitivity): 16 ng/L (ref <18)

## 2022-11-06 MED ORDER — CLINDAMYCIN PHOSPHATE 600 MG/50ML IV SOLN
600.0000 mg | Freq: Once | INTRAVENOUS | Status: AC
  Administered 2022-11-07: 600 mg via INTRAVENOUS
  Filled 2022-11-06: qty 50

## 2022-11-06 MED ORDER — LACTATED RINGERS IV BOLUS
1000.0000 mL | Freq: Once | INTRAVENOUS | Status: AC
  Administered 2022-11-06: 1000 mL via INTRAVENOUS

## 2022-11-06 MED ORDER — SODIUM CHLORIDE 0.9 % IV BOLUS: 1000.0000 mL | Freq: Once | INTRAVENOUS | Status: DC

## 2022-11-06 NOTE — ED Provider Notes (Signed)
[place] EMERGENCY DEPARTMENT AT [facility] Provider Note   CSN: [id number] Arrival date & time: 11/06/22  1357     History  Chief Complaint  Patient presents with   Fatigue    [name] is a 57 y.o. female history of lymphedema, pneumonia, PE currently on Eliquis, RA, cellulitis, ischemic chest presenting for left leg swelling.  Patient notes that her legs are swollen at baseline however believes that her left lower leg is infected in size more swollen than the right.  Patient states that at PT today her temperature was 99 F.  Patient denies chest pain, shortness of breath, abdominal pain, nausea/vomiting, change in sensation/motor skills.   Home Medications Prior to Admission medications   Medication Sig Start Date End Date Taking? Authorizing Provider  albuterol (VENTOLIN HFA) 108 (90 Base) MCG/ACT inhaler Inhale 2 puffs into the lungs every 6 (six) hours as needed for wheezing or shortness of breath. 07/12/20   Claiborne Rigg, NP  apixaban (ELIQUIS) 5 MG TABS tablet Take 1 tablet (5 mg total) by mouth 2 (two) times daily. 07/07/22   Claiborne Rigg, NP  CIMZIA 2 X 200 MG/ML PSKT Inject into the skin every 14 (fourteen) days. 11/07/21   [provider]  clotrimazole (CLOTRIMAZOLE ANTI-FUNGAL) 1 % cream Apply 1 Application topically 2 (two) times daily. 03/06/22   Claiborne Rigg, NP  ergocalciferol (VITAMIN D2) 1.25 MG (50000 UT) capsule 1 capsule Orally q week for 30 days    [provider]  ezetimibe (ZETIA) 10 MG tablet Take 1 tablet (10 mg total) by mouth daily. For high cholesterol 10/11/22   Claiborne Rigg, NP  famotidine (PEPCID) 40 MG tablet Take 1 tablet (40 mg total) by mouth daily. 10/11/22   Claiborne Rigg, NP  folic acid (FOLVITE) 1 MG tablet Take 1 tablet by mouth once daily 07/26/22   Hoy Register, MD  losartan (COZAAR) 25 MG tablet Take 1 tablet by mouth once daily 10/20/22   Orbie Pyo, MD  meclizine (ANTIVERT) 12.5 MG  tablet Take 1-2 tablets (12.5-25 mg total) by mouth 3 (three) times daily as needed for dizziness. 10/11/22   Claiborne Rigg, NP  methotrexate (50 MG/ML) 1 g injection Inject into the vein once.    [provider]  Methotrexate Sodium (METHOTREXATE, PF,) 250 MG/10ML injection Inject 0.8 mg into the muscle.    [provider]  Misc. Devices (BARIATRIC ROLLATOR) MISC Please provide patient with insurance approved bariatric rollator walker. I89.0, R53.81, R26.81 11/05/19   Claiborne Rigg, NP      Allergies    Abatacept, Bee venom, Augmentin [amoxicillin-pot clavulanate], Latex, and Nulytely [peg 3350-kcl-na bicarb-nacl]    Review of Systems   Review of Systems  Physical Exam Updated Vital Signs BP (!) 78/56 (BP Location: Left Arm)   Pulse (!) 105   Temp 98.6 F (37 C) (Oral)   Resp 18   Ht 5\' 9"  (1.753 m)   Wt (!) 152.4 kg   LMP 01/31/2011   SpO2 100%   BMI 49.62 kg/m  Physical Exam Constitutional:      General: She is not in acute distress.    Comments: Alert and not currently endorsing fatigue  Cardiovascular:     Rate and Rhythm: Normal rate and regular rhythm.     Comments: Cannot palpate pulses due to lymphedema Pulmonary:     Effort: Pulmonary effort is normal. No respiratory distress.     Breath sounds:  Normal breath sounds.  Abdominal:     Palpations: Abdomen is soft.     Tenderness: There is no abdominal tenderness. There is no guarding or rebound.  Skin:    General: Skin is warm and dry.     Capillary Refill: Capillary refill takes less than 2 seconds.  Neurological:     Mental Status: She is alert.     Comments: Sensation intact distally     ED Results / Procedures / Treatments   Labs (all labs ordered are listed, but only abnormal results are displayed) Labs Reviewed  COMPREHENSIVE METABOLIC PANEL - Abnormal; Notable for the following components:      Result Value   Sodium 134 (*)    Glucose, Bld 118 (*)    BUN 21 (*)    Creatinine,  Ser 1.29 (*)    AST 13 (*)    Total Bilirubin 3.5 (*)    GFR, Estimated 49 (*)    All other components within normal limits  CBC WITH DIFFERENTIAL/PLATELET - Abnormal; Notable for the following components:   WBC 16.7 (*)    RDW 16.9 (*)    Neutro Abs 13.5 (*)    Monocytes Absolute 1.5 (*)    Abs Immature Granulocytes 0.13 (*)    All other components within normal limits  RESP PANEL BY RT-PCR (RSV, FLU A&B, COVID)  RVPGX2  CULTURE, BLOOD (ROUTINE X 2)  CULTURE, BLOOD (ROUTINE X 2)  I-STAT CG4 LACTIC ACID, ED    EKG None  Radiology No results found.  Procedures Procedures    Medications Ordered in ED Medications  sodium chloride 0.9 % bolus 1,000 mL (has no administration in time range)    ED Course/ Medical Decision Making/ A&P                                 Medical Decision Making Amount and/or Complexity of Data Reviewed Labs: ordered. Radiology: ordered.   [name] 58 y.o. presented today for left leg pain, fatigue. Working DDx that I considered at this time includes, but not limited to, cellulitis, DVT, neurovascular compromise, electrolyte abnormalities, pneumonia, ACS, viral illness, UTI.  R/o DDx: Pending  Review of prior external notes: 11/06/2022 office visit  Unique Tests and My Interpretation:  Lactic acid: 2.0 CBC: Leukocytosis 16.7 CMP: Unremarkable Respiratory panel: Negative Blood cultures: Negative Troponin: Pending UA: Pending Left tib-fib x-ray: Pending Left foot x-ray: Pending Left lower extremity DVT study: Pending  Discussion with Independent Historian: None  Discussion of Management of Tests: None  Risk: Pending  Risk Stratification Score: none  Staffed with Schlossman, MD  Plan: On exam patient was initially in no acute distress with stable vitals.  Patient was rushed back to recess a due to having a blood pressure 78/56 in the waiting room however when I went into the room patient's blood pressure was 110/60 and  patient was resting comfortably in the bed.  Asked patient about fatigue and patient stated that she was more concerned about her left leg.  Patient was able to wiggle her toes and have full sensation distally but did note to have erythema in the leg that was different than the other leg.  Patient does have new white count and in conjunction with physical exam patient was likely has cellulitis however will order ultrasound to rule out blood clots patient does have history of blood clots.  Patient takes Eliquis daily and has not  missed any doses.  The nurse notified the attending and me that patient is becoming more more somnolent.  Patient's blood pressure has remained normotensive and patient does not have a fever.  Wollin to go assess the patient at the bedside patient does appear slightly more somnolent than before.  Will order EKG and troponin to further assess as patient does have history of PE and clots but is not currently endorsing any chest pain or shortness of breath.  Patient's lactic came back at 2.0 however patient is already received a bolus of lactated ringer.  Patient signed out to [place], [place].  Plan is to admit for cellulitis for IV antibiotics pending labs and imaging.  Patient stable this time.         Final Clinical Impression(s) / ED Diagnoses Final diagnoses:  None    Rx / DC Orders ED Discharge Orders     None         Remi Deter 11/06/22 2212    Alvira Monday, MD 11/07/22 1315

## 2022-11-06 NOTE — ED Triage Notes (Signed)
Patient stated she has felt unwell for 2 days. Had a temperature of 39F this morning. Has lymphedema in both legs. Complaining of left leg weeping.

## 2022-11-06 NOTE — ED Provider Triage Note (Signed)
Emergency Medicine Provider Triage Evaluation Note  [name] , a 57 y.o. female  was evaluated in triage. Has chronic lymphedema in both her legs.  Pt complains of leg pain and swelling with malaise.  States she has been feeling unwell for the last couple days.  Notes pain and more swelling in the left leg with redness and "weeping".  Endorse is subjective fever at home as well.  Concern for infection in her left leg.  Review of Systems  Positive: See above Negative: See above  Physical Exam  BP (!) 78/56 (BP Location: Left Arm)   Pulse (!) 105   Temp 98.6 F (37 C) (Oral)   Resp 18   Ht 5\' 9"  (1.753 m)   Wt (!) 152.4 kg   LMP 01/31/2011   SpO2 100%   BMI 49.62 kg/m  Gen:   Awake, no distress   Resp:  Normal effort  MSK:   Moves extremities without difficulty  Other:  Erythema noted to the back of the left leg.  It is warm to touch.  Medical Decision Making  Medically screening exam initiated at 7:46 PM.  Appropriate orders placed.  [name] was informed that the remainder of the evaluation will be completed by another provider, this initial triage assessment does not replace that evaluation, and the importance of remaining in the ED until their evaluation is complete.  Work up started   Gareth Eagle, PA-C 11/06/22 1950

## 2022-11-07 ENCOUNTER — Encounter: Payer: Medicare HMO | Admitting: Occupational Therapy

## 2022-11-07 ENCOUNTER — Inpatient Hospital Stay (HOSPITAL_COMMUNITY): Payer: Medicare HMO

## 2022-11-07 DIAGNOSIS — L0390 Cellulitis, unspecified: Secondary | ICD-10-CM | POA: Diagnosis not present

## 2022-11-07 DIAGNOSIS — N179 Acute kidney failure, unspecified: Secondary | ICD-10-CM | POA: Diagnosis not present

## 2022-11-07 DIAGNOSIS — I10 Essential (primary) hypertension: Secondary | ICD-10-CM | POA: Diagnosis present

## 2022-11-07 DIAGNOSIS — Z86718 Personal history of other venous thrombosis and embolism: Secondary | ICD-10-CM | POA: Diagnosis not present

## 2022-11-07 DIAGNOSIS — Z6841 Body Mass Index (BMI) 40.0 and over, adult: Secondary | ICD-10-CM | POA: Diagnosis not present

## 2022-11-07 DIAGNOSIS — Z8249 Family history of ischemic heart disease and other diseases of the circulatory system: Secondary | ICD-10-CM | POA: Diagnosis not present

## 2022-11-07 DIAGNOSIS — L03113 Cellulitis of right upper limb: Secondary | ICD-10-CM

## 2022-11-07 DIAGNOSIS — Z800 Family history of malignant neoplasm of digestive organs: Secondary | ICD-10-CM | POA: Diagnosis not present

## 2022-11-07 DIAGNOSIS — I444 Left anterior fascicular block: Secondary | ICD-10-CM | POA: Diagnosis not present

## 2022-11-07 DIAGNOSIS — R6520 Severe sepsis without septic shock: Secondary | ICD-10-CM | POA: Diagnosis not present

## 2022-11-07 DIAGNOSIS — Z880 Allergy status to penicillin: Secondary | ICD-10-CM | POA: Diagnosis not present

## 2022-11-07 DIAGNOSIS — R0602 Shortness of breath: Secondary | ICD-10-CM | POA: Diagnosis not present

## 2022-11-07 DIAGNOSIS — E860 Dehydration: Secondary | ICD-10-CM | POA: Diagnosis not present

## 2022-11-07 DIAGNOSIS — M7989 Other specified soft tissue disorders: Secondary | ICD-10-CM | POA: Diagnosis not present

## 2022-11-07 DIAGNOSIS — R0989 Other specified symptoms and signs involving the circulatory and respiratory systems: Secondary | ICD-10-CM | POA: Diagnosis not present

## 2022-11-07 DIAGNOSIS — R011 Cardiac murmur, unspecified: Secondary | ICD-10-CM | POA: Diagnosis not present

## 2022-11-07 DIAGNOSIS — E7800 Pure hypercholesterolemia, unspecified: Secondary | ICD-10-CM | POA: Diagnosis not present

## 2022-11-07 DIAGNOSIS — M069 Rheumatoid arthritis, unspecified: Secondary | ICD-10-CM | POA: Diagnosis not present

## 2022-11-07 DIAGNOSIS — Z7901 Long term (current) use of anticoagulants: Secondary | ICD-10-CM | POA: Diagnosis not present

## 2022-11-07 DIAGNOSIS — K219 Gastro-esophageal reflux disease without esophagitis: Secondary | ICD-10-CM | POA: Diagnosis not present

## 2022-11-07 DIAGNOSIS — Z1152 Encounter for screening for COVID-19: Secondary | ICD-10-CM | POA: Diagnosis not present

## 2022-11-07 DIAGNOSIS — Z86711 Personal history of pulmonary embolism: Secondary | ICD-10-CM | POA: Diagnosis not present

## 2022-11-07 DIAGNOSIS — M79662 Pain in left lower leg: Secondary | ICD-10-CM

## 2022-11-07 DIAGNOSIS — R17 Unspecified jaundice: Secondary | ICD-10-CM | POA: Diagnosis not present

## 2022-11-07 DIAGNOSIS — Z90710 Acquired absence of both cervix and uterus: Secondary | ICD-10-CM | POA: Diagnosis not present

## 2022-11-07 DIAGNOSIS — Z79899 Other long term (current) drug therapy: Secondary | ICD-10-CM | POA: Diagnosis not present

## 2022-11-07 DIAGNOSIS — I890 Lymphedema, not elsewhere classified: Secondary | ICD-10-CM | POA: Diagnosis not present

## 2022-11-07 DIAGNOSIS — M799 Soft tissue disorder, unspecified: Secondary | ICD-10-CM | POA: Diagnosis not present

## 2022-11-07 DIAGNOSIS — R5383 Other fatigue: Secondary | ICD-10-CM | POA: Diagnosis not present

## 2022-11-07 DIAGNOSIS — A419 Sepsis, unspecified organism: Secondary | ICD-10-CM | POA: Diagnosis not present

## 2022-11-07 DIAGNOSIS — Z91030 Bee allergy status: Secondary | ICD-10-CM | POA: Diagnosis not present

## 2022-11-07 DIAGNOSIS — R509 Fever, unspecified: Secondary | ICD-10-CM | POA: Diagnosis not present

## 2022-11-07 DIAGNOSIS — L03116 Cellulitis of left lower limb: Secondary | ICD-10-CM | POA: Diagnosis not present

## 2022-11-07 DIAGNOSIS — I3139 Other pericardial effusion (noninflammatory): Secondary | ICD-10-CM | POA: Diagnosis not present

## 2022-11-07 LAB — COMPREHENSIVE METABOLIC PANEL
ALT: 17 U/L (ref 0–44)
AST: 11 U/L — ABNORMAL LOW (ref 15–41)
Albumin: 2.6 g/dL — ABNORMAL LOW (ref 3.5–5.0)
Alkaline Phosphatase: 36 U/L — ABNORMAL LOW (ref 38–126)
Anion gap: 7 (ref 5–15)
BUN: 26 mg/dL — ABNORMAL HIGH (ref 6–20)
CO2: 24 mmol/L (ref 22–32)
Calcium: 8.2 mg/dL — ABNORMAL LOW (ref 8.9–10.3)
Chloride: 103 mmol/L (ref 98–111)
Creatinine, Ser: 1.66 mg/dL — ABNORMAL HIGH (ref 0.44–1.00)
GFR, Estimated: 36 mL/min — ABNORMAL LOW (ref >60)
Glucose, Bld: 133 mg/dL — ABNORMAL HIGH (ref 70–99)
Potassium: 3.5 mmol/L (ref 3.5–5.1)
Sodium: 134 mmol/L — ABNORMAL LOW (ref 135–145)
Total Bilirubin: 2.3 mg/dL — ABNORMAL HIGH (ref 0.3–1.2)
Total Protein: 6 g/dL — ABNORMAL LOW (ref 6.5–8.1)

## 2022-11-07 LAB — MAGNESIUM: Magnesium: 2.2 mg/dL (ref 1.7–2.4)

## 2022-11-07 LAB — CBC
HCT: 30.6 % — ABNORMAL LOW (ref 36.0–46.0)
Hemoglobin: 9.7 g/dL — ABNORMAL LOW (ref 12.0–15.0)
MCH: 29.2 pg (ref 26.0–34.0)
MCHC: 31.7 g/dL (ref 30.0–36.0)
MCV: 92.2 fL (ref 80.0–100.0)
Platelets: 167 10*3/uL (ref 150–400)
RBC: 3.32 MIL/uL — ABNORMAL LOW (ref 3.87–5.11)
RDW: 16.6 % — ABNORMAL HIGH (ref 11.5–15.5)
WBC: 16.2 10*3/uL — ABNORMAL HIGH (ref 4.0–10.5)
nRBC: 0 % (ref 0.0–0.2)

## 2022-11-07 LAB — HIV ANTIBODY (ROUTINE TESTING W REFLEX): HIV Screen 4th Generation wRfx: NONREACTIVE

## 2022-11-07 LAB — PHOSPHORUS: Phosphorus: 2.6 mg/dL (ref 2.5–4.6)

## 2022-11-07 LAB — TROPONIN I (HIGH SENSITIVITY): Troponin I (High Sensitivity): 17 ng/L (ref <18)

## 2022-11-07 MED ORDER — EZETIMIBE 10 MG PO TABS
10.0000 mg | ORAL_TABLET | Freq: Every day | ORAL | Status: DC
  Administered 2022-11-07 – 2022-11-16 (×10): 10 mg via ORAL
  Filled 2022-11-07 (×10): qty 1

## 2022-11-07 MED ORDER — SODIUM CHLORIDE 0.9 % IV SOLN: INTRAVENOUS | Status: AC

## 2022-11-07 MED ORDER — VANCOMYCIN HCL 1250 MG/250ML IV SOLN
1250.0000 mg | Freq: Two times a day (BID) | INTRAVENOUS | Status: DC
  Administered 2022-11-07 – 2022-11-10 (×6): 1250 mg via INTRAVENOUS
  Filled 2022-11-07 (×6): qty 250

## 2022-11-07 MED ORDER — POLYETHYLENE GLYCOL 3350 17 G PO PACK
17.0000 g | PACK | Freq: Every day | ORAL | Status: DC | PRN
  Administered 2022-11-08 – 2022-11-09 (×2): 17 g via ORAL
  Filled 2022-11-07 (×2): qty 1

## 2022-11-07 MED ORDER — SODIUM CHLORIDE 0.9 % IV BOLUS
500.0000 mL | INTRAVENOUS | Status: AC
  Administered 2022-11-07: 500 mL via INTRAVENOUS

## 2022-11-07 MED ORDER — OXYCODONE HCL 5 MG PO TABS
5.0000 mg | ORAL_TABLET | Freq: Four times a day (QID) | ORAL | Status: DC | PRN
  Administered 2022-11-08 – 2022-11-11 (×8): 5 mg via ORAL
  Filled 2022-11-07 (×8): qty 1

## 2022-11-07 MED ORDER — MELATONIN 5 MG PO TABS
5.0000 mg | ORAL_TABLET | Freq: Every evening | ORAL | Status: DC | PRN
  Administered 2022-11-13: 5 mg via ORAL
  Filled 2022-11-07: qty 1

## 2022-11-07 MED ORDER — ALBUTEROL SULFATE (2.5 MG/3ML) 0.083% IN NEBU: 2.5000 mg | INHALATION_SOLUTION | Freq: Four times a day (QID) | RESPIRATORY_TRACT | Status: DC | PRN

## 2022-11-07 MED ORDER — MIDODRINE HCL 5 MG PO TABS
10.0000 mg | ORAL_TABLET | ORAL | Status: AC
  Administered 2022-11-07: 10 mg via ORAL
  Filled 2022-11-07: qty 2

## 2022-11-07 MED ORDER — ACETAMINOPHEN 325 MG PO TABS
650.0000 mg | ORAL_TABLET | Freq: Four times a day (QID) | ORAL | Status: DC | PRN
  Administered 2022-11-09 – 2022-11-15 (×8): 650 mg via ORAL
  Filled 2022-11-07 (×8): qty 2

## 2022-11-07 MED ORDER — VANCOMYCIN HCL 2000 MG/400ML IV SOLN
2000.0000 mg | Freq: Once | INTRAVENOUS | Status: AC
  Administered 2022-11-07: 2000 mg via INTRAVENOUS
  Filled 2022-11-07: qty 400

## 2022-11-07 MED ORDER — SODIUM CHLORIDE 0.9 % IV SOLN
2.0000 g | Freq: Every day | INTRAVENOUS | Status: DC
  Administered 2022-11-07 – 2022-11-10 (×4): 2 g via INTRAVENOUS
  Filled 2022-11-07 (×4): qty 20

## 2022-11-07 MED ORDER — FOLIC ACID 1 MG PO TABS
1.0000 mg | ORAL_TABLET | Freq: Every day | ORAL | Status: DC
  Administered 2022-11-07 – 2022-11-16 (×10): 1 mg via ORAL
  Filled 2022-11-07 (×10): qty 1

## 2022-11-07 MED ORDER — VITAMIN D (ERGOCALCIFEROL) 1.25 MG (50000 UNIT) PO CAPS
50000.0000 [IU] | ORAL_CAPSULE | ORAL | Status: DC
  Administered 2022-11-11: 50000 [IU] via ORAL
  Filled 2022-11-07: qty 1

## 2022-11-07 MED ORDER — MIDODRINE HCL 5 MG PO TABS
10.0000 mg | ORAL_TABLET | Freq: Three times a day (TID) | ORAL | Status: DC | PRN
  Administered 2022-11-07: 10 mg via ORAL
  Filled 2022-11-07: qty 2

## 2022-11-07 MED ORDER — PROCHLORPERAZINE EDISYLATE 10 MG/2ML IJ SOLN: 5.0000 mg | Freq: Four times a day (QID) | INTRAMUSCULAR | Status: DC | PRN

## 2022-11-07 MED ORDER — FAMOTIDINE 20 MG PO TABS
40.0000 mg | ORAL_TABLET | Freq: Every day | ORAL | Status: DC
  Administered 2022-11-07 – 2022-11-16 (×10): 40 mg via ORAL
  Filled 2022-11-07 (×10): qty 2

## 2022-11-07 MED ORDER — APIXABAN 5 MG PO TABS
5.0000 mg | ORAL_TABLET | Freq: Two times a day (BID) | ORAL | Status: DC
  Administered 2022-11-07 – 2022-11-16 (×19): 5 mg via ORAL
  Filled 2022-11-07 (×19): qty 1

## 2022-11-07 NOTE — Progress Notes (Signed)
Progress Note   Patient: [name] ZOX:[id number] DOB: 1966/01/14 DOA: 11/06/2022     0 DOS: the patient was seen and examined on 11/07/2022   Brief hospital course: 56yo with h/o morbid obesity with lymphedema, antiphospholipid syndrome, PE on Eliquis, and RA who presented on 9/9 with LLE pain and erythema, diagnosed with sepsis due to cellulitis.  She was started on Rocephin and Vancomycin.    Assessment and Plan:  Sepsis due to LLE cellulitis SIRS criteria in this patient includes: Leukocytosis, tachycardia  Patient has evidence of acute organ failure with recurrent hypotension (SBP < 90 or MAP < 65 x 2 readings) that is not easily explained by another condition. While awaiting blood cultures, this appears to be a preseptic condition. Sepsis protocol initiated Suspected source is LLE cellulitis Patient with h/o severe lymphedema of BLE Currently with erythema to L lower leg She was given Vanc and Ceftriaxone in the ER Admission in this patient is warranted due to:  -high-risk comorbid condition is indicated by severe lymphedema and morbid obesity Blood cultures pending  AKI, prerenal secondary to dehydration Baseline creatinine 0.9 with GFR of greater than 60 Presented with creatinine of 1.29 with GFR 49, up to 1.66 with GFR 36 today Avoid nephrotoxic agents, dehydration and hypotension IV fluid hydration NS at 75 cc/h x 2 days Monitor urine output Repeat chemistry panel in the morning.  Lymphedema Significant baseline lymphedema with ambulatory dysfunction PT/OT consutled   Murmur Significant murmur appreciated on today's exam Prior echo in 03/2021 without apparent source Will repeat Echo  History of hypertension Currently blood pressures are soft in the setting of sepsis Hold off home losartan Maintain MAP greater than 65 Midodrine as needed to maintain MAP above 65. Closely monitor vital signs   History of pulmonary embolism on Eliquis Resume home Eliquis    Rheumatoid arthritis Hold methotrexate, Cimzia   GERD Resume famotidine   Physical debility PT OT assessment Fall precautions.   Isolated elevated T. Bili T. bili 3.5 on presentation, now 2.3 Likely associated with infection, sepsis Recheck CMP in AM  Morbid obesity Body mass index is 49.62 kg/m.Marland Kitchen  Weight loss should be encouraged Outpatient PCP/bariatric medicine/bariatric surgery f/u encouraged    Consultants: PT/OT  Procedures: None  Antibiotics: Vancomycin 9/10- Ceftriaxone 9/10- Clindamycin 9/9  30 Day Unplanned Readmission Risk Score    Flowsheet Row ED to Hosp-Admission (Current) from 11/06/2022 in [facility]  30 Day Unplanned Readmission Risk Score (%) 19.06 Filed at 11/07/2022 0801       This score is the patient's risk of an unplanned readmission within 30 days of being discharged (0 -100%). The score is based on dignosis, age, lab data, medications, orders, and past utilization.   Low:  0-14.9   Medium: 15-21.9   High: 22-29.9   Extreme: 30 and above           Subjective: She reports some improvement in pain, redness - has baseline lymphedema and uses a rolling walker for ambulation. She was able to get up with PT today to ambulate some.  She thinks she needs at least one more day in the hospital.   Objective: Vitals:   11/07/22 0750 11/07/22 0826  BP: (!) 113/45 (!) 109/46  Pulse: 77 80  Resp: 16   Temp: 98.4 F (36.9 C)   SpO2: 100%     Intake/Output Summary (Last 24 hours) at 11/07/2022 1646 Last data filed at 11/07/2022 1512 Gross per 24 hour  Intake 674.66 ml  Output --  Net 674.66 ml   Filed Weights   11/06/22 1404  Weight: (!) 152.4 kg    Exam:  General:  Appears calm and comfortable and is in NAD Eyes:  EOMI, normal lids, iris ENT:  grossly normal hearing, lips & tongue, mmm; poor dentition Neck:  no LAD, masses or thyromegaly Cardiovascular:  RRR, no r/g, 3-4/6 systolic murmur.   Respiratory:   CTA bilaterally with no wheezes/rales/rhonchi.  Normal respiratory effort. Abdomen:  soft, NT, ND Back:   normal alignment, no CVAT Skin:  no rash or induration seen on limited exam Musculoskeletal:  BLE lymphedema with chronic foot deformities; LLE with erythema that patient states is somewhat improved Psychiatric:  blunted mood and affect, speech fluent and appropriate, AOx3 Neurologic:  CN 2-12 grossly intact    Data Reviewed: I have reviewed the patient's lab results since admission.  Pertinent labs for today include:   Glucose 133 BUN 26/Creatinine 1.66/GFR 36 Albumin 2.6 Bili 2.3 WBC 16.2 Hgb 9.7 Lactate 2, 1.3 Blood cultures pending    Family Communication: None present  Disposition: Status is: Inpatient Remains inpatient appropriate because: ongoing IV antibiotics     Time spent: 50 minutes  Unresulted Labs (From admission, onward)     Start     Ordered   11/08/22 0500  Comprehensive metabolic panel  Tomorrow morning,   R        11/07/22 1643   11/08/22 0500  CBC with Differential/Platelet  Tomorrow morning,   R        11/07/22 1643   11/07/22 1000  Culture, blood (single) w Reflex to ID Panel  Once,   R        11/07/22 0959   11/06/22 2124  Urinalysis, Routine w reflex microscopic -Urine, Clean Catch  Once,   URGENT       Question:  Specimen Source  Answer:  Urine, Clean Catch   11/06/22 2123   11/06/22 1955  Culture, blood (routine x 2)  BLOOD CULTURE X 2,   R      11/06/22 1954             Author: Jonah Blue, MD 11/07/2022 4:46 PM  For on call review [email or web address].

## 2022-11-07 NOTE — Progress Notes (Signed)
Mobility Specialist: Progress Note   11/07/22 1639  Mobility  Activity Ambulated independently in hallway  Level of Assistance Modified independent, requires aide device or extra time  Assistive Device Four wheel walker  Distance Ambulated (ft) 170 ft  Activity Response Tolerated well  Mobility Referral Yes  $Mobility charge 1 Mobility  Mobility Specialist Start Time (ACUTE ONLY) 1607  Mobility Specialist Stop Time (ACUTE ONLY) 1624  Mobility Specialist Time Calculation (min) (ACUTE ONLY) 17 min    Pt was agreeable to mobility session - received in bed. Had c/o LLE pain rated 3/10 that felt better while ambulating. Returned to room without fault. Left in bed with all needs met, call bell in reach.   Maurene Capes Mobility Specialist Please contact via SecureChat or Rehab office at [phone]

## 2022-11-07 NOTE — ED Notes (Signed)
RN given report on Ortho floor at [facility]

## 2022-11-07 NOTE — Plan of Care (Signed)
  Problem: Nutrition: Goal: Adequate nutrition will be maintained Outcome: Progressing   Problem: Safety: Goal: Ability to remain free from injury will improve Outcome: Progressing   Problem: Skin Integrity: Goal: Risk for impaired skin integrity will decrease Outcome: Progressing   

## 2022-11-07 NOTE — ED Notes (Signed)
ED TO INPATIENT HANDOFF REPORT  ED Nurse Name and Phone #: Deon Pilling [phone]  S Name/Age/Gender [name] 57 y.o. female Room/Bed: RESA/RESA  Code Status   Code Status: Full Code  Home/SNF/Other Home Patient oriented to: self, place, time, and situation Is this baseline? Yes   Triage Complete: Triage complete  Chief Complaint Cellulitis [L03.90]  Triage Note Patient stated she has felt unwell for 2 days. Had a temperature of 15F this morning. Has lymphedema in both legs. Complaining of left leg weeping.    Allergies Allergies  Allergen Reactions   Abatacept Anaphylaxis    Other reaction(s): Anaphylaxis-Streptomycin Clickject Other reaction(s): Anaphylaxis-Streptomycin   Bee Venom Anaphylaxis   Augmentin [Amoxicillin-Pot Clavulanate] Hives, Itching and Other (See Comments)    Did PCN reaction causing immediate rash, facial/tongue/throat swelling, SOB or lightheadedness with hypotension: yes Did PCN reaction causing severe rash involving mucus membranes or skin necrosis: no Has patient had a PCN reaction that required hospitalization: in hospital Has patient had a PCN reaction occurring within the last 10 years: no If all of the above answers are "NO", then may proceed with Cephalosporin use.  Pt reports no recollection of any reactions when taking penicillin in past   Latex Hives and Other (See Comments)    Local reaction (welts). Patient denies any wheezing or other reaction with latex exposure   Nulytely [Peg 3350-Kcl-Na Bicarb-Nacl]     NAUSEA AND VOMITING. MAY TOLERATE LOW VOLUME PREP.    Level of Care/Admitting Diagnosis ED Disposition     ED Disposition  Admit   Condition  --   Comment  Hospital Area: [facility] [[id number]]  Level of Care: Telemetry [5]  Admit to tele based on following criteria: Monitor for Ischemic changes  May admit patient to [facility] or [facility] if equivalent level of care is available:: Yes  Covid  Evaluation: Asymptomatic - no recent exposure (last 10 days) testing not required  Diagnosis: Cellulitis [536644]  Admitting Physician: Darlin Drop [[id number]]  Attending Physician: Darlin Drop [[id number]]  Certification:: I certify this patient will need inpatient services for at least 2 midnights  Expected Medical Readiness: 11/09/2022          B Medical/Surgery History Past Medical History:  Diagnosis Date   Anemia    Anxiety    Arthritis    Asthma    hx as child - no prob as adult - no inhaler   Blood transfusion 01/22/11   transfusion 2 units at [facility]   Depression 08/2010   psych assessment   Dyspnea    Fibroid    Headache(784.0)    rx for imitrex - last one jan   Keloid    Lymphedema    Pulmonary embolism (HCC)    Past Surgical History:  Procedure Laterality Date   ABDOMINAL HYSTERECTOMY     COLONOSCOPY N/A 11/08/2017   Procedure: COLONOSCOPY;  Surgeon: West Bali, MD;  Location: [facility];  Service: Endoscopy;  Laterality: N/A;  2:00pm   FLEXIBLE SIGMOIDOSCOPY N/A 09/17/2017   Procedure: FLEXIBLE SIGMOIDOSCOPY;  Surgeon: West Bali, MD;  Location: [facility];  Service: Endoscopy;  Laterality: N/A;   HERNIA REPAIR  2009   umbilical   POLYPECTOMY  11/08/2017   Procedure: POLYPECTOMY;  Surgeon: West Bali, MD;  Location: [facility];  Service: Endoscopy;;  ascending colon (CSx1), transverse colon (CS x1), splenic flexure (HSx1)   SVD     Spontaneous vaginal delivery; x 1  A IV Location/Drains/Wounds Patient Lines/Drains/Airways Status     Active Line/Drains/Airways     Name Placement date Placement time Site Days   Peripheral IV 11/06/22 20 G 1" Left Antecubital 11/06/22  2100  Antecubital  1   Incision - 4 Ports  Umbilicus  Right;Distal  Right;Proximal  Left;Lower 02/22/11  0800  -- 4276            Intake/Output Last 24 hours No intake or output data in the 24 hours ending 11/07/22 0402  Labs/Imaging Results for orders  placed or performed during the hospital encounter of 11/06/22 (from the past 48 hour(s))  Comprehensive metabolic panel     Status: Abnormal   Collection Time: 11/06/22  2:36 PM  Result Value Ref Range   Sodium 134 (L) 135 - 145 mmol/L   Potassium 4.1 3.5 - 5.1 mmol/L   Chloride 100 98 - 111 mmol/L   CO2 24 22 - 32 mmol/L   Glucose, Bld 118 (H) 70 - 99 mg/dL    Comment: Glucose reference range applies only to samples taken after fasting for at least 8 hours.   BUN 21 (H) 6 - 20 mg/dL   Creatinine, Ser 4.09 (H) 0.44 - 1.00 mg/dL   Calcium 9.3 8.9 - 81.1 mg/dL   Total Protein 8.0 6.5 - 8.1 g/dL   Albumin 3.9 3.5 - 5.0 g/dL   AST 13 (L) 15 - 41 U/L   ALT 21 0 - 44 U/L   Alkaline Phosphatase 52 38 - 126 U/L   Total Bilirubin 3.5 (H) 0.3 - 1.2 mg/dL   GFR, Estimated 49 (L) >60 mL/min    Comment: (NOTE) Calculated using the CKD-EPI Creatinine Equation (2021)    Anion gap 10 5 - 15    Comment: Performed at [facility], [address], [place], [place] [address]  CBC with Differential     Status: Abnormal   Collection Time: 11/06/22  2:36 PM  Result Value Ref Range   WBC 16.7 (H) 4.0 - 10.5 K/uL   RBC 4.44 3.87 - 5.11 MIL/uL   Hemoglobin 13.0 12.0 - 15.0 g/dL   HCT 29.5 62.1 - 30.8 %   MCV 93.5 80.0 - 100.0 fL   MCH 29.3 26.0 - 34.0 pg   MCHC 31.3 30.0 - 36.0 g/dL   RDW 65.7 (H) 84.6 - 96.2 %   Platelets 225 150 - 400 K/uL   nRBC 0.0 0.0 - 0.2 %   Neutrophils Relative % 81 %   Neutro Abs 13.5 (H) 1.7 - 7.7 K/uL   Lymphocytes Relative 8 %   Lymphs Abs 1.4 0.7 - 4.0 K/uL   Monocytes Relative 9 %   Monocytes Absolute 1.5 (H) 0.1 - 1.0 K/uL   Eosinophils Relative 1 %   Eosinophils Absolute 0.1 0.0 - 0.5 K/uL   Basophils Relative 0 %   Basophils Absolute 0.1 0.0 - 0.1 K/uL   Immature Granulocytes 1 %   Abs Immature Granulocytes 0.13 (H) 0.00 - 0.07 K/uL    Comment: Performed at [facility], [address], [place], [place] [address]  Resp  panel by RT-PCR (RSV, Flu A&B, Covid) Anterior Nasal Swab     Status: None   Collection Time: 11/06/22  2:36 PM   Specimen: Anterior Nasal Swab  Result Value Ref Range   SARS Coronavirus 2 by RT PCR NEGATIVE NEGATIVE    Comment: (NOTE) SARS-CoV-2 target nucleic acids are NOT DETECTED.  The SARS-CoV-2 RNA is  generally detectable in upper respiratory specimens during the acute phase of infection. The lowest concentration of SARS-CoV-2 viral copies this assay can detect is 138 copies/mL. A negative result does not preclude SARS-Cov-2 infection and should not be used as the sole basis for treatment or other patient management decisions. A negative result may occur with  improper specimen collection/handling, submission of specimen other than nasopharyngeal swab, presence of viral mutation(s) within the areas targeted by this assay, and inadequate number of viral copies(<138 copies/mL). A negative result must be combined with clinical observations, patient history, and epidemiological information. The expected result is Negative.  Fact Sheet for Patients:  BloggerCourse.com  Fact Sheet for Healthcare Providers:  SeriousBroker.it  This test is no t yet approved or cleared by the [place] FDA and  has been authorized for detection and/or diagnosis of SARS-CoV-2 by FDA under an Emergency Use Authorization (EUA). This EUA will remain  in effect (meaning this test can be used) for the duration of the COVID-19 declaration under Section 564(b)(1) of the Act, 21 U.S.C.section 360bbb-3(b)(1), unless the authorization is terminated  or revoked sooner.       Influenza A by PCR NEGATIVE NEGATIVE   Influenza B by PCR NEGATIVE NEGATIVE    Comment: (NOTE) The Xpert Xpress SARS-CoV-2/FLU/RSV plus assay is intended as an aid in the diagnosis of influenza from Nasopharyngeal swab specimens and should not be used as a sole basis for treatment.  Nasal washings and aspirates are unacceptable for Xpert Xpress SARS-CoV-2/FLU/RSV testing.  Fact Sheet for Patients: BloggerCourse.com  Fact Sheet for Healthcare Providers: SeriousBroker.it  This test is not yet approved or cleared by the [place] FDA and has been authorized for detection and/or diagnosis of SARS-CoV-2 by FDA under an Emergency Use Authorization (EUA). This EUA will remain in effect (meaning this test can be used) for the duration of the COVID-19 declaration under Section 564(b)(1) of the Act, 21 U.S.C. section 360bbb-3(b)(1), unless the authorization is terminated or revoked.     Resp Syncytial Virus by PCR NEGATIVE NEGATIVE    Comment: (NOTE) Fact Sheet for Patients: BloggerCourse.com  Fact Sheet for Healthcare Providers: SeriousBroker.it  This test is not yet approved or cleared by the [place] FDA and has been authorized for detection and/or diagnosis of SARS-CoV-2 by FDA under an Emergency Use Authorization (EUA). This EUA will remain in effect (meaning this test can be used) for the duration of the COVID-19 declaration under Section 564(b)(1) of the Act, 21 U.S.C. section 360bbb-3(b)(1), unless the authorization is terminated or revoked.  Performed at [facility], [address], [place], [place] [address]   I-Stat Lactic Acid     Status: Abnormal   Collection Time: 11/06/22  9:18 PM  Result Value Ref Range   Lactic Acid, Venous 2.0 (HH) 0.5 - 1.9 mmol/L  Troponin I (High Sensitivity)     Status: None   Collection Time: 11/06/22  9:20 PM  Result Value Ref Range   Troponin I (High Sensitivity) 16 <18 ng/L    Comment: (NOTE) Elevated high sensitivity troponin I (hsTnI) values and significant  changes across serial measurements may suggest ACS but many other  chronic and acute conditions are known to elevate hsTnI results.   Refer to the "Links" section for chest pain algorithms and additional  guidance. Performed at [facility], [address] [place], [place] [address]   I-Stat Lactic Acid     Status: None   Collection Time: 11/06/22 11:05 PM  Result Value Ref Range   Lactic Acid, Venous 1.3 0.5 - 1.9 mmol/L   DG Chest Port 1 View  Result Date: 11/06/2022 CLINICAL DATA:  Lymphedema, fatigue, shortness of breath. EXAM: PORTABLE CHEST 1 VIEW COMPARISON:  October 10, 2022 FINDINGS: The cardiac silhouette is borderline in size and unchanged in appearance. Stable, prominent bronchovascular lung markings are seen within the right lung base. There is no evidence of focal consolidation, overt pulmonary edema, pleural effusion or pneumothorax. Mild dextroscoliosis of the mid to lower thoracic spine is present. IMPRESSION: Borderline cardiomegaly without evidence of acute or active cardiopulmonary disease. Electronically Signed   By: Aram Candela M.D.   On: 11/06/2022 23:55   DG Foot Complete Left  Result Date: 11/06/2022 CLINICAL DATA:  Leg pain and swelling EXAM: LEFT FOOT - COMPLETE 3+ VIEW COMPARISON:  02/21/2021 FINDINGS: Limited by habitus and positioning. Bones appear demineralized. No definitive fracture or dislocation. Pes planus deformity with collapse of the arch. Small plantar calcaneal spur. Copious soft tissue edema IMPRESSION: No definite acute osseous abnormality allowing for limitations of habitus and positioning Electronically Signed   By: Jasmine Pang M.D.   On: 11/06/2022 22:20   DG Tibia/Fibula Left  Result Date: 11/06/2022 CLINICAL DATA:  Leg pain edema EXAM: LEFT TIBIA AND FIBULA - 2 VIEW COMPARISON:  None Available. FINDINGS: Considerable soft tissue edema. No fracture or malalignment. No osseous destructive change. IMPRESSION: Soft tissue edema.  No acute osseous abnormality Electronically Signed   By: Jasmine Pang M.D.   On: 11/06/2022 22:19    Pending Labs Unresulted  Labs (From admission, onward)     Start     Ordered   11/07/22 0052  HIV Antibody (routine testing w rflx)  (HIV Antibody (Routine testing w reflex) panel)  Once,   R        11/07/22 0051   11/06/22 2124  Urinalysis, Routine w reflex microscopic -Urine, Clean Catch  Once,   URGENT       Question:  Specimen Source  Answer:  Urine, Clean Catch   11/06/22 2123   11/06/22 1955  Culture, blood (routine x 2)  BLOOD CULTURE X 2,   R (with STAT occurrences)      11/06/22 1954            Vitals/Pain Today's Vitals   11/06/22 2100 11/06/22 2200 11/06/22 2212 11/07/22 0145  BP: 117/64 128/62  (!) 102/51  Pulse: 87 91  83  Resp:  16  17  Temp:   99.1 F (37.3 C) 98.3 F (36.8 C)  TempSrc:   Rectal   SpO2: 100% 100%  100%  Weight:      Height:      PainSc:        Isolation Precautions No active isolations  Medications Medications  apixaban (ELIQUIS) tablet 5 mg (has no administration in time range)  0.9 %  sodium chloride infusion (has no administration in time range)  acetaminophen (TYLENOL) tablet 650 mg (has no administration in time range)  oxyCODONE (Oxy IR/ROXICODONE) immediate release tablet 5 mg (has no administration in time range)  prochlorperazine (COMPAZINE) injection 5 mg (has no administration in time range)  melatonin tablet 5 mg (has no administration in time range)  polyethylene glycol (MIRALAX / GLYCOLAX) packet 17 g (has no administration in time range)  lactated ringers bolus 1,000 mL (1,000 mLs Intravenous New Bag/Given 11/06/22 2103)  clindamycin (CLEOCIN) IVPB 600 mg (600 mg Intravenous New Bag/Given 11/07/22 0058)    Mobility  non-ambulatory     Focused Assessments    R Recommendations: See Admitting Provider Note  Report given to:   Additional Notes:

## 2022-11-07 NOTE — Progress Notes (Signed)
Lower extremity venous left study completed.   Please see CV Proc for preliminary results.   Rachel Hodge, RDMS, RVT  

## 2022-11-07 NOTE — Hospital Course (Signed)
56yo with h/o morbid obesity with lymphedema, antiphospholipid syndrome, PE on Eliquis, and RA who presented on 9/9 with LLE pain and erythema, diagnosed with sepsis due to cellulitis.  She was started on Rocephin and Vancomycin.

## 2022-11-07 NOTE — ED Provider Notes (Signed)
Received at shift change from Wilfred Lacy, PA-C please note for full detail  In short with medical history including lymphedema, pneumonia, PE: Eliquis, RA, cellulitis, presenting with complaints of weakness, states it started yesterday, states that she has generalized weakness, difficulty walking, she states she mainly is have trouble walking her left leg due to pain, states she to be walk to the walker, she noticed redness of her leg yesterday and feels that it got worse.  Not endorsing current chest pain shortness of breath some pains nausea or vomiting.  Per previous provider patient would likely need to be admitted, follow-up on remaining lab workup and admit for likely cellulitis. Physical Exam  BP 128/62   Pulse 91   Temp 99.1 F (37.3 C) (Rectal)   Resp 16   Ht 5\' 9"  (1.753 m)   Wt (!) 152.4 kg   LMP 01/31/2011   SpO2 100%   BMI 49.62 kg/m   Physical Exam Vitals and nursing note reviewed.  Constitutional:      General: She is not in acute distress.    Appearance: She is not ill-appearing.  HENT:     Head: Normocephalic and atraumatic.     Nose: No congestion.  Eyes:     Conjunctiva/sclera: Conjunctivae normal.  Cardiovascular:     Rate and Rhythm: Normal rate and regular rhythm.     Pulses: Normal pulses.     Heart sounds: No murmur heard.    No friction rub. No gallop.  Pulmonary:     Effort: No respiratory distress.     Breath sounds: No wheezing, rhonchi or rales.  Musculoskeletal:     Right lower leg: Edema present.     Left lower leg: Edema present.     Comments: Bilateral lower limb edema, she has 1+ pain edema to the knees bilaterally, there is noted erythema tracking her left leg, just below her knee, she has 2+ dorsalis pulses, she is formation of motion in her toes ankle knees bilaterally.  Skin:    General: Skin is warm and dry.  Neurological:     Mental Status: She is alert.     Comments: No facial asymmetry no difficulty with word finding following  two-step commands there is no unilateral weakness present.  Psychiatric:        Mood and Affect: Mood normal.     Procedures  Procedures  ED Course / MDM    Medical Decision Making Amount and/or Complexity of Data Reviewed Labs: ordered. Radiology: ordered.  Risk Prescription drug management. Decision regarding hospitalization.   Lab Tests:  I Ordered, and personally interpreted labs.  The pertinent results include: CBC shows leukocytosis of 16.7, CMP reveals sodium 134 glucose 118 BUN 21, creatinine 1.29 total T. bili 3.5 GFR 49, initial lactic was 2 repeat lactic is 1.3 for troponin is 16   Imaging Studies ordered:  I ordered imaging studies including chest x-ray, DG of left foot tib-fib I independently visualized and interpreted imaging which showed all which are negative acute findings I agree with the radiologist interpretation   Cardiac Monitoring:  The patient was maintained on a cardiac monitor.  I personally viewed and interpreted the cardiac monitored which showed an underlying rhythm of: Without signs of ischemia   Medicines ordered and prescription drug management:  I ordered medication including ceftriaxone I have reviewed the patients home medicines and have made adjustments as needed  Critical Interventions:  N/A   Reevaluation:  Was reassessed she is resting comfortably she has  notable erythema in the left leg, suspect she is overlying cellulitis, she she was tachycardic elevated white count and has a downtrending lactic, will start her on IV antibiotics, and admit to medicine she is agreement this plan.  Consultations Obtained:  I requested consultation with the Dr. Margo Aye,  and discussed lab and imaging findings as well as pertinent plan - they recommend: Will admit the patient.    Test Considered:  N/A    Rule out I have low suspicion for ACS as history is atypical, patient has no cardiac history, EKG was sinus rhythm without signs of  ischemia, patient has a negative troponin.  Low suspicion for PE as patient vital signs reassuring nontachypneic nonhypoxic, presentation atypical etiology she is also on Eliquis making this less likely.  I doubt lower leg ischemia she is neurovascular tact in lower extremities.  Doubt CVA intracranial head bleed endorsing any headaches, change in vision, paresthesia or weakness upper extremities there is no focal deficit on my exam.     Dispostion and problem list  After consideration of the diagnostic results and the patients response to treatment, I feel that the patent would benefit from admission.  Cellulitis-currently on clindamycin due to allergies, patient will need admission for further observation and de-escalation of antibiotics.           Carroll Sage, PA-C 11/07/22 0051    Tilden Fossa, MD 11/07/22 0120

## 2022-11-07 NOTE — H&P (Addendum)
History and Physical  [name] UJW:[id number] DOB: 22-Aug-1965 DOA: 11/06/2022  Referring physician: Donnella [name]  PCP: Claiborne Rigg, NP  Outpatient Specialists: Podiatry. Patient coming from: Home.  Chief Complaint: Left leg pain.  HPI: [name] is a 57 y.o. female with medical history significant for severe morbid obesity, lymphedema, antiphospholipid syndrome, history of pulmonary embolism on Eliquis, rheumatoid arthritis, who presents to the ED due to 1 day duration of left lower extremity pain erythema warmth and edema.  She noticed these symptoms the morning prior to her presentation.  Admits to subjective fevers and chills.  In the ED, left lower extremity cellulitis noted on exam, tachycardic with pulse of 119, with leukocytosis of 16.7.  Empiric IV antibiotics, IV fluid and blood cultures ordered by EDP.  TRH, hospitalist service was asked to admit.  Admitted for sepsis secondary to left lower extremity cellulitis.  ED Course: Tmax 99.1.  BP 101/53, pulse 118 -->84, respiratory 14, saturation 98% on room air.  Lab studies notable for WBC 16.7, neutrophil count 13.5.  Serum sodium 134, creatinine 1.29, BUN 21, GFR 49.  Lactic acid 2.0, repeat 1.3.  Review of Systems: Review of systems as noted in the HPI. All other systems reviewed and are negative.   Past Medical History:  Diagnosis Date   Anemia    Anxiety    Arthritis    Asthma    hx as child - no prob as adult - no inhaler   Blood transfusion 01/22/11   transfusion 2 units at [facility]   Depression 08/2010   psych assessment   Dyspnea    Fibroid    Headache(784.0)    rx for imitrex - last one jan   Keloid    Lymphedema    Pulmonary embolism [facility])    Past Surgical History:  Procedure Laterality Date   ABDOMINAL HYSTERECTOMY     COLONOSCOPY N/A 11/08/2017   Procedure: COLONOSCOPY;  Surgeon: West Bali, MD;  Location: [facility];  Service: Endoscopy;  Laterality: N/A;  2:00pm   FLEXIBLE  SIGMOIDOSCOPY N/A 09/17/2017   Procedure: FLEXIBLE SIGMOIDOSCOPY;  Surgeon: West Bali, MD;  Location: [facility];  Service: Endoscopy;  Laterality: N/A;   HERNIA REPAIR  2009   umbilical   POLYPECTOMY  11/08/2017   Procedure: POLYPECTOMY;  Surgeon: West Bali, MD;  Location: [facility];  Service: Endoscopy;;  ascending colon (CSx1), transverse colon (CS x1), splenic flexure (HSx1)   SVD     Spontaneous vaginal delivery; x 1    Social History:  reports that she has never smoked. She has never used smokeless tobacco. She reports that she does not drink alcohol and does not use drugs.   Allergies  Allergen Reactions   Abatacept Anaphylaxis    Other reaction(s): Anaphylaxis-Streptomycin Clickject Other reaction(s): Anaphylaxis-Streptomycin   Bee Venom Anaphylaxis   Augmentin [Amoxicillin-Pot Clavulanate] Hives, Itching and Other (See Comments)    Did PCN reaction causing immediate rash, facial/tongue/throat swelling, SOB or lightheadedness with hypotension: yes Did PCN reaction causing severe rash involving mucus membranes or skin necrosis: no Has patient had a PCN reaction that required hospitalization: in hospital Has patient had a PCN reaction occurring within the last 10 years: no If all of the above answers are "NO", then may proceed with Cephalosporin use.  Pt reports no recollection of any reactions when taking penicillin in past   Latex Hives and Other (See Comments)    Local reaction (welts). Patient denies any  wheezing or other reaction with latex exposure   Nulytely [Peg 3350-Kcl-Na Bicarb-Nacl]     NAUSEA AND VOMITING. MAY TOLERATE LOW VOLUME PREP.    Family History  Problem Relation Age of Onset   Heart disease Mother    Hypertension Mother    Hypertension Father    Colon cancer Father 26       Passed away 44 yrs old   Hypertension Sister    Cancer Maternal Grandmother        gastric cancer   Cancer Maternal Grandfather        pancreatic cancer    Colon cancer Paternal Grandmother    Colon cancer Paternal Uncle    Colon polyps Neg Hx       Prior to Admission medications   Medication Sig Start Date End Date Taking? Authorizing Provider  albuterol (VENTOLIN HFA) 108 (90 Base) MCG/ACT inhaler Inhale 2 puffs into the lungs every 6 (six) hours as needed for wheezing or shortness of breath. 07/12/20   Claiborne Rigg, NP  apixaban (ELIQUIS) 5 MG TABS tablet Take 1 tablet (5 mg total) by mouth 2 (two) times daily. 07/07/22   Claiborne Rigg, NP  CIMZIA 2 X 200 MG/ML PSKT Inject into the skin every 14 (fourteen) days. 11/07/21   [provider]  clotrimazole (CLOTRIMAZOLE ANTI-FUNGAL) 1 % cream Apply 1 Application topically 2 (two) times daily. 03/06/22   Claiborne Rigg, NP  ergocalciferol (VITAMIN D2) 1.25 MG (50000 UT) capsule 1 capsule Orally q week for 30 days    [provider]  ezetimibe (ZETIA) 10 MG tablet Take 1 tablet (10 mg total) by mouth daily. For high cholesterol 10/11/22   Claiborne Rigg, NP  famotidine (PEPCID) 40 MG tablet Take 1 tablet (40 mg total) by mouth daily. 10/11/22   Claiborne Rigg, NP  folic acid (FOLVITE) 1 MG tablet Take 1 tablet by mouth once daily 07/26/22   Hoy Register, MD  losartan (COZAAR) 25 MG tablet Take 1 tablet by mouth once daily 10/20/22   Orbie Pyo, MD  meclizine (ANTIVERT) 12.5 MG tablet Take 1-2 tablets (12.5-25 mg total) by mouth 3 (three) times daily as needed for dizziness. 10/11/22   Claiborne Rigg, NP  methotrexate (50 MG/ML) 1 g injection Inject into the vein once.    [provider]  Methotrexate Sodium (METHOTREXATE, PF,) 250 MG/10ML injection Inject 0.8 mg into the muscle.    [provider]  Misc. Devices (BARIATRIC ROLLATOR) MISC Please provide patient with insurance approved bariatric rollator walker. I89.0, R53.81, R26.81 11/05/19   Claiborne Rigg, NP    Physical Exam: BP 128/62   Pulse 91   Temp 99.1 F (37.3 C) (Rectal)   Resp 16    Ht 5\' 9"  (1.753 m)   Wt (!) 152.4 kg   LMP 01/31/2011   SpO2 100%   BMI 49.62 kg/m   General: 57 y.o. year-old female well developed well nourished in no acute distress.  Alert and oriented x3. Cardiovascular: Regular rate and rhythm with no rubs or gallops.  No thyromegaly or JVD noted.  Lymphedema involving lower extremities. Respiratory: Clear to auscultation with no wheezes or rales. Good inspiratory effort. Abdomen: Soft nontender nondistended with normal bowel sounds x4 quadrants. Muskuloskeletal: Lymphedema involving lower extremities. Neuro: CN II-XII intact, strength, sensation, reflexes Skin: Left lower extremity, erythematous, edematous, warm, and tender to palpation. Psychiatry: Judgement and insight appear normal. Mood is appropriate for condition and setting  Labs on Admission:  Basic Metabolic Panel: Recent Labs  Lab 11/06/22 1436  NA 134*  K 4.1  CL 100  CO2 24  GLUCOSE 118*  BUN 21*  CREATININE 1.29*  CALCIUM 9.3   Liver Function Tests: Recent Labs  Lab 11/06/22 1436  AST 13*  ALT 21  ALKPHOS 52  BILITOT 3.5*  PROT 8.0  ALBUMIN 3.9   No results for input(s): "LIPASE", "AMYLASE" in the last 168 hours. No results for input(s): "AMMONIA" in the last 168 hours. CBC: Recent Labs  Lab 11/06/22 1436  WBC 16.7*  NEUTROABS 13.5*  HGB 13.0  HCT 41.5  MCV 93.5  PLT 225   Cardiac Enzymes: No results for input(s): "CKTOTAL", "CKMB", "CKMBINDEX", "TROPONINI" in the last 168 hours.  BNP (last 3 results) No results for input(s): "BNP" in the last 8760 hours.  ProBNP (last 3 results) No results for input(s): "PROBNP" in the last 8760 hours.  CBG: No results for input(s): "GLUCAP" in the last 168 hours.  Radiological Exams on Admission: DG Chest Port 1 View  Result Date: 11/06/2022 CLINICAL DATA:  Lymphedema, fatigue, shortness of breath. EXAM: PORTABLE CHEST 1 VIEW COMPARISON:  October 10, 2022 FINDINGS: The cardiac silhouette is borderline  in size and unchanged in appearance. Stable, prominent bronchovascular lung markings are seen within the right lung base. There is no evidence of focal consolidation, overt pulmonary edema, pleural effusion or pneumothorax. Mild dextroscoliosis of the mid to lower thoracic spine is present. IMPRESSION: Borderline cardiomegaly without evidence of acute or active cardiopulmonary disease. Electronically Signed   By: Aram Candela M.D.   On: 11/06/2022 23:55   DG Foot Complete Left  Result Date: 11/06/2022 CLINICAL DATA:  Leg pain and swelling EXAM: LEFT FOOT - COMPLETE 3+ VIEW COMPARISON:  02/21/2021 FINDINGS: Limited by habitus and positioning. Bones appear demineralized. No definitive fracture or dislocation. Pes planus deformity with collapse of the arch. Small plantar calcaneal spur. Copious soft tissue edema IMPRESSION: No definite acute osseous abnormality allowing for limitations of habitus and positioning Electronically Signed   By: Jasmine Pang M.D.   On: 11/06/2022 22:20   DG Tibia/Fibula Left  Result Date: 11/06/2022 CLINICAL DATA:  Leg pain edema EXAM: LEFT TIBIA AND FIBULA - 2 VIEW COMPARISON:  None Available. FINDINGS: Considerable soft tissue edema. No fracture or malalignment. No osseous destructive change. IMPRESSION: Soft tissue edema.  No acute osseous abnormality Electronically Signed   By: Jasmine Pang M.D.   On: 11/06/2022 22:19    EKG: I independently viewed the EKG done and my findings are as followed: Sinus rhythm rate of 88.  Nonspecific ST-T changes.  QTc 451.  Assessment/Plan Present on Admission:  Cellulitis  Principal Problem:   Cellulitis  Sepsis secondary to left lower extremity cellulitis, POA Presented with tachycardia, leukocytosis, left lower extremity cellulitis Monitor fever curve and WBCs Maintain MAP greater than 65 Continue IV antibiotics initiated in the ED, received IV clindamycin due to allergy to penicillin IV vancomycin and Rocephin. Follow  peripheral blood cultures x 2 Gentle IV fluid hydration NS at 75 cc/h x 2 days.  AKI, prerenal secondary to dehydration Baseline creatinine 0.9 with GFR of greater than 60 Presented with creatinine of 1.29 with GFR 49 Avoid nephrotoxic agents, dehydration and hypotension IV fluid hydration NS at 75 cc/h x 2 days Monitor urine output Repeat chemistry panel in the morning.  History of hypertension Currently blood pressures are soft in the setting of sepsis Hold off home losartan Maintain MAP  greater than 65 Midodrine as needed to maintain MAP above 65. Closely monitor vital signs  History of pulmonary embolism on Eliquis Resume home Eliquis  Rheumatoid arthritis Resume home regimen  Hyperlipidemia GERD Resume home regimen  Physical debility PT OT assessment Fall precautions.  Isolated elevated T. Bili T. bili 3.5 Nonspecific Monitor and trend   Time: 75 minutes.   DVT prophylaxis: Home Eliquis.  Code Status: Full code.  Family Communication: None at bedside.  Disposition Plan: Admitted to telemetry surgical unit.  Consults called: None.  Admission status: Inpatient status.   Status is: Inpatient The patient requires at least 2 midnights for further evaluation and treatment of present condition.   Darlin Drop MD [facility] Pager [phone]  If 7PM-7AM, please contact night-coverage [email or web address] Password [facility]  11/07/2022, 12:52 AM

## 2022-11-07 NOTE — Progress Notes (Signed)
Pharmacy Antibiotic Note  [name] is a 57 y.o. female admitted on 11/06/2022 with cellulitis.  Pharmacy has been consulted for Vancomycin  dosing.  Plan: Vancomycin 2000 mg IV now, then Vancomycin 1250 mg IV q12h  Height: 5\' 9"  (175.3 cm) Weight: (!) 152.4 kg (336 lb) IBW/kg (Calculated) : 66.2  Temp (24hrs), Avg:98.6 F (37 C), Min:98.3 F (36.8 C), Max:99.1 F (37.3 C)  Recent Labs  Lab 11/06/22 1436 11/06/22 2118 11/06/22 2305  WBC 16.7*  --   --   CREATININE 1.29*  --   --   LATICACIDVEN  --  2.0* 1.3    Estimated Creatinine Clearance: 79.3 mL/min (A) (by C-G formula based on SCr of 1.29 mg/dL (H)).    Allergies  Allergen Reactions   Abatacept Anaphylaxis    Other reaction(s): Anaphylaxis-Streptomycin Clickject Other reaction(s): Anaphylaxis-Streptomycin   Bee Venom Anaphylaxis   Augmentin [Amoxicillin-Pot Clavulanate] Hives, Itching and Other (See Comments)    Did PCN reaction causing immediate rash, facial/tongue/throat swelling, SOB or lightheadedness with hypotension: yes Did PCN reaction causing severe rash involving mucus membranes or skin necrosis: no Has patient had a PCN reaction that required hospitalization: in hospital Has patient had a PCN reaction occurring within the last 10 years: no If all of the above answers are "NO", then may proceed with Cephalosporin use.  Pt reports no recollection of any reactions when taking penicillin in past   Latex Hives and Other (See Comments)    Local reaction (welts). Patient denies any wheezing or other reaction with latex exposure   Nulytely [Peg 3350-Kcl-Na Bicarb-Nacl]     NAUSEA AND VOMITING. MAY TOLERATE LOW VOLUME PREP.     Eddie Candle 11/07/2022 5:51 AM

## 2022-11-07 NOTE — Evaluation (Signed)
Physical Therapy Evaluation Patient Details Name: [name] MRN: [id number] DOB: 10-Oct-1965 Today's Date: 11/07/2022  History of Present Illness  57 y.o. female who presents to the ED from PT appointment due to 1 day duration of left lower extremity pain erythema warmth and edema. PMH: severe morbid obesity, lymphedema, antiphospholipid syndrome, history of pulmonary embolism on Eliquis, rheumatoid arthritis,  Clinical Impression  PTA pt living in level entry apartment, independent in ambulation limited community distances with Rollator and taking bird baths until new walk in shower installed, but independent in ADLs, and iADLs. Daughter does drive her to appointments and shopping. Pt is currently limited in safe mobility by pain in L LE, low BP (see General Comments) alternating between feeling hot and cold in presence of decreased strength and endurance. Pt is mod I for bed mobility and transfers and supervision for short distance ambulation. PT recommending Outpatient PT at discharge prior to transition back to going to gym. Pt referred to Mobility Specialist. PT will continue to follow acutely.       If plan is discharge home, recommend the following: Assistance with cooking/housework;Assist for transportation   Can travel by private vehicle    Yes    Equipment Recommendations None recommended by PT     Functional Status Assessment Patient has had a recent decline in their functional status and demonstrates the ability to make significant improvements in function in a reasonable and predictable amount of time.     Precautions / Restrictions Precautions Precautions: None Restrictions Weight Bearing Restrictions: No      Mobility  Bed Mobility Overal bed mobility: Modified Independent             General bed mobility comments: able to roll on and off bed pan with use of bedrail, as well as getting in and out of flattened bed    Transfers Overall transfer level:  Modified independent Equipment used: Rollator (4 wheels)               General transfer comment: increased effort but able to come to standing from low bed to her Rollator    Ambulation/Gait Ambulation/Gait assistance: Supervision Gait Distance (Feet): 18 Feet Assistive device: Rollator (4 wheels) Gait Pattern/deviations: Step-through pattern, Shuffle, Trunk flexed Gait velocity: slowed Gait velocity interpretation: <1.8 ft/sec, indicate of risk for recurrent falls   General Gait Details: supervision for safety with slowed, shuffling gait with increased BoS, pt with reports of increased L LE pain with movement      Balance Overall balance assessment: Mild deficits observed, not formally tested                                           Pertinent Vitals/Pain Pain Assessment Pain Assessment: 0-10 Pain Score: 4  Pain Location: L LE Pain Descriptors / Indicators: Burning Pain Intervention(s): Limited activity within patient's tolerance, Monitored during session, Repositioned    Home Living Family/patient expects to be discharged to:: Private residence Living Arrangements: Alone Available Help at Discharge: Family;Available PRN/intermittently Type of Home: Apartment Home Access: Level entry         Home Equipment: Rollator (4 wheels)      Prior Function               Mobility Comments: household distances daily, limited community distances when feeling well ADLs Comments: bird baths because difficulty with getting into tub shower, manages  medications, daughter takes her on errands     Extremity/Trunk Assessment   Upper Extremity Assessment Upper Extremity Assessment: Defer to OT evaluation    Lower Extremity Assessment Lower Extremity Assessment: RLE deficits/detail;LLE deficits/detail RLE Deficits / Details: ROM limited by body habitus and lymphedema, strength grossly 4/5 RLE Sensation: WNL RLE Coordination: decreased fine motor LLE  Deficits / Details: ROM limited by body habitus and lymphedema, strength grossly 4/5 LLE Sensation: WNL LLE Coordination: decreased fine motor    Cervical / Trunk Assessment Cervical / Trunk Assessment: Normal  Communication   Communication Communication: No apparent difficulties  Cognition Arousal: Alert Behavior During Therapy: WFL for tasks assessed/performed Overall Cognitive Status: Within Functional Limits for tasks assessed                                          General Comments General comments (skin integrity, edema, etc.): BP 103/48 in supine, 100/78 after using bed pan, 132/68 in seated, 118/75 after ambulation     Assessment/Plan    PT Assessment Patient needs continued PT services  PT Problem List Decreased range of motion;Decreased activity tolerance;Decreased mobility;Cardiopulmonary status limiting activity;Pain;Decreased skin integrity       PT Treatment Interventions DME instruction;Gait training;Functional mobility training;Therapeutic activities;Therapeutic exercise;Balance training;Cognitive remediation;Patient/family education    PT Goals (Current goals can be found in the Care Plan section)  Acute Rehab PT Goals Patient Stated Goal: get back to the gym PT Goal Formulation: With patient Time For Goal Achievement: 11/21/22 Potential to Achieve Goals: Good    Frequency Min 1X/week        AM-PAC PT "6 Clicks" Mobility  Outcome Measure Help needed turning from your back to your side while in a flat bed without using bedrails?: None Help needed moving from lying on your back to sitting on the side of a flat bed without using bedrails?: None Help needed moving to and from a bed to a chair (including a wheelchair)?: None Help needed standing up from a chair using your arms (e.g., wheelchair or bedside chair)?: None Help needed to walk in hospital room?: A Little Help needed climbing 3-5 steps with a railing? : A Little 6 Click Score:  22    End of Session   Activity Tolerance: Patient limited by pain Patient left: in bed;with call bell/phone within reach;with bed alarm set Nurse Communication: Mobility status PT Visit Diagnosis: Muscle weakness (generalized) (M62.81);Pain Pain - Right/Left: Left Pain - part of body: Leg    Time: 7829-5621 PT Time Calculation (min) (ACUTE ONLY): 44 min   Charges:   PT Evaluation $PT Eval Low Complexity: 1 Low PT Treatments $Gait Training: 8-22 mins $Therapeutic Activity: 8-22 mins PT General Charges $$ ACUTE PT VISIT: 1 Visit         Vanecia Limpert B. Beverely Risen PT, DPT Acute Rehabilitation Services Please use secure chat or  Call Office [phone]   Elon Alas [facility] 11/07/2022, 9:58 AM

## 2022-11-07 NOTE — Evaluation (Signed)
Occupational Therapy Evaluation Patient Details Name: [name] MRN: [id number] DOB: 02/22/1966 Today's Date: 11/07/2022   History of Present Illness 57 y.o. female who presents to the ED from PT appointment due to 1 day duration of left lower extremity pain erythema warmth and edema. PMH: severe morbid obesity, lymphedema, antiphospholipid syndrome, history of pulmonary embolism on Eliquis, rheumatoid arthritis,   Clinical Impression   Patient reporting that she lives alone in 1 level apartment with elevator access and uses rollator for functional mobility and mod I level and is Independent for ADLs and IADLs with daughter transporting her to MD appointments and to get groceries.  Patient completed bed mobility with mod I with HOB elevated and requires max A for doning L sock while seated EOB.  Patient able to complete sit to stand and functional mobility into bathroom and complete toileting task with mod I using rollator and BSC over commode for elevated surface.  Patient's BP post activity and seated in chair 133/67.  Patient would benefit from additional OT intervention to address functional deficits  of activity tolerance, strength and LB ADLs.      If plan is discharge home, recommend the following: A little help with bathing/dressing/bathroom;Assist for transportation;Help with stairs or ramp for entrance;Assistance with cooking/housework    Functional Status Assessment  Patient has had a recent decline in their functional status and demonstrates the ability to make significant improvements in function in a reasonable and predictable amount of time.  Equipment Recommendations  BSC/3in1 (patient reporting that she needs a new BSC)    Recommendations for Other Services       Precautions / Restrictions Precautions Precautions: None Restrictions Weight Bearing Restrictions: No      Mobility Bed Mobility Overal bed mobility: Modified Independent                   Transfers Overall transfer level: Modified independent Equipment used: Rollator (4 wheels)                      Balance                                           ADL either performed or assessed with clinical judgement   ADL Overall ADL's : Needs assistance/impaired Eating/Feeding: Independent;Sitting   Grooming: Wash/dry hands;Supervision/safety;Standing   Upper Body Bathing: Sitting;Set up   Lower Body Bathing: Moderate assistance   Upper Body Dressing : Set up;Sitting   Lower Body Dressing: Total assistance (donning R sock)   Toilet Transfer: BSC/3in1;Rollator (4 wheels);Modified Independent (over toilet for elevated surface)   Toileting- Clothing Manipulation and Hygiene: Supervision/safety       Functional mobility during ADLs: Contact guard assist;Rollator (4 wheels)       Vision Baseline Vision/History: 0 No visual deficits Patient Visual Report: No change from baseline       Perception         Praxis         Pertinent Vitals/Pain Pain Assessment Pain Assessment: No/denies pain Pain Score: 3  Pain Location: L LE Pain Descriptors / Indicators: Burning Pain Intervention(s): Monitored during session     Extremity/Trunk Assessment Upper Extremity Assessment Upper Extremity Assessment: Overall WFL for tasks assessed   Lower Extremity Assessment Lower Extremity Assessment: Defer to PT evaluation   Cervical / Trunk Assessment Cervical / Trunk Assessment: Normal   Communication  Communication Communication: No apparent difficulties   Cognition Arousal: Alert Behavior During Therapy: WFL for tasks assessed/performed Overall Cognitive Status: Within Functional Limits for tasks assessed                                       General Comments   (supine BP107/52 ;in chair 133/67)    Exercises     Shoulder Instructions      Home Living Family/patient expects to be discharged to:: Private  residence Living Arrangements: Alone Available Help at Discharge: Family;Available PRN/intermittently Type of Home: Apartment Home Access: Level entry     Home Layout: One level     Bathroom Shower/Tub: Tub/shower unit;Sponge bathes at baseline   Bathroom Toilet: Standard Bathroom Accessibility: Yes How Accessible: Accessible via walker Home Equipment: Rollator (4 wheels);BSC/3in1;Shower seat          Prior Functioning/Environment Prior Level of Function : Independent/Modified Independent             Mobility Comments: household distances daily, limited community distances when feeling well ADLs Comments: bird baths because difficulty with getting into tub shower, manages medications, daughter takes her on errands        OT Problem List: Decreased strength;Decreased activity tolerance      OT Treatment/Interventions: Self-care/ADL training;Therapeutic exercise;Neuromuscular education;Therapeutic activities    OT Goals(Current goals can be found in the care plan section) Acute Rehab OT Goals OT Goal Formulation: With patient Time For Goal Achievement: 11/21/22 Potential to Achieve Goals: Good  OT Frequency: Min 1X/week    Co-evaluation              AM-PAC OT "6 Clicks" Daily Activity     Outcome Measure Help from another person eating meals?: None Help from another person taking care of personal grooming?: None Help from another person toileting, which includes using toliet, bedpan, or urinal?: A Little Help from another person bathing (including washing, rinsing, drying)?: A Little Help from another person to put on and taking off regular upper body clothing?: None Help from another person to put on and taking off regular lower body clothing?: A Little 6 Click Score: 21   End of Session Equipment Utilized During Treatment: Rollator (4 wheels) Nurse Communication: Mobility status  Activity Tolerance: Patient tolerated treatment well Patient left: in  chair;with call bell/phone within reach  OT Visit Diagnosis: Muscle weakness (generalized) (M62.81);Unsteadiness on feet (R26.81)                Time: 1610-9604 OT Time Calculation (min): 32 min Charges:  OT General Charges $OT Visit: 1 Visit OT Evaluation $OT Eval Moderate Complexity: 1 Mod OT Treatments $Self Care/Home Management : 8-22 mins  Governor Specking OT/L  Denice Paradise 11/07/2022, 12:32 PM

## 2022-11-08 ENCOUNTER — Inpatient Hospital Stay (HOSPITAL_COMMUNITY): Payer: Medicare HMO

## 2022-11-08 ENCOUNTER — Encounter: Payer: Medicare HMO | Admitting: Occupational Therapy

## 2022-11-08 DIAGNOSIS — R17 Unspecified jaundice: Secondary | ICD-10-CM | POA: Diagnosis not present

## 2022-11-08 DIAGNOSIS — I10 Essential (primary) hypertension: Secondary | ICD-10-CM | POA: Diagnosis not present

## 2022-11-08 DIAGNOSIS — L0390 Cellulitis, unspecified: Secondary | ICD-10-CM | POA: Diagnosis not present

## 2022-11-08 DIAGNOSIS — R011 Cardiac murmur, unspecified: Secondary | ICD-10-CM | POA: Diagnosis not present

## 2022-11-08 DIAGNOSIS — A419 Sepsis, unspecified organism: Principal | ICD-10-CM

## 2022-11-08 LAB — COMPREHENSIVE METABOLIC PANEL
ALT: 21 U/L (ref 0–44)
AST: 17 U/L (ref 15–41)
Albumin: 2.6 g/dL — ABNORMAL LOW (ref 3.5–5.0)
Alkaline Phosphatase: 37 U/L — ABNORMAL LOW (ref 38–126)
Anion gap: 7 (ref 5–15)
BUN: 21 mg/dL — ABNORMAL HIGH (ref 6–20)
CO2: 21 mmol/L — ABNORMAL LOW (ref 22–32)
Calcium: 8 mg/dL — ABNORMAL LOW (ref 8.9–10.3)
Chloride: 106 mmol/L (ref 98–111)
Creatinine, Ser: 1.11 mg/dL — ABNORMAL HIGH (ref 0.44–1.00)
GFR, Estimated: 59 mL/min — ABNORMAL LOW (ref >60)
Glucose, Bld: 120 mg/dL — ABNORMAL HIGH (ref 70–99)
Potassium: 4.1 mmol/L (ref 3.5–5.1)
Sodium: 134 mmol/L — ABNORMAL LOW (ref 135–145)
Total Bilirubin: 1.5 mg/dL — ABNORMAL HIGH (ref 0.3–1.2)
Total Protein: 5.8 g/dL — ABNORMAL LOW (ref 6.5–8.1)

## 2022-11-08 LAB — ECHOCARDIOGRAM COMPLETE
AV Mean grad: 9 mmHg
AV Peak grad: 16.5 mmHg
Ao pk vel: 2.03 m/s
Area-P 1/2: 3.73 cm2
Calc EF: 72.9 %
Height: 69 in
S' Lateral: 2.4 cm
Single Plane A2C EF: 75.6 %
Single Plane A4C EF: 67.7 %
Weight: 5376 [oz_av]

## 2022-11-08 LAB — CBC WITH DIFFERENTIAL/PLATELET
Abs Immature Granulocytes: 0.1 10*3/uL — ABNORMAL HIGH (ref 0.00–0.07)
Basophils Absolute: 0.1 10*3/uL (ref 0.0–0.1)
Basophils Relative: 1 %
Eosinophils Absolute: 0.4 10*3/uL (ref 0.0–0.5)
Eosinophils Relative: 4 %
HCT: 30 % — ABNORMAL LOW (ref 36.0–46.0)
Hemoglobin: 9.5 g/dL — ABNORMAL LOW (ref 12.0–15.0)
Immature Granulocytes: 1 %
Lymphocytes Relative: 13 %
Lymphs Abs: 1.3 10*3/uL (ref 0.7–4.0)
MCH: 29.9 pg (ref 26.0–34.0)
MCHC: 31.7 g/dL (ref 30.0–36.0)
MCV: 94.3 fL (ref 80.0–100.0)
Monocytes Absolute: 1.7 10*3/uL — ABNORMAL HIGH (ref 0.1–1.0)
Monocytes Relative: 16 %
Neutro Abs: 6.7 10*3/uL (ref 1.7–7.7)
Neutrophils Relative %: 65 %
Platelets: 175 10*3/uL (ref 150–400)
RBC: 3.18 MIL/uL — ABNORMAL LOW (ref 3.87–5.11)
RDW: 16.6 % — ABNORMAL HIGH (ref 11.5–15.5)
WBC: 10.2 10*3/uL (ref 4.0–10.5)
nRBC: 0 % (ref 0.0–0.2)

## 2022-11-08 NOTE — Plan of Care (Signed)

## 2022-11-08 NOTE — Progress Notes (Signed)
  Echocardiogram 2D Echocardiogram has been performed.  Janalyn Harder 11/08/2022, 2:33 PM

## 2022-11-08 NOTE — Progress Notes (Signed)
Occupational Therapy Treatment and Discharge Patient Details Name: [name] MRN: [id number] DOB: [birth year] Today's Date: 11/08/2022   History of present illness 57 y.o. female who presents to the ED from PT appointment due to 1 day duration of left lower extremity pain erythema warmth and edema. PMH: severe morbid obesity, lymphedema, antiphospholipid syndrome, history of pulmonary embolism on Eliquis, rheumatoid arthritis,   OT comments  Pt demonstrated ability to don stockinette on L foot, don velcro shoes, toilet and groom in standing. She reports sponge bathing independently at sink this morning. Pt ambulated in hall mod I with her rollator. No further OT needs. Encouraged continued ambulation and ADLs.       If plan is discharge home, recommend the following:  Assist for transportation;Help with stairs or ramp for entrance   Equipment Recommendations  BSC/3in1 (bariatric)    Recommendations for Other Services      Precautions / Restrictions Precautions Precautions: None Restrictions Weight Bearing Restrictions: No       Mobility Bed Mobility               General bed mobility comments: in chair    Transfers Overall transfer level: Modified independent Equipment used: Rollator (4 wheels)                     Balance Overall balance assessment: Mild deficits observed, not formally tested                                         ADL either performed or assessed with clinical judgement   ADL Overall ADL's : Modified independent                                            Extremity/Trunk Assessment              Vision       Perception     Praxis      Cognition Arousal: Alert Behavior During Therapy: WFL for tasks assessed/performed Overall Cognitive Status: Within Functional Limits for tasks assessed                                          Exercises      Shoulder  Instructions       General Comments      Pertinent Vitals/ Pain       Pain Assessment Pain Assessment: No/denies pain  Home Living                                          Prior Functioning/Environment              Frequency           Progress Toward Goals  OT Goals(current goals can now be found in the care plan section)  Progress towards OT goals: Goals met/education completed, patient discharged from OT     Plan      Co-evaluation                 AM-PAC OT "6 Clicks" Daily Activity  Outcome Measure   Help from another person eating meals?: None Help from another person taking care of personal grooming?: None Help from another person toileting, which includes using toliet, bedpan, or urinal?: None Help from another person bathing (including washing, rinsing, drying)?: None Help from another person to put on and taking off regular upper body clothing?: None Help from another person to put on and taking off regular lower body clothing?: None 6 Click Score: 24    End of Session Equipment Utilized During Treatment: Rollator (4 wheels)  OT Visit Diagnosis: Unsteadiness on feet (R26.81)   Activity Tolerance Patient tolerated treatment well   Patient Left in chair;with call bell/phone within reach   Nurse Communication          Time: 6578-4696 OT Time Calculation (min): 55 min  Charges: OT General Charges $OT Visit: 1 Visit OT Treatments $Self Care/Home Management : 53-67 mins  Berna Spare, OTR/L Acute Rehabilitation Services Office: [phone]   Evern Bio 11/08/2022, 11:11 AM

## 2022-11-08 NOTE — Progress Notes (Signed)
   11/08/22 1610  Mobility  Activity Refused mobility  Mobility Specialist Start Time (ACUTE ONLY) 1552  Mobility Specialist Stop Time (ACUTE ONLY) 1554  Mobility Specialist Time Calculation (min) (ACUTE ONLY) 2 min   Mobility Specialist: Progress Note  Pt refused mobility session d/t not feeling good. Pt stated she ambulated earlier with nursing.   Received and left in bed with all needs met. Call bell within reach.   Barnie Mort, BS Mobility Specialist Please contact via SecureChat or Rehab office at [phone].

## 2022-11-08 NOTE — Progress Notes (Addendum)
[facility] PROGRESS NOTE   SHAKIERRA STRIKE UJW:[id number] DOB: January 20, 1967 DOA: 11/06/2022  PCP: Claiborne Rigg, NP  Brief History/Interval Summary: [phone] with h/o morbid obesity with lymphedema, antiphospholipid syndrome, PE on Eliquis, and RA who presented on 9/9 with LLE pain and erythema, diagnosed with sepsis due to cellulitis. She was started on Rocephin and Vancomycin.   Consultants: None  Procedures: None    Subjective/Interval History: Patient mentions that the left leg is feeling better.  Less red and discoloration.  Less warm to touch.  Denies any pain.  Has been able to move around inside the room.    Assessment/Plan:  Left lower extremity cellulitis/sepsis, present on admission  SIRS criteria in this patient includes: Leukocytosis, tachycardia  Patient has evidence of acute organ failure with recurrent hypotension (SBP < 90 or MAP < 65 x 2 readings) that is not easily explained by another condition. Patient with h/o severe lymphedema of BLE Patient noted to have erythema to the left lower extremity.  Presented with elevated WBC. Patient was given vancomycin and ceftriaxone in the emergency department.  Continued on the same.  WBC is improving.  Clinically she appears to be improving.  Continue IV antibiotics for another 24 hours and then consider de-escalation tomorrow depending on clinical status.   Blood cultures are negative so far. Doppler studies are negative for DVT.  AKI, prerenal secondary to dehydration Baseline creatinine 0.9 with GFR of greater than 60. Noted to have worsening renal failure with rising creatinine to 1.66.  Improved this morning to 1.11.  Continue IV hydration for another 24 hours.  Lymphedema Significant baseline lymphedema with ambulatory dysfunction   Murmur Significant murmur appreciated on today's exam Prior echo in 03/2021 without apparent source Echocardiogram to be repeated.  Essential  hypertension Antihypertensives currently on hold due to borderline pressures.  Normocytic anemia Drop in hemoglobin likely dilutional.  No evidence of overt bleeding.   History of pulmonary embolism on Eliquis Continue Eliquis.     Rheumatoid arthritis Hold methotrexate, Cimzia   GERD Resume famotidine   Physical debility PT OT assessment Fall precautions.   Isolated elevated T. Bili T. bili 3.5 on presentation.  Possibly due to sepsis.  Improvement noted.  Morbid obesity Estimated body mass index is 49.62 kg/m as calculated from the following:   Height as of this encounter: 5\' 9"  (1.753 m).   Weight as of this encounter: 152.4 kg.  DVT Prophylaxis: On Eliquis Code Status: Full code Family Communication: Discussed with patient Disposition Plan: Anticipate discharge in 24 to 48 hours.     Medications: Scheduled:  apixaban  5 mg Oral BID   ezetimibe  10 mg Oral Daily   famotidine  40 mg Oral Daily   folic acid  1 mg Oral Daily   [START ON 11/11/2022] Vitamin D (Ergocalciferol)  50,000 Units Oral Q Sat   Continuous:  sodium chloride 75 mL/hr at 11/07/22 1435   cefTRIAXone (ROCEPHIN)  IV 2 g (11/07/22 0625)   vancomycin 1,250 mg (11/08/22 0846)   HYQ:MVHQIONGEXBMW, albuterol, melatonin, midodrine, oxyCODONE, polyethylene glycol, prochlorperazine  Antibiotics: Anti-infectives (From admission, onward)    Start     Dose/Rate Route Frequency Ordered Stop   11/07/22 2200  vancomycin (VANCOREADY) IVPB 1250 mg/250 mL        1,250 mg 166.7 mL/hr over 90 Minutes Intravenous Every 12 hours 11/07/22 0552     11/07/22 0645  vancomycin (VANCOREADY) IVPB 2000 mg/400 mL        2,000  mg 200 mL/hr over 120 Minutes Intravenous  Once 11/07/22 0552 11/07/22 1036   11/07/22 0600  cefTRIAXone (ROCEPHIN) 2 g in sodium chloride 0.9 % 100 mL IVPB        2 g 200 mL/hr over 30 Minutes Intravenous Daily 11/07/22 0542     11/06/22 2330  clindamycin (CLEOCIN) IVPB 600 mg        600  mg 100 mL/hr over 30 Minutes Intravenous  Once 11/06/22 2322 11/07/22 0128       Objective:  Vital Signs  Vitals:   11/07/22 1521 11/07/22 2007 11/08/22 0422 11/08/22 0801  BP: 126/68 130/62 (!) 118/55 111/66  Pulse: 86 94 94 89  Resp: 16 18 16 18   Temp: 98.7 F (37.1 C) 99.3 F (37.4 C)  99 F (37.2 C)  TempSrc: Oral Oral  Oral  SpO2: 100% 100% 99% 97%  Weight:      Height:        Intake/Output Summary (Last 24 hours) at 11/08/2022 0931 Last data filed at 11/07/2022 1512 Gross per 24 hour  Intake 624.55 ml  Output --  Net 624.55 ml   Filed Weights   11/06/22 1404  Weight: (!) 152.4 kg    General appearance: Awake alert.  In no distress Resp: Clear to auscultation bilaterally.  Normal effort Cardio: S1-S2 is normal regular.  No S3-S4.  No rubs murmurs or bruit GI: Abdomen is soft.  Nontender nondistended.  Bowel sounds are present normal.  No masses organomegaly Extremities: Lymphedematous changes noted in both lower extremities.  Erythema noted in the left leg along with increased warmth to touch. Neurologic: Alert and oriented x3.  No focal neurological deficits.    Lab Results:  Data Reviewed: I have personally reviewed following labs and reports of the imaging studies  CBC: Recent Labs  Lab 11/06/22 1436 11/07/22 0816 11/08/22 0350  WBC 16.7* 16.2* 10.2  NEUTROABS 13.5*  --  6.7  HGB 13.0 9.7* 9.5*  HCT 41.5 30.6* 30.0*  MCV 93.5 92.2 94.3  PLT 225 167 175    Basic Metabolic Panel: Recent Labs  Lab 11/06/22 1436 11/07/22 0816 11/08/22 0350  NA 134* 134* 134*  K 4.1 3.5 4.1  CL 100 103 106  CO2 24 24 21*  GLUCOSE 118* 133* 120*  BUN 21* 26* 21*  CREATININE 1.29* 1.66* 1.11*  CALCIUM 9.3 8.2* 8.0*  MG  --  2.2  --   PHOS  --  2.6  --     GFR: Estimated Creatinine Clearance: 92.1 mL/min (A) (by C-G formula based on SCr of 1.11 mg/dL (H)).  Liver Function Tests: Recent Labs  Lab 11/06/22 1436 11/07/22 0816 11/08/22 0350  AST 13*  11* 17  ALT 21 17 21   ALKPHOS 52 36* 37*  BILITOT 3.5* 2.3* 1.5*  PROT 8.0 6.0* 5.8*  ALBUMIN 3.9 2.6* 2.6*     Recent Results (from the past 240 hour(s))  Resp panel by RT-PCR (RSV, Flu A&B, Covid) Anterior Nasal Swab     Status: None   Collection Time: 11/06/22  2:36 PM   Specimen: Anterior Nasal Swab  Result Value Ref Range Status   SARS Coronavirus 2 by RT PCR NEGATIVE NEGATIVE Final    Comment: (NOTE) SARS-CoV-2 target nucleic acids are NOT DETECTED.  The SARS-CoV-2 RNA is generally detectable in upper respiratory specimens during the acute phase of infection. The lowest concentration of SARS-CoV-2 viral copies this assay can detect is 138 copies/mL. A negative result does not preclude  SARS-Cov-2 infection and should not be used as the sole basis for treatment or other patient management decisions. A negative result may occur with  improper specimen collection/handling, submission of specimen other than nasopharyngeal swab, presence of viral mutation(s) within the areas targeted by this assay, and inadequate number of viral copies(<138 copies/mL). A negative result must be combined with clinical observations, patient history, and epidemiological information. The expected result is Negative.  Fact Sheet for Patients:  BloggerCourse.com  Fact Sheet for Healthcare Providers:  SeriousBroker.it  This test is no t yet approved or cleared by the [place] FDA and  has been authorized for detection and/or diagnosis of SARS-CoV-2 by FDA under an Emergency Use Authorization (EUA). This EUA will remain  in effect (meaning this test can be used) for the duration of the COVID-19 declaration under Section 564(b)(1) of the Act, 21 U.S.C.section 360bbb-3(b)(1), unless the authorization is terminated  or revoked sooner.       Influenza A by PCR NEGATIVE NEGATIVE Final   Influenza B by PCR NEGATIVE NEGATIVE Final    Comment:  (NOTE) The Xpert Xpress SARS-CoV-2/FLU/RSV plus assay is intended as an aid in the diagnosis of influenza from Nasopharyngeal swab specimens and should not be used as a sole basis for treatment. Nasal washings and aspirates are unacceptable for Xpert Xpress SARS-CoV-2/FLU/RSV testing.  Fact Sheet for Patients: BloggerCourse.com  Fact Sheet for Healthcare Providers: SeriousBroker.it  This test is not yet approved or cleared by the [place] FDA and has been authorized for detection and/or diagnosis of SARS-CoV-2 by FDA under an Emergency Use Authorization (EUA). This EUA will remain in effect (meaning this test can be used) for the duration of the COVID-19 declaration under Section 564(b)(1) of the Act, 21 U.S.C. section 360bbb-3(b)(1), unless the authorization is terminated or revoked.     Resp Syncytial Virus by PCR NEGATIVE NEGATIVE Final    Comment: (NOTE) Fact Sheet for Patients: BloggerCourse.com  Fact Sheet for Healthcare Providers: SeriousBroker.it  This test is not yet approved or cleared by the [place] FDA and has been authorized for detection and/or diagnosis of SARS-CoV-2 by FDA under an Emergency Use Authorization (EUA). This EUA will remain in effect (meaning this test can be used) for the duration of the COVID-19 declaration under Section 564(b)(1) of the Act, 21 U.S.C. section 360bbb-3(b)(1), unless the authorization is terminated or revoked.  Performed at [facility], [address] [place], [place] [address]   Culture, blood (routine x 2)     Status: None (Preliminary result)   Collection Time: 11/06/22  9:01 PM   Specimen: BLOOD  Result Value Ref Range Status   Specimen Description   Final    BLOOD LEFT ANTECUBITAL Performed at [facility], [address] [place], [place] [address]    Special Requests    Final    BOTTLES DRAWN AEROBIC AND ANAEROBIC Blood Culture adequate volume Performed at [facility], [address], [place], [place] [address]    Culture   Final    NO GROWTH 2 DAYS Performed at [facility], [address] [place], [place] [address]    Report Status PENDING  Incomplete  Culture, blood (routine x 2)     Status: None (Preliminary result)   Collection Time: 11/07/22  8:15 AM   Specimen: BLOOD RIGHT ARM  Result Value Ref Range Status   Specimen Description BLOOD RIGHT ARM  Final   Special Requests AEROBIC BOTTLE ONLY Blood  Culture adequate volume  Final   Culture   Final    NO GROWTH < 24 HOURS Performed at [facility], [address] [place], [place] [address]    Report Status PENDING  Incomplete  Culture, blood (single) w Reflex to ID Panel     Status: None (Preliminary result)   Collection Time: 11/07/22  9:36 AM   Specimen: BLOOD  Result Value Ref Range Status   Specimen Description BLOOD SITE NOT SPECIFIED  Final   Special Requests AEROBIC BOTTLE ONLY Blood Culture adequate volume  Final   Culture   Final    NO GROWTH < 24 HOURS Performed at [facility], [address], [place], [place] [address]    Report Status PENDING  Incomplete      Radiology Studies: VAS [place] LOWER EXTREMITY VENOUS (DVT) (7a-7p)  Result Date: 11/07/2022  Lower Venous DVT Study Patient Name:  STEFANI GRITTER  Date of Exam:   11/07/2022 Medical Rec #: [id number]          Accession #:    [id number] Date of Birth: [birth year]         Patient Gender: F Patient Age:   57 years Exam Location:  [facility] Procedure:      VAS [place] LOWER EXTREMITY VENOUS (DVT) Referring Phys: Evlyn Kanner --------------------------------------------------------------------------------  Indications: Left lower extremity cellulitis. Other Indications: History of DVT affecting the contralateral extremity in 2019. Limitations: Poor ultrasound/tissue interface. Skin  changes. Comparison Study: Multiple prior lower extremity venous examinations. Most                   recent right lower on 07/27/2020 was negative for DVT. Performing Technologist: Jean Rosenthal RDMS, RVT  Examination Guidelines: A complete evaluation includes B-mode imaging, spectral Doppler, color Doppler, and power Doppler as needed of all accessible portions of each vessel. Bilateral testing is considered an integral part of a complete examination. Limited examinations for reoccurring indications may be performed as noted. The reflux portion of the exam is performed with the patient in reverse Trendelenburg.  +-----+---------------+---------+-----------+----------+--------------+ RIGHTCompressibilityPhasicitySpontaneityPropertiesThrombus Aging +-----+---------------+---------+-----------+----------+--------------+ CFV  Full           Yes      Yes                                 +-----+---------------+---------+-----------+----------+--------------+   +---------+---------------+---------+-----------+----------+--------------+ LEFT     CompressibilityPhasicitySpontaneityPropertiesThrombus Aging +---------+---------------+---------+-----------+----------+--------------+ CFV      Full           Yes      Yes                                 +---------+---------------+---------+-----------+----------+--------------+ SFJ      Full                                                        +---------+---------------+---------+-----------+----------+--------------+ FV Prox  Full                                                        +---------+---------------+---------+-----------+----------+--------------+  FV Mid   Full                                                        +---------+---------------+---------+-----------+----------+--------------+ FV DistalFull           Yes      Yes                                  +---------+---------------+---------+-----------+----------+--------------+ PFV      Full                                                        +---------+---------------+---------+-----------+----------+--------------+ POP      Full           Yes      Yes                                 +---------+---------------+---------+-----------+----------+--------------+ PTV      Full                                                        +---------+---------------+---------+-----------+----------+--------------+ PERO     Full                                                        +---------+---------------+---------+-----------+----------+--------------+     Summary: RIGHT: - No evidence of common femoral vein obstruction.  LEFT: - There is no evidence of deep vein thrombosis in the lower extremity.  - No cystic structure found in the popliteal fossa.  - Ultrasound characteristics of enlarged lymph nodes noted in the groin.  *See table(s) above for measurements and observations. Electronically signed by Lemar Livings MD on 11/07/2022 at 5:40:29 PM.    Final    DG Chest Port 1 View  Result Date: 11/06/2022 CLINICAL DATA:  Lymphedema, fatigue, shortness of breath. EXAM: PORTABLE CHEST 1 VIEW COMPARISON:  October 10, 2022 FINDINGS: The cardiac silhouette is borderline in size and unchanged in appearance. Stable, prominent bronchovascular lung markings are seen within the right lung base. There is no evidence of focal consolidation, overt pulmonary edema, pleural effusion or pneumothorax. Mild dextroscoliosis of the mid to lower thoracic spine is present. IMPRESSION: Borderline cardiomegaly without evidence of acute or active cardiopulmonary disease. Electronically Signed   By: Aram Candela M.D.   On: 11/06/2022 23:55   DG Foot Complete Left  Result Date: 11/06/2022 CLINICAL DATA:  Leg pain and swelling EXAM: LEFT FOOT - COMPLETE 3+ VIEW COMPARISON:  02/21/2021 FINDINGS: Limited by habitus  and positioning. Bones appear demineralized. No definitive fracture or dislocation. Pes planus deformity with collapse of the arch. Small plantar calcaneal spur. Copious soft tissue edema IMPRESSION: No definite acute osseous  abnormality allowing for limitations of habitus and positioning Electronically Signed   By: Jasmine Pang M.D.   On: 11/06/2022 22:20   DG Tibia/Fibula Left  Result Date: 11/06/2022 CLINICAL DATA:  Leg pain edema EXAM: LEFT TIBIA AND FIBULA - 2 VIEW COMPARISON:  None Available. FINDINGS: Considerable soft tissue edema. No fracture or malalignment. No osseous destructive change. IMPRESSION: Soft tissue edema.  No acute osseous abnormality Electronically Signed   By: Jasmine Pang M.D.   On: 11/06/2022 22:19       LOS: 1 day   Davetta Olliff  [facility] Pager on [email or web address]  11/08/2022, 9:31 AM

## 2022-11-09 ENCOUNTER — Encounter: Payer: Medicare HMO | Admitting: Occupational Therapy

## 2022-11-09 DIAGNOSIS — N179 Acute kidney failure, unspecified: Secondary | ICD-10-CM

## 2022-11-09 DIAGNOSIS — A419 Sepsis, unspecified organism: Secondary | ICD-10-CM | POA: Diagnosis not present

## 2022-11-09 DIAGNOSIS — L0390 Cellulitis, unspecified: Secondary | ICD-10-CM | POA: Diagnosis not present

## 2022-11-09 DIAGNOSIS — I10 Essential (primary) hypertension: Secondary | ICD-10-CM | POA: Diagnosis not present

## 2022-11-09 LAB — BASIC METABOLIC PANEL
Anion gap: 8 (ref 5–15)
BUN: 10 mg/dL (ref 6–20)
CO2: 22 mmol/L (ref 22–32)
Calcium: 8.4 mg/dL — ABNORMAL LOW (ref 8.9–10.3)
Chloride: 104 mmol/L (ref 98–111)
Creatinine, Ser: 0.87 mg/dL (ref 0.44–1.00)
GFR, Estimated: >60 mL/min (ref >60)
Glucose, Bld: 114 mg/dL — ABNORMAL HIGH (ref 70–99)
Potassium: 4.1 mmol/L (ref 3.5–5.1)
Sodium: 134 mmol/L — ABNORMAL LOW (ref 135–145)

## 2022-11-09 LAB — CBC
HCT: 30.9 % — ABNORMAL LOW (ref 36.0–46.0)
Hemoglobin: 9.6 g/dL — ABNORMAL LOW (ref 12.0–15.0)
MCH: 29.2 pg (ref 26.0–34.0)
MCHC: 31.1 g/dL (ref 30.0–36.0)
MCV: 93.9 fL (ref 80.0–100.0)
Platelets: 195 10*3/uL (ref 150–400)
RBC: 3.29 MIL/uL — ABNORMAL LOW (ref 3.87–5.11)
RDW: 16.6 % — ABNORMAL HIGH (ref 11.5–15.5)
WBC: 8.1 10*3/uL (ref 4.0–10.5)
nRBC: 0 % (ref 0.0–0.2)

## 2022-11-09 NOTE — Plan of Care (Signed)

## 2022-11-09 NOTE — Progress Notes (Signed)
Mobility Specialist: Progress Note   11/09/22 1510  Mobility  Activity Ambulated with assistance in hallway;Ambulated with assistance to bathroom  Level of Assistance Modified independent, requires aide device or extra time  Assistive Device Four wheel walker  Distance Ambulated (ft) 60 ft  Activity Response Tolerated well  Mobility Referral Yes  $Mobility charge 1 Mobility  Mobility Specialist Start Time (ACUTE ONLY) 1419  Mobility Specialist Stop Time (ACUTE ONLY) 1439  Mobility Specialist Time Calculation (min) (ACUTE ONLY) 20 min    Pt was agreeable to mobility session - received in bed. Had c/o LLE pain that decreased during ambulation. Ambulation limited d/t pt feeling a little lightheaded and fatigued. Returned to room without fault. Left in chair with all needs met, call bell in reach.   Maurene Capes Mobility Specialist Please contact via SecureChat or Rehab office at [phone]

## 2022-11-09 NOTE — Progress Notes (Signed)
Physical Therapy Treatment Patient Details Name: [name] MRN: [id number] DOB: 1965/03/25 Today's Date: 11/09/2022   History of Present Illness 57 y.o. female who presents to the ED from PT appointment due to 1 day duration of left lower extremity pain erythema warmth and edema. PMH: severe morbid obesity, lymphedema, antiphospholipid syndrome, history of pulmonary embolism on Eliquis, rheumatoid arthritis,    PT Comments  Focused session on gait training and performing lower extremity AROM exercises to improve joint pain and reduce edema. Pt ambulates with a slightly antalgic gait pattern, often dragging her L foot, reportedly due to chronic L knee issues. Will continue to follow acutely.   If plan is discharge home, recommend the following: Assistance with cooking/housework;Assist for transportation   Can travel by private vehicle        Equipment Recommendations  BSC/3in1 (bariatric)    Recommendations for Other Services       Precautions / Restrictions Precautions Precautions: None Restrictions Weight Bearing Restrictions: No     Mobility  Bed Mobility               General bed mobility comments: Pt up in recliner start and end of session    Transfers Overall transfer level: Modified independent Equipment used: Rollator (4 wheels)               General transfer comment: increased effort but able to come to stand from chair and from toilet to rollator without assistance or LOB    Ambulation/Gait Ambulation/Gait assistance: Supervision Gait Distance (Feet): 130 Feet (x2 bouts of ~130 ft > ~25 ft) Assistive device: Rollator (4 wheels) Gait Pattern/deviations: Step-through pattern, Shuffle, Trunk flexed, Decreased dorsiflexion - left, Antalgic Gait velocity: slowed Gait velocity interpretation: <1.8 ft/sec, indicate of risk for recurrent falls   General Gait Details: Pt ambulates slowly with a flexed posture and an antalgic gait pattern with  decreased L foot clearance due to chronic L knee issues per pt. No overt LOB, cues to improve posture and feet clearance, supervision for safety   Stairs             Wheelchair Mobility     Tilt Bed    Modified Rankin (Stroke Patients Only)       Balance Overall balance assessment: Mild deficits observed, not formally tested                                          Cognition Arousal: Alert Behavior During Therapy: WFL for tasks assessed/performed Overall Cognitive Status: Within Functional Limits for tasks assessed                                          Exercises General Exercises - Lower Extremity Ankle Circles/Pumps: AROM, Both, 20 reps, Seated, Other (comment) (with legs elevated) Long Arc Quad: AROM, Both, 10 reps, Seated Hip ABduction/ADduction: AROM, Both, 10 reps, Seated Hip Flexion/Marching: AROM, Both, 10 reps, Seated    General Comments General comments (skin integrity, edema, etc.): encouraged leg exercises and elevating legs      Pertinent Vitals/Pain Pain Assessment Pain Assessment: Faces Faces Pain Scale: Hurts little more Pain Location: L LE Pain Descriptors / Indicators: Burning, Discomfort, Grimacing, Guarding Pain Intervention(s): Monitored during session, Limited activity within patient's tolerance, Repositioned, Patient requesting pain meds-RN notified  Home Living                          Prior Function            PT Goals (current goals can now be found in the care plan section) Acute Rehab PT Goals Patient Stated Goal: get back to the gym PT Goal Formulation: With patient Time For Goal Achievement: 11/21/22 Potential to Achieve Goals: Good Progress towards PT goals: Progressing toward goals    Frequency    Min 1X/week      PT Plan      Co-evaluation              AM-PAC PT "6 Clicks" Mobility   Outcome Measure  Help needed turning from your back to your side  while in a flat bed without using bedrails?: None Help needed moving from lying on your back to sitting on the side of a flat bed without using bedrails?: None Help needed moving to and from a bed to a chair (including a wheelchair)?: None Help needed standing up from a chair using your arms (e.g., wheelchair or bedside chair)?: None Help needed to walk in hospital room?: A Little Help needed climbing 3-5 steps with a railing? : A Little 6 Click Score: 22    End of Session   Activity Tolerance: Patient limited by pain Patient left: with call bell/phone within reach;in chair (no chair alarm on upon arrival) Nurse Communication: Patient requests pain meds PT Visit Diagnosis: Muscle weakness (generalized) (M62.81);Pain Pain - Right/Left: Left Pain - part of body: Leg     Time: 4098-1191 PT Time Calculation (min) (ACUTE ONLY): 27 min  Charges:    $Gait Training: 8-22 mins $Therapeutic Exercise: 8-22 mins PT General Charges $$ ACUTE PT VISIT: 1 Visit                     Virgil Benedict, PT, DPT Acute Rehabilitation Services  Office: [phone]    Bettina Gavia 11/09/2022, 6:19 PM

## 2022-11-09 NOTE — Progress Notes (Signed)
[facility] PROGRESS NOTE   [name] XBM:[id number] DOB: 10/22/1965 DOA: 11/06/2022  PCP: Claiborne Rigg, NP  Brief History/Interval Summary: [phone] with h/o morbid obesity with lymphedema, antiphospholipid syndrome, PE on Eliquis, and RA who presented on 9/9 with LLE pain and erythema, diagnosed with sepsis due to cellulitis. She was started on Rocephin and Vancomycin.   Consultants: None  Procedures: None    Subjective/Interval History: Patient complains of pain in the left lower extremity today.  No other complaints offered.     Assessment/Plan:  Left lower extremity cellulitis/sepsis, present on admission  SIRS criteria in this patient includes: Leukocytosis, tachycardia  Patient has evidence of acute organ failure with recurrent hypotension (SBP < 90 or MAP < 65 x 2 readings) that is not easily explained by another condition. Patient with h/o severe lymphedema of BLE Patient noted to have erythema to the left lower extremity.  Presented with elevated WBC. Patient was given vancomycin and ceftriaxone in the emergency department.  Continued on the same.   WBC continues to improve.  Erythema and warmth continues to get better.  Complains of pain this morning. Imaging studies done at the time of admission did not raise any concern for osteomyelitis or gas in the soft tissue. Doppler studies negative for DVT.  Blood cultures were negative. Continue current antibiotics for another 24 hours.  AKI, prerenal secondary to dehydration Baseline creatinine 0.9 with GFR of greater than 60. Noted to have worsening renal failure with rising creatinine to 1.66.   Improved with IV hydration.    Lymphedema Significant baseline lymphedema with ambulatory dysfunction   Murmur/small pericardial effusion Echocardiogram was done which does not show any significant valvular abnormality.  Small pericardial effusion was noted.  This was seen previously as well.  Will check  TSH.  Essential hypertension Antihypertensives currently on hold due to borderline pressures.  Normocytic anemia Drop in hemoglobin likely dilutional.  No evidence of overt bleeding.  Hemoglobin stable this morning.   History of pulmonary embolism on Eliquis Continue Eliquis.     Rheumatoid arthritis Hold methotrexate, Cimzia   GERD Resume famotidine   Physical debility PT OT assessment Fall precautions.   Isolated elevated T. Bili T. bili 3.5 on presentation.  Possibly due to sepsis.  Improvement noted.  Morbid obesity Estimated body mass index is 49.62 kg/m as calculated from the following:   Height as of this encounter: 5\' 9"  (1.753 m).   Weight as of this encounter: 152.4 kg.  DVT Prophylaxis: On Eliquis Code Status: Full code Family Communication: Discussed with patient Disposition Plan: Anticipate discharge in 24 to 48 hours.     Medications: Scheduled:  apixaban  5 mg Oral BID   ezetimibe  10 mg Oral Daily   famotidine  40 mg Oral Daily   folic acid  1 mg Oral Daily   [START ON 11/11/2022] Vitamin D (Ergocalciferol)  50,000 Units Oral Q Sat   Continuous:  cefTRIAXone (ROCEPHIN)  IV 2 g (11/08/22 1042)   vancomycin 1,250 mg (11/09/22 0853)   ZDG:UYQIHKVQQVZDG, albuterol, melatonin, midodrine, oxyCODONE, polyethylene glycol, prochlorperazine  Antibiotics: Anti-infectives (From admission, onward)    Start     Dose/Rate Route Frequency Ordered Stop   11/07/22 2200  vancomycin (VANCOREADY) IVPB 1250 mg/250 mL        1,250 mg 166.7 mL/hr over 90 Minutes Intravenous Every 12 hours 11/07/22 0552     11/07/22 0645  vancomycin (VANCOREADY) IVPB 2000 mg/400 mL  2,000 mg 200 mL/hr over 120 Minutes Intravenous  Once 11/07/22 0552 11/07/22 1036   11/07/22 0600  cefTRIAXone (ROCEPHIN) 2 g in sodium chloride 0.9 % 100 mL IVPB        2 g 200 mL/hr over 30 Minutes Intravenous Daily 11/07/22 0542     11/06/22 2330  clindamycin (CLEOCIN) IVPB 600 mg        600  mg 100 mL/hr over 30 Minutes Intravenous  Once 11/06/22 2322 11/07/22 0128       Objective:  Vital Signs  Vitals:   11/08/22 1641 11/08/22 2031 11/09/22 0528 11/09/22 0740  BP: 135/77 (!) 152/82 (!) 129/55 (!) 122/56  Pulse: 83 91 85 86  Resp: 18 16 20 18   Temp: 98.9 F (37.2 C) 99 F (37.2 C) 99 F (37.2 C) 98.6 F (37 C)  TempSrc: Oral Oral Oral Oral  SpO2: 100% 100% 99% 100%  Weight:      Height:       No intake or output data in the 24 hours ending 11/09/22 0953  Filed Weights   11/06/22 1404  Weight: (!) 152.4 kg    General appearance: Awake alert.  In no distress Resp: Clear to auscultation bilaterally.  Normal effort Cardio: S1-S2 is normal regular.  No S3-S4.  No rubs murmurs or bruit GI: Abdomen is soft.  Nontender nondistended.  Bowel sounds are present normal.  No masses organomegaly Extremities: Lymphedematous changes noted in both lower extremities.  Left leg is slightly larger compared to the right.  Erythema is present.  Warm to touch.  Slight improvement compared to yesterday.   Lab Results:  Data Reviewed: I have personally reviewed following labs and reports of the imaging studies  CBC: Recent Labs  Lab 11/06/22 1436 11/07/22 0816 11/08/22 0350 11/09/22 0627  WBC 16.7* 16.2* 10.2 8.1  NEUTROABS 13.5*  --  6.7  --   HGB 13.0 9.7* 9.5* 9.6*  HCT 41.5 30.6* 30.0* 30.9*  MCV 93.5 92.2 94.3 93.9  PLT 225 167 175 195    Basic Metabolic Panel: Recent Labs  Lab 11/06/22 1436 11/07/22 0816 11/08/22 0350 11/09/22 0627  NA 134* 134* 134* 134*  K 4.1 3.5 4.1 4.1  CL 100 103 106 104  CO2 24 24 21* 22  GLUCOSE 118* 133* 120* 114*  BUN 21* 26* 21* 10  CREATININE 1.29* 1.66* 1.11* 0.87  CALCIUM 9.3 8.2* 8.0* 8.4*  MG  --  2.2  --   --   PHOS  --  2.6  --   --     GFR: Estimated Creatinine Clearance: 117.5 mL/min (by C-G formula based on SCr of 0.87 mg/dL).  Liver Function Tests: Recent Labs  Lab 11/06/22 1436 11/07/22 0816  11/08/22 0350  AST 13* 11* 17  ALT 21 17 21   ALKPHOS 52 36* 37*  BILITOT 3.5* 2.3* 1.5*  PROT 8.0 6.0* 5.8*  ALBUMIN 3.9 2.6* 2.6*     Recent Results (from the past 240 hour(s))  Resp panel by RT-PCR (RSV, Flu A&B, Covid) Anterior Nasal Swab     Status: None   Collection Time: 11/06/22  2:36 PM   Specimen: Anterior Nasal Swab  Result Value Ref Range Status   SARS Coronavirus 2 by RT PCR NEGATIVE NEGATIVE Final    Comment: (NOTE) SARS-CoV-2 target nucleic acids are NOT DETECTED.  The SARS-CoV-2 RNA is generally detectable in upper respiratory specimens during the acute phase of infection. The lowest concentration of SARS-CoV-2 viral copies this assay  can detect is 138 copies/mL. A negative result does not preclude SARS-Cov-2 infection and should not be used as the sole basis for treatment or other patient management decisions. A negative result may occur with  improper specimen collection/handling, submission of specimen other than nasopharyngeal swab, presence of viral mutation(s) within the areas targeted by this assay, and inadequate number of viral copies(<138 copies/mL). A negative result must be combined with clinical observations, patient history, and epidemiological information. The expected result is Negative.  Fact Sheet for Patients:  BloggerCourse.com  Fact Sheet for Healthcare Providers:  SeriousBroker.it  This test is no t yet approved or cleared by the [place] FDA and  has been authorized for detection and/or diagnosis of SARS-CoV-2 by FDA under an Emergency Use Authorization (EUA). This EUA will remain  in effect (meaning this test can be used) for the duration of the COVID-19 declaration under Section 564(b)(1) of the Act, 21 U.S.C.section 360bbb-3(b)(1), unless the authorization is terminated  or revoked sooner.       Influenza A by PCR NEGATIVE NEGATIVE Final   Influenza B by PCR NEGATIVE  NEGATIVE Final    Comment: (NOTE) The Xpert Xpress SARS-CoV-2/FLU/RSV plus assay is intended as an aid in the diagnosis of influenza from Nasopharyngeal swab specimens and should not be used as a sole basis for treatment. Nasal washings and aspirates are unacceptable for Xpert Xpress SARS-CoV-2/FLU/RSV testing.  Fact Sheet for Patients: BloggerCourse.com  Fact Sheet for Healthcare Providers: SeriousBroker.it  This test is not yet approved or cleared by the [place] FDA and has been authorized for detection and/or diagnosis of SARS-CoV-2 by FDA under an Emergency Use Authorization (EUA). This EUA will remain in effect (meaning this test can be used) for the duration of the COVID-19 declaration under Section 564(b)(1) of the Act, 21 U.S.C. section 360bbb-3(b)(1), unless the authorization is terminated or revoked.     Resp Syncytial Virus by PCR NEGATIVE NEGATIVE Final    Comment: (NOTE) Fact Sheet for Patients: BloggerCourse.com  Fact Sheet for Healthcare Providers: SeriousBroker.it  This test is not yet approved or cleared by the [place] FDA and has been authorized for detection and/or diagnosis of SARS-CoV-2 by FDA under an Emergency Use Authorization (EUA). This EUA will remain in effect (meaning this test can be used) for the duration of the COVID-19 declaration under Section 564(b)(1) of the Act, 21 U.S.C. section 360bbb-3(b)(1), unless the authorization is terminated or revoked.  Performed at [facility], [address], [place], [place] [address]   Culture, blood (routine x 2)     Status: None (Preliminary result)   Collection Time: 11/06/22  9:01 PM   Specimen: BLOOD  Result Value Ref Range Status   Specimen Description   Final    BLOOD LEFT ANTECUBITAL Performed at [facility], [address] [place], [place]  [address]    Special Requests   Final    BOTTLES DRAWN AEROBIC AND ANAEROBIC Blood Culture adequate volume Performed at [facility], [address] [place], [place] [address]    Culture   Final    NO GROWTH 3 DAYS Performed at [facility], [address] [place], [place] [address]    Report Status PENDING  Incomplete  Culture, blood (routine x 2)     Status: None (Preliminary result)   Collection Time: 11/07/22  8:15 AM   Specimen: BLOOD RIGHT ARM  Result Value Ref Range Status   Specimen Description BLOOD RIGHT  ARM  Final   Special Requests AEROBIC BOTTLE ONLY Blood Culture adequate volume  Final   Culture   Final    NO GROWTH 2 DAYS Performed at [facility], [address] [place], [place] [address]    Report Status PENDING  Incomplete  Culture, blood (single) w Reflex to ID Panel     Status: None (Preliminary result)   Collection Time: 11/07/22  9:36 AM   Specimen: BLOOD  Result Value Ref Range Status   Specimen Description BLOOD SITE NOT SPECIFIED  Final   Special Requests AEROBIC BOTTLE ONLY Blood Culture adequate volume  Final   Culture   Final    NO GROWTH 2 DAYS Performed at [facility], [address] [place], [place] [address]    Report Status PENDING  Incomplete      Radiology Studies: ECHOCARDIOGRAM COMPLETE  Result Date: 11/08/2022    ECHOCARDIOGRAM REPORT   Patient Name:   [name] Date of Exam: 11/08/2022 Medical Rec #:  [id number]         Height:       69.0 in Accession #:    [id number]        Weight:       336.0 lb Date of Birth:  10/18/1965        BSA:          2.576 m Patient Age:    56 years          BP:           111/66 mmHg Patient Gender: F                 HR:           69 bpm. Exam Location:  Inpatient Procedure: 2D Echo, Cardiac Doppler and Color Doppler Indications:    R01.1 Murmur  History:        Patient has prior history of Echocardiogram examinations, most                 recent 04/08/2021. Abnormal  ECG, Signs/Symptoms:Edema, Murmur,                 Shortness of Breath, Dyspnea and Chest Pain; Risk                 Factors:Hypertension.  Sonographer:    Sheralyn Boatman RDCS Referring Phys: [id number] JENNIFER YATES  Sonographer Comments: Patient is obese. IMPRESSIONS  1. Left ventricular ejection fraction, by estimation, is 60 to 65%. The left ventricle has normal function. The left ventricle has no regional wall motion abnormalities. There is mild left ventricular hypertrophy. Left ventricular diastolic parameters were normal. The average left ventricular global longitudinal strain is -20.0 %. The global longitudinal strain is normal.  2. Right ventricular systolic function is normal. The right ventricular size is normal. There is normal pulmonary artery systolic pressure.  3. A small pericardial effusion is present.  4. The mitral valve is normal in structure. No evidence of mitral valve regurgitation.  5. Very mild aortic valve mean gradient of 9 mmHg. The aortic valve is grossly normal. Aortic valve regurgitation is not visualized.  6. The inferior vena cava is normal in size with greater than 50% respiratory variability, suggesting right atrial pressure of 3 mmHg. Conclusion(s)/Recommendation(s): Normal biventricular function without evidence of hemodynamically significant valvular heart disease. FINDINGS  Left Ventricle: No LVOT obstruction. Left ventricular ejection fraction, by estimation, is 60 to 65%. The left ventricle has normal  function. The left ventricle has no regional wall motion abnormalities. The average left ventricular global longitudinal strain is -20.0 %. The global longitudinal strain is normal. The left ventricular internal cavity size was normal in size. There is mild left ventricular hypertrophy. Left ventricular diastolic parameters were normal. Right Ventricle: The right ventricular size is normal. Right ventricular systolic function is normal. There is normal pulmonary artery systolic pressure.  The tricuspid regurgitant velocity is 2.57 m/s, and with an assumed right atrial pressure of 8 mmHg,  the estimated right ventricular systolic pressure is 34.4 mmHg. Left Atrium: Left atrial size was normal in size. Right Atrium: Right atrial size was normal in size. Pericardium: A small pericardial effusion is present. Mitral Valve: The mitral valve is normal in structure. No evidence of mitral valve regurgitation. Tricuspid Valve: Tricuspid valve regurgitation is mild. Aortic Valve: Very mild aortic valve mean gradient of 9 mmHg. The aortic valve is grossly normal. Aortic valve regurgitation is not visualized. Aortic valve mean gradient measures 9.0 mmHg. Aortic valve peak gradient measures 16.5 mmHg. Pulmonic Valve: Pulmonic valve regurgitation is not visualized. Aorta: The aortic root and ascending aorta are structurally normal, with no evidence of dilitation. Venous: The inferior vena cava is normal in size with greater than 50% respiratory variability, suggesting right atrial pressure of 3 mmHg. IAS/Shunts: No atrial level shunt detected by color flow Doppler.  LEFT VENTRICLE PLAX 2D LVIDd:         4.40 cm      Diastology LVIDs:         2.40 cm      LV e' medial:    7.29 cm/s LV PW:         1.10 cm      LV E/e' medial:  13.4 LV IVS:        1.20 cm      LV e' lateral:   8.59 cm/s LVOT diam:     2.20 cm      LV E/e' lateral: 11.4 LVOT Area:     3.80 cm                             2D Longitudinal Strain                             2D Strain GLS Avg:     -20.0 % LV Volumes (MOD) LV vol d, MOD A2C: 106.0 ml LV vol d, MOD A4C: 59.1 ml LV vol s, MOD A2C: 25.9 ml LV vol s, MOD A4C: 19.1 ml LV SV MOD A2C:     80.1 ml LV SV MOD A4C:     59.1 ml LV SV MOD BP:      59.7 ml RIGHT VENTRICLE             IVC RV S prime:     13.10 cm/s  IVC diam: 1.90 cm TAPSE (M-mode): 2.4 cm LEFT ATRIUM             Index        RIGHT ATRIUM           Index LA diam:        3.50 cm 1.36 cm/m   RA Area:     16.10 cm LA Vol (A2C):   34.6 ml  13.43 ml/m  RA Volume:   39.70 ml  15.41 ml/m LA Vol (A4C):   55.7  ml 21.63 ml/m LA Biplane Vol: 45.6 ml 17.70 ml/m  AORTIC VALVE               PULMONIC VALVE AV Vmax:      203.00 cm/s  PR End Diast Vel: 1.37 msec AV Vmean:     136.000 cm/s AV VTI:       0.382 m AV Peak Grad: 16.5 mmHg AV Mean Grad: 9.0 mmHg  AORTA Ao Root diam: 3.20 cm Ao Asc diam:  3.10 cm MITRAL VALVE               TRICUSPID VALVE MV Area (PHT): 3.73 cm    TR Peak grad:   26.4 mmHg MV Decel Time: 203 msec    TR Vmax:        257.00 cm/s MV E velocity: 97.80 cm/s MV A velocity: 81.50 cm/s  SHUNTS MV E/A ratio:  1.20        Systemic Diam: 2.20 cm Carolan Clines Electronically signed by Carolan Clines Signature Date/Time: 11/08/2022/2:51:19 PM    Final    VAS [place] LOWER EXTREMITY VENOUS (DVT) (7a-7p)  Result Date: 11/07/2022  Lower Venous DVT Study Patient Name:  [name]  Date of Exam:   11/07/2022 Medical Rec #: [id number]          Accession #:    [id number] Date of Birth: [birth year]         Patient Gender: F Patient Age:   49 years Exam Location:  [facility] Procedure:      VAS [place] LOWER EXTREMITY VENOUS (DVT) Referring Phys: Evlyn Kanner --------------------------------------------------------------------------------  Indications: Left lower extremity cellulitis. Other Indications: History of DVT affecting the contralateral extremity in 2019. Limitations: Poor ultrasound/tissue interface. Skin changes. Comparison Study: Multiple prior lower extremity venous examinations. Most                   recent right lower on 07/27/2020 was negative for DVT. Performing Technologist: Jean Rosenthal RDMS, RVT  Examination Guidelines: A complete evaluation includes B-mode imaging, spectral Doppler, color Doppler, and power Doppler as needed of all accessible portions of each vessel. Bilateral testing is considered an integral part of a complete examination. Limited examinations for reoccurring indications may be performed as noted. The reflux  portion of the exam is performed with the patient in reverse Trendelenburg.  +-----+---------------+---------+-----------+----------+--------------+ RIGHTCompressibilityPhasicitySpontaneityPropertiesThrombus Aging +-----+---------------+---------+-----------+----------+--------------+ CFV  Full           Yes      Yes                                 +-----+---------------+---------+-----------+----------+--------------+   +---------+---------------+---------+-----------+----------+--------------+ LEFT     CompressibilityPhasicitySpontaneityPropertiesThrombus Aging +---------+---------------+---------+-----------+----------+--------------+ CFV      Full           Yes      Yes                                 +---------+---------------+---------+-----------+----------+--------------+ SFJ      Full                                                        +---------+---------------+---------+-----------+----------+--------------+ FV Prox  Full                                                        +---------+---------------+---------+-----------+----------+--------------+  FV Mid   Full                                                        +---------+---------------+---------+-----------+----------+--------------+ FV DistalFull           Yes      Yes                                 +---------+---------------+---------+-----------+----------+--------------+ PFV      Full                                                        +---------+---------------+---------+-----------+----------+--------------+ POP      Full           Yes      Yes                                 +---------+---------------+---------+-----------+----------+--------------+ PTV      Full                                                        +---------+---------------+---------+-----------+----------+--------------+ PERO     Full                                                         +---------+---------------+---------+-----------+----------+--------------+     Summary: RIGHT: - No evidence of common femoral vein obstruction.  LEFT: - There is no evidence of deep vein thrombosis in the lower extremity.  - No cystic structure found in the popliteal fossa.  - Ultrasound characteristics of enlarged lymph nodes noted in the groin.  *See table(s) above for measurements and observations. Electronically signed by Lemar Livings MD on 11/07/2022 at 5:40:29 PM.    Final        LOS: 2 days   Osvaldo Shipper  [facility] Pager on [email or web address]  11/09/2022, 9:53 AM

## 2022-11-09 NOTE — Plan of Care (Signed)
  Problem: Clinical Measurements: Goal: Cardiovascular complication will be avoided Outcome: Progressing   Problem: Activity: Goal: Risk for activity intolerance will decrease Outcome: Progressing   Problem: Nutrition: Goal: Adequate nutrition will be maintained Outcome: Progressing   

## 2022-11-09 NOTE — Progress Notes (Signed)
PT Cancellation Note  Patient Details Name: [name] MRN: [id number] DOB: January 13, 1966   Cancelled Treatment:    Reason Eval/Treat Not Completed: (P) Pain limiting ability to participate Pt is waiting on RN for pain medication. PT will follow back after pain meds are administered.   Dejai Schubach B. Beverely Risen PT, DPT Acute Rehabilitation Services Please use secure chat or  Call Office [phone]    [facility] 11/09/2022, 9:21 AM

## 2022-11-10 ENCOUNTER — Other Ambulatory Visit (HOSPITAL_COMMUNITY): Payer: Medicare HMO

## 2022-11-10 ENCOUNTER — Inpatient Hospital Stay (HOSPITAL_COMMUNITY): Payer: Medicare HMO

## 2022-11-10 DIAGNOSIS — I890 Lymphedema, not elsewhere classified: Secondary | ICD-10-CM

## 2022-11-10 DIAGNOSIS — I10 Essential (primary) hypertension: Secondary | ICD-10-CM | POA: Diagnosis not present

## 2022-11-10 DIAGNOSIS — L0390 Cellulitis, unspecified: Secondary | ICD-10-CM | POA: Diagnosis not present

## 2022-11-10 DIAGNOSIS — A419 Sepsis, unspecified organism: Secondary | ICD-10-CM | POA: Diagnosis not present

## 2022-11-10 LAB — CBC
HCT: 30.1 % — ABNORMAL LOW (ref 36.0–46.0)
Hemoglobin: 9.4 g/dL — ABNORMAL LOW (ref 12.0–15.0)
MCH: 29.4 pg (ref 26.0–34.0)
MCHC: 31.2 g/dL (ref 30.0–36.0)
MCV: 94.1 fL (ref 80.0–100.0)
Platelets: 204 10*3/uL (ref 150–400)
RBC: 3.2 MIL/uL — ABNORMAL LOW (ref 3.87–5.11)
RDW: 16.5 % — ABNORMAL HIGH (ref 11.5–15.5)
WBC: 11.5 10*3/uL — ABNORMAL HIGH (ref 4.0–10.5)
nRBC: 0 % (ref 0.0–0.2)

## 2022-11-10 LAB — COMPREHENSIVE METABOLIC PANEL
ALT: 20 U/L (ref 0–44)
AST: 15 U/L (ref 15–41)
Albumin: 2.5 g/dL — ABNORMAL LOW (ref 3.5–5.0)
Alkaline Phosphatase: 36 U/L — ABNORMAL LOW (ref 38–126)
Anion gap: 9 (ref 5–15)
BUN: 12 mg/dL (ref 6–20)
CO2: 24 mmol/L (ref 22–32)
Calcium: 8.5 mg/dL — ABNORMAL LOW (ref 8.9–10.3)
Chloride: 103 mmol/L (ref 98–111)
Creatinine, Ser: 0.94 mg/dL (ref 0.44–1.00)
GFR, Estimated: >60 mL/min (ref >60)
Glucose, Bld: 117 mg/dL — ABNORMAL HIGH (ref 70–99)
Potassium: 4.1 mmol/L (ref 3.5–5.1)
Sodium: 136 mmol/L (ref 135–145)
Total Bilirubin: 1.6 mg/dL — ABNORMAL HIGH (ref 0.3–1.2)
Total Protein: 6 g/dL — ABNORMAL LOW (ref 6.5–8.1)

## 2022-11-10 LAB — T4, FREE: Free T4: 1.04 ng/dL (ref 0.61–1.12)

## 2022-11-10 LAB — TSH: TSH: 3.667 u[IU]/mL (ref 0.350–4.500)

## 2022-11-10 MED ORDER — CEFAZOLIN SODIUM-DEXTROSE 2-4 GM/100ML-% IV SOLN
2.0000 g | Freq: Three times a day (TID) | INTRAVENOUS | Status: DC
  Administered 2022-11-10 – 2022-11-13 (×9): 2 g via INTRAVENOUS
  Filled 2022-11-10 (×9): qty 100

## 2022-11-10 MED ORDER — GADOBUTROL 1 MMOL/ML IV SOLN
10.0000 mL | Freq: Once | INTRAVENOUS | Status: AC | PRN
  Administered 2022-11-10: 10 mL via INTRAVENOUS

## 2022-11-10 NOTE — Progress Notes (Signed)
Pharmacy Antibiotic Note  [name] is a 57 y.o. female admitted on 11/06/2022 with cellulitis of LLE. Also has hx of lymphedema. Pharmacy has been consulted for Vancomycin dosing.  MRI 9/13 negative for OM or abscess. TM 100. WBC 11.   Plan: Continue Vancomycin 1250 mg IV q12h Ceftriaxone 2g IV q24 hours per MD Monitor clinical improvement, ability to narrow antibiotics Would plan for vancomycin levels early next week if therapy will continue   Height: 5\' 9"  (175.3 cm) Weight: (!) 152.4 kg (336 lb) IBW/kg (Calculated) : 66.2  Temp (24hrs), Avg:99.8 F (37.7 C), Min:98.6 F (37 C), Max:100.9 F (38.3 C)  Recent Labs  Lab 11/06/22 1436 11/06/22 2118 11/06/22 2305 11/07/22 0816 11/08/22 0350 11/09/22 0627 11/10/22 0321  WBC 16.7*  --   --  16.2* 10.2 8.1 11.5*  CREATININE 1.29*  --   --  1.66* 1.11* 0.87 0.94  LATICACIDVEN  --  2.0* 1.3  --   --   --   --     Estimated Creatinine Clearance: 108.8 mL/min (by C-G formula based on SCr of 0.94 mg/dL).    Allergies  Allergen Reactions   Abatacept Anaphylaxis    Other reaction(s): Anaphylaxis-Streptomycin Clickject Other reaction(s): Anaphylaxis-Streptomycin   Bee Venom Anaphylaxis   Augmentin [Amoxicillin-Pot Clavulanate] Hives, Itching and Other (See Comments)    Did PCN reaction causing immediate rash, facial/tongue/throat swelling, SOB or lightheadedness with hypotension: yes Did PCN reaction causing severe rash involving mucus membranes or skin necrosis: no Has patient had a PCN reaction that required hospitalization: in hospital Has patient had a PCN reaction occurring within the last 10 years: no If all of the above answers are "NO", then may proceed with Cephalosporin use.  Pt reports no recollection of any reactions when taking penicillin in past   Latex Hives and Other (See Comments)    Local reaction (welts). Patient denies any wheezing or other reaction with latex exposure   Nulytely [Peg 3350-Kcl-Na  Bicarb-Nacl]     NAUSEA AND VOMITING. MAY TOLERATE LOW VOLUME PREP.   Antimicrobials this admission:  Vancomycin 9/10 >>  Ceftriaxone 9/10 >>   Dose adjustments this admission:   Microbiology results:  9/10 BCx: ngtd 9/9 Bcx: ngtd   Rexford Maus, PharmD, BCPS 11/10/2022 12:21 PM'

## 2022-11-10 NOTE — Care Management Important Message (Signed)
Important Message  Patient Details  Name: [name] MRN: [id number] Date of Birth: [birth year]   Medicare Important Message Given:  Yes     Sherilyn Banker 11/10/2022, 1:20 PM

## 2022-11-10 NOTE — Progress Notes (Signed)
[facility] PROGRESS NOTE   [name] ZOX:[id number] DOB: 1965/08/05 DOA: 11/06/2022  PCP: Claiborne Rigg, NP  Brief History/Interval Summary: [phone] with h/o morbid obesity with lymphedema, antiphospholipid syndrome, PE on Eliquis, and RA who presented on 9/9 with LLE pain and erythema, diagnosed with sepsis due to cellulitis. She was started on Rocephin and Vancomycin.   Consultants: None  Procedures: None    Subjective/Interval History: Complains of pain in the left lower extremity.  Had low-grade fever overnight.    Assessment/Plan:  Left lower extremity cellulitis/sepsis, present on admission  SIRS criteria in this patient includes: Leukocytosis, tachycardia  Patient has evidence of acute organ failure with recurrent hypotension (SBP < 90 or MAP < 65 x 2 readings) that is not easily explained by another condition. Patient with h/o severe lymphedema of BLE Patient noted to have erythema to the left lower extremity.  Presented with elevated WBC. Patient was given vancomycin and ceftriaxone in the emergency department.  Continued on the same.   Patient was clinically improving however yesterday she started developing pain in the left leg and was noted to have low-grade fevers overnight.  WBC is noted to be higher today compared to yesterday. Her left leg is always chronically larger compared to the right.  Doppler studies are negative for DVT. Blood cultures have been negative. Will proceed with MRI of the left lower extremity to rule out abscess. May need to involve infectious disease to assist with management.  Continue current antibiotics for now.  AKI, prerenal secondary to dehydration Baseline creatinine 0.9 with GFR of greater than 60. Noted to have worsening renal failure with rising creatinine to 1.66.   Improved with IV hydration.    Lymphedema Significant baseline lymphedema with ambulatory dysfunction.  Her left leg is chronically larger than  right.   Murmur/small pericardial effusion Echocardiogram was done which does not show any significant valvular abnormality.  Small pericardial effusion was noted.  This was seen previously as well.  Thyroid function tests were normal.  Essential hypertension Antihypertensives currently on hold due to borderline pressures.  Occasional high readings noted.  Normocytic anemia Drop in hemoglobin likely dilutional.  No evidence of overt bleeding.  Hemoglobin has been stable.   History of pulmonary embolism on Eliquis Continue Eliquis.     Rheumatoid arthritis Hold methotrexate, Cimzia   GERD Resume famotidine   Physical debility PT OT assessment Fall precautions.   Isolated elevated T. Bili T. bili 3.5 on presentation.  Possibly due to sepsis.  Improvement noted.  Morbid obesity Estimated body mass index is 49.62 kg/m as calculated from the following:   Height as of this encounter: 5\' 9"  (1.753 m).   Weight as of this encounter: 152.4 kg.  DVT Prophylaxis: On Eliquis Code Status: Full code Family Communication: Discussed with patient Disposition Plan: Hopefully discharge home when improved.     Medications: Scheduled:  apixaban  5 mg Oral BID   ezetimibe  10 mg Oral Daily   famotidine  40 mg Oral Daily   folic acid  1 mg Oral Daily   [START ON 11/11/2022] Vitamin D (Ergocalciferol)  50,000 Units Oral Q Sat   Continuous:  cefTRIAXone (ROCEPHIN)  IV 2 g (11/09/22 1224)   vancomycin 1,250 mg (11/09/22 2225)   JYN:[id number], albuterol, melatonin, midodrine, oxyCODONE, polyethylene glycol, prochlorperazine  Antibiotics: Anti-infectives (From admission, onward)    Start     Dose/Rate Route Frequency Ordered Stop   11/07/22 2200  vancomycin (VANCOREADY) IVPB 1250 mg/250  mL        1,250 mg 166.7 mL/hr over 90 Minutes Intravenous Every 12 hours 11/07/22 0552     11/07/22 0645  vancomycin (VANCOREADY) IVPB 2000 mg/400 mL        2,000 mg 200 mL/hr over 120 Minutes  Intravenous  Once 11/07/22 0552 11/07/22 1036   11/07/22 0600  cefTRIAXone (ROCEPHIN) 2 g in sodium chloride 0.9 % 100 mL IVPB        2 g 200 mL/hr over 30 Minutes Intravenous Daily 11/07/22 0542     11/06/22 2330  clindamycin (CLEOCIN) IVPB 600 mg        600 mg 100 mL/hr over 30 Minutes Intravenous  Once 11/06/22 2322 11/07/22 0128       Objective:  Vital Signs  Vitals:   11/09/22 1943 11/09/22 2040 11/10/22 0600 11/10/22 0751  BP: (!) 166/75  135/65 136/63  Pulse: (!) 110  92 91  Resp: 18   17  Temp: 100.3 F (37.9 C) 98.9 F (37.2 C) 98.6 F (37 C) (!) 100.9 F (38.3 C)  TempSrc: Oral Oral Oral Oral  SpO2: 100%  100% 98%  Weight:      Height:       No intake or output data in the 24 hours ending 11/10/22 0938  Filed Weights   11/06/22 1404  Weight: (!) 152.4 kg    General appearance: Awake alert.  In no distress Resp: Clear to auscultation bilaterally.  Normal effort Cardio: S1-S2 is normal regular.  No S3-S4.  No rubs murmurs or bruit GI: Abdomen is soft.  Nontender nondistended.  Bowel sounds are present normal.  No masses organomegaly Extremities: Lymphedematous changes noted to both lower extremities.  Left leg is larger compared to the right foot is chronic per patient.  Erythema seems to be worsened today.  More warm to touch.  Tender to palpate.  No obvious areas of fluctuation noted.  Lab Results:  Data Reviewed: I have personally reviewed following labs and reports of the imaging studies  CBC: Recent Labs  Lab 11/06/22 1436 11/07/22 0816 11/08/22 0350 11/09/22 0627 11/10/22 0321  WBC 16.7* 16.2* 10.2 8.1 11.5*  NEUTROABS 13.5*  --  6.7  --   --   HGB 13.0 9.7* 9.5* 9.6* 9.4*  HCT 41.5 30.6* 30.0* 30.9* 30.1*  MCV 93.5 92.2 94.3 93.9 94.1  PLT 225 167 175 195 204    Basic Metabolic Panel: Recent Labs  Lab 11/06/22 1436 11/07/22 0816 11/08/22 0350 11/09/22 0627 11/10/22 0321  NA 134* 134* 134* 134* 136  K 4.1 3.5 4.1 4.1 4.1  CL 100  103 106 104 103  CO2 24 24 21* 22 24  GLUCOSE 118* 133* 120* 114* 117*  BUN 21* 26* 21* 10 12  CREATININE 1.29* 1.66* 1.11* 0.87 0.94  CALCIUM 9.3 8.2* 8.0* 8.4* 8.5*  MG  --  2.2  --   --   --   PHOS  --  2.6  --   --   --     GFR: Estimated Creatinine Clearance: 108.8 mL/min (by C-G formula based on SCr of 0.94 mg/dL).  Liver Function Tests: Recent Labs  Lab 11/06/22 1436 11/07/22 0816 11/08/22 0350 11/10/22 0321  AST 13* 11* 17 15  ALT 21 17 21 20   ALKPHOS 52 36* 37* 36*  BILITOT 3.5* 2.3* 1.5* 1.6*  PROT 8.0 6.0* 5.8* 6.0*  ALBUMIN 3.9 2.6* 2.6* 2.5*     Recent Results (from the past 240 hour(s))  Resp panel by RT-PCR (RSV, Flu A&B, Covid) Anterior Nasal Swab     Status: None   Collection Time: 11/06/22  2:36 PM   Specimen: Anterior Nasal Swab  Result Value Ref Range Status   SARS Coronavirus 2 by RT PCR NEGATIVE NEGATIVE Final    Comment: (NOTE) SARS-CoV-2 target nucleic acids are NOT DETECTED.  The SARS-CoV-2 RNA is generally detectable in upper respiratory specimens during the acute phase of infection. The lowest concentration of SARS-CoV-2 viral copies this assay can detect is 138 copies/mL. A negative result does not preclude SARS-Cov-2 infection and should not be used as the sole basis for treatment or other patient management decisions. A negative result may occur with  improper specimen collection/handling, submission of specimen other than nasopharyngeal swab, presence of viral mutation(s) within the areas targeted by this assay, and inadequate number of viral copies(<138 copies/mL). A negative result must be combined with clinical observations, patient history, and epidemiological information. The expected result is Negative.  Fact Sheet for Patients:  BloggerCourse.com  Fact Sheet for Healthcare Providers:  SeriousBroker.it  This test is no t yet approved or cleared by the [place] FDA and   has been authorized for detection and/or diagnosis of SARS-CoV-2 by FDA under an Emergency Use Authorization (EUA). This EUA will remain  in effect (meaning this test can be used) for the duration of the COVID-19 declaration under Section 564(b)(1) of the Act, 21 U.S.C.section 360bbb-3(b)(1), unless the authorization is terminated  or revoked sooner.       Influenza A by PCR NEGATIVE NEGATIVE Final   Influenza B by PCR NEGATIVE NEGATIVE Final    Comment: (NOTE) The Xpert Xpress SARS-CoV-2/FLU/RSV plus assay is intended as an aid in the diagnosis of influenza from Nasopharyngeal swab specimens and should not be used as a sole basis for treatment. Nasal washings and aspirates are unacceptable for Xpert Xpress SARS-CoV-2/FLU/RSV testing.  Fact Sheet for Patients: BloggerCourse.com  Fact Sheet for Healthcare Providers: SeriousBroker.it  This test is not yet approved or cleared by the [place] FDA and has been authorized for detection and/or diagnosis of SARS-CoV-2 by FDA under an Emergency Use Authorization (EUA). This EUA will remain in effect (meaning this test can be used) for the duration of the COVID-19 declaration under Section 564(b)(1) of the Act, 21 U.S.C. section 360bbb-3(b)(1), unless the authorization is terminated or revoked.     Resp Syncytial Virus by PCR NEGATIVE NEGATIVE Final    Comment: (NOTE) Fact Sheet for Patients: BloggerCourse.com  Fact Sheet for Healthcare Providers: SeriousBroker.it  This test is not yet approved or cleared by the [place] FDA and has been authorized for detection and/or diagnosis of SARS-CoV-2 by FDA under an Emergency Use Authorization (EUA). This EUA will remain in effect (meaning this test can be used) for the duration of the COVID-19 declaration under Section 564(b)(1) of the Act, 21 U.S.C. section 360bbb-3(b)(1),  unless the authorization is terminated or revoked.  Performed at [facility], [address], [place], [place] [address]   Culture, blood (routine x 2)     Status: None (Preliminary result)   Collection Time: 11/06/22  9:01 PM   Specimen: BLOOD  Result Value Ref Range Status   Specimen Description   Final    BLOOD LEFT ANTECUBITAL Performed at [facility], [address] [place], [place] [address]    Special Requests   Final    BOTTLES DRAWN AEROBIC AND ANAEROBIC Blood Culture adequate volume Performed at  [facility], [address] [place], [place] [address]    Culture   Final    NO GROWTH 4 DAYS Performed at [facility], [address], [place], [place] [address]    Report Status PENDING  Incomplete  Culture, blood (routine x 2)     Status: None (Preliminary result)   Collection Time: 11/07/22  8:15 AM   Specimen: BLOOD RIGHT ARM  Result Value Ref Range Status   Specimen Description BLOOD RIGHT ARM  Final   Special Requests AEROBIC BOTTLE ONLY Blood Culture adequate volume  Final   Culture   Final    NO GROWTH 3 DAYS Performed at [facility], [address], [place], [place] [address]    Report Status PENDING  Incomplete  Culture, blood (single) w Reflex to ID Panel     Status: None (Preliminary result)   Collection Time: 11/07/22  9:36 AM   Specimen: BLOOD  Result Value Ref Range Status   Specimen Description BLOOD SITE NOT SPECIFIED  Final   Special Requests AEROBIC BOTTLE ONLY Blood Culture adequate volume  Final   Culture   Final    NO GROWTH 3 DAYS Performed at [facility], [address], [place], [place] [address]    Report Status PENDING  Incomplete      Radiology Studies: ECHOCARDIOGRAM COMPLETE  Result Date: 11/08/2022    ECHOCARDIOGRAM REPORT   Patient Name:   JAMERE HALLOWELL Date of Exam: 11/08/2022 Medical Rec #:  [id number]         Height:       69.0 in Accession #:     [id number]        Weight:       336.0 lb Date of Birth:  11/01/1965        BSA:          2.576 m Patient Age:    56 years          BP:           111/66 mmHg Patient Gender: F                 HR:           69 bpm. Exam Location:  Inpatient Procedure: 2D Echo, Cardiac Doppler and Color Doppler Indications:    R01.1 Murmur  History:        Patient has prior history of Echocardiogram examinations, most                 recent 04/08/2021. Abnormal ECG, Signs/Symptoms:Edema, Murmur,                 Shortness of Breath, Dyspnea and Chest Pain; Risk                 Factors:Hypertension.  Sonographer:    Sheralyn Boatman RDCS Referring Phys: [id number] JENNIFER YATES  Sonographer Comments: Patient is obese. IMPRESSIONS  1. Left ventricular ejection fraction, by estimation, is 60 to 65%. The left ventricle has normal function. The left ventricle has no regional wall motion abnormalities. There is mild left ventricular hypertrophy. Left ventricular diastolic parameters were normal. The average left ventricular global longitudinal strain is -20.0 %. The global longitudinal strain is normal.  2. Right ventricular systolic function is normal. The right ventricular size is normal. There is normal pulmonary artery systolic pressure.  3. A small pericardial effusion is present.  4. The mitral valve is normal in structure.  No evidence of mitral valve regurgitation.  5. Very mild aortic valve mean gradient of 9 mmHg. The aortic valve is grossly normal. Aortic valve regurgitation is not visualized.  6. The inferior vena cava is normal in size with greater than 50% respiratory variability, suggesting right atrial pressure of 3 mmHg. Conclusion(s)/Recommendation(s): Normal biventricular function without evidence of hemodynamically significant valvular heart disease. FINDINGS  Left Ventricle: No LVOT obstruction. Left ventricular ejection fraction, by estimation, is 60 to 65%. The left ventricle has normal function. The left ventricle has no regional  wall motion abnormalities. The average left ventricular global longitudinal strain is -20.0 %. The global longitudinal strain is normal. The left ventricular internal cavity size was normal in size. There is mild left ventricular hypertrophy. Left ventricular diastolic parameters were normal. Right Ventricle: The right ventricular size is normal. Right ventricular systolic function is normal. There is normal pulmonary artery systolic pressure. The tricuspid regurgitant velocity is 2.57 m/s, and with an assumed right atrial pressure of 8 mmHg,  the estimated right ventricular systolic pressure is 34.4 mmHg. Left Atrium: Left atrial size was normal in size. Right Atrium: Right atrial size was normal in size. Pericardium: A small pericardial effusion is present. Mitral Valve: The mitral valve is normal in structure. No evidence of mitral valve regurgitation. Tricuspid Valve: Tricuspid valve regurgitation is mild. Aortic Valve: Very mild aortic valve mean gradient of 9 mmHg. The aortic valve is grossly normal. Aortic valve regurgitation is not visualized. Aortic valve mean gradient measures 9.0 mmHg. Aortic valve peak gradient measures 16.5 mmHg. Pulmonic Valve: Pulmonic valve regurgitation is not visualized. Aorta: The aortic root and ascending aorta are structurally normal, with no evidence of dilitation. Venous: The inferior vena cava is normal in size with greater than 50% respiratory variability, suggesting right atrial pressure of 3 mmHg. IAS/Shunts: No atrial level shunt detected by color flow Doppler.  LEFT VENTRICLE PLAX 2D LVIDd:         4.40 cm      Diastology LVIDs:         2.40 cm      LV e' medial:    7.29 cm/s LV PW:         1.10 cm      LV E/e' medial:  13.4 LV IVS:        1.20 cm      LV e' lateral:   8.59 cm/s LVOT diam:     2.20 cm      LV E/e' lateral: 11.4 LVOT Area:     3.80 cm                             2D Longitudinal Strain                             2D Strain GLS Avg:     -20.0 % LV Volumes  (MOD) LV vol d, MOD A2C: 106.0 ml LV vol d, MOD A4C: 59.1 ml LV vol s, MOD A2C: 25.9 ml LV vol s, MOD A4C: 19.1 ml LV SV MOD A2C:     80.1 ml LV SV MOD A4C:     59.1 ml LV SV MOD BP:      59.7 ml RIGHT VENTRICLE             IVC RV S prime:     13.10 cm/s  IVC diam: 1.90 cm TAPSE (  M-mode): 2.4 cm LEFT ATRIUM             Index        RIGHT ATRIUM           Index LA diam:        3.50 cm 1.36 cm/m   RA Area:     16.10 cm LA Vol (A2C):   34.6 ml 13.43 ml/m  RA Volume:   39.70 ml  15.41 ml/m LA Vol (A4C):   55.7 ml 21.63 ml/m LA Biplane Vol: 45.6 ml 17.70 ml/m  AORTIC VALVE               PULMONIC VALVE AV Vmax:      203.00 cm/s  PR End Diast Vel: 1.37 msec AV Vmean:     136.000 cm/s AV VTI:       0.382 m AV Peak Grad: 16.5 mmHg AV Mean Grad: 9.0 mmHg  AORTA Ao Root diam: 3.20 cm Ao Asc diam:  3.10 cm MITRAL VALVE               TRICUSPID VALVE MV Area (PHT): 3.73 cm    TR Peak grad:   26.4 mmHg MV Decel Time: 203 msec    TR Vmax:        257.00 cm/s MV E velocity: 97.80 cm/s MV A velocity: 81.50 cm/s  SHUNTS MV E/A ratio:  1.20        Systemic Diam: 2.20 cm Carolan Clines Electronically signed by Carolan Clines Signature Date/Time: 11/08/2022/2:51:19 PM    Final        LOS: 3 days   Osvaldo Shipper  [facility] Pager on [email or web address]  11/10/2022, 9:38 AM

## 2022-11-10 NOTE — Plan of Care (Signed)
Problem: Education: Goal: Knowledge of General Education information will improve Description: Including pain rating scale, medication(s)/side effects and non-pharmacologic comfort measures Outcome: Progressing   Problem: Clinical Measurements: Goal: Will remain free from infection Outcome: Progressing   Problem: Pain Managment: Goal: General experience of comfort will improve Outcome: Progressing

## 2022-11-10 NOTE — Consult Note (Signed)
[facility]    Date of Admission:  11/06/2022     Reason for Consult: cellulitis/sepsis    Referring Provider: Osvaldo Shipper       Abx: 9/13-c cefazolin   9/10-13 vanc/ceftriaxone        Assessment: 57 yo female morbid obesity, antiphospholipid syndrome, hx pe on eliquis, RA, chronic venous stasis/lymphedema, admitted for acute left leg cellulitis meeting sepsis criteria   Mri no osteomyelitis No open wound  Clinical exam consistent with nonpurulent cellulitis  Lymphedema at risk for longer course of abx requirement  Bcx negative; no sign of nec fasc   No sign of drug fever  Plan: Change abx to cefazolin iv for now If improved over the weekend can switch to oral cefadroxil 1000 mg po bid starting monday and plan a long course at least 3 weeks Will revisit Monday; urgent question for weekend id team if needed Discussed with primary team     ------------------------------------------------ Principal Problem:   Sepsis due to cellulitis [facility]) Active Problems:   Rheumatoid arthritis (HCC)   History of pulmonary embolus (PE)   Morbid obesity (HCC)   Lymphedema   AKI (acute kidney injury) (HCC)   Murmur, cardiac   Essential hypertension   Elevated bilirubin    HPI: [name] is a 56 y.o. female morbid obesity, antiphospholipid syndrome, hx pe on eliquis, RA, chronic venous stasis/lymphedema, admitted for acute left leg cellulitis meeting sepsis criteria    Patient has 1 day of acute redness/pain left leg She has chronic venous stasis  Came on 9/09 and had leukocytosis then without fever On vanc/ceftriaxone Redness still there and 9/12 had fever  9/13 mri LLE no osteomyelitis/abscess  Id consulted for unexpected course   No other localizing s/s of infectious syndrome including pna, colitis  No new rash; lft normal; no aki    Family History  Problem Relation Age of Onset   Heart disease Mother     Hypertension Mother    Hypertension Father    Colon cancer Father 46       Passed away 44 yrs old   Hypertension Sister    Cancer Maternal Grandmother        gastric cancer   Cancer Maternal Grandfather        pancreatic cancer   Colon cancer Paternal Grandmother    Colon cancer Paternal Uncle    Colon polyps Neg Hx     Social History   Tobacco Use   Smoking status: Never   Smokeless tobacco: Never  Vaping Use   Vaping status: Never Used  Substance Use Topics   Alcohol use: No   Drug use: No    Allergies  Allergen Reactions   Abatacept Anaphylaxis    Other reaction(s): Anaphylaxis-Streptomycin Clickject Other reaction(s): Anaphylaxis-Streptomycin   Bee Venom Anaphylaxis   Augmentin [Amoxicillin-Pot Clavulanate] Hives, Itching and Other (See Comments)    Did PCN reaction causing immediate rash, facial/tongue/throat swelling, SOB or lightheadedness with hypotension: yes Did PCN reaction causing severe rash involving mucus membranes or skin necrosis: no Has patient had a PCN reaction that required hospitalization: in hospital Has patient had a PCN reaction occurring within the last 10 years: no If all of the above answers are "NO", then may proceed with Cephalosporin use.  Pt reports no recollection of any reactions when taking penicillin in past   Latex Hives and Other (See Comments)    Local reaction (welts). Patient denies  any wheezing or other reaction with latex exposure   Nulytely [Peg 3350-Kcl-Na Bicarb-Nacl]     NAUSEA AND VOMITING. MAY TOLERATE LOW VOLUME PREP.    Review of Systems: ROS All Other ROS was negative, except mentioned above   Past Medical History:  Diagnosis Date   Anemia    Anxiety    Arthritis    Asthma    hx as child - no prob as adult - no inhaler   Blood transfusion 01/22/11   transfusion 2 units at [facility]   Depression 08/2010   psych assessment   Dyspnea    Fibroid    Headache(784.0)    rx for imitrex - last one jan   Keloid     Lymphedema    Pulmonary embolism (HCC)        Scheduled Meds:  apixaban  5 mg Oral BID   ezetimibe  10 mg Oral Daily   famotidine  40 mg Oral Daily   folic acid  1 mg Oral Daily   [START ON 11/11/2022] Vitamin D (Ergocalciferol)  50,000 Units Oral Q Sat   Continuous Infusions:   ceFAZolin (ANCEF) IV 2 g (11/10/22 1355)   PRN Meds:.acetaminophen, albuterol, melatonin, midodrine, oxyCODONE, polyethylene glycol, prochlorperazine   OBJECTIVE: Blood pressure 136/63, pulse 91, temperature (!) 100.9 F (38.3 C), temperature source Oral, resp. rate 17, height 5\' 9"  (1.753 m), weight (!) 152.4 kg, last menstrual period 01/31/2011, SpO2 98%.  Physical Exam  General/constitutional: no distress, pleasant HEENT: Normocephalic, PER, Conj Clear, EOMI, Oropharynx clear Neck supple CV: rrr no mrg Lungs: clear to auscultation, normal respiratory effort Abd: Soft, Nontender Ext/skin: chronic brawny edema bilateral LE and hyperpigmented hyperkeratosis fibrotic changes of the skin; lle tender warm and erythematous to knee; no open wound or fluctuance Neuro: nonfocal MSK: no peripheral joint swelling/tenderness/warmth; back spines nontender     Lab Results Lab Results  Component Value Date   WBC 11.5 (H) 11/10/2022   HGB 9.4 (L) 11/10/2022   HCT 30.1 (L) 11/10/2022   MCV 94.1 11/10/2022   PLT 204 11/10/2022    Lab Results  Component Value Date   CREATININE 0.94 11/10/2022   BUN 12 11/10/2022   NA 136 11/10/2022   K 4.1 11/10/2022   CL 103 11/10/2022   CO2 24 11/10/2022    Lab Results  Component Value Date   ALT 20 11/10/2022   AST 15 11/10/2022   ALKPHOS 36 (L) 11/10/2022   BILITOT 1.6 (H) 11/10/2022      Microbiology: Recent Results (from the past 240 hour(s))  Resp panel by RT-PCR (RSV, Flu A&B, Covid) Anterior Nasal Swab     Status: None   Collection Time: 11/06/22  2:36 PM   Specimen: Anterior Nasal Swab  Result Value Ref Range Status   SARS Coronavirus 2 by RT  PCR NEGATIVE NEGATIVE Final    Comment: (NOTE) SARS-CoV-2 target nucleic acids are NOT DETECTED.  The SARS-CoV-2 RNA is generally detectable in upper respiratory specimens during the acute phase of infection. The lowest concentration of SARS-CoV-2 viral copies this assay can detect is 138 copies/mL. A negative result does not preclude SARS-Cov-2 infection and should not be used as the sole basis for treatment or other patient management decisions. A negative result may occur with  improper specimen collection/handling, submission of specimen other than nasopharyngeal swab, presence of viral mutation(s) within the areas targeted by this assay, and inadequate number of viral copies(<138 copies/mL). A negative result must be combined with clinical observations, patient  history, and epidemiological information. The expected result is Negative.  Fact Sheet for Patients:  BloggerCourse.com  Fact Sheet for Healthcare Providers:  SeriousBroker.it  This test is no t yet approved or cleared by the [place] FDA and  has been authorized for detection and/or diagnosis of SARS-CoV-2 by FDA under an Emergency Use Authorization (EUA). This EUA will remain  in effect (meaning this test can be used) for the duration of the COVID-19 declaration under Section 564(b)(1) of the Act, 21 U.S.C.section 360bbb-3(b)(1), unless the authorization is terminated  or revoked sooner.       Influenza A by PCR NEGATIVE NEGATIVE Final   Influenza B by PCR NEGATIVE NEGATIVE Final    Comment: (NOTE) The Xpert Xpress SARS-CoV-2/FLU/RSV plus assay is intended as an aid in the diagnosis of influenza from Nasopharyngeal swab specimens and should not be used as a sole basis for treatment. Nasal washings and aspirates are unacceptable for Xpert Xpress SARS-CoV-2/FLU/RSV testing.  Fact Sheet for Patients: BloggerCourse.com  Fact Sheet for  Healthcare Providers: SeriousBroker.it  This test is not yet approved or cleared by the [place] FDA and has been authorized for detection and/or diagnosis of SARS-CoV-2 by FDA under an Emergency Use Authorization (EUA). This EUA will remain in effect (meaning this test can be used) for the duration of the COVID-19 declaration under Section 564(b)(1) of the Act, 21 U.S.C. section 360bbb-3(b)(1), unless the authorization is terminated or revoked.     Resp Syncytial Virus by PCR NEGATIVE NEGATIVE Final    Comment: (NOTE) Fact Sheet for Patients: BloggerCourse.com  Fact Sheet for Healthcare Providers: SeriousBroker.it  This test is not yet approved or cleared by the [place] FDA and has been authorized for detection and/or diagnosis of SARS-CoV-2 by FDA under an Emergency Use Authorization (EUA). This EUA will remain in effect (meaning this test can be used) for the duration of the COVID-19 declaration under Section 564(b)(1) of the Act, 21 U.S.C. section 360bbb-3(b)(1), unless the authorization is terminated or revoked.  Performed at [facility], [address] [place], [place] [address]   Culture, blood (routine x 2)     Status: None (Preliminary result)   Collection Time: 11/06/22  9:01 PM   Specimen: BLOOD  Result Value Ref Range Status   Specimen Description   Final    BLOOD LEFT ANTECUBITAL Performed at [facility], [address] [place], [place] [address]    Special Requests   Final    BOTTLES DRAWN AEROBIC AND ANAEROBIC Blood Culture adequate volume Performed at [facility], [address], [place], [place] [address]    Culture   Final    NO GROWTH 4 DAYS Performed at [facility], [address], [place], [place] [address]    Report Status PENDING  Incomplete  Culture, blood (routine x 2)     Status: None  (Preliminary result)   Collection Time: 11/07/22  8:15 AM   Specimen: BLOOD RIGHT ARM  Result Value Ref Range Status   Specimen Description BLOOD RIGHT ARM  Final   Special Requests AEROBIC BOTTLE ONLY Blood Culture adequate volume  Final   Culture   Final    NO GROWTH 3 DAYS Performed at [facility], [address] [place], [place] [address]    Report Status PENDING  Incomplete  Culture, blood (single) w Reflex to ID Panel     Status: None (Preliminary result)   Collection Time: 11/07/22  9:36  AM   Specimen: BLOOD  Result Value Ref Range Status   Specimen Description BLOOD SITE NOT SPECIFIED  Final   Special Requests AEROBIC BOTTLE ONLY Blood Culture adequate volume  Final   Culture   Final    NO GROWTH 3 DAYS Performed at [facility], [address] [place], [place] [address]    Report Status PENDING  Incomplete     Serology:    Imaging: If present, new imagings (plain films, ct scans, and mri) have been personally visualized and interpreted; radiology reports have been reviewed. Decision making incorporated into the Impression / Recommendations.   9/13 mri left leg 1. Generalized skin thickening throughout the left lower leg with soft tissue edema throughout the subcutaneous fat and to lesser extent in the contralateral right leg. Differential considerations include diffuse cellulitis versus lymphedema. 2. No osteomyelitis of the left lower leg.   9/13 tte  1. Left ventricular ejection fraction, by estimation, is 60 to 65%. The left ventricle has normal function. The left ventricle has no regional wall motion abnormalities. There is mild left ventricular hypertrophy. Left ventricular diastolic parameters  were normal. The average left ventricular global longitudinal strain is -20.0 %. The global longitudinal strain is normal.   2. Right ventricular systolic function is normal. The right ventricular size is normal. There is normal pulmonary artery systolic  pressure.   3. A small pericardial effusion is present.   4. The mitral valve is normal in structure. No evidence of mitral valve regurgitation.   5. Very mild aortic valve mean gradient of 9 mmHg. The aortic valve is grossly normal. Aortic valve regurgitation is not visualized.   6. The inferior vena cava is normal in size with greater than 50% respiratory variability, suggesting right atrial pressure of 3 mmHg.   Conclusion(s)/Recommendation(s): Normal biventricular function without evidence of hemodynamically significant valvular heart disease.     Raymondo Band, MD [facility] [phone] pager    11/10/2022, 2:36 PM

## 2022-11-11 DIAGNOSIS — L0390 Cellulitis, unspecified: Secondary | ICD-10-CM | POA: Diagnosis not present

## 2022-11-11 DIAGNOSIS — I10 Essential (primary) hypertension: Secondary | ICD-10-CM | POA: Diagnosis not present

## 2022-11-11 DIAGNOSIS — A419 Sepsis, unspecified organism: Secondary | ICD-10-CM | POA: Diagnosis not present

## 2022-11-11 LAB — CULTURE, BLOOD (ROUTINE X 2)
Culture: NO GROWTH
Special Requests: ADEQUATE

## 2022-11-11 NOTE — Plan of Care (Signed)
  Problem: Education: Goal: Knowledge of General Education information will improve Description: Including pain rating scale, medication(s)/side effects and non-pharmacologic comfort measures Outcome: Progressing   Problem: Pain Managment: Goal: General experience of comfort will improve Outcome: Progressing   Problem: Safety: Goal: Ability to remain free from injury will improve Outcome: Progressing   

## 2022-11-11 NOTE — Plan of Care (Signed)

## 2022-11-11 NOTE — Plan of Care (Signed)
Problem: Activity: Goal: Risk for activity intolerance will decrease Outcome: Progressing   Problem: Pain Managment: Goal: General experience of comfort will improve Outcome: Progressing   Problem: Safety: Goal: Ability to remain free from injury will improve Outcome: Progressing

## 2022-11-11 NOTE — Progress Notes (Signed)
Mobility Specialist: Progress Note   11/11/22 1225  Mobility  Activity Ambulated independently in room  Level of Assistance Modified independent, requires aide device or extra time  Assistive Device Four wheel walker  Distance Ambulated (ft) 60 ft  Activity Response Tolerated well  Mobility Referral Yes  $Mobility charge 1 Mobility  Mobility Specialist Start Time (ACUTE ONLY) 1011  Mobility Specialist Stop Time (ACUTE ONLY) 1025  Mobility Specialist Time Calculation (min) (ACUTE ONLY) 14 min    Pt was agreeable to mobility session - received in bed. Had c/o LLE pain but stated it was tolerable. Feeling a little better today compared to yesterday. Asymptomatic throughout. Returned to room without fault. Left in bed with all needs met, call bell in reach.   Maurene Capes Mobility Specialist Please contact via SecureChat or Rehab office at [phone]

## 2022-11-11 NOTE — Progress Notes (Signed)
[facility] PROGRESS NOTE   [name] KVQ:[id number] DOB: 1966/02/05 DOA: 11/06/2022  PCP: Claiborne Rigg, NP  Brief History/Interval Summary: [phone] with h/o morbid obesity with lymphedema, antiphospholipid syndrome, PE on Eliquis, and RA who presented on 9/9 with LLE pain and erythema, diagnosed with sepsis due to cellulitis. She was started on Rocephin and Vancomycin.   Consultants: None  Procedures: None    Subjective/Interval History: Patient mentioned that the left leg feels better today.  Less painful.     Assessment/Plan:  Left lower extremity cellulitis/sepsis, present on admission  SIRS criteria in this patient includes: Leukocytosis, tachycardia  Patient has evidence of acute organ failure with recurrent hypotension (SBP < 90 or MAP < 65 x 2 readings) that is not easily explained by another condition. Patient with h/o severe lymphedema of BLE Patient noted to have erythema to the left lower extremity.  Presented with elevated WBC. Patient was given vancomycin and ceftriaxone in the emergency department.  Continued on the same.   Patient started improving but then had worsening symptoms on 9/13.  MRI of the leg did not show any abscess.  Infectious disease was consulted.  She was changed over to cefazolin. Feels better this morning.  Continue IV antibiotics for now. Her left leg is always chronically larger compared to the right.  Doppler studies are negative for DVT. Blood cultures have been negative.  AKI, prerenal secondary to dehydration Baseline creatinine 0.9 with GFR of greater than 60. Noted to have worsening renal failure with rising creatinine to 1.66.   Improved with IV hydration.    Lymphedema Significant baseline lymphedema with ambulatory dysfunction.  Her left leg is chronically larger than right.   Murmur/small pericardial effusion Echocardiogram was done which does not show any significant valvular abnormality.  Small pericardial  effusion was noted.  This was seen previously as well.  Thyroid function tests were normal.  Essential hypertension Antihypertensives currently on hold due to borderline pressures.  Occasional high readings noted.  Normocytic anemia Drop in hemoglobin likely dilutional.  No evidence of overt bleeding.  Hemoglobin has been stable.   History of pulmonary embolism on Eliquis Continue Eliquis.     Rheumatoid arthritis Hold methotrexate, Cimzia   GERD Resume famotidine   Physical debility PT OT assessment Fall precautions.   Isolated elevated T. Bili T. bili 3.5 on presentation.  Possibly due to sepsis.  Improvement noted.  Morbid obesity Estimated body mass index is 49.62 kg/m as calculated from the following:   Height as of this encounter: 5\' 9"  (1.753 m).   Weight as of this encounter: 152.4 kg.  DVT Prophylaxis: On Eliquis Code Status: Full code Family Communication: Discussed with patient Disposition Plan: Anticipate discharge home when improved.     Medications: Scheduled:  apixaban  5 mg Oral BID   ezetimibe  10 mg Oral Daily   famotidine  40 mg Oral Daily   folic acid  1 mg Oral Daily   Vitamin D (Ergocalciferol)  50,000 Units Oral Q Sat   Continuous:   ceFAZolin (ANCEF) IV 2 g (11/11/22 0532)   IRJ:JOACZYSAYTKZS, albuterol, melatonin, midodrine, oxyCODONE, polyethylene glycol, prochlorperazine  Antibiotics: Anti-infectives (From admission, onward)    Start     Dose/Rate Route Frequency Ordered Stop   11/10/22 1400  ceFAZolin (ANCEF) IVPB 2g/100 mL premix        2 g 200 mL/hr over 30 Minutes Intravenous Every 8 hours 11/10/22 1256     11/07/22 2200  vancomycin (VANCOREADY)  IVPB 1250 mg/250 mL  Status:  Discontinued        1,250 mg 166.7 mL/hr over 90 Minutes Intravenous Every 12 hours 11/07/22 0552 11/10/22 1256   11/07/22 0645  vancomycin (VANCOREADY) IVPB 2000 mg/400 mL        2,000 mg 200 mL/hr over 120 Minutes Intravenous  Once 11/07/22 0552 11/07/22  1036   11/07/22 0600  cefTRIAXone (ROCEPHIN) 2 g in sodium chloride 0.9 % 100 mL IVPB  Status:  Discontinued        2 g 200 mL/hr over 30 Minutes Intravenous Daily 11/07/22 0542 11/10/22 1256   11/06/22 2330  clindamycin (CLEOCIN) IVPB 600 mg        600 mg 100 mL/hr over 30 Minutes Intravenous  Once 11/06/22 2322 11/07/22 0128       Objective:  Vital Signs  Vitals:   11/10/22 1457 11/10/22 2205 11/11/22 0529 11/11/22 0815  BP: (!) 127/59 (!) 141/57 132/68 127/60  Pulse: 95 93 91 86  Resp: 16 18 18 18   Temp: 99.4 F (37.4 C) 98.4 F (36.9 C) 98.8 F (37.1 C) 98.8 F (37.1 C)  TempSrc: Oral Oral Oral Oral  SpO2: 100% 98% 99% 99%  Weight:      Height:        Intake/Output Summary (Last 24 hours) at 11/11/2022 0937 Last data filed at 11/11/2022 0734 Gross per 24 hour  Intake 2050.35 ml  Output --  Net 2050.35 ml    Filed Weights   11/06/22 1404  Weight: (!) 152.4 kg    General appearance: Awake alert.  In no distress Resp: Clear to auscultation bilaterally.  Normal effort Cardio: S1-S2 is normal regular.  No S3-S4.  No rubs murmurs or bruit GI: Abdomen is soft.  Nontender nondistended.  Bowel sounds are present normal.  No masses organomegaly Extremities: Improvement in erythema involving the left lower extremity is noted. Neurologic: Alert and oriented x3.  No focal neurological deficits.    Lab Results:  Data Reviewed: I have personally reviewed following labs and reports of the imaging studies  CBC: Recent Labs  Lab 11/06/22 1436 11/07/22 0816 11/08/22 0350 11/09/22 0627 11/10/22 0321  WBC 16.7* 16.2* 10.2 8.1 11.5*  NEUTROABS 13.5*  --  6.7  --   --   HGB 13.0 9.7* 9.5* 9.6* 9.4*  HCT 41.5 30.6* 30.0* 30.9* 30.1*  MCV 93.5 92.2 94.3 93.9 94.1  PLT 225 167 175 195 204    Basic Metabolic Panel: Recent Labs  Lab 11/06/22 1436 11/07/22 0816 11/08/22 0350 11/09/22 0627 11/10/22 0321  NA 134* 134* 134* 134* 136  K 4.1 3.5 4.1 4.1 4.1  CL 100  103 106 104 103  CO2 24 24 21* 22 24  GLUCOSE 118* 133* 120* 114* 117*  BUN 21* 26* 21* 10 12  CREATININE 1.29* 1.66* 1.11* 0.87 0.94  CALCIUM 9.3 8.2* 8.0* 8.4* 8.5*  MG  --  2.2  --   --   --   PHOS  --  2.6  --   --   --     GFR: Estimated Creatinine Clearance: 108.8 mL/min (by C-G formula based on SCr of 0.94 mg/dL).  Liver Function Tests: Recent Labs  Lab 11/06/22 1436 11/07/22 0816 11/08/22 0350 11/10/22 0321  AST 13* 11* 17 15  ALT 21 17 21 20   ALKPHOS 52 36* 37* 36*  BILITOT 3.5* 2.3* 1.5* 1.6*  PROT 8.0 6.0* 5.8* 6.0*  ALBUMIN 3.9 2.6* 2.6* 2.5*  Recent Results (from the past 240 hour(s))  Resp panel by RT-PCR (RSV, Flu A&B, Covid) Anterior Nasal Swab     Status: None   Collection Time: 11/06/22  2:36 PM   Specimen: Anterior Nasal Swab  Result Value Ref Range Status   SARS Coronavirus 2 by RT PCR NEGATIVE NEGATIVE Final    Comment: (NOTE) SARS-CoV-2 target nucleic acids are NOT DETECTED.  The SARS-CoV-2 RNA is generally detectable in upper respiratory specimens during the acute phase of infection. The lowest concentration of SARS-CoV-2 viral copies this assay can detect is 138 copies/mL. A negative result does not preclude SARS-Cov-2 infection and should not be used as the sole basis for treatment or other patient management decisions. A negative result may occur with  improper specimen collection/handling, submission of specimen other than nasopharyngeal swab, presence of viral mutation(s) within the areas targeted by this assay, and inadequate number of viral copies(<138 copies/mL). A negative result must be combined with clinical observations, patient history, and epidemiological information. The expected result is Negative.  Fact Sheet for Patients:  BloggerCourse.com  Fact Sheet for Healthcare Providers:  SeriousBroker.it  This test is no t yet approved or cleared by the [place] FDA and   has been authorized for detection and/or diagnosis of SARS-CoV-2 by FDA under an Emergency Use Authorization (EUA). This EUA will remain  in effect (meaning this test can be used) for the duration of the COVID-19 declaration under Section 564(b)(1) of the Act, 21 U.S.C.section 360bbb-3(b)(1), unless the authorization is terminated  or revoked sooner.       Influenza A by PCR NEGATIVE NEGATIVE Final   Influenza B by PCR NEGATIVE NEGATIVE Final    Comment: (NOTE) The Xpert Xpress SARS-CoV-2/FLU/RSV plus assay is intended as an aid in the diagnosis of influenza from Nasopharyngeal swab specimens and should not be used as a sole basis for treatment. Nasal washings and aspirates are unacceptable for Xpert Xpress SARS-CoV-2/FLU/RSV testing.  Fact Sheet for Patients: BloggerCourse.com  Fact Sheet for Healthcare Providers: SeriousBroker.it  This test is not yet approved or cleared by the [place] FDA and has been authorized for detection and/or diagnosis of SARS-CoV-2 by FDA under an Emergency Use Authorization (EUA). This EUA will remain in effect (meaning this test can be used) for the duration of the COVID-19 declaration under Section 564(b)(1) of the Act, 21 U.S.C. section 360bbb-3(b)(1), unless the authorization is terminated or revoked.     Resp Syncytial Virus by PCR NEGATIVE NEGATIVE Final    Comment: (NOTE) Fact Sheet for Patients: BloggerCourse.com  Fact Sheet for Healthcare Providers: SeriousBroker.it  This test is not yet approved or cleared by the [place] FDA and has been authorized for detection and/or diagnosis of SARS-CoV-2 by FDA under an Emergency Use Authorization (EUA). This EUA will remain in effect (meaning this test can be used) for the duration of the COVID-19 declaration under Section 564(b)(1) of the Act, 21 U.S.C. section 360bbb-3(b)(1),  unless the authorization is terminated or revoked.  Performed at [facility], [address], [place], [place] [address]   Culture, blood (routine x 2)     Status: None   Collection Time: 11/06/22  9:01 PM   Specimen: BLOOD  Result Value Ref Range Status   Specimen Description   Final    BLOOD LEFT ANTECUBITAL Performed at [facility], [address] [place], [place] [address]    Special Requests   Final    BOTTLES DRAWN AEROBIC AND ANAEROBIC  Blood Culture adequate volume Performed at [facility], [address], [place], [place] [address]    Culture   Final    NO GROWTH 5 DAYS Performed at [facility], [address] [place], [place] [address]    Report Status 11/11/2022 FINAL  Final  Culture, blood (routine x 2)     Status: None (Preliminary result)   Collection Time: 11/07/22  8:15 AM   Specimen: BLOOD RIGHT ARM  Result Value Ref Range Status   Specimen Description BLOOD RIGHT ARM  Final   Special Requests AEROBIC BOTTLE ONLY Blood Culture adequate volume  Final   Culture   Final    NO GROWTH 4 DAYS Performed at [facility], [address] [place], [place] [address]    Report Status PENDING  Incomplete  Culture, blood (single) w Reflex to ID Panel     Status: None (Preliminary result)   Collection Time: 11/07/22  9:36 AM   Specimen: BLOOD  Result Value Ref Range Status   Specimen Description BLOOD SITE NOT SPECIFIED  Final   Special Requests AEROBIC BOTTLE ONLY Blood Culture adequate volume  Final   Culture   Final    NO GROWTH 4 DAYS Performed at [facility], [address] [place], [place] [address]    Report Status PENDING  Incomplete      Radiology Studies: MR TIBIA FIBULA LEFT W WO CONTRAST  Result Date: 11/10/2022 CLINICAL DATA:  Morbid obesity, lymphedema, cellulitis EXAM: MRI OF LOWER LEFT EXTREMITY WITHOUT AND WITH CONTRAST TECHNIQUE: Multiplanar, multisequence MR  imaging of the left lower leg was performed both before and after administration of intravenous contrast. CONTRAST:  10mL GADAVIST GADOBUTROL 1 MMOL/ML IV SOLN COMPARISON:  None Available. FINDINGS: Bones/Joint/Cartilage No fracture or dislocation. Normal alignment. No joint effusion. No marrow signal abnormality. Severe osteoarthritis of the left medial femorotibial compartments. Muscles and Tendons Mild generalized muscle atrophy throughout the left lower leg. Soft tissue Generalized skin thickening throughout the left lower leg with soft tissue edema throughout the subcutaneous fat and to lesser extent in the contralateral right leg. Differential considerations include diffuse cellulitis versus lymphedema. No soft tissue mass. IMPRESSION: 1. Generalized skin thickening throughout the left lower leg with soft tissue edema throughout the subcutaneous fat and to lesser extent in the contralateral right leg. Differential considerations include diffuse cellulitis versus lymphedema. 2. No osteomyelitis of the left lower leg. Electronically Signed   By: Elige Ko M.D.   On: 11/10/2022 11:32       LOS: 4 days   Keiron Iodice Rito Ehrlich  [facility] Pager on [email or web address]  11/11/2022, 9:37 AM

## 2022-11-12 DIAGNOSIS — I10 Essential (primary) hypertension: Secondary | ICD-10-CM | POA: Diagnosis not present

## 2022-11-12 DIAGNOSIS — A419 Sepsis, unspecified organism: Secondary | ICD-10-CM | POA: Diagnosis not present

## 2022-11-12 DIAGNOSIS — L0390 Cellulitis, unspecified: Secondary | ICD-10-CM | POA: Diagnosis not present

## 2022-11-12 LAB — BASIC METABOLIC PANEL
Anion gap: 5 (ref 5–15)
BUN: 8 mg/dL (ref 6–20)
CO2: 25 mmol/L (ref 22–32)
Calcium: 8.6 mg/dL — ABNORMAL LOW (ref 8.9–10.3)
Chloride: 104 mmol/L (ref 98–111)
Creatinine, Ser: 0.91 mg/dL (ref 0.44–1.00)
GFR, Estimated: >60 mL/min (ref >60)
Glucose, Bld: 109 mg/dL — ABNORMAL HIGH (ref 70–99)
Potassium: 3.7 mmol/L (ref 3.5–5.1)
Sodium: 134 mmol/L — ABNORMAL LOW (ref 135–145)

## 2022-11-12 LAB — CBC
HCT: 29.5 % — ABNORMAL LOW (ref 36.0–46.0)
Hemoglobin: 9 g/dL — ABNORMAL LOW (ref 12.0–15.0)
MCH: 29 pg (ref 26.0–34.0)
MCHC: 30.5 g/dL (ref 30.0–36.0)
MCV: 95.2 fL (ref 80.0–100.0)
Platelets: 244 10*3/uL (ref 150–400)
RBC: 3.1 MIL/uL — ABNORMAL LOW (ref 3.87–5.11)
RDW: 16.5 % — ABNORMAL HIGH (ref 11.5–15.5)
WBC: 10.8 10*3/uL — ABNORMAL HIGH (ref 4.0–10.5)
nRBC: 0 % (ref 0.0–0.2)

## 2022-11-12 LAB — CULTURE, BLOOD (ROUTINE X 2)
Culture: NO GROWTH
Special Requests: ADEQUATE

## 2022-11-12 LAB — CULTURE, BLOOD (SINGLE)
Culture: NO GROWTH
Special Requests: ADEQUATE

## 2022-11-12 MED ORDER — OXYCODONE HCL 5 MG PO TABS
5.0000 mg | ORAL_TABLET | ORAL | Status: DC | PRN
  Administered 2022-11-12 – 2022-11-15 (×5): 5 mg via ORAL
  Filled 2022-11-12 (×5): qty 1

## 2022-11-12 NOTE — Progress Notes (Signed)
Mobility Specialist: Progress Note   11/12/22 1616  Mobility  Activity Ambulated independently in hallway  Level of Assistance Modified independent, requires aide device or extra time  Assistive Device Four wheel walker  Distance Ambulated (ft) 120 ft  Activity Response Tolerated well  Mobility Referral Yes  $Mobility charge 1 Mobility  Mobility Specialist Start Time (ACUTE ONLY) 1026  Mobility Specialist Stop Time (ACUTE ONLY) 1038  Mobility Specialist Time Calculation (min) (ACUTE ONLY) 12 min    Pt was agreeable to mobility session - received in bed. Had c/o LLE pain but tolerable. Returned to room without fault. Left in chair with all needs met, call bell in reach.   Maurene Capes Mobility Specialist Please contact via SecureChat or Rehab office at [phone]

## 2022-11-12 NOTE — Plan of Care (Signed)
Problem: Activity: Goal: Risk for activity intolerance will decrease Outcome: Progressing   Problem: Elimination: Goal: Will not experience complications related to bowel motility Outcome: Progressing Goal: Will not experience complications related to urinary retention Outcome: Progressing   Problem: Pain Managment: Goal: General experience of comfort will improve Outcome: Progressing   Problem: Safety: Goal: Ability to remain free from injury will improve Outcome: Progressing   Problem: Skin Integrity: Goal: Risk for impaired skin integrity will decrease Outcome: Progressing

## 2022-11-12 NOTE — Progress Notes (Signed)
[facility] PROGRESS NOTE   SKYLEY WI NWG:[id number] DOB: [birth year] DOA: 11/06/2022  PCP: Claiborne Rigg, NP  Brief History/Interval Summary: [phone] with h/o morbid obesity with lymphedema, antiphospholipid syndrome, PE on Eliquis, and RA who presented on 9/9 with LLE pain and erythema, diagnosed with sepsis due to cellulitis. She was started on Rocephin and Vancomycin.   Consultants: None  Procedures: None    Subjective/Interval History: Patient feels better.  Not having as much pain in the left leg as before.  No new complaints offered.     Assessment/Plan:  Left lower extremity cellulitis/sepsis, present on admission  SIRS criteria in this patient includes: Leukocytosis, tachycardia  Patient has evidence of acute organ failure with recurrent hypotension (SBP < 90 or MAP < 65 x 2 readings) that is not easily explained by another condition. Patient with h/o severe lymphedema of BLE Patient noted to have erythema to the left lower extremity.  Presented with elevated WBC. Patient was given vancomycin and ceftriaxone in the emergency department.  Continued on the same.  Initially started improving but then her symptoms worsened.  MRI was done which did not reveal any abscess.   Seen by infectious disease.  Changed over to cefazolin.  Seems to be slowly improving.  Continue IV antibiotics for today.  ID to reevaluate tomorrow. Her left leg is always chronically larger compared to the right.  Doppler studies are negative for DVT. Blood cultures have been negative.  AKI, prerenal secondary to dehydration Baseline creatinine 0.9 with GFR of greater than 60. Noted to have worsening renal failure with rising creatinine to 1.66.   Improved with IV hydration.    Lymphedema Significant baseline lymphedema with ambulatory dysfunction.  Her left leg is chronically larger than right.   Murmur/small pericardial effusion Echocardiogram was done which does not show any  significant valvular abnormality.  Small pericardial effusion was noted.  This was seen previously as well.  Thyroid function tests were normal.  Essential hypertension Antihypertensives currently on hold due to borderline pressures.  Occasional high readings noted.  Normocytic anemia Drop in hemoglobin likely dilutional.  No evidence of overt bleeding.  Hemoglobin has been stable.   History of pulmonary embolism on Eliquis Continue Eliquis.     Rheumatoid arthritis Hold methotrexate, Cimzia   GERD Resume famotidine   Physical debility PT OT assessment Fall precautions.   Isolated elevated T. Bili T. bili 3.5 on presentation.  Possibly due to sepsis.  Improvement noted.  Morbid obesity Estimated body mass index is 49.62 kg/m as calculated from the following:   Height as of this encounter: 5\' 9"  (1.753 m).   Weight as of this encounter: 152.4 kg.  DVT Prophylaxis: On Eliquis Code Status: Full code Family Communication: Discussed with patient Disposition Plan: Anticipate discharge home when improved.     Medications: Scheduled:  apixaban  5 mg Oral BID   ezetimibe  10 mg Oral Daily   famotidine  40 mg Oral Daily   folic acid  1 mg Oral Daily   Vitamin D (Ergocalciferol)  50,000 Units Oral Q Sat   Continuous:   ceFAZolin (ANCEF) IV 2 g (11/12/22 0527)   ONG:[id number], albuterol, melatonin, midodrine, oxyCODONE, polyethylene glycol, prochlorperazine  Antibiotics: Anti-infectives (From admission, onward)    Start     Dose/Rate Route Frequency Ordered Stop   11/10/22 1400  ceFAZolin (ANCEF) IVPB 2g/100 mL premix        2 g 200 mL/hr over 30 Minutes Intravenous Every 8 hours  11/10/22 1256     11/07/22 2200  vancomycin (VANCOREADY) IVPB 1250 mg/250 mL  Status:  Discontinued        1,250 mg 166.7 mL/hr over 90 Minutes Intravenous Every 12 hours 11/07/22 0552 11/10/22 1256   11/07/22 0645  vancomycin (VANCOREADY) IVPB 2000 mg/400 mL        2,000 mg 200 mL/hr over  120 Minutes Intravenous  Once 11/07/22 0552 11/07/22 1036   11/07/22 0600  cefTRIAXone (ROCEPHIN) 2 g in sodium chloride 0.9 % 100 mL IVPB  Status:  Discontinued        2 g 200 mL/hr over 30 Minutes Intravenous Daily 11/07/22 0542 11/10/22 1256   11/06/22 2330  clindamycin (CLEOCIN) IVPB 600 mg        600 mg 100 mL/hr over 30 Minutes Intravenous  Once 11/06/22 2322 11/07/22 0128       Objective:  Vital Signs  Vitals:   11/11/22 2033 11/12/22 0113 11/12/22 0510 11/12/22 0842  BP: (!) 148/76 116/67 128/63 (!) 127/57  Pulse: 91 98 86 86  Resp: 18  16 18   Temp: 100 F (37.8 C) 98.4 F (36.9 C)  98 F (36.7 C)  TempSrc: Oral Oral    SpO2: 100% 99% 100% 100%  Weight:      Height:        Intake/Output Summary (Last 24 hours) at 11/12/2022 0909 Last data filed at 11/11/2022 1505 Gross per 24 hour  Intake 700 ml  Output --  Net 700 ml    Filed Weights   11/06/22 1404  Weight: (!) 152.4 kg    General appearance: Awake alert.  In no distress Resp: Clear to auscultation bilaterally.  Normal effort Cardio: S1-S2 is normal regular.  No S3-S4.  No rubs murmurs or bruit GI: Abdomen is soft.  Nontender nondistended.  Bowel sounds are present normal.  No masses organomegaly Extremities: Improvement in erythema.  Chronic lymphedema is noted.  Left leg is larger than right which is chronic.   Lab Results:  Data Reviewed: I have personally reviewed following labs and reports of the imaging studies  CBC: Recent Labs  Lab 11/06/22 1436 11/07/22 0816 11/08/22 0350 11/09/22 0627 11/10/22 0321 11/12/22 0350  WBC 16.7* 16.2* 10.2 8.1 11.5* 10.8*  NEUTROABS 13.5*  --  6.7  --   --   --   HGB 13.0 9.7* 9.5* 9.6* 9.4* 9.0*  HCT 41.5 30.6* 30.0* 30.9* 30.1* 29.5*  MCV 93.5 92.2 94.3 93.9 94.1 95.2  PLT 225 167 175 195 204 244    Basic Metabolic Panel: Recent Labs  Lab 11/07/22 0816 11/08/22 0350 11/09/22 0627 11/10/22 0321 11/12/22 0350  NA 134* 134* 134* 136 134*  K  3.5 4.1 4.1 4.1 3.7  CL 103 106 104 103 104  CO2 24 21* 22 24 25   GLUCOSE 133* 120* 114* 117* 109*  BUN 26* 21* 10 12 8   CREATININE 1.66* 1.11* 0.87 0.94 0.91  CALCIUM 8.2* 8.0* 8.4* 8.5* 8.6*  MG 2.2  --   --   --   --   PHOS 2.6  --   --   --   --     GFR: Estimated Creatinine Clearance: 112.3 mL/min (by C-G formula based on SCr of 0.91 mg/dL).  Liver Function Tests: Recent Labs  Lab 11/06/22 1436 11/07/22 0816 11/08/22 0350 11/10/22 0321  AST 13* 11* 17 15  ALT 21 17 21 20   ALKPHOS 52 36* 37* 36*  BILITOT 3.5* 2.3* 1.5* 1.6*  PROT 8.0 6.0* 5.8* 6.0*  ALBUMIN 3.9 2.6* 2.6* 2.5*     Recent Results (from the past 240 hour(s))  Resp panel by RT-PCR (RSV, Flu A&B, Covid) Anterior Nasal Swab     Status: None   Collection Time: 11/06/22  2:36 PM   Specimen: Anterior Nasal Swab  Result Value Ref Range Status   SARS Coronavirus 2 by RT PCR NEGATIVE NEGATIVE Final    Comment: (NOTE) SARS-CoV-2 target nucleic acids are NOT DETECTED.  The SARS-CoV-2 RNA is generally detectable in upper respiratory specimens during the acute phase of infection. The lowest concentration of SARS-CoV-2 viral copies this assay can detect is 138 copies/mL. A negative result does not preclude SARS-Cov-2 infection and should not be used as the sole basis for treatment or other patient management decisions. A negative result may occur with  improper specimen collection/handling, submission of specimen other than nasopharyngeal swab, presence of viral mutation(s) within the areas targeted by this assay, and inadequate number of viral copies(<138 copies/mL). A negative result must be combined with clinical observations, patient history, and epidemiological information. The expected result is Negative.  Fact Sheet for Patients:  BloggerCourse.com  Fact Sheet for Healthcare Providers:  SeriousBroker.it  This test is no t yet approved or cleared by  the [place] FDA and  has been authorized for detection and/or diagnosis of SARS-CoV-2 by FDA under an Emergency Use Authorization (EUA). This EUA will remain  in effect (meaning this test can be used) for the duration of the COVID-19 declaration under Section 564(b)(1) of the Act, 21 U.S.C.section 360bbb-3(b)(1), unless the authorization is terminated  or revoked sooner.       Influenza A by PCR NEGATIVE NEGATIVE Final   Influenza B by PCR NEGATIVE NEGATIVE Final    Comment: (NOTE) The Xpert Xpress SARS-CoV-2/FLU/RSV plus assay is intended as an aid in the diagnosis of influenza from Nasopharyngeal swab specimens and should not be used as a sole basis for treatment. Nasal washings and aspirates are unacceptable for Xpert Xpress SARS-CoV-2/FLU/RSV testing.  Fact Sheet for Patients: BloggerCourse.com  Fact Sheet for Healthcare Providers: SeriousBroker.it  This test is not yet approved or cleared by the [place] FDA and has been authorized for detection and/or diagnosis of SARS-CoV-2 by FDA under an Emergency Use Authorization (EUA). This EUA will remain in effect (meaning this test can be used) for the duration of the COVID-19 declaration under Section 564(b)(1) of the Act, 21 U.S.C. section 360bbb-3(b)(1), unless the authorization is terminated or revoked.     Resp Syncytial Virus by PCR NEGATIVE NEGATIVE Final    Comment: (NOTE) Fact Sheet for Patients: BloggerCourse.com  Fact Sheet for Healthcare Providers: SeriousBroker.it  This test is not yet approved or cleared by the [place] FDA and has been authorized for detection and/or diagnosis of SARS-CoV-2 by FDA under an Emergency Use Authorization (EUA). This EUA will remain in effect (meaning this test can be used) for the duration of the COVID-19 declaration under Section 564(b)(1) of the Act, 21  U.S.C. section 360bbb-3(b)(1), unless the authorization is terminated or revoked.  Performed at [facility], [address] [place], [place] [address]   Culture, blood (routine x 2)     Status: None   Collection Time: 11/06/22  9:01 PM   Specimen: BLOOD  Result Value Ref Range Status   Specimen Description   Final    BLOOD LEFT ANTECUBITAL Performed at [facility], [address] [place], [place] [address]  Special Requests   Final    BOTTLES DRAWN AEROBIC AND ANAEROBIC Blood Culture adequate volume Performed at [facility], [address] [place], [place] [address]    Culture   Final    NO GROWTH 5 DAYS Performed at [facility], [address], [place], [place] [address]    Report Status 11/11/2022 FINAL  Final  Culture, blood (routine x 2)     Status: None   Collection Time: 11/07/22  8:15 AM   Specimen: BLOOD RIGHT ARM  Result Value Ref Range Status   Specimen Description BLOOD RIGHT ARM  Final   Special Requests AEROBIC BOTTLE ONLY Blood Culture adequate volume  Final   Culture   Final    NO GROWTH 5 DAYS Performed at [facility], [address] [place], [place] [address]    Report Status 11/12/2022 FINAL  Final  Culture, blood (single) w Reflex to ID Panel     Status: None   Collection Time: 11/07/22  9:36 AM   Specimen: BLOOD  Result Value Ref Range Status   Specimen Description BLOOD SITE NOT SPECIFIED  Final   Special Requests AEROBIC BOTTLE ONLY Blood Culture adequate volume  Final   Culture   Final    NO GROWTH 5 DAYS Performed at [facility], [address] [place], [place] [address]    Report Status 11/12/2022 FINAL  Final      Radiology Studies: MR TIBIA FIBULA LEFT W WO CONTRAST  Result Date: 11/10/2022 CLINICAL DATA:  Morbid obesity, lymphedema, cellulitis EXAM: MRI OF LOWER LEFT EXTREMITY WITHOUT AND WITH CONTRAST TECHNIQUE: Multiplanar, multisequence MR imaging  of the left lower leg was performed both before and after administration of intravenous contrast. CONTRAST:  10mL GADAVIST GADOBUTROL 1 MMOL/ML IV SOLN COMPARISON:  None Available. FINDINGS: Bones/Joint/Cartilage No fracture or dislocation. Normal alignment. No joint effusion. No marrow signal abnormality. Severe osteoarthritis of the left medial femorotibial compartments. Muscles and Tendons Mild generalized muscle atrophy throughout the left lower leg. Soft tissue Generalized skin thickening throughout the left lower leg with soft tissue edema throughout the subcutaneous fat and to lesser extent in the contralateral right leg. Differential considerations include diffuse cellulitis versus lymphedema. No soft tissue mass. IMPRESSION: 1. Generalized skin thickening throughout the left lower leg with soft tissue edema throughout the subcutaneous fat and to lesser extent in the contralateral right leg. Differential considerations include diffuse cellulitis versus lymphedema. 2. No osteomyelitis of the left lower leg. Electronically Signed   By: Elige Ko M.D.   On: 11/10/2022 11:32       LOS: 5 days   [name]  [facility] Pager on [email or web address]  11/12/2022, 9:09 AM

## 2022-11-13 ENCOUNTER — Encounter: Payer: Medicare HMO | Admitting: Occupational Therapy

## 2022-11-13 ENCOUNTER — Other Ambulatory Visit (HOSPITAL_COMMUNITY): Payer: Self-pay

## 2022-11-13 DIAGNOSIS — L0390 Cellulitis, unspecified: Secondary | ICD-10-CM | POA: Diagnosis not present

## 2022-11-13 DIAGNOSIS — I10 Essential (primary) hypertension: Secondary | ICD-10-CM | POA: Diagnosis not present

## 2022-11-13 DIAGNOSIS — L03116 Cellulitis of left lower limb: Secondary | ICD-10-CM | POA: Diagnosis not present

## 2022-11-13 DIAGNOSIS — I890 Lymphedema, not elsewhere classified: Secondary | ICD-10-CM | POA: Diagnosis not present

## 2022-11-13 DIAGNOSIS — A419 Sepsis, unspecified organism: Secondary | ICD-10-CM | POA: Diagnosis not present

## 2022-11-13 MED ORDER — CEFADROXIL 500 MG PO CAPS
1000.0000 mg | ORAL_CAPSULE | Freq: Two times a day (BID) | ORAL | 0 refills | Status: DC
  Filled 2022-11-13: qty 84, 21d supply, fill #0

## 2022-11-13 MED ORDER — CEFADROXIL 500 MG PO CAPS
1000.0000 mg | ORAL_CAPSULE | Freq: Two times a day (BID) | ORAL | Status: DC
  Administered 2022-11-13 – 2022-11-16 (×7): 1000 mg via ORAL
  Filled 2022-11-13 (×7): qty 2

## 2022-11-13 NOTE — Plan of Care (Signed)
  Problem: Activity: Goal: Risk for activity intolerance will decrease Outcome: Progressing   Problem: Coping: Goal: Level of anxiety will decrease Outcome: Progressing   Problem: Pain Managment: Goal: General experience of comfort will improve Outcome: Progressing   Problem: Safety: Goal: Ability to remain free from injury will improve Outcome: Progressing   

## 2022-11-13 NOTE — Progress Notes (Signed)
[facility]  Date of Admission:  11/06/2022       ASSESSMENT: Cellulitis Lymphedema  Improving on iv cefazolin  PLAN: Anticipate prolonged course of abx and slow recovery Advise elevation of left leg above heart level when non-ambulatory Would resume unna boot wrap end of this week or next week Stop iv abx today Start cefadroxil 1000 mg po bid and can do 3 more weeks starting today 9/16 Clinic f/u 10/03 @ 4pm with me Id will sign off Ok to discharge from our standpoint Discussed with primary team   Principal Problem:   Sepsis due to cellulitis (HCC) Active Problems:   Rheumatoid arthritis (HCC)   History of pulmonary embolus (PE)   Morbid obesity (HCC)   Lymphedema   AKI (acute kidney injury) (HCC)   Murmur, cardiac   Essential hypertension   Elevated bilirubin   Allergies  Allergen Reactions   Abatacept Anaphylaxis    Other reaction(s): Anaphylaxis-Streptomycin Clickject Other reaction(s): Anaphylaxis-Streptomycin   Bee Venom Anaphylaxis   Augmentin [Amoxicillin-Pot Clavulanate] Hives, Itching and Other (See Comments)    Did PCN reaction causing immediate rash, facial/tongue/throat swelling, SOB or lightheadedness with hypotension: yes Did PCN reaction causing severe rash involving mucus membranes or skin necrosis: no Has patient had a PCN reaction that required hospitalization: in hospital Has patient had a PCN reaction occurring within the last 10 years: no If all of the above answers are "NO", then may proceed with Cephalosporin use.  Pt reports no recollection of any reactions when taking penicillin in past   Latex Hives and Other (See Comments)    Local reaction (welts). Patient denies any wheezing or other reaction with latex exposure   Nulytely [Peg 3350-Kcl-Na Bicarb-Nacl]     NAUSEA AND VOMITING. MAY TOLERATE LOW VOLUME PREP.    Scheduled Meds:  apixaban  5 mg Oral BID   ezetimibe  10 mg Oral Daily    famotidine  40 mg Oral Daily   folic acid  1 mg Oral Daily   Vitamin D (Ergocalciferol)  50,000 Units Oral Q Sat   Continuous Infusions:   ceFAZolin (ANCEF) IV 2 g (11/13/22 0507)   PRN Meds:.acetaminophen, albuterol, melatonin, midodrine, oxyCODONE, polyethylene glycol, prochlorperazine   SUBJECTIVE: Improving but still hurting No f/c No n/v/diarrhea  Review of Systems: ROS All other ROS was negative, except mentioned above     OBJECTIVE: Vitals:   11/12/22 1723 11/12/22 2104 11/13/22 0504 11/13/22 0749  BP: (!) 143/80 (!) 116/57 (!) 125/57 120/63  Pulse: 95 98 97 95  Resp: 18 18  18   Temp: 98 F (36.7 C) 98.9 F (37.2 C) 99.8 F (37.7 C) (!) 100.4 F (38 C)  TempSrc:  Oral Oral Oral  SpO2: 100% 98% 97% 98%  Weight:      Height:       Body mass index is 49.62 kg/m.  Physical Exam  No distress, sitting in chair Bilateral LE chronic edema; erythema decreased/almost resolved left lower ext, but still mild-moderate tenderness No purulence Normal respiratory effort Abd s/nt Neuro nonfocal   Lab Results Lab Results  Component Value Date   WBC 10.8 (H) 11/12/2022   HGB 9.0 (L) 11/12/2022   HCT 29.5 (L) 11/12/2022   MCV 95.2 11/12/2022   PLT 244 11/12/2022    Lab Results  Component Value Date   CREATININE 0.91 11/12/2022   BUN 8 11/12/2022   NA 134 (L) 11/12/2022   K 3.7 11/12/2022  CL 104 11/12/2022   CO2 25 11/12/2022    Lab Results  Component Value Date   ALT 20 11/10/2022   AST 15 11/10/2022   ALKPHOS 36 (L) 11/10/2022   BILITOT 1.6 (H) 11/10/2022      Microbiology: Recent Results (from the past 240 hour(s))  Resp panel by RT-PCR (RSV, Flu A&B, Covid) Anterior Nasal Swab     Status: None   Collection Time: 11/06/22  2:36 PM   Specimen: Anterior Nasal Swab  Result Value Ref Range Status   SARS Coronavirus 2 by RT PCR NEGATIVE NEGATIVE Final    Comment: (NOTE) SARS-CoV-2 target nucleic acids are NOT DETECTED.  The SARS-CoV-2 RNA is  generally detectable in upper respiratory specimens during the acute phase of infection. The lowest concentration of SARS-CoV-2 viral copies this assay can detect is 138 copies/mL. A negative result does not preclude SARS-Cov-2 infection and should not be used as the sole basis for treatment or other patient management decisions. A negative result may occur with  improper specimen collection/handling, submission of specimen other than nasopharyngeal swab, presence of viral mutation(s) within the areas targeted by this assay, and inadequate number of viral copies(<138 copies/mL). A negative result must be combined with clinical observations, patient history, and epidemiological information. The expected result is Negative.  Fact Sheet for Patients:  BloggerCourse.com  Fact Sheet for Healthcare Providers:  SeriousBroker.it  This test is no t yet approved or cleared by the [place] FDA and  has been authorized for detection and/or diagnosis of SARS-CoV-2 by FDA under an Emergency Use Authorization (EUA). This EUA will remain  in effect (meaning this test can be used) for the duration of the COVID-19 declaration under Section 564(b)(1) of the Act, 21 U.S.C.section 360bbb-3(b)(1), unless the authorization is terminated  or revoked sooner.       Influenza A by PCR NEGATIVE NEGATIVE Final   Influenza B by PCR NEGATIVE NEGATIVE Final    Comment: (NOTE) The Xpert Xpress SARS-CoV-2/FLU/RSV plus assay is intended as an aid in the diagnosis of influenza from Nasopharyngeal swab specimens and should not be used as a sole basis for treatment. Nasal washings and aspirates are unacceptable for Xpert Xpress SARS-CoV-2/FLU/RSV testing.  Fact Sheet for Patients: BloggerCourse.com  Fact Sheet for Healthcare Providers: SeriousBroker.it  This test is not yet approved or cleared by the [place] FDA and has been authorized for detection and/or diagnosis of SARS-CoV-2 by FDA under an Emergency Use Authorization (EUA). This EUA will remain in effect (meaning this test can be used) for the duration of the COVID-19 declaration under Section 564(b)(1) of the Act, 21 U.S.C. section 360bbb-3(b)(1), unless the authorization is terminated or revoked.     Resp Syncytial Virus by PCR NEGATIVE NEGATIVE Final    Comment: (NOTE) Fact Sheet for Patients: BloggerCourse.com  Fact Sheet for Healthcare Providers: SeriousBroker.it  This test is not yet approved or cleared by the [place] FDA and has been authorized for detection and/or diagnosis of SARS-CoV-2 by FDA under an Emergency Use Authorization (EUA). This EUA will remain in effect (meaning this test can be used) for the duration of the COVID-19 declaration under Section 564(b)(1) of the Act, 21 U.S.C. section 360bbb-3(b)(1), unless the authorization is terminated or revoked.  Performed at [facility], [address] [place], [place] [address]   Culture, blood (routine x 2)     Status: None   Collection Time: 11/06/22  9:01 PM   Specimen: BLOOD  Result Value  Ref Range Status   Specimen Description   Final    BLOOD LEFT ANTECUBITAL Performed at [facility], [address] [place], [place] [address]    Special Requests   Final    BOTTLES DRAWN AEROBIC AND ANAEROBIC Blood Culture adequate volume Performed at [facility], [address], [place], [place] [address]    Culture   Final    NO GROWTH 5 DAYS Performed at [facility], [address] [place], [place] [address]    Report Status 11/11/2022 FINAL  Final  Culture, blood (routine x 2)     Status: None   Collection Time: 11/07/22  8:15 AM   Specimen: BLOOD RIGHT ARM  Result Value Ref Range Status   Specimen Description BLOOD RIGHT ARM  Final    Special Requests AEROBIC BOTTLE ONLY Blood Culture adequate volume  Final   Culture   Final    NO GROWTH 5 DAYS Performed at [facility], [address], [place], [place] [address]    Report Status 11/12/2022 FINAL  Final  Culture, blood (single) w Reflex to ID Panel     Status: None   Collection Time: 11/07/22  9:36 AM   Specimen: BLOOD  Result Value Ref Range Status   Specimen Description BLOOD SITE NOT SPECIFIED  Final   Special Requests AEROBIC BOTTLE ONLY Blood Culture adequate volume  Final   Culture   Final    NO GROWTH 5 DAYS Performed at [facility], [address] [place], [place] [address]    Report Status 11/12/2022 FINAL  Final     Serology:   Imaging: If present, new imagings (plain films, ct scans, and mri) have been personally visualized and interpreted; radiology reports have been reviewed. Decision making incorporated into the Impression / Recommendations.   Raymondo Band, MD [facility] [phone] pager    11/13/2022, 10:27 AM

## 2022-11-13 NOTE — Progress Notes (Signed)
Physical Therapy Treatment Patient Details Name: [name] MRN: [id number] DOB: [birth year] Today's Date: 11/13/2022   History of Present Illness 57 y.o. female who presents to the ED from PT appointment due to 1 day duration of left lower extremity pain erythema warmth and edema. PMH: severe morbid obesity, lymphedema, antiphospholipid syndrome, history of pulmonary embolism on Eliquis, rheumatoid arthritis,    PT Comments  Pt sitting up in recliner on entry, agreeable to working with therapy. Pt reports apprehension about discharge home given her low grade fever and ongoing infection. Pt reports infectious disease physician stated that her infection could be treated with P.O. medication. Physician also instructed pt in elevating LE above her heart. Pt requires assist for donning her shoes but afterward is supervision for transfers, ambulation in hallway with Rollator and getting back to bed. At end of session positioned bed so that LE elevated above her heart using bed functions. PT will continue to follow.    If plan is discharge home, recommend the following: Assistance with cooking/housework;Assist for transportation     Equipment Recommendations  BSC/3in1 (bariatric)       Precautions / Restrictions Precautions Precautions: None Restrictions Weight Bearing Restrictions: No     Mobility  Bed Mobility               General bed mobility comments: Pt up in recliner start and end of session    Transfers Overall transfer level: Modified independent Equipment used: Rollator (4 wheels)               General transfer comment: increased effort but able to come to stand from chair to rollator without assistance or LOB    Ambulation/Gait Ambulation/Gait assistance: Supervision Gait Distance (Feet): 150 Feet Assistive device: Rollator (4 wheels) Gait Pattern/deviations: Step-through pattern, Shuffle, Trunk flexed, Decreased dorsiflexion - left, Antalgic Gait  velocity: slowed Gait velocity interpretation: <1.8 ft/sec, indicate of risk for recurrent falls   General Gait Details: despite increased L LE discomfort pt is able to ambulate in hallway with slowed, antalgic gait, with increased flexion and decreased L foot clearance          Balance Overall balance assessment: Mild deficits observed, not formally tested                                          Cognition Arousal: Alert Behavior During Therapy: WFL for tasks assessed/performed Overall Cognitive Status: Within Functional Limits for tasks assessed                                             General Comments General comments (skin integrity, edema, etc.): pt requires assistance with donning shoes prior to ambulation, at end of session elevated pt LE and placed her in [place] to achieve LE above her heart as suggested by infectious disease physican      Pertinent Vitals/Pain Pain Assessment Pain Assessment: Faces Faces Pain Scale: Hurts whole lot Pain Location: L LE Pain Descriptors / Indicators: Burning, Discomfort, Grimacing, Guarding, Throbbing Pain Intervention(s): Limited activity within patient's tolerance, Monitored during session, Repositioned     PT Goals (current goals can now be found in the care plan section) Acute Rehab PT Goals Patient Stated Goal: get back to the gym PT Goal Formulation: With patient  Time For Goal Achievement: 11/21/22 Potential to Achieve Goals: Good Progress towards PT goals: Progressing toward goals    Frequency    Min 1X/week       AM-PAC PT "6 Clicks" Mobility   Outcome Measure  Help needed turning from your back to your side while in a flat bed without using bedrails?: None Help needed moving from lying on your back to sitting on the side of a flat bed without using bedrails?: None Help needed moving to and from a bed to a chair (including a wheelchair)?: None Help needed standing up  from a chair using your arms (e.g., wheelchair or bedside chair)?: None Help needed to walk in hospital room?: A Little Help needed climbing 3-5 steps with a railing? : A Little 6 Click Score: 22    End of Session   Activity Tolerance: Patient limited by pain Patient left: in bed;with bed alarm set (no chair alarm on upon arrival) Nurse Communication: Patient requests pain meds PT Visit Diagnosis: Muscle weakness (generalized) (M62.81);Pain Pain - Right/Left: Left Pain - part of body: Leg     Time: 4098-1191 PT Time Calculation (min) (ACUTE ONLY): 33 min  Charges:    $Gait Training: 8-22 mins $Therapeutic Activity: 8-22 mins PT General Charges $$ ACUTE PT VISIT: 1 Visit                     [name] PT, DPT Acute Rehabilitation Services Please use secure chat or  Call Office [phone]    Elon Alas [facility] 11/13/2022, 1:41 PM

## 2022-11-13 NOTE — Progress Notes (Signed)
[facility] PROGRESS NOTE   [name] KZS:[id number] DOB: 02-13-1966 DOA: 11/06/2022  PCP: Claiborne Rigg, NP  Brief History/Interval Summary: [phone] with h/o morbid obesity with lymphedema, antiphospholipid syndrome, PE on Eliquis, and RA who presented on 9/9 with LLE pain and erythema, diagnosed with sepsis due to cellulitis. She was started on Rocephin and Vancomycin.   Consultants: None  Procedures: None    Subjective/Interval History: Patient does not report any new changes to her left leg.  Still about the same as yesterday.  Somewhat taken aback by her low-grade fever this morning.     Assessment/Plan:  Left lower extremity cellulitis/sepsis, present on admission  At admission SIRS criteria in this patient included: Leukocytosis, tachycardia  Patient has evidence of acute organ failure with recurrent hypotension (SBP < 90 or MAP < 65 x 2 readings) that is not easily explained by another condition. Patient with h/o severe lymphedema of BLE Patient noted to have erythema to the left lower extremity.  Presented with elevated WBC. Patient was given vancomycin and ceftriaxone in the emergency department.  Continued on the same.  Initially started improving but then her symptoms worsened.  MRI was done which did not reveal any abscess.   Seen by infectious disease.  Changed over to cefazolin.   Left lower extremity remains erythematous.  Low-grade fever noted this morning.  ID to reevaluate today.  Will recheck labs tomorrow. Her lymphedema makes treating her cellulitis quite challenging. Her left leg is always chronically larger compared to the right.  Doppler studies are negative for DVT. Blood cultures have been negative.  AKI, prerenal secondary to dehydration Baseline creatinine 0.9 with GFR of greater than 60. Noted to have worsening renal failure with rising creatinine to 1.66.   Improved with IV hydration.    Lymphedema Significant baseline lymphedema  with ambulatory dysfunction.  Her left leg is chronically larger than right.   Murmur/small pericardial effusion Echocardiogram was done which does not show any significant valvular abnormality.  Small pericardial effusion was noted.  This was seen previously as well.  Thyroid function tests were normal.  Essential hypertension Antihypertensives currently on hold due to borderline pressures.  Occasional high readings noted.  Normocytic anemia Drop in hemoglobin likely dilutional.  No evidence for overt bleeding.  Hemoglobin has been stable.   History of pulmonary embolism on Eliquis Continue Eliquis.     Rheumatoid arthritis Hold methotrexate, Cimzia   GERD Resume famotidine   Physical debility PT OT assessment Fall precautions.   Isolated elevated T. Bili T. bili 3.5 on presentation.  Possibly due to sepsis.  Improvement noted.  Morbid obesity Estimated body mass index is 49.62 kg/m as calculated from the following:   Height as of this encounter: 5\' 9"  (1.753 m).   Weight as of this encounter: 152.4 kg.  DVT Prophylaxis: On Eliquis Code Status: Full code Family Communication: Discussed with patient Disposition Plan: Anticipate discharge home when improved.     Medications: Scheduled:  apixaban  5 mg Oral BID   ezetimibe  10 mg Oral Daily   famotidine  40 mg Oral Daily   folic acid  1 mg Oral Daily   Vitamin D (Ergocalciferol)  50,000 Units Oral Q Sat   Continuous:   ceFAZolin (ANCEF) IV 2 g (11/13/22 0507)   GUR:[id number], albuterol, melatonin, midodrine, oxyCODONE, polyethylene glycol, prochlorperazine  Antibiotics: Anti-infectives (From admission, onward)    Start     Dose/Rate Route Frequency Ordered Stop   11/10/22 1400  ceFAZolin (ANCEF) IVPB 2g/100 mL premix        2 g 200 mL/hr over 30 Minutes Intravenous Every 8 hours 11/10/22 1256     11/07/22 2200  vancomycin (VANCOREADY) IVPB 1250 mg/250 mL  Status:  Discontinued        1,250 mg 166.7 mL/hr  over 90 Minutes Intravenous Every 12 hours 11/07/22 0552 11/10/22 1256   11/07/22 0645  vancomycin (VANCOREADY) IVPB 2000 mg/400 mL        2,000 mg 200 mL/hr over 120 Minutes Intravenous  Once 11/07/22 0552 11/07/22 1036   11/07/22 0600  cefTRIAXone (ROCEPHIN) 2 g in sodium chloride 0.9 % 100 mL IVPB  Status:  Discontinued        2 g 200 mL/hr over 30 Minutes Intravenous Daily 11/07/22 0542 11/10/22 1256   11/06/22 2330  clindamycin (CLEOCIN) IVPB 600 mg        600 mg 100 mL/hr over 30 Minutes Intravenous  Once 11/06/22 2322 11/07/22 0128       Objective:  Vital Signs  Vitals:   11/12/22 1723 11/12/22 2104 11/13/22 0504 11/13/22 0749  BP: (!) 143/80 (!) 116/57 (!) 125/57 120/63  Pulse: 95 98 97 95  Resp: 18 18  18   Temp: 98 F (36.7 C) 98.9 F (37.2 C) 99.8 F (37.7 C) (!) 100.4 F (38 C)  TempSrc:  Oral Oral Oral  SpO2: 100% 98% 97% 98%  Weight:      Height:        Intake/Output Summary (Last 24 hours) at 11/13/2022 0951 Last data filed at 11/12/2022 1800 Gross per 24 hour  Intake 580 ml  Output --  Net 580 ml    Filed Weights   11/06/22 1404  Weight: (!) 152.4 kg    General appearance: Awake alert.  In no distress Resp: Clear to auscultation bilaterally.  Normal effort Cardio: S1-S2 is normal regular.  No S3-S4.  No rubs murmurs or bruit GI: Abdomen is soft.  Nontender nondistended.  Bowel sounds are present normal.  No masses organomegaly Extremities: Left lower extremity swelling is noted as before.  Erythema persist.  Warm to touch.  Nontender.   Lab Results:  Data Reviewed: I have personally reviewed following labs and reports of the imaging studies  CBC: Recent Labs  Lab 11/06/22 1436 11/07/22 0816 11/08/22 0350 11/09/22 0627 11/10/22 0321 11/12/22 0350  WBC 16.7* 16.2* 10.2 8.1 11.5* 10.8*  NEUTROABS 13.5*  --  6.7  --   --   --   HGB 13.0 9.7* 9.5* 9.6* 9.4* 9.0*  HCT 41.5 30.6* 30.0* 30.9* 30.1* 29.5*  MCV 93.5 92.2 94.3 93.9 94.1 95.2   PLT 225 167 175 195 204 244    Basic Metabolic Panel: Recent Labs  Lab 11/07/22 0816 11/08/22 0350 11/09/22 0627 11/10/22 0321 11/12/22 0350  NA 134* 134* 134* 136 134*  K 3.5 4.1 4.1 4.1 3.7  CL 103 106 104 103 104  CO2 24 21* 22 24 25   GLUCOSE 133* 120* 114* 117* 109*  BUN 26* 21* 10 12 8   CREATININE 1.66* 1.11* 0.87 0.94 0.91  CALCIUM 8.2* 8.0* 8.4* 8.5* 8.6*  MG 2.2  --   --   --   --   PHOS 2.6  --   --   --   --     GFR: Estimated Creatinine Clearance: 112.3 mL/min (by C-G formula based on SCr of 0.91 mg/dL).  Liver Function Tests: Recent Labs  Lab 11/06/22 1436 11/07/22 4098  11/08/22 0350 11/10/22 0321  AST 13* 11* 17 15  ALT 21 17 21 20   ALKPHOS 52 36* 37* 36*  BILITOT 3.5* 2.3* 1.5* 1.6*  PROT 8.0 6.0* 5.8* 6.0*  ALBUMIN 3.9 2.6* 2.6* 2.5*     Recent Results (from the past 240 hour(s))  Resp panel by RT-PCR (RSV, Flu A&B, Covid) Anterior Nasal Swab     Status: None   Collection Time: 11/06/22  2:36 PM   Specimen: Anterior Nasal Swab  Result Value Ref Range Status   SARS Coronavirus 2 by RT PCR NEGATIVE NEGATIVE Final    Comment: (NOTE) SARS-CoV-2 target nucleic acids are NOT DETECTED.  The SARS-CoV-2 RNA is generally detectable in upper respiratory specimens during the acute phase of infection. The lowest concentration of SARS-CoV-2 viral copies this assay can detect is 138 copies/mL. A negative result does not preclude SARS-Cov-2 infection and should not be used as the sole basis for treatment or other patient management decisions. A negative result may occur with  improper specimen collection/handling, submission of specimen other than nasopharyngeal swab, presence of viral mutation(s) within the areas targeted by this assay, and inadequate number of viral copies(<138 copies/mL). A negative result must be combined with clinical observations, patient history, and epidemiological information. The expected result is Negative.  Fact Sheet for  Patients:  BloggerCourse.com  Fact Sheet for Healthcare Providers:  SeriousBroker.it  This test is no t yet approved or cleared by the [place] FDA and  has been authorized for detection and/or diagnosis of SARS-CoV-2 by FDA under an Emergency Use Authorization (EUA). This EUA will remain  in effect (meaning this test can be used) for the duration of the COVID-19 declaration under Section 564(b)(1) of the Act, 21 U.S.C.section 360bbb-3(b)(1), unless the authorization is terminated  or revoked sooner.       Influenza A by PCR NEGATIVE NEGATIVE Final   Influenza B by PCR NEGATIVE NEGATIVE Final    Comment: (NOTE) The Xpert Xpress SARS-CoV-2/FLU/RSV plus assay is intended as an aid in the diagnosis of influenza from Nasopharyngeal swab specimens and should not be used as a sole basis for treatment. Nasal washings and aspirates are unacceptable for Xpert Xpress SARS-CoV-2/FLU/RSV testing.  Fact Sheet for Patients: BloggerCourse.com  Fact Sheet for Healthcare Providers: SeriousBroker.it  This test is not yet approved or cleared by the [place] FDA and has been authorized for detection and/or diagnosis of SARS-CoV-2 by FDA under an Emergency Use Authorization (EUA). This EUA will remain in effect (meaning this test can be used) for the duration of the COVID-19 declaration under Section 564(b)(1) of the Act, 21 U.S.C. section 360bbb-3(b)(1), unless the authorization is terminated or revoked.     Resp Syncytial Virus by PCR NEGATIVE NEGATIVE Final    Comment: (NOTE) Fact Sheet for Patients: BloggerCourse.com  Fact Sheet for Healthcare Providers: SeriousBroker.it  This test is not yet approved or cleared by the [place] FDA and has been authorized for detection and/or diagnosis of SARS-CoV-2 by FDA under an Emergency  Use Authorization (EUA). This EUA will remain in effect (meaning this test can be used) for the duration of the COVID-19 declaration under Section 564(b)(1) of the Act, 21 U.S.C. section 360bbb-3(b)(1), unless the authorization is terminated or revoked.  Performed at [facility], [address] [place], [place] [address]   Culture, blood (routine x 2)     Status: None   Collection Time: 11/06/22  9:01 PM   Specimen: BLOOD  Result Value Ref  Range Status   Specimen Description   Final    BLOOD LEFT ANTECUBITAL Performed at [facility], [address] [place], [place] [address]    Special Requests   Final    BOTTLES DRAWN AEROBIC AND ANAEROBIC Blood Culture adequate volume Performed at [facility], [address], [place], [place] [address]    Culture   Final    NO GROWTH 5 DAYS Performed at [facility], [address] [place], [place] [address]    Report Status 11/11/2022 FINAL  Final  Culture, blood (routine x 2)     Status: None   Collection Time: 11/07/22  8:15 AM   Specimen: BLOOD RIGHT ARM  Result Value Ref Range Status   Specimen Description BLOOD RIGHT ARM  Final   Special Requests AEROBIC BOTTLE ONLY Blood Culture adequate volume  Final   Culture   Final    NO GROWTH 5 DAYS Performed at [facility], [address], [place], [place] [address]    Report Status 11/12/2022 FINAL  Final  Culture, blood (single) w Reflex to ID Panel     Status: None   Collection Time: 11/07/22  9:36 AM   Specimen: BLOOD  Result Value Ref Range Status   Specimen Description BLOOD SITE NOT SPECIFIED  Final   Special Requests AEROBIC BOTTLE ONLY Blood Culture adequate volume  Final   Culture   Final    NO GROWTH 5 DAYS Performed at [facility], [address], [place], [place] [address]    Report Status 11/12/2022 FINAL  Final      Radiology Studies: No results found.     LOS: 6 days   Halbert Jesson  [employer] on [email or web address]  11/13/2022, 9:51 AM

## 2022-11-14 ENCOUNTER — Encounter: Payer: Medicare HMO | Admitting: Occupational Therapy

## 2022-11-14 DIAGNOSIS — L03116 Cellulitis of left lower limb: Secondary | ICD-10-CM | POA: Diagnosis not present

## 2022-11-14 DIAGNOSIS — I10 Essential (primary) hypertension: Secondary | ICD-10-CM | POA: Diagnosis not present

## 2022-11-14 LAB — BASIC METABOLIC PANEL
Anion gap: 5 (ref 5–15)
BUN: 6 mg/dL (ref 6–20)
CO2: 27 mmol/L (ref 22–32)
Calcium: 8.4 mg/dL — ABNORMAL LOW (ref 8.9–10.3)
Chloride: 103 mmol/L (ref 98–111)
Creatinine, Ser: 0.82 mg/dL (ref 0.44–1.00)
GFR, Estimated: >60 mL/min (ref >60)
Glucose, Bld: 106 mg/dL — ABNORMAL HIGH (ref 70–99)
Potassium: 3.7 mmol/L (ref 3.5–5.1)
Sodium: 135 mmol/L (ref 135–145)

## 2022-11-14 LAB — CBC
HCT: 29 % — ABNORMAL LOW (ref 36.0–46.0)
Hemoglobin: 9 g/dL — ABNORMAL LOW (ref 12.0–15.0)
MCH: 29.1 pg (ref 26.0–34.0)
MCHC: 31 g/dL (ref 30.0–36.0)
MCV: 93.9 fL (ref 80.0–100.0)
Platelets: 319 10*3/uL (ref 150–400)
RBC: 3.09 MIL/uL — ABNORMAL LOW (ref 3.87–5.11)
RDW: 16.4 % — ABNORMAL HIGH (ref 11.5–15.5)
WBC: 9 10*3/uL (ref 4.0–10.5)
nRBC: 0 % (ref 0.0–0.2)

## 2022-11-14 NOTE — Care Management Important Message (Signed)
Important Message  Patient Details  Name: [name] MRN: [id number] Date of Birth: 10/27/1965   Medicare Important Message Given:  Yes     Sherilyn Banker 11/14/2022, 12:34 PM

## 2022-11-14 NOTE — Progress Notes (Signed)
Mobility Specialist: Progress Note   11/14/22 1641  Mobility  Activity Ambulated with assistance in hallway  Level of Assistance Standby assist, set-up cues, supervision of patient - no hands on  Assistive Device Four wheel walker  Distance Ambulated (ft) 70 ft  Activity Response Tolerated well  Mobility Referral Yes  $Mobility charge 1 Mobility  Mobility Specialist Start Time (ACUTE ONLY) 1108  Mobility Specialist Stop Time (ACUTE ONLY) 1123  Mobility Specialist Time Calculation (min) (ACUTE ONLY) 15 min    Pt was agreeable to mobility session - received in chair. ModI for bed mobility and transfer, but SB for ambulation today d/t dizziness and LLE pain rated 8/10. Still not feeling her best d/t aftermath of recent fever and also frustrated since she isn't able to go home yet. Returned to room without fault. Left in chair with all needs met, call bell in reach.   Maurene Capes Mobility Specialist Please contact via SecureChat or Rehab office at [phone]

## 2022-11-14 NOTE — Plan of Care (Signed)

## 2022-11-14 NOTE — Progress Notes (Signed)
[facility] PROGRESS NOTE   [name] YTK:[id number] DOB: 07/27/1965 DOA: 11/06/2022  PCP: Claiborne Rigg, NP  Brief History/Interval Summary: [phone] with h/o morbid obesity with lymphedema, antiphospholipid syndrome, PE on Eliquis, and RA who presented on 9/9 with LLE pain and erythema, diagnosed with sepsis due to cellulitis. She was started on Rocephin and Vancomycin.   Consultants: None  Procedures: None    Subjective/Interval History: Patient mentions that the leg is starting to feel better.  However she did have a fever overnight.  Assessment/Plan:  Left lower extremity cellulitis/sepsis, present on admission  At admission SIRS criteria in this patient included: Leukocytosis, tachycardia  Patient has evidence of acute organ failure with recurrent hypotension (SBP < 90 or MAP < 65 x 2 readings) that is not easily explained by another condition. Patient with h/o severe lymphedema of BLE Patient noted to have erythema to the left lower extremity.  Presented with elevated WBC. Patient was given vancomycin and ceftriaxone in the emergency department.  Continued on the same.  Initially started improving but then her symptoms worsened.  MRI was done which did not reveal any abscess.   Seen by infectious disease.  Changed over to cefazolin.  Subsequently changed over to cefadroxil.  Patient spiked a fever 202 F yesterday.  Feels better this morning.  Erythema is about the same.  Will leave her on oral antibiotics for now and see how she does over the next 24 hours.  If she starts spiking fever again will need to [facility] ID. Reassuringly her WBC is normal. Her lymphedema makes treating her cellulitis quite challenging. Her left leg is always chronically larger compared to the right.  Doppler studies are negative for DVT. Blood cultures have been negative.  AKI, prerenal secondary to dehydration Baseline creatinine 0.9 with GFR of greater than 60. Noted to have  worsening renal failure with rising creatinine to 1.66.   Improved with IV hydration.    Lymphedema Significant baseline lymphedema with ambulatory dysfunction.  Her left leg is chronically larger than right.   Murmur/small pericardial effusion Echocardiogram was done which does not show any significant valvular abnormality.  Small pericardial effusion was noted.  This was seen previously as well.  Thyroid function tests were normal.  Essential hypertension Antihypertensives currently on hold due to borderline pressures.  Occasional high readings noted.  Normocytic anemia Drop in hemoglobin likely dilutional.  No evidence for overt bleeding.  Hemoglobin has been stable.   History of pulmonary embolism on Eliquis Continue Eliquis.     Rheumatoid arthritis Hold methotrexate, Cimzia   GERD Resume famotidine   Physical debility PT OT assessment Fall precautions.   Isolated elevated T. Bili T. bili 3.5 on presentation.  Possibly due to sepsis.  Improvement noted.  Morbid obesity Estimated body mass index is 49.62 kg/m as calculated from the following:   Height as of this encounter: 5\' 9"  (1.753 m).   Weight as of this encounter: 152.4 kg.  DVT Prophylaxis: On Eliquis Code Status: Full code Family Communication: Discussed with patient Disposition Plan: Anticipate discharge home when improved.     Medications: Scheduled:  apixaban  5 mg Oral BID   cefadroxil  1,000 mg Oral BID   ezetimibe  10 mg Oral Daily   famotidine  40 mg Oral Daily   folic acid  1 mg Oral Daily   Vitamin D (Ergocalciferol)  50,000 Units Oral Q Sat   Continuous:   UKG:[id number], albuterol, melatonin, midodrine, oxyCODONE, polyethylene glycol, prochlorperazine  Antibiotics: Anti-infectives (From admission, onward)    Start     Dose/Rate Route Frequency Ordered Stop   11/13/22 1130  cefadroxil (DURICEF) capsule 1,000 mg        1,000 mg Oral 2 times daily 11/13/22 1038 12/04/22 0959    11/13/22 0000  cefadroxil (DURICEF) 500 MG capsule        1,000 mg Oral 2 times daily 11/13/22 1028 12/04/22 2359   11/10/22 1400  ceFAZolin (ANCEF) IVPB 2g/100 mL premix  Status:  Discontinued        2 g 200 mL/hr over 30 Minutes Intravenous Every 8 hours 11/10/22 1256 11/13/22 1038   11/07/22 2200  vancomycin (VANCOREADY) IVPB 1250 mg/250 mL  Status:  Discontinued        1,250 mg 166.7 mL/hr over 90 Minutes Intravenous Every 12 hours 11/07/22 0552 11/10/22 1256   11/07/22 0645  vancomycin (VANCOREADY) IVPB 2000 mg/400 mL        2,000 mg 200 mL/hr over 120 Minutes Intravenous  Once 11/07/22 0552 11/07/22 1036   11/07/22 0600  cefTRIAXone (ROCEPHIN) 2 g in sodium chloride 0.9 % 100 mL IVPB  Status:  Discontinued        2 g 200 mL/hr over 30 Minutes Intravenous Daily 11/07/22 0542 11/10/22 1256   11/06/22 2330  clindamycin (CLEOCIN) IVPB 600 mg        600 mg 100 mL/hr over 30 Minutes Intravenous  Once 11/06/22 2322 11/07/22 0128       Objective:  Vital Signs  Vitals:   11/13/22 1358 11/13/22 2015 11/14/22 0356 11/14/22 0736  BP: (!) 165/76 (!) 165/57 122/61 (!) 143/80  Pulse: 99 (!) 104 90 86  Resp: 18 18 18 18   Temp: 99 F (37.2 C) (!) 102.9 F (39.4 C) 98.4 F (36.9 C) 98.7 F (37.1 C)  TempSrc: Oral Oral Oral Oral  SpO2: 100% 99% 100% 100%  Weight:      Height:       No intake or output data in the 24 hours ending 11/14/22 1000   Filed Weights   11/06/22 1404  Weight: (!) 152.4 kg    General appearance: Awake alert.  In no distress Resp: Clear to auscultation bilaterally.  Normal effort Cardio: S1-S2 is normal regular.  No S3-S4.  No rubs murmurs or bruit GI: Abdomen is soft.  Nontender nondistended.  Bowel sounds are present normal.  No masses organomegaly Extremities: Left lower extremity swelling is as before.  Erythema seems to be slightly less today compared to yesterday.  Lab Results:  Data Reviewed: I have personally reviewed following labs and reports  of the imaging studies  CBC: Recent Labs  Lab 11/08/22 0350 11/09/22 0627 11/10/22 0321 11/12/22 0350 11/14/22 0417  WBC 10.2 8.1 11.5* 10.8* 9.0  NEUTROABS 6.7  --   --   --   --   HGB 9.5* 9.6* 9.4* 9.0* 9.0*  HCT 30.0* 30.9* 30.1* 29.5* 29.0*  MCV 94.3 93.9 94.1 95.2 93.9  PLT 175 195 204 244 319    Basic Metabolic Panel: Recent Labs  Lab 11/08/22 0350 11/09/22 0627 11/10/22 0321 11/12/22 0350 11/14/22 0417  NA 134* 134* 136 134* 135  K 4.1 4.1 4.1 3.7 3.7  CL 106 104 103 104 103  CO2 21* 22 24 25 27   GLUCOSE 120* 114* 117* 109* 106*  BUN 21* 10 12 8 6   CREATININE 1.11* 0.87 0.94 0.91 0.82  CALCIUM 8.0* 8.4* 8.5* 8.6* 8.4*    GFR: Estimated  Creatinine Clearance: 124.7 mL/min (by C-G formula based on SCr of 0.82 mg/dL).  Liver Function Tests: Recent Labs  Lab 11/08/22 0350 11/10/22 0321  AST 17 15  ALT 21 20  ALKPHOS 37* 36*  BILITOT 1.5* 1.6*  PROT 5.8* 6.0*  ALBUMIN 2.6* 2.5*     Recent Results (from the past 240 hour(s))  Resp panel by RT-PCR (RSV, Flu A&B, Covid) Anterior Nasal Swab     Status: None   Collection Time: 11/06/22  2:36 PM   Specimen: Anterior Nasal Swab  Result Value Ref Range Status   SARS Coronavirus 2 by RT PCR NEGATIVE NEGATIVE Final    Comment: (NOTE) SARS-CoV-2 target nucleic acids are NOT DETECTED.  The SARS-CoV-2 RNA is generally detectable in upper respiratory specimens during the acute phase of infection. The lowest concentration of SARS-CoV-2 viral copies this assay can detect is 138 copies/mL. A negative result does not preclude SARS-Cov-2 infection and should not be used as the sole basis for treatment or other patient management decisions. A negative result may occur with  improper specimen collection/handling, submission of specimen other than nasopharyngeal swab, presence of viral mutation(s) within the areas targeted by this assay, and inadequate number of viral copies(<138 copies/mL). A negative result must  be combined with clinical observations, patient history, and epidemiological information. The expected result is Negative.  Fact Sheet for Patients:  BloggerCourse.com  Fact Sheet for Healthcare Providers:  SeriousBroker.it  This test is no t yet approved or cleared by the [place] FDA and  has been authorized for detection and/or diagnosis of SARS-CoV-2 by FDA under an Emergency Use Authorization (EUA). This EUA will remain  in effect (meaning this test can be used) for the duration of the COVID-19 declaration under Section 564(b)(1) of the Act, 21 U.S.C.section 360bbb-3(b)(1), unless the authorization is terminated  or revoked sooner.       Influenza A by PCR NEGATIVE NEGATIVE Final   Influenza B by PCR NEGATIVE NEGATIVE Final    Comment: (NOTE) The Xpert Xpress SARS-CoV-2/FLU/RSV plus assay is intended as an aid in the diagnosis of influenza from Nasopharyngeal swab specimens and should not be used as a sole basis for treatment. Nasal washings and aspirates are unacceptable for Xpert Xpress SARS-CoV-2/FLU/RSV testing.  Fact Sheet for Patients: BloggerCourse.com  Fact Sheet for Healthcare Providers: SeriousBroker.it  This test is not yet approved or cleared by the [place] FDA and has been authorized for detection and/or diagnosis of SARS-CoV-2 by FDA under an Emergency Use Authorization (EUA). This EUA will remain in effect (meaning this test can be used) for the duration of the COVID-19 declaration under Section 564(b)(1) of the Act, 21 U.S.C. section 360bbb-3(b)(1), unless the authorization is terminated or revoked.     Resp Syncytial Virus by PCR NEGATIVE NEGATIVE Final    Comment: (NOTE) Fact Sheet for Patients: BloggerCourse.com  Fact Sheet for Healthcare Providers: SeriousBroker.it  This test is not  yet approved or cleared by the [place] FDA and has been authorized for detection and/or diagnosis of SARS-CoV-2 by FDA under an Emergency Use Authorization (EUA). This EUA will remain in effect (meaning this test can be used) for the duration of the COVID-19 declaration under Section 564(b)(1) of the Act, 21 U.S.C. section 360bbb-3(b)(1), unless the authorization is terminated or revoked.  Performed at [facility], [address] [place], [place] [address]   Culture, blood (routine x 2)     Status: None   Collection Time: 11/06/22  9:01  PM   Specimen: BLOOD  Result Value Ref Range Status   Specimen Description   Final    BLOOD LEFT ANTECUBITAL Performed at [facility], [address] [place], [place] [address]    Special Requests   Final    BOTTLES DRAWN AEROBIC AND ANAEROBIC Blood Culture adequate volume Performed at [facility], [address] [place], [place] [address]    Culture   Final    NO GROWTH 5 DAYS Performed at [facility], [address] [place], [place] [address]    Report Status 11/11/2022 FINAL  Final  Culture, blood (routine x 2)     Status: None   Collection Time: 11/07/22  8:15 AM   Specimen: BLOOD RIGHT ARM  Result Value Ref Range Status   Specimen Description BLOOD RIGHT ARM  Final   Special Requests AEROBIC BOTTLE ONLY Blood Culture adequate volume  Final   Culture   Final    NO GROWTH 5 DAYS Performed at [facility], [address], [place], [place] [address]    Report Status 11/12/2022 FINAL  Final  Culture, blood (single) w Reflex to ID Panel     Status: None   Collection Time: 11/07/22  9:36 AM   Specimen: BLOOD  Result Value Ref Range Status   Specimen Description BLOOD SITE NOT SPECIFIED  Final   Special Requests AEROBIC BOTTLE ONLY Blood Culture adequate volume  Final   Culture   Final    NO GROWTH 5 DAYS Performed at [facility], [address],  [place], [place] [address]    Report Status 11/12/2022 FINAL  Final      Radiology Studies: No results found.     LOS: 7 days   Donneisha Beane [employer] on [email or web address]  11/14/2022, 10:00 AM

## 2022-11-15 ENCOUNTER — Ambulatory Visit: Payer: Self-pay

## 2022-11-15 ENCOUNTER — Encounter: Payer: Medicare HMO | Admitting: Occupational Therapy

## 2022-11-15 DIAGNOSIS — I890 Lymphedema, not elsewhere classified: Secondary | ICD-10-CM | POA: Diagnosis not present

## 2022-11-15 DIAGNOSIS — A419 Sepsis, unspecified organism: Secondary | ICD-10-CM | POA: Diagnosis not present

## 2022-11-15 DIAGNOSIS — L0390 Cellulitis, unspecified: Secondary | ICD-10-CM | POA: Diagnosis not present

## 2022-11-15 NOTE — Progress Notes (Signed)
Physical Therapy Treatment Patient Details Name: [name] MRN: [id number] DOB: 01-Oct-1965 Today's Date: 11/15/2022   History of Present Illness 57 y.o. female who presents to the ED from PT appointment due to 1 day duration of left lower extremity pain erythema warmth and edema. PMH: severe morbid obesity, lymphedema, antiphospholipid syndrome, history of pulmonary embolism on Eliquis, rheumatoid arthritis,    PT Comments  Pt dressed, sitting up in recliner with feet down, reports disappointment at not being able to go home today. Reports pain in her LE joints since swelling in LE has decreased. Agreeable to getting back to bed to elevate LE. Able to independently take off clothing and don gown and then walk to bed with Rollator at mod I level. Pt requires min A for bringing L LE back into bed. Pt set up for lunch. D/c plans remain appropriate at this time.     If plan is discharge home, recommend the following: Assistance with cooking/housework;Assist for transportation     Equipment Recommendations  BSC/3in1 (bariatric)       Precautions / Restrictions Precautions Precautions: None Restrictions Weight Bearing Restrictions: No     Mobility  Bed Mobility Overal bed mobility: Needs Assistance Bed Mobility: Sit to Supine       Sit to supine: Min assist   General bed mobility comments: requires minA for bringing L LE back into bed    Transfers Overall transfer level: Modified independent Equipment used: Rollator (4 wheels)               General transfer comment: increased effort but able to come to stand from chair to rollator without assistance or LOB    Ambulation/Gait Ambulation/Gait assistance: Supervision, Modified independent (Device/Increase time) Gait Distance (Feet): 10 Feet Assistive device: Rollator (4 wheels) Gait Pattern/deviations: Step-through pattern, Shuffle, Trunk flexed, Decreased dorsiflexion - left, Antalgic Gait velocity: slowed Gait  velocity interpretation: <1.8 ft/sec, indicate of risk for recurrent falls   General Gait Details: pt with slightly antalgic gait to get back to bed, vc for upright posture      Balance Overall balance assessment: Mild deficits observed, not formally tested                                          Cognition Arousal: Alert Behavior During Therapy: WFL for tasks assessed/performed Overall Cognitive Status: Within Functional Limits for tasks assessed                                             General Comments General comments (skin integrity, edema, etc.): VSS on RA      Pertinent Vitals/Pain Pain Assessment Pain Assessment: Faces Faces Pain Scale: Hurts little more Pain Location: LE joint pain Pain Descriptors / Indicators: Burning, Discomfort, Grimacing, Guarding, Throbbing Pain Intervention(s): Limited activity within patient's tolerance, Monitored during session, Repositioned     PT Goals (current goals can now be found in the care plan section) Acute Rehab PT Goals Patient Stated Goal: get back to the gym PT Goal Formulation: With patient Time For Goal Achievement: 11/21/22 Potential to Achieve Goals: Good Progress towards PT goals: Progressing toward goals    Frequency    Min 1X/week       AM-PAC PT "6 Clicks" Mobility   Outcome Measure  Help needed turning from your back to your side while in a flat bed without using bedrails?: None Help needed moving from lying on your back to sitting on the side of a flat bed without using bedrails?: None Help needed moving to and from a bed to a chair (including a wheelchair)?: None Help needed standing up from a chair using your arms (e.g., wheelchair or bedside chair)?: None Help needed to walk in hospital room?: A Little Help needed climbing 3-5 steps with a railing? : A Little 6 Click Score: 22    End of Session   Activity Tolerance: Patient limited by pain Patient left: in  bed;with bed alarm set (no chair alarm on upon arrival) Nurse Communication: Patient requests pain meds PT Visit Diagnosis: Muscle weakness (generalized) (M62.81);Pain Pain - Right/Left: Left Pain - part of body: Leg     Time: 4098-1191 PT Time Calculation (min) (ACUTE ONLY): 14 min  Charges:    $Therapeutic Activity: 8-22 mins PT General Charges $$ ACUTE PT VISIT: 1 Visit                     Jordy Verba B. Beverely Risen PT, DPT Acute Rehabilitation Services Please use secure chat or  Call Office [phone]    [facility] 11/15/2022, 4:22 PM

## 2022-11-15 NOTE — Progress Notes (Signed)
[facility]  Date of Admission:  11/06/2022     Abx: Cefazolin --> cefadroxil on 9/16  Vanc/ceftriaxone  ASSESSMENT: Cellulitis Lymphedema  Improving on iv cefazolin -> cefadroxil  ------ 9/18 assessment Isolated fever but otherwise clinically much improved On anticoagulation for hx dvt and at admission negative duplex LLE so doubt thrombotic related fever  No other localizing sx of infectious disease otherwise  Ok to discharge still with long abx course    PLAN: Continue cefadroxil; 3 week plan as mentioned before F/u outpatient wound center for unna boot Ok to dc from id standpoint Clinic f/u 10/03 @ 4pm with me Id will sign off Discussed with primary team   Principal Problem:   Sepsis due to cellulitis (HCC) Active Problems:   Rheumatoid arthritis (HCC)   History of pulmonary embolus (PE)   Morbid obesity (HCC)   Lymphedema   AKI (acute kidney injury) (HCC)   Murmur, cardiac   Essential hypertension   Elevated bilirubin   Allergies  Allergen Reactions   Abatacept Anaphylaxis    Other reaction(s): Anaphylaxis-Streptomycin Clickject Other reaction(s): Anaphylaxis-Streptomycin   Bee Venom Anaphylaxis   Augmentin [Amoxicillin-Pot Clavulanate] Hives, Itching and Other (See Comments)    Did PCN reaction causing immediate rash, facial/tongue/throat swelling, SOB or lightheadedness with hypotension: yes Did PCN reaction causing severe rash involving mucus membranes or skin necrosis: no Has patient had a PCN reaction that required hospitalization: in hospital Has patient had a PCN reaction occurring within the last 10 years: no If all of the above answers are "NO", then may proceed with Cephalosporin use.  Pt reports no recollection of any reactions when taking penicillin in past   Latex Hives and Other (See Comments)    Local reaction (welts). Patient denies any wheezing or other reaction with latex exposure   Nulytely  [Peg 3350-Kcl-Na Bicarb-Nacl]     NAUSEA AND VOMITING. MAY TOLERATE LOW VOLUME PREP.    Scheduled Meds:  apixaban  5 mg Oral BID   cefadroxil  1,000 mg Oral BID   ezetimibe  10 mg Oral Daily   famotidine  40 mg Oral Daily   folic acid  1 mg Oral Daily   Vitamin D (Ergocalciferol)  50,000 Units Oral Q Sat   Continuous Infusions:   PRN Meds:.acetaminophen, albuterol, melatonin, midodrine, oxyCODONE, polyethylene glycol, prochlorperazine   SUBJECTIVE: No complaint Fever 9/16 but improving curve, and no other hemodynamic disturbance  Review of Systems: ROS All other ROS was negative, except mentioned above     OBJECTIVE: Vitals:   11/14/22 2200 11/15/22 0444 11/15/22 0828 11/15/22 1345  BP: (!) 158/96 116/60 108/60 (!) 122/104  Pulse: 98 98 95 92  Resp: 16 16 17 18   Temp: (!) 101.4 F (38.6 C) 99.5 F (37.5 C) 99.2 F (37.3 C) 98.8 F (37.1 C)  TempSrc: Oral Oral Oral Oral  SpO2: 100% 98% 98% 99%  Weight:      Height:       Body mass index is 49.62 kg/m.  Physical Exam  General/constitutional: no distress, pleasant HEENT: Normocephalic, PER, Conj Clear, EOMI, Oropharynx clear Neck supple CV: rrr no mrg Lungs: clear to auscultation, normal respiratory effort Abd: Soft, Nontender Ext/skin -- erythema resolved lle; tenderness almost resolved; warm still; no open skin/wound; chronic brawny edema bilaterally LLE >> RLE    Lab Results Lab Results  Component Value Date   WBC 10.9 (H) 11/15/2022   HGB 9.3 (L) 11/15/2022  HCT 29.4 (L) 11/15/2022   MCV 93.9 11/15/2022   PLT 321 11/15/2022    Lab Results  Component Value Date   CREATININE 0.82 11/14/2022   BUN 6 11/14/2022   NA 135 11/14/2022   K 3.7 11/14/2022   CL 103 11/14/2022   CO2 27 11/14/2022    Lab Results  Component Value Date   ALT 20 11/10/2022   AST 15 11/10/2022   ALKPHOS 36 (L) 11/10/2022   BILITOT 1.6 (H) 11/10/2022      Microbiology: Recent Results (from the past 240 hour(s))   Resp panel by RT-PCR (RSV, Flu A&B, Covid) Anterior Nasal Swab     Status: None   Collection Time: 11/06/22  2:36 PM   Specimen: Anterior Nasal Swab  Result Value Ref Range Status   SARS Coronavirus 2 by RT PCR NEGATIVE NEGATIVE Final    Comment: (NOTE) SARS-CoV-2 target nucleic acids are NOT DETECTED.  The SARS-CoV-2 RNA is generally detectable in upper respiratory specimens during the acute phase of infection. The lowest concentration of SARS-CoV-2 viral copies this assay can detect is 138 copies/mL. A negative result does not preclude SARS-Cov-2 infection and should not be used as the sole basis for treatment or other patient management decisions. A negative result may occur with  improper specimen collection/handling, submission of specimen other than nasopharyngeal swab, presence of viral mutation(s) within the areas targeted by this assay, and inadequate number of viral copies(<138 copies/mL). A negative result must be combined with clinical observations, patient history, and epidemiological information. The expected result is Negative.  Fact Sheet for Patients:  BloggerCourse.com  Fact Sheet for Healthcare Providers:  SeriousBroker.it  This test is no t yet approved or cleared by the [place] FDA and  has been authorized for detection and/or diagnosis of SARS-CoV-2 by FDA under an Emergency Use Authorization (EUA). This EUA will remain  in effect (meaning this test can be used) for the duration of the COVID-19 declaration under Section 564(b)(1) of the Act, 21 U.S.C.section 360bbb-3(b)(1), unless the authorization is terminated  or revoked sooner.       Influenza A by PCR NEGATIVE NEGATIVE Final   Influenza B by PCR NEGATIVE NEGATIVE Final    Comment: (NOTE) The Xpert Xpress SARS-CoV-2/FLU/RSV plus assay is intended as an aid in the diagnosis of influenza from Nasopharyngeal swab specimens and should not be used  as a sole basis for treatment. Nasal washings and aspirates are unacceptable for Xpert Xpress SARS-CoV-2/FLU/RSV testing.  Fact Sheet for Patients: BloggerCourse.com  Fact Sheet for Healthcare Providers: SeriousBroker.it  This test is not yet approved or cleared by the [place] FDA and has been authorized for detection and/or diagnosis of SARS-CoV-2 by FDA under an Emergency Use Authorization (EUA). This EUA will remain in effect (meaning this test can be used) for the duration of the COVID-19 declaration under Section 564(b)(1) of the Act, 21 U.S.C. section 360bbb-3(b)(1), unless the authorization is terminated or revoked.     Resp Syncytial Virus by PCR NEGATIVE NEGATIVE Final    Comment: (NOTE) Fact Sheet for Patients: BloggerCourse.com  Fact Sheet for Healthcare Providers: SeriousBroker.it  This test is not yet approved or cleared by the [place] FDA and has been authorized for detection and/or diagnosis of SARS-CoV-2 by FDA under an Emergency Use Authorization (EUA). This EUA will remain in effect (meaning this test can be used) for the duration of the COVID-19 declaration under Section 564(b)(1) of the Act, 21 U.S.C. section 360bbb-3(b)(1), unless the authorization is  terminated or revoked.  Performed at [facility], [address] [place], [place] [address]   Culture, blood (routine x 2)     Status: None   Collection Time: 11/06/22  9:01 PM   Specimen: BLOOD  Result Value Ref Range Status   Specimen Description   Final    BLOOD LEFT ANTECUBITAL Performed at [facility], [address], [place], [place] [address]    Special Requests   Final    BOTTLES DRAWN AEROBIC AND ANAEROBIC Blood Culture adequate volume Performed at [facility], [address], [place], [place] [address]    Culture   Final     NO GROWTH 5 DAYS Performed at [facility], [address], [place], [place] [address]    Report Status 11/11/2022 FINAL  Final  Culture, blood (routine x 2)     Status: None   Collection Time: 11/07/22  8:15 AM   Specimen: BLOOD RIGHT ARM  Result Value Ref Range Status   Specimen Description BLOOD RIGHT ARM  Final   Special Requests AEROBIC BOTTLE ONLY Blood Culture adequate volume  Final   Culture   Final    NO GROWTH 5 DAYS Performed at [facility], [address], [place], [place] [address]    Report Status 11/12/2022 FINAL  Final  Culture, blood (single) w Reflex to ID Panel     Status: None   Collection Time: 11/07/22  9:36 AM   Specimen: BLOOD  Result Value Ref Range Status   Specimen Description BLOOD SITE NOT SPECIFIED  Final   Special Requests AEROBIC BOTTLE ONLY Blood Culture adequate volume  Final   Culture   Final    NO GROWTH 5 DAYS Performed at [facility], [address], [place], [place] [address]    Report Status 11/12/2022 FINAL  Final     Serology:   Imaging: If present, new imagings (plain films, ct scans, and mri) have been personally visualized and interpreted; radiology reports have been reviewed. Decision making incorporated into the Impression / Recommendations.   9/10 bilateral LE duplex US RIGHT:  - No evidence of common femoral vein obstruction.    LEFT:   - There is no evidence of deep vein thrombosis in the lower extremity.    - No cystic structure found in the popliteal fossa.    - Ultrasound characteristics of enlarged lymph nodes noted in the groin   Raymondo Band, MD [facility] [phone] pager    11/15/2022, 3:36 PM

## 2022-11-15 NOTE — Patient Outreach (Signed)
Care Coordination   Chart Review  Visit Note   11/15/2022 Name: [name] MRN: [id number] DOB: 01-Sep-1965  [name] is a 57 y.o. year old female who sees Claiborne Rigg, NP for primary care. I reviewed patient's chart in preparation to contact patient for a nurse care coordination follow up call.   What matters to the patients health and wellness today?  N/A    Goals Addressed             This Visit's Progress    To better manage lymphedema to bilateral legs/feet       Care Coordination Interventions: Reviewed chart in preparation to contact patient for a nurse care coordination follow up call  Noted patient is currently inpatient at [facility], with admit date of 11/06/22; dx: Sepsis due to Cellulitis        SDOH assessments and interventions completed:  No     Care Coordination Interventions:  Yes, provided   Follow up plan: Follow up call scheduled for 11/22/22 @10 :00 AM    Encounter Outcome:  Patient Visit Completed

## 2022-11-15 NOTE — Plan of Care (Signed)

## 2022-11-15 NOTE — Progress Notes (Signed)
Mobility Specialist: Progress Note   11/15/22 1242  Mobility  Activity Ambulated with assistance in hallway  Level of Assistance Standby assist, set-up cues, supervision of patient - no hands on  Assistive Device Front wheel walker  Distance Ambulated (ft) 70 ft  Activity Response Tolerated well  Mobility Referral Yes  $Mobility charge 1 Mobility  Mobility Specialist Start Time (ACUTE ONLY) 1024  Mobility Specialist Stop Time (ACUTE ONLY) 1034  Mobility Specialist Time Calculation (min) (ACUTE ONLY) 10 min    Pt was agreeable to mobility session - received in chair. Had c/o LLE pain and feeling dizzy during rest and ambulation; pt seemed more irritable and/or frustrated today. Returned to room without fault. Left in chair with all needs met, call bell in reach.   Maurene Capes Mobility Specialist Please contact via SecureChat or Rehab office at [phone]

## 2022-11-15 NOTE — Progress Notes (Signed)
[facility] PROGRESS NOTE   [name] ZOX:[id number] DOB: 05/10/1965 DOA: 11/06/2022  PCP: Claiborne Rigg, NP  Brief History/Interval Summary: [phone] with h/o morbid obesity with lymphedema, antiphospholipid syndrome, PE on Eliquis, and RA who presented on 9/9 with LLE pain and erythema, diagnosed with sepsis due to cellulitis. She was started on Rocephin and Vancomycin.    Subjective/Interval History: Spike fever last night 101.  Report pain and redness improving LE.  Denies cough, dysuria. Diarrhea.   Assessment/Plan:  Left lower extremity cellulitis/sepsis, present on admission  -Patient presented with fever, tachycardia, leukocytosis, source of infection lower extremity cellulitis -Patient with h/o severe lymphedema of BLE - MRI was done which did not reveal any abscess.   -Evaluated by infectious disease.  Changed over to cefazolin.  Subsequently changed over to cefadroxil.  -Patient continued to spike fever, ID has been reconsulted.  Will continue to monitor for another 24 hours - Doppler studies are negative for DVT.  -Blood culture negative.   AKI, prerenal secondary to dehydration Baseline creatinine 0.9  Cr peak to  1.66.   Improved with IV hydration.   Resolved.   Lymphedema Significant baseline lymphedema with ambulatory dysfunction.  Her left leg is chronically larger than right. Needs follow up lymphedema clinic.   Murmur/small pericardial effusion Echocardiogram was done which does not show any significant valvular abnormality.  Small pericardial effusion was noted.  This was seen previously as well.  Thyroid function tests were normal.  Essential hypertension Medications on hold due to soft BP>   Normocytic anemia Drop in hemoglobin likely dilutional.   No evidence of bleeding.  Hb stable.    History of pulmonary embolism on Eliquis Continue Eliquis.     Rheumatoid arthritis Hold methotrexate, Cimzia in setting of infection.     GERD Continue with  famotidine   Physical debility PT OT assessment Fall precautions.   Isolated elevated T. Bili T. bili 3.5 on presentation.  Possibly due to sepsis.  Bili down to 1.6 Follow up out patient.   Morbid obesity: need life style modification.   Estimated body mass index is 49.62 kg/m as calculated from the following:   Height as of this encounter: 5\' 9"  (1.753 m).   Weight as of this encounter: 152.4 kg.  DVT Prophylaxis: On Eliquis Code Status: Full code Family Communication: Discussed with patient Disposition Plan: Anticipate discharge home tomorrow if remain afebrile.    Medications: Scheduled:  apixaban  5 mg Oral BID   cefadroxil  1,000 mg Oral BID   ezetimibe  10 mg Oral Daily   famotidine  40 mg Oral Daily   folic acid  1 mg Oral Daily   Vitamin D (Ergocalciferol)  50,000 Units Oral Q Sat   Continuous:   JYN:[id number], albuterol, melatonin, midodrine, oxyCODONE, polyethylene glycol, prochlorperazine  Antibiotics: Anti-infectives (From admission, onward)    Start     Dose/Rate Route Frequency Ordered Stop   11/13/22 1130  cefadroxil (DURICEF) capsule 1,000 mg        1,000 mg Oral 2 times daily 11/13/22 1038 12/04/22 0959   11/13/22 0000  cefadroxil (DURICEF) 500 MG capsule        1,000 mg Oral 2 times daily 11/13/22 1028 12/04/22 2359   11/10/22 1400  ceFAZolin (ANCEF) IVPB 2g/100 mL premix  Status:  Discontinued        2 g 200 mL/hr over 30 Minutes Intravenous Every 8 hours 11/10/22 1256 11/13/22 1038   11/07/22 2200  vancomycin (VANCOREADY)  IVPB 1250 mg/250 mL  Status:  Discontinued        1,250 mg 166.7 mL/hr over 90 Minutes Intravenous Every 12 hours 11/07/22 0552 11/10/22 1256   11/07/22 0645  vancomycin (VANCOREADY) IVPB 2000 mg/400 mL        2,000 mg 200 mL/hr over 120 Minutes Intravenous  Once 11/07/22 0552 11/07/22 1036   11/07/22 0600  cefTRIAXone (ROCEPHIN) 2 g in sodium chloride 0.9 % 100 mL IVPB  Status:  Discontinued         2 g 200 mL/hr over 30 Minutes Intravenous Daily 11/07/22 0542 11/10/22 1256   11/06/22 2330  clindamycin (CLEOCIN) IVPB 600 mg        600 mg 100 mL/hr over 30 Minutes Intravenous  Once 11/06/22 2322 11/07/22 0128       Objective:  Vital Signs  Vitals:   11/14/22 2200 11/15/22 0444 11/15/22 0828 11/15/22 1345  BP: (!) 158/96 116/60 108/60 (!) 122/104  Pulse: 98 98 95 92  Resp: 16 16 17 18   Temp: (!) 101.4 F (38.6 C) 99.5 F (37.5 C) 99.2 F (37.3 C) 98.8 F (37.1 C)  TempSrc: Oral Oral Oral Oral  SpO2: 100% 98% 98% 99%  Weight:      Height:        Intake/Output Summary (Last 24 hours) at 11/15/2022 1541 Last data filed at 11/14/2022 2210 Gross per 24 hour  Intake 360 ml  Output --  Net 360 ml     Filed Weights   11/06/22 1404  Weight: (!) 152.4 kg    General appearance: NAD Resp: CTA Cardio: S 1, S 2 RRR GI: BS present, soft, nt Extremities: left LE lymphedema. Less redness.   Lab Results:  Data Reviewed: I have personally reviewed following labs and reports of the imaging studies  CBC: Recent Labs  Lab 11/09/22 0627 11/10/22 0321 11/12/22 0350 11/14/22 0417 11/15/22 0359  WBC 8.1 11.5* 10.8* 9.0 10.9*  HGB 9.6* 9.4* 9.0* 9.0* 9.3*  HCT 30.9* 30.1* 29.5* 29.0* 29.4*  MCV 93.9 94.1 95.2 93.9 93.9  PLT 195 204 244 319 321    Basic Metabolic Panel: Recent Labs  Lab 11/09/22 0627 11/10/22 0321 11/12/22 0350 11/14/22 0417  NA 134* 136 134* 135  K 4.1 4.1 3.7 3.7  CL 104 103 104 103  CO2 22 24 25 27   GLUCOSE 114* 117* 109* 106*  BUN 10 12 8 6   CREATININE 0.87 0.94 0.91 0.82  CALCIUM 8.4* 8.5* 8.6* 8.4*    GFR: Estimated Creatinine Clearance: 124.7 mL/min (by C-G formula based on SCr of 0.82 mg/dL).  Liver Function Tests: Recent Labs  Lab 11/10/22 0321  AST 15  ALT 20  ALKPHOS 36*  BILITOT 1.6*  PROT 6.0*  ALBUMIN 2.5*     Recent Results (from the past 240 hour(s))  Resp panel by RT-PCR (RSV, Flu A&B, Covid) Anterior  Nasal Swab     Status: None   Collection Time: 11/06/22  2:36 PM   Specimen: Anterior Nasal Swab  Result Value Ref Range Status   SARS Coronavirus 2 by RT PCR NEGATIVE NEGATIVE Final    Comment: (NOTE) SARS-CoV-2 target nucleic acids are NOT DETECTED.  The SARS-CoV-2 RNA is generally detectable in upper respiratory specimens during the acute phase of infection. The lowest concentration of SARS-CoV-2 viral copies this assay can detect is 138 copies/mL. A negative result does not preclude SARS-Cov-2 infection and should not be used as the sole basis for treatment or other patient  management decisions. A negative result may occur with  improper specimen collection/handling, submission of specimen other than nasopharyngeal swab, presence of viral mutation(s) within the areas targeted by this assay, and inadequate number of viral copies(<138 copies/mL). A negative result must be combined with clinical observations, patient history, and epidemiological information. The expected result is Negative.  Fact Sheet for Patients:  BloggerCourse.com  Fact Sheet for Healthcare Providers:  SeriousBroker.it  This test is no t yet approved or cleared by the [place] FDA and  has been authorized for detection and/or diagnosis of SARS-CoV-2 by FDA under an Emergency Use Authorization (EUA). This EUA will remain  in effect (meaning this test can be used) for the duration of the COVID-19 declaration under Section 564(b)(1) of the Act, 21 U.S.C.section 360bbb-3(b)(1), unless the authorization is terminated  or revoked sooner.       Influenza A by PCR NEGATIVE NEGATIVE Final   Influenza B by PCR NEGATIVE NEGATIVE Final    Comment: (NOTE) The Xpert Xpress SARS-CoV-2/FLU/RSV plus assay is intended as an aid in the diagnosis of influenza from Nasopharyngeal swab specimens and should not be used as a sole basis for treatment. Nasal washings  and aspirates are unacceptable for Xpert Xpress SARS-CoV-2/FLU/RSV testing.  Fact Sheet for Patients: BloggerCourse.com  Fact Sheet for Healthcare Providers: SeriousBroker.it  This test is not yet approved or cleared by the [place] FDA and has been authorized for detection and/or diagnosis of SARS-CoV-2 by FDA under an Emergency Use Authorization (EUA). This EUA will remain in effect (meaning this test can be used) for the duration of the COVID-19 declaration under Section 564(b)(1) of the Act, 21 U.S.C. section 360bbb-3(b)(1), unless the authorization is terminated or revoked.     Resp Syncytial Virus by PCR NEGATIVE NEGATIVE Final    Comment: (NOTE) Fact Sheet for Patients: BloggerCourse.com  Fact Sheet for Healthcare Providers: SeriousBroker.it  This test is not yet approved or cleared by the [place] FDA and has been authorized for detection and/or diagnosis of SARS-CoV-2 by FDA under an Emergency Use Authorization (EUA). This EUA will remain in effect (meaning this test can be used) for the duration of the COVID-19 declaration under Section 564(b)(1) of the Act, 21 U.S.C. section 360bbb-3(b)(1), unless the authorization is terminated or revoked.  Performed at [facility], [address], [place], [place] [address]   Culture, blood (routine x 2)     Status: None   Collection Time: 11/06/22  9:01 PM   Specimen: BLOOD  Result Value Ref Range Status   Specimen Description   Final    BLOOD LEFT ANTECUBITAL Performed at [facility], [address], [place], [place] [address]    Special Requests   Final    BOTTLES DRAWN AEROBIC AND ANAEROBIC Blood Culture adequate volume Performed at [facility], [address], [place], [place] [address]    Culture   Final    NO GROWTH 5 DAYS Performed at [facility], [address] [place], [place] [address]    Report Status 11/11/2022 FINAL  Final  Culture, blood (routine x 2)     Status: None   Collection Time: 11/07/22  8:15 AM   Specimen: BLOOD RIGHT ARM  Result Value Ref Range Status   Specimen Description BLOOD RIGHT ARM  Final   Special Requests AEROBIC BOTTLE ONLY Blood Culture adequate volume  Final   Culture   Final    NO GROWTH 5 DAYS Performed  at [facility], [address] [place], [place] [address]    Report Status 11/12/2022 FINAL  Final  Culture, blood (single) w Reflex to ID Panel     Status: None   Collection Time: 11/07/22  9:36 AM   Specimen: BLOOD  Result Value Ref Range Status   Specimen Description BLOOD SITE NOT SPECIFIED  Final   Special Requests AEROBIC BOTTLE ONLY Blood Culture adequate volume  Final   Culture   Final    NO GROWTH 5 DAYS Performed at [facility], [address] [place], [place] [address]    Report Status 11/12/2022 FINAL  Final      Radiology Studies: No results found.     LOS: 8 days   Peyton Spengler A RegaladoMD>   [facility] Web designer on [email or web address]  11/15/2022, 3:41 PM

## 2022-11-16 ENCOUNTER — Other Ambulatory Visit (HOSPITAL_COMMUNITY): Payer: Self-pay

## 2022-11-16 ENCOUNTER — Encounter: Payer: Medicare HMO | Admitting: Occupational Therapy

## 2022-11-16 ENCOUNTER — Inpatient Hospital Stay (HOSPITAL_COMMUNITY): Payer: Medicare HMO

## 2022-11-16 DIAGNOSIS — A419 Sepsis, unspecified organism: Secondary | ICD-10-CM | POA: Diagnosis not present

## 2022-11-16 DIAGNOSIS — R0989 Other specified symptoms and signs involving the circulatory and respiratory systems: Secondary | ICD-10-CM | POA: Diagnosis not present

## 2022-11-16 DIAGNOSIS — L0390 Cellulitis, unspecified: Secondary | ICD-10-CM | POA: Diagnosis not present

## 2022-11-16 DIAGNOSIS — R509 Fever, unspecified: Secondary | ICD-10-CM | POA: Diagnosis not present

## 2022-11-16 DIAGNOSIS — R5383 Other fatigue: Secondary | ICD-10-CM | POA: Diagnosis not present

## 2022-11-16 DIAGNOSIS — R0602 Shortness of breath: Secondary | ICD-10-CM | POA: Diagnosis not present

## 2022-11-16 NOTE — Plan of Care (Signed)
Problem: Education: Goal: Knowledge of General Education information will improve Description: Including pain rating scale, medication(s)/side effects and non-pharmacologic comfort measures Outcome: Progressing   Problem: Health Behavior/Discharge Planning: Goal: Ability to manage health-related needs will improve Outcome: Progressing   Problem: Skin Integrity: Goal: Risk for impaired skin integrity will decrease Outcome: Progressing

## 2022-11-16 NOTE — Progress Notes (Signed)
Discharge summary (AVS).  Provided to pt with instructions, Meds from [facility] given, Pt verbalized understanding of instructions. No complaints. Pt's family is responsible for her ride. Pt d/c to home as ordered.

## 2022-11-16 NOTE — Plan of Care (Signed)
Problem: Education: Goal: Knowledge of General Education information will improve Description: Including pain rating scale, medication(s)/side effects and non-pharmacologic comfort measures Outcome: Progressing   Problem: Clinical Measurements: Goal: Will remain free from infection Outcome: Progressing

## 2022-11-16 NOTE — Discharge Summary (Signed)
Physician Discharge Summary   Patient: [name] MRN: [id number] DOB: [birth year]  Admit date:     11/06/2022  Discharge date: 11/16/22  Discharge Physician: Alba Cory   PCP: Claiborne Rigg, NP   Recommendations at discharge:    Follow up resolution of cellulitis.   Discharge Diagnoses: Principal Problem:   Sepsis due to cellulitis [facility]) Active Problems:   Rheumatoid arthritis (HCC)   History of pulmonary embolus (PE)   Morbid obesity (HCC)   Lymphedema   AKI (acute kidney injury) (HCC)   Murmur, cardiac   Essential hypertension   Elevated bilirubin  Resolved Problems:   * No resolved hospital problems. [facility] Course: [phone] with h/o morbid obesity with lymphedema, antiphospholipid syndrome, PE on Eliquis, and RA who presented on 9/9 with LLE pain and erythema, diagnosed with sepsis due to cellulitis. She was started on Rocephin and Vancomycin.     Assessment and Plan:  Left lower extremity cellulitis/sepsis, present on admission  -Patient presented with fever, tachycardia, leukocytosis, source of infection lower extremity cellulitis -Patient with h/o severe lymphedema of BLE - MRI was done which did not reveal any abscess.   -Evaluated by infectious disease.  Changed over to cefazolin.  Subsequently changed over to cefadroxil.  -Patient continued to spike fever, ID has been reconsulted.  Will continue to monitor for another 24 hours - Doppler studies are negative for DVT.  -Blood culture negative.   fever curve decreasing, BP stable. Chest x ray negative for infection. Plan to discharge on 3 weeks antibiotics. Patient was advised to return if Symptoms get worse.  LE redness improving.   AKI, prerenal secondary to dehydration Baseline creatinine 0.9  Cr peak to  1.66.   Improved with IV hydration.   Resolved.    Lymphedema Significant baseline lymphedema with ambulatory dysfunction.  Her left leg is chronically larger than right. Needs follow  up lymphedema clinic.    Murmur/small pericardial effusion Echocardiogram was done which does not show any significant valvular abnormality.  Small pericardial effusion was noted.  This was seen previously as well.  Thyroid function tests were normal.   Essential hypertension Medications on hold due to soft BP>    Normocytic anemia Drop in hemoglobin likely dilutional.   No evidence of bleeding.  Hb stable.    History of pulmonary embolism on Eliquis Continue Eliquis.     Rheumatoid arthritis Hold methotrexate, Cimzia in setting of infection.    GERD Continue with  famotidine   Physical debility PT OT assessment Fall precautions.   Isolated elevated T. Bili T. bili 3.5 on presentation.  Possibly due to sepsis.  Bili down to 1.6 Follow up out patient.    Morbid obesity: need life style modification.    Estimated body mass index is 49.62 kg/m as calculated from the following:   Height as of this encounter: 5\' 9"  (1.753 m).   Weight as of this encounter: 152.4 kg.       Consultants: ID Procedures performed; none Disposition: Home Diet recommendation:  Discharge Diet Orders (From admission, onward)     Start     Ordered   11/16/22 0000  Diet - low sodium heart healthy        11/16/22 1029           Cardiac diet DISCHARGE MEDICATION: Allergies as of 11/16/2022       Reactions   Abatacept Anaphylaxis   Other reaction(s): Anaphylaxis-Streptomycin Clickject Other reaction(s): Anaphylaxis-Streptomycin   Bee Venom  Anaphylaxis   Augmentin [amoxicillin-pot Clavulanate] Hives, Itching, Other (See Comments)   Did PCN reaction causing immediate rash, facial/tongue/throat swelling, SOB or lightheadedness with hypotension: yes Did PCN reaction causing severe rash involving mucus membranes or skin necrosis: no Has patient had a PCN reaction that required hospitalization: in hospital Has patient had a PCN reaction occurring within the last 10 years: no If all of the  above answers are "NO", then may proceed with Cephalosporin use. Pt reports no recollection of any reactions when taking penicillin in past   Latex Hives, Other (See Comments)   Local reaction (welts). Patient denies any wheezing or other reaction with latex exposure   Nulytely [peg [id number] Bicarb-nacl]    NAUSEA AND VOMITING. MAY TOLERATE LOW VOLUME PREP.        Medication List     STOP taking these medications    Cimzia 2 X 200 MG/ML prefilled syringe Generic drug: certolizumab pegol   losartan 25 MG tablet Commonly known as: COZAAR   methotrexate 1 g injection Commonly known as: 50 mg/ml       TAKE these medications    albuterol 108 (90 Base) MCG/ACT inhaler Commonly known as: VENTOLIN HFA Inhale 2 puffs into the lungs every 6 (six) hours as needed for wheezing or shortness of breath.   apixaban 5 MG Tabs tablet Commonly known as: Eliquis Take 1 tablet (5 mg total) by mouth 2 (two) times daily.   Bariatric Rollator Misc Please provide patient with insurance approved bariatric rollator walker. I89.0, R53.81, R26.81   cefadroxil 500 MG capsule Commonly known as: DURICEF Take 2 capsules (1,000 mg total) by mouth 2 (two) times daily for 21 days.   ergocalciferol 1.25 MG (50000 UT) capsule Commonly known as: VITAMIN D2 1 capsule Orally q week for 30 days   ezetimibe 10 MG tablet Commonly known as: Zetia Take 1 tablet (10 mg total) by mouth daily. For high cholesterol   famotidine 40 MG tablet Commonly known as: Pepcid Take 1 tablet (40 mg total) by mouth daily.   folic acid 1 MG tablet Commonly known as: FOLVITE Take 1 tablet by mouth once daily   meclizine 12.5 MG tablet Commonly known as: ANTIVERT Take 1-2 tablets (12.5-25 mg total) by mouth 3 (three) times daily as needed for dizziness.        Follow-up Information     Claiborne Rigg, NP Follow up.   Specialty: Nurse Practitioner Contact information: [address] [place]  [address] [phone]         Claiborne Rigg, NP Follow up in 1 week(s).   Specialty: Nurse Practitioner Contact information: [address] [place] [address] [phone]                Discharge Exam: Ceasar Mons Weights   11/06/22 1404  Weight: (!) 152.4 kg   General; NAD  Condition at discharge: stable  The results of significant diagnostics from this hospitalization (including imaging, microbiology, ancillary and laboratory) are listed below for reference.   Imaging Studies: DG CHEST PORT 1 VIEW  Result Date: 11/16/2022 CLINICAL DATA:  Fever, fatigue, shortness of breath EXAM: PORTABLE CHEST - 1 VIEW COMPARISON:  11/06/2022 FINDINGS: Stable central pulmonary vascular congestion. No new infiltrate or overt interstitial edema. Stable borderline cardiomegaly. No effusion. Thoracic dextroscoliosis. IMPRESSION: Borderline cardiomegaly with central pulmonary vascular congestion. Electronically Signed   By: Corlis Leak M.D.   On: 11/16/2022 08:11   MR TIBIA FIBULA LEFT W WO CONTRAST  Result Date: 11/10/2022 CLINICAL DATA:  Morbid obesity, lymphedema, cellulitis EXAM: MRI OF LOWER LEFT EXTREMITY WITHOUT AND WITH CONTRAST TECHNIQUE: Multiplanar, multisequence MR imaging of the left lower leg was performed both before and after administration of intravenous contrast. CONTRAST:  10mL GADAVIST GADOBUTROL 1 MMOL/ML IV SOLN COMPARISON:  None Available. FINDINGS: Bones/Joint/Cartilage No fracture or dislocation. Normal alignment. No joint effusion. No marrow signal abnormality. Severe osteoarthritis of the left medial femorotibial compartments. Muscles and Tendons Mild generalized muscle atrophy throughout the left lower leg. Soft tissue Generalized skin thickening throughout the left lower leg with soft tissue edema throughout the subcutaneous fat and to lesser extent in the contralateral right leg. Differential considerations include diffuse cellulitis versus lymphedema. No soft tissue  mass. IMPRESSION: 1. Generalized skin thickening throughout the left lower leg with soft tissue edema throughout the subcutaneous fat and to lesser extent in the contralateral right leg. Differential considerations include diffuse cellulitis versus lymphedema. 2. No osteomyelitis of the left lower leg. Electronically Signed   By: Elige Ko M.D.   On: 11/10/2022 11:32   ECHOCARDIOGRAM COMPLETE  Result Date: 11/08/2022    ECHOCARDIOGRAM REPORT   Patient Name:   [name] Date of Exam: 11/08/2022 Medical Rec #:  [id number]         Height:       69.0 in Accession #:    [id number]        Weight:       336.0 lb Date of Birth:  Jun 04, 1965        BSA:          2.576 m Patient Age:    56 years          BP:           111/66 mmHg Patient Gender: F                 HR:           69 bpm. Exam Location:  Inpatient Procedure: 2D Echo, Cardiac Doppler and Color Doppler Indications:    R01.1 Murmur  History:        Patient has prior history of Echocardiogram examinations, most                 recent 04/08/2021. Abnormal ECG, Signs/Symptoms:Edema, Murmur,                 Shortness of Breath, Dyspnea and Chest Pain; Risk                 Factors:Hypertension.  Sonographer:    Sheralyn Boatman RDCS Referring Phys: [id number] JENNIFER YATES  Sonographer Comments: Patient is obese. IMPRESSIONS  1. Left ventricular ejection fraction, by estimation, is 60 to 65%. The left ventricle has normal function. The left ventricle has no regional wall motion abnormalities. There is mild left ventricular hypertrophy. Left ventricular diastolic parameters were normal. The average left ventricular global longitudinal strain is -20.0 %. The global longitudinal strain is normal.  2. Right ventricular systolic function is normal. The right ventricular size is normal. There is normal pulmonary artery systolic pressure.  3. A small pericardial effusion is present.  4. The mitral valve is normal in structure. No evidence of mitral valve regurgitation.  5. Very  mild aortic valve mean gradient of 9 mmHg. The aortic valve is grossly normal. Aortic valve regurgitation is not visualized.  6. The inferior vena cava is normal in size with greater than 50% respiratory variability, suggesting right atrial pressure of  3 mmHg. Conclusion(s)/Recommendation(s): Normal biventricular function without evidence of hemodynamically significant valvular heart disease. FINDINGS  Left Ventricle: No LVOT obstruction. Left ventricular ejection fraction, by estimation, is 60 to 65%. The left ventricle has normal function. The left ventricle has no regional wall motion abnormalities. The average left ventricular global longitudinal strain is -20.0 %. The global longitudinal strain is normal. The left ventricular internal cavity size was normal in size. There is mild left ventricular hypertrophy. Left ventricular diastolic parameters were normal. Right Ventricle: The right ventricular size is normal. Right ventricular systolic function is normal. There is normal pulmonary artery systolic pressure. The tricuspid regurgitant velocity is 2.57 m/s, and with an assumed right atrial pressure of 8 mmHg,  the estimated right ventricular systolic pressure is 34.4 mmHg. Left Atrium: Left atrial size was normal in size. Right Atrium: Right atrial size was normal in size. Pericardium: A small pericardial effusion is present. Mitral Valve: The mitral valve is normal in structure. No evidence of mitral valve regurgitation. Tricuspid Valve: Tricuspid valve regurgitation is mild. Aortic Valve: Very mild aortic valve mean gradient of 9 mmHg. The aortic valve is grossly normal. Aortic valve regurgitation is not visualized. Aortic valve mean gradient measures 9.0 mmHg. Aortic valve peak gradient measures 16.5 mmHg. Pulmonic Valve: Pulmonic valve regurgitation is not visualized. Aorta: The aortic root and ascending aorta are structurally normal, with no evidence of dilitation. Venous: The inferior vena cava is normal  in size with greater than 50% respiratory variability, suggesting right atrial pressure of 3 mmHg. IAS/Shunts: No atrial level shunt detected by color flow Doppler.  LEFT VENTRICLE PLAX 2D LVIDd:         4.40 cm      Diastology LVIDs:         2.40 cm      LV e' medial:    7.29 cm/s LV PW:         1.10 cm      LV E/e' medial:  13.4 LV IVS:        1.20 cm      LV e' lateral:   8.59 cm/s LVOT diam:     2.20 cm      LV E/e' lateral: 11.4 LVOT Area:     3.80 cm                             2D Longitudinal Strain                             2D Strain GLS Avg:     -20.0 % LV Volumes (MOD) LV vol d, MOD A2C: 106.0 ml LV vol d, MOD A4C: 59.1 ml LV vol s, MOD A2C: 25.9 ml LV vol s, MOD A4C: 19.1 ml LV SV MOD A2C:     80.1 ml LV SV MOD A4C:     59.1 ml LV SV MOD BP:      59.7 ml RIGHT VENTRICLE             IVC RV S prime:     13.10 cm/s  IVC diam: 1.90 cm TAPSE (M-mode): 2.4 cm LEFT ATRIUM             Index        RIGHT ATRIUM           Index LA diam:        3.50 cm 1.36 cm/m  RA Area:     16.10 cm LA Vol (A2C):   34.6 ml 13.43 ml/m  RA Volume:   39.70 ml  15.41 ml/m LA Vol (A4C):   55.7 ml 21.63 ml/m LA Biplane Vol: 45.6 ml 17.70 ml/m  AORTIC VALVE               PULMONIC VALVE AV Vmax:      203.00 cm/s  PR End Diast Vel: 1.37 msec AV Vmean:     136.000 cm/s AV VTI:       0.382 m AV Peak Grad: 16.5 mmHg AV Mean Grad: 9.0 mmHg  AORTA Ao Root diam: 3.20 cm Ao Asc diam:  3.10 cm MITRAL VALVE               TRICUSPID VALVE MV Area (PHT): 3.73 cm    TR Peak grad:   26.4 mmHg MV Decel Time: 203 msec    TR Vmax:        257.00 cm/s MV E velocity: 97.80 cm/s MV A velocity: 81.50 cm/s  SHUNTS MV E/A ratio:  1.20        Systemic Diam: 2.20 cm [name] Electronically signed by [name] Signature Date/Time: 11/08/2022/2:51:19 PM    Final    VAS [place] LOWER EXTREMITY VENOUS (DVT) (7a-7p)  Result Date: 11/07/2022  Lower Venous DVT Study Patient Name:  [name]  Date of Exam:   11/07/2022 Medical Rec #: [id number]           Accession #:    [id number] Date of Birth: [birth year]         Patient Gender: F Patient Age:   82 years Exam Location:  [facility] Procedure:      VAS [place] LOWER EXTREMITY VENOUS (DVT) Referring Phys: Evlyn Kanner --------------------------------------------------------------------------------  Indications: Left lower extremity cellulitis. Other Indications: History of DVT affecting the contralateral extremity in 2019. Limitations: Poor ultrasound/tissue interface. Skin changes. Comparison Study: Multiple prior lower extremity venous examinations. Most                   recent right lower on 07/27/2020 was negative for DVT. Performing Technologist: Jean Rosenthal RDMS, RVT  Examination Guidelines: A complete evaluation includes B-mode imaging, spectral Doppler, color Doppler, and power Doppler as needed of all accessible portions of each vessel. Bilateral testing is considered an integral part of a complete examination. Limited examinations for reoccurring indications may be performed as noted. The reflux portion of the exam is performed with the patient in reverse Trendelenburg.  +-----+---------------+---------+-----------+----------+--------------+ RIGHTCompressibilityPhasicitySpontaneityPropertiesThrombus Aging +-----+---------------+---------+-----------+----------+--------------+ CFV  Full           Yes      Yes                                 +-----+---------------+---------+-----------+----------+--------------+   +---------+---------------+---------+-----------+----------+--------------+ LEFT     CompressibilityPhasicitySpontaneityPropertiesThrombus Aging +---------+---------------+---------+-----------+----------+--------------+ CFV      Full           Yes      Yes                                 +---------+---------------+---------+-----------+----------+--------------+ SFJ      Full                                                         +---------+---------------+---------+-----------+----------+--------------+  FV Prox  Full                                                        +---------+---------------+---------+-----------+----------+--------------+ FV Mid   Full                                                        +---------+---------------+---------+-----------+----------+--------------+ FV DistalFull           Yes      Yes                                 +---------+---------------+---------+-----------+----------+--------------+ PFV      Full                                                        +---------+---------------+---------+-----------+----------+--------------+ POP      Full           Yes      Yes                                 +---------+---------------+---------+-----------+----------+--------------+ PTV      Full                                                        +---------+---------------+---------+-----------+----------+--------------+ PERO     Full                                                        +---------+---------------+---------+-----------+----------+--------------+     Summary: RIGHT: - No evidence of common femoral vein obstruction.  LEFT: - There is no evidence of deep vein thrombosis in the lower extremity.  - No cystic structure found in the popliteal fossa.  - Ultrasound characteristics of enlarged lymph nodes noted in the groin.  *See table(s) above for measurements and observations. Electronically signed by Lemar Livings MD on 11/07/2022 at 5:40:29 PM.    Final    DG Chest Port 1 View  Result Date: 11/06/2022 CLINICAL DATA:  Lymphedema, fatigue, shortness of breath. EXAM: PORTABLE CHEST 1 VIEW COMPARISON:  October 10, 2022 FINDINGS: The cardiac silhouette is borderline in size and unchanged in appearance. Stable, prominent bronchovascular lung markings are seen within the right lung base. There is no evidence of focal consolidation, overt pulmonary  edema, pleural effusion or pneumothorax. Mild dextroscoliosis of the mid to lower thoracic spine is present. IMPRESSION: Borderline cardiomegaly without evidence of acute or active cardiopulmonary disease. Electronically Signed   By: Aram Candela M.D.   On: 11/06/2022 23:55   DG  Foot Complete Left  Result Date: 11/06/2022 CLINICAL DATA:  Leg pain and swelling EXAM: LEFT FOOT - COMPLETE 3+ VIEW COMPARISON:  02/21/2021 FINDINGS: Limited by habitus and positioning. Bones appear demineralized. No definitive fracture or dislocation. Pes planus deformity with collapse of the arch. Small plantar calcaneal spur. Copious soft tissue edema IMPRESSION: No definite acute osseous abnormality allowing for limitations of habitus and positioning Electronically Signed   By: Jasmine Pang M.D.   On: 11/06/2022 22:20   DG Tibia/Fibula Left  Result Date: 11/06/2022 CLINICAL DATA:  Leg pain edema EXAM: LEFT TIBIA AND FIBULA - 2 VIEW COMPARISON:  None Available. FINDINGS: Considerable soft tissue edema. No fracture or malalignment. No osseous destructive change. IMPRESSION: Soft tissue edema.  No acute osseous abnormality Electronically Signed   By: Jasmine Pang M.D.   On: 11/06/2022 22:19    Microbiology: Results for orders placed or performed during the hospital encounter of 11/06/22  Resp panel by RT-PCR (RSV, Flu A&B, Covid) Anterior Nasal Swab     Status: None   Collection Time: 11/06/22  2:36 PM   Specimen: Anterior Nasal Swab  Result Value Ref Range Status   SARS Coronavirus 2 by RT PCR NEGATIVE NEGATIVE Final    Comment: (NOTE) SARS-CoV-2 target nucleic acids are NOT DETECTED.  The SARS-CoV-2 RNA is generally detectable in upper respiratory specimens during the acute phase of infection. The lowest concentration of SARS-CoV-2 viral copies this assay can detect is 138 copies/mL. A negative result does not preclude SARS-Cov-2 infection and should not be used as the sole basis for treatment or other  patient management decisions. A negative result may occur with  improper specimen collection/handling, submission of specimen other than nasopharyngeal swab, presence of viral mutation(s) within the areas targeted by this assay, and inadequate number of viral copies(<138 copies/mL). A negative result must be combined with clinical observations, patient history, and epidemiological information. The expected result is Negative.  Fact Sheet for Patients:  BloggerCourse.com  Fact Sheet for Healthcare Providers:  SeriousBroker.it  This test is no t yet approved or cleared by the [place] FDA and  has been authorized for detection and/or diagnosis of SARS-CoV-2 by FDA under an Emergency Use Authorization (EUA). This EUA will remain  in effect (meaning this test can be used) for the duration of the COVID-19 declaration under Section 564(b)(1) of the Act, 21 U.S.C.section 360bbb-3(b)(1), unless the authorization is terminated  or revoked sooner.       Influenza A by PCR NEGATIVE NEGATIVE Final   Influenza B by PCR NEGATIVE NEGATIVE Final    Comment: (NOTE) The Xpert Xpress SARS-CoV-2/FLU/RSV plus assay is intended as an aid in the diagnosis of influenza from Nasopharyngeal swab specimens and should not be used as a sole basis for treatment. Nasal washings and aspirates are unacceptable for Xpert Xpress SARS-CoV-2/FLU/RSV testing.  Fact Sheet for Patients: BloggerCourse.com  Fact Sheet for Healthcare Providers: SeriousBroker.it  This test is not yet approved or cleared by the [place] FDA and has been authorized for detection and/or diagnosis of SARS-CoV-2 by FDA under an Emergency Use Authorization (EUA). This EUA will remain in effect (meaning this test can be used) for the duration of the COVID-19 declaration under Section 564(b)(1) of the Act, 21 U.S.C. section  360bbb-3(b)(1), unless the authorization is terminated or revoked.     Resp Syncytial Virus by PCR NEGATIVE NEGATIVE Final    Comment: (NOTE) Fact Sheet for Patients: BloggerCourse.com  Fact Sheet for Healthcare Providers: SeriousBroker.it  This  test is not yet approved or cleared by the [place] and has been authorized for detection and/or diagnosis of SARS-CoV-2 by FDA under an Emergency Use Authorization (EUA). This EUA will remain in effect (meaning this test can be used) for the duration of the COVID-19 declaration under Section 564(b)(1) of the Act, 21 U.S.C. section 360bbb-3(b)(1), unless the authorization is terminated or revoked.  Performed at [facility], [address] [place], [place] [address]   Culture, blood (routine x 2)     Status: None   Collection Time: 11/06/22  9:01 PM   Specimen: BLOOD  Result Value Ref Range Status   Specimen Description   Final    BLOOD LEFT ANTECUBITAL Performed at [facility], [address] [place], [place] [address]    Special Requests   Final    BOTTLES DRAWN AEROBIC AND ANAEROBIC Blood Culture adequate volume Performed at [facility], [address] [place], [place] [address]    Culture   Final    NO GROWTH 5 DAYS Performed at [facility], [address] [place], [place] [address]    Report Status 11/11/2022 FINAL  Final  Culture, blood (routine x 2)     Status: None   Collection Time: 11/07/22  8:15 AM   Specimen: BLOOD RIGHT ARM  Result Value Ref Range Status   Specimen Description BLOOD RIGHT ARM  Final   Special Requests AEROBIC BOTTLE ONLY Blood Culture adequate volume  Final   Culture   Final    NO GROWTH 5 DAYS Performed at [facility], [address], [place], [place] [address]    Report Status 11/12/2022 FINAL  Final  Culture, blood (single) w Reflex to ID Panel     Status:  None   Collection Time: 11/07/22  9:36 AM   Specimen: BLOOD  Result Value Ref Range Status   Specimen Description BLOOD SITE NOT SPECIFIED  Final   Special Requests AEROBIC BOTTLE ONLY Blood Culture adequate volume  Final   Culture   Final    NO GROWTH 5 DAYS Performed at [facility], [address] [place], [place] [address]    Report Status 11/12/2022 FINAL  Final    Labs: CBC: Recent Labs  Lab 11/10/22 0321 11/12/22 0350 11/14/22 0417 11/15/22 0359  WBC 11.5* 10.8* 9.0 10.9*  HGB 9.4* 9.0* 9.0* 9.3*  HCT 30.1* 29.5* 29.0* 29.4*  MCV 94.1 95.2 93.9 93.9  PLT 204 244 319 321   Basic Metabolic Panel: Recent Labs  Lab 11/10/22 0321 11/12/22 0350 11/14/22 0417  NA 136 134* 135  K 4.1 3.7 3.7  CL 103 104 103  CO2 24 25 27   GLUCOSE 117* 109* 106*  BUN 12 8 6   CREATININE 0.94 0.91 0.82  CALCIUM 8.5* 8.6* 8.4*   Liver Function Tests: Recent Labs  Lab 11/10/22 0321  AST 15  ALT 20  ALKPHOS 36*  BILITOT 1.6*  PROT 6.0*  ALBUMIN 2.5*   CBG: No results for input(s): "GLUCAP" in the last 168 hours.  Discharge time spent: greater than 30 minutes.  Signed: Alba Cory, MD [facility] 11/16/2022

## 2022-11-17 ENCOUNTER — Telehealth: Payer: Self-pay

## 2022-11-17 NOTE — Transitions of Care (Post Inpatient/ED Visit) (Signed)
11/17/2022  Name: [name] MRN: [id number] DOB: [birth year]  Today's TOC FU Call Status: Today's TOC FU Call Status:: Unsuccessful Call (1st Attempt) Unsuccessful Call (1st Attempt) Date: 11/17/22  Attempted to reach the patient regarding the most recent Inpatient/ED visit.  Follow Up Plan: Additional outreach attempts will be made to reach the patient to complete the Transitions of Care (Post Inpatient/ED visit) call.     Antionette Fairy, RN,BSN,CCM [facility] Health/[facility] Care Management Care Management Community Coordinator Direct Phone: [phone] Toll Free: [phone] Fax: [phone]

## 2022-11-17 NOTE — Transitions of Care (Post Inpatient/ED Visit) (Signed)
11/17/2022  Name: [name] MRN: [id number] DOB: 05-Jan-1966  Today's TOC FU Call Status: Today's TOC FU Call Status:: Successful TOC FU Call Completed TOC FU Call Complete Date: 11/17/22 (Incoming call from pt-returning RN CM call) Patient's Name and Date of Birth confirmed.  Transition Care Management Follow-up Telephone Call Date of Discharge: 11/16/22 Discharge Facility: [facility]) Type of Discharge: Inpatient Admission Primary Inpatient Discharge Diagnosis:: "cellulitis of left lower extremity" How have you been since you were released from the hospital?: Better (pt voices she is doing well-rested well-appetite good-taking abxs-LBM today) Any questions or concerns?: Yes Patient Questions/Concerns:: Pt wanting to know why BP(Losartan) stopped Patient Questions/Concerns Addressed: Other: (Discussed with pt per MD notes BP running low while inpt-she lost BP machine and will work on getting one to monitor BP in the home-denies any sxs and f/u with provider during appt if she needs to start back taking BP med)  Items Reviewed: Did you receive and understand the discharge instructions provided?: Yes Medications obtained,verified, and reconciled?: Yes (Medications Reviewed) Any new allergies since your discharge?: No Dietary orders reviewed?: Yes Type of Diet Ordered:: low salt/heart healthy Do you have support at home?: Yes  Medications Reviewed Today: Medications Reviewed Today     Reviewed by Charlyn Minerva, RN (Registered Nurse) on 11/17/22 at 1459  Med List Status: <None>   Medication Order Taking? Sig Documenting Provider Last Dose Status Informant  albuterol (VENTOLIN HFA) 108 (90 Base) MCG/ACT inhaler [id number] Yes Inhale 2 puffs into the lungs every 6 (six) hours as needed for wheezing or shortness of breath. Claiborne Rigg, NP Taking Active Self, Pharmacy Records  apixaban (ELIQUIS) 5 MG TABS tablet [id number] Yes Take 1 tablet (5 mg total) by  mouth 2 (two) times daily. Claiborne Rigg, NP Taking Active Self, Pharmacy Records  cefadroxil (DURICEF) 500 MG capsule [id number] Yes Take 2 capsules (1,000 mg total) by mouth 2 (two) times daily for 21 days. Raymondo Band, MD Taking Active   ergocalciferol (VITAMIN D2) 1.25 MG (50000 UT) capsule [id number] Yes 1 capsule Orally q week for 30 days [provider] Taking Active Self, Pharmacy Records  ezetimibe (ZETIA) 10 MG tablet [id number] Yes Take 1 tablet (10 mg total) by mouth daily. For high cholesterol Claiborne Rigg, NP Taking Active Self, Pharmacy Records  famotidine (PEPCID) 40 MG tablet [id number] Yes Take 1 tablet (40 mg total) by mouth daily. Claiborne Rigg, NP Taking Active Self, Pharmacy Records  folic acid (FOLVITE) 1 MG tablet [id number] Yes Take 1 tablet by mouth once daily Hoy Register, MD Taking Active Self, Pharmacy Records  meclizine (ANTIVERT) 12.5 MG tablet [id number] Yes Take 1-2 tablets (12.5-25 mg total) by mouth 3 (three) times daily as needed for dizziness. Claiborne Rigg, NP Taking Active Self, Pharmacy Records  Misc. Devices (BARIATRIC ROLLATOR) MISC [id number] Yes Please provide patient with insurance approved bariatric rollator walker. I89.0, R53.81, R26.81 Claiborne Rigg, NP Taking Active Self, Pharmacy Records            Home Care and Equipment/Supplies: Were Home Health Services Ordered?: NA Any new equipment or medical supplies ordered?: NA  Functional Questionnaire: Do you need assistance with bathing/showering or dressing?: No Do you need assistance with meal preparation?: No Do you need assistance with eating?: No Do you have difficulty maintaining continence: No Do you need assistance with getting out of bed/getting out of a chair/moving?: No Do you have difficulty managing or taking your medications?: No  Follow up appointments reviewed: PCP Follow-up appointment confirmed?: Yes Date of PCP follow-up appointment?: 12/06/22 Follow-up  Provider: Thalia Party Specialist [facility] Follow-up appointment confirmed?: Yes Date of Specialist follow-up appointment?: 11/30/22 Follow-Up Specialty Provider:: Dr. Renold Don Do you need transportation to your follow-up appointment?: No (pt voices she uses [facility] [place]) Do you understand care options if your condition(s) worsen?: Yes-patient verbalized understanding  SDOH Interventions Today    Flowsheet Row Most Recent Value  SDOH Interventions   Food Insecurity Interventions Intervention Not Indicated  Transportation Interventions Intervention Not Indicated      TOC Interventions Today    Flowsheet Row Most Recent Value  TOC Interventions   TOC Interventions Discussed/Reviewed TOC Interventions Discussed, Arranged PCP follow up less than 12 days/Care Guide scheduled      Interventions Today    Flowsheet Row Most Recent Value  Chronic Disease   Chronic disease during today's visit Hypertension (HTN)  General Interventions   General Interventions Discussed/Reviewed General Interventions Discussed, Durable Medical Equipment (DME), Doctor Visits  Doctor Visits Discussed/Reviewed Doctor Visits Discussed, PCP, Specialist  Durable Medical Equipment (DME) BP Cuff  [pt has misplaced BP machine-encouraged to obtain one and monitor in the home]  PCP/Specialist Visits Compliance with follow-up visit  Education Interventions   Education Provided Provided Education  Provided Verbal Education On Nutrition, Medication, When to see the doctor  Nutrition Interventions   Nutrition Discussed/Reviewed Nutrition Discussed, Fluid intake, Decreasing sugar intake, Portion sizes, Increasing proteins, Decreasing fats, Decreasing salt, Adding fruits and vegetables  Pharmacy Interventions   Pharmacy Dicussed/Reviewed Pharmacy Topics Discussed, Medications and their functions  Safety Interventions   Safety Discussed/Reviewed Safety Discussed       Alessandra Grout [facility]  Health/[facility] Care Management Care Management Community Coordinator Direct Phone: [phone] Toll Free: [phone] Fax: [phone]

## 2022-11-20 ENCOUNTER — Encounter: Payer: Medicare HMO | Admitting: Occupational Therapy

## 2022-11-21 ENCOUNTER — Other Ambulatory Visit (HOSPITAL_BASED_OUTPATIENT_CLINIC_OR_DEPARTMENT_OTHER): Payer: Self-pay

## 2022-11-21 ENCOUNTER — Encounter: Payer: Medicare HMO | Admitting: Occupational Therapy

## 2022-11-22 ENCOUNTER — Other Ambulatory Visit: Payer: Self-pay | Admitting: Internal Medicine

## 2022-11-22 ENCOUNTER — Telehealth: Payer: Self-pay

## 2022-11-22 ENCOUNTER — Other Ambulatory Visit (HOSPITAL_COMMUNITY): Payer: Self-pay

## 2022-11-22 ENCOUNTER — Encounter: Payer: Medicare HMO | Admitting: Occupational Therapy

## 2022-11-22 ENCOUNTER — Ambulatory Visit: Payer: Self-pay

## 2022-11-22 DIAGNOSIS — R197 Diarrhea, unspecified: Secondary | ICD-10-CM

## 2022-11-22 DIAGNOSIS — L03116 Cellulitis of left lower limb: Secondary | ICD-10-CM

## 2022-11-22 MED ORDER — LINEZOLID 600 MG PO TABS: 600.0000 mg | ORAL_TABLET | Freq: Two times a day (BID) | ORAL | 0 refills | Status: AC

## 2022-11-22 NOTE — Patient Instructions (Signed)
Visit Information  Thank you for taking time to visit with me today. Please don't hesitate to contact me if I can be of assistance to you.   Following are the goals we discussed today:   Goals Addressed             This Visit's Progress    To better manage lymphedema to bilateral legs/feet       Care Coordination Interventions: Evaluation of current treatment plan related to cellulitis secondary to lymphedema and patient's adherence to plan as established by provider Reviewed and discussed with patient her recent hospitalization from 11/06/22-11/16/22 to [facility] due to Sepsis due to cellulitis Review of patient status, including review of consultant's reports, relevant laboratory and other test results, and medications completed Determined patient has been experiencing severe, persistent diarrhea x 2 days Placed outbound call to [facility] for Infectious Disease, spoke with Tiffany CMA regarding patient's reported symptoms and concern about C-diff and or reaction to Cefadroxil Determined patient will go into the office of Dr. Renold Don for labs tomorrow am, she will drop off a stool sample as well to rule out C-diff vs antibiotic reaction  Sent secure message to PCP provider regarding patient's reported symptoms and the treatment plan  Placed outbound call to patient to confirm she is aware of the upcoming lab visit and need for a stool sample and patient verbalizes understanding         Our next appointment is by telephone on 12/08/22 at 11:00 AM  Please call the care guide team at [phone] if you need to cancel or reschedule your appointment.   If you are experiencing a Mental Health or Behavioral Health Crisis or need someone to talk to, please call 1-800-273-TALK (toll free, 24 hour hotline)  Patient verbalizes understanding of instructions and care plan provided today and agrees to view in MyChart. Active MyChart status and patient understanding of how to access  instructions and care plan via MyChart confirmed with patient.     Delsa Sale RN BSN CCM [facility], [facility] Health Nurse Care Coordinator  Direct Dial: [phone] Website: [email or web address]

## 2022-11-22 NOTE — Telephone Encounter (Signed)
[facility] Patient Advocate Encounter   Received notification from [facility] that prior authorization for Zyvox is required.   PA submitted on 11/22/22 Key [id number] Status is pending    [facility] Clinic will continue to follow.   Clearance Coots, CPhT Specialty Pharmacy Patient [name] for Infectious Disease Phone: [phone] Fax:  [phone]

## 2022-11-22 NOTE — Telephone Encounter (Signed)
[facility] Patient Advocate Encounter  Prior Authorization for ZYvox has been approved.    PA# [id number] Effective dates: 11/22/22 through 12/20/22  Patients co-pay is $0.00.   [facility] will continue to follow.  Clearance Coots, CPhT Specialty Pharmacy Patient [facility] for Infectious Disease Phone: [phone] Fax:  [phone]

## 2022-11-22 NOTE — Telephone Encounter (Signed)
Spoke with Cala Bradford, she will come in to follow up with Dr. Renold Don next Wednesday.   Sandie Ano, RN

## 2022-11-22 NOTE — Progress Notes (Signed)
Too much diarrhea on cefadroxil  Try zyvox 2 more weeks for cellulitis

## 2022-11-22 NOTE — Telephone Encounter (Signed)
Angle Little, RN called stating that she spoke with patient and patient has been having diarrhea x 2 days all day long. Concerned about c-diff or if she may need to change abx? Advised I would send message back to provider to see what he recommends.    Laurina Fischl Lesli Albee, CMA

## 2022-11-22 NOTE — Telephone Encounter (Signed)
Per Dr.Vu - patient can come in to do labs and get stool kit to test for c-diff. Patient scheduled for labs on 9/26 at 9:15 AM. Will leave stool kit at front desk. Patient aware and agreeable. Patient also scheduled for follow up with Dr.Vu on 10/2.   [name] Lesli Albee, CMA

## 2022-11-22 NOTE — Patient Outreach (Signed)
Care Coordination   Follow Up Visit Note   11/22/2022 Name: [name] MRN: [id number] DOB: 01-18-1966  [name] is a 57 y.o. year old female who sees Claiborne Rigg, NP for primary care. I spoke with  [name] by phone today.  What matters to the patients health and wellness today?  Patient would like to have the diarrhea resolve without complications.     Goals Addressed             This Visit's Progress    To better manage lymphedema to bilateral legs/feet       Care Coordination Interventions: Evaluation of current treatment plan related to cellulitis secondary to lymphedema and patient's adherence to plan as established by provider Reviewed and discussed with patient her recent hospitalization from 11/06/22-11/16/22 to [facility] due to Sepsis due to cellulitis Review of patient status, including review of consultant's reports, relevant laboratory and other test results, and medications completed Determined patient has been experiencing severe, persistent diarrhea x 2 days Placed outbound call to [facility] for Infectious Disease, spoke with Tiffany CMA regarding patient's reported symptoms and concern about C-diff and or reaction to Cefadroxil Determined patient will go into the office of Dr. Renold Don for labs tomorrow am, she will drop off a stool sample as well to rule out C-diff vs antibiotic reaction  Sent secure message to PCP provider regarding patient's reported symptoms and the treatment plan  Placed outbound call to patient to confirm she is aware of the upcoming lab visit and need for a stool sample and patient verbalizes understanding     Interventions Today    Flowsheet Row Most Recent Value  Chronic Disease   Chronic disease during today's visit Other  [Cellulitis,  lymphedema]  General Interventions   General Interventions Discussed/Reviewed General Interventions Discussed, General Interventions Reviewed, Doctor Visits,  Durable Medical Equipment (DME)  Doctor Visits Discussed/Reviewed Doctor Visits Discussed, Doctor Visits Reviewed, PCP, Specialist  Durable Medical Equipment (DME) Dan Humphreys, Bed side commode  Education Interventions   Education Provided Provided Education  Provided Verbal Education On When to see the doctor, Medication  Pharmacy Interventions   Pharmacy Dicussed/Reviewed Pharmacy Topics Discussed, Pharmacy Topics Reviewed, Medications and their functions          SDOH assessments and interventions completed:  No     Care Coordination Interventions:  Yes, provided   Follow up plan: Follow up call scheduled for 12/08/22 @11 :00 AM     Encounter Outcome:  Patient Visit Completed

## 2022-11-22 NOTE — Progress Notes (Signed)
Diarrhea on cefadroxil  ?cdiff vs abx related   Cbc, cmp, stool cdiff toxin

## 2022-11-23 ENCOUNTER — Other Ambulatory Visit: Payer: Medicare HMO

## 2022-11-23 ENCOUNTER — Encounter: Payer: Medicare HMO | Admitting: Occupational Therapy

## 2022-11-23 ENCOUNTER — Other Ambulatory Visit: Payer: Self-pay

## 2022-11-23 DIAGNOSIS — R197 Diarrhea, unspecified: Secondary | ICD-10-CM | POA: Diagnosis not present

## 2022-11-24 ENCOUNTER — Ambulatory Visit: Payer: Self-pay

## 2022-11-24 LAB — CBC WITH DIFFERENTIAL/PLATELET
Absolute Monocytes: 694 {cells}/uL (ref 200–950)
Basophils Absolute: 39 {cells}/uL (ref 0–200)
Basophils Relative: 0.7 %
Eosinophils Absolute: 190 {cells}/uL (ref 15–500)
Eosinophils Relative: 3.4 %
HCT: 31.8 % — ABNORMAL LOW (ref 35.0–45.0)
Hemoglobin: 9.8 g/dL — ABNORMAL LOW (ref 11.7–15.5)
Lymphs Abs: 1546 {cells}/uL (ref 850–3900)
MCH: 28.3 pg (ref 27.0–33.0)
MCHC: 30.8 g/dL — ABNORMAL LOW (ref 32.0–36.0)
MCV: 91.9 fL (ref 80.0–100.0)
MPV: 9.4 fL (ref 7.5–12.5)
Monocytes Relative: 12.4 %
Neutro Abs: 3130 {cells}/uL (ref 1500–7800)
Neutrophils Relative %: 55.9 %
Platelets: 519 10*3/uL — ABNORMAL HIGH (ref 140–400)
RBC: 3.46 Million/uL — ABNORMAL LOW (ref 3.80–5.10)
RDW: 14.3 % (ref 11.0–15.0)
Total Lymphocyte: 27.6 %
WBC: 5.6 10*3/uL (ref 3.8–10.8)

## 2022-11-24 LAB — COMPLETE METABOLIC PANEL WITHOUT GFR
AG Ratio: 0.9 (calc) — ABNORMAL LOW (ref 1.0–2.5)
ALT: 11 U/L (ref 6–29)
AST: 12 U/L (ref 10–35)
Albumin: 3.6 g/dL (ref 3.6–5.1)
Alkaline phosphatase (APISO): 47 U/L (ref 37–153)
BUN/Creatinine Ratio: 6 (calc) (ref 6–22)
BUN: 5 mg/dL — ABNORMAL LOW (ref 7–25)
CO2: 25 mmol/L (ref 20–32)
Calcium: 9.3 mg/dL (ref 8.6–10.4)
Chloride: 107 mmol/L (ref 98–110)
Creat: 0.88 mg/dL (ref 0.50–1.03)
Globulin: 4 g/dL — ABNORMAL HIGH (ref 1.9–3.7)
Glucose, Bld: 101 mg/dL — ABNORMAL HIGH (ref 65–99)
Potassium: 4 mmol/L (ref 3.5–5.3)
Sodium: 140 mmol/L (ref 135–146)
Total Bilirubin: 0.7 mg/dL (ref 0.2–1.2)
Total Protein: 7.6 g/dL (ref 6.1–8.1)
eGFR: 78 mL/min/{1.73_m2}

## 2022-11-24 NOTE — Patient Outreach (Signed)
Care Coordination   Follow Up Visit Note   11/24/2022 Name: [name] MRN: [id number] DOB: 03-Jan-1966  [name] is a 57 y.o. year old female who sees Lauren Rigg, NP for primary care. I spoke with  [name] by phone today.  What matters to the patients health and wellness today?  SW spoke with the patient regarding resources to assist with obtaining a rollator due to her health plan not covering the cost.  Discussed plans for the patient to call [employer] to determine if desired equipment is available.  SDOH assessments and interventions completed:  No     Care Coordination Interventions:  Yes, provided   Interventions Today    Flowsheet Row Most Recent Value  Chronic Disease   Chronic disease during today's visit Other  [Cellulitis, Lymphedema]  General Interventions   General Interventions Discussed/Reviewed General Interventions Discussed, [employer], Communication with  [Advised patient to contact [employer] to determine if a rollator is available]  Communication with RN  [Advised of pt plan to call [employer]]        Follow up plan: No further intervention required.   Encounter Outcome:  Patient Visit Completed   [name], BSW, CDP [facility] Health  [facility], [facility] Social Worker Direct Dial: [phone]  Fax: [phone]

## 2022-11-24 NOTE — Patient Instructions (Signed)
Visit Information  Thank you for taking time to visit with me today. Please don't hesitate to contact me if I can be of assistance to you.   Following are the goals we discussed today:  - Call [employer] at [phone] to determine if a rollator is available - Contact SW as needed  If you are experiencing a Mental Health or Behavioral Health Crisis or need someone to talk to, please call 1-800-273-TALK (toll free, 24 hour hotline) go to [facility] [address], [place] [phone]) call 911  Patient verbalizes understanding of instructions and care plan provided today and agrees to view in MyChart. Active MyChart status and patient understanding of how to access instructions and care plan via MyChart confirmed with patient.     Bevelyn Ngo, BSW, CDP [facility]  [facility], [facility] Social Worker Direct Dial: [phone]  Fax: [phone]

## 2022-11-27 ENCOUNTER — Encounter: Payer: Medicare HMO | Admitting: Occupational Therapy

## 2022-11-28 ENCOUNTER — Encounter: Payer: Medicare HMO | Admitting: Occupational Therapy

## 2022-11-29 ENCOUNTER — Encounter: Payer: Self-pay | Admitting: Internal Medicine

## 2022-11-29 ENCOUNTER — Encounter: Payer: Medicare HMO | Admitting: Occupational Therapy

## 2022-11-29 ENCOUNTER — Ambulatory Visit: Payer: Medicare HMO | Admitting: Internal Medicine

## 2022-11-29 ENCOUNTER — Other Ambulatory Visit: Payer: Self-pay

## 2022-11-29 VITALS — BP 121/77 | HR 107 | Resp 16 | Ht 69.0 in | Wt 336.0 lb

## 2022-11-29 DIAGNOSIS — L03116 Cellulitis of left lower limb: Secondary | ICD-10-CM | POA: Diagnosis not present

## 2022-11-29 DIAGNOSIS — R197 Diarrhea, unspecified: Secondary | ICD-10-CM

## 2022-11-29 NOTE — Progress Notes (Signed)
[facility]  Patient Active Problem List   Diagnosis Date Noted   AKI (acute kidney injury) (HCC) 11/07/2022   Murmur, cardiac 11/07/2022   Essential hypertension 11/07/2022   Elevated bilirubin 11/07/2022   Rheumatoid arthritis (HCC) 03/06/2022   Lymphedema 03/06/2022   Morbid obesity (HCC) 09/14/2021   Chest pain 04/07/2021   Screening for malignant neoplasm of colon 03/09/2021   Constipation 03/09/2021   Iron deficiency anemia 03/09/2021   Body mass index (BMI) 45.0-49.9, adult (HCC) 08/20/2020   Chronic fatigue syndrome 08/20/2020   Polyarthralgia 08/20/2020   Vitamin D deficiency 08/20/2020   Sepsis due to cellulitis (HCC) 12/31/2018   Elephantiasis 12/31/2018   Ischemic chest pain (HCC) 12/31/2018   History of pulmonary embolus (PE) 12/31/2018   Physical deconditioning 08/27/2018   Adenopathy 09/15/2017   Family history of colon cancer 08/01/2017   Taking multiple medications for chronic disease 08/01/2017   Axillary lymphadenopathy 01/26/2017   Urinary incontinence 01/25/2017   Hypokalemia 01/25/2017   Lobar pneumonia (HCC) 01/25/2017   Pressure ulcer of sacral region, stage 2 (HCC) 01/25/2017   Abnormal ECG 01/29/2013   Dyspnea    Menorrhagia with regular cycle 02/22/2011   Anemia 02/22/2011      Subjective:    Patient ID: [name], female    DOB: [birth year], 57 y.o.   MRN: [id number]  Chief Complaint  Patient presents with   Follow-up    Fatigue, weak and dehydrated with diarrhea.     HPI:  [name] is a 57 y.o. female lymphedema, left leg cellulitis, here for hospital f/u   Patient was discharged on planned 3 weeks cefadroxil but started developing watery diarrhea a week prior to this visit 11/29/22 We have rx'ed zyvox but she haven't picked it up yet.   No n/v/f/c/abd pain  Diarrhea would be at most 3-4 times a day. She also uses immodium  Labs done a week prior showed no leukocytosis, or  aki   She still takes cefadroxil 1000 mg twice a day. Legs pain almost gone 80%. Redness not there any more    Allergies  Allergen Reactions   Abatacept Anaphylaxis    Other reaction(s): Anaphylaxis-Streptomycin Clickject Other reaction(s): Anaphylaxis-Streptomycin   Bee Venom Anaphylaxis   Augmentin [Amoxicillin-Pot Clavulanate] Hives, Itching and Other (See Comments)    Did PCN reaction causing immediate rash, facial/tongue/throat swelling, SOB or lightheadedness with hypotension: yes Did PCN reaction causing severe rash involving mucus membranes or skin necrosis: no Has patient had a PCN reaction that required hospitalization: in hospital Has patient had a PCN reaction occurring within the last 10 years: no If all of the above answers are "NO", then may proceed with Cephalosporin use.  Pt reports no recollection of any reactions when taking penicillin in past   Latex Hives and Other (See Comments)    Local reaction (welts). Patient denies any wheezing or other reaction with latex exposure   Nulytely [Peg 3350-Kcl-Na Bicarb-Nacl]     NAUSEA AND VOMITING. MAY TOLERATE LOW VOLUME PREP.      Outpatient Medications Prior to Visit  Medication Sig Dispense Refill   albuterol (VENTOLIN HFA) 108 (90 Base) MCG/ACT inhaler Inhale 2 puffs into the lungs every 6 (six) hours as needed for wheezing or shortness of breath. 18 g 2   apixaban (ELIQUIS) 5 MG TABS tablet Take 1 tablet (5 mg total) by mouth 2 (two) times daily. 90 tablet 3   cefadroxil (DURICEF) 500  MG capsule Take 2 capsules (1,000 mg total) by mouth 2 (two) times daily for 21 days. 84 capsule 0   ergocalciferol (VITAMIN D2) 1.25 MG (50000 UT) capsule 1 capsule Orally q week for 30 days     ezetimibe (ZETIA) 10 MG tablet Take 1 tablet (10 mg total) by mouth daily. For high cholesterol 90 tablet 3   famotidine (PEPCID) 40 MG tablet Take 1 tablet (40 mg total) by mouth daily. 90 tablet 1   folic acid (FOLVITE) 1 MG tablet Take 1  tablet by mouth once daily 90 tablet 1   loperamide (IMODIUM A-D) 2 MG tablet Take 2 mg by mouth 4 (four) times daily as needed for diarrhea or loose stools.     Misc. Devices (BARIATRIC ROLLATOR) MISC Please provide patient with insurance approved bariatric rollator walker. I89.0, R53.81, R26.81 1 each 0   linezolid (ZYVOX) 600 MG tablet Take 1 tablet (600 mg total) by mouth 2 (two) times daily for 14 days. (Patient not taking: Reported on 11/29/2022) 28 tablet 0   meclizine (ANTIVERT) 12.5 MG tablet Take 1-2 tablets (12.5-25 mg total) by mouth 3 (three) times daily as needed for dizziness. (Patient not taking: Reported on 11/29/2022) 60 tablet 0   No facility-administered medications prior to visit.     Social History   Socioeconomic History   Marital status: Single    Spouse name: Not on file   Number of children: 1   Years of education: Not on file   Highest education level: Not on file  Occupational History   Occupation: medical billing    Employer: [employer]  Tobacco Use   Smoking status: Never   Smokeless tobacco: Never  Vaping Use   Vaping status: Never Used  Substance and Sexual Activity   Alcohol use: No   Drug use: No   Sexual activity: Not Currently    Birth control/protection: None, Surgical  Other Topics Concern   Not on file  Social History Narrative   Not on file   Social Determinants of Health   Financial Resource Strain: Low Risk  (04/26/2022)   Overall Financial Resource Strain (CARDIA)    Difficulty of Paying Living Expenses: Not hard at all  Food Insecurity: No Food Insecurity (11/17/2022)   Hunger Vital Sign    Worried About Running Out of Food in the Last Year: Never true    Ran Out of Food in the Last Year: Never true  Transportation Needs: No Transportation Needs (11/17/2022)   PRAPARE - Administrator, Civil Service (Medical): No    Lack of Transportation (Non-Medical): No  Physical Activity: Insufficiently Active (04/26/2022)   Exercise  Vital Sign    Days of Exercise per Week: 2 days    Minutes of Exercise per Session: 60 min  Stress: No Stress Concern Present (04/26/2022)   [employer] of Occupational Health - Occupational Stress Questionnaire    Feeling of Stress : Not at all  Social Connections: Unknown (08/01/2022)   Received from [facility]   Social Network    Social Network: Not on file  Intimate Partner Violence: Not At Risk (11/07/2022)   Humiliation, Afraid, Rape, and Kick questionnaire    Fear of Current or Ex-Partner: No    Emotionally Abused: No    Physically Abused: No    Sexually Abused: No      Review of Systems    All other ros negative  Objective:    BP 121/77   Pulse Marland Kitchen)  107   Resp 16   Ht 5\' 9"  (1.753 m)   Wt (!) 336 lb (152.4 kg)   LMP 01/31/2011   SpO2 98%   BMI 49.62 kg/m  Nursing note and vital signs reviewed.  Physical Exam     General/constitutional: no distress, pleasant HEENT: Normocephalic, PER, Conj Clear, EOMI, Oropharynx clear Neck supple CV: rrr no mrg Lungs: clear to auscultation, normal respiratory effort Abd: Soft, Nontender Ext: brawny edema bilateral legs; no tenderness or redness Skin: No Rash Neuro: nonfocal MSK: no peripheral joint swelling/tenderness/warmth; back spines nontender    Labs: Lab Results  Component Value Date   WBC 5.6 11/23/2022   HGB 9.8 (L) 11/23/2022   HCT 31.8 (L) 11/23/2022   MCV 91.9 11/23/2022   PLT 519 (H) 11/23/2022   Last metabolic panel Lab Results  Component Value Date   GLUCOSE 101 (H) 11/23/2022   NA 140 11/23/2022   K 4.0 11/23/2022   CL 107 11/23/2022   CO2 25 11/23/2022   BUN 5 (L) 11/23/2022   CREATININE 0.88 11/23/2022   EGFR 78 11/23/2022   CALCIUM 9.3 11/23/2022   PHOS 2.6 11/07/2022   PROT 7.6 11/23/2022   ALBUMIN 2.5 (L) 11/10/2022   LABGLOB 3.8 03/06/2022   AGRATIO 1.1 (L) 03/06/2022   BILITOT 0.7 11/23/2022   ALKPHOS 36 (L) 11/10/2022   AST 12 11/23/2022   ALT 11  11/23/2022   ANIONGAP 5 11/14/2022   Lab Results  Component Value Date   CRP 4.9 (H) 01/31/2017   Cdiff stool testing just collected today  Micro:  Serology:  Imaging:  Assessment & Plan:   Problem List Items Addressed This Visit   None Visit Diagnoses     Cellulitis of left lower extremity    -  Primary   Diarrhea, unspecified type             No orders of the defined types were placed in this encounter.    Cellulitis much improved almost resolved pain; no tenderness/erythema/fluctuance on exam  One more week of abx should be sufficient and she'll pick it up today and stopped cefadroxil   Diarrhea likely abx associated and not cdiff, but the latter testing is in progress today   Labs today  F/u 3-4 weeks   Follow-up: Return in about 4 weeks (around 12/27/2022).      Raymondo Band, MD [facility] 11/29/2022, 3:32 PM

## 2022-11-29 NOTE — Patient Instructions (Signed)
I think the diarrhea is due to abx rather than a different infection such as cdiff   But for now pending the testing, please avoid immodium   Start and finish 1 more week of zyvox today. Stop cefadroxil   See me in 3-4 more weeks

## 2022-11-30 ENCOUNTER — Inpatient Hospital Stay: Payer: Medicare HMO | Admitting: Internal Medicine

## 2022-11-30 ENCOUNTER — Other Ambulatory Visit: Payer: Self-pay | Admitting: Physician Assistant

## 2022-11-30 ENCOUNTER — Encounter: Payer: Medicare HMO | Admitting: Occupational Therapy

## 2022-11-30 DIAGNOSIS — Z86711 Personal history of pulmonary embolism: Secondary | ICD-10-CM

## 2022-11-30 LAB — COMPLETE METABOLIC PANEL WITH GFR
AG Ratio: 1 (calc) (ref 1.0–2.5)
ALT: 10 U/L (ref 6–29)
AST: 16 U/L (ref 10–35)
Albumin: 3.8 g/dL (ref 3.6–5.1)
Alkaline phosphatase (APISO): 59 U/L (ref 37–153)
BUN/Creatinine Ratio: 6 (calc) (ref 6–22)
BUN: 6 mg/dL — ABNORMAL LOW (ref 7–25)
CO2: 25 mmol/L (ref 20–32)
Calcium: 10 mg/dL (ref 8.6–10.4)
Chloride: 109 mmol/L (ref 98–110)
Creat: 0.93 mg/dL (ref 0.50–1.03)
Globulin: 4 g/dL — ABNORMAL HIGH (ref 1.9–3.7)
Glucose, Bld: 103 mg/dL — ABNORMAL HIGH (ref 65–99)
Potassium: 3.6 mmol/L (ref 3.5–5.3)
Sodium: 141 mmol/L (ref 135–146)
Total Bilirubin: 0.6 mg/dL (ref 0.2–1.2)
Total Protein: 7.8 g/dL (ref 6.1–8.1)
eGFR: 73 mL/min/{1.73_m2} (ref >=60)

## 2022-11-30 LAB — C. DIFFICILE GDH AND TOXIN A/B
GDH ANTIGEN: NOT DETECTED
MICRO NUMBER:: 15546569
SPECIMEN QUALITY:: ADEQUATE
TOXIN A AND B: NOT DETECTED

## 2022-11-30 LAB — CBC WITH DIFFERENTIAL/PLATELET
Absolute Monocytes: 610 {cells}/uL (ref 200–950)
Basophils Absolute: 39 {cells}/uL (ref 0–200)
Basophils Relative: 0.7 %
Eosinophils Absolute: 218 {cells}/uL (ref 15–500)
Eosinophils Relative: 3.9 %
HCT: 34.5 % — ABNORMAL LOW (ref 35.0–45.0)
Hemoglobin: 10.8 g/dL — ABNORMAL LOW (ref 11.7–15.5)
Lymphs Abs: 2134 {cells}/uL (ref 850–3900)
MCH: 28.5 pg (ref 27.0–33.0)
MCHC: 31.3 g/dL — ABNORMAL LOW (ref 32.0–36.0)
MCV: 91 fL (ref 80.0–100.0)
MPV: 9.8 fL (ref 7.5–12.5)
Monocytes Relative: 10.9 %
Neutro Abs: 2598 {cells}/uL (ref 1500–7800)
Neutrophils Relative %: 46.4 %
Platelets: 434 10*3/uL — ABNORMAL HIGH (ref 140–400)
RBC: 3.79 10*6/uL — ABNORMAL LOW (ref 3.80–5.10)
RDW: 14.4 % (ref 11.0–15.0)
Total Lymphocyte: 38.1 %
WBC: 5.6 10*3/uL (ref 3.8–10.8)

## 2022-11-30 NOTE — Progress Notes (Signed)
Cardiology Office Note:   Date:  12/01/2022  ID:  [name], DOB 01/30/1966, MRN [id number] PCP:  Claiborne Rigg, NP  [facility] Providers Cardiologist:  Alverda Skeans, MD Referring MD: Claiborne Rigg, NP   Chief Complaint/Reason for Referral: Cardiology follow-up ASSESSMENT:    1. Chest pain, unspecified type   2. History of pulmonary embolus (PE)   3. BMI 45.0-49.9, adult (HCC)   4. Diastolic dysfunction     PLAN:   In order of problems listed above: 1.  Chest pain: Has had a reassuring evaluation thus far with a negative coronary CTA. 2.  History of pulmonary embolism: Continue indefinite anticoagulation 3.  Elevated BMI: Hemoglobin A1c last year was within normal limits.  Continue dietary modification; consider bariatric surgery. 4.  Diastolic dysfunction: Has mild left ventricular hypertrophy on last echocardiogram; restart losartan 12.5mg  bedtime.        Dispo:  Return in about 1 year (around 12/01/2023).     Medication Adjustments/Labs and Tests Ordered: Current medicines are reviewed at length with the patient today.  Concerns regarding medicines are outlined above.  The following changes have been made:     Labs/tests ordered: No orders of the defined types were placed in this encounter.   Medication Changes: Meds ordered this encounter  Medications   losartan (COZAAR) 25 MG tablet    Sig: Take 0.5 tablets (12.5 mg total) by mouth at bedtime.    Dispense:  45 tablet    Refill:  4    Current medicines are reviewed at length with the patient today.  The patient does not have concerns regarding medicines.  History of Present Illness:      FOCUSED PROBLEM LIST:   Pulmonary embolism in 2018 with subsequent lower extremity DVT on indefinite Eliquis Anxiety Asthma Depression BMI of 49 Chest pain; coronary CTA with calcium score of 0 no obstructive coronary disease 2023 Lymphedema followed by wound clinic Rheumatoid arthritis   March 2023:  Patient seen for initial consultation for chest pain.  She was referred for coronary CTA and wound clinic evaluation due to lymphedema.   September 2023: The patient is doing very well.  She is lost 30 pounds by diet and exercise.  She feels relatively well.  She has had no recurrent chest pain or dyspnea.  She was hospitalized recently due to issues stemming from her lymphedema.  She is otherwise well without significant complaints today.  Plan: Start losartan given diastolic dysfunction and LVH.  Stop Imdur, Toprol, and as needed nitroglycerin.  October 2024: The patient was admitted to the hospital and September of this year for treatment of sepsis due to cellulitis.  Lower extremity Dopplers were negative.  Her blood pressure was stable however her losartan was stopped.  She was discharged on 3 weeks of antibiotics.  She was seen by infectious diseases earlier this week and was doing well.  She has no cardiovascular complaints today.  She is happy with being connected with lymphedema clinic and she is lost 5 L from her legs apparently with their program.  She had some issues with diarrhea and C. difficile was checked which was negative.  She has had no real cardiovascular complaints and has had no recurrent chest pain.       Current Medications: Current Meds  Medication Sig   albuterol (VENTOLIN HFA) 108 (90 Base) MCG/ACT inhaler Inhale 2 puffs into the lungs every 6 (six) hours as needed for wheezing or shortness of breath.  ELIQUIS 5 MG TABS tablet TAKE 1 TABLET TWICE A DAY   ergocalciferol (VITAMIN D2) 1.25 MG (50000 UT) capsule 1 capsule Orally q week for 30 days   ezetimibe (ZETIA) 10 MG tablet Take 1 tablet (10 mg total) by mouth daily. For high cholesterol   famotidine (PEPCID) 40 MG tablet Take 1 tablet (40 mg total) by mouth daily.   folic acid (FOLVITE) 1 MG tablet Take 1 tablet by mouth once daily   linezolid (ZYVOX) 600 MG tablet Take 1 tablet (600 mg total) by mouth 2 (two) times  daily for 14 days.   loperamide (IMODIUM A-D) 2 MG tablet Take 2 mg by mouth 4 (four) times daily as needed for diarrhea or loose stools.   losartan (COZAAR) 25 MG tablet Take 0.5 tablets (12.5 mg total) by mouth at bedtime.   meclizine (ANTIVERT) 12.5 MG tablet Take 1-2 tablets (12.5-25 mg total) by mouth 3 (three) times daily as needed for dizziness.    Review of Systems:   Please see the history of present illness.    All other systems reviewed and are negative.   EKGs/Labs/Other Test Reviewed:   EKG: EKG done September 2024 that I personally reviewed demonstrates sinus rhythm  EKG Interpretation Date/Time:    Ventricular Rate:    PR Interval:    QRS Duration:    QT Interval:    QTC Calculation:   R Axis:      Text Interpretation:           Risk Assessment/Calculations:          Physical Exam:   VS:  BP 110/72   Pulse 92   Ht 6' (1.829 m)   Wt (!) 316 lb (143.3 kg)   LMP 01/31/2011   BMI 42.86 kg/m        Wt Readings from Last 3 Encounters:  12/01/22 (!) 316 lb (143.3 kg)  11/29/22 (!) 336 lb (152.4 kg)  11/06/22 (!) 336 lb (152.4 kg)      GENERAL:  No apparent distress, AOx3 HEENT:  No carotid bruits, +2 carotid impulses, no scleral icterus CAR: RRR no murmurs, gallops, rubs, or thrills RES:  Clear to auscultation bilaterally ABD:  Soft, nontender, nondistended, positive bowel sounds x 4 VASC:  +2 radial pulses, +2 carotid pulses NEURO:  CN 2-12 grossly intact; motor and sensory grossly intact PSYCH:  No active depression or anxiety EXT:  No edema, ecchymosis, or cyanosis  Signed, Orbie Pyo, MD  12/01/2022 2:45 PM    [facility] [address] [place], [place], [place]  [address] Phone: [phone]; Fax: [phone]   Note:  This document was prepared using Dragon voice recognition software and may include unintentional dictation errors.

## 2022-11-30 NOTE — Telephone Encounter (Signed)
Requested medication (s) are due for refill today: yes   Requested medication (s) are on the active medication list: yes   Last refill:  07/07/22 #90 3 refills  Future visit scheduled: yes in 6 days   Notes to clinic:  protocol failed last lab platelets 434 (H) on 11/29/22 do you want to refill Rx?     Requested Prescriptions  Pending Prescriptions Disp Refills   ELIQUIS 5 MG TABS tablet [Pharmacy Med Name: ELIQUIS TAB 5MG ] 180 tablet 3    Sig: TAKE 1 TABLET TWICE A DAY     Hematology:  Anticoagulants - apixaban Failed - 11/30/2022  2:06 AM      Failed - PLT in normal range and within 360 days    Platelets  Date Value Ref Range Status  11/29/2022 434 (H) 140 - 400 Thousand/uL Final  03/06/2022 277 150 - 450 x10E3/uL Final   Platelet Count, POC  Date Value Ref Range Status  01/24/2013 317 142 - 424 K/uL Final         Failed - HGB in normal range and within 360 days    Hemoglobin  Date Value Ref Range Status  11/29/2022 10.8 (L) 11.7 - 15.5 g/dL Final  16/11/9602 54.0 11.1 - 15.9 g/dL Final         Failed - HCT in normal range and within 360 days    HCT  Date Value Ref Range Status  11/29/2022 34.5 (L) 35.0 - 45.0 % Final   Hematocrit  Date Value Ref Range Status  03/06/2022 35.8 34.0 - 46.6 % Final         Passed - Cr in normal range and within 360 days    Creat  Date Value Ref Range Status  11/29/2022 0.93 0.50 - 1.03 mg/dL Final         Passed - AST in normal range and within 360 days    AST  Date Value Ref Range Status  11/29/2022 16 10 - 35 U/L Final  05/13/2018 11 (L) 15 - 41 U/L Final         Passed - ALT in normal range and within 360 days    ALT  Date Value Ref Range Status  11/29/2022 10 6 - 29 U/L Final  05/13/2018 8 0 - 44 U/L Final         Passed - Valid encounter within last 12 months    Recent Outpatient Visits           1 month ago Cellulitis of right upper extremity   [place] [facility] [place], Shea Stakes,  NP   4 months ago Primary hypertension   [place] [facility] [place], Shea Stakes, NP   8 months ago Primary hypertension   [place] [facility] [place], Shea Stakes, NP   1 year ago Primary hypertension   [place] [facility] [place], Shea Stakes, NP   1 year ago Nausea   [facility] [place], [place], [place]       Future Appointments             Tomorrow Lynnette Caffey, Charlies Constable, MD [facility]   In 6 days Claiborne Rigg, NP [facility]   In 1 month Vu, Tonita Phoenix, MD [facility]   In 1 month [place],  Shea Stakes, NP [facility]

## 2022-12-01 ENCOUNTER — Encounter: Payer: Self-pay | Admitting: Internal Medicine

## 2022-12-01 ENCOUNTER — Ambulatory Visit: Payer: Medicare HMO | Attending: Internal Medicine | Admitting: Internal Medicine

## 2022-12-01 VITALS — BP 110/72 | HR 92 | Ht 72.0 in | Wt 316.0 lb

## 2022-12-01 DIAGNOSIS — R079 Chest pain, unspecified: Secondary | ICD-10-CM

## 2022-12-01 DIAGNOSIS — Z6841 Body Mass Index (BMI) 40.0 and over, adult: Secondary | ICD-10-CM | POA: Diagnosis not present

## 2022-12-01 DIAGNOSIS — Z86711 Personal history of pulmonary embolism: Secondary | ICD-10-CM

## 2022-12-01 DIAGNOSIS — I5189 Other ill-defined heart diseases: Secondary | ICD-10-CM | POA: Diagnosis not present

## 2022-12-01 MED ORDER — LOSARTAN POTASSIUM 25 MG PO TABS: 12.5000 mg | ORAL_TABLET | Freq: Every day | ORAL | 4 refills | Status: DC

## 2022-12-01 NOTE — Patient Instructions (Signed)
Medication Instructions:   RESTART TAKING LOSARTAN 12.5 MG BY MOUTH DAILY AT BEDTIME  *If you need a refill on your cardiac medications before your next appointment, please call your pharmacy*     Follow-Up: At [facility], you and your health needs are our priority.  As part of our continuing mission to provide you with exceptional heart care, we have created designated Provider Care Teams.  These Care Teams include your primary Cardiologist (physician) and Advanced Practice Providers (APPs -  Physician Assistants and Nurse Practitioners) who all work together to provide you with the care you need, when you need it.  We recommend signing up for the patient portal called "MyChart".  Sign up information is provided on this After Visit Summary.  MyChart is used to connect with patients for Virtual Visits (Telemedicine).  Patients are able to view lab/test results, encounter notes, upcoming appointments, etc.  Non-urgent messages can be sent to your provider as well.   To learn more about what you can do with MyChart, go to [email or web address].    Your next appointment:   1 year(s)  Provider:   Jari Favre, PA-C, Ronie Spies, PA-C, Robin Searing, NP, Jacolyn Reedy, PA-C, Eligha Bridegroom, NP, Tereso Newcomer, PA-C, or Perlie Gold, PA-C

## 2022-12-04 ENCOUNTER — Encounter: Payer: Medicare HMO | Admitting: Occupational Therapy

## 2022-12-05 ENCOUNTER — Encounter: Payer: Medicare HMO | Admitting: Occupational Therapy

## 2022-12-06 ENCOUNTER — Ambulatory Visit: Payer: Medicare HMO | Attending: Nurse Practitioner | Admitting: Nurse Practitioner

## 2022-12-06 ENCOUNTER — Encounter: Payer: Medicare HMO | Admitting: Occupational Therapy

## 2022-12-06 ENCOUNTER — Encounter: Payer: Self-pay | Admitting: Nurse Practitioner

## 2022-12-06 VITALS — BP 115/72 | HR 98 | Ht 72.0 in | Wt 324.0 lb

## 2022-12-06 DIAGNOSIS — Z86711 Personal history of pulmonary embolism: Secondary | ICD-10-CM | POA: Diagnosis not present

## 2022-12-06 DIAGNOSIS — Z09 Encounter for follow-up examination after completed treatment for conditions other than malignant neoplasm: Secondary | ICD-10-CM | POA: Diagnosis not present

## 2022-12-06 DIAGNOSIS — L03115 Cellulitis of right lower limb: Secondary | ICD-10-CM

## 2022-12-06 DIAGNOSIS — Z6841 Body Mass Index (BMI) 40.0 and over, adult: Secondary | ICD-10-CM | POA: Diagnosis not present

## 2022-12-06 DIAGNOSIS — L03116 Cellulitis of left lower limb: Secondary | ICD-10-CM

## 2022-12-06 DIAGNOSIS — L0390 Cellulitis, unspecified: Secondary | ICD-10-CM | POA: Insufficient documentation

## 2022-12-06 DIAGNOSIS — I890 Lymphedema, not elsewhere classified: Secondary | ICD-10-CM | POA: Diagnosis not present

## 2022-12-06 MED ORDER — APIXABAN 5 MG PO TABS
5.0000 mg | ORAL_TABLET | Freq: Two times a day (BID) | ORAL | 3 refills | Status: DC
Start: 2022-12-06 — End: 2023-06-13

## 2022-12-06 NOTE — Assessment & Plan Note (Deleted)
Hospital follow up for cellulitis. Patient discharged on Duricef but stopped taking medication d/t diarrhea. Linezolid prescribed per ID but pt has not yet picked up medication from pharmacy, Patient says she will pick it up today.

## 2022-12-06 NOTE — Assessment & Plan Note (Signed)
Patient no longer seen at [facility]. She has moved to location closer to her home. Patient says she was told not to resume OP therapy until her cellulitis had cleared.

## 2022-12-06 NOTE — Progress Notes (Signed)
Assessment & Plan:  [name] was seen today for hospitalization follow-up.  Diagnoses and all orders for this visit:  Lymphedema -     DME Bedside commode  Hospital discharge follow-up Keep all f/u appts with lymphedema clinic  Morbid obesity with BMI of 40.0-44.9, adult (HCC) -     DME Bedside commode  History of pulmonary embolus (PE) -     apixaban (ELIQUIS) 5 MG TABS tablet; Take 1 tablet (5 mg total) by mouth 2 (two) times daily.    Patient has been counseled on age-appropriate routine health concerns for screening and prevention. These are reviewed and up-to-date. Referrals have been placed accordingly. Immunizations are up-to-date or declined.    Subjective:   Chief Complaint  Patient presents with   Hospitalization Follow-up   [name] presents today for hospital follow up for LLE cellulitis. Patient was discharged on Duricef but stopped taking it due to experiencing diarrhea. Linezolid was prescribed Per ID on 11/22/22. Patient says she has to pick the medication up today because her daughter has been out of town. She says she now has lymphedema treatment in [place]. She did not go to her last scheduled appt because she was told "not to restart therapy until her infection had cleared up."[name] would like refills of Eliquis to be sent at [facility]. She has no other concerns today.  Due to her significant lymphedema she requires a bariatric bedside toilet. It is very difficult to mobilize to bathroom quickly if needed especially at night or early morning.    Review of Systems  Constitutional: Negative.  Negative for fever, malaise/fatigue and weight loss.  HENT: Negative.  Negative for nosebleeds.   Eyes: Negative.  Negative for blurred vision, double vision and photophobia.  Respiratory: Negative.  Negative for cough and shortness of breath.   Cardiovascular:  Positive for leg swelling (chronic but improved). Negative for chest pain and  palpitations.  Gastrointestinal: Negative.  Negative for heartburn, nausea and vomiting.  Musculoskeletal: Negative.  Negative for myalgias.  Skin:        "Legs feel better"  Neurological: Negative.  Negative for dizziness, focal weakness, seizures and headaches.  Psychiatric/Behavioral: Negative.  Negative for suicidal ideas.     Past Medical History:  Diagnosis Date   Anemia    Anxiety    Arthritis    Asthma    hx as child - no prob as adult - no inhaler   Blood transfusion 01/22/11   transfusion 2 units at [facility]   Depression 08/2010   psych assessment   Dyspnea    Fibroid    Headache(784.0)    rx for imitrex - last one jan   Keloid    Lymphedema    Pulmonary embolism [facility])     Past Surgical History:  Procedure Laterality Date   ABDOMINAL HYSTERECTOMY     COLONOSCOPY N/A 11/08/2017   Procedure: COLONOSCOPY;  Surgeon: West Bali, MD;  Location: [facility];  Service: Endoscopy;  Laterality: N/A;  2:00pm   FLEXIBLE SIGMOIDOSCOPY N/A 09/17/2017   Procedure: FLEXIBLE SIGMOIDOSCOPY;  Surgeon: West Bali, MD;  Location: [facility];  Service: Endoscopy;  Laterality: N/A;   HERNIA REPAIR  2009   umbilical   POLYPECTOMY  11/08/2017   Procedure: POLYPECTOMY;  Surgeon: West Bali, MD;  Location: [facility];  Service: Endoscopy;;  ascending colon (CSx1), transverse colon (CS x1), splenic flexure (HSx1)   SVD     Spontaneous vaginal delivery;  x 1    Family History  Problem Relation Age of Onset   Heart disease Mother    Hypertension Mother    Hypertension Father    Colon cancer Father 36       Passed away 40 yrs old   Hypertension Sister    Cancer Maternal Grandmother        gastric cancer   Cancer Maternal Grandfather        pancreatic cancer   Colon cancer Paternal Grandmother    Colon cancer Paternal Uncle    Colon polyps Neg Hx     Social History Reviewed with no changes to be made today.   Outpatient Medications Prior to Visit  Medication Sig  Dispense Refill   albuterol (VENTOLIN HFA) 108 (90 Base) MCG/ACT inhaler Inhale 2 puffs into the lungs every 6 (six) hours as needed for wheezing or shortness of breath. 18 g 2   ergocalciferol (VITAMIN D2) 1.25 MG (50000 UT) capsule 1 capsule Orally q week for 30 days     ezetimibe (ZETIA) 10 MG tablet Take 1 tablet (10 mg total) by mouth daily. For high cholesterol 90 tablet 3   famotidine (PEPCID) 40 MG tablet Take 1 tablet (40 mg total) by mouth daily. 90 tablet 1   folic acid (FOLVITE) 1 MG tablet Take 1 tablet by mouth once daily 90 tablet 1   linezolid (ZYVOX) 600 MG tablet Take 1 tablet (600 mg total) by mouth 2 (two) times daily for 14 days. 28 tablet 0   losartan (COZAAR) 25 MG tablet Take 0.5 tablets (12.5 mg total) by mouth at bedtime. 45 tablet 4   meclizine (ANTIVERT) 12.5 MG tablet Take 1-2 tablets (12.5-25 mg total) by mouth 3 (three) times daily as needed for dizziness. 60 tablet 0   Misc. Devices (BARIATRIC ROLLATOR) MISC Please provide patient with insurance approved bariatric rollator walker. I89.0, R53.81, R26.81 1 each 0   ELIQUIS 5 MG TABS tablet TAKE 1 TABLET TWICE A DAY 180 tablet 3   loperamide (IMODIUM A-D) 2 MG tablet Take 2 mg by mouth 4 (four) times daily as needed for diarrhea or loose stools. (Patient not taking: Reported on 12/06/2022)     cefadroxil (DURICEF) 500 MG capsule Take 2 capsules (1,000 mg total) by mouth 2 (two) times daily for 21 days. (Patient not taking: Reported on 12/01/2022) 84 capsule 0   No facility-administered medications prior to visit.    Allergies  Allergen Reactions   Abatacept Anaphylaxis    Other reaction(s): Anaphylaxis-Streptomycin Clickject Other reaction(s): Anaphylaxis-Streptomycin   Bee Venom Anaphylaxis   Cefadroxil Diarrhea   Augmentin [Amoxicillin-Pot Clavulanate] Hives, Itching and Other (See Comments)    Did PCN reaction causing immediate rash, facial/tongue/throat swelling, SOB or lightheadedness with hypotension:  yes Did PCN reaction causing severe rash involving mucus membranes or skin necrosis: no Has patient had a PCN reaction that required hospitalization: in hospital Has patient had a PCN reaction occurring within the last 10 years: no If all of the above answers are "NO", then may proceed with Cephalosporin use.  Pt reports no recollection of any reactions when taking penicillin in past   Latex Hives and Other (See Comments)    Local reaction (welts). Patient denies any wheezing or other reaction with latex exposure   Nulytely [Peg 3350-Kcl-Na Bicarb-Nacl]     NAUSEA AND VOMITING. MAY TOLERATE LOW VOLUME PREP.       Objective:    BP 115/72 (BP Location: Left Arm, Patient Position: Sitting,  Cuff Size: Normal)   Pulse 98   Ht 6' (1.829 m)   Wt (!) 324 lb (147 kg)   LMP 01/31/2011   SpO2 94%   BMI 43.94 kg/m  Wt Readings from Last 3 Encounters:  12/06/22 (!) 324 lb (147 kg)  12/01/22 (!) 316 lb (143.3 kg)  11/29/22 (!) 336 lb (152.4 kg)    Physical Exam Vitals and nursing note reviewed.  Constitutional:      Appearance: She is well-developed.  HENT:     Head: Normocephalic and atraumatic.  Cardiovascular:     Rate and Rhythm: Normal rate and regular rhythm.     Heart sounds: Normal heart sounds. No murmur heard.    No friction rub. No gallop.  Pulmonary:     Effort: Pulmonary effort is normal. No tachypnea or respiratory distress.     Breath sounds: Normal breath sounds. No decreased breath sounds, wheezing, rhonchi or rales.  Chest:     Chest wall: No tenderness.  Abdominal:     General: Bowel sounds are normal.     Palpations: Abdomen is soft.  Musculoskeletal:        General: Normal range of motion.     Cervical back: Normal range of motion.     Right lower leg: Swelling (chronic) present. No edema.     Left lower leg: Swelling (chronic) present. No edema.  Skin:    General: Skin is warm and dry.  Neurological:     Mental Status: She is alert and oriented to  person, place, and time.     Coordination: Coordination normal.  Psychiatric:        Behavior: Behavior normal. Behavior is cooperative.        Thought Content: Thought content normal.        Judgment: Judgment normal.          Patient has been counseled extensively about nutrition and exercise as well as the importance of adherence with medications and regular follow-up. The patient was given clear instructions to go to ER or return to medical center if symptoms don't improve, worsen or new problems develop. The patient verbalized understanding.   Follow-up: Return in about 3 months (around 03/08/2023), or if symptoms worsen or fail to improve.    I have seen and examined this patient with the advanced practice provider student Burnard Bunting and agree with the above note    Claiborne Rigg, FNP-BC [facility] [place], [place] [phone]   12/18/2022, 8:06 AM

## 2022-12-07 ENCOUNTER — Encounter: Payer: Medicare HMO | Admitting: Occupational Therapy

## 2022-12-08 ENCOUNTER — Ambulatory Visit: Payer: Self-pay

## 2022-12-08 NOTE — Patient Outreach (Signed)
Care Coordination   Follow Up Visit Note   12/08/2022 Name: SINDI DUDDY MRN: [id number] DOB: 1965/04/12  TIONNI BAAB is a 57 y.o. year old female who sees Claiborne Rigg, NP for primary care. I spoke with  [name] by phone today.  What matters to the patients health and wellness today?  Patient would like to resume her lymphedema therapy for ongoing management as directed.     Goals Addressed             This Visit's Progress    To better manage lymphedema to bilateral legs/feet   On track    Care Coordination Interventions: Evaluation of current treatment plan related to cellulitis secondary to lymphedema and patient's adherence to plan as established by provider Determined patient completed a follow up with Infectious Disease for evaluation of her diarrhea Determined patient was negative for C-diff, her antibiotic was changed due to having a drug intolerance to the Cefadroxil Discussed patient's cellulitis has nearly resolved and she plans to return to the lymphedema clinic on 12/12/22 as scheduled Determined patient will receive her new waist high lymphedema pump next week  Instructed patient to contact her doctor with new symptoms or concerns promptly      Interventions Today    Flowsheet Row Most Recent Value  Chronic Disease   Chronic disease during today's visit Other  [diarrhea,  lymphedema,  cellulitis]  General Interventions   General Interventions Discussed/Reviewed General Interventions Discussed, General Interventions Reviewed, Doctor Visits, Durable Medical Equipment (DME)  Doctor Visits Discussed/Reviewed Doctor Visits Discussed, Doctor Visits Reviewed, Specialist  Durable Medical Equipment (DME) Bed side commode  Education Interventions   Education Provided Provided Education  Provided Verbal Education On When to see the doctor, Medication  Pharmacy Interventions   Pharmacy Dicussed/Reviewed Pharmacy Topics Discussed, Pharmacy Topics  Reviewed, Medications and their functions          SDOH assessments and interventions completed:  No     Care Coordination Interventions:  Yes, provided   Follow up plan: Follow up call scheduled for 01/17/23 @11 :00 AM    Encounter Outcome:  Patient Visit Completed

## 2022-12-08 NOTE — Patient Instructions (Signed)
Visit Information  Thank you for taking time to visit with me today. Please don't hesitate to contact me if I can be of assistance to you.   Following are the goals we discussed today:   Goals Addressed             This Visit's Progress    To better manage lymphedema to bilateral legs/feet   On track    Care Coordination Interventions: Evaluation of current treatment plan related to cellulitis secondary to lymphedema and patient's adherence to plan as established by provider Determined patient completed a follow up with Infectious Disease for evaluation of her diarrhea Determined patient was negative for C-diff, her antibiotic was changed due to having a drug intolerance to the Cefadroxil Discussed patient's cellulitis has nearly resolved and she plans to return to the lymphedema clinic on 12/12/22 as scheduled Determined patient will receive her new waist high lymphedema pump next week  Instructed patient to contact her doctor with new symptoms or concerns promptly         Our next appointment is by telephone on 01/17/23 at 11:00 AM  Please call the care guide team at [phone] if you need to cancel or reschedule your appointment.   If you are experiencing a Mental Health or Behavioral Health Crisis or need someone to talk to, please call 1-800-273-TALK (toll free, 24 hour hotline)  Patient verbalizes understanding of instructions and care plan provided today and agrees to view in MyChart. Active MyChart status and patient understanding of how to access instructions and care plan via MyChart confirmed with patient.     Delsa Sale RN BSN CCM [place]  [facility], [facility] Nurse Care Coordinator  Direct Dial: [phone] Website: [email or web address]

## 2022-12-11 ENCOUNTER — Encounter: Payer: Medicare HMO | Admitting: Occupational Therapy

## 2022-12-12 ENCOUNTER — Encounter: Payer: Medicare HMO | Admitting: Occupational Therapy

## 2022-12-12 ENCOUNTER — Ambulatory Visit: Payer: Medicare HMO | Admitting: Podiatry

## 2022-12-13 ENCOUNTER — Encounter: Payer: Medicare HMO | Admitting: Occupational Therapy

## 2022-12-14 ENCOUNTER — Encounter: Payer: Medicare HMO | Admitting: Occupational Therapy

## 2022-12-14 DIAGNOSIS — R7303 Prediabetes: Secondary | ICD-10-CM | POA: Diagnosis not present

## 2022-12-14 DIAGNOSIS — F32A Depression, unspecified: Secondary | ICD-10-CM | POA: Diagnosis not present

## 2022-12-14 DIAGNOSIS — E785 Hyperlipidemia, unspecified: Secondary | ICD-10-CM | POA: Diagnosis not present

## 2022-12-14 DIAGNOSIS — M1990 Unspecified osteoarthritis, unspecified site: Secondary | ICD-10-CM | POA: Diagnosis not present

## 2022-12-14 DIAGNOSIS — J45909 Unspecified asthma, uncomplicated: Secondary | ICD-10-CM | POA: Diagnosis not present

## 2022-12-14 DIAGNOSIS — N3946 Mixed incontinence: Secondary | ICD-10-CM | POA: Diagnosis not present

## 2022-12-14 DIAGNOSIS — K5900 Constipation, unspecified: Secondary | ICD-10-CM | POA: Diagnosis not present

## 2022-12-14 DIAGNOSIS — K219 Gastro-esophageal reflux disease without esophagitis: Secondary | ICD-10-CM | POA: Diagnosis not present

## 2022-12-14 DIAGNOSIS — F419 Anxiety disorder, unspecified: Secondary | ICD-10-CM | POA: Diagnosis not present

## 2022-12-14 DIAGNOSIS — E559 Vitamin D deficiency, unspecified: Secondary | ICD-10-CM | POA: Diagnosis not present

## 2022-12-14 DIAGNOSIS — I87309 Chronic venous hypertension (idiopathic) without complications of unspecified lower extremity: Secondary | ICD-10-CM | POA: Diagnosis not present

## 2022-12-18 ENCOUNTER — Encounter: Payer: Medicare HMO | Admitting: Occupational Therapy

## 2022-12-19 ENCOUNTER — Encounter: Payer: Medicare HMO | Admitting: Occupational Therapy

## 2022-12-19 DIAGNOSIS — L89152 Pressure ulcer of sacral region, stage 2: Secondary | ICD-10-CM | POA: Diagnosis not present

## 2022-12-19 DIAGNOSIS — I890 Lymphedema, not elsewhere classified: Secondary | ICD-10-CM | POA: Diagnosis not present

## 2022-12-19 DIAGNOSIS — J181 Lobar pneumonia, unspecified organism: Secondary | ICD-10-CM | POA: Diagnosis not present

## 2022-12-19 DIAGNOSIS — D649 Anemia, unspecified: Secondary | ICD-10-CM | POA: Diagnosis not present

## 2022-12-19 DIAGNOSIS — I2699 Other pulmonary embolism without acute cor pulmonale: Secondary | ICD-10-CM | POA: Diagnosis not present

## 2022-12-20 ENCOUNTER — Encounter: Payer: Medicare HMO | Admitting: Occupational Therapy

## 2022-12-21 ENCOUNTER — Encounter: Payer: Medicare HMO | Admitting: Occupational Therapy

## 2022-12-25 ENCOUNTER — Encounter: Payer: Medicare HMO | Admitting: Occupational Therapy

## 2022-12-26 ENCOUNTER — Encounter: Payer: Medicare HMO | Admitting: Occupational Therapy

## 2022-12-27 ENCOUNTER — Encounter: Payer: Medicare HMO | Admitting: Occupational Therapy

## 2022-12-28 ENCOUNTER — Encounter: Payer: Medicare HMO | Admitting: Occupational Therapy

## 2023-01-01 ENCOUNTER — Encounter: Payer: Medicare HMO | Admitting: Occupational Therapy

## 2023-01-02 ENCOUNTER — Encounter: Payer: Medicare HMO | Admitting: Occupational Therapy

## 2023-01-02 DIAGNOSIS — R5382 Chronic fatigue, unspecified: Secondary | ICD-10-CM | POA: Diagnosis not present

## 2023-01-02 DIAGNOSIS — E559 Vitamin D deficiency, unspecified: Secondary | ICD-10-CM | POA: Diagnosis not present

## 2023-01-02 DIAGNOSIS — M0579 Rheumatoid arthritis with rheumatoid factor of multiple sites without organ or systems involvement: Secondary | ICD-10-CM | POA: Diagnosis not present

## 2023-01-02 DIAGNOSIS — L0390 Cellulitis, unspecified: Secondary | ICD-10-CM | POA: Diagnosis not present

## 2023-01-02 DIAGNOSIS — I890 Lymphedema, not elsewhere classified: Secondary | ICD-10-CM | POA: Diagnosis not present

## 2023-01-02 DIAGNOSIS — Z6841 Body Mass Index (BMI) 40.0 and over, adult: Secondary | ICD-10-CM | POA: Diagnosis not present

## 2023-01-03 ENCOUNTER — Ambulatory Visit: Payer: Medicare HMO | Admitting: Internal Medicine

## 2023-01-03 ENCOUNTER — Encounter: Payer: Medicare HMO | Admitting: Occupational Therapy

## 2023-01-04 ENCOUNTER — Other Ambulatory Visit: Payer: Self-pay

## 2023-01-04 ENCOUNTER — Encounter: Payer: Self-pay | Admitting: Internal Medicine

## 2023-01-04 ENCOUNTER — Ambulatory Visit: Payer: Medicare HMO | Admitting: Internal Medicine

## 2023-01-04 ENCOUNTER — Encounter: Payer: Medicare HMO | Admitting: Occupational Therapy

## 2023-01-04 VITALS — Ht 72.0 in | Wt 321.0 lb

## 2023-01-04 DIAGNOSIS — I878 Other specified disorders of veins: Secondary | ICD-10-CM | POA: Diagnosis not present

## 2023-01-04 MED ORDER — TRIAMCINOLONE ACETONIDE 0.5 % EX OINT: 1.0000 | TOPICAL_OINTMENT | Freq: Two times a day (BID) | CUTANEOUS | 5 refills | Status: DC

## 2023-01-04 NOTE — Progress Notes (Signed)
[facility]  Patient Active Problem List   Diagnosis Date Noted   Cellulitis 12/06/2022   AKI (acute kidney injury) (HCC) 11/07/2022   Murmur, cardiac 11/07/2022   Essential hypertension 11/07/2022   Elevated bilirubin 11/07/2022   Rheumatoid arthritis (HCC) 03/06/2022   Lymphedema 03/06/2022   Morbid obesity (HCC) 09/14/2021   Chest pain 04/07/2021   Screening for malignant neoplasm of colon 03/09/2021   Constipation 03/09/2021   Iron deficiency anemia 03/09/2021   Body mass index (BMI) 45.0-49.9, adult (HCC) 08/20/2020   Chronic fatigue syndrome 08/20/2020   Polyarthralgia 08/20/2020   Vitamin D deficiency 08/20/2020   Sepsis due to cellulitis (HCC) 12/31/2018   Elephantiasis 12/31/2018   Ischemic chest pain (HCC) 12/31/2018   History of pulmonary embolus (PE) 12/31/2018   Physical deconditioning 08/27/2018   Adenopathy 09/15/2017   Family history of colon cancer 08/01/2017   Taking multiple medications for chronic disease 08/01/2017   Axillary lymphadenopathy 01/26/2017   Urinary incontinence 01/25/2017   Hypokalemia 01/25/2017   Lobar pneumonia (HCC) 01/25/2017   Pressure ulcer of sacral region, stage 2 (HCC) 01/25/2017   Abnormal ECG 01/29/2013   Dyspnea    Menorrhagia with regular cycle 02/22/2011   Anemia 02/22/2011      Subjective:    Patient ID: [name], female    DOB: October 19, 1965, 57 y.o.   MRN: [id number]  No chief complaint on file.   HPI:  XARA PAULDING is a 57 y.o. female lymphedema, left leg cellulitis, here for hospital f/u   Patient was discharged on planned 3 weeks cefadroxil but started developing watery diarrhea a week prior to this visit 11/29/22 We have rx'ed zyvox but she haven't picked it up yet.   No n/v/f/c/abd pain  Diarrhea would be at most 3-4 times a day. She also uses immodium  Labs done a week prior showed no leukocytosis, or aki   She still takes cefadroxil 1000 mg twice  a day. Legs pain almost gone 80%. Redness not there any more    01/04/23 id clinic f/u See a&p   Allergies  Allergen Reactions   Abatacept Anaphylaxis    Other reaction(s): Anaphylaxis-Streptomycin Clickject Other reaction(s): Anaphylaxis-Streptomycin   Bee Venom Anaphylaxis   Cefadroxil Diarrhea   Augmentin [Amoxicillin-Pot Clavulanate] Hives, Itching and Other (See Comments)    Did PCN reaction causing immediate rash, facial/tongue/throat swelling, SOB or lightheadedness with hypotension: yes Did PCN reaction causing severe rash involving mucus membranes or skin necrosis: no Has patient had a PCN reaction that required hospitalization: in hospital Has patient had a PCN reaction occurring within the last 10 years: no If all of the above answers are "NO", then may proceed with Cephalosporin use.  Pt reports no recollection of any reactions when taking penicillin in past   Latex Hives and Other (See Comments)    Local reaction (welts). Patient denies any wheezing or other reaction with latex exposure   Nulytely [Peg 3350-Kcl-Na Bicarb-Nacl]     NAUSEA AND VOMITING. MAY TOLERATE LOW VOLUME PREP.      Outpatient Medications Prior to Visit  Medication Sig Dispense Refill   albuterol (VENTOLIN HFA) 108 (90 Base) MCG/ACT inhaler Inhale 2 puffs into the lungs every 6 (six) hours as needed for wheezing or shortness of breath. 18 g 2   apixaban (ELIQUIS) 5 MG TABS tablet Take 1 tablet (5 mg total) by mouth 2 (two) times daily. 180 tablet 3   ergocalciferol (VITAMIN  D2) 1.25 MG (50000 UT) capsule 1 capsule Orally q week for 30 days     ezetimibe (ZETIA) 10 MG tablet Take 1 tablet (10 mg total) by mouth daily. For high cholesterol 90 tablet 3   famotidine (PEPCID) 40 MG tablet Take 1 tablet (40 mg total) by mouth daily. 90 tablet 1   folic acid (FOLVITE) 1 MG tablet Take 1 tablet by mouth once daily 90 tablet 1   loperamide (IMODIUM A-D) 2 MG tablet Take 2 mg by mouth 4 (four) times daily  as needed for diarrhea or loose stools. (Patient not taking: Reported on 12/06/2022)     losartan (COZAAR) 25 MG tablet Take 0.5 tablets (12.5 mg total) by mouth at bedtime. 45 tablet 4   meclizine (ANTIVERT) 12.5 MG tablet Take 1-2 tablets (12.5-25 mg total) by mouth 3 (three) times daily as needed for dizziness. 60 tablet 0   Misc. Devices (BARIATRIC ROLLATOR) MISC Please provide patient with insurance approved bariatric rollator walker. I89.0, R53.81, R26.81 1 each 0   No facility-administered medications prior to visit.     Social History   Socioeconomic History   Marital status: Single    Spouse name: Not on file   Number of children: 1   Years of education: Not on file   Highest education level: Not on file  Occupational History   Occupation: medical billing    Employer: [employer]  Tobacco Use   Smoking status: Never   Smokeless tobacco: Never  Vaping Use   Vaping status: Never Used  Substance and Sexual Activity   Alcohol use: No   Drug use: No   Sexual activity: Not Currently    Birth control/protection: None, Surgical  Other Topics Concern   Not on file  Social History Narrative   Not on file   Social Determinants of Health   Financial Resource Strain: Medium Risk (12/06/2022)   Overall Financial Resource Strain (CARDIA)    Difficulty of Paying Living Expenses: Somewhat hard  Food Insecurity: No Food Insecurity (11/17/2022)   Hunger Vital Sign    Worried About Running Out of Food in the Last Year: Never true    Ran Out of Food in the Last Year: Never true  Transportation Needs: No Transportation Needs (11/17/2022)   PRAPARE - Administrator, Civil Service (Medical): No    Lack of Transportation (Non-Medical): No  Physical Activity: Insufficiently Active (12/06/2022)   Exercise Vital Sign    Days of Exercise per Week: 3 days    Minutes of Exercise per Session: 30 min  Stress: No Stress Concern Present (12/06/2022)   [employer] of Occupational Health  - Occupational Stress Questionnaire    Feeling of Stress : Not at all  Social Connections: Moderately Integrated (12/06/2022)   Social Connection and Isolation Panel [NHANES]    Frequency of Communication with Friends and Family: More than three times a week    Frequency of Social Gatherings with Friends and Family: Twice a week    Attends Religious Services: 1 to 4 times per year    Active Member of [employer] or Organizations: Yes    Attends Banker Meetings: 1 to 4 times per year    Marital Status: Never married  Intimate Partner Violence: Not At Risk (11/07/2022)   Humiliation, Afraid, Rape, and Kick questionnaire    Fear of Current or Ex-Partner: No    Emotionally Abused: No    Physically Abused: No    Sexually Abused: No  Review of Systems    All other ros negative  Objective:    LMP 01/31/2011  Nursing note and vital signs reviewed.  Physical Exam     General/constitutional: no distress, pleasant HEENT: Normocephalic, PER, Conj Clear, EOMI, Oropharynx clear Neck supple CV: rrr no mrg Lungs: clear to auscultation, normal respiratory effort Abd: Soft, Nontender Ext: brawny edema bilateral legs; no tenderness or redness Skin: No Rash -- chronic brawny bilateral lymphedema changes bilateral LE Neuro: nonfocal MSK: no peripheral joint swelling/tenderness/warmth; back spines nontender    Labs: Lab Results  Component Value Date   WBC 5.6 11/29/2022   HGB 10.8 (L) 11/29/2022   HCT 34.5 (L) 11/29/2022   MCV 91.0 11/29/2022   PLT 434 (H) 11/29/2022   Last metabolic panel Lab Results  Component Value Date   GLUCOSE 103 (H) 11/29/2022   NA 141 11/29/2022   K 3.6 11/29/2022   CL 109 11/29/2022   CO2 25 11/29/2022   BUN 6 (L) 11/29/2022   CREATININE 0.93 11/29/2022   EGFR 73 11/29/2022   CALCIUM 10.0 11/29/2022   PHOS 2.6 11/07/2022   PROT 7.8 11/29/2022   ALBUMIN 2.5 (L) 11/10/2022   LABGLOB 3.8 03/06/2022   AGRATIO 1.1 (L) 03/06/2022    BILITOT 0.6 11/29/2022   ALKPHOS 36 (L) 11/10/2022   AST 16 11/29/2022   ALT 10 11/29/2022   ANIONGAP 5 11/14/2022   Lab Results  Component Value Date   CRP 4.9 (H) 01/31/2017   Micro:  Serology:  Imaging:  Assessment & Plan:   Problem List Items Addressed This Visit   None      No orders of the defined types were placed in this encounter.    Cellulitis much improved almost resolved pain; no tenderness/erythema/fluctuance on exam  One more week of abx should be sufficient and she'll pick it up today and stopped cefadroxil   Diarrhea likely abx associated and not cdiff, but the latter testing is in progress today   Labs today  F/u 3-4 weeks   --------- 01/04/2023 id assessment Negative cdiff testing Had stopped antibiotics for cellulitis last visit on 11/29/22 She'll be going back to lymph edema clinic to help with compression or una boots wrap  No further diarrhea  Rx kenalog cream to place on for 7-10 days twice a day as needed if sign of venous stasis dermatitis (discussed presentation)  F/u as needed    Follow-up: Return if symptoms worsen or fail to improve.      Raymondo Band, MD [facility] 01/04/2023, 3:22 PM

## 2023-01-04 NOTE — Patient Instructions (Signed)
We discussed difference between cellulitis and venous stasis dermatitis   The later you can have some redness/discomfort within the confine of leg swelling. If it is truly cellulitis then the redness would spread beyond this confine without treatment and the pain is more severe and it is usually a lot more hot/swollen   If you have only venous stasis dermatitis, you can apply triamcinolone ointment twice a day for a week to 10 days as needed   See me as needed for recurrent cellulitis

## 2023-01-08 ENCOUNTER — Encounter: Payer: Medicare HMO | Admitting: Occupational Therapy

## 2023-01-09 ENCOUNTER — Encounter: Payer: Medicare HMO | Admitting: Occupational Therapy

## 2023-01-10 ENCOUNTER — Encounter: Payer: Medicare HMO | Admitting: Occupational Therapy

## 2023-01-11 ENCOUNTER — Encounter: Payer: Medicare HMO | Admitting: Occupational Therapy

## 2023-01-15 ENCOUNTER — Encounter: Payer: Medicare HMO | Admitting: Occupational Therapy

## 2023-01-15 ENCOUNTER — Ambulatory Visit: Payer: Medicare HMO | Admitting: Nurse Practitioner

## 2023-01-16 ENCOUNTER — Encounter: Payer: Medicare HMO | Admitting: Occupational Therapy

## 2023-01-17 ENCOUNTER — Encounter: Payer: Medicare HMO | Admitting: Occupational Therapy

## 2023-01-17 ENCOUNTER — Ambulatory Visit: Payer: Self-pay

## 2023-01-17 NOTE — Patient Outreach (Signed)
  Care Coordination   01/17/2023 Name: [name] MRN: [id number] DOB: 1966/01/05   Care Coordination Outreach Attempts:  An unsuccessful telephone outreach was attempted for a scheduled appointment today.  Follow Up Plan:  Additional outreach attempts will be made to offer the patient care coordination information and services.   Encounter Outcome:  No Answer   Care Coordination Interventions:  No, not indicated    Delsa Sale RN BSN CCM [place]  [facility], [facility] Nurse Care Coordinator  Direct Dial: [phone] Website: [email or web address]

## 2023-01-18 ENCOUNTER — Other Ambulatory Visit: Payer: Self-pay | Admitting: Family Medicine

## 2023-01-18 ENCOUNTER — Encounter: Payer: Medicare HMO | Admitting: Occupational Therapy

## 2023-01-18 DIAGNOSIS — D649 Anemia, unspecified: Secondary | ICD-10-CM

## 2023-01-22 ENCOUNTER — Encounter: Payer: Medicare HMO | Admitting: Occupational Therapy

## 2023-01-24 ENCOUNTER — Encounter: Payer: Medicare HMO | Admitting: Occupational Therapy

## 2023-01-29 ENCOUNTER — Encounter: Payer: Medicare HMO | Admitting: Occupational Therapy

## 2023-01-31 ENCOUNTER — Encounter: Payer: Medicare HMO | Admitting: Occupational Therapy

## 2023-02-05 ENCOUNTER — Encounter: Payer: Medicare HMO | Admitting: Occupational Therapy

## 2023-02-07 ENCOUNTER — Encounter: Payer: Medicare HMO | Admitting: Occupational Therapy

## 2023-02-08 ENCOUNTER — Ambulatory Visit: Payer: Self-pay | Admitting: *Deleted

## 2023-02-08 NOTE — Telephone Encounter (Signed)
Chief Complaint:  Sore Symptoms: Sore present back of left leg, size of pencil eraser. Clear drainage, painful. Slight redness at site Frequency: 1 week Pertinent Negatives: Patient denies fever Disposition: [] ED /[] Urgent Care (no appt availability in office) / [x] Appointment(In office/virtual)/ []  [facility]/ [] Home Care/ [] Refused Recommended Disposition /[] [place] [facility]/ []  Follow-up with PCP Additional Notes:  Pt states wound is related to her lymphedema. Secured appt for tomorrow, first available, pt states that is the only day she has transportation. Care advise provided, verbalizes understanding. Advised ED for worsening symptoms. Reason for Disposition  [1] Looks infected (spreading redness, pus) AND [2] large red area (> 2 in. or 5 cm)  [1] Unexplained sores AND [2] 3 or more  Answer Assessment - Initial Assessment Questions 1. APPEARANCE of SORES: "What do the sores look like?"     Redness around area 2. NUMBER: "How many sores are there?"     One 3. SIZE: "How big is the largest sore?"     Pencil eraser size 4. LOCATION: "Where are the sores located?"     Left leg, back 5. ONSET: "When did the sores begin?"     1 week ago 6. TENDER: "Does it hurt when you touch it?"  (Scale 1-10; or mild, moderate, severe)      Very painful 7. CAUSE: "What do you think is causing the sores?"     Lymphedema. 8. OTHER SYMPTOMS: "Do you have any other symptoms?" (e.g., fever, new weakness)     Drainage,clear at present  Protocols used: [facility]

## 2023-02-09 ENCOUNTER — Ambulatory Visit: Payer: Medicare HMO | Attending: Internal Medicine | Admitting: Internal Medicine

## 2023-02-09 ENCOUNTER — Encounter: Payer: Self-pay | Admitting: Internal Medicine

## 2023-02-09 VITALS — BP 146/82 | HR 102 | Temp 98.1°F | Ht 72.0 in | Wt 350.0 lb

## 2023-02-09 DIAGNOSIS — I890 Lymphedema, not elsewhere classified: Secondary | ICD-10-CM

## 2023-02-09 DIAGNOSIS — L089 Local infection of the skin and subcutaneous tissue, unspecified: Secondary | ICD-10-CM

## 2023-02-09 MED ORDER — CLINDAMYCIN HCL 300 MG PO CAPS
300.0000 mg | ORAL_CAPSULE | Freq: Three times a day (TID) | ORAL | 0 refills | Status: DC
Start: 2023-02-09 — End: 2023-03-15

## 2023-02-09 MED ORDER — MUPIROCIN 2 % EX OINT
1.0000 | TOPICAL_OINTMENT | Freq: Every day | CUTANEOUS | 0 refills | Status: DC
Start: 2023-02-09 — End: 2023-06-13

## 2023-02-09 NOTE — Progress Notes (Signed)
Patient ID: [name], female    DOB: [birth year]  MRN: [id number]  CC: Wound Check (Wound check - pain around ankle since Monday, leakage/)   Subjective: [name] is a 57 y.o. female who presents for urgent care visit.  PCP is [name]. Her concerns today include:  Patient with history of severe lymphedema in the lower extremities, morbid obesity, HTN, HL, PE  Patient was hospitalized in September with cellulitis of the left lower extremity.  She was discharged home with cefadroxil antibiotics which caused diarrhea for her.  She was followed by ID and was changed to a different antibiotic.  She was supposed to follow-up with lymphedema clinic but has not done so as yet.  States she will need a new referral. Presents today complaining of noticing a draining wound on the left lower leg x 1 week.  Not sure of any fever.  Patient Active Problem List   Diagnosis Date Noted   Cellulitis 12/06/2022   AKI (acute kidney injury) (HCC) 11/07/2022   Murmur, cardiac 11/07/2022   Essential hypertension 11/07/2022   Elevated bilirubin 11/07/2022   Rheumatoid arthritis (HCC) 03/06/2022   Lymphedema 03/06/2022   Morbid obesity (HCC) 09/14/2021   Chest pain 04/07/2021   Screening for malignant neoplasm of colon 03/09/2021   Constipation 03/09/2021   Iron deficiency anemia 03/09/2021   Body mass index (BMI) 45.0-49.9, adult (HCC) 08/20/2020   Chronic fatigue syndrome 08/20/2020   Polyarthralgia 08/20/2020   Vitamin D deficiency 08/20/2020   Sepsis due to cellulitis (HCC) 12/31/2018   Elephantiasis 12/31/2018   Ischemic chest pain (HCC) 12/31/2018   History of pulmonary embolus (PE) 12/31/2018   Physical deconditioning 08/27/2018   Adenopathy 09/15/2017   Family history of colon cancer 08/01/2017   Taking multiple medications for chronic disease 08/01/2017   Axillary lymphadenopathy 01/26/2017   Urinary incontinence 01/25/2017   Hypokalemia 01/25/2017   Lobar pneumonia  (HCC) 01/25/2017   Pressure ulcer of sacral region, stage 2 (HCC) 01/25/2017   Abnormal ECG 01/29/2013   Dyspnea    Menorrhagia with regular cycle 02/22/2011   Anemia 02/22/2011     Current Outpatient Medications on File Prior to Visit  Medication Sig Dispense Refill   albuterol (VENTOLIN HFA) 108 (90 Base) MCG/ACT inhaler Inhale 2 puffs into the lungs every 6 (six) hours as needed for wheezing or shortness of breath. 18 g 2   apixaban (ELIQUIS) 5 MG TABS tablet Take 1 tablet (5 mg total) by mouth 2 (two) times daily. 180 tablet 3   ergocalciferol (VITAMIN D2) 1.25 MG (50000 UT) capsule 1 capsule Orally q week for 30 days     ezetimibe (ZETIA) 10 MG tablet Take 1 tablet (10 mg total) by mouth daily. For high cholesterol 90 tablet 3   famotidine (PEPCID) 40 MG tablet Take 1 tablet (40 mg total) by mouth daily. 90 tablet 1   folic acid (FOLVITE) 1 MG tablet Take 1 tablet by mouth once daily 90 tablet 0   loperamide (IMODIUM A-D) 2 MG tablet Take 2 mg by mouth 4 (four) times daily as needed for diarrhea or loose stools.     losartan (COZAAR) 25 MG tablet Take 0.5 tablets (12.5 mg total) by mouth at bedtime. 45 tablet 4   meclizine (ANTIVERT) 12.5 MG tablet Take 1-2 tablets (12.5-25 mg total) by mouth 3 (three) times daily as needed for dizziness. 60 tablet 0   Misc. Devices (BARIATRIC ROLLATOR) MISC Please provide patient with insurance approved bariatric rollator  walker. I89.0, R53.81, R26.81 1 each 0   triamcinolone ointment (KENALOG) 0.5 % Apply 1 Application topically 2 (two) times daily. 30 g 5   No current facility-administered medications on file prior to visit.    Allergies  Allergen Reactions   Abatacept Anaphylaxis    Other reaction(s): Anaphylaxis-Streptomycin Clickject Other reaction(s): Anaphylaxis-Streptomycin   Bee Venom Anaphylaxis   Cefadroxil Diarrhea   Augmentin [Amoxicillin-Pot Clavulanate] Hives, Itching and Other (See Comments)    Did PCN reaction causing  immediate rash, facial/tongue/throat swelling, SOB or lightheadedness with hypotension: yes Did PCN reaction causing severe rash involving mucus membranes or skin necrosis: no Has patient had a PCN reaction that required hospitalization: in hospital Has patient had a PCN reaction occurring within the last 10 years: no If all of the above answers are "NO", then may proceed with Cephalosporin use.  Pt reports no recollection of any reactions when taking penicillin in past   Latex Hives and Other (See Comments)    Local reaction (welts). Patient denies any wheezing or other reaction with latex exposure   Nulytely [Peg 3350-Kcl-Na Bicarb-Nacl]     NAUSEA AND VOMITING. MAY TOLERATE LOW VOLUME PREP.    Social History   Socioeconomic History   Marital status: Single    Spouse name: Not on file   Number of children: 1   Years of education: Not on file   Highest education level: Not on file  Occupational History   Occupation: medical billing    Employer: [employer]  Tobacco Use   Smoking status: Never   Smokeless tobacco: Never  Vaping Use   Vaping status: Never Used  Substance and Sexual Activity   Alcohol use: No   Drug use: No   Sexual activity: Not Currently    Birth control/protection: None, Surgical  Other Topics Concern   Not on file  Social History Narrative   Not on file   Social Drivers of Health   Financial Resource Strain: Medium Risk (12/06/2022)   Overall Financial Resource Strain (CARDIA)    Difficulty of Paying Living Expenses: Somewhat hard  Food Insecurity: No Food Insecurity (11/17/2022)   Hunger Vital Sign    Worried About Running Out of Food in the Last Year: Never true    Ran Out of Food in the Last Year: Never true  Transportation Needs: No Transportation Needs (11/17/2022)   PRAPARE - Administrator, Civil Service (Medical): No    Lack of Transportation (Non-Medical): No  Physical Activity: Insufficiently Active (12/06/2022)   Exercise Vital Sign     Days of Exercise per Week: 3 days    Minutes of Exercise per Session: 30 min  Stress: No Stress Concern Present (12/06/2022)   [employer] of Occupational Health - Occupational Stress Questionnaire    Feeling of Stress : Not at all  Social Connections: Moderately Integrated (12/06/2022)   Social Connection and Isolation Panel [NHANES]    Frequency of Communication with Friends and Family: More than three times a week    Frequency of Social Gatherings with Friends and Family: Twice a week    Attends Religious Services: 1 to 4 times per year    Active Member of [employer] or Organizations: Yes    Attends Banker Meetings: 1 to 4 times per year    Marital Status: Never married  Intimate Partner Violence: Not At Risk (11/07/2022)   Humiliation, Afraid, Rape, and Kick questionnaire    Fear of Current or Ex-Partner: No  Emotionally Abused: No    Physically Abused: No    Sexually Abused: No    Family History  Problem Relation Age of Onset   Heart disease Mother    Hypertension Mother    Hypertension Father    Colon cancer Father 50       Passed away 7 yrs old   Hypertension Sister    Cancer Maternal Grandmother        gastric cancer   Cancer Maternal Grandfather        pancreatic cancer   Colon cancer Paternal Grandmother    Colon cancer Paternal Uncle    Colon polyps Neg Hx     Past Surgical History:  Procedure Laterality Date   ABDOMINAL HYSTERECTOMY     COLONOSCOPY N/A 11/08/2017   Procedure: COLONOSCOPY;  Surgeon: West Bali, MD;  Location: [facility];  Service: Endoscopy;  Laterality: N/A;  2:00pm   FLEXIBLE SIGMOIDOSCOPY N/A 09/17/2017   Procedure: FLEXIBLE SIGMOIDOSCOPY;  Surgeon: West Bali, MD;  Location: [facility];  Service: Endoscopy;  Laterality: N/A;   HERNIA REPAIR  2009   umbilical   POLYPECTOMY  11/08/2017   Procedure: POLYPECTOMY;  Surgeon: West Bali, MD;  Location: [facility];  Service: Endoscopy;;  ascending colon  (CSx1), transverse colon (CS x1), splenic flexure (HSx1)   SVD     Spontaneous vaginal delivery; x 1    ROS: Review of Systems Negative except as stated above  PHYSICAL EXAM: BP (!) 146/82 (BP Location: Left Arm, Patient Position: Sitting, Cuff Size: Large)   Pulse (!) 102   Temp 98.1 F (36.7 C) (Oral)   Ht 6' (1.829 m)   Wt (!) 350 lb (158.8 kg)   LMP 01/31/2011   SpO2 96%   BMI 47.47 kg/m   Physical Exam  General appearance - alert, well appearing, middle-age African-American female and in no distress.  She ambulates with a rollator walker. Mental status - normal mood, behavior, speech, dress, motor activity, and thought processes Skin -patient with severe lymphedema in both lower extremities.  Left lower extremity: Area of concern is at the posterior ankle where she is noted to have some crusting and about a 1 cm open area under the skin fold that is draining clear fluid.  No fluctuance appreciated.  See photo below.      Latest Ref Rng & Units 11/29/2022    3:51 PM 11/23/2022    8:31 AM 11/14/2022    4:17 AM  CMP  Glucose 65 - 99 mg/dL 295  621  308   BUN 7 - 25 mg/dL 6  5  6    Creatinine 0.50 - 1.03 mg/dL 6.57  8.46  9.62   Sodium 135 - 146 mmol/L 141  140  135   Potassium 3.5 - 5.3 mmol/L 3.6  4.0  3.7   Chloride 98 - 110 mmol/L 109  107  103   CO2 20 - 32 mmol/L 25  25  27    Calcium 8.6 - 10.4 mg/dL 95.2  9.3  8.4   Total Protein 6.1 - 8.1 g/dL 7.8  7.6    Total Bilirubin 0.2 - 1.2 mg/dL 0.6  0.7    AST 10 - 35 U/L 16  12    ALT 6 - 29 U/L 10  11     Lipid Panel     Component Value Date/Time   CHOL 164 03/06/2022 1155   TRIG 64 03/06/2022 1155   HDL 56 03/06/2022  1155   CHOLHDL 2.9 03/06/2022 1155   LDLCALC 95 03/06/2022 1155    CBC    Component Value Date/Time   WBC 5.6 11/29/2022 1551   RBC 3.79 (L) 11/29/2022 1551   HGB 10.8 (L) 11/29/2022 1551   HGB 11.5 03/06/2022 1155   HCT 34.5 (L) 11/29/2022 1551   HCT 35.8 03/06/2022 1155   PLT 434 (H)  11/29/2022 1551   PLT 277 03/06/2022 1155   MCV 91.0 11/29/2022 1551   MCV 91 03/06/2022 1155   MCH 28.5 11/29/2022 1551   MCHC 31.3 (L) 11/29/2022 1551   RDW 14.4 11/29/2022 1551   RDW 13.5 03/06/2022 1155   LYMPHSABS 2,134 11/29/2022 1551   LYMPHSABS 1.7 03/06/2022 1155   MONOABS 1.7 (H) 11/08/2022 0350   EOSABS 218 11/29/2022 1551   EOSABS 0.5 (H) 03/06/2022 1155   BASOSABS 39 11/29/2022 1551   BASOSABS 0.0 03/06/2022 1155    ASSESSMENT AND PLAN: 1. Lymphedema (Primary) Will get her back in to lymphedema clinic with [facility] - Ambulatory referral to Physical Therapy  2. Skin infection Area does not look significantly infected.  I recommend cleaning the area daily at least once a day and applying some Bactroban ointment and then applying a 2 x 2 or 4 x 4 gauze between the skin fold.  Will give a short course of clindamycin.  Follow-up if no improvement or any worsening. - clindamycin (CLEOCIN) 300 MG capsule; Take 1 capsule (300 mg total) by mouth 3 (three) times daily.  Dispense: 15 capsule; Refill: 0 - mupirocin ointment (BACTROBAN) 2 %; Apply 1 Application topically daily.  Dispense: 22 g; Refill: 0    Patient was given the opportunity to ask questions.  Patient verbalized understanding of the plan and was able to repeat key elements of the plan.   This documentation was completed using Paediatric nurse.  Any transcriptional errors are unintentional.  No orders of the defined types were placed in this encounter.    Requested Prescriptions    No prescriptions requested or ordered in this encounter    No follow-ups on file.  Jonah Blue, MD, FACP

## 2023-02-12 ENCOUNTER — Encounter: Payer: Medicare HMO | Admitting: Occupational Therapy

## 2023-02-14 ENCOUNTER — Encounter: Payer: Medicare HMO | Admitting: Occupational Therapy

## 2023-02-19 ENCOUNTER — Ambulatory Visit: Payer: Medicare HMO | Admitting: Internal Medicine

## 2023-03-02 ENCOUNTER — Ambulatory Visit: Payer: Self-pay | Admitting: *Deleted

## 2023-03-02 NOTE — Telephone Encounter (Signed)
  Chief Complaint: [name] with [facility] visited pt today.  I called and triaged pt at her request.   A second place on her left lower leg has opened up and is draining clear fluid and has a smell.   The original site is also unchanged after completing the 5 day course of antibiotic.    Symptoms: Also having nausea.   Vomited on New Year's night but no longer doing that.   Just having nausea.   Requesting Phenergan for the nausea.    Also calling her pharmacy for a refill of the cream she has been using on the wound. Frequency: No improvement in wound.   Has a new open one now. Pertinent Negatives: Patient denies pain but it itches real bad. Disposition: [] ED /[] Urgent Care (no appt availability in office) / [] Appointment(In office/virtual)/ []  [facility] [facility]/ [] Home Care/ [] Refused Recommended Disposition /[] [place] [facility]/ [x]  Follow-up with PCP Additional Notes: Report sent to Dr. Vicci since she just saw pt on 02/09/2023 for her wound.   She has a follow up appt with Haze Servant, NP for 03/14/2023.    I called Stacy with [facility] and let her know I had spoken to pt and am sending the information to Dr. Vicci via a confidential message.    Stacy sent me a Teams message that she had received my message.   She was at lunch when I tried to call and left her the message I had spoken with [name].

## 2023-03-02 NOTE — Telephone Encounter (Signed)
 Message from Kermit S sent at 03/02/2023  1:07 PM EST  Summary: Renita leg from cellulitis   Stacy with [facility] called in to inform the provider that the patient leg is weeping from cellulitis and lymphedema. She has been on a medication for it but is almost out. She states it is Mupirocin . She states she would like it if a nurse could call to triage her. Please assist patient further by calling her at [phone]          Call History  Contact Date/Time Type Contact Phone/Fax By  03/02/2023 01:04 PM EST Phone (Incoming) Stacy with [facility] [phone] Peri Selinda SQUIBB   Reason for Disposition  [1] Taking antibiotic > 72 hours (3 days) AND [2] infected wound not improved (i.e., pain, pus, redness)    Completed 5 day course of antibiotic but now a new place has opened up and is draining clear fluid and smells.  Answer Assessment - Initial Assessment Questions 1. LOCATION: Where is the wound located?      A bigger place has opened up on my left lower leg.   The same spot is still open.   Both the old place and new a new place has opened up and is oozing now.   It's a clear drainage and yesterday I noticed an odor coming from these open places.     I've used the last of the cream on both places.  I'm out of it.   I don't have any more antibiotics.   I took the 5 days of the antibiotic as directed by Dr. Vicci.     I don't know what to do.   I don't want to go back to the hospital if I don't have to.    Glade Satterfield with [facility] called into the office after her visit today.   I called pt and did a triage.     Pt. Is going to call her pharmacy for a refill of the cream she is out of.   I'm sending a message to Dr. Vicci letting her know about the wound situation to see if she wants to call in an antibiotic.     2. WOUND APPEARANCE: What does the wound look like?      Clear drainage.    Washing it twice a day with antibacterial soap as she  directed.   It does have smell now.   Noticed a couple of days ago.    3. SIZE: If redness is present, ask: What is the size of the red area? (Inches, centimeters, or compare to size of a coin)      It's itching like crazy.   No bleeding.    4. SPREAD: What's changed in the last day?  Do you see any red streaks coming from the wound?     A new place has opened up and is draining clear fluid.  It also developed a smell 2 days ago.     I got really sick New Year's Day.   I started vomiting right after the ball dropped at midnight literally.    Vomited until 8:30 AM the next morning.   I was wondering if it's the food I ate.   I didn't eat anything different.   Didn't eat any junk.    I ate a fish plate from [employer].    I'm still having nausea but no vomiting.    I'm eating soup today.  No solid food.  Solid food seems to be still upsetting my stomach.  Can she call in something for the nausea?     Phenergan helps.    5. ONSET: When did it start to look infected?      Noted today by Glade Satterfield during her visit from [facility]. 6. MECHANISM: How did the wound start, what was the cause?     It's a chronic issue I've had but it's getting worse. 7. PAIN: Is there any pain? If Yes, ask: How bad is the pain?   (Scale 1-10; or mild, moderate, severe)     No pain but it itches real bad. 8. FEVER: Do you have a fever? If Yes, ask: What is your temperature, how was it measured, and when did it start?     I don't think so 9. OTHER SYMPTOMS: Do you have any other symptoms? (e.g., shaking chills, weakness, rash elsewhere on body)     See above about feeling nauseas and vomiting. 10. PREGNANCY: Is there any chance you are pregnant? When was your last menstrual period?       N/A due to age  Protocols used: Wound Infection-A-AH

## 2023-03-02 NOTE — Telephone Encounter (Signed)
 Spoke with patient. Patient advised UC or ED based on s/s. Patient was agreeable to go the UC

## 2023-03-14 ENCOUNTER — Ambulatory Visit: Payer: Medicare HMO | Attending: Nurse Practitioner | Admitting: Nurse Practitioner

## 2023-03-14 ENCOUNTER — Encounter: Payer: Self-pay | Admitting: Nurse Practitioner

## 2023-03-14 ENCOUNTER — Ambulatory Visit: Payer: Self-pay | Admitting: *Deleted

## 2023-03-14 VITALS — BP 131/64 | HR 89 | Resp 20 | Ht 72.0 in | Wt 347.1 lb

## 2023-03-14 DIAGNOSIS — L03116 Cellulitis of left lower limb: Secondary | ICD-10-CM | POA: Diagnosis not present

## 2023-03-14 DIAGNOSIS — I10 Essential (primary) hypertension: Secondary | ICD-10-CM | POA: Diagnosis not present

## 2023-03-14 DIAGNOSIS — I890 Lymphedema, not elsewhere classified: Secondary | ICD-10-CM

## 2023-03-14 MED ORDER — SULFAMETHOXAZOLE-TRIMETHOPRIM 800-160 MG PO TABS: 1.0000 | ORAL_TABLET | Freq: Two times a day (BID) | ORAL | 0 refills | Status: DC

## 2023-03-14 NOTE — Progress Notes (Signed)
Assessment & Plan:  [name] was seen today for medical management of chronic issues.  Diagnoses and all orders for this visit:  Cellulitis of left lower extremity -     Discontinue: sulfamethoxazole-trimethoprim (BACTRIM DS) 800-160 MG tablet; Take 1 tablet by mouth 2 (two) times daily for 7 days. -     CBC with Differential -     Ambulatory referral to Infectious Disease  Essential hypertension -     Basic metabolic panel    Patient has been counseled on age-appropriate routine health concerns for screening and prevention. These are reviewed and up-to-date. Referrals have been placed accordingly. Immunizations are up-to-date or declined.    Subjective:   Chief Complaint  Patient presents with   Medical Management of Chronic Issues    [name] 58 y.o. female presents to office today for follow up to soft tissue infection.  [name] is a patient of mine with severe lymphedema (poor follow up with lymphedema clinic) who was treated for skin infection on 02-09-2023 with clindamycin.She was evaluated by another provider here in the office on that day.  At that time she had previously been treated for cellulitis of the same area in September with cefadroxil. MRI negative for abscess and blood cx negative  (11-16-2022) Unfortunately she experienced diarrhea with cefadroxil and could not tolerate it for the 3 weeks it was prescribed for. ID switched her to zyvox  OP however she never picked it up and continued to take cefadroxil.   Despite all of this today she reports the leg continues to drain and the area has increased in size compared to her last visit on 02-09-2023. At this time we will restart clindamycin and I will refer her back to ID.   I instructed her on the importance of following up with the lymphedema clinic as soon as possible.  The area at the top of the left leg on the anterior side has started to show mild erythema.       Review of Systems   Constitutional:  Negative for fever, malaise/fatigue and weight loss.  HENT: Negative.  Negative for nosebleeds.   Eyes: Negative.  Negative for blurred vision, double vision and photophobia.  Respiratory: Negative.  Negative for cough and shortness of breath.   Cardiovascular: Negative.  Negative for chest pain, palpitations and leg swelling.  Gastrointestinal: Negative.  Negative for heartburn, nausea and vomiting.  Musculoskeletal: Negative.  Negative for myalgias.  Skin:        SEE HPI  Neurological: Negative.  Negative for dizziness, focal weakness, seizures and headaches.  Psychiatric/Behavioral: Negative.  Negative for suicidal ideas.     Past Medical History:  Diagnosis Date   Anemia    Anxiety    Arthritis    Asthma    hx as child - no prob as adult - no inhaler   Blood transfusion 01/22/11   transfusion 2 units at [facility]   Depression 08/2010   psych assessment   Dyspnea    Fibroid    Headache(784.0)    rx for imitrex - last one jan   Keloid    Lymphedema    Pulmonary embolism [facility])     Past Surgical History:  Procedure Laterality Date   ABDOMINAL HYSTERECTOMY     COLONOSCOPY N/A 11/08/2017   Procedure: COLONOSCOPY;  Surgeon: West Bali, MD;  Location: [facility];  Service: Endoscopy;  Laterality: N/A;  2:00pm   FLEXIBLE SIGMOIDOSCOPY N/A 09/17/2017   Procedure: FLEXIBLE SIGMOIDOSCOPY;  Surgeon: West Bali, MD;  Location: [facility];  Service: Endoscopy;  Laterality: N/A;   HERNIA REPAIR  2009   umbilical   POLYPECTOMY  11/08/2017   Procedure: POLYPECTOMY;  Surgeon: West Bali, MD;  Location: [facility];  Service: Endoscopy;;  ascending colon (CSx1), transverse colon (CS x1), splenic flexure (HSx1)   SVD     Spontaneous vaginal delivery; x 1    Family History  Problem Relation Age of Onset   Heart disease Mother    Hypertension Mother    Hypertension Father    Colon cancer Father 75       Passed away 45 yrs old   Hypertension Sister     Cancer Maternal Grandmother        gastric cancer   Cancer Maternal Grandfather        pancreatic cancer   Colon cancer Paternal Grandmother    Colon cancer Paternal Uncle    Colon polyps Neg Hx     Social History Reviewed with no changes to be made today.   Outpatient Medications Prior to Visit  Medication Sig Dispense Refill   albuterol (VENTOLIN HFA) 108 (90 Base) MCG/ACT inhaler Inhale 2 puffs into the lungs every 6 (six) hours as needed for wheezing or shortness of breath. 18 g 2   apixaban (ELIQUIS) 5 MG TABS tablet Take 1 tablet (5 mg total) by mouth 2 (two) times daily. 180 tablet 3   ezetimibe (ZETIA) 10 MG tablet Take 1 tablet (10 mg total) by mouth daily. For high cholesterol 90 tablet 3   famotidine (PEPCID) 40 MG tablet Take 1 tablet (40 mg total) by mouth daily. 90 tablet 1   folic acid (FOLVITE) 1 MG tablet Take 1 tablet by mouth once daily 90 tablet 0   losartan (COZAAR) 25 MG tablet Take 0.5 tablets (12.5 mg total) by mouth at bedtime. 45 tablet 4   meclizine (ANTIVERT) 12.5 MG tablet Take 1-2 tablets (12.5-25 mg total) by mouth 3 (three) times daily as needed for dizziness. 60 tablet 0   Misc. Devices (BARIATRIC ROLLATOR) MISC Please provide patient with insurance approved bariatric rollator walker. I89.0, R53.81, R26.81 1 each 0   triamcinolone ointment (KENALOG) 0.5 % Apply 1 Application topically 2 (two) times daily. 30 g 5   Ergocalciferol 50 MCG (2000 UT) TABS Take 2,000 Units by mouth daily. (Patient not taking: Reported on 03/14/2023)     loperamide (IMODIUM A-D) 2 MG tablet Take 2 mg by mouth 4 (four) times daily as needed for diarrhea or loose stools. (Patient not taking: Reported on 03/14/2023)     mupirocin ointment (BACTROBAN) 2 % Apply 1 Application topically daily. (Patient not taking: Reported on 03/14/2023) 22 g 0   clindamycin (CLEOCIN) 300 MG capsule Take 1 capsule (300 mg total) by mouth 3 (three) times daily. (Patient not taking: Reported on 03/14/2023)  15 capsule 0   No facility-administered medications prior to visit.    Allergies  Allergen Reactions   Abatacept Anaphylaxis    Other reaction(s): Anaphylaxis-Streptomycin Clickject Other reaction(s): Anaphylaxis-Streptomycin   Bee Venom Anaphylaxis   Cefadroxil Diarrhea   Augmentin [Amoxicillin-Pot Clavulanate] Hives, Itching and Other (See Comments)    Did PCN reaction causing immediate rash, facial/tongue/throat swelling, SOB or lightheadedness with hypotension: yes Did PCN reaction causing severe rash involving mucus membranes or skin necrosis: no Has patient had a PCN reaction that required hospitalization: in hospital Has patient had a PCN reaction occurring within the last 10 years:  no If all of the above answers are "NO", then may proceed with Cephalosporin use.  Pt reports no recollection of any reactions when taking penicillin in past   Latex Hives and Other (See Comments)    Local reaction (welts). Patient denies any wheezing or other reaction with latex exposure   Nulytely [Peg 3350-Kcl-Na Bicarb-Nacl]     NAUSEA AND VOMITING. MAY TOLERATE LOW VOLUME PREP.       Objective:    BP 131/64 (BP Location: Left Arm, Patient Position: Sitting, Cuff Size: Normal)   Pulse 89   Resp 20   Ht 6' (1.829 m)   Wt (!) 347 lb 1.6 oz (157.4 kg)   LMP 01/31/2011   SpO2 100%   BMI 47.08 kg/m  Wt Readings from Last 3 Encounters:  03/14/23 (!) 347 lb 1.6 oz (157.4 kg)  02/09/23 (!) 350 lb (158.8 kg)  01/04/23 (!) 321 lb (145.6 kg)    Physical Exam Vitals and nursing note reviewed.  Constitutional:      Appearance: She is well-developed.  HENT:     Head: Normocephalic and atraumatic.  Cardiovascular:     Rate and Rhythm: Normal rate and regular rhythm.     Heart sounds: Normal heart sounds. No murmur heard.    No friction rub. No gallop.  Pulmonary:     Effort: Pulmonary effort is normal. No tachypnea or respiratory distress.     Breath sounds: Normal breath sounds. No  decreased breath sounds, wheezing, rhonchi or rales.  Chest:     Chest wall: No tenderness.  Abdominal:     General: Bowel sounds are normal.     Palpations: Abdomen is soft.  Musculoskeletal:        General: Normal range of motion.     Cervical back: Normal range of motion.  Skin:    General: Skin is warm and dry.     Comments: SEE PHOTO  Neurological:     Mental Status: She is alert and oriented to person, place, and time.     Coordination: Coordination normal.  Psychiatric:        Behavior: Behavior normal. Behavior is cooperative.        Thought Content: Thought content normal.        Judgment: Judgment normal.          Patient has been counseled extensively about nutrition and exercise as well as the importance of adherence with medications and regular follow-up. The patient was given clear instructions to go to ER or return to medical center if symptoms don't improve, worsen or new problems develop. The patient verbalized understanding.   Follow-up: Return in about 3 months (around 06/12/2023).   Claiborne Rigg, FNP-BC [facility] [place], [place] [phone]   03/15/2023, 9:32 PM

## 2023-03-14 NOTE — Progress Notes (Signed)
Leg wound still oozing

## 2023-03-14 NOTE — Telephone Encounter (Signed)
  Chief Complaint: pharmacist calling to request call back regarding interaction with methotrexate injections and bactrim  DS ordered today. Do you want to stop methotrexate until bactrim  course complete?  Symptoms: na Frequency: na Pertinent Negatives: Patient denies na Disposition: [] ED /[] Urgent Care (no appt availability in office) / [] Appointment(In office/virtual)/ []  [facility]/ [] Home Care/ [] Refused Recommended Disposition /[] [facility]/ [x]  Follow-up with PCP Additional Notes:   [facility] pharmacist Ms . Fernand Howard requesting a call back regarding recent Rx for bactrim  DS and possible interaction with already prescribed methotrexate injections . In review of medications, methotrexate injections not on med list. Pharmacy reports patient received medication injections in November and should be given to self weekly. Please advise and call pharmacy . Pharmacy closed 1:30 -2  pm         Reason for Disposition  [1] Pharmacy calling with prescription question AND [2] triager unable to answer question  Answer Assessment - Initial Assessment Questions 1. NAME of MEDICINE: "What medicine(s) are you calling about?"     Methotrexate injections and bactrim  DS 2. QUESTION: "What is your question?" (e.g., double dose of medicine, side effect)     Pharmacist , Ms Fernand Howard would like to report possible interaction of medication and requesting what PCP would like to do ? Should patient stop methotrexate while taking bactrim  DS ? 3. PRESCRIBER: "Who prescribed the medicine?" Reason: if prescribed by specialist, call should be referred to that group.     PCP bactrim  DS unsure of provider who ordered methotrexate 4. SYMPTOMS: "Do you have any symptoms?" If Yes, ask: "What symptoms are you having?"  "How bad are the symptoms (e.g., mild, moderate, severe)     na 5. PREGNANCY:  "Is there any chance that you are pregnant?" "When was your last menstrual period?"      na  Protocols used: Medication Question Call-A-AH

## 2023-03-15 ENCOUNTER — Encounter: Payer: Self-pay | Admitting: Nurse Practitioner

## 2023-03-15 ENCOUNTER — Other Ambulatory Visit: Payer: Self-pay | Admitting: Nurse Practitioner

## 2023-03-15 DIAGNOSIS — L089 Local infection of the skin and subcutaneous tissue, unspecified: Secondary | ICD-10-CM

## 2023-03-15 LAB — BASIC METABOLIC PANEL
BUN/Creatinine Ratio: 13 (ref 9–23)
BUN: 12 mg/dL (ref 6–24)
CO2: 21 mmol/L (ref 20–29)
Calcium: 10.3 mg/dL — ABNORMAL HIGH (ref 8.7–10.2)
Chloride: 107 mmol/L — ABNORMAL HIGH (ref 96–106)
Creatinine, Ser: 0.93 mg/dL (ref 0.57–1.00)
Glucose: 88 mg/dL (ref 70–99)
Potassium: 4.5 mmol/L (ref 3.5–5.2)
Sodium: 143 mmol/L (ref 134–144)
eGFR: 73 mL/min/{1.73_m2} (ref >59)

## 2023-03-15 LAB — CBC WITH DIFFERENTIAL/PLATELET
Basophils Absolute: 0 10*3/uL (ref 0.0–0.2)
Basos: 1 %
EOS (ABSOLUTE): 0.5 10*3/uL — ABNORMAL HIGH (ref 0.0–0.4)
Eos: 8 %
Hematocrit: 36.3 % (ref 34.0–46.6)
Hemoglobin: 11.6 g/dL (ref 11.1–15.9)
Immature Grans (Abs): 0 10*3/uL (ref 0.0–0.1)
Immature Granulocytes: 0 %
Lymphocytes Absolute: 1.6 10*3/uL (ref 0.7–3.1)
Lymphs: 27 %
MCH: 28.6 pg (ref 26.6–33.0)
MCHC: 32 g/dL (ref 31.5–35.7)
MCV: 89 fL (ref 79–97)
Monocytes Absolute: 0.6 10*3/uL (ref 0.1–0.9)
Monocytes: 10 %
Neutrophils Absolute: 3.2 10*3/uL (ref 1.4–7.0)
Neutrophils: 54 %
Platelets: 235 10*3/uL (ref 150–450)
RBC: 4.06 x10E6/uL (ref 3.77–5.28)
RDW: 13.3 % (ref 11.7–15.4)
WBC: 5.9 10*3/uL (ref 3.4–10.8)

## 2023-03-15 MED ORDER — CLINDAMYCIN HCL 300 MG PO CAPS: 300.0000 mg | ORAL_CAPSULE | Freq: Four times a day (QID) | ORAL | 0 refills | Status: AC

## 2023-03-15 NOTE — Progress Notes (Signed)
Clindamycin SENT. Bactrim dc'd as patient is taking methotrexate.

## 2023-03-15 NOTE — Telephone Encounter (Signed)
Bactrim dc'd. Methtrexate added to profile as she confirms she is taking weekly. Will send clindamycin

## 2023-03-19 ENCOUNTER — Encounter: Payer: Self-pay | Admitting: Internal Medicine

## 2023-03-19 ENCOUNTER — Ambulatory Visit: Payer: Self-pay

## 2023-03-19 ENCOUNTER — Ambulatory Visit (INDEPENDENT_AMBULATORY_CARE_PROVIDER_SITE_OTHER): Payer: Medicare HMO | Admitting: Internal Medicine

## 2023-03-19 ENCOUNTER — Other Ambulatory Visit: Payer: Self-pay

## 2023-03-19 VITALS — BP 140/85 | HR 99 | Temp 98.1°F | Wt 335.0 lb

## 2023-03-19 DIAGNOSIS — I878 Other specified disorders of veins: Secondary | ICD-10-CM

## 2023-03-19 DIAGNOSIS — I890 Lymphedema, not elsewhere classified: Secondary | ICD-10-CM | POA: Diagnosis not present

## 2023-03-19 NOTE — Patient Instructions (Signed)
You have no cellulitis   I would advise that you want to avoid antibiotics as much as possible  If there is angry hot redness that rapidly spreads along with increased severe pain then let me know and we can review if abx is needed   Please follow up with lymphedema clinic as well

## 2023-03-19 NOTE — Progress Notes (Signed)
[facility]  Patient Active Problem List   Diagnosis Date Noted   Cellulitis 12/06/2022   AKI (acute kidney injury) (HCC) 11/07/2022   Murmur, cardiac 11/07/2022   Essential hypertension 11/07/2022   Elevated bilirubin 11/07/2022   Rheumatoid arthritis (HCC) 03/06/2022   Lymphedema 03/06/2022   Morbid obesity (HCC) 09/14/2021   Chest pain 04/07/2021   Screening for malignant neoplasm of colon 03/09/2021   Constipation 03/09/2021   Iron deficiency anemia 03/09/2021   Body mass index (BMI) 45.0-49.9, adult (HCC) 08/20/2020   Chronic fatigue syndrome 08/20/2020   Polyarthralgia 08/20/2020   Vitamin D deficiency 08/20/2020   Sepsis due to cellulitis (HCC) 12/31/2018   Elephantiasis 12/31/2018   Ischemic chest pain (HCC) 12/31/2018   History of pulmonary embolus (PE) 12/31/2018   Physical deconditioning 08/27/2018   Adenopathy 09/15/2017   Family history of colon cancer 08/01/2017   Taking multiple medications for chronic disease 08/01/2017   Axillary lymphadenopathy 01/26/2017   Urinary incontinence 01/25/2017   Hypokalemia 01/25/2017   Lobar pneumonia (HCC) 01/25/2017   Pressure ulcer of sacral region, stage 2 (HCC) 01/25/2017   Abnormal ECG 01/29/2013   Dyspnea    Menorrhagia with regular cycle 02/22/2011   Anemia 02/22/2011      Subjective:    Patient ID: [name], female    DOB: April 17, 1965, 58 y.o.   MRN: [id number]  Chief Complaint  Patient presents with   Follow-up    HPI:  [name] is a 58 y.o. female lymphedema, left leg cellulitis, here for hospital f/u   Patient was discharged on planned 3 weeks cefadroxil but started developing watery diarrhea a week prior to this visit 11/29/22 We have rx'ed zyvox but she haven't picked it up yet.   No n/v/f/c/abd pain  Diarrhea would be at most 3-4 times a day. She also uses immodium  Labs done a week prior showed no leukocytosis, or aki   She still takes  cefadroxil 1000 mg twice a day. Legs pain almost gone 80%. Redness not there any more    03/19/23 id clinic f/u See a&p   Allergies  Allergen Reactions   Abatacept Anaphylaxis    Other reaction(s): Anaphylaxis-Streptomycin Clickject Other reaction(s): Anaphylaxis-Streptomycin   Bee Venom Anaphylaxis   Cefadroxil Diarrhea   Augmentin [Amoxicillin-Pot Clavulanate] Hives, Itching and Other (See Comments)    Did PCN reaction causing immediate rash, facial/tongue/throat swelling, SOB or lightheadedness with hypotension: yes Did PCN reaction causing severe rash involving mucus membranes or skin necrosis: no Has patient had a PCN reaction that required hospitalization: in hospital Has patient had a PCN reaction occurring within the last 10 years: no If all of the above answers are "NO", then may proceed with Cephalosporin use.  Pt reports no recollection of any reactions when taking penicillin in past   Latex Hives and Other (See Comments)    Local reaction (welts). Patient denies any wheezing or other reaction with latex exposure   Nulytely [Peg 3350-Kcl-Na Bicarb-Nacl]     NAUSEA AND VOMITING. MAY TOLERATE LOW VOLUME PREP.      Outpatient Medications Prior to Visit  Medication Sig Dispense Refill   albuterol (VENTOLIN HFA) 108 (90 Base) MCG/ACT inhaler Inhale 2 puffs into the lungs every 6 (six) hours as needed for wheezing or shortness of breath. 18 g 2   apixaban (ELIQUIS) 5 MG TABS tablet Take 1 tablet (5 mg total) by mouth 2 (two) times daily. 180 tablet  3   clindamycin (CLEOCIN) 300 MG capsule Take 1 capsule (300 mg total) by mouth 4 (four) times daily for 5 days. 20 capsule 0   Ergocalciferol 50 MCG (2000 UT) TABS Take 2,000 Units by mouth daily.     ezetimibe (ZETIA) 10 MG tablet Take 1 tablet (10 mg total) by mouth daily. For high cholesterol 90 tablet 3   famotidine (PEPCID) 40 MG tablet Take 1 tablet (40 mg total) by mouth daily. 90 tablet 1   folic acid (FOLVITE) 1 MG  tablet Take 1 tablet by mouth once daily 90 tablet 0   losartan (COZAAR) 25 MG tablet Take 0.5 tablets (12.5 mg total) by mouth at bedtime. 45 tablet 4   meclizine (ANTIVERT) 12.5 MG tablet Take 1-2 tablets (12.5-25 mg total) by mouth 3 (three) times daily as needed for dizziness. 60 tablet 0   Methotrexate Sodium (METHOTREXATE, PF,) 50 MG/2ML injection Inject into the vein once a week.     Misc. Devices (BARIATRIC ROLLATOR) MISC Please provide patient with insurance approved bariatric rollator walker. I89.0, R53.81, R26.81 1 each 0   triamcinolone ointment (KENALOG) 0.5 % Apply 1 Application topically 2 (two) times daily. 30 g 5   loperamide (IMODIUM A-D) 2 MG tablet Take 2 mg by mouth 4 (four) times daily as needed for diarrhea or loose stools. (Patient not taking: Reported on 03/19/2023)     mupirocin ointment (BACTROBAN) 2 % Apply 1 Application topically daily. (Patient not taking: Reported on 03/19/2023) 22 g 0   No facility-administered medications prior to visit.     Social History   Socioeconomic History   Marital status: Single    Spouse name: Not on file   Number of children: 1   Years of education: Not on file   Highest education level: Not on file  Occupational History   Occupation: medical billing    Employer: [employer]  Tobacco Use   Smoking status: Never   Smokeless tobacco: Never  Vaping Use   Vaping status: Never Used  Substance and Sexual Activity   Alcohol use: No   Drug use: No   Sexual activity: Not Currently    Birth control/protection: None, Surgical  Other Topics Concern   Not on file  Social History Narrative   Not on file   Social Drivers of Health   Financial Resource Strain: Medium Risk (12/06/2022)   Overall Financial Resource Strain (CARDIA)    Difficulty of Paying Living Expenses: Somewhat hard  Food Insecurity: Food Insecurity Present (03/19/2023)   Hunger Vital Sign    Worried About Running Out of Food in the Last Year: Often true    Ran Out of  Food in the Last Year: Often true  Transportation Needs: Unmet Transportation Needs (03/19/2023)   PRAPARE - Administrator, Civil Service (Medical): Yes    Lack of Transportation (Non-Medical): Yes  Physical Activity: Insufficiently Active (12/06/2022)   Exercise Vital Sign    Days of Exercise per Week: 3 days    Minutes of Exercise per Session: 30 min  Stress: No Stress Concern Present (12/06/2022)   [employer] of Occupational Health - Occupational Stress Questionnaire    Feeling of Stress : Not at all  Social Connections: Moderately Integrated (12/06/2022)   Social Connection and Isolation Panel [NHANES]    Frequency of Communication with Friends and Family: More than three times a week    Frequency of Social Gatherings with Friends and Family: Twice a week    Attends  Religious Services: 1 to 4 times per year    Active Member of Clubs or Organizations: Yes    Attends Banker Meetings: 1 to 4 times per year    Marital Status: Never married  Intimate Partner Violence: Not At Risk (03/19/2023)   Humiliation, Afraid, Rape, and Kick questionnaire    Fear of Current or Ex-Partner: No    Emotionally Abused: No    Physically Abused: No    Sexually Abused: No      Review of Systems    All other ros negative  Objective:    BP (!) 140/85   Pulse 99   Temp 98.1 F (36.7 C) (Temporal)   Wt (!) 335 lb (152 kg)   LMP 01/31/2011   SpO2 98%   BMI 45.43 kg/m  Nursing note and vital signs reviewed.  Physical Exam     General/constitutional: no distress, pleasant HEENT: Normocephalic, PER, Conj Clear, EOMI, Oropharynx clear Neck supple CV: rrr no mrg Lungs: clear to auscultation, normal respiratory effort Abd: Soft, Nontender Ext: brawny edema bilateral legs; no tenderness or redness Skin/msk: bilateral LE brawny edema "elephantiasis" appearance. Posterior distal left calf with a few spots of open wound but no  tenderness/purulence/warmth/erythema   Labs: Lab Results  Component Value Date   WBC 5.9 03/14/2023   HGB 11.6 03/14/2023   HCT 36.3 03/14/2023   MCV 89 03/14/2023   PLT 235 03/14/2023   Last metabolic panel Lab Results  Component Value Date   GLUCOSE 88 03/14/2023   NA 143 03/14/2023   K 4.5 03/14/2023   CL 107 (H) 03/14/2023   CO2 21 03/14/2023   BUN 12 03/14/2023   CREATININE 0.93 03/14/2023   EGFR 73 03/14/2023   CALCIUM 10.3 (H) 03/14/2023   PHOS 2.6 11/07/2022   PROT 7.8 11/29/2022   ALBUMIN 2.5 (L) 11/10/2022   LABGLOB 3.8 03/06/2022   AGRATIO 1.1 (L) 03/06/2022   BILITOT 0.6 11/29/2022   ALKPHOS 36 (L) 11/10/2022   AST 16 11/29/2022   ALT 10 11/29/2022   ANIONGAP 5 11/14/2022   Lab Results  Component Value Date   CRP 4.9 (H) 01/31/2017   Micro:  Serology:  Imaging:  Assessment & Plan:   Problem List Items Addressed This Visit     Lymphedema   Other Visit Diagnoses       Venous stasis    -  Primary          No orders of the defined types were placed in this encounter.    Cellulitis much improved almost resolved pain; no tenderness/erythema/fluctuance on exam  One more week of abx should be sufficient and she'll pick it up today and stopped cefadroxil   Diarrhea likely abx associated and not cdiff, but the latter testing is in progress today   Labs today  F/u 3-4 weeks   --------- 01/04/2023 id assessment Negative cdiff testing Had stopped antibiotics for cellulitis last visit on 11/29/22 She'll be going back to lymph edema clinic to help with compression or una boots wrap  No further diarrhea  Rx kenalog cream to place on for 7-10 days twice a day as needed if sign of venous stasis dermatitis (discussed presentation)  F/u as needed  -------- 03/19/23 id assessment Reviewed primary care note Patient has a few spot blisters that opened up in left leg No f/c No redness pain Given clinda by pcp but hasn't taken Hasn't  followed up lymphedema clinic  Exam no sign of cellulitis/infection  Advise to avoid abx as she is at risk for cdiff  Need to see lymphedema clinic  Sign of cellulitis discussed  F/u as needed  Follow-up: Return if symptoms worsen or fail to improve.      Raymondo Band, MD [facility] 03/19/2023, 1:35 PM

## 2023-03-20 NOTE — Patient Instructions (Signed)
Visit Information  Thank you for taking time to visit with me today. Please don't hesitate to contact me if I can be of assistance to you.   Following are the goals we discussed today:   Goals Addressed             This Visit's Progress    To better manage lymphedema to bilateral legs/feet   On track    Care Coordination Interventions: Evaluation of current treatment plan related to cellulitis secondary to lymphedema and patient's adherence to plan as established by provider Reviewed and discussed with patient she is experiencing increased drainage and odor to her left lower extremity for which she was prescribed an antibiotic Discussed PCP referral to infectious disease Reviewed and discussed patient's upcoming scheduled appointment with Dr. Renold Don, ID today at 1:30 PM  Discussed PCP referral per Dr. Laural Benes on 02/09/23 for resumption of services with the lymphedema clinic, patient has yet to be contacted by the clinic Discussed patient will ask the ID doctor today if she should resume lymphedema therapy at the clinic due to new onset of cellulitis, she will contact the RN to advise  Discussed plans with patient for ongoing care coordination follow up and provided patient with direct contact information for nurse care coordinator        Our next appointment is by telephone on 04/02/23 at 12:30 PM  Please call the care guide team at [phone] if you need to cancel or reschedule your appointment.   If you are experiencing a Mental Health or Behavioral Health Crisis or need someone to talk to, please call 1-800-273-TALK (toll free, 24 hour hotline)  Patient verbalizes understanding of instructions and care plan provided today and agrees to view in MyChart. Active MyChart status and patient understanding of how to access instructions and care plan via MyChart confirmed with patient.     Delsa Sale RN BSN CCM [place]  [facility], [facility] Nurse Care  Coordinator  Direct Dial: [phone] Website: [email or web address]

## 2023-03-20 NOTE — Patient Outreach (Signed)
  Care Coordination   Follow Up Visit Note   03/19/2023 Name: [name] MRN: [id number] DOB: [birth year]  [name] is a 58 y.o. year old female who sees Claiborne Rigg, NP for primary care. I spoke with  [name] by phone today.  What matters to the patients health and wellness today?  Patient would like to f/u with infectious disease to help manage her LLE cellulitis. She would like to resume lymphedema therapy.     Goals Addressed             This Visit's Progress    To better manage lymphedema to bilateral legs/feet   On track    Care Coordination Interventions: Evaluation of current treatment plan related to cellulitis secondary to lymphedema and patient's adherence to plan as established by provider Reviewed and discussed with patient she is experiencing increased drainage and odor to her left lower extremity for which she was prescribed an antibiotic Discussed PCP referral to infectious disease Reviewed and discussed patient's upcoming scheduled appointment with Dr. Renold Don, ID today at 1:30 PM  Discussed PCP referral per Dr. Laural Benes on 02/09/23 for resumption of services with the lymphedema clinic, patient has yet to be contacted by the clinic Discussed patient will ask the ID doctor today if she should resume lymphedema therapy at the clinic due to new onset of cellulitis, she will contact the RN to advise  Discussed plans with patient for ongoing care coordination follow up and provided patient with direct contact information for nurse care coordinator    Interventions Today    Flowsheet Row Most Recent Value  Chronic Disease   Chronic disease during today's visit Other  [lymphedema]  General Interventions   General Interventions Discussed/Reviewed General Interventions Discussed, General Interventions Reviewed, Doctor Visits, Communication with  Doctor Visits Discussed/Reviewed Doctor Visits Reviewed, Doctor Visits Discussed, Specialist, PCP   Communication with Social Work  [SW Remigio Eisenmenger BSW]  Education Interventions   Education Provided Provided Education  Provided Verbal Education On When to see the doctor, Medication  Pharmacy Interventions   Pharmacy Dicussed/Reviewed Pharmacy Topics Discussed, Pharmacy Topics Reviewed          SDOH assessments and interventions completed:  Yes  SDOH Interventions Today    Flowsheet Row Most Recent Value  SDOH Interventions   Food Insecurity Interventions AMB Referral  Housing Interventions Intervention Not Indicated  Transportation Interventions AMB Referral  Utilities Interventions Intervention Not Indicated        Care Coordination Interventions:  Yes, provided   Follow up plan: Referral made to Remigio Eisenmenger BSW scheduled for 04/04/23 @1 :00 PM for assistance with food insecurities; transportation and resources for housing (for dtr) Follow up call scheduled for 04/02/23 @12 :30 PM    Encounter Outcome:  Patient Visit Completed

## 2023-04-02 ENCOUNTER — Ambulatory Visit: Payer: Self-pay

## 2023-04-02 NOTE — Patient Outreach (Signed)
  Care Coordination   04/02/2023 Name: MERLY HINKSON MRN: [id number] DOB: 1966/02/23   Care Coordination Outreach Attempts:  An unsuccessful outreach was attempted for an appointment today.  Follow Up Plan:  Additional outreach attempts will be made to offer the patient complex care management information and services.   Encounter Outcome:  No Answer   Care Coordination Interventions:  No, not indicated    Delsa Sale RN BSN CCM [place]  [facility], [facility] Nurse Care Coordinator  Direct Dial: [phone] Website: [email or web address]

## 2023-04-04 ENCOUNTER — Ambulatory Visit: Payer: Self-pay | Admitting: *Deleted

## 2023-04-04 ENCOUNTER — Encounter: Payer: Self-pay | Admitting: *Deleted

## 2023-04-04 ENCOUNTER — Ambulatory Visit: Payer: Self-pay | Admitting: Licensed Clinical Social Worker

## 2023-04-04 NOTE — Patient Outreach (Signed)
  Care Coordination   Initial Visit Note   04/04/2023 Name: [name] MRN: [id number] DOB: 11-23-1965  [name] is a 58 y.o. year old female who sees Theotis Haze ORN, NP for primary care. I spoke with  [name] by phone today.  What matters to the patients health and wellness today?  Transportation and food insecurities     Goals Addressed             This Visit's Progress    Care Coordination Activities       Care Coordination Interventions: Patient stated that she is on disability and receives $1435 a mont and receives $23 for food stamps.  Patient stated that she lives alone and her rent is $67 Patient stated that she has [employer] PPO and will be applying for Medicaid to see if she can get some more funding cards and assistance.  Patient stated that she sometimes runs out of food, SW will mail out a food pantry list. Patient stated that she has [employer] but does not like it becaue they are not reliable and they send cars that are to small for her to get in [place] educated  on transportation on how some doctors offices will assist with transportation and patient stated that she was going to check with her medical appointments to see if any of them provide transportation and she also has family that assist her.  Patient wants a nurse to call her today regarding a wound on her leg. SW put a referral in an the rN will call at 2:30 pm today. Patient stated that she may go to the urgent care later today.  SW will follow up with patient on 04/19/2023 at 1:00 pm        SDOH assessments and interventions completed:  Yes  SDOH Interventions Today    Flowsheet Row Most Recent Value  SDOH Interventions   Food Insecurity Interventions Community Resources Provided  [SW Will mail out food pantry resources and educated patinet on [employer] APP]  Housing Interventions Intervention Not Indicated  Transportation Interventions Community Resources Provided  [Patient has [employer] but does  not like it that much and ask her doctors appointments]  Utilities Interventions Intervention Not Indicated        Care Coordination Interventions:  Yes, provided   Follow up plan: Follow up call scheduled for 04/19/2023 at 1:00 pm    Encounter Outcome:  Patient Visit Completed   Tobias CHARM Maranda HEDWIG, PhD [facility], [facility] Social Worker Direct Dial: [phone]  Fax: [phone]

## 2023-04-04 NOTE — Patient Outreach (Signed)
  Care Coordination   Follow Up Visit Note   04/04/2023 Name: [name] MRN: [id number] DOB: [birth year]  [name] is a 58 y.o. year old female who sees Theotis Haze ORN, NP for primary care. I spoke with  Suzen FORBES Lukes by phone today.  What matters to the patients health and wellness today?  Bilateral lymphedema with left greater than right. Concerned about enlarging wound on posterior left leg. Positive for increase in pain, increased size, possible drainage, general malaise and fatigue.    Goals Addressed             This Visit's Progress    Manage Wound Posterior Left Leg       Care Management Goals: Patient will keep all medical appointments Patient will talk with [facility] for Infectious Disease regarding management of posterior left leg wound Patient will continue to clean the wound as directed Patient will continue to apply mupirocin  ointment as directed Patient will reach out to RN Care Manager at [phone] with any resource or care management needs       SDOH assessments and interventions completed:  No    Care Coordination Interventions:  Yes, provided  Interventions Today    Flowsheet Row Most Recent Value  Chronic Disease   Chronic disease during today's visit Other  [Lymphedema]  General Interventions   General Interventions Discussed/Reviewed General Interventions Discussed, General Interventions Reviewed, Labs, Communication with, Doctor Visits  [Pt. C/O increased pain to posterior left leg, general malaise, fatigue, & hot flashes. Pain is 7 out of 10.Normally 3 out of 10. Reports that wound has increased in size & has white/yellow slough with odor. Denies chills & has not taken her temperature.]  Labs --  [facility], BMP]  Doctor Visits Discussed/Reviewed Doctor Visits Discussed, Doctor Visits Reviewed, Specialist  PCP/Specialist Visits Compliance with follow-up visit  [schedule follow-up with Dr Overton with Infectious Disease]   Communication with PCP/Specialists  [Staff message to Infectious Disease clinical pool regarding patients concerns associated with left leg wound. Also requesting refill on mupirocin ]  Education Interventions   Education Provided Provided Education  Provided Verbal Education On When to see the doctor, Labs, Medication, Other  [Wound care. Continue to clean wound as directed.]  Labs Reviewed --  [reviewed CBC and BMP from visit on 03/19/23. WBC were not elevated.]  Pharmacy Interventions   Pharmacy Dicussed/Reviewed Pharmacy Topics Discussed, Pharmacy Topics Reviewed, Medications and their functions  [patient is not on any oral antibiotics. She is applying mupirocin  ointment to leg wound.]       Follow up plan: Follow up call scheduled for 04/05/23    Encounter Outcome:  Patient Visit Completed   Josette Pellet, RN, BSN   [facility], [facility] Health RN Care Manager Direct Dial: [phone]

## 2023-04-04 NOTE — Patient Instructions (Signed)
Visit Information  Thank you for taking time to visit with me today. Please don't hesitate to contact me if I can be of assistance to you.   Following are the goals we discussed today:   Goals Addressed             This Visit's Progress    Care Coordination Activities       Care Coordination Interventions: Patient stated that she is on disability and receives $1435 a mont and receives $23 for food stamps.  Patient stated that she lives alone and her rent is $48 Patient stated that she has [employer] PPO and will be applying for Medicaid to see if she can get some more funding cards and assistance.  Patient stated that she sometimes runs out of food, SW will mail out a food pantry list. Patient stated that she has [employer] but does not like it becaue they are not reliable and they send cars that are to small for her to get in [place] educated  on transportation on how some doctors offices will assist with transportation and patient stated that she was going to check with her medical appointments to see if any of them provide transportation and she also has family that assist her.  Patient wants a nurse to call her today regarding a wound on her leg. SW put a referral in an the rN will call at 2:30 pm today. Patient stated that she may go to the urgent care later today.  SW will follow up with patient on 04/19/2023 at 1:00 pm        Our next appointment is by telephone on 04/19/2023 at 1;00 PM  Please call the care guide team at [phone] if you need to cancel or reschedule your appointment.   If you are experiencing a Mental Health or Behavioral Health Crisis or need someone to talk to, please call the Suicide and Crisis Lifeline: 988 go to [facility] Urgent [facility] [address] [place] [id number]) call 911  Patient verbalizes understanding of instructions and care plan provided today and agrees to view in MyChart. Active MyChart status and patient understanding  of how to access instructions and care plan via MyChart confirmed with patient.     Jeanie Cooks, PhD [facility], [facility] Social Worker Direct Dial: [phone]  Fax: [phone]

## 2023-04-05 ENCOUNTER — Encounter: Payer: Self-pay | Admitting: *Deleted

## 2023-04-05 ENCOUNTER — Other Ambulatory Visit: Payer: Self-pay

## 2023-04-05 ENCOUNTER — Telehealth: Payer: Self-pay

## 2023-04-05 ENCOUNTER — Ambulatory Visit: Payer: Medicare HMO | Admitting: Internal Medicine

## 2023-04-05 ENCOUNTER — Encounter: Payer: Self-pay | Admitting: Internal Medicine

## 2023-04-05 ENCOUNTER — Ambulatory Visit: Payer: Self-pay | Admitting: *Deleted

## 2023-04-05 VITALS — BP 112/78 | HR 93 | Temp 98.0°F | Resp 16 | Ht 72.0 in | Wt 335.0 lb

## 2023-04-05 DIAGNOSIS — I878 Other specified disorders of veins: Secondary | ICD-10-CM

## 2023-04-05 DIAGNOSIS — T148XXA Other injury of unspecified body region, initial encounter: Secondary | ICD-10-CM

## 2023-04-05 DIAGNOSIS — S91002D Unspecified open wound, left ankle, subsequent encounter: Secondary | ICD-10-CM

## 2023-04-05 MED ORDER — OXYCODONE-ACETAMINOPHEN 7.5-325 MG PO TABS
1.0000 | ORAL_TABLET | Freq: Four times a day (QID) | ORAL | 0 refills | Status: AC | PRN
Start: 2023-04-05 — End: 2023-04-20

## 2023-04-05 NOTE — Addendum Note (Signed)
 Addended byCephas Collier T on: 04/05/2023 02:51 PM   Modules accepted: Orders

## 2023-04-05 NOTE — Telephone Encounter (Signed)
 Received call from Kristen RN with [facility] and patient regarding some concerns they are having with pt leg. States since last week has noticed wound size get larger, drainage on leg, and ordor. Is not able to see if there is any redness or of leg is hot to the touch due to being on blood thinners and location. Is requesting appt for evaluation.  Scheduled to come see Dr. Overton today at 230. Will call office back if pain is too much and cancel appt.  Lorenda CHRISTELLA Code, RMA

## 2023-04-05 NOTE — Patient Outreach (Signed)
  Care Coordination   Follow Up Visit Note   04/05/2023 Name: FATHIMA BARTL MRN: [id number] DOB: 11-04-1965  KATRINKA HERBISON is a 58 y.o. year old female who sees Theotis Haze ORN, NP for primary care. I spoke with  Suzen FORBES Lukes by phone today.  What matters to the patients health and wellness today?  Managing posterior left leg wound    Goals Addressed             This Visit's Progress    Manage Wound Posterior Left Leg   On track    Care Management Goals: Patient will keep appointment with Dr Overton at [facility] for Infectious Disease at 2:30 today Patient will continue to clean the wound as directed Patient will continue to apply mupirocin  ointment as directed Patient will reach out to RN Care Manager at [phone] with any resource or care management needs        SDOH assessments and interventions completed:  Yes  SDOH Interventions Today    Flowsheet Row Most Recent Value  SDOH Interventions   Transportation Interventions Other (Comment), Patient Resources (Friends/Family)  [working with BSW Re: transportation resources. Patient's aunt will provide transortation to appointment today.]        Care Coordination Interventions:  Yes, provided  Interventions Today    Flowsheet Row Most Recent Value  Chronic Disease   Chronic disease during today's visit Other  [lymphedema, posterior left leg wound]  General Interventions   General Interventions Discussed/Reviewed General Interventions Discussed, General Interventions Reviewed, Communication with, Doctor Visits  Doctor Visits Discussed/Reviewed Doctor Visits Discussed, Doctor Visits Reviewed, Specialist  PCP/Specialist Visits Compliance with follow-up visit  [Appt. scheduled with Dr Overton for 2:30 today]  Communication with PCP/Specialists  [Conference call to [facility] for Infectious Disease at [phone]. Spoke with triage and patient has been scheduled for an Appt. this afternoon at  2:30]  Education Interventions   Education Provided Provided Education  Provided Verbal Education On When to see the doctor, Medication  Pharmacy Interventions   Pharmacy Dicussed/Reviewed Pharmacy Topics Discussed, Pharmacy Topics Reviewed, Medications and their functions  [applying mupirocin  as directed]  Safety Interventions   Safety Discussed/Reviewed Safety Discussed, Safety Reviewed  [discussed potential for worsening infection]       Follow up plan: Follow up call scheduled for 04/06/23    Encounter Outcome:  Patient Visit Completed   Josette Pellet, RN, BSN [place]  [facility], [facility] Health RN Care Manager Direct Dial: [phone]

## 2023-04-05 NOTE — Progress Notes (Addendum)
 [facility]  Patient Active Problem List   Diagnosis Date Noted   Cellulitis 12/06/2022   AKI (acute kidney injury) (HCC) 11/07/2022   Murmur, cardiac 11/07/2022   Essential hypertension 11/07/2022   Elevated bilirubin 11/07/2022   Rheumatoid arthritis (HCC) 03/06/2022   Lymphedema 03/06/2022   Morbid obesity (HCC) 09/14/2021   Chest pain 04/07/2021   Screening for malignant neoplasm of colon 03/09/2021   Constipation 03/09/2021   Iron  deficiency anemia 03/09/2021   Body mass index (BMI) 45.0-49.9, adult (HCC) 08/20/2020   Chronic fatigue syndrome 08/20/2020   Polyarthralgia 08/20/2020   Vitamin D  deficiency 08/20/2020   Sepsis due to cellulitis (HCC) 12/31/2018   Elephantiasis 12/31/2018   Ischemic chest pain (HCC) 12/31/2018   History of pulmonary embolus (PE) 12/31/2018   Physical deconditioning 08/27/2018   Adenopathy 09/15/2017   Family history of colon cancer 08/01/2017   Taking multiple medications for chronic disease 08/01/2017   Axillary lymphadenopathy 01/26/2017   Urinary incontinence 01/25/2017   Hypokalemia 01/25/2017   Lobar pneumonia (HCC) 01/25/2017   Pressure ulcer of sacral region, stage 2 (HCC) 01/25/2017   Abnormal ECG 01/29/2013   Dyspnea    Menorrhagia with regular cycle 02/22/2011   Anemia 02/22/2011      Subjective:    Patient ID: [name], female    DOB: [birth year], 58 y.o.   MRN: [id number]  No chief complaint on file.   HPI:  [name] is a 58 y.o. female lymphedema, left leg cellulitis, here for hospital f/u   Patient was discharged on planned 3 weeks cefadroxil  but started developing watery diarrhea a week prior to this visit 11/29/22 We have rx'ed zyvox  but she haven't picked it up yet.   No n/v/f/c/abd pain  Diarrhea would be at most 3-4 times a day. She also uses immodium  Labs done a week prior showed no leukocytosis, or aki   She still takes cefadroxil  1000 mg twice  a day. Legs pain almost gone 80%. Redness not there any more    04/05/23 id clinic f/u Patient saw me a couple weeks ago at that time with an opening wound left leg She is back as the wound is larger No purulence No f/c She reports pain in the area No compression wrap from lymphedema clinic as wound is open  Allergies  Allergen Reactions   Abatacept Anaphylaxis    Other reaction(s): Anaphylaxis-Streptomycin Clickject Other reaction(s): Anaphylaxis-Streptomycin   Bee Venom Anaphylaxis   Cefadroxil  Diarrhea   Augmentin  [Amoxicillin -Pot Clavulanate] Hives, Itching and Other (See Comments)    Did PCN reaction causing immediate rash, facial/tongue/throat swelling, SOB or lightheadedness with hypotension: yes Did PCN reaction causing severe rash involving mucus membranes or skin necrosis: no Has patient had a PCN reaction that required hospitalization: in hospital Has patient had a PCN reaction occurring within the last 10 years: no If all of the above answers are NO, then may proceed with Cephalosporin use.  Pt reports no recollection of any reactions when taking penicillin in past   Latex Hives and Other (See Comments)    Local reaction (welts). Patient denies any wheezing or other reaction with latex exposure   Nulytely  [Peg 3350 -Kcl-Na Bicarb-Nacl]     NAUSEA AND VOMITING. MAY TOLERATE LOW VOLUME PREP.      Outpatient Medications Prior to Visit  Medication Sig Dispense Refill   albuterol  (VENTOLIN  HFA) 108 (90 Base) MCG/ACT inhaler Inhale 2 puffs into the lungs every  6 (six) hours as needed for wheezing or shortness of breath. 18 g 2   apixaban  (ELIQUIS ) 5 MG TABS tablet Take 1 tablet (5 mg total) by mouth 2 (two) times daily. 180 tablet 3   Ergocalciferol  50 MCG (2000 UT) TABS Take 2,000 Units by mouth daily.     ezetimibe  (ZETIA ) 10 MG tablet Take 1 tablet (10 mg total) by mouth daily. For high cholesterol 90 tablet 3   famotidine  (PEPCID ) 40 MG tablet Take 1 tablet (40 mg  total) by mouth daily. 90 tablet 1   folic acid  (FOLVITE ) 1 MG tablet Take 1 tablet by mouth once daily 90 tablet 0   loperamide (IMODIUM A-D) 2 MG tablet Take 2 mg by mouth 4 (four) times daily as needed for diarrhea or loose stools. (Patient not taking: Reported on 03/19/2023)     losartan  (COZAAR ) 25 MG tablet Take 0.5 tablets (12.5 mg total) by mouth at bedtime. 45 tablet 4   meclizine  (ANTIVERT ) 12.5 MG tablet Take 1-2 tablets (12.5-25 mg total) by mouth 3 (three) times daily as needed for dizziness. 60 tablet 0   Methotrexate Sodium (METHOTREXATE, PF,) 50 MG/2ML injection Inject into the vein once a week.     Misc. Devices (BARIATRIC ROLLATOR) MISC Please provide patient with insurance approved bariatric rollator walker. I89.0, R53.81, R26.81 1 each 0   mupirocin  ointment (BACTROBAN ) 2 % Apply 1 Application topically daily. (Patient not taking: Reported on 03/19/2023) 22 g 0   triamcinolone  ointment (KENALOG ) 0.5 % Apply 1 Application topically 2 (two) times daily. 30 g 5   No facility-administered medications prior to visit.     Social History   Socioeconomic History   Marital status: Single    Spouse name: Not on file   Number of children: 1   Years of education: Not on file   Highest education level: Not on file  Occupational History   Occupation: medical billing    Employer: [employer]  Tobacco Use   Smoking status: Never   Smokeless tobacco: Never  Vaping Use   Vaping status: Never Used  Substance and Sexual Activity   Alcohol use: No   Drug use: No   Sexual activity: Not Currently    Birth control/protection: None, Surgical  Other Topics Concern   Not on file  Social History Narrative   Not on file   Social Drivers of Health   Financial Resource Strain: Medium Risk (12/06/2022)   Overall Financial Resource Strain (CARDIA)    Difficulty of Paying Living Expenses: Somewhat hard  Food Insecurity: Food Insecurity Present (04/04/2023)   Hunger Vital Sign    Worried About  Running Out of Food in the Last Year: Sometimes true    Ran Out of Food in the Last Year: Sometimes true  Transportation Needs: Unmet Transportation Needs (04/05/2023)   PRAPARE - Transportation    Lack of Transportation (Medical): Yes    Lack of Transportation (Non-Medical): Yes  Physical Activity: Insufficiently Active (12/06/2022)   Exercise Vital Sign    Days of Exercise per Week: 3 days    Minutes of Exercise per Session: 30 min  Stress: No Stress Concern Present (12/06/2022)   [employer] of Occupational Health - Occupational Stress Questionnaire    Feeling of Stress : Not at all  Social Connections: Moderately Integrated (12/06/2022)   Social Connection and Isolation Panel [NHANES]    Frequency of Communication with Friends and Family: More than three times a week    Frequency of Social Gatherings  with Friends and Family: Twice a week    Attends Religious Services: 1 to 4 times per year    Active Member of [employer] or Organizations: Yes    Attends Banker Meetings: 1 to 4 times per year    Marital Status: Never married  Intimate Partner Violence: Not At Risk (04/04/2023)   Humiliation, Afraid, Rape, and Kick questionnaire    Fear of Current or Ex-Partner: No    Emotionally Abused: No    Physically Abused: No    Sexually Abused: No      Review of Systems    All other ros negative  Objective:    LMP 01/31/2011  Nursing note and vital signs reviewed.  Physical Exam     General/constitutional: no distress, pleasant HEENT: Normocephalic, PER, Conj Clear, EOMI, Oropharynx clear Neck supple CV: rrr no mrg Lungs: clear to auscultation, normal respiratory effort Abd: Soft, Nontender Ext: brawny edema bilateral legs; no tenderness or redness Skin/msk: bilateral LE brawny edema elephantiasis appearance. Posterior distal left calf with a coalescing sores now a stage 2 open wound without purulence, surrounding tenderness/fluctuance or warmth  04/05/23 picture  LLE   Labs: Lab Results  Component Value Date   WBC 5.9 03/14/2023   HGB 11.6 03/14/2023   HCT 36.3 03/14/2023   MCV 89 03/14/2023   PLT 235 03/14/2023   Last metabolic panel Lab Results  Component Value Date   GLUCOSE 88 03/14/2023   NA 143 03/14/2023   K 4.5 03/14/2023   CL 107 (H) 03/14/2023   CO2 21 03/14/2023   BUN 12 03/14/2023   CREATININE 0.93 03/14/2023   EGFR 73 03/14/2023   CALCIUM  10.3 (H) 03/14/2023   PHOS 2.6 11/07/2022   PROT 7.8 11/29/2022   ALBUMIN 2.5 (L) 11/10/2022   LABGLOB 3.8 03/06/2022   AGRATIO 1.1 (L) 03/06/2022   BILITOT 0.6 11/29/2022   ALKPHOS 36 (L) 11/10/2022   AST 16 11/29/2022   ALT 10 11/29/2022   ANIONGAP 5 11/14/2022   Lab Results  Component Value Date   CRP 4.9 (H) 01/31/2017   Micro:  Serology:  Imaging:  Assessment & Plan:   Problem List Items Addressed This Visit   None Visit Diagnoses       Venous stasis    -  Primary   Relevant Orders   Ambulatory referral to Wound Clinic     Open wound       Relevant Orders   Ambulatory referral to Wound Clinic           No orders of the defined types were placed in this encounter.  Patient at this time has no sign of cellulitis  Her venous stasis had been complicated by enlarging open wound  She reports lymphedema clinic would like the wound to be closed first before applying compression wrap again   I am referring her to wound clinic today to help manage wound  She has no indication for antibiotics today  No tenderness when I palpate around the wound but she has subjective pain and asks for pain meds --> percocet  Follow up as needed -- sign of celluitis discussed previously   Follow-up: Return if symptoms worsen or fail to improve.      Constance ONEIDA Passer, MD [facility] 04/05/2023, 2:23 PM

## 2023-04-05 NOTE — Patient Instructions (Signed)
 I have placed a referral to wound clinic  In the mean time please do epsom's salt soak and gently cleanse your wound with soap/water    No evidence of infection today and no antibiotics needed  If fever, chill, rapidly spreading swelling/redness/heat please let me know

## 2023-04-06 ENCOUNTER — Encounter: Payer: Self-pay | Admitting: *Deleted

## 2023-04-06 ENCOUNTER — Ambulatory Visit: Payer: Self-pay | Admitting: *Deleted

## 2023-04-06 NOTE — Patient Outreach (Signed)
  Care Coordination   Follow Up Visit Note   04/06/2023 Name: [name] MRN: [id number] DOB: 1965-07-11  [name] is a 58 y.o. year old female who sees [name] ORN, NP for primary care. I spoke with  [name] Lukes by phone today.  What matters to the patients health and wellness today? Improvement in posterior left leg wound.    Goals Addressed             This Visit's Progress    Manage Wound Posterior Left Leg   On track    Care Management Goals: Patient will follow-up with wound care specialist  Patient will continue to clean the wound as directed Patient will continue to apply mupirocin  ointment as directed Patient will reach out to provider or seek emergency medical attention if needed for any new or worsening symptoms Patient will reach out to RN Care Manager at [phone] with any resource or care management needs        SDOH assessments and interventions completed:  No     Care Coordination Interventions:  Yes, provided  Interventions Today    Flowsheet Row Most Recent Value  Chronic Disease   Chronic disease during today's visit Other  [lymphedema, posterior left leg wound]  General Interventions   General Interventions Discussed/Reviewed Communication with, General Interventions Discussed, General Interventions Reviewed, Doctor Visits  Doctor Visits Discussed/Reviewed Doctor Visits Discussed, Doctor Visits Reviewed, Specialist  [reviewed and discussed appointment with infectious disease.]  PCP/Specialist Visits Compliance with follow-up visit  [Follow-up with wound care specialist]  Communication with PCP/Specialists  [message sent to PCP regarding closed wound care referral. Unable to take Pt. in [place] & she is unable to travel to [place]. Can they refer to phyiscal therapy for wound care? Some [facility] provide wound care.]  Education Interventions   Provided Verbal Education On When to see the doctor,  Medication, Other  [wound care]  Pharmacy Interventions   Pharmacy Dicussed/Reviewed Pharmacy Topics Discussed, Pharmacy Topics Reviewed, Medications and their functions  [applying mupirocin  as directed]       Follow up plan: Follow up call scheduled for 04/09/23    Encounter Outcome:  Patient Visit Completed   Lauren Pellet, RN, BSN [name]  [facility], [facility] RN Care Manager Direct Dial: [phone]

## 2023-04-09 ENCOUNTER — Encounter: Payer: Self-pay | Admitting: *Deleted

## 2023-04-09 ENCOUNTER — Ambulatory Visit: Payer: Self-pay | Admitting: *Deleted

## 2023-04-09 NOTE — Patient Outreach (Signed)
  Care Coordination   Follow Up Visit Note   04/09/2023 Name: [name] MRN: [id number] DOB: Sep 09, 1965  [name] is a 58 y.o. year old female who sees Lauren Dean, NP for primary care. I spoke with  [name] by phone today.  What matters to the patients health and wellness today? Following up with wound care specialist and improvement in left posterior leg wound.    Goals Addressed             This Visit's Progress    Manage Wound Posterior Left Leg   On track    Care Management Goals: Patient will follow-up with wound care specialist  Referral has now been sent to [facility] Patient will continue to clean the wound as directed Patient will continue to apply mupirocin  ointment as directed Patient will reach out to provider or seek emergency medical attention if needed for any new or worsening symptoms Patient will reach out to RN Care Manager at [phone] with any resource or care management needs        SDOH assessments and interventions completed:  No SDOH Interventions Today    Flowsheet Row Most Recent Value  SDOH Interventions   Transportation Interventions Patient Resources (Friends/Family)  [working with BSW Re: transportation resources. Patient's feels that family will be able to provide transportation to wound care appointment]        Care Coordination Interventions:  Yes, provided  Interventions Today    Flowsheet Row Most Recent Value  Chronic Disease   Chronic disease during today's visit Other  [lymphedema, posterior left leg wound]  General Interventions   General Interventions Discussed/Reviewed General Interventions Discussed, General Interventions Reviewed, Doctor Visits  Doctor Visits Discussed/Reviewed Doctor Visits Discussed, Doctor Visits Reviewed, Specialist  PCP/Specialist Visits Compliance with follow-up visit  [Referral to [facility] pending. Patient believes she can  secure transportation to that appointment. Encouraged to reach out to Care Management team is assistance is needed.]  Exercise Interventions   Exercise Discussed/Reviewed Physical Activity, Assistive device use and maintanence  Physical Activity Discussed/Reviewed Physical Activity Discussed, Physical Activity Reviewed  Education Interventions   Education Provided Provided Education  Provided Verbal Education On Medication, Other, When to see the doctor  [wound care]  Pharmacy Interventions   Pharmacy Dicussed/Reviewed Pharmacy Topics Discussed, Pharmacy Topics Reviewed, Medications and their functions  [applying mupirocin  as directed]  Safety Interventions   Safety Discussed/Reviewed Safety Discussed, Safety Reviewed, Fall Risk, Home Safety  Home Safety Assistive Devices       Follow up plan: Follow up call scheduled for 04/12/23 with Lauren Roussel, RN Care Manager    Encounter Outcome:  Patient Visit Completed   Lauren Ahle, RN, BSN [place]  [facility], [facility] RN Care Manager Direct Dial: [phone]

## 2023-04-12 ENCOUNTER — Ambulatory Visit: Payer: Self-pay

## 2023-04-12 DIAGNOSIS — M069 Rheumatoid arthritis, unspecified: Secondary | ICD-10-CM

## 2023-04-12 DIAGNOSIS — I890 Lymphedema, not elsewhere classified: Secondary | ICD-10-CM

## 2023-04-12 DIAGNOSIS — I10 Essential (primary) hypertension: Secondary | ICD-10-CM

## 2023-04-12 NOTE — Patient Outreach (Signed)
  Care Coordination   Follow Up Visit Note   04/12/2023 Name: [name] MRN: [id number] DOB: Jul 16, 1965  [name] is a 58 y.o. year old female who sees Claiborne Rigg, NP for primary care. I spoke with  [name] by phone today.  What matters to the patients health and wellness today?  Patient would like to achieve improved pain control to her left lower leg.     Goals Addressed             This Visit's Progress    Manage Wound Posterior Left Leg   On track    Care Management Goals: Patient will follow-up with wound care specialist at [facility], scheduled for 04/30/23 Patient will work with Care Guide to explore alternative resources for transportation  Patient will contact her PCP to schedule an in person visit to discuss referral for pain management  Patient will continue to clean the wound as directed Patient will continue to apply mupirocin ointment as directed (refill request sent to PCP provider) Patient will reach out to provider or seek emergency medical attention if needed for any new or worsening symptoms Patient will reach out to RN Care Manager at [phone] with any resource or care management needs    Interventions Today    Flowsheet Row Most Recent Value  Chronic Disease   Chronic disease during today's visit Other  [lymphedema,  cellulitis]  General Interventions   General Interventions Discussed/Reviewed General Interventions Discussed, General Interventions Reviewed, Doctor Visits, Communication with  Doctor Visits Discussed/Reviewed Doctor Visits Reviewed, Doctor Visits Discussed, Specialist  Communication with PCP/Specialists  Loreen Freud NP]  Education Interventions   Education Provided Provided Education  Provided Verbal Education On When to see the doctor, Medication  Pharmacy Interventions   Pharmacy Dicussed/Reviewed Pharmacy Topics Reviewed, Pharmacy Topics Discussed, Medications and their functions           SDOH assessments and interventions completed:  Yes  SDOH Interventions Today    Flowsheet Row Most Recent Value  SDOH Interventions   Transportation Interventions AMB Referral        Care Coordination Interventions:  Yes, provided   Follow up plan: Referral made to Care Guide for transportation resources Follow up call scheduled for 05/03/23 @11 :00 AM    Encounter Outcome:  Patient Visit Completed

## 2023-04-12 NOTE — Patient Instructions (Signed)
Visit Information  Thank you for taking time to visit with me today. Please don't hesitate to contact me if I can be of assistance to you.   Following are the goals we discussed today:   Goals Addressed             This Visit's Progress    Manage Wound Posterior Left Leg   On track    Care Management Goals: Patient will follow-up with wound care specialist at [facility], scheduled for 04/30/23 Patient will work with Care Guide to explore alternative resources for transportation  Patient will contact her PCP to schedule an in person visit to discuss referral for pain management  Patient will continue to clean the wound as directed Patient will continue to apply mupirocin ointment as directed (refill request sent to PCP provider) Patient will reach out to provider or seek emergency medical attention if needed for any new or worsening symptoms Patient will reach out to RN Care Manager at [phone] with any resource or care management needs        Our next appointment is by telephone on 05/03/23 at 11:00 AM  Please call the care guide team at [phone] if you need to cancel or reschedule your appointment.   If you are experiencing a Mental Health or Behavioral Health Crisis or need someone to talk to, please call 1-800-273-TALK (toll free, 24 hour hotline)  Patient verbalizes understanding of instructions and care plan provided today and agrees to view in MyChart. Active MyChart status and patient understanding of how to access instructions and care plan via MyChart confirmed with patient.     Delsa Sale RN BSN CCM [place]  [facility], [facility] Nurse Care Coordinator  Direct Dial: [phone] Website: [email or web address]

## 2023-04-16 ENCOUNTER — Telehealth: Payer: Self-pay | Admitting: *Deleted

## 2023-04-16 NOTE — Progress Notes (Signed)
Complex Care Management Note Care Guide Note  04/16/2023 Name: [name] MRN: [id number] DOB: 1965-12-16  [name] is a 58 y.o. year old female who is a primary care patient of Claiborne Rigg, NP . The community resource team was consulted for assistance with Transportation Needs   SDOH screenings and interventions completed:  Yes     SDOH Interventions Today    Flowsheet Row Most Recent Value  SDOH Interventions   Food Insecurity Interventions Intervention Not Indicated  [Patient has received food resources in the mail]  Transportation Interventions Community Resources Provided  [Completed a Company secretary request application]        Care guide performed the following interventions: Patient provided with information about care guide support team and interviewed to confirm resource needs.  Follow Up Plan:  No further follow up planned at this time. The patient has been provided with needed resources.  Encounter Outcome:  Patient Visit Completed  Dione Booze  [facility] HealthPopulation Health Care Guide  Direct [phone] Fax:[phone] Website: [email or web address]

## 2023-04-19 ENCOUNTER — Ambulatory Visit: Payer: Self-pay | Admitting: Licensed Clinical Social Worker

## 2023-04-19 NOTE — Patient Outreach (Signed)
Care Coordination   Follow Up Visit Note   04/19/2023 Name: [name] MRN: [id number] DOB: 1965-11-12  [name] is a 58 y.o. year old female who sees Claiborne Rigg, NP for primary care. I spoke with  [name] by phone today.  What matters to the patients health and wellness today?  Resources     Goals Addressed             This Visit's Progress    COMPLETED: Care Coordination Activities       Care Coordination Interventions: Patient stated that she received the resources in the mail and was satisfied and utilized. Patient stated she spoke with another SW about transportation and was waiting to hear back and would call the SW and reach out to check the status.  Patient stated that she was having some pain and drainage form her wound, and that she has an upcoming appointment with the wound clinic. The SW encouraged the patient to call the PCP or the wound clinic asap.  Patient stated to the SW that no other follow calls were needed and that she could handle it from here, the SW encouraged the patient to contact PCP if any [facility] needs arise SW will close this case out           SDOH assessments and interventions completed:  Yes  SDOH Interventions Today    Flowsheet Row Most Recent Value  SDOH Interventions   Food Insecurity Interventions Intervention Not Indicated  Housing Interventions Intervention Not Indicated  Transportation Interventions Intervention Not Indicated  Utilities Interventions Intervention Not Indicated        Care Coordination Interventions:  Yes, provided  Interventions Today    Flowsheet Row Most Recent Value  General Interventions   General Interventions Discussed/Reviewed General Interventions Reviewed, Science writer received reosurces,]       Follow up plan: No further intervention required.   Encounter Outcome:  Patient Visit Completed   Jeanie Cooks, PhD [facility], [facility] Social Worker Direct Dial: [phone]  Fax: [phone]

## 2023-04-19 NOTE — Patient Instructions (Signed)
Visit Information  Thank you for taking time to visit with me today. Please don't hesitate to contact me if I can be of assistance to you.   Following are the goals we discussed today:   Goals Addressed             This Visit's Progress    COMPLETED: Care Coordination Activities       Care Coordination Interventions: Patient stated that she received the resources in the mail and was satisfied and utilized. Patient stated she spoke with another SW about transportation and was waiting to hear back and would call the SW and reach out to check the status.  Patient stated that she was having some pain and drainage form her wound, and that she has an upcoming appointment with the wound clinic. The SW encouraged the patient to call the PCP or the wound clinic asap.  Patient stated to the SW that no other follow calls were needed and that she could handle it from here, the SW encouraged the patient to contact PCP if any [facility] needs arise SW will close this case out          No further follow up needed  Please call the care guide team at [phone] if you need to cancel or reschedule your appointment.   If you are experiencing a Mental Health or Behavioral Health Crisis or need someone to talk to, please call the Suicide and Crisis Lifeline: 988 go to [facility] Urgent [facility] [address] [place] [phone]) call 911  Patient verbalizes understanding of instructions and care plan provided today and agrees to view in MyChart. Active MyChart status and patient understanding of how to access instructions and care plan via MyChart confirmed with patient.     Jeanie Cooks, PhD [facility], [facility] Social Worker Direct Dial: [phone]  Fax: [phone]

## 2023-04-20 ENCOUNTER — Telehealth: Payer: Self-pay | Admitting: *Deleted

## 2023-04-20 ENCOUNTER — Other Ambulatory Visit: Payer: Self-pay | Admitting: Nurse Practitioner

## 2023-04-20 DIAGNOSIS — R1013 Epigastric pain: Secondary | ICD-10-CM

## 2023-04-20 NOTE — Progress Notes (Signed)
Complex Care Management Note Care Guide Note  04/20/2023 Name: [name] MRN: [id number] DOB: 11-30-1965  [name] is a 58 y.o. year old female who is a primary care patient of Claiborne Rigg, NP . The community resource team was consulted for assistance with Transportation Needs   SDOH screenings and interventions completed:  No        Care guide performed the following interventions: Follow up call placed to community resources to determine status of patients referral.  Follow Up Plan:  No further follow up planned at this time. The patient has been provided with needed resources.  Encounter Outcome:  Patient Visit Completed [name]  [facility] HealthPopulation Health Care Guide  Direct [phone] Fax:[phone] Website: [email or web address]

## 2023-04-21 ENCOUNTER — Other Ambulatory Visit: Payer: Self-pay | Admitting: Family Medicine

## 2023-04-21 DIAGNOSIS — D649 Anemia, unspecified: Secondary | ICD-10-CM

## 2023-04-23 NOTE — Telephone Encounter (Signed)
 Requested Prescriptions  Pending Prescriptions Disp Refills   folic acid (FOLVITE) 1 MG tablet [Pharmacy Med Name: Folic Acid 1 MG Oral Tablet] 90 tablet 0    Sig: Take 1 tablet by mouth once daily     Endocrinology:  Vitamins Passed - 04/23/2023  1:23 PM      Passed - Valid encounter within last 12 months    Recent Outpatient Visits           1 month ago Cellulitis of left lower extremity   [facility] [place]. [facility] Claiborne Rigg, NP   2 months ago Lymphedema   [place] [facility] [facility] [place]. [facility] Marcine Matar, MD   4 months ago Lymphedema   [facility] [facility] [place]. [facility] Claiborne Rigg, NP   6 months ago Cellulitis of right upper extremity   [place] [facility] [place] [facility] [place]. [facility] Claiborne Rigg, NP   9 months ago Primary hypertension    [facility] [place]. [facility] Claiborne Rigg, NP       Future Appointments             In 1 month Claiborne Rigg, NP [facility] [place] - [facility] Of . [facility]

## 2023-04-26 ENCOUNTER — Emergency Department (HOSPITAL_BASED_OUTPATIENT_CLINIC_OR_DEPARTMENT_OTHER)
Admission: EM | Admit: 2023-04-26 | Discharge: 2023-04-27 | Disposition: A | Discharge status: Home / Self Care | Payer: Medicare HMO | Attending: Emergency Medicine | Admitting: Emergency Medicine

## 2023-04-26 ENCOUNTER — Other Ambulatory Visit: Payer: Self-pay

## 2023-04-26 DIAGNOSIS — I10 Essential (primary) hypertension: Secondary | ICD-10-CM | POA: Diagnosis not present

## 2023-04-26 DIAGNOSIS — L03116 Cellulitis of left lower limb: Secondary | ICD-10-CM | POA: Diagnosis not present

## 2023-04-26 DIAGNOSIS — Z7901 Long term (current) use of anticoagulants: Secondary | ICD-10-CM | POA: Insufficient documentation

## 2023-04-26 DIAGNOSIS — Z91040 Latex allergy status: Secondary | ICD-10-CM | POA: Diagnosis not present

## 2023-04-26 LAB — CBC WITH DIFFERENTIAL/PLATELET
Abs Immature Granulocytes: 0.02 10*3/uL (ref 0.00–0.07)
Basophils Absolute: 0 10*3/uL (ref 0.0–0.1)
Basophils Relative: 1 %
Eosinophils Absolute: 0.2 10*3/uL (ref 0.0–0.5)
Eosinophils Relative: 5 %
HCT: 35 % — ABNORMAL LOW (ref 36.0–46.0)
Hemoglobin: 10.8 g/dL — ABNORMAL LOW (ref 12.0–15.0)
Immature Granulocytes: 1 %
Lymphocytes Relative: 21 %
Lymphs Abs: 0.9 10*3/uL (ref 0.7–4.0)
MCH: 28.7 pg (ref 26.0–34.0)
MCHC: 30.9 g/dL (ref 30.0–36.0)
MCV: 93.1 fL (ref 80.0–100.0)
Monocytes Absolute: 0.4 10*3/uL (ref 0.1–1.0)
Monocytes Relative: 10 %
Neutro Abs: 2.6 10*3/uL (ref 1.7–7.7)
Neutrophils Relative %: 62 %
Platelets: 265 10*3/uL (ref 150–400)
RBC: 3.76 MIL/uL — ABNORMAL LOW (ref 3.87–5.11)
RDW: 15.5 % (ref 11.5–15.5)
WBC: 4.2 10*3/uL (ref 4.0–10.5)
nRBC: 0 % (ref 0.0–0.2)

## 2023-04-26 LAB — COMPREHENSIVE METABOLIC PANEL
ALT: 7 U/L (ref 0–44)
AST: 17 U/L (ref 15–41)
Albumin: 4.4 g/dL (ref 3.5–5.0)
Alkaline Phosphatase: 50 U/L (ref 38–126)
Anion gap: 9 (ref 5–15)
BUN: 11 mg/dL (ref 6–20)
CO2: 27 mmol/L (ref 22–32)
Calcium: 9.9 mg/dL (ref 8.9–10.3)
Chloride: 105 mmol/L (ref 98–111)
Creatinine, Ser: 0.85 mg/dL (ref 0.44–1.00)
GFR, Estimated: >60 mL/min (ref >60)
Glucose, Bld: 96 mg/dL (ref 70–99)
Potassium: 3.9 mmol/L (ref 3.5–5.1)
Sodium: 141 mmol/L (ref 135–145)
Total Bilirubin: 1.1 mg/dL (ref 0.0–1.2)
Total Protein: 8.4 g/dL — ABNORMAL HIGH (ref 6.5–8.1)

## 2023-04-26 LAB — LACTIC ACID, PLASMA: Lactic Acid, Venous: 2.2 mmol/L (ref 0.5–1.9)

## 2023-04-26 NOTE — ED Triage Notes (Signed)
 Infection in left leg-weeks. Legs are swollen and weeping-lymph edema. Cannot take antibiotics due to C.diff risk.

## 2023-04-27 MED ORDER — CLINDAMYCIN HCL 300 MG PO CAPS: 300.0000 mg | ORAL_CAPSULE | Freq: Four times a day (QID) | ORAL | 0 refills | Status: DC

## 2023-04-27 MED ORDER — CLINDAMYCIN HCL 150 MG PO CAPS: 300.0000 mg | ORAL_CAPSULE | Freq: Once | ORAL | Status: DC

## 2023-04-27 NOTE — ED Provider Notes (Signed)
 [place] EMERGENCY DEPARTMENT AT [facility] Provider Note   CSN: [id number] Arrival date & time: 04/26/23  1655     History  No chief complaint on file.   [name] is a 58 y.o. female.  Patient is a 58 year old female with history of lymphedema followed in the lymphedema clinic and with infectious disease.  She also has history of hypertension, pulmonary embolism and is on blood thinners.  Patient presenting today with drainage from the back of her left leg.  She has extensive lymphedema changes in this area and recurrent cellulitis.  She has been seen by infectious disease who is reluctant to start her on antibiotics due to C. difficile infections.  She denies any she is having any fever.  No injury or trauma.  She is noticing more drainage from the back of the leg and this is her main reason for coming here.  The history is provided by the patient.       Home Medications Prior to Admission medications   Medication Sig Start Date End Date Taking? Authorizing Provider  albuterol (VENTOLIN HFA) 108 (90 Base) MCG/ACT inhaler Inhale 2 puffs into the lungs every 6 (six) hours as needed for wheezing or shortness of breath. 07/12/20   Claiborne Rigg, NP  apixaban (ELIQUIS) 5 MG TABS tablet Take 1 tablet (5 mg total) by mouth 2 (two) times daily. 12/06/22   Claiborne Rigg, NP  Ergocalciferol 50 MCG (2000 UT) TABS Take 2,000 Units by mouth daily.    [provider]  ezetimibe (ZETIA) 10 MG tablet Take 1 tablet (10 mg total) by mouth daily. For high cholesterol 10/11/22   Claiborne Rigg, NP  famotidine (PEPCID) 40 MG tablet Take 1 tablet by mouth once daily 04/20/23   Claiborne Rigg, NP  folic acid (FOLVITE) 1 MG tablet Take 1 tablet by mouth once daily 04/23/23   Hoy Register, MD  loperamide (IMODIUM A-D) 2 MG tablet Take 2 mg by mouth 4 (four) times daily as needed for diarrhea or loose stools. Patient not taking: Reported on 03/14/2023    [provider]  losartan (COZAAR) 25 MG tablet Take 0.5 tablets (12.5 mg total) by mouth at bedtime. 12/01/22   Orbie Pyo, MD  meclizine (ANTIVERT) 12.5 MG tablet Take 1-2 tablets (12.5-25 mg total) by mouth 3 (three) times daily as needed for dizziness. 10/11/22   Claiborne Rigg, NP  Methotrexate Sodium (METHOTREXATE, PF,) 50 MG/2ML injection Inject into the vein once a week.    [provider]  Misc. Devices (BARIATRIC ROLLATOR) MISC Please provide patient with insurance approved bariatric rollator walker. I89.0, R53.81, R26.81 11/05/19   Claiborne Rigg, NP  mupirocin ointment (BACTROBAN) 2 % Apply 1 Application topically daily. Patient not taking: Reported on 03/14/2023 02/09/23   Marcine Matar, MD  triamcinolone ointment (KENALOG) 0.5 % Apply 1 Application topically 2 (two) times daily. 01/04/23   Vu, Tonita Phoenix, MD      Allergies    Abatacept, Bee venom, Cefadroxil, Augmentin [amoxicillin-pot clavulanate], Latex, and Nulytely [peg 3350-kcl-na bicarb-nacl]    Review of Systems   Review of Systems  All other systems reviewed and are negative.   Physical Exam Updated Vital Signs BP 133/77   Pulse 84   Temp 98 F (36.7 C)   Resp 18   LMP 01/31/2011   SpO2 100%  Physical Exam Vitals and nursing note reviewed.  Constitutional:      Appearance: Normal appearance.  HENT:     Head: Normocephalic and atraumatic.  Pulmonary:     Effort: Pulmonary effort is normal.  Skin:    General: Skin is warm and dry.     Comments: Patient has marked lymphedema of both lower extremities.  To the back of the left lower leg, one of the skin folds is noted to have some tissue breakdown with clear/serous drainage.  The foot appears well-perfused although no DP pulses are palpable.  Neurological:     Mental Status: She is alert and oriented to person, place, and time.     ED Results / Procedures / Treatments   Labs (all labs ordered are listed, but only abnormal results are  displayed) Labs Reviewed  LACTIC ACID, PLASMA - Abnormal; Notable for the following components:      Result Value   Lactic Acid, Venous 2.2 (*)    All other components within normal limits  COMPREHENSIVE METABOLIC PANEL - Abnormal; Notable for the following components:   Total Protein 8.4 (*)    All other components within normal limits  CBC WITH DIFFERENTIAL/PLATELET - Abnormal; Notable for the following components:   RBC 3.76 (*)    Hemoglobin 10.8 (*)    HCT 35.0 (*)    All other components within normal limits  LACTIC ACID, PLASMA    EKG None  Radiology No results found.  Procedures Procedures    Medications Ordered in ED Medications - No data to display  ED Course/ Medical Decision Making/ A&P  Patient presenting with weeping from her leg.  She has history of extensive lymphedema and this is a chronic issue.  Physical examination reveals serous drainage from the back of the left leg and is slightly warm to the touch.  CBC and basic metabolic panel unremarkable.  Lactate is mildly elevated at 2.2.  Patient to be treated for cellulitis with clindamycin.  I will refer her back to the lymphedema clinic as this appears to be more of a chronic issue and will require wound care and close follow-up.  I see nothing that indicates she requires admission at this time.  Final Clinical Impression(s) / ED Diagnoses Final diagnoses:  None    Rx / DC Orders ED Discharge Orders     None         Geoffery Lyons, MD 04/27/23 [phone]

## 2023-04-27 NOTE — ED Notes (Signed)
 Pt has lymphedema and left lower leg is weeping serous fluid, ulcer present on back of calf, pt claims it is painful.  Scheduled to see [facility] on Monday.  +odor

## 2023-04-27 NOTE — Discharge Instructions (Signed)
 Begin taking clindamycin as prescribed.  Follow-up with the lymphedema clinic/wound care in the next few days.

## 2023-04-27 NOTE — ED Notes (Signed)
 Discharged by [employer] RN

## 2023-04-30 DIAGNOSIS — Z8669 Personal history of other diseases of the nervous system and sense organs: Secondary | ICD-10-CM | POA: Diagnosis not present

## 2023-04-30 DIAGNOSIS — I872 Venous insufficiency (chronic) (peripheral): Secondary | ICD-10-CM | POA: Diagnosis not present

## 2023-04-30 DIAGNOSIS — Z86711 Personal history of pulmonary embolism: Secondary | ICD-10-CM | POA: Diagnosis not present

## 2023-04-30 DIAGNOSIS — L97222 Non-pressure chronic ulcer of left calf with fat layer exposed: Secondary | ICD-10-CM | POA: Diagnosis not present

## 2023-04-30 DIAGNOSIS — Z86718 Personal history of other venous thrombosis and embolism: Secondary | ICD-10-CM | POA: Diagnosis not present

## 2023-04-30 DIAGNOSIS — I890 Lymphedema, not elsewhere classified: Secondary | ICD-10-CM | POA: Diagnosis not present

## 2023-05-01 DIAGNOSIS — S81802A Unspecified open wound, left lower leg, initial encounter: Secondary | ICD-10-CM | POA: Diagnosis not present

## 2023-05-03 ENCOUNTER — Ambulatory Visit: Payer: Self-pay

## 2023-05-03 NOTE — Patient Outreach (Signed)
 Care Coordination   Follow Up Visit Note   05/03/2023 Name: [name] MRN: [id number] DOB: [birth year]  [name] is a 58 y.o. year old female who sees Claiborne Rigg, NP for primary care. I spoke with  [name] by phone today.  What matters to the patients health and wellness today?  Patient would like to have the cellulitis to her left lower extremity heal without further complication. She would like to re-establish with a lymphedema provider.     Goals Addressed             This Visit's Progress    To better manage lymphedema to bilateral legs/feet   On track    Care Coordination Interventions: Evaluation of current treatment plan related to cellulitis secondary to lymphedema and patient's adherence to plan as established by provider Determined patient experienced increased drainage to her left lower extremity from Cellulitis, secondary to lymphedema, she was evaluated by [facility]  Discussed she completed her initial wound care visit at [facility] with Dr. Nelda Severe MD Review of patient status, including review of consultant's reports, relevant laboratory and other test results, and medications completed Wound cleansing:: Other (Specify) (soap and water)  - Dressing change frequency:: Change dressing every day  - Primary wound dressing:: Other (Specify) (Aquacel Ag)  - Secondary wound dressing:: Other (Specify) (optil ock)  - Secure with:: Roll gauze  - Secure with:: Coban  - Additional Orders/Instructions:: Follow nutritous diet  - Follow Up Appointment:: Return to clinic in 1 week  Determined patient verbalizes understanding of her prescribed treatment plan and reports adherence  Reviewed and discussed with patient her next upcoming scheduled wound visit is set for 05/07/23, she does not have transportation for this appointment  Reviewed recent call from Care Guide, patient's TAMS application was completed on line and was pending at last  follow up Placed unsuccessful call to [facility] transportation, left a vm for [place] (NO PHI left on vm), provided direct number for this RN and requested a call back Sent secure chat to [facility] Guide requesting follow up and or next steps Discussed plans with patient for ongoing care coordination follow up and provided patient with direct contact information for nurse care coordinator Scheduled next nurse follow up call for 05/11/23 @11 :30 PM    Interventions Today    Flowsheet Row Most Recent Value  Chronic Disease   Chronic disease during today's visit Other  [lymphedema,  cellulits to left lower leg]  General Interventions   General Interventions Discussed/Reviewed General Interventions Discussed, General Interventions Reviewed, Doctor Visits, Labs, Communication with  Doctor Visits Discussed/Reviewed Doctor Visits Discussed, Doctor Visits Reviewed, Specialist  Communication with --  [facility],  [employer] Care Guide]  Education Interventions   Education Provided Provided Education  Provided Verbal Education On When to see the doctor, Nutrition, Medication  Nutrition Interventions   Nutrition Discussed/Reviewed Nutrition Discussed, Nutrition Reviewed, Increasing proteins  Pharmacy Interventions   Pharmacy Dicussed/Reviewed Pharmacy Topics Discussed, Pharmacy Topics Reviewed, Medications and their functions          SDOH assessments and interventions completed:  No     Care Coordination Interventions:  Yes, provided   Follow up plan: Follow up call scheduled for 05/11/23 @11 :30 AM    Encounter Outcome:  Patient Visit Completed

## 2023-05-03 NOTE — Patient Instructions (Signed)
 Visit Information  Thank you for taking time to visit with me today. Please don't hesitate to contact me if I can be of assistance to you.   Following are the goals we discussed today:   Goals Addressed             This Visit's Progress    To better manage lymphedema to bilateral legs/feet   On track    Care Coordination Interventions: Evaluation of current treatment plan related to cellulitis secondary to lymphedema and patient's adherence to plan as established by provider Determined patient experienced increased drainage to her left lower extremity from Cellulitis, secondary to lymphedema, she was evaluated by [facility]  Discussed she completed her initial wound care visit at [facility] with Dr. Nelda Severe MD Review of patient status, including review of consultant's reports, relevant laboratory and other test results, and medications completed Wound cleansing:: Other (Specify) (soap and water)  - Dressing change frequency:: Change dressing every day  - Primary wound dressing:: Other (Specify) (Aquacel Ag)  - Secondary wound dressing:: Other (Specify) (optil ock)  - Secure with:: Roll gauze  - Secure with:: Coban  - Additional Orders/Instructions:: Follow nutritous diet  - Follow Up Appointment:: Return to clinic in 1 week  Determined patient verbalizes understanding of her prescribed treatment plan and reports adherence  Reviewed and discussed with patient her next upcoming scheduled wound visit is set for 05/07/23, she does not have transportation for this appointment  Reviewed recent call from Care Guide, patient's TAMS application was completed on line and was pending at last follow up Placed unsuccessful call to [facility] transportation, left a vm for [place] (NO PHI left on vm), provided direct number for this RN and requested a call back Sent secure chat to [facility] Guide requesting follow up and or next steps Discussed plans with patient for ongoing  care coordination follow up and provided patient with direct contact information for nurse care coordinator Scheduled next nurse follow up call for 05/11/23 @11 :30 PM        Our next appointment is by telephone on 05/11/23 at 11:30 AM  Please call the care guide team at [phone] if you need to cancel or reschedule your appointment.   If you are experiencing a Mental Health or Behavioral Health Crisis or need someone to talk to, please call 1-800-273-TALK (toll free, 24 hour hotline)  Patient verbalizes understanding of instructions and care plan provided today and agrees to view in MyChart. Active MyChart status and patient understanding of how to access instructions and care plan via MyChart confirmed with patient.     Delsa Sale RN BSN CCM [place]  [facility], [facility] Health Nurse Care Coordinator  Direct Dial: [phone] Website: [email or web address]

## 2023-05-11 ENCOUNTER — Ambulatory Visit: Payer: Self-pay

## 2023-05-11 DIAGNOSIS — M069 Rheumatoid arthritis, unspecified: Secondary | ICD-10-CM

## 2023-05-11 DIAGNOSIS — L0390 Cellulitis, unspecified: Secondary | ICD-10-CM

## 2023-05-11 DIAGNOSIS — I890 Lymphedema, not elsewhere classified: Secondary | ICD-10-CM

## 2023-05-11 NOTE — Patient Instructions (Signed)
 Visit Information  Thank you for taking time to visit with me today. Please don't hesitate to contact me if I can be of assistance to you.   Following are the goals we discussed today:   Goals Addressed             This Visit's Progress    To better manage lymphedema to bilateral legs/feet   On track    Care Coordination Interventions: Evaluation of current treatment plan related to cellulitis secondary to lymphedema and patient's adherence to plan as established by provider Determined patient missed her wound care follow up visit on 05/07/23 due to not having transportation  Reviewed and discussed with patient her next upcoming scheduled wound visit is set for 05/16/23, she does not have transportation for this appointment  Sent Emergent referral to [id number] to assist with transportation barrier, assist with determining TAMA application status  Reviewed with patient she continues to follow the wound care regimen exactly as directed, she is not able to see the wound, reports the wound continues to be draining and is quiet large Discussed plans with patient for ongoing care coordination follow up and provided patient with direct contact information for nurse care coordinator Advised patient she will be transferred to Juanell Fairly RN, scheduled nurse call with patient for 05/18/23 @11 :00 AM Sent in basket message to Juanell Fairly RN with a patient status update         Our next appointment is by telephone on 05/18/23 at 11:00 AM  Please call the care guide team at [phone] if you need to cancel or reschedule your appointment.   If you are experiencing a Mental Health or Behavioral Health Crisis or need someone to talk to, please call 1-800-273-TALK (toll free, 24 hour hotline)  Patient verbalizes understanding of instructions and care plan provided today and agrees to view in MyChart. Active MyChart status and patient understanding of how to access instructions and care plan via MyChart  confirmed with patient.     Delsa Sale RN BSN CCM [place]  [facility], [facility] Health Nurse Care Coordinator  Direct Dial: [phone] Website: [email or web address]

## 2023-05-11 NOTE — Patient Outreach (Signed)
 Care Coordination   Follow Up Visit Note   05/11/2023 Name: [name] MRN: [id number] DOB: May 16, 1965  [name] is a 58 y.o. year old female who sees Claiborne Rigg, NP for primary care. I spoke with  [name] by phone today.  What matters to the patients health and wellness today?  Patient would like to get her TAMS application approved. She would like to receive the wound care and lymphedema care needed without delay due to transportation issues.     Goals Addressed             This Visit's Progress    To better manage lymphedema to bilateral legs/feet   On track    Care Coordination Interventions: Evaluation of current treatment plan related to cellulitis secondary to lymphedema and patient's adherence to plan as established by provider Determined patient missed her wound care follow up visit on 05/07/23 due to not having transportation  Reviewed and discussed with patient her next upcoming scheduled wound visit is set for 05/16/23, she does not have transportation for this appointment  Sent Emergent referral to [id number] to assist with transportation barrier, assist with determining TAMA application status  Reviewed with patient she continues to follow the wound care regimen exactly as directed, she is not able to see the wound, reports the wound continues to be draining and is quiet large Discussed plans with patient for ongoing care coordination follow up and provided patient with direct contact information for nurse care coordinator Advised patient she will be transferred to Juanell Fairly RN, scheduled nurse call with patient for 05/18/23 @11 :00 AM Sent in basket message to Juanell Fairly RN with a patient status update     Interventions Today    Flowsheet Row Most Recent Value  Chronic Disease   Chronic disease during today's visit Other  [lymphedema,  cellulitis to left lower leg]  General Interventions   General Interventions Discussed/Reviewed General  Interventions Discussed, General Interventions Reviewed, Doctor Visits, Communication with  Doctor Visits Discussed/Reviewed Doctor Visits Discussed, Doctor Visits Reviewed, Specialist  Communication with RN  Juanell Fairly RN]  Education Interventions   Education Provided Provided Education  Provided Verbal Education On When to see the doctor          SDOH assessments and interventions completed:  Yes  SDOH Interventions Today    Flowsheet Row Most Recent Value  SDOH Interventions   Transportation Interventions AMB Referral        Care Coordination Interventions:  Yes, provided   Follow up plan: Follow up call scheduled for 05/18/23 @11 :00 AM    Encounter Outcome:  Patient Visit Completed

## 2023-05-14 ENCOUNTER — Telehealth: Payer: Self-pay

## 2023-05-14 NOTE — Progress Notes (Signed)
 Complex Care Management Note Care Guide Note  05/14/2023 Name: [name] MRN: [id number] DOB: 03-11-1965  [name] is a 58 y.o. year old female who is a primary care patient of Claiborne Rigg, NP . The community resource team was consulted for assistance with Transportation Needs   SDOH screenings and interventions completed:  Yes        Care guide performed the following interventions: Sent email to [facility] at [employer] Eligibility to inquire about the status of application processed online on 04/16/23 by [employer].  Received email response from [facility], she checked her email portal she has not received a Part A online application or a Part B application from the PCP.  Follow Up Plan:   I will call the patient and send an online TAMS application to [employer].  Encounter Outcome:  Patient Visit Completed  Jase Himmelberger [facility]  [facility] Guide Direct Dial: [phone]  Fax: [phone] Website: [email or web address]

## 2023-05-14 NOTE — Progress Notes (Signed)
 Complex Care Management Note Care Guide Note  05/14/2023 Name: KANITRA PURIFOY MRN: [id number] DOB: 1965/08/10  [name] is a 58 y.o. year old female who is a primary care patient of Claiborne Rigg, NP . The community resource team was consulted for assistance with Transportation Needs   SDOH screenings and interventions completed:  Yes  Social Drivers of Health From This Encounter   Food Insecurity: No Food Insecurity (05/14/2023)   Hunger Vital Sign    Worried About Running Out of Food in the Last Year: Never true    Ran Out of Food in the Last Year: Never true  Housing: Low Risk  (05/14/2023)   Housing Stability Vital Sign    Unable to Pay for Housing in the Last Year: No    Number of Times Moved in the Last Year: 0    Homeless in the Last Year: No  Financial Resource Strain: Low Risk  (05/14/2023)   Overall Financial Resource Strain (CARDIA)    Difficulty of Paying Living Expenses: Not very hard  Transportation Needs: Unmet Transportation Needs (05/14/2023)   PRAPARE - Administrator, Civil Service (Medical): Yes    Lack of Transportation (Non-Medical): Yes  Utilities: Not At Risk (05/14/2023)   Utilities    Threatened with loss of utilities: No    SDOH Interventions Today    Flowsheet Row Most Recent Value  SDOH Interventions   Transportation Interventions Other (Comment)  [Completed online TAMS application. Sent email to [place] at [facility] if she could please send confirmation of receipt, how long to process application.I explained to patient they may not be able to process application and schedule ride by 05/16/23.]        Care guide performed the following interventions: Spoke with patient to complete an online TAMS application. Sent email to [place] at [place] that I completed an online application for the patient and if she could please send confirmation and how long it will take to process the application. I explained to the patient they may not be  able to process her application and schedule a ride by Wednesday 05/16/23.   Follow Up Plan:  Care guide will follow up with patient by phone over the next 2 days.  Encounter Outcome:  Patient Visit Completed  Tyson Parkison [facility]  [facility] Guide Direct Dial: [phone]  Fax: [phone] Website: [email or web address]

## 2023-05-15 ENCOUNTER — Telehealth: Payer: Self-pay

## 2023-05-15 NOTE — Progress Notes (Signed)
 Complex Care Management Note Care Guide Note  05/15/2023 Name: [name] MRN: [id number] DOB: 1965/05/01  [name] is a 58 y.o. year old female who is a primary care patient of Lauren Rigg, NP . The community resource team was consulted for assistance with Transportation Needs   SDOH screenings and interventions completed:  Yes  Social Drivers of Health From This Encounter   Transportation Needs: Unmet Transportation Needs (05/15/2023)   PRAPARE - Administrator, Civil Service (Medical): Yes    Lack of Transportation (Non-Medical): Yes    SDOH Interventions Today    Flowsheet Row Most Recent Value  SDOH Interventions   Transportation Interventions Other (Comment)  [name] is a Location manager in [place] system but the address is different. I asked the patient and she has used TAMS before and her address was different. She will need to call them to verify her address and have it changed in the system and renew service.]        Care guide performed the following interventions: Spoke with patient to inform her I called TAMS and her application has not been entered yet. There is a [name] in their system but the address is different.  I asked the patient and she has used TAMS before and her address was different. I gave her the contact number for TAMS since she will need to call them to verify her address and have it updated and service renewed.  Follow Up Plan:  No further follow up planned at this time. The patient has been provided with needed resources.  Encounter Outcome:  Patient Visit Completed  [name] [facility]  [facility] Guide Direct Dial: [phone]  Fax: [phone] Website: [name]

## 2023-05-18 ENCOUNTER — Ambulatory Visit: Payer: Self-pay

## 2023-05-18 NOTE — Patient Outreach (Signed)
 Care Coordination   Initial Visit Note   05/18/2023 Name: [name] MRN: [id number] DOB: 06/12/1965  [name] is a 58 y.o. year old female who sees Lauren Rigg, [name] for primary care. I spoke with  [name] by phone today.  What matters to the patients health and wellness today?  [name] was assessed today as a new patient with diagnoses of cellulitis and lymphedema. She was treated in the emergency room on February 27 for cellulitis, and she continues to manage her lymphedema. [name] previously encountered transportation difficulties; however, she received approval today for assistance through TAMES.  During the consultation, I stressed the importance of contacting the lymphedema clinic located in [facility] to schedule an appointment. She completed the top section of her application, which must be finalized and submitted to the office by the 31st. I inquired whether a family member could deliver the application to her physician's office she said it would be possible.  I will be sending a note to her physician, informing them that the completed paperwork will be provided and requesting that it be filled out and submitted from their office to ensure it is received by the deadline.   [name] articulated that she experiences significant pain related to her lymphedema and has intermittent incontinence, which contributes to her social withdrawal, as she does not wish to express frustration towards others. She feels largely misunderstood and aims to gain better control over her lymphedema and improve her overall health.  I will supply her with relevant information regarding lymphedema, and we have arranged a follow-up appointment in two weeks to discuss further resources and support     Goals Addressed             This Visit's Progress    To manage lymphedema to bilateral legs/feet       Patient Goals/Self Care Activities: -Patient/Caregiver  will take medications as prescribed   -Patient/Caregiver will attend all scheduled provider appointments -Patient/Caregiver will call pharmacy for medication refills 3-7 days in advance of running out of medications -Patient/Caregiver will call provider office for new concerns or questions  -Patient/Caregiver will focus on medication adherence by taking medications as prescribed   Evaluation of current treatment plan related to cellulitis secondary to lymphedema and patient's adherence to plan as established by provider Determined patient missed appointment to lymphedema clinic due to not having transportation patient will call and schedule lymphedema clinica appointment today Sent  Message to her provider that family will bring in papers for [facility] to fill out and send by the 31st of March Reviewed with patient she continues to follow the wound care regimen exactly as directed, she is not able to see the wound, reports the wound continues to be draining  Discussed plans with patient for ongoing care coordination follow up and provided patient with direct contact information care coordinator        SDOH assessments and interventions completed:  Yes  SDOH Interventions Today    Flowsheet Row Most Recent Value  SDOH Interventions   Food Insecurity Interventions Intervention Not Indicated  Housing Interventions Intervention Not Indicated  Utilities Interventions Intervention Not Indicated        Care Coordination Interventions:  Yes, provided   Interventions Today    Flowsheet Row Most Recent Value  Chronic Disease   Chronic disease during today's visit Other  [Lyphedema and Celulitis]  General Interventions   General Interventions Discussed/Reviewed General Interventions Discussed, General Interventions Reviewed,  Communication with  Doctor Visits Discussed/Reviewed PCP  Education Interventions   Education Provided Provided Education  Pharmacy Interventions   Pharmacy  Dicussed/Reviewed Pharmacy Topics Discussed, Pharmacy Topics Reviewed, Medications and their functions  Safety Interventions   Safety Discussed/Reviewed Safety Discussed        Follow up plan: Follow up call scheduled for 06/01/23  130 pm    Encounter Outcome:  Patient Visit Completed   Lauren Fairly RN, BSN, [facility] [place]  [facility], [facility] Health  Care Coordinator Phone: [phone]

## 2023-05-18 NOTE — Patient Instructions (Signed)
 Visit Information  Thank you for taking time to visit with me today. Please don't hesitate to contact me if I can be of assistance to you.   Following are the goals we discussed today:   Goals Addressed   None     Our next appointment is by telephone on 06/01/23 at 130 pm  Please call the care guide team at [phone] if you need to cancel or reschedule your appointment.   If you are experiencing a Mental Health or Behavioral Health Crisis or need someone to talk to, please call 1-800-273-TALK (toll free, 24 hour hotline)  Patient verbalizes understanding of instructions and care plan provided today and agrees to view in MyChart. Active MyChart status and patient understanding of how to access instructions and care plan via MyChart confirmed with patient.     Juanell Fairly RN, BSN, [facility]  [facility], [facility] Health  Care Coordinator Phone: [phone]

## 2023-05-18 NOTE — Patient Instructions (Signed)
 Visit Information  Thank you for taking time to visit with me today. Please don't hesitate to contact me if I can be of assistance to you.   Following are the goals we discussed today:   Goals Addressed             This Visit's Progress    To manage lymphedema to bilateral legs/feet       Patient Goals/Self Care Activities: -Patient/Caregiver will take medications as prescribed   -Patient/Caregiver will attend all scheduled provider appointments -Patient/Caregiver will call pharmacy for medication refills 3-7 days in advance of running out of medications -Patient/Caregiver will call provider office for new concerns or questions  -Patient/Caregiver will focus on medication adherence by taking medications as prescribed   Evaluation of current treatment plan related to cellulitis secondary to lymphedema and patient's adherence to plan as established by provider Determined patient missed appointment to lymphedema clinic due to not having transportation patient will call and schedule lymphedema clinica appointment today Sent  Message to her provider that family will bring in papers for [facility] to fill out and send by the 31st of March Reviewed with patient she continues to follow the wound care regimen exactly as directed, she is not able to see the wound, reports the wound continues to be draining  Discussed plans with patient for ongoing care coordination follow up and provided patient with direct contact information care coordinator        Our next appointment is by telephone on 06/01/23 at 130 pm  Please call the care guide team at [phone] if you need to cancel or reschedule your appointment.   If you are experiencing a Mental Health or Behavioral Health Crisis or need someone to talk to, please call 1-800-273-TALK (toll free, 24 hour hotline)  Patient verbalizes understanding of instructions and care plan provided today and agrees to view in MyChart. Active MyChart status and  patient understanding of how to access instructions and care plan via MyChart confirmed with patient.     Juanell Fairly RN, BSN, [facility] [place]  [facility], [facility]  Care Coordinator Phone: [phone]

## 2023-05-23 ENCOUNTER — Telehealth: Payer: Self-pay

## 2023-05-23 ENCOUNTER — Other Ambulatory Visit: Payer: Self-pay | Admitting: Nurse Practitioner

## 2023-05-23 DIAGNOSIS — Z1231 Encounter for screening mammogram for malignant neoplasm of breast: Secondary | ICD-10-CM

## 2023-05-23 NOTE — Progress Notes (Signed)
 Complex Care Management Note Care Guide Note  05/23/2023 Name: [name] MRN: [id number] DOB: 25-Jun-1965  [name] is a 58 y.o. year old female who is a primary care patient of Claiborne Rigg, NP . The community resource team was consulted for assistance with Transportation Needs   SDOH screenings and interventions completed:  Yes  Social Drivers of Health From This Encounter   Transportation Needs: Unmet Transportation Needs (05/23/2023)   PRAPARE - Administrator, Civil Service (Medical): Yes    Lack of Transportation (Non-Medical): Yes    SDOH Interventions Today    Flowsheet Row Most Recent Value  SDOH Interventions   Transportation Interventions Other (Comment)  [Received email confirmation from [employer], patient's application has been approved.Called patient to verify she has been contacted by [facility], she responded she had. Verified patient's email to send confirmation from [facility] which includes their contact number.]        Care guide performed the following interventions: Received email confirmation from [facility] patients application has been approved.  Called patient to verify she has been contacted by [facility], she responded she had. Verified patient's email address to send email confirmation from [facility] which includes their contact number.  Patient is aware she needs to call a few days in advance of her appointment to schedule transportation.    Follow Up Plan:  No further follow up planned at this time. The patient has been provided with needed resources.  Encounter Outcome:  Patient Visit Completed  [facility]  [facility] Guide Direct Dial: [phone]  Fax: [phone] Website: [email or web address]

## 2023-05-24 ENCOUNTER — Telehealth: Payer: Self-pay

## 2023-05-24 NOTE — Telephone Encounter (Signed)
 I called the patient and completed Part B of [employer] [place] application. She stated that this is a recent.  The completed application was then emailed to [employer]

## 2023-05-29 ENCOUNTER — Ambulatory Visit: Attending: Nurse Practitioner

## 2023-05-29 VITALS — Ht 72.0 in | Wt 350.0 lb

## 2023-05-29 DIAGNOSIS — Z0000 Encounter for general adult medical examination without abnormal findings: Secondary | ICD-10-CM

## 2023-05-29 NOTE — Progress Notes (Signed)
 Because this visit was a virtual/telehealth visit,  certain criteria was not obtained, such a blood pressure, CBG if applicable, and timed get up and go. Any medications not marked as "taking" were not mentioned during the medication reconciliation part of the visit. Any vitals not documented were not able to be obtained due to this being a telehealth visit or patient was unable to self-report a recent blood pressure reading due to a lack of equipment at home via telehealth. Vitals that have been documented are verbally provided by the patient.   Subjective:   [name] is a 58 y.o. who presents for a Medicare Wellness preventive visit.  Visit Complete: Virtual I connected with  [name] on 05/29/23 by a audio enabled telemedicine application and verified that I am speaking with the correct person using two identifiers.  Patient Location: Home  Provider Location: Office/Clinic  I discussed the limitations of evaluation and management by telemedicine. The patient expressed understanding and agreed to proceed.  Vital Signs: Because this visit was a virtual/telehealth visit, some criteria may be missing or patient reported. Any vitals not documented were not able to be obtained and vitals that have been documented are patient reported.  VideoDeclined- This patient declined Librarian, academic. Therefore the visit was completed with audio only.  Persons Participating in Visit: Patient.  AWV Questionnaire: No: Patient Medicare AWV questionnaire was not completed prior to this visit.  Cardiac Risk Factors include: advanced age (>34men, >34 women);family history of premature cardiovascular disease;hypertension;sedentary lifestyle;obesity (BMI >30kg/m2)     Objective:    Today's Vitals   05/29/23 1628  Weight: (!) 350 lb (158.8 kg)  Height: 6' (1.829 m)  PainSc: 10-Worst pain ever  PainLoc: Leg   Body mass index is 47.47 kg/m.     05/29/2023     4:32 PM 04/26/2023    6:45 PM 11/07/2022    5:00 AM 11/06/2022    2:05 PM 09/11/2022    5:17 PM 04/26/2022    2:38 PM 03/01/2022    3:30 PM  Advanced Directives  Does Patient Have a Medical Advance Directive? No No No No No No No  Would patient like information on creating a medical advance directive? No - Patient declined  No - Patient declined No - Patient declined       Current Medications (verified) Outpatient Encounter Medications as of 05/29/2023  Medication Sig   albuterol (VENTOLIN HFA) 108 (90 Base) MCG/ACT inhaler Inhale 2 puffs into the lungs every 6 (six) hours as needed for wheezing or shortness of breath.   apixaban (ELIQUIS) 5 MG TABS tablet Take 1 tablet (5 mg total) by mouth 2 (two) times daily.   Certolizumab Pegol (CIMZIA PREFILLED [place]) Inject 250 mg into the skin every 14 (fourteen) days.   clindamycin (CLEOCIN) 300 MG capsule Take 1 capsule (300 mg total) by mouth 4 (four) times daily. X 7 days (Patient not taking: Reported on 05/18/2023)   Ergocalciferol 50 MCG (2000 UT) TABS Take 2,000 Units by mouth daily.   ezetimibe (ZETIA) 10 MG tablet Take 1 tablet (10 mg total) by mouth daily. For high cholesterol   famotidine (PEPCID) 40 MG tablet Take 1 tablet by mouth once daily   folic acid (FOLVITE) 1 MG tablet Take 1 tablet by mouth once daily   loperamide (IMODIUM A-D) 2 MG tablet Take 2 mg by mouth 4 (four) times daily as needed for diarrhea or loose stools.   losartan (COZAAR) 25 MG  tablet Take 0.5 tablets (12.5 mg total) by mouth at bedtime.   meclizine (ANTIVERT) 12.5 MG tablet Take 1-2 tablets (12.5-25 mg total) by mouth 3 (three) times daily as needed for dizziness.   Methotrexate Sodium (METHOTREXATE, PF,) 50 MG/2ML injection Inject into the vein once a week.   Misc. Devices (BARIATRIC ROLLATOR) MISC Please provide patient with insurance approved bariatric rollator walker. I89.0, R53.81, R26.81   mupirocin ointment (BACTROBAN) 2 % Apply 1 Application topically daily.  (Patient not taking: Reported on 05/18/2023)   triamcinolone ointment (KENALOG) 0.5 % Apply 1 Application topically 2 (two) times daily. (Patient not taking: Reported on 05/18/2023)   No facility-administered encounter medications on file as of 05/29/2023.    Allergies (verified) Abatacept, Bee venom, Cefadroxil, Augmentin [amoxicillin-pot clavulanate], Latex, and Nulytely [peg 3350-kcl-na bicarb-nacl]   History: Past Medical History:  Diagnosis Date   Anemia    Anxiety    Arthritis    Asthma    hx as child - no prob as adult - no inhaler   Blood transfusion 01/22/11   transfusion 2 units at [facility]   Depression 08/2010   psych assessment   Dyspnea    Fibroid    Headache(784.0)    rx for imitrex - last one jan   Keloid    Lymphedema    Pulmonary embolism (HCC)    Past Surgical History:  Procedure Laterality Date   ABDOMINAL HYSTERECTOMY     COLONOSCOPY N/A 11/08/2017   Procedure: COLONOSCOPY;  Surgeon: West Bali, MD;  [facility];  Service: Endoscopy;  Laterality: N/A;  2:00pm   FLEXIBLE SIGMOIDOSCOPY N/A 09/17/2017   Procedure: FLEXIBLE SIGMOIDOSCOPY;  Surgeon: West Bali, MD;  Location: [facility];  Service: Endoscopy;  Laterality: N/A;   HERNIA REPAIR  2009   umbilical   POLYPECTOMY  11/08/2017   Procedure: POLYPECTOMY;  Surgeon: West Bali, MD;  [facility]: [facility];  Service: Endoscopy;;  ascending colon (CSx1), transverse colon (CS x1), splenic flexure (HSx1)   SVD     Spontaneous vaginal delivery; x 1   Family History  Problem Relation Age of Onset   Heart disease Mother    Hypertension Mother    Hypertension Father    Colon cancer Father 104       Passed away 69 yrs old   Hypertension Sister    Cancer Maternal Grandmother        gastric cancer   Cancer Maternal Grandfather        pancreatic cancer   Colon cancer Paternal Grandmother    Colon cancer Paternal Uncle    Colon polyps Neg Hx    Social History   Socioeconomic History    Marital status: Single    Spouse name: Not on file   Number of children: 1   Years of education: Not on file   Highest education level: Not on file  Occupational History   Occupation: medical billing    Employer: [employer]  Tobacco Use   Smoking status: Never   Smokeless tobacco: Never  Vaping Use   Vaping status: Never Used  Substance and Sexual Activity   Alcohol use: No   Drug use: No   Sexual activity: Not Currently    Birth control/protection: None, Surgical  Other Topics Concern   Not on file  Social History Narrative   Not on file   Social Drivers of Health   Financial Resource Strain: Low Risk  (05/14/2023)   Overall Physicist, medical Strain (  CARDIA)    Difficulty of Paying Living Expenses: Not very hard  Food Insecurity: No Food Insecurity (05/14/2023)   Hunger Vital Sign    Worried About Running Out of Food in the Last Year: Never true    Ran Out of Food in the Last Year: Never true  Recent Concern: Food Insecurity - Food Insecurity Present (04/04/2023)   Hunger Vital Sign    Worried About Running Out of Food in the Last Year: Sometimes true    Ran Out of Food in the Last Year: Sometimes true  Transportation Needs: Unmet Transportation Needs (05/23/2023)   PRAPARE - Administrator, Civil Service (Medical): Yes    Lack of Transportation (Non-Medical): Yes  Physical Activity: Insufficiently Active (12/06/2022)   Exercise Vital Sign    Days of Exercise per Week: 3 days    Minutes of Exercise per Session: 30 min  Stress: No Stress Concern Present (12/06/2022)   [employer] of Occupational Health - Occupational Stress Questionnaire    Feeling of Stress : Not at all  Social Connections: Moderately Integrated (12/06/2022)   Social Connection and Isolation Panel [NHANES]    Frequency of Communication with Friends and Family: More than three times a week    Frequency of Social Gatherings with Friends and Family: Twice a week    Attends Religious Services: 1  to 4 times per year    Active Member of [employer] or Organizations: Yes    Attends Banker Meetings: 1 to 4 times per year    Marital Status: Never married    Tobacco Counseling Counseling given: Not Answered    Clinical Intake:  Pre-visit preparation completed: Yes  Pain : 0-10 Pain Score: 10-Worst pain ever Pain Location: Leg Pain Orientation: Left, Right     BMI - recorded: 47.47 Nutritional Status: BMI > 30  Obese Diabetes: No  Lab Results  Component Value Date   HGBA1C 5.1 07/07/2022   HGBA1C 5.7 (H) 11/30/2021   HGBA1C 5.5 05/05/2021     How often do you need to have someone help you when you read instructions, pamphlets, or other written materials from your doctor or pharmacy?: 1 - Never  Interpreter Needed?: No  Information entered by :: Brand Males Taletha Twiford, LPN.   Activities of Daily Living     05/29/2023    4:58 PM 11/07/2022    5:00 AM  In your present state of health, do you have any difficulty performing the following activities:  Hearing? 0 0  Vision? 1 0  Comment LEGALLY BLIND IN LEFT EYE   Difficulty concentrating or making decisions? 0 0  Walking or climbing stairs? 1 1  Comment WALKER   Dressing or bathing? 1 0  Doing errands, shopping? 0 0  Preparing Food and eating ? N   Using the Toilet? Y   In the past six months, have you accidently leaked urine? Y   Do you have problems with loss of bowel control? N   Managing your Medications? N   Managing your Finances? N   Housekeeping or managing your Housekeeping? Y     Patient Care Team: Claiborne Rigg, NP as PCP - General (Nurse Practitioner) Orbie Pyo, MD as PCP - Cardiology (Cardiology) Paulina Fusi, MD as Referring Physician (Orthopedic Surgery) Juanell Fairly, RN as VBCI Care Management Ander Purpura, OD as Consulting Physician (Optometry)  Indicate any recent Medical Services you may have received from other than [facility] providers in the past year (  date may be  approximate).     Assessment:   This is a routine wellness examination for [place].  Hearing/Vision screen Hearing Screening - Comments:: Patient denied any hearing difficulty.   No hearing aids.  Vision Screening - Comments:: Patient does wear corrective lenses.  Patient legally blind in left eye Annual eye exam done by: [facility]    Goals Addressed   None    Depression Screen     05/29/2023    4:52 PM 03/14/2023    3:33 PM 02/09/2023   12:15 PM 01/04/2023    3:25 PM 11/29/2022    3:06 PM 10/11/2022   10:52 AM 04/26/2022    2:39 PM  PHQ 2/9 Scores  PHQ - 2 Score 3 2 1  0 0 0 2  PHQ- 9 Score 18 4 3   2 4     Fall Risk     05/29/2023    4:32 PM 05/18/2023   11:33 AM 01/04/2023    3:25 PM 11/29/2022    3:06 PM 10/11/2022   10:48 AM  Fall Risk   Falls in the past year? 0 0 0 0 1  Number falls in past yr: 0   0 0  Injury with Fall? 0   0 0  Risk for fall due to : No Fall Risks  Other (Comment)  Impaired mobility;Impaired balance/gait  Follow up Falls prevention discussed;Falls evaluation completed  Falls evaluation completed  Falls evaluation completed    MEDICARE RISK AT HOME:  Medicare Risk at Home Any stairs in or around the home?: No If so, are there any without handrails?: No Home free of loose throw rugs in walkways, pet beds, electrical cords, etc?: Yes Adequate lighting in your home to reduce risk of falls?: Yes Life alert?: Yes Use of a cane, walker or w/c?: Yes Grab bars in the bathroom?: No Shower chair or bench in shower?: No Elevated toilet seat or a handicapped toilet?: No  TIMED UP AND GO:  Was the test performed?  No  Cognitive Function: 6CIT completed    05/29/2023    5:00 PM  MMSE - Mini Mental State Exam  Not completed: Unable to complete        05/29/2023    5:00 PM 04/26/2022    2:43 PM 10/30/2020   11:30 AM  6CIT Screen  What Year? 0 points 0 points 0 points  What month? 0 points 0 points 0 points  What time? 0 points 0  points 0 points  Count back from 20 0 points 0 points 0 points  Months in reverse 0 points 0 points 0 points  Repeat phrase 0 points 0 points 0 points  Total Score 0 points 0 points 0 points    Immunizations Immunization History  Administered Date(s) Administered   Influenza Split 01/23/2011   PNEUMOCOCCAL CONJUGATE-20 01/14/2021   PPD Test 05/19/2013   Tdap 02/21/2021    Screening Tests Health Maintenance  Topic Date Due   Cervical Cancer Screening (HPV/Pap Cotest)  Never done   COVID-19 Vaccine (1 - 2024-25 season) Never done   Zoster Vaccines- Shingrix (1 of 2) 06/12/2023 (Originally 02/14/2018)   INFLUENZA VACCINE  09/28/2023   MAMMOGRAM  05/02/2024   Medicare Annual Wellness (AWV)  05/28/2024   Colonoscopy  11/09/2027   DTaP/Tdap/Td (2 - Td or Tdap) 02/22/2031   Pneumococcal Vaccine 12-63 Years old  Completed   Hepatitis C Screening  Completed   HIV Screening  Completed   HPV VACCINES  Aged Out    Health Maintenance  Health Maintenance Due  Topic Date Due   Cervical Cancer Screening (HPV/Pap Cotest)  Never done   COVID-19 Vaccine (1 - 2024-25 season) Never done   Health Maintenance Items Addressed: See Nurse Notes  Additional Screening:  Vision Screening: Recommended annual ophthalmology exams for early detection of glaucoma and other disorders of the eye.  Dental Screening: Recommended annual dental exams for proper oral hygiene  Community Resource Referral / Chronic Care Management: CRR required this visit?  No   CCM required this visit?  No     Plan:     I have personally reviewed and noted the following in the patient's chart:   Medical and social history Use of alcohol, tobacco or illicit drugs  Current medications and supplements including opioid prescriptions. Patient is not currently taking opioid prescriptions. Functional ability and status Nutritional status Physical activity Advanced directives List of other  physicians Hospitalizations, surgeries, and ER visits in previous 12 months Vitals Screenings to include cognitive, depression, and falls Referrals and appointments  In addition, I have reviewed and discussed with patient certain preventive protocols, quality metrics, and best practice recommendations. A written personalized care plan for preventive services as well as general preventive health recommendations were provided to patient.     Mickeal Needy, LPN   03/04/1094   After Visit Summary: (MyChart) Due to this being a telephonic visit, the after visit summary with patients personalized plan was offered to patient via MyChart   Notes: Please refer to Routing Comments.

## 2023-05-29 NOTE — Patient Instructions (Signed)
 [name] , Thank you for taking time to come for your Medicare Wellness Visit. I appreciate your ongoing commitment to your health goals. Please review the following plan we discussed and let me know if I can assist you in the future.   Referrals/Orders/Follow-Ups/Clinician Recommendations: Keep maintaining your health by keeping your appointments with [name] and any specialists that you may see.  Call us if you need anything.  Have a great year!!!!  This is a list of the screening recommended for you and due dates:  Health Maintenance  Topic Date Due   Pap with HPV screening  Never done   COVID-19 Vaccine (1 - 2024-25 season) Never done   Zoster (Shingles) Vaccine (1 of 2) 06/12/2023*   Flu Shot  09/28/2023   Mammogram  05/02/2024   Medicare Annual Wellness Visit  05/28/2024   Colon Cancer Screening  11/09/2027   DTaP/Tdap/Td vaccine (2 - Td or Tdap) 02/22/2031   Pneumococcal Vaccination  Completed   Hepatitis C Screening  Completed   HIV Screening  Completed   HPV Vaccine  Aged Out  *Topic was postponed. The date shown is not the original due date.    Advanced directives: (Declined) Advance directive discussed with you today. Even though you declined this today, please call our office should you change your mind, and we can give you the proper paperwork for you to fill out.  Next Medicare Annual Wellness Visit scheduled for next year: Yes

## 2023-05-30 ENCOUNTER — Ambulatory Visit
Admission: RE | Admit: 2023-05-30 | Discharge: 2023-05-30 | Disposition: A | Discharge status: Home / Self Care | Source: Ambulatory Visit | Attending: Nurse Practitioner

## 2023-05-30 DIAGNOSIS — Z1231 Encounter for screening mammogram for malignant neoplasm of breast: Secondary | ICD-10-CM | POA: Diagnosis not present

## 2023-05-31 DIAGNOSIS — I2699 Other pulmonary embolism without acute cor pulmonale: Secondary | ICD-10-CM | POA: Diagnosis not present

## 2023-05-31 DIAGNOSIS — Z7901 Long term (current) use of anticoagulants: Secondary | ICD-10-CM | POA: Diagnosis not present

## 2023-05-31 DIAGNOSIS — S81802A Unspecified open wound, left lower leg, initial encounter: Secondary | ICD-10-CM | POA: Diagnosis not present

## 2023-05-31 DIAGNOSIS — L989 Disorder of the skin and subcutaneous tissue, unspecified: Secondary | ICD-10-CM | POA: Diagnosis not present

## 2023-05-31 DIAGNOSIS — Z79899 Other long term (current) drug therapy: Secondary | ICD-10-CM | POA: Diagnosis not present

## 2023-05-31 DIAGNOSIS — I890 Lymphedema, not elsewhere classified: Secondary | ICD-10-CM | POA: Diagnosis not present

## 2023-05-31 DIAGNOSIS — I872 Venous insufficiency (chronic) (peripheral): Secondary | ICD-10-CM | POA: Diagnosis not present

## 2023-05-31 DIAGNOSIS — J45909 Unspecified asthma, uncomplicated: Secondary | ICD-10-CM | POA: Diagnosis not present

## 2023-05-31 DIAGNOSIS — T148XXA Other injury of unspecified body region, initial encounter: Secondary | ICD-10-CM | POA: Diagnosis not present

## 2023-05-31 DIAGNOSIS — I10 Essential (primary) hypertension: Secondary | ICD-10-CM | POA: Diagnosis not present

## 2023-05-31 DIAGNOSIS — L97222 Non-pressure chronic ulcer of left calf with fat layer exposed: Secondary | ICD-10-CM | POA: Diagnosis not present

## 2023-05-31 DIAGNOSIS — Z86718 Personal history of other venous thrombosis and embolism: Secondary | ICD-10-CM | POA: Diagnosis not present

## 2023-05-31 DIAGNOSIS — I83022 Varicose veins of left lower extremity with ulcer of calf: Secondary | ICD-10-CM | POA: Diagnosis not present

## 2023-05-31 DIAGNOSIS — L819 Disorder of pigmentation, unspecified: Secondary | ICD-10-CM | POA: Diagnosis not present

## 2023-06-01 ENCOUNTER — Ambulatory Visit: Payer: Self-pay

## 2023-06-01 NOTE — Patient Instructions (Signed)
 Visit Information  Thank you for taking time to visit with me today. Please don't hesitate to contact me if I can be of assistance to you.   Following are the goals we discussed today:   Goals Addressed             This Visit's Progress    COMPLETED: Manage Wound Posterior Left Leg       Care Management Goals: Patient will follow-up with wound care specialist at [facility], scheduled for 04/30/23 Patient will work with Care Guide to explore alternative resources for transportation  Patient will contact her PCP to schedule an in person visit to discuss referral for pain management  Patient will continue to clean the wound as directed Patient will continue to apply mupirocin ointment as directed (refill request sent to PCP provider) Patient will reach out to provider or seek emergency medical attention if needed for any new or worsening symptoms Patient will reach out to RN Care Manager at [phone] with any resource or care management needs     COMPLETED: To better manage lymphedema to bilateral legs/feet       Care Coordination Interventions: Evaluation of current treatment plan related to cellulitis secondary to lymphedema and patient's adherence to plan as established by provider Determined patient missed her wound care follow up visit on 05/07/23 due to not having transportation  Reviewed and discussed with patient her next upcoming scheduled wound visit is set for 05/16/23, she does not have transportation for this appointment  Sent Emergent referral to [id number] to assist with transportation barrier, assist with determining TAMA application status  Reviewed with patient she continues to follow the wound care regimen exactly as directed, she is not able to see the wound, reports the wound continues to be draining and is quiet large Discussed plans with patient for ongoing care coordination follow up and provided patient with direct contact information for nurse care  coordinator Advised patient she will be transferred to Juanell Fairly RN, scheduled nurse call with patient for 05/18/23 @11 :00 AM Sent in basket message to Juanell Fairly RN with a patient status update      To manage lymphedema to bilateral legs/feet       Patient Goals/Self Care Activities: -Patient/Caregiver will take medications as prescribed   -Patient/Caregiver will attend all scheduled provider appointments -Patient/Caregiver will call pharmacy for medication refills 3-7 days in advance of running out of medications -Patient/Caregiver will call provider office for new concerns or questions  -Patient/Caregiver will focus on medication adherence by taking medications as prescribed   Evaluation of current treatment plan related to cellulitis secondary to lymphedema and patient's adherence to plan as established by provider Determined patient missed appointment to lymphedema clinic due to not having transportation patient will call and schedule lymphedema clinica appointment today Follow doctors advise to wrap legs more and use her machine for her legs Reviewed with patient she continues to follow the wound care regimen exactly as directed, she is not able to see the wound, reports the wound continues to be draining  Discussed plans with patient for ongoing care coordination follow up and provided patient with direct contact information care coordinator        Our next appointment is by telephone on 07/03/23 at 130 pm  Please call the care guide team at [phone] if you need to cancel or reschedule your appointment.   If you are experiencing a Mental Health or Behavioral Health Crisis or need someone to talk to,  please call 1-800-273-TALK (toll free, 24 hour hotline)  Patient verbalizes understanding of instructions and care plan provided today and agrees to view in MyChart. Active MyChart status and patient understanding of how to access instructions and care plan via MyChart confirmed with  patient.     Juanell Fairly RN, BSN, [facility]  [facility], [facility]  Care Coordinator Phone: [phone]

## 2023-06-01 NOTE — Patient Outreach (Signed)
 Care Coordination   Follow Up Visit Note   06/01/2023 Name: [name] MRN: [id number] DOB: 1965-03-05  [name] is a 58 y.o. year old female who sees Claiborne Rigg, NP for primary care. I spoke with  [name] by phone today.  What matters to the patients health and wellness today?  During my recent conversation with [name], she reported that her doctor's office had received the necessary paperwork that I sent a message about via Epic. She subsequently visited the [facility] office, where she was informed that she does not qualify for full Medicaid services. Nevertheless, she expressed gratitude for the visit, as it allowed her to renew her existing Medicaid coverage.  Additionally, [name] shared that she has begun attending the lymphedema clinic in [facility], with her second appointment occurring on May 3rd. During this visit, the physician advised her that full recovery would not be possible until some of the swelling is reduced. I have encouraged her to adhere to the physician's recommendations, which include more frequent leg wrapping and increased utilization of a machine designed to massage her legs in order to alleviate swelling.  [name] appears to be quite determined, as her primary objective is to regain the ability to drive. I have assured her of my support throughout this process. Furthermore, I emphasized the importance of monitoring both her dietary and fluid intake.    Goals Addressed             This Visit's Progress    To manage lymphedema to bilateral legs/feet       Patient Goals/Self Care Activities: -Patient/Caregiver will take medications as prescribed   -Patient/Caregiver will attend all scheduled provider appointments -Patient/Caregiver will call pharmacy for medication refills 3-7 days in advance of running out of medications -Patient/Caregiver will call provider office for new concerns or questions  -Patient/Caregiver will  focus on medication adherence by taking medications as prescribed   Evaluation of current treatment plan related to cellulitis secondary to lymphedema and patient's adherence to plan as established by provider Determined patient missed appointment to lymphedema clinic due to not having transportation patient will call and schedule lymphedema clinica appointment today Follow doctors advise to wrap legs more and use her machine for her legs Reviewed with patient she continues to follow the wound care regimen exactly as directed, she is not able to see the wound, reports the wound continues to be draining  Discussed plans with patient for ongoing care coordination follow up and provided patient with direct contact information care coordinator    SDOH assessments and interventions completed:  No     Care Coordination Interventions:  Yes, provided   Interventions Today    Flowsheet Row Most Recent Value  Chronic Disease   Chronic disease during today's visit Other  [Lymphadema]  General Interventions   General Interventions Discussed/Reviewed General Interventions Discussed, General Interventions Reviewed  Doctor Visits Discussed/Reviewed Doctor Visits Discussed, Specialist  PCP/Specialist Visits Compliance with follow-up visit  Education Interventions   Provided Verbal Education On Nutrition  Nutrition Interventions   Nutrition Discussed/Reviewed Nutrition Discussed  Safety Interventions   Safety Discussed/Reviewed Safety Discussed        Follow up plan: Follow up call scheduled for 07/03/23  130 pm    Encounter Outcome:  Patient Visit Completed   Juanell Fairly RN, BSN, [facility] [place]  [facility], [facility]  Care Coordinator Phone: [phone]

## 2023-06-03 ENCOUNTER — Encounter: Payer: Self-pay | Admitting: Nurse Practitioner

## 2023-06-04 DIAGNOSIS — M1990 Unspecified osteoarthritis, unspecified site: Secondary | ICD-10-CM | POA: Diagnosis not present

## 2023-06-04 DIAGNOSIS — D84821 Immunodeficiency due to drugs: Secondary | ICD-10-CM | POA: Diagnosis not present

## 2023-06-04 DIAGNOSIS — R32 Unspecified urinary incontinence: Secondary | ICD-10-CM | POA: Diagnosis not present

## 2023-06-04 DIAGNOSIS — K219 Gastro-esophageal reflux disease without esophagitis: Secondary | ICD-10-CM | POA: Diagnosis not present

## 2023-06-04 DIAGNOSIS — D6861 Antiphospholipid syndrome: Secondary | ICD-10-CM | POA: Diagnosis not present

## 2023-06-04 DIAGNOSIS — F321 Major depressive disorder, single episode, moderate: Secondary | ICD-10-CM | POA: Diagnosis not present

## 2023-06-04 DIAGNOSIS — M0550 Rheumatoid polyneuropathy with rheumatoid arthritis of unspecified site: Secondary | ICD-10-CM | POA: Diagnosis not present

## 2023-06-04 DIAGNOSIS — L97909 Non-pressure chronic ulcer of unspecified part of unspecified lower leg with unspecified severity: Secondary | ICD-10-CM | POA: Diagnosis not present

## 2023-06-04 DIAGNOSIS — Z833 Family history of diabetes mellitus: Secondary | ICD-10-CM | POA: Diagnosis not present

## 2023-06-04 DIAGNOSIS — F419 Anxiety disorder, unspecified: Secondary | ICD-10-CM | POA: Diagnosis not present

## 2023-06-04 DIAGNOSIS — I87319 Chronic venous hypertension (idiopathic) with ulcer of unspecified lower extremity: Secondary | ICD-10-CM | POA: Diagnosis not present

## 2023-06-07 DIAGNOSIS — J45909 Unspecified asthma, uncomplicated: Secondary | ICD-10-CM | POA: Diagnosis not present

## 2023-06-07 DIAGNOSIS — I890 Lymphedema, not elsewhere classified: Secondary | ICD-10-CM | POA: Diagnosis not present

## 2023-06-07 DIAGNOSIS — L819 Disorder of pigmentation, unspecified: Secondary | ICD-10-CM | POA: Diagnosis not present

## 2023-06-07 DIAGNOSIS — I10 Essential (primary) hypertension: Secondary | ICD-10-CM | POA: Diagnosis not present

## 2023-06-07 DIAGNOSIS — Z86718 Personal history of other venous thrombosis and embolism: Secondary | ICD-10-CM | POA: Diagnosis not present

## 2023-06-07 DIAGNOSIS — Z7901 Long term (current) use of anticoagulants: Secondary | ICD-10-CM | POA: Diagnosis not present

## 2023-06-07 DIAGNOSIS — Z79899 Other long term (current) drug therapy: Secondary | ICD-10-CM | POA: Diagnosis not present

## 2023-06-07 DIAGNOSIS — I872 Venous insufficiency (chronic) (peripheral): Secondary | ICD-10-CM | POA: Diagnosis not present

## 2023-06-07 DIAGNOSIS — I83022 Varicose veins of left lower extremity with ulcer of calf: Secondary | ICD-10-CM | POA: Diagnosis not present

## 2023-06-07 DIAGNOSIS — L97222 Non-pressure chronic ulcer of left calf with fat layer exposed: Secondary | ICD-10-CM | POA: Diagnosis not present

## 2023-06-07 DIAGNOSIS — L989 Disorder of the skin and subcutaneous tissue, unspecified: Secondary | ICD-10-CM | POA: Diagnosis not present

## 2023-06-07 DIAGNOSIS — I2699 Other pulmonary embolism without acute cor pulmonale: Secondary | ICD-10-CM | POA: Diagnosis not present

## 2023-06-13 ENCOUNTER — Encounter: Payer: Self-pay | Admitting: Nurse Practitioner

## 2023-06-13 ENCOUNTER — Ambulatory Visit: Payer: Medicare HMO | Attending: Nurse Practitioner | Admitting: Nurse Practitioner

## 2023-06-13 VITALS — BP 117/74 | HR 80 | Resp 19 | Ht 72.0 in | Wt 329.0 lb

## 2023-06-13 DIAGNOSIS — E785 Hyperlipidemia, unspecified: Secondary | ICD-10-CM

## 2023-06-13 DIAGNOSIS — I890 Lymphedema, not elsewhere classified: Secondary | ICD-10-CM

## 2023-06-13 DIAGNOSIS — F32A Depression, unspecified: Secondary | ICD-10-CM

## 2023-06-13 DIAGNOSIS — R7303 Prediabetes: Secondary | ICD-10-CM | POA: Diagnosis not present

## 2023-06-13 DIAGNOSIS — F419 Anxiety disorder, unspecified: Secondary | ICD-10-CM

## 2023-06-13 DIAGNOSIS — D649 Anemia, unspecified: Secondary | ICD-10-CM | POA: Diagnosis not present

## 2023-06-13 DIAGNOSIS — Z86711 Personal history of pulmonary embolism: Secondary | ICD-10-CM

## 2023-06-13 MED ORDER — EZETIMIBE 10 MG PO TABS: 10.0000 mg | ORAL_TABLET | Freq: Every day | ORAL | 3 refills | Status: AC

## 2023-06-13 MED ORDER — APIXABAN 5 MG PO TABS: 5.0000 mg | ORAL_TABLET | Freq: Two times a day (BID) | ORAL | 3 refills | Status: AC

## 2023-06-13 MED ORDER — ESCITALOPRAM OXALATE 10 MG PO TABS: 10.0000 mg | ORAL_TABLET | Freq: Every day | ORAL | 1 refills | Status: DC

## 2023-06-13 NOTE — Progress Notes (Signed)
 Assessment & Plan:  [name] was seen today for depression.  Diagnoses and all orders for this visit:  Anxiety and depression -     Ambulatory referral to Behavioral Health -     escitalopram (LEXAPRO) 10 MG tablet; Take 1 tablet (10 mg total) by mouth daily. For depression  Anemia, unspecified type -     CBC with Differential  Prediabetes -     Hemoglobin A1c    Patient has been counseled on age-appropriate routine health concerns for screening and prevention. These are reviewed and up-to-date. Referrals have been placed accordingly. Immunizations are up-to-date or declined.    Subjective:   Chief Complaint  Patient presents with   Depression    Patient has been confined to apartment and only been out for doctors appointments. Started last month    [name] 58 y.o. female presents to office today for follow up to Depression and anxiety. She states she has been feeling a little better after the possibility of an amputation has lessened. She is currently being followed by the wound care center for venous stasis ulcer of left calf Notes depression due to limited mobility and being confined in her apartment most of the day. She is unable to drive and depends on others for transportation.      06/13/2023   11:34 AM 05/29/2023    4:52 PM 03/14/2023    3:33 PM  Depression screen PHQ 2/9  Decreased Interest 2 1 1   Down, Depressed, Hopeless 1 2 1   PHQ - 2 Score 3 3 2   Altered sleeping 2 3 1   Tired, decreased energy 2 3 1   Change in appetite 2 3 0  Feeling bad or failure about yourself  1 3 0  Trouble concentrating 2 2 0  Moving slowly or fidgety/restless 0 0 0  Suicidal thoughts 1 1 0  PHQ-9 Score 13 18 4   Difficult doing work/chores Somewhat difficult Very difficult        06/13/2023   11:34 AM 03/14/2023    3:33 PM 02/09/2023   12:15 PM 03/06/2022   11:34 AM  GAD 7 : Generalized Anxiety Score  Nervous, Anxious, on Edge 0 0 0 0  Control/stop worrying 0 0 0 0   Worry too much - different things 0 0 1 0  Trouble relaxing 2 0 1 0  Restless 0 0 0 0  Easily annoyed or irritable 0 1 1 0  Afraid - awful might happen 0 0 0 0  Total GAD 7 Score 2 1 3  0  Anxiety Difficulty Somewhat difficult  Not difficult at all       Review of Systems  Constitutional:  Negative for fever, malaise/fatigue and weight loss.  HENT: Negative.  Negative for nosebleeds.   Eyes: Negative.  Negative for blurred vision, double vision and photophobia.  Respiratory: Negative.  Negative for cough and shortness of breath.   Cardiovascular: Negative.  Negative for chest pain, palpitations and leg swelling.  Gastrointestinal: Negative.  Negative for heartburn, nausea and vomiting.  Musculoskeletal: Negative.  Negative for myalgias.  Skin:        lymphedema  Neurological: Negative.  Negative for dizziness, focal weakness, seizures and headaches.  Psychiatric/Behavioral:  Positive for depression. Negative for suicidal ideas. The patient is nervous/anxious.     Past Medical History:  Diagnosis Date   Anemia    Anxiety    Arthritis    Asthma    hx as child - no prob as adult -  no inhaler   Blood transfusion 01/22/11   transfusion 2 units at [facility]   Depression 08/2010   psych assessment   Dyspnea    Fibroid    Headache(784.0)    rx for imitrex - last one jan   Keloid    Lymphedema    Pulmonary embolism [facility])     Past Surgical History:  Procedure Laterality Date   ABDOMINAL HYSTERECTOMY     COLONOSCOPY N/A 11/08/2017   Procedure: COLONOSCOPY;  Surgeon: Alyce Jubilee, MD;  [facility];  Service: Endoscopy;  Laterality: N/A;  2:00pm   FLEXIBLE SIGMOIDOSCOPY N/A 09/17/2017   Procedure: FLEXIBLE SIGMOIDOSCOPY;  Surgeon: Alyce Jubilee, MD;  Location[facility];  Service: Endoscopy;  Laterality: N/A;   HERNIA REPAIR  2009   umbilical   POLYPECTOMY  11/08/2017   Procedure: POLYPECTOMY;  Surgeon: Alyce Jubilee, MD;  Location: [facility];  Service:  Endoscopy;;  ascending colon (CSx1), transverse colon (CS x1), splenic flexure (HSx1)   SVD     Spontaneous vaginal delivery; x 1    Family History  Problem Relation Age of Onset   Heart disease Mother    Hypertension Mother    Hypertension Father    Colon cancer Father 79       Passed away 71 yrs old   Hypertension Sister    Cancer Maternal Grandmother        gastric cancer   Cancer Maternal Grandfather        pancreatic cancer   Colon cancer Paternal Grandmother    Colon cancer Paternal Uncle    Colon polyps Neg Hx     Social History Reviewed with no changes to be made today.   Outpatient Medications Prior to Visit  Medication Sig Dispense Refill   albuterol (VENTOLIN HFA) 108 (90 Base) MCG/ACT inhaler Inhale 2 puffs into the lungs every 6 (six) hours as needed for wheezing or shortness of breath. 18 g 2   apixaban (ELIQUIS) 5 MG TABS tablet Take 1 tablet (5 mg total) by mouth 2 (two) times daily. 180 tablet 3   Certolizumab Pegol (CIMZIA PREFILLED [place]) Inject 250 mg into the skin every 14 (fourteen) days.     Ergocalciferol 50 MCG (2000 UT) TABS Take 2,000 Units by mouth daily.     ezetimibe (ZETIA) 10 MG tablet Take 1 tablet (10 mg total) by mouth daily. For high cholesterol 90 tablet 3   famotidine (PEPCID) 40 MG tablet Take 1 tablet by mouth once daily 90 tablet 0   folic acid (FOLVITE) 1 MG tablet Take 1 tablet by mouth once daily 90 tablet 0   gabapentin (NEURONTIN) 100 MG capsule Take 100 mg by mouth 3 (three) times daily.     loperamide (IMODIUM A-D) 2 MG tablet Take 2 mg by mouth 4 (four) times daily as needed for diarrhea or loose stools.     losartan (COZAAR) 25 MG tablet Take 0.5 tablets (12.5 mg total) by mouth at bedtime. 45 tablet 4   meclizine (ANTIVERT) 12.5 MG tablet Take 1-2 tablets (12.5-25 mg total) by mouth 3 (three) times daily as needed for dizziness. 60 tablet 0   Methotrexate Sodium (METHOTREXATE, PF,) 50 MG/2ML injection Inject into the vein once a  week.     Misc. Devices (BARIATRIC ROLLATOR) MISC Please provide patient with insurance approved bariatric rollator walker. I89.0, R53.81, R26.81 1 each 0   clindamycin (CLEOCIN) 300 MG capsule Take 1 capsule (300 mg total) by mouth  4 (four) times daily. X 7 days (Patient not taking: Reported on 06/13/2023) 28 capsule 0   mupirocin ointment (BACTROBAN) 2 % Apply 1 Application topically daily. (Patient not taking: Reported on 03/14/2023) 22 g 0   triamcinolone ointment (KENALOG) 0.5 % Apply 1 Application topically 2 (two) times daily. (Patient not taking: Reported on 06/13/2023) 30 g 5   No facility-administered medications prior to visit.    Allergies  Allergen Reactions   Abatacept Anaphylaxis    Other reaction(s): Anaphylaxis-Streptomycin Clickject Other reaction(s): Anaphylaxis-Streptomycin   Bee Venom Anaphylaxis   Cefadroxil Diarrhea   Augmentin [Amoxicillin-Pot Clavulanate] Hives, Itching and Other (See Comments)    Did PCN reaction causing immediate rash, facial/tongue/throat swelling, SOB or lightheadedness with hypotension: yes Did PCN reaction causing severe rash involving mucus membranes or skin necrosis: no Has patient had a PCN reaction that required hospitalization: in hospital Has patient had a PCN reaction occurring within the last 10 years: no If all of the above answers are "NO", then may proceed with Cephalosporin use.  Pt reports no recollection of any reactions when taking penicillin in past   Latex Hives and Other (See Comments)    Local reaction (welts). Patient denies any wheezing or other reaction with latex exposure   Nulytely [Peg 3350-Kcl-Na Bicarb-Nacl]     NAUSEA AND VOMITING. MAY TOLERATE LOW VOLUME PREP.       Objective:    BP 117/74 (BP Location: Right Arm, Patient Position: Sitting, Cuff Size: Large)   Pulse 80   Resp 19   Ht 6' (1.829 m)   Wt (!) 329 lb (149.2 kg)   LMP 01/31/2011   SpO2 100%   BMI 44.62 kg/m  Wt Readings from Last 3  Encounters:  06/13/23 (!) 329 lb (149.2 kg)  05/29/23 (!) 350 lb (158.8 kg)  04/05/23 (!) 335 lb (152 kg)    Physical Exam Vitals and nursing note reviewed.  Constitutional:      Appearance: She is well-developed.  HENT:     Head: Normocephalic and atraumatic.  Cardiovascular:     Rate and Rhythm: Normal rate and regular rhythm.     Heart sounds: Normal heart sounds. No murmur heard.    No friction rub. No gallop.  Pulmonary:     Effort: Pulmonary effort is normal. No tachypnea or respiratory distress.     Breath sounds: Normal breath sounds. No decreased breath sounds, wheezing, rhonchi or rales.  Chest:     Chest wall: No tenderness.  Abdominal:     General: Bowel sounds are normal.     Palpations: Abdomen is soft.  Musculoskeletal:        General: Normal range of motion.     Cervical back: Normal range of motion.     Right lower leg: Deformity present.     Left lower leg: Deformity present.     Right ankle: Deformity present.     Left ankle: Deformity present.     Right foot: Deformity present.     Left foot: Deformity present.     Comments: Severe lymphedema  Skin:    General: Skin is warm and dry.  Neurological:     Mental Status: She is alert and oriented to person, place, and time.     Coordination: Coordination normal.  Psychiatric:        Behavior: Behavior normal. Behavior is cooperative.        Thought Content: Thought content normal.        Judgment: Judgment normal.  Patient has been counseled extensively about nutrition and exercise as well as the importance of adherence with medications and regular follow-up. The patient was given clear instructions to go to ER or return to medical center if symptoms don't improve, worsen or new problems develop. The patient verbalized understanding.   Follow-up: Return in about 3 months (around 09/12/2023).   Collins Dean, FNP-BC [facility] [place],  [place] [phone]   06/13/2023, 11:40 AM

## 2023-06-14 ENCOUNTER — Telehealth: Payer: Self-pay

## 2023-06-14 LAB — CBC WITH DIFFERENTIAL/PLATELET
Basophils Absolute: 0 10*3/uL (ref 0.0–0.2)
Basos: 0 %
EOS (ABSOLUTE): 0.2 10*3/uL (ref 0.0–0.4)
Eos: 3 %
Hematocrit: 34.5 % (ref 34.0–46.6)
Hemoglobin: 11.2 g/dL (ref 11.1–15.9)
Immature Grans (Abs): 0 10*3/uL (ref 0.0–0.1)
Immature Granulocytes: 0 %
Lymphocytes Absolute: 1.3 10*3/uL (ref 0.7–3.1)
Lymphs: 21 %
MCH: 28.6 pg (ref 26.6–33.0)
MCHC: 32.5 g/dL (ref 31.5–35.7)
MCV: 88 fL (ref 79–97)
Monocytes Absolute: 0.4 10*3/uL (ref 0.1–0.9)
Monocytes: 7 %
Neutrophils Absolute: 4.2 10*3/uL (ref 1.4–7.0)
Neutrophils: 69 %
Platelets: 399 10*3/uL (ref 150–450)
RBC: 3.91 x10E6/uL (ref 3.77–5.28)
RDW: 13.7 % (ref 11.7–15.4)
WBC: 6.2 10*3/uL (ref 3.4–10.8)

## 2023-06-14 LAB — HEMOGLOBIN A1C
Est. average glucose Bld gHb Est-mCnc: 120 mg/dL
Hgb A1c MFr Bld: 5.8 % — ABNORMAL HIGH (ref 4.8–5.6)

## 2023-06-14 NOTE — Telephone Encounter (Signed)
 Spoke to the patient and she was approved for [facility] [place]

## 2023-06-14 NOTE — Telephone Encounter (Signed)
 Calvin Robinson/ [employer] [facility] stating they have contacted the patient and can see her tomorrow.    Thurston Flow Peele/ [facility] was also able to accept the referral but I had to inform him that another agency has already contacted the patient.  I spoke to the patient and she said she received the call from [facility] and they will be seeing her tomorrow.  I spoke to [facility]/ [facility] and told her to disregard the referral.   Messages sent to [facility], Jake Mayers and [facility] informing them to disregard the referral:

## 2023-06-14 NOTE — Telephone Encounter (Signed)
 Referral receive for home health nursing. I called the patient to inquire if she has a preferred home health agency and she said she did not.  I explained to her that I will contact local home health agencies to see if they will accept the referral.  There is no guarantee that they will accept it because they need to be in network with her insurance company and have staffing available. She said she understood.  Messages sent to the following agencies requesting they review the referral and let me know if they are able to accept it:  [facility]- Brittany Robinson [facility] - Angela Coley [facility] - Lynette Garner [facility] [place] - declined  [facility]- Genna Khan [place]- Leslee Rase   I spoke to [place] / [employer] and she requested the referral be faxed to them for review and they may have staff to see the patient this weekend. Referral faxed to: [phone] as requested   I also spoke to [facility].  They are not able to accept due to staffing.

## 2023-06-15 ENCOUNTER — Telehealth (INDEPENDENT_AMBULATORY_CARE_PROVIDER_SITE_OTHER): Payer: Self-pay

## 2023-06-15 DIAGNOSIS — R7303 Prediabetes: Secondary | ICD-10-CM | POA: Diagnosis not present

## 2023-06-15 DIAGNOSIS — M1990 Unspecified osteoarthritis, unspecified site: Secondary | ICD-10-CM | POA: Diagnosis not present

## 2023-06-15 DIAGNOSIS — E785 Hyperlipidemia, unspecified: Secondary | ICD-10-CM | POA: Diagnosis not present

## 2023-06-15 DIAGNOSIS — I890 Lymphedema, not elsewhere classified: Secondary | ICD-10-CM | POA: Diagnosis not present

## 2023-06-15 DIAGNOSIS — I10 Essential (primary) hypertension: Secondary | ICD-10-CM | POA: Diagnosis not present

## 2023-06-15 DIAGNOSIS — J45909 Unspecified asthma, uncomplicated: Secondary | ICD-10-CM | POA: Diagnosis not present

## 2023-06-15 DIAGNOSIS — F419 Anxiety disorder, unspecified: Secondary | ICD-10-CM | POA: Diagnosis not present

## 2023-06-15 DIAGNOSIS — Z86711 Personal history of pulmonary embolism: Secondary | ICD-10-CM | POA: Diagnosis not present

## 2023-06-15 DIAGNOSIS — I872 Venous insufficiency (chronic) (peripheral): Secondary | ICD-10-CM | POA: Diagnosis not present

## 2023-06-15 DIAGNOSIS — Z7901 Long term (current) use of anticoagulants: Secondary | ICD-10-CM | POA: Diagnosis not present

## 2023-06-15 DIAGNOSIS — F32A Depression, unspecified: Secondary | ICD-10-CM | POA: Diagnosis not present

## 2023-06-15 DIAGNOSIS — L97228 Non-pressure chronic ulcer of left calf with other specified severity: Secondary | ICD-10-CM | POA: Diagnosis not present

## 2023-06-15 DIAGNOSIS — D649 Anemia, unspecified: Secondary | ICD-10-CM | POA: Diagnosis not present

## 2023-06-15 NOTE — Telephone Encounter (Unsigned)
 Copied from CRM [id number]. Topic: Referral - Question >> Jun 15, 2023 12:46 PM [name] wrote: Reason for CRM: [name] with [facility] @ [phone] called and stated that the referral note sent over for pt is missing the face to face notes, nothing is mentioned about the wound care for the patient. [facility] does not have/offer Home Health

## 2023-06-19 ENCOUNTER — Encounter: Payer: Self-pay | Admitting: Nurse Practitioner

## 2023-06-19 NOTE — Telephone Encounter (Signed)
 I spoke to [facility]/ [facility] 06/14/2023 and instructed her to disregard the referral.  Another agency has already accepted

## 2023-06-20 DIAGNOSIS — M1990 Unspecified osteoarthritis, unspecified site: Secondary | ICD-10-CM | POA: Diagnosis not present

## 2023-06-20 DIAGNOSIS — F419 Anxiety disorder, unspecified: Secondary | ICD-10-CM | POA: Diagnosis not present

## 2023-06-20 DIAGNOSIS — D649 Anemia, unspecified: Secondary | ICD-10-CM | POA: Diagnosis not present

## 2023-06-20 DIAGNOSIS — R7303 Prediabetes: Secondary | ICD-10-CM | POA: Diagnosis not present

## 2023-06-20 DIAGNOSIS — Z86711 Personal history of pulmonary embolism: Secondary | ICD-10-CM | POA: Diagnosis not present

## 2023-06-20 DIAGNOSIS — E785 Hyperlipidemia, unspecified: Secondary | ICD-10-CM | POA: Diagnosis not present

## 2023-06-20 DIAGNOSIS — I10 Essential (primary) hypertension: Secondary | ICD-10-CM | POA: Diagnosis not present

## 2023-06-20 DIAGNOSIS — F32A Depression, unspecified: Secondary | ICD-10-CM | POA: Diagnosis not present

## 2023-06-20 DIAGNOSIS — Z7901 Long term (current) use of anticoagulants: Secondary | ICD-10-CM | POA: Diagnosis not present

## 2023-06-20 DIAGNOSIS — I890 Lymphedema, not elsewhere classified: Secondary | ICD-10-CM | POA: Diagnosis not present

## 2023-06-20 DIAGNOSIS — I872 Venous insufficiency (chronic) (peripheral): Secondary | ICD-10-CM | POA: Diagnosis not present

## 2023-06-20 DIAGNOSIS — J45909 Unspecified asthma, uncomplicated: Secondary | ICD-10-CM | POA: Diagnosis not present

## 2023-06-20 DIAGNOSIS — L97228 Non-pressure chronic ulcer of left calf with other specified severity: Secondary | ICD-10-CM | POA: Diagnosis not present

## 2023-06-22 DIAGNOSIS — I890 Lymphedema, not elsewhere classified: Secondary | ICD-10-CM | POA: Diagnosis not present

## 2023-06-22 DIAGNOSIS — I10 Essential (primary) hypertension: Secondary | ICD-10-CM | POA: Diagnosis not present

## 2023-06-22 DIAGNOSIS — D649 Anemia, unspecified: Secondary | ICD-10-CM | POA: Diagnosis not present

## 2023-06-22 DIAGNOSIS — I872 Venous insufficiency (chronic) (peripheral): Secondary | ICD-10-CM | POA: Diagnosis not present

## 2023-06-22 DIAGNOSIS — Z7901 Long term (current) use of anticoagulants: Secondary | ICD-10-CM | POA: Diagnosis not present

## 2023-06-22 DIAGNOSIS — M1990 Unspecified osteoarthritis, unspecified site: Secondary | ICD-10-CM | POA: Diagnosis not present

## 2023-06-22 DIAGNOSIS — L97228 Non-pressure chronic ulcer of left calf with other specified severity: Secondary | ICD-10-CM | POA: Diagnosis not present

## 2023-06-22 DIAGNOSIS — J45909 Unspecified asthma, uncomplicated: Secondary | ICD-10-CM | POA: Diagnosis not present

## 2023-06-22 DIAGNOSIS — E785 Hyperlipidemia, unspecified: Secondary | ICD-10-CM | POA: Diagnosis not present

## 2023-06-22 DIAGNOSIS — F419 Anxiety disorder, unspecified: Secondary | ICD-10-CM | POA: Diagnosis not present

## 2023-06-22 DIAGNOSIS — Z86711 Personal history of pulmonary embolism: Secondary | ICD-10-CM | POA: Diagnosis not present

## 2023-06-22 DIAGNOSIS — R7303 Prediabetes: Secondary | ICD-10-CM | POA: Diagnosis not present

## 2023-06-22 DIAGNOSIS — F32A Depression, unspecified: Secondary | ICD-10-CM | POA: Diagnosis not present

## 2023-06-26 DIAGNOSIS — Z86711 Personal history of pulmonary embolism: Secondary | ICD-10-CM | POA: Diagnosis not present

## 2023-06-26 DIAGNOSIS — L97228 Non-pressure chronic ulcer of left calf with other specified severity: Secondary | ICD-10-CM | POA: Diagnosis not present

## 2023-06-26 DIAGNOSIS — E785 Hyperlipidemia, unspecified: Secondary | ICD-10-CM | POA: Diagnosis not present

## 2023-06-26 DIAGNOSIS — I872 Venous insufficiency (chronic) (peripheral): Secondary | ICD-10-CM | POA: Diagnosis not present

## 2023-06-26 DIAGNOSIS — F419 Anxiety disorder, unspecified: Secondary | ICD-10-CM | POA: Diagnosis not present

## 2023-06-26 DIAGNOSIS — Z7901 Long term (current) use of anticoagulants: Secondary | ICD-10-CM | POA: Diagnosis not present

## 2023-06-26 DIAGNOSIS — D649 Anemia, unspecified: Secondary | ICD-10-CM | POA: Diagnosis not present

## 2023-06-26 DIAGNOSIS — M1990 Unspecified osteoarthritis, unspecified site: Secondary | ICD-10-CM | POA: Diagnosis not present

## 2023-06-26 DIAGNOSIS — I10 Essential (primary) hypertension: Secondary | ICD-10-CM | POA: Diagnosis not present

## 2023-06-26 DIAGNOSIS — J45909 Unspecified asthma, uncomplicated: Secondary | ICD-10-CM | POA: Diagnosis not present

## 2023-06-26 DIAGNOSIS — F32A Depression, unspecified: Secondary | ICD-10-CM | POA: Diagnosis not present

## 2023-06-26 DIAGNOSIS — I890 Lymphedema, not elsewhere classified: Secondary | ICD-10-CM | POA: Diagnosis not present

## 2023-06-26 DIAGNOSIS — R7303 Prediabetes: Secondary | ICD-10-CM | POA: Diagnosis not present

## 2023-06-27 DIAGNOSIS — F419 Anxiety disorder, unspecified: Secondary | ICD-10-CM | POA: Diagnosis not present

## 2023-06-27 DIAGNOSIS — J45909 Unspecified asthma, uncomplicated: Secondary | ICD-10-CM | POA: Diagnosis not present

## 2023-06-27 DIAGNOSIS — I890 Lymphedema, not elsewhere classified: Secondary | ICD-10-CM | POA: Diagnosis not present

## 2023-06-27 DIAGNOSIS — I872 Venous insufficiency (chronic) (peripheral): Secondary | ICD-10-CM | POA: Diagnosis not present

## 2023-06-27 DIAGNOSIS — D649 Anemia, unspecified: Secondary | ICD-10-CM | POA: Diagnosis not present

## 2023-06-27 DIAGNOSIS — L97228 Non-pressure chronic ulcer of left calf with other specified severity: Secondary | ICD-10-CM | POA: Diagnosis not present

## 2023-06-27 DIAGNOSIS — E785 Hyperlipidemia, unspecified: Secondary | ICD-10-CM | POA: Diagnosis not present

## 2023-06-27 DIAGNOSIS — I10 Essential (primary) hypertension: Secondary | ICD-10-CM | POA: Diagnosis not present

## 2023-06-27 DIAGNOSIS — M1990 Unspecified osteoarthritis, unspecified site: Secondary | ICD-10-CM | POA: Diagnosis not present

## 2023-06-27 DIAGNOSIS — R7303 Prediabetes: Secondary | ICD-10-CM | POA: Diagnosis not present

## 2023-06-27 DIAGNOSIS — Z7901 Long term (current) use of anticoagulants: Secondary | ICD-10-CM | POA: Diagnosis not present

## 2023-06-27 DIAGNOSIS — F32A Depression, unspecified: Secondary | ICD-10-CM | POA: Diagnosis not present

## 2023-06-27 DIAGNOSIS — Z86711 Personal history of pulmonary embolism: Secondary | ICD-10-CM | POA: Diagnosis not present

## 2023-06-29 DIAGNOSIS — J45909 Unspecified asthma, uncomplicated: Secondary | ICD-10-CM | POA: Diagnosis not present

## 2023-06-29 DIAGNOSIS — M1990 Unspecified osteoarthritis, unspecified site: Secondary | ICD-10-CM | POA: Diagnosis not present

## 2023-06-29 DIAGNOSIS — E785 Hyperlipidemia, unspecified: Secondary | ICD-10-CM | POA: Diagnosis not present

## 2023-06-29 DIAGNOSIS — L97228 Non-pressure chronic ulcer of left calf with other specified severity: Secondary | ICD-10-CM | POA: Diagnosis not present

## 2023-06-29 DIAGNOSIS — R7303 Prediabetes: Secondary | ICD-10-CM | POA: Diagnosis not present

## 2023-06-29 DIAGNOSIS — I10 Essential (primary) hypertension: Secondary | ICD-10-CM | POA: Diagnosis not present

## 2023-06-29 DIAGNOSIS — D649 Anemia, unspecified: Secondary | ICD-10-CM | POA: Diagnosis not present

## 2023-06-29 DIAGNOSIS — F419 Anxiety disorder, unspecified: Secondary | ICD-10-CM | POA: Diagnosis not present

## 2023-06-29 DIAGNOSIS — Z7901 Long term (current) use of anticoagulants: Secondary | ICD-10-CM | POA: Diagnosis not present

## 2023-06-29 DIAGNOSIS — I890 Lymphedema, not elsewhere classified: Secondary | ICD-10-CM | POA: Diagnosis not present

## 2023-06-29 DIAGNOSIS — I872 Venous insufficiency (chronic) (peripheral): Secondary | ICD-10-CM | POA: Diagnosis not present

## 2023-06-29 DIAGNOSIS — Z86711 Personal history of pulmonary embolism: Secondary | ICD-10-CM | POA: Diagnosis not present

## 2023-06-29 DIAGNOSIS — F32A Depression, unspecified: Secondary | ICD-10-CM | POA: Diagnosis not present

## 2023-07-03 ENCOUNTER — Ambulatory Visit: Payer: Self-pay

## 2023-07-03 DIAGNOSIS — D649 Anemia, unspecified: Secondary | ICD-10-CM | POA: Diagnosis not present

## 2023-07-03 DIAGNOSIS — F32A Depression, unspecified: Secondary | ICD-10-CM | POA: Diagnosis not present

## 2023-07-03 DIAGNOSIS — I890 Lymphedema, not elsewhere classified: Secondary | ICD-10-CM | POA: Diagnosis not present

## 2023-07-03 DIAGNOSIS — I872 Venous insufficiency (chronic) (peripheral): Secondary | ICD-10-CM | POA: Diagnosis not present

## 2023-07-03 DIAGNOSIS — F419 Anxiety disorder, unspecified: Secondary | ICD-10-CM | POA: Diagnosis not present

## 2023-07-03 DIAGNOSIS — Z7901 Long term (current) use of anticoagulants: Secondary | ICD-10-CM | POA: Diagnosis not present

## 2023-07-03 DIAGNOSIS — J45909 Unspecified asthma, uncomplicated: Secondary | ICD-10-CM | POA: Diagnosis not present

## 2023-07-03 DIAGNOSIS — L97228 Non-pressure chronic ulcer of left calf with other specified severity: Secondary | ICD-10-CM | POA: Diagnosis not present

## 2023-07-03 DIAGNOSIS — R7303 Prediabetes: Secondary | ICD-10-CM | POA: Diagnosis not present

## 2023-07-03 DIAGNOSIS — I10 Essential (primary) hypertension: Secondary | ICD-10-CM | POA: Diagnosis not present

## 2023-07-03 DIAGNOSIS — Z86711 Personal history of pulmonary embolism: Secondary | ICD-10-CM | POA: Diagnosis not present

## 2023-07-03 DIAGNOSIS — M1990 Unspecified osteoarthritis, unspecified site: Secondary | ICD-10-CM | POA: Diagnosis not present

## 2023-07-03 DIAGNOSIS — E785 Hyperlipidemia, unspecified: Secondary | ICD-10-CM | POA: Diagnosis not present

## 2023-07-03 NOTE — Patient Outreach (Signed)
 Complex Care Management   Visit Note  07/03/2023  Name:  [name] MRN: [id number] DOB: [birth year]  Situation: Referral received for Complex Care Management related to  Lymphedema  I obtained verbal consent from Patient.  Visit completed with patient  on the phone  Background:   Past Medical History:  Diagnosis Date   Anemia    Anxiety    Arthritis    Asthma    hx as child - no prob as adult - no inhaler   Blood transfusion 01/22/11   transfusion 2 units at [facility]   Depression 08/2010   psych assessment   Dyspnea    Fibroid    Headache(784.0)    rx for imitrex - last one jan   Keloid    Lymphedema    Pulmonary embolism (HCC)     Assessment:  I had a conversation with [name], and I am pleased to report that she is experiencing improvement. She indicated that she now wraps her legs at night and utilizes a machine for massage. Consequently, she has observed a reduction in swelling, and her legs do not feel as heavy as they once did. I expressed my pride in her progress, encouraged her to continue her efforts, and reminded her to adhere to her diet. Additionally, she reported a weight loss of eight pounds. However, she did experience some arthritis pain today, for which she is using heat to alleviate discomfort.      06/13/2023   11:34 AM  Depression screen PHQ 2/9  Decreased Interest 2  Down, Depressed, Hopeless 1  PHQ - 2 Score 3  Altered sleeping 2  Tired, decreased energy 2  Change in appetite 2  Feeling bad or failure about yourself  1  Trouble concentrating 2  Moving slowly or fidgety/restless 0  Suicidal thoughts 1  PHQ-9 Score 13  Difficult doing work/chores Somewhat difficult    There were no vitals filed for this visit.  Medications Reviewed Today     Reviewed by Augustin Leber, RN (Registered Nurse) on 07/03/23 at 1339  Med List Status: <None>   Medication Order Taking? Sig Documenting Provider Last Dose Status Informant  albuterol  (VENTOLIN  HFA)  108 (90 Base) MCG/ACT inhaler [id number] Yes Inhale 2 puffs into the lungs every 6 (six) hours as needed for wheezing or shortness of breath. Collins Dean, NP Taking Active Self, Pharmacy Records  apixaban  (ELIQUIS ) 5 MG TABS tablet [id number] Yes Take 1 tablet (5 mg total) by mouth 2 (two) times daily. Collins Dean, NP Taking Active   Certolizumab Pegol [facility] PREFILLED [place]) [id number] Yes Inject 250 mg into the skin every 14 (fourteen) days. [provider] Taking Active   clindamycin  (CLEOCIN ) 300 MG capsule [id number] No Take 1 capsule (300 mg total) by mouth 4 (four) times daily. X 7 days  Patient not taking: Reported on 05/18/2023   Orvilla Blander, MD Not Taking Active   Ergocalciferol  50 MCG (2000 UT) TABS [id number] Yes Take 2,000 Units by mouth daily. [provider] Taking Active Self, Pharmacy Records  escitalopram  (LEXAPRO ) 10 MG tablet [id number] Yes Take 1 tablet (10 mg total) by mouth daily. For depression Collins Dean, NP Taking Active   ezetimibe  (ZETIA ) 10 MG tablet [id number] Yes Take 1 tablet (10 mg total) by mouth daily. For high cholesterol Collins Dean, NP Taking Active   famotidine  (PEPCID ) 40 MG tablet [id number] Yes Take 1 tablet by mouth once daily Fleming, [name] W, NP Taking Active  folic acid  (FOLVITE ) 1 MG tablet [id number] Yes Take 1 tablet by mouth once daily Newlin, Enobong, MD Taking Active   gabapentin  (NEURONTIN ) 100 MG capsule [id number] Yes Take 100 mg by mouth 3 (three) times daily. [provider] Taking Active   loperamide (IMODIUM A-D) 2 MG tablet [id number] Yes Take 2 mg by mouth 4 (four) times daily as needed for diarrhea or loose stools. [provider] Taking Active   losartan  (COZAAR ) 25 MG tablet [id number] Yes Take 0.5 tablets (12.5 mg total) by mouth at bedtime. Thukkani, Arun K, MD Taking Active   meclizine  (ANTIVERT ) 12.5 MG tablet [id number] Yes Take 1-2 tablets (12.5-25 mg total) by mouth 3 (three) times daily  as needed for dizziness. Collins Dean, NP Taking Active Self, Pharmacy Records  Methotrexate Sodium (METHOTREXATE, PF,) 50 MG/2ML injection [id number] Yes Inject into the vein once a week. [provider] Taking Active   Misc. Devices (BARIATRIC ROLLATOR) MISC [id number] Yes Please provide patient with insurance approved bariatric rollator walker. I89.0, R53.81, R26.81 Collins Dean, NP Taking Active Self, Pharmacy Records            Recommendation:   PCP Follow-up  Follow Up Plan:   Telephone follow up appointment with care management team member scheduled for:  08/03/23  130 pm  Augustin Leber RN, BSN, [facility]  [facility]  Care Coordinator Phone: [phone]

## 2023-07-03 NOTE — Patient Instructions (Signed)
 Visit Information  Thank you for taking time to visit with me today. Please don't hesitate to contact me if I can be of assistance to you before our next scheduled appointment.  Your next care management appointment is  with care management team member scheduled for:  08/03/23  130 pm   Please call the care guide team at [phone] if you need to cancel, schedule, or reschedule an appointment.   Please call 1-800-273-TALK (toll free, 24 hour hotline) if you are experiencing a Mental Health or Behavioral Health Crisis or need someone to talk to.  Augustin Leber RN, BSN, [facility]  [facility], [facility]  Care Coordinator Phone: [phone]

## 2023-07-06 DIAGNOSIS — I872 Venous insufficiency (chronic) (peripheral): Secondary | ICD-10-CM | POA: Diagnosis not present

## 2023-07-06 DIAGNOSIS — F32A Depression, unspecified: Secondary | ICD-10-CM | POA: Diagnosis not present

## 2023-07-06 DIAGNOSIS — J45909 Unspecified asthma, uncomplicated: Secondary | ICD-10-CM | POA: Diagnosis not present

## 2023-07-06 DIAGNOSIS — M1990 Unspecified osteoarthritis, unspecified site: Secondary | ICD-10-CM | POA: Diagnosis not present

## 2023-07-06 DIAGNOSIS — R7303 Prediabetes: Secondary | ICD-10-CM | POA: Diagnosis not present

## 2023-07-06 DIAGNOSIS — F419 Anxiety disorder, unspecified: Secondary | ICD-10-CM | POA: Diagnosis not present

## 2023-07-06 DIAGNOSIS — D649 Anemia, unspecified: Secondary | ICD-10-CM | POA: Diagnosis not present

## 2023-07-06 DIAGNOSIS — E785 Hyperlipidemia, unspecified: Secondary | ICD-10-CM | POA: Diagnosis not present

## 2023-07-06 DIAGNOSIS — Z7901 Long term (current) use of anticoagulants: Secondary | ICD-10-CM | POA: Diagnosis not present

## 2023-07-06 DIAGNOSIS — I890 Lymphedema, not elsewhere classified: Secondary | ICD-10-CM | POA: Diagnosis not present

## 2023-07-06 DIAGNOSIS — Z86711 Personal history of pulmonary embolism: Secondary | ICD-10-CM | POA: Diagnosis not present

## 2023-07-06 DIAGNOSIS — L97228 Non-pressure chronic ulcer of left calf with other specified severity: Secondary | ICD-10-CM | POA: Diagnosis not present

## 2023-07-06 DIAGNOSIS — I10 Essential (primary) hypertension: Secondary | ICD-10-CM | POA: Diagnosis not present

## 2023-07-09 ENCOUNTER — Other Ambulatory Visit: Payer: Self-pay

## 2023-07-09 ENCOUNTER — Emergency Department (HOSPITAL_COMMUNITY)

## 2023-07-09 ENCOUNTER — Emergency Department (HOSPITAL_COMMUNITY)
Admission: EM | Admit: 2023-07-09 | Discharge: 2023-07-10 | Disposition: A | Discharge status: Home / Self Care | Attending: Emergency Medicine | Admitting: Emergency Medicine

## 2023-07-09 ENCOUNTER — Ambulatory Visit: Payer: Self-pay

## 2023-07-09 DIAGNOSIS — R1013 Epigastric pain: Secondary | ICD-10-CM | POA: Insufficient documentation

## 2023-07-09 DIAGNOSIS — Y9301 Activity, walking, marching and hiking: Secondary | ICD-10-CM | POA: Insufficient documentation

## 2023-07-09 DIAGNOSIS — L97228 Non-pressure chronic ulcer of left calf with other specified severity: Secondary | ICD-10-CM | POA: Diagnosis not present

## 2023-07-09 DIAGNOSIS — K439 Ventral hernia without obstruction or gangrene: Secondary | ICD-10-CM | POA: Diagnosis not present

## 2023-07-09 DIAGNOSIS — L910 Hypertrophic scar: Secondary | ICD-10-CM | POA: Insufficient documentation

## 2023-07-09 DIAGNOSIS — I890 Lymphedema, not elsewhere classified: Secondary | ICD-10-CM | POA: Diagnosis not present

## 2023-07-09 DIAGNOSIS — I872 Venous insufficiency (chronic) (peripheral): Secondary | ICD-10-CM | POA: Diagnosis not present

## 2023-07-09 DIAGNOSIS — F32A Depression, unspecified: Secondary | ICD-10-CM | POA: Diagnosis not present

## 2023-07-09 DIAGNOSIS — W19XXXA Unspecified fall, initial encounter: Secondary | ICD-10-CM

## 2023-07-09 DIAGNOSIS — Z7901 Long term (current) use of anticoagulants: Secondary | ICD-10-CM | POA: Diagnosis not present

## 2023-07-09 DIAGNOSIS — M25519 Pain in unspecified shoulder: Secondary | ICD-10-CM | POA: Diagnosis not present

## 2023-07-09 DIAGNOSIS — Z79899 Other long term (current) drug therapy: Secondary | ICD-10-CM | POA: Diagnosis not present

## 2023-07-09 DIAGNOSIS — Z9989 Dependence on other enabling machines and devices: Secondary | ICD-10-CM | POA: Insufficient documentation

## 2023-07-09 DIAGNOSIS — M25511 Pain in right shoulder: Secondary | ICD-10-CM | POA: Diagnosis not present

## 2023-07-09 DIAGNOSIS — I10 Essential (primary) hypertension: Secondary | ICD-10-CM | POA: Insufficient documentation

## 2023-07-09 DIAGNOSIS — R1011 Right upper quadrant pain: Secondary | ICD-10-CM | POA: Diagnosis not present

## 2023-07-09 DIAGNOSIS — F419 Anxiety disorder, unspecified: Secondary | ICD-10-CM | POA: Diagnosis not present

## 2023-07-09 DIAGNOSIS — Z86711 Personal history of pulmonary embolism: Secondary | ICD-10-CM | POA: Diagnosis not present

## 2023-07-09 DIAGNOSIS — W010XXA Fall on same level from slipping, tripping and stumbling without subsequent striking against object, initial encounter: Secondary | ICD-10-CM | POA: Diagnosis not present

## 2023-07-09 DIAGNOSIS — E785 Hyperlipidemia, unspecified: Secondary | ICD-10-CM | POA: Diagnosis not present

## 2023-07-09 DIAGNOSIS — R7303 Prediabetes: Secondary | ICD-10-CM | POA: Diagnosis not present

## 2023-07-09 DIAGNOSIS — Z91040 Latex allergy status: Secondary | ICD-10-CM | POA: Insufficient documentation

## 2023-07-09 DIAGNOSIS — J45909 Unspecified asthma, uncomplicated: Secondary | ICD-10-CM | POA: Diagnosis not present

## 2023-07-09 DIAGNOSIS — R591 Generalized enlarged lymph nodes: Secondary | ICD-10-CM | POA: Diagnosis not present

## 2023-07-09 DIAGNOSIS — D649 Anemia, unspecified: Secondary | ICD-10-CM | POA: Diagnosis not present

## 2023-07-09 DIAGNOSIS — M1990 Unspecified osteoarthritis, unspecified site: Secondary | ICD-10-CM | POA: Diagnosis not present

## 2023-07-09 DIAGNOSIS — R109 Unspecified abdominal pain: Secondary | ICD-10-CM | POA: Diagnosis not present

## 2023-07-09 LAB — CBC WITH DIFFERENTIAL/PLATELET
Abs Immature Granulocytes: 0 10*3/uL (ref 0.00–0.07)
Basophils Absolute: 0 10*3/uL (ref 0.0–0.1)
Basophils Relative: 0 %
Eosinophils Absolute: 0.2 10*3/uL (ref 0.0–0.5)
Eosinophils Relative: 5 %
HCT: 35.4 % — ABNORMAL LOW (ref 36.0–46.0)
Hemoglobin: 10.6 g/dL — ABNORMAL LOW (ref 12.0–15.0)
Immature Granulocytes: 0 %
Lymphocytes Relative: 26 %
Lymphs Abs: 1 10*3/uL (ref 0.7–4.0)
MCH: 27.8 pg (ref 26.0–34.0)
MCHC: 29.9 g/dL — ABNORMAL LOW (ref 30.0–36.0)
MCV: 92.9 fL (ref 80.0–100.0)
Monocytes Absolute: 0.5 10*3/uL (ref 0.1–1.0)
Monocytes Relative: 14 %
Neutro Abs: 2.1 10*3/uL (ref 1.7–7.7)
Neutrophils Relative %: 55 %
Platelets: 275 10*3/uL (ref 150–400)
RBC: 3.81 MIL/uL — ABNORMAL LOW (ref 3.87–5.11)
RDW: 16.1 % — ABNORMAL HIGH (ref 11.5–15.5)
WBC: 3.8 10*3/uL — ABNORMAL LOW (ref 4.0–10.5)
nRBC: 0 % (ref 0.0–0.2)

## 2023-07-09 LAB — COMPREHENSIVE METABOLIC PANEL WITH GFR
ALT: 10 U/L (ref 0–44)
AST: 17 U/L (ref 15–41)
Albumin: 3.4 g/dL — ABNORMAL LOW (ref 3.5–5.0)
Alkaline Phosphatase: 50 U/L (ref 38–126)
Anion gap: 6 (ref 5–15)
BUN: 11 mg/dL (ref 6–20)
CO2: 25 mmol/L (ref 22–32)
Calcium: 9.6 mg/dL (ref 8.9–10.3)
Chloride: 109 mmol/L (ref 98–111)
Creatinine, Ser: 0.87 mg/dL (ref 0.44–1.00)
GFR, Estimated: >60 mL/min (ref >60)
Glucose, Bld: 95 mg/dL (ref 70–99)
Potassium: 4.1 mmol/L (ref 3.5–5.1)
Sodium: 140 mmol/L (ref 135–145)
Total Bilirubin: 0.9 mg/dL (ref 0.0–1.2)
Total Protein: 8 g/dL (ref 6.5–8.1)

## 2023-07-09 NOTE — ED Provider Notes (Signed)
 [facility] EMERGENC[facility] DEPARTMENT AT [facility] Provider Note   CSN: [id number] Arrival date & time: 07/09/23  1535     History  Chief Complaint  Patient presents with   Coleridge [name]    [name] is a 58 y.o. female.  The history is provided by the patient and medical records.  Fall Associated symptoms include abdominal pain.   58 year old female with history of hypertension, anemia, chronic fatigue, history of PE on Eliquis , rheumatoid arthritis, presenting to the ED after a fall.  Patient states she was walking today with her walker which is baseline.  She attempted to lean over to move a box that was in her way and she leaned too far toppling over on top of her walker.  The top bar jammed into her abdomen.  There was no head injury or loss of consciousness.  States she had a lot of severe abdominal pain for quite a while after the fall.  She did talk with her PCP office and they encouraged her to come in for evaluation given she is anticoagulated.  She reports some brief nausea but that seems to have resolved.  She has been able to urinate since without issue.  She has not had any vomiting.  Home Medications Prior to Admission medications   Medication Sig Start Date End Date Taking? Authorizing Provider  albuterol  (VENTOLIN  HFA) 108 (90 Base) MCG/ACT inhaler Inhale 2 puffs into the lungs every 6 (six) hours as needed for wheezing or shortness of breath. 07/12/20   Fleming, [name] W, NP  apixaban  (ELIQUIS ) 5 MG TABS tablet Take 1 tablet (5 mg total) by mouth 2 (two) times daily. 06/13/23   Fleming, [name] W, NP  Certolizumab Pegol [facility] PREFILLED [facility]) Inject 250 mg into the skin every 14 (fourteen) days.    [provider]  clindamycin  (CLEOCIN ) 300 MG capsule Take 1 capsule (300 mg total) by mouth 4 (four) times daily. X 7 days Patient not taking: Reported on 05/18/2023 04/27/23   Orvilla Blander, MD  Ergocalciferol  50 MCG (2000 UT) TABS Take 2,000 Units by mouth daily.     [provider]  escitalopram  (LEXAPRO ) 10 MG tablet Take 1 tablet (10 mg total) by mouth daily. For depression 06/13/23   Fleming, [name] W, NP  ezetimibe  (ZETIA ) 10 MG tablet Take 1 tablet (10 mg total) by mouth daily. For high cholesterol 06/13/23   Fleming, [name] W, NP  famotidine  (PEPCID ) 40 MG tablet Take 1 tablet by mouth once daily 04/20/23   Fleming, [name] W, NP  folic acid  (FOLVITE ) 1 MG tablet Take 1 tablet by mouth once daily 04/23/23   Newlin, Enobong, MD  gabapentin  (NEURONTIN ) 100 MG capsule Take 100 mg by mouth 3 (three) times daily. 05/31/23 05/30/24  [provider]  loperamide (IMODIUM A-D) 2 MG tablet Take 2 mg by mouth 4 (four) times daily as needed for diarrhea or loose stools.    [provider]  losartan  (COZAAR ) 25 MG tablet Take 0.5 tablets (12.5 mg total) by mouth at bedtime. 12/01/22   Thukkani, Arun K, MD  meclizine  (ANTIVERT ) 12.5 MG tablet Take 1-2 tablets (12.5-25 mg total) by mouth 3 (three) times daily as needed for dizziness. 10/11/22   Fleming, [name] W, NP  Methotrexate Sodium (METHOTREXATE, PF,) 50 MG/2ML injection Inject into the vein once a week.    [provider]  Misc. Devices (BARIATRIC ROLLATOR) MISC Please provide patient with insurance approved bariatric rollator walker. I89.0, R53.81, R26.81 11/05/19  Collins Dean, NP      Allergies    Abatacept, Bee venom, Cefadroxil , Augmentin  [amoxicillin -pot clavulanate], Latex, and Nulytely  [peg 3350 -kcl-na bicarb-nacl]    Review of Systems   Review of Systems  Gastrointestinal:  Positive for abdominal pain and nausea (resolved).  All other systems reviewed and are negative.   Physical Exam Updated Vital Signs BP (!) 152/78   Pulse 85   Temp 97.6 F (36.4 C) (Oral)   Resp 17   Wt (!) 149 kg   LMP 01/31/2011   SpO2 100%   BMI 44.55 kg/m   Physical Exam Vitals and nursing note reviewed.  Constitutional:      Appearance: She is well-developed.  HENT:     Head:  Normocephalic and atraumatic.     Comments: No visible head trauma Eyes:     Conjunctiva/sclera: Conjunctivae normal.     Pupils: Pupils are equal, round, and reactive to light.  Neck:     Comments: Keloid right side of neck, normal ROM without pain Cardiovascular:     Rate and Rhythm: Normal rate and regular rhythm.     Heart sounds: Normal heart sounds.  Pulmonary:     Effort: Pulmonary effort is normal.     Breath sounds: Normal breath sounds.  Abdominal:     General: Bowel sounds are normal.     Palpations: Abdomen is soft.     Tenderness: There is abdominal tenderness.     Comments: Multiple keloid scars noted to abdominal wall, tender epigastrium and a little into RUQ, no rigidity, no obvious bruising  Musculoskeletal:        General: Normal range of motion.     Cervical back: Normal range of motion.     Comments: No deformities of right shoulder, arm is NVI  Skin:    General: Skin is warm and dry.  Neurological:     Mental Status: She is alert and oriented to person, place, and time.     ED Results / Procedures / Treatments   Labs (all labs ordered are listed, but only abnormal results are displayed) Labs Reviewed  CBC WITH DIFFERENTIAL/PLATELET - Abnormal; Notable for the following components:      Result Value   WBC 3.8 (*)    RBC 3.81 (*)    Hemoglobin 10.6 (*)    HCT 35.4 (*)    MCHC 29.9 (*)    RDW 16.1 (*)    All other components within normal limits  COMPREHENSIVE METABOLIC PANEL WITH GFR - Abnormal; Notable for the following components:   Albumin 3.4 (*)    All other components within normal limits    EKG None  Radiology CT ABDOMEN PELVIS W CONTRAST Result Date: 07/10/2023 CLINICAL DATA:  Status post fall from standing. Abdominal right shoulder pain. Chronic anticoagulation. EXAM: CT ABDOMEN AND PELVIS WITH CONTRAST TECHNIQUE: Multidetector CT imaging of the abdomen and pelvis was performed using the standard protocol following bolus administration  of intravenous contrast. RADIATION DOSE REDUCTION: This exam was performed according to the departmental dose-optimization program which includes automated exposure control, adjustment of the mA and/or kV according to patient size and/or use of iterative reconstruction technique. CONTRAST:  75mL OMNIPAQUE  IOHEXOL  350 MG/ML SOLN COMPARISON:  05/06/2018.  Chest CTA 04/07/2021 and 12/31/2018 FINDINGS: Lower chest: Peripheral small airway impaction noted dependent left lower lobe. 10 mm nodular opacity on image 18/5 is stable since 05/06/2018 consistent with benign etiology such as scarring. Hepatobiliary: Scattered tiny hypodensities in the liver parenchyma are  too small to characterize but are statistically most likely benign. No followup imaging is recommended. There is no evidence for gallstones, gallbladder wall thickening, or pericholecystic fluid. No intrahepatic or extrahepatic biliary dilation. Pancreas: No focal mass lesion. No dilatation of the main duct. No intraparenchymal cyst. No peripancreatic edema. Spleen: No splenomegaly. No suspicious focal mass lesion. Adrenals/Urinary Tract: 2.2 cm left adrenal nodule cannot be characterized based on washout characteristics. This is stable since a chest CTA of 04/07/2021 and measured about 1.9 cm on a chest CT of 12/31/2018, most consistent with benign etiology such is adenoma. Patient also has a 1.6 cm nodule in the right adrenal gland, stable since 12/31/2018, also consistent with benign etiology such as adenoma. Tiny well-defined homogeneous low-density lesion in the right kidney is too small to characterize but statistically most likely benign and probably a cyst. No followup imaging is recommended. Left kidney unremarkable. No evidence for hydroureter. The urinary bladder appears normal for the degree of distention. Stomach/Bowel: Stomach is unremarkable. No gastric wall thickening. No evidence of outlet obstruction. Duodenum is normally positioned as is the  ligament of Treitz. No small bowel wall thickening. No small bowel dilatation. The terminal ileum is normal. The appendix is normal. No gross colonic mass. No colonic wall thickening. Vascular/Lymphatic: No abdominal aortic aneurysm. No abdominal aortic atherosclerotic calcification. There is no gastrohepatic or hepatoduodenal ligament lymphadenopathy. No retroperitoneal or mesenteric lymphadenopathy. Left pelvic sidewall lymphadenopathy evident including 2.3 cm short axis left external iliac node on 63/3 and 1.6 cm short axis left iliac node on 64/3. Both these are stable since 05/06/2018. Similar enlarged lymph nodes are seen along the right pelvic sidewall. Mild lymphadenopathy in the groin regions bilaterally is also unchanged in the interval. Reproductive: Hysterectomy.  There is no adnexal mass. Other: No intraperitoneal free fluid. Musculoskeletal: No worrisome lytic or sclerotic osseous abnormality. Status post ventral abdominal wall hernia repair with mesh. Recurrent hernia along the caudal margin of the mesh contains only fat with hernia sac measuring approximately 4.1 x 2.4 x 4.9 cm. No edema or fluid in the hernia sac to suggest fat incarceration. Degenerative disc disease noted L5-S1. IMPRESSION: 1. No acute findings in the abdomen or pelvis. Specifically, no findings to explain the patient's history of abdominal pain. 2. Status post ventral abdominal wall hernia repair with mesh. Recurrent hernia along the caudal margin of the mesh contains only fat with hernia sac measuring approximately 4.1 x 2.4 x 4.9 cm. No edema or fluid in the hernia sac to suggest fat incarceration. 3. Bilateral adrenal nodules, stable since 12/31/2018, most consistent with benign etiology such as adenomas. No followup imaging is recommended. 4. Bilateral pelvic sidewall and groin lymphadenopathy, stable since 05/06/2018. This is a nonspecific finding and while the size of these lymph nodes is concerning for lymphoproliferative  disease, the long-term interval stability is reassuring for benign etiology. Patient has been reported to have axillary lymphadenopathy on previous chest CTs. 5. Peripheral small airway impaction dependent left lower lobe. Imaging features can be seen in the setting of bronchitis. Electronically Signed   By: Donnal Fusi M.D.   On: 07/10/2023 05:22   DG Shoulder Right Result Date: 07/09/2023 CLINICAL DATA:  Pain after fall. EXAM: RIGHT SHOULDER - 2+ VIEW COMPARISON:  None Available. FINDINGS: There is no evidence of fracture or dislocation. The alignment and joint spaces are normal. Minor acromioclavicular spurring. Soft tissues are unremarkable. IMPRESSION: No fracture or dislocation of the right shoulder. Electronically Signed   By: Chadwick Colonel  M.D.   On: 07/09/2023 17:09    Procedures Procedures    Medications Ordered in ED Medications - No data to display  ED Course/ Medical Decision Making/ A&P                                 Medical Decision Making Amount and/or Complexity of Data Reviewed Radiology: ordered and independent interpretation performed. ECG/medicine tests: ordered and independent interpretation performed.  Risk Prescription drug management.    58 year old female presenting to the ED after a fall.  She was using her walker and bent over to move a box but leaned too far and fell into her walker which jammed into her abdomen.  There was no head injury or loss of consciousness.  She is currently on Eliquis .  States she called her PCP as she was having some abdominal discomfort and was sent in for further evaluation.  She is awake, alert, oriented here.  She is hemodynamically stable.  No visible head trauma.  Has some keloids to the abdominal wall but is not have any overt bruising.  Mildly tender epigastrium and right upper quadrant without peritoneal signs.  No rigidity or guarding.  Labs were obtained and are grossly reassuring, hemoglobin 10.6 which appears  baseline.  No significant electrolyte derangement.  Shoulder films are negative.  Will obtain CT.  Delays in CT-- first IV blew with contrast administration.  Attempting again.    CT without acute traumatic findings.  Does have a lot of chronic findings-- hernia, adrenal nodules, pelvic lymphadenopathy, etc.  All appears stable compared with prior.  Patient remains HD stable here.  Seems appropriate for discharge.  Will have her monitor for development of bruising to the abdominal wall, blood in the stool, etc.  Can follow-up with PCP.  Return here for new concerns.  Final Clinical Impression(s) / ED Diagnoses Final diagnoses:  Fall, initial encounter    Rx / DC Orders ED Discharge Orders     None         [place] Dew, PA-C 07/10/23 1610    Earma Gloss, MD 07/24/23 2237

## 2023-07-09 NOTE — ED Notes (Signed)
 Patient moved to bed 30, taken to restroom.

## 2023-07-09 NOTE — ED Provider Triage Note (Signed)
 Emergency Medicine Provider Triage Evaluation Note  [name] , a 58 y.o. female  was evaluated in triage.  Pt complains of ground-level fall onto her walker.  Complains of abdominal pain and right shoulder pain.  No vomiting Review of Systems  Positive:  Negative:   Physical Exam  BP 130/78 (BP Location: Right Arm)   Pulse 60   Temp 99 F (37.2 C) (Oral)   Resp 19   Wt (!) 149 kg   LMP 01/31/2011   SpO2 99%   BMI 44.55 kg/m  Gen:   Awake, no distress   Resp:  Normal effort  MSK:   Moves extremities without difficulty, right shoulder tenderness Other:  Abdomen generally tender  Medical Decision Making  Medically screening exam initiated at 4:02 PM.  Appropriate orders placed.  JANY SLOBODA was informed that the remainder of the evaluation will be completed by another provider, this initial triage assessment does not replace that evaluation, and the importance of remaining in the ED until their evaluation is complete.  Will obtain plain films of shoulder, suitable for waiting room   Felicie Horning, PA-C 07/09/23 1603

## 2023-07-09 NOTE — ED Notes (Signed)
 ED Provider at bedside.

## 2023-07-09 NOTE — ED Triage Notes (Signed)
 BIB EMS from home mechanical fall while walking with walker. Walker slipped and pt fell forward landed on walker complains of abd pain and right shoulder pain. Denies hitting head. No loc. Is on Eliquis . PCP advised to come to ER.   VS  158/80 66 pulse 16 rr 98% RA  Cbg 117

## 2023-07-09 NOTE — Telephone Encounter (Signed)
 Copied from CRM [id number]. Topic: Clinical - Red Word Triage >> Jul 09, 2023  2:28 PM [name] wrote: [facility] that prompted transfer to Nurse Triage: : [name] w/Home Health Patient fell 30 min ago and in pain   Chief Complaint: Fall Symptoms: Abdominal pain, nausea  Frequency: Single fall Pertinent Negatives: Patient denies hitting head  Disposition: [x] ED /[] Urgent Care (no appt availability in office) / [] Appointment(In office/virtual)/ []  [place] [facility]/ [] Home Care/ [] Refused Recommended Disposition /[] [place] [facility]/ []  Follow-up with PCP Additional Notes: [name] with [facility] called to report that the patient informed him that she fell about 30 minutes before he got there. He states that she lost her balance reaching for something and fell forward, injuring her abdomen on her walker. He states that she is complaining of 9/10 pain to her abdomen as well as nausea. He states she did not hit her head. I advised that with her pain and with some of her medication it would be best to have her evaluated at the ED to rule out any internal injuries. He udnerstood and is agreeabel with this plan.      Reason for Disposition  Injury (or injuries) that need emergency care  Answer Assessment - Initial Assessment Questions 1. MECHANISM: "How did the fall happen?"     Lost balance  2. DOMESTIC VIOLENCE AND ELDER ABUSE SCREENING: "Did you fall because someone pushed you or tried to hurt you?" If Yes, ask: "Are you safe now?"     No 3. ONSET: "When did the fall happen?" (e.[name]., minutes, hours, or days ago)     30 minutes ago  4. LOCATION: "What part of the body hit the ground?" (e.[name]., back, buttocks, head, hips, knees, hands, head, stomach)     Midsection hit device on her 4 wheeled walker  5. INJURY: "Did you hurt (injure) yourself when you fell?" If Yes, ask: "What did you injure? Tell me more about this?" (e.[name]., body area; type of injury; pain severity)"     Abdomen  6.  PAIN: "Is there any pain?" If Yes, ask: "How bad is the pain?" (e.[name]., Scale 1-10; or mild,  moderate, severe)   - NONE (0): No pain   - MILD (1-3): Doesn't interfere with normal activities    - MODERATE (4-7): Interferes with normal activities or awakens from sleep    - SEVERE (8-10): Excruciating pain, unable to do any normal activities      9/10 7. SIZE: For cuts, bruises, or swelling, ask: "How large is it?" (e.[name]., inches or centimeters)      No 8. PREGNANCY: "Is there any chance you are pregnant?" "When was your last menstrual period?"     No 9. OTHER SYMPTOMS: "Do you have any other symptoms?" (e.[name]., dizziness, fever, weakness; new onset or worsening).      Nausea  10. CAUSE: "What do you think caused the fall (or falling)?" (e.[name]., tripped, dizzy spell)       Lost balance  Protocols used: Falls and [facility]

## 2023-07-09 NOTE — Telephone Encounter (Signed)
 Noted. Patient is agreeable to go to ED for assessment.

## 2023-07-10 ENCOUNTER — Emergency Department (HOSPITAL_COMMUNITY)

## 2023-07-10 DIAGNOSIS — Z86711 Personal history of pulmonary embolism: Secondary | ICD-10-CM | POA: Diagnosis not present

## 2023-07-10 DIAGNOSIS — I872 Venous insufficiency (chronic) (peripheral): Secondary | ICD-10-CM | POA: Diagnosis not present

## 2023-07-10 DIAGNOSIS — F419 Anxiety disorder, unspecified: Secondary | ICD-10-CM | POA: Diagnosis not present

## 2023-07-10 DIAGNOSIS — I890 Lymphedema, not elsewhere classified: Secondary | ICD-10-CM | POA: Diagnosis not present

## 2023-07-10 DIAGNOSIS — E785 Hyperlipidemia, unspecified: Secondary | ICD-10-CM | POA: Diagnosis not present

## 2023-07-10 DIAGNOSIS — M1990 Unspecified osteoarthritis, unspecified site: Secondary | ICD-10-CM | POA: Diagnosis not present

## 2023-07-10 DIAGNOSIS — I10 Essential (primary) hypertension: Secondary | ICD-10-CM | POA: Diagnosis not present

## 2023-07-10 DIAGNOSIS — Z7901 Long term (current) use of anticoagulants: Secondary | ICD-10-CM | POA: Diagnosis not present

## 2023-07-10 DIAGNOSIS — R591 Generalized enlarged lymph nodes: Secondary | ICD-10-CM | POA: Diagnosis not present

## 2023-07-10 DIAGNOSIS — L97228 Non-pressure chronic ulcer of left calf with other specified severity: Secondary | ICD-10-CM | POA: Diagnosis not present

## 2023-07-10 DIAGNOSIS — F32A Depression, unspecified: Secondary | ICD-10-CM | POA: Diagnosis not present

## 2023-07-10 DIAGNOSIS — D649 Anemia, unspecified: Secondary | ICD-10-CM | POA: Diagnosis not present

## 2023-07-10 DIAGNOSIS — R7303 Prediabetes: Secondary | ICD-10-CM | POA: Diagnosis not present

## 2023-07-10 DIAGNOSIS — K439 Ventral hernia without obstruction or gangrene: Secondary | ICD-10-CM | POA: Diagnosis not present

## 2023-07-10 DIAGNOSIS — J45909 Unspecified asthma, uncomplicated: Secondary | ICD-10-CM | POA: Diagnosis not present

## 2023-07-10 MED ORDER — IOHEXOL 350 MG/ML SOLN
75.0000 mL | Freq: Once | INTRAVENOUS | Status: AC | PRN
  Administered 2023-07-10: 75 mL via INTRAVENOUS

## 2023-07-10 NOTE — ED Notes (Signed)
 Patient transported to CT

## 2023-07-10 NOTE — ED Notes (Signed)
 Patient discharged in stable condition, education materials explained including, follow up, any prescriptions and reasons to return. Patient voiced agreement to education and discharge material.

## 2023-07-10 NOTE — Discharge Instructions (Signed)
 CT today did not show any acute traumatic findings.  Shoulder films were also normal.   Please monitor for any development of bruising along the abdominal wall, blood in the stool, etc. Follow-up with your primary care doctor. Return here for new concerns.

## 2023-07-13 DIAGNOSIS — Z86711 Personal history of pulmonary embolism: Secondary | ICD-10-CM | POA: Diagnosis not present

## 2023-07-13 DIAGNOSIS — M1990 Unspecified osteoarthritis, unspecified site: Secondary | ICD-10-CM | POA: Diagnosis not present

## 2023-07-13 DIAGNOSIS — F32A Depression, unspecified: Secondary | ICD-10-CM | POA: Diagnosis not present

## 2023-07-13 DIAGNOSIS — L97228 Non-pressure chronic ulcer of left calf with other specified severity: Secondary | ICD-10-CM | POA: Diagnosis not present

## 2023-07-13 DIAGNOSIS — J45909 Unspecified asthma, uncomplicated: Secondary | ICD-10-CM | POA: Diagnosis not present

## 2023-07-13 DIAGNOSIS — D649 Anemia, unspecified: Secondary | ICD-10-CM | POA: Diagnosis not present

## 2023-07-13 DIAGNOSIS — Z7901 Long term (current) use of anticoagulants: Secondary | ICD-10-CM | POA: Diagnosis not present

## 2023-07-13 DIAGNOSIS — I10 Essential (primary) hypertension: Secondary | ICD-10-CM | POA: Diagnosis not present

## 2023-07-13 DIAGNOSIS — R7303 Prediabetes: Secondary | ICD-10-CM | POA: Diagnosis not present

## 2023-07-13 DIAGNOSIS — F419 Anxiety disorder, unspecified: Secondary | ICD-10-CM | POA: Diagnosis not present

## 2023-07-13 DIAGNOSIS — I872 Venous insufficiency (chronic) (peripheral): Secondary | ICD-10-CM | POA: Diagnosis not present

## 2023-07-13 DIAGNOSIS — E785 Hyperlipidemia, unspecified: Secondary | ICD-10-CM | POA: Diagnosis not present

## 2023-07-13 DIAGNOSIS — I890 Lymphedema, not elsewhere classified: Secondary | ICD-10-CM | POA: Diagnosis not present

## 2023-07-17 DIAGNOSIS — I890 Lymphedema, not elsewhere classified: Secondary | ICD-10-CM | POA: Diagnosis not present

## 2023-07-17 DIAGNOSIS — Z7901 Long term (current) use of anticoagulants: Secondary | ICD-10-CM | POA: Diagnosis not present

## 2023-07-17 DIAGNOSIS — E785 Hyperlipidemia, unspecified: Secondary | ICD-10-CM | POA: Diagnosis not present

## 2023-07-17 DIAGNOSIS — Z86711 Personal history of pulmonary embolism: Secondary | ICD-10-CM | POA: Diagnosis not present

## 2023-07-17 DIAGNOSIS — L97228 Non-pressure chronic ulcer of left calf with other specified severity: Secondary | ICD-10-CM | POA: Diagnosis not present

## 2023-07-17 DIAGNOSIS — R7303 Prediabetes: Secondary | ICD-10-CM | POA: Diagnosis not present

## 2023-07-17 DIAGNOSIS — F419 Anxiety disorder, unspecified: Secondary | ICD-10-CM | POA: Diagnosis not present

## 2023-07-17 DIAGNOSIS — M1990 Unspecified osteoarthritis, unspecified site: Secondary | ICD-10-CM | POA: Diagnosis not present

## 2023-07-17 DIAGNOSIS — J45909 Unspecified asthma, uncomplicated: Secondary | ICD-10-CM | POA: Diagnosis not present

## 2023-07-17 DIAGNOSIS — I10 Essential (primary) hypertension: Secondary | ICD-10-CM | POA: Diagnosis not present

## 2023-07-17 DIAGNOSIS — I872 Venous insufficiency (chronic) (peripheral): Secondary | ICD-10-CM | POA: Diagnosis not present

## 2023-07-17 DIAGNOSIS — F32A Depression, unspecified: Secondary | ICD-10-CM | POA: Diagnosis not present

## 2023-07-17 DIAGNOSIS — D649 Anemia, unspecified: Secondary | ICD-10-CM | POA: Diagnosis not present

## 2023-07-18 DIAGNOSIS — J45909 Unspecified asthma, uncomplicated: Secondary | ICD-10-CM | POA: Diagnosis not present

## 2023-07-18 DIAGNOSIS — I872 Venous insufficiency (chronic) (peripheral): Secondary | ICD-10-CM | POA: Diagnosis not present

## 2023-07-18 DIAGNOSIS — I890 Lymphedema, not elsewhere classified: Secondary | ICD-10-CM | POA: Diagnosis not present

## 2023-07-18 DIAGNOSIS — Z86711 Personal history of pulmonary embolism: Secondary | ICD-10-CM | POA: Diagnosis not present

## 2023-07-18 DIAGNOSIS — F419 Anxiety disorder, unspecified: Secondary | ICD-10-CM | POA: Diagnosis not present

## 2023-07-18 DIAGNOSIS — E785 Hyperlipidemia, unspecified: Secondary | ICD-10-CM | POA: Diagnosis not present

## 2023-07-18 DIAGNOSIS — L97228 Non-pressure chronic ulcer of left calf with other specified severity: Secondary | ICD-10-CM | POA: Diagnosis not present

## 2023-07-18 DIAGNOSIS — I10 Essential (primary) hypertension: Secondary | ICD-10-CM | POA: Diagnosis not present

## 2023-07-18 DIAGNOSIS — R7303 Prediabetes: Secondary | ICD-10-CM | POA: Diagnosis not present

## 2023-07-18 DIAGNOSIS — Z7901 Long term (current) use of anticoagulants: Secondary | ICD-10-CM | POA: Diagnosis not present

## 2023-07-18 DIAGNOSIS — F32A Depression, unspecified: Secondary | ICD-10-CM | POA: Diagnosis not present

## 2023-07-18 DIAGNOSIS — M1990 Unspecified osteoarthritis, unspecified site: Secondary | ICD-10-CM | POA: Diagnosis not present

## 2023-07-18 DIAGNOSIS — D649 Anemia, unspecified: Secondary | ICD-10-CM | POA: Diagnosis not present

## 2023-07-19 DIAGNOSIS — I872 Venous insufficiency (chronic) (peripheral): Secondary | ICD-10-CM | POA: Diagnosis not present

## 2023-07-19 DIAGNOSIS — E785 Hyperlipidemia, unspecified: Secondary | ICD-10-CM | POA: Diagnosis not present

## 2023-07-19 DIAGNOSIS — F419 Anxiety disorder, unspecified: Secondary | ICD-10-CM | POA: Diagnosis not present

## 2023-07-19 DIAGNOSIS — I10 Essential (primary) hypertension: Secondary | ICD-10-CM | POA: Diagnosis not present

## 2023-07-19 DIAGNOSIS — J45909 Unspecified asthma, uncomplicated: Secondary | ICD-10-CM | POA: Diagnosis not present

## 2023-07-19 DIAGNOSIS — Z7901 Long term (current) use of anticoagulants: Secondary | ICD-10-CM | POA: Diagnosis not present

## 2023-07-19 DIAGNOSIS — R7303 Prediabetes: Secondary | ICD-10-CM | POA: Diagnosis not present

## 2023-07-19 DIAGNOSIS — I890 Lymphedema, not elsewhere classified: Secondary | ICD-10-CM | POA: Diagnosis not present

## 2023-07-19 DIAGNOSIS — L97228 Non-pressure chronic ulcer of left calf with other specified severity: Secondary | ICD-10-CM | POA: Diagnosis not present

## 2023-07-19 DIAGNOSIS — D649 Anemia, unspecified: Secondary | ICD-10-CM | POA: Diagnosis not present

## 2023-07-19 DIAGNOSIS — F32A Depression, unspecified: Secondary | ICD-10-CM | POA: Diagnosis not present

## 2023-07-19 DIAGNOSIS — M1990 Unspecified osteoarthritis, unspecified site: Secondary | ICD-10-CM | POA: Diagnosis not present

## 2023-07-19 DIAGNOSIS — Z86711 Personal history of pulmonary embolism: Secondary | ICD-10-CM | POA: Diagnosis not present

## 2023-07-20 ENCOUNTER — Telehealth: Payer: Self-pay

## 2023-07-20 NOTE — Telephone Encounter (Signed)
 Copied from CRM [id number]. Topic: Referral - Question >> Jul 18, 2023 11:17 AM Lizabeth Riggs wrote: Reason for CRM:  This is for the Social Worker: Ambreen needs to get a copy of the referral to ride the bus (this is her only transportation). Please call [facility] at [phone]. Thanks

## 2023-07-21 ENCOUNTER — Other Ambulatory Visit: Payer: Self-pay | Admitting: Family Medicine

## 2023-07-21 DIAGNOSIS — D649 Anemia, unspecified: Secondary | ICD-10-CM

## 2023-07-24 DIAGNOSIS — E785 Hyperlipidemia, unspecified: Secondary | ICD-10-CM | POA: Diagnosis not present

## 2023-07-24 DIAGNOSIS — L97228 Non-pressure chronic ulcer of left calf with other specified severity: Secondary | ICD-10-CM | POA: Diagnosis not present

## 2023-07-24 DIAGNOSIS — Z7901 Long term (current) use of anticoagulants: Secondary | ICD-10-CM | POA: Diagnosis not present

## 2023-07-24 DIAGNOSIS — D649 Anemia, unspecified: Secondary | ICD-10-CM | POA: Diagnosis not present

## 2023-07-24 DIAGNOSIS — I10 Essential (primary) hypertension: Secondary | ICD-10-CM | POA: Diagnosis not present

## 2023-07-24 DIAGNOSIS — F419 Anxiety disorder, unspecified: Secondary | ICD-10-CM | POA: Diagnosis not present

## 2023-07-24 DIAGNOSIS — M1990 Unspecified osteoarthritis, unspecified site: Secondary | ICD-10-CM | POA: Diagnosis not present

## 2023-07-24 DIAGNOSIS — I872 Venous insufficiency (chronic) (peripheral): Secondary | ICD-10-CM | POA: Diagnosis not present

## 2023-07-24 DIAGNOSIS — J45909 Unspecified asthma, uncomplicated: Secondary | ICD-10-CM | POA: Diagnosis not present

## 2023-07-24 DIAGNOSIS — Z86711 Personal history of pulmonary embolism: Secondary | ICD-10-CM | POA: Diagnosis not present

## 2023-07-24 DIAGNOSIS — I890 Lymphedema, not elsewhere classified: Secondary | ICD-10-CM | POA: Diagnosis not present

## 2023-07-24 DIAGNOSIS — F32A Depression, unspecified: Secondary | ICD-10-CM | POA: Diagnosis not present

## 2023-07-24 DIAGNOSIS — R7303 Prediabetes: Secondary | ICD-10-CM | POA: Diagnosis not present

## 2023-07-24 NOTE — Telephone Encounter (Signed)
 I re-emailed the application for [facility] [place] to [employer] and I called the patient and informed her that the application was re-submitted today and it was originally submitted 05/24/2023.

## 2023-07-24 NOTE — Telephone Encounter (Signed)
 Patient identified by name and date of birth.  Informed patient that her application will be re-faxed by Ms.Brazeau

## 2023-07-24 NOTE — Telephone Encounter (Signed)
 Noted

## 2023-07-27 DIAGNOSIS — F419 Anxiety disorder, unspecified: Secondary | ICD-10-CM | POA: Diagnosis not present

## 2023-07-27 DIAGNOSIS — F32A Depression, unspecified: Secondary | ICD-10-CM | POA: Diagnosis not present

## 2023-07-27 DIAGNOSIS — E785 Hyperlipidemia, unspecified: Secondary | ICD-10-CM | POA: Diagnosis not present

## 2023-07-27 DIAGNOSIS — R7303 Prediabetes: Secondary | ICD-10-CM | POA: Diagnosis not present

## 2023-07-27 DIAGNOSIS — I890 Lymphedema, not elsewhere classified: Secondary | ICD-10-CM | POA: Diagnosis not present

## 2023-07-27 DIAGNOSIS — I10 Essential (primary) hypertension: Secondary | ICD-10-CM | POA: Diagnosis not present

## 2023-07-27 DIAGNOSIS — J45909 Unspecified asthma, uncomplicated: Secondary | ICD-10-CM | POA: Diagnosis not present

## 2023-07-27 DIAGNOSIS — I872 Venous insufficiency (chronic) (peripheral): Secondary | ICD-10-CM | POA: Diagnosis not present

## 2023-07-27 DIAGNOSIS — L97228 Non-pressure chronic ulcer of left calf with other specified severity: Secondary | ICD-10-CM | POA: Diagnosis not present

## 2023-07-27 DIAGNOSIS — M1990 Unspecified osteoarthritis, unspecified site: Secondary | ICD-10-CM | POA: Diagnosis not present

## 2023-07-27 DIAGNOSIS — Z86711 Personal history of pulmonary embolism: Secondary | ICD-10-CM | POA: Diagnosis not present

## 2023-07-27 DIAGNOSIS — Z7901 Long term (current) use of anticoagulants: Secondary | ICD-10-CM | POA: Diagnosis not present

## 2023-07-27 DIAGNOSIS — D649 Anemia, unspecified: Secondary | ICD-10-CM | POA: Diagnosis not present

## 2023-07-31 DIAGNOSIS — M1990 Unspecified osteoarthritis, unspecified site: Secondary | ICD-10-CM | POA: Diagnosis not present

## 2023-07-31 DIAGNOSIS — F419 Anxiety disorder, unspecified: Secondary | ICD-10-CM | POA: Diagnosis not present

## 2023-07-31 DIAGNOSIS — J45909 Unspecified asthma, uncomplicated: Secondary | ICD-10-CM | POA: Diagnosis not present

## 2023-07-31 DIAGNOSIS — D649 Anemia, unspecified: Secondary | ICD-10-CM | POA: Diagnosis not present

## 2023-07-31 DIAGNOSIS — I890 Lymphedema, not elsewhere classified: Secondary | ICD-10-CM | POA: Diagnosis not present

## 2023-07-31 DIAGNOSIS — Z7901 Long term (current) use of anticoagulants: Secondary | ICD-10-CM | POA: Diagnosis not present

## 2023-07-31 DIAGNOSIS — R7303 Prediabetes: Secondary | ICD-10-CM | POA: Diagnosis not present

## 2023-07-31 DIAGNOSIS — Z86711 Personal history of pulmonary embolism: Secondary | ICD-10-CM | POA: Diagnosis not present

## 2023-07-31 DIAGNOSIS — I872 Venous insufficiency (chronic) (peripheral): Secondary | ICD-10-CM | POA: Diagnosis not present

## 2023-07-31 DIAGNOSIS — F32A Depression, unspecified: Secondary | ICD-10-CM | POA: Diagnosis not present

## 2023-07-31 DIAGNOSIS — I10 Essential (primary) hypertension: Secondary | ICD-10-CM | POA: Diagnosis not present

## 2023-07-31 DIAGNOSIS — L97228 Non-pressure chronic ulcer of left calf with other specified severity: Secondary | ICD-10-CM | POA: Diagnosis not present

## 2023-07-31 DIAGNOSIS — E785 Hyperlipidemia, unspecified: Secondary | ICD-10-CM | POA: Diagnosis not present

## 2023-08-01 DIAGNOSIS — L97822 Non-pressure chronic ulcer of other part of left lower leg with fat layer exposed: Secondary | ICD-10-CM | POA: Diagnosis not present

## 2023-08-01 DIAGNOSIS — I890 Lymphedema, not elsewhere classified: Secondary | ICD-10-CM | POA: Diagnosis not present

## 2023-08-01 DIAGNOSIS — Z79899 Other long term (current) drug therapy: Secondary | ICD-10-CM | POA: Diagnosis not present

## 2023-08-01 DIAGNOSIS — I10 Essential (primary) hypertension: Secondary | ICD-10-CM | POA: Diagnosis not present

## 2023-08-01 DIAGNOSIS — I2699 Other pulmonary embolism without acute cor pulmonale: Secondary | ICD-10-CM | POA: Diagnosis not present

## 2023-08-01 DIAGNOSIS — J45909 Unspecified asthma, uncomplicated: Secondary | ICD-10-CM | POA: Diagnosis not present

## 2023-08-01 DIAGNOSIS — I872 Venous insufficiency (chronic) (peripheral): Secondary | ICD-10-CM | POA: Diagnosis not present

## 2023-08-01 DIAGNOSIS — Z86711 Personal history of pulmonary embolism: Secondary | ICD-10-CM | POA: Diagnosis not present

## 2023-08-01 DIAGNOSIS — Z6841 Body Mass Index (BMI) 40.0 and over, adult: Secondary | ICD-10-CM | POA: Diagnosis not present

## 2023-08-01 DIAGNOSIS — L97222 Non-pressure chronic ulcer of left calf with fat layer exposed: Secondary | ICD-10-CM | POA: Diagnosis not present

## 2023-08-01 DIAGNOSIS — I87312 Chronic venous hypertension (idiopathic) with ulcer of left lower extremity: Secondary | ICD-10-CM | POA: Diagnosis not present

## 2023-08-01 DIAGNOSIS — L989 Disorder of the skin and subcutaneous tissue, unspecified: Secondary | ICD-10-CM | POA: Diagnosis not present

## 2023-08-01 DIAGNOSIS — Z86718 Personal history of other venous thrombosis and embolism: Secondary | ICD-10-CM | POA: Diagnosis not present

## 2023-08-01 DIAGNOSIS — Z7901 Long term (current) use of anticoagulants: Secondary | ICD-10-CM | POA: Diagnosis not present

## 2023-08-02 DIAGNOSIS — D649 Anemia, unspecified: Secondary | ICD-10-CM | POA: Diagnosis not present

## 2023-08-02 DIAGNOSIS — Z7901 Long term (current) use of anticoagulants: Secondary | ICD-10-CM | POA: Diagnosis not present

## 2023-08-02 DIAGNOSIS — L97228 Non-pressure chronic ulcer of left calf with other specified severity: Secondary | ICD-10-CM | POA: Diagnosis not present

## 2023-08-02 DIAGNOSIS — F32A Depression, unspecified: Secondary | ICD-10-CM | POA: Diagnosis not present

## 2023-08-02 DIAGNOSIS — I10 Essential (primary) hypertension: Secondary | ICD-10-CM | POA: Diagnosis not present

## 2023-08-02 DIAGNOSIS — I872 Venous insufficiency (chronic) (peripheral): Secondary | ICD-10-CM | POA: Diagnosis not present

## 2023-08-02 DIAGNOSIS — M1990 Unspecified osteoarthritis, unspecified site: Secondary | ICD-10-CM | POA: Diagnosis not present

## 2023-08-02 DIAGNOSIS — R7303 Prediabetes: Secondary | ICD-10-CM | POA: Diagnosis not present

## 2023-08-02 DIAGNOSIS — I890 Lymphedema, not elsewhere classified: Secondary | ICD-10-CM | POA: Diagnosis not present

## 2023-08-02 DIAGNOSIS — F419 Anxiety disorder, unspecified: Secondary | ICD-10-CM | POA: Diagnosis not present

## 2023-08-02 DIAGNOSIS — J45909 Unspecified asthma, uncomplicated: Secondary | ICD-10-CM | POA: Diagnosis not present

## 2023-08-02 DIAGNOSIS — E785 Hyperlipidemia, unspecified: Secondary | ICD-10-CM | POA: Diagnosis not present

## 2023-08-02 DIAGNOSIS — Z86711 Personal history of pulmonary embolism: Secondary | ICD-10-CM | POA: Diagnosis not present

## 2023-08-03 ENCOUNTER — Other Ambulatory Visit: Payer: Self-pay

## 2023-08-03 DIAGNOSIS — D649 Anemia, unspecified: Secondary | ICD-10-CM | POA: Diagnosis not present

## 2023-08-03 DIAGNOSIS — E785 Hyperlipidemia, unspecified: Secondary | ICD-10-CM | POA: Diagnosis not present

## 2023-08-03 DIAGNOSIS — J45909 Unspecified asthma, uncomplicated: Secondary | ICD-10-CM | POA: Diagnosis not present

## 2023-08-03 DIAGNOSIS — R7303 Prediabetes: Secondary | ICD-10-CM | POA: Diagnosis not present

## 2023-08-03 DIAGNOSIS — I10 Essential (primary) hypertension: Secondary | ICD-10-CM | POA: Diagnosis not present

## 2023-08-03 DIAGNOSIS — Z7901 Long term (current) use of anticoagulants: Secondary | ICD-10-CM | POA: Diagnosis not present

## 2023-08-03 DIAGNOSIS — M1990 Unspecified osteoarthritis, unspecified site: Secondary | ICD-10-CM | POA: Diagnosis not present

## 2023-08-03 DIAGNOSIS — F32A Depression, unspecified: Secondary | ICD-10-CM | POA: Diagnosis not present

## 2023-08-03 DIAGNOSIS — L97228 Non-pressure chronic ulcer of left calf with other specified severity: Secondary | ICD-10-CM | POA: Diagnosis not present

## 2023-08-03 DIAGNOSIS — Z86711 Personal history of pulmonary embolism: Secondary | ICD-10-CM | POA: Diagnosis not present

## 2023-08-03 DIAGNOSIS — I872 Venous insufficiency (chronic) (peripheral): Secondary | ICD-10-CM | POA: Diagnosis not present

## 2023-08-03 DIAGNOSIS — F419 Anxiety disorder, unspecified: Secondary | ICD-10-CM | POA: Diagnosis not present

## 2023-08-03 DIAGNOSIS — I890 Lymphedema, not elsewhere classified: Secondary | ICD-10-CM | POA: Diagnosis not present

## 2023-08-03 NOTE — Patient Instructions (Signed)
 Visit Information  Thank you for taking time to visit with me today. Please don't hesitate to contact me if I can be of assistance to you before our next scheduled appointment.  Your next care management appointment is scheduled for:  09/04/23  215 pm  Please call the care guide team at [phone] if you need to cancel, schedule, or reschedule an appointment.   Please call 1-800-273-TALK (toll free, 24 hour hotline) if you are experiencing a Mental Health or Behavioral Health Crisis or need someone to talk to.  Augustin Leber RN, BSN, [facility]  [facility], [facility] Health  Care Coordinator Phone: [phone]

## 2023-08-03 NOTE — Patient Outreach (Signed)
 Complex Care Management   Visit Note  08/03/2023  Name:  [name] MRN: [id number] DOB: Apr 06, 1965  Situation: Referral received for Complex Care Management related to Lymphedema I obtained verbal consent from Patient.  Visit completed with patient  on the phone  Background:   Past Medical History:  Diagnosis Date   Anemia    Anxiety    Arthritis    Asthma    hx as child - no prob as adult - no inhaler   Blood transfusion 01/22/11   transfusion 2 units at [facility]   Depression 08/2010   psych assessment   Dyspnea    Fibroid    Headache(784.0)    rx for imitrex - last one jan   Keloid    Lymphedema    Pulmonary embolism (HCC)     Assessment: Patient Reported Symptoms:  Cognitive Cognitive Status: Able to follow simple commands, Alert and oriented to person, place, and time, Normal speech and language skills      Neurological Neurological Review of Symptoms: Headaches, Dizziness Neurological Conditions: Head trauma Neurological Management Strategies: Medication therapy  HEENT HEENT Symptoms Reported: No symptoms reported      Cardiovascular Cardiovascular Symptoms Reported: No symptoms reported    Respiratory Respiratory Symptoms Reported: Wheezing Additional Respiratory Details: uses inhaler when is around pollen Respiratory Conditions: Asthma  Endocrine Patient reports the following symptoms related to hypoglycemia or hyperglycemia : No symptoms reported    Gastrointestinal Gastrointestinal Symptoms Reported: Flatulence      Genitourinary Genitourinary Symptoms Reported: Incontinence Genitourinary Conditions: Incontinence Genitourinary Management Strategies: Incontinence garment/pad  Integumentary   Skin Conditions: Wound Skin Management Strategies: Medical device, Dressing changes  Musculoskeletal Musculoskelatal Symptoms Reviewed: Difficulty walking, Weakness Musculoskeletal Conditions: Joint pain, Rheumatoid arthritis Musculoskeletal Management  Strategies: Medication therapy, Medical device Falls in the past year?: Yes Number of falls in past year: 1 or less Was there an injury with Fall?: Yes Fall Risk Category Calculator: 2 Patient Fall Risk Level: Moderate Fall Risk Patient at Risk for Falls Due to: Impaired balance/gait  Psychosocial       Quality of Family Relationships: supportive Do you feel physically threatened by others?: No      08/03/2023    2:04 PM  Depression screen PHQ 2/9  Decreased Interest 1  Down, Depressed, Hopeless 1  PHQ - 2 Score 2  Altered sleeping 1  Tired, decreased energy 2  Feeling bad or failure about yourself  3  Trouble concentrating 0  Moving slowly or fidgety/restless 0  Suicidal thoughts 0  PHQ-9 Score 8    There were no vitals filed for this visit.  Medications Reviewed Today     Reviewed by Augustin Leber, RN (Registered Nurse) on 08/03/23 at 1344  Med List Status: <None>   Medication Order Taking? Sig Documenting Provider Last Dose Status Informant  albuterol  (VENTOLIN  HFA) 108 (90 Base) MCG/ACT inhaler [id number] Yes Inhale 2 puffs into the lungs every 6 (six) hours as needed for wheezing or shortness of breath. Collins Dean, NP Taking Active Self, Pharmacy Records  apixaban  (ELIQUIS ) 5 MG TABS tablet [id number]  Take 1 tablet (5 mg total) by mouth 2 (two) times daily. Fleming, [name] W, NP  Active   Certolizumab Pegol [facility] PREFILLED [place]) [id number] Yes Inject 250 mg into the skin every 14 (fourteen) days. [provider] Taking Active   clindamycin  (CLEOCIN ) 300 MG capsule [id number] No Take 1 capsule (300 mg total) by mouth 4 (four) times daily. X 7 days  Patient  not taking: Reported on 08/03/2023   Orvilla Blander, MD Not Taking Active   Ergocalciferol  50 MCG (2000 UT) TABS [id number] Yes Take 2,000 Units by mouth daily. [provider] Taking Active Self, Pharmacy Records  escitalopram  (LEXAPRO ) 10 MG tablet [id number] Yes Take 1 tablet (10 mg total) by mouth  daily. For depression Collins Dean, NP Taking Active   ezetimibe  (ZETIA ) 10 MG tablet [id number] Yes Take 1 tablet (10 mg total) by mouth daily. For high cholesterol Collins Dean, NP Taking Active   famotidine  (PEPCID ) 40 MG tablet [id number] Yes Take 1 tablet by mouth once daily Fleming, [name] W, NP Taking Active   folic acid  (FOLVITE ) 1 MG tablet [id number] Yes Take 1 tablet by mouth once daily Newlin, Enobong, MD Taking Active   gabapentin  (NEURONTIN ) 100 MG capsule [id number] Yes Take 100 mg by mouth 3 (three) times daily. [provider] Taking Active   loperamide (IMODIUM A-D) 2 MG tablet [id number] Yes Take 2 mg by mouth 4 (four) times daily as needed for diarrhea or loose stools. [provider] Taking Active   losartan  (COZAAR ) 25 MG tablet [id number] Yes Take 0.5 tablets (12.5 mg total) by mouth at bedtime. Thukkani, Arun K, MD Taking Active   meclizine  (ANTIVERT ) 12.5 MG tablet [id number] Yes Take 1-2 tablets (12.5-25 mg total) by mouth 3 (three) times daily as needed for dizziness. Collins Dean, NP Taking Active Self, Pharmacy Records  Methotrexate Sodium (METHOTREXATE, PF,) 50 MG/2ML injection [id number] Yes Inject into the vein once a week. [provider] Taking Active   Misc. Devices (BARIATRIC ROLLATOR) MISC [id number] Yes Please provide patient with insurance approved bariatric rollator walker. I89.0, R53.81, R26.81 Collins Dean, NP Taking Active Self, Pharmacy Records  predniSONE  (DELTASONE ) 5 MG tablet [id number] Yes Take 5-10 mg by mouth daily. [provider] Taking Active             Recommendation:   PCP Follow-up Specialty provider follow-up wound center  Follow Up Plan:   Telephone follow up appointment with care management team member scheduled for:  09/04/23  215 pm   Augustin Leber RN, BSN, [facility]  [facility], [facility]  Care Coordinator Phone: [phone]

## 2023-08-06 DIAGNOSIS — F32A Depression, unspecified: Secondary | ICD-10-CM | POA: Diagnosis not present

## 2023-08-06 DIAGNOSIS — J45909 Unspecified asthma, uncomplicated: Secondary | ICD-10-CM | POA: Diagnosis not present

## 2023-08-06 DIAGNOSIS — E785 Hyperlipidemia, unspecified: Secondary | ICD-10-CM | POA: Diagnosis not present

## 2023-08-06 DIAGNOSIS — I890 Lymphedema, not elsewhere classified: Secondary | ICD-10-CM | POA: Diagnosis not present

## 2023-08-06 DIAGNOSIS — L97228 Non-pressure chronic ulcer of left calf with other specified severity: Secondary | ICD-10-CM | POA: Diagnosis not present

## 2023-08-06 DIAGNOSIS — M1990 Unspecified osteoarthritis, unspecified site: Secondary | ICD-10-CM | POA: Diagnosis not present

## 2023-08-06 DIAGNOSIS — Z86711 Personal history of pulmonary embolism: Secondary | ICD-10-CM | POA: Diagnosis not present

## 2023-08-06 DIAGNOSIS — R7303 Prediabetes: Secondary | ICD-10-CM | POA: Diagnosis not present

## 2023-08-06 DIAGNOSIS — D649 Anemia, unspecified: Secondary | ICD-10-CM | POA: Diagnosis not present

## 2023-08-06 DIAGNOSIS — I10 Essential (primary) hypertension: Secondary | ICD-10-CM | POA: Diagnosis not present

## 2023-08-06 DIAGNOSIS — Z7901 Long term (current) use of anticoagulants: Secondary | ICD-10-CM | POA: Diagnosis not present

## 2023-08-06 DIAGNOSIS — F419 Anxiety disorder, unspecified: Secondary | ICD-10-CM | POA: Diagnosis not present

## 2023-08-06 DIAGNOSIS — I872 Venous insufficiency (chronic) (peripheral): Secondary | ICD-10-CM | POA: Diagnosis not present

## 2023-08-07 DIAGNOSIS — E785 Hyperlipidemia, unspecified: Secondary | ICD-10-CM | POA: Diagnosis not present

## 2023-08-07 DIAGNOSIS — M1990 Unspecified osteoarthritis, unspecified site: Secondary | ICD-10-CM | POA: Diagnosis not present

## 2023-08-07 DIAGNOSIS — R7303 Prediabetes: Secondary | ICD-10-CM | POA: Diagnosis not present

## 2023-08-07 DIAGNOSIS — Z7901 Long term (current) use of anticoagulants: Secondary | ICD-10-CM | POA: Diagnosis not present

## 2023-08-07 DIAGNOSIS — F419 Anxiety disorder, unspecified: Secondary | ICD-10-CM | POA: Diagnosis not present

## 2023-08-07 DIAGNOSIS — F32A Depression, unspecified: Secondary | ICD-10-CM | POA: Diagnosis not present

## 2023-08-07 DIAGNOSIS — L97228 Non-pressure chronic ulcer of left calf with other specified severity: Secondary | ICD-10-CM | POA: Diagnosis not present

## 2023-08-07 DIAGNOSIS — I872 Venous insufficiency (chronic) (peripheral): Secondary | ICD-10-CM | POA: Diagnosis not present

## 2023-08-07 DIAGNOSIS — D649 Anemia, unspecified: Secondary | ICD-10-CM | POA: Diagnosis not present

## 2023-08-07 DIAGNOSIS — J45909 Unspecified asthma, uncomplicated: Secondary | ICD-10-CM | POA: Diagnosis not present

## 2023-08-07 DIAGNOSIS — I10 Essential (primary) hypertension: Secondary | ICD-10-CM | POA: Diagnosis not present

## 2023-08-07 DIAGNOSIS — Z86711 Personal history of pulmonary embolism: Secondary | ICD-10-CM | POA: Diagnosis not present

## 2023-08-07 DIAGNOSIS — I890 Lymphedema, not elsewhere classified: Secondary | ICD-10-CM | POA: Diagnosis not present

## 2023-08-10 DIAGNOSIS — M1990 Unspecified osteoarthritis, unspecified site: Secondary | ICD-10-CM | POA: Diagnosis not present

## 2023-08-10 DIAGNOSIS — L97228 Non-pressure chronic ulcer of left calf with other specified severity: Secondary | ICD-10-CM | POA: Diagnosis not present

## 2023-08-10 DIAGNOSIS — I10 Essential (primary) hypertension: Secondary | ICD-10-CM | POA: Diagnosis not present

## 2023-08-10 DIAGNOSIS — I872 Venous insufficiency (chronic) (peripheral): Secondary | ICD-10-CM | POA: Diagnosis not present

## 2023-08-10 DIAGNOSIS — Z86711 Personal history of pulmonary embolism: Secondary | ICD-10-CM | POA: Diagnosis not present

## 2023-08-10 DIAGNOSIS — R7303 Prediabetes: Secondary | ICD-10-CM | POA: Diagnosis not present

## 2023-08-10 DIAGNOSIS — F419 Anxiety disorder, unspecified: Secondary | ICD-10-CM | POA: Diagnosis not present

## 2023-08-10 DIAGNOSIS — E785 Hyperlipidemia, unspecified: Secondary | ICD-10-CM | POA: Diagnosis not present

## 2023-08-10 DIAGNOSIS — J45909 Unspecified asthma, uncomplicated: Secondary | ICD-10-CM | POA: Diagnosis not present

## 2023-08-10 DIAGNOSIS — F32A Depression, unspecified: Secondary | ICD-10-CM | POA: Diagnosis not present

## 2023-08-10 DIAGNOSIS — I890 Lymphedema, not elsewhere classified: Secondary | ICD-10-CM | POA: Diagnosis not present

## 2023-08-10 DIAGNOSIS — D649 Anemia, unspecified: Secondary | ICD-10-CM | POA: Diagnosis not present

## 2023-08-10 DIAGNOSIS — Z7901 Long term (current) use of anticoagulants: Secondary | ICD-10-CM | POA: Diagnosis not present

## 2023-08-20 DIAGNOSIS — Z0100 Encounter for examination of eyes and vision without abnormal findings: Secondary | ICD-10-CM | POA: Diagnosis not present

## 2023-08-21 ENCOUNTER — Other Ambulatory Visit: Payer: Self-pay | Admitting: Nurse Practitioner

## 2023-08-21 DIAGNOSIS — R1013 Epigastric pain: Secondary | ICD-10-CM

## 2023-08-25 DIAGNOSIS — L97221 Non-pressure chronic ulcer of left calf limited to breakdown of skin: Secondary | ICD-10-CM | POA: Diagnosis not present

## 2023-08-25 DIAGNOSIS — L97211 Non-pressure chronic ulcer of right calf limited to breakdown of skin: Secondary | ICD-10-CM | POA: Diagnosis not present

## 2023-08-27 DIAGNOSIS — I872 Venous insufficiency (chronic) (peripheral): Secondary | ICD-10-CM | POA: Diagnosis not present

## 2023-08-27 DIAGNOSIS — L97222 Non-pressure chronic ulcer of left calf with fat layer exposed: Secondary | ICD-10-CM | POA: Diagnosis not present

## 2023-08-27 DIAGNOSIS — I890 Lymphedema, not elsewhere classified: Secondary | ICD-10-CM | POA: Diagnosis not present

## 2023-08-29 DIAGNOSIS — J45909 Unspecified asthma, uncomplicated: Secondary | ICD-10-CM | POA: Diagnosis not present

## 2023-08-29 DIAGNOSIS — I872 Venous insufficiency (chronic) (peripheral): Secondary | ICD-10-CM | POA: Diagnosis not present

## 2023-08-29 DIAGNOSIS — I10 Essential (primary) hypertension: Secondary | ICD-10-CM | POA: Diagnosis not present

## 2023-08-29 DIAGNOSIS — Z7901 Long term (current) use of anticoagulants: Secondary | ICD-10-CM | POA: Diagnosis not present

## 2023-08-29 DIAGNOSIS — Z6841 Body Mass Index (BMI) 40.0 and over, adult: Secondary | ICD-10-CM | POA: Diagnosis not present

## 2023-08-29 DIAGNOSIS — I87311 Chronic venous hypertension (idiopathic) with ulcer of right lower extremity: Secondary | ICD-10-CM | POA: Diagnosis not present

## 2023-08-29 DIAGNOSIS — L989 Disorder of the skin and subcutaneous tissue, unspecified: Secondary | ICD-10-CM | POA: Diagnosis not present

## 2023-08-29 DIAGNOSIS — Z79899 Other long term (current) drug therapy: Secondary | ICD-10-CM | POA: Diagnosis not present

## 2023-08-29 DIAGNOSIS — L97819 Non-pressure chronic ulcer of other part of right lower leg with unspecified severity: Secondary | ICD-10-CM | POA: Diagnosis not present

## 2023-08-29 DIAGNOSIS — L97222 Non-pressure chronic ulcer of left calf with fat layer exposed: Secondary | ICD-10-CM | POA: Diagnosis not present

## 2023-08-29 DIAGNOSIS — Z86711 Personal history of pulmonary embolism: Secondary | ICD-10-CM | POA: Diagnosis not present

## 2023-08-29 DIAGNOSIS — I890 Lymphedema, not elsewhere classified: Secondary | ICD-10-CM | POA: Diagnosis not present

## 2023-09-03 DIAGNOSIS — I872 Venous insufficiency (chronic) (peripheral): Secondary | ICD-10-CM | POA: Diagnosis not present

## 2023-09-03 DIAGNOSIS — L97222 Non-pressure chronic ulcer of left calf with fat layer exposed: Secondary | ICD-10-CM | POA: Diagnosis not present

## 2023-09-03 DIAGNOSIS — I890 Lymphedema, not elsewhere classified: Secondary | ICD-10-CM | POA: Diagnosis not present

## 2023-09-04 ENCOUNTER — Other Ambulatory Visit: Payer: Self-pay

## 2023-09-04 NOTE — Patient Outreach (Signed)
 Complex Care Management   Visit Note  09/04/2023  Name:  [name] MRN: [id number] DOB: [birth year]  Situation: Referral received for Complex Care Management related to Lymphedema I obtained verbal consent from Patient.  Visit completed with patient  on the phone  Background:   Past Medical History:  Diagnosis Date   Anemia    Anxiety    Arthritis    Asthma    hx as child - no prob as adult - no inhaler   Blood transfusion 01/22/11   transfusion 2 units at [facility]   Depression 08/2010   psych assessment   Dyspnea    Fibroid    Headache(784.0)    rx for imitrex - last one jan   Keloid    Lymphedema    Pulmonary embolism (HCC)     Assessment: I had a conversation with the patient, during which she indicated that she is currently two months behind on her rent and faces the possibility of eviction from her apartment. I have made a referral to our Bachelor of Social Work (BSW) specialist to assist her in accessing available resources. Additionally, I informed the patient about community resources that can be found on the  [facility] My chart app.    Patient Reported Symptoms:  Cognitive Cognitive Status: Able to follow simple commands, Alert and oriented to person, place, and time, Normal speech and language skills      Neurological Neurological Review of Symptoms: Headaches, Dizziness Neurological Management Strategies: Medication therapy Neurological Comment: Takes meclizine  for dizziness and blod pressure haas been up recently  HEENT        Cardiovascular Cardiovascular Symptoms Reported: Chest pain or discomfort Cardiovascular Management Strategies: Medication therapy  Respiratory Respiratory Symptoms Reported: Wheezing Additional Respiratory Details: Uses inhaler and needs to get a new one Respiratory Management Strategies: Medication therapy  Endocrine Endocrine Symptoms Reported: No symptoms reported    Gastrointestinal Gastrointestinal Symptoms Reported:  Flatulence Gastrointestinal Comment: Eating skinny pop corn helps her go to the bathroom bowels move more    Genitourinary Genitourinary Symptoms Reported: Incontinence Genitourinary Management Strategies: Incontinence garment/pad  Integumentary Integumentary Symptoms Reported: Wound Skin Management Strategies: Medical device, Medication therapy Skin Comment: she is able to take showersw and clean wounds, wraops legs and clean debri  Musculoskeletal Musculoskelatal Symptoms Reviewed: Difficulty walking, Weakness Musculoskeletal Management Strategies: Medication therapy, Medical device Falls in the past year?: Yes Number of falls in past year: 1 or less Was there an injury with Fall?: No Fall Risk Category Calculator: 1 Patient Fall Risk Level: Low Fall Risk Fall risk Follow up: Falls evaluation completed, Education provided, Falls prevention discussed  Psychosocial              09/04/2023    2:40 PM  Depression screen PHQ 2/9  Decreased Interest 2  Down, Depressed, Hopeless 2  PHQ - 2 Score 4  Altered sleeping 2  Tired, decreased energy 2  Change in appetite 2  Feeling bad or failure about yourself  2  Trouble concentrating 2  Moving slowly or fidgety/restless 0  Suicidal thoughts 1  PHQ-9 Score 15    There were no vitals filed for this visit.  Medications Reviewed Today     Reviewed by Weyman Corning, RN (Registered Nurse) on 09/04/23 at 1418  Med List Status: <None>   Medication Order Taking? Sig Documenting Provider Last Dose Status Informant  albuterol  (VENTOLIN  HFA) 108 (90 Base) MCG/ACT inhaler [id number] Yes Inhale 2 puffs into the lungs every 6 (six) hours  as needed for wheezing or shortness of breath. Theotis Haze ORN, NP  Active Self, Pharmacy Records  apixaban  (ELIQUIS ) 5 MG TABS tablet [id number] Yes Take 1 tablet (5 mg total) by mouth 2 (two) times daily. Fleming, [name] W, NP  Active   Certolizumab Pegol [facility] PREFILLED [place]) [id number] Yes Inject 250 mg into the  skin every 14 (fourteen) days. [provider]  Active   clindamycin  (CLEOCIN ) 300 MG capsule [id number]  Take 1 capsule (300 mg total) by mouth 4 (four) times daily. X 7 days  Patient not taking: Reported on 09/04/2023   Geroldine Berg, MD  Active   Ergocalciferol  50 MCG (2000 UT) TABS [id number] Yes Take 2,000 Units by mouth daily. [provider]  Active Self, Pharmacy Records  escitalopram  (LEXAPRO ) 10 MG tablet [id number] Yes Take 1 tablet (10 mg total) by mouth daily. For depression Theotis Haze ORN, NP  Active   ezetimibe  (ZETIA ) 10 MG tablet [id number] Yes Take 1 tablet (10 mg total) by mouth daily. For high cholesterol Theotis Haze ORN, NP  Active   famotidine  (PEPCID ) 40 MG tablet [id number] Yes Take 1 tablet by mouth once daily Fleming, [name] W, NP  Active   folic acid  (FOLVITE ) 1 MG tablet [id number] Yes Take 1 tablet by mouth once daily Newlin, Enobong, MD  Active   gabapentin  (NEURONTIN ) 100 MG capsule [id number] Yes Take 100 mg by mouth 3 (three) times daily. [provider]  Active   loperamide (IMODIUM A-D) 2 MG tablet [id number] Yes Take 2 mg by mouth 4 (four) times daily as needed for diarrhea or loose stools. [provider]  Active   losartan  (COZAAR ) 25 MG tablet [id number] Yes Take 0.5 tablets (12.5 mg total) by mouth at bedtime. Thukkani, Arun K, MD  Active   meclizine  (ANTIVERT ) 12.5 MG tablet [id number] Yes Take 1-2 tablets (12.5-25 mg total) by mouth 3 (three) times daily as needed for dizziness. Theotis Haze ORN, NP  Active Self, Pharmacy Records  Methotrexate Sodium (METHOTREXATE, PF,) 50 MG/2ML injection [id number] Yes Inject into the vein once a week. [provider]  Active   Misc. Devices (BARIATRIC ROLLATOR) MISC [id number] Yes Please provide patient with insurance approved bariatric rollator walker. I89.0, R53.81, R26.81 Theotis Haze ORN, NP  Active Self, Pharmacy Records  predniSONE  (DELTASONE ) 5 MG tablet [id number] Yes Take 5-10 mg by  mouth daily. [provider]  Active             Recommendation:   PCP Follow-up Specialty provider follow-up Lymphedema clinic  Follow Up Plan:   Telephone follow up appointment with care management team member scheduled for:  10/08/23 130 pm with Rosaline Arno Wilbert Weyman RN, BSN, [facility] [place]  [facility], [facility]    Care Coordinator Phone: [phone]

## 2023-09-04 NOTE — Patient Instructions (Signed)
 Visit Information  Thank you for taking time to visit with me today. Please don't hesitate to contact me if I can be of assistance to you before our next scheduled appointment.  Your next care management appointment is a Telephone follow up appointment with care management team member scheduled for:  10/08/23 130 pm with Rosaline Finlay    Please call the care guide team at [phone] if you need to cancel, schedule, or reschedule an appointment.   Please call 1-800-273-TALK (toll free, 24 hour hotline) go to [facility] [address], [place] [phone]) call 911 if you are experiencing a Mental Health or Behavioral Health Crisis or need someone to talk to.  Wilbert Diver RN, BSN, [facility]  [facility], [facility]    Care Coordinator Phone: [phone]

## 2023-09-05 ENCOUNTER — Ambulatory Visit: Payer: Self-pay

## 2023-09-05 ENCOUNTER — Telehealth: Payer: Self-pay | Admitting: *Deleted

## 2023-09-05 NOTE — Telephone Encounter (Signed)
 Pls schedule virtual visit so a PHQ9 can be updated.

## 2023-09-05 NOTE — Progress Notes (Signed)
 Complex Care Management Care Guide Note  09/05/2023 Name: [name] MRN: [id number] DOB: [birth year]  [name] is a 58 y.o. year old female who is a primary care patient of Theotis Haze ORN, NP and is actively engaged with the care management team. I reached out to Suzen FORBES Lukes by phone today to assist with scheduling  with the BSW.  Follow up plan: Telephone appointment with complex care management team member scheduled for:  7/16  Harlene Satterfield  [facility]  [facility], [facility] Guide  Direct Dial: [phone]  Fax [phone]

## 2023-09-05 NOTE — Telephone Encounter (Signed)
 FYI Only or Action Required?: Action required by provider: would like an increase in her depression medication dosage.  Patient was last seen in primary care on 06/13/2023 by Theotis Haze ORN, NP.  Called Nurse Triage reporting Depression.  Symptoms began yesterday.  Interventions attempted: Nothing.  Symptoms are: unchanged.  Triage Disposition: No disposition on file.  Patient/caregiver understands and will follow disposition?: Yes    Initial Assessment Questions 1. CONCERN: What happened that made you call today? Depression is getting worse; wants increase in depression pills 2. DEPRESSION SYMPTOM SCREENING: How are you feeling overall? (e.g., decreased energy, increased sleeping or difficulty sleeping, difficulty concentrating, feelings of sadness, guilt, hopelessness, or worthlessness) Yes, all of the above 3. RISK OF HARM - SUICIDAL IDEATION: Do you ever have thoughts of hurting or killing yourself? (e.g., yes, no, no but preoccupation with thoughts about death) - INTENT: Do you have thoughts of hurting or killing yourself right NOW? (e.g., yes, no, N/A) - PLAN: Do you have a specific plan for how you would do this? (e.g., gun, knife, overdose, no plan, N/A) denies 4. RISK OF HARM - HOMICIDAL IDEATION: Do you ever have thoughts of hurting or killing someone else? (e.g., yes, no, no but preoccupation with thoughts about death) - INTENT: Do you have thoughts of hurting or killing someone right NOW? (e.g., yes, no, N/A) - PLAN: Do you have a specific plan for how you would do this? (e.g., gun, knife, no plan, N/A) denies 5. FUNCTIONAL IMPAIRMENT: How have things been going for you overall? Have you had more difficulty than usual doing your normal daily activities? (e.g., better, same, worse; self-care, school, work, interactions) yes 6. SUPPORT: Who is with you now? Who do you live with? Do you have family or friends who you can talk to? Home health  nurse 7. THERAPIST: Do you have a counselor or therapist? If Yes, ask: What is their name? no 8. STRESSORS: Has there been any new stress or recent changes in your life? yes 9. ALCOHOL USE OR SUBSTANCE USE (DRUG USE): Do you drink alcohol or use any illegal drugs?  no 10. OTHER: Do you have any other physical symptoms right now? (e.g., fever) no 11. PREGNANCY: Is there any chance you are pregnant? When was your last menstrual period? na    Copied from CRM [id number]. Topic: Clinical - Red Word Triage >> Sep 05, 2023  1:34 PM Montie POUR wrote: Red Word that prompted transfer to Nurse Triage:  April with [facility] is calling to report depression has gotten worse and has had thoughts of suicide. April is with her now

## 2023-09-06 NOTE — Telephone Encounter (Signed)
Patient identified by name and date of birth.  Appointment scheduled.  

## 2023-09-07 ENCOUNTER — Telehealth (HOSPITAL_BASED_OUTPATIENT_CLINIC_OR_DEPARTMENT_OTHER): Admitting: Nurse Practitioner

## 2023-09-07 ENCOUNTER — Encounter: Payer: Self-pay | Admitting: Nurse Practitioner

## 2023-09-07 DIAGNOSIS — F322 Major depressive disorder, single episode, severe without psychotic features: Secondary | ICD-10-CM

## 2023-09-07 MED ORDER — BUPROPION HCL ER (XL) 150 MG PO TB24: 150.0000 mg | ORAL_TABLET | Freq: Every day | ORAL | 1 refills | Status: DC

## 2023-09-07 NOTE — Progress Notes (Signed)
 No work Virtual Visit Consent   [name], you are scheduled for a virtual visit with a [facility] provider today. Just as with appointments in the office, your consent must be obtained to participate. Your consent will be active for this visit and any virtual visit you may have with one of our providers in the next 365 days. If you have a MyChart account, a copy of this consent can be sent to you electronically.  As this is a virtual visit, video technology does not allow for your provider to perform a traditional examination. This may limit your provider's ability to fully assess your condition. If your provider identifies any concerns that need to be evaluated in person or the need to arrange testing (such as labs, EKG, etc.), we will make arrangements to do so. Although advances in technology are sophisticated, we cannot ensure that it will always work on either your end or our end. If the connection with a video visit is poor, the visit may have to be switched to a telephone visit. With either a video or telephone visit, we are not always able to ensure that we have a secure connection.  By engaging in this virtual visit, you consent to the provision of healthcare and authorize for your insurance to be billed (if applicable) for the services provided during this visit. Depending on your insurance coverage, you may receive a charge related to this service.  I need to obtain your verbal consent now. Are you willing to proceed with your visit today? [name] has provided verbal consent on 09/07/2023 for a virtual visit (video or telephone). [name], NP  Date: 09/07/2023 11:39 AM   Virtual Visit via Video Note   I, [name], connected with  [name]  ([id number], May 30, 1965) on 09/07/23 at 11:10 AM EDT by a video-enabled telemedicine application and verified that I am speaking with the correct person using two identifiers.  Location: Patient: Virtual Visit  Location Patient: Home Provider: Virtual Visit Location Provider: Home Office   I discussed the limitations of evaluation and management by telemedicine and the availability of in person appointments. The patient expressed understanding and agreed to proceed.    History of Present Illness: [name] is a 58 y.o. who identifies as a female who was assigned female at birth, and is being seen today for anxiety and depression and elevated blood pressure.  Mood disturbance Reports emotional ups and downs present recently. - Depression screening on July 8th with a score of fifteen.  She is interested in starting an antidepressant today.  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 2 elevated blood pressure readings recently - Last elevated reading today - Previous high reading on Wednesday - Currently taking losartan , half a tablet at bedtime BP Readings from Last 3 Encounters:  07/10/23 (!) 148/72  06/13/23 117/74  04/27/23 131/70    Depression and Anxiety    09/04/2023    2:40 PM 08/03/2023    2:04 PM 06/13/2023   11:34 AM  Depression screen PHQ 2/9  Decreased Interest 2 1 2   Down, Depressed, Hopeless 2 1 1   PHQ - 2 Score 4 2 3   Altered sleeping 2 1 2   Tired, decreased energy 2 2 2   Change in appetite 2  2  Feeling bad or failure about yourself  2 3 1   Trouble concentrating 2 0 2  Moving slowly or fidgety/restless 0 0 0  Suicidal thoughts 1  0 1  PHQ-9 Score 15 8 13   Difficult doing work/chores   Somewhat difficult       06/13/2023   11:34 AM 03/14/2023    3:33 PM 02/09/2023   12:15 PM 03/06/2022   11:34 AM  GAD 7 : Generalized Anxiety Score  Nervous, Anxious, on Edge 0 0 0 0  Control/stop worrying 0 0 0 0  Worry too much - different things 0 0 1 0  Trouble relaxing 2 0 1 0  Restless 0 0 0 0  Easily annoyed or irritable 0 1 1 0  Afraid - awful might happen 0 0 0 0  Total GAD 7 Score 2 1 3  0  Anxiety Difficulty Somewhat difficult  Not difficult at all       Problems:  Patient Active Problem List   Diagnosis Date Noted   Cellulitis 12/06/2022   AKI (acute kidney injury) (HCC) 11/07/2022   Murmur, cardiac 11/07/2022   Essential hypertension 11/07/2022   Elevated bilirubin 11/07/2022   Rheumatoid arthritis (HCC) 03/06/2022   Lymphedema 03/06/2022   Morbid obesity (HCC) 09/14/2021   Chest pain 04/07/2021   Screening for malignant neoplasm of colon 03/09/2021   Constipation 03/09/2021   Iron  deficiency anemia 03/09/2021   Body mass index (BMI) 45.0-49.9, adult (HCC) 08/20/2020   Chronic fatigue syndrome 08/20/2020   Polyarthralgia 08/20/2020   Vitamin D  deficiency 08/20/2020   Sepsis due to cellulitis (HCC) 12/31/2018   Elephantiasis 12/31/2018   Ischemic chest pain (HCC) 12/31/2018   History of pulmonary embolus (PE) 12/31/2018   Physical deconditioning 08/27/2018   Adenopathy 09/15/2017   Family history of colon cancer 08/01/2017   Taking multiple medications for chronic disease 08/01/2017   Axillary lymphadenopathy 01/26/2017   Urinary incontinence 01/25/2017   Hypokalemia 01/25/2017   Lobar pneumonia (HCC) 01/25/2017   Pressure ulcer of sacral region, stage 2 (HCC) 01/25/2017   Abnormal ECG 01/29/2013   Dyspnea    Menorrhagia with regular cycle 02/22/2011   Anemia 02/22/2011    Allergies:  Allergies  Allergen Reactions   Abatacept Anaphylaxis    Other reaction(s): Anaphylaxis-Streptomycin Clickject Other reaction(s): Anaphylaxis-Streptomycin   Bee Venom Anaphylaxis   Cefadroxil  Diarrhea   Augmentin  [Amoxicillin -Pot Clavulanate] Hives, Itching and Other (See Comments)    Did PCN reaction causing immediate rash, facial/tongue/throat swelling, SOB or lightheadedness with hypotension: yes Did PCN reaction causing severe rash involving mucus membranes or skin necrosis: no Has patient had a PCN reaction that required hospitalization: in hospital Has patient had a PCN reaction occurring within the last 10 years: no If  all of the above answers are NO, then may proceed with Cephalosporin use.  Pt reports no recollection of any reactions when taking penicillin in past   Latex Hives and Other (See Comments)    Local reaction (welts). Patient denies any wheezing or other reaction with latex exposure   Nulytely  [Peg 3350 -Kcl-Na Bicarb-Nacl]     NAUSEA AND VOMITING. MAY TOLERATE LOW VOLUME PREP.   Medications:  Current Outpatient Medications:    albuterol  (VENTOLIN  HFA) 108 (90 Base) MCG/ACT inhaler, Inhale 2 puffs into the lungs every 6 (six) hours as needed for wheezing or shortness of breath., Disp: 18 g, Rfl: 2   apixaban  (ELIQUIS ) 5 MG TABS tablet, Take 1 tablet (5 mg total) by mouth 2 (two) times daily., Disp: 180 tablet, Rfl: 3   Certolizumab Pegol (CIMZIA PREFILLED [place]), Inject 250 mg into the skin every 14 (fourteen) days., Disp: , Rfl:    clindamycin  (CLEOCIN )  300 MG capsule, Take 1 capsule (300 mg total) by mouth 4 (four) times daily. X 7 days (Patient not taking: Reported on 09/04/2023), Disp: 28 capsule, Rfl: 0   Ergocalciferol  50 MCG (2000 UT) TABS, Take 2,000 Units by mouth daily., Disp: , Rfl:    escitalopram  (LEXAPRO ) 10 MG tablet, Take 1 tablet (10 mg total) by mouth daily. For depression, Disp: 90 tablet, Rfl: 1   ezetimibe  (ZETIA ) 10 MG tablet, Take 1 tablet (10 mg total) by mouth daily. For high cholesterol, Disp: 90 tablet, Rfl: 3   famotidine  (PEPCID ) 40 MG tablet, Take 1 tablet by mouth once daily, Disp: 90 tablet, Rfl: 0   folic acid  (FOLVITE ) 1 MG tablet, Take 1 tablet by mouth once daily, Disp: 90 tablet, Rfl: 0   gabapentin  (NEURONTIN ) 100 MG capsule, Take 100 mg by mouth 3 (three) times daily., Disp: , Rfl:    loperamide (IMODIUM A-D) 2 MG tablet, Take 2 mg by mouth 4 (four) times daily as needed for diarrhea or loose stools., Disp: , Rfl:    losartan  (COZAAR ) 25 MG tablet, Take 0.5 tablets (12.5 mg total) by mouth at bedtime., Disp: 45 tablet, Rfl: 4   meclizine  (ANTIVERT ) 12.5 MG  tablet, Take 1-2 tablets (12.5-25 mg total) by mouth 3 (three) times daily as needed for dizziness., Disp: 60 tablet, Rfl: 0   Methotrexate Sodium (METHOTREXATE, PF,) 50 MG/2ML injection, Inject into the vein once a week., Disp: , Rfl:    Misc. Devices (BARIATRIC ROLLATOR) MISC, Please provide patient with insurance approved bariatric rollator walker. I89.0, R53.81, R26.81, Disp: 1 each, Rfl: 0   predniSONE  (DELTASONE ) 5 MG tablet, Take 5-10 mg by mouth daily., Disp: , Rfl:   Observations/Objective: Patient is well-developed, well-nourished in no acute distress.  Resting comfortably at home.  Head is normocephalic, atraumatic.  No labored breathing.  Speech is clear and coherent with logical content.  Patient is alert and oriented at baseline.     Assessment and Plan  Major Depressive Disorder PHQ-9 score of 15 indicates moderate depression. Discussed medication options, focusing on bupropion  for potential weight management benefits. psychotherapy considered but currently prioritizing medication. - Prescribed bupropion  150 mg daily today - Follow-up PHQ-9 in 3-4 weeks via video visit for dosage assessment. - Send prescription to [facility]. - Encourage finding an Hospital doctor.  Hypertension Elevated blood pressure likely stress-related. Currently on losartan  12.5mg  daily.. - Advise taking an extra half tablet of losartan  if blood pressure exceeds 130/80 mmHg.  Follow Up Instructions: I discussed the assessment and treatment plan with the patient. The patient was provided an opportunity to ask questions and all were answered. The patient agreed with the plan and demonstrated an understanding of the instructions.  A copy of instructions were sent to the patient via MyChart unless otherwise noted below.     The patient was advised to call back or seek an in-person evaluation if the symptoms worsen or if the condition fails to improve as anticipated.    Jezlyn Westerfield W Wisdom Seybold, NP

## 2023-09-12 ENCOUNTER — Other Ambulatory Visit: Payer: Self-pay

## 2023-09-12 NOTE — Patient Instructions (Signed)
 Visit Information  Thank you for taking time to visit with me today. Please don't hesitate to contact me if I can be of assistance to you before our next scheduled appointment.  Your next care management appointment is by telephone on 09/18/2023 at 10AM   Please call the care guide team at [phone] if you need to cancel, schedule, or reschedule an appointment.   Please call the Suicide and Crisis Lifeline: 988 go to [facility] Urgent [facility] [address] [place] [phone]) call 911 if you are experiencing a Mental Health or Behavioral Health Crisis or need someone to talk to.  Laymon Doll, BSW [place]/VBCI - [employer] Social Worker [phone]

## 2023-09-12 NOTE — Patient Outreach (Addendum)
 Complex Care Management   Visit Note  09/12/2023  Name:  [name] MRN: [id number] DOB: March 08, 1965  Situation: Referral received for Complex Care Management related to SDOH Barriers:  Housing help with past due rent to avoid eviction Food insecurity I obtained verbal consent from Patient.  Visit completed with patient  on the phone  Background:   Past Medical History:  Diagnosis Date   Anemia    Anxiety    Arthritis    Asthma    hx as child - no prob as adult - no inhaler   Blood transfusion 01/22/11   transfusion 2 units at [facility]   Depression 08/2010   psych assessment   Dyspnea    Fibroid    Headache(784.0)    rx for imitrex - last one jan   Keloid    Lymphedema    Pulmonary embolism (HCC)     Assessment: BSW held initial call with patient over the phone. Patient was alert and cognitive. SDOH needs were assessed and the following needs were identified: rental assistance with past due rent, dental needs, food insecurity. Patient states her apartment complex has given her a 30 notice to vacant. That notice expires on 07/18 and patient is expected to pay her past due rent or eviction paperwork will be initiated. BSW and patient spoke with apartment complex staff regarding her situation. Complex stated she could pay last months rent and late fee [phone]) and another 30 day notice to vacant would be issued to her which would buy her more time. Patient states she has $500 she can contribute towards rent and will work to obtain the remaining $185 from family members. Patient states her bank account was hacked and she does not not receive her SSDI payment towards the end of the month. Patient also reports no food in the home at the moment. Patient was referred to  home delivery program with [employer] and referral form was sent to Devere cox ([email or web address]). Patient was also provided with food pantry resources and mobile food markets through out of the garden project via  email along with Legal Aid phone number. No additional resources provided at this time.   SDOH Interventions    Flowsheet Row Patient Outreach Telephone from 09/04/2023 in [place] POPULATION HEALTH DEPARTMENT Patient Outreach Telephone from 08/03/2023 in [place] POPULATION HEALTH DEPARTMENT Office Visit from 06/13/2023 in [facility] [place] - [facility] Dept Of [place] Telephone from 05/23/2023 in [place] POPULATION HEALTH DEPARTMENT Care Coordination from 05/18/2023 in [facility] Community Care Coordination Telephone from 05/15/2023 in [place] POPULATION HEALTH DEPARTMENT  SDOH Interventions        Food Insecurity Interventions Intervention Not Indicated Intervention Not Indicated -- -- Intervention Not Indicated --  Housing Interventions AMB Referral  [BSW] Intervention Not Indicated -- -- Intervention Not Indicated --  Transportation Interventions -- -- -- Other (Comment)  [Received email confirmation from [place], patient's application has been approved.Called patient to verify she has been contacted by [facility], she responded she had. Verified patient's email to send confirmation from [employer] which includes their contact number.] -- Other (Comment)  [name] is a [employer] in [place] system but the address is different. I asked the patient and she has used [employer] before and her address was different. She will need to call them to verify her address and have it changed in the system and renew service.]  Utilities Interventions Intervention Not Indicated Intervention Not Indicated -- --  Intervention Not Indicated --  Depression Interventions/Treatment  Medication Medication Referral to Psychiatry -- -- --      Recommendation:   Pay past due rent for June to avoid eviction paperwork. Use food pantries/access mobile food markets.   Follow Up Plan:   Telephone follow up appointment date/time:  09/18/2023 at 10AM  [name], [place]/VBCI -  [facility] Social Worker [phone]

## 2023-09-13 ENCOUNTER — Telehealth: Payer: Self-pay | Admitting: Nurse Practitioner

## 2023-09-13 NOTE — Telephone Encounter (Signed)
Contacted pt confirmed appt

## 2023-09-13 NOTE — Telephone Encounter (Signed)
 Called pt to confirm appt. Pt will be present.

## 2023-09-14 ENCOUNTER — Ambulatory Visit: Attending: Nurse Practitioner | Admitting: Nurse Practitioner

## 2023-09-14 ENCOUNTER — Encounter: Payer: Self-pay | Admitting: Nurse Practitioner

## 2023-09-14 VITALS — BP 167/75 | HR 90 | Resp 19 | Ht 72.0 in | Wt 381.4 lb

## 2023-09-14 DIAGNOSIS — Z86711 Personal history of pulmonary embolism: Secondary | ICD-10-CM | POA: Diagnosis not present

## 2023-09-14 DIAGNOSIS — J45909 Unspecified asthma, uncomplicated: Secondary | ICD-10-CM | POA: Diagnosis not present

## 2023-09-14 DIAGNOSIS — J4520 Mild intermittent asthma, uncomplicated: Secondary | ICD-10-CM | POA: Diagnosis not present

## 2023-09-14 DIAGNOSIS — E7800 Pure hypercholesterolemia, unspecified: Secondary | ICD-10-CM

## 2023-09-14 DIAGNOSIS — Z7901 Long term (current) use of anticoagulants: Secondary | ICD-10-CM | POA: Diagnosis not present

## 2023-09-14 DIAGNOSIS — R7303 Prediabetes: Secondary | ICD-10-CM | POA: Diagnosis not present

## 2023-09-14 DIAGNOSIS — L97228 Non-pressure chronic ulcer of left calf with other specified severity: Secondary | ICD-10-CM | POA: Diagnosis not present

## 2023-09-14 DIAGNOSIS — I872 Venous insufficiency (chronic) (peripheral): Secondary | ICD-10-CM | POA: Diagnosis not present

## 2023-09-14 DIAGNOSIS — F419 Anxiety disorder, unspecified: Secondary | ICD-10-CM | POA: Diagnosis not present

## 2023-09-14 DIAGNOSIS — M1990 Unspecified osteoarthritis, unspecified site: Secondary | ICD-10-CM | POA: Diagnosis not present

## 2023-09-14 DIAGNOSIS — D649 Anemia, unspecified: Secondary | ICD-10-CM

## 2023-09-14 DIAGNOSIS — F32A Depression, unspecified: Secondary | ICD-10-CM

## 2023-09-14 DIAGNOSIS — E785 Hyperlipidemia, unspecified: Secondary | ICD-10-CM | POA: Diagnosis not present

## 2023-09-14 DIAGNOSIS — I890 Lymphedema, not elsewhere classified: Secondary | ICD-10-CM | POA: Diagnosis not present

## 2023-09-14 DIAGNOSIS — I10 Essential (primary) hypertension: Secondary | ICD-10-CM

## 2023-09-14 MED ORDER — ALBUTEROL SULFATE HFA 108 (90 BASE) MCG/ACT IN AERS: 2.0000 | INHALATION_SPRAY | Freq: Four times a day (QID) | RESPIRATORY_TRACT | 2 refills | Status: AC | PRN

## 2023-09-14 NOTE — Progress Notes (Signed)
 Assessment & Plan:  [name] was seen today for depression.  Diagnoses and all orders for this visit:  Anxiety and depression Continue wellbutrin  and restart lexapro .   Mild intermittent asthma without complication Symptoms well maintained -     albuterol  (VENTOLIN  HFA) 108 (90 Base) MCG/ACT inhaler; Inhale 2 puffs into the lungs every 6 (six) hours as needed for wheezing or shortness of breath.  Hypercholesterolemia -     Lipid panel INSTRUCTIONS: Work on a low fat, heart healthy diet and participate in regular aerobic exercise program by working out at least 150 minutes per week; 5 days a week-30 minutes per day. Avoid red meat/beef/steak,  fried foods. junk foods, sodas, sugary drinks, unhealthy snacking, alcohol and smoking.  Drink at least 80 oz of water  per day and monitor your carbohydrate intake daily.    Anemia, unspecified type -     CBC with Differential  Primary hypertension Continue all antihypertensives as prescribed.  Reminded to bring in blood pressure log for follow  up appointment.  RECOMMENDATIONS: DASH/Mediterranean Diets are healthier choices for HTN.      Patient has been counseled on age-appropriate routine health concerns for screening and prevention. These are reviewed and up-to-date. Referrals have been placed accordingly. Immunizations are up-to-date or declined.    Subjective:   Chief Complaint  Patient presents with   Depression    [name] 58 y.o. female presents to office today for follow up to Anxiety and Depression   She has a past medical history of Anemia, Anxiety, OA,  Asthma, Urinary incontinence with foley in place,  Depression (08/2010), Uterine fibroids, Keloid, Lymphedema (followed by the lymphedema/wound care clinic), and Pulmonary embolism (on chronic Eliquis ) positive rheumatoid factor (followed by rheumatology).   [name] was started on buproprion 09-07-2023. At that time she was already taking lexapro  10 mg. She is here  today for follow up. Although her scores have improved she was not aware that lexapro  was to be taken in conjunction with buproprion so currently she is only taking buproprion.     09/14/2023   11:23 AM 09/04/2023    2:40 PM 08/03/2023    2:04 PM  Depression screen PHQ 2/9  Decreased Interest 2 2 1   Down, Depressed, Hopeless 2 2 1   PHQ - 2 Score 4 4 2   Altered sleeping 1 2 1   Tired, decreased energy 1 2 2   Change in appetite 2 2   Feeling bad or failure about yourself  2 2 3   Trouble concentrating 1 2 0  Moving slowly or fidgety/restless 0 0 0  Suicidal thoughts 1 1 0  PHQ-9 Score 12 15 8   Difficult doing work/chores Somewhat difficult         09/14/2023   11:24 AM 06/13/2023   11:34 AM 03/14/2023    3:33 PM 02/09/2023   12:15 PM  GAD 7 : Generalized Anxiety Score  Nervous, Anxious, on Edge 1 0 0 0  Control/stop worrying 1 0 0 0  Worry too much - different things 0 0 0 1  Trouble relaxing 1 2 0 1  Restless 0 0 0 0  Easily annoyed or irritable 1 0 1 1  Afraid - awful might happen 1 0 0 0  Total GAD 7 Score 5 2 1 3   Anxiety Difficulty Somewhat difficult Somewhat difficult  Not difficult at all    In regard to her lymphedema she is also concerned about increased weeping in the left leg despite wearing her  compression wrapping as instructed. Her shoe is saturated with drainage from the leg. She has a nurse coming to the home to rewrap her legs today however I was able to clean her shoe out and add some absorbent padding to the shoe for her to leave in today. She did let the clinic know about the increased drainage from her left leg. Recommendations were to continue current therapy.   Review of Systems  Constitutional:  Negative for fever, malaise/fatigue and weight loss.  HENT: Negative.  Negative for nosebleeds.   Eyes: Negative.  Negative for blurred vision, double vision and photophobia.  Respiratory: Negative.  Negative for cough and shortness of breath.   Cardiovascular:  Negative.  Negative for chest pain, palpitations and leg swelling.  Gastrointestinal: Negative.  Negative for heartburn, nausea and vomiting.  Genitourinary:        Urinary incontinence  Musculoskeletal: Negative.  Negative for myalgias.  Skin:        SEE HPI  Neurological: Negative.  Negative for dizziness, focal weakness, seizures and headaches.  Psychiatric/Behavioral:  Positive for depression. Negative for suicidal ideas. The patient has insomnia.     Past Medical History:  Diagnosis Date   Anemia    Anxiety    Arthritis    Asthma    hx as child - no prob as adult - no inhaler   Blood transfusion 01/22/11   transfusion 2 units at [facility]   Depression 08/2010   psych assessment   Dyspnea    Fibroid    Headache(784.0)    rx for imitrex - last one jan   Keloid    Lymphedema    Pulmonary embolism [facility])     Past Surgical History:  Procedure Laterality Date   ABDOMINAL HYSTERECTOMY     COLONOSCOPY N/A 11/08/2017   Procedure: COLONOSCOPY;  Surgeon: Lauren Margo CROME, MD;  Location: [facility];  Service: Endoscopy;  Laterality: N/A;  2:00pm   FLEXIBLE SIGMOIDOSCOPY N/A 09/17/2017   Procedure: FLEXIBLE SIGMOIDOSCOPY;  Surgeon: Lauren Margo CROME, MD;  Location: [facility];  Service: Endoscopy;  Laterality: N/A;   HERNIA REPAIR  2009   umbilical   POLYPECTOMY  11/08/2017   Procedure: POLYPECTOMY;  Surgeon: Lauren Margo CROME, MD;  Location: [facility];  Service: Endoscopy;;  ascending colon (CSx1), transverse colon (CS x1), splenic flexure (HSx1)   SVD     Spontaneous vaginal delivery; x 1    Family History  Problem Relation Age of Onset   Heart disease Mother    Hypertension Mother    Hypertension Father    Colon cancer Father 86       Passed away 31 yrs old   Hypertension Sister    Cancer Maternal Grandmother        gastric cancer   Cancer Maternal Grandfather        pancreatic cancer   Colon cancer Paternal Grandmother    Colon cancer Paternal Uncle    Colon polyps  Neg Hx     Social History Reviewed with no changes to be made today.   Outpatient Medications Prior to Visit  Medication Sig Dispense Refill   apixaban  (ELIQUIS ) 5 MG TABS tablet Take 1 tablet (5 mg total) by mouth 2 (two) times daily. 180 tablet 3   buPROPion  (WELLBUTRIN  XL) 150 MG 24 hr tablet Take 1 tablet (150 mg total) by mouth daily. 30 tablet 1   Certolizumab Pegol (CIMZIA PREFILLED [place]) Inject 250 mg into the skin every 14 (fourteen)  days.     Ergocalciferol  50 MCG (2000 UT) TABS Take 2,000 Units by mouth daily.     escitalopram  (LEXAPRO ) 10 MG tablet Take 1 tablet (10 mg total) by mouth daily. For depression (Patient not taking: Reported on 09/14/2023) 90 tablet 1   ezetimibe  (ZETIA ) 10 MG tablet Take 1 tablet (10 mg total) by mouth daily. For high cholesterol 90 tablet 3   famotidine  (PEPCID ) 40 MG tablet Take 1 tablet by mouth once daily 90 tablet 0   folic acid  (FOLVITE ) 1 MG tablet Take 1 tablet by mouth once daily 90 tablet 0   gabapentin  (NEURONTIN ) 100 MG capsule Take 100 mg by mouth 3 (three) times daily.     losartan  (COZAAR ) 25 MG tablet Take 0.5 tablets (12.5 mg total) by mouth at bedtime. 45 tablet 4   meclizine  (ANTIVERT ) 12.5 MG tablet Take 1-2 tablets (12.5-25 mg total) by mouth 3 (three) times daily as needed for dizziness. 60 tablet 0   Methotrexate Sodium (METHOTREXATE, PF,) 50 MG/2ML injection Inject into the vein once a week.     Misc. Devices (BARIATRIC ROLLATOR) MISC Please provide patient with insurance approved bariatric rollator walker. I89.0, R53.81, R26.81 1 each 0   predniSONE  (DELTASONE ) 5 MG tablet Take 5-10 mg by mouth daily.     albuterol  (VENTOLIN  HFA) 108 (90 Base) MCG/ACT inhaler Inhale 2 puffs into the lungs every 6 (six) hours as needed for wheezing or shortness of breath. 18 g 2   clindamycin  (CLEOCIN ) 300 MG capsule Take 1 capsule (300 mg total) by mouth 4 (four) times daily. X 7 days (Patient not taking: Reported on 09/14/2023) 28 capsule 0    loperamide (IMODIUM A-D) 2 MG tablet Take 2 mg by mouth 4 (four) times daily as needed for diarrhea or loose stools. (Patient not taking: Reported on 09/14/2023)     No facility-administered medications prior to visit.    Allergies  Allergen Reactions   Abatacept Anaphylaxis    Other reaction(s): Anaphylaxis-Streptomycin Clickject Other reaction(s): Anaphylaxis-Streptomycin   Bee Venom Anaphylaxis   Cefadroxil  Diarrhea   Augmentin  [Amoxicillin -Pot Clavulanate] Hives, Itching and Other (See Comments)    Did PCN reaction causing immediate rash, facial/tongue/throat swelling, SOB or lightheadedness with hypotension: yes Did PCN reaction causing severe rash involving mucus membranes or skin necrosis: no Has patient had a PCN reaction that required hospitalization: in hospital Has patient had a PCN reaction occurring within the last 10 years: no If all of the above answers are NO, then may proceed with Cephalosporin use.  Pt reports no recollection of any reactions when taking penicillin in past   Latex Hives and Other (See Comments)    Local reaction (welts). Patient denies any wheezing or other reaction with latex exposure   Nulytely  [Peg 3350 -Kcl-Na Bicarb-Nacl]     NAUSEA AND VOMITING. MAY TOLERATE LOW VOLUME PREP.       Objective:    BP (!) 167/75 (BP Location: Left Arm, Patient Position: Sitting, Cuff Size: Normal)   Pulse 90   Resp 19   Ht 6' (1.829 m)   Wt (!) 381 lb 6.4 oz (173 kg)   LMP 01/31/2011   SpO2 100%   BMI 51.73 kg/m  Wt Readings from Last 3 Encounters:  09/14/23 (!) 381 lb 6.4 oz (173 kg)  07/09/23 (!) 328 lb 7.8 oz (149 kg)  06/13/23 (!) 329 lb (149.2 kg)    Physical Exam Vitals and nursing note reviewed.  Constitutional:      Appearance: She  is well-developed.  HENT:     Head: Normocephalic and atraumatic.  Cardiovascular:     Rate and Rhythm: Normal rate and regular rhythm.     Heart sounds: Normal heart sounds. No murmur heard.    No friction  rub. No gallop.  Pulmonary:     Effort: Pulmonary effort is normal. No tachypnea or respiratory distress.     Breath sounds: Normal breath sounds. No decreased breath sounds, wheezing, rhonchi or rales.  Chest:     Chest wall: No tenderness.  Musculoskeletal:        General: Normal range of motion.     Cervical back: Normal range of motion.  Skin:    General: Skin is warm and dry.  Neurological:     Mental Status: She is alert and oriented to person, place, and time.     Coordination: Coordination normal.  Psychiatric:        Behavior: Behavior normal. Behavior is cooperative.        Thought Content: Thought content normal.        Judgment: Judgment normal.          Patient has been counseled extensively about nutrition and exercise as well as the importance of adherence with medications and regular follow-up. The patient was given clear instructions to go to ER or return to medical center if symptoms don't improve, worsen or new problems develop. The patient verbalized understanding.   Follow-up: Return in about 3 months (around 12/15/2023).   [name], FNP-BC [facility] [place], [place] [phone]   09/14/2023, 10:40 PM

## 2023-09-18 ENCOUNTER — Observation Stay (HOSPITAL_COMMUNITY)
Admission: EM | Admit: 2023-09-18 | Discharge: 2023-09-20 | Disposition: A | Discharge status: Home Health | Attending: Internal Medicine | Admitting: Internal Medicine

## 2023-09-18 ENCOUNTER — Emergency Department (HOSPITAL_COMMUNITY)
Admit: 2023-09-18 | Discharge: 2023-09-18 | Disposition: A | Discharge status: Home / Self Care | Attending: Emergency Medicine

## 2023-09-18 ENCOUNTER — Other Ambulatory Visit: Payer: Self-pay

## 2023-09-18 ENCOUNTER — Encounter (HOSPITAL_COMMUNITY): Payer: Self-pay | Admitting: Emergency Medicine

## 2023-09-18 DIAGNOSIS — I10 Essential (primary) hypertension: Secondary | ICD-10-CM | POA: Insufficient documentation

## 2023-09-18 DIAGNOSIS — Z86711 Personal history of pulmonary embolism: Secondary | ICD-10-CM | POA: Insufficient documentation

## 2023-09-18 DIAGNOSIS — M069 Rheumatoid arthritis, unspecified: Secondary | ICD-10-CM | POA: Insufficient documentation

## 2023-09-18 DIAGNOSIS — M79662 Pain in left lower leg: Secondary | ICD-10-CM | POA: Diagnosis not present

## 2023-09-18 DIAGNOSIS — L97228 Non-pressure chronic ulcer of left calf with other specified severity: Secondary | ICD-10-CM | POA: Diagnosis not present

## 2023-09-18 DIAGNOSIS — Z7901 Long term (current) use of anticoagulants: Secondary | ICD-10-CM | POA: Diagnosis not present

## 2023-09-18 DIAGNOSIS — H02859 Elephantiasis of unspecified eye, unspecified eyelid: Secondary | ICD-10-CM | POA: Insufficient documentation

## 2023-09-18 DIAGNOSIS — R609 Edema, unspecified: Secondary | ICD-10-CM | POA: Diagnosis not present

## 2023-09-18 DIAGNOSIS — I959 Hypotension, unspecified: Secondary | ICD-10-CM | POA: Diagnosis not present

## 2023-09-18 DIAGNOSIS — D649 Anemia, unspecified: Secondary | ICD-10-CM | POA: Diagnosis not present

## 2023-09-18 DIAGNOSIS — Z6841 Body Mass Index (BMI) 40.0 and over, adult: Secondary | ICD-10-CM | POA: Diagnosis not present

## 2023-09-18 DIAGNOSIS — I890 Lymphedema, not elsewhere classified: Secondary | ICD-10-CM | POA: Diagnosis not present

## 2023-09-18 DIAGNOSIS — Z91040 Latex allergy status: Secondary | ICD-10-CM | POA: Insufficient documentation

## 2023-09-18 DIAGNOSIS — L03116 Cellulitis of left lower limb: Secondary | ICD-10-CM | POA: Diagnosis not present

## 2023-09-18 DIAGNOSIS — L97909 Non-pressure chronic ulcer of unspecified part of unspecified lower leg with unspecified severity: Secondary | ICD-10-CM | POA: Diagnosis not present

## 2023-09-18 DIAGNOSIS — F419 Anxiety disorder, unspecified: Secondary | ICD-10-CM | POA: Diagnosis not present

## 2023-09-18 DIAGNOSIS — J45909 Unspecified asthma, uncomplicated: Secondary | ICD-10-CM | POA: Diagnosis not present

## 2023-09-18 DIAGNOSIS — L0390 Cellulitis, unspecified: Secondary | ICD-10-CM | POA: Diagnosis present

## 2023-09-18 DIAGNOSIS — M1990 Unspecified osteoarthritis, unspecified site: Secondary | ICD-10-CM | POA: Diagnosis not present

## 2023-09-18 DIAGNOSIS — N179 Acute kidney failure, unspecified: Secondary | ICD-10-CM | POA: Diagnosis not present

## 2023-09-18 DIAGNOSIS — E785 Hyperlipidemia, unspecified: Secondary | ICD-10-CM | POA: Diagnosis not present

## 2023-09-18 DIAGNOSIS — M79605 Pain in left leg: Secondary | ICD-10-CM | POA: Diagnosis present

## 2023-09-18 DIAGNOSIS — R7303 Prediabetes: Secondary | ICD-10-CM | POA: Diagnosis not present

## 2023-09-18 DIAGNOSIS — F32A Depression, unspecified: Secondary | ICD-10-CM | POA: Diagnosis not present

## 2023-09-18 DIAGNOSIS — I872 Venous insufficiency (chronic) (peripheral): Secondary | ICD-10-CM | POA: Diagnosis not present

## 2023-09-18 LAB — COMPREHENSIVE METABOLIC PANEL WITH GFR
ALT: 13 U/L (ref 0–44)
AST: 23 U/L (ref 15–41)
Albumin: 3.4 g/dL — ABNORMAL LOW (ref 3.5–5.0)
Alkaline Phosphatase: 42 U/L (ref 38–126)
Anion gap: 13 (ref 5–15)
BUN: 23 mg/dL — ABNORMAL HIGH (ref 6–20)
CO2: 23 mmol/L (ref 22–32)
Calcium: 9.2 mg/dL (ref 8.9–10.3)
Chloride: 99 mmol/L (ref 98–111)
Creatinine, Ser: 1.36 mg/dL — ABNORMAL HIGH (ref 0.44–1.00)
GFR, Estimated: 46 mL/min — ABNORMAL LOW (ref >60)
Glucose, Bld: 90 mg/dL (ref 70–99)
Potassium: 4 mmol/L (ref 3.5–5.1)
Sodium: 135 mmol/L (ref 135–145)
Total Bilirubin: 2.6 mg/dL — ABNORMAL HIGH (ref 0.0–1.2)
Total Protein: 7.8 g/dL (ref 6.5–8.1)

## 2023-09-18 LAB — CBC WITH DIFFERENTIAL/PLATELET
Abs Immature Granulocytes: 0.18 K/uL — ABNORMAL HIGH (ref 0.00–0.07)
Basophils Absolute: 0 x10E3/uL (ref 0.0–0.2)
Basophils Absolute: 0 K/uL (ref 0.0–0.1)
Basophils Relative: 0 %
Basos: 0 %
EOS (ABSOLUTE): 0.2 x10E3/uL (ref 0.0–0.4)
Eos: 2 %
Eosinophils Absolute: 0.3 K/uL (ref 0.0–0.5)
Eosinophils Relative: 2 %
HCT: 33.8 % — ABNORMAL LOW (ref 36.0–46.0)
Hematocrit: 37.9 % (ref 34.0–46.6)
Hemoglobin: 11.6 g/dL (ref 11.1–15.9)
Hemoglobin: 9.9 g/dL — ABNORMAL LOW (ref 12.0–15.0)
Immature Grans (Abs): 0.1 x10E3/uL (ref 0.0–0.1)
Immature Granulocytes: 1 %
Immature Granulocytes: 2 %
Lymphocytes Absolute: 0.9 x10E3/uL (ref 0.7–3.1)
Lymphocytes Relative: 7 %
Lymphs Abs: 0.9 K/uL (ref 0.7–4.0)
Lymphs: 12 %
MCH: 28 pg (ref 26.6–33.0)
MCH: 28.3 pg (ref 26.0–34.0)
MCHC: 30.6 g/dL — ABNORMAL LOW (ref 31.5–35.7)
MCHC: 29.3 g/dL — ABNORMAL LOW (ref 30.0–36.0)
MCV: 91 fL (ref 79–97)
MCV: 96.6 fL (ref 80.0–100.0)
Monocytes Absolute: 0.7 x10E3/uL (ref 0.1–0.9)
Monocytes Absolute: 0.9 K/uL (ref 0.1–1.0)
Monocytes Relative: 7 %
Monocytes: 9 %
Neutro Abs: 9.8 K/uL — ABNORMAL HIGH (ref 1.7–7.7)
Neutrophils Absolute: 5.6 x10E3/uL (ref 1.4–7.0)
Neutrophils Relative %: 82 %
Neutrophils: 76 %
Platelets: 337 x10E3/uL (ref 150–450)
Platelets: 193 K/uL (ref 150–400)
RBC: 4.15 x10E6/uL (ref 3.77–5.28)
RBC: 3.5 MIL/uL — ABNORMAL LOW (ref 3.87–5.11)
RDW: 15.4 % (ref 11.7–15.4)
RDW: 17.7 % — ABNORMAL HIGH (ref 11.5–15.5)
WBC: 7.4 x10E3/uL (ref 3.4–10.8)
WBC: 12 K/uL — ABNORMAL HIGH (ref 4.0–10.5)
nRBC: 0 % (ref 0.0–0.2)

## 2023-09-18 LAB — LIPID PANEL
Chol/HDL Ratio: 2.2 ratio (ref 0.0–4.4)
Cholesterol, Total: 173 mg/dL (ref 100–199)
HDL: 77 mg/dL (ref >39)
LDL Chol Calc (NIH): 85 mg/dL (ref 0–99)
Triglycerides: 55 mg/dL (ref 0–149)
VLDL Cholesterol Cal: 11 mg/dL (ref 5–40)

## 2023-09-18 MED ORDER — SODIUM CHLORIDE 0.9 % IV SOLN
2.0000 g | Freq: Once | INTRAVENOUS | Status: AC
  Administered 2023-09-18: 2 g via INTRAVENOUS
  Filled 2023-09-18: qty 20

## 2023-09-18 MED ORDER — EZETIMIBE 10 MG PO TABS
10.0000 mg | ORAL_TABLET | Freq: Every day | ORAL | Status: DC
  Administered 2023-09-18 – 2023-09-20 (×3): 10 mg via ORAL
  Filled 2023-09-18 (×3): qty 1

## 2023-09-18 MED ORDER — SODIUM CHLORIDE 0.9% FLUSH
3.0000 mL | Freq: Two times a day (BID) | INTRAVENOUS | Status: DC
  Administered 2023-09-18 – 2023-09-20 (×4): 3 mL via INTRAVENOUS

## 2023-09-18 MED ORDER — ACETAMINOPHEN 325 MG PO TABS
650.0000 mg | ORAL_TABLET | Freq: Four times a day (QID) | ORAL | Status: DC | PRN
  Administered 2023-09-18 – 2023-09-19 (×2): 650 mg via ORAL
  Filled 2023-09-18 (×2): qty 2

## 2023-09-18 MED ORDER — SODIUM CHLORIDE 0.9% FLUSH: 3.0000 mL | INTRAVENOUS | Status: DC | PRN

## 2023-09-18 MED ORDER — ALBUTEROL SULFATE (2.5 MG/3ML) 0.083% IN NEBU: 3.0000 mL | INHALATION_SOLUTION | Freq: Four times a day (QID) | RESPIRATORY_TRACT | Status: DC | PRN

## 2023-09-18 MED ORDER — HYDROMORPHONE HCL 1 MG/ML IJ SOLN
0.5000 mg | INTRAMUSCULAR | Status: DC | PRN
  Administered 2023-09-18: 1 mg via INTRAVENOUS
  Filled 2023-09-18: qty 1

## 2023-09-18 MED ORDER — FOLIC ACID 1 MG PO TABS
1.0000 mg | ORAL_TABLET | Freq: Every day | ORAL | Status: DC
  Administered 2023-09-18 – 2023-09-20 (×3): 1 mg via ORAL
  Filled 2023-09-18 (×3): qty 1

## 2023-09-18 MED ORDER — LOSARTAN POTASSIUM 25 MG PO TABS
12.5000 mg | ORAL_TABLET | Freq: Every day | ORAL | Status: DC
  Administered 2023-09-18 – 2023-09-19 (×2): 12.5 mg via ORAL
  Filled 2023-09-18 (×2): qty 1

## 2023-09-18 MED ORDER — SODIUM CHLORIDE 0.9 % IV BOLUS
1000.0000 mL | Freq: Once | INTRAVENOUS | Status: AC
  Administered 2023-09-18: 1000 mL via INTRAVENOUS

## 2023-09-18 MED ORDER — ONDANSETRON HCL 4 MG/2ML IJ SOLN
4.0000 mg | Freq: Four times a day (QID) | INTRAMUSCULAR | Status: DC | PRN
Start: 2023-09-18 — End: 2023-09-20

## 2023-09-18 MED ORDER — OXYCODONE HCL 5 MG PO TABS
5.0000 mg | ORAL_TABLET | ORAL | Status: DC | PRN
  Administered 2023-09-18 – 2023-09-19 (×2): 5 mg via ORAL
  Filled 2023-09-18 (×2): qty 1

## 2023-09-18 MED ORDER — BUPROPION HCL ER (XL) 150 MG PO TB24
150.0000 mg | ORAL_TABLET | Freq: Every day | ORAL | Status: DC
  Administered 2023-09-18 – 2023-09-20 (×3): 150 mg via ORAL
  Filled 2023-09-18 (×3): qty 1

## 2023-09-18 MED ORDER — PREDNISONE 5 MG PO TABS
5.0000 mg | ORAL_TABLET | Freq: Every day | ORAL | Status: DC
  Administered 2023-09-18: 5 mg via ORAL
  Administered 2023-09-19 – 2023-09-20 (×2): 10 mg via ORAL
  Filled 2023-09-18 (×3): qty 2

## 2023-09-18 MED ORDER — GABAPENTIN 100 MG PO CAPS
100.0000 mg | ORAL_CAPSULE | Freq: Three times a day (TID) | ORAL | Status: DC
  Administered 2023-09-18 – 2023-09-20 (×5): 100 mg via ORAL
  Filled 2023-09-18 (×5): qty 1

## 2023-09-18 MED ORDER — APIXABAN 5 MG PO TABS
5.0000 mg | ORAL_TABLET | Freq: Two times a day (BID) | ORAL | Status: DC
Start: 2023-09-18 — End: 2023-09-20
  Administered 2023-09-18 – 2023-09-20 (×4): 5 mg via ORAL
  Filled 2023-09-18 (×4): qty 1

## 2023-09-18 MED ORDER — ONDANSETRON HCL 4 MG PO TABS: 4.0000 mg | ORAL_TABLET | Freq: Four times a day (QID) | ORAL | Status: DC | PRN

## 2023-09-18 MED ORDER — ACETAMINOPHEN 650 MG RE SUPP: 650.0000 mg | Freq: Four times a day (QID) | RECTAL | Status: DC | PRN

## 2023-09-18 MED ORDER — FAMOTIDINE 20 MG PO TABS
40.0000 mg | ORAL_TABLET | Freq: Every day | ORAL | Status: DC
  Administered 2023-09-18 – 2023-09-20 (×3): 40 mg via ORAL
  Filled 2023-09-18 (×3): qty 2

## 2023-09-18 MED ORDER — SODIUM CHLORIDE 0.9 % IV SOLN: 250.0000 mL | INTRAVENOUS | Status: AC | PRN

## 2023-09-18 MED ORDER — ESCITALOPRAM OXALATE 10 MG PO TABS
10.0000 mg | ORAL_TABLET | Freq: Every day | ORAL | Status: DC
  Administered 2023-09-18 – 2023-09-20 (×3): 10 mg via ORAL
  Filled 2023-09-18 (×3): qty 1

## 2023-09-18 NOTE — Consult Note (Signed)
 WOC team consulted for open areas to buttocks.  Also noted to have history of lymphedema followed at wound care clinic.  Secure chat to primary team to request photo documentation of wounds.   Please note that the [facility] nursing team is utilizing a standardized work plan to manage patient consults. We are triaging consults and will try to see the patients within 48 hours. Wound photos in the patient's chart allow us  to consult on the patient in the most efficient and timely manner.    Thank you,    Andry Bogden MSN, RN-BC, CWOCN

## 2023-09-18 NOTE — ED Provider Notes (Signed)
 [facility] EMERGENCY DEPARTMENT AT [facility] Provider Note  CSN: [id number] Arrival date & time: 09/18/23 1311  Chief Complaint(s) Leg Swelling  HPI TAKOYA JONAS is a 58 y.o. female history of lymphedema, antiphospholipid syndrome, rheumatoid arthritis, on chronic Eliquis  presenting with leg pain.  She reports that she has chronic lymphedema both lower extremities, chronic wounds to posterior left lower extremity.  Over the past few days has had increasing pain, has noticed redness as well as warmth, had fever over the weekend.  Normally can get around but having more difficulty.  No chest pain, shortness of breath.  No abdominal pain.  No new wounds.   Past Medical History Past Medical History:  Diagnosis Date   Anemia    Anxiety    Arthritis    Asthma    hx as child - no prob as adult - no inhaler   Blood transfusion 01/22/11   transfusion 2 units at [facility]   Depression 08/2010   psych assessment   Dyspnea    Fibroid    Headache(784.0)    rx for imitrex - last one jan   Keloid    Lymphedema    Pulmonary embolism [facility])    Patient Active Problem List   Diagnosis Date Noted   Cellulitis 12/06/2022   AKI (acute kidney injury) (HCC) 11/07/2022   Murmur, cardiac 11/07/2022   Essential hypertension 11/07/2022   Elevated bilirubin 11/07/2022   Rheumatoid arthritis (HCC) 03/06/2022   Lymphedema 03/06/2022   Morbid obesity (HCC) 09/14/2021   Chest pain 04/07/2021   Screening for malignant neoplasm of colon 03/09/2021   Constipation 03/09/2021   Iron  deficiency anemia 03/09/2021   Body mass index (BMI) 45.0-49.9, adult (HCC) 08/20/2020   Chronic fatigue syndrome 08/20/2020   Polyarthralgia 08/20/2020   Vitamin D  deficiency 08/20/2020   Sepsis due to cellulitis (HCC) 12/31/2018   Elephantiasis 12/31/2018   Ischemic chest pain (HCC) 12/31/2018   History of pulmonary embolus (PE) 12/31/2018   Physical deconditioning 08/27/2018   Adenopathy 09/15/2017   Family  history of colon cancer 08/01/2017   Taking multiple medications for chronic disease 08/01/2017   Axillary lymphadenopathy 01/26/2017   Urinary incontinence 01/25/2017   Hypokalemia 01/25/2017   Lobar pneumonia (HCC) 01/25/2017   Pressure ulcer of sacral region, stage 2 (HCC) 01/25/2017   Abnormal ECG 01/29/2013   Dyspnea    Menorrhagia with regular cycle 02/22/2011   Anemia 02/22/2011   Home Medication(s) Prior to Admission medications   Medication Sig Start Date End Date Taking? Authorizing Provider  albuterol  (VENTOLIN  HFA) 108 (90 Base) MCG/ACT inhaler Inhale 2 puffs into the lungs every 6 (six) hours as needed for wheezing or shortness of breath. 09/14/23   Fleming, [name] W, NP  apixaban  (ELIQUIS ) 5 MG TABS tablet Take 1 tablet (5 mg total) by mouth 2 (two) times daily. 06/13/23   Fleming, [name] W, NP  buPROPion  (WELLBUTRIN  XL) 150 MG 24 hr tablet Take 1 tablet (150 mg total) by mouth daily. 09/07/23   Fleming, [name] W, NP  Certolizumab Pegol [facility] PREFILLED [place]) Inject 250 mg into the skin every 14 (fourteen) days.    [provider]  Ergocalciferol  50 MCG (2000 UT) TABS Take 2,000 Units by mouth daily.    [provider]  escitalopram  (LEXAPRO ) 10 MG tablet Take 1 tablet (10 mg total) by mouth daily. For depression Patient not taking: Reported on 09/14/2023 06/13/23   Fleming, [name] W, NP  ezetimibe  (ZETIA ) 10 MG tablet Take 1 tablet (  10 mg total) by mouth daily. For high cholesterol 06/13/23   Fleming, [name] W, NP  famotidine  (PEPCID ) 40 MG tablet Take 1 tablet by mouth once daily 08/21/23   Fleming, [name] W, NP  folic acid  (FOLVITE ) 1 MG tablet Take 1 tablet by mouth once daily 07/24/23   Newlin, Enobong, MD  gabapentin  (NEURONTIN ) 100 MG capsule Take 100 mg by mouth 3 (three) times daily. 05/31/23 05/30/24  [provider]  losartan  (COZAAR ) 25 MG tablet Take 0.5 tablets (12.5 mg total) by mouth at bedtime. 12/01/22   Thukkani, Arun K, MD  meclizine  (ANTIVERT ) 12.5  MG tablet Take 1-2 tablets (12.5-25 mg total) by mouth 3 (three) times daily as needed for dizziness. 10/11/22   Fleming, [name] W, NP  Methotrexate Sodium (METHOTREXATE, PF,) 50 MG/2ML injection Inject into the vein once a week.    [provider]  Misc. Devices (BARIATRIC ROLLATOR) MISC Please provide patient with insurance approved bariatric rollator walker. I89.0, R53.81, R26.81 11/05/19   Theotis Haze ORN, NP  predniSONE  (DELTASONE ) 5 MG tablet Take 5-10 mg by mouth daily. 07/19/23   [provider]                                                                                                                                    Past Surgical History Past Surgical History:  Procedure Laterality Date   ABDOMINAL HYSTERECTOMY     COLONOSCOPY N/A 11/08/2017   Procedure: COLONOSCOPY;  Surgeon: Harvey Margo CROME, MD;  Location: [facility];  Service: Endoscopy;  Laterality: N/A;  2:00pm   FLEXIBLE SIGMOIDOSCOPY N/A 09/17/2017   Procedure: FLEXIBLE SIGMOIDOSCOPY;  Surgeon: Harvey Margo CROME, MD;  Location: [facility];  Service: Endoscopy;  Laterality: N/A;   HERNIA REPAIR  2009   umbilical   POLYPECTOMY  11/08/2017   Procedure: POLYPECTOMY;  Surgeon: Harvey Margo CROME, MD;  Location: [facility];  Service: Endoscopy;;  ascending colon (CSx1), transverse colon (CS x1), splenic flexure (HSx1)   SVD     Spontaneous vaginal delivery; x 1   Family History Family History  Problem Relation Age of Onset   Heart disease Mother    Hypertension Mother    Hypertension Father    Colon cancer Father 3       Passed away 71 yrs old   Hypertension Sister    Cancer Maternal Grandmother        gastric cancer   Cancer Maternal Grandfather        pancreatic cancer   Colon cancer Paternal Grandmother    Colon cancer Paternal Uncle    Colon polyps Neg Hx     Social History Social History   Tobacco Use   Smoking status: Never   Smokeless tobacco: Never  Vaping Use   Vaping status:  Never Used  Substance Use Topics   Alcohol use: No   Drug use: No   Allergies Abatacept,  Bee venom, Cefadroxil , Augmentin  [amoxicillin -pot clavulanate], Latex, and Nulytely  [peg 3350 -kcl-na bicarb-nacl]  Review of Systems Review of Systems  All other systems reviewed and are negative.   Physical Exam Vital Signs  I have reviewed the triage vital signs BP 131/69 (BP Location: Left Arm)   Pulse 90   Temp 98.7 F (37.1 C) (Oral)   Resp 18   LMP 01/31/2011   SpO2 100%  Physical Exam Vitals and nursing note reviewed.  Constitutional:      General: She is not in acute distress.    Appearance: She is well-developed. She is obese.  HENT:     Head: Normocephalic and atraumatic.     Mouth/Throat:     Mouth: Mucous membranes are moist.  Eyes:     Pupils: Pupils are equal, round, and reactive to light.  Cardiovascular:     Rate and Rhythm: Normal rate and regular rhythm.     Heart sounds: No murmur heard. Pulmonary:     Effort: Pulmonary effort is normal. No respiratory distress.     Breath sounds: Normal breath sounds.  Abdominal:     General: Abdomen is flat.     Palpations: Abdomen is soft.     Tenderness: There is no abdominal tenderness.  Musculoskeletal:     Comments: Bilateral lateral changes of lymphedema with chronic skin thickening.  Left upper shin with confluent erythema, warmth, tenderness, wrapping around posterior calf with underlying chronic appearing wounds without purulence or erythema  Skin:    General: Skin is warm and dry.  Neurological:     General: No focal deficit present.     Mental Status: She is alert. Mental status is at baseline.  Psychiatric:        Mood and Affect: Mood normal.        Behavior: Behavior normal.     ED Results and Treatments Labs (all labs ordered are listed, but only abnormal results are displayed) Labs Reviewed  CBC WITH DIFFERENTIAL/PLATELET - Abnormal; Notable for the following components:      Result Value   WBC  12.0 (*)    RBC 3.50 (*)    Hemoglobin 9.9 (*)    HCT 33.8 (*)    MCHC 29.3 (*)    RDW 17.7 (*)    Neutro Abs 9.8 (*)    Abs Immature Granulocytes 0.18 (*)    All other components within normal limits  COMPREHENSIVE METABOLIC PANEL WITH GFR - Abnormal; Notable for the following components:   BUN 23 (*)    Creatinine, Ser 1.36 (*)    Albumin 3.4 (*)    Total Bilirubin 2.6 (*)    GFR, Estimated 46 (*)    All other components within normal limits                                                                                                                          Radiology VAS US  LOWER EXTREMITY VENOUS (DVT) (7a-7p) Result Date: 09/18/2023  Lower Venous DVT Study Patient Name:  LOUIE [name]  Date of Exam:   09/18/2023 Medical Rec #: [id number]          Accession #:    [id number] Date of Birth: 11-18-1965         Patient Gender: F Patient Age:   7 years Exam Location:  [facility] Procedure:      VAS US  LOWER EXTREMITY VENOUS (DVT) Referring Phys: [facility] SMOOT --------------------------------------------------------------------------------  Indications: Pain.  Risk Factors: None identified. Anticoagulation: Eliquis . Limitations: Body habitus, poor ultrasound/tissue interface, patient positioning, bandages and open wound. Comparison Study: Remote history of right sided DVT. Performing Technologist: Cordella Collet RVT  Examination Guidelines: A complete evaluation includes B-mode imaging, spectral Doppler, color Doppler, and power Doppler as needed of all accessible portions of each vessel. Bilateral testing is considered an integral part of a complete examination. Limited examinations for reoccurring indications may be performed as noted. The reflux portion of the exam is performed with the patient in reverse Trendelenburg.  +-----+---------------+---------+-----------+----------+--------------+ RIGHTCompressibilityPhasicitySpontaneityPropertiesThrombus Aging  +-----+---------------+---------+-----------+----------+--------------+ CFV  Full           Yes      Yes                                 +-----+---------------+---------+-----------+----------+--------------+   +---------+---------------+---------+-----------+----------+-------------------+ LEFT     CompressibilityPhasicitySpontaneityPropertiesThrombus Aging      +---------+---------------+---------+-----------+----------+-------------------+ CFV      Full           Yes      Yes                                      +---------+---------------+---------+-----------+----------+-------------------+ SFJ      Full                                                             +---------+---------------+---------+-----------+----------+-------------------+ FV Prox  Full                                                             +---------+---------------+---------+-----------+----------+-------------------+ FV Mid   Full                                                             +---------+---------------+---------+-----------+----------+-------------------+ FV Distal               Yes      Yes                                      +---------+---------------+---------+-----------+----------+-------------------+ PFV  Not well visualized +---------+---------------+---------+-----------+----------+-------------------+ POP      Full           Yes      Yes                                      +---------+---------------+---------+-----------+----------+-------------------+ PTV                                                   Not well visualized +---------+---------------+---------+-----------+----------+-------------------+ PERO                                                  Not well visualized +---------+---------------+---------+-----------+----------+-------------------+     Summary: RIGHT: - No  evidence of common femoral vein obstruction.   LEFT: - There is no evidence of deep vein thrombosis in the lower extremity. However, portions of this examination were limited- see technologist comments above.  - No cystic structure found in the popliteal fossa.  *See table(s) above for measurements and observations. Electronically signed by Norman Serve on 09/18/2023 at 3:26:20 PM.    Final     Pertinent labs & imaging results that were available during my care of the patient were reviewed by me and considered in my medical decision making (see MDM for details).  Medications Ordered in ED Medications  cefTRIAXone  (ROCEPHIN ) 2 g in sodium chloride  0.9 % 100 mL IVPB (has no administration in time range)  sodium chloride  0.9 % bolus 1,000 mL (has no administration in time range)                                                                                                                                     Procedures Procedures  (including critical care time)  Medical Decision Making / ED Course   MDM:  58 year old presenting to the emergency department with rash, pain.  Patient overall well-appearing, physical examination notable for chronic lymphedema with signs of cellulitis.  DVT ultrasound was obtained by triage provider negative for DVT.  Patient also on chronic Eliquis .  Low concern for other process such as abscess, necrotizing infection.  Given patient's underlying lymphedema, limited ability to walk given worsening symptoms, suspect patient will need admission.  Discussed with hospitalist.  Clinical Course as of 09/18/23 1727  Tue Sep 18, 2023  1726 Discussed with Dr. Jadine hospitalist who has admitted patient [WS]    Clinical Course User Index [WS] Francesca Elsie CROME, MD     Additional history obtained:  -External records from outside source obtained and reviewed including: Chart review including previous  notes, labs, imaging, consultation notes including prior  echo   Lab Tests: -I ordered, reviewed, and interpreted labs.   The pertinent results include:   Labs Reviewed  CBC WITH DIFFERENTIAL/PLATELET - Abnormal; Notable for the following components:      Result Value   WBC 12.0 (*)    RBC 3.50 (*)    Hemoglobin 9.9 (*)    HCT 33.8 (*)    MCHC 29.3 (*)    RDW 17.7 (*)    Neutro Abs 9.8 (*)    Abs Immature Granulocytes 0.18 (*)    All other components within normal limits  COMPREHENSIVE METABOLIC PANEL WITH GFR - Abnormal; Notable for the following components:   BUN 23 (*)    Creatinine, Ser 1.36 (*)    Albumin 3.4 (*)    Total Bilirubin 2.6 (*)    GFR, Estimated 46 (*)    All other components within normal limits    Notable for mild leukocytosis, mild AKI      Imaging Studies ordered: I ordered imaging studies including DVT US   On my interpretation imaging demonstrates no DVT I independently visualized and interpreted imaging. I agree with the radiologist interpretation   Medicines ordered and prescription drug management: Meds ordered this encounter  Medications   cefTRIAXone  (ROCEPHIN ) 2 g in sodium chloride  0.9 % 100 mL IVPB    Antibiotic Indication::   Cellulitis   sodium chloride  0.9 % bolus 1,000 mL    -I have reviewed the patients home medicines and have made adjustments as needed   Consultations Obtained: I requested consultation with the hospitalist,  and discussed lab and imaging findings as well as pertinent plan - they recommend: admission   Cardiac Monitoring: The patient was maintained on a cardiac monitor.  I personally viewed and interpreted the cardiac monitored which showed an underlying rhythm of: NSR  Social Determinants of Health:  Diagnosis or treatment significantly limited by social determinants of health: obesity and lives alone   Reevaluation: After the interventions noted above, I reevaluated the patient and found that their symptoms have improved  Co morbidities that complicate the  patient evaluation  Past Medical History:  Diagnosis Date   Anemia    Anxiety    Arthritis    Asthma    hx as child - no prob as adult - no inhaler   Blood transfusion 01/22/11   transfusion 2 units at [facility]   Depression 08/2010   psych assessment   Dyspnea    Fibroid    Headache(784.0)    rx for imitrex - last one jan   Keloid    Lymphedema    Pulmonary embolism (HCC)       Dispostion: Disposition decision including need for hospitalization was considered, and patient admitted to the hospital.    Final Clinical Impression(s) / ED Diagnoses Final diagnoses:  Cellulitis of left lower extremity     This chart was dictated using voice recognition software.  Despite best efforts to proofread,  errors can occur which can change the documentation meaning.    Francesca Elsie CROME, MD 09/18/23 1727    Francesca Elsie CROME, MD 09/19/23 1027

## 2023-09-18 NOTE — Progress Notes (Signed)
 Left lower extremity venous duplex has been completed. Preliminary results can be found in CV Proc through chart review.  Results were given to Lauraine Pickett PA.  09/18/23 2:13 PM Cathlyn Collet RVT

## 2023-09-18 NOTE — ED Notes (Signed)
 Waiting on IV for placement  from IV team to give fluids and ABX

## 2023-09-18 NOTE — Progress Notes (Signed)
 VAST consult received to obtain IV access. Contacted ER nurse who reported patient needs labs drawn. Currently, no medications/ IVFs/studies ordered for IV placement. Notified ER RN to contact phlebotomy for lab draw.

## 2023-09-18 NOTE — Patient Outreach (Signed)
 Complex Care Management   Visit Note  09/18/2023  Name:  [name] MRN: [id number] DOB: [birth year]  Situation: Referral received for Complex Care Management related to SDOH Barriers:  Housing rental assistance Food insecurity I obtained verbal consent from Patient.  Visit completed with patient  on the phone  Background:   Past Medical History:  Diagnosis Date   Anemia    Anxiety    Arthritis    Asthma    hx as child - no prob as adult - no inhaler   Blood transfusion 01/22/11   transfusion 2 units at [facility]   Depression 08/2010   psych assessment   Dyspnea    Fibroid    Headache(784.0)    rx for imitrex - last one jan   Keloid    Lymphedema    Pulmonary embolism (HCC)     Assessment: BSW held f/u call with patient. Patient was alert and cognitive. Patient reports she was able to pay her June 2025 past due rent ($685) and now only owes July 2025. By doing this, per our conversation with Apartment complex, buys her more time to wait for her SSDI payment at the end of the month. Patient agreed to call complex and confirm she will be receiving another 30-day notice and update BSW on that conversation. BSW provided patient with contact information to [place]. [employer] helping [employer] [phone]) for financial assistance with rent. Patient was informed of next available application period set for 08/11 9AM. Patient will be calling to obtain appointment to apply. BSW will be reaching out to confirm how to apply online and f/u with patient. Patient confirmed she received BSW resources for food and will attempt to access via bus system or transportation from daughter. BSW also referred patient to [place]. [employer] and informed her of dates/time and phone number [phone]). BSW and patient reviewed together upcoming food distributions for mobile food market and will share August distribution schedule with patient via email. Patient understood and agreed. No other resources  provided at this time.   SDOH Interventions    Flowsheet Row Patient Outreach Telephone from 09/04/2023 in [place] POPULATION HEALTH DEPARTMENT Patient Outreach Telephone from 08/03/2023 in [place] POPULATION HEALTH DEPARTMENT Office Visit from 06/13/2023 in [facility] [place] [facility] Of [place] Telephone from 05/23/2023 in [place] POPULATION HEALTH DEPARTMENT Care Coordination from 05/18/2023 in [facility] Community Care Coordination Telephone from 05/15/2023 in [place] POPULATION HEALTH DEPARTMENT  SDOH Interventions        Food Insecurity Interventions Intervention Not Indicated Intervention Not Indicated -- -- Intervention Not Indicated --  Housing Interventions AMB Referral  [BSW] Intervention Not Indicated -- -- Intervention Not Indicated --  Transportation Interventions -- -- -- Other (Comment)  [Received email confirmation from [place], patient's application has been approved.Called patient to verify she has been contacted by [facility], she responded she had. Verified patient's email to send confirmation from [employer] which includes their contact number.] -- Other (Comment)  [name] is a [employer] in [place] system but the address is different. I asked the patient and she has used [employer] before and her address was different. She will need to call them to verify her address and have it changed in the system and renew service.]  Utilities Interventions Intervention Not Indicated Intervention Not Indicated -- -- Intervention Not Indicated --  Depression Interventions/Treatment  Medication Medication Referral to Psychiatry -- -- --  Recommendation:   Call APT complex to confirm new 30-day notice and update BSW on conversation.    Follow Up Plan:   Telephone follow up appointment date/time:  10/03/2023 at 10:15am  Laymon Doll, BSW [place] Social Worker [phone]

## 2023-09-18 NOTE — ED Provider Triage Note (Signed)
 Emergency Medicine Provider Triage Evaluation Note  [name] , [name] 58 y.o. female  was evaluated in triage.  Pt complains of left lower leg pain/swelling. Reports swelling x 7 years, no significant change but has had new pain and redness over the past few days. Hx PE on Eliquis , denies any missed doses. Does have chronic lower leg wounds, denies significant change in these.   Review of Systems  Positive:  Negative:   Physical Exam  BP 131/69 (BP Location: Left Arm)   Pulse 90   Temp 98.7 F (37.1 C) (Oral)   Resp 18   LMP 01/31/2011   SpO2 100%  Gen:   Awake, no distress   Resp:  Normal effort  MSK:   Moves extremities without difficulty  Other:  Profound swelling bilaterally, erythema and warmth noted to the LLE with TTP over the calf area.   Medical Decision Making  Medically screening exam initiated at 1:33 PM.  Appropriate orders placed.  [name] was informed that the remainder of the evaluation will be completed by another provider, this initial triage assessment does not replace that evaluation, and the importance of remaining in the ED until their evaluation is complete.     Lauren Bergevin A, PA-C 09/18/23 1336

## 2023-09-18 NOTE — H&P (Signed)
 History and Physical    Patient: [name] FMW:[id number] DOB: 05/21/1965 DOA: 09/18/2023 DOS: the patient was seen and examined on 09/18/2023 PCP: Theotis Haze ORN, NP  Patient coming from: Home  Chief Complaint:  Chief Complaint  Patient presents with   Leg Swelling   HPI: 58 year old woman PMH including lymphedema, bilateral lower extremity chronic wounds, rheumatoid arthritis, lymphedema bilateral lower extremities, presents to the emergency department with 4-day history of increasing left lower extremity pain, warmth.  Admitted for left lower extremity cellulitis complicated by lymphedema.  Difficulty walking and using left lower extremity secondary to pain, symptoms began 4 days ago, no injury noted.  She has had multiple infections in the left lower extremity in the past.  She feels like she has had fever at home.  Review of Systems: She does report a sore throat, had some shortness of breath requiring an inhaler but she has asthma, couple episodes of diarrhea.  Otherwise as above.  Past Medical History:  Diagnosis Date   Anemia    Anxiety    Arthritis    Asthma    hx as child - no prob as adult - no inhaler   Blood transfusion 01/22/11   transfusion 2 units at [facility]   Depression 08/2010   psych assessment   Dyspnea    Fibroid    Headache(784.0)    rx for imitrex - last one jan   Keloid    Lymphedema    Pulmonary embolism [facility])    Past Surgical History:  Procedure Laterality Date   ABDOMINAL HYSTERECTOMY     COLONOSCOPY N/A 11/08/2017   Procedure: COLONOSCOPY;  Surgeon: Harvey Margo CROME, MD;  Location: [facility] ENDO SUITE;  Service: Endoscopy;  Laterality: N/A;  2:00pm   FLEXIBLE SIGMOIDOSCOPY N/A 09/17/2017   Procedure: FLEXIBLE SIGMOIDOSCOPY;  Surgeon: Harvey Margo CROME, MD;  Location: [facility] ENDO [facility];  Service: Endoscopy;  Laterality: N/A;   HERNIA REPAIR  2009   umbilical   POLYPECTOMY  11/08/2017   Procedure: POLYPECTOMY;  Surgeon: Harvey Margo CROME, MD;  Location: [facility]  ENDO SUITE;  Service: Endoscopy;;  ascending colon (CSx1), transverse colon (CS x1), splenic flexure (HSx1)   SVD     Spontaneous vaginal delivery; x 1   Social History:  reports that she has never smoked. She has never used smokeless tobacco. She reports that she does not drink alcohol and does not use drugs.  Allergies  Allergen Reactions   Abatacept Anaphylaxis    Other reaction(s): Anaphylaxis-Streptomycin Clickject Other reaction(s): Anaphylaxis-Streptomycin   Bee Venom Anaphylaxis   Cefadroxil  Diarrhea   Augmentin  [Amoxicillin -Pot Clavulanate] Hives, Itching and Other (See Comments)    Did PCN reaction causing immediate rash, facial/tongue/throat swelling, SOB or lightheadedness with hypotension: yes Did PCN reaction causing severe rash involving mucus membranes or skin necrosis: no Has patient had a PCN reaction that required hospitalization: in hospital Has patient had a PCN reaction occurring within the last 10 years: no If all of the above answers are NO, then may proceed with Cephalosporin use.  Pt reports no recollection of any reactions when taking penicillin in past   Latex Hives and Other (See Comments)    Local reaction (welts). Patient denies any wheezing or other reaction with latex exposure   Nulytely  [Peg 3350 -Kcl-Na Bicarb-Nacl]     NAUSEA AND VOMITING. MAY TOLERATE LOW VOLUME PREP.    Family History  Problem Relation Age of Onset   Heart disease Mother    Hypertension Mother    Hypertension  Father    Colon cancer Father 63       Passed away 58 yrs old   Hypertension Sister    Cancer Maternal Grandmother        gastric cancer   Cancer Maternal Grandfather        pancreatic cancer   Colon cancer Paternal Grandmother    Colon cancer Paternal Uncle    Colon polyps Neg Hx     Prior to Admission medications   Medication Sig Start Date End Date Taking? Authorizing Provider  albuterol  (VENTOLIN  HFA) 108 (90 Base) MCG/ACT inhaler Inhale 2 puffs into the  lungs every 6 (six) hours as needed for wheezing or shortness of breath. 09/14/23   Fleming, [name] W, NP  apixaban  (ELIQUIS ) 5 MG TABS tablet Take 1 tablet (5 mg total) by mouth 2 (two) times daily. 06/13/23   Fleming, [name] W, NP  buPROPion  (WELLBUTRIN  XL) 150 MG 24 hr tablet Take 1 tablet (150 mg total) by mouth daily. 09/07/23   Fleming, [name] W, NP  Certolizumab Pegol [facility] PREFILLED [place]) Inject 250 mg into the skin every 14 (fourteen) days.    [provider]  Ergocalciferol  50 MCG (2000 UT) TABS Take 2,000 Units by mouth daily.    [provider]  escitalopram  (LEXAPRO ) 10 MG tablet Take 1 tablet (10 mg total) by mouth daily. For depression Patient not taking: Reported on 09/14/2023 06/13/23   Fleming, [name] W, NP  ezetimibe  (ZETIA ) 10 MG tablet Take 1 tablet (10 mg total) by mouth daily. For high cholesterol 06/13/23   Fleming, [name] W, NP  famotidine  (PEPCID ) 40 MG tablet Take 1 tablet by mouth once daily 08/21/23   Fleming, [name] W, NP  folic acid  (FOLVITE ) 1 MG tablet Take 1 tablet by mouth once daily 07/24/23   Newlin, Enobong, MD  gabapentin  (NEURONTIN ) 100 MG capsule Take 100 mg by mouth 3 (three) times daily. 05/31/23 05/30/24  [provider]  losartan  (COZAAR ) 25 MG tablet Take 0.5 tablets (12.5 mg total) by mouth at bedtime. 12/01/22   Thukkani, Arun K, MD  meclizine  (ANTIVERT ) 12.5 MG tablet Take 1-2 tablets (12.5-25 mg total) by mouth 3 (three) times daily as needed for dizziness. 10/11/22   Fleming, [name] W, NP  Methotrexate Sodium (METHOTREXATE, PF,) 50 MG/2ML injection Inject into the vein once a week.    [provider]  Misc. Devices (BARIATRIC ROLLATOR) MISC Please provide patient with insurance approved bariatric rollator walker. I89.0, R53.81, R26.81 11/05/19   Fleming, [name] W, NP  predniSONE  (DELTASONE ) 5 MG tablet Take 5-10 mg by mouth daily. 07/19/23   [provider]    Physical Exam: Vitals:   09/18/23 1322 09/18/23 1819 09/18/23 1826   BP: 131/69 (!) 141/76   Pulse: 90 (!) 109   Resp: 18    Temp: 98.7 F (37.1 C) 100.2 F (37.9 C)   TempSrc: Oral    SpO2: 100% 99%   Weight:   (!) 171.1 kg  Height:   6' (1.829 m)   Physical Exam Vitals reviewed.  Constitutional:      General: She is not in acute distress.    Appearance: She is not ill-appearing or toxic-appearing.  Cardiovascular:     Rate and Rhythm: Normal rate and regular rhythm.     Heart sounds: Murmur (Right upper sternal border, she reports she is told she has a murmur each time she is sick but this resolves when she is well.) heard.  Pulmonary:  Effort: Pulmonary effort is normal. No respiratory distress.     Breath sounds: No wheezing, rhonchi or rales.  Abdominal:     General: There is no distension.     Palpations: Abdomen is soft.     Tenderness: There is no abdominal tenderness. There is no guarding.  Musculoskeletal:     Right lower leg: Edema present.     Left lower leg: Edema present.     Comments: Massive bilateral lower extremity lymphedema  Skin:    Comments: Left lower extremity obviously has erythema from just above the knee down anterior lower leg.  Quite hot to touch.  Tender to palpation.  No fluctuance noted, no drainage noted.  Bandages will be removed and skin visualized.  Neurological:     Mental Status: She is alert.  Psychiatric:        Mood and Affect: Mood normal.        Behavior: Behavior normal.     Data Reviewed: Creatinine 1.36, remainder CMP unremarkable WBC elevated 12.0 Hemoglobin stable at 9.9 left lower extremity venous Doppler negative  Assessment and Plan: Left lower extremity cellulitis complicated by massive lymphedema Chronic left lower extremity wound Erythematous and warm to touch.  No evidence complicating features.  Currently left lower extremity is bandaged and was not visualized in the emergency department.  Will work with nursing to take down bandage and visualize this evening, patient reports  bandage was changed today and that her home health RN felt like the wound did not look infected at that time. Empiric antibiotics, follow clinical course  Bilateral lower extremity chronic wounds present on admission Both legs bandaged, have requested removal of bandages, these will be visualized this evening by night coverage  Acute kidney injury Baseline creatinine around 0.8.  Admission 1.36. Secondary to acute infection and poor oral intake. IVF, BMP in AM  PMH PE Continue apixaban   Morbid obesity Body mass index is 51.16 kg/m.  Rheumatoid arthritis Hold methotrexate. Continue prednisone    Advance Care Planning: Full  Consults: none  Family Communication: none  Severity of Illness: The appropriate patient status for this patient is OBSERVATION. Observation status is judged to be reasonable and necessary in order to provide the required intensity of service to ensure the patient's safety. The patient's presenting symptoms, physical exam findings, and initial radiographic and laboratory data in the context of their medical condition is felt to place them at decreased risk for further clinical deterioration. Furthermore, it is anticipated that the patient will be medically stable for discharge from the hospital within 2 midnights of admission.   Author: Toribio Door, MD 09/18/2023 7:59 PM  For on call review [email or web address].

## 2023-09-18 NOTE — ED Triage Notes (Signed)
 Patient BIB EMS from home c/o leg pain and swelling x 3 days. Patient repot worsening leg pain today. Patient denies N/V. Patient denies fever. Hx lymphedema

## 2023-09-18 NOTE — Patient Instructions (Signed)
 Visit Information  Thank you for taking time to visit with me today. Please don't hesitate to contact me if I can be of assistance to you before our next scheduled appointment.  Your next care management appointment is by telephone on 10/03/2023 at 10:15am   Please call the care guide team at [phone] if you need to cancel, schedule, or reschedule an appointment.   Please call the Suicide and Crisis Lifeline: 988 go to [facility] [address] [place] [phone]) call 911 if you are experiencing a Mental Health or Behavioral Health Crisis or need someone to talk to.  Laymon Doll, BSW [place]/VBCI - [employer] Social Worker [phone]

## 2023-09-19 DIAGNOSIS — Z86711 Personal history of pulmonary embolism: Secondary | ICD-10-CM | POA: Diagnosis not present

## 2023-09-19 DIAGNOSIS — N179 Acute kidney failure, unspecified: Secondary | ICD-10-CM | POA: Diagnosis not present

## 2023-09-19 DIAGNOSIS — M069 Rheumatoid arthritis, unspecified: Secondary | ICD-10-CM | POA: Diagnosis not present

## 2023-09-19 DIAGNOSIS — L03116 Cellulitis of left lower limb: Secondary | ICD-10-CM | POA: Diagnosis not present

## 2023-09-19 DIAGNOSIS — I890 Lymphedema, not elsewhere classified: Secondary | ICD-10-CM | POA: Diagnosis not present

## 2023-09-19 LAB — CBC
HCT: 28.1 % — ABNORMAL LOW (ref 36.0–46.0)
Hemoglobin: 8.7 g/dL — ABNORMAL LOW (ref 12.0–15.0)
MCH: 28.6 pg (ref 26.0–34.0)
MCHC: 31 g/dL (ref 30.0–36.0)
MCV: 92.4 fL (ref 80.0–100.0)
Platelets: 206 K/uL (ref 150–400)
RBC: 3.04 MIL/uL — ABNORMAL LOW (ref 3.87–5.11)
RDW: 17.3 % — ABNORMAL HIGH (ref 11.5–15.5)
WBC: 13 K/uL — ABNORMAL HIGH (ref 4.0–10.5)
nRBC: 0 % (ref 0.0–0.2)

## 2023-09-19 LAB — BASIC METABOLIC PANEL WITH GFR
Anion gap: 8 (ref 5–15)
BUN: 19 mg/dL (ref 6–20)
CO2: 24 mmol/L (ref 22–32)
Calcium: 8.4 mg/dL — ABNORMAL LOW (ref 8.9–10.3)
Chloride: 102 mmol/L (ref 98–111)
Creatinine, Ser: 1.2 mg/dL — ABNORMAL HIGH (ref 0.44–1.00)
GFR, Estimated: 53 mL/min — ABNORMAL LOW (ref >60)
Glucose, Bld: 146 mg/dL — ABNORMAL HIGH (ref 70–99)
Potassium: 3.7 mmol/L (ref 3.5–5.1)
Sodium: 134 mmol/L — ABNORMAL LOW (ref 135–145)

## 2023-09-19 MED ORDER — HYDROCERIN EX CREA
TOPICAL_CREAM | Freq: Every day | CUTANEOUS | Status: DC
  Filled 2023-09-19: qty 113

## 2023-09-19 MED ORDER — ZINC OXIDE 12.8 % EX OINT: TOPICAL_OINTMENT | CUTANEOUS | Status: DC | PRN

## 2023-09-19 MED ORDER — CEPHALEXIN 500 MG PO CAPS
500.0000 mg | ORAL_CAPSULE | Freq: Four times a day (QID) | ORAL | Status: DC
  Administered 2023-09-19 – 2023-09-20 (×4): 500 mg via ORAL
  Filled 2023-09-19 (×4): qty 1

## 2023-09-19 NOTE — Consult Note (Signed)
 WOC consulted also for buttocks, images reviewed  WOC Nurse Consult Note: Reason for Consult: buttock wound Wound type: irritant contact dermatitis  ICD-10 CM Codes for Irritant Dermatitis L24A2 - Due to fecal, urinary or dual incontinence Pressure Injury POA: NA Measurement:NA Wound bed: scattered partial thickness skin loss consistent with ICD related to moisture and incontinence  Drainage (amount, consistency, odor) see nursing flow sheets Periwound: intact  Dressing procedure/placement/frequency: Continue moisture barrier cream.  Will add triple paste PRN if incontinence is frequent.   Discussed POC with bedside nurse.  Re consult if needed, will not follow at this time. Thanks  Leighton Luster [employer], RN,CWOCN, CNS, [employer] [phone]

## 2023-09-19 NOTE — Care Management Obs Status (Signed)
 MEDICARE OBSERVATION STATUS NOTIFICATION   Patient Details  Name: [name] MRN: [id number] Date of Birth: 1965-08-16   Medicare Observation Status Notification Given:  Yes    Doneta Glenys DASEN, RN 09/19/2023, 12:05 PM

## 2023-09-19 NOTE — Evaluation (Addendum)
 Occupational Therapy Evaluation Patient Details Name: [name] MRN: [id number] DOB: 05/30/1965 Today's Date: 09/19/2023   History of Present Illness   58 yo female presents to Ed 09/18/23 with increased LE edema and pain. PMH: bilateral leg lymohademea(follows with PT). EFY:Izemzddpnw, anemia, anxiety, PE,  rheumatoid arthritis, antiphospholipid, syndrome, chronic lymphedema, chronic LE wounds     Clinical Impressions Pt presents with decline in function and safety with ADLs and ADL mobility with impaired balance and endurance. PTA pt reports that she was Ind with ADLa, though difficult at times with increased time due to fatigue, bird baths because difficulty with getting into tub shower, manages medications, daughter takes her on errands, RW for household distances daily, limited community distances when feeling well. Pt currently requires extra time with use of AD for bed mobility to sit EOB, CGA sit-stand/walking with RW, mod - min A with LB ADLs, min A with toileting tasks and set up/Sup with UB ADLs and grooming/hygiene. Pt reports pain in B LEs as well as fatigue limites her mobility and daily activity level. Pt educated on energy conservation strategies and ADL A/E with handouts provided. OT will follow acutely to maximize level of function and safety     If plan is discharge home, recommend the following:   A lot of help with bathing/dressing/bathroom;A little help with walking and/or transfers;Assistance with cooking/housework;Assist for transportation;Help with stairs or ramp for entrance     Functional Status Assessment   Patient has had a recent decline in their functional status and demonstrates the ability to make significant improvements in function in a reasonable and predictable amount of time.     Equipment Recommendations   Other (comment) (ADL A/E kit)     Recommendations for Other Services         Precautions/Restrictions    Precautions Precautions: Fall;Other (comment) Precaution/Restrictions Comments: fatigues easily, significant LE edema Restrictions Weight Bearing Restrictions Per Provider Order: No     Mobility Bed Mobility Overal bed mobility: Needs Assistance Bed Mobility: Supine to Sit, Sit to Supine     Supine to sit: HOB elevated     General bed mobility comments: used gait belt to help lift legs and advance to LEs to EOB, pt required extra time to complete, min A to assist pt completing L LE onto bed using belt to lift leg    Transfers Overall transfer level: Needs assistance Equipment used: Rolling walker (2 wheels) Transfers: Sit to/from Stand, Bed to chair/wheelchair/BSC Sit to Stand: Contact guard assist           General transfer comment: pt used RW to walk around to other side of bed, RN reports that pt walked to bathroom earlier with RW      Balance Overall balance assessment: Mild deficits observed, not formally tested                                         ADL either performed or assessed with clinical judgement   ADL Overall ADL's : Needs assistance/impaired Eating/Feeding: Independent;Sitting   Grooming: Wash/dry hands;Wash/dry face;Set up;Supervision/safety   Upper Body Bathing: Set up;Supervision/ safety;Sitting   Lower Body Bathing: Sitting/lateral leans;Moderate assistance   Upper Body Dressing : Supervision/safety;Set up;Sitting Upper Body Dressing Details (indicate cue type and reason): mod A to don shoes Lower Body Dressing: Moderate assistance;Sitting/lateral leans;Minimal assistance Lower Body Dressing Details (indicate cue type and reason): min A  to don shoes Toilet Transfer: Contact guard assist;Ambulation;Rolling walker (2 wheels)   Toileting- Clothing Manipulation and Hygiene: Sitting/lateral lean;Sit to/from stand;Minimal assistance       Functional mobility during ADLs: Contact guard assist;Rolling walker (2 wheels) General  ADL Comments: pt educated on energy conservation strategies and ADL A/E with handouts provided     Vision Baseline Vision/History: 1 Wears glasses Patient Visual Report: No change from baseline       Perception         Praxis         Pertinent Vitals/Pain Pain Assessment Pain Assessment: 0-10 Pain Score: 7  Pain Descriptors / Indicators: Burning, Stabbing Pain Intervention(s): Limited activity within patient's tolerance, Premedicated before session, Repositioned, Monitored during session     Extremity/Trunk Assessment Upper Extremity Assessment Upper Extremity Assessment: Generalized weakness;Right hand dominant   Lower Extremity Assessment Lower Extremity Assessment: Defer to PT evaluation   Cervical / Trunk Assessment Cervical / Trunk Assessment: Normal   Communication Communication Communication: No apparent difficulties   Cognition Arousal: Alert Behavior During Therapy: WFL for tasks assessed/performed Cognition: No apparent impairments                               Following commands: Intact       Cueing  General Comments          Exercises     Shoulder Instructions      Home Living Family/patient expects to be discharged to:: Private residence Living Arrangements: Alone Available Help at Discharge: Family;Available PRN/intermittently Type of Home: Apartment Home Access: Level entry     Home Layout: One level     Bathroom Shower/Tub: Tub/shower unit;Sponge bathes at baseline   Bathroom Toilet: Standard Bathroom Accessibility: Yes   Home Equipment: Rollator (4 wheels);BSC/3in1;Shower seat;Adaptive equipment Adaptive Equipment: Reacher        Prior Functioning/Environment Prior Level of Function : Independent/Modified Independent             Mobility Comments: household distances daily, limited community distances when feeling well ADLs Comments: bird baths because difficulty with getting into tub shower, manages  medications, daughter takes her on errands    OT Problem List: Pain;Decreased activity tolerance;Decreased knowledge of use of DME or AE;Obesity;Increased edema   OT Treatment/Interventions: Self-care/ADL training;Therapeutic exercise;Patient/family education;Therapeutic activities;DME and/or AE instruction;Energy conservation      OT Goals(Current goals can be found in the care plan section)   Acute Rehab OT Goals Patient Stated Goal: go home OT Goal Formulation: With patient Time For Goal Achievement: 10/03/23 Potential to Achieve Goals: Good ADL Goals Pt Will Perform Grooming: with set-up;with modified independence Pt Will Perform Lower Body Bathing: with min assist;with contact guard assist;with adaptive equipment Pt Will Perform Lower Body Dressing: with min assist;with contact guard assist;with adaptive equipment Pt Will Transfer to Toilet: with supervision;with modified independence;ambulating Pt Will Perform Toileting - Clothing Manipulation and hygiene: with supervision;with modified independence;sitting/lateral leans;sit to/from stand Additional ADL Goal #1: pt will verbalize and demo 3 energy conservation strategies for ADLs and ADL mobility tasks   OT Frequency:  Min 2X/week    Co-evaluation              AM-PAC OT 6 Clicks Daily Activity     Outcome Measure Help from another person eating meals?: None Help from another person taking care of personal grooming?: A Little Help from another person toileting, which includes using toliet, bedpan, or urinal?: A Little  Help from another person bathing (including washing, rinsing, drying)?: A Little Help from another person to put on and taking off regular upper body clothing?: A Little Help from another person to put on and taking off regular lower body clothing?: A Lot 6 Click Score: 18   End of Session Equipment Utilized During Treatment: Gait belt;Rolling walker (2 wheels) Nurse Communication: Mobility  status  Activity Tolerance: Patient limited by fatigue Patient left: in bed;with call bell/phone within reach  OT Visit Diagnosis: Unsteadiness on feet (R26.81);Other abnormalities of gait and mobility (R26.89);Pain Pain - Right/Left:  (bilaterally) Pain - part of body: Leg                Time: 1331-1402 OT Time Calculation (min): 31 min Charges:  OT General Charges $OT Visit: 1 Visit OT Evaluation $OT Eval Moderate Complexity: 1 Mod    Jacques Karna Loose 09/19/2023, 2:31 PM

## 2023-09-19 NOTE — TOC Initial Note (Signed)
 Transition of Care [facility]) - Initial/Assessment Note    Patient Details  Name: [name] MRN: [id number] Date of Birth: 04-Jan-1966  Transition of Care [facility]) CM/SW Contact:    Doneta Glenys DASEN, RN Phone Number:  Clinical Narrative:                 MOON completed.Presented for cellulitis. PTA lives in an apartment with her mother.Verified PCP/insurance; DME-rollator & BSC;HH with [facility] -RN wound care; SDOH needs for housing is being [employer] SW is working with patient; Patients mother will transport home at discharge. TOC will follow progression to discharge.  Expected Discharge Plan: Home w Home Health Services Barriers to Discharge: Continued Medical Work up   Patient Goals and CMS Choice Patient states their goals for this hospitalization and ongoing recovery are:: Home with mother CMS Medicare.gov Compare Post Acute Care list provided to::  (NA) Choice offered to / list presented to : NA      Expected Discharge Plan and Services In-house Referral: NA Discharge Planning Services: CM Consult Post Acute Care Choice: NA Living arrangements for the past 2 months: Apartment                 DME Arranged: N/A DME Agency: NA       HH Arranged: NA HH Agency: NA        Prior Living Arrangements/Services Living arrangements for the past 2 months: Apartment Lives with:: Parents Patient language and need for interpreter reviewed:: Yes Do you feel safe going back to the place where you live?: Yes      Need for Family Participation in Patient Care: Yes (Comment) Care giver support system in place?: Yes (comment) Current home services: DME (rollator BSC) Criminal Activity/Legal Involvement Pertinent to Current Situation/Hospitalization: No - Comment as needed  Activities of Daily Living   ADL Screening (condition at time of admission) Independently performs ADLs?: Yes (appropriate for developmental age) Is the patient deaf or have difficulty hearing?:  No Does the patient have difficulty seeing, even when wearing glasses/contacts?: No Does the patient have difficulty concentrating, remembering, or making decisions?: No  Permission Sought/Granted Permission sought to share information with : Case Manager Permission granted to share information with : Yes, Verbal Permission Granted  Share Information with NAME: Maeva, Dant (Mother)  [phone]           Emotional Assessment Appearance:: Appears stated age Attitude/Demeanor/Rapport: Engaged Affect (typically observed): Appropriate Orientation: : Oriented to Self, Oriented to Place, Oriented to  Time, Oriented to Situation Alcohol / Substance Use: Not Applicable Psych Involvement: No (comment)  Admission diagnosis:  Cellulitis [L03.90] Cellulitis of left lower extremity [L03.116] Patient Active Problem List   Diagnosis Date Noted   Cellulitis 12/06/2022   AKI (acute kidney injury) (HCC) 11/07/2022   Murmur, cardiac 11/07/2022   Essential hypertension 11/07/2022   Elevated bilirubin 11/07/2022   Rheumatoid arthritis (HCC) 03/06/2022   Lymphedema 03/06/2022   Morbid obesity (HCC) 09/14/2021   Chest pain 04/07/2021   Screening for malignant neoplasm of colon 03/09/2021   Constipation 03/09/2021   Iron  deficiency anemia 03/09/2021   Body mass index (BMI) 45.0-49.9, adult (HCC) 08/20/2020   Chronic fatigue syndrome 08/20/2020   Polyarthralgia 08/20/2020   Vitamin D  deficiency 08/20/2020   Sepsis due to cellulitis (HCC) 12/31/2018   Elephantiasis 12/31/2018   Ischemic chest pain (HCC) 12/31/2018   History of pulmonary embolus (PE) 12/31/2018   Physical deconditioning 08/27/2018   Adenopathy 09/15/2017   Family history of colon cancer 08/01/2017  Taking multiple medications for chronic disease 08/01/2017   Axillary lymphadenopathy 01/26/2017   Urinary incontinence 01/25/2017   Hypokalemia 01/25/2017   Lobar pneumonia (HCC) 01/25/2017   Pressure ulcer of sacral region,  stage 2 (HCC) 01/25/2017   Abnormal ECG 01/29/2013   Dyspnea    Menorrhagia with regular cycle 02/22/2011   Anemia 02/22/2011   PCP:  Theotis Haze ORN, NP Pharmacy:   [facility] [address] [place] - [address] [place] [address] Phone: [phone] Fax: [phone]     Social Drivers of Health (SDOH) Social History: SDOH Screenings   Food Insecurity: No Food Insecurity (09/18/2023)  Housing: High Risk (09/18/2023)  Transportation Needs: No Transportation Needs (09/18/2023)  Utilities: Not At Risk (09/18/2023)  Alcohol Screen: Low Risk  (10/30/2020)  Depression (PHQ2-9): High Risk (09/14/2023)  Financial Resource Strain: Low Risk  (05/14/2023)  Physical Activity: Insufficiently Active (12/06/2022)  Social Connections: Moderately Integrated (12/06/2022)  Stress: No Stress Concern Present (12/06/2022)  Tobacco Use: Low Risk  (09/18/2023)  Health Literacy: Adequate Health Literacy (12/06/2022)   SDOH Interventions: Food Insecurity Interventions: Intervention Not Indicated, Inpatient TOC Housing Interventions: Intervention Not Indicated, Inpatient TOC   Readmission Risk Interventions     No data to display

## 2023-09-19 NOTE — Consult Note (Signed)
 WOC Nurse Consult Note: patient is followed by [facility] last seen 08/29/2023 and utilizing Aquacel AG  Also followed at [facility] PT for management of lymphedema  Reason for Consult: leg wounds  Wound type: partial thickness pink moist r/t lymphedema  Pressure Injury POA: NA  Measurement: see nursing flowsheet; scattered partial and full thickness areas to B lower legs  Wound bed: pink moist  Drainage (amount, consistency, odor) see nursing flowsheet  Periwound: cobblestone skin consistent with lymphedema  Dressing procedure/placement/frequency:  Cleanse B lower legs with soap and water , apply Eucerin to intact skin daily.  Cover any open wound beds with silver hydrofiber (Aquacel ANDRIA Collum [phone]) cut to fit wound bed daily.  Cover with ABD pad and secure with Kerlix roll gauze beginning right above toes and ending right below knees. May cover with Ace bandage wrapped in same fashion as Kerlix for light compression.    Patient should continue ongoing care with [facility] PT for lymphedema management and wound care center for ongoing care of venous ulcerations.     POC discussed with bedside nurse. WOC team also consulted for buttocks wounds, photo documentation has been requested.    Thank you,    Vershawn Westrup MSN, RN-BC, CWOCN

## 2023-09-19 NOTE — Progress Notes (Signed)
 PROGRESS NOTE    [name]  FMW:[id number] DOB: 09/13/1965 DOA: 09/18/2023 PCP: Theotis Haze ORN, NP   Brief Narrative:  58 year old woman PMH including lymphedema, bilateral lower extremity chronic wounds, rheumatoid arthritis, lymphedema bilateral lower extremities, presents to the emergency department with 4-day history of increasing left lower extremity pain, warmth, generalized fevers. Admitted for left lower extremity cellulitis complicated by lymphedema concern for sepsis and disseminated infection.   Assessment & Plan:   Principal Problem:   Cellulitis Active Problems:   Rheumatoid arthritis (HCC)   Elephantiasis   History of pulmonary embolus (PE)   AKI (acute kidney injury) (HCC)  Sepsis, mild, POA  Secondary to left lower extremity cellulitis complicated by massive lymphedema Acute on chronic bilateral lower extremity chronic wounds present on admission Patient febrile with leukocytosis and notable source of infection at intake Continue empiric antibiotics, follow clinical course Continues to be febrile -albeit improving **Patient has prior history of C. difficile, wean off iv antibiotics and follow clinically   Acute kidney injury, improving Baseline creatinine around 0.8.  Admission 1.36. Secondary to acute infection and poor oral intake. Creatinine downtrending appropriately   PMH PE Continue apixaban    Severe morbid obesity Body mass index is 51.16 kg/m.   Rheumatoid arthritis Hold methotrexate. Continue prednisone   DVT prophylaxis: apixaban  (ELIQUIS ) tablet 5 mg  Code Status:   Code Status: Full Code Family Communication: None present  Status is: Inpatient  Dispo: The patient is from: Home              Anticipated d/c is to: Home              Anticipated d/c date is: 24 to 48 hours              Patient currently not medically stable for discharge  Consultants:  None  Procedures:  None  Antimicrobials:   Cephalexin   Subjective: No acute issues or events overnight, continues to be febrile this morning, pain improving but not yet resolved.  Denies nausea vomiting diarrhea constipation any fever chills or chest pain.  Objective: Vitals:   09/19/23 0052 09/19/23 0343 09/19/23 1000 09/19/23 1003  BP: (!) 111/52 (!) 121/57  (!) 114/46  Pulse: (!) 107 99  98  Resp: 18 18  18   Temp: 98.6 F (37 C) 98.3 F (36.8 C) (!) 100.8 F (38.2 C) 99.2 F (37.3 C)  TempSrc: Oral Oral Oral Oral  SpO2: 96% 100%  98%  Weight:      Height:        Intake/Output Summary (Last 24 hours) at 09/19/2023 1115 Last data filed at 09/19/2023 0900 Gross per 24 hour  Intake --  Output 600 ml  Net -600 ml   Filed Weights   09/18/23 1826  Weight: (!) 171.1 kg    Examination:  General exam: Appears calm and comfortable  Respiratory system: Clear to auscultation. Respiratory effort normal. Cardiovascular system: S1 & S2 heard, RRR. No JVD, murmurs, rubs, gallops or clicks. No pedal edema. Gastrointestinal system: Abdomen is nondistended, soft and nontender. No organomegaly or masses felt. Normal bowel sounds heard. Central nervous system: Alert and oriented. No focal neurological deficits. Extremities: Symmetric 5 x 5 power. Skin: No rashes, lesions or ulcers Psychiatry: Judgement and insight appear normal. Mood & affect appropriate.     Data Reviewed: I have personally reviewed following labs and imaging studies  CBC: Recent Labs  Lab 09/14/23 1155 09/18/23 1551 09/19/23 0434  WBC 7.4 12.0* 13.0*  NEUTROABS 5.6  9.8*  --   HGB 11.6 9.9* 8.7*  HCT 37.9 33.8* 28.1*  MCV 91 96.6 92.4  PLT 337 193 206   Basic Metabolic Panel: Recent Labs  Lab 09/18/23 1551 09/19/23 0434  NA 135 134*  K 4.0 3.7  CL 99 102  CO2 23 24  GLUCOSE 90 146*  BUN 23* 19  CREATININE 1.36* 1.20*  CALCIUM  9.2 8.4*   GFR: Estimated Creatinine Clearance: 93.9 mL/min (A) (by C-G formula based on SCr of 1.2 mg/dL  (H)). Liver Function Tests: Recent Labs  Lab 09/18/23 1551  AST 23  ALT 13  ALKPHOS 42  BILITOT 2.6*  PROT 7.8  ALBUMIN 3.4*   No results for input(s): LIPASE, AMYLASE in the last 168 hours. No results for input(s): AMMONIA in the last 168 hours. Coagulation Profile: No results for input(s): INR, PROTIME in the last 168 hours. Cardiac Enzymes: No results for input(s): CKTOTAL, CKMB, CKMBINDEX, TROPONINI in the last 168 hours. BNP (last 3 results) No results for input(s): PROBNP in the last 8760 hours. HbA1C: No results for input(s): HGBA1C in the last 72 hours. CBG: No results for input(s): GLUCAP in the last 168 hours. Lipid Profile: No results for input(s): CHOL, HDL, LDLCALC, TRIG, CHOLHDL, LDLDIRECT in the last 72 hours. Thyroid  Function Tests: No results for input(s): TSH, T4TOTAL, FREET4, T3FREE, THYROIDAB in the last 72 hours. Anemia Panel: No results for input(s): VITAMINB12, FOLATE, FERRITIN, TIBC, IRON , RETICCTPCT in the last 72 hours. Sepsis Labs: No results for input(s): PROCALCITON, LATICACIDVEN in the last 168 hours.  No results found for this or any previous visit (from the past 240 hours).       Radiology Studies: VAS US  LOWER EXTREMITY VENOUS (DVT) (7a-7p) Result Date: 09/18/2023  Lower Venous DVT Study Patient Name:  [name]  Date of Exam:   09/18/2023 Medical Rec #: [id number]          Accession #:    [id number] Date of Birth: 1965/12/15         Patient Gender: F Patient Age:   43 years Exam Location:  [facility] Procedure:      VAS US  LOWER EXTREMITY VENOUS (DVT) Referring Phys: LAURAINE SMOOT --------------------------------------------------------------------------------  Indications: Pain.  Risk Factors: None identified. Anticoagulation: Eliquis . Limitations: Body habitus, poor ultrasound/tissue interface, patient positioning, bandages and open wound. Comparison Study: Remote  history of right sided DVT. Performing Technologist: Cordella Collet RVT  Examination Guidelines: A complete evaluation includes B-mode imaging, spectral Doppler, color Doppler, and power Doppler as needed of all accessible portions of each vessel. Bilateral testing is considered an integral part of a complete examination. Limited examinations for reoccurring indications may be performed as noted. The reflux portion of the exam is performed with the patient in reverse Trendelenburg.  +-----+---------------+---------+-----------+----------+--------------+ RIGHTCompressibilityPhasicitySpontaneityPropertiesThrombus Aging +-----+---------------+---------+-----------+----------+--------------+ CFV  Full           Yes      Yes                                 +-----+---------------+---------+-----------+----------+--------------+   +---------+---------------+---------+-----------+----------+-------------------+ LEFT     CompressibilityPhasicitySpontaneityPropertiesThrombus Aging      +---------+---------------+---------+-----------+----------+-------------------+ CFV      Full           Yes      Yes                                      +---------+---------------+---------+-----------+----------+-------------------+  SFJ      Full                                                             +---------+---------------+---------+-----------+----------+-------------------+ FV Prox  Full                                                             +---------+---------------+---------+-----------+----------+-------------------+ FV Mid   Full                                                             +---------+---------------+---------+-----------+----------+-------------------+ FV Distal               Yes      Yes                                      +---------+---------------+---------+-----------+----------+-------------------+ PFV                                                    Not well visualized +---------+---------------+---------+-----------+----------+-------------------+ POP      Full           Yes      Yes                                      +---------+---------------+---------+-----------+----------+-------------------+ PTV                                                   Not well visualized +---------+---------------+---------+-----------+----------+-------------------+ PERO                                                  Not well visualized +---------+---------------+---------+-----------+----------+-------------------+     Summary: RIGHT: - No evidence of common femoral vein obstruction.   LEFT: - There is no evidence of deep vein thrombosis in the lower extremity. However, portions of this examination were limited- see technologist comments above.  - No cystic structure found in the popliteal fossa.  *See table(s) above for measurements and observations. Electronically signed by Norman Serve on 09/18/2023 at 3:26:20 PM.    Final     Scheduled Meds:  apixaban   5 mg Oral BID   buPROPion   150 mg Oral Daily   cephALEXin   500 mg Oral Q6H   escitalopram   10 mg Oral Daily   ezetimibe   10 mg Oral Daily   famotidine   40 mg Oral Daily   folic acid   1 mg Oral Daily   gabapentin   100 mg Oral TID   hydrocerin   Topical Daily   losartan   12.5 mg Oral QHS   predniSONE   5-10 mg Oral Daily   sodium chloride  flush  3 mL Intravenous Q12H   sodium chloride  flush  3 mL Intravenous Q12H   Continuous Infusions:  sodium chloride        LOS: 0 days   Time spent:  Elsie JAYSON Montclair, DO [facility]  If 7PM-7AM, please contact night-coverage [email or web address]  09/19/2023, 11:15 AM

## 2023-09-19 NOTE — Evaluation (Signed)
 Physical Therapy Evaluation Patient Details Name: [name] MRN: [id number] DOB: 07/08/1965 Today's Date: 09/19/2023  History of Present Illness  58 yo female presents to Ed 09/18/23 with increased LE edema and pain. PMH: bilateral leg lymohademea(follows with PT). EFY:Izemzddpnw, anemia, anxiety, PE,  rheumatoid arthritis, antiphospholipid, syndrome, chronic lymphedema, chronic LE wounds  Clinical Impression  Pt admitted with above diagnosis.  Pt currently with functional limitations due to the deficits listed below (see PT Problem List). Pt will benefit from acute skilled PT to increase their independence and safety with mobility to allow discharge.     The patient  moves slowly, did provide belt for self assisting the legs  to bed edge and back onto bed.  Patient reports Left leg pain >R. Patient  ambulated x 20' using a RW.   Patient is ambulatory in her home using a rollator, limited leaving home to   OPPT for lymphedema and MD visits.      If plan is discharge home, recommend the following: Assistance with cooking/housework;A little help with bathing/dressing/bathroom;Help with stairs or ramp for entrance;Assist for transportation   Can travel by private vehicle        Equipment Recommendations None recommended by PT  Recommendations for Other Services       Functional Status Assessment Patient has had a recent decline in their functional status and demonstrates the ability to make significant improvements in function in a reasonable and predictable amount of time.     Precautions / Restrictions Precautions Precautions: Fall;Other (comment) Precaution/Restrictions Comments: fatigues easily, significant LE edema Restrictions Weight Bearing Restrictions Per Provider Order: No Other Position/Activity Restrictions: does have ortho shoes in room      Mobility  Bed Mobility               General bed mobility comments: used gait belt to help lift legs and advance  to LEs to EOB, pt required extra time to complete, min A to assist pt completing L LE onto bed using belt to lift leg    Transfers Overall transfer level: Needs assistance Equipment used: Rolling walker (2 wheels) Transfers: Sit to/from Stand, Bed to chair/wheelchair/BSC Sit to Stand: Contact guard assist           General transfer comment: patient used belt to self assist each leg off if bed and back onto bed    Ambulation/Gait Ambulation/Gait assistance: Contact guard assist Gait Distance (Feet): 20 Feet Assistive device: Rolling walker (2 wheels) Gait Pattern/deviations: Step-to pattern       General Gait Details: very slow pace  Stairs            Wheelchair Mobility     Tilt Bed    Modified Rankin (Stroke Patients Only)       Balance Overall balance assessment: Mild deficits observed, not formally tested                                           Pertinent Vitals/Pain Pain Assessment Pain Assessment: 0-10 Pain Score: 7  Pain Location: left leg is more than right Pain Descriptors / Indicators: Burning, Stabbing Pain Intervention(s): Monitored during session, Patient requesting pain meds-RN notified, Premedicated before session    Home Living Family/patient expects to be discharged to:: Private residence Living Arrangements: Alone Available Help at Discharge: Family;Available PRN/intermittently Type of Home: Apartment Home Access: Level entry  Home Layout: One level Home Equipment: Rollator (4 wheels);BSC/3in1;Shower seat;Adaptive equipment      Prior Function Prior Level of Function : Independent/Modified Independent             Mobility Comments: household distances daily, limited community distances when feeling well ADLs Comments: bird baths because difficulty with getting into tub shower, manages medications, daughter takes her on errands     Extremity/Trunk Assessment   Upper Extremity Assessment Upper  Extremity Assessment: Generalized weakness    Lower Extremity Assessment Lower Extremity Assessment: RLE deficits/detail;LLE deficits/detail RLE Deficits / Details: significant edema/lymphedema noted  in foot to knee LLE Deficits / Details: more than right , edema, lymphedema in foot, and leg    Cervical / Trunk Assessment Cervical / Trunk Assessment: Normal  Communication   Communication Communication: No apparent difficulties    Cognition                                         Cueing       General Comments      Exercises     Assessment/Plan    PT Assessment Patient needs continued PT services  PT Problem List Decreased strength;Decreased range of motion;Decreased activity tolerance;Decreased mobility;Decreased knowledge of precautions;Obesity;Decreased skin integrity       PT Treatment Interventions DME instruction;Gait training;Functional mobility training;Therapeutic activities;Therapeutic exercise;Patient/family education    PT Goals (Current goals can be found in the Care Plan section)  Acute Rehab PT Goals Patient Stated Goal: feel better PT Goal Formulation: With patient Time For Goal Achievement: 10/03/23 Potential to Achieve Goals: Good    Frequency Min 3X/week     Co-evaluation PT/OT/SLP Co-Evaluation/Treatment: Yes Reason for Co-Treatment: For patient/therapist safety PT goals addressed during session: Mobility/safety with mobility OT goals addressed during session: ADL's and self-care       AM-PAC PT 6 Clicks Mobility  Outcome Measure Help needed turning from your back to your side while in a flat bed without using bedrails?: A Little Help needed moving from lying on your back to sitting on the side of a flat bed without using bedrails?: A Little Help needed moving to and from a bed to a chair (including a wheelchair)?: A Little Help needed standing up from a chair using your arms (e.g., wheelchair or bedside chair)?: A  Little Help needed to walk in hospital room?: A Little Help needed climbing 3-5 steps with a railing? : Total 6 Click Score: 16    End of Session Equipment Utilized During Treatment: Gait belt Activity Tolerance: Patient tolerated treatment well Patient left: in bed;with call bell/phone within reach Nurse Communication: Mobility status PT Visit Diagnosis: Unsteadiness on feet (R26.81)    Time: 1330-1400 PT Time Calculation (min) (ACUTE ONLY): 30 min   Charges:   PT Evaluation $PT Eval Low Complexity: 1 Low   PT General Charges $$ ACUTE PT VISIT: 1 Visit        Darice Potters PT Acute Rehabilitation Services Office [phone]   Potters Darice Norris 09/19/2023, 2:55 PM

## 2023-09-20 ENCOUNTER — Other Ambulatory Visit (HOSPITAL_COMMUNITY): Payer: Self-pay

## 2023-09-20 DIAGNOSIS — L03116 Cellulitis of left lower limb: Secondary | ICD-10-CM | POA: Diagnosis not present

## 2023-09-20 DIAGNOSIS — N179 Acute kidney failure, unspecified: Secondary | ICD-10-CM | POA: Diagnosis not present

## 2023-09-20 DIAGNOSIS — M069 Rheumatoid arthritis, unspecified: Secondary | ICD-10-CM | POA: Diagnosis not present

## 2023-09-20 DIAGNOSIS — I890 Lymphedema, not elsewhere classified: Secondary | ICD-10-CM | POA: Diagnosis not present

## 2023-09-20 DIAGNOSIS — Z86711 Personal history of pulmonary embolism: Secondary | ICD-10-CM | POA: Diagnosis not present

## 2023-09-20 LAB — BASIC METABOLIC PANEL WITH GFR
Anion gap: 5 (ref 5–15)
BUN: 11 mg/dL (ref 6–20)
CO2: 26 mmol/L (ref 22–32)
Calcium: 8.5 mg/dL — ABNORMAL LOW (ref 8.9–10.3)
Chloride: 102 mmol/L (ref 98–111)
Creatinine, Ser: 1.02 mg/dL — ABNORMAL HIGH (ref 0.44–1.00)
GFR, Estimated: >60 mL/min (ref >60)
Glucose, Bld: 103 mg/dL — ABNORMAL HIGH (ref 70–99)
Potassium: 3.4 mmol/L — ABNORMAL LOW (ref 3.5–5.1)
Sodium: 133 mmol/L — ABNORMAL LOW (ref 135–145)

## 2023-09-20 LAB — CBC
HCT: 28.3 % — ABNORMAL LOW (ref 36.0–46.0)
Hemoglobin: 8.9 g/dL — ABNORMAL LOW (ref 12.0–15.0)
MCH: 28.7 pg (ref 26.0–34.0)
MCHC: 31.4 g/dL (ref 30.0–36.0)
MCV: 91.3 fL (ref 80.0–100.0)
Platelets: 235 K/uL (ref 150–400)
RBC: 3.1 MIL/uL — ABNORMAL LOW (ref 3.87–5.11)
RDW: 17.4 % — ABNORMAL HIGH (ref 11.5–15.5)
WBC: 18.1 K/uL — ABNORMAL HIGH (ref 4.0–10.5)
nRBC: 0 % (ref 0.0–0.2)

## 2023-09-20 MED ORDER — CEPHALEXIN 500 MG PO CAPS
500.0000 mg | ORAL_CAPSULE | Freq: Four times a day (QID) | ORAL | 0 refills | Status: DC
  Filled 2023-09-20: qty 28, 7d supply, fill #0

## 2023-09-20 MED ORDER — ACETAMINOPHEN 325 MG PO TABS: 325.0000 mg | ORAL_TABLET | Freq: Four times a day (QID) | ORAL | 0 refills | Status: AC | PRN

## 2023-09-20 NOTE — Discharge Summary (Signed)
 Physician Discharge Summary  [name] FMW:[id number] DOB: 23-Mar-1965 DOA: 09/18/2023  PCP: Theotis Haze ORN, NP  Admit date: 09/18/2023 Discharge date: 09/20/2023  Admitted From: Home Disposition: Home  Recommendations for Outpatient Follow-up:  Follow up with PCP in 1-2 weeks Follow-up with rheumatology as scheduled  Home Health: None Equipment/Devices: None  Discharge Condition: Stable CODE STATUS: Full Diet recommendation: Low-salt low-fat diet  Brief/Interim Summary: 58 year old woman PMH including lymphedema, bilateral lower extremity chronic wounds, rheumatoid arthritis, lymphedema bilateral lower extremities, presents to the emergency department with 4-day history of increasing left lower extremity pain, warmth, generalized fevers. Admitted for left lower extremity cellulitis complicated by lymphedema concern for sepsis and disseminated infection.  Patient admitted as above with concern for disseminated infection, given her resolution of fever, improving labs and clinical exam she is otherwise tolerating p.o. antibiotics quite well.  Plan for discharge home with continue p.o. antibiotic course and close follow-up with rheumatology given chronic skin changes.   Discharge Diagnoses:  Principal Problem:   Cellulitis Active Problems:   Rheumatoid arthritis (HCC)   Elephantiasis   History of pulmonary embolus (PE)   AKI (acute kidney injury) (HCC)  Sepsis, mild, POA, resolving Secondary to left lower extremity cellulitis complicated by massive lymphedema Acute on chronic bilateral lower extremity chronic wounds present on admission Patient febrile with leukocytosis and notable source of infection at intake Continue empiric antibiotics, follow clinical course Continues to be febrile -albeit improving **Patient has prior history of C. difficile, limit antibiotics - follow for diarrhea   Acute kidney injury, improving Baseline creatinine around 0.8.  Admission  1.36. Secondary to acute infection and poor oral intake. Creatinine downtrending appropriately   PMH PE Continue apixaban    Severe morbid obesity Body mass index is 51.16 kg/m.   Rheumatoid arthritis Hold methotrexate. Continue prednisone   Discharge Instructions  Discharge Instructions     Call MD for:  difficulty breathing, headache or visual disturbances   Complete by: As directed    Call MD for:  extreme fatigue   Complete by: As directed    Call MD for:  hives   Complete by: As directed    Call MD for:  persistant dizziness or light-headedness   Complete by: As directed    Call MD for:  persistant nausea and vomiting   Complete by: As directed    Call MD for:  severe uncontrolled pain   Complete by: As directed    Call MD for:  temperature >100.4   Complete by: As directed    Diet - low sodium heart healthy   Complete by: As directed    Discharge wound care:   Complete by: As directed    Cleanse B lower legs with soap and water , apply Eucerin to intact skin daily.  Cover any open wound beds with silver hydrofiber (Aquacel ANDRIA Collum [phone]) cut to fit wound bed daily.  Cover with ABD pad and secure with Kerlix roll gauze beginning right above toes and ending right below knees. May cover with Ace bandage wrapped in same fashion as Kerlix for light compression.   Increase activity slowly   Complete by: As directed       Allergies as of 09/20/2023       Reactions   Abatacept Anaphylaxis, Other (See Comments)   Orencia: Anaphylaxis-Streptomycin Clickject   Bee Venom Anaphylaxis   Cefadroxil  Diarrhea   Augmentin  [amoxicillin -pot Clavulanate] Hives, Itching, Other (See Comments)   Did PCN reaction causing immediate rash, facial/tongue/throat swelling, SOB or lightheadedness  with hypotension: yes Did PCN reaction causing severe rash involving mucus membranes or skin necrosis: no Has patient had a PCN reaction that required hospitalization: in hospital Has patient had  a PCN reaction occurring within the last 10 years: no If all of the above answers are NO, then may proceed with Cephalosporin use. Pt reports no recollection of any reactions when taking penicillin in past   Latex Hives, Other (See Comments)   Local reaction (welts). Patient denies any wheezing or other reaction with latex exposure.   Nulytely  [peg 3350 -kcl-na Bicarb-nacl] Nausea And Vomiting, Other (See Comments)   NAUSEA AND VOMITING. MAY TOLERATE LOW VOLUME PREP.   Tomato Other (See Comments)   Caused sores in the mouth        Medication List     STOP taking these medications    methotrexate (PF) 50 MG/2ML injection       TAKE these medications    acetaminophen  325 MG tablet Commonly known as: TYLENOL  Take 1 tablet (325 mg total) by mouth every 6 (six) hours as needed for mild pain (pain score 1-3), fever or headache (or Fever >/= 101).   albuterol  108 (90 Base) MCG/ACT inhaler Commonly known as: VENTOLIN  HFA Inhale 2 puffs into the lungs every 6 (six) hours as needed for wheezing or shortness of breath.   apixaban  5 MG Tabs tablet Commonly known as: Eliquis  Take 1 tablet (5 mg total) by mouth 2 (two) times daily. What changed: when to take this   Bariatric Rollator Misc Please provide patient with insurance approved bariatric rollator walker. I89.0, R53.81, R26.81   buPROPion  150 MG 24 hr tablet Commonly known as: Wellbutrin  XL Take 1 tablet (150 mg total) by mouth daily. What changed: when to take this   cephALEXin  500 MG capsule Commonly known as: KEFLEX  Take 1 capsule (500 mg total) by mouth every 6 (six) hours for 7 days.   Cimzia (2 Syringe) 200 MG/ML prefilled syringe Generic drug: certolizumab pegol Inject 200 mg into the skin every 14 (fourteen) days.   escitalopram  10 MG tablet Commonly known as: Lexapro  Take 1 tablet (10 mg total) by mouth daily. For depression What changed: when to take this   ezetimibe  10 MG tablet Commonly known as:  Zetia  Take 1 tablet (10 mg total) by mouth daily. For high cholesterol What changed: when to take this   famotidine  40 MG tablet Commonly known as: PEPCID  Take 1 tablet by mouth once daily What changed: when to take this   folic acid  1 MG tablet Commonly known as: FOLVITE  Take 1 tablet by mouth once daily What changed: when to take this   gabapentin  100 MG capsule Commonly known as: NEURONTIN  Take 200 mg by mouth at bedtime.   losartan  25 MG tablet Commonly known as: COZAAR  Take 0.5 tablets (12.5 mg total) by mouth at bedtime. What changed: how much to take   meclizine  12.5 MG tablet Commonly known as: ANTIVERT  Take 1-2 tablets (12.5-25 mg total) by mouth 3 (three) times daily as needed for dizziness.   predniSONE  5 MG tablet Commonly known as: DELTASONE  Take 5-10 mg by mouth at bedtime.   Vitamin D3 50 MCG (2000 UT) Tabs Take 2,000 Units by mouth at bedtime.               Discharge Care Instructions  (From admission, onward)           Start     Ordered   09/20/23 0000  Discharge wound care:  Comments: Cleanse B lower legs with soap and water , apply Eucerin to intact skin daily.  Cover any open wound beds with silver hydrofiber (Aquacel ANDRIA Collum [phone]) cut to fit wound bed daily.  Cover with ABD pad and secure with Kerlix roll gauze beginning right above toes and ending right below knees. May cover with Ace bandage wrapped in same fashion as Kerlix for light compression.   09/20/23 0925            Follow-up Information     [facility] Follow up.   Why: Wil call to set time for appointment to continue wound care. If you have not heard from [facility] tomorrow 7/25/ Friday. Please call them  [phone]               Allergies  Allergen Reactions   Abatacept Anaphylaxis and Other (See Comments)    Orencia: Anaphylaxis-Streptomycin Clickject    Bee Venom Anaphylaxis   Cefadroxil  Diarrhea   Augmentin  [Amoxicillin -Pot  Clavulanate] Hives, Itching and Other (See Comments)    Did PCN reaction causing immediate rash, facial/tongue/throat swelling, SOB or lightheadedness with hypotension: yes Did PCN reaction causing severe rash involving mucus membranes or skin necrosis: no Has patient had a PCN reaction that required hospitalization: in hospital Has patient had a PCN reaction occurring within the last 10 years: no If all of the above answers are NO, then may proceed with Cephalosporin use.  Pt reports no recollection of any reactions when taking penicillin in past   Latex Hives and Other (See Comments)    Local reaction (welts). Patient denies any wheezing or other reaction with latex exposure.   Nulytely  [Peg 3350 -Kcl-Na Bicarb-Nacl] Nausea And Vomiting and Other (See Comments)    NAUSEA AND VOMITING. MAY TOLERATE LOW VOLUME PREP.   Tomato Other (See Comments)    Caused sores in the mouth    Consultations: None   Procedures/Studies: VAS US  LOWER EXTREMITY VENOUS (DVT) (7a-7p) Result Date: 09/18/2023  Lower Venous DVT Study Patient Name:  IVIONNA VERLEY  Date of Exam:   09/18/2023 Medical Rec #: [id number]          Accession #:    [id number] Date of Birth: 1965/09/26         Patient Gender: F Patient Age:   72 years Exam Location:  [facility] Procedure:      VAS US  LOWER EXTREMITY VENOUS (DVT) Referring Phys: LAURAINE SMOOT --------------------------------------------------------------------------------  Indications: Pain.  Risk Factors: None identified. Anticoagulation: Eliquis . Limitations: Body habitus, poor ultrasound/tissue interface, patient positioning, bandages and open wound. Comparison Study: Remote history of right sided DVT. Performing Technologist: Cordella Collet RVT  Examination Guidelines: A complete evaluation includes B-mode imaging, spectral Doppler, color Doppler, and power Doppler as needed of all accessible portions of each vessel. Bilateral testing is considered an integral part  of a complete examination. Limited examinations for reoccurring indications may be performed as noted. The reflux portion of the exam is performed with the patient in reverse Trendelenburg.  +-----+---------------+---------+-----------+----------+--------------+ RIGHTCompressibilityPhasicitySpontaneityPropertiesThrombus Aging +-----+---------------+---------+-----------+----------+--------------+ CFV  Full           Yes      Yes                                 +-----+---------------+---------+-----------+----------+--------------+   +---------+---------------+---------+-----------+----------+-------------------+ LEFT     CompressibilityPhasicitySpontaneityPropertiesThrombus Aging      +---------+---------------+---------+-----------+----------+-------------------+ CFV      Full  Yes      Yes                                      +---------+---------------+---------+-----------+----------+-------------------+ SFJ      Full                                                             +---------+---------------+---------+-----------+----------+-------------------+ FV Prox  Full                                                             +---------+---------------+---------+-----------+----------+-------------------+ FV Mid   Full                                                             +---------+---------------+---------+-----------+----------+-------------------+ FV Distal               Yes      Yes                                      +---------+---------------+---------+-----------+----------+-------------------+ PFV                                                   Not well visualized +---------+---------------+---------+-----------+----------+-------------------+ POP      Full           Yes      Yes                                      +---------+---------------+---------+-----------+----------+-------------------+ PTV                                                    Not well visualized +---------+---------------+---------+-----------+----------+-------------------+ PERO                                                  Not well visualized +---------+---------------+---------+-----------+----------+-------------------+     Summary: RIGHT: - No evidence of common femoral vein obstruction.   LEFT: - There is no evidence of deep vein thrombosis in the lower extremity. However, portions of this examination were limited- see technologist comments above.  - No cystic structure found in the popliteal fossa.  *See table(s) above for measurements and observations. Electronically signed by Norman Serve on 09/18/2023 at 3:26:20  PM.    Final      Subjective: No acute issues/events overnight   Discharge Exam: Vitals:   09/19/23 1225 09/19/23 1950  BP: (!) 119/57 (!) 135/57  Pulse: 90 91  Resp: 18 16  Temp: 98.4 F (36.9 C) 99.3 F (37.4 C)  SpO2: 94% 99%   Vitals:   09/19/23 1000 09/19/23 1003 09/19/23 1225 09/19/23 1950  BP:  (!) 114/46 (!) 119/57 (!) 135/57  Pulse:  98 90 91  Resp:  18 18 16   Temp: (!) 100.8 F (38.2 C) 99.2 F (37.3 C) 98.4 F (36.9 C) 99.3 F (37.4 C)  TempSrc: Oral Oral Oral   SpO2:  98% 94% 99%  Weight:      Height:        General: Pt is alert, awake, not in acute distress Cardiovascular: RRR, S1/S2 +, no rubs, no gallops Respiratory: CTA bilaterally, no wheezing, no rhonchi Abdominal: Soft, NT, ND, bowel sounds + Extremities: Bilateral lower extremity elephantiasis noted, chronic skin changes, minimal edema to left leg, bandage clean dry intact.   The results of significant diagnostics from this hospitalization (including imaging, microbiology, ancillary and laboratory) are listed below for reference.     Microbiology: No results found for this or any previous visit (from the past 240 hours).   Labs: BNP (last 3 results) No results for input(s): BNP in the last 8760  hours. Basic Metabolic Panel: Recent Labs  Lab 09/18/23 1551 09/19/23 0434 09/20/23 0432  NA 135 134* 133*  K 4.0 3.7 3.4*  CL 99 102 102  CO2 23 24 26   GLUCOSE 90 146* 103*  BUN 23* 19 11  CREATININE 1.36* 1.20* 1.02*  CALCIUM  9.2 8.4* 8.5*   Liver Function Tests: Recent Labs  Lab 09/18/23 1551  AST 23  ALT 13  ALKPHOS 42  BILITOT 2.6*  PROT 7.8  ALBUMIN 3.4*   CBC: Recent Labs  Lab 09/14/23 1155 09/18/23 1551 09/19/23 0434 09/20/23 0432  WBC 7.4 12.0* 13.0* 18.1*  NEUTROABS 5.6 9.8*  --   --   HGB 11.6 9.9* 8.7* 8.9*  HCT 37.9 33.8* 28.1* 28.3*  MCV 91 96.6 92.4 91.3  PLT 337 193 206 235   Urinalysis    Component Value Date/Time   COLORURINE COLORLESS (A) 04/08/2021 0453   APPEARANCEUR CLEAR 04/08/2021 0453   LABSPEC 1.006 04/08/2021 0453   PHURINE 8.0 04/08/2021 0453   GLUCOSEU NEGATIVE 04/08/2021 0453   HGBUR SMALL (A) 04/08/2021 0453   BILIRUBINUR NEGATIVE 04/08/2021 0453   BILIRUBINUR negative 01/16/2020 1735   BILIRUBINUR small 03/09/2017 1230   KETONESUR NEGATIVE 04/08/2021 0453   PROTEINUR NEGATIVE 04/08/2021 0453   UROBILINOGEN 0.2 01/16/2020 1735   UROBILINOGEN 1.0 08/30/2010 1456   NITRITE NEGATIVE 04/08/2021 0453   LEUKOCYTESUR NEGATIVE 04/08/2021 0453   Sepsis Labs Recent Labs  Lab 09/14/23 1155 09/18/23 1551 09/19/23 0434 09/20/23 0432  WBC 7.4 12.0* 13.0* 18.1*   Microbiology No results found for this or any previous visit (from the past 240 hours).   Time coordinating discharge: Over 30 minutes  SIGNED:   Elsie JAYSON Montclair, DO [facility] 09/20/2023, 3:17 PM Pager   If 7PM-7AM, please contact night-coverage [email or web address]

## 2023-09-20 NOTE — Progress Notes (Signed)
 Discharge medication delivered in secure bag to patient at bedside [facility]

## 2023-09-20 NOTE — Plan of Care (Signed)

## 2023-09-20 NOTE — TOC Transition Note (Signed)
 Transition of Care [facility]) - Discharge Note   Patient Details  Name: [name] MRN: [id number] Date of Birth: Jan 16, 1966  [facility] CM/SW Contact:  Doneta Glenys DASEN, RN Phone Number: 09/20/2023, 10:00 AM   Clinical Narrative:    Per MD patient is medically ready for discharge home with [facility] RN for continue wound care. CM contacted [facility] [phone] for resumption of care. Patients mother will transport home. TOC signing off.  Final next level of care: Home w Home Health Services [facility] RN wound care) Barriers to Discharge: Barriers Resolved   Patient Goals and CMS Choice Patient states their goals for this hospitalization and ongoing recovery are:: Home with mother CMS Medicare.gov Compare Post Acute Care list provided to::  (NA) Choice offered to / list presented to : NA      Discharge Placement                    Patient and family notified of of transfer: 09/20/23  Discharge Plan and Services Additional resources added to the After Visit Summary for   In-house Referral: NA Discharge Planning Services: CM Consult Post Acute Care Choice: NA          DME Arranged: N/A DME Agency: NA       HH Arranged: RN HH Agency: [facility] Date [facility] Agency Contacted: 09/20/23 Time HH Agency Contacted: 1000    Social Drivers of Health (SDOH) Interventions SDOH Screenings   Food Insecurity: No Food Insecurity (09/18/2023)  Housing: High Risk (09/18/2023)  Transportation Needs: No Transportation Needs (09/18/2023)  Utilities: Not At Risk (09/18/2023)  Alcohol Screen: Low Risk  (10/30/2020)  Depression (PHQ2-9): High Risk (09/14/2023)  Financial Resource Strain: Low Risk  (05/14/2023)  Physical Activity: Insufficiently Active (12/06/2022)  Social Connections: Moderately Integrated (12/06/2022)  Stress: No Stress Concern Present (12/06/2022)  Tobacco Use: Low Risk  (09/18/2023)  Health Literacy: Adequate Health Literacy (12/06/2022)      Readmission Risk Interventions     No data to display

## 2023-09-21 DIAGNOSIS — R7303 Prediabetes: Secondary | ICD-10-CM | POA: Diagnosis not present

## 2023-09-21 DIAGNOSIS — M1990 Unspecified osteoarthritis, unspecified site: Secondary | ICD-10-CM | POA: Diagnosis not present

## 2023-09-21 DIAGNOSIS — I872 Venous insufficiency (chronic) (peripheral): Secondary | ICD-10-CM | POA: Diagnosis not present

## 2023-09-21 DIAGNOSIS — Z7901 Long term (current) use of anticoagulants: Secondary | ICD-10-CM | POA: Diagnosis not present

## 2023-09-21 DIAGNOSIS — L97228 Non-pressure chronic ulcer of left calf with other specified severity: Secondary | ICD-10-CM | POA: Diagnosis not present

## 2023-09-21 DIAGNOSIS — J45909 Unspecified asthma, uncomplicated: Secondary | ICD-10-CM | POA: Diagnosis not present

## 2023-09-21 DIAGNOSIS — I890 Lymphedema, not elsewhere classified: Secondary | ICD-10-CM | POA: Diagnosis not present

## 2023-09-21 DIAGNOSIS — F419 Anxiety disorder, unspecified: Secondary | ICD-10-CM | POA: Diagnosis not present

## 2023-09-21 DIAGNOSIS — Z86711 Personal history of pulmonary embolism: Secondary | ICD-10-CM | POA: Diagnosis not present

## 2023-09-21 DIAGNOSIS — I10 Essential (primary) hypertension: Secondary | ICD-10-CM | POA: Diagnosis not present

## 2023-09-21 DIAGNOSIS — D649 Anemia, unspecified: Secondary | ICD-10-CM | POA: Diagnosis not present

## 2023-09-21 DIAGNOSIS — E785 Hyperlipidemia, unspecified: Secondary | ICD-10-CM | POA: Diagnosis not present

## 2023-09-21 DIAGNOSIS — F32A Depression, unspecified: Secondary | ICD-10-CM | POA: Diagnosis not present

## 2023-09-24 ENCOUNTER — Telehealth: Payer: Self-pay

## 2023-09-24 NOTE — Transitions of Care (Post Inpatient/ED Visit) (Signed)
 09/24/2023  Name: [name] MRN: [id number] DOB: [birth year]  Today's TOC FU Call Status: Today's TOC FU Call Status:: Successful TOC FU Call Completed TOC FU Call Complete Date: 09/24/23 Patient's Name and Date of Birth confirmed.  Transition Care Management Follow-up Telephone Call Date of Discharge: 09/20/23 Discharge Facility: [facility]) Type of Discharge: Inpatient Admission Primary Inpatient Discharge Diagnosis:: cellulitis LLE How have you been since you were released from the hospital?: Same (She said she is still in alot of pain from the cellulitis and the tylenol  doesn't touch the pain.) Any questions or concerns?: Yes Patient Questions/Concerns:: the pain as noted above. Patient Questions/Concerns Addressed: Notified Provider of Patient Questions/Concerns  Items Reviewed: Did you receive and understand the discharge instructions provided?: Yes Medications obtained,verified, and reconciled?: Yes (Medications Reviewed) (She has all medication except the albuterol  inhaler which she said she just need to pick up.) Any new allergies since your discharge?: No Dietary orders reviewed?: Yes Type of Diet Ordered:: heart healthy, low sodium Do you have support at home?: No People in Home [RPT]: alone (She said she can call her daughter if needed.)  Medications Reviewed Today: Medications Reviewed Today     Reviewed by Lauren Bradley, RN (Case Manager) on 09/24/23 at 1729  Med List Status: <None>   Medication Order Taking? Sig Documenting Provider Last Dose Status Informant  acetaminophen  (TYLENOL ) 325 MG tablet [id number]  Take 1 tablet (325 mg total) by mouth every 6 (six) hours as needed for mild pain (pain score 1-3), fever or headache (or Fever >/= 101). Lauren Elsie BROCKS, [name]  Active   albuterol  (VENTOLIN  HFA) [phone] Base) MCG/ACT inhaler [id number]  Inhale 2 puffs into the lungs every 6 (six) hours as needed for wheezing or shortness of breath. [name] ORN, NP  Active Self  apixaban  (ELIQUIS ) 5 MG TABS tablet [id number]  Take 1 tablet (5 mg total) by mouth 2 (two) times daily.  Patient taking differently: Take 5 mg by mouth in the morning and at bedtime.   [name] ORN, NP  Active Self  buPROPion  (WELLBUTRIN  XL) 150 MG 24 hr tablet [id number]  Take 1 tablet (150 mg total) by mouth daily.  Patient taking differently: Take 150 mg by mouth at bedtime.   [name] ORN, NP  Active Self  cephALEXin  (KEFLEX ) 500 MG capsule [id number]  Take 1 capsule (500 mg total) by mouth every 6 (six) hours for 7 days. Lauren Elsie BROCKS, [name]  Active   Cholecalciferol (VITAMIN D3) 50 MCG (2000 UT) TABS [id number]  Take 2,000 Units by mouth at bedtime. Provider, Historical, [name]  Active Self  CIMZIA, 2 SYRINGE, 200 MG/ML prefilled syringe [id number]  Inject 200 mg into the skin every 14 (fourteen) days.  Patient not taking: Reported on 09/24/2023   Provider, Historical, [name]  Active Self           Med Note MARISA, [name]   Tue Sep 18, 2023  8:38 PM) Last date received not recalled- last filled on 07/30/2023 for a 28-day's supply  escitalopram  (LEXAPRO ) 10 MG tablet [id number]  Take 1 tablet (10 mg total) by mouth daily. For depression  Patient taking differently: Take 10 mg by mouth at bedtime. For depression   [name] ORN, NP  Active Self  ezetimibe  (ZETIA ) 10 MG tablet [id number]  Take 1 tablet (10 mg total) by mouth daily. For high cholesterol  Patient taking differently: Take 10 mg by mouth at bedtime. For high cholesterol  [name] ORN, NP  Active Self  famotidine  (PEPCID ) 40 MG tablet [id number]  Take 1 tablet by mouth once daily  Patient taking differently: Take 40 mg by mouth at bedtime.   [name] ORN, NP  Active Self  folic acid  (FOLVITE ) 1 MG tablet [id number]  Take 1 tablet by mouth once daily  Patient taking differently: Take 1 mg by mouth at bedtime.   [name]  Active Self  gabapentin  (NEURONTIN ) 100 MG capsule [id number]  Take  200 mg by mouth at bedtime. Provider, Historical, [name]  Active Self  losartan  (COZAAR ) 25 MG tablet [id number]  Take 0.5 tablets (12.5 mg total) by mouth at bedtime.  Patient taking differently: Take 50 mg by mouth at bedtime.   Thukkani, Arun K, [name]  Active Self           Med Note MARISA, [name]   Tue Sep 18, 2023  8:33 PM) The patient stated her dose was recently INCREASED  meclizine  (ANTIVERT ) 12.5 MG tablet [id number]  Take 1-2 tablets (12.5-25 mg total) by mouth 3 (three) times daily as needed for dizziness. [name] ORN, NP  Active Self  Misc. Devices (BARIATRIC ROLLATOR) MISC [id number]  Please provide patient with insurance approved bariatric rollator walker. I89.0, R53.81, R26.81 [name] ORN, NP  Active   predniSONE  (DELTASONE ) 5 MG tablet [id number]  Take 5-10 mg by mouth at bedtime. Provider, Historical, [name]  Active Self            Home Care and Equipment/Supplies: Were Home Health Services Ordered?: Yes Name of Home Health Agency:: [facility]- they are resuming care Has Agency set up a time to come to your home?: Yes First Home Health Visit Date: 09/25/23 Any new equipment or medical supplies ordered?: No (She said she has wound care supplies and will order more as needed.  She also stated that she does the wound care when the nurse does not come out to see her.)  Functional Questionnaire: Do you need assistance with bathing/showering or dressing?: No Do you need assistance with meal preparation?: No Do you need assistance with eating?: No Do you have difficulty maintaining continence: No Do you need assistance with getting out of bed/getting out of a chair/moving?: Yes (ambulates with bariatric rollator) Do you have difficulty managing or taking your medications?: No  Follow up appointments reviewed: PCP Follow-up appointment confirmed?: No [name] Provider Line Number:[phone] Given: No (I told her that I will check with her PCP to see if she wants me to  schedule a hospital follow up appointment for her.) Specialist Hospital Follow-up appointment confirmed?: Yes Date of Specialist follow-up appointment?: 09/26/23 Follow-Up Specialty Provider:: wound care at [facility]. Do you need transportation to your follow-up appointment?: No (She said she arranges rides through her insurance company) Do you understand care options if your condition(s) worsen?: Yes-patient verbalized understanding    SIGNATURE Slater Diesel, RN

## 2023-09-24 NOTE — Telephone Encounter (Signed)
 From the [facility] call:  The patient wants PCP to know that she is still experiencing a lot of pain from the cellulitis and tylenol  is not touching the pain.  [name]- her appt with you is not until 12/17/2023, do you want to see her sooner?

## 2023-09-25 ENCOUNTER — Inpatient Hospital Stay (HOSPITAL_COMMUNITY)
Admission: EM | Admit: 2023-09-25 | Discharge: 2023-09-29 | DRG: 872 | Disposition: A | Discharge status: Home Health | Attending: Internal Medicine | Admitting: Internal Medicine

## 2023-09-25 ENCOUNTER — Encounter (HOSPITAL_COMMUNITY): Payer: Self-pay | Admitting: Emergency Medicine

## 2023-09-25 ENCOUNTER — Emergency Department (HOSPITAL_COMMUNITY)

## 2023-09-25 ENCOUNTER — Other Ambulatory Visit: Payer: Self-pay

## 2023-09-25 DIAGNOSIS — Z9109 Other allergy status, other than to drugs and biological substances: Secondary | ICD-10-CM

## 2023-09-25 DIAGNOSIS — G8929 Other chronic pain: Secondary | ICD-10-CM | POA: Diagnosis not present

## 2023-09-25 DIAGNOSIS — J45909 Unspecified asthma, uncomplicated: Secondary | ICD-10-CM | POA: Diagnosis present

## 2023-09-25 DIAGNOSIS — Z888 Allergy status to other drugs, medicaments and biological substances status: Secondary | ICD-10-CM

## 2023-09-25 DIAGNOSIS — A419 Sepsis, unspecified organism: Principal | ICD-10-CM | POA: Diagnosis present

## 2023-09-25 DIAGNOSIS — Z880 Allergy status to penicillin: Secondary | ICD-10-CM | POA: Diagnosis not present

## 2023-09-25 DIAGNOSIS — L03116 Cellulitis of left lower limb: Principal | ICD-10-CM | POA: Diagnosis present

## 2023-09-25 DIAGNOSIS — L8990 Pressure ulcer of unspecified site, unspecified stage: Secondary | ICD-10-CM | POA: Insufficient documentation

## 2023-09-25 DIAGNOSIS — Z8619 Personal history of other infectious and parasitic diseases: Secondary | ICD-10-CM

## 2023-09-25 DIAGNOSIS — E785 Hyperlipidemia, unspecified: Secondary | ICD-10-CM | POA: Diagnosis not present

## 2023-09-25 DIAGNOSIS — Z8249 Family history of ischemic heart disease and other diseases of the circulatory system: Secondary | ICD-10-CM | POA: Diagnosis not present

## 2023-09-25 DIAGNOSIS — Z7952 Long term (current) use of systemic steroids: Secondary | ICD-10-CM

## 2023-09-25 DIAGNOSIS — Z91040 Latex allergy status: Secondary | ICD-10-CM | POA: Diagnosis not present

## 2023-09-25 DIAGNOSIS — E7800 Pure hypercholesterolemia, unspecified: Secondary | ICD-10-CM | POA: Diagnosis not present

## 2023-09-25 DIAGNOSIS — M069 Rheumatoid arthritis, unspecified: Secondary | ICD-10-CM | POA: Diagnosis present

## 2023-09-25 DIAGNOSIS — I890 Lymphedema, not elsewhere classified: Secondary | ICD-10-CM | POA: Diagnosis not present

## 2023-09-25 DIAGNOSIS — M79662 Pain in left lower leg: Secondary | ICD-10-CM

## 2023-09-25 DIAGNOSIS — I10 Essential (primary) hypertension: Secondary | ICD-10-CM | POA: Diagnosis not present

## 2023-09-25 DIAGNOSIS — R7303 Prediabetes: Secondary | ICD-10-CM | POA: Diagnosis not present

## 2023-09-25 DIAGNOSIS — F419 Anxiety disorder, unspecified: Secondary | ICD-10-CM | POA: Diagnosis present

## 2023-09-25 DIAGNOSIS — D649 Anemia, unspecified: Secondary | ICD-10-CM | POA: Diagnosis present

## 2023-09-25 DIAGNOSIS — Z86711 Personal history of pulmonary embolism: Secondary | ICD-10-CM

## 2023-09-25 DIAGNOSIS — Z6841 Body Mass Index (BMI) 40.0 and over, adult: Secondary | ICD-10-CM | POA: Diagnosis not present

## 2023-09-25 DIAGNOSIS — D75839 Thrombocytosis, unspecified: Secondary | ICD-10-CM | POA: Diagnosis present

## 2023-09-25 DIAGNOSIS — M1990 Unspecified osteoarthritis, unspecified site: Secondary | ICD-10-CM | POA: Diagnosis not present

## 2023-09-25 DIAGNOSIS — Z79899 Other long term (current) drug therapy: Secondary | ICD-10-CM

## 2023-09-25 DIAGNOSIS — Z800 Family history of malignant neoplasm of digestive organs: Secondary | ICD-10-CM | POA: Diagnosis not present

## 2023-09-25 DIAGNOSIS — I872 Venous insufficiency (chronic) (peripheral): Secondary | ICD-10-CM | POA: Diagnosis not present

## 2023-09-25 DIAGNOSIS — Z7901 Long term (current) use of anticoagulants: Secondary | ICD-10-CM

## 2023-09-25 DIAGNOSIS — L97228 Non-pressure chronic ulcer of left calf with other specified severity: Secondary | ICD-10-CM | POA: Diagnosis not present

## 2023-09-25 DIAGNOSIS — M7989 Other specified soft tissue disorders: Secondary | ICD-10-CM | POA: Diagnosis present

## 2023-09-25 DIAGNOSIS — F32A Depression, unspecified: Secondary | ICD-10-CM | POA: Diagnosis present

## 2023-09-25 DIAGNOSIS — Z90710 Acquired absence of both cervix and uterus: Secondary | ICD-10-CM | POA: Diagnosis not present

## 2023-09-25 DIAGNOSIS — L0390 Cellulitis, unspecified: Secondary | ICD-10-CM

## 2023-09-25 LAB — COMPREHENSIVE METABOLIC PANEL WITH GFR
ALT: 13 U/L (ref 0–44)
AST: 20 U/L (ref 15–41)
Albumin: 2.7 g/dL — ABNORMAL LOW (ref 3.5–5.0)
Alkaline Phosphatase: 42 U/L (ref 38–126)
Anion gap: 9 (ref 5–15)
BUN: 10 mg/dL (ref 6–20)
CO2: 26 mmol/L (ref 22–32)
Calcium: 9.1 mg/dL (ref 8.9–10.3)
Chloride: 103 mmol/L (ref 98–111)
Creatinine, Ser: 1.07 mg/dL — ABNORMAL HIGH (ref 0.44–1.00)
GFR, Estimated: >60 mL/min (ref >60)
Glucose, Bld: 133 mg/dL — ABNORMAL HIGH (ref 70–99)
Potassium: 3.6 mmol/L (ref 3.5–5.1)
Sodium: 138 mmol/L (ref 135–145)
Total Bilirubin: 1.9 mg/dL — ABNORMAL HIGH (ref 0.0–1.2)
Total Protein: 8.2 g/dL — ABNORMAL HIGH (ref 6.5–8.1)

## 2023-09-25 LAB — CBC WITH DIFFERENTIAL/PLATELET
Abs Immature Granulocytes: 0.95 K/uL — ABNORMAL HIGH (ref 0.00–0.07)
Basophils Absolute: 0.1 K/uL (ref 0.0–0.1)
Basophils Relative: 1 %
Eosinophils Absolute: 0.3 K/uL (ref 0.0–0.5)
Eosinophils Relative: 2 %
HCT: 30.1 % — ABNORMAL LOW (ref 36.0–46.0)
Hemoglobin: 9.3 g/dL — ABNORMAL LOW (ref 12.0–15.0)
Immature Granulocytes: 6 %
Lymphocytes Relative: 9 %
Lymphs Abs: 1.4 K/uL (ref 0.7–4.0)
MCH: 28.1 pg (ref 26.0–34.0)
MCHC: 30.9 g/dL (ref 30.0–36.0)
MCV: 90.9 fL (ref 80.0–100.0)
Monocytes Absolute: 0.5 K/uL (ref 0.1–1.0)
Monocytes Relative: 3 %
Neutro Abs: 12.2 K/uL — ABNORMAL HIGH (ref 1.7–7.7)
Neutrophils Relative %: 79 %
Platelets: 430 K/uL — ABNORMAL HIGH (ref 150–400)
RBC: 3.31 MIL/uL — ABNORMAL LOW (ref 3.87–5.11)
RDW: 17.6 % — ABNORMAL HIGH (ref 11.5–15.5)
Smear Review: NORMAL
WBC: 15.4 K/uL — ABNORMAL HIGH (ref 4.0–10.5)
nRBC: 0 % (ref 0.0–0.2)

## 2023-09-25 LAB — LACTIC ACID, PLASMA
Lactic Acid, Venous: 1.9 mmol/L (ref 0.5–1.9)
Lactic Acid, Venous: 2 mmol/L (ref 0.5–1.9)

## 2023-09-25 MED ORDER — ONDANSETRON HCL 4 MG/2ML IJ SOLN
4.0000 mg | Freq: Four times a day (QID) | INTRAMUSCULAR | Status: DC | PRN
Start: 2023-09-25 — End: 2023-09-29

## 2023-09-25 MED ORDER — ALBUTEROL SULFATE (2.5 MG/3ML) 0.083% IN NEBU
2.5000 mg | INHALATION_SOLUTION | RESPIRATORY_TRACT | Status: DC | PRN
Start: 2023-09-25 — End: 2023-09-29

## 2023-09-25 MED ORDER — ACETAMINOPHEN 325 MG PO TABS
650.0000 mg | ORAL_TABLET | Freq: Four times a day (QID) | ORAL | Status: DC | PRN
Start: 2023-09-25 — End: 2023-09-29
  Administered 2023-09-26 – 2023-09-27 (×2): 650 mg via ORAL
  Filled 2023-09-25 (×2): qty 2

## 2023-09-25 MED ORDER — GABAPENTIN 100 MG PO CAPS
200.0000 mg | ORAL_CAPSULE | Freq: Every day | ORAL | Status: DC
  Administered 2023-09-25 – 2023-09-28 (×4): 200 mg via ORAL
  Filled 2023-09-25 (×4): qty 2

## 2023-09-25 MED ORDER — PREDNISONE 5 MG PO TABS
5.0000 mg | ORAL_TABLET | Freq: Every day | ORAL | Status: DC
Start: 2023-09-25 — End: 2023-09-29
  Administered 2023-09-25 – 2023-09-28 (×4): 5 mg via ORAL
  Filled 2023-09-25 (×4): qty 1

## 2023-09-25 MED ORDER — FAMOTIDINE 20 MG PO TABS
40.0000 mg | ORAL_TABLET | Freq: Every day | ORAL | Status: DC
  Administered 2023-09-25 – 2023-09-29 (×5): 40 mg via ORAL
  Filled 2023-09-25 (×5): qty 2

## 2023-09-25 MED ORDER — BUPROPION HCL ER (XL) 150 MG PO TB24
150.0000 mg | ORAL_TABLET | Freq: Every day | ORAL | Status: DC
  Administered 2023-09-25 – 2023-09-28 (×4): 150 mg via ORAL
  Filled 2023-09-25 (×4): qty 1

## 2023-09-25 MED ORDER — OXYCODONE HCL 5 MG PO TABS
5.0000 mg | ORAL_TABLET | ORAL | Status: DC | PRN
  Administered 2023-09-25 – 2023-09-26 (×3): 5 mg via ORAL
  Filled 2023-09-25 (×3): qty 1

## 2023-09-25 MED ORDER — PREDNISONE 5 MG PO TABS
5.0000 mg | ORAL_TABLET | Freq: Every evening | ORAL | Status: DC | PRN
  Filled 2023-09-25: qty 1

## 2023-09-25 MED ORDER — APIXABAN 5 MG PO TABS
5.0000 mg | ORAL_TABLET | Freq: Two times a day (BID) | ORAL | Status: DC
  Administered 2023-09-25 – 2023-09-29 (×8): 5 mg via ORAL
  Filled 2023-09-25 (×8): qty 1

## 2023-09-25 MED ORDER — ESCITALOPRAM OXALATE 10 MG PO TABS
10.0000 mg | ORAL_TABLET | Freq: Every day | ORAL | Status: DC
  Administered 2023-09-25 – 2023-09-28 (×4): 10 mg via ORAL
  Filled 2023-09-25 (×4): qty 1

## 2023-09-25 MED ORDER — ONDANSETRON HCL 4 MG PO TABS: 4.0000 mg | ORAL_TABLET | Freq: Four times a day (QID) | ORAL | Status: DC | PRN

## 2023-09-25 MED ORDER — ACETAMINOPHEN 650 MG RE SUPP: 650.0000 mg | Freq: Four times a day (QID) | RECTAL | Status: DC | PRN

## 2023-09-25 MED ORDER — MECLIZINE HCL 25 MG PO TABS: 12.5000 mg | ORAL_TABLET | Freq: Three times a day (TID) | ORAL | Status: DC | PRN

## 2023-09-25 MED ORDER — VANCOMYCIN HCL IN DEXTROSE 1-5 GM/200ML-% IV SOLN
1000.0000 mg | Freq: Once | INTRAVENOUS | Status: AC
  Administered 2023-09-25: 1000 mg via INTRAVENOUS
  Filled 2023-09-25: qty 200

## 2023-09-25 MED ORDER — EZETIMIBE 10 MG PO TABS
10.0000 mg | ORAL_TABLET | Freq: Every day | ORAL | Status: DC
  Administered 2023-09-25 – 2023-09-28 (×4): 10 mg via ORAL
  Filled 2023-09-25 (×4): qty 1

## 2023-09-25 MED ORDER — TRAZODONE HCL 50 MG PO TABS
25.0000 mg | ORAL_TABLET | Freq: Every evening | ORAL | Status: DC | PRN
  Administered 2023-09-25 – 2023-09-28 (×4): 25 mg via ORAL
  Filled 2023-09-25 (×4): qty 1

## 2023-09-25 NOTE — ED Triage Notes (Signed)
 Pt comes in for left leg swelling, increased redness and warmth noticed. Sent in by wound care nurse

## 2023-09-25 NOTE — ED Provider Notes (Signed)
 [place] EMERGENCY DEPARTMENT AT [facility] Provider Note   CSN: [id number] Arrival date & time: 09/25/23  1432     Patient presents with: Leg Swelling   [name] is a 58 y.o. female.  {Add pertinent medical, surgical, social history, OB history to YEP:67052} Patient has significant lymphedema.  She has been treated.  Cellulitis in the left lower leg but recently it has gotten much worse.   Leg Pain      Prior to Admission medications   Medication Sig Start Date End Date Taking? Authorizing Provider  acetaminophen  (TYLENOL ) 325 MG tablet Take 1 tablet (325 mg total) by mouth every 6 (six) hours as needed for mild pain (pain score 1-3), fever or headache (or Fever >/= 101). 09/20/23 10/20/23  Lue Elsie BROCKS, MD  albuterol  (VENTOLIN  HFA) 108 [phone] Base) MCG/ACT inhaler Inhale 2 puffs into the lungs every 6 (six) hours as needed for wheezing or shortness of breath. 09/14/23   Fleming, [name] W, NP  apixaban  (ELIQUIS ) 5 MG TABS tablet Take 1 tablet (5 mg total) by mouth 2 (two) times daily. Patient taking differently: Take 5 mg by mouth in the morning and at bedtime. 06/13/23   Fleming, [name] W, NP  buPROPion  (WELLBUTRIN  XL) 150 MG 24 hr tablet Take 1 tablet (150 mg total) by mouth daily. Patient taking differently: Take 150 mg by mouth at bedtime. 09/07/23   Fleming, [name] W, NP  cephALEXin  (KEFLEX ) 500 MG capsule Take 1 capsule (500 mg total) by mouth every 6 (six) hours for 7 days. 09/20/23 09/27/23  Lue Elsie BROCKS, MD  Cholecalciferol (VITAMIN D3) 50 MCG (2000 UT) TABS Take 2,000 Units by mouth at bedtime.    [provider]  CIMZIA, 2 SYRINGE, 200 MG/ML prefilled syringe Inject 200 mg into the skin every 14 (fourteen) days. Patient not taking: Reported on 09/24/2023    [provider]  escitalopram  (LEXAPRO ) 10 MG tablet Take 1 tablet (10 mg total) by mouth daily. For depression Patient taking differently: Take 10 mg by mouth at bedtime.  For depression 06/13/23   Fleming, [name] W, NP  ezetimibe  (ZETIA ) 10 MG tablet Take 1 tablet (10 mg total) by mouth daily. For high cholesterol Patient taking differently: Take 10 mg by mouth at bedtime. For high cholesterol 06/13/23   Fleming, [name] W, NP  famotidine  (PEPCID ) 40 MG tablet Take 1 tablet by mouth once daily Patient taking differently: Take 40 mg by mouth at bedtime. 08/21/23   Fleming, [name] W, NP  folic acid  (FOLVITE ) 1 MG tablet Take 1 tablet by mouth once daily Patient taking differently: Take 1 mg by mouth at bedtime. 07/24/23   Newlin, Enobong, MD  gabapentin  (NEURONTIN ) 100 MG capsule Take 200 mg by mouth at bedtime. 05/31/23 05/30/24  [provider]  losartan  (COZAAR ) 25 MG tablet Take 0.5 tablets (12.5 mg total) by mouth at bedtime. Patient taking differently: Take 50 mg by mouth at bedtime. 12/01/22   Thukkani, Arun K, MD  meclizine  (ANTIVERT ) 12.5 MG tablet Take 1-2 tablets (12.5-25 mg total) by mouth 3 (three) times daily as needed for dizziness. 10/11/22   Theotis Haze ORN, NP  Misc. Devices (BARIATRIC ROLLATOR) MISC Please provide patient with insurance approved bariatric rollator walker. I89.0, R53.81, R26.81 11/05/19   Theotis Haze ORN, NP  predniSONE  (DELTASONE ) 5 MG tablet Take 5-10 mg by mouth at bedtime. 07/19/23   [provider]    Allergies: Abatacept, Bee venom, Cefadroxil , Augmentin  [amoxicillin -pot clavulanate], Latex, Nulytely  [  peg 3350 -kcl-na bicarb-nacl], and Tomato    Review of Systems  Updated Vital Signs BP 132/63 (BP Location: Right Arm)   Pulse 93   Temp 98.8 F (37.1 C) (Oral)   Resp 18   Ht 6' (1.829 m)   Wt (!) 171 kg   LMP 01/31/2011   SpO2 97%   BMI 51.13 kg/m   Physical Exam  (all labs ordered are listed, but only abnormal results are displayed) Labs Reviewed  COMPREHENSIVE METABOLIC PANEL WITH GFR - Abnormal; Notable for the following components:      Result Value   Glucose, Bld 133 (*)    Creatinine, Ser 1.07 (*)     Total Protein 8.2 (*)    Albumin 2.7 (*)    Total Bilirubin 1.9 (*)    All other components within normal limits  CBC WITH DIFFERENTIAL/PLATELET - Abnormal; Notable for the following components:   WBC 15.4 (*)    RBC 3.31 (*)    Hemoglobin 9.3 (*)    HCT 30.1 (*)    RDW 17.6 (*)    Platelets 430 (*)    Neutro Abs 12.2 (*)    Abs Immature Granulocytes 0.95 (*)    All other components within normal limits  CULTURE, BLOOD (SINGLE)  LACTIC ACID, PLASMA  LACTIC ACID, PLASMA  BASIC METABOLIC PANEL WITH GFR  CBC    EKG: None  Radiology: VAS US  LOWER EXTREMITY VENOUS (DVT) (7a-7p) Result Date: 09/25/2023  Lower Venous DVT Study Patient Name:  [name]  Date of Exam:   09/25/2023 Medical Rec #: [id number]          Accession #:    [id number] Date of Birth: 10/20/1965         Patient Gender: F Patient Age:   68 years Exam Location:  [facility] Procedure:      VAS US  LOWER EXTREMITY VENOUS (DVT) Referring Phys: FONDA GEIPLE --------------------------------------------------------------------------------  Indications: Pain.  Limitations: Body habitus and poor ultrasound/tissue interface. Performing Technologist: Elmarie Lindau, RVT  Examination Guidelines: A complete evaluation includes B-mode imaging, spectral Doppler, color Doppler, and power Doppler as needed of all accessible portions of each vessel. Bilateral testing is considered an integral part of a complete examination. Limited examinations for reoccurring indications may be performed as noted. The reflux portion of the exam is performed with the patient in reverse Trendelenburg.  +---------+---------------+---------+-----------+----------+-------------------+ LEFT     CompressibilityPhasicitySpontaneityPropertiesThrombus Aging      +---------+---------------+---------+-----------+----------+-------------------+ CFV      Full           Yes      Yes                                       +---------+---------------+---------+-----------+----------+-------------------+ SFJ      Full                                                             +---------+---------------+---------+-----------+----------+-------------------+ FV Prox  Full                                                             +---------+---------------+---------+-----------+----------+-------------------+  FV Mid   Full                                                             +---------+---------------+---------+-----------+----------+-------------------+ FV Distal                                             Not well visualized +---------+---------------+---------+-----------+----------+-------------------+ PFV      Full                                                             +---------+---------------+---------+-----------+----------+-------------------+ POP                                                   Not well visualized +---------+---------------+---------+-----------+----------+-------------------+ PTV                                                   Not well visualized +---------+---------------+---------+-----------+----------+-------------------+ PERO                                                  Not well visualized +---------+---------------+---------+-----------+----------+-------------------+     Summary: LEFT: - There is no evidence of deep vein thrombosis in the lower extremity. However, portions of this examination were limited- see technologist comments above.  *See table(s) above for measurements and observations.    Preliminary     {Document cardiac monitor, telemetry assessment procedure when appropriate:32947} Procedures   Medications Ordered in the ED  vancomycin  (VANCOCIN ) IVPB 1000 mg/200 mL premix (1,000 mg Intravenous Incomplete 09/25/23 1852)  ezetimibe  (ZETIA ) tablet 10 mg (has no administration in time range)  buPROPion   (WELLBUTRIN  XL) 24 hr tablet 150 mg (has no administration in time range)  escitalopram  (LEXAPRO ) tablet 10 mg (has no administration in time range)  predniSONE  (DELTASONE ) tablet 5-10 mg (has no administration in time range)  famotidine  (PEPCID ) tablet 40 mg (has no administration in time range)  meclizine  (ANTIVERT ) tablet 12.5-25 mg (has no administration in time range)  apixaban  (ELIQUIS ) tablet 5 mg (has no administration in time range)  gabapentin  (NEURONTIN ) capsule 200 mg (has no administration in time range)  acetaminophen  (TYLENOL ) tablet 650 mg (has no administration in time range)    Or  acetaminophen  (TYLENOL ) suppository 650 mg (has no administration in time range)  traZODone  (DESYREL ) tablet 25 mg (has no administration in time range)  ondansetron  (ZOFRAN ) tablet 4 mg (has no administration in time range)    Or  ondansetron  (ZOFRAN ) injection 4 mg (has no administration in time range)  albuterol  (PROVENTIL ) (2.5 MG/3ML) 0.083% nebulizer solution  2.5 mg (has no administration in time range)  oxyCODONE  (Oxy IR/ROXICODONE ) immediate release tablet 5 mg (has no administration in time range)      {Click here for ABCD2, HEART and other calculators REFRESH Note before signing:1}                              Medical Decision Making Amount and/or Complexity of Data Reviewed Labs: ordered.  Risk Prescription drug management. Decision regarding hospitalization.   Patient with worsening cellulitis to left lower leg.  She will be admitted for IV antibiotics  {Document critical care time when appropriate  Document review of labs and clinical decision tools ie CHADS2VASC2, etc  Document your independent review of radiology images and any outside records  Document your discussion with family members, caretakers and with consultants  Document social determinants of health affecting pt's care  Document your decision making why or why not admission, treatments were needed:32947:::1}    Final diagnoses:  Cellulitis of left lower leg    ED Discharge Orders     None

## 2023-09-25 NOTE — Progress Notes (Signed)
 NP [place] was notified of critical lab: lactic acid 2.0

## 2023-09-25 NOTE — ED Provider Triage Note (Signed)
 Emergency Medicine Provider Triage Evaluation Note  [name] , a 58 y.o. female  was evaluated in triage.  Pt complains of increasing left lower extremity redness and swelling.  Patient has a history of lymphedema.  She is followed by wound care.  Patient's failed member showed me a picture of a new blister that is formed.  Sent by home health nurse today due to worsening condition.  Patient does report subjective chills.  Review of Systems  Positive: Leg swelling, redness Negative:   Physical Exam  BP (!) 145/85   Pulse 100   Temp 98.8 F (37.1 C) (Oral)   Resp 16   Ht 6' (1.829 m)   Wt (!) 171 kg   LMP 01/31/2011   SpO2 99%   BMI 51.13 kg/m  Gen:   Awake, no distress   Resp:  Normal effort  MSK:   Moves extremities without difficulty  Other:  Left lower extremity is mostly wrapped, there is erythema proximal to the dressing and a bullae noted on a picture shown by the patient's family member.  Concern for infected wound with cellulitis.  Will need to rule out DVT.  Medical Decision Making  Medically screening exam initiated at 3:13 PM.  Appropriate orders placed.  [name] was informed that the remainder of the evaluation will be completed by another provider, this initial triage assessment does not replace that evaluation, and the importance of remaining in the ED until their evaluation is complete.  Labs are ordered, DVT study ordered.   Lauren Chew, PA-C 09/25/23 1515

## 2023-09-25 NOTE — H&P (Signed)
 History and Physical  [name] FMW:[id number] DOB: 1965/11/02 DOA: 09/25/2023  PCP: Theotis Haze ORN, NP   Chief Complaint: Left leg infection  HPI: [name] is a 58 y.o. female with medical history significant for RA, morbid obesity, bilateral lower extremity lymphedema recently discharged from [facility] for associated cellulitis on 7/24 now being admitted to the hospital with sepsis due to recurrent cellulitis.  She is followed as an outpatient by wound care, was recently hospitalized with concerns for cellulitis until 7/24.  She was discharged home on p.o. Keflex .  Patient and daughter say that she was doing well overall when she was discharged from the hospital, however in the last 3 days she has had progressive development of a blistering lesion on the left lateral upper leg, with associated pain, and erythema.  She has had some subjective chills, but denies fever.  She denies any cough, shortness of breath, nausea, diarrhea or other new symptoms.  Patient and daughter are concerned that she has tracking erythema of the left side of her leg, which has been worsening.  In the meantime, she has continued with p.o. Keflex  which was prescribed during her last hospital admission.  Review of Systems: Please see HPI for pertinent positives and negatives. A complete 10 system review of systems are otherwise negative.  Past Medical History:  Diagnosis Date   Anemia    Anxiety    Arthritis    Asthma    hx as child - no prob as adult - no inhaler   Blood transfusion 01/22/11   transfusion 2 units at [facility]   Depression 08/2010   psych assessment   Dyspnea    Fibroid    Headache(784.0)    rx for imitrex - last one jan   Keloid    Lymphedema    Pulmonary embolism [facility])    Past Surgical History:  Procedure Laterality Date   ABDOMINAL HYSTERECTOMY     COLONOSCOPY N/A 11/08/2017   Procedure: COLONOSCOPY;  Surgeon: Harvey Margo CROME, MD;  Location: [facility];  Service: Endoscopy;   Laterality: N/A;  2:00pm   FLEXIBLE SIGMOIDOSCOPY N/A 09/17/2017   Procedure: FLEXIBLE SIGMOIDOSCOPY;  Surgeon: Harvey Margo CROME, MD;  Location: [facility];  Service: Endoscopy;  Laterality: N/A;   HERNIA REPAIR  2009   umbilical   POLYPECTOMY  11/08/2017   Procedure: POLYPECTOMY;  Surgeon: Harvey Margo CROME, MD;  Location: [facility];  Service: Endoscopy;;  ascending colon (CSx1), transverse colon (CS x1), splenic flexure (HSx1)   SVD     Spontaneous vaginal delivery; x 1   Social History:  reports that she has never smoked. She has never used smokeless tobacco. She reports that she does not drink alcohol and does not use drugs.  Allergies  Allergen Reactions   Abatacept Anaphylaxis and Other (See Comments)    Orencia: Anaphylaxis-Streptomycin Clickject    Bee Venom Anaphylaxis   Cefadroxil  Diarrhea   Augmentin  [Amoxicillin -Pot Clavulanate] Hives, Itching and Other (See Comments)    Did PCN reaction causing immediate rash, facial/tongue/throat swelling, SOB or lightheadedness with hypotension: yes Did PCN reaction causing severe rash involving mucus membranes or skin necrosis: no Has patient had a PCN reaction that required hospitalization: in hospital Has patient had a PCN reaction occurring within the last 10 years: no If all of the above answers are NO, then may proceed with Cephalosporin use.  Pt reports no recollection of any reactions when taking penicillin in past   Latex Hives and Other (  See Comments)    Local reaction (welts). Patient denies any wheezing or other reaction with latex exposure.   Nulytely  [Peg 3350 -Kcl-Na Bicarb-Nacl] Nausea And Vomiting and Other (See Comments)    NAUSEA AND VOMITING. MAY TOLERATE LOW VOLUME PREP.   Tomato Other (See Comments)    Caused sores in the mouth    Family History  Problem Relation Age of Onset   Heart disease Mother    Hypertension Mother    Hypertension Father    Colon cancer Father 67       Passed away 21 yrs old    Hypertension Sister    Cancer Maternal Grandmother        gastric cancer   Cancer Maternal Grandfather        pancreatic cancer   Colon cancer Paternal Grandmother    Colon cancer Paternal Uncle    Colon polyps Neg Hx      Prior to Admission medications   Medication Sig Start Date End Date Taking? Authorizing Provider  acetaminophen  (TYLENOL ) 325 MG tablet Take 1 tablet (325 mg total) by mouth every 6 (six) hours as needed for mild pain (pain score 1-3), fever or headache (or Fever >/= 101). 09/20/23 10/20/23  Lue Elsie BROCKS, MD  albuterol  (VENTOLIN  HFA) 108 [phone] Base) MCG/ACT inhaler Inhale 2 puffs into the lungs every 6 (six) hours as needed for wheezing or shortness of breath. 09/14/23   Fleming, [name] W, NP  apixaban  (ELIQUIS ) 5 MG TABS tablet Take 1 tablet (5 mg total) by mouth 2 (two) times daily. Patient taking differently: Take 5 mg by mouth in the morning and at bedtime. 06/13/23   Fleming, [name] W, NP  buPROPion  (WELLBUTRIN  XL) 150 MG 24 hr tablet Take 1 tablet (150 mg total) by mouth daily. Patient taking differently: Take 150 mg by mouth at bedtime. 09/07/23   Fleming, [name] W, NP  cephALEXin  (KEFLEX ) 500 MG capsule Take 1 capsule (500 mg total) by mouth every 6 (six) hours for 7 days. 09/20/23 09/27/23  Lue Elsie BROCKS, MD  Cholecalciferol (VITAMIN D3) 50 MCG (2000 UT) TABS Take 2,000 Units by mouth at bedtime.    [provider]  CIMZIA, 2 SYRINGE, 200 MG/ML prefilled syringe Inject 200 mg into the skin every 14 (fourteen) days. Patient not taking: Reported on 09/24/2023    [provider]  escitalopram  (LEXAPRO ) 10 MG tablet Take 1 tablet (10 mg total) by mouth daily. For depression Patient taking differently: Take 10 mg by mouth at bedtime. For depression 06/13/23   Fleming, [name] W, NP  ezetimibe  (ZETIA ) 10 MG tablet Take 1 tablet (10 mg total) by mouth daily. For high cholesterol Patient taking differently: Take 10 mg by mouth at bedtime. For high  cholesterol 06/13/23   Fleming, [name] W, NP  famotidine  (PEPCID ) 40 MG tablet Take 1 tablet by mouth once daily Patient taking differently: Take 40 mg by mouth at bedtime. 08/21/23   Fleming, [name] W, NP  folic acid  (FOLVITE ) 1 MG tablet Take 1 tablet by mouth once daily Patient taking differently: Take 1 mg by mouth at bedtime. 07/24/23   Newlin, Enobong, MD  gabapentin  (NEURONTIN ) 100 MG capsule Take 200 mg by mouth at bedtime. 05/31/23 05/30/24  [provider]  losartan  (COZAAR ) 25 MG tablet Take 0.5 tablets (12.5 mg total) by mouth at bedtime. Patient taking differently: Take 50 mg by mouth at bedtime. 12/01/22   Thukkani, Arun K, MD  meclizine  (ANTIVERT ) 12.5 MG tablet Take 1-2 tablets (12.5-25  mg total) by mouth 3 (three) times daily as needed for dizziness. 10/11/22   Theotis Haze ORN, NP  Misc. Devices (BARIATRIC ROLLATOR) MISC Please provide patient with insurance approved bariatric rollator walker. I89.0, R53.81, R26.81 11/05/19   Theotis Haze ORN, NP  predniSONE  (DELTASONE ) 5 MG tablet Take 5-10 mg by mouth at bedtime. 07/19/23   [provider]    Physical Exam: BP 132/63 (BP Location: Right Arm)   Pulse 93   Temp 98.8 F (37.1 C) (Oral)   Resp 18   Ht 6' (1.829 m)   Wt (!) 171 kg   LMP 01/31/2011   SpO2 97%   BMI 51.13 kg/m  General:  Alert, oriented, calm, in no acute distress, looks nontoxic, her daughter is at the bedside Cardiovascular: RRR, no murmurs or rubs, no peripheral edema  Respiratory: clear to auscultation bilaterally, no wheezes, no crackles  Abdomen: soft, nontender, nondistended, normal bowel tones heard  Skin: dry, erythema, warmth, tenderness and blistering lesion on the left lateral leg.  Please see pictures below Musculoskeletal: no joint effusions, normal range of motion  Psychiatric: appropriate affect, normal speech  Neurologic: extraocular muscles intact, clear speech, moving all extremities with intact sensorium              Labs  on Admission:  Basic Metabolic Panel: Recent Labs  Lab 09/19/23 0434 09/20/23 0432 09/25/23 1510  NA 134* 133* 138  K 3.7 3.4* 3.6  CL 102 102 103  CO2 24 26 26   GLUCOSE 146* 103* 133*  BUN 19 11 10   CREATININE 1.20* 1.02* 1.07*  CALCIUM  8.4* 8.5* 9.1   Liver Function Tests: Recent Labs  Lab 09/25/23 1510  AST 20  ALT 13  ALKPHOS 42  BILITOT 1.9*  PROT 8.2*  ALBUMIN 2.7*   No results for input(s): LIPASE, AMYLASE in the last 168 hours. No results for input(s): AMMONIA in the last 168 hours. CBC: Recent Labs  Lab 09/19/23 0434 09/20/23 0432 09/25/23 1510  WBC 13.0* 18.1* 15.4*  NEUTROABS  --   --  12.2*  HGB 8.7* 8.9* 9.3*  HCT 28.1* 28.3* 30.1*  MCV 92.4 91.3 90.9  PLT 206 235 430*   Cardiac Enzymes: No results for input(s): CKTOTAL, CKMB, CKMBINDEX, TROPONINI in the last 168 hours. BNP (last 3 results) No results for input(s): BNP in the last 8760 hours.  ProBNP (last 3 results) No results for input(s): PROBNP in the last 8760 hours.  CBG: No results for input(s): GLUCAP in the last 168 hours.  Radiological Exams on Admission: VAS US  LOWER EXTREMITY VENOUS (DVT) (7a-7p) Result Date: 09/25/2023  Lower Venous DVT Study Patient Name:  CHAMPAGNE PALETTA  Date of Exam:   09/25/2023 Medical Rec #: [id number]          Accession #:    [id number] Date of Birth: March 22, 1965         Patient Gender: F Patient Age:   7 years Exam Location:  [facility] Procedure:      VAS US  LOWER EXTREMITY VENOUS (DVT) Referring Phys: FONDA GEIPLE --------------------------------------------------------------------------------  Indications: Pain.  Limitations: Body habitus and poor ultrasound/tissue interface. Performing Technologist: Elmarie Lindau, RVT  Examination Guidelines: A complete evaluation includes B-mode imaging, spectral Doppler, color Doppler, and power Doppler as needed of all accessible portions of each vessel. Bilateral testing is considered an  integral part of a complete examination. Limited examinations for reoccurring indications may be performed as noted. The reflux portion of the exam is performed  with the patient in reverse Trendelenburg.  +---------+---------------+---------+-----------+----------+-------------------+ LEFT     CompressibilityPhasicitySpontaneityPropertiesThrombus Aging      +---------+---------------+---------+-----------+----------+-------------------+ CFV      Full           Yes      Yes                                      +---------+---------------+---------+-----------+----------+-------------------+ SFJ      Full                                                             +---------+---------------+---------+-----------+----------+-------------------+ FV Prox  Full                                                             +---------+---------------+---------+-----------+----------+-------------------+ FV Mid   Full                                                             +---------+---------------+---------+-----------+----------+-------------------+ FV Distal                                             Not well visualized +---------+---------------+---------+-----------+----------+-------------------+ PFV      Full                                                             +---------+---------------+---------+-----------+----------+-------------------+ POP                                                   Not well visualized +---------+---------------+---------+-----------+----------+-------------------+ PTV                                                   Not well visualized +---------+---------------+---------+-----------+----------+-------------------+ PERO                                                  Not well visualized +---------+---------------+---------+-----------+----------+-------------------+     Summary: LEFT: - There is no evidence  of deep vein thrombosis in the lower extremity. However, portions of this examination were limited- see technologist comments above.  *See table(s) above for  measurements and observations.    Preliminary    Assessment/Plan [name] is a 58 y.o. female with medical history significant for RA, morbid obesity, bilateral lower extremity lymphedema recently discharged from [facility] for associated cellulitis on 7/24 now being admitted to the hospital with sepsis due to recurrent cellulitis.   Sepsis-meeting criteria with tachycardia, leukocytosis, source is left lower extremity cellulitis.  No evidence of endorgan dysfunction. -Inpatient admission -Empiric IV vancomycin  -Follow-up blood cultures -Given recurrent cellulitis, may want to consider inpatient ID consultation in the morning  History of C. difficile colitis-monitor closely for diarrhea, and try to minimize antibiotics as possible  Chronic anemia-appears to be stable  Thrombocytosis-possibly reactive to acute infection  Rheumatoid arthritis-prednisone  5 mg p.o. daily  Hyperlipidemia-Zetia   Depression-continue Lexapro  and Wellbutrin   Chronic pain-continue home gabapentin   History of PE-continue home apixaban   Morbid obesity-BMI 51.1, complicating all aspects of care    Code Status: Full Code  Consults called: None  Admission status: The appropriate patient status for this patient is INPATIENT. Inpatient status is judged to be reasonable and necessary in order to provide the required intensity of service to ensure the patient's safety. The patient's presenting symptoms, physical exam findings, and initial radiographic and laboratory data in the context of their chronic comorbidities is felt to place them at high risk for further clinical deterioration. Furthermore, it is not anticipated that the patient will be medically stable for discharge from the hospital within 2 midnights of admission.    I certify that at the point of  admission it is my clinical judgment that the patient will require inpatient hospital care spanning beyond 2 midnights from the point of admission due to high intensity of service, high risk for further deterioration and high frequency of surveillance required  Time spent: 56 minutes  Tawona Filsinger CHRISTELLA Gail MD [facility] Pager [phone]  If 7PM-7AM, please contact night-coverage [email or web address] Password [email or web address]  09/25/2023, 6:50 PM

## 2023-09-26 DIAGNOSIS — L8990 Pressure ulcer of unspecified site, unspecified stage: Secondary | ICD-10-CM | POA: Insufficient documentation

## 2023-09-26 DIAGNOSIS — L0390 Cellulitis, unspecified: Secondary | ICD-10-CM | POA: Diagnosis not present

## 2023-09-26 DIAGNOSIS — A419 Sepsis, unspecified organism: Secondary | ICD-10-CM | POA: Diagnosis not present

## 2023-09-26 LAB — CBC
HCT: 25.1 % — ABNORMAL LOW (ref 36.0–46.0)
Hemoglobin: 7.4 g/dL — ABNORMAL LOW (ref 12.0–15.0)
MCH: 27 pg (ref 26.0–34.0)
MCHC: 29.5 g/dL — ABNORMAL LOW (ref 30.0–36.0)
MCV: 91.6 fL (ref 80.0–100.0)
Platelets: 378 K/uL (ref 150–400)
RBC: 2.74 MIL/uL — ABNORMAL LOW (ref 3.87–5.11)
RDW: 17.8 % — ABNORMAL HIGH (ref 11.5–15.5)
WBC: 13.2 K/uL — ABNORMAL HIGH (ref 4.0–10.5)
nRBC: 0 % (ref 0.0–0.2)

## 2023-09-26 LAB — BASIC METABOLIC PANEL WITH GFR
Anion gap: 5 (ref 5–15)
BUN: 8 mg/dL (ref 6–20)
CO2: 25 mmol/L (ref 22–32)
Calcium: 8.1 mg/dL — ABNORMAL LOW (ref 8.9–10.3)
Chloride: 105 mmol/L (ref 98–111)
Creatinine, Ser: 0.94 mg/dL (ref 0.44–1.00)
GFR, Estimated: >60 mL/min (ref >60)
Glucose, Bld: 134 mg/dL — ABNORMAL HIGH (ref 70–99)
Potassium: 3.8 mmol/L (ref 3.5–5.1)
Sodium: 135 mmol/L (ref 135–145)

## 2023-09-26 MED ORDER — MORPHINE SULFATE (PF) 2 MG/ML IV SOLN: 2.0000 mg | INTRAVENOUS | Status: DC | PRN

## 2023-09-26 MED ORDER — VANCOMYCIN HCL 1250 MG/250ML IV SOLN
1250.0000 mg | Freq: Two times a day (BID) | INTRAVENOUS | Status: DC
  Administered 2023-09-26 – 2023-09-27 (×3): 1250 mg via INTRAVENOUS
  Filled 2023-09-26 (×4): qty 250

## 2023-09-26 MED ORDER — OXYCODONE HCL 5 MG PO TABS
5.0000 mg | ORAL_TABLET | ORAL | Status: DC | PRN
  Administered 2023-09-26 – 2023-09-28 (×8): 5 mg via ORAL
  Filled 2023-09-26 (×9): qty 1

## 2023-09-26 NOTE — Plan of Care (Signed)
   Problem: Education: Goal: Knowledge of General Education information will improve Description: Including pain rating scale, medication(s)/side effects and non-pharmacologic comfort measures Outcome: Progressing   Problem: Clinical Measurements: Goal: Ability to maintain clinical measurements within normal limits will improve Outcome: Progressing

## 2023-09-26 NOTE — TOC Initial Note (Signed)
 Transition of Care [facility]) - Initial/Assessment Note    Patient Details  Name: [name] MRN: [id number] Date of Birth: 12-27-1965  Transition of [facility] CM/SW Contact:    NORMAN ASPEN, LCSW Phone Number: 09/26/2023, 3:12 PM  Clinical Narrative:                   Met with pt today to review potential dc needs.  Pt very pleasant.  Confirms that she lives alone.  Mother is primary support and lives ~ 5 mins away.  Pt is active with [facility] for [facility] coverage and would like to continue this at dc.  Agreeable for CSW/ TOC to follow along for any potential new needs.  Expected Discharge Plan: Home w Home Health Services Barriers to Discharge: Continued Medical Work up   Patient Goals and CMS Choice Patient states their goals for this hospitalization and ongoing recovery are:: return home          Expected Discharge Plan and Services In-house Referral: Clinical Social Work   Post Acute Care Choice: Home Health Living arrangements for the past 2 months: Apartment                 DME Arranged: N/A DME Agency: NA                  Prior Living Arrangements/Services Living arrangements for the past 2 months: Apartment Lives with:: Self Patient language and need for interpreter reviewed:: Yes Do you feel safe going back to the place where you live?: Yes      Need for Family Participation in Patient Care: Yes (Comment) Care giver support system in place?: Yes (comment) Current home services: DME, Home RN Criminal Activity/Legal Involvement Pertinent to Current Situation/Hospitalization: No - Comment as needed  Activities of Daily Living   ADL Screening (condition at time of admission) Independently performs ADLs?: Yes (appropriate for developmental age) Is the patient deaf or have difficulty hearing?: No Does the patient have difficulty seeing, even when wearing glasses/contacts?: No Does the patient have difficulty concentrating, remembering, or making  decisions?: No  Permission Sought/Granted Permission sought to share information with : Family Supports Permission granted to share information with : Yes, Verbal Permission Granted  Share Information with NAME: mother, Sophia Sperry @ [phone]           Emotional Assessment Appearance:: Appears stated age Attitude/Demeanor/Rapport: Gracious, Engaged Affect (typically observed): Accepting, Appropriate Orientation: : Oriented to Self, Oriented to Place, Oriented to  Time, Oriented to Situation Alcohol / Substance Use: Not Applicable Psych Involvement: No (comment)  Admission diagnosis:  Cellulitis of left lower leg [L03.116] Sepsis due to cellulitis (HCC) [L03.90, A41.9] Patient Active Problem List   Diagnosis Date Noted   Pressure injury of skin 09/26/2023   Cellulitis 12/06/2022   AKI (acute kidney injury) (HCC) 11/07/2022   Murmur, cardiac 11/07/2022   Essential hypertension 11/07/2022   Elevated bilirubin 11/07/2022   Rheumatoid arthritis (HCC) 03/06/2022   Lymphedema 03/06/2022   Morbid obesity (HCC) 09/14/2021   Chest pain 04/07/2021   Screening for malignant neoplasm of colon 03/09/2021   Constipation 03/09/2021   Iron  deficiency anemia 03/09/2021   Body mass index (BMI) 45.0-49.9, adult (HCC) 08/20/2020   Chronic fatigue syndrome 08/20/2020   Polyarthralgia 08/20/2020   Vitamin D  deficiency 08/20/2020   Sepsis due to cellulitis (HCC) 12/31/2018   Elephantiasis 12/31/2018   Ischemic chest pain (HCC) 12/31/2018   History of pulmonary embolus (PE) 12/31/2018   Physical  deconditioning 08/27/2018   Adenopathy 09/15/2017   Family history of colon cancer 08/01/2017   Taking multiple medications for chronic disease 08/01/2017   Axillary lymphadenopathy 01/26/2017   Urinary incontinence 01/25/2017   Hypokalemia 01/25/2017   Lobar pneumonia (HCC) 01/25/2017   Pressure ulcer of sacral region, stage 2 (HCC) 01/25/2017   Abnormal ECG 01/29/2013   Dyspnea     Menorrhagia with regular cycle 02/22/2011   Anemia 02/22/2011   PCP:  Theotis Haze ORN, NP Pharmacy:   [facility] [address], [place] - [address] [place] [address] Phone: [phone] Fax: [phone]  [facility] - [facility] [address] [place] [address] Phone: [phone] Fax: [phone]     Social Drivers of Health (SDOH) Social History: SDOH Screenings   Food Insecurity: No Food Insecurity (09/25/2023)  Housing: High Risk (09/25/2023)  Transportation Needs: No Transportation Needs (09/25/2023)  Utilities: At Risk (09/25/2023)  Alcohol Screen: Low Risk  (10/30/2020)  Depression (PHQ2-9): High Risk (09/14/2023)  Financial Resource Strain: Low Risk  (05/14/2023)  Physical Activity: Insufficiently Active (12/06/2022)  Social Connections: Moderately Integrated (12/06/2022)  Stress: No Stress Concern Present (12/06/2022)  Tobacco Use: Low Risk  (09/25/2023)  Health Literacy: Adequate Health Literacy (12/06/2022)   SDOH Interventions: Housing Interventions: Intervention Not Indicated, Inpatient TOC Utilities Interventions: Intervention Not Indicated, Inpatient TOC   Readmission Risk Interventions     No data to display

## 2023-09-26 NOTE — Plan of Care (Signed)

## 2023-09-26 NOTE — Progress Notes (Signed)
 Pharmacy Antibiotic Note  [name] is a 58 y.o. female admitted on 09/25/2023 with cellulitis.  Pharmacy has been consulted for Vancomycin  dosing.  Plan: Vancomycin  1250mg  IV q12h to target AUC 400-550.   Estimated AUC on this regimen = 477 Monitor renal function and cx data  Check levels as needed   Height: 6' (182.9 cm) Weight: (!) 171 kg (376 lb 15.8 oz) IBW/kg (Calculated) : 73.1  Temp (24hrs), Avg:98.7 F (37.1 C), Min:98.6 F (37 C), Max:98.8 F (37.1 C)  Recent Labs  Lab 09/19/23 0434 09/20/23 0432 09/25/23 1503 09/25/23 1510 09/25/23 2045  WBC 13.0* 18.1*  --  15.4*  --   CREATININE 1.20* 1.02*  --  1.07*  --   LATICACIDVEN  --   --  1.9  --  2.0*    Estimated Creatinine Clearance: 105.3 mL/min (A) (by C-G formula based on SCr of 1.07 mg/dL (H)).    Allergies  Allergen Reactions   Abatacept Anaphylaxis and Other (See Comments)    Orencia: Anaphylaxis-Streptomycin Clickject    Bee Venom Anaphylaxis   Cefadroxil  Diarrhea   Augmentin  [Amoxicillin -Pot Clavulanate] Hives, Itching and Other (See Comments)    Did PCN reaction causing immediate rash, facial/tongue/throat swelling, SOB or lightheadedness with hypotension: yes Did PCN reaction causing severe rash involving mucus membranes or skin necrosis: no Has patient had a PCN reaction that required hospitalization: in hospital Has patient had a PCN reaction occurring within the last 10 years: no If all of the above answers are NO, then may proceed with Cephalosporin use.  Pt reports no recollection of any reactions when taking penicillin in past   Latex Hives and Other (See Comments)    Local reaction (welts). Patient denies any wheezing or other reaction with latex exposure.   Keflex  [Cephalexin ] Diarrhea, Nausea Only and Other (See Comments)    Causes extreme sleepiness, nausea, and diarrhea   Nulytely  [Peg 3350 -Kcl-Na Bicarb-Nacl] Nausea And Vomiting and Other (See Comments)    NAUSEA AND VOMITING.  MAY TOLERATE LOW VOLUME PREP.   Tomato Other (See Comments)    Caused sores in the mouth    Antimicrobials this admission: 7/29 Vancomycin  >>   Dose adjustments this admission:  Microbiology results: 7/29 BCx:   Thank you for allowing pharmacy to be a part of this patient's care.  Rosaline Millet PharmD 09/26/2023 1:27 AM

## 2023-09-26 NOTE — Progress Notes (Signed)
 Progress Note    [name]   FMW:[id number]  DOB: [birth year]  DOA: 09/25/2023     1 PCP: Theotis Haze ORN, NP  Initial CC: LLE redness and pain  Hospital Course: [name] is a 58 year old female with PMH  RA on chronic prednisone , morbid obesity, bilateral lower extremity lymphedema recently discharged from [facility] for associated cellulitis on 7/24 now being admitted to the hospital with sepsis due to recurrent cellulitis.   She is followed as an outpatient by wound care, was recently hospitalized with concerns for cellulitis until 7/24.  She was discharged home on p.o. Keflex .  Patient and daughter say that she was doing well overall when she was discharged from the hospital, however in the last 3 days she has had progressive development of a blistering lesion on the left lateral upper leg, with associated pain, and erythema.  She has had some subjective chills, but denies fever.  She denies any cough, shortness of breath, nausea, diarrhea or other new symptoms.  Patient and daughter are concerned that she has tracking erythema of the left side of her leg, which has been worsening.  In the meantime, she has continued with p.o. Keflex  which was prescribed during her last hospital admission.  After initiation on vancomycin  on admission, streaking erythema improved.  A&P:  Sepsis - meeting criteria with tachycardia, leukocytosis, source is left lower extremity cellulitis.  No evidence of endorgan dysfunction. -Empiric IV vancomycin  -Follow-up blood cultures   History of C. difficile colitis-monitor closely for diarrhea, and try to minimize antibiotics as possible   Normocytic anemia -No obvious bleeding.  Some downtrend today - Repeat CBC in a.m.   Thrombocytosis - possibly reactive to acute infection -Trend CBC   Rheumatoid arthritis-prednisone  5 mg p.o. daily   Hyperlipidemia-Zetia    Depression-continue Lexapro  and Wellbutrin    Chronic pain-continue home gabapentin     History of PE-continue home apixaban    Morbid obesity-BMI 51.1, complicating all aspects of care  Interval History:  No events overnight.  States that her erythema involving the left lower extremity has improved after antibiotics.   Old records reviewed in assessment of this patient  Antimicrobials: Vancomycin  09/25/2023 >> current  DVT prophylaxis:   apixaban  (ELIQUIS ) tablet 5 mg   Code Status:   Code Status: Full Code  Mobility Assessment (Last 72 Hours)     Mobility Assessment     Row Name 09/26/23 1003 09/25/23 2000         Does the patient have exclusion criteria? No - Perform mobility assessment No - Perform mobility assessment      What is the highest level of mobility based on the mobility assessment? Level 3 (Stands with assistance) - Balance while standing  and cannot march in place Level 3 (Stands with assistance) - Balance while standing  and cannot march in place      Is the above level different from baseline mobility prior to current illness? Yes - Recommend PT order Yes - Recommend PT order         Barriers to discharge: None Disposition Plan: Home HH orders placed: N/A Status is: Inpatient  Objective: Blood pressure 130/66, pulse 83, temperature 98.2 F (36.8 C), temperature source Oral, resp. rate 16, height 6' (1.829 m), weight (!) 171 kg, last menstrual period 01/31/2011, SpO2 98%.  Examination:  Physical Exam Constitutional:      Appearance: Normal appearance.  HENT:     Head: Normocephalic and atraumatic.     Mouth/Throat:  Mouth: Mucous membranes are moist.  Eyes:     Extraocular Movements: Extraocular movements intact.  Cardiovascular:     Rate and Rhythm: Normal rate and regular rhythm.  Pulmonary:     Effort: Pulmonary effort is normal. No respiratory distress.     Breath sounds: Normal breath sounds. No wheezing.  Abdominal:     General: Bowel sounds are normal. There is no distension.     Palpations: Abdomen is soft.      Tenderness: There is no abdominal tenderness.  Musculoskeletal:     Cervical back: Normal range of motion and neck supple.     Comments: Severe bilateral lower extremity lymphedema with elephantiasis; left lower extremity larger compared to right  Skin:    General: Skin is warm and dry.  Neurological:     General: No focal deficit present.     Mental Status: She is alert.  Psychiatric:        Mood and Affect: Mood normal.      Consultants:    Procedures:    Data Reviewed: Results for orders placed or performed during the hospital encounter of 09/25/23 (from the past 24 hours)  Lactic acid, plasma     Status: None   Collection Time: 09/25/23  3:03 PM  Result Value Ref Range   Lactic Acid, Venous 1.9 0.5 - 1.9 mmol/L  Comprehensive metabolic panel     Status: Abnormal   Collection Time: 09/25/23  3:10 PM  Result Value Ref Range   Sodium 138 135 - 145 mmol/L   Potassium 3.6 3.5 - 5.1 mmol/L   Chloride 103 98 - 111 mmol/L   CO2 26 22 - 32 mmol/L   Glucose, Bld 133 (H) 70 - 99 mg/dL   BUN 10 6 - 20 mg/dL   Creatinine, Ser 8.92 (H) 0.44 - 1.00 mg/dL   Calcium  9.1 8.9 - 10.3 mg/dL   Total Protein 8.2 (H) 6.5 - 8.1 g/dL   Albumin 2.7 (L) 3.5 - 5.0 g/dL   AST 20 15 - 41 U/L   ALT 13 0 - 44 U/L   Alkaline Phosphatase 42 38 - 126 U/L   Total Bilirubin 1.9 (H) 0.0 - 1.2 mg/dL   GFR, Estimated >39 >39 mL/min   Anion gap 9 5 - 15  CBC with Differential     Status: Abnormal   Collection Time: 09/25/23  3:10 PM  Result Value Ref Range   WBC 15.4 (H) 4.0 - 10.5 K/uL   RBC 3.31 (L) 3.87 - 5.11 MIL/uL   Hemoglobin 9.3 (L) 12.0 - 15.0 g/dL   HCT 69.8 (L) 63.9 - 53.9 %   MCV 90.9 80.0 - 100.0 fL   MCH 28.1 26.0 - 34.0 pg   MCHC 30.9 30.0 - 36.0 g/dL   RDW 82.3 (H) 88.4 - 84.4 %   Platelets 430 (H) 150 - 400 K/uL   nRBC 0.0 0.0 - 0.2 %   Neutrophils Relative % 79 %   Neutro Abs 12.2 (H) 1.7 - 7.7 K/uL   Lymphocytes Relative 9 %   Lymphs Abs 1.4 0.7 - 4.0 K/uL   Monocytes  Relative 3 %   Monocytes Absolute 0.5 0.1 - 1.0 K/uL   Eosinophils Relative 2 %   Eosinophils Absolute 0.3 0.0 - 0.5 K/uL   Basophils Relative 1 %   Basophils Absolute 0.1 0.0 - 0.1 K/uL   WBC Morphology See Note    RBC Morphology MORPHOLOGY UNREMARKABLE    Smear Review Normal platelet  morphology    Immature Granulocytes 6 %   Abs Immature Granulocytes 0.95 (H) 0.00 - 0.07 K/uL  Culture, blood (single)     Status: None (Preliminary result)   Collection Time: 09/25/23  3:10 PM   Specimen: BLOOD RIGHT HAND  Result Value Ref Range   Specimen Description      BLOOD RIGHT HAND Performed at [facility], [address] [place], [place] [address]    Special Requests      BOTTLES DRAWN AEROBIC AND ANAEROBIC Blood Culture adequate volume Performed at [facility], [address] [place], [place] [address]    Culture      NO GROWTH < 24 HOURS Performed at [facility], [address] [place], [place] [address]    Report Status PENDING   Lactic acid, plasma     Status: Abnormal   Collection Time: 09/25/23  8:45 PM  Result Value Ref Range   Lactic Acid, Venous 2.0 (HH) 0.5 - 1.9 mmol/L  Basic metabolic panel     Status: Abnormal   Collection Time: 09/26/23  4:43 AM  Result Value Ref Range   Sodium 135 135 - 145 mmol/L   Potassium 3.8 3.5 - 5.1 mmol/L   Chloride 105 98 - 111 mmol/L   CO2 25 22 - 32 mmol/L   Glucose, Bld 134 (H) 70 - 99 mg/dL   BUN 8 6 - 20 mg/dL   Creatinine, Ser 9.05 0.44 - 1.00 mg/dL   Calcium  8.1 (L) 8.9 - 10.3 mg/dL   GFR, Estimated >39 >39 mL/min   Anion gap 5 5 - 15  CBC     Status: Abnormal   Collection Time: 09/26/23  4:43 AM  Result Value Ref Range   WBC 13.2 (H) 4.0 - 10.5 K/uL   RBC 2.74 (L) 3.87 - 5.11 MIL/uL   Hemoglobin 7.4 (L) 12.0 - 15.0 g/dL   HCT 74.8 (L) 63.9 - 53.9 %   MCV 91.6 80.0 - 100.0 fL   MCH 27.0 26.0 - 34.0 pg   MCHC 29.5 (L) 30.0 - 36.0 g/dL   RDW 82.1 (H) 88.4 - 84.4 %   Platelets 378 150 -  400 K/uL   nRBC 0.0 0.0 - 0.2 %    I have reviewed pertinent nursing notes, vitals, labs, and images as necessary. I have ordered labwork to follow up on as indicated.  I have reviewed the last notes from staff over past 24 hours. I have discussed patient's care plan and test results with nursing staff, CM/SW, and other staff as appropriate.  Time spent: Greater than 50% of the 55 minute visit was spent in counseling/coordination of care for the patient as laid out in the A&P.   LOS: 1 day   Alm Apo, MD [facility] 09/26/2023, 1:19 PM

## 2023-09-26 NOTE — Consult Note (Signed)
 WOC Nurse Consult Note:patient is known to [facility] team from previous admissions; has a longstanding history of lymphedema followed at lymphedema clinic [facility] (last seen 09/03/2023) and at [facility] for venous ulcers last seen 08/29/2023 when ordered Aquacel AG, Kerlix roll gauze and Coban daily  Reason for Consult:lymphedema  Wound type: full thickness L lateral leg r/t venous insufficiency  Pressure Injury POA: NA  Measurement: see nursing flowsheet  Wound bed: yellow and red moist  Drainage (amount, consistency, odor) yellow exudate noted on old dressing  Periwound: chronic changes consistent with lymphedema  Dressing procedure/placement/frequency: Cleanse L lateral leg wound and any other wounds noted to legs with Vashe wound cleanser Soila [phone]) do not rinse and allow to air dry.  Apply silver hydrofiber (Aquacel AG Lawson #[id number]) cut to fit wound beds daily, cover with ABD pads and secure with Kerlix roll guaze beginning right above toes and ending right below knees.  Apply Coban 4 Soila [phone]) wrapped in same fashion as Kerlix making sure to start above toes and end below knee.    Patient should resume ongoing management of lymphedema and ulcerations at [facility] as outpatient.   POC discussed with bedside nurse. WOC team will not follow. Re-consult if further needs arise.   Thank you,    Osei Anger MSN, RN-BC, CWOCN

## 2023-09-26 NOTE — Hospital Course (Addendum)
 [name] is a 58 year old female with PMH  RA on chronic prednisone , morbid obesity, bilateral lower extremity lymphedema recently discharged from [facility] for associated cellulitis on 7/24 now being admitted to the hospital with sepsis due to recurrent cellulitis.   She is followed as an outpatient by wound care, was recently hospitalized with concerns for cellulitis until 7/24.  She was discharged home on p.o. Keflex .  Patient and daughter say that she was doing well overall when she was discharged from the hospital, however in the last 3 days she has had progressive development of a blistering lesion on the left lateral upper leg, with associated pain, and erythema.  She has had some subjective chills, but denies fever.  She denies any cough, shortness of breath, nausea, diarrhea or other new symptoms.  Patient and daughter are concerned that she has tracking erythema of the left side of her leg, which has been worsening.  In the meantime, she has continued with p.o. Keflex  which was prescribed during her last hospital admission.  After initiation on vancomycin  on admission, streaking erythema improved.  A&P:  Sepsis due to lower extremity cellulitis-resolved Chronic lower extremity lymphedema Elephantiasis - meeting criteria with tachycardia, leukocytosis, source is left lower extremity cellulitis.  No evidence of endorgan dysfunction. -Empiric IV vancomycin ; completed 5 days inpt and discharged on 5 days Bactrim  to complete 10 days total  -Follow-up blood cultures; negative to date -Continue wound care and dressing changes - She plans to follow-up outpatient with lymphedema clinic   History of C. difficile colitis-monitor closely for diarrhea, and try to minimize antibiotics as possible   Normocytic anemia -No obvious bleeding - Trend CBC   Thrombocytosis - possibly reactive to acute infection   Rheumatoid arthritis-prednisone  5 mg p.o. daily   Hyperlipidemia-Zetia    Depression-continue  Lexapro  and Wellbutrin    Chronic pain-continue home gabapentin    History of PE-continue home apixaban    Morbid obesity-BMI 51.1, complicating all aspects of care

## 2023-09-27 DIAGNOSIS — L0390 Cellulitis, unspecified: Secondary | ICD-10-CM | POA: Diagnosis not present

## 2023-09-27 DIAGNOSIS — I10 Essential (primary) hypertension: Secondary | ICD-10-CM

## 2023-09-27 DIAGNOSIS — D649 Anemia, unspecified: Secondary | ICD-10-CM | POA: Diagnosis not present

## 2023-09-27 DIAGNOSIS — A419 Sepsis, unspecified organism: Secondary | ICD-10-CM | POA: Diagnosis not present

## 2023-09-27 LAB — BASIC METABOLIC PANEL WITH GFR
Anion gap: 8 (ref 5–15)
BUN: 7 mg/dL (ref 6–20)
CO2: 25 mmol/L (ref 22–32)
Calcium: 8.5 mg/dL — ABNORMAL LOW (ref 8.9–10.3)
Chloride: 104 mmol/L (ref 98–111)
Creatinine, Ser: 0.71 mg/dL (ref 0.44–1.00)
GFR, Estimated: >60 mL/min (ref >60)
Glucose, Bld: 154 mg/dL — ABNORMAL HIGH (ref 70–99)
Potassium: 3.8 mmol/L (ref 3.5–5.1)
Sodium: 137 mmol/L (ref 135–145)

## 2023-09-27 LAB — CBC WITH DIFFERENTIAL/PLATELET
Abs Immature Granulocytes: 0.42 K/uL — ABNORMAL HIGH (ref 0.00–0.07)
Basophils Absolute: 0.1 K/uL (ref 0.0–0.1)
Basophils Relative: 0 %
Eosinophils Absolute: 0.3 K/uL (ref 0.0–0.5)
Eosinophils Relative: 3 %
HCT: 28.5 % — ABNORMAL LOW (ref 36.0–46.0)
Hemoglobin: 8.2 g/dL — ABNORMAL LOW (ref 12.0–15.0)
Immature Granulocytes: 4 %
Lymphocytes Relative: 9 %
Lymphs Abs: 1.1 K/uL (ref 0.7–4.0)
MCH: 26.9 pg (ref 26.0–34.0)
MCHC: 28.8 g/dL — ABNORMAL LOW (ref 30.0–36.0)
MCV: 93.4 fL (ref 80.0–100.0)
Monocytes Absolute: 0.7 K/uL (ref 0.1–1.0)
Monocytes Relative: 6 %
Neutro Abs: 9 K/uL — ABNORMAL HIGH (ref 1.7–7.7)
Neutrophils Relative %: 78 %
Platelets: 405 K/uL — ABNORMAL HIGH (ref 150–400)
RBC: 3.05 MIL/uL — ABNORMAL LOW (ref 3.87–5.11)
RDW: 18.1 % — ABNORMAL HIGH (ref 11.5–15.5)
WBC: 11.5 K/uL — ABNORMAL HIGH (ref 4.0–10.5)
nRBC: 0 % (ref 0.0–0.2)

## 2023-09-27 LAB — MAGNESIUM: Magnesium: 2 mg/dL (ref 1.7–2.4)

## 2023-09-27 MED ORDER — VANCOMYCIN HCL 1500 MG/300ML IV SOLN
1500.0000 mg | Freq: Two times a day (BID) | INTRAVENOUS | Status: DC
  Administered 2023-09-27 – 2023-09-29 (×4): 1500 mg via INTRAVENOUS
  Filled 2023-09-27 (×5): qty 300

## 2023-09-27 NOTE — Telephone Encounter (Signed)
 I think this is something the wound/lymphedema clinic should be seeing her for?? Has she reached out to them about this and if so what did they say?

## 2023-09-27 NOTE — Progress Notes (Signed)
 Pharmacy Antibiotic Note  [name] is a 58 y.o. female admitted on 09/25/2023 with cellulitis.  Pharmacy has been consulted for Vancomycin  dosing.  Plan: Vancomycin  1500 mg IV q12h  (SCr 0.8, Vd 0.5, est AUC 436) Measure Vanc levels as needed.  Goal AUC = 400 - 550  Follow up renal function, culture results, and clinical course.   Height: 6' (182.9 cm) Weight: (!) 171 kg (376 lb 15.8 oz) IBW/kg (Calculated) : 73.1  Temp (24hrs), Avg:98.3 F (36.8 C), Min:97.8 F (36.6 C), Max:99 F (37.2 C)  Recent Labs  Lab 09/25/23 1503 09/25/23 1510 09/25/23 2045 09/26/23 0443 09/27/23 0509  WBC  --  15.4*  --  13.2* 11.5*  CREATININE  --  1.07*  --  0.94 0.71  LATICACIDVEN 1.9  --  2.0*  --   --     Estimated Creatinine Clearance: 140.9 mL/min (by C-G formula based on SCr of 0.71 mg/dL).    Allergies  Allergen Reactions   Abatacept Anaphylaxis and Other (See Comments)    Orencia: Anaphylaxis-Streptomycin Clickject    Bee Venom Anaphylaxis   Cefadroxil  Diarrhea   Augmentin  [Amoxicillin -Pot Clavulanate] Hives, Itching and Other (See Comments)    Did PCN reaction causing immediate rash, facial/tongue/throat swelling, SOB or lightheadedness with hypotension: yes Did PCN reaction causing severe rash involving mucus membranes or skin necrosis: no Has patient had a PCN reaction that required hospitalization: in hospital Has patient had a PCN reaction occurring within the last 10 years: no If all of the above answers are NO, then may proceed with Cephalosporin use.  Pt reports no recollection of any reactions when taking penicillin in past   Latex Hives and Other (See Comments)    Local reaction (welts). Patient denies any wheezing or other reaction with latex exposure.   Keflex  [Cephalexin ] Diarrhea, Nausea Only and Other (See Comments)    Causes extreme sleepiness, nausea, and diarrhea   Nulytely  [Peg 3350 -Kcl-Na Bicarb-Nacl] Nausea And Vomiting and Other (See Comments)     NAUSEA AND VOMITING. MAY TOLERATE LOW VOLUME PREP.   Tomato Other (See Comments)    Caused sores in the mouth    Antimicrobials this admission: 7/29 Vancomycin  >>   Dose adjustments this admission:  Microbiology results: 7/29 BCx: ngtd  Thank you for allowing pharmacy to be a part of this patient's care.  Wanda Hasting PharmD, BCPS [facility] main pharmacy [phone] 09/27/2023 1:29 PM

## 2023-09-27 NOTE — Progress Notes (Addendum)
 Progress Note    [name]   FMW:[id number]  DOB: [birth year]  DOA: 09/25/2023     2 PCP: Theotis Haze ORN, NP  Initial CC: LLE redness and pain  Hospital Course: [name] is a 58 year old female with PMH  RA on chronic prednisone , morbid obesity, bilateral lower extremity lymphedema recently discharged from [facility] for associated cellulitis on 7/24 now being admitted to the hospital with sepsis due to recurrent cellulitis.   She is followed as an outpatient by wound care, was recently hospitalized with concerns for cellulitis until 7/24.  She was discharged home on p.o. Keflex .  Patient and daughter say that she was doing well overall when she was discharged from the hospital, however in the last 3 days she has had progressive development of a blistering lesion on the left lateral upper leg, with associated pain, and erythema.  She has had some subjective chills, but denies fever.  She denies any cough, shortness of breath, nausea, diarrhea or other new symptoms.  Patient and daughter are concerned that she has tracking erythema of the left side of her leg, which has been worsening.  In the meantime, she has continued with p.o. Keflex  which was prescribed during her last hospital admission.  After initiation on vancomycin  on admission, streaking erythema improved.  A&P:  Sepsis due to lower extremity cellulitis-resolved Chronic lower extremity lymphedema Elephantiasis - meeting criteria with tachycardia, leukocytosis, source is left lower extremity cellulitis.  No evidence of endorgan dysfunction. -Empiric IV vancomycin  -Follow-up blood cultures; negative to date -Continue wound care and dressing changes   History of C. difficile colitis-monitor closely for diarrhea, and try to minimize antibiotics as possible   Normocytic anemia -No obvious bleeding - Trend CBC   Thrombocytosis - possibly reactive to acute infection -Trend CBC   Rheumatoid arthritis-prednisone  5 mg p.o.  daily   Hyperlipidemia-Zetia    Depression-continue Lexapro  and Wellbutrin    Chronic pain-continue home gabapentin    History of PE-continue home apixaban    Morbid obesity-BMI 51.1, complicating all aspects of care  Interval History:  Left leg wrapped this morning.  She feels some improvement in terms of the warmth and pain.  Redness was also improving when she saw it previously.   Old records reviewed in assessment of this patient  Antimicrobials: Vancomycin  09/25/2023 >> current  DVT prophylaxis:   apixaban  (ELIQUIS ) tablet 5 mg   Code Status:   Code Status: Full Code  Mobility Assessment (Last 72 Hours)     Mobility Assessment     Row Name 09/27/23 0819 09/26/23 2104 09/26/23 1003 09/25/23 2000     Does the patient have exclusion criteria? No - Perform mobility assessment No - Perform mobility assessment No - Perform mobility assessment No - Perform mobility assessment    What is the highest level of mobility based on the mobility assessment? Level 4 (Ambulates with assistance) - Balance while stepping forward/back - Complete Level 3 (Stands with assistance) - Balance while standing  and cannot march in place Level 3 (Stands with assistance) - Balance while standing  and cannot march in place Level 3 (Stands with assistance) - Balance while standing  and cannot march in place    Is the above level different from baseline mobility prior to current illness? Yes - Recommend PT order Yes - Recommend PT order Yes - Recommend PT order Yes - Recommend PT order       Barriers to discharge: None Disposition Plan: Home HH orders placed: N/A Status is: Inpatient  Objective: Blood pressure 130/75, pulse 78, temperature 97.8 F (36.6 C), temperature source Oral, resp. rate 16, height 6' (1.829 m), weight (!) 171 kg, last menstrual period 01/31/2011, SpO2 100%.  Examination:  Physical Exam Constitutional:      Appearance: Normal appearance.  HENT:     Head: Normocephalic and  atraumatic.     Mouth/Throat:     Mouth: Mucous membranes are moist.  Eyes:     Extraocular Movements: Extraocular movements intact.  Cardiovascular:     Rate and Rhythm: Normal rate and regular rhythm.  Pulmonary:     Effort: Pulmonary effort is normal. No respiratory distress.     Breath sounds: Normal breath sounds. No wheezing.  Abdominal:     General: Bowel sounds are normal. There is no distension.     Palpations: Abdomen is soft.     Tenderness: There is no abdominal tenderness.  Musculoskeletal:     Cervical back: Normal range of motion and neck supple.     Comments: Severe bilateral lower extremity lymphedema with elephantiasis; left lower extremity larger compared to right  Skin:    General: Skin is warm and dry.  Neurological:     General: No focal deficit present.     Mental Status: She is alert.  Psychiatric:        Mood and Affect: Mood normal.      Consultants:    Procedures:    Data Reviewed: Results for orders placed or performed during the hospital encounter of 09/25/23 (from the past 24 hours)  Basic metabolic panel with GFR     Status: Abnormal   Collection Time: 09/27/23  5:09 AM  Result Value Ref Range   Sodium 137 135 - 145 mmol/L   Potassium 3.8 3.5 - 5.1 mmol/L   Chloride 104 98 - 111 mmol/L   CO2 25 22 - 32 mmol/L   Glucose, Bld 154 (H) 70 - 99 mg/dL   BUN 7 6 - 20 mg/dL   Creatinine, Ser 9.28 0.44 - 1.00 mg/dL   Calcium  8.5 (L) 8.9 - 10.3 mg/dL   GFR, Estimated >39 >39 mL/min   Anion gap 8 5 - 15  CBC with Differential/Platelet     Status: Abnormal   Collection Time: 09/27/23  5:09 AM  Result Value Ref Range   WBC 11.5 (H) 4.0 - 10.5 K/uL   RBC 3.05 (L) 3.87 - 5.11 MIL/uL   Hemoglobin 8.2 (L) 12.0 - 15.0 g/dL   HCT 71.4 (L) 63.9 - 53.9 %   MCV 93.4 80.0 - 100.0 fL   MCH 26.9 26.0 - 34.0 pg   MCHC 28.8 (L) 30.0 - 36.0 g/dL   RDW 81.8 (H) 88.4 - 84.4 %   Platelets 405 (H) 150 - 400 K/uL   nRBC 0.0 0.0 - 0.2 %   Neutrophils  Relative % 78 %   Neutro Abs 9.0 (H) 1.7 - 7.7 K/uL   Lymphocytes Relative 9 %   Lymphs Abs 1.1 0.7 - 4.0 K/uL   Monocytes Relative 6 %   Monocytes Absolute 0.7 0.1 - 1.0 K/uL   Eosinophils Relative 3 %   Eosinophils Absolute 0.3 0.0 - 0.5 K/uL   Basophils Relative 0 %   Basophils Absolute 0.1 0.0 - 0.1 K/uL   Immature Granulocytes 4 %   Abs Immature Granulocytes 0.42 (H) 0.00 - 0.07 K/uL  Magnesium      Status: None   Collection Time: 09/27/23  5:09 AM  Result Value Ref Range  Magnesium  2.0 1.7 - 2.4 mg/dL    I have reviewed pertinent nursing notes, vitals, labs, and images as necessary. I have ordered labwork to follow up on as indicated.  I have reviewed the last notes from staff over past 24 hours. I have discussed patient's care plan and test results with nursing staff, CM/SW, and other staff as appropriate.  Time spent: Greater than 50% of the 55 minute visit was spent in counseling/coordination of care for the patient as laid out in the A&P.   LOS: 2 days   Alm Apo, MD [facility] 09/27/2023, 2:20 PM

## 2023-09-28 DIAGNOSIS — A419 Sepsis, unspecified organism: Secondary | ICD-10-CM | POA: Diagnosis not present

## 2023-09-28 DIAGNOSIS — D649 Anemia, unspecified: Secondary | ICD-10-CM | POA: Diagnosis not present

## 2023-09-28 DIAGNOSIS — I890 Lymphedema, not elsewhere classified: Secondary | ICD-10-CM

## 2023-09-28 DIAGNOSIS — L0390 Cellulitis, unspecified: Secondary | ICD-10-CM | POA: Diagnosis not present

## 2023-09-28 LAB — BASIC METABOLIC PANEL WITH GFR
Anion gap: 4 — ABNORMAL LOW (ref 5–15)
BUN: 8 mg/dL (ref 6–20)
CO2: 26 mmol/L (ref 22–32)
Calcium: 8.6 mg/dL — ABNORMAL LOW (ref 8.9–10.3)
Chloride: 107 mmol/L (ref 98–111)
Creatinine, Ser: 0.89 mg/dL (ref 0.44–1.00)
GFR, Estimated: >60 mL/min (ref >60)
Glucose, Bld: 149 mg/dL — ABNORMAL HIGH (ref 70–99)
Potassium: 3.9 mmol/L (ref 3.5–5.1)
Sodium: 137 mmol/L (ref 135–145)

## 2023-09-28 LAB — CBC WITH DIFFERENTIAL/PLATELET
Abs Immature Granulocytes: 0.38 K/uL — ABNORMAL HIGH (ref 0.00–0.07)
Basophils Absolute: 0 K/uL (ref 0.0–0.1)
Basophils Relative: 0 %
Eosinophils Absolute: 0.3 K/uL (ref 0.0–0.5)
Eosinophils Relative: 3 %
HCT: 27.2 % — ABNORMAL LOW (ref 36.0–46.0)
Hemoglobin: 7.9 g/dL — ABNORMAL LOW (ref 12.0–15.0)
Immature Granulocytes: 4 %
Lymphocytes Relative: 10 %
Lymphs Abs: 1 K/uL (ref 0.7–4.0)
MCH: 27.6 pg (ref 26.0–34.0)
MCHC: 29 g/dL — ABNORMAL LOW (ref 30.0–36.0)
MCV: 95.1 fL (ref 80.0–100.0)
Monocytes Absolute: 0.7 K/uL (ref 0.1–1.0)
Monocytes Relative: 7 %
Neutro Abs: 7.7 K/uL (ref 1.7–7.7)
Neutrophils Relative %: 76 %
Platelets: 398 K/uL (ref 150–400)
RBC: 2.86 MIL/uL — ABNORMAL LOW (ref 3.87–5.11)
RDW: 17.9 % — ABNORMAL HIGH (ref 11.5–15.5)
WBC: 10 K/uL (ref 4.0–10.5)
nRBC: 0 % (ref 0.0–0.2)

## 2023-09-28 LAB — MAGNESIUM: Magnesium: 2.1 mg/dL (ref 1.7–2.4)

## 2023-09-28 NOTE — Telephone Encounter (Signed)
 Disregard last message.

## 2023-09-28 NOTE — Plan of Care (Signed)

## 2023-09-28 NOTE — Progress Notes (Signed)
 Progress Note    [name]   FMW:[id number]  DOB: 04-02-1965  DOA: 09/25/2023     3 PCP: Theotis Haze ORN, NP  Initial CC: LLE redness and pain  Hospital Course: [name] is a 58 year old female with PMH  RA on chronic prednisone , morbid obesity, bilateral lower extremity lymphedema recently discharged from [facility] for associated cellulitis on 7/24 now being admitted to the hospital with sepsis due to recurrent cellulitis.   She is followed as an outpatient by wound care, was recently hospitalized with concerns for cellulitis until 7/24.  She was discharged home on p.o. Keflex .  Patient and daughter say that she was doing well overall when she was discharged from the hospital, however in the last 3 days she has had progressive development of a blistering lesion on the left lateral upper leg, with associated pain, and erythema.  She has had some subjective chills, but denies fever.  She denies any cough, shortness of breath, nausea, diarrhea or other new symptoms.  Patient and daughter are concerned that she has tracking erythema of the left side of her leg, which has been worsening.  In the meantime, she has continued with p.o. Keflex  which was prescribed during her last hospital admission.  After initiation on vancomycin  on admission, streaking erythema improved.  A&P:  Sepsis due to lower extremity cellulitis-resolved Chronic lower extremity lymphedema Elephantiasis - meeting criteria with tachycardia, leukocytosis, source is left lower extremity cellulitis.  No evidence of endorgan dysfunction. -Empiric IV vancomycin  -Follow-up blood cultures; negative to date -Continue wound care and dressing changes - Likely will de-escalate to oral regimen tomorrow and discharge at that time - She plans to follow-up outpatient with lymphedema clinic   History of C. difficile colitis-monitor closely for diarrhea, and try to minimize antibiotics as possible   Normocytic anemia -No obvious  bleeding - Trend CBC   Thrombocytosis - possibly reactive to acute infection -Trend CBC   Rheumatoid arthritis-prednisone  5 mg p.o. daily   Hyperlipidemia-Zetia    Depression-continue Lexapro  and Wellbutrin    Chronic pain-continue home gabapentin    History of PE-continue home apixaban    Morbid obesity-BMI 51.1, complicating all aspects of care  Interval History:  Left leg wrapped this morning.  She feels some improvement in terms of the warmth and pain.  Redness was also improving when she saw it previously. She feels more comfortable with 1 more night in the hospital and tentative discharge tomorrow.  In general she has been improving and feeling better.   Old records reviewed in assessment of this patient  Antimicrobials: Vancomycin  09/25/2023 >> current  DVT prophylaxis:   apixaban  (ELIQUIS ) tablet 5 mg   Code Status:   Code Status: Full Code  Mobility Assessment (Last 72 Hours)     Mobility Assessment     Row Name 09/28/23 1018 09/27/23 2023 09/27/23 0819 09/26/23 2104 09/26/23 1003   Does the patient have exclusion criteria? No - Perform mobility assessment No - Perform mobility assessment No - Perform mobility assessment No - Perform mobility assessment No - Perform mobility assessment   What is the highest level of mobility based on the mobility assessment? Level 3 (Stands with assistance) - Balance while standing  and cannot march in place Level 3 (Stands with assistance) - Balance while standing  and cannot march in place Level 4 (Ambulates with assistance) - Balance while stepping forward/back - Complete Level 3 (Stands with assistance) - Balance while standing  and cannot march in place Level 3 (Stands with assistance) -  Balance while standing  and cannot march in place   Is the above level different from baseline mobility prior to current illness? Yes - Recommend PT order Yes - Recommend PT order Yes - Recommend PT order Yes - Recommend PT order Yes - Recommend PT  order    Row Name 09/25/23 2000           Does the patient have exclusion criteria? No - Perform mobility assessment       What is the highest level of mobility based on the mobility assessment? Level 3 (Stands with assistance) - Balance while standing  and cannot march in place       Is the above level different from baseline mobility prior to current illness? Yes - Recommend PT order          Barriers to discharge: None Disposition Plan: Home HH orders placed: N/A Status is: Inpatient  Objective: Blood pressure 136/70, pulse 78, temperature 98.6 F (37 C), temperature source Oral, resp. rate 18, height 6' (1.829 m), weight (!) 171 kg, last menstrual period 01/31/2011, SpO2 97%.  Examination:  Physical Exam Constitutional:      Appearance: Normal appearance.  HENT:     Head: Normocephalic and atraumatic.     Mouth/Throat:     Mouth: Mucous membranes are moist.  Eyes:     Extraocular Movements: Extraocular movements intact.  Cardiovascular:     Rate and Rhythm: Normal rate and regular rhythm.  Pulmonary:     Effort: Pulmonary effort is normal. No respiratory distress.     Breath sounds: Normal breath sounds. No wheezing.  Abdominal:     General: Bowel sounds are normal. There is no distension.     Palpations: Abdomen is soft.     Tenderness: There is no abdominal tenderness.  Musculoskeletal:     Cervical back: Normal range of motion and neck supple.     Comments: Severe bilateral lower extremity lymphedema with elephantiasis; left lower extremity larger compared to right  Skin:    General: Skin is warm and dry.  Neurological:     General: No focal deficit present.     Mental Status: She is alert.  Psychiatric:        Mood and Affect: Mood normal.      Consultants:    Procedures:    Data Reviewed: Results for orders placed or performed during the hospital encounter of 09/25/23 (from the past 24 hours)  Basic metabolic panel with GFR     Status: Abnormal    Collection Time: 09/28/23  4:38 AM  Result Value Ref Range   Sodium 137 135 - 145 mmol/L   Potassium 3.9 3.5 - 5.1 mmol/L   Chloride 107 98 - 111 mmol/L   CO2 26 22 - 32 mmol/L   Glucose, Bld 149 (H) 70 - 99 mg/dL   BUN 8 6 - 20 mg/dL   Creatinine, Ser 9.10 0.44 - 1.00 mg/dL   Calcium  8.6 (L) 8.9 - 10.3 mg/dL   GFR, Estimated >39 >39 mL/min   Anion gap 4 (L) 5 - 15  CBC with Differential/Platelet     Status: Abnormal   Collection Time: 09/28/23  4:38 AM  Result Value Ref Range   WBC 10.0 4.0 - 10.5 K/uL   RBC 2.86 (L) 3.87 - 5.11 MIL/uL   Hemoglobin 7.9 (L) 12.0 - 15.0 g/dL   HCT 72.7 (L) 63.9 - 53.9 %   MCV 95.1 80.0 - 100.0 fL   MCH 27.6  26.0 - 34.0 pg   MCHC 29.0 (L) 30.0 - 36.0 g/dL   RDW 82.0 (H) 88.4 - 84.4 %   Platelets 398 150 - 400 K/uL   nRBC 0.0 0.0 - 0.2 %   Neutrophils Relative % 76 %   Neutro Abs 7.7 1.7 - 7.7 K/uL   Lymphocytes Relative 10 %   Lymphs Abs 1.0 0.7 - 4.0 K/uL   Monocytes Relative 7 %   Monocytes Absolute 0.7 0.1 - 1.0 K/uL   Eosinophils Relative 3 %   Eosinophils Absolute 0.3 0.0 - 0.5 K/uL   Basophils Relative 0 %   Basophils Absolute 0.0 0.0 - 0.1 K/uL   Immature Granulocytes 4 %   Abs Immature Granulocytes 0.38 (H) 0.00 - 0.07 K/uL  Magnesium      Status: None   Collection Time: 09/28/23  4:38 AM  Result Value Ref Range   Magnesium  2.1 1.7 - 2.4 mg/dL    I have reviewed pertinent nursing notes, vitals, labs, and images as necessary. I have ordered labwork to follow up on as indicated.  I have reviewed the last notes from staff over past 24 hours. I have discussed patient's care plan and test results with nursing staff, CM/SW, and other staff as appropriate.  Time spent: Greater than 50% of the 55 minute visit was spent in counseling/coordination of care for the patient as laid out in the A&P.   LOS: 3 days   Alm Apo, MD [facility] 09/28/2023, 12:43 PM

## 2023-09-29 DIAGNOSIS — D649 Anemia, unspecified: Secondary | ICD-10-CM | POA: Diagnosis not present

## 2023-09-29 DIAGNOSIS — I890 Lymphedema, not elsewhere classified: Secondary | ICD-10-CM | POA: Diagnosis not present

## 2023-09-29 DIAGNOSIS — L0390 Cellulitis, unspecified: Secondary | ICD-10-CM | POA: Diagnosis not present

## 2023-09-29 DIAGNOSIS — A419 Sepsis, unspecified organism: Secondary | ICD-10-CM | POA: Diagnosis not present

## 2023-09-29 MED ORDER — SULFAMETHOXAZOLE-TRIMETHOPRIM 800-160 MG PO TABS
1.0000 | ORAL_TABLET | Freq: Two times a day (BID) | ORAL | Status: DC
  Administered 2023-09-29: 1 via ORAL
  Filled 2023-09-29: qty 1

## 2023-09-29 MED ORDER — SULFAMETHOXAZOLE-TRIMETHOPRIM 800-160 MG PO TABS: 1.0000 | ORAL_TABLET | Freq: Two times a day (BID) | ORAL | 0 refills | Status: AC

## 2023-09-29 MED ORDER — OXYCODONE HCL 5 MG PO TABS: 5.0000 mg | ORAL_TABLET | Freq: Four times a day (QID) | ORAL | 0 refills | Status: DC | PRN

## 2023-09-29 NOTE — Plan of Care (Signed)

## 2023-09-29 NOTE — Discharge Summary (Signed)
 Physician Discharge Summary   [name] FMW:[id number] DOB: 1965/12/04 DOA: 09/25/2023  PCP: Theotis Haze ORN, NP  Admit date: 09/25/2023 Discharge date: 09/29/2023  Admitted From: Home Disposition:  Home Discharging physician: Alm Apo, MD Barriers to discharge: none  Recommendations at discharge: Follow-up with lymphedema clinic  Home Health: RN  Discharge Condition: stable CODE STATUS: Full  Diet recommendation:  Diet Orders (From admission, onward)     Start     Ordered   09/29/23 0000  Diet general        09/29/23 1102   09/25/23 1850  Diet regular Room service appropriate? Yes; Fluid consistency: Thin  Diet effective now       Question Answer Comment  Room service appropriate? Yes   Fluid consistency: Thin      09/25/23 1849            Hospital Course: [name] is a 58 year old female with PMH  RA on chronic prednisone , morbid obesity, bilateral lower extremity lymphedema recently discharged from [facility] for associated cellulitis on 7/24 now being admitted to the hospital with sepsis due to recurrent cellulitis.   She is followed as an outpatient by wound care, was recently hospitalized with concerns for cellulitis until 7/24.  She was discharged home on p.o. Keflex .  Patient and daughter say that she was doing well overall when she was discharged from the hospital, however in the last 3 days she has had progressive development of a blistering lesion on the left lateral upper leg, with associated pain, and erythema.  She has had some subjective chills, but denies fever.  She denies any cough, shortness of breath, nausea, diarrhea or other new symptoms.  Patient and daughter are concerned that she has tracking erythema of the left side of her leg, which has been worsening.  In the meantime, she has continued with p.o. Keflex  which was prescribed during her last hospital admission.  After initiation on vancomycin  on admission, streaking erythema  improved.  A&P:  Sepsis due to lower extremity cellulitis-resolved Chronic lower extremity lymphedema Elephantiasis - meeting criteria with tachycardia, leukocytosis, source is left lower extremity cellulitis.  No evidence of endorgan dysfunction. -Empiric IV vancomycin ; completed 5 days inpt and discharged on 5 days Bactrim  to complete 10 days total  -Follow-up blood cultures; negative to date -Continue wound care and dressing changes - She plans to follow-up outpatient with lymphedema clinic   History of C. difficile colitis-monitor closely for diarrhea, and try to minimize antibiotics as possible   Normocytic anemia -No obvious bleeding - Trend CBC   Thrombocytosis - possibly reactive to acute infection   Rheumatoid arthritis-prednisone  5 mg p.o. daily   Hyperlipidemia-Zetia    Depression-continue Lexapro  and Wellbutrin    Chronic pain-continue home gabapentin    History of PE-continue home apixaban    Morbid obesity-BMI 51.1, complicating all aspects of care    The patient's acute and chronic medical conditions were treated accordingly. On day of discharge, patient was felt deemed stable for discharge. Patient/family member advised to call PCP or come back to ER if needed.   Principal Diagnosis: Sepsis due to cellulitis [facility])  Discharge Diagnoses: Active Hospital Problems   Diagnosis Date Noted   Sepsis due to cellulitis (HCC) 12/31/2018    Priority: 1.   Pressure injury of skin 09/26/2023   Essential hypertension 11/07/2022   Rheumatoid arthritis (HCC) 03/06/2022   Lymphedema 03/06/2022   Elephantiasis 12/31/2018   Anemia 02/22/2011    Resolved Hospital Problems  No resolved problems to display.  Discharge Instructions     Diet general   Complete by: As directed    Discharge wound care:   Complete by: As directed    Resume home wound care as previously   Increase activity slowly   Complete by: As directed       Allergies as of 09/29/2023        Reactions   Abatacept Anaphylaxis, Other (See Comments)   Orencia: Anaphylaxis-Streptomycin Clickject   Bee Venom Anaphylaxis   Cefadroxil  Diarrhea   Augmentin  [amoxicillin -pot Clavulanate] Hives, Itching, Other (See Comments)   Did PCN reaction causing immediate rash, facial/tongue/throat swelling, SOB or lightheadedness with hypotension: yes Did PCN reaction causing severe rash involving mucus membranes or skin necrosis: no Has patient had a PCN reaction that required hospitalization: in hospital Has patient had a PCN reaction occurring within the last 10 years: no If all of the above answers are NO, then may proceed with Cephalosporin use. Pt reports no recollection of any reactions when taking penicillin in past   Latex Hives, Other (See Comments)   Local reaction (welts). Patient denies any wheezing or other reaction with latex exposure.   Keflex  [cephalexin ] Diarrhea, Nausea Only, Other (See Comments)   Causes extreme sleepiness, nausea, and diarrhea   Nulytely  [peg 3350 -kcl-na Bicarb-nacl] Nausea And Vomiting, Other (See Comments)   NAUSEA AND VOMITING. MAY TOLERATE LOW VOLUME PREP.   Tomato Other (See Comments)   Caused sores in the mouth        Medication List     STOP taking these medications    cephALEXin  500 MG capsule Commonly known as: KEFLEX        TAKE these medications    acetaminophen  325 MG tablet Commonly known as: TYLENOL  Take 1 tablet (325 mg total) by mouth every 6 (six) hours as needed for mild pain (pain score 1-3), fever or headache (or Fever >/= 101).   albuterol  108 (90 Base) MCG/ACT inhaler Commonly known as: VENTOLIN  HFA Inhale 2 puffs into the lungs every 6 (six) hours as needed for wheezing or shortness of breath.   apixaban  5 MG Tabs tablet Commonly known as: Eliquis  Take 1 tablet (5 mg total) by mouth 2 (two) times daily. What changed: when to take this   Bariatric Rollator Misc Please provide patient with insurance approved  bariatric rollator walker. I89.0, R53.81, R26.81   buPROPion  150 MG 24 hr tablet Commonly known as: Wellbutrin  XL Take 1 tablet (150 mg total) by mouth daily. What changed: when to take this   Cimzia (2 Syringe) 200 MG/ML prefilled syringe Generic drug: certolizumab pegol Inject 200 mg into the skin every 14 (fourteen) days.   escitalopram  10 MG tablet Commonly known as: Lexapro  Take 1 tablet (10 mg total) by mouth daily. For depression What changed: when to take this   ezetimibe  10 MG tablet Commonly known as: Zetia  Take 1 tablet (10 mg total) by mouth daily. For high cholesterol What changed: when to take this   famotidine  40 MG tablet Commonly known as: PEPCID  Take 1 tablet by mouth once daily What changed: when to take this   folic acid  1 MG tablet Commonly known as: FOLVITE  Take 1 tablet by mouth once daily What changed: when to take this   gabapentin  100 MG capsule Commonly known as: NEURONTIN  Take 200 mg by mouth at bedtime.   losartan  25 MG tablet Commonly known as: COZAAR  Take 0.5 tablets (12.5 mg total) by mouth at bedtime.   meclizine  12.5 MG tablet Commonly known  as: ANTIVERT  Take 1-2 tablets (12.5-25 mg total) by mouth 3 (three) times daily as needed for dizziness.   oxyCODONE  5 MG immediate release tablet Commonly known as: Oxy IR/ROXICODONE  Take 1 tablet (5 mg total) by mouth every 6 (six) hours as needed for moderate pain (pain score 4-6) or severe pain (pain score 7-10).   predniSONE  5 MG tablet Commonly known as: DELTASONE  Take 5-10 mg by mouth daily after supper.   sulfamethoxazole -trimethoprim  800-160 MG tablet Commonly known as: BACTRIM  DS Take 1 tablet by mouth every 12 (twelve) hours for 5 days.   Vitamin D3 50 MCG (2000 UT) Tabs Take 2,000 Units by mouth at bedtime.               Discharge Care Instructions  (From admission, onward)           Start     Ordered   09/29/23 0000  Discharge wound care:       Comments:  Resume home wound care as previously   09/29/23 1102            Follow-up Information     [facility], [facility] Follow up.   Specialty: Home Health Services Why: Once you are home from the hospital, someone will contact you to resume your [facility] RN services. Contact information: [address] [place] [address] [phone]                Allergies  Allergen Reactions   Abatacept Anaphylaxis and Other (See Comments)    Orencia: Anaphylaxis-Streptomycin Clickject    Bee Venom Anaphylaxis   Cefadroxil  Diarrhea   Augmentin  [Amoxicillin -Pot Clavulanate] Hives, Itching and Other (See Comments)    Did PCN reaction causing immediate rash, facial/tongue/throat swelling, SOB or lightheadedness with hypotension: yes Did PCN reaction causing severe rash involving mucus membranes or skin necrosis: no Has patient had a PCN reaction that required hospitalization: in hospital Has patient had a PCN reaction occurring within the last 10 years: no If all of the above answers are NO, then may proceed with Cephalosporin use.  Pt reports no recollection of any reactions when taking penicillin in past   Latex Hives and Other (See Comments)    Local reaction (welts). Patient denies any wheezing or other reaction with latex exposure.   Keflex  [Cephalexin ] Diarrhea, Nausea Only and Other (See Comments)    Causes extreme sleepiness, nausea, and diarrhea   Nulytely  [Peg 3350 -Kcl-Na Bicarb-Nacl] Nausea And Vomiting and Other (See Comments)    NAUSEA AND VOMITING. MAY TOLERATE LOW VOLUME PREP.   Tomato Other (See Comments)    Caused sores in the mouth    Consultations:   Procedures:   Discharge Exam: BP (!) 145/75 (BP Location: Right Arm)   Pulse 82   Temp 98.3 F (36.8 C) (Oral)   Resp 17   Ht 6' (1.829 m)   Wt (!) 171 kg   LMP 01/31/2011   SpO2 95%   BMI 51.13 kg/m  Physical Exam Constitutional:      Appearance: Normal appearance.  HENT:     Head:  Normocephalic and atraumatic.     Mouth/Throat:     Mouth: Mucous membranes are moist.  Eyes:     Extraocular Movements: Extraocular movements intact.  Cardiovascular:     Rate and Rhythm: Normal rate and regular rhythm.  Pulmonary:     Effort: Pulmonary effort is normal. No respiratory distress.     Breath sounds: Normal breath sounds. No wheezing.  Abdominal:     General: Bowel sounds are normal. There is no distension.     Palpations: Abdomen is soft.     Tenderness: There is no abdominal tenderness.  Musculoskeletal:     Cervical back: Normal range of motion and neck supple.     Comments: Severe bilateral lower extremity lymphedema with elephantiasis; left lower extremity larger compared to right  Skin:    General: Skin is warm and dry.  Neurological:     General: No focal deficit present.     Mental Status: She is alert.  Psychiatric:        Mood and Affect: Mood normal.      The results of significant diagnostics from this hospitalization (including imaging, microbiology, ancillary and laboratory) are listed below for reference.   Microbiology: Recent Results (from the past 240 hours)  Culture, blood (single)     Status: None (Preliminary result)   Collection Time: 09/25/23  3:10 PM   Specimen: BLOOD RIGHT HAND  Result Value Ref Range Status   Specimen Description   Final    BLOOD RIGHT HAND Performed at [facility], [address], [place], [place] [address]    Special Requests   Final    BOTTLES DRAWN AEROBIC AND ANAEROBIC Blood Culture adequate volume Performed at [facility], [address] [place], [place] [address]    Culture   Final    NO GROWTH 4 DAYS Performed at [facility], [address], [place], [place] [address]    Report Status PENDING  Incomplete     Labs: BNP (last 3 results) No results for input(s): BNP in the last 8760 hours. Basic Metabolic Panel: Recent Labs  Lab 09/25/23 1510 09/26/23 0443  09/27/23 0509 09/28/23 0438  NA 138 135 137 137  K 3.6 3.8 3.8 3.9  CL 103 105 104 107  CO2 26 25 25 26   GLUCOSE 133* 134* 154* 149*  BUN 10 8 7 8   CREATININE 1.07* 0.94 0.71 0.89  CALCIUM  9.1 8.1* 8.5* 8.6*  MG  --   --  2.0 2.1   Liver Function Tests: Recent Labs  Lab 09/25/23 1510  AST 20  ALT 13  ALKPHOS 42  BILITOT 1.9*  PROT 8.2*  ALBUMIN 2.7*   No results for input(s): LIPASE, AMYLASE in the last 168 hours. No results for input(s): AMMONIA in the last 168 hours. CBC: Recent Labs  Lab 09/25/23 1510 09/26/23 0443 09/27/23 0509 09/28/23 0438  WBC 15.4* 13.2* 11.5* 10.0  NEUTROABS 12.2*  --  9.0* 7.7  HGB 9.3* 7.4* 8.2* 7.9*  HCT 30.1* 25.1* 28.5* 27.2*  MCV 90.9 91.6 93.4 95.1  PLT 430* 378 405* 398   Cardiac Enzymes: No results for input(s): CKTOTAL, CKMB, CKMBINDEX, TROPONINI in the last 168 hours. BNP: Invalid input(s): POCBNP CBG: No results for input(s): GLUCAP in the last 168 hours. D-Dimer No results for input(s): DDIMER in the last 72 hours. Hgb A1c No results for input(s): HGBA1C in the last 72 hours. Lipid Profile No results for input(s): CHOL, HDL, LDLCALC, TRIG, CHOLHDL, LDLDIRECT in the last 72 hours. Thyroid  function studies No results for input(s): TSH, T4TOTAL, T3FREE, THYROIDAB in the last 72 hours.  Invalid input(s): FREET3 Anemia work up No results for input(s): VITAMINB12, FOLATE, FERRITIN, TIBC, IRON , RETICCTPCT in the last 72 hours. Urinalysis    Component Value Date/Time   COLORURINE COLORLESS (A) 04/08/2021 0453   APPEARANCEUR CLEAR 04/08/2021 0453   LABSPEC  1.006 04/08/2021 0453   PHURINE 8.0 04/08/2021 0453   GLUCOSEU NEGATIVE 04/08/2021 0453   HGBUR SMALL (A) 04/08/2021 0453   BILIRUBINUR NEGATIVE 04/08/2021 0453   BILIRUBINUR negative 01/16/2020 1735   BILIRUBINUR small 03/09/2017 1230   KETONESUR NEGATIVE 04/08/2021 0453   PROTEINUR NEGATIVE 04/08/2021 0453    UROBILINOGEN 0.2 01/16/2020 1735   UROBILINOGEN 1.0 08/30/2010 1456   NITRITE NEGATIVE 04/08/2021 0453   LEUKOCYTESUR NEGATIVE 04/08/2021 0453   Sepsis Labs Recent Labs  Lab 09/25/23 1510 09/26/23 0443 09/27/23 0509 09/28/23 0438  WBC 15.4* 13.2* 11.5* 10.0   Microbiology Recent Results (from the past 240 hours)  Culture, blood (single)     Status: None (Preliminary result)   Collection Time: 09/25/23  3:10 PM   Specimen: BLOOD RIGHT HAND  Result Value Ref Range Status   Specimen Description   Final    BLOOD RIGHT HAND Performed at [facility], [address], [place], [place] [address]    Special Requests   Final    BOTTLES DRAWN AEROBIC AND ANAEROBIC Blood Culture adequate volume Performed at [facility], [address], [place], [place] [address]    Culture   Final    NO GROWTH 4 DAYS Performed at [facility], [address] [place], [place] [address]    Report Status PENDING  Incomplete    Procedures/Studies: VAS US  LOWER EXTREMITY VENOUS (DVT) (7a-7p) Result Date: 09/26/2023  Lower Venous DVT Study Patient Name:  NEELEY SEDIVY  Date of Exam:   09/25/2023 Medical Rec #: [id number]          Accession #:    [id number] Date of Birth: 1965-07-11         Patient Gender: F Patient Age:   62 years Exam Location:  [facility] Procedure:      VAS US  LOWER EXTREMITY VENOUS (DVT) Referring Phys: FONDA GEIPLE --------------------------------------------------------------------------------  Indications: Pain.  Limitations: Body habitus and poor ultrasound/tissue interface. Performing Technologist: Elmarie Lindau, RVT  Examination Guidelines: A complete evaluation includes B-mode imaging, spectral Doppler, color Doppler, and power Doppler as needed of all accessible portions of each vessel. Bilateral testing is considered an integral part of a complete examination. Limited examinations for reoccurring indications may be performed as noted.  The reflux portion of the exam is performed with the patient in reverse Trendelenburg.  +---------+---------------+---------+-----------+----------+-------------------+ LEFT     CompressibilityPhasicitySpontaneityPropertiesThrombus Aging      +---------+---------------+---------+-----------+----------+-------------------+ CFV      Full           Yes      Yes                                      +---------+---------------+---------+-----------+----------+-------------------+ SFJ      Full                                                             +---------+---------------+---------+-----------+----------+-------------------+ FV Prox  Full                                                             +---------+---------------+---------+-----------+----------+-------------------+  FV Mid   Full                                                             +---------+---------------+---------+-----------+----------+-------------------+ FV Distal                                             Not well visualized +---------+---------------+---------+-----------+----------+-------------------+ PFV      Full                                                             +---------+---------------+---------+-----------+----------+-------------------+ POP                                                   Not well visualized +---------+---------------+---------+-----------+----------+-------------------+ PTV                                                   Not well visualized +---------+---------------+---------+-----------+----------+-------------------+ PERO                                                  Not well visualized +---------+---------------+---------+-----------+----------+-------------------+     Summary: LEFT: - There is no evidence of deep vein thrombosis in the lower extremity. However, portions of this examination were limited- see technologist  comments above.  *See table(s) above for measurements and observations. Electronically signed by Norman Serve on 09/26/2023 at 10:49:02 AM.    Final    VAS US  LOWER EXTREMITY VENOUS (DVT) (7a-7p) Result Date: 09/18/2023  Lower Venous DVT Study Patient Name:  MAYTHE DERAMO  Date of Exam:   09/18/2023 Medical Rec #: [id number]          Accession #:    [id number] Date of Birth: 14-Sep-1965         Patient Gender: F Patient Age:   75 years Exam Location:  [facility] Procedure:      VAS US  LOWER EXTREMITY VENOUS (DVT) Referring Phys: LAURAINE SMOOT --------------------------------------------------------------------------------  Indications: Pain.  Risk Factors: None identified. Anticoagulation: Eliquis . Limitations: Body habitus, poor ultrasound/tissue interface, patient positioning, bandages and open wound. Comparison Study: Remote history of right sided DVT. Performing Technologist: Cordella Collet RVT  Examination Guidelines: A complete evaluation includes B-mode imaging, spectral Doppler, color Doppler, and power Doppler as needed of all accessible portions of each vessel. Bilateral testing is considered an integral part of a complete examination. Limited examinations for reoccurring indications may be performed as noted. The reflux portion of the exam is performed with the patient in reverse Trendelenburg.  +-----+---------------+---------+-----------+----------+--------------+ RIGHTCompressibilityPhasicitySpontaneityPropertiesThrombus Aging +-----+---------------+---------+-----------+----------+--------------+ CFV  Full  Yes      Yes                                 +-----+---------------+---------+-----------+----------+--------------+   +---------+---------------+---------+-----------+----------+-------------------+ LEFT     CompressibilityPhasicitySpontaneityPropertiesThrombus Aging      +---------+---------------+---------+-----------+----------+-------------------+  CFV      Full           Yes      Yes                                      +---------+---------------+---------+-----------+----------+-------------------+ SFJ      Full                                                             +---------+---------------+---------+-----------+----------+-------------------+ FV Prox  Full                                                             +---------+---------------+---------+-----------+----------+-------------------+ FV Mid   Full                                                             +---------+---------------+---------+-----------+----------+-------------------+ FV Distal               Yes      Yes                                      +---------+---------------+---------+-----------+----------+-------------------+ PFV                                                   Not well visualized +---------+---------------+---------+-----------+----------+-------------------+ POP      Full           Yes      Yes                                      +---------+---------------+---------+-----------+----------+-------------------+ PTV                                                   Not well visualized +---------+---------------+---------+-----------+----------+-------------------+ PERO  Not well visualized +---------+---------------+---------+-----------+----------+-------------------+     Summary: RIGHT: - No evidence of common femoral vein obstruction.   LEFT: - There is no evidence of deep vein thrombosis in the lower extremity. However, portions of this examination were limited- see technologist comments above.  - No cystic structure found in the popliteal fossa.  *See table(s) above for measurements and observations. Electronically signed by Norman Serve on 09/18/2023 at 3:26:20 PM.    Final      Time coordinating discharge: Over 30 minutes    Alm Apo, MD  [facility] 09/29/2023, 3:06 PM

## 2023-09-29 NOTE — TOC Transition Note (Signed)
 Transition of Care [facility]) - Discharge Note   Patient Details  Name: [name] MRN: [id number] Date of Birth: [birth year]  Transition of Care [facility]) CM/SW Contact:  Jon ONEIDA Anon, RN Phone Number: 09/29/2023, 11:33 AM   Clinical Narrative:    Pt will discharge home with home health through [facility] for [facility] RN needs. Pt agreeable to DC plan. HH RN orders are in place. Pt family to provide transportation. There are no other TOC needs at this time.  Final next level of care: Home w Home Health Services Barriers to Discharge: No Barriers Identified   Patient Goals and CMS Choice Patient states their goals for this hospitalization and ongoing recovery are:: To return home with home health CMS Medicare.gov Compare Post Acute Care list provided to:: Other (Comment Required) (NA) Choice offered to / list presented to : NA [place] ownership interest in [facility].provided to:: Parent NA    Discharge Placement                       Discharge Plan and Services Additional resources added to the After Visit Summary for   In-house Referral: Clinical Social Work   Post Acute Care Choice: Home Health          DME Arranged: N/A DME Agency: NA       HH Arranged: RN HH Agency: [facility] Date HH Agency Contacted: 09/29/23 Time HH Agency Contacted: 1132 Representative spoke with at [facility] Agency: [place] [phone]  Social Drivers of Health (SDOH) Interventions SDOH Screenings   Food Insecurity: No Food Insecurity (09/25/2023)  Housing: High Risk (09/25/2023)  Transportation Needs: No Transportation Needs (09/25/2023)  Utilities: At Risk (09/25/2023)  Alcohol Screen: Low Risk  (10/30/2020)  Depression (PHQ2-9): High Risk (09/14/2023)  Financial Resource Strain: Low Risk  (05/14/2023)  Physical Activity: Insufficiently Active (12/06/2022)  Social Connections: Moderately Integrated (12/06/2022)  Stress: No Stress Concern Present (12/06/2022)  Tobacco Use: Low  Risk  (09/25/2023)  Health Literacy: Adequate Health Literacy (12/06/2022)     Readmission Risk Interventions     No data to display

## 2023-09-30 LAB — CULTURE, BLOOD (SINGLE)
Culture: NO GROWTH
Special Requests: ADEQUATE

## 2023-10-01 ENCOUNTER — Telehealth: Payer: Self-pay | Admitting: *Deleted

## 2023-10-01 DIAGNOSIS — L97228 Non-pressure chronic ulcer of left calf with other specified severity: Secondary | ICD-10-CM | POA: Diagnosis not present

## 2023-10-01 DIAGNOSIS — I10 Essential (primary) hypertension: Secondary | ICD-10-CM | POA: Diagnosis not present

## 2023-10-01 DIAGNOSIS — J45909 Unspecified asthma, uncomplicated: Secondary | ICD-10-CM | POA: Diagnosis not present

## 2023-10-01 DIAGNOSIS — I890 Lymphedema, not elsewhere classified: Secondary | ICD-10-CM | POA: Diagnosis not present

## 2023-10-01 DIAGNOSIS — Z86711 Personal history of pulmonary embolism: Secondary | ICD-10-CM | POA: Diagnosis not present

## 2023-10-01 DIAGNOSIS — Z7901 Long term (current) use of anticoagulants: Secondary | ICD-10-CM | POA: Diagnosis not present

## 2023-10-01 DIAGNOSIS — M1990 Unspecified osteoarthritis, unspecified site: Secondary | ICD-10-CM | POA: Diagnosis not present

## 2023-10-01 DIAGNOSIS — E785 Hyperlipidemia, unspecified: Secondary | ICD-10-CM | POA: Diagnosis not present

## 2023-10-01 DIAGNOSIS — F32A Depression, unspecified: Secondary | ICD-10-CM | POA: Diagnosis not present

## 2023-10-01 DIAGNOSIS — D649 Anemia, unspecified: Secondary | ICD-10-CM | POA: Diagnosis not present

## 2023-10-01 DIAGNOSIS — F419 Anxiety disorder, unspecified: Secondary | ICD-10-CM | POA: Diagnosis not present

## 2023-10-01 DIAGNOSIS — R7303 Prediabetes: Secondary | ICD-10-CM | POA: Diagnosis not present

## 2023-10-01 DIAGNOSIS — I872 Venous insufficiency (chronic) (peripheral): Secondary | ICD-10-CM | POA: Diagnosis not present

## 2023-10-01 NOTE — Transitions of Care (Post Inpatient/ED Visit) (Signed)
 10/01/2023  Name: [name] MRN: [id number] DOB: [birth year]  Today's TOC FU Call Status: Today's TOC FU Call Status:: Successful TOC FU Call Completed TOC FU Call Complete Date: 10/01/23 Patient's Name and Date of Birth confirmed.  Transition Care Management Follow-up Telephone Call Date of Discharge: 09/29/23 Discharge Facility: [facility]) Type of Discharge: Inpatient Admission Primary Inpatient Discharge Diagnosis:: Sepsis due to cellulitis How have you been since you were released from the hospital?: Better Any questions or concerns?: No  Items Reviewed: Did you receive and understand the discharge instructions provided?: Yes Medications obtained,verified, and reconciled?: Yes (Medications Reviewed) Any new allergies since your discharge?: No Dietary orders reviewed?: Yes Type of Diet Ordered:: general Do you have support at home?: Yes People in Home [RPT]: alone Name of Support/Comfort Primary Source: Daughter lives close by and is supportive  Medications Reviewed Today: Medications Reviewed Today     Reviewed by Lucky Andrea LABOR, RN (Registered Nurse) on 10/01/23 at 1305  Med List Status: <None>   Medication Order Taking? Sig Documenting Provider Last Dose Status Informant  acetaminophen  (TYLENOL ) 325 MG tablet [id number] Yes Take 1 tablet (325 mg total) by mouth every 6 (six) hours as needed for mild pain (pain score 1-3), fever or headache (or Fever >/= 101). Lue Elsie BROCKS, MD  Active Self  albuterol  (VENTOLIN  HFA) 108 [phone] Base) MCG/ACT inhaler [id number] Yes Inhale 2 puffs into the lungs every 6 (six) hours as needed for wheezing or shortness of breath. Theotis Haze ORN, NP  Active Self  apixaban  (ELIQUIS ) 5 MG TABS tablet [id number] Yes Take 1 tablet (5 mg total) by mouth 2 (two) times daily. Theotis Haze ORN, NP  Active Self  buPROPion  (WELLBUTRIN  XL) 150 MG 24 hr tablet [id number] Yes Take 1 tablet (150 mg total) by mouth daily. Theotis Haze ORN, NP   Active Self  Cholecalciferol (VITAMIN D3) 50 MCG (2000 UT) TABS [id number] Yes Take 2,000 Units by mouth at bedtime. [provider]  Active Self  CIMZIA, 2 SYRINGE, 200 MG/ML prefilled syringe [id number]  Inject 200 mg into the skin every 14 (fourteen) days.  Patient not taking: Reported on 10/01/2023   [provider]  Active Self           Med Note MARISA, NATHANEL SAILOR   Tue Sep 25, 2023  9:54 PM)    escitalopram  (LEXAPRO ) 10 MG tablet [id number] Yes Take 1 tablet (10 mg total) by mouth daily. For depression Fleming, [name] W, NP  Active Self  ezetimibe  (ZETIA ) 10 MG tablet [id number] Yes Take 1 tablet (10 mg total) by mouth daily. For high cholesterol Fleming, [name] W, NP  Active Self  famotidine  (PEPCID ) 40 MG tablet [id number] Yes Take 1 tablet by mouth once daily Fleming, [name] W, NP  Active Self  folic acid  (FOLVITE ) 1 MG tablet [id number] Yes Take 1 tablet by mouth once daily Newlin, Enobong, MD  Active Self  gabapentin  (NEURONTIN ) 100 MG capsule [id number] Yes Take 200 mg by mouth at bedtime. [provider]  Active Self  losartan  (COZAAR ) 25 MG tablet [id number] Yes Take 0.5 tablets (12.5 mg total) by mouth at bedtime. Thukkani, Arun K, MD  Active Self           Med Note MARISA, NATHANEL SAILOR   Tue Sep 25, 2023  9:48 PM)    meclizine  (ANTIVERT ) 12.5 MG tablet [id number] Yes Take 1-2 tablets (12.5-25 mg total) by mouth 3 (three) times daily as needed for dizziness. Fleming, [name]  W, NP  Active Self  Misc. Devices (BARIATRIC ROLLATOR) MISC [id number] Yes Please provide patient with insurance approved bariatric rollator walker. I89.0, R53.81, R26.81 Theotis Haze ORN, NP  Active   oxyCODONE  (OXY IR/ROXICODONE ) 5 MG immediate release tablet [id number] Yes Take 1 tablet (5 mg total) by mouth every 6 (six) hours as needed for moderate pain (pain score 4-6) or severe pain (pain score 7-10). Patsy Lenis, MD  Active   predniSONE  (DELTASONE ) 5 MG tablet [id number] Yes Take 5-10 mg by mouth daily  after supper. [provider]  Active Self  sulfamethoxazole -trimethoprim  (BACTRIM  DS) 800-160 MG tablet [id number] Yes Take 1 tablet by mouth every 12 (twelve) hours for 5 days. Patsy Lenis, MD  Active             Home Care and Equipment/Supplies: Were Home Health Services Ordered?: Yes Name of Home Health Agency:: [facility] Has Agency set up a time to come to your home?: Yes First Home Health Visit Date: 10/01/23 Any new equipment or medical supplies ordered?: No  Functional Questionnaire: Do you need assistance with bathing/showering or dressing?: Yes (Unable to get in and out of her tub, patient bathes in sink) Do you need assistance with meal preparation?: No Do you need assistance with eating?: No Do you have difficulty maintaining continence: No Do you need assistance with getting out of bed/getting out of a chair/moving?: Yes (ambulates with rollator) Do you have difficulty managing or taking your medications?: No  Follow up appointments reviewed: PCP Follow-up appointment confirmed?: No (Patient will call to schedule) MD Provider Line Number:[phone] Given: No Specialist Hospital Follow-up appointment confirmed?: No Reason Specialist Follow-Up Not Confirmed: Patient has Specialist Provider Number and will Call for Appointment Do you need transportation to your follow-up appointment?: No Do you understand care options if your condition(s) worsen?: Yes-patient verbalized understanding  SDOH Interventions Today    Flowsheet Row Most Recent Value  SDOH Interventions   Food Insecurity Interventions Other (Comment)  [Patient is actively working with BSW]  Housing Interventions Intervention Not Indicated  Transportation Interventions Intervention Not Indicated  Utilities Interventions Intervention Not Indicated    Andrea Dimes RN, BSN [place]  [facility] RN Care Manager [phone]

## 2023-10-03 ENCOUNTER — Other Ambulatory Visit: Payer: Self-pay

## 2023-10-03 NOTE — Patient Instructions (Signed)

## 2023-10-03 NOTE — Patient Outreach (Signed)
 Complex Care Management   Visit Note  10/03/2023  Name:  [name] MRN: [id number] DOB: 1965/08/05  Situation: Referral received for Complex Care Management related to SDOH Barriers:  Housing rental assistance Food insecurity I obtained verbal consent from Patient.  Visit completed with patient  on the phone  Background:   Past Medical History:  Diagnosis Date   Anemia    Anxiety    Arthritis    Asthma    hx as child - no prob as adult - no inhaler   Blood transfusion 01/22/11   transfusion 2 units at [facility]   Depression 08/2010   psych assessment   Dyspnea    Fibroid    Headache(784.0)    rx for imitrex - last one jan   Keloid    Lymphedema    Pulmonary embolism (HCC)     Assessment: BSW held f/u appt with patient. Patient was alert and cognitive. Patient was recently hospitalized and confirmed she is feeling a litter better. Patient also confirmed she began receiving SSDI benefit again after bank account being hacked. Patient was able to pay July 2025 rent and avoid eviction paperwork. Patient states her sister and mother are very involved and supportive. Pt sister is helping coordinate food for patient so that she does not have to stand up for long times to cook. BSW provided patient application portal link for [place] (life sciences) and reminded patient she can apply online or call by phone on 08/11 at [facility]. Patient understood and agreed. Patient was also provided mobile food market list for out-of-the-garden food project and contact info for [employer] home food delivery for referral f/u. Patient agreed to case closure and was instructed on how to get connected to BSW services in the future. No other resources were provided/requested at this time.   SDOH Interventions    Flowsheet Row Patient Outreach from 10/03/2023 in [place] [facility] DEPARTMENT Telephone from 10/01/2023 in [place] POPULATION [facility] DEPARTMENT ED to Hosp-Admission  (Discharged) from 09/25/2023 in [facility] [place] [facility] ED to Hosp-Admission (Discharged) from 09/18/2023 in [place] [facility] Patient Outreach Telephone from 09/04/2023 in [place] POPULATION HEALTH DEPARTMENT Patient Outreach Telephone from 08/03/2023 in [place] POPULATION HEALTH DEPARTMENT  SDOH Interventions        Food Insecurity Interventions Community Resources Provided  [place] is coordinating with sister to have foods that she does not have to stand up very long for to cook.] Other (Comment)  [Patient is actively working with BSW] -- Intervention Not Indicated, Inpatient [facility] Intervention Not Indicated Intervention Not Indicated  Housing Interventions Community Resources Provided  [place] was referred to [place] financial assistance ministry] Intervention Not Indicated Intervention Not Indicated, Inpatient TOC Intervention Not Indicated, Inpatient TOC AMB Referral  [BSW] Intervention Not Indicated  Transportation Interventions Intervention Not Indicated Intervention Not Indicated -- -- -- --  Utilities Interventions Intervention Not Indicated Intervention Not Indicated Intervention Not Indicated, Inpatient TOC -- Intervention Not Indicated Intervention Not Indicated  Depression Interventions/Treatment  -- -- -- -- Medication Medication      Recommendation:   none  Follow Up Plan:   Patient has met all care management goals. Care Management case will be closed. Patient has been provided contact information should new needs arise.   Laymon Doll, BSW [place]/[facility]I - [employer] Social Worker [phone]

## 2023-10-04 DIAGNOSIS — L97228 Non-pressure chronic ulcer of left calf with other specified severity: Secondary | ICD-10-CM | POA: Diagnosis not present

## 2023-10-04 DIAGNOSIS — F32A Depression, unspecified: Secondary | ICD-10-CM | POA: Diagnosis not present

## 2023-10-04 DIAGNOSIS — I872 Venous insufficiency (chronic) (peripheral): Secondary | ICD-10-CM | POA: Diagnosis not present

## 2023-10-04 DIAGNOSIS — Z86711 Personal history of pulmonary embolism: Secondary | ICD-10-CM | POA: Diagnosis not present

## 2023-10-04 DIAGNOSIS — I10 Essential (primary) hypertension: Secondary | ICD-10-CM | POA: Diagnosis not present

## 2023-10-04 DIAGNOSIS — J45909 Unspecified asthma, uncomplicated: Secondary | ICD-10-CM | POA: Diagnosis not present

## 2023-10-04 DIAGNOSIS — D649 Anemia, unspecified: Secondary | ICD-10-CM | POA: Diagnosis not present

## 2023-10-04 DIAGNOSIS — M1990 Unspecified osteoarthritis, unspecified site: Secondary | ICD-10-CM | POA: Diagnosis not present

## 2023-10-04 DIAGNOSIS — F419 Anxiety disorder, unspecified: Secondary | ICD-10-CM | POA: Diagnosis not present

## 2023-10-04 DIAGNOSIS — E785 Hyperlipidemia, unspecified: Secondary | ICD-10-CM | POA: Diagnosis not present

## 2023-10-04 DIAGNOSIS — Z7901 Long term (current) use of anticoagulants: Secondary | ICD-10-CM | POA: Diagnosis not present

## 2023-10-04 DIAGNOSIS — I890 Lymphedema, not elsewhere classified: Secondary | ICD-10-CM | POA: Diagnosis not present

## 2023-10-04 DIAGNOSIS — R7303 Prediabetes: Secondary | ICD-10-CM | POA: Diagnosis not present

## 2023-10-06 ENCOUNTER — Ambulatory Visit: Payer: Self-pay | Admitting: Nurse Practitioner

## 2023-10-08 ENCOUNTER — Telehealth: Payer: Self-pay

## 2023-10-08 NOTE — Patient Outreach (Signed)
 Care Coordination   10/08/2023 Name: AZLEE MONFORTE MRN: [id number] DOB: [birth year]   Care Coordination Outreach Attempts:  An unsuccessful outreach was attempted for an appointment today.  Follow Up Plan:  Additional outreach attempts will be made to complete CCM follow-up visit.  Encounter Outcome:  No Answer. HIPAA compliant voicemail left requesting call back.   Rosaline Finlay, RN MSN [place]  [facility] Population Health RN Care Manager Direct Dial: [phone]  Fax: [phone]

## 2023-10-09 DIAGNOSIS — M1990 Unspecified osteoarthritis, unspecified site: Secondary | ICD-10-CM | POA: Diagnosis not present

## 2023-10-09 DIAGNOSIS — I890 Lymphedema, not elsewhere classified: Secondary | ICD-10-CM | POA: Diagnosis not present

## 2023-10-09 DIAGNOSIS — I872 Venous insufficiency (chronic) (peripheral): Secondary | ICD-10-CM | POA: Diagnosis not present

## 2023-10-09 DIAGNOSIS — F32A Depression, unspecified: Secondary | ICD-10-CM | POA: Diagnosis not present

## 2023-10-09 DIAGNOSIS — I10 Essential (primary) hypertension: Secondary | ICD-10-CM | POA: Diagnosis not present

## 2023-10-09 DIAGNOSIS — J45909 Unspecified asthma, uncomplicated: Secondary | ICD-10-CM | POA: Diagnosis not present

## 2023-10-09 DIAGNOSIS — R7303 Prediabetes: Secondary | ICD-10-CM | POA: Diagnosis not present

## 2023-10-09 DIAGNOSIS — L97228 Non-pressure chronic ulcer of left calf with other specified severity: Secondary | ICD-10-CM | POA: Diagnosis not present

## 2023-10-09 DIAGNOSIS — F419 Anxiety disorder, unspecified: Secondary | ICD-10-CM | POA: Diagnosis not present

## 2023-10-09 DIAGNOSIS — Z86711 Personal history of pulmonary embolism: Secondary | ICD-10-CM | POA: Diagnosis not present

## 2023-10-09 DIAGNOSIS — E785 Hyperlipidemia, unspecified: Secondary | ICD-10-CM | POA: Diagnosis not present

## 2023-10-09 DIAGNOSIS — D649 Anemia, unspecified: Secondary | ICD-10-CM | POA: Diagnosis not present

## 2023-10-09 DIAGNOSIS — Z7901 Long term (current) use of anticoagulants: Secondary | ICD-10-CM | POA: Diagnosis not present

## 2023-10-10 ENCOUNTER — Other Ambulatory Visit: Payer: Self-pay

## 2023-10-10 NOTE — Patient Outreach (Signed)
 Complex Care Management   Visit Note  10/10/2023  Name:  [name] MRN: [id number] DOB: 1965/06/06  Situation: Referral received for Complex Care Management related to lymphedema I obtained verbal consent from Patient.  Visit completed with Suzen Lukes  on the phone  Background:   Past Medical History:  Diagnosis Date   Anemia    Anxiety    Arthritis    Asthma    hx as child - no prob as adult - no inhaler   Blood transfusion 01/22/11   transfusion 2 units at [facility]   Depression 08/2010   psych assessment   Dyspnea    Fibroid    Headache(784.0)    rx for imitrex - last one jan   Keloid    Lymphedema    Pulmonary embolism (HCC)     Assessment: Patient Reported Symptoms:  Cognitive Cognitive Status: Able to follow simple commands, Alert and oriented to person, place, and time, Normal speech and language skills Cognitive/Intellectual Conditions Management [RPT]: None reported or documented in medical history or problem list      Neurological Neurological Review of Symptoms: No symptoms reported    HEENT HEENT Symptoms Reported: Not assessed      Cardiovascular Cardiovascular Symptoms Reported: Swelling in legs or feet (Chronic LE swelling due to lymphedema, L>R) Does patient have uncontrolled Hypertension?: No Cardiovascular Management Strategies: Medication therapy  Respiratory Respiratory Symptoms Reported: No symptoms reported    Endocrine Endocrine Symptoms Reported: Not assessed Is patient diabetic?: No    Gastrointestinal Gastrointestinal Symptoms Reported: Change in appetite Additional Gastrointestinal Details: Patient reports BMs were loose while taking abx. Notes that she did have C Diff in September 2024. She states that BMs are back to being normal now. Patient reports her appetite has decreased with cellulitis. She has not lost any weight that she is aware of.      Genitourinary Genitourinary Symptoms Reported: Incontinence (at night per  patient) Genitourinary Management Strategies: Incontinence garment/pad  Integumentary Integumentary Symptoms Reported: Wound Additional Integumentary Details: Patient reports wounds LLE where blisters were during hospitalization. She denies drainage from wounds. Dressing overtop, being changed every day. Patient reports [facility] RN comes twice a week to change dressing, and on other days she is able to do it herself. Patient denies fever, increased swelling or redness to lower extremities. She has completed abx. Skin Management Strategies: Dressing changes, Routine screening Skin Comment: Patient reports she will be going back to the Lymphedema clinic in [facility]. She has not yet made follow-up appointment. Encouraged to do so.  Musculoskeletal Musculoskelatal Symptoms Reviewed: Unsteady gait Additional Musculoskeletal Details: Patient reports strength has improved since hospital d/c. Musculoskeletal Management Strategies: Medical device Frieda) Falls in the past year?: Yes Number of falls in past year: 2 or more (Most recent fall iln May) Was there an injury with Fall?: No Fall Risk Category Calculator: 2 Patient Fall Risk Level: Moderate Fall Risk Patient at Risk for Falls Due to: Impaired balance/gait Fall risk Follow up: Falls evaluation completed, Education provided, Falls prevention discussed  Psychosocial Psychosocial Symptoms Reported: Not assessed            09/14/2023   11:23 AM  Depression screen PHQ 2/9  Decreased Interest 2  Down, Depressed, Hopeless 2  PHQ - 2 Score 4  Altered sleeping 1  Tired, decreased energy 1  Change in appetite 2  Feeling bad or failure about yourself  2  Trouble concentrating 1  Moving slowly or fidgety/restless 0  Suicidal thoughts  1  PHQ-9 Score 12  Difficult doing work/chores Somewhat difficult    There were no vitals filed for this visit.  Medications Reviewed Today     Reviewed by Arno Rosaline SQUIBB, RN (Registered Nurse) on 10/10/23  at 1325  Med List Status: <None>   Medication Order Taking? Sig Documenting Provider Last Dose Status Informant  acetaminophen  (TYLENOL ) 325 MG tablet [id number]  Take 1 tablet (325 mg total) by mouth every 6 (six) hours as needed for mild pain (pain score 1-3), fever or headache (or Fever >/= 101). Lue Elsie BROCKS, MD  Active Self  albuterol  (VENTOLIN  HFA) 108 [phone] Base) MCG/ACT inhaler [id number]  Inhale 2 puffs into the lungs every 6 (six) hours as needed for wheezing or shortness of breath. Theotis Haze ORN, NP  Active Self  apixaban  (ELIQUIS ) 5 MG TABS tablet [id number]  Take 1 tablet (5 mg total) by mouth 2 (two) times daily. Theotis Haze ORN, NP  Active Self  buPROPion  (WELLBUTRIN  XL) 150 MG 24 hr tablet [id number]  Take 1 tablet (150 mg total) by mouth daily. Theotis Haze ORN, NP  Active Self  Cholecalciferol (VITAMIN D3) 50 MCG (2000 UT) TABS [id number]  Take 2,000 Units by mouth at bedtime. [provider]  Active Self  CIMZIA, 2 SYRINGE, 200 MG/ML prefilled syringe [id number]  Inject 200 mg into the skin every 14 (fourteen) days.  Patient not taking: Reported on 10/01/2023   [provider]  Active Self           Med Note MARISA, NATHANEL SAILOR   Tue Sep 25, 2023  9:54 PM)    escitalopram  (LEXAPRO ) 10 MG tablet [id number]  Take 1 tablet (10 mg total) by mouth daily. For depression Theotis Haze ORN, NP  Active Self  ezetimibe  (ZETIA ) 10 MG tablet [id number]  Take 1 tablet (10 mg total) by mouth daily. For high cholesterol Theotis Haze ORN, NP  Active Self  famotidine  (PEPCID ) 40 MG tablet [id number]  Take 1 tablet by mouth once daily Fleming, [name] W, NP  Active Self  folic acid  (FOLVITE ) 1 MG tablet [id number]  Take 1 tablet by mouth once daily Newlin, Enobong, MD  Active Self  gabapentin  (NEURONTIN ) 100 MG capsule [id number]  Take 200 mg by mouth at bedtime. [provider]  Active Self  losartan  (COZAAR ) 25 MG tablet [id number]  Take 0.5 tablets (12.5 mg total) by mouth at  bedtime. Thukkani, Arun K, MD  Active Self           Med Note MARISA, NATHANEL SAILOR   Tue Sep 25, 2023  9:48 PM)    meclizine  (ANTIVERT ) 12.5 MG tablet [id number]  Take 1-2 tablets (12.5-25 mg total) by mouth 3 (three) times daily as needed for dizziness. Theotis Haze ORN, NP  Active Self  Misc. Devices (BARIATRIC ROLLATOR) MISC [id number]  Please provide patient with insurance approved bariatric rollator walker. I89.0, R53.81, R26.81 Theotis Haze ORN, NP  Active   oxyCODONE  (OXY IR/ROXICODONE ) 5 MG immediate release tablet [id number]  Take 1 tablet (5 mg total) by mouth every 6 (six) hours as needed for moderate pain (pain score 4-6) or severe pain (pain score 7-10). Patsy Lenis, MD  Active   predniSONE  (DELTASONE ) 5 MG tablet [id number]  Take 5-10 mg by mouth daily after supper. [provider]  Active Self            Recommendation:   Specialty provider follow-up Lymphedema clinic Continue Current Plan of Care  Follow Up Plan:  Telephone follow up appointment date/time:  10/24/23 at 1 PM  Rosaline Finlay, RN MSN [place]  [facility] RN Care Manager Direct Dial: [phone]  Fax: [phone]

## 2023-10-10 NOTE — Patient Instructions (Signed)
 Visit Information  Thank you for taking time to visit with me today. Please don't hesitate to contact me if I can be of assistance to you before our next scheduled appointment.  Your next care management appointment is by telephone on 10/24/23 at 1 PM  Please call the care guide team at [phone] if you need to cancel, schedule, or reschedule an appointment.   Please call the Suicide and Crisis Lifeline: 988 call 1-800-273-TALK (toll free, 24 hour hotline) if you are experiencing a Mental Health or Behavioral Health Crisis or need someone to talk to.  Rosaline Finlay, RN MSN [place]  [facility] Population Health RN Care Manager Direct Dial: [phone]  Fax: [phone]

## 2023-10-11 DIAGNOSIS — E785 Hyperlipidemia, unspecified: Secondary | ICD-10-CM | POA: Diagnosis not present

## 2023-10-11 DIAGNOSIS — I10 Essential (primary) hypertension: Secondary | ICD-10-CM | POA: Diagnosis not present

## 2023-10-11 DIAGNOSIS — J45909 Unspecified asthma, uncomplicated: Secondary | ICD-10-CM | POA: Diagnosis not present

## 2023-10-11 DIAGNOSIS — I872 Venous insufficiency (chronic) (peripheral): Secondary | ICD-10-CM | POA: Diagnosis not present

## 2023-10-11 DIAGNOSIS — I890 Lymphedema, not elsewhere classified: Secondary | ICD-10-CM | POA: Diagnosis not present

## 2023-10-11 DIAGNOSIS — D649 Anemia, unspecified: Secondary | ICD-10-CM | POA: Diagnosis not present

## 2023-10-11 DIAGNOSIS — Z86711 Personal history of pulmonary embolism: Secondary | ICD-10-CM | POA: Diagnosis not present

## 2023-10-11 DIAGNOSIS — L97228 Non-pressure chronic ulcer of left calf with other specified severity: Secondary | ICD-10-CM | POA: Diagnosis not present

## 2023-10-11 DIAGNOSIS — F32A Depression, unspecified: Secondary | ICD-10-CM | POA: Diagnosis not present

## 2023-10-11 DIAGNOSIS — M1990 Unspecified osteoarthritis, unspecified site: Secondary | ICD-10-CM | POA: Diagnosis not present

## 2023-10-11 DIAGNOSIS — F419 Anxiety disorder, unspecified: Secondary | ICD-10-CM | POA: Diagnosis not present

## 2023-10-11 DIAGNOSIS — Z7901 Long term (current) use of anticoagulants: Secondary | ICD-10-CM | POA: Diagnosis not present

## 2023-10-11 DIAGNOSIS — R7303 Prediabetes: Secondary | ICD-10-CM | POA: Diagnosis not present

## 2023-10-16 DIAGNOSIS — Z7952 Long term (current) use of systemic steroids: Secondary | ICD-10-CM | POA: Diagnosis not present

## 2023-10-16 DIAGNOSIS — D75839 Thrombocytosis, unspecified: Secondary | ICD-10-CM | POA: Diagnosis not present

## 2023-10-16 DIAGNOSIS — F419 Anxiety disorder, unspecified: Secondary | ICD-10-CM | POA: Diagnosis not present

## 2023-10-16 DIAGNOSIS — D649 Anemia, unspecified: Secondary | ICD-10-CM | POA: Diagnosis not present

## 2023-10-16 DIAGNOSIS — F32A Depression, unspecified: Secondary | ICD-10-CM | POA: Diagnosis not present

## 2023-10-16 DIAGNOSIS — A419 Sepsis, unspecified organism: Secondary | ICD-10-CM | POA: Diagnosis not present

## 2023-10-16 DIAGNOSIS — J45909 Unspecified asthma, uncomplicated: Secondary | ICD-10-CM | POA: Diagnosis not present

## 2023-10-16 DIAGNOSIS — M069 Rheumatoid arthritis, unspecified: Secondary | ICD-10-CM | POA: Diagnosis not present

## 2023-10-16 DIAGNOSIS — R7303 Prediabetes: Secondary | ICD-10-CM | POA: Diagnosis not present

## 2023-10-16 DIAGNOSIS — Z79891 Long term (current) use of opiate analgesic: Secondary | ICD-10-CM | POA: Diagnosis not present

## 2023-10-16 DIAGNOSIS — Z7901 Long term (current) use of anticoagulants: Secondary | ICD-10-CM | POA: Diagnosis not present

## 2023-10-16 DIAGNOSIS — M1990 Unspecified osteoarthritis, unspecified site: Secondary | ICD-10-CM | POA: Diagnosis not present

## 2023-10-16 DIAGNOSIS — L97228 Non-pressure chronic ulcer of left calf with other specified severity: Secondary | ICD-10-CM | POA: Diagnosis not present

## 2023-10-16 DIAGNOSIS — Z86711 Personal history of pulmonary embolism: Secondary | ICD-10-CM | POA: Diagnosis not present

## 2023-10-16 DIAGNOSIS — G8929 Other chronic pain: Secondary | ICD-10-CM | POA: Diagnosis not present

## 2023-10-16 DIAGNOSIS — I890 Lymphedema, not elsewhere classified: Secondary | ICD-10-CM | POA: Diagnosis not present

## 2023-10-16 DIAGNOSIS — I10 Essential (primary) hypertension: Secondary | ICD-10-CM | POA: Diagnosis not present

## 2023-10-16 DIAGNOSIS — I872 Venous insufficiency (chronic) (peripheral): Secondary | ICD-10-CM | POA: Diagnosis not present

## 2023-10-16 DIAGNOSIS — E785 Hyperlipidemia, unspecified: Secondary | ICD-10-CM | POA: Diagnosis not present

## 2023-10-16 DIAGNOSIS — Z792 Long term (current) use of antibiotics: Secondary | ICD-10-CM | POA: Diagnosis not present

## 2023-10-16 DIAGNOSIS — Z6841 Body Mass Index (BMI) 40.0 and over, adult: Secondary | ICD-10-CM | POA: Diagnosis not present

## 2023-10-16 DIAGNOSIS — L03116 Cellulitis of left lower limb: Secondary | ICD-10-CM | POA: Diagnosis not present

## 2023-10-18 DIAGNOSIS — D649 Anemia, unspecified: Secondary | ICD-10-CM | POA: Diagnosis not present

## 2023-10-18 DIAGNOSIS — G8929 Other chronic pain: Secondary | ICD-10-CM | POA: Diagnosis not present

## 2023-10-18 DIAGNOSIS — M1990 Unspecified osteoarthritis, unspecified site: Secondary | ICD-10-CM | POA: Diagnosis not present

## 2023-10-18 DIAGNOSIS — J45909 Unspecified asthma, uncomplicated: Secondary | ICD-10-CM | POA: Diagnosis not present

## 2023-10-18 DIAGNOSIS — L97228 Non-pressure chronic ulcer of left calf with other specified severity: Secondary | ICD-10-CM | POA: Diagnosis not present

## 2023-10-18 DIAGNOSIS — A419 Sepsis, unspecified organism: Secondary | ICD-10-CM | POA: Diagnosis not present

## 2023-10-18 DIAGNOSIS — Z79891 Long term (current) use of opiate analgesic: Secondary | ICD-10-CM | POA: Diagnosis not present

## 2023-10-18 DIAGNOSIS — Z792 Long term (current) use of antibiotics: Secondary | ICD-10-CM | POA: Diagnosis not present

## 2023-10-18 DIAGNOSIS — L03116 Cellulitis of left lower limb: Secondary | ICD-10-CM | POA: Diagnosis not present

## 2023-10-18 DIAGNOSIS — D75839 Thrombocytosis, unspecified: Secondary | ICD-10-CM | POA: Diagnosis not present

## 2023-10-18 DIAGNOSIS — M069 Rheumatoid arthritis, unspecified: Secondary | ICD-10-CM | POA: Diagnosis not present

## 2023-10-18 DIAGNOSIS — I890 Lymphedema, not elsewhere classified: Secondary | ICD-10-CM | POA: Diagnosis not present

## 2023-10-18 DIAGNOSIS — Z86711 Personal history of pulmonary embolism: Secondary | ICD-10-CM | POA: Diagnosis not present

## 2023-10-18 DIAGNOSIS — Z6841 Body Mass Index (BMI) 40.0 and over, adult: Secondary | ICD-10-CM | POA: Diagnosis not present

## 2023-10-18 DIAGNOSIS — I10 Essential (primary) hypertension: Secondary | ICD-10-CM | POA: Diagnosis not present

## 2023-10-18 DIAGNOSIS — R7303 Prediabetes: Secondary | ICD-10-CM | POA: Diagnosis not present

## 2023-10-18 DIAGNOSIS — I872 Venous insufficiency (chronic) (peripheral): Secondary | ICD-10-CM | POA: Diagnosis not present

## 2023-10-18 DIAGNOSIS — F32A Depression, unspecified: Secondary | ICD-10-CM | POA: Diagnosis not present

## 2023-10-18 DIAGNOSIS — F419 Anxiety disorder, unspecified: Secondary | ICD-10-CM | POA: Diagnosis not present

## 2023-10-18 DIAGNOSIS — E785 Hyperlipidemia, unspecified: Secondary | ICD-10-CM | POA: Diagnosis not present

## 2023-10-18 DIAGNOSIS — Z7952 Long term (current) use of systemic steroids: Secondary | ICD-10-CM | POA: Diagnosis not present

## 2023-10-18 DIAGNOSIS — Z7901 Long term (current) use of anticoagulants: Secondary | ICD-10-CM | POA: Diagnosis not present

## 2023-10-22 DIAGNOSIS — F32A Depression, unspecified: Secondary | ICD-10-CM | POA: Diagnosis not present

## 2023-10-22 DIAGNOSIS — I10 Essential (primary) hypertension: Secondary | ICD-10-CM | POA: Diagnosis not present

## 2023-10-22 DIAGNOSIS — I890 Lymphedema, not elsewhere classified: Secondary | ICD-10-CM | POA: Diagnosis not present

## 2023-10-22 DIAGNOSIS — Z7901 Long term (current) use of anticoagulants: Secondary | ICD-10-CM | POA: Diagnosis not present

## 2023-10-22 DIAGNOSIS — R7303 Prediabetes: Secondary | ICD-10-CM | POA: Diagnosis not present

## 2023-10-22 DIAGNOSIS — Z6841 Body Mass Index (BMI) 40.0 and over, adult: Secondary | ICD-10-CM | POA: Diagnosis not present

## 2023-10-22 DIAGNOSIS — Z7952 Long term (current) use of systemic steroids: Secondary | ICD-10-CM | POA: Diagnosis not present

## 2023-10-22 DIAGNOSIS — Z79891 Long term (current) use of opiate analgesic: Secondary | ICD-10-CM | POA: Diagnosis not present

## 2023-10-22 DIAGNOSIS — Z792 Long term (current) use of antibiotics: Secondary | ICD-10-CM | POA: Diagnosis not present

## 2023-10-22 DIAGNOSIS — D649 Anemia, unspecified: Secondary | ICD-10-CM | POA: Diagnosis not present

## 2023-10-22 DIAGNOSIS — L97228 Non-pressure chronic ulcer of left calf with other specified severity: Secondary | ICD-10-CM | POA: Diagnosis not present

## 2023-10-22 DIAGNOSIS — D75839 Thrombocytosis, unspecified: Secondary | ICD-10-CM | POA: Diagnosis not present

## 2023-10-22 DIAGNOSIS — M069 Rheumatoid arthritis, unspecified: Secondary | ICD-10-CM | POA: Diagnosis not present

## 2023-10-22 DIAGNOSIS — L03116 Cellulitis of left lower limb: Secondary | ICD-10-CM | POA: Diagnosis not present

## 2023-10-22 DIAGNOSIS — M1990 Unspecified osteoarthritis, unspecified site: Secondary | ICD-10-CM | POA: Diagnosis not present

## 2023-10-22 DIAGNOSIS — F419 Anxiety disorder, unspecified: Secondary | ICD-10-CM | POA: Diagnosis not present

## 2023-10-22 DIAGNOSIS — I872 Venous insufficiency (chronic) (peripheral): Secondary | ICD-10-CM | POA: Diagnosis not present

## 2023-10-22 DIAGNOSIS — J45909 Unspecified asthma, uncomplicated: Secondary | ICD-10-CM | POA: Diagnosis not present

## 2023-10-22 DIAGNOSIS — A419 Sepsis, unspecified organism: Secondary | ICD-10-CM | POA: Diagnosis not present

## 2023-10-22 DIAGNOSIS — E785 Hyperlipidemia, unspecified: Secondary | ICD-10-CM | POA: Diagnosis not present

## 2023-10-22 DIAGNOSIS — Z86711 Personal history of pulmonary embolism: Secondary | ICD-10-CM | POA: Diagnosis not present

## 2023-10-22 DIAGNOSIS — G8929 Other chronic pain: Secondary | ICD-10-CM | POA: Diagnosis not present

## 2023-10-24 ENCOUNTER — Other Ambulatory Visit: Payer: Self-pay

## 2023-10-24 NOTE — Patient Outreach (Signed)
 Complex Care Management   Visit Note  10/24/2023  Name:  [name] MRN: [id number] DOB: 06-01-1965  Situation: Referral received for Complex Care Management related to lymphedema I obtained verbal consent from Patient.  Visit completed with Patient  on the phone  Background:   Past Medical History:  Diagnosis Date   Anemia    Anxiety    Arthritis    Asthma    hx as child - no prob as adult - no inhaler   Blood transfusion 01/22/11   transfusion 2 units at [facility]   Depression 08/2010   psych assessment   Dyspnea    Fibroid    Headache(784.0)    rx for imitrex - last one jan   Keloid    Lymphedema    Pulmonary embolism (HCC)     Assessment: Patient Reported Symptoms:  Cognitive Cognitive Status: Able to follow simple commands, Alert and oriented to person, place, and time, Normal speech and language skills Cognitive/Intellectual Conditions Management [RPT]: None reported or documented in medical history or problem list      Neurological Neurological Review of Symptoms: No symptoms reported    HEENT HEENT Symptoms Reported: No symptoms reported      Cardiovascular Cardiovascular Symptoms Reported: Swelling in legs or feet Does patient have uncontrolled Hypertension?: No Cardiovascular Management Strategies: Medication therapy Cardiovascular Comment: Patient notes that LE swelling has decreased, back to baseline. She continues to use lymphedema pumps and is working back up to twice a day for an hour. She currently doing an hour a day.  Respiratory Respiratory Symptoms Reported: No symptoms reported    Endocrine Endocrine Symptoms Reported: Not assessed    Gastrointestinal Gastrointestinal Symptoms Reported: No symptoms reported Additional Gastrointestinal Details: Patient notes her appetite has increased since previous CMRN visit.      Genitourinary Genitourinary Symptoms Reported: Not assessed    Integumentary Integumentary Symptoms Reported: Other Other  Integumentary Symptoms: Patient reports wounds are healed. She is going to be discharged from [facility] RN this week. She no longer has dressings LLE. Skin Management Strategies: Routine screening Skin Comment: Patient reports she is waiting to have the money for copay before making appointment with the lymphedema clinic. She states she will have the money next week.  Musculoskeletal Musculoskelatal Symptoms Reviewed: Unsteady gait Musculoskeletal Management Strategies: Medical device (Walker) Falls in the past year?: Yes Number of falls in past year: 2 or more Was there an injury with Fall?: No Fall Risk Category Calculator: 2 Patient Fall Risk Level: Moderate Fall Risk Patient at Risk for Falls Due to: Impaired balance/gait Fall risk Follow up: Falls evaluation completed, Education provided  Psychosocial Psychosocial Symptoms Reported: Depression - if selected complete PHQ 2-9 Additional Psychological Details: Patient reports feelings of depression ahve improved since taking Buproprion and Lexapro  together as advised by PCP 09/14/23 Behavioral Management Strategies: Medication therapy Techniques to Cope with Loss/Stress/Change: Medication      10/24/2023    PHQ2-9 Depression Screening   Little interest or pleasure in doing things Several days  Feeling down, depressed, or hopeless Several days  PHQ-2 - Total Score 2  Trouble falling or staying asleep, or sleeping too much    Feeling tired or having little energy    Poor appetite or overeating     Feeling bad about yourself - or that you are a failure or have let yourself or your family down    Trouble concentrating on things, such as reading the newspaper or watching television    Moving  or speaking so slowly that other people could have noticed.  Or the opposite - being so fidgety or restless that you have been moving around a lot more than usual    Thoughts that you would be better off dead, or hurting yourself in some way    PHQ2-9 Total  Score    If you checked off any problems, how difficult have these problems made it for you to do your work, take care of things at home, or get along with other people    Depression Interventions/Treatment      There were no vitals filed for this visit.  Medications Reviewed Today     Reviewed by Arno Rosaline SQUIBB, RN (Registered Nurse) on 10/24/23 at 1309  Med List Status: <None>   Medication Order Taking? Sig Documenting Provider Last Dose Status Informant  albuterol  (VENTOLIN  HFA) 108 (90 Base) MCG/ACT inhaler [id number]  Inhale 2 puffs into the lungs every 6 (six) hours as needed for wheezing or shortness of breath. Theotis Haze ORN, NP  Active Self  apixaban  (ELIQUIS ) 5 MG TABS tablet [id number]  Take 1 tablet (5 mg total) by mouth 2 (two) times daily. Theotis Haze ORN, NP  Active Self  buPROPion  (WELLBUTRIN  XL) 150 MG 24 hr tablet [id number]  Take 1 tablet (150 mg total) by mouth daily. Theotis Haze ORN, NP  Active Self  Cholecalciferol (VITAMIN D3) 50 MCG (2000 UT) TABS [id number]  Take 2,000 Units by mouth at bedtime. [provider]  Active Self  CIMZIA, 2 SYRINGE, 200 MG/ML prefilled syringe [id number]  Inject 200 mg into the skin every 14 (fourteen) days.  Patient not taking: Reported on 10/01/2023   [provider]  Active Self           Med Note MARISA, NATHANEL SAILOR   Tue Sep 25, 2023  9:54 PM)    escitalopram  (LEXAPRO ) 10 MG tablet [id number]  Take 1 tablet (10 mg total) by mouth daily. For depression Theotis Haze ORN, NP  Active Self  ezetimibe  (ZETIA ) 10 MG tablet [id number]  Take 1 tablet (10 mg total) by mouth daily. For high cholesterol Fleming, [name] W, NP  Active Self  famotidine  (PEPCID ) 40 MG tablet [id number]  Take 1 tablet by mouth once daily Fleming, [name] W, NP  Active Self  folic acid  (FOLVITE ) 1 MG tablet [id number]  Take 1 tablet by mouth once daily Newlin, Enobong, MD  Active Self  gabapentin  (NEURONTIN ) 100 MG capsule [id number]  Take 200 mg by mouth at  bedtime. [provider]  Active Self  losartan  (COZAAR ) 25 MG tablet [id number]  Take 0.5 tablets (12.5 mg total) by mouth at bedtime. Thukkani, Arun K, MD  Active Self           Med Note MARISA, NATHANEL SAILOR   Tue Sep 25, 2023  9:48 PM)    meclizine  (ANTIVERT ) 12.5 MG tablet [id number]  Take 1-2 tablets (12.5-25 mg total) by mouth 3 (three) times daily as needed for dizziness. Theotis Haze ORN, NP  Active Self  Misc. Devices (BARIATRIC ROLLATOR) MISC [id number]  Please provide patient with insurance approved bariatric rollator walker. I89.0, R53.81, R26.81 Theotis Haze ORN, NP  Active   oxyCODONE  (OXY IR/ROXICODONE ) 5 MG immediate release tablet [id number]  Take 1 tablet (5 mg total) by mouth every 6 (six) hours as needed for moderate pain (pain score 4-6) or severe pain (pain score 7-10).  Patient not taking: Reported on 10/10/2023   Patsy Lenis, MD  Active  predniSONE  (DELTASONE ) 5 MG tablet [id number]  Take 5-10 mg by mouth daily after supper. [provider]  Active Self            Recommendation:   Specialty provider follow-up Lymphedema Clinic (patient will call to schedule) Continue Current Plan of Care  Follow Up Plan:   Telephone follow up appointment date/time:  11/21/23 at 1 PM  Rosaline Finlay, RN MSN [place]  [facility] RN Care Manager Direct Dial: [phone]  Fax: [phone]

## 2023-10-24 NOTE — Patient Instructions (Signed)
 Visit Information  Thank you for taking time to visit with me today. Please don't hesitate to contact me if I can be of assistance to you before our next scheduled appointment.  Your next care management appointment is by telephone on 11/21/23 at 1 PM  Please call the care guide team at [phone] if you need to cancel, schedule, or reschedule an appointment.   Please call the Suicide and Crisis Lifeline: 988 call 1-800-273-TALK (toll free, 24 hour hotline) if you are experiencing a Mental Health or Behavioral Health Crisis or need someone to talk to.  Rosaline Finlay, RN MSN   [facility] Population [facility] RN Care Manager Direct Dial: [phone]  Fax: [phone]

## 2023-10-26 DIAGNOSIS — Z7901 Long term (current) use of anticoagulants: Secondary | ICD-10-CM | POA: Diagnosis not present

## 2023-10-26 DIAGNOSIS — M1990 Unspecified osteoarthritis, unspecified site: Secondary | ICD-10-CM | POA: Diagnosis not present

## 2023-10-26 DIAGNOSIS — A419 Sepsis, unspecified organism: Secondary | ICD-10-CM | POA: Diagnosis not present

## 2023-10-26 DIAGNOSIS — Z7952 Long term (current) use of systemic steroids: Secondary | ICD-10-CM | POA: Diagnosis not present

## 2023-10-26 DIAGNOSIS — Z79891 Long term (current) use of opiate analgesic: Secondary | ICD-10-CM | POA: Diagnosis not present

## 2023-10-26 DIAGNOSIS — F419 Anxiety disorder, unspecified: Secondary | ICD-10-CM | POA: Diagnosis not present

## 2023-10-26 DIAGNOSIS — I890 Lymphedema, not elsewhere classified: Secondary | ICD-10-CM | POA: Diagnosis not present

## 2023-10-26 DIAGNOSIS — J45909 Unspecified asthma, uncomplicated: Secondary | ICD-10-CM | POA: Diagnosis not present

## 2023-10-26 DIAGNOSIS — F32A Depression, unspecified: Secondary | ICD-10-CM | POA: Diagnosis not present

## 2023-10-26 DIAGNOSIS — Z6841 Body Mass Index (BMI) 40.0 and over, adult: Secondary | ICD-10-CM | POA: Diagnosis not present

## 2023-10-26 DIAGNOSIS — M069 Rheumatoid arthritis, unspecified: Secondary | ICD-10-CM | POA: Diagnosis not present

## 2023-10-26 DIAGNOSIS — L97228 Non-pressure chronic ulcer of left calf with other specified severity: Secondary | ICD-10-CM | POA: Diagnosis not present

## 2023-10-26 DIAGNOSIS — G8929 Other chronic pain: Secondary | ICD-10-CM | POA: Diagnosis not present

## 2023-10-26 DIAGNOSIS — R7303 Prediabetes: Secondary | ICD-10-CM | POA: Diagnosis not present

## 2023-10-26 DIAGNOSIS — E785 Hyperlipidemia, unspecified: Secondary | ICD-10-CM | POA: Diagnosis not present

## 2023-10-26 DIAGNOSIS — Z792 Long term (current) use of antibiotics: Secondary | ICD-10-CM | POA: Diagnosis not present

## 2023-10-26 DIAGNOSIS — D75839 Thrombocytosis, unspecified: Secondary | ICD-10-CM | POA: Diagnosis not present

## 2023-10-26 DIAGNOSIS — D649 Anemia, unspecified: Secondary | ICD-10-CM | POA: Diagnosis not present

## 2023-10-26 DIAGNOSIS — I872 Venous insufficiency (chronic) (peripheral): Secondary | ICD-10-CM | POA: Diagnosis not present

## 2023-10-26 DIAGNOSIS — Z86711 Personal history of pulmonary embolism: Secondary | ICD-10-CM | POA: Diagnosis not present

## 2023-10-26 DIAGNOSIS — I10 Essential (primary) hypertension: Secondary | ICD-10-CM | POA: Diagnosis not present

## 2023-10-26 DIAGNOSIS — L03116 Cellulitis of left lower limb: Secondary | ICD-10-CM | POA: Diagnosis not present

## 2023-11-02 ENCOUNTER — Telehealth: Payer: Self-pay | Admitting: *Deleted

## 2023-11-02 NOTE — Progress Notes (Signed)
 Complex Care Management Note Care Guide Note  11/02/2023 Name: [name] MRN: [id number] DOB: Aug 12, 1965  [name] is a 58 y.o. year old female who is a primary care patient of Theotis Haze ORN, NP . The community resource team was consulted for assistance with Transportation Needs   SDOH screenings and interventions completed:  Yes  Social Drivers of Health From This Encounter   Transportation Needs: Unmet Transportation Needs (11/02/2023)   PRAPARE - Administrator, Civil Service (Medical): Yes    Lack of Transportation (Non-Medical): No    SDOH Interventions Today    Flowsheet Row Most Recent Value  SDOH Interventions   Transportation Interventions Community Resources Provided  [employer] online application ,called patient to let her know her that her transportation has been approved through [employer] and told her how to schedule .]     Care guide performed the following interventions: Patient provided with information about care guide support team and interviewed to confirm resource needs.  Follow Up Plan:  No further follow up planned at this time. The patient has been provided with needed resources.  Encounter Outcome:  Patient Visit Completed  Alexsys Eskin Greenauer[place]Moran  [facility] Care Guide  Direct Dial:[phone] [phone] Website: [email or web address]

## 2023-11-15 DIAGNOSIS — I890 Lymphedema, not elsewhere classified: Secondary | ICD-10-CM | POA: Diagnosis not present

## 2023-11-21 ENCOUNTER — Other Ambulatory Visit: Payer: Self-pay

## 2023-11-21 NOTE — Patient Outreach (Signed)
 Complex Care Management   Visit Note  11/21/2023  Name:  [name] MRN: [id number] DOB: Sep 30, 1965  Situation: Referral received for Complex Care Management related to lymphedema I obtained verbal consent from Patient.  Visit completed with Patient  on the phone  Background:   Past Medical History:  Diagnosis Date   Anemia    Anxiety    Arthritis    Asthma    hx as child - no prob as adult - no inhaler   Blood transfusion 01/22/11   transfusion 2 units at [facility]   Depression 08/2010   psych assessment   Dyspnea    Fibroid    Headache(784.0)    rx for imitrex - last one jan   Keloid    Lymphedema    Pulmonary embolism (HCC)     Assessment: Patient Reported Symptoms:  Cognitive Cognitive Status: Able to follow simple commands, Alert and oriented to person, place, and time, Normal speech and language skills Cognitive/Intellectual Conditions Management [RPT]: None reported or documented in medical history or problem list      Neurological Neurological Review of Symptoms: No symptoms reported    HEENT HEENT Symptoms Reported: No symptoms reported      Cardiovascular Cardiovascular Symptoms Reported: Swelling in legs or feet Does patient have uncontrolled Hypertension?: Yes Is patient checking Blood Pressure at home?: No Patient's Recent BP reading at home: Note that patient's BP during most recent office visit was elevated. She does not check BP at home, but reports when the RN was coming to her house for dressing changes she was always told her BP was within goal. She denies symptoms related to high BP. Cardiovascular Management Strategies: Medication therapy, Routine screening Cardiovascular Comment: Patient feels that LE swelling is at baseline. She reports that she continues to use lymphedema pumps at nighttime.  Respiratory Respiratory Symptoms Reported: No symptoms reported    Endocrine Endocrine Symptoms Reported: No symptoms reported Is patient diabetic?: No     Gastrointestinal Gastrointestinal Symptoms Reported: No symptoms reported Additional Gastrointestinal Details: Patient reports she was recently prescribed prednisone  for arthritis. She reports this caused an increase in her appetite.      Genitourinary Genitourinary Symptoms Reported: Not assessed    Integumentary Integumentary Symptoms Reported: No symptoms reported Additional Integumentary Details: Patient reports wounds have completely healed. She is returning to lymphedema clinic next Thursday. Skin Management Strategies: Routine screening  Musculoskeletal Musculoskelatal Symptoms Reviewed: Unsteady gait, Joint pain Additional Musculoskeletal Details: Patient notes recent issues with arthritis. She was prescribed a course of prednisone . Patient reports she goes back to rheumatology late October. Musculoskeletal Management Strategies: Medical device, Exercise Frieda) Musculoskeletal Comment: Patient reports she is going to PT to get her legs wrapped and participate in exercise as advised. She is going twice a week. She denies falls since previous CMRN visit. Falls in the past year?: Yes Number of falls in past year: 2 or more Was there an injury with Fall?: No Fall Risk Category Calculator: 2 Patient Fall Risk Level: Moderate Fall Risk Patient at Risk for Falls Due to: Impaired balance/gait Fall risk Follow up: Falls evaluation completed, Education provided, Falls prevention discussed  Psychosocial Psychosocial Symptoms Reported: Not assessed          11/21/2023    PHQ2-9 Depression Screening   Little interest or pleasure in doing things    Feeling down, depressed, or hopeless    PHQ-2 - Total Score    Trouble falling or staying asleep, or sleeping too much  Feeling tired or having little energy    Poor appetite or overeating     Feeling bad about yourself - or that you are a failure or have let yourself or your family down    Trouble concentrating on things, such as  reading the newspaper or watching television    Moving or speaking so slowly that other people could have noticed.  Or the opposite - being so fidgety or restless that you have been moving around a lot more than usual    Thoughts that you would be better off dead, or hurting yourself in some way    PHQ2-9 Total Score    If you checked off any problems, how difficult have these problems made it for you to do your work, take care of things at home, or get along with other people    Depression Interventions/Treatment      There were no vitals filed for this visit.  Medications Reviewed Today     Reviewed by Arno Rosaline SQUIBB, RN (Registered Nurse) on 11/21/23 at 1307  Med List Status: <None>   Medication Order Taking? Sig Documenting Provider Last Dose Status Informant  albuterol  (VENTOLIN  HFA) 108 (90 Base) MCG/ACT inhaler [id number]  Inhale 2 puffs into the lungs every 6 (six) hours as needed for wheezing or shortness of breath. Theotis Haze ORN, NP  Active Self  apixaban  (ELIQUIS ) 5 MG TABS tablet [id number]  Take 1 tablet (5 mg total) by mouth 2 (two) times daily. Theotis Haze ORN, NP  Active Self  buPROPion  (WELLBUTRIN  XL) 150 MG 24 hr tablet [id number]  Take 1 tablet (150 mg total) by mouth daily. Theotis Haze ORN, NP  Active Self  Cholecalciferol (VITAMIN D3) 50 MCG (2000 UT) TABS [id number]  Take 2,000 Units by mouth at bedtime. [provider]  Active Self  CIMZIA, 2 SYRINGE, 200 MG/ML prefilled syringe [id number]  Inject 200 mg into the skin every 14 (fourteen) days.  Patient not taking: Reported on 10/01/2023   [provider]  Active Self           Med Note MARISA, NATHANEL SAILOR   Tue Sep 25, 2023  9:54 PM)    escitalopram  (LEXAPRO ) 10 MG tablet [id number]  Take 1 tablet (10 mg total) by mouth daily. For depression Theotis Haze ORN, NP  Active Self  ezetimibe  (ZETIA ) 10 MG tablet [id number]  Take 1 tablet (10 mg total) by mouth daily. For high cholesterol Theotis Haze ORN, NP   Active Self  famotidine  (PEPCID ) 40 MG tablet [id number]  Take 1 tablet by mouth once daily Fleming, [name] W, NP  Active Self  folic acid  (FOLVITE ) 1 MG tablet [id number]  Take 1 tablet by mouth once daily Newlin, Enobong, MD  Active Self  gabapentin  (NEURONTIN ) 100 MG capsule [id number]  Take 200 mg by mouth at bedtime. [provider]  Active Self  losartan  (COZAAR ) 25 MG tablet [id number]  Take 0.5 tablets (12.5 mg total) by mouth at bedtime. Thukkani, Arun K, MD  Active Self           Med Note MARISA, NATHANEL SAILOR   Tue Sep 25, 2023  9:48 PM)    meclizine  (ANTIVERT ) 12.5 MG tablet [id number]  Take 1-2 tablets (12.5-25 mg total) by mouth 3 (three) times daily as needed for dizziness. Theotis Haze ORN, NP  Active Self  Misc. Devices (BARIATRIC ROLLATOR) MISC [id number]  Please provide patient with insurance approved bariatric rollator walker. I89.0, R53.81, R26.81 Theotis Haze ORN, NP  Active   oxyCODONE  (OXY IR/ROXICODONE ) 5 MG immediate release tablet [id number]  Take 1 tablet (5 mg total) by mouth every 6 (six) hours as needed for moderate pain (pain score 4-6) or severe pain (pain score 7-10).  Patient not taking: Reported on 10/10/2023   Patsy Lenis, MD  Active   predniSONE  (DELTASONE ) 5 MG tablet [id number]  Take 5-10 mg by mouth daily after supper. [provider]  Active Self            Recommendation:   PCP Follow-up Specialty provider follow-up lymphedema clinic - patient reports visit is scheduled next week Continue Current Plan of Care  Follow Up Plan:   Closing From:  Complex Care Management Patient has met all care management goals. Care Management case will be closed. Patient has been provided contact information should new needs arise.   Rosaline Finlay, RN MSN [place]  [facility] Population [facility] RN Care Manager Direct Dial: [phone]  Fax: [phone]

## 2023-11-21 NOTE — Patient Instructions (Signed)
 Visit Information  Thank you for taking time to visit with me today. Please don't hesitate to contact me if I can be of assistance to you before our next scheduled appointment.  Your next care management appointment is no further scheduled appointments.    Closing From: Complex Care Management. Patient has met all care management goals. Care Management case will be closed. Patient has been provided contact information should new needs arise.   Please call the care guide team at [phone] if you need to cancel, schedule, or reschedule an appointment.   Please call the Suicide and Crisis Lifeline: 988 call 1-800-273-TALK (toll free, 24 hour hotline) if you are experiencing a Mental Health or Behavioral Health Crisis or need someone to talk to.  Rosaline Finlay, RN MSN [place]  [facility] Population [facility] RN Care Manager Direct Dial: [phone]  Fax: [phone]

## 2023-11-29 DIAGNOSIS — I872 Venous insufficiency (chronic) (peripheral): Secondary | ICD-10-CM | POA: Diagnosis not present

## 2023-11-29 DIAGNOSIS — I890 Lymphedema, not elsewhere classified: Secondary | ICD-10-CM | POA: Diagnosis not present

## 2023-11-29 DIAGNOSIS — L97222 Non-pressure chronic ulcer of left calf with fat layer exposed: Secondary | ICD-10-CM | POA: Diagnosis not present

## 2023-11-29 NOTE — Progress Notes (Signed)
 Physical Therapy Visit - Daily Note   Payor: [employer]ET[employer] MEDICARE ADV / Plan: AETNA [place] / Product Type: Medicare Advantage /   Visit Count: 3  Rehabilitation Precautions/Restrictions:   Precautions/Restrictions Precautions: wounds        Referring Diagnosis: lymphedema LE's   SUBJECTIVE Patient Report:   I was in the hospital for celulitis of my left leg at end of July.  I have my toe wraps and my circaid reduction kits (and PAC bands)  Pain:   pt reports L wrist swelling/pain from arthritis   OBJECTIVE  General Observation/Objective Measures:  Pt comes in walking with rollator- forward trunk lean.    Interventions: Orthotic fitting:  Donned cream colored stocking that came with circaids.   (Self care:  Wrapped 1-4th toes with mollelast- videotaped technique with her phone if she got help with donning.  Educated on how to apply all compression daily   X 10 min) Orthotic fitting (continued):  Donned Circaid reduction kit to B lower legs- so she is able to have about 3 of room for reduction when tightening long rectangular straps snugly.  Cut excess material.  Donned Circaid PAC band to B feet- video taped with her phone.  Marked with ink pen where to place foot and educated on best technique for coverage.  Added a shelf strap at B dorsal feet for extra compression.  Folded under stocking on top of lower leg so velcro's don't get snagged easily.  Pt advised to don daily, using amlactin on rough sandpaper like skin.  Remove or loosen if painful.  X 40 min.     Education:  yes, see interventions    ASSESSMENT Pt obtained her compression garments- fitted it to her and taught her how to use.  Encouraged exercise with garments on.  Pt demonstrated improved gait/posture when walking out- reports legs felt good.  Will try to schedule 2x/wk x 4 weeks for now to do MLD/ reducing with Progressive adjusting of circaid.     Therapy Diagnosis:     ICD-10-CM   1. Lymphedema  I89.0      Progress Towards Goals:     Goals Addressed             This Visit's Progress   . PT Goals- est on 08/27/23       STG's to be met in 12 visits: Pt will be able to rewrap/bandage lower legs/feet independently every 48hrs. Pt will obtain circaid reduction kits to have a way to more quickly apply low stretch compression to B LE's. Pt will be independent in self MLD and diaphragmatic breathing to improve lymphatic circulation.  LTG's to be met in 25 visits: 4. Pt will have reduction in edema by 5cm at ankles and 6cm at calves. 5. Pt will report good self-management of swelling. 6. Pt will have improved TUG to < 28seconds with rollator.  7.  Pt will have improved STS in 30 seconds to 10.          PLAN Treatment Frequency and Duration:  PT Frequency: 2x/week PT Duration: 12 weeks Treatment Plan Details: 25 visits within 3 months (by 11/27/23)  Recommended PT Treatment/Interventions: Manual therapy (02859); Self-care/home management [phone]); Therapeutic exercise (97110); Orthotics fit/training (97760/97763)   Recommended Consults:  None currently  Development of Plan of Care:  Patient participated in plan of care development.  Total Treatment Time (Time & Untimed): Total Treatment minutes: 50 Total Time in Timed Codes: Timed Code Minutes: 50     PT  Treatment/Procedure Self-Care/Home Mgmt Training minutes: 10 Orthotic Management and Fitting Initial Enc minutes: 40        As the clinical instructor I directed the activities, made all applicable skilled judgements, and am responsible for the assessment and treatment of the patient in today's session.  The therapy student did participate in the delivery of the skilled interventions and development of the documentation of the therapy session, under my direct supervision and participation.

## 2023-11-29 NOTE — Telephone Encounter (Signed)
 Copied from CRM #[id number]. Topic: Clinical - Request for Lab/Test Order >> Nov 29, 2023  9:03 AM Darshell M wrote: Reason for CRM: [name] with [facility] [phone]. Calling to have multiple orders from 09/29/2023 signed so they can bill insurance.

## 2023-11-29 NOTE — Telephone Encounter (Signed)
Noted, will inform provider.

## 2023-12-03 DIAGNOSIS — I872 Venous insufficiency (chronic) (peripheral): Secondary | ICD-10-CM | POA: Diagnosis not present

## 2023-12-03 NOTE — Progress Notes (Signed)
 Physical Therapy Visit - Daily Note   Payor: [employer] MEDICARE ADV / Plan: AETNA [place] / Product Type: Medicare Advantage /   Visit Count: 4  Rehabilitation Precautions/Restrictions:   Precautions/Restrictions Precautions: wounds        Referring Diagnosis: lymphedema LE's   SUBJECTIVE Patient Report:   My transportation drops me off way early- but I just wait in waiting room  Pain:  Pain Assessment Pain Assessment: 0-10 Pain Score  : 9 Pain Type: Chronic pain Pain Location: Wrist Pain Orientation: Right, Left9/10 in pt hands and wrist   OBJECTIVE  General Observation/Objective Measures:  Pt walks in with rollator and her circaid for R lower leg and 2 PAC bands in her bag- which got covered in greek yogurt- it spilled in her bag.     Interventions:  Manual therapy:    Pt assisted in donning right leg circaid reduction kit and tightening stationary velcro straps due to leg smaller.  Applied toe wraps (mollelast to 1-4 toes.  Her Left 2nd/3rd toes were wrapped together due to very small web space between).  Donned B circaid PAC bands. Added shelf straps to dorsal feet to reduce edema.   Manual lymph drainage to B cervical, supraclavicular, axillary and inguinal lymph nodes as well as up B inguino- axillary anastomoses.  MLD up B. LE's to knees with emphasis at medial thighs.  Deep abdominal MLD.  Pt instructed in diaphragmatic breathing with manual resistance. X 60 min.  TherEx: NuStep L1 x 15 min (no charge) for U/LE's.  Pt given seated U/LE ex's ideas- to do for home.  Reviewed verbally and gave handout x 5 min.  Education:  yes, see interventions     ASSESSMENT Pt reported to the clinic today with needing assistance tightening her right leg circaid due to pain in her hands and wrist. R leg smaller as seen by stationary straps needed tightening. Reports wrist pain decreased to 6/10 after Nustep.     Therapy Diagnosis:     ICD-10-CM   1. Lymphedema  I89.0     Progress  Towards Goals:   Good   Goals Addressed             This Visit's Progress   . PT Goals- est on 08/27/23       STG's to be met in 12 visits: Pt will be able to rewrap/bandage lower legs/feet independently every 48hrs. Pt will obtain circaid reduction kits to have a way to more quickly apply low stretch compression to B LE's.  Met on 11/29/23 Pt will be independent in self MLD and diaphragmatic breathing to improve lymphatic circulation.  Met on 11/29/23  LTG's to be met in 25 visits: by 02/29/24 4. Pt will have reduction in edema by 5cm at ankles and 6cm at calves. 5. Pt will report good self-management of swelling. 6. Pt will have improved TUG to < 28seconds with rollator.  7.  Pt will have improved STS in 30 seconds to 10.          PLAN Treatment Frequency and Duration:  PT Frequency: 2x/week PT Duration: 12 weeks Treatment Plan Details: 25 visits within 6 months - extend date to 02/29/24  Recommended PT Treatment/Interventions: Manual therapy (97140); Self-care/home management [id number]); Therapeutic exercise (97110); Orthotics fit/training (97760/97763)   Recommended Consults:  None currently  Development of Plan of Care:  Patient participated in plan of care development.  Total Treatment Time (Time & Untimed): Total Treatment minutes: 65 Total Time in Timed Codes: Timed  Code Minutes: 65     PT Treatment/Procedure Therapeutic Exercises minutes: 5 Manual Therapy Minutes: 60        As the clinical instructor I directed the activities, made all applicable skilled judgements, and am responsible for the assessment and treatment of the patient in today's session.  The therapy student did participate in the delivery of the skilled interventions and development of the documentation of the therapy session, under my direct supervision and participation.    The patient has been instructed to contact our clinic if any questions or problems should arise.

## 2023-12-05 ENCOUNTER — Other Ambulatory Visit: Payer: Self-pay | Admitting: Family Medicine

## 2023-12-05 ENCOUNTER — Telehealth: Payer: Self-pay | Admitting: Nurse Practitioner

## 2023-12-05 DIAGNOSIS — D649 Anemia, unspecified: Secondary | ICD-10-CM

## 2023-12-05 NOTE — Telephone Encounter (Signed)
 Forms have been signed and faxed. This will be the second time I've faxed the forms.

## 2023-12-05 NOTE — Telephone Encounter (Signed)
 Copied from CRM [id number]. Topic: Clinical - Lab/Test Results >> Dec 05, 2023  1:24 PM Rachelle R wrote: Reason for CRM: Reva with [facility]. Calling to have multiple orders from 8/2, 8/19, 9/2 signed so they can bill insurance. Forms where faxed to the office on 8/19 and again on 09/02.  Reba can be reached at [phone]

## 2023-12-12 NOTE — Telephone Encounter (Signed)
 Copied from CRM [id number]. Topic: Clinical - Home Health Verbal Orders >> Dec 10, 2023  4:59 PM Leonette SQUIBB wrote:  Caller/Agency: Reba  with  [facility]  Callback Number: [phone]  Service Requested:  Margherita says they are not getting the pain of care and 5 verbal orders that she is needing back.  She said for some reason they are not getting the fax.  I verified their fax number .  She is asking for a call or either for them to be faxed directly to her .  Attn Margherita Johnita Dauphin  Frequency:  Any new concerns about the patient? No

## 2023-12-13 ENCOUNTER — Telehealth: Payer: Self-pay | Admitting: Nurse Practitioner

## 2023-12-13 DIAGNOSIS — I890 Lymphedema, not elsewhere classified: Secondary | ICD-10-CM | POA: Diagnosis not present

## 2023-12-13 NOTE — Telephone Encounter (Signed)
 Faxing over order today.

## 2023-12-13 NOTE — Telephone Encounter (Signed)
 All orders have been faxed again for the 4th time and a conformation of delivery has been received.

## 2023-12-13 NOTE — Telephone Encounter (Signed)
 Copied from CRM [id number]. Topic: Clinical - Home Health Verbal Orders >> Dec 13, 2023  8:41 AM Olam RAMAN wrote:  Caller/Agency: [facility] Callback Number: [phone] Service Requested: Skilled Nursing Frequency: start period 6/17-8/29 Any new concerns about the patient? No

## 2023-12-13 NOTE — Telephone Encounter (Signed)
 Copied from CRM #[id number]. Topic: Medical Record Request - Records Request >> Dec 13, 2023  8:41 AM Olam RAMAN wrote:  Reason for CRM: CB [phone] Reba from  [facility] still waiting for forms to be faxed and needs them for PT medical records needs them urgently and pt has been discharged since 8/29  >> Dec 13, 2023  8:46 AM Olam RAMAN wrote:  Caller also advised they can also drop off/pick up if needed. Fax: [phone] goes to fax and caller, Reba email as well. Please advise

## 2023-12-14 NOTE — Telephone Encounter (Signed)
 Copied from CRM #[id number]. Topic: Medical Record Request - Records Request >> Dec 13, 2023  8:41 AM Olam RAMAN wrote:  Reason for CRM: [id number] [phone] Reba from  [facility] still waiting for forms to be faxed and needs them for PT medical records needs them urgently and pt has been discharged since 8/29  >> Dec 14, 2023 11:30 AM Montie POUR wrote:  Margherita is calling back to let Joyanna know that she faxed over 5 additional forms today. Please fax back to [phone] when the forms are signed and dated.  >> Dec 13, 2023  8:46 AM Olam RAMAN wrote: Caller also advised they can also drop off/pick up if needed. Fax: [phone] goes to fax and caller, Reba email as well. Please advise

## 2023-12-17 ENCOUNTER — Ambulatory Visit: Admitting: Nurse Practitioner

## 2023-12-17 NOTE — Telephone Encounter (Signed)
 Forms faxed for the 5th time.

## 2023-12-19 DIAGNOSIS — L0390 Cellulitis, unspecified: Secondary | ICD-10-CM | POA: Diagnosis not present

## 2023-12-19 DIAGNOSIS — I890 Lymphedema, not elsewhere classified: Secondary | ICD-10-CM | POA: Diagnosis not present

## 2023-12-19 DIAGNOSIS — M0579 Rheumatoid arthritis with rheumatoid factor of multiple sites without organ or systems involvement: Secondary | ICD-10-CM | POA: Diagnosis not present

## 2023-12-19 DIAGNOSIS — E559 Vitamin D deficiency, unspecified: Secondary | ICD-10-CM | POA: Diagnosis not present

## 2023-12-19 DIAGNOSIS — R5382 Chronic fatigue, unspecified: Secondary | ICD-10-CM | POA: Diagnosis not present

## 2023-12-19 DIAGNOSIS — D6861 Antiphospholipid syndrome: Secondary | ICD-10-CM | POA: Diagnosis not present

## 2023-12-21 NOTE — Progress Notes (Signed)
 Pt no-showed today for PT appt.  Called and she reports being on a new steroid medicine for her RA and it's taken a while to kick in.  Reports inability to walk in last few days due to pain.  Wants to cancel 12/24/23 appt and will hopefully be ambulatory by 10/30 appt.

## 2023-12-31 NOTE — Telephone Encounter (Signed)
 Copied from CRM [id number]. Topic: Clinical - Home Health Verbal Orders >> Dec 31, 2023  2:29 PM Darshell M wrote:  Caller/Agency: Suzen from [facility] Number: [phone], fax [phone]  Jacqeline calling regarding document 192 faxed 12/14/2023. There were several documents which were included. All were signed and received except the verbal order referencing ulcer on left calf. Peggi is okay to print document and bring it to the clinic for signature.

## 2023-12-31 NOTE — Telephone Encounter (Signed)
 Noted

## 2024-01-01 DIAGNOSIS — I872 Venous insufficiency (chronic) (peripheral): Secondary | ICD-10-CM | POA: Diagnosis not present

## 2024-01-01 DIAGNOSIS — I890 Lymphedema, not elsewhere classified: Secondary | ICD-10-CM | POA: Diagnosis not present

## 2024-01-01 DIAGNOSIS — L97222 Non-pressure chronic ulcer of left calf with fat layer exposed: Secondary | ICD-10-CM | POA: Diagnosis not present

## 2024-01-11 NOTE — Progress Notes (Signed)
 [name]                                          MRN: [id number]   01/11/2024   The [facility] Quality Team Specialist reviewed this patient medical record for the purposes of chart review for care gap closure. The following were reviewed: chart review for care gap closure-controlling blood pressure.    [facility] Quality Team

## 2024-01-13 ENCOUNTER — Encounter (HOSPITAL_COMMUNITY): Payer: Self-pay | Admitting: Family Medicine

## 2024-01-13 ENCOUNTER — Inpatient Hospital Stay (HOSPITAL_COMMUNITY)
Admission: EM | Admit: 2024-01-13 | Discharge: 2024-01-18 | DRG: 603 | Disposition: A | Discharge status: Home Health | Attending: Internal Medicine | Admitting: Internal Medicine

## 2024-01-13 ENCOUNTER — Other Ambulatory Visit: Payer: Self-pay

## 2024-01-13 DIAGNOSIS — Z8719 Personal history of other diseases of the digestive system: Secondary | ICD-10-CM

## 2024-01-13 DIAGNOSIS — M069 Rheumatoid arthritis, unspecified: Secondary | ICD-10-CM | POA: Diagnosis present

## 2024-01-13 DIAGNOSIS — Z91018 Allergy to other foods: Secondary | ICD-10-CM

## 2024-01-13 DIAGNOSIS — D649 Anemia, unspecified: Secondary | ICD-10-CM | POA: Diagnosis present

## 2024-01-13 DIAGNOSIS — Z8249 Family history of ischemic heart disease and other diseases of the circulatory system: Secondary | ICD-10-CM

## 2024-01-13 DIAGNOSIS — L03116 Cellulitis of left lower limb: Secondary | ICD-10-CM | POA: Diagnosis not present

## 2024-01-13 DIAGNOSIS — Z881 Allergy status to other antibiotic agents status: Secondary | ICD-10-CM

## 2024-01-13 DIAGNOSIS — Z888 Allergy status to other drugs, medicaments and biological substances status: Secondary | ICD-10-CM

## 2024-01-13 DIAGNOSIS — I5A Non-ischemic myocardial injury (non-traumatic): Secondary | ICD-10-CM | POA: Diagnosis present

## 2024-01-13 DIAGNOSIS — I10 Essential (primary) hypertension: Secondary | ICD-10-CM | POA: Diagnosis present

## 2024-01-13 DIAGNOSIS — I890 Lymphedema, not elsewhere classified: Secondary | ICD-10-CM | POA: Diagnosis present

## 2024-01-13 DIAGNOSIS — Z6841 Body Mass Index (BMI) 40.0 and over, adult: Secondary | ICD-10-CM

## 2024-01-13 DIAGNOSIS — J45909 Unspecified asthma, uncomplicated: Secondary | ICD-10-CM | POA: Diagnosis present

## 2024-01-13 DIAGNOSIS — Z751 Person awaiting admission to adequate facility elsewhere: Secondary | ICD-10-CM

## 2024-01-13 DIAGNOSIS — Z90710 Acquired absence of both cervix and uterus: Secondary | ICD-10-CM

## 2024-01-13 DIAGNOSIS — R0789 Other chest pain: Secondary | ICD-10-CM

## 2024-01-13 DIAGNOSIS — Z880 Allergy status to penicillin: Secondary | ICD-10-CM

## 2024-01-13 DIAGNOSIS — Z7901 Long term (current) use of anticoagulants: Secondary | ICD-10-CM

## 2024-01-13 DIAGNOSIS — F419 Anxiety disorder, unspecified: Secondary | ICD-10-CM | POA: Diagnosis present

## 2024-01-13 DIAGNOSIS — Z7952 Long term (current) use of systemic steroids: Secondary | ICD-10-CM

## 2024-01-13 DIAGNOSIS — Z91030 Bee allergy status: Secondary | ICD-10-CM

## 2024-01-13 DIAGNOSIS — F32A Depression, unspecified: Secondary | ICD-10-CM | POA: Diagnosis present

## 2024-01-13 DIAGNOSIS — Z91040 Latex allergy status: Secondary | ICD-10-CM

## 2024-01-13 DIAGNOSIS — E7800 Pure hypercholesterolemia, unspecified: Secondary | ICD-10-CM | POA: Diagnosis present

## 2024-01-13 DIAGNOSIS — Z79899 Other long term (current) drug therapy: Secondary | ICD-10-CM

## 2024-01-13 DIAGNOSIS — Z86711 Personal history of pulmonary embolism: Secondary | ICD-10-CM

## 2024-01-13 LAB — FERRITIN: Ferritin: 160 ng/mL (ref 11–307)

## 2024-01-13 LAB — TROPONIN T, HIGH SENSITIVITY
Troponin T High Sensitivity: 26 ng/L — ABNORMAL HIGH (ref 0–19)
Troponin T High Sensitivity: 24 ng/L — ABNORMAL HIGH (ref 0–19)

## 2024-01-13 LAB — COMPREHENSIVE METABOLIC PANEL WITH GFR
ALT: 6 U/L (ref 0–44)
AST: 16 U/L (ref 15–41)
Albumin: 3.5 g/dL (ref 3.5–5.0)
Alkaline Phosphatase: 54 U/L (ref 38–126)
Anion gap: 8 (ref 5–15)
BUN: 12 mg/dL (ref 6–20)
CO2: 28 mmol/L (ref 22–32)
Calcium: 9.9 mg/dL (ref 8.9–10.3)
Chloride: 106 mmol/L (ref 98–111)
Creatinine, Ser: 0.99 mg/dL (ref 0.44–1.00)
GFR, Estimated: >60 mL/min (ref >60)
Glucose, Bld: 159 mg/dL — ABNORMAL HIGH (ref 70–99)
Potassium: 4.1 mmol/L (ref 3.5–5.1)
Sodium: 141 mmol/L (ref 135–145)
Total Bilirubin: 1.2 mg/dL (ref 0.0–1.2)
Total Protein: 8 g/dL (ref 6.5–8.1)

## 2024-01-13 LAB — CBC WITH DIFFERENTIAL/PLATELET
Abs Immature Granulocytes: 0.02 K/uL (ref 0.00–0.07)
Basophils Absolute: 0 K/uL (ref 0.0–0.1)
Basophils Relative: 0 %
Eosinophils Absolute: 0.3 K/uL (ref 0.0–0.5)
Eosinophils Relative: 5 %
HCT: 32.8 % — ABNORMAL LOW (ref 36.0–46.0)
Hemoglobin: 10.2 g/dL — ABNORMAL LOW (ref 12.0–15.0)
Immature Granulocytes: 0 %
Lymphocytes Relative: 16 %
Lymphs Abs: 0.9 K/uL (ref 0.7–4.0)
MCH: 28.5 pg (ref 26.0–34.0)
MCHC: 31.1 g/dL (ref 30.0–36.0)
MCV: 91.6 fL (ref 80.0–100.0)
Monocytes Absolute: 0.6 K/uL (ref 0.1–1.0)
Monocytes Relative: 10 %
Neutro Abs: 3.9 K/uL (ref 1.7–7.7)
Neutrophils Relative %: 69 %
Platelets: 290 K/uL (ref 150–400)
RBC: 3.58 MIL/uL — ABNORMAL LOW (ref 3.87–5.11)
RDW: 16.1 % — ABNORMAL HIGH (ref 11.5–15.5)
WBC: 5.7 K/uL (ref 4.0–10.5)
nRBC: 0 % (ref 0.0–0.2)

## 2024-01-13 LAB — I-STAT CG4 LACTIC ACID, ED: Lactic Acid, Venous: 1.6 mmol/L (ref 0.5–1.9)

## 2024-01-13 LAB — PROCALCITONIN: Procalcitonin: <0.1 ng/mL

## 2024-01-13 LAB — IRON AND TIBC
Iron: 28 ug/dL (ref 28–170)
Saturation Ratios: 11 % (ref 10.4–31.8)
TIBC: 249 ug/dL — ABNORMAL LOW (ref 250–450)
UIBC: 221 ug/dL

## 2024-01-13 MED ORDER — FAMOTIDINE 20 MG PO TABS
40.0000 mg | ORAL_TABLET | Freq: Every day | ORAL | Status: DC
  Administered 2024-01-14 – 2024-01-18 (×5): 40 mg via ORAL
  Filled 2024-01-13 (×5): qty 2

## 2024-01-13 MED ORDER — ACETAMINOPHEN 325 MG PO TABS: 650.0000 mg | ORAL_TABLET | Freq: Four times a day (QID) | ORAL | Status: DC | PRN

## 2024-01-13 MED ORDER — BUPROPION HCL ER (XL) 150 MG PO TB24
150.0000 mg | ORAL_TABLET | Freq: Every day | ORAL | Status: DC
  Administered 2024-01-14 – 2024-01-18 (×5): 150 mg via ORAL
  Filled 2024-01-13 (×5): qty 1

## 2024-01-13 MED ORDER — MORPHINE SULFATE (PF) 4 MG/ML IV SOLN
4.0000 mg | Freq: Once | INTRAVENOUS | Status: AC
  Administered 2024-01-13: 4 mg via INTRAVENOUS
  Filled 2024-01-13: qty 1

## 2024-01-13 MED ORDER — HYDROXYCHLOROQUINE SULFATE 200 MG PO TABS
200.0000 mg | ORAL_TABLET | Freq: Two times a day (BID) | ORAL | Status: DC
  Administered 2024-01-13 – 2024-01-18 (×10): 200 mg via ORAL
  Filled 2024-01-13 (×11): qty 1

## 2024-01-13 MED ORDER — EZETIMIBE 10 MG PO TABS
10.0000 mg | ORAL_TABLET | Freq: Every day | ORAL | Status: DC
  Administered 2024-01-14 – 2024-01-18 (×5): 10 mg via ORAL
  Filled 2024-01-13 (×5): qty 1

## 2024-01-13 MED ORDER — FOLIC ACID 1 MG PO TABS
1.0000 mg | ORAL_TABLET | Freq: Every day | ORAL | Status: DC
  Administered 2024-01-14 – 2024-01-18 (×5): 1 mg via ORAL
  Filled 2024-01-13 (×5): qty 1

## 2024-01-13 MED ORDER — ESCITALOPRAM OXALATE 10 MG PO TABS
10.0000 mg | ORAL_TABLET | Freq: Every day | ORAL | Status: DC
  Administered 2024-01-14 – 2024-01-18 (×5): 10 mg via ORAL
  Filled 2024-01-13 (×5): qty 1

## 2024-01-13 MED ORDER — ONDANSETRON HCL 4 MG/2ML IJ SOLN: 4.0000 mg | Freq: Four times a day (QID) | INTRAMUSCULAR | Status: DC | PRN

## 2024-01-13 MED ORDER — VANCOMYCIN HCL 1500 MG/300ML IV SOLN
1500.0000 mg | Freq: Two times a day (BID) | INTRAVENOUS | Status: DC
  Administered 2024-01-14 – 2024-01-16 (×5): 1500 mg via INTRAVENOUS
  Filled 2024-01-13 (×5): qty 300

## 2024-01-13 MED ORDER — VANCOMYCIN HCL 2000 MG/400ML IV SOLN
2000.0000 mg | Freq: Once | INTRAVENOUS | Status: AC
  Administered 2024-01-13: 2000 mg via INTRAVENOUS
  Filled 2024-01-13: qty 400

## 2024-01-13 MED ORDER — APIXABAN 5 MG PO TABS
5.0000 mg | ORAL_TABLET | Freq: Two times a day (BID) | ORAL | Status: DC
  Administered 2024-01-14 – 2024-01-18 (×10): 5 mg via ORAL
  Filled 2024-01-13 (×10): qty 1

## 2024-01-13 MED ORDER — LOSARTAN POTASSIUM 25 MG PO TABS
12.5000 mg | ORAL_TABLET | Freq: Every day | ORAL | Status: DC
  Administered 2024-01-14: 12.5 mg via ORAL
  Filled 2024-01-13 (×2): qty 0.5

## 2024-01-13 MED ORDER — ACETAMINOPHEN 650 MG RE SUPP: 650.0000 mg | Freq: Four times a day (QID) | RECTAL | Status: DC | PRN

## 2024-01-13 MED ORDER — GABAPENTIN 100 MG PO CAPS
200.0000 mg | ORAL_CAPSULE | Freq: Every day | ORAL | Status: DC
  Administered 2024-01-14 – 2024-01-17 (×5): 200 mg via ORAL
  Filled 2024-01-13 (×5): qty 2

## 2024-01-13 MED ORDER — HYDROCODONE-ACETAMINOPHEN 5-325 MG PO TABS
1.0000 | ORAL_TABLET | ORAL | Status: DC | PRN
  Administered 2024-01-14 – 2024-01-18 (×9): 2 via ORAL
  Filled 2024-01-13 (×9): qty 2

## 2024-01-13 MED ORDER — ONDANSETRON HCL 4 MG PO TABS: 4.0000 mg | ORAL_TABLET | Freq: Four times a day (QID) | ORAL | Status: DC | PRN

## 2024-01-13 MED ORDER — METOPROLOL TARTRATE 5 MG/5ML IV SOLN: 5.0000 mg | Freq: Four times a day (QID) | INTRAVENOUS | Status: DC | PRN

## 2024-01-13 MED ORDER — VANCOMYCIN HCL IN DEXTROSE 1-5 GM/200ML-% IV SOLN: 1000.0000 mg | Freq: Once | INTRAVENOUS | Status: DC

## 2024-01-13 MED ORDER — MORPHINE SULFATE (PF) 2 MG/ML IV SOLN
2.0000 mg | INTRAVENOUS | Status: DC | PRN
  Administered 2024-01-13 – 2024-01-15 (×3): 2 mg via INTRAVENOUS
  Filled 2024-01-13 (×3): qty 1

## 2024-01-13 MED ORDER — VANCOMYCIN HCL IN DEXTROSE 1-5 GM/200ML-% IV SOLN
1000.0000 mg | Freq: Once | INTRAVENOUS | Status: AC
  Administered 2024-01-13: 1000 mg via INTRAVENOUS
  Filled 2024-01-13: qty 200

## 2024-01-13 MED ORDER — PREDNISONE 5 MG PO TABS
5.0000 mg | ORAL_TABLET | Freq: Every day | ORAL | Status: DC
  Administered 2024-01-14 – 2024-01-17 (×5): 5 mg via ORAL
  Filled 2024-01-13 (×5): qty 1

## 2024-01-13 NOTE — Assessment & Plan Note (Signed)
 Quite elevated, only on losartan  12.5mg  daily  Make sure cuff is correct PRN metoprolol 

## 2024-01-13 NOTE — Assessment & Plan Note (Addendum)
 She has had to come off Cimzia recently due to infections.  I will continue home meds now - Continue home prednisone  5 mg daily and Plaquenil

## 2024-01-13 NOTE — Assessment & Plan Note (Signed)
 Continue eliquis 

## 2024-01-13 NOTE — Assessment & Plan Note (Signed)
 Resolved, ECG normal, troponin low and flat, rules out ischemia.  Doubt PE on Eliquis .

## 2024-01-13 NOTE — Progress Notes (Signed)
 Pharmacy Antibiotic Note  [name] is a 58 y.o. female admitted on 01/13/2024 with cellulitis/weeping leg wound.  Pharmacy has been consulted for vancomycin  dosing.  Patient already given 1g of IV vanc while in the ED with another 2g pending administration to complete loading dose.   Plan: - Start vancomycin  1500mg  IV q12h (eAUC 535 using Scr 0.99, IBW, and Vd 0.5) - Vanc levels PRN - Monitor cultures, renal function, and overall clinical picture  - De-escalate abx as able   Height: 6' (182.9 cm) Weight: (!) 170 kg (374 lb 12.5 oz) IBW/kg (Calculated) : 73.1  Temp (24hrs), Avg:97.9 F (36.6 C), Min:97.9 F (36.6 C), Max:97.9 F (36.6 C)  Recent Labs  Lab 01/13/24 1913  WBC 5.7  CREATININE 0.99  LATICACIDVEN 1.6    Estimated Creatinine Clearance: 113.4 mL/min (by C-G formula based on SCr of 0.99 mg/dL).    Allergies  Allergen Reactions   Abatacept Anaphylaxis and Other (See Comments)    Orencia: Anaphylaxis-Streptomycin Clickject    Bee Venom Anaphylaxis   Cefadroxil  Diarrhea   Augmentin  [Amoxicillin -Pot Clavulanate] Hives, Itching and Other (See Comments)    Did PCN reaction causing immediate rash, facial/tongue/throat swelling, SOB or lightheadedness with hypotension: yes Did PCN reaction causing severe rash involving mucus membranes or skin necrosis: no Has patient had a PCN reaction that required hospitalization: in hospital Has patient had a PCN reaction occurring within the last 10 years: no If all of the above answers are NO, then may proceed with Cephalosporin use.  Pt reports no recollection of any reactions when taking penicillin in past   Latex Hives and Other (See Comments)    Local reaction (welts). Patient denies any wheezing or other reaction with latex exposure.   Keflex  [Cephalexin ] Diarrhea, Nausea Only and Other (See Comments)    Causes extreme sleepiness, nausea, and diarrhea   Nulytely  [Peg 3350 -Kcl-Na Bicarb-Nacl] Nausea And Vomiting  and Other (See Comments)    NAUSEA AND VOMITING. MAY TOLERATE LOW VOLUME PREP.   Tomato Other (See Comments)    Caused sores in the mouth    Antimicrobials this admission: 11/16 vancomycin  >>   Dose adjustments this admission: N/A  Microbiology results: None collected    Thank you for allowing pharmacy to be a part of this patient's care.  Lacinda Moats, PharmD Clinical Pharmacist  11/16/20259:00 PM

## 2024-01-13 NOTE — ED Provider Notes (Signed)
 Received patient in signout from previous provider pending admission.  See his note.  In short, patient presents Emergency Department for evaluation of left lateral ankle infection that started a week ago.  It is painful with drainage and has odor.  Has been using Silvadene without any improvement.  No fevers chills at home.  She recently started her RA medication and reports symptoms seem to be worsening.  She was hospitalized in the past for this in August receiving IV antibiotics with improvement  Vital signs hemodynamically stable with no tachycardia nor fever.  No signs of systemic infection with no elevated lactic nor leukocytosis.  Was provided Vanco IV for infection.  Consulted hospitalist Dr. Waddell, reviewed ED workup, disposition and accepts patient for admission   Minnie Tinnie BRAVO, PA 01/13/24 2021    Ellouise Fine K, DO 01/13/24 2232

## 2024-01-13 NOTE — ED Notes (Signed)
 Lab draw attempted but was unsuccessful

## 2024-01-13 NOTE — ED Provider Notes (Signed)
 [name] EMERGENCY DEPARTMENT AT [facility] Provider Note   CSN: [id number] Arrival date & time: 01/13/24  1732     Patient presents with: Leg Pain   [name] is a 58 y.o. female.   The history is provided by the patient and medical records. No language interpreter was used.  Leg Pain    [name] is a 57 y.o. female with PMH of HTN, RA, elephantiasis, morbid obesity, hx of sepsis due to cellulitis present to the ED with cc of left lateral ankle infection that started a week ago. Pt states that [name] noticed the infection on her leg a week ago, it is painful, with drainage and has a malodor. Pt reports that [name] had the same issue in the past on the same leg but [name] different location. [name] has been using silvadene without any improvement. Pt denies fever, chills, nausea, vomiting, CP, SOB.  [name] mention her symptoms seems to be getting progressively worse now that [name] is on her rheumatoid arthritis medication.  States [name] was hospitalized back in August for same and received IV antibiotic with improvement of her condition.  Prior to Admission medications   Medication Sig Start Date End Date Taking? Authorizing Provider  albuterol  (VENTOLIN  HFA) 108 (90 Base) MCG/ACT inhaler Inhale 2 puffs into the lungs every 6 (six) hours as needed for wheezing or shortness of breath. 09/14/23   Fleming, [name] W, NP  apixaban  (ELIQUIS ) 5 MG TABS tablet Take 1 tablet (5 mg total) by mouth 2 (two) times daily. 06/13/23   Fleming, [name] W, NP  buPROPion  (WELLBUTRIN  XL) 150 MG 24 hr tablet Take 1 tablet (150 mg total) by mouth daily. 09/07/23   Fleming, [name] W, NP  Cholecalciferol (VITAMIN D3) 50 MCG (2000 UT) TABS Take 2,000 Units by mouth at bedtime.    [provider]  CIMZIA, 2 SYRINGE, 200 MG/ML prefilled syringe Inject 200 mg into the skin every 14 (fourteen) days. Patient not taking: Reported on 10/01/2023    [provider]  escitalopram  (LEXAPRO ) 10 MG  tablet Take 1 tablet (10 mg total) by mouth daily. For depression 06/13/23   Fleming, [name] W, NP  ezetimibe  (ZETIA ) 10 MG tablet Take 1 tablet (10 mg total) by mouth daily. For high cholesterol 06/13/23   Theotis Haze ORN, NP  famotidine  (PEPCID ) 40 MG tablet Take 1 tablet by mouth once daily 08/21/23   Fleming, [name] W, NP  folic acid  (FOLVITE ) 1 MG tablet Take 1 tablet by mouth once daily 12/05/23   Newlin, Enobong, MD  gabapentin  (NEURONTIN ) 100 MG capsule Take 200 mg by mouth at bedtime. 05/31/23 05/30/24  [provider]  losartan  (COZAAR ) 25 MG tablet Take 0.5 tablets (12.5 mg total) by mouth at bedtime. 12/01/22   Thukkani, Arun K, MD  meclizine  (ANTIVERT ) 12.5 MG tablet Take 1-2 tablets (12.5-25 mg total) by mouth 3 (three) times daily as needed for dizziness. 10/11/22   Theotis Haze ORN, NP  Misc. Devices (BARIATRIC ROLLATOR) MISC Please provide patient with insurance approved bariatric rollator walker. I89.0, R53.81, R26.81 11/05/19   Theotis Haze ORN, NP  oxyCODONE  (OXY IR/ROXICODONE ) 5 MG immediate release tablet Take 1 tablet (5 mg total) by mouth every 6 (six) hours as needed for moderate pain (pain score 4-6) or severe pain (pain score 7-10). Patient not taking: Reported on 10/10/2023 09/29/23   Patsy Lenis, MD  predniSONE  (DELTASONE ) 5 MG tablet Take 5-10 mg by mouth daily after supper. 07/19/23  [provider]    Allergies: Abatacept, Bee venom, Cefadroxil , Augmentin  [amoxicillin -pot clavulanate], Latex, Keflex  [cephalexin ], Nulytely  [peg 3350 -kcl-na bicarb-nacl], and Tomato    Review of Systems  All other systems reviewed and are negative.   Updated Vital Signs BP (!) 187/78 (BP Location: Left Arm)   Pulse 97   Temp 97.9 F (36.6 C) (Oral)   Resp 16   Ht 6' (1.829 m)   Wt (!) 170 kg   LMP 01/31/2011   SpO2 100%   BMI 50.83 kg/m   Physical Exam Vitals and nursing note reviewed.  Constitutional:      General: [name] is not in acute distress.    Appearance: [name]  is well-developed. [name] is obese.  HENT:     Head: Atraumatic.  Eyes:     Conjunctiva/sclera: Conjunctivae normal.  Cardiovascular:     Rate and Rhythm: Normal rate and regular rhythm.     Pulses: Normal pulses.     Heart sounds: Normal heart sounds.  Pulmonary:     Effort: Pulmonary effort is normal.  Abdominal:     Palpations: Abdomen is soft.  Musculoskeletal:     Cervical back: Neck supple.     Right lower leg: Edema present.     Left lower leg: Edema present.     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  There is a large macerated skin ulceration noted to the left posterior lower extremity with foul odor as depicted in the picture attached.  Skin:    Findings: No rash.  Neurological:     Mental Status: [name] is alert. Mental status is at baseline.  Psychiatric:        Mood and Affect: Mood normal.        (all labs ordered are listed, but only abnormal results are displayed) Labs Reviewed  COMPREHENSIVE METABOLIC PANEL WITH GFR - Abnormal; Notable for the following components:      Result Value   Glucose, Bld 159 (*)    All other components within normal limits  CBC WITH DIFFERENTIAL/PLATELET - Abnormal; Notable for the following components:   RBC 3.58 (*)    Hemoglobin 10.2 (*)    HCT 32.8 (*)    RDW 16.1 (*)    All other components within normal limits  URINALYSIS, W/ REFLEX TO CULTURE (INFECTION SUSPECTED)  PROCALCITONIN  PROCALCITONIN  I-STAT CG4 LACTIC ACID, ED  I-STAT CG4 LACTIC ACID, ED    EKG: None  Radiology: No results found.   Procedures   Medications Ordered in the ED  vancomycin  (VANCOCIN ) IVPB 1000 mg/200 mL premix (1,000 mg Intravenous New Bag/Given 01/13/24 1942)    Followed by  vancomycin  LUCRECIA) IVPB 2000 mg/400 mL (has no administration in time range)  morphine  (PF) 4 MG/ML injection 4 mg (4 mg Intravenous Given 01/13/24 1941)                                    Medical Decision  Making Amount and/or Complexity of Data Reviewed Labs: ordered.  Risk Prescription drug management.   BP (!) 187/78 (BP Location: Left Arm)   Pulse 97   Temp 97.9 F (36.6 C) (Oral)   Resp 16   Ht 6' (1.829 m)   Wt (!) 170 kg   LMP 01/31/2011   SpO2 100%   BMI 50.83 kg/m   6:51 PM [name] is a 58 y.o. female with PMH  of HTN, RA, elephantiasis, morbid obesity, hx of sepsis due to cellulitis present to the ED with cc of left lateral ankle infection that started a week ago. Pt states that [name] noticed the infection on her leg a week ago, it is painful, with drainage and has a malodor. Pt reports that [name] had the same issue in the past on the same leg but [name] different location. [name] has been using silvadene without any improvement. Pt denies fever, chills, nausea, vomiting, CP, SOB.  [name] mention her symptoms seems to be getting progressively worse now that [name] is on her rheumatoid arthritis medication.  States [name] was hospitalized back in August for same and received IV antibiotic with improvement of her condition.  Exam notable for obvious lymphedema involving bilateral extreme consistent with elephantitis.  Patient also has a large macerated skin lesion to the posterior left tib-fib with granular tissue and strong foul odor concerning for skin infection.  No deep crating wound to suggest osteomyelitis.  -Labs ordered, independently viewed and interpreted by me.  Labs remarkable for normal WBC, normal lactic acid -The patient was maintained on a cardiac monitor.  I personally viewed and interpreted the cardiac monitored which showed an underlying rhythm of: NSR -Imaging including x-ray of left lower extremity or MRI of the tib-fib were considered but not performed as the wound appears to be more superficial. -This patient presents to the ED for concern of leg pain, this involves an extensive number of treatment options, and is a complaint that carries with it a high risk of  complications and morbidity.  The differential diagnosis includes cellulitis, venous stasis ulcer, pressure ulcer, osteomyelitis -Co morbidities that complicate the patient evaluation includes elephantitis, rheumatoid arthritis, hypertension, obesity -Treatment includes vancomycin , morphine  -Reevaluation of the patient after these medicines showed that the patient improved -PCP office notes or outside notes reviewed -Discussion with oncoming provider who will consult medicine for admission for IV treatment of LLE skin infection -Escalation to admission/observation considered: patient is agreeable with admission.       Final diagnoses:  Cellulitis of left leg    ED Discharge Orders     None          Nivia Colon, PA-C 01/13/24 1951    Pamella Ozell LABOR, DO 01/14/24 2325

## 2024-01-13 NOTE — Assessment & Plan Note (Signed)
 Last episode was in July, admitted back to back at that time.  Treated with vancomycin  to Bactrim . - Continue vancomycin  - Elevate leg - Follow-up with lymphedema clinic at discharge

## 2024-01-13 NOTE — Assessment & Plan Note (Signed)
 Hgb baseline: 8-10 Stable Check iron  studies Colonoscopy 2019  Trend

## 2024-01-13 NOTE — H&P (Signed)
 History and Physical    Patient: [name] FMW:[id number] DOB: May 29, 1965 DOA: 01/13/2024 DOS: the patient was seen and examined on 01/13/2024 PCP: Theotis Haze ORN, NP  Patient coming from: Home - lives by herself. Uses walker    Chief Complaint: left leg wound   HPI: [name] is a 58 y.o. female with medical history significant of anxiety and depression, lymphedema, hx of PE, RA on chronic prednisone , HTN who presented to ED with complaints of wound on left leg x 1 week with odor and drainage. She states her leg has had excessive weeping and the odor prompted her to come in to ED. She also states she started to have severe pain in the left leg over the past 2 days and has not been able to really function which is abnormal for her. She also has a raw wound that is new.  No fever/chills. No N/V/D. Eating and drinking well. No abx outpatient. She is followed at [facility] for wound care and lymphedema clinic. She goes to wound care once/month. She also reports chest pain over the past 2 days. It is intermittent in nature. Lasts for a couple of hours. Pain over her right lateral anterior chest.    Denies any fever/chills, vision changes/headaches, palpitations, shortness of breath or cough, abdominal pain, N/V/D, dysuria.    She does not smoke or drink alcohol.   ER Course:  vitals: afebrile, bp: 187/78, HR: 97, RR: 16, oxygen: 100%RA Pertinent labs: hgb: 10.2,  In ED: started on vanc. TRH asked to admit    Review of Systems: As mentioned in the history of present illness. All other systems reviewed and are negative. Past Medical History:  Diagnosis Date   Anemia    Anxiety    Arthritis    Asthma    hx as child - no prob as adult - no inhaler   Blood transfusion 01/22/11   transfusion 2 units at [facility]   Depression 08/2010   psych assessment   Dyspnea    Fibroid    Headache(784.0)    rx for imitrex - last one jan   Keloid    Lymphedema    Pulmonary embolism [facility])     Past Surgical History:  Procedure Laterality Date   ABDOMINAL HYSTERECTOMY     COLONOSCOPY N/A 11/08/2017   Procedure: COLONOSCOPY;  Surgeon: Harvey Margo CROME, MD;  [facility];  Service: Endoscopy;  Laterality: N/A;  2:00pm   FLEXIBLE SIGMOIDOSCOPY N/A 09/17/2017   Procedure: FLEXIBLE SIGMOIDOSCOPY;  Surgeon: Harvey Margo CROME, MD;  Location: [facility];  Service: Endoscopy;  Laterality: N/A;   HERNIA REPAIR  2009   umbilical   POLYPECTOMY  11/08/2017   Procedure: POLYPECTOMY;  Surgeon: Harvey Margo CROME, MD;  Location: [facility];  Service: Endoscopy;;  ascending colon (CSx1), transverse colon (CS x1), splenic flexure (HSx1)   SVD     Spontaneous vaginal delivery; x 1   Social History:  reports that she has never smoked. She has never used smokeless tobacco. She reports that she does not drink alcohol and does not use drugs.  Allergies  Allergen Reactions   Abatacept Anaphylaxis and Other (See Comments)    Orencia: Anaphylaxis-Streptomycin Clickject    Bee Venom Anaphylaxis   Cefadroxil  Diarrhea   Augmentin  [Amoxicillin -Pot Clavulanate] Hives, Itching and Other (See Comments)    Did PCN reaction causing immediate rash, facial/tongue/throat swelling, SOB or lightheadedness with hypotension: yes Did PCN reaction causing severe rash involving mucus membranes  or skin necrosis: no Has patient had a PCN reaction that required hospitalization: in hospital Has patient had a PCN reaction occurring within the last 10 years: no If all of the above answers are NO, then may proceed with Cephalosporin use.  Pt reports no recollection of any reactions when taking penicillin in past   Latex Hives and Other (See Comments)    Local reaction (welts). Patient denies any wheezing or other reaction with latex exposure.   Keflex  [Cephalexin ] Diarrhea, Nausea Only and Other (See Comments)    Causes extreme sleepiness, nausea, and diarrhea   Nulytely  [Peg 3350 -Kcl-Na Bicarb-Nacl] Nausea  And Vomiting and Other (See Comments)    NAUSEA AND VOMITING. MAY TOLERATE LOW VOLUME PREP.   Tomato Other (See Comments)    Caused sores in the mouth    Family History  Problem Relation Age of Onset   Heart disease Mother    Hypertension Mother    Hypertension Father    Colon cancer Father 66       Passed away 64 yrs old   Hypertension Sister    Cancer Maternal Grandmother        gastric cancer   Cancer Maternal Grandfather        pancreatic cancer   Colon cancer Paternal Grandmother    Colon cancer Paternal Uncle    Colon polyps Neg Hx     Prior to Admission medications   Medication Sig Start Date End Date Taking? Authorizing Provider  albuterol  (VENTOLIN  HFA) 108 (90 Base) MCG/ACT inhaler Inhale 2 puffs into the lungs every 6 (six) hours as needed for wheezing or shortness of breath. 09/14/23   Fleming, [name] W, NP  apixaban  (ELIQUIS ) 5 MG TABS tablet Take 1 tablet (5 mg total) by mouth 2 (two) times daily. 06/13/23   Fleming, [name] W, NP  buPROPion  (WELLBUTRIN  XL) 150 MG 24 hr tablet Take 1 tablet (150 mg total) by mouth daily. 09/07/23   Fleming, [name] W, NP  Cholecalciferol (VITAMIN D3) 50 MCG (2000 UT) TABS Take 2,000 Units by mouth at bedtime.    [provider]  CIMZIA, 2 SYRINGE, 200 MG/ML prefilled syringe Inject 200 mg into the skin every 14 (fourteen) days. Patient not taking: Reported on 10/01/2023    [provider]  escitalopram  (LEXAPRO ) 10 MG tablet Take 1 tablet (10 mg total) by mouth daily. For depression 06/13/23   Fleming, [name] W, NP  ezetimibe  (ZETIA ) 10 MG tablet Take 1 tablet (10 mg total) by mouth daily. For high cholesterol 06/13/23   Fleming, [name] W, NP  famotidine  (PEPCID ) 40 MG tablet Take 1 tablet by mouth once daily 08/21/23   Fleming, [name] W, NP  folic acid  (FOLVITE ) 1 MG tablet Take 1 tablet by mouth once daily 12/05/23   Newlin, Enobong, MD  gabapentin  (NEURONTIN ) 100 MG capsule Take 200 mg by mouth at bedtime. 05/31/23 05/30/24  [provider]  losartan  (COZAAR ) 25 MG tablet Take 0.5 tablets (12.5 mg total) by mouth at bedtime. 12/01/22   Thukkani, Arun K, MD  meclizine  (ANTIVERT ) 12.5 MG tablet Take 1-2 tablets (12.5-25 mg total) by mouth 3 (three) times daily as needed for dizziness. 10/11/22   Theotis Haze ORN, NP  Misc. Devices (BARIATRIC ROLLATOR) MISC Please provide patient with insurance approved bariatric rollator walker. I89.0, R53.81, R26.81 11/05/19   Fleming, [name] W, NP  oxyCODONE  (OXY IR/ROXICODONE ) 5 MG immediate release tablet Take 1 tablet (5 mg total) by mouth every 6 (six) hours as needed  for moderate pain (pain score 4-6) or severe pain (pain score 7-10). Patient not taking: Reported on 10/10/2023 09/29/23   Patsy Lenis, MD  predniSONE  (DELTASONE ) 5 MG tablet Take 5-10 mg by mouth daily after supper. 07/19/23   [provider]    Physical Exam: Vitals:   01/13/24 1742 01/13/24 1750  BP: (!) 187/78   Pulse: 97   Resp: 16   Temp: 97.9 F (36.6 C)   TempSrc: Oral   SpO2: 100%   Weight:  (!) 170 kg  Height:  6' (1.829 m)   General:  Appears calm and comfortable and is in NAD. Morbidly obese  Eyes:  PERRL, EOMI, normal lids, iris ENT:  grossly normal hearing, lips & tongue, mmm; appropriate dentition Neck:  no LAD, masses or thyromegaly; no carotid bruits Cardiovascular:  RRR, +systolic murmur. Respiratory:   CTA bilaterally with no wheezes/rales/rhonchi.  Normal respiratory effort. Abdomen:  soft, NT, ND, NABS Back:   normal alignment, no CVAT Skin:  no rash or induration seen on limited exam. See LE exam below for wound  Musculoskeletal:  grossly normal tone BUE/BLE, good ROM, no bony abnormality Lower extremity:  bilateral lower extremity edema/lymphedema. Severe. Left lower leg with open and raw wound on posterior/medial aspect with foul smell. Tender around this area   Psychiatric:  grossly normal mood and affect, speech fluent and appropriate, AOx3 Neurologic:  CN 2-12 grossly  intact, moves all extremities in coordinated fashion, sensation intact   Radiological Exams on Admission: Independently reviewed - see discussion in A/P where applicable  No results found.  EKG: pending    Labs on Admission: I have personally reviewed the available labs and imaging studies at the time of the admission.  Pertinent labs:   Hgb 10.2   Assessment and Plan: Principal Problem:   Cellulitis of left lower leg Active Problems:   Normocytic anemia   Lymphedema   Atypical chest pain   History of pulmonary embolus (PE)   Rheumatoid arthritis (HCC)   Essential hypertension   Anxiety and depression    Assessment and Plan: * Cellulitis of left lower leg 58 year old with known bilateral lower extremity lymphedema presenting with 1 week history of wound, drainage, odor and pain from her left lower leg concerning for cellulitis  -obs to tele  -she has no SIRS criteria -check PCT, lactic acid wnl  -wound with foul smell and drainage -consult wound care  -continue vancomycin  for now, can likely de-escalate  -pain control  -admitted this summer for sepsis from LLE cellulitis  -followed at [facility] wound care/lymphedema clinic   Normocytic anemia Hgb baseline: 8-10 Stable Check iron  studies Colonoscopy 2019  Trend   Atypical chest pain Pain over right, lateral side of chest She is pain free right now Check EKG and troponin Monitor on tele x 24 hours   Lymphedema Wound care consult Followed at [facility] lymphedema clinic  History of pulmonary embolus (PE) Continue eliquis    Rheumatoid arthritis (HCC) Continue prednisone  5mg  daily  Continue plaquenil BID   Essential hypertension Quite elevated, only on losartan  12.5mg  daily  Make sure cuff is correct PRN metoprolol    Anxiety and depression Continue wellbutrin  and lexapro      Advance Care Planning:   Code Status: Full Code    Consultants: None    Procedures: None    Antibiotics: Vancomycin   11/16     DVT Prophylaxis: eliquis    Family Communication: none   Severity of Illness: The appropriate patient status for this  patient is OBSERVATION. Observation status is judged to be reasonable and necessary in order to provide the required intensity of service to ensure the patient's safety. The patient's presenting symptoms, physical exam findings, and initial radiographic and laboratory data in the context of their medical condition is felt to place them at decreased risk for further clinical deterioration. Furthermore, it is anticipated that the patient will be medically stable for discharge from the hospital within 2 midnights of admission.   Author: Isaiah Geralds, MD 01/13/2024 8:55 PM  For on call review [email or web address].

## 2024-01-13 NOTE — Assessment & Plan Note (Signed)
 Wound care consult Followed at [facility] lymphedema clinic

## 2024-01-13 NOTE — Assessment & Plan Note (Signed)
 Continue bupropion and citalopram.

## 2024-01-13 NOTE — ED Triage Notes (Signed)
 Patient to ED by POV with c/o infection to L leg. Reports she has noticed odor, pain and drainage x1 week. She denies N/V/fever or chills. No ABX

## 2024-01-14 DIAGNOSIS — Z7901 Long term (current) use of anticoagulants: Secondary | ICD-10-CM | POA: Diagnosis not present

## 2024-01-14 DIAGNOSIS — Z6841 Body Mass Index (BMI) 40.0 and over, adult: Secondary | ICD-10-CM | POA: Diagnosis not present

## 2024-01-14 DIAGNOSIS — Z888 Allergy status to other drugs, medicaments and biological substances status: Secondary | ICD-10-CM | POA: Diagnosis not present

## 2024-01-14 DIAGNOSIS — M069 Rheumatoid arthritis, unspecified: Secondary | ICD-10-CM | POA: Diagnosis not present

## 2024-01-14 DIAGNOSIS — Z880 Allergy status to penicillin: Secondary | ICD-10-CM | POA: Diagnosis not present

## 2024-01-14 DIAGNOSIS — Z91040 Latex allergy status: Secondary | ICD-10-CM | POA: Diagnosis not present

## 2024-01-14 DIAGNOSIS — Z90710 Acquired absence of both cervix and uterus: Secondary | ICD-10-CM | POA: Diagnosis not present

## 2024-01-14 DIAGNOSIS — Z7952 Long term (current) use of systemic steroids: Secondary | ICD-10-CM | POA: Diagnosis not present

## 2024-01-14 DIAGNOSIS — Z79899 Other long term (current) drug therapy: Secondary | ICD-10-CM | POA: Diagnosis not present

## 2024-01-14 DIAGNOSIS — Z91030 Bee allergy status: Secondary | ICD-10-CM | POA: Diagnosis not present

## 2024-01-14 DIAGNOSIS — I890 Lymphedema, not elsewhere classified: Secondary | ICD-10-CM | POA: Diagnosis not present

## 2024-01-14 DIAGNOSIS — F32A Depression, unspecified: Secondary | ICD-10-CM | POA: Diagnosis not present

## 2024-01-14 DIAGNOSIS — Z8249 Family history of ischemic heart disease and other diseases of the circulatory system: Secondary | ICD-10-CM | POA: Diagnosis not present

## 2024-01-14 DIAGNOSIS — J45909 Unspecified asthma, uncomplicated: Secondary | ICD-10-CM | POA: Diagnosis not present

## 2024-01-14 DIAGNOSIS — Z751 Person awaiting admission to adequate facility elsewhere: Secondary | ICD-10-CM | POA: Diagnosis not present

## 2024-01-14 DIAGNOSIS — D649 Anemia, unspecified: Secondary | ICD-10-CM | POA: Diagnosis not present

## 2024-01-14 DIAGNOSIS — Z86711 Personal history of pulmonary embolism: Secondary | ICD-10-CM | POA: Diagnosis not present

## 2024-01-14 DIAGNOSIS — I10 Essential (primary) hypertension: Secondary | ICD-10-CM | POA: Diagnosis not present

## 2024-01-14 DIAGNOSIS — Z881 Allergy status to other antibiotic agents status: Secondary | ICD-10-CM | POA: Diagnosis not present

## 2024-01-14 DIAGNOSIS — I5A Non-ischemic myocardial injury (non-traumatic): Secondary | ICD-10-CM | POA: Diagnosis not present

## 2024-01-14 DIAGNOSIS — E7800 Pure hypercholesterolemia, unspecified: Secondary | ICD-10-CM | POA: Diagnosis not present

## 2024-01-14 DIAGNOSIS — L03116 Cellulitis of left lower limb: Secondary | ICD-10-CM | POA: Diagnosis not present

## 2024-01-14 DIAGNOSIS — F419 Anxiety disorder, unspecified: Secondary | ICD-10-CM | POA: Diagnosis not present

## 2024-01-14 LAB — BASIC METABOLIC PANEL WITH GFR
Anion gap: 8 (ref 5–15)
BUN: 10 mg/dL (ref 6–20)
CO2: 25 mmol/L (ref 22–32)
Calcium: 9.3 mg/dL (ref 8.9–10.3)
Chloride: 106 mmol/L (ref 98–111)
Creatinine, Ser: 0.84 mg/dL (ref 0.44–1.00)
GFR, Estimated: >60 mL/min (ref >60)
Glucose, Bld: 117 mg/dL — ABNORMAL HIGH (ref 70–99)
Potassium: 4.4 mmol/L (ref 3.5–5.1)
Sodium: 140 mmol/L (ref 135–145)

## 2024-01-14 LAB — PROCALCITONIN: Procalcitonin: <0.1 ng/mL

## 2024-01-14 LAB — CBC
HCT: 29.1 % — ABNORMAL LOW (ref 36.0–46.0)
Hemoglobin: 8.7 g/dL — ABNORMAL LOW (ref 12.0–15.0)
MCH: 28 pg (ref 26.0–34.0)
MCHC: 29.9 g/dL — ABNORMAL LOW (ref 30.0–36.0)
MCV: 93.6 fL (ref 80.0–100.0)
Platelets: 249 K/uL (ref 150–400)
RBC: 3.11 MIL/uL — ABNORMAL LOW (ref 3.87–5.11)
RDW: 16.1 % — ABNORMAL HIGH (ref 11.5–15.5)
WBC: 4.7 K/uL (ref 4.0–10.5)
nRBC: 0 % (ref 0.0–0.2)

## 2024-01-14 NOTE — Patient Outreach (Signed)
 Received call from Calton Flight, PT requesting assistance getting patient set up with [facility] aide due to concerns related to arthritis making it hard to attend to ADLs. Note per chart review that patient is currently hospitalized for cellulitis of the left lower leg. No indication at this time per chart review if patient with be discharged home with home health. Will allow time for inpatient hospital staff to assess home health needs, and will plan for care team to reach out to patient following discharge to assess further.  Rosaline Finlay, RN MSN [place]  [facility] Population Health RN Care Manager Direct Dial: [phone]  Fax: [phone]

## 2024-01-14 NOTE — Plan of Care (Signed)

## 2024-01-14 NOTE — Hospital Course (Addendum)
 Brief Narrative:   58 y.o. F with MO, lymphedema, RA on chronic pred 5 and HCQ, HTN, history of C. difficile, and hx VTE on Eliquis  who presented with left lower leg soft tissue pain and foul-smelling drainage from a posterior LLL wound.  Patient is currently on IV vancomycin .  Physical therapy has recommended SNF but patient opted to go home with [facility] due to her SNF option.  HH was arranged for her Changing her to PO Bactrim  from IV Vanc.   Assessment & Plan:  Cellulitis of left lower leg Last episode was in July, admitted back to back at that time.  Treated with vancomycin  to Bactrim . Currently will plan on continuing IV vancomycin  thereafter transition to p.o. Bactrim  at the time of discharge to complete total 10-day course.       Normocytic anemia Hemoglobin appears to be in the 8-10 range, stable in the range now, iron  studies here are unremarkable   Atypical chest pain Myocardial injury due to infection Resolved, ECG normal, troponin low and flat, infarction ruled out, ischemia ruled out.  Doubt PE on Eliquis .   Lymphedema Followed at [facility] lymphedema clinic   History of pulmonary embolus (PE) - Continue apixaban    Rheumatoid arthritis (HCC) She has had to come off Cimzia recently due to infections.  I will continue home meds now - Continue home prednisone  5 mg daily and Plaquenil     Essential hypertension Resume home medications   Anxiety and depression - Continue bupropion  and citalopram   Morbid obesity (HCC)     DVT prophylaxis: apixaban  (ELIQUIS ) tablet 5 mg     Code Status: Full Code Family Communication:   Status is: Inpatient Remains inpatient appropriate because: dc    PT Follow up Recs: Skilled Nursing-Short Term Rehab (<3 Hours/Day)01/15/2024 [phone]  Subjective: Prefers to go home with [facility] No other ocmplaints.   Examination:  General exam: Appears calm and comfortable  Respiratory system: Clear to auscultation. Respiratory effort  normal. Cardiovascular system: S1 & S2 heard, RRR. No JVD, murmurs, rubs, gallops or clicks. No pedal edema. Gastrointestinal system: Abdomen is nondistended, soft and nontender. No organomegaly or masses felt. Normal bowel sounds heard. Central nervous system: Alert and oriented. No focal neurological deficits. Extremities: Symmetric 5 x 5 power. Skin: Bilateral lower extremity chronic skin changes with significant swelling Psychiatry: Judgement and insight appear normal. Mood & affect appropriate.

## 2024-01-14 NOTE — TOC Initial Note (Addendum)
 Transition of Care [facility]) - Initial/Assessment Note    Patient Details  Name: [name] MRN: [id number] Date of Birth: 03/24/1965  [facility] CM/SW Contact:    Alfonse JONELLE Rex, RN Phone Number: 01/14/2024, 3:10 PM  Clinical Narrative:   Patient admitted with c/o left leg wound pain and drainage, patient reports she isvfollowed at [facility] for wound care and lymphedema clinic,she goes to wound care once/month.   Wound care consulted. Patient resides in an apartment, has a PCP and insurance on file, has family contact on file.  PT eval pendng, await recommendation. INPT CM will follow for dc needs.              Expected Discharge Plan: Home w Home Health Services Barriers to Discharge: Continued Medical Work up   Patient Goals and CMS Choice Patient states their goals for this hospitalization and ongoing recovery are:: return home          Expected Discharge Plan and Services       Living arrangements for the past 2 months: Apartment                                      Prior Living Arrangements/Services Living arrangements for the past 2 months: Apartment Lives with:: Self Patient language and need for interpreter reviewed:: Yes        Need for Family Participation in Patient Care: Yes (Comment) Care giver support system in place?: Yes (comment) Current home services: DME (RW) Criminal Activity/Legal Involvement Pertinent to Current Situation/Hospitalization: No - Comment as needed  Activities of Daily Living   ADL Screening (condition at time of admission) Independently performs ADLs?: Yes (appropriate for developmental age) Is the patient deaf or have difficulty hearing?: No Does the patient have difficulty seeing, even when wearing glasses/contacts?: No Does the patient have difficulty concentrating, remembering, or making decisions?: No  Permission Sought/Granted                  Emotional Assessment       Orientation: :  Oriented to Self, Oriented to Place, Oriented to  Time Alcohol / Substance Use: Not Applicable Psych Involvement: No (comment)  Admission diagnosis:  Cellulitis of left leg [L03.116] Cellulitis of left lower leg [L03.116] Patient Active Problem List   Diagnosis Date Noted   Anxiety and depression 01/13/2024   Cellulitis of left lower leg 01/13/2024   Atypical chest pain 01/13/2024   Pressure injury of skin 09/26/2023   Cellulitis 12/06/2022   AKI (acute kidney injury) 11/07/2022   Murmur, cardiac 11/07/2022   Essential hypertension 11/07/2022   Elevated bilirubin 11/07/2022   Rheumatoid arthritis (HCC) 03/06/2022   Lymphedema 03/06/2022   Morbid obesity (HCC) 09/14/2021   Chest pain 04/07/2021   Screening for malignant neoplasm of colon 03/09/2021   Constipation 03/09/2021   Normocytic anemia 03/09/2021   Body mass index (BMI) 45.0-49.9, adult (HCC) 08/20/2020   Chronic fatigue syndrome 08/20/2020   Polyarthralgia 08/20/2020   Vitamin D  deficiency 08/20/2020   Sepsis due to cellulitis (HCC) 12/31/2018   Elephantiasis 12/31/2018   Ischemic chest pain 12/31/2018   History of pulmonary embolus (PE) 12/31/2018   Physical deconditioning 08/27/2018   Adenopathy 09/15/2017   Family history of colon cancer 08/01/2017   Taking multiple medications for chronic disease 08/01/2017   Axillary lymphadenopathy 01/26/2017   Urinary incontinence 01/25/2017   Hypokalemia 01/25/2017   Lobar  pneumonia 01/25/2017   Pressure ulcer of sacral region, stage 2 (HCC) 01/25/2017   Abnormal ECG 01/29/2013   Dyspnea    Menorrhagia with regular cycle 02/22/2011   Anemia 02/22/2011   PCP:  Theotis Haze ORN, NP Pharmacy:   [facility] [address], [place] - [address] [place] [address] Phone: [phone] Fax: [phone]  [facility] - [facility] [address] [place] [address] Phone: [phone] Fax:  [phone]     Social Drivers of Health (SDOH) Social History: SDOH Screenings   Food Insecurity: Food Insecurity Present (01/13/2024)  Housing: High Risk (01/13/2024)  Transportation Needs: Unmet Transportation Needs (01/13/2024)  Utilities: Not At Risk (01/13/2024)  Alcohol Screen: Low Risk  (10/30/2020)  Depression (PHQ2-9): Low Risk  (10/24/2023)  Recent Concern: Depression (PHQ2-9) - High Risk (09/14/2023)  Financial Resource Strain: Low Risk  (05/14/2023)  Physical Activity: Insufficiently Active (12/06/2022)  Social Connections: Moderately Integrated (12/06/2022)  Stress: No Stress Concern Present (12/06/2022)  Tobacco Use: Low Risk  (09/25/2023)  Health Literacy: Adequate Health Literacy (12/06/2022)   SDOH Interventions:     Readmission Risk Interventions    01/14/2024    3:09 PM  Readmission Risk Prevention Plan  Post Dischage Appt Complete  Medication Screening Complete  Transportation Screening Complete

## 2024-01-14 NOTE — Progress Notes (Signed)
  Progress Note   Patient: [name] FMW:[id number] DOB: [birth year] DOA: 01/13/2024     0 DOS: the patient was seen and examined on 01/14/2024 at 10:11 AM      Brief hospital course: 58 y.o. F with MO, lymphedema, RA on chronic pred 5 and HCQ, HTN, history of C. difficile, and hx VTE on Eliquis  who presented with left lower leg soft tissue pain and foul-smelling drainage from a posterior LLL wound.     Assessment and Plan: * Cellulitis of left lower leg Last episode was in July, admitted back to back at that time.  Treated with vancomycin  to Bactrim . - Continue vancomycin  - Elevate leg - Follow-up with lymphedema clinic at discharge     Normocytic anemia Hemoglobin appears to be in the 8-10 range, stable in the range now, iron  studies here are unremarkable  Atypical chest pain Resolved, ECG normal, troponin low and flat, rules out ischemia.  Doubt PE on Eliquis .  Lymphedema Followed at [facility] lymphedema clinic  History of pulmonary embolus (PE) - Continue apixaban   Rheumatoid arthritis (HCC) She has had to come off Cimzia recently due to infections.  I will continue home meds now - Continue home prednisone  5 mg daily and Plaquenil   Essential hypertension BP soft - Hold losartan    Anxiety and depression - Continue bupropion  and citalopram  Morbid obesity (HCC)            Subjective: Patient has had no change in her pain, she has had no fever overnight, she has no confusion, respiratory distress, joint pains, hand swelling     Physical Exam: BP (!) 101/51 (BP Location: Right Arm)   Pulse 90   Temp 98.4 F (36.9 C) (Oral)   Resp 20   Ht 6' (1.829 m)   Wt (!) 170 kg   LMP 01/31/2011   SpO2 100%   BMI 50.83 kg/m   Adult female, lying in bed, interactive and appropriate RRR, no murmurs, JVP not visible Respiratory rate normal, lungs clear without rales or wheezes Skin of bilateral lower legs is hyperkeratotic, indurated from chronic  venous stasis change, elephantiasis.  On the back of the left lower leg there is a palm sized ulcer with foul-smelling greenish-brownish drainage, underlying tenderness of the soft tissues from the left calf through the left foot. Attention normal, affect appropriate, judgment and insight appear normal, face symmetric, speech fluent    Data Reviewed: Basic metabolic panel shows normal electrolytes and renal function CBC shows no leukocytosis, hemoglobin down to 8.7    Family Communication:     Disposition: Status is: Inpatient         Author: Lonni SHAUNNA Dalton, MD 01/14/2024 11:32 AM  For on call review [email or web address].

## 2024-01-14 NOTE — Plan of Care (Signed)

## 2024-01-14 NOTE — Progress Notes (Signed)
 Dressing change completed to LLE wound. Pt tolerated with no problem.

## 2024-01-14 NOTE — Consult Note (Signed)
 WOC Nurse Consult Note: patient with known history of lymphedema followed by [facility] PT clinic for ongoing manual therapy and wound care; last seen there 01/01/2024; utilizing silver to wound and compression stockings  Reason for Consult: lymphedema with draining wound L posterior calf  Wound type: full thickness L posterior lower leg r/t venous insufficiency  Pressure Injury POA: NA not pressure  Measurement: see nursing flowsheet  Wound bed:25% red 75% tan  Drainage (amount, consistency, odor) purulent foul smelling per notes  Periwound: edema and changes consistent with lymphedema; patient is being treated for cellulitis  Dressing procedure/placement/frequency: Cleanse L lower leg wound with Vashe wound cleanser, do not rinse and allow to air dry.  Apply silver hydrofiber ([place] [phone]) to wound bed daily, cover with ABD pad and secure with Kerlix roll gauze wrapped from right above toes to right below knee.  Apply Ace bandage wrapped in same fashion as Kerlix for light compression.   Lymphedema is outside the scope of the WOC nurse. Patient should continue to follow with [facility] PT for ongoing management of lymphedema/wounds.     POC discussed with bedside nurse. WOC team will not follow. Re-consult if further needs arise.   Thank you,    Kitt Ledet MSN, RN-BC, CWOCN

## 2024-01-15 ENCOUNTER — Encounter (HOSPITAL_COMMUNITY): Payer: Self-pay | Admitting: Family Medicine

## 2024-01-15 DIAGNOSIS — L03116 Cellulitis of left lower limb: Secondary | ICD-10-CM | POA: Diagnosis not present

## 2024-01-15 LAB — MISC LABCORP TEST (SEND OUT): Labcorp test code: 83935

## 2024-01-15 MED ORDER — IPRATROPIUM-ALBUTEROL 0.5-2.5 (3) MG/3ML IN SOLN
3.0000 mL | Freq: Four times a day (QID) | RESPIRATORY_TRACT | Status: DC | PRN
  Administered 2024-01-16: 3 mL via RESPIRATORY_TRACT
  Filled 2024-01-15: qty 3

## 2024-01-15 NOTE — Progress Notes (Signed)
  Progress Note   Patient: [name] FMW:[id number] DOB: February 01, 1966 DOA: 01/13/2024     1 DOS: the patient was seen and examined on 01/15/2024 at 8:27 AM      Brief hospital course: 58 y.o. F with MO, lymphedema, RA on chronic pred 5 and HCQ, HTN, history of C. difficile, and hx VTE on Eliquis  who presented with left lower leg soft tissue pain and foul-smelling drainage from a posterior LLL wound.     Assessment and Plan: * Cellulitis of left lower leg Last episode was in July, admitted back to back at that time.  Treated with vancomycin  to Bactrim . - Continue vancomycin  - Elevate leg - Follow-up with lymphedema clinic at discharge     Normocytic anemia Hemoglobin appears to be in the 8-10 range, stable in the range now, iron  studies here are unremarkable  Atypical chest pain Myocardial injury due to infection Resolved, ECG normal, troponin low and flat, infarction ruled out, ischemia ruled out.  Doubt PE on Eliquis .  Lymphedema Followed at [facility] lymphedema [facility]  History of pulmonary embolus (PE) - Continue apixaban   Rheumatoid arthritis (HCC) She has had to come off Cimzia recently due to infections.  I will continue home meds now - Continue home prednisone  5 mg daily and Plaquenil   Essential hypertension BP soft - Hold losartan    Anxiety and depression - Continue bupropion  and citalopram  Morbid obesity (HCC)            Subjective: Patient is feeling okay, she is reading a book.  She has no fever, [name], her pain is slightly better.     Physical Exam: BP (!) 141/84 (BP Location: Right Arm)   Pulse 71   Temp 97.8 F (36.6 C) (Oral)   Resp 12   Ht 6' (1.829 m)   Wt (!) 170 kg   LMP 01/31/2011   SpO2 98%   BMI 50.83 kg/m   Adult female, lying in bed, interactive and appropriate RRR, no murmurs, JVP not visible Respiratory rate normal, lungs clear without rales or wheezes Hyperkeratosis of the lower extremities, the left leg is  bandaged today, bandages not taken down, elephantiasis, dressing clean dry and intact Attention normal, affect pleasant, judgment and insight normal, speech fluent, face symmetric   Data Reviewed: Basic metabolic panel shows normal electrolytes and renal function CBC shows mild anemia    Family Communication: None    Disposition: Status is: Inpatient 58 yo F with cellulitis in setting of elephantiasis.    Will need few days IV antibitoics, likely to SNF later this week to complete antibiotic course and rehab.  Elevation of leg is key for recovery, but patient not able to due to weight        Author: Lonni SHAUNNA Dalton, MD 01/15/2024 2:56 PM  For on call review [email or web address].

## 2024-01-15 NOTE — Evaluation (Signed)
 Occupational Therapy Evaluation Patient Details Name: [name] MRN: [id number] DOB: 04-22-1965 Today's Date: 01/15/2024   History of Present Illness   58 yo female  presents to Ed 01/13/24  with increased LE edema and pain and drainage from the LLE wound .PMH: bilateral leg lymohademea(follows with PT). EFY:Izemzddpnw, anemia, anxiety, PE, rheumatoid arthritis, antiphospholipid, syndrome, chronic lymphedema, chronic LE wounds     Clinical Impressions PTA, patient lives at home and was mod I with DME/AE and rollator except sponge bathed due to tub shower but apt complex has since put in walk in shower, daughter assists with transportation. Currently, patient presents with deficits outlined below (see OT Problem List for details) most significantly pain, edema, decreased muscle strength, activity tolerance, balance and functional reach with increased body habitus limiting functional mobility and ADL performance. Patient will benefit from continued inpatient follow up therapy, <3 hours/day. Patient requires continued Acute care hospital level OT services to progress safety and functional performance and allow for discharge.       If plan is discharge home, recommend the following:   Two people to help with walking and/or transfers;A lot of help with bathing/dressing/bathroom;Assistance with cooking/housework;Assist for transportation;Help with stairs or ramp for entrance     Functional Status Assessment   Patient has had a recent decline in their functional status and demonstrates the ability to make significant improvements in function in a reasonable and predictable amount of time.     Equipment Recommendations   Tub/shower seat      Precautions/Restrictions   Precautions Precautions: Fall Precaution/Restrictions Comments: drainge from legs Restrictions Weight Bearing Restrictions Per Provider Order: No     Mobility Bed Mobility Overal bed mobility: Modified  Independent             General bed mobility comments: extra  time, use of belt to self move LLE    Transfers Overall transfer level: Needs assistance Equipment used: Rollator (4 wheels) Transfers: Sit to/from Stand, Bed to chair/wheelchair/BSC Sit to Stand: Contact guard assist     Step pivot transfers: Min assist     General transfer comment: watch rollator brake is broken, used adapted velcro shoes for better footing      Balance Overall balance assessment: Mild deficits observed, not formally tested                                         ADL either performed or assessed with clinical judgement   ADL Overall ADL's : Needs assistance/impaired Eating/Feeding: Independent   Grooming: Wash/dry hands;Wash/dry face;Set up   Upper Body Bathing: Contact guard assist;Sitting   Lower Body Bathing: Maximal assistance;Sit to/from stand   Upper Body Dressing : Contact guard assist;Sitting   Lower Body Dressing: Maximal assistance;Sitting/lateral leans     Toilet Transfer Details (indicate cue type and reason): needs bari commode for safety Toileting- Clothing Manipulation and Hygiene: Maximal assistance;Sitting/lateral lean Toileting - Clothing Manipulation Details (indicate cue type and reason): purewick     Functional mobility during ADLs: Minimal assistance;Rollator (4 wheels) General ADL Comments: body habitus and increased assist for functional reach     Vision Baseline Vision/History: 1 Wears glasses;2 Legally blind (L eye) Ability to See in Adequate Light: 2 Moderately impaired Patient Visual Report: No change from baseline              Pertinent Vitals/Pain Pain Assessment Pain Assessment: Faces Faces Pain Scale:  Hurts little more Pain Location: LLE, joint pain Pain Descriptors / Indicators: Discomfort, Guarding, Grimacing Pain Intervention(s): Limited activity within patient's tolerance, Monitored during session, Premedicated before  session, Repositioned     Extremity/Trunk Assessment Upper Extremity Assessment Upper Extremity Assessment: Right hand dominant;Generalized weakness (baseline hx of RA)   Lower Extremity Assessment Lower Extremity Assessment: Defer to PT evaluation   Cervical / Trunk Assessment Cervical / Trunk Assessment: Other exceptions Cervical / Trunk Exceptions: body habitus   Communication Communication Communication: No apparent difficulties   Cognition Arousal: Alert Behavior During Therapy: WFL for tasks assessed/performed Cognition: No family/caregiver present to determine baseline             OT - Cognition Comments: reports mild intermittent fogginess                 Following commands: Intact       Cueing  General Comments   Cueing Techniques: Verbal cues  L LE edema with wraping in place and intact with +3-4 edema, +2-3 edema R LE no open regions noted           Home Living Family/patient expects to be discharged to:: Private residence Living Arrangements: Alone Available Help at Discharge: Family;Available PRN/intermittently Type of Home: Apartment Home Access: Level entry     Home Layout: One level     Bathroom Shower/Tub: Walk-in Pensions Consultant: Standard Bathroom Accessibility: Yes How Accessible: Accessible via walker Home Equipment: Rollator (4 wheels);BSC/3in1;Shower seat;Adaptive equipment Adaptive Equipment: Reacher Additional Comments: gait belt to lift legs, reports dizziness in past and has fallen      Prior Functioning/Environment Prior Level of Function : Independent/Modified Independent             Mobility Comments: household distances daily, limited community distances when feeling well ADLs Comments: bird baths because difficulty with getting into tub shower but now complex has put in walk in shower new, manages medications, daughter takes her on errands    OT Problem List: Decreased strength;Decreased  range of motion;Decreased activity tolerance;Impaired balance (sitting and/or standing);Cardiopulmonary status limiting activity;Obesity;Pain;Increased edema   OT Treatment/Interventions: Self-care/ADL training;Therapeutic exercise;Neuromuscular education;Energy conservation;DME and/or AE instruction;Therapeutic activities;Patient/family education;Balance training      OT Goals(Current goals can be found in the care plan section)   Acute Rehab OT Goals Patient Stated Goal: to get stronger OT Goal Formulation: With patient Time For Goal Achievement: 01/29/24 Potential to Achieve Goals: Good ADL Goals Pt Will Perform Lower Body Bathing: with adaptive equipment;sit to/from stand;with min assist Pt Will Perform Lower Body Dressing: with min assist;with adaptive equipment;sit to/from stand Pt Will Transfer to Toilet: with min assist;bedside commode;ambulating Pt Will Perform Toileting - Clothing Manipulation and hygiene: with min assist;with adaptive equipment;sitting/lateral leans Pt Will Perform Tub/Shower Transfer: Shower transfer;shower seat;with min assist   OT Frequency:  Min 2X/week       AM-PAC OT 6 Clicks Daily Activity     Outcome Measure Help from another person eating meals?: None Help from another person taking care of personal grooming?: A Little Help from another person toileting, which includes using toliet, bedpan, or urinal?: A Lot Help from another person bathing (including washing, rinsing, drying)?: A Lot Help from another person to put on and taking off regular upper body clothing?: A Little Help from another person to put on and taking off regular lower body clothing?: A Lot 6 Click Score: 16   End of Session Equipment Utilized During Treatment: Gait belt;Rollator (4 wheels) Nurse Communication: Mobility  status;Other (comment) (need for bari commode)  Activity Tolerance: Patient tolerated treatment well Patient left: in bed;with call bell/phone within  reach;with bed alarm set  OT Visit Diagnosis: Unsteadiness on feet (R26.81);Muscle weakness (generalized) (M62.81);Pain Pain - part of body:  (B LEand joints)                Time: 8441-8374 OT Time Calculation (min): 27 min Charges:  OT General Charges $OT Visit: 1 Visit OT Evaluation $OT Eval Low Complexity: 1 Low OT Treatments $Self Care/Home Management : 8-22 mins  Courtnie Brenes OT/L Acute Rehabilitation Department  [phone]  01/15/2024, 5:08 PM

## 2024-01-15 NOTE — [place] FL2 (Signed)
 [place]  MEDICAID [id number] LEVEL OF CARE FORM     IDENTIFICATION  Patient Name: [name] Birthdate: February 24, 1966 Sex: female Admission Date (Current Location): 01/13/2024  [facility] and [place] Number:  Producer, Television/film/video and Address:  [facility],  [address] [place], [place] [address]      Provider Number: [id number]  Attending Physician Name and Address:  Jonel Lonni SQUIBB, *  Relative Name and Phone Number:  Arizbeth, Cawthorn (Mother)  [phone]    Current Level of Care: Hospital Recommended Level of Care: Skilled Nursing Facility Prior Approval Number:    Date Approved/Denied:   PASRR Number: Pending  Discharge Plan: SNF    Current Diagnoses: Patient Active Problem List   Diagnosis Date Noted   Anxiety and depression 01/13/2024   Cellulitis of left lower leg 01/13/2024   Atypical chest pain 01/13/2024   Pressure injury of skin 09/26/2023   Cellulitis 12/06/2022   AKI (acute kidney injury) 11/07/2022   Murmur, cardiac 11/07/2022   Essential hypertension 11/07/2022   Elevated bilirubin 11/07/2022   Rheumatoid arthritis (HCC) 03/06/2022   Lymphedema 03/06/2022   Morbid obesity (HCC) 09/14/2021   Chest pain 04/07/2021   Screening for malignant neoplasm of colon 03/09/2021   Constipation 03/09/2021   Normocytic anemia 03/09/2021   Body mass index (BMI) 45.0-49.9, adult (HCC) 08/20/2020   Chronic fatigue syndrome 08/20/2020   Polyarthralgia 08/20/2020   Vitamin D  deficiency 08/20/2020   Sepsis due to cellulitis (HCC) 12/31/2018   Elephantiasis 12/31/2018   Ischemic chest pain 12/31/2018   History of pulmonary embolus (PE) 12/31/2018   Physical deconditioning 08/27/2018   Adenopathy 09/15/2017   Family history of colon cancer 08/01/2017   Taking multiple medications for chronic disease 08/01/2017   Axillary lymphadenopathy 01/26/2017   Urinary incontinence 01/25/2017   Hypokalemia 01/25/2017   Lobar pneumonia 01/25/2017   Pressure  ulcer of sacral region, stage 2 (HCC) 01/25/2017   Abnormal ECG 01/29/2013   Dyspnea    Menorrhagia with regular cycle 02/22/2011   Anemia 02/22/2011    Orientation RESPIRATION BLADDER Height & Weight     Time, Situation, Place  Normal Continent Weight: (!) 374 lb 12.5 oz (170 kg) Height:  6' (182.9 cm)  BEHAVIORAL SYMPTOMS/MOOD NEUROLOGICAL BOWEL NUTRITION STATUS      Continent Diet (Heart Healthy)  AMBULATORY STATUS COMMUNICATION OF NEEDS Skin   Limited Assist Verbally Normal                       Personal Care Assistance Level of Assistance  Bathing, Feeding, Dressing Bathing Assistance: Maximum assistance Feeding assistance: Independent Dressing Assistance: Maximum assistance     Functional Limitations Info  Sight, Speech, Hearing Sight Info: Adequate Hearing Info: Adequate Speech Info: Adequate    SPECIAL CARE FACTORS FREQUENCY  PT (By licensed PT), OT (By licensed OT)     PT Frequency: 5x per week OT Frequency: 5x per week            Contractures Contractures Info: Not present    Additional Factors Info  Code Status, Allergies Code Status Info: FULL Allergies Info: Abatacept, Bee Venom, Cefadroxil , Augmentin  (Amoxicillin -pot Clavulanate), Latex, Keflex  (Cephalexin ), Nulytely  (Peg 3350 -kcl-na Bicarb-nacl), Tomato           Current Medications (01/15/2024):  This is the current hospital active medication list Current Facility-Administered Medications  Medication Dose Route Frequency Provider Last Rate Last Admin   acetaminophen  (TYLENOL ) tablet 650 mg  650 mg Oral Q6H PRN Waddell Rake,  MD       Or   acetaminophen  (TYLENOL ) suppository 650 mg  650 mg Rectal Q6H PRN Waddell Rake, MD       apixaban  (ELIQUIS ) tablet 5 mg  5 mg Oral BID Waddell Rake, MD   5 mg at 01/15/24 0715   buPROPion  (WELLBUTRIN  XL) 24 hr tablet 150 mg  150 mg Oral Daily Waddell Rake, MD   150 mg at 01/15/24 0715   escitalopram  (LEXAPRO ) tablet 10 mg  10 mg Oral Daily  Waddell Rake, MD   10 mg at 01/15/24 0715   ezetimibe  (ZETIA ) tablet 10 mg  10 mg Oral Daily Waddell Rake, MD   10 mg at 01/15/24 0715   famotidine  (PEPCID ) tablet 40 mg  40 mg Oral Daily Waddell Rake, MD   40 mg at 01/15/24 0715   folic acid  (FOLVITE ) tablet 1 mg  1 mg Oral Daily Waddell Rake, MD   1 mg at 01/15/24 0715   gabapentin  (NEURONTIN ) capsule 200 mg  200 mg Oral QHS Waddell Rake, MD   200 mg at 01/14/24 2121   HYDROcodone -acetaminophen  (NORCO/VICODIN) 5-325 MG per tablet 1-2 tablet  1-2 tablet Oral Q4H PRN Waddell Rake, MD   2 tablet at 01/14/24 2233   hydroxychloroquine (PLAQUENIL) tablet 200 mg  200 mg Oral BID Waddell Rake, MD   200 mg at 01/15/24 0844   morphine  (PF) 2 MG/ML injection 2 mg  2 mg Intravenous Q2H PRN Waddell Rake, MD   2 mg at 01/15/24 9285   ondansetron  (ZOFRAN ) tablet 4 mg  4 mg Oral Q6H PRN Waddell Rake, MD       Or   ondansetron  (ZOFRAN ) injection 4 mg  4 mg Intravenous Q6H PRN Waddell Rake, MD       predniSONE  (DELTASONE ) tablet 5 mg  5 mg Oral QPC supper Waddell Rake, MD   5 mg at 01/14/24 1752   vancomycin  (VANCOREADY) IVPB 1500 mg/300 mL  1,500 mg Intravenous Q12H Waddell Rake, MD 150 mL/hr at 01/15/24 0846 1,500 mg at 01/15/24 0846     Discharge Medications: Please see discharge summary for a list of discharge medications.  Relevant Imaging Results:  Relevant Lab Results:   Additional Information SSN: [id number]  Heather LABOR Keanu Frickey, LCSW

## 2024-01-15 NOTE — Evaluation (Signed)
 Physical Therapy Evaluation/ late entry from 01/14/24 Patient Details Name: [name] MRN: [id number] DOB: September 10, 1965 Today's Date: 01/15/2024  History of Present Illness  58 yo female  presents to Ed 01/13/24  with increased LE edema and pain and drainage from the LLE wound .PMH: bilateral leg lymohademea(follows with PT). EFY:Izemzddpnw, anemia, anxiety, PE, rheumatoid arthritis, antiphospholipid, syndrome, chronic lymphedema, chronic LE wounds  Clinical Impression   Pt admitted with above diagnosis.  Pt currently with functional limitations due to the deficits listed below (see PT Problem List). Pt will benefit from acute skilled PT to increase their independence and safety with mobility to allow discharge.  Patient was evaluated 01/14/24.  The patient is well known  from previous  admissions.  Patient is pleasant and amenable to mobility this visit.  Patient reports LLE > RLE pain and drainage, dressings not in place at this time. Patient follows with OPPT for wrappings.   Patient presents with decreased  independence in mobility due to condition of lymphedema.  Patient will benefit from a new bariatric rollator as  the brakes on current device are broken.   Patient will benefit from continued inpatient follow up therapy, <3 hours/day to assist with  wound care and progress mobility to return to safe modified independence.       If plan is discharge home, recommend the following: A little help with walking and/or transfers;A little help with bathing/dressing/bathroom;Assistance with cooking/housework;Help with stairs or ramp for entrance;A lot of help with bathing/dressing/bathroom   Can travel by private vehicle   No    Equipment Recommendations Rollator (4 wheels) (needs a new bariatric rollator)  Recommendations for Other Services       Functional Status Assessment Patient has had a recent decline in their functional status and demonstrates the ability to make  significant improvements in function in a reasonable and predictable amount of time.     Precautions / Restrictions Precautions Precautions: Fall Precaution/Restrictions Comments: drainge from legs Restrictions Weight Bearing Restrictions Per Provider Order: No      Mobility  Bed Mobility Overal bed mobility: Modified Independent             General bed mobility comments: extra  time, use of belt to self move LLE    Transfers Overall transfer level: Needs assistance Equipment used: Rollator (4 wheels) Transfers: Sit to/from Stand Sit to Stand: Contact guard assist           General transfer comment: Able to tolerate sit to stand at [employer]. Did not report dizziness.    Ambulation/Gait                  Stairs            Wheelchair Mobility     Tilt Bed    Modified Rankin (Stroke Patients Only)       Balance Overall balance assessment: Mild deficits observed, not formally tested                                           Pertinent Vitals/Pain Pain Assessment Pain Assessment: Faces Faces Pain Scale: Hurts even more Pain Location: LLE Pain Descriptors / Indicators: Discomfort, Guarding, Grimacing Pain Intervention(s): Monitored during session, Limited activity within patient's tolerance    Home Living Family/patient expects to be discharged to:: Private residence Living Arrangements: Alone Available Help at Discharge: Family;Available PRN/intermittently Type of Home:  Apartment Home Access: Level entry       Home Layout: One level Home Equipment: Rollator (4 wheels);BSC/3in1;Shower seat;Adaptive equipment Additional Comments: gait belt to lift legs, reports dizziness in past and has fallen    Prior Function Prior Level of Function : Independent/Modified Independent             Mobility Comments: household distances daily, limited community distances when feeling well ADLs Comments: bird baths because  difficulty with getting into tub shower, manages medications, daughter takes her on errands     Extremity/Trunk Assessment   Upper Extremity Assessment Upper Extremity Assessment: Overall WFL for tasks assessed    Lower Extremity Assessment Lower Extremity Assessment: RLE deficits/detail;LLE deficits/detail RLE Deficits / Details: requires assist to lift LLE onto bed with belt, significant lymphadema LLE Deficits / Details: able to lift leg onto bed without support, significant edema    Cervical / Trunk Assessment Cervical / Trunk Assessment: Other exceptions Cervical / Trunk Exceptions: body habitus  Communication   Communication Communication: No apparent difficulties    Cognition Arousal: Alert Behavior During Therapy: WFL for tasks assessed/performed   PT - Cognitive impairments: No apparent impairments                                 Cueing       General Comments      Exercises     Assessment/Plan    PT Assessment Patient needs continued PT services  PT Problem List Decreased strength;Decreased range of motion;Decreased activity tolerance;Pain;Decreased skin integrity       PT Treatment Interventions DME instruction;Gait training;Functional mobility training;Therapeutic activities;Patient/family education    PT Goals (Current goals can be found in the Care Plan section)  Acute Rehab PT Goals Patient Stated Goal: to go somewhere that can help me PT Goal Formulation: With patient Time For Goal Achievement: 01/29/24 Potential to Achieve Goals: Good    Frequency Min 3X/week     Co-evaluation               AM-PAC PT 6 Clicks Mobility  Outcome Measure Help needed turning from your back to your side while in a flat bed without using bedrails?: None Help needed moving from lying on your back to sitting on the side of a flat bed without using bedrails?: A Little Help needed moving to and from a bed to a chair (including a wheelchair)?:  A Little Help needed standing up from a chair using your arms (e.g., wheelchair or bedside chair)?: A Little Help needed to walk in hospital room?: Total Help needed climbing 3-5 steps with a railing? : Total 6 Click Score: 15    End of Session Equipment Utilized During Treatment: Gait belt Activity Tolerance: Patient limited by fatigue;Patient limited by pain Patient left: in bed;with call bell/phone within reach Nurse Communication: Mobility status PT Visit Diagnosis: Unsteadiness on feet (R26.81);History of falling (Z91.81);Muscle weakness (generalized) (M62.81)    Time:  - 8649-8581     Charges:  Low evaluation, 1TA                [name] PT Acute Rehabilitation Services Office [phone]   [name] 01/15/2024, 7:40 AM

## 2024-01-15 NOTE — TOC Progression Note (Addendum)
 Transition of Care [facility]) - Progression Note    Patient Details  Name: [name] MRN: [id number] Date of Birth: 30-Jul-1965  Transition of Care [facility]) CM/SW Contact  Heather DELENA Saltness, LCSW Phone Number: 01/15/2024, 10:30 AM  Clinical Narrative:     12:29 PM - CSW uploaded signed FL2 and 30-day PASRR note to [place] [facility]. Level II PASRR currently pending.  CSW met with pt at bedside to discuss PT's recommendation for short-term SNF rehab upon discharge. Pt agreeable to recommendation. Level 2 PASRR, pending MD's signature on FL2 and 30-day note. CSW completed FL2 and sent referrals to SNF locations in [place] and surrounding areas. TOC will continue to follow, awaiting bed offers.   Expected Discharge Plan: Home w Home Health Services Barriers to Discharge: Continued Medical Work up   Expected Discharge Plan and Services       Living arrangements for the past 2 months: Apartment                      Social Drivers of Health (SDOH) Interventions SDOH Screenings   Food Insecurity: Food Insecurity Present (01/13/2024)  Housing: High Risk (01/13/2024)  Transportation Needs: Unmet Transportation Needs (01/13/2024)  Utilities: Not At Risk (01/13/2024)  Alcohol Screen: Low Risk  (10/30/2020)  Depression (PHQ2-9): Low Risk  (10/24/2023)  Recent Concern: Depression (PHQ2-9) - High Risk (09/14/2023)  Financial Resource Strain: Low Risk  (05/14/2023)  Physical Activity: Insufficiently Active (12/06/2022)  Social Connections: Moderately Integrated (12/06/2022)  Stress: No Stress Concern Present (12/06/2022)  Tobacco Use: Low Risk  (01/15/2024)  Health Literacy: Adequate Health Literacy (12/06/2022)    Readmission Risk Interventions    01/14/2024    3:09 PM  Readmission Risk Prevention Plan  Post Dischage Appt Complete  Medication Screening Complete  Transportation Screening Complete    Signed: Heather Saltness, MSW, LCSW Clinical Social Worker Inpatient Care  Management 01/15/2024 10:32 AM

## 2024-01-15 NOTE — TOC PASRR Note (Signed)
 [facility] IP TOC PASRR NOTE  30 Day PASRR Note   Patient Details  Name: [name] Date of Birth: Jul 04, 1965   Transition of Care [facility]) CM/SW Contact:    Heather DELENA Saltness, LCSW Phone Number: 01/15/2024, 10:28 AM  To Whom It May Concern:  Please be advised that this patient will require a short-term nursing home stay - anticipated 30 days or less for rehabilitation and strengthening.   The plan is for return home.  Signed: Heather Saltness, MSW, LCSW Clinical Social Worker Inpatient Care Management 01/15/2024 10:29 AM

## 2024-01-16 DIAGNOSIS — L03116 Cellulitis of left lower limb: Secondary | ICD-10-CM | POA: Diagnosis not present

## 2024-01-16 LAB — COMPREHENSIVE METABOLIC PANEL WITH GFR
ALT: <5 U/L (ref 0–44)
AST: 11 U/L — ABNORMAL LOW (ref 15–41)
Albumin: 3.4 g/dL — ABNORMAL LOW (ref 3.5–5.0)
Alkaline Phosphatase: 45 U/L (ref 38–126)
Anion gap: 7 (ref 5–15)
BUN: 10 mg/dL (ref 6–20)
CO2: 29 mmol/L (ref 22–32)
Calcium: 9.8 mg/dL (ref 8.9–10.3)
Chloride: 104 mmol/L (ref 98–111)
Creatinine, Ser: 1.04 mg/dL — ABNORMAL HIGH (ref 0.44–1.00)
GFR, Estimated: >60 mL/min (ref >60)
Glucose, Bld: 95 mg/dL (ref 70–99)
Potassium: 4.3 mmol/L (ref 3.5–5.1)
Sodium: 140 mmol/L (ref 135–145)
Total Bilirubin: 0.5 mg/dL (ref 0.0–1.2)
Total Protein: 7.5 g/dL (ref 6.5–8.1)

## 2024-01-16 LAB — CBC
HCT: 32 % — ABNORMAL LOW (ref 36.0–46.0)
Hemoglobin: 9.7 g/dL — ABNORMAL LOW (ref 12.0–15.0)
MCH: 28.2 pg (ref 26.0–34.0)
MCHC: 30.3 g/dL (ref 30.0–36.0)
MCV: 93 fL (ref 80.0–100.0)
Platelets: 287 K/uL (ref 150–400)
RBC: 3.44 MIL/uL — ABNORMAL LOW (ref 3.87–5.11)
RDW: 15.9 % — ABNORMAL HIGH (ref 11.5–15.5)
WBC: 4.8 K/uL (ref 4.0–10.5)
nRBC: 0 % (ref 0.0–0.2)

## 2024-01-16 MED ORDER — IPRATROPIUM-ALBUTEROL 0.5-2.5 (3) MG/3ML IN SOLN
3.0000 mL | RESPIRATORY_TRACT | Status: DC | PRN
  Administered 2024-01-16: 3 mL via RESPIRATORY_TRACT
  Filled 2024-01-16: qty 3

## 2024-01-16 MED ORDER — VANCOMYCIN HCL 1250 MG/250ML IV SOLN
1250.0000 mg | Freq: Two times a day (BID) | INTRAVENOUS | Status: DC
  Administered 2024-01-16 – 2024-01-18 (×4): 1250 mg via INTRAVENOUS
  Filled 2024-01-16 (×4): qty 250

## 2024-01-16 MED ORDER — HYDRALAZINE HCL 20 MG/ML IJ SOLN: 10.0000 mg | INTRAMUSCULAR | Status: DC | PRN

## 2024-01-16 MED ORDER — NYSTATIN 100000 UNIT/GM EX CREA
TOPICAL_CREAM | Freq: Two times a day (BID) | CUTANEOUS | Status: DC
  Administered 2024-01-17: 1 via TOPICAL
  Filled 2024-01-16: qty 30

## 2024-01-16 MED ORDER — METOPROLOL TARTRATE 5 MG/5ML IV SOLN: 5.0000 mg | INTRAVENOUS | Status: DC | PRN

## 2024-01-16 MED ORDER — LOSARTAN POTASSIUM 25 MG PO TABS
12.5000 mg | ORAL_TABLET | Freq: Every day | ORAL | Status: DC
  Administered 2024-01-16 – 2024-01-18 (×3): 12.5 mg via ORAL
  Filled 2024-01-16 (×3): qty 0.5

## 2024-01-16 NOTE — Progress Notes (Signed)
 Physical Therapy Treatment Patient Details Name: [name] MRN: [id number] DOB: [birth year] Today's Date: 01/16/2024   History of Present Illness 58 yo female  presents to Ed 01/13/24  with increased LE edema and pain and drainage from the LLE wound .PMH: bilateral leg lymohademea(follows with PT). EFY:Izemzddpnw, anemia, anxiety, PE, rheumatoid arthritis, antiphospholipid, syndrome, chronic lymphedema, chronic LE wounds    PT Comments  Pt making gradual progress.  She does need min A for stability, fatigues easily, and fall risk.  Do continue to recommend Patient will benefit from continued inpatient follow up therapy, <3 hours/day as pt lives alone and is below baseline.      If plan is discharge home, recommend the following: A little help with walking and/or transfers;A little help with bathing/dressing/bathroom;Assistance with cooking/housework;Help with stairs or ramp for entrance;A lot of help with bathing/dressing/bathroom   Can travel by private vehicle     Yes  Equipment Recommendations  Other (comment) (Upright platform bariatric rollator (please see therapy if there are questions about walker))    Recommendations for Other Services       Precautions / Restrictions Precautions Precautions: Fall     Mobility  Bed Mobility Overal bed mobility: Needs Assistance Bed Mobility: Supine to Sit, Sit to Supine     Supine to sit: Supervision Sit to supine: Supervision   General bed mobility comments: increased time but able to move LE without belt today    Transfers Overall transfer level: Needs assistance Equipment used: Rollator (4 wheels) Transfers: Sit to/from Stand Sit to Stand: Contact guard assist           General transfer comment: STS x 7 during session; shoes in place    Ambulation/Gait Ambulation/Gait assistance: Min assist Gait Distance (Feet): 60 Feet Assistive device: Rolling walker (2 wheels) Gait Pattern/deviations: Step-to pattern, Wide  base of support Gait velocity: decreased     General Gait Details: Pt leans forward on forearms onto rollator due to hand arthritis/pain.  Min A for stability   Stairs             Wheelchair Mobility     Tilt Bed    Modified Rankin (Stroke Patients Only)       Balance Overall balance assessment: Needs assistance Sitting-balance support: No upper extremity supported Sitting balance-Leahy Scale: Good     Standing balance support: Bilateral upper extremity supported Standing balance-Leahy Scale: Poor Standing balance comment: needs UE support                            Communication    Cognition Arousal: Alert Behavior During Therapy: WFL for tasks assessed/performed   PT - Cognitive impairments: No apparent impairments                                Cueing    Exercises      General Comments General comments (skin integrity, edema, etc.): VSS      Pertinent Vitals/Pain Pain Assessment Pain Assessment: 0-10 Pain Score: 8  Pain Location: LLE, joint pain Pain Descriptors / Indicators: Discomfort, Guarding, Grimacing Pain Intervention(s): Limited activity within patient's tolerance, Monitored during session, Premedicated before session, Repositioned    Home Living                          Prior Function  PT Goals (current goals can now be found in the care plan section) Progress towards PT goals: Progressing toward goals    Frequency    Min 3X/week      PT Plan      Co-evaluation              AM-PAC PT 6 Clicks Mobility   Outcome Measure  Help needed turning from your back to your side while in a flat bed without using bedrails?: None Help needed moving from lying on your back to sitting on the side of a flat bed without using bedrails?: A Little Help needed moving to and from a bed to a chair (including a wheelchair)?: A Little Help needed standing up from a chair using your arms  (e.g., wheelchair or bedside chair)?: A Little Help needed to walk in hospital room?: A Little Help needed climbing 3-5 steps with a railing? : Total 6 Click Score: 17    End of Session Equipment Utilized During Treatment: Gait belt Activity Tolerance: Patient limited by fatigue Patient left: in bed;with call bell/phone within reach (legs elevated) Nurse Communication: Mobility status PT Visit Diagnosis: Unsteadiness on feet (R26.81);History of falling (Z91.81);Muscle weakness (generalized) (M62.81)     Time: 8960-8940 PT Time Calculation (min) (ACUTE ONLY): 20 min  Charges:    $Gait Training: 8-22 mins PT General Charges $$ ACUTE PT VISIT: 1 Visit                     Benjiman, PT Acute Rehab Services [facility] [phone]    Benjiman VEAR Mulberry 01/16/2024, 11:32 AM

## 2024-01-16 NOTE — TOC Progression Note (Addendum)
 Transition of Care [facility]) - Progression Note    Patient Details  Name: [name] MRN: [id number] Date of Birth: 1965-07-04  Transition of Care [facility]) CM/SW Contact  Toy LITTIE Agar, RN Phone [phone]  01/16/2024, 2:28 PM  Clinical Narrative:    [name] has been approved. PASRR # [id number]  1612 Currently only 1 bed offer. Bed search has been expanded. Cm to follow up with patient on bed offer.    Expected Discharge Plan: Home w Home Health Services Barriers to Discharge: Continued Medical Work up               Expected Discharge Plan and Services       Living arrangements for the past 2 months: Apartment                                       Social Drivers of Health (SDOH) Interventions SDOH Screenings   Food Insecurity: Food Insecurity Present (01/13/2024)  Housing: High Risk (01/13/2024)  Transportation Needs: Unmet Transportation Needs (01/13/2024)  Utilities: Not At Risk (01/13/2024)  Alcohol Screen: Low Risk  (10/30/2020)  Depression (PHQ2-9): Low Risk  (10/24/2023)  Recent Concern: Depression (PHQ2-9) - High Risk (09/14/2023)  Financial Resource Strain: Low Risk  (05/14/2023)  Physical Activity: Insufficiently Active (12/06/2022)  Social Connections: Moderately Integrated (12/06/2022)  Stress: No Stress Concern Present (12/06/2022)  Tobacco Use: Low Risk  (01/15/2024)  Health Literacy: Adequate Health Literacy (12/06/2022)    Readmission Risk Interventions    01/14/2024    3:09 PM  Readmission Risk Prevention Plan  Post Dischage Appt Complete  Medication Screening Complete  Transportation Screening Complete

## 2024-01-16 NOTE — Progress Notes (Signed)
 PROGRESS NOTE    [name]  FMW:[id number] DOB: [birth year] DOA: 01/13/2024 PCP: Theotis Haze ORN, NP    Brief Narrative:   58 y.o. F with MO, lymphedema, RA on chronic pred 5 and HCQ, HTN, history of C. difficile, and hx VTE on Eliquis  who presented with left lower leg soft tissue pain and foul-smelling drainage from a posterior LLL wound.  Patient is currently on IV vancomycin .  Physical therapy has recommended SNF  Assessment & Plan:  Cellulitis of left lower leg Last episode was in July, admitted back to back at that time.  Treated with vancomycin  to Bactrim . Currently will plan on continuing IV vancomycin  thereafter transition to p.o. Bactrim  at the time of discharge to complete total 10-day course.       Normocytic anemia Hemoglobin appears to be in the 8-10 range, stable in the range now, iron  studies here are unremarkable   Atypical chest pain Myocardial injury due to infection Resolved, ECG normal, troponin low and flat, infarction ruled out, ischemia ruled out.  Doubt PE on Eliquis .   Lymphedema Followed at [facility]   History of pulmonary embolus (PE) - Continue apixaban    Rheumatoid arthritis (HCC) She has had to come off Cimzia recently due to infections.  I will continue home meds now - Continue home prednisone  5 mg daily and Plaquenil    Essential hypertension Resume home medications   Anxiety and depression - Continue bupropion  and citalopram   Morbid obesity (HCC)     DVT prophylaxis: apixaban  (ELIQUIS ) tablet 5 mg     Code Status: Full Code Family Communication:   Status is: Inpatient Remains inpatient appropriate because: Currently awaiting SNF placement   PT Follow up Recs: Skilled Nursing-Short Term Rehab (<3 Hours/Day)01/15/2024 [phone]  Subjective: Seen at bedside examining complaints at the moment.  Overall tells me she feels better and her pain is better as well   Examination:  General exam: Appears calm and  comfortable  Respiratory system: Clear to auscultation. Respiratory effort normal. Cardiovascular system: S1 & S2 heard, RRR. No JVD, murmurs, rubs, gallops or clicks. No pedal edema. Gastrointestinal system: Abdomen is nondistended, soft and nontender. No organomegaly or masses felt. Normal bowel sounds heard. Central nervous system: Alert and oriented. No focal neurological deficits. Extremities: Symmetric 5 x 5 power. Skin: Bilateral lower extremity chronic skin changes with significant swelling Psychiatry: Judgement and insight appear normal. Mood & affect appropriate.                Diet Orders (From admission, onward)     Start     Ordered   01/13/24 2041  Diet Heart Room service appropriate? Yes; Fluid consistency: Thin  Diet effective now       Question Answer Comment  Room service appropriate? Yes   Fluid consistency: Thin      01/13/24 2040            Objective: Vitals:   01/15/24 1319 01/15/24 2031 01/16/24 0457 01/16/24 1003  BP: (!) 141/84 (!) 154/81 (!) 177/93 136/66  Pulse: 71 78 74 83  Resp: 12 20 20    Temp: 97.8 F (36.6 C) 98.6 F (37 C) 98.8 F (37.1 C)   TempSrc: Oral Oral Oral   SpO2: 98% 100% 100%   Weight:      Height:        Intake/Output Summary (Last 24 hours) at 01/16/2024 1245 Last data filed at 01/16/2024 1011 Gross per 24 hour  Intake 360 ml  Output 4500 ml  Net -4140 ml   Filed Weights   01/13/24 1750  Weight: (!) 170 kg    Scheduled Meds:  apixaban   5 mg Oral BID   buPROPion   150 mg Oral Daily   escitalopram   10 mg Oral Daily   ezetimibe   10 mg Oral Daily   famotidine   40 mg Oral Daily   folic acid   1 mg Oral Daily   gabapentin   200 mg Oral QHS   hydroxychloroquine  200 mg Oral BID   losartan   12.5 mg Oral Daily   nystatin cream   Topical BID   predniSONE   5 mg Oral QPC supper   Continuous Infusions:  vancomycin       Nutritional status     Body mass index is 50.83 kg/m.  Data Reviewed:    CBC: Recent Labs  Lab 01/13/24 1913 01/14/24 0551 01/16/24 0614  WBC 5.7 4.7 4.8  NEUTROABS 3.9  --   --   HGB 10.2* 8.7* 9.7*  HCT 32.8* 29.1* 32.0*  MCV 91.6 93.6 93.0  PLT 290 249 287   Basic Metabolic Panel: Recent Labs  Lab 01/13/24 1913 01/14/24 0551 01/16/24 0614  NA 141 140 140  K 4.1 4.4 4.3  CL 106 106 104  CO2 28 25 29   GLUCOSE 159* 117* 95  BUN 12 10 10   CREATININE 0.99 0.84 1.04*  CALCIUM  9.9 9.3 9.8   GFR: Estimated Creatinine Clearance: 108 mL/min (A) (by C-G formula based on SCr of 1.04 mg/dL (H)). Liver Function Tests: Recent Labs  Lab 01/13/24 1913 01/16/24 0614  AST 16 11*  ALT 6 <5  ALKPHOS 54 45  BILITOT 1.2 0.5  PROT 8.0 7.5  ALBUMIN 3.5 3.4*   No results for input(s): LIPASE, AMYLASE in the last 168 hours. No results for input(s): AMMONIA in the last 168 hours. Coagulation Profile: No results for input(s): INR, PROTIME in the last 168 hours. Cardiac Enzymes: No results for input(s): CKTOTAL, CKMB, CKMBINDEX, TROPONINI in the last 168 hours. BNP (last 3 results) No results for input(s): PROBNP in the last 8760 hours. HbA1C: No results for input(s): HGBA1C in the last 72 hours. CBG: No results for input(s): GLUCAP in the last 168 hours. Lipid Profile: No results for input(s): CHOL, HDL, LDLCALC, TRIG, CHOLHDL, LDLDIRECT in the last 72 hours. Thyroid  Function Tests: No results for input(s): TSH, T4TOTAL, FREET4, T3FREE, THYROIDAB in the last 72 hours. Anemia Panel: Recent Labs    01/13/24 2125  FERRITIN 160  TIBC 249*  IRON  28   Sepsis Labs: Recent Labs  Lab 01/13/24 1913 01/13/24 1934 01/14/24 0551  PROCALCITON  --  <0.10 <0.10  LATICACIDVEN 1.6  --   --     No results found for this or any previous visit (from the past 240 hours).       Radiology Studies: No results found.         LOS: 2 days   Time spent= 35 mins    Burgess JAYSON Dare, MD [facility]  If 7PM-7AM, please contact night-coverage  01/16/2024, 12:45 PM

## 2024-01-16 NOTE — Progress Notes (Signed)
 PHARMACY NOTE:  ANTIMICROBIAL RENAL DOSAGE ADJUSTMENT  Current antimicrobial regimen includes a mismatch between antimicrobial dosage and estimated renal function.  As per policy approved by the Pharmacy & Therapeutics and Medical Executive Committees, the antimicrobial dosage will be adjusted accordingly.  Current antimicrobial dosage:  vancomycin  1500mg  IV q12h  Indication: cellulitis  Renal Function:  Estimated Creatinine Clearance: 108 mL/min (A) (by C-G formula based on SCr of 1.04 mg/dL (H)). []      On intermittent HD, scheduled: []      On CRRT    Antimicrobial dosage has been changed to:  vancomycin  1250mg  IV q12h (eAUC 467 using Scr 1.04, IBW, Vd 0.5)  Additional comments: N/A   Thank you for allowing pharmacy to be a part of this patient's care.  Lacinda Moats, PharmD Clinical Pharmacist  11/19/202510:06 AM

## 2024-01-16 NOTE — Plan of Care (Signed)
  Problem: Activity: Goal: Risk for activity intolerance will decrease Outcome: Progressing   Problem: Coping: Goal: Level of anxiety will decrease Outcome: Progressing   Problem: Pain Managment: Goal: General experience of comfort will improve and/or be controlled Outcome: Progressing   Problem: Safety: Goal: Ability to remain free from injury will improve Outcome: Progressing

## 2024-01-17 DIAGNOSIS — L03116 Cellulitis of left lower limb: Secondary | ICD-10-CM | POA: Diagnosis not present

## 2024-01-17 LAB — BASIC METABOLIC PANEL WITH GFR
Anion gap: 6 (ref 5–15)
BUN: 9 mg/dL (ref 6–20)
CO2: 29 mmol/L (ref 22–32)
Calcium: 9.9 mg/dL (ref 8.9–10.3)
Chloride: 104 mmol/L (ref 98–111)
Creatinine, Ser: 0.95 mg/dL (ref 0.44–1.00)
GFR, Estimated: >60 mL/min (ref >60)
Glucose, Bld: 84 mg/dL (ref 70–99)
Potassium: 4.3 mmol/L (ref 3.5–5.1)
Sodium: 139 mmol/L (ref 135–145)

## 2024-01-17 LAB — CBC
HCT: 32.8 % — ABNORMAL LOW (ref 36.0–46.0)
Hemoglobin: 9.9 g/dL — ABNORMAL LOW (ref 12.0–15.0)
MCH: 27.8 pg (ref 26.0–34.0)
MCHC: 30.2 g/dL (ref 30.0–36.0)
MCV: 92.1 fL (ref 80.0–100.0)
Platelets: 290 K/uL (ref 150–400)
RBC: 3.56 MIL/uL — ABNORMAL LOW (ref 3.87–5.11)
RDW: 15.9 % — ABNORMAL HIGH (ref 11.5–15.5)
WBC: 4.8 K/uL (ref 4.0–10.5)
nRBC: 0 % (ref 0.0–0.2)

## 2024-01-17 LAB — MAGNESIUM: Magnesium: 2.2 mg/dL (ref 1.7–2.4)

## 2024-01-17 LAB — PHOSPHORUS: Phosphorus: 3.1 mg/dL (ref 2.5–4.6)

## 2024-01-17 MED ORDER — HYDROXYCHLOROQUINE SULFATE 200 MG PO TABS: 200.0000 mg | ORAL_TABLET | Freq: Two times a day (BID) | ORAL | Status: AC

## 2024-01-17 MED ORDER — DOCUSATE SODIUM 100 MG PO CAPS: 100.0000 mg | ORAL_CAPSULE | Freq: Every day | ORAL | Status: DC

## 2024-01-17 MED ORDER — SENNOSIDES-DOCUSATE SODIUM 8.6-50 MG PO TABS
1.0000 | ORAL_TABLET | Freq: Two times a day (BID) | ORAL | Status: DC
  Administered 2024-01-17 – 2024-01-18 (×2): 1 via ORAL
  Filled 2024-01-17 (×3): qty 1

## 2024-01-17 MED ORDER — HYDROCODONE-ACETAMINOPHEN 5-325 MG PO TABS: 1.0000 | ORAL_TABLET | ORAL | 0 refills | Status: DC | PRN

## 2024-01-17 MED ORDER — BISACODYL 5 MG PO TBEC
10.0000 mg | DELAYED_RELEASE_TABLET | Freq: Once | ORAL | Status: AC
  Administered 2024-01-17: 10 mg via ORAL
  Filled 2024-01-17: qty 2

## 2024-01-17 MED ORDER — POLYETHYLENE GLYCOL 3350 17 G PO PACK: 17.0000 g | PACK | Freq: Two times a day (BID) | ORAL | Status: AC | PRN

## 2024-01-17 NOTE — Plan of Care (Signed)

## 2024-01-17 NOTE — TOC Progression Note (Addendum)
 Transition of Care [facility]) - Progression Note    Patient Details  Name: [name] MRN: [id number] Date of Birth: 06-08-1965  Transition of Care [facility]) CM/SW Contact  Toy LITTIE Agar, RN Phone Number:[phone]  01/17/2024, 12:19 PM  Clinical Narrative:    Cm at bedside to discuss disposition. CM presented bed offer to patient. Patient currently only has one bed offer at [facility]. Patient states that she would prefer to go home with Home health versus going to SNF. Patient states that she does live alone but has some help that will be able to check on her. Plan will be to discharge home with home health services. CM offered choice. Patient has no preference as long as agency will accept her insurance.    Expected Discharge Plan: Home w Home Health Services Barriers to Discharge: Continued Medical Work up               Expected Discharge Plan and Services       Living arrangements for the past 2 months: Apartment                                       Social Drivers of Health (SDOH) Interventions SDOH Screenings   Food Insecurity: Food Insecurity Present (01/13/2024)  Housing: High Risk (01/13/2024)  Transportation Needs: Unmet Transportation Needs (01/13/2024)  Utilities: Not At Risk (01/13/2024)  Alcohol Screen: Low Risk  (10/30/2020)  Depression (PHQ2-9): Low Risk  (10/24/2023)  Recent Concern: Depression (PHQ2-9) - High Risk (09/14/2023)  Financial Resource Strain: Low Risk  (05/14/2023)  Physical Activity: Insufficiently Active (12/06/2022)  Social Connections: Moderately Integrated (12/06/2022)  Stress: No Stress Concern Present (12/06/2022)  Tobacco Use: Low Risk  (01/15/2024)  Health Literacy: Adequate Health Literacy (12/06/2022)    Readmission Risk Interventions    01/14/2024    3:09 PM  Readmission Risk Prevention Plan  Post Dischage Appt Complete  Medication Screening Complete  Transportation Screening Complete

## 2024-01-17 NOTE — Progress Notes (Signed)
 PROGRESS NOTE    [name]  FMW:[id number] DOB: May 15, 1965 DOA: 01/13/2024 PCP: Theotis Haze ORN, NP    Brief Narrative:   58 y.o. F with MO, lymphedema, RA on chronic pred 5 and HCQ, HTN, history of C. difficile, and hx VTE on Eliquis  who presented with left lower leg soft tissue pain and foul-smelling drainage from a posterior LLL wound.  Patient is currently on IV vancomycin .  Physical therapy has recommended SNF  Assessment & Plan:  Cellulitis of left lower leg Last episode was in July, admitted back to back at that time.  Treated with vancomycin  to Bactrim . Currently will plan on continuing IV vancomycin  thereafter transition to p.o. Bactrim  at the time of discharge to complete total 10-day course.       Normocytic anemia Hemoglobin appears to be in the 8-10 range, stable in the range now, iron  studies here are unremarkable   Atypical chest pain Myocardial injury due to infection Resolved, ECG normal, troponin low and flat, infarction ruled out, ischemia ruled out.  Doubt PE on Eliquis .   Lymphedema Followed at [facility]ymphed[facility]   History of pulmonary embolus (PE) - Continue apixaban    Rheumatoid arthritis (HCC) She has had to come off Cimzia recently due to infections.  I will continue home meds now - Continue home prednisone  5 mg daily and Plaquenil    Essential hypertension Resume home medications   Anxiety and depression - Continue bupropion  and citalopram   Morbid obesity (HCC)     DVT prophylaxis: apixaban  (ELIQUIS ) tablet 5 mg     Code Status: Full Code Family Communication:   Status is: Inpatient Remains inpatient appropriate because: Currently awaiting SNF placement   PT Follow up Recs: Skilled Nursing-Short Term Rehab (<3 Hours/Day)01/15/2024 [phone]  Subjective: Seen at bedside examining complaints at the moment.   Lower extremity pain and discomfort has improved as well  Examination:  General exam: Appears calm and  comfortable  Respiratory system: Clear to auscultation. Respiratory effort normal. Cardiovascular system: S1 & S2 heard, RRR. No JVD, murmurs, rubs, gallops or clicks. No pedal edema. Gastrointestinal system: Abdomen is nondistended, soft and nontender. No organomegaly or masses felt. Normal bowel sounds heard. Central nervous system: Alert and oriented. No focal neurological deficits. Extremities: Symmetric 5 x 5 power. Skin: Bilateral lower extremity chronic skin changes with significant swelling Psychiatry: Judgement and insight appear normal. Mood & affect appropriate.                Diet Orders (From admission, onward)     Start     Ordered   01/13/24 2041  Diet Heart Room service appropriate? Yes; Fluid consistency: Thin  Diet effective now       Question Answer Comment  Room service appropriate? Yes   Fluid consistency: Thin      01/13/24 2040            Objective: Vitals:   01/16/24 1310 01/16/24 2005 01/17/24 0449 01/17/24 1021  BP: (!) 153/84 134/77 (!) 147/82 (!) 152/93  Pulse: 80 75 72 89  Resp: 16 18 18    Temp: 97.6 F (36.4 C) 98.7 F (37.1 C) 98.6 F (37 C)   TempSrc: Oral Oral Oral   SpO2: 99% 100% 98%   Weight:      Height:        Intake/Output Summary (Last 24 hours) at 01/17/2024 1140 Last data filed at 01/17/2024 0910 Gross per 24 hour  Intake 600 ml  Output 6200 ml  Net -5600 ml   Filed Weights   01/13/24 1750  Weight: (!) 170 kg    Scheduled Meds:  apixaban   5 mg Oral BID   buPROPion   150 mg Oral Daily   escitalopram   10 mg Oral Daily   ezetimibe   10 mg Oral Daily   famotidine   40 mg Oral Daily   folic acid   1 mg Oral Daily   gabapentin   200 mg Oral QHS   hydroxychloroquine  200 mg Oral BID   losartan   12.5 mg Oral Daily   nystatin cream   Topical BID   predniSONE   5 mg Oral QPC supper   senna-docusate  1 tablet Oral BID   Continuous Infusions:  vancomycin  1,250 mg (01/17/24 0801)    Nutritional status      Body mass index is 50.83 kg/m.  Data Reviewed:   CBC: Recent Labs  Lab 01/13/24 1913 01/14/24 0551 01/16/24 0614 01/17/24 0717  WBC 5.7 4.7 4.8 4.8  NEUTROABS 3.9  --   --   --   HGB 10.2* 8.7* 9.7* 9.9*  HCT 32.8* 29.1* 32.0* 32.8*  MCV 91.6 93.6 93.0 92.1  PLT 290 249 287 290   Basic Metabolic Panel: Recent Labs  Lab 01/13/24 1913 01/14/24 0551 01/16/24 0614 01/17/24 0717  NA 141 140 140 139  K 4.1 4.4 4.3 4.3  CL 106 106 104 104  CO2 28 25 29 29   GLUCOSE 159* 117* 95 84  BUN 12 10 10 9   CREATININE 0.99 0.84 1.04* 0.95  CALCIUM  9.9 9.3 9.8 9.9  MG  --   --   --  2.2  PHOS  --   --   --  3.1   GFR: Estimated Creatinine Clearance: 118.2 mL/min (by C-G formula based on SCr of 0.95 mg/dL). Liver Function Tests: Recent Labs  Lab 01/13/24 1913 01/16/24 0614  AST 16 11*  ALT 6 <5  ALKPHOS 54 45  BILITOT 1.2 0.5  PROT 8.0 7.5  ALBUMIN 3.5 3.4*   No results for input(s): LIPASE, AMYLASE in the last 168 hours. No results for input(s): AMMONIA in the last 168 hours. Coagulation Profile: No results for input(s): INR, PROTIME in the last 168 hours. Cardiac Enzymes: No results for input(s): CKTOTAL, CKMB, CKMBINDEX, TROPONINI in the last 168 hours. BNP (last 3 results) No results for input(s): PROBNP in the last 8760 hours. HbA1C: No results for input(s): HGBA1C in the last 72 hours. CBG: No results for input(s): GLUCAP in the last 168 hours. Lipid Profile: No results for input(s): CHOL, HDL, LDLCALC, TRIG, CHOLHDL, LDLDIRECT in the last 72 hours. Thyroid  Function Tests: No results for input(s): TSH, T4TOTAL, FREET4, T3FREE, THYROIDAB in the last 72 hours. Anemia Panel: No results for input(s): VITAMINB12, FOLATE, FERRITIN, TIBC, IRON , RETICCTPCT in the last 72 hours. Sepsis Labs: Recent Labs  Lab 01/13/24 1913 01/13/24 1934 01/14/24 0551  PROCALCITON  --  <0.10 <0.10  LATICACIDVEN 1.6  --   --      No results found for this or any previous visit (from the past 240 hours).       Radiology Studies: No results found.         LOS: 3 days   Time spent= 35 mins    Burgess JAYSON Dare, MD [facility]  If 7PM-7AM, please contact night-coverage  01/17/2024, 11:41 AM

## 2024-01-17 NOTE — Progress Notes (Signed)
 Occupational Therapy Treatment Patient Details Name: [name] MRN: [id number] DOB: 22-May-1965 Today's Date: 01/17/2024   History of present illness 58 yo female  presents to Ed 01/13/24  with increased LE edema and pain and drainage from the LLE wound .PMH: bilateral leg lymohademea(follows with PT). EFY:Izemzddpnw, anemia, anxiety, PE, rheumatoid arthritis, antiphospholipid, syndrome, chronic lymphedema, chronic LE wounds   OT comments  Patient seen for skilled OT session this am. Access for [facility] use with good progress as well as BADL progression to OOB to bari recliner. See below for current status. Patient motivated and open to all therapy presented. Patient will benefit from continued inpatient follow up therapy, <3 hours/day. Patient requires continued Acute care hospital level OT services to progress safety and functional performance and allow for discharge.        If plan is discharge home, recommend the following:  A lot of help with walking and/or transfers;A lot of help with bathing/dressing/bathroom;Assistance with cooking/housework;Assist for transportation;Help with stairs or ramp for entrance   Equipment Recommendations  Tub/shower seat       Precautions / Restrictions Precautions Precautions: Fall Precaution/Restrictions Comments: drainge from legs Restrictions Weight Bearing Restrictions Per Provider Order: No       Mobility Bed Mobility Overal bed mobility: Needs Assistance Bed Mobility: Supine to Sit     Supine to sit: Supervision Sit to supine: Supervision   General bed mobility comments: use of gait belt for LE management as needed    Transfers Overall transfer level: Needs assistance Equipment used: Rollator (4 wheels) Transfers: Sit to/from Stand, Bed to chair/wheelchair/BSC Sit to Stand: Contact guard assist     Step pivot transfers: Contact guard assist     General transfer comment: B to and from [facility] and then to bari recliner      Balance Overall balance assessment: Needs assistance Sitting-balance support: No upper extremity supported Sitting balance-Leahy Scale: Good     Standing balance support: Bilateral upper extremity supported Standing balance-Leahy Scale: Poor Standing balance comment: needs UE support                           ADL either performed or assessed with clinical judgement   ADL Overall ADL's : Needs assistance/impaired Eating/Feeding: Independent   Grooming: Wash/dry hands;Wash/dry face;Oral care;Sitting;Modified independent   Upper Body Bathing: Sitting;Contact guard assist   Lower Body Bathing: Moderate assistance;Maximal assistance;Sitting/lateral leans   Upper Body Dressing : Contact guard assist;Sitting   Lower Body Dressing: Moderate assistance;Maximal assistance;Sitting/lateral leans   Toilet Transfer: Contact guard assist Toilet Transfer Details (indicate cue type and reason): HD BSC use Toileting- Clothing Manipulation and Hygiene: Moderate assistance;Sitting/lateral lean       Functional mobility during ADLs: Contact guard assist General ADL Comments: body habitus and weakness limits LB functional reach    Extremity/Trunk Assessment Upper Extremity Assessment Upper Extremity Assessment: Right hand dominant;Generalized weakness   Lower Extremity Assessment Lower Extremity Assessment: Defer to PT evaluation        Vision       Perception     Praxis     Communication Communication Communication: No apparent difficulties   Cognition Arousal: Alert Behavior During Therapy: WFL for tasks assessed/performed Cognition: No apparent impairments, Cognition impaired             OT - Cognition Comments: reports clearing with processing                 Following commands: Intact  Cueing   Cueing Techniques: Verbal cues        General Comments significant reduction in B LE edema from last session but remained +2 overall with L LE  dressing/ACE wrap in place    Pertinent Vitals/ Pain       Pain Assessment Pain Assessment: 0-10 Pain Score: 5  Pain Location: LLE, joint pain Pain Descriptors / Indicators: Discomfort, Guarding, Grimacing Pain Intervention(s): Limited activity within patient's tolerance, Repositioned, Relaxation   Frequency  Min 2X/week        Progress Toward Goals  OT Goals(current goals can now be found in the care plan section)  Progress towards OT goals: Progressing toward goals  Acute Rehab OT Goals Patient Stated Goal: to keep losing the fluid in my legs OT Goal Formulation: With patient Time For Goal Achievement: 01/29/24 Potential to Achieve Goals: Good ADL Goals Pt Will Perform Lower Body Bathing: with adaptive equipment;sit to/from stand;with min assist Pt Will Perform Lower Body Dressing: with min assist;with adaptive equipment;sit to/from stand Pt Will Transfer to Toilet: with min assist;bedside commode;ambulating Pt Will Perform Toileting - Clothing Manipulation and hygiene: with min assist;with adaptive equipment;sitting/lateral leans Pt Will Perform Tub/Shower Transfer: Shower transfer;shower seat;with min assist  Plan         AM-PAC OT 6 Clicks Daily Activity     Outcome Measure   Help from another person eating meals?: None Help from another person taking care of personal grooming?: None Help from another person toileting, which includes using toliet, bedpan, or urinal?: A Lot Help from another person bathing (including washing, rinsing, drying)?: A Lot Help from another person to put on and taking off regular upper body clothing?: A Little Help from another person to put on and taking off regular lower body clothing?: A Lot 6 Click Score: 17    End of Session Equipment Utilized During Treatment: Gait belt;Rollator (4 wheels)  OT Visit Diagnosis: Unsteadiness on feet (R26.81);Muscle weakness (generalized) (M62.81);Pain Pain - part of body:  (joint pain)    Activity Tolerance Patient tolerated treatment well   Patient Left in chair;with call bell/phone within reach   Nurse Communication Mobility status;Other (comment) (voiding data entered in Flowsheets)        Time: 9059-8979 OT Time Calculation (min): 40 min  Charges: OT General Charges $OT Visit: 1 Visit OT Treatments $Self Care/Home Management : 8-22 mins $Therapeutic Activity: 8-22 mins  Dorion Petillo OT/L Acute Rehabilitation Department  [phone]  01/17/2024, 10:51 AM

## 2024-01-18 ENCOUNTER — Other Ambulatory Visit (HOSPITAL_COMMUNITY): Payer: Self-pay

## 2024-01-18 DIAGNOSIS — L03116 Cellulitis of left lower limb: Secondary | ICD-10-CM | POA: Diagnosis not present

## 2024-01-18 MED ORDER — HYDROCODONE-ACETAMINOPHEN 5-325 MG PO TABS
1.0000 | ORAL_TABLET | Freq: Four times a day (QID) | ORAL | 0 refills | Status: AC | PRN
  Filled 2024-01-18: qty 30, 5d supply, fill #0

## 2024-01-18 MED ORDER — SULFAMETHOXAZOLE-TRIMETHOPRIM 800-160 MG PO TABS
1.0000 | ORAL_TABLET | Freq: Two times a day (BID) | ORAL | 0 refills | Status: AC
  Filled 2024-01-18: qty 10, 5d supply, fill #0

## 2024-01-18 MED ORDER — SULFAMETHOXAZOLE-TRIMETHOPRIM 800-160 MG PO TABS: 1.0000 | ORAL_TABLET | Freq: Two times a day (BID) | ORAL | Status: DC

## 2024-01-18 MED ORDER — DOCUSATE SODIUM 100 MG PO CAPS
100.0000 mg | ORAL_CAPSULE | Freq: Two times a day (BID) | ORAL | 0 refills | Status: AC
  Filled 2024-01-18: qty 60, 30d supply, fill #0

## 2024-01-18 NOTE — Plan of Care (Signed)

## 2024-01-18 NOTE — Discharge Summary (Signed)
 Physician Discharge Summary  Lauren MCAULEY FMW:[id number] DOB: [birth year] DOA: 01/13/2024  PCP: Theotis Haze ORN, NP  Admit date: 01/13/2024 Discharge date: 01/18/2024  Admitted From: Home Disposition: Home with home health  Recommendations for Outpatient Follow-up:  Follow up with PCP in 1-2 weeks Please obtain BMP/CBC in one week your next doctors visit.  P.o. Bactrim , EOT 11/26 Outpatient follow-up with her lymphedema clinic   Discharge Condition: Stable CODE STATUS: Full code Diet recommendation: Heart healthy  Brief/Interim Summary: Brief Narrative:   58 y.o. F with MO, lymphedema, RA on chronic pred 5 and HCQ, HTN, history of C. difficile, and hx VTE on Eliquis  who presented with left lower leg soft tissue pain and foul-smelling drainage from a posterior LLL wound.  Patient is currently on IV vancomycin .  Physical therapy has recommended SNF but patient opted to go home with [facility] due to her SNF option.  HH was arranged for her Changing her to PO Bactrim  from IV Vanc.   Assessment & Plan:  Cellulitis of left lower leg Last episode was in July, admitted back to back at that time.  Treated with vancomycin  to Bactrim . Currently will plan on continuing IV vancomycin  thereafter transition to p.o. Bactrim  at the time of discharge to complete total 10-day course.       Normocytic anemia Hemoglobin appears to be in the 8-10 range, stable in the range now, iron  studies here are unremarkable   Atypical chest pain Myocardial injury due to infection Resolved, ECG normal, troponin low and flat, infarction ruled out, ischemia ruled out.  Doubt PE on Eliquis .   Lymphedema Followed at [facility] lymphedema clinic   History of pulmonary embolus (PE) - Continue apixaban    Rheumatoid arthritis (HCC) She has had to come off Cimzia recently due to infections.  I will continue home meds now - Continue home prednisone  5 mg daily and Plaquenil     Essential hypertension Resume home  medications   Anxiety and depression - Continue bupropion  and citalopram   Morbid obesity (HCC)     DVT prophylaxis: apixaban  (ELIQUIS ) tablet 5 mg     Code Status: Full Code Family Communication:   Status is: Inpatient Remains inpatient appropriate because: dc    PT Follow up Recs: Skilled Nursing-Short Term Rehab (<3 Hours/Day)01/15/2024 [phone]  Subjective: Prefers to go home with [facility] No other ocmplaints.   Examination:  General exam: Appears calm and comfortable  Respiratory system: Clear to auscultation. Respiratory effort normal. Cardiovascular system: S1 & S2 heard, RRR. No JVD, murmurs, rubs, gallops or clicks. No pedal edema. Gastrointestinal system: Abdomen is nondistended, soft and nontender. No organomegaly or masses felt. Normal bowel sounds heard. Central nervous system: Alert and oriented. No focal neurological deficits. Extremities: Symmetric 5 x 5 power. Skin: Bilateral lower extremity chronic skin changes with significant swelling Psychiatry: Judgement and insight appear normal. Mood & affect appropriate.    Discharge Diagnoses:  Principal Problem:   Cellulitis of left lower leg Active Problems:   Normocytic anemia   Lymphedema   Atypical chest pain   History of pulmonary embolus (PE)   Rheumatoid arthritis (HCC)   Essential hypertension   Anxiety and depression   Morbid obesity (HCC)      Discharge Exam: Vitals:   01/18/24 0456 01/18/24 0958  BP: (!) 153/78 (!) 141/72  Pulse: 72 77  Resp: 18   Temp: 98.5 F (36.9 C)   SpO2: 98%    Vitals:   01/17/24 1328 01/17/24 2054 01/18/24 0456 01/18/24 9041  BP: (!) 154/87 (!) 154/87 (!) 153/78 (!) 141/72  Pulse: 68 71 72 77  Resp: 19 18 18    Temp: 98.2 F (36.8 C) 98.2 F (36.8 C) 98.5 F (36.9 C)   TempSrc: Oral Oral Oral   SpO2: 97% 97% 98%   Weight:      Height:          Discharge Instructions   Allergies as of 01/18/2024       Reactions   Abatacept Anaphylaxis, Other (See  Comments)   Orencia: Anaphylaxis-Streptomycin Clickject   Bee Venom Anaphylaxis   Cefadroxil  Diarrhea   Augmentin  [amoxicillin -pot Clavulanate] Hives, Itching, Other (See Comments)   Pt reports no recollection of any reactions when taking penicillin in past   Latex Hives, Other (See Comments)   Local reaction (welts). Patient denies any wheezing or other reaction with latex exposure.   Keflex  [cephalexin ] Diarrhea, Nausea Only, Other (See Comments)   Causes extreme sleepiness, nausea, and diarrhea   Nulytely  [peg 3350 -kcl-na Bicarb-nacl] Nausea And Vomiting, Other (See Comments)   NAUSEA AND VOMITING. MAY TOLERATE LOW VOLUME PREP.   Tomato Other (See Comments)   Caused sores in the mouth        Medication List     TAKE these medications    albuterol  108 (90 Base) MCG/ACT inhaler Commonly known as: VENTOLIN  HFA Inhale 2 puffs into the lungs every 6 (six) hours as needed for wheezing or shortness of breath.   apixaban  5 MG Tabs tablet Commonly known as: Eliquis  Take 1 tablet (5 mg total) by mouth 2 (two) times daily.   Bariatric Rollator Misc Please provide patient with insurance approved bariatric rollator walker. I89.0, R53.81, R26.81   buPROPion  150 MG 24 hr tablet Commonly known as: Wellbutrin  XL Take 1 tablet (150 mg total) by mouth daily.   docusate sodium  100 MG capsule Commonly known as: Colace Take 1 capsule (100 mg total) by mouth 2 (two) times daily.   escitalopram  10 MG tablet Commonly known as: Lexapro  Take 1 tablet (10 mg total) by mouth daily. For depression   ezetimibe  10 MG tablet Commonly known as: Zetia  Take 1 tablet (10 mg total) by mouth daily. For high cholesterol   famotidine  40 MG tablet Commonly known as: PEPCID  Take 1 tablet by mouth once daily   folic acid  1 MG tablet Commonly known as: FOLVITE  Take 1 tablet by mouth once daily   gabapentin  100 MG capsule Commonly known as: NEURONTIN  Take 200 mg by mouth at bedtime.    HYDROcodone -acetaminophen  5-325 MG tablet Commonly known as: NORCO/VICODIN Take 1-2 tablets by mouth every 6 (six) hours as needed for moderate pain (pain score 4-6) or severe pain (pain score 7-10).   hydroxychloroquine  200 MG tablet Commonly known as: PLAQUENIL  Take 1 tablet (200 mg total) by mouth 2 (two) times daily.   losartan  25 MG tablet Commonly known as: COZAAR  Take 0.5 tablets (12.5 mg total) by mouth at bedtime.   polyethylene glycol 17 g packet Commonly known as: MiraLax  Take 17 g by mouth 2 (two) times daily as needed for moderate constipation or severe constipation.   predniSONE  5 MG tablet Commonly known as: DELTASONE  Take 5-10 mg by mouth daily after supper.   sulfamethoxazole -trimethoprim  800-160 MG tablet Commonly known as: BACTRIM  DS Take 1 tablet by mouth every 12 (twelve) hours for 5 days.        Follow-up Information     Theotis Haze ORN, NP Follow up in 1 week(s).   Specialty: Nurse  Practitioner Contact information: [address] [place] [address] [phone]         Theotis Haze ORN, NP .   Specialty: Nurse Practitioner Contact information: [address] [place] [address] [phone]                Allergies  Allergen Reactions   Abatacept Anaphylaxis and Other (See Comments)    Orencia: Anaphylaxis-Streptomycin Clickject    Bee Venom Anaphylaxis   Cefadroxil  Diarrhea   Augmentin  [Amoxicillin -Pot Clavulanate] Hives, Itching and Other (See Comments)    Pt reports no recollection of any reactions when taking penicillin in past   Latex Hives and Other (See Comments)    Local reaction (welts). Patient denies any wheezing or other reaction with latex exposure.   Keflex  [Cephalexin ] Diarrhea, Nausea Only and Other (See Comments)    Causes extreme sleepiness, nausea, and diarrhea   Nulytely  [Peg 3350 -Kcl-Na Bicarb-Nacl] Nausea And Vomiting and Other (See Comments)    NAUSEA AND VOMITING. MAY TOLERATE LOW VOLUME  PREP.   Tomato Other (See Comments)    Caused sores in the mouth    You were cared for by a hospitalist during your hospital stay. If you have any questions about your discharge medications or the care you received while you were in the hospital after you are discharged, you can call the unit and asked to speak with the hospitalist on call if the hospitalist that took care of you is not available. Once you are discharged, your primary care physician will handle any further medical issues. Please note that no refills for any discharge medications will be authorized once you are discharged, as it is imperative that you return to your primary care physician (or establish a relationship with a primary care physician if you do not have one) for your aftercare needs so that they can reassess your need for medications and monitor your lab values.  You were cared for by a hospitalist during your hospital stay. If you have any questions about your discharge medications or the care you received while you were in the hospital after you are discharged, you can call the unit and asked to speak with the hospitalist on call if the hospitalist that took care of you is not available. Once you are discharged, your primary care physician will handle any further medical issues. Please note that NO REFILLS for any discharge medications will be authorized once you are discharged, as it is imperative that you return to your primary care physician (or establish a relationship with a primary care physician if you do not have one) for your aftercare needs so that they can reassess your need for medications and monitor your lab values.  Please request your Prim.MD to go over all Hospital Tests and Procedure/Radiological results at the follow up, please get all Hospital records sent to your Prim MD by signing hospital release before you go home.  Get CBC, CMP, 2 view Chest X ray checked  by Primary MD during your next visit or SNF  MD in 5-7 days ( we routinely change or add medications that can affect your baseline labs and fluid status, therefore we recommend that you get the mentioned basic workup next visit with your PCP, your PCP may decide not to get them or add new tests based on their clinical decision)  On your next visit with your primary care physician please Get Medicines reviewed and adjusted.  If you experience worsening of your admission symptoms, develop  shortness of breath, life threatening emergency, suicidal or homicidal thoughts you must seek medical attention immediately by calling 911 or calling your MD immediately  if symptoms less severe.  You Must read complete instructions/literature along with all the possible adverse reactions/side effects for all the Medicines you take and that have been prescribed to you. Take any new Medicines after you have completely understood and accpet all the possible adverse reactions/side effects.   Do not drive, operate heavy machinery, perform activities at heights, swimming or participation in water  activities or provide baby sitting services if your were admitted for syncope or siezures until you have seen by Primary MD or a Neurologist and advised to do so again.  Do not drive when taking Pain medications.   Procedures/Studies: No results found.   The results of significant diagnostics from this hospitalization (including imaging, microbiology, ancillary and laboratory) are listed below for reference.     Microbiology: No results found for this or any previous visit (from the past 240 hours).   Labs: BNP (last 3 results) No results for input(s): BNP in the last 8760 hours. Basic Metabolic Panel: Recent Labs  Lab 01/13/24 1913 01/14/24 0551 01/16/24 0614 01/17/24 0717  NA 141 140 140 139  K 4.1 4.4 4.3 4.3  CL 106 106 104 104  CO2 28 25 29 29   GLUCOSE 159* 117* 95 84  BUN 12 10 10 9   CREATININE 0.99 0.84 1.04* 0.95  CALCIUM  9.9 9.3 9.8 9.9   MG  --   --   --  2.2  PHOS  --   --   --  3.1   Liver Function Tests: Recent Labs  Lab 01/13/24 1913 01/16/24 0614  AST 16 11*  ALT 6 <5  ALKPHOS 54 45  BILITOT 1.2 0.5  PROT 8.0 7.5  ALBUMIN 3.5 3.4*   No results for input(s): LIPASE, AMYLASE in the last 168 hours. No results for input(s): AMMONIA in the last 168 hours. CBC: Recent Labs  Lab 01/13/24 1913 01/14/24 0551 01/16/24 0614 01/17/24 0717  WBC 5.7 4.7 4.8 4.8  NEUTROABS 3.9  --   --   --   HGB 10.2* 8.7* 9.7* 9.9*  HCT 32.8* 29.1* 32.0* 32.8*  MCV 91.6 93.6 93.0 92.1  PLT 290 249 287 290   Cardiac Enzymes: No results for input(s): CKTOTAL, CKMB, CKMBINDEX, TROPONINI in the last 168 hours. BNP: Invalid input(s): POCBNP CBG: No results for input(s): GLUCAP in the last 168 hours. D-Dimer No results for input(s): DDIMER in the last 72 hours. Hgb A1c No results for input(s): HGBA1C in the last 72 hours. Lipid Profile No results for input(s): CHOL, HDL, LDLCALC, TRIG, CHOLHDL, LDLDIRECT in the last 72 hours. Thyroid  function studies No results for input(s): TSH, T4TOTAL, T3FREE, THYROIDAB in the last 72 hours.  Invalid input(s): FREET3 Anemia work up No results for input(s): VITAMINB12, FOLATE, FERRITIN, TIBC, IRON , RETICCTPCT in the last 72 hours. Urinalysis    Component Value Date/Time   COLORURINE COLORLESS (A) 04/08/2021 0453   APPEARANCEUR CLEAR 04/08/2021 0453   LABSPEC 1.006 04/08/2021 0453   PHURINE 8.0 04/08/2021 0453   GLUCOSEU NEGATIVE 04/08/2021 0453   HGBUR SMALL (A) 04/08/2021 0453   BILIRUBINUR NEGATIVE 04/08/2021 0453   BILIRUBINUR negative 01/16/2020 1735   BILIRUBINUR small 03/09/2017 1230   KETONESUR NEGATIVE 04/08/2021 0453   PROTEINUR NEGATIVE 04/08/2021 0453   UROBILINOGEN 0.2 01/16/2020 1735   UROBILINOGEN 1.0 08/30/2010 1456   NITRITE NEGATIVE 04/08/2021 0453   LEUKOCYTESUR  NEGATIVE 04/08/2021 0453   Sepsis  Labs Recent Labs  Lab 01/13/24 1913 01/14/24 0551 01/16/24 0614 01/17/24 0717  WBC 5.7 4.7 4.8 4.8   Microbiology No results found for this or any previous visit (from the past 240 hours).   Time coordinating discharge:  I have spent 35 minutes face to face with the patient and on the ward discussing the patients care, assessment, plan and disposition with other care givers. >50% of the time was devoted counseling the patient about the risks and benefits of treatment/Discharge disposition and coordinating care.   SIGNED:   [place]ess JAYSON Dare, MD  [facility] 01/18/2024, 12:23 PM   If 7PM-7AM, please contact night-coverage

## 2024-01-18 NOTE — Progress Notes (Signed)
 PT Cancellation Note  Patient Details Name: [name] MRN: [id number] DOB: 02/28/1966   Cancelled Treatment:    Reason Eval/Treat Not Completed: Other (comment). PT arrived 1023 pt reported that she had something for pain this am, pt is down from 8/10 to a 2/10 however pt declined to participate with therapy due to the fact pain medications make her woozy and she does not want to fall. PT to return as schedule allows and continue to follow acutely.   [place], PT Acute Rehab   [place] VEAR Drone 01/18/2024, 10:28 AM

## 2024-01-18 NOTE — TOC Progression Note (Signed)
 Transition of Care [facility]) - Progression Note    Patient Details  Name: [name] MRN: [id number] Date of Birth: 01-06-1966  [facility] of [facility]) CM/SW Contact  Toy LITTIE Agar, RN Phone Number:[phone]  01/18/2024, 10:55 AM  Clinical Narrative:    HH referral has been accepted by [facility].    Expected Discharge Plan: Home w Home Health Services Barriers to Discharge: Continued Medical Work up               Expected Discharge Plan and Services       Living arrangements for the past 2 months: Apartment                                       Social Drivers of Health (SDOH) Interventions SDOH Screenings   Food Insecurity: Food Insecurity Present (01/13/2024)  Housing: High Risk (01/13/2024)  Transportation Needs: Unmet Transportation Needs (01/13/2024)  Utilities: Not At Risk (01/13/2024)  Alcohol Screen: Low Risk  (10/30/2020)  Depression (PHQ2-9): Low Risk  (10/24/2023)  Recent Concern: Depression (PHQ2-9) - High Risk (09/14/2023)  Financial Resource Strain: Low Risk  (05/14/2023)  Physical Activity: Insufficiently Active (12/06/2022)  Social Connections: Moderately Integrated (12/06/2022)  Stress: No Stress Concern Present (12/06/2022)  Tobacco Use: Low Risk  (01/15/2024)  Health Literacy: Adequate Health Literacy (12/06/2022)    Readmission Risk Interventions    01/14/2024    3:09 PM  Readmission Risk Prevention Plan  Post Dischage Appt Complete  Medication Screening Complete  Transportation Screening Complete

## 2024-01-18 NOTE — Plan of Care (Signed)

## 2024-01-18 NOTE — Care Management Important Message (Signed)
 Important Message  Patient Details IM letter given. Name: [name] MRN: [id number] Date of Birth: [birth year]   Important Message Given:  Yes - Medicare IM     Taequan Stockhausen 01/18/2024, 1:54 PM

## 2024-01-18 NOTE — Progress Notes (Signed)
 Physical Therapy Treatment Patient Details Name: [name] MRN: [id number] DOB: Jul 27, 1965 Today's Date: 01/18/2024   History of Present Illness 58 yo female  presents to Ed 01/13/24  with increased LE edema and pain and drainage from the LLE wound .PMH: bilateral leg lymohademea(follows with PT). EFY:Izemzddpnw, anemia, anxiety, PE, rheumatoid arthritis, antiphospholipid, syndrome, chronic lymphedema, chronic LE wounds    PT Comments   Pt admitted with above diagnosis.  Pt currently with functional limitations due to the deficits listed below (see PT Problem List). PT returned about 2 hrs later and pt agreeable to therapy intervention. Pt reported that her pain was now a 5/10 but no longer feeling woozy. Pt is S and min cues for supine to sit with HOB elevated, CGA for initial sit to stand to RW and progressed to close S with RW min cues for recliner and BSC over standard commode transfer, pt able to demonstrate ability to side step and retrograde stepping pattern to complete transfer to commode no overt LOB noted, pt required close S for gait tasks in hallway of 100 feet with B shoes donned and RW with slow cadence, wide BOS, lateral sway with slight trunk flexion, noted L foot pronation and limited foot clearance and stride length pt limited by fatigue, pt seated in recliner and reported need to void bowels. PT elevated BSC and placed over standard commode and pt left seated on commode, all needs in place and nurse tech aware. Pt is requesting to d/c home with [facility] services vs SNF at this time. Pt states some assist from family members. Pt will benefit from bariatric upright rollator.  Pt will benefit from acute skilled PT to increase their independence and safety with mobility to allow discharge.      If plan is discharge home, recommend the following: A little help with walking and/or transfers;A little help with bathing/dressing/bathroom;Assistance with cooking/housework;Help with stairs or  ramp for entrance   Can travel by private vehicle     Yes  Equipment Recommendations  Other (comment) (Upright platform bariatric rollator (please see therapy if there are questions about walker))    Recommendations for Other Services       Precautions / Restrictions Precautions Precautions: Fall Precaution/Restrictions Comments: drainge from legs--L LE wrapped (11/21) Restrictions Weight Bearing Restrictions Per Provider Order: No     Mobility  Bed Mobility Overal bed mobility: Needs Assistance Bed Mobility: Supine to Sit     Supine to sit: Supervision     General bed mobility comments: pt able to use hospital bed features and no use of gait belt as leg lifter today    Transfers Overall transfer level: Needs assistance Equipment used: Rolling walker (2 wheels) Transfers: Sit to/from Stand Sit to Stand: Supervision           General transfer comment: close S and min cues, pt electing to use RW due to rollator brakes broken on personal rollator    Ambulation/Gait Ambulation/Gait assistance: Supervision Gait Distance (Feet): 100 Feet Assistive device: Rolling walker (2 wheels) Gait Pattern/deviations: Step-to pattern, Wide base of support Gait velocity: decreased     General Gait Details: pt exhibits L foot pronation with, wide BOS, lateral sway, trunk flexion and pt occationally using B forarms for support vs hands due to pain, B DM shoes donned-- pt reports B LE edema has decreased and R LE is almost baseline   Optometrist  Tilt Bed    Modified Rankin (Stroke Patients Only)       Balance Overall balance assessment: Needs assistance Sitting-balance support: No upper extremity supported Sitting balance-Leahy Scale: Good     Standing balance support: Bilateral upper extremity supported Standing balance-Leahy Scale: Poor Standing balance comment: static standing no UE support and no overt LOB, B UE support at  RW for safety and stabiltiy with dynamic tasks                            Communication Communication Communication: No apparent difficulties  Cognition Arousal: Alert Behavior During Therapy: WFL for tasks assessed/performed   PT - Cognitive impairments: No apparent impairments                         Following commands: Intact      Cueing Cueing Techniques: Verbal cues  Exercises      General Comments        Pertinent Vitals/Pain Pain Assessment Pain Assessment: 0-10 Pain Score: 5  Pain Location: LLE, joint pain Pain Descriptors / Indicators: Discomfort, Guarding, Grimacing Pain Intervention(s): Limited activity within patient's tolerance, Monitored during session, Premedicated before session, Repositioned    Home Living                          Prior Function            PT Goals (current goals can now be found in the care plan section) Acute Rehab PT Goals Patient Stated Goal: to go somewhere that can help me PT Goal Formulation: With patient Time For Goal Achievement: 01/29/24 Potential to Achieve Goals: Good Progress towards PT goals: Progressing toward goals    Frequency    Min 3X/week      PT Plan      Co-evaluation              AM-PAC PT 6 Clicks Mobility   Outcome Measure  Help needed turning from your back to your side while in a flat bed without using bedrails?: None Help needed moving from lying on your back to sitting on the side of a flat bed without using bedrails?: A Little Help needed moving to and from a bed to a chair (including a wheelchair)?: A Little Help needed standing up from a chair using your arms (e.g., wheelchair or bedside chair)?: A Little Help needed to walk in hospital room?: A Little Help needed climbing 3-5 steps with a railing? : Total 6 Click Score: 17    End of Session Equipment Utilized During Treatment: Gait belt Activity Tolerance: Patient tolerated treatment well;No  increased pain Patient left: Other (comment) (nurse tech aware pt left seated on elevated commode seat in bathroom, pt able to follow instructions to use call bell and await staff assist) Nurse Communication: Mobility status PT Visit Diagnosis: Unsteadiness on feet (R26.81);History of falling (Z91.81);Muscle weakness (generalized) (M62.81)     Time: 8780-8757 PT Time Calculation (min) (ACUTE ONLY): 23 min  Charges:    $Gait Training: 8-22 mins $Therapeutic Activity: 8-22 mins PT General Charges $$ ACUTE PT VISIT: 1 Visit                     [place], PT Acute Rehab    [place] VEAR Drone 01/18/2024, 1:02 PM

## 2024-01-18 NOTE — Progress Notes (Signed)
 Discharge meds in a secure bag delivered to room by this RN

## 2024-01-18 NOTE — TOC Transition Note (Addendum)
 Transition of Care [facility]) - Discharge Note   Patient Details  Name: [name] MRN: [id number] Date of Birth: May 03, 1965  Transition of Care [facility]) CM/SW Contact:  Toy LITTIE Agar, RN Phone Number:[phone]  01/18/2024, 12:25 PM   Clinical Narrative:    Patient discharging home with home health. HH referral has been arranged with [facility]. No DME needs . Patient has walker at home. No other inpatient care management needs noted at this time.    Final next level of care: Home w Home Health Services Barriers to Discharge: No Barriers Identified   Patient Goals and CMS Choice Patient states their goals for this hospitalization and ongoing recovery are:: return home CMS Medicare.gov Compare Post Acute Care list provided to:: Patient Choice offered to / list presented to : Patient  ownership interest in [facility].provided to:: Patient    Discharge Placement                       Discharge Plan and Services Additional resources added to the After Visit Summary for                  DME Arranged: N/A DME Agency: NA       HH Arranged: PT, OT HH Agency: Other - See comment Dam[facility]) Date HH Agency Contacted: 01/18/24 Time HH Agency Contacted: 1224 Representative spoke with at [facility] Agency: Angie  Social Drivers of Health (SDOH) Interventions SDOH Screenings   Food Insecurity: Food Insecurity Present (01/13/2024)  Housing: High Risk (01/13/2024)  Transportation Needs: Unmet Transportation Needs (01/13/2024)  Utilities: Not At Risk (01/13/2024)  Alcohol Screen: Low Risk  (10/30/2020)  Depression (PHQ2-9): Low Risk  (10/24/2023)  Recent Concern: Depression (PHQ2-9) - High Risk (09/14/2023)  Financial Resource Strain: Low Risk  (05/14/2023)  Physical Activity: Insufficiently Active (12/06/2022)  Social Connections: Moderately Integrated (12/06/2022)  Stress: No Stress Concern Present (12/06/2022)  Tobacco Use: Low Risk  (01/15/2024)  Health  Literacy: Adequate Health Literacy (12/06/2022)     Readmission Risk Interventions    01/14/2024    3:09 PM  Readmission Risk Prevention Plan  Post Dischage Appt Complete  Medication Screening Complete  Transportation Screening Complete

## 2024-01-19 ENCOUNTER — Other Ambulatory Visit: Payer: Self-pay | Admitting: Nurse Practitioner

## 2024-01-19 DIAGNOSIS — F322 Major depressive disorder, single episode, severe without psychotic features: Secondary | ICD-10-CM

## 2024-01-19 DIAGNOSIS — R1013 Epigastric pain: Secondary | ICD-10-CM

## 2024-01-21 ENCOUNTER — Telehealth: Payer: Self-pay

## 2024-01-21 ENCOUNTER — Telehealth: Payer: Self-pay | Admitting: *Deleted

## 2024-01-21 NOTE — Transitions of Care (Post Inpatient/ED Visit) (Signed)
 01/21/2024  Name: [name] MRN: [id number] DOB: 16-Sep-1965  Today's TOC FU Call Status: Today's TOC FU Call Status:: Successful TOC FU Call Completed TOC FU Call Complete Date: 01/21/24  Patient's Name and Date of Birth confirmed. Name, DOB  Transition Care Management Follow-up Telephone Call Date of Discharge: 01/18/24 Discharge Facility: [facility]) Type of Discharge: Inpatient Admission Primary Inpatient Discharge Diagnosis:: Cellulitis of left lower leg How have you been since you were released from the hospital?: Better Any questions or concerns?: No  Items Reviewed: Did you receive and understand the discharge instructions provided?: Yes Dietary orders reviewed?: Yes Type of Diet Ordered:: low sodium, heart healthy Do you have support at home?: Yes People in Home [RPT]: alone, child(ren), adult Name of Support/Comfort Primary Source: Daughter and Mother live close by  Medications Reviewed Today: Medications Reviewed Today     Reviewed by Lucky Andrea LABOR, RN (Registered Nurse) on 01/21/24 at 0932  Med List Status: <None>   Medication Order Taking? Sig Documenting Provider Last Dose Status Informant  albuterol  (VENTOLIN  HFA) 108 (90 Base) MCG/ACT inhaler [id number] Yes Inhale 2 puffs into the lungs every 6 (six) hours as needed for wheezing or shortness of breath. Theotis Haze ORN, NP  Active Self, Pharmacy Records  apixaban  (ELIQUIS ) 5 MG TABS tablet [id number] Yes Take 1 tablet (5 mg total) by mouth 2 (two) times daily. Theotis Haze ORN, NP  Active Self, Pharmacy Records  buPROPion  (WELLBUTRIN  XL) 150 MG 24 hr tablet [id number] Yes Take 1 tablet by mouth once daily Newlin, Enobong, MD  Active   docusate sodium  (COLACE) 100 MG capsule [id number] Yes Take 1 capsule (100 mg total) by mouth 2 (two) times daily. Caleen Burgess BROCKS, MD  Active   escitalopram  (LEXAPRO ) 10 MG tablet [id number] Yes Take 1 tablet (10 mg total) by mouth daily. For depression Theotis Haze ORN,  NP  Active Self, Pharmacy Records  ezetimibe  (ZETIA ) 10 MG tablet [id number] Yes Take 1 tablet (10 mg total) by mouth daily. For high cholesterol Theotis Haze ORN, NP  Active Self, Pharmacy Records  famotidine  (PEPCID ) 40 MG tablet [id number] Yes Take 1 tablet by mouth once daily Newlin, Enobong, MD  Active   folic acid  (FOLVITE ) 1 MG tablet [id number] Yes Take 1 tablet by mouth once daily Newlin, Enobong, MD  Active Self, Pharmacy Records  gabapentin  (NEURONTIN ) 100 MG capsule [id number] Yes Take 200 mg by mouth at bedtime. [provider]  Active Self, Pharmacy Records  HYDROcodone -acetaminophen  (NORCO/VICODIN) 5-325 MG tablet [id number] Yes Take 1-2 tablets by mouth every 6 (six) hours as needed for moderate pain (pain score 4-6) or severe pain (pain score 7-10). Caleen Burgess BROCKS, MD  Active   hydroxychloroquine  (PLAQUENIL ) 200 MG tablet [id number] Yes Take 1 tablet (200 mg total) by mouth 2 (two) times daily. Caleen Burgess BROCKS, MD  Active   losartan  (COZAAR ) 25 MG tablet [id number] Yes Take 0.5 tablets (12.5 mg total) by mouth at bedtime. Thukkani, Arun K, MD  Active Self, Pharmacy Records           Med Note [facility], NATHANEL SAILOR   Tue Sep 25, 2023  9:48 PM)    Misc. Devices (BARIATRIC ROLLATOR) MISC [id number] Yes Please provide patient with insurance approved bariatric rollator walker. I89.0, R53.81, R26.81 Theotis Haze ORN, NP  Active Self, Pharmacy Records  polyethylene glycol (MIRALAX ) 17 g packet [id number] Yes Take 17 g by mouth 2 (two) times daily as needed for moderate constipation or severe constipation. Caleen,  Ankit C, MD  Active   predniSONE  (DELTASONE ) 5 MG tablet [id number] Yes Take 5-10 mg by mouth daily after supper.  Patient taking differently: Take 5-10 mg by mouth daily as needed.   [provider]  Active Self, Pharmacy Records           Med Note DELETA, [place] R   Tue Jan 15, 2024 12:21 PM) Recent records indicate 5mg  daily.  sulfamethoxazole -trimethoprim  (BACTRIM  DS) 800-160  MG tablet [id number] Yes Take 1 tablet by mouth every 12 (twelve) hours for 5 days. Caleen Burgess BROCKS, MD  Active             Home Care and Equipment/Supplies: Were Home Health Services Ordered?: Yes Name of Home Health Agency:: [facility] Has Agency set up a time to come to your home?: Yes First Home Health Visit Date: 01/21/24 Any new equipment or medical supplies ordered?: No  Functional Questionnaire: Do you need assistance with bathing/showering or dressing?: No Do you need assistance with meal preparation?: No Do you need assistance with eating?: No Do you have difficulty maintaining continence: No Do you need assistance with getting out of bed/getting out of a chair/moving?: No Do you have difficulty managing or taking your medications?: No  Follow up appointments reviewed: PCP Follow-up appointment confirmed?: Yes Date of PCP follow-up appointment?: 01/23/24 Follow-up Provider: Haze Theotis Driscilla Lionel Follow-up appointment confirmed?: NA Do you need transportation to your follow-up appointment?: No Do you understand care options if your condition(s) worsen?: Yes-patient verbalized understanding  SDOH Interventions Today    Flowsheet Row Most Recent Value  SDOH Interventions   Food Insecurity Interventions Intervention Not Indicated  Housing Interventions Intervention Not Indicated  Transportation Interventions Intervention Not Indicated  Utilities Interventions Intervention Not Indicated    Andrea Dimes RN, BSN [place]  [facility] RN Care Manager [phone]

## 2024-01-21 NOTE — Telephone Encounter (Signed)
 Copied from CRM #[id number]. Topic: Clinical - Home Health Verbal Orders >> Jan 21, 2024  3:50 PM Emylou G wrote: Caller/Agency: krystal w/[facility] Callback Number: [phone] secured line Service Requested: Skilled Nursing Frequency: 2w7 Any new concerns about the patient? Yes - continue the wound care

## 2024-01-22 NOTE — Telephone Encounter (Signed)
 Krystal given verbals order.

## 2024-01-23 ENCOUNTER — Ambulatory Visit: Attending: Nurse Practitioner | Admitting: Nurse Practitioner

## 2024-01-23 ENCOUNTER — Encounter: Payer: Self-pay | Admitting: Nurse Practitioner

## 2024-01-23 ENCOUNTER — Other Ambulatory Visit: Payer: Self-pay | Admitting: Internal Medicine

## 2024-01-23 VITALS — BP 130/82 | HR 81 | Resp 20 | Ht 72.0 in | Wt 339.8 lb

## 2024-01-23 DIAGNOSIS — F32A Depression, unspecified: Secondary | ICD-10-CM

## 2024-01-23 DIAGNOSIS — I890 Lymphedema, not elsewhere classified: Secondary | ICD-10-CM | POA: Diagnosis not present

## 2024-01-23 DIAGNOSIS — Z6841 Body Mass Index (BMI) 40.0 and over, adult: Secondary | ICD-10-CM | POA: Diagnosis not present

## 2024-01-23 DIAGNOSIS — R7303 Prediabetes: Secondary | ICD-10-CM | POA: Diagnosis not present

## 2024-01-23 DIAGNOSIS — F419 Anxiety disorder, unspecified: Secondary | ICD-10-CM

## 2024-01-23 DIAGNOSIS — G9332 Myalgic encephalomyelitis/chronic fatigue syndrome: Secondary | ICD-10-CM | POA: Diagnosis not present

## 2024-01-23 DIAGNOSIS — Z09 Encounter for follow-up examination after completed treatment for conditions other than malignant neoplasm: Secondary | ICD-10-CM | POA: Diagnosis not present

## 2024-01-23 DIAGNOSIS — R1013 Epigastric pain: Secondary | ICD-10-CM

## 2024-01-23 DIAGNOSIS — F322 Major depressive disorder, single episode, severe without psychotic features: Secondary | ICD-10-CM | POA: Diagnosis not present

## 2024-01-23 MED ORDER — ESCITALOPRAM OXALATE 10 MG PO TABS
10.0000 mg | ORAL_TABLET | Freq: Every day | ORAL | 1 refills | Status: AC
Start: 2024-01-23 — End: ?

## 2024-01-23 MED ORDER — BUPROPION HCL ER (XL) 150 MG PO TB24
150.0000 mg | ORAL_TABLET | Freq: Every day | ORAL | 1 refills | Status: AC
Start: 2024-01-23 — End: ?

## 2024-01-23 MED ORDER — FAMOTIDINE 40 MG PO TABS
40.0000 mg | ORAL_TABLET | Freq: Every day | ORAL | 1 refills | Status: AC
Start: 2024-01-23 — End: ?

## 2024-01-23 NOTE — Progress Notes (Signed)
 Assessment & Plan:  [name] was seen today for hypertension.  Diagnoses and all orders for this visit:  Prediabetes -     CMP14+EGFR -     Hemoglobin A1c  Hospital discharge follow-up -     CMP14+EGFR -     CBC with Differential Significant improvement with reduced weeping and better skin condition. Right leg normal. Significant diuresis during hospitalization without diuretics, leading to volume loss. Custom compression garment available but not consistently used. - Continue home health services as per verbal order. - Encouraged use of custom compression garment. - Ordered CBC to monitor white blood cell count for signs of infection. - Ordered CMP to assess electrolytes.  Epigastric pain -     famotidine  (PEPCID ) 40 MG tablet; Take 1 tablet (40 mg total) by mouth daily. INSTRUCTIONS: Avoid GERD Triggers: acidic, spicy or fried foods, caffeine, coffee, sodas,  alcohol and chocolate.    Current severe episode of major depressive disorder without psychotic features without prior episode (HCC) -     buPROPion  (WELLBUTRIN  XL) 150 MG 24 hr tablet; Take 1 tablet (150 mg total) by mouth daily.  Anxiety and depression -     escitalopram  (LEXAPRO ) 10 MG tablet; Take 1 tablet (10 mg total) by mouth daily. For depression  Lymphedema -     For home use only DME 4 wheeled rolling walker with seat ([id number]) Significant improvement with reduced weeping and better skin condition. Right leg normal. Significant diuresis during hospitalization without diuretics, leading to volume loss. Custom compression garment available but not consistently used. - Continue home health services as per verbal order. - Encouraged use of custom compression garment. - Ordered CBC to monitor white blood cell count for signs of infection. - Ordered CMP to assess electrolytes.  Morbid obesity with BMI of 45.0-49.9, adult (HCC) -     For home use only DME 4 wheeled rolling walker with seat ([id number])  Chronic  fatigue syndrome -     For home use only DME 4 wheeled rolling walker with seat ([id number])    Patient has been counseled on age-appropriate routine health concerns for screening and prevention. These are reviewed and up-to-date. Referrals have been placed accordingly. Immunizations are up-to-date or declined.    Subjective:   Chief Complaint  Patient presents with   Hypertension   History of Present Illness [name] is a 58 year old female who presents for follow-up after a recent hospitalization.  She has a past medical history of Anemia, Anxiety, OA,  Asthma, Urinary incontinence with foley in place,  Depression (08/2010), Uterine fibroids, Keloid, Lymphedema (followed by the lymphedema/wound care clinic), and Pulmonary embolism (on chronic Eliquis ) positive rheumatoid factor (followed by rheumatology).    HTN Blood pressure is well-controlled with losartan  12.5 mg daily.   BP Readings from Last 3 Encounters:  01/23/24 130/82  01/18/24 (!) 140/93  09/29/23 (!) 145/75     HFU Admitted  with LLL cellulitis. Placed on IV abx which were converted to PO on discharge. Recommended SNF however she opted to go home with [facility] due to her SNF option. HH has been arranged for her and they have already reached out to schedule. She is still taking po bactrim  as of today. Has instructions to follow up with wound care at [facility] for chronic lymphedema as well  During her hospital stay, she experienced significant diuresis without diuretics, with frequent urination beginning after starting antibiotics, leading to noticeable volume loss. She feels better and has improved mobility  since the volume loss. Wt Readings from Last 3 Encounters:  01/23/24 (!) 339 lb 12.8 oz (154.1 kg)  01/13/24 (!) 374 lb 12.5 oz (170 kg)  09/25/23 (!) 376 lb 15.8 oz (171 kg)  Today she is noting significant improvement in her leg condition. The skin on her legs is improving, though a small spot continues to weep. She  uses a compression garment on her legs for lymphedema but did not wear it today.   She is requesting a new walker. She has limited mobility due to lymph edema in both legs and feet. She experiences wrist pain when attempting to use a rollator with lower handles and she is also 6' tall   She is requesting hormone testing as she  experiences hot flashes primarily at night, which she manages with a ceiling fan, although she sometimes alternates with feeling cold. She is uncertain if these symptoms are due to medication or her age.  The 10-year ASCVD risk score (Arnett DK, et al., 2019) is: 3.3%   Values used to calculate the score:     Age: 58 years     Clincally relevant sex: Female     Is Non-Hispanic African American: Yes     Diabetic: No     Tobacco smoker: No     Systolic Blood Pressure: 130 mmHg     Is BP treated: Yes     HDL Cholesterol: 77 mg/dL     Total Cholesterol: 173 mg/dL   Review of Systems  Constitutional:  Negative for fever, malaise/fatigue and weight loss.  HENT: Negative.  Negative for nosebleeds.   Eyes: Negative.  Negative for blurred vision, double vision and photophobia.  Respiratory: Negative.  Negative for cough and shortness of breath.   Cardiovascular:  Positive for leg swelling. Negative for chest pain and palpitations.  Gastrointestinal: Negative.  Negative for heartburn, nausea and vomiting.  Genitourinary:        Urinary incontinence  Musculoskeletal:  Positive for joint pain. Negative for myalgias.  Neurological: Negative.  Negative for dizziness, focal weakness, seizures and headaches.  Psychiatric/Behavioral: Negative.  Negative for suicidal ideas.     Past Medical History:  Diagnosis Date   Anemia    Anxiety    Arthritis    Asthma    hx as child - no prob as adult - no inhaler   Blood transfusion 01/22/11   transfusion 2 units at [facility]   Depression 08/2010   psych assessment   Dyspnea    Fibroid    Headache(784.0)    rx for imitrex - last  one jan   Keloid    Lymphedema    Pulmonary embolism [facility])     Past Surgical History:  Procedure Laterality Date   ABDOMINAL HYSTERECTOMY     COLONOSCOPY N/A 11/08/2017   Procedure: COLONOSCOPY;  Surgeon: Harvey Margo CROME, MD;  Location: [facility];  Service: Endoscopy;  Laterality: N/A;  2:00pm   FLEXIBLE SIGMOIDOSCOPY N/A 09/17/2017   Procedure: FLEXIBLE SIGMOIDOSCOPY;  Surgeon: Harvey Margo CROME, MD;  Location: [facility];  Service: Endoscopy;  Laterality: N/A;   HERNIA REPAIR  2009   umbilical   POLYPECTOMY  11/08/2017   Procedure: POLYPECTOMY;  Surgeon: Harvey Margo CROME, MD;  Location: [facility];  Service: Endoscopy;;  ascending colon (CSx1), transverse colon (CS x1), splenic flexure (HSx1)   SVD     Spontaneous vaginal delivery; x 1    Family History  Problem Relation Age of Onset   Heart  disease Mother    Hypertension Mother    Hypertension Father    Colon cancer Father 53       Passed away 18 yrs old   Hypertension Sister    Cancer Maternal Grandmother        gastric cancer   Cancer Maternal Grandfather        pancreatic cancer   Colon cancer Paternal Grandmother    Colon cancer Paternal Uncle    Colon polyps Neg Hx     Social History Reviewed with no changes to be made today.   Outpatient Medications Prior to Visit  Medication Sig Dispense Refill   albuterol  (VENTOLIN  HFA) 108 (90 Base) MCG/ACT inhaler Inhale 2 puffs into the lungs every 6 (six) hours as needed for wheezing or shortness of breath. 18 g 2   apixaban  (ELIQUIS ) 5 MG TABS tablet Take 1 tablet (5 mg total) by mouth 2 (two) times daily. 180 tablet 3   docusate sodium  (COLACE) 100 MG capsule Take 1 capsule (100 mg total) by mouth 2 (two) times daily. 60 capsule 0   ezetimibe  (ZETIA ) 10 MG tablet Take 1 tablet (10 mg total) by mouth daily. For high cholesterol 90 tablet 3   folic acid  (FOLVITE ) 1 MG tablet Take 1 tablet by mouth once daily 90 tablet 0   gabapentin  (NEURONTIN ) 100 MG capsule Take 200  mg by mouth at bedtime.     HYDROcodone -acetaminophen  (NORCO/VICODIN) 5-325 MG tablet Take 1-2 tablets by mouth every 6 (six) hours as needed for moderate pain (pain score 4-6) or severe pain (pain score 7-10). 30 tablet 0   hydroxychloroquine  (PLAQUENIL ) 200 MG tablet Take 1 tablet (200 mg total) by mouth 2 (two) times daily.     losartan  (COZAAR ) 25 MG tablet Take 0.5 tablets (12.5 mg total) by mouth at bedtime. 45 tablet 4   Misc. Devices (BARIATRIC ROLLATOR) MISC Please provide patient with insurance approved bariatric rollator walker. I89.0, R53.81, R26.81 1 each 0   polyethylene glycol (MIRALAX ) 17 g packet Take 17 g by mouth 2 (two) times daily as needed for moderate constipation or severe constipation.     sulfamethoxazole -trimethoprim  (BACTRIM  DS) 800-160 MG tablet Take 1 tablet by mouth every 12 (twelve) hours for 5 days. 10 tablet 0   buPROPion  (WELLBUTRIN  XL) 150 MG 24 hr tablet Take 1 tablet by mouth once daily 30 tablet 0   escitalopram  (LEXAPRO ) 10 MG tablet Take 1 tablet (10 mg total) by mouth daily. For depression 90 tablet 1   famotidine  (PEPCID ) 40 MG tablet Take 1 tablet by mouth once daily 30 tablet 0   predniSONE  (DELTASONE ) 5 MG tablet Take 5-10 mg by mouth daily after supper. (Patient taking differently: Take 5-10 mg by mouth daily as needed.)     No facility-administered medications prior to visit.    Allergies  Allergen Reactions   Abatacept Anaphylaxis and Other (See Comments)    Orencia: Anaphylaxis-Streptomycin Clickject    Bee Venom Anaphylaxis   Cefadroxil  Diarrhea   Augmentin  [Amoxicillin -Pot Clavulanate] Hives, Itching and Other (See Comments)    Pt reports no recollection of any reactions when taking penicillin in past   Latex Hives and Other (See Comments)    Local reaction (welts). Patient denies any wheezing or other reaction with latex exposure.   Keflex  [Cephalexin ] Diarrhea, Nausea Only and Other (See Comments)    Causes extreme sleepiness, nausea,  and diarrhea   Nulytely  [Peg 3350 -Kcl-Na Bicarb-Nacl] Nausea And Vomiting and Other (See Comments)  NAUSEA AND VOMITING. MAY TOLERATE LOW VOLUME PREP.   Tomato Other (See Comments)    Caused sores in the mouth       Objective:    BP 130/82 (BP Location: Left Arm, Patient Position: Sitting, Cuff Size: Normal)   Pulse 81   Resp 20   Ht 6' (1.829 m)   Wt (!) 339 lb 12.8 oz (154.1 kg)   LMP 01/31/2011   SpO2 99%   BMI 46.09 kg/m  Wt Readings from Last 3 Encounters:  01/23/24 (!) 339 lb 12.8 oz (154.1 kg)  01/13/24 (!) 374 lb 12.5 oz (170 kg)  09/25/23 (!) 376 lb 15.8 oz (171 kg)    Physical Exam Vitals and nursing note reviewed.  Constitutional:      Appearance: She is well-developed. She is obese.  HENT:     Head: Normocephalic and atraumatic.  Cardiovascular:     Rate and Rhythm: Normal rate and regular rhythm.     Heart sounds: Normal heart sounds. No murmur heard.    No friction rub. No gallop.  Pulmonary:     Effort: Pulmonary effort is normal. No tachypnea or respiratory distress.     Breath sounds: Normal breath sounds. No decreased breath sounds, wheezing, rhonchi or rales.  Chest:     Chest wall: No tenderness.  Abdominal:     General: Bowel sounds are normal.     Palpations: Abdomen is soft.  Musculoskeletal:        General: Normal range of motion.     Cervical back: Normal range of motion.     Comments: B/L Lymph edema  Skin:    General: Skin is warm and dry.  Neurological:     Mental Status: She is alert and oriented to person, place, and time.     Coordination: Coordination normal.  Psychiatric:        Behavior: Behavior normal. Behavior is cooperative.        Thought Content: Thought content normal.        Judgment: Judgment normal.          Patient has been counseled extensively about nutrition and exercise as well as the importance of adherence with medications and regular follow-up. The patient was given clear instructions to go to ER or  return to medical center if symptoms don't improve, worsen or new problems develop. The patient verbalized understanding.   Follow-up: Return in about 3 months (around 04/24/2024).   [name], FNP-BC [facility] [name], [place] [phone]   01/23/2024, 4:20 PM

## 2024-01-24 LAB — CMP14+EGFR
ALT: 14 IU/L (ref 0–32)
AST: 16 IU/L (ref 0–40)
Albumin: 3.8 g/dL (ref 3.8–4.9)
Alkaline Phosphatase: 54 IU/L (ref 49–135)
BUN/Creatinine Ratio: 14 (ref 9–23)
BUN: 21 mg/dL (ref 6–24)
Bilirubin Total: 0.8 mg/dL (ref 0.0–1.2)
CO2: 21 mmol/L (ref 20–29)
Calcium: 9.9 mg/dL (ref 8.7–10.2)
Chloride: 104 mmol/L (ref 96–106)
Creatinine, Ser: 1.48 mg/dL — ABNORMAL HIGH (ref 0.57–1.00)
Globulin, Total: 4.1 g/dL (ref 1.5–4.5)
Glucose: 104 mg/dL — ABNORMAL HIGH (ref 70–99)
Potassium: 4.4 mmol/L (ref 3.5–5.2)
Sodium: 139 mmol/L (ref 134–144)
Total Protein: 7.9 g/dL (ref 6.0–8.5)
eGFR: 42 mL/min/1.73 — ABNORMAL LOW (ref >59)

## 2024-01-24 LAB — CBC WITH DIFFERENTIAL/PLATELET
Basophils Absolute: 0 x10E3/uL (ref 0.0–0.2)
Basos: 1 %
EOS (ABSOLUTE): 0.2 x10E3/uL (ref 0.0–0.4)
Eos: 4 %
Hematocrit: 34.2 % (ref 34.0–46.6)
Hemoglobin: 10.6 g/dL — ABNORMAL LOW (ref 11.1–15.9)
Immature Grans (Abs): 0 x10E3/uL (ref 0.0–0.1)
Immature Granulocytes: 0 %
Lymphocytes Absolute: 1.1 x10E3/uL (ref 0.7–3.1)
Lymphs: 23 %
MCH: 27.9 pg (ref 26.6–33.0)
MCHC: 31 g/dL — ABNORMAL LOW (ref 31.5–35.7)
MCV: 90 fL (ref 79–97)
Monocytes Absolute: 0.5 x10E3/uL (ref 0.1–0.9)
Monocytes: 11 %
Neutrophils Absolute: 3 x10E3/uL (ref 1.4–7.0)
Neutrophils: 61 %
Platelets: 334 x10E3/uL (ref 150–450)
RBC: 3.8 x10E6/uL (ref 3.77–5.28)
RDW: 14.6 % (ref 11.7–15.4)
WBC: 4.8 x10E3/uL (ref 3.4–10.8)

## 2024-01-24 LAB — HEMOGLOBIN A1C
Est. average glucose Bld gHb Est-mCnc: 117 mg/dL
Hgb A1c MFr Bld: 5.7 % — ABNORMAL HIGH (ref 4.8–5.6)

## 2024-01-26 DIAGNOSIS — R0902 Hypoxemia: Secondary | ICD-10-CM | POA: Diagnosis not present

## 2024-01-26 DIAGNOSIS — I213 ST elevation (STEMI) myocardial infarction of unspecified site: Secondary | ICD-10-CM | POA: Diagnosis not present

## 2024-01-26 DIAGNOSIS — R079 Chest pain, unspecified: Secondary | ICD-10-CM | POA: Diagnosis not present

## 2024-01-26 DIAGNOSIS — I499 Cardiac arrhythmia, unspecified: Secondary | ICD-10-CM | POA: Diagnosis not present

## 2024-01-26 DIAGNOSIS — R000 Tachycardia, unspecified: Secondary | ICD-10-CM | POA: Diagnosis not present

## 2024-01-26 DIAGNOSIS — I10 Essential (primary) hypertension: Secondary | ICD-10-CM | POA: Diagnosis not present

## 2024-01-26 DIAGNOSIS — R58 Hemorrhage, not elsewhere classified: Secondary | ICD-10-CM | POA: Diagnosis not present

## 2024-01-27 ENCOUNTER — Observation Stay (HOSPITAL_COMMUNITY)

## 2024-01-27 ENCOUNTER — Emergency Department (HOSPITAL_COMMUNITY)

## 2024-01-27 ENCOUNTER — Ambulatory Visit: Payer: Self-pay | Admitting: Nurse Practitioner

## 2024-01-27 ENCOUNTER — Other Ambulatory Visit: Payer: Self-pay

## 2024-01-27 ENCOUNTER — Observation Stay (HOSPITAL_COMMUNITY)
Admission: EM | Admit: 2024-01-27 | Discharge: 2024-01-27 | Disposition: A | Discharge status: Home / Self Care | Attending: Family Medicine | Admitting: Family Medicine

## 2024-01-27 ENCOUNTER — Encounter (HOSPITAL_COMMUNITY): Payer: Self-pay | Admitting: Internal Medicine

## 2024-01-27 DIAGNOSIS — I3139 Other pericardial effusion (noninflammatory): Secondary | ICD-10-CM

## 2024-01-27 DIAGNOSIS — L03116 Cellulitis of left lower limb: Secondary | ICD-10-CM | POA: Diagnosis not present

## 2024-01-27 DIAGNOSIS — I10 Essential (primary) hypertension: Secondary | ICD-10-CM | POA: Insufficient documentation

## 2024-01-27 DIAGNOSIS — Z86711 Personal history of pulmonary embolism: Secondary | ICD-10-CM | POA: Insufficient documentation

## 2024-01-27 DIAGNOSIS — F419 Anxiety disorder, unspecified: Secondary | ICD-10-CM | POA: Insufficient documentation

## 2024-01-27 DIAGNOSIS — R079 Chest pain, unspecified: Principal | ICD-10-CM | POA: Diagnosis present

## 2024-01-27 DIAGNOSIS — I119 Hypertensive heart disease without heart failure: Secondary | ICD-10-CM | POA: Insufficient documentation

## 2024-01-27 DIAGNOSIS — E785 Hyperlipidemia, unspecified: Secondary | ICD-10-CM | POA: Diagnosis not present

## 2024-01-27 DIAGNOSIS — G8929 Other chronic pain: Secondary | ICD-10-CM | POA: Insufficient documentation

## 2024-01-27 DIAGNOSIS — Z79899 Other long term (current) drug therapy: Secondary | ICD-10-CM | POA: Insufficient documentation

## 2024-01-27 DIAGNOSIS — E66813 Obesity, class 3: Secondary | ICD-10-CM | POA: Diagnosis not present

## 2024-01-27 DIAGNOSIS — D649 Anemia, unspecified: Secondary | ICD-10-CM | POA: Diagnosis not present

## 2024-01-27 DIAGNOSIS — M069 Rheumatoid arthritis, unspecified: Secondary | ICD-10-CM | POA: Diagnosis not present

## 2024-01-27 DIAGNOSIS — I517 Cardiomegaly: Secondary | ICD-10-CM

## 2024-01-27 DIAGNOSIS — G894 Chronic pain syndrome: Secondary | ICD-10-CM | POA: Insufficient documentation

## 2024-01-27 DIAGNOSIS — F32A Depression, unspecified: Secondary | ICD-10-CM | POA: Diagnosis not present

## 2024-01-27 DIAGNOSIS — J45909 Unspecified asthma, uncomplicated: Secondary | ICD-10-CM | POA: Diagnosis not present

## 2024-01-27 DIAGNOSIS — Z86718 Personal history of other venous thrombosis and embolism: Secondary | ICD-10-CM | POA: Diagnosis not present

## 2024-01-27 DIAGNOSIS — I890 Lymphedema, not elsewhere classified: Secondary | ICD-10-CM | POA: Diagnosis not present

## 2024-01-27 DIAGNOSIS — R0789 Other chest pain: Principal | ICD-10-CM | POA: Insufficient documentation

## 2024-01-27 DIAGNOSIS — Z91040 Latex allergy status: Secondary | ICD-10-CM | POA: Diagnosis not present

## 2024-01-27 DIAGNOSIS — R002 Palpitations: Secondary | ICD-10-CM

## 2024-01-27 DIAGNOSIS — Z6841 Body Mass Index (BMI) 40.0 and over, adult: Secondary | ICD-10-CM | POA: Diagnosis not present

## 2024-01-27 DIAGNOSIS — Z7901 Long term (current) use of anticoagulants: Secondary | ICD-10-CM | POA: Insufficient documentation

## 2024-01-27 LAB — IRON AND TIBC
Iron: 46 ug/dL (ref 28–170)
Saturation Ratios: 17 % (ref 10.4–31.8)
TIBC: 277 ug/dL (ref 250–450)
UIBC: 231 ug/dL

## 2024-01-27 LAB — COMPREHENSIVE METABOLIC PANEL WITH GFR
ALT: 14 U/L (ref 0–44)
AST: 17 U/L (ref 15–41)
Albumin: 3.2 g/dL — ABNORMAL LOW (ref 3.5–5.0)
Alkaline Phosphatase: 46 U/L (ref 38–126)
Anion gap: 8 (ref 5–15)
BUN: 24 mg/dL — ABNORMAL HIGH (ref 6–20)
CO2: 23 mmol/L (ref 22–32)
Calcium: 9.6 mg/dL (ref 8.9–10.3)
Chloride: 106 mmol/L (ref 98–111)
Creatinine, Ser: 1.47 mg/dL — ABNORMAL HIGH (ref 0.44–1.00)
GFR, Estimated: 42 mL/min — ABNORMAL LOW (ref >60)
Glucose, Bld: 122 mg/dL — ABNORMAL HIGH (ref 70–99)
Potassium: 4.2 mmol/L (ref 3.5–5.1)
Sodium: 137 mmol/L (ref 135–145)
Total Bilirubin: 0.7 mg/dL (ref 0.0–1.2)
Total Protein: 7.7 g/dL (ref 6.5–8.1)

## 2024-01-27 LAB — CBC
HCT: 28.4 % — ABNORMAL LOW (ref 36.0–46.0)
Hemoglobin: 8.8 g/dL — ABNORMAL LOW (ref 12.0–15.0)
MCH: 28.1 pg (ref 26.0–34.0)
MCHC: 31 g/dL (ref 30.0–36.0)
MCV: 90.7 fL (ref 80.0–100.0)
Platelets: 295 K/uL (ref 150–400)
RBC: 3.13 MIL/uL — ABNORMAL LOW (ref 3.87–5.11)
RDW: 16.6 % — ABNORMAL HIGH (ref 11.5–15.5)
WBC: 4.9 K/uL (ref 4.0–10.5)
nRBC: 0 % (ref 0.0–0.2)

## 2024-01-27 LAB — ECHOCARDIOGRAM COMPLETE
AV Mean grad: 11.6 mmHg
AV Peak grad: 20.6 mmHg
Ao pk vel: 2.27 m/s
Area-P 1/2: 4.24 cm2
S' Lateral: 2.7 cm

## 2024-01-27 LAB — CBC WITH DIFFERENTIAL/PLATELET
Abs Immature Granulocytes: 0.02 K/uL (ref 0.00–0.07)
Basophils Absolute: 0.1 K/uL (ref 0.0–0.1)
Basophils Relative: 1 %
Eosinophils Absolute: 0.3 K/uL (ref 0.0–0.5)
Eosinophils Relative: 5 %
HCT: 32.3 % — ABNORMAL LOW (ref 36.0–46.0)
Hemoglobin: 9.8 g/dL — ABNORMAL LOW (ref 12.0–15.0)
Immature Granulocytes: 0 %
Lymphocytes Relative: 20 %
Lymphs Abs: 1.1 K/uL (ref 0.7–4.0)
MCH: 27.9 pg (ref 26.0–34.0)
MCHC: 30.3 g/dL (ref 30.0–36.0)
MCV: 92 fL (ref 80.0–100.0)
Monocytes Absolute: 0.6 K/uL (ref 0.1–1.0)
Monocytes Relative: 10 %
Neutro Abs: 3.6 K/uL (ref 1.7–7.7)
Neutrophils Relative %: 64 %
Platelets: 339 K/uL (ref 150–400)
RBC: 3.51 MIL/uL — ABNORMAL LOW (ref 3.87–5.11)
RDW: 16.5 % — ABNORMAL HIGH (ref 11.5–15.5)
WBC: 5.6 K/uL (ref 4.0–10.5)
nRBC: 0 % (ref 0.0–0.2)

## 2024-01-27 LAB — FOLATE: Folate: >20 ng/mL (ref >5.9)

## 2024-01-27 LAB — BASIC METABOLIC PANEL WITH GFR
Anion gap: 9 (ref 5–15)
BUN: 22 mg/dL — ABNORMAL HIGH (ref 6–20)
CO2: 22 mmol/L (ref 22–32)
Calcium: 9.1 mg/dL (ref 8.9–10.3)
Chloride: 105 mmol/L (ref 98–111)
Creatinine, Ser: 1.43 mg/dL — ABNORMAL HIGH (ref 0.44–1.00)
GFR, Estimated: 43 mL/min — ABNORMAL LOW (ref >60)
Glucose, Bld: 143 mg/dL — ABNORMAL HIGH (ref 70–99)
Potassium: 4 mmol/L (ref 3.5–5.1)
Sodium: 136 mmol/L (ref 135–145)

## 2024-01-27 LAB — FERRITIN: Ferritin: 150 ng/mL (ref 11–307)

## 2024-01-27 LAB — TROPONIN I (HIGH SENSITIVITY)
Troponin I (High Sensitivity): 12 ng/L (ref <18)
Troponin I (High Sensitivity): 12 ng/L (ref <18)

## 2024-01-27 LAB — LIPID PANEL
Cholesterol: 102 mg/dL (ref 0–200)
HDL: 37 mg/dL — ABNORMAL LOW (ref >40)
LDL Cholesterol: 59 mg/dL (ref 0–99)
Total CHOL/HDL Ratio: 2.8 ratio
Triglycerides: 29 mg/dL (ref <150)
VLDL: 6 mg/dL (ref 0–40)

## 2024-01-27 LAB — VITAMIN B12: Vitamin B-12: <150 pg/mL — ABNORMAL LOW (ref 180–914)

## 2024-01-27 LAB — PROTIME-INR
INR: 1.3 — ABNORMAL HIGH (ref 0.8–1.2)
Prothrombin Time: 17.3 s — ABNORMAL HIGH (ref 11.4–15.2)

## 2024-01-27 LAB — PHOSPHORUS: Phosphorus: 2.8 mg/dL (ref 2.5–4.6)

## 2024-01-27 LAB — MAGNESIUM: Magnesium: 2 mg/dL (ref 1.7–2.4)

## 2024-01-27 LAB — HIV ANTIBODY (ROUTINE TESTING W REFLEX): HIV Screen 4th Generation wRfx: NONREACTIVE

## 2024-01-27 LAB — BRAIN NATRIURETIC PEPTIDE: B Natriuretic Peptide: 144 pg/mL — ABNORMAL HIGH (ref 0.0–100.0)

## 2024-01-27 LAB — CBG MONITORING, ED: Glucose-Capillary: 115 mg/dL — ABNORMAL HIGH (ref 70–99)

## 2024-01-27 MED ORDER — SODIUM CHLORIDE 0.9 % IV BOLUS
500.0000 mL | Freq: Once | INTRAVENOUS | Status: AC
  Administered 2024-01-27: 500 mL via INTRAVENOUS

## 2024-01-27 MED ORDER — MELATONIN 5 MG PO TABS: 5.0000 mg | ORAL_TABLET | Freq: Every evening | ORAL | Status: DC | PRN

## 2024-01-27 MED ORDER — PREDNISONE 5 MG PO TABS
5.0000 mg | ORAL_TABLET | Freq: Once | ORAL | Status: AC
  Administered 2024-01-27: 5 mg via ORAL
  Filled 2024-01-27: qty 1

## 2024-01-27 MED ORDER — FOLIC ACID 1 MG PO TABS
1.0000 mg | ORAL_TABLET | Freq: Every day | ORAL | Status: DC
  Administered 2024-01-27: 1 mg via ORAL
  Filled 2024-01-27: qty 1

## 2024-01-27 MED ORDER — PREDNISONE 5 MG PO TABS: 5.0000 mg | ORAL_TABLET | Freq: Every day | ORAL | Status: DC

## 2024-01-27 MED ORDER — ACETAMINOPHEN 500 MG PO TABS
500.0000 mg | ORAL_TABLET | Freq: Four times a day (QID) | ORAL | Status: DC | PRN
  Filled 2024-01-27: qty 1

## 2024-01-27 MED ORDER — GABAPENTIN 100 MG PO CAPS
200.0000 mg | ORAL_CAPSULE | Freq: Once | ORAL | Status: AC
  Administered 2024-01-27: 200 mg via ORAL
  Filled 2024-01-27: qty 2

## 2024-01-27 MED ORDER — BUPROPION HCL ER (XL) 150 MG PO TB24
150.0000 mg | ORAL_TABLET | Freq: Once | ORAL | Status: AC
  Administered 2024-01-27: 150 mg via ORAL
  Filled 2024-01-27: qty 1

## 2024-01-27 MED ORDER — POLYETHYLENE GLYCOL 3350 17 G PO PACK: 17.0000 g | PACK | Freq: Two times a day (BID) | ORAL | Status: DC | PRN

## 2024-01-27 MED ORDER — ESCITALOPRAM OXALATE 10 MG PO TABS
10.0000 mg | ORAL_TABLET | Freq: Once | ORAL | Status: AC
  Administered 2024-01-27: 10 mg via ORAL
  Filled 2024-01-27: qty 1

## 2024-01-27 MED ORDER — GABAPENTIN 100 MG PO CAPS: 200.0000 mg | ORAL_CAPSULE | Freq: Every day | ORAL | Status: DC

## 2024-01-27 MED ORDER — FAMOTIDINE 20 MG PO TABS
40.0000 mg | ORAL_TABLET | Freq: Every day | ORAL | Status: DC
  Administered 2024-01-27: 40 mg via ORAL
  Filled 2024-01-27: qty 2

## 2024-01-27 MED ORDER — MORPHINE SULFATE (PF) 2 MG/ML IV SOLN: 2.0000 mg | INTRAVENOUS | Status: DC | PRN

## 2024-01-27 MED ORDER — NITROGLYCERIN 0.4 MG SL SUBL
0.4000 mg | SUBLINGUAL_TABLET | SUBLINGUAL | Status: DC | PRN
  Administered 2024-01-27 (×3): 0.4 mg via SUBLINGUAL
  Filled 2024-01-27 (×2): qty 1

## 2024-01-27 MED ORDER — APIXABAN 5 MG PO TABS
5.0000 mg | ORAL_TABLET | Freq: Two times a day (BID) | ORAL | Status: DC
  Administered 2024-01-27: 5 mg via ORAL
  Filled 2024-01-27: qty 1

## 2024-01-27 MED ORDER — SENNOSIDES-DOCUSATE SODIUM 8.6-50 MG PO TABS
2.0000 | ORAL_TABLET | Freq: Every day | ORAL | Status: DC
  Administered 2024-01-27: 2 via ORAL
  Filled 2024-01-27: qty 2

## 2024-01-27 MED ORDER — HYDROCERIN EX CREA
1.0000 | TOPICAL_CREAM | Freq: Two times a day (BID) | CUTANEOUS | Status: DC
  Administered 2024-01-27: 1 via TOPICAL
  Filled 2024-01-27: qty 113

## 2024-01-27 MED ORDER — PROCHLORPERAZINE EDISYLATE 10 MG/2ML IJ SOLN: 5.0000 mg | Freq: Four times a day (QID) | INTRAMUSCULAR | Status: DC | PRN

## 2024-01-27 MED ORDER — LOSARTAN POTASSIUM 25 MG PO TABS: 12.5000 mg | ORAL_TABLET | Freq: Every day | ORAL | Status: DC

## 2024-01-27 MED ORDER — MORPHINE SULFATE (PF) 4 MG/ML IV SOLN
4.0000 mg | Freq: Once | INTRAVENOUS | Status: AC
  Administered 2024-01-27: 4 mg via INTRAVENOUS
  Filled 2024-01-27: qty 1

## 2024-01-27 MED ORDER — ALBUTEROL SULFATE (2.5 MG/3ML) 0.083% IN NEBU: 2.5000 mg | INHALATION_SOLUTION | Freq: Four times a day (QID) | RESPIRATORY_TRACT | Status: DC | PRN

## 2024-01-27 MED ORDER — HYDROXYCHLOROQUINE SULFATE 200 MG PO TABS
200.0000 mg | ORAL_TABLET | Freq: Two times a day (BID) | ORAL | Status: DC
  Administered 2024-01-27: 200 mg via ORAL
  Filled 2024-01-27 (×2): qty 1

## 2024-01-27 MED ORDER — BUPROPION HCL ER (XL) 150 MG PO TB24: 150.0000 mg | ORAL_TABLET | Freq: Every day | ORAL | Status: DC

## 2024-01-27 MED ORDER — EZETIMIBE 10 MG PO TABS
10.0000 mg | ORAL_TABLET | Freq: Every day | ORAL | Status: DC
  Administered 2024-01-27: 10 mg via ORAL
  Filled 2024-01-27: qty 1

## 2024-01-27 MED ORDER — OXYCODONE HCL 5 MG PO TABS: 5.0000 mg | ORAL_TABLET | Freq: Four times a day (QID) | ORAL | Status: DC | PRN

## 2024-01-27 MED ORDER — ESCITALOPRAM OXALATE 10 MG PO TABS: 10.0000 mg | ORAL_TABLET | Freq: Every day | ORAL | Status: DC

## 2024-01-27 NOTE — Plan of Care (Signed)

## 2024-01-27 NOTE — Care Management Obs Status (Signed)
 MEDICARE OBSERVATION STATUS NOTIFICATION   Patient Details  Name: [name] MRN: [id number] Date of Birth: 1966/02/06   Medicare Observation Status Notification Given:  Yes    Mckala Pantaleon G., RN 01/27/2024, 9:49 AM

## 2024-01-27 NOTE — Progress Notes (Addendum)
 Pt reports chast pain 6/10 squeezing to left anterior chest states she's had the pain since change of shift but didn't say anything because she thought it would get better. 1 nitroglycerin  sl  given  vitals as noted in flow  sheets   2nd nitroglycerin  sl administered at 0950 for chest pain of 3/10  presently rates pain at 0/10 am meds adminsitered as well educated pt to call asap if she felt increaing chest pain again and also on the effects of nitroglycerin  (headaches BOP drops dizziness_

## 2024-01-27 NOTE — Plan of Care (Signed)

## 2024-01-27 NOTE — Consult Note (Signed)
 Cardiology Consultation   Patient ID: DEVAEH AMADI MRN: [id number]; DOB: [birth year]  Admit date: 01/27/2024 Date of Consult: 01/27/2024  PCP:  Theotis Haze ORN, NP   [facility] Providers Cardiologist:  Lurena MARLA Red, MD        Patient Profile: [name] is a 58 y.o. female with a history of chest pain with normal coronaries on coronary CTA in 04/2021, hypertension, hyperlipidemia, prior PE/ DVT on chronic anticoagulation with Eliquis , lymphedema, rheumatoid arthritis, chronic anemia, and anxiety/depression who is being seen 01/27/2024 for the evaluation of chest pain at the request of Dr. Perri.  History of Present Illness: [name] is a 58 year old female with the above history.  She has a history of chest pain. Coronary CTA in 04/2021 showed a coronary calcium  score of 0 with no evidence of CAD.  Last Echo in 10/2022 showed LVEF of 65 to 65% with mild LVH, normal RV function, no significant valvular disease, and a small pericardial effusion.  Patient has underlying severe lymphedema and has had 4 hospitalizations for lower extremity cellulitis since 10/2022 with associated sepsis during 2 of these admissions. Last admission was from 01/03/2024 to 01/18/2024. She reported some atypical chest pain during that admission. High-sensitivity troponin was only minimally elevated and flat in the 20s not consistent with ACS. She did not require a Cardiology consult.  Patient presented back to the ED earlier this morning for further evaluation of shortness of breath, chest tightness, and palpitations.  On arrival to the ED, EKG showed normal sinus rhythm, rate 90 bpm, with LVH and associated repolarization but no acute ischemic changes.  Sensitive troponin negative x 2 (12 >> 12).  BNP mildly elevated at 144.  Chest x-ray showed stable cardiomegaly with no acute findings. WBC 5.6, Hgb 9.8, Plts 339. Na 137, K 4.2, Glucose 122, BUN 24, Cr 1.47, Albumin 3.2, AST 17, ALT 14, Alk  Phos 46, Total Bili 0.7. Patient admitted and  Cardiology consulted for further evaluation.  Patient states she had sudden onset of palpitations where she felt like her heart was beating outside of her chest and left-sided chest pain that she describes as a squeezing sensation late last night and between 11 PM and midnight.  She had associated shortness of breath and lightheadedness with this and called 911.  Symptoms improved with sublingual Nitroglycerin .  She reports a similar episode of palpitations and chest pain in the fast when she was at the gym but otherwise no similar episodes.  She had recurrent symptoms earlier this morning and was given 2 doses of sublingual nitro with improvement.  She ambulates with a walker and denies any chest pain or shortness of breath with this.  No orthopnea PND.  She is chronic severe lower extremity edema due to lymphedema but states it actually looks better since recent discharge.  She denies any lightheadedness or dizziness outside of the episode last night.  No syncope.  No recent fevers or illnesses.  She reports 1 episode of bloody stools a few weeks ago in the setting of constipation this is an isolated event.  No other abnormal bleeding.  She is on chronic anticoagulation with Eliquis  for prior PE/DVT in 2018 and reports compliance with this.  She states her mom has a history of an enlarged heart. No other family history of heart disease. She denies any history of tobacco use.   Past Medical History:  Diagnosis Date   Anemia    Anxiety    Arthritis  Asthma    hx as child - no prob as adult - no inhaler   Blood transfusion 01/22/11   transfusion 2 units at [facility]   Depression 08/2010   psych assessment   Dyspnea    Fibroid    Headache(784.0)    rx for imitrex - last one jan   Keloid    Lymphedema    Pulmonary embolism [facility])     Past Surgical History:  Procedure Laterality Date   ABDOMINAL HYSTERECTOMY     COLONOSCOPY N/A 11/08/2017   Procedure:  COLONOSCOPY;  Surgeon: Harvey Margo CROME, MD;  [facility]: [facility];  Service: Endoscopy;  Laterality: N/A;  2:00pm   FLEXIBLE SIGMOIDOSCOPY N/A 09/17/2017   Procedure: FLEXIBLE SIGMOIDOSCOPY;  Surgeon: Harvey Margo CROME, MD;  Location: [facility];  Service: Endoscopy;  Laterality: N/A;   HERNIA REPAIR  2009   umbilical   POLYPECTOMY  11/08/2017   Procedure: POLYPECTOMY;  Surgeon: Harvey Margo CROME, MD;  Location: [facility];  Service: Endoscopy;;  ascending colon (CSx1), transverse colon (CS x1), splenic flexure (HSx1)   SVD     Spontaneous vaginal delivery; x 1     Home Medications:  Prior to Admission medications   Medication Sig Start Date End Date Taking? Authorizing Provider  acetaminophen  (TYLENOL ) 500 MG tablet Take 500 mg by mouth every 6 (six) hours as needed for mild pain (pain score 1-3) or moderate pain (pain score 4-6).   Yes [provider]  albuterol  (VENTOLIN  HFA) 108 (90 Base) MCG/ACT inhaler Inhale 2 puffs into the lungs every 6 (six) hours as needed for wheezing or shortness of breath. 09/14/23  Yes Theotis Haze ORN, NP  apixaban  (ELIQUIS ) 5 MG TABS tablet Take 1 tablet (5 mg total) by mouth 2 (two) times daily. 06/13/23  Yes Theotis Haze ORN, NP  buPROPion  (WELLBUTRIN  XL) 150 MG 24 hr tablet Take 1 tablet (150 mg total) by mouth daily. Patient taking differently: Take 150 mg by mouth at bedtime. 01/23/24  Yes Theotis Haze ORN, NP  docusate sodium  (COLACE) 100 MG capsule Take 1 capsule (100 mg total) by mouth 2 (two) times daily. Patient taking differently: Take 100 mg by mouth at bedtime. 01/18/24 01/17/25 Yes Amin, Ankit C, MD  escitalopram  (LEXAPRO ) 10 MG tablet Take 1 tablet (10 mg total) by mouth daily. For depression Patient taking differently: Take 10 mg by mouth at bedtime. For depression 01/23/24  Yes Theotis Haze ORN, NP  ezetimibe  (ZETIA ) 10 MG tablet Take 1 tablet (10 mg total) by mouth daily. For high cholesterol Patient taking differently: Take 10 mg  by mouth at bedtime. For high cholesterol 06/13/23  Yes Theotis Haze ORN, NP  famotidine  (PEPCID ) 40 MG tablet Take 1 tablet (40 mg total) by mouth daily. Patient taking differently: Take 40 mg by mouth at bedtime. 01/23/24  Yes Fleming, [name] W, NP  folic acid  (FOLVITE ) 1 MG tablet Take 1 tablet by mouth once daily Patient taking differently: Take 1 mg by mouth at bedtime. 12/05/23  Yes Newlin, Enobong, MD  gabapentin  (NEURONTIN ) 100 MG capsule Take 200 mg by mouth at bedtime. 05/31/23 05/30/24 Yes [provider]  HYDROcodone -acetaminophen  (NORCO/VICODIN) 5-325 MG tablet Take 1-2 tablets by mouth every 6 (six) hours as needed for moderate pain (pain score 4-6) or severe pain (pain score 7-10). 01/18/24  Yes Amin, Ankit C, MD  hydroxychloroquine  (PLAQUENIL ) 200 MG tablet Take 1 tablet (200 mg total) by mouth 2 (two) times daily. 01/17/24  Yes Amin,  Ankit C, MD  losartan  (COZAAR ) 25 MG tablet Take 0.5 tablets (12.5 mg total) by mouth at bedtime. 12/01/22  Yes Thukkani, Arun K, MD  polyethylene glycol (MIRALAX ) 17 g packet Take 17 g by mouth 2 (two) times daily as needed for moderate constipation or severe constipation. 01/17/24  Yes Amin, Ankit C, MD  predniSONE  (DELTASONE ) 5 MG tablet Take 5 mg by mouth at bedtime.   Yes [provider]    Scheduled Meds:  apixaban   5 mg Oral BID   buPROPion   150 mg Oral QHS   escitalopram   10 mg Oral QHS   ezetimibe   10 mg Oral Daily   famotidine   40 mg Oral Daily   folic acid   1 mg Oral Daily   gabapentin   200 mg Oral QHS   hydrocerin  1 Application Topical BID   hydroxychloroquine   200 mg Oral BID   losartan   12.5 mg Oral QHS   predniSONE   5 mg Oral QHS   senna-docusate  2 tablet Oral QHS   Continuous Infusions:  PRN Meds: acetaminophen , albuterol , melatonin, morphine  injection, nitroGLYCERIN , oxyCODONE , polyethylene glycol, prochlorperazine   Allergies:    Allergies  Allergen Reactions   Abatacept Anaphylaxis and Other (See Comments)     Orencia: Anaphylaxis-Streptomycin Clickject    Bee Venom Anaphylaxis   Cefadroxil  Diarrhea   Keflex  [Cephalexin ] Diarrhea, Nausea Only and Other (See Comments)    Causes extreme sleepiness, nausea, and diarrhea   Augmentin  [Amoxicillin -Pot Clavulanate] Hives, Itching and Other (See Comments)    Pt reports no recollection of any reactions when taking penicillin in past   Latex Hives and Other (See Comments)    Local reaction (welts). Patient denies any wheezing or other reaction with latex exposure.   Nulytely  [Peg 3350 -Kcl-Na Bicarb-Nacl] Nausea And Vomiting and Other (See Comments)    NAUSEA AND VOMITING. MAY TOLERATE LOW VOLUME PREP.   Tomato Other (See Comments)    Caused sores in the mouth    Social History:   Social History   Socioeconomic History   Marital status: Single    Spouse name: Not on file   Number of children: 1   Years of education: Not on file   Highest education level: Not on file  Occupational History   Occupation: medical billing    Employer: [employer]  Tobacco Use   Smoking status: Never   Smokeless tobacco: Never  Vaping Use   Vaping status: Never Used  Substance and Sexual Activity   Alcohol use: No   Drug use: No   Sexual activity: Not Currently    Birth control/protection: None, Surgical  Other Topics Concern   Not on file  Social History Narrative   Not on file   Social Drivers of Health   Financial Resource Strain: Low Risk  (05/14/2023)   Overall Financial Resource Strain (CARDIA)    Difficulty of Paying Living Expenses: Not very hard  Food Insecurity: Unknown (01/27/2024)   Hunger Vital Sign    Worried About Running Out of Food in the Last Year: Never true    Ran Out of Food in the Last Year: Not on file  Recent Concern: Food Insecurity - Food Insecurity Present (01/13/2024)   Hunger Vital Sign    Worried About Running Out of Food in the Last Year: Sometimes true    Ran Out of Food in the Last Year: Sometimes true  Transportation  Needs: No Transportation Needs (01/27/2024)   PRAPARE - Administrator, Civil Service (Medical):  No    Lack of Transportation (Non-Medical): No  Recent Concern: Transportation Needs - Unmet Transportation Needs (01/13/2024)   PRAPARE - Administrator, Civil Service (Medical): Yes    Lack of Transportation (Non-Medical): No  Physical Activity: Insufficiently Active (12/06/2022)   Exercise Vital Sign    Days of Exercise per Week: 3 days    Minutes of Exercise per Session: 30 min  Stress: No Stress Concern Present (12/06/2022)   [employer] of Occupational Health - Occupational Stress Questionnaire    Feeling of Stress : Not at all  Social Connections: Moderately Integrated (12/06/2022)   Social Connection and Isolation Panel    Frequency of Communication with Friends and Family: More than three times a week    Frequency of Social Gatherings with Friends and Family: Twice a week    Attends Religious Services: 1 to 4 times per year    Active Member of [employer] or Organizations: Yes    Attends Banker Meetings: 1 to 4 times per year    Marital Status: Never married  Intimate Partner Violence: Not At Risk (01/27/2024)   Humiliation, Afraid, Rape, and Kick questionnaire    Fear of Current or Ex-Partner: No    Emotionally Abused: No    Physically Abused: No    Sexually Abused: No    Family History:   Family History  Problem Relation Age of Onset   Heart disease Mother    Hypertension Mother    Hypertension Father    Colon cancer Father 20       Passed away 78 yrs old   Hypertension Sister    Cancer Maternal Grandmother        gastric cancer   Cancer Maternal Grandfather        pancreatic cancer   Colon cancer Paternal Grandmother    Colon cancer Paternal Uncle    Colon polyps Neg Hx      ROS:  Please see the history of present illness.   Physical Exam/Data: Vitals:   01/27/24 0837 01/27/24 0942 01/27/24 0947 01/27/24 0956  BP: 138/61  (!) 143/62 131/62 (!) 128/56  Pulse: 84 86  87  Resp: 14     Temp: 98.6 F (37 C)     TempSrc: Oral     SpO2: 97% 94%  92%   No intake or output data in the 24 hours ending 01/27/24 1030    01/23/2024   10:14 AM 01/13/2024    5:50 PM 09/25/2023    2:47 PM  Last 3 Weights  Weight (lbs) 339 lb 12.8 oz 374 lb 12.5 oz 376 lb 15.8 oz  Weight (kg) 154.132 kg 170 kg 171 kg     There is no height or weight on file to calculate BMI.  General: 58 y.o. morbidly obese African-American female resting comfortably in no acute distress. HEENT: Normocephalic and atraumatic. Sclera clear. Neck: Supple. JVD difficult to assess due to body habitus and large keloid scar on right side of neck. Heart: RRR. II-III/VI systolic murmur.  Lungs: No increased work of breathing. Clear to ausculation bilaterally. No wheezes, rhonchi, or rales.  Extremities: Significant lymphedema bilaterally (left leg wrapped). Skin: Warm and dry. Neuro: Alert and oriented x3. No focal deficits. Psych: Normal affect. Responds appropriately.  EKG:  The EKG was personally reviewed and demonstrates:  Normal sinus rhythm, rate 90 bpm, with LVH and associated repolarization but no acute ischemic changes. Telemetry:  Telemetry was personally reviewed and demonstrates: Normal sinus rhythm  with rates in the 80s to 90s.  Relevant CV Studies:  Coronary CTA 05/19/2021: Impressions: 1. Coronary calcium  score of 0. This was 0 percentile for age-, sex, and race-matched controls. 2. Normal coronary origin with right dominance. 3. No evidence of CAD. 4. Small pericardial effusion. _______________  Echocardiogram 11/08/2022: Impressions:  1. Left ventricular ejection fraction, by estimation, is 60 to 65%. The  left ventricle has normal function. The left ventricle has no regional  wall motion abnormalities. There is mild left ventricular hypertrophy.  Left ventricular diastolic parameters  were normal. The average left ventricular  global longitudinal strain is  -20.0 %. The global longitudinal strain is normal.   2. Right ventricular systolic function is normal. The right ventricular  size is normal. There is normal pulmonary artery systolic pressure.   3. A small pericardial effusion is present.   4. The mitral valve is normal in structure. No evidence of mitral valve  regurgitation.   5. Very mild aortic valve mean gradient of 9 mmHg. The aortic valve is  grossly normal. Aortic valve regurgitation is not visualized.   6. The inferior vena cava is normal in size with greater than 50%  respiratory variability, suggesting right atrial pressure of 3 mmHg.    Laboratory Data: High Sensitivity Troponin:   Recent Labs  Lab 01/27/24 0020 01/27/24 0218  TROPONINIHS 12 12     Chemistry Recent Labs  Lab 01/23/24 1049 01/27/24 0020 01/27/24 0548  NA 139 137 136  K 4.4 4.2 4.0  CL 104 106 105  CO2 21 23 22   GLUCOSE 104* 122* 143*  BUN 21 24* 22*  CREATININE 1.48* 1.47* 1.43*  CALCIUM  9.9 9.6 9.1  MG  --   --  2.0  GFRNONAA  --  42* 43*  ANIONGAP  --  8 9    Recent Labs  Lab 01/23/24 1049 01/27/24 0020  PROT 7.9 7.7  ALBUMIN 3.8 3.2*  AST 16 17  ALT 14 14  ALKPHOS 54 46  BILITOT 0.8 0.7   Lipids No results for input(s): CHOL, TRIG, HDL, LABVLDL, LDLCALC, CHOLHDL in the last 168 hours.  Hematology Recent Labs  Lab 01/23/24 1049 01/27/24 0020 01/27/24 0548  WBC 4.8 5.6 4.9  RBC 3.80 3.51* 3.13*  HGB 10.6* 9.8* 8.8*  HCT 34.2 32.3* 28.4*  MCV 90 92.0 90.7  MCH 27.9 27.9 28.1  MCHC 31.0* 30.3 31.0  RDW 14.6 16.5* 16.6*  PLT 334 339 295   Thyroid  No results for input(s): TSH, FREET4 in the last 168 hours.  BNP Recent Labs  Lab 01/27/24 0020  BNP 144.0*    DDimer No results for input(s): DDIMER in the last 168 hours.  Radiology/Studies:  DG Chest Port 1 View Result Date: 01/27/2024 CLINICAL DATA:  Chest pain. EXAM: PORTABLE CHEST 1 VIEW COMPARISON:  November 16, 2022 FINDINGS: The cardiac silhouette is mildly enlarged and unchanged in size. No acute infiltrate, pleural effusion or pneumothorax is identified. There is stable moderate severity dextroscoliosis of the lower thoracic spine. IMPRESSION: Stable cardiomegaly without acute or active cardiopulmonary disease. Electronically Signed   By: Suzen Dials M.D.   On: 01/27/2024 01:21     Assessment and Plan:  Chest Pain Palpitations Patient presented for sudden onset of palpitations and left-sided chest pain.  EKG shows normal sinus rhythm with LVH and repolarization but no acute ischemic changes.  High-sensitivity troponin negative x 2. - Currently chest pain-free. - Echo pending. - She has some atypical and typical  features.  Pain seems to be correlated with intense palpitations and she denies any dyspnea outside of these palpitations but pain did resolve with nitroglycerin .  No arrhythmias noted on EKG or telemetry.  Prior coronary CTA in 04/2021 showed a coronary calcium  score of 0 i no evidence of CAD.  Can wait for echo results.  If EF and wall motion normal, can consider repeat coronary CTA.  Given troponin is negative, this can be done as an outpatient.  May also benefit from a outpatient monitor to assess for any arrhythmias.  Hypertension BP mostly well controlled.  - Continue Losartan  12.5mg  daily.  Hyperlipidemia Lipid panel this admission: Total Cholesterol 102, Triglycerides 29, HDL 37, LDL 59. - Continue home Zetia  10mg  daily.  Otherwise, per primary team: - Severe lymphedema - Type 2 diabetes mellitus - Chronic anemia - History of PE/ DVT: on chronic Eliquis   Risk Assessment/Risk Scores:   For questions or updates, please contact [facility] Please consult [email or web address] for contact info under      Signed, Masen Salvas E Tatyanna Cronk, PA-C  01/27/2024 10:30 AM

## 2024-01-27 NOTE — ED Notes (Signed)
 Pt states her chest pain has gotten worse and is feeling dizzy. MD Lawsing notified

## 2024-01-27 NOTE — ED Provider Notes (Signed)
 [place] EMERGENCY DEPARTMENT AT [facility] Provider Note   CSN: [id number] Arrival date & time: 01/27/24  0010     Patient presents with: Chest Pain   [name] is a 58 y.o. female.    Chest Pain Associated symptoms: diaphoresis, palpitations and shortness of breath      58 year old female with medical history significant for morbid obesity, lymphedema, PE on Eliquis , anxiety, depression, HTN, rheumatoid arthritis presenting to the emergency department with a chief complaint of chest pain and shortness of breath.  The patient states that for the past 24 hours she has had intermittent shortness of breath with chest tightness and heart palpitations.  Symptoms started around 2 AM last night and persisted intermittently throughout the day.  She called EMS when she developed worsening 7 out of 10 chest pain on the left side with no radiation.  She was administered 2 sublingual nitroglycerin  with subsequent improvement and now endorses 2 out of 10 pain.  She is on Eliquis  and was not administered aspirin .  She endorses mild shortness of breath with associated lightheadedness.  She endorses palpitations.  She denies any cough fever or chills.  Prior to Admission medications   Medication Sig Start Date End Date Taking? Authorizing Provider  albuterol  (VENTOLIN  HFA) 108 (90 Base) MCG/ACT inhaler Inhale 2 puffs into the lungs every 6 (six) hours as needed for wheezing or shortness of breath. 09/14/23   Fleming, [name] W, NP  apixaban  (ELIQUIS ) 5 MG TABS tablet Take 1 tablet (5 mg total) by mouth 2 (two) times daily. 06/13/23   Fleming, [name] W, NP  buPROPion  (WELLBUTRIN  XL) 150 MG 24 hr tablet Take 1 tablet (150 mg total) by mouth daily. 01/23/24   Fleming, [name] W, NP  docusate sodium  (COLACE) 100 MG capsule Take 1 capsule (100 mg total) by mouth 2 (two) times daily. 01/18/24 01/17/25  Amin, Ankit C, MD  escitalopram  (LEXAPRO ) 10 MG tablet Take 1 tablet (10 mg total) by mouth  daily. For depression 01/23/24   Theotis Haze ORN, NP  ezetimibe  (ZETIA ) 10 MG tablet Take 1 tablet (10 mg total) by mouth daily. For high cholesterol 06/13/23   Theotis Haze ORN, NP  famotidine  (PEPCID ) 40 MG tablet Take 1 tablet (40 mg total) by mouth daily. 01/23/24   Fleming, [name] W, NP  folic acid  (FOLVITE ) 1 MG tablet Take 1 tablet by mouth once daily 12/05/23   Newlin, Enobong, MD  gabapentin  (NEURONTIN ) 100 MG capsule Take 200 mg by mouth at bedtime. 05/31/23 05/30/24  [provider]  HYDROcodone -acetaminophen  (NORCO/VICODIN) 5-325 MG tablet Take 1-2 tablets by mouth every 6 (six) hours as needed for moderate pain (pain score 4-6) or severe pain (pain score 7-10). 01/18/24   Amin, Ankit C, MD  hydroxychloroquine  (PLAQUENIL ) 200 MG tablet Take 1 tablet (200 mg total) by mouth 2 (two) times daily. 01/17/24   Amin, Ankit C, MD  losartan  (COZAAR ) 25 MG tablet Take 0.5 tablets (12.5 mg total) by mouth at bedtime. 12/01/22   Thukkani, Arun K, MD  Misc. Devices (BARIATRIC ROLLATOR) MISC Please provide patient with insurance approved bariatric rollator walker. I89.0, R53.81, R26.81 11/05/19   Fleming, [name] W, NP  polyethylene glycol (MIRALAX ) 17 g packet Take 17 g by mouth 2 (two) times daily as needed for moderate constipation or severe constipation. 01/17/24   Amin, Ankit C, MD    Allergies: Abatacept, Bee venom, Cefadroxil , Augmentin  [amoxicillin -pot clavulanate], Latex, Keflex  [cephalexin ], Nulytely  [peg 3350 -kcl-na bicarb-nacl], and Tomato  Review of Systems  Constitutional:  Positive for diaphoresis.  Respiratory:  Positive for shortness of breath.   Cardiovascular:  Positive for chest pain and palpitations.  All other systems reviewed and are negative.   Updated Vital Signs BP 123/70   Pulse 91   Temp 97.6 F (36.4 C) (Oral)   Resp 18   LMP 01/31/2011   SpO2 99%   Physical Exam Vitals and nursing note reviewed.  Constitutional:      General: She is not in acute  distress.    Appearance: She is well-developed. She is obese.  HENT:     Head: Normocephalic and atraumatic.  Eyes:     Conjunctiva/sclera: Conjunctivae normal.  Cardiovascular:     Rate and Rhythm: Normal rate and regular rhythm.     Heart sounds: Murmur heard.  Pulmonary:     Effort: Pulmonary effort is normal. No respiratory distress.     Breath sounds: Normal breath sounds.  Abdominal:     Palpations: Abdomen is soft.     Tenderness: There is no abdominal tenderness.  Musculoskeletal:     Cervical back: Neck supple.     Right lower leg: Edema present.     Left lower leg: Edema present.     Comments: Bilateral lower extremity lymphedema present which appears chronic  Skin:    General: Skin is warm and dry.     Capillary Refill: Capillary refill takes less than 2 seconds.  Neurological:     Mental Status: She is alert.  Psychiatric:        Mood and Affect: Mood normal.     (all labs ordered are listed, but only abnormal results are displayed) Labs Reviewed  BRAIN NATRIURETIC PEPTIDE - Abnormal; Notable for the following components:      Result Value   B Natriuretic Peptide 144.0 (*)    All other components within normal limits  PROTIME-INR - Abnormal; Notable for the following components:   Prothrombin Time 17.3 (*)    INR 1.3 (*)    All other components within normal limits  CBC WITH DIFFERENTIAL/PLATELET - Abnormal; Notable for the following components:   RBC 3.51 (*)    Hemoglobin 9.8 (*)    HCT 32.3 (*)    RDW 16.5 (*)    All other components within normal limits  COMPREHENSIVE METABOLIC PANEL WITH GFR - Abnormal; Notable for the following components:   Glucose, Bld 122 (*)    BUN 24 (*)    Creatinine, Ser 1.47 (*)    Albumin 3.2 (*)    GFR, Estimated 42 (*)    All other components within normal limits  CBG MONITORING, ED - Abnormal; Notable for the following components:   Glucose-Capillary 115 (*)    All other components within normal limits  TROPONIN I  (HIGH SENSITIVITY)  TROPONIN I (HIGH SENSITIVITY)    EKG: EKG Interpretation Date/Time:  Sunday January 27 2024 01:51:28 EST Ventricular Rate:  93 PR Interval:  171 QRS Duration:  130 QT Interval:  389 QTC Calculation: 484 R Axis:   -57  Text Interpretation: Sinus rhythm LVH with IVCD, LAD and secondary repol abnrm Nonspecific ST changes, EKG looks similar to prior,  no STEMI Confirmed by Jerrol Agent ([id number]) on 01/27/2024 2:32:50 AM  Radiology: [facility] Chest Port 1 View Result Date: 01/27/2024 CLINICAL DATA:  Chest pain. EXAM: PORTABLE CHEST 1 VIEW COMPARISON:  November 16, 2022 FINDINGS: The cardiac silhouette is mildly enlarged and unchanged in size. No acute infiltrate, pleural effusion  or pneumothorax is identified. There is stable moderate severity dextroscoliosis of the lower thoracic spine. IMPRESSION: Stable cardiomegaly without acute or active cardiopulmonary disease. Electronically Signed   By: Suzen Dials M.D.   On: 01/27/2024 01:21     Procedures   Medications Ordered in the ED  nitroGLYCERIN  (NITROSTAT ) SL tablet 0.4 mg (0.4 mg Sublingual Given 01/27/24 0149)  morphine  (PF) 4 MG/ML injection 4 mg (4 mg Intravenous Given 01/27/24 0235)  sodium chloride  0.9 % bolus 500 mL (0 mLs Intravenous Stopped 01/27/24 0316)                                    Medical Decision Making Amount and/or Complexity of Data Reviewed Labs: ordered. Radiology: ordered.  Risk Prescription drug management. Decision regarding hospitalization.    58 year old female with medical history significant for morbid obesity, lymphedema, PE on Eliquis , anxiety, depression, HTN, rheumatoid arthritis presenting to the emergency department with a chief complaint of chest pain and shortness of breath.  The patient states that for the past 24 hours she has had intermittent shortness of breath with chest tightness and heart palpitations.  Symptoms started around 2 AM last night and persisted  intermittently throughout the day.  She called EMS when she developed worsening 7 out of 10 chest pain on the left side with no radiation.  She was administered 2 sublingual nitroglycerin  with subsequent improvement and now endorses 2 out of 10 pain.  She is on Eliquis  and was not administered aspirin .  She endorses mild shortness of breath with associated lightheadedness.  She endorses palpitations.  She denies any cough fever or chills.  On arrival, the patient was afebrile, not tachycardic or tachypneic, initially hypertensive BP 161/58, improved to 123/70, saturating 99% on room air.  Physical exam generally unremarkable beyond bilateral lower extremity lymphedema which is chronic.  Initial Assessment: With the patient's presentation of left-sided chest pain, most likely diagnosis is musculoskeletal chest pain versus GERD, although ACS remains on the differential. Other diagnoses were considered including (but not limited to) pulmonary embolism, community-acquired pneumonia, aortic dissection, pneumothorax, underlying bony abnormality, anemia. These are considered less likely due to history of present illness and physical exam findings.    In particular, concerning pulmonary embolism: Patient has a history of PE, is already anticoagulated on Eliquis . Aortic Dissection also considered but seems less likely based on the location, quality, onset, and severity of symptoms in this case.   Initial Plan: Evaluate for ACS with delta troponin and EKG evaluated as below  Evaluate for dissection, bony abnormality, or pneumonia with chest x-ray and screening laboratory evaluation including CBC, CMP, BNP Further evaluation for pulmonary embolism not indicated at this time based on patient's current [facility] status Further evaluation for Thoracic Aortic Dissection not indicated at this time based on patient's clinical history and PE findings.   Initial Study Results: EKG was reviewed independently. Rate, rhythm,  axis, intervals all examined and without medically relevant abnormality. Nonspecific ST changes noted in the inferior leads, no STEMI, confirmed with cardiology.   Laboratory  Delta troponin demonstrated normal values.  BNP nonspecifically mildly elevated to 144. CBC and CMP without obvious metabolic or inflammatory abnormalities requiring further evaluation   Radiology  DG Chest Port 1 View Result Date: 01/27/2024 CLINICAL DATA:  Chest pain. EXAM: PORTABLE CHEST 1 VIEW COMPARISON:  November 16, 2022 FINDINGS: The cardiac silhouette is mildly enlarged and unchanged in size. No  acute infiltrate, pleural effusion or pneumothorax is identified. There is stable moderate severity dextroscoliosis of the lower thoracic spine. IMPRESSION: Stable cardiomegaly without acute or active cardiopulmonary disease. Electronically Signed   By: Suzen Dials M.D.   On: 01/27/2024 01:21    Final Assessment and Plan: Patient administered NaCl bolus, sublingual nitroglycerin , IV morphine  with some improvement in pain.  She endorses persistent 1 out of 10 chest discomfort described as a pressure sensation.  Patient chest pain had improved with nitroglycerin  earlier this evening.  In the setting of this, medicine was consulted for admission for observation given ongoing chest discomfort, Dr. Shona accepting.      Final diagnoses:  Chest pain, unspecified type    ED Discharge Orders     None          Jerrol Agent, MD 01/27/24 [phone]

## 2024-01-27 NOTE — Discharge Summary (Signed)
 Physician Discharge Summary  [name] FMW:[id number] DOB: [birth year] DOA: 01/27/2024  PCP: Theotis Haze ORN, NP  Admit date: 01/27/2024 Discharge date: 01/27/2024  Time spent: 40 minutes  Recommendations for Outpatient Follow-up:  Follow outpatient CBC/CMP  Follow with cardiology outpatient  Follow A1c and vitamin b12   Discharge Diagnoses:  Principal Problem:   Chest pain Active Problems:   LVH (left ventricular hypertrophy)   Chronic pericardial effusion   Palpitations   Discharge Condition: stable  Diet recommendation: heart healthy  There were no vitals filed for this visit.  History of present illness:   [name] is [name] 58 y.o. female with medical history significant for lymphedema, hypertension, history of pulmonary embolism/right lower extremity DVT on Eliquis , chronic anxiety/depression, morbid obesity, rheumatoid arthritis, ambulatory dysfunction uses [name] cane at baseline, who presented to the ER due to chest pain.   Negative troponins, echo without RWMA.    Appreciate cardiology eval, plan for outpatient cards follow up.   Hospital Course:  Assessment and Plan:  Atypical chest pain rule out ACS Negative trops Echo with EF 65-70%, no RWMA, moderate LVH, trivial pericardial effusion Continue eliquis  for now A1c pending (A1c 11/26 5.7), LDL 59 Appreciate cardiology consult - suspect noncardiac CP.  They recommend considering outpatient evaluation for amyloid or sarcoid.  Considering outpatient monitor for palpitations.   She's asymptomatic at time of discharge without CP or SOB, I don't think CT chest PE protocol needed   History of pulmonary embolism/right lower extremity DVT Continue home Eliquis  - she notes compliance with her home eliquis    Anemia Noted rectal bleeding recently - now resolved as of [name] couple of weeks Hb fluctuates in past, ranging from ~8 -10 Trend Iron  wnl, b12 pending, folate wnl, ferritin wnl   Chronic bilateral  lower extremity lymphedema Recently diagnosed with left lower extremity cellulitis Afebrile with no leukocytosis.  Recently completed 10 days of antibiotics. Doing well  Appreciate WOC consult   Hypertension losartan    Dyslipidemia Zetia    Rheumatoid arthritis Prednisone  5 mg, plaquenil    Chronic Pain Gabapentin    Chronic anxiety/depression Wellbutrin , lexapro    Ambulatory dysfunction Uses [name] cane at baseline Fall precautions.   Class III Obesity There is no height or weight on file to calculate BMI.    Procedures: Echo IMPRESSIONS     1. Left ventricular ejection fraction, by estimation, is 65 to 70%. Left  ventricular ejection fraction by PLAX is 66 %. The left ventricle has  normal function. The left ventricle has no regional wall motion  abnormalities. There is moderate left  ventricular hypertrophy. Left ventricular diastolic parameters were  normal.   2. Right ventricular systolic function is normal. The right ventricular  size is normal. There is normal pulmonary artery systolic pressure. The  estimated right ventricular systolic pressure is 31.4 mmHg.   3. Trivial pericardial effusion, worse anteriorly. Fat pad noted at the  RV apex.. There is no evidence of cardiac tamponade.   4. The mitral valve is grossly normal. Trivial mitral valve  regurgitation.   5. The aortic valve is tricuspid. Aortic valve regurgitation is not  visualized.   6. The inferior vena cava is normal in size with <50% respiratory  variability, suggesting right atrial pressure of 8 mmHg.   Comparison(s): Changes from prior study are noted. 11/08/2022: LVEF 60-65%,  small pericardial effusion.    Consultations: cardiology  Discharge Exam: Vitals:   01/27/24 0956 01/27/24 1233  BP: (!) 128/56 (!) 117/59  Pulse: 87 83  Resp:  14  Temp:  98.5 F (36.9 C)  SpO2: 92% 98%   Discussed after cards consult, CP and SOB resolved.   Discussed discharge plan   Discharge  Instructions   Discharge Instructions     Call MD for:  difficulty breathing, headache or visual disturbances   Complete by: As directed    Call MD for:  extreme fatigue   Complete by: As directed    Call MD for:  hives   Complete by: As directed    Call MD for:  persistant dizziness or light-headedness   Complete by: As directed    Call MD for:  persistant nausea and vomiting   Complete by: As directed    Call MD for:  redness, tenderness, or signs of infection (pain, swelling, redness, odor or green/yellow discharge around incision site)   Complete by: As directed    Call MD for:  severe uncontrolled pain   Complete by: As directed    Call MD for:  temperature >100.4   Complete by: As directed    Diet - low sodium heart healthy   Complete by: As directed    Discharge instructions   Complete by: As directed    You had chest pain.  You had [name] reassuring workup with negative cardiac enzymes and an ultrasound without wall motion abnormalities.    You were seen by cardiology, they're recommending [name] continued outpatient workup of your chest discomfort and palpitations.  As well as outpatient evaluation for amyloid or sarcoid.    There's an A1c pending at the time of discharge (diabetes test) that you can follow with your PCP as an outpatient.  Your LDL (bad cholesterol) is at goal.  You have [name] pending vitamin b12 at the time of discharge, follow this up with your PCP outpatient.   Return for new, recurrent or worsening symptoms.  Please ask your PCP to request records from this hospitalization so they know what was done and what the next steps will be.   Discharge wound care:   Complete by: As directed    Cleanse bilateral lower legs with soap and water , dry and apply Eucerin cream to dry scaly skin 2 times daily. If wound on L posterior calf open/draining clean with NS and apply silver hydrofiber [name]) to wound bed daily and secure with silicone foam or Kerlix roll gauze    Increase activity slowly   Complete by: As directed       Allergies as of 01/27/2024       Reactions   Abatacept Anaphylaxis, Other (See Comments)   Orencia: Anaphylaxis-Streptomycin Clickject   Bee Venom Anaphylaxis   Cefadroxil  Diarrhea   Keflex  [cephalexin ] Diarrhea, Nausea Only, Other (See Comments)   Causes extreme sleepiness, nausea, and diarrhea   Augmentin  [amoxicillin -pot Clavulanate] Hives, Itching, Other (See Comments)   Pt reports no recollection of any reactions when taking penicillin in past   Latex Hives, Other (See Comments)   Local reaction (welts). Patient denies any wheezing or other reaction with latex exposure.   Nulytely  [peg 3350 -kcl-na Bicarb-nacl] Nausea And Vomiting, Other (See Comments)   NAUSEA AND VOMITING. MAY TOLERATE LOW VOLUME PREP.   Tomato Other (See Comments)   Caused sores in the mouth        Medication List     TAKE these medications    acetaminophen  500 MG tablet Commonly known as: TYLENOL  Take 500 mg by mouth every 6 (six) hours as needed for mild pain (pain score  1-3) or moderate pain (pain score 4-6).   albuterol  108 (90 Base) MCG/ACT inhaler Commonly known as: VENTOLIN  HFA Inhale 2 puffs into the lungs every 6 (six) hours as needed for wheezing or shortness of breath.   apixaban  5 MG Tabs tablet Commonly known as: Eliquis  Take 1 tablet (5 mg total) by mouth 2 (two) times daily.   buPROPion  150 MG 24 hr tablet Commonly known as: WELLBUTRIN  XL Take 1 tablet (150 mg total) by mouth daily. What changed: when to take this   docusate sodium  100 MG capsule Commonly known as: Colace Take 1 capsule (100 mg total) by mouth 2 (two) times daily. What changed: when to take this   escitalopram  10 MG tablet Commonly known as: Lexapro  Take 1 tablet (10 mg total) by mouth daily. For depression What changed: when to take this   ezetimibe  10 MG tablet Commonly known as: Zetia  Take 1 tablet (10 mg total) by mouth daily. For high  cholesterol What changed: when to take this   famotidine  40 MG tablet Commonly known as: PEPCID  Take 1 tablet (40 mg total) by mouth daily. What changed: when to take this   folic acid  1 MG tablet Commonly known as: FOLVITE  Take 1 tablet by mouth once daily What changed: when to take this   gabapentin  100 MG capsule Commonly known as: NEURONTIN  Take 200 mg by mouth at bedtime.   HYDROcodone -acetaminophen  5-325 MG tablet Commonly known as: NORCO/VICODIN Take 1-2 tablets by mouth every 6 (six) hours as needed for moderate pain (pain score 4-6) or severe pain (pain score 7-10).   hydroxychloroquine  200 MG tablet Commonly known as: PLAQUENIL  Take 1 tablet (200 mg total) by mouth 2 (two) times daily.   losartan  25 MG tablet Commonly known as: COZAAR  Take 0.5 tablets (12.5 mg total) by mouth at bedtime.   polyethylene glycol 17 g packet Commonly known as: MiraLax  Take 17 g by mouth 2 (two) times daily as needed for moderate constipation or severe constipation.   predniSONE  5 MG tablet Commonly known as: DELTASONE  Take 5 mg by mouth at bedtime.               Discharge Care Instructions  (From admission, onward)           Start     Ordered   01/27/24 0000  Discharge wound care:       Comments: Cleanse bilateral lower legs with soap and water , dry and apply Eucerin cream to dry scaly skin 2 times daily. If wound on L posterior calf open/draining clean with NS and apply silver hydrofiber Lauren [phone]) to wound bed daily and secure with silicone foam or Kerlix roll gauze   01/27/24 1351           Allergies  Allergen Reactions   Abatacept Anaphylaxis and Other (See Comments)    Orencia: Anaphylaxis-Streptomycin Clickject    Bee Venom Anaphylaxis   Cefadroxil  Diarrhea   Keflex  [Cephalexin ] Diarrhea, Nausea Only and Other (See Comments)    Causes extreme sleepiness, nausea, and diarrhea   Augmentin  [Amoxicillin -Pot Clavulanate] Hives, Itching and Other (See  Comments)    Pt reports no recollection of any reactions when taking penicillin in past   Latex Hives and Other (See Comments)    Local reaction (welts). Patient denies any wheezing or other reaction with latex exposure.   Nulytely  [Peg 3350 -Kcl-Na Bicarb-Nacl] Nausea And Vomiting and Other (See Comments)    NAUSEA AND VOMITING. MAY TOLERATE LOW VOLUME PREP.  Tomato Other (See Comments)    Caused sores in the mouth      The results of significant diagnostics from this hospitalization (including imaging, microbiology, ancillary and laboratory) are listed below for reference.    Significant Diagnostic Studies: ECHOCARDIOGRAM COMPLETE Result Date: 01/27/2024    ECHOCARDIOGRAM REPORT   Patient Name:   [name] Date of Exam: 01/27/2024 Medical Rec #:  [id number]         Height:       72.0 in Accession #:    [id number]        Weight:       339.8 lb Date of Birth:  [birth year]        BSA:          2.669 m Patient Age:    57 years          BP:           154/64 mmHg Patient Gender: F                 HR:           80 bpm. Exam Location:  Inpatient Procedure: 2D Echo, Color Doppler and Cardiac Doppler (Both Spectral and Color            Flow Doppler were utilized during procedure). Indications:    Chest Pain  History:        Patient has prior history of Echocardiogram examinations, most                 recent 11/08/2022. Signs/Symptoms:Shortness of Breath, Dyspnea                 and Chest Pain; Risk Factors:Hypertension.  Sonographer:    Philomena Daring Referring Phys: [id number] CAROLE N HALL IMPRESSIONS  1. Left ventricular ejection fraction, by estimation, is 65 to 70%. Left ventricular ejection fraction by PLAX is 66 %. The left ventricle has normal function. The left ventricle has no regional wall motion abnormalities. There is moderate left ventricular hypertrophy. Left ventricular diastolic parameters were normal.  2. Right ventricular systolic function is normal. The right ventricular size is  normal. There is normal pulmonary artery systolic pressure. The estimated right ventricular systolic pressure is 31.4 mmHg.  3. Trivial pericardial effusion, worse anteriorly. Fat pad noted at the RV apex.. There is no evidence of cardiac tamponade.  4. The mitral valve is grossly normal. Trivial mitral valve regurgitation.  5. The aortic valve is tricuspid. Aortic valve regurgitation is not visualized.  6. The inferior vena cava is normal in size with <50% respiratory variability, suggesting right atrial pressure of 8 mmHg. Comparison(s): Changes from prior study are noted. 11/08/2022: LVEF 60-65%, small pericardial effusion. FINDINGS  Left Ventricle: Left ventricular ejection fraction, by estimation, is 65 to 70%. Left ventricular ejection fraction by PLAX is 66 %. The left ventricle has normal function. The left ventricle has no regional wall motion abnormalities. The left ventricular internal cavity size was normal in size. There is moderate left ventricular hypertrophy. Left ventricular diastolic parameters were normal. Indeterminate filling pressures. Right Ventricle: The right ventricular size is normal. No increase in right ventricular wall thickness. Right ventricular systolic function is normal. There is normal pulmonary artery systolic pressure. The tricuspid regurgitant velocity is 2.42 m/s, and  with an assumed right atrial pressure of 8 mmHg, the estimated right ventricular systolic pressure is 31.4 mmHg. Left [facility]: Left atrial size was normal in size. Right [facility]: Right atrial size was normal  in size. Pericardium: Trivial pericardial effusion, worse anteriorly. Fat pad noted at the RV apex. Trivial pericardial effusion is present. The pericardial effusion is circumferential. There is no evidence of cardiac tamponade. Presence of epicardial fat layer. Mitral Valve: The mitral valve is grossly normal. Trivial mitral valve regurgitation. Tricuspid Valve: The tricuspid valve is grossly normal. Tricuspid  valve regurgitation is trivial. Aortic Valve: The aortic valve is tricuspid. Aortic valve regurgitation is not visualized. Aortic valve mean gradient measures 11.6 mmHg. Aortic valve peak gradient measures 20.6 mmHg. Pulmonic Valve: The pulmonic valve was normal in structure. Pulmonic valve regurgitation is not visualized. Aorta: The aortic root and ascending aorta are structurally normal, with no evidence of dilitation. Venous: The inferior vena cava is normal in size with less than 50% respiratory variability, suggesting right atrial pressure of 8 mmHg. IAS/Shunts: No atrial level shunt detected by color flow Doppler.  LEFT VENTRICLE PLAX 2D LV EF:         Left            Diastology                ventricular     LV e' medial:    9.68 cm/s                ejection        LV E/e' medial:  11.3                fraction by     LV e' lateral:   9.25 cm/s                PLAX is 66      LV E/e' lateral: 11.8                %. LVIDd:         4.20 cm LVIDs:         2.70 cm LV PW:         1.30 cm LV IVS:        1.40 cm LVOT diam:     2.05 cm LVOT Area:     3.30 cm  RIGHT VENTRICLE             IVC RV Basal diam:  4.09 cm     IVC diam: 1.95 cm RV Mid diam:    2.98 cm RV S prime:     15.70 cm/s TAPSE (M-mode): 2.3 cm LEFT ATRIUM           Index        RIGHT ATRIUM           Index LA diam:      3.88 cm 1.45 cm/m   RA Area:     23.30 cm LA Vol (A2C): 50.9 ml 19.07 ml/m  RA Volume:   68.10 ml  25.52 ml/m LA Vol (A4C): 58.6 ml 21.96 ml/m  AORTIC VALVE AV Vmax:      227.00 cm/s AV Vmean:     155.800 cm/s AV VTI:       0.465 m AV Peak Grad: 20.6 mmHg AV Mean Grad: 11.6 mmHg  AORTA Ao Root diam: 3.01 cm Ao Asc diam:  3.15 cm MITRAL VALVE                TRICUSPID VALVE MV Area (PHT): 4.24 cm     TR Peak grad:   23.4 mmHg MV Decel Time: 179 msec     TR Vmax:  242.00 cm/s MV E velocity: 109.00 cm/s MV Osman Calzadilla velocity: 133.00 cm/s  SHUNTS MV E/Caleb Decock ratio:  0.82         Systemic Diam: 2.05 cm Vinie Maxcy MD Electronically signed by  Vinie Maxcy MD Signature Date/Time: 01/27/2024/12:38:03 PM    Final    DG Chest Port 1 View Result Date: 01/27/2024 CLINICAL DATA:  Chest pain. EXAM: PORTABLE CHEST 1 VIEW COMPARISON:  November 16, 2022 FINDINGS: The cardiac silhouette is mildly enlarged and unchanged in size. No acute infiltrate, pleural effusion or pneumothorax is identified. There is stable moderate severity dextroscoliosis of the lower thoracic spine. IMPRESSION: Stable cardiomegaly without acute or active cardiopulmonary disease. Electronically Signed   By: Suzen Dials M.D.   On: 01/27/2024 01:21    Microbiology: No results found for this or any previous visit (from the past 240 hours).   Labs: Basic Metabolic Panel: Recent Labs  Lab 01/23/24 1049 01/27/24 0020 01/27/24 0548  NA 139 137 136  K 4.4 4.2 4.0  CL 104 106 105  CO2 21 23 22   GLUCOSE 104* 122* 143*  BUN 21 24* 22*  CREATININE 1.48* 1.47* 1.43*  CALCIUM  9.9 9.6 9.1  MG  --   --  2.0  PHOS  --   --  2.8   Liver Function Tests: Recent Labs  Lab 01/23/24 1049 01/27/24 0020  AST 16 17  ALT 14 14  ALKPHOS 54 46  BILITOT 0.8 0.7  PROT 7.9 7.7  ALBUMIN 3.8 3.2*   No results for input(s): LIPASE, AMYLASE in the last 168 hours. No results for input(s): AMMONIA in the last 168 hours. CBC: Recent Labs  Lab 01/23/24 1049 01/27/24 0020 01/27/24 0548  WBC 4.8 5.6 4.9  NEUTROABS 3.0 3.6  --   HGB 10.6* 9.8* 8.8*  HCT 34.2 32.3* 28.4*  MCV 90 92.0 90.7  PLT 334 339 295   Cardiac Enzymes: No results for input(s): CKTOTAL, CKMB, CKMBINDEX, TROPONINI in the last 168 hours. BNP: BNP (last 3 results) Recent Labs    01/27/24 0020  BNP 144.0*    ProBNP (last 3 results) No results for input(s): PROBNP in the last 8760 hours.  CBG: Recent Labs  Lab 01/27/24 0221  GLUCAP 115*       Signed:  Meliton Monte MD.  [facility] 01/27/2024, 1:51 PM

## 2024-01-27 NOTE — Progress Notes (Signed)
 PROGRESS NOTE    [name]  FMW:[id number] DOB: [birth year] DOA: 01/27/2024 PCP: [name] ORN, NP  Chief Complaint  Patient presents with   Chest Pain    Brief Narrative:   [name] is [name] 58 y.o. female with medical history significant for lymphedema, hypertension, history of pulmonary embolism/right lower extremity DVT on Eliquis , chronic anxiety/depression, morbid obesity, rheumatoid arthritis, ambulatory dysfunction uses [name] cane at baseline, who presented to the ER due to chest pain.  Assessment & Plan:   Principal Problem:   Chest pain  Atypical chest pain rule out ACS nitroglycerin  responsive Trops negative x2 Follow transthoracic echocardiogram. Will consult cardiology, appreciate assistance Continue eliquis  for now A1c, lipid panel If workup unrevealing and persistent symptoms, consider CT PE protocol   History of pulmonary embolism/right lower extremity DVT Continue home Eliquis  - she notes compliance with her home eliquis    Anemia Noted rectal bleeding recently - now resolved as of [name] couple of weeks Hb fluctuates in past, ranging from ~8 -10 Trend Iron , b12, folate, ferritin   Chronic bilateral lower extremity lymphedema Recently diagnosed with left lower extremity cellulitis Afebrile with no leukocytosis.  Recently completed 10 days of antibiotics. Appreciate WOC consult   Hypertension losartan    Dyslipidemia Zetia   Rheumatoid arthritis Prednisone  5 mg, plaquenil    Chronic Pain Gabapentin   Chronic anxiety/depression Wellbutrin , lexapro    Ambulatory dysfunction Uses [name] cane at baseline Fall precautions.  Class III Obesity There is no height or weight on file to calculate BMI.     DVT prophylaxis: eliquis  Code Status: full Family Communication: none Disposition:   Status is: Observation The patient remains OBS appropriate and will d/c before 2 midnights.   Consultants:  cardiology  Procedures:   none  Antimicrobials:  Anti-infectives (From admission, onward)    Start     Dose/Rate Route Frequency Ordered Stop   01/27/24 0430  hydroxychloroquine  (PLAQUENIL ) tablet 200 mg        200 mg Oral 2 times daily 01/27/24 0417         Subjective: Notes 6/10 chest pain   Objective: Vitals:   01/27/24 0300 01/27/24 0400 01/27/24 0532 01/27/24 0837  BP: 122/63 (!) 108/56 (!) 143/61 138/61  Pulse: 85 92 91 84  Resp:   14 14  Temp:   99.2 F (37.3 C) 98.6 F (37 C)  TempSrc:   Oral Oral  SpO2: 98%  98% 97%   No intake or output data in the 24 hours ending 01/27/24 0937 There were no vitals filed for this visit.  Examination:  General exam: Appears calm and comfortable  Respiratory system: unlabored Cardiovascular system: RRR - CP not exactly reproduced with palpation Central nervous system: Alert and oriented. No focal neurological deficits. Extremities: chronic bilateral lymphedema, LLE wrapped with ACE wrap    Data Reviewed: I have personally reviewed following labs and imaging studies  CBC: Recent Labs  Lab 01/23/24 1049 01/27/24 0020 01/27/24 0548  WBC 4.8 5.6 4.9  NEUTROABS 3.0 3.6  --   HGB 10.6* 9.8* 8.8*  HCT 34.2 32.3* 28.4*  MCV 90 92.0 90.7  PLT 334 339 295    Basic Metabolic Panel: Recent Labs  Lab 01/23/24 1049 01/27/24 0020 01/27/24 0548  NA 139 137 136  K 4.4 4.2 4.0  CL 104 106 105  CO2 21 23 22   GLUCOSE 104* 122* 143*  BUN 21 24* 22*  CREATININE 1.48* 1.47* 1.43*  CALCIUM  9.9 9.6 9.1  MG  --   --  2.0  PHOS  --   --  2.8    GFR: Estimated Creatinine Clearance: 74 mL/min ([name]) (by C-G formula based on SCr of 1.43 mg/dL (H)).  Liver Function Tests: Recent Labs  Lab 01/23/24 1049 01/27/24 0020  AST 16 17  ALT 14 14  ALKPHOS 54 46  BILITOT 0.8 0.7  PROT 7.9 7.7  ALBUMIN 3.8 3.2*    CBG: Recent Labs  Lab 01/27/24 0221  GLUCAP 115*     No results found for this or any previous visit (from the past 240 hours).        Radiology Studies: DG Chest Port 1 View Result Date: 01/27/2024 CLINICAL DATA:  Chest pain. EXAM: PORTABLE CHEST 1 VIEW COMPARISON:  November 16, 2022 FINDINGS: The cardiac silhouette is mildly enlarged and unchanged in size. No acute infiltrate, pleural effusion or pneumothorax is identified. There is stable moderate severity dextroscoliosis of the lower thoracic spine. IMPRESSION: Stable cardiomegaly without acute or active cardiopulmonary disease. Electronically Signed   By: Suzen Dials M.D.   On: 01/27/2024 01:21        Scheduled Meds:  apixaban   5 mg Oral BID   buPROPion   150 mg Oral QHS   escitalopram   10 mg Oral QHS   ezetimibe   10 mg Oral Daily   famotidine   40 mg Oral Daily   folic acid   1 mg Oral Daily   gabapentin   200 mg Oral QHS   hydrocerin  1 Application Topical BID   hydroxychloroquine   200 mg Oral BID   losartan   12.5 mg Oral QHS   predniSONE   5 mg Oral QHS   senna-docusate  2 tablet Oral QHS   Continuous Infusions:   LOS: 0 days    Time spent: over 30 min     Lauren Monte, MD [facility]   To contact the attending provider between 7A-7P or the covering provider during after hours 7P-7A, please log into the web site [email or web address] and access using universal [place] password for that web site. If you do not have the password, please call the hospital operator.  01/27/2024, 9:37 AM

## 2024-01-27 NOTE — Consult Note (Addendum)
   WOC Nurse Consult Note: patient with known history of lymphedema followed by [facility] clinic for ongoing manual therapy and wound care; last seen there 01/01/2024; utilizing silver to wound and compression stockings  Reason for Consult: lymphedema  Wound type: partial thickness L posterior lower leg r/t venous insufficiency (healing)  Pressure Injury POA: NA not pressure  Measurement: see nursing flowsheet  Wound bed: pink  Drainage (amount, consistency, odor) scant serosanguinous per bedside nurse   Periwound: edema and changes consistent with lymphedema Dressing procedure/placement/frequency: Cleanse B lower legs with soap and water , dry and apply Eucerin cream to dry scaly skin 2 times daily. If wound on L posterior calf open/draining clean with NS and apply silver hydrofiber Soila [phone]) to wound bed daily and secure with silicone foam or Kerlix roll gauze.     Lymphedema is outside the scope of the WOC nurse. Patient should continue to follow with [facility] for ongoing management of lymphedema/wounds.       POC discussed with bedside nurse. WOC team will not follow. Re-consult if further needs arise.     .        Thank you,    Wilho Sharpley MSN, RN-BC, CWOCN

## 2024-01-27 NOTE — ED Triage Notes (Signed)
 Patient coming from home. Patient was going to restroom when she became [facility] with chest tightness and palpitations. Patient was able to meet EMS at the door. Patient was having 7/10 chest pain on the left center side with no radiation. Given 2 nitroglycerin  by EMS, chest pain improved to a 2. Recent rectal bleeding noted so EMS held aspirin . EMS VS 98 HR 136/72 BP 99% RA

## 2024-01-27 NOTE — H&P (Addendum)
 History and Physical  [name] FMW:[id number] DOB: 1965-12-15 DOA: 01/27/2024  Referring physician: Dr. Jerrol, EDP  PCP: Theotis Haze ORN, NP  Outpatient Specialists: Wound care, vascular surgery. Patient coming from: Home.  Chief Complaint: Chest pain.  HPI: [name] is a 58 y.o. female with medical history significant for lymphedema, hypertension, history of pulmonary embolism/right lower extremity DVT on Eliquis , chronic anxiety/depression, morbid obesity, rheumatoid arthritis, ambulatory dysfunction uses a cane at baseline, who presents to the ER due to chest pain.  Onset around 11:30 PM 01/26/2024.  States this also happened last night, and resolved after she took her home dose of losartan .    She describes her chest pain as squeezing, centrally located, nonradiating 7-8 out of 10 in severity.  Associated with shortness of breath and palpitations.  EMS was activated.  She states after receiving 1 dose of sublingual nitroglycerin  her pain went from 7 out of 10 to 5 out of 10.  After receiving the second dose of sublingual nitroglycerin  her pain went from 5 out of 10 to 3 out of 10.    In the ER, vital signs are stable, not hypoxic.  She received morphine  and her pain level went down to 2 out of 10.  Twelve-lead EKG with no evidence of acute ischemia.  High-sensitivity troponin 12, 12.  Chest x-ray with no acute cardiopulmonary disease.  EDP requested admission for chest pain, rule out ACS.  Admitted by [facility], hospitalist service.  To note, the patient was admitted from 11/16 through 01/18/2024 for left lower extremity cellulitis in the setting of lymphedema.  She was treated with IV vancomycin  and switched to Bactrim  at the time of discharge, to complete 10 day course of antibiotic.  ED Course: Temperature 97.6.  BP 122/63, pulse 85, respiration rate 18, O2 saturation 98% on room air.  Review of Systems: Review of systems as noted in the HPI. All other systems reviewed  and are negative.   Past Medical History:  Diagnosis Date   Anemia    Anxiety    Arthritis    Asthma    hx as child - no prob as adult - no inhaler   Blood transfusion 01/22/11   transfusion 2 units at [facility]   Depression 08/2010   psych assessment   Dyspnea    Fibroid    Headache(784.0)    rx for imitrex - last one jan   Keloid    Lymphedema    Pulmonary embolism [facility])    Past Surgical History:  Procedure Laterality Date   ABDOMINAL HYSTERECTOMY     COLONOSCOPY N/A 11/08/2017   Procedure: COLONOSCOPY;  Surgeon: Harvey Margo CROME, MD;  Location: [facility] ENDO [facility];  Service: Endoscopy;  Laterality: N/A;  2:00pm   FLEXIBLE SIGMOIDOSCOPY N/A 09/17/2017   Procedure: FLEXIBLE SIGMOIDOSCOPY;  Surgeon: Harvey Margo CROME, MD;  Location: [facility] ENDO SUITE;  Service: Endoscopy;  Laterality: N/A;   HERNIA REPAIR  2009   umbilical   POLYPECTOMY  11/08/2017   Procedure: POLYPECTOMY;  Surgeon: Harvey Margo CROME, MD;  Location: [facility];  Service: Endoscopy;;  ascending colon (CSx1), transverse colon (CS x1), splenic flexure (HSx1)   SVD     Spontaneous vaginal delivery; x 1    Social History:  reports that she has never smoked. She has never used smokeless tobacco. She reports that she does not drink alcohol and does not use drugs.   Allergies  Allergen Reactions   Abatacept Anaphylaxis and Other (See Comments)  Orencia: Anaphylaxis-Streptomycin Clickject    Bee Venom Anaphylaxis   Cefadroxil  Diarrhea   Augmentin  [Amoxicillin -Pot Clavulanate] Hives, Itching and Other (See Comments)    Pt reports no recollection of any reactions when taking penicillin in past   Latex Hives and Other (See Comments)    Local reaction (welts). Patient denies any wheezing or other reaction with latex exposure.   Keflex  [Cephalexin ] Diarrhea, Nausea Only and Other (See Comments)    Causes extreme sleepiness, nausea, and diarrhea   Nulytely  [Peg 3350 -Kcl-Na Bicarb-Nacl] Nausea And Vomiting and Other (See Comments)     NAUSEA AND VOMITING. MAY TOLERATE LOW VOLUME PREP.   Tomato Other (See Comments)    Caused sores in the mouth    Family History  Problem Relation Age of Onset   Heart disease Mother    Hypertension Mother    Hypertension Father    Colon cancer Father 66       Passed away 64 yrs old   Hypertension Sister    Cancer Maternal Grandmother        gastric cancer   Cancer Maternal Grandfather        pancreatic cancer   Colon cancer Paternal Grandmother    Colon cancer Paternal Uncle    Colon polyps Neg Hx       Prior to Admission medications   Medication Sig Start Date End Date Taking? Authorizing Provider  albuterol  (VENTOLIN  HFA) 108 (90 Base) MCG/ACT inhaler Inhale 2 puffs into the lungs every 6 (six) hours as needed for wheezing or shortness of breath. 09/14/23   Fleming, [name] W, NP  apixaban  (ELIQUIS ) 5 MG TABS tablet Take 1 tablet (5 mg total) by mouth 2 (two) times daily. 06/13/23   Fleming, [name] W, NP  buPROPion  (WELLBUTRIN  XL) 150 MG 24 hr tablet Take 1 tablet (150 mg total) by mouth daily. 01/23/24   Theotis Haze ORN, NP  docusate sodium  (COLACE) 100 MG capsule Take 1 capsule (100 mg total) by mouth 2 (two) times daily. 01/18/24 01/17/25  Amin, Ankit C, MD  escitalopram  (LEXAPRO ) 10 MG tablet Take 1 tablet (10 mg total) by mouth daily. For depression 01/23/24   Theotis Haze ORN, NP  ezetimibe  (ZETIA ) 10 MG tablet Take 1 tablet (10 mg total) by mouth daily. For high cholesterol 06/13/23   Theotis Haze ORN, NP  famotidine  (PEPCID ) 40 MG tablet Take 1 tablet (40 mg total) by mouth daily. 01/23/24   Fleming, [name] W, NP  folic acid  (FOLVITE ) 1 MG tablet Take 1 tablet by mouth once daily 12/05/23   Newlin, Enobong, MD  gabapentin  (NEURONTIN ) 100 MG capsule Take 200 mg by mouth at bedtime. 05/31/23 05/30/24  [provider]  HYDROcodone -acetaminophen  (NORCO/VICODIN) 5-325 MG tablet Take 1-2 tablets by mouth every 6 (six) hours as needed for moderate pain (pain score 4-6) or  severe pain (pain score 7-10). 01/18/24   Amin, Ankit C, MD  hydroxychloroquine  (PLAQUENIL ) 200 MG tablet Take 1 tablet (200 mg total) by mouth 2 (two) times daily. 01/17/24   Amin, Ankit C, MD  losartan  (COZAAR ) 25 MG tablet Take 0.5 tablets (12.5 mg total) by mouth at bedtime. 12/01/22   Thukkani, Arun K, MD  Misc. Devices (BARIATRIC ROLLATOR) MISC Please provide patient with insurance approved bariatric rollator walker. I89.0, R53.81, R26.81 11/05/19   Fleming, [name] W, NP  polyethylene glycol (MIRALAX ) 17 g packet Take 17 g by mouth 2 (two) times daily as needed for moderate constipation or severe constipation. 01/17/24   Amin, Ankit  C, MD    Physical Exam: BP 123/70   Pulse 91   Temp 97.6 F (36.4 C) (Oral)   Resp 18   LMP 01/31/2011   SpO2 99%   General: 58 y.o. year-old female well developed well nourished in no acute distress.  Alert and oriented x3. Cardiovascular: Regular rate and rhythm with no rubs or gallops.  No thyromegaly or JVD noted.  No lower extremity edema. 2/4 pulses in all 4 extremities. Respiratory: Clear to auscultation with no wheezes or rales. Good inspiratory effort. Abdomen: Soft nontender nondistended with normal bowel sounds x4 quadrants. Muskuloskeletal: Lymphedema involving lower extremities bilaterally. Neuro: CN II-XII intact, strength, sensation, reflexes Skin: Bilateral lower extremity skin hyperpigmented in the setting of lymphedema Psychiatry: Judgement and insight appear normal. Mood is appropriate for condition and setting          Labs on Admission:  Basic Metabolic Panel: Recent Labs  Lab 01/23/24 1049 01/27/24 0020  NA 139 137  K 4.4 4.2  CL 104 106  CO2 21 23  GLUCOSE 104* 122*  BUN 21 24*  CREATININE 1.48* 1.47*  CALCIUM  9.9 9.6   Liver Function Tests: Recent Labs  Lab 01/23/24 1049 01/27/24 0020  AST 16 17  ALT 14 14  ALKPHOS 54 46  BILITOT 0.8 0.7  PROT 7.9 7.7  ALBUMIN 3.8 3.2*   No results for input(s): LIPASE,  AMYLASE in the last 168 hours. No results for input(s): AMMONIA in the last 168 hours. CBC: Recent Labs  Lab 01/23/24 1049 01/27/24 0020  WBC 4.8 5.6  NEUTROABS 3.0 3.6  HGB 10.6* 9.8*  HCT 34.2 32.3*  MCV 90 92.0  PLT 334 339   Cardiac Enzymes: No results for input(s): CKTOTAL, CKMB, CKMBINDEX, TROPONINI in the last 168 hours.  BNP (last 3 results) Recent Labs    01/27/24 0020  BNP 144.0*    ProBNP (last 3 results) No results for input(s): PROBNP in the last 8760 hours.  CBG: Recent Labs  Lab 01/27/24 0221  GLUCAP 115*    Radiological Exams on Admission: DG Chest Port 1 View Result Date: 01/27/2024 CLINICAL DATA:  Chest pain. EXAM: PORTABLE CHEST 1 VIEW COMPARISON:  November 16, 2022 FINDINGS: The cardiac silhouette is mildly enlarged and unchanged in size. No acute infiltrate, pleural effusion or pneumothorax is identified. There is stable moderate severity dextroscoliosis of the lower thoracic spine. IMPRESSION: Stable cardiomegaly without acute or active cardiopulmonary disease. Electronically Signed   By: Suzen Dials M.D.   On: 01/27/2024 01:21    EKG: I independently viewed the EKG done and my findings are as followed: Sinus rhythm rate of 93.  QTc 484.  Assessment/Plan Present on Admission:  Chest pain  Principal Problem:   Chest pain  Atypical chest pain rule out ACS No evidence of acute ischemia on 12 lead EKG High-sensitivity troponin 12, 12 Follow transthoracic echocardiogram. Closely monitor on telemetry  History of pulmonary embolism/right lower extremity DVT Continue home Eliquis  Not hypoxic with O2 saturation ranging between 97 and 99% on room air.  Chronic bilateral lower extremity lymphedema Recently diagnosed with left lower extremity cellulitis Afebrile with no leukocytosis.  Recently completed 10 days of antibiotics. Wound care specialist to assist with management of lymphedema Lymphedema care with the guidance of  wound care specialist Pain control as needed.  Hypertension Resume home losartan  when blood pressure allows. Closely monitor vital signs.  Severe morbid obesity BMI 46 Recommend weight loss outpatient with regular physical activity and healthy dieting.  Rheumatoid arthritis Resume home regimen  Chronic anxiety/depression Resume home regimen.  Ambulatory dysfunction Uses a cane at baseline Fall precautions.   Time:  75 minutes   DVT prophylaxis: Home Eliquis  twice daily  Code Status: Full code.  Family Communication: None at bedside.  Disposition Plan: Admitted to telemetry unit.  Consults called: Consider cardiology consult in the morning.  Admission status: Observation status.   Status is: Observation    Terry LOISE Hurst MD [facility] Pager [phone]  If 7PM-7AM, please contact night-coverage [email or web address] Password [email or web address]  01/27/2024, 3:40 AM

## 2024-01-27 NOTE — Progress Notes (Signed)
 DISCHARGE NOTE HOME [name] to be discharged Home per MD order. Floor nurse Discussed prescriptions and follow up appointments with the patient. Prescriptions given to patient; medication list explained in detail. Patient verbalized understanding when this nurse asked if any questions.  Advised patient to do BP twice daily to start a log to take to PCP once has an appt and to call for that appt tomorrow  Skin clean, dry and intact without evidence of skin break down, no evidence of skin tears noted. IV catheter discontinued intact. Site without signs and symptoms of complications. Dressing and pressure applied. Pt denies pain at the site currently. No complaints noted.  Saee LDA for wound left leg at discharge leg in ace wrap at [facility] Patient free of other lines, drains, and wounds.   An After Visit Summary (AVS) was printed and given to the patient. Patient escorted via wheelchair, and discharged home via private auto.  Peyton SHAUNNA Pepper, RN

## 2024-01-28 ENCOUNTER — Telehealth: Payer: Self-pay | Admitting: Nurse Practitioner

## 2024-01-28 ENCOUNTER — Telehealth: Payer: Self-pay

## 2024-01-28 LAB — HEMOGLOBIN A1C
Hgb A1c MFr Bld: 5.7 % — ABNORMAL HIGH (ref 4.8–5.6)
Mean Plasma Glucose: 117 mg/dL

## 2024-01-28 NOTE — Telephone Encounter (Signed)
 Copied from CRM #[id number]. Topic: Clinical - Home Health Verbal Orders >> Jan 28, 2024  3:08 PM Olam RAMAN wrote:  Caller/Agency: [facility] Rushing Number: [phone] Service Requested: Physical Therapy Frequency: 1 week one, 2 times for 3 weeks and one for 3 weeks Any new concerns about the patient? No

## 2024-01-28 NOTE — Transitions of Care (Post Inpatient/ED Visit) (Unsigned)
   01/28/2024  Name: HARLENE PETRALIA MRN: [id number] DOB: 08/03/1965  Today's TOC FU Call Status: Today's TOC FU Call Status:: Successful TOC FU Call Completed TOC FU Call Complete Date: 01/28/24  Patient's Name and Date of Birth confirmed. Name, DOB  Transition Care Management Follow-up Telephone Call Date of Discharge: 01/27/24 Discharge Facility: [facility] Type of Discharge: Inpatient Admission Primary Inpatient Discharge Diagnosis:: chest pain How have you been since you were released from the hospital?: Better (She said she is still tired but has not experienced any palpitations or the concerns that prompted her to contact EMS.) Any questions or concerns?: No  Items Reviewed: Did you receive and understand the discharge instructions provided?: Yes Medications obtained,verified, and reconciled?: No Medications Not Reviewed Reasons:: Other: (She said she has all of her medicaitons and did not have any questions about the meds and did not need to review the med list/  She said that the pharmacist and pharmacy tech reviewed the meds with her very thoroughly prior to her discharge.) Any new allergies since your discharge?: No Do you have support at home?: Yes People in Home [RPT]: alone Name of Support/Comfort Primary Source: She said she can call her aunt or her daughter if she needs assistance  Medications Reviewed Today: Medications Reviewed Today   Medications were not reviewed in this encounter     Home Care and Equipment/Supplies: Were Home Health Services Ordered?: Yes Name of Home Health Agency:: She was receiving home health nursing from [facility] prior to her hospitalization and they will resume care tomorrow. Has Agency set up a time to come to your home?: Yes First Home Health Visit Date: 01/29/24 Any new equipment or medical supplies ordered?: No  Functional Questionnaire: Do you need assistance with bathing/showering or dressing?: No (She said she is able  to do her BLE wound care on the days the home health nurse doesn't visit.) Do you need assistance with meal preparation?: No Do you need assistance with eating?: No Do you have difficulty maintaining continence: No Do you need assistance with getting out of bed/getting out of a chair/moving?: Yes (ambulates with a walker) Do you have difficulty managing or taking your medications?: No  Follow up appointments reviewed: PCP Follow-up appointment confirmed?: No MD Provider Line Number:[phone] Given: No (I told her that I will need to check appointment availabilty and call her back) Specialist Hospital Follow-up appointment confirmed?: Yes Date of Specialist follow-up appointment?: 02/04/24 Follow-Up Specialty Provider:: cardiology Do you need transportation to your follow-up appointment?: No Do you understand care options if your condition(s) worsen?: Yes-patient verbalized understanding  She has not obtained a home BP monitor yet.     SIGNATURE  Slater Diesel, RN

## 2024-01-28 NOTE — Progress Notes (Unsigned)
 Cardiology Office Note:   Date:  02/04/2024  ID:  [name], DOB [birth year], MRN [id number] PCP:  Theotis Haze ORN, NP  [facility] Providers Cardiologist:  Wendel Haws, MD Referring MD: Theotis Haze ORN, NP  Chief Complaint/Reason for Referral: Follow-up atypical chest pain ASSESSMENT:    1. Chest pain, unspecified type   2. Diastolic dysfunction   3. Essential hypertension   4. Rheumatoid arthritis, involving unspecified site, unspecified whether rheumatoid factor present (HCC)   5. History of pulmonary embolus (PE)   6. Prediabetes   7. BMI 45.0-49.9, adult [facility])     PLAN:   In order of problems listed above: Chest pain: Has had reassuring cardiac evaluation with coronary CTA in 2023 demonstrating no calcium  or coronary artery disease.  Obtain CMR as detailed #2 below. Diastolic dysfunction: Given history of chest pain, moderate LVH, small pericardial effusion, and IVCD will obtain CMR to evaluate further.  Continue losartan  25 mg, start Jardiance  10 mg daily Hypertension: Continue losartan  25 mg Rheumatoid arthritis: Continue hydroxychloroquine  200 mg twice daily History of PE with recurrent VTE: Continue Eliquis  5 mg twice daily indefinitely Prediabetes: Continue losartan  25 mg, start Jardiance  10 mg daily Elevated BMI: Diet and exercise modification.  If patient becomes diabetic would consider GLP-1 receptor agonist therapy            Dispo:  Return in about 6 months (around 08/04/2024).       I spent 37 minutes reviewing all clinical data during and prior to this visit including all relevant imaging studies, laboratories, clinical information from other health systems and prior notes from both Cardiology and other specialties, interviewing the patient, conducting a complete physical examination, and coordinating care in order to formulate a comprehensive and personalized evaluation and treatment plan.   History of Present Illness:    FOCUSED PROBLEM  LIST:   Chest pain syndrome CAC 0, no CAD coronary CTA 2023 Diastolic dysfunction Moderate LVH, no significant valve issues, trace pericardial effusion, EF 65 to 70% TTE November 2025 Hypertension PE 2018 with recurrent VTE On indefinite Eliquis  Rheumatoid arthritis Lymphedema Followed by wound clinic Anxiety Asthma Depression Prediabetes Hemoglobin A1c 5.04 January 2024 BMI 49  March 2023: Patient seen for initial consultation for chest pain.  She was referred for coronary CTA and wound clinic evaluation due to lymphedema.   September 2023: The patient is doing very well.  She is lost 30 pounds by diet and exercise.  She feels relatively well.  She has had no recurrent chest pain or dyspnea.  She was hospitalized recently due to issues stemming from her lymphedema.  She is otherwise well without significant complaints today.  Plan: Start losartan  given diastolic dysfunction and LVH.  Stop Imdur , Toprol , and as needed nitroglycerin .   October 2024: The patient was admitted to the hospital and September of this year for treatment of sepsis due to cellulitis.  Lower extremity Dopplers were negative.  Her blood pressure was stable however her losartan  was stopped.  She was discharged on 3 weeks of antibiotics.  She was seen by infectious diseases earlier this week and was doing well.  She has no cardiovascular complaints today.  She is happy with being connected with lymphedema clinic and she is lost 5 L from her legs apparently with their program.  She had some issues with diarrhea and C. difficile was checked which was negative.  She has had no real cardiovascular complaints and has had no recurrent chest pain.  Plan: Restart  losartan  12.5 mg; follow-up 1 year  December 2025:  Patient consents to use of AI scribe. She was recently admitted to the hospital with noncardiac chest pain.  An echocardiogram showed moderate LVH with a preserved ejection fraction and a trivial pericardial effusion.   An outpatient CMR was recommended.  She is here for hospital follow-up.  No further episodes of chest pain have occurred since the hospitalization. No current chest pain or breathing difficulties.  She is currently taking losartan  to manage her heart condition. No issues with urinary tract infections, which is a potential side effect of the new medication being considered.  She has had no issues with her medications.  She continues to go to lymphedema clinic without issues.     Current Medications: Current Meds  Medication Sig   acetaminophen  (TYLENOL ) 500 MG tablet Take 500 mg by mouth every 6 (six) hours as needed for mild pain (pain score 1-3) or moderate pain (pain score 4-6).   albuterol  (VENTOLIN  HFA) 108 (90 Base) MCG/ACT inhaler Inhale 2 puffs into the lungs every 6 (six) hours as needed for wheezing or shortness of breath.   apixaban  (ELIQUIS ) 5 MG TABS tablet Take 1 tablet (5 mg total) by mouth 2 (two) times daily.   buPROPion  (WELLBUTRIN  XL) 150 MG 24 hr tablet Take 1 tablet (150 mg total) by mouth daily. (Patient taking differently: Take 150 mg by mouth at bedtime.)   docusate sodium  (COLACE) 100 MG capsule Take 1 capsule (100 mg total) by mouth 2 (two) times daily. (Patient taking differently: Take 100 mg by mouth at bedtime.)   empagliflozin  (JARDIANCE ) 10 MG TABS tablet Take 1 tablet (10 mg total) by mouth daily before breakfast.   escitalopram  (LEXAPRO ) 10 MG tablet Take 1 tablet (10 mg total) by mouth daily. For depression (Patient taking differently: Take 10 mg by mouth at bedtime. For depression)   ezetimibe  (ZETIA ) 10 MG tablet Take 1 tablet (10 mg total) by mouth daily. For high cholesterol (Patient taking differently: Take 10 mg by mouth at bedtime. For high cholesterol)   famotidine  (PEPCID ) 40 MG tablet Take 1 tablet (40 mg total) by mouth daily. (Patient taking differently: Take 40 mg by mouth at bedtime.)   folic acid  (FOLVITE ) 1 MG tablet Take 1 tablet by mouth once daily  (Patient taking differently: Take 1 mg by mouth at bedtime.)   gabapentin  (NEURONTIN ) 100 MG capsule Take 200 mg by mouth at bedtime.   HYDROcodone -acetaminophen  (NORCO/VICODIN) 5-325 MG tablet Take 1-2 tablets by mouth every 6 (six) hours as needed for moderate pain (pain score 4-6) or severe pain (pain score 7-10).   hydroxychloroquine  (PLAQUENIL ) 200 MG tablet Take 1 tablet (200 mg total) by mouth 2 (two) times daily.   losartan  (COZAAR ) 25 MG tablet TAKE 1/2 (ONE-HALF) TABLET BY MOUTH AT BEDTIME   polyethylene glycol (MIRALAX ) 17 g packet Take 17 g by mouth 2 (two) times daily as needed for moderate constipation or severe constipation.   predniSONE  (DELTASONE ) 5 MG tablet Take 5 mg by mouth at bedtime.     Review of Systems:   Please see the history of present illness.    All other systems reviewed and are negative.     EKGs/Labs/Other Test Reviewed:   EKG: 2025 sinus rhythm with IVCD, LVH  EKG Interpretation Date/Time:    Ventricular Rate:    PR Interval:    QRS Duration:    QT Interval:    QTC Calculation:   R Axis:  Text Interpretation:          CARDIAC STUDIES: Refer to CV Procedures and Imaging Tabs   Risk Assessment/Calculations:          Physical Exam:   VS:  BP 130/80   Pulse 84   Ht 6' (1.829 m)   Wt (!) 346 lb 6.4 oz (157.1 kg)   LMP 01/31/2011   SpO2 96%   BMI 46.98 kg/m        Wt Readings from Last 3 Encounters:  02/04/24 (!) 346 lb 6.4 oz (157.1 kg)  01/23/24 (!) 339 lb 12.8 oz (154.1 kg)  01/13/24 (!) 374 lb 12.5 oz (170 kg)      GENERAL:  No apparent distress, AOx3 HEENT:  No carotid bruits, +2 carotid impulses, no scleral icterus CAR: RRR no murmurs, gallops, rubs, or thrills RES:  Clear to auscultation bilaterally ABD:  Soft, nontender, nondistended, positive bowel sounds x 4 VASC:  +2 radial pulses, +2 carotid pulses NEURO:  CN 2-12 grossly intact; motor and sensory grossly intact PSYCH:  No active depression or anxiety EXT: +4  lymphedema Signed, Akaya Proffit K Tahisha Hakim, MD  02/04/2024 5:42 PM    [facility] [address] [place], [place], [place]  [address] Phone: [phone]; Fax: [phone]   Note:  This document was prepared using Dragon voice recognition software and may include unintentional dictation errors.

## 2024-01-29 NOTE — Telephone Encounter (Signed)
 Verbal orders given to Ms.Liji.

## 2024-01-31 ENCOUNTER — Telehealth: Payer: Self-pay

## 2024-01-31 NOTE — Telephone Encounter (Signed)
 I called the patient and scheduled her for a hospital follow up with Haze Servant, NP 02/12/2024 @ 1330

## 2024-02-01 ENCOUNTER — Telehealth: Payer: Self-pay

## 2024-02-01 NOTE — Telephone Encounter (Signed)
 Order placed. [employer] will reach out to patient with delivery information.

## 2024-02-01 NOTE — Telephone Encounter (Signed)
 Copied from CRM [id number]. Topic: Clinical - Order For Equipment >> Feb 01, 2024  3:40 PM [name] wrote: [name] [facility] [phone] Called on behalf of patient, states patient needs a new 4-wheel walker with the seat. States the brakes on patient's currently walker isn't working therefor requesting she receives a new one.

## 2024-02-04 ENCOUNTER — Ambulatory Visit: Attending: Internal Medicine | Admitting: Internal Medicine

## 2024-02-04 ENCOUNTER — Encounter: Payer: Self-pay | Admitting: Internal Medicine

## 2024-02-04 VITALS — BP 130/80 | HR 84 | Ht 72.0 in | Wt 346.4 lb

## 2024-02-04 DIAGNOSIS — R079 Chest pain, unspecified: Secondary | ICD-10-CM

## 2024-02-04 DIAGNOSIS — M069 Rheumatoid arthritis, unspecified: Secondary | ICD-10-CM

## 2024-02-04 DIAGNOSIS — I5189 Other ill-defined heart diseases: Secondary | ICD-10-CM | POA: Diagnosis not present

## 2024-02-04 DIAGNOSIS — Z6841 Body Mass Index (BMI) 40.0 and over, adult: Secondary | ICD-10-CM

## 2024-02-04 DIAGNOSIS — Z86711 Personal history of pulmonary embolism: Secondary | ICD-10-CM

## 2024-02-04 DIAGNOSIS — R7303 Prediabetes: Secondary | ICD-10-CM | POA: Diagnosis not present

## 2024-02-04 DIAGNOSIS — I10 Essential (primary) hypertension: Secondary | ICD-10-CM

## 2024-02-04 MED ORDER — EMPAGLIFLOZIN 10 MG PO TABS: 10.0000 mg | ORAL_TABLET | Freq: Every day | ORAL | 6 refills | Status: DC

## 2024-02-04 NOTE — Patient Instructions (Signed)
 Medication Instructions:  Your physician has recommended you make the following change in your medication:  START Jardiance  10 mg daily  *If you need a refill on your cardiac medications before your next appointment, please call your pharmacy*  Lab Work: None ordered  If you have any lab test that is abnormal or we need to change your treatment, we will call you to review the results.  Testing/Procedures: Your physician has requested that you have a cardiac MRI. Cardiac MRI uses a computer to create images of your heart as its beating, producing both still and moving pictures of your heart and major blood vessels. For further information please visit [email or web address]. Please follow the instruction sheet given to you today for more information.   Follow-Up: At [facility], you and your health needs are our priority.  As part of our continuing mission to provide you with exceptional heart care, our providers are all part of one team.  This team includes your primary Cardiologist (physician) and Advanced Practice Providers or APPs (Physician Assistants and Nurse Practitioners) who all work together to provide you with the care you need, when you need it.  Your next appointment:   6 month(s)  Provider:   Glendia Ferrier, PA    Thank you for choosing [facility]!!    [phone]

## 2024-02-05 ENCOUNTER — Ambulatory Visit: Payer: Self-pay

## 2024-02-05 NOTE — Telephone Encounter (Signed)
 FYI Only or Action Required?: Action required by provider: clinical question for provider and update on patient condition.  Patient was last seen in primary care on 01/23/2024 by Theotis Haze ORN, NP.  Called Nurse Triage reporting Shoulder Pain.  Symptoms began several days ago.  Interventions attempted: Prescription medications: rheumatoid medications; MTX, Rest, hydration, or home remedies, and Other: scheduled OT.  Symptoms are: gradually worsening.  Triage Disposition: See PCP When Office is Open (Within 3 Days)  Patient/caregiver understands and will follow disposition?: Yes   Copied from CRM #[id number]. Topic: Clinical - Red Word Triage >> Feb 05, 2024 12:39 PM [name] wrote: Red Word that prompted transfer to Nurse Triage: Patient with pain from shoulder to hand  Reason for Disposition  [1] MODERATE pain (e.g., interferes with normal activities) AND [2] present > 3 days  Answer Assessment - Initial Assessment Questions Pt called in stating that her rheumatoid arthritis is flared with the cooler weather and she is requesting something for break through pain. Pt reports taking plaquenil  and methotrexate as prescribed with with cold weather and use of hands during OT, pain is worsening. She states pain is in bilateral shoulders to hands. She states she has prednisone  but was directed to only take it once a day. Pt requesting alternative medication be sent to [facility]. She denies any chest pain, reports she saw her cardiologist yesterday and has a cardiac MRI scheduled 02/19/24. Please advise.    1. ONSET: When did the pain start?     Started last couple days, worsening with cooler weather   2. LOCATION: Where is the pain located?     Bilateral shoulders down to hands   3. PAIN: How bad is the pain? (Scale 1-10; or mild, moderate, severe)     Severe   4. WORK OR EXERCISE: Has there been any recent work or exercise that involved this part of the body?     None   5.  CAUSE: What do you think is causing the shoulder pain?     Reports rheumatoid arthritis; pt has scheduled OT today   6. OTHER SYMPTOMS: Do you have any other symptoms? (e.g., neck pain, swelling, rash, fever, numbness, weakness)     None  Protocols used: Shoulder Pain-A-AH

## 2024-02-05 NOTE — Telephone Encounter (Signed)
 Needs to follow up with rheumatologist regarding break through pain medication

## 2024-02-06 NOTE — Telephone Encounter (Signed)
 Return call was answered, but could not verify if it was [name]. Will return call later today.

## 2024-02-07 NOTE — Progress Notes (Signed)
 [name]                                          MRN: [id number]   02/07/2024   The VBCI Quality Team Specialist reviewed this patient medical record for the purposes of chart review for care gap closure. The following were reviewed: abstraction for care gap closure-controlling blood pressure.    VBCI Quality Team

## 2024-02-12 ENCOUNTER — Ambulatory Visit: Attending: Nurse Practitioner | Admitting: Nurse Practitioner

## 2024-02-12 ENCOUNTER — Encounter: Payer: Self-pay | Admitting: Nurse Practitioner

## 2024-02-12 VITALS — BP 137/77 | HR 92 | Ht 72.0 in | Wt 356.0 lb

## 2024-02-12 DIAGNOSIS — I10 Essential (primary) hypertension: Secondary | ICD-10-CM | POA: Diagnosis not present

## 2024-02-12 DIAGNOSIS — E538 Deficiency of other specified B group vitamins: Secondary | ICD-10-CM | POA: Diagnosis not present

## 2024-02-12 DIAGNOSIS — D649 Anemia, unspecified: Secondary | ICD-10-CM

## 2024-02-12 MED ORDER — FOLIC ACID 1 MG PO TABS: 1.0000 mg | ORAL_TABLET | Freq: Every day | ORAL | 0 refills | Status: AC

## 2024-02-12 NOTE — Progress Notes (Unsigned)
 Assessment & Plan:  [name] was seen today for medical management of chronic issues.  Diagnoses and all orders for this visit:  Low serum vitamin B12 -     Vitamin B12  Anemia, unspecified type -     CBC with Differential -     Ambulatory referral to Gastroenterology -     CMP14+EGFR -     folic acid  (FOLVITE ) 1 MG tablet; Take 1 tablet (1 mg total) by mouth daily.    Patient has been counseled on age-appropriate routine health concerns for screening and prevention. These are reviewed and up-to-date. Referrals have been placed accordingly. Immunizations are up-to-date or declined.    Subjective:   Chief Complaint  Patient presents with   Medical Management of Chronic Issues    KINBERLY [name] 58 y.o. female presents to office today      ROS  Past Medical History:  Diagnosis Date   Anemia    Anxiety    Arthritis    Asthma    hx as child - no prob as adult - no inhaler   Blood transfusion 01/22/11   transfusion 2 units at [facility]   Depression 08/2010   psych assessment   Dyspnea    Fibroid    Headache(784.0)    rx for imitrex - last one jan   Keloid    Lymphedema    Pulmonary embolism (HCC)     Past Surgical History:  Procedure Laterality Date   ABDOMINAL HYSTERECTOMY     COLONOSCOPY N/A 11/08/2017   Procedure: COLONOSCOPY;  Surgeon: Harvey Margo CROME, MD;  Location: [facility];  Service: Endoscopy;  Laterality: N/A;  2:00pm   FLEXIBLE SIGMOIDOSCOPY N/A 09/17/2017   Procedure: FLEXIBLE SIGMOIDOSCOPY;  Surgeon: Harvey Margo CROME, MD;  Location: [facility];  Service: Endoscopy;  Laterality: N/A;   HERNIA REPAIR  2009   umbilical   POLYPECTOMY  11/08/2017   Procedure: POLYPECTOMY;  Surgeon: Harvey Margo CROME, MD;  Location: [facility];  Service: Endoscopy;;  ascending colon (CSx1), transverse colon (CS x1), splenic flexure (HSx1)   SVD     Spontaneous vaginal delivery; x 1    Family History  Problem Relation Age of Onset   Heart disease Mother     Hypertension Mother    Hypertension Father    Colon cancer Father 79       Passed away 8 yrs old   Hypertension Sister    Cancer Maternal Grandmother        gastric cancer   Cancer Maternal Grandfather        pancreatic cancer   Colon cancer Paternal Grandmother    Colon cancer Paternal Uncle    Colon polyps Neg Hx     Social History Reviewed with no changes to be made today.   Outpatient Medications Prior to Visit  Medication Sig Dispense Refill   acetaminophen  (TYLENOL ) 500 MG tablet Take 500 mg by mouth every 6 (six) hours as needed for mild pain (pain score 1-3) or moderate pain (pain score 4-6).     albuterol  (VENTOLIN  HFA) 108 (90 Base) MCG/ACT inhaler Inhale 2 puffs into the lungs every 6 (six) hours as needed for wheezing or shortness of breath. 18 g 2   apixaban  (ELIQUIS ) 5 MG TABS tablet Take 1 tablet (5 mg total) by mouth 2 (two) times daily. 180 tablet 3   buPROPion  (WELLBUTRIN  XL) 150 MG 24 hr tablet Take 1 tablet (150 mg total) by mouth daily. (Patient taking  differently: Take 150 mg by mouth at bedtime.) 90 tablet 1   docusate sodium  (COLACE) 100 MG capsule Take 1 capsule (100 mg total) by mouth 2 (two) times daily. (Patient taking differently: Take 100 mg by mouth at bedtime.) 60 capsule 0   empagliflozin  (JARDIANCE ) 10 MG TABS tablet Take 1 tablet (10 mg total) by mouth daily before breakfast. 30 tablet 6   escitalopram  (LEXAPRO ) 10 MG tablet Take 1 tablet (10 mg total) by mouth daily. For depression (Patient taking differently: Take 10 mg by mouth at bedtime. For depression) 90 tablet 1   ezetimibe  (ZETIA ) 10 MG tablet Take 1 tablet (10 mg total) by mouth daily. For high cholesterol (Patient taking differently: Take 10 mg by mouth at bedtime. For high cholesterol) 90 tablet 3   famotidine  (PEPCID ) 40 MG tablet Take 1 tablet (40 mg total) by mouth daily. (Patient taking differently: Take 40 mg by mouth at bedtime.) 90 tablet 1   gabapentin  (NEURONTIN ) 100 MG capsule Take  200 mg by mouth at bedtime.     HYDROcodone -acetaminophen  (NORCO/VICODIN) 5-325 MG tablet Take 1-2 tablets by mouth every 6 (six) hours as needed for moderate pain (pain score 4-6) or severe pain (pain score 7-10). 30 tablet 0   hydroxychloroquine  (PLAQUENIL ) 200 MG tablet Take 1 tablet (200 mg total) by mouth 2 (two) times daily.     losartan  (COZAAR ) 25 MG tablet TAKE 1/2 (ONE-HALF) TABLET BY MOUTH AT BEDTIME 45 tablet 0   polyethylene glycol (MIRALAX ) 17 g packet Take 17 g by mouth 2 (two) times daily as needed for moderate constipation or severe constipation.     predniSONE  (DELTASONE ) 5 MG tablet Take 5 mg by mouth at bedtime.     folic acid  (FOLVITE ) 1 MG tablet Take 1 tablet by mouth once daily (Patient taking differently: Take 1 mg by mouth at bedtime.) 90 tablet 0   No facility-administered medications prior to visit.    Allergies[1]     Objective:    BP 137/77 (BP Location: Left Arm, Patient Position: Sitting, Cuff Size: Normal) Comment (BP Location): lower left are  Pulse 92   Ht 6' (1.829 m)   Wt (!) 356 lb (161.5 kg)   LMP 01/31/2011   SpO2 100%   BMI 48.28 kg/m  Wt Readings from Last 3 Encounters:  02/12/24 (!) 356 lb (161.5 kg)  02/04/24 (!) 346 lb 6.4 oz (157.1 kg)  01/23/24 (!) 339 lb 12.8 oz (154.1 kg)    Physical Exam       Patient has been counseled extensively about nutrition and exercise as well as the importance of adherence with medications and regular follow-up. The patient was given clear instructions to go to ER or return to medical center if symptoms don't improve, worsen or new problems develop. The patient verbalized understanding.   Follow-up: No follow-ups on file.   Lauren LELON Servant, FNP-BC [facility] [place], [place] [phone]   02/12/2024, 2:14 PM    [1]  Allergies Allergen Reactions   Abatacept Anaphylaxis and Other (See Comments)    Orencia: Anaphylaxis-Streptomycin Clickject    Bee Venom  Anaphylaxis   Cefadroxil  Diarrhea   Keflex  [Cephalexin ] Diarrhea, Nausea Only and Other (See Comments)    Causes extreme sleepiness, nausea, and diarrhea   Augmentin  [Amoxicillin -Pot Clavulanate] Hives, Itching and Other (See Comments)    Pt reports no recollection of any reactions when taking penicillin in past   Latex Hives and Other (See Comments)  Local reaction (welts). Patient denies any wheezing or other reaction with latex exposure.   Nulytely  [Peg 3350 -Kcl-Na Bicarb-Nacl] Nausea And Vomiting and Other (See Comments)    NAUSEA AND VOMITING. MAY TOLERATE LOW VOLUME PREP.   Tomato Other (See Comments)    Caused sores in the mouth

## 2024-02-12 NOTE — Telephone Encounter (Signed)
 Late entry: Call returned to patient 12/10, but unable to reach by phone. Voicemail left to return call.

## 2024-02-13 LAB — CMP14+EGFR
ALT: 12 IU/L (ref 0–32)
AST: 16 IU/L (ref 0–40)
Albumin: 4.1 g/dL (ref 3.8–4.9)
Alkaline Phosphatase: 62 IU/L (ref 49–135)
BUN/Creatinine Ratio: 15 (ref 9–23)
BUN: 15 mg/dL (ref 6–24)
Bilirubin Total: 0.8 mg/dL (ref 0.0–1.2)
CO2: 21 mmol/L (ref 20–29)
Calcium: 10 mg/dL (ref 8.7–10.2)
Chloride: 106 mmol/L (ref 96–106)
Creatinine, Ser: 1.02 mg/dL — ABNORMAL HIGH (ref 0.57–1.00)
Globulin, Total: 3.8 g/dL (ref 1.5–4.5)
Glucose: 91 mg/dL (ref 70–99)
Potassium: 4.1 mmol/L (ref 3.5–5.2)
Sodium: 140 mmol/L (ref 134–144)
Total Protein: 7.9 g/dL (ref 6.0–8.5)
eGFR: 65 mL/min/1.73 (ref >59)

## 2024-02-13 LAB — CBC WITH DIFFERENTIAL/PLATELET
Basophils Absolute: 0 x10E3/uL (ref 0.0–0.2)
Basos: 1 %
EOS (ABSOLUTE): 0.3 x10E3/uL (ref 0.0–0.4)
Eos: 5 %
Hematocrit: 32.7 % — ABNORMAL LOW (ref 34.0–46.6)
Hemoglobin: 10.3 g/dL — ABNORMAL LOW (ref 11.1–15.9)
Immature Grans (Abs): 0 x10E3/uL (ref 0.0–0.1)
Immature Granulocytes: 0 %
Lymphocytes Absolute: 1 x10E3/uL (ref 0.7–3.1)
Lymphs: 19 %
MCH: 27.8 pg (ref 26.6–33.0)
MCHC: 31.5 g/dL (ref 31.5–35.7)
MCV: 88 fL (ref 79–97)
Monocytes Absolute: 0.6 x10E3/uL (ref 0.1–0.9)
Monocytes: 11 %
Neutrophils Absolute: 3.5 x10E3/uL (ref 1.4–7.0)
Neutrophils: 64 %
Platelets: 275 x10E3/uL (ref 150–450)
RBC: 3.71 x10E6/uL — ABNORMAL LOW (ref 3.77–5.28)
RDW: 14.7 % (ref 11.7–15.4)
WBC: 5.5 x10E3/uL (ref 3.4–10.8)

## 2024-02-13 LAB — VITAMIN B12: Vitamin B-12: 246 pg/mL (ref 232–1245)

## 2024-02-14 NOTE — Progress Notes (Signed)
 [name]                                          MRN: [id number]   02/14/2024   The VBCI Quality Team Specialist reviewed this patient medical record for the purposes of chart review for care gap closure. The following were reviewed: abstraction for care gap closure-controlling blood pressure.    VBCI Quality Team

## 2024-02-15 ENCOUNTER — Encounter (HOSPITAL_COMMUNITY): Payer: Self-pay

## 2024-02-16 ENCOUNTER — Ambulatory Visit: Payer: Self-pay | Admitting: Nurse Practitioner

## 2024-02-19 ENCOUNTER — Other Ambulatory Visit: Payer: Self-pay | Admitting: Internal Medicine

## 2024-02-19 ENCOUNTER — Encounter: Payer: Self-pay | Admitting: Nurse Practitioner

## 2024-02-19 ENCOUNTER — Ambulatory Visit (HOSPITAL_COMMUNITY)
Admission: RE | Admit: 2024-02-19 | Discharge: 2024-02-19 | Disposition: A | Discharge status: Home / Self Care | Source: Ambulatory Visit | Attending: Internal Medicine | Admitting: Internal Medicine

## 2024-02-19 DIAGNOSIS — E559 Vitamin D deficiency, unspecified: Secondary | ICD-10-CM | POA: Diagnosis not present

## 2024-02-19 DIAGNOSIS — D6861 Antiphospholipid syndrome: Secondary | ICD-10-CM | POA: Diagnosis not present

## 2024-02-19 DIAGNOSIS — Z6841 Body Mass Index (BMI) 40.0 and over, adult: Secondary | ICD-10-CM | POA: Diagnosis not present

## 2024-02-19 DIAGNOSIS — I5189 Other ill-defined heart diseases: Secondary | ICD-10-CM | POA: Diagnosis present

## 2024-02-19 DIAGNOSIS — I890 Lymphedema, not elsewhere classified: Secondary | ICD-10-CM | POA: Diagnosis not present

## 2024-02-19 DIAGNOSIS — M0579 Rheumatoid arthritis with rheumatoid factor of multiple sites without organ or systems involvement: Secondary | ICD-10-CM | POA: Diagnosis not present

## 2024-02-19 DIAGNOSIS — L0390 Cellulitis, unspecified: Secondary | ICD-10-CM | POA: Diagnosis not present

## 2024-02-19 DIAGNOSIS — R5382 Chronic fatigue, unspecified: Secondary | ICD-10-CM | POA: Diagnosis not present

## 2024-02-19 MED ORDER — GADOBUTROL 1 MMOL/ML IV SOLN
10.0000 mL | Freq: Once | INTRAVENOUS | Status: AC | PRN
  Administered 2024-02-19: 10 mL via INTRAVENOUS

## 2024-02-19 NOTE — Progress Notes (Signed)
 [name]                                          MRN: [id number]   02/19/2024   The VBCI Quality Team Specialist reviewed this patient medical record for the purposes of chart review for care gap closure. The following were reviewed: abstraction for care gap closure-controlling blood pressure.    VBCI Quality Team

## 2024-02-22 ENCOUNTER — Ambulatory Visit: Payer: Self-pay | Admitting: Internal Medicine

## 2024-03-17 ENCOUNTER — Telehealth: Payer: Self-pay | Admitting: Internal Medicine

## 2024-03-17 NOTE — Addendum Note (Signed)
 Addended by: GORDON RONAL SQUIBB on: 03/17/2024 05:17 PM   Modules accepted: Orders

## 2024-03-17 NOTE — Telephone Encounter (Signed)
 States this medication has caused her to have a yeast infection. Will forward over to the provider for guidance.

## 2024-03-17 NOTE — Telephone Encounter (Signed)
 Spoke with pt and let her know to stop taking the medication. Pt stated she took a dose this morning but that she would not take anymore. Pt told that the medication will be added to her allergy list. Pt verbalized understanding of plan and had no further questions at this time.

## 2024-03-17 NOTE — Telephone Encounter (Signed)
 Pt c/o medication issue:  1. Name of Medication: empagliflozin  (JARDIANCE ) 10 MG TABS tablet   2. How are you currently taking this medication (dosage and times per day)? As written   3. Are you having a reaction (difficulty breathing--STAT)? No  4. What is your medication issue? Pt states she has a yeast infection and would like to know if she should stop this medication

## 2024-03-19 ENCOUNTER — Telehealth: Payer: Self-pay | Admitting: Nurse Practitioner

## 2024-03-19 NOTE — Telephone Encounter (Signed)
 Copied from CRM #[id number]. Topic: Clinical - Home Health Verbal Orders >> Mar 19, 2024  3:26 PM   Hadassah PARAS wrote:  Caller/Agency: Shona Gavel [facility] Callback Number: [phone] Service Requested: Skilled nursing  Frequency: twice a week for 9 weeks Any new concerns about the patient? Yes; would like to req OT eval

## 2024-03-19 NOTE — Telephone Encounter (Signed)
Verbal orders can be given. 

## 2024-03-20 NOTE — Telephone Encounter (Signed)
 Patient identified by name and date of birth by Ms. Rhonda. Verbal orders given.

## 2024-03-26 ENCOUNTER — Telehealth: Payer: Self-pay | Admitting: Nurse Practitioner

## 2024-03-26 NOTE — Telephone Encounter (Signed)
 Copied from CRM [id number]. Topic: Clinical - Home Health Verbal Orders >> Mar 26, 2024 11:56 AM   Jasmin G wrote:  Caller/Agency: Mr. Charlie from [facility] Callback Number: [phone], it's okay to leave voicemail Service Requested: Occupational Therapy Frequency: Requesting for appt to me moved to next week Any new concerns about the patient? No

## 2024-03-27 NOTE — Telephone Encounter (Signed)
 Voicemail confirming appointment reschedule.

## 2024-04-04 ENCOUNTER — Ambulatory Visit: Payer: Self-pay

## 2024-04-04 ENCOUNTER — Emergency Department (HOSPITAL_COMMUNITY)
Admission: EM | Admit: 2024-04-04 | Source: Home / Self Care | Attending: Emergency Medicine | Admitting: Emergency Medicine

## 2024-04-04 ENCOUNTER — Emergency Department (HOSPITAL_COMMUNITY)

## 2024-04-04 ENCOUNTER — Encounter (HOSPITAL_COMMUNITY): Payer: Self-pay | Admitting: Emergency Medicine

## 2024-04-04 ENCOUNTER — Other Ambulatory Visit: Payer: Self-pay

## 2024-04-04 DIAGNOSIS — R52 Pain, unspecified: Secondary | ICD-10-CM

## 2024-04-04 DIAGNOSIS — R531 Weakness: Secondary | ICD-10-CM

## 2024-04-04 LAB — CBC WITH DIFFERENTIAL/PLATELET
Abs Immature Granulocytes: 0.03 10*3/uL (ref 0.00–0.07)
Basophils Absolute: 0 10*3/uL (ref 0.0–0.1)
Basophils Relative: 0 %
Eosinophils Absolute: 0.3 10*3/uL (ref 0.0–0.5)
Eosinophils Relative: 4 %
HCT: 29.9 % — ABNORMAL LOW (ref 36.0–46.0)
Hemoglobin: 8.9 g/dL — ABNORMAL LOW (ref 12.0–15.0)
Immature Granulocytes: 0 %
Lymphocytes Relative: 12 %
Lymphs Abs: 0.9 10*3/uL (ref 0.7–4.0)
MCH: 27.7 pg (ref 26.0–34.0)
MCHC: 29.8 g/dL — ABNORMAL LOW (ref 30.0–36.0)
MCV: 93.1 fL (ref 80.0–100.0)
Monocytes Absolute: 0.8 10*3/uL (ref 0.1–1.0)
Monocytes Relative: 11 %
Neutro Abs: 5.1 10*3/uL (ref 1.7–7.7)
Neutrophils Relative %: 73 %
Platelets: 272 10*3/uL (ref 150–400)
RBC: 3.21 MIL/uL — ABNORMAL LOW (ref 3.87–5.11)
RDW: 16 % — ABNORMAL HIGH (ref 11.5–15.5)
WBC: 7.1 10*3/uL (ref 4.0–10.5)
nRBC: 0 % (ref 0.0–0.2)

## 2024-04-04 LAB — URINALYSIS, ROUTINE W REFLEX MICROSCOPIC
Bacteria, UA: NONE SEEN
Bilirubin Urine: NEGATIVE
Glucose, UA: NEGATIVE mg/dL
Ketones, ur: NEGATIVE mg/dL
Leukocytes,Ua: NEGATIVE
Nitrite: NEGATIVE
Protein, ur: NEGATIVE mg/dL
Specific Gravity, Urine: 1.009 (ref 1.005–1.030)
pH: 5 (ref 5.0–8.0)

## 2024-04-04 LAB — COMPREHENSIVE METABOLIC PANEL WITH GFR
ALT: 8 U/L (ref 0–44)
AST: 28 U/L (ref 15–41)
Albumin: 3.5 g/dL (ref 3.5–5.0)
Alkaline Phosphatase: 44 U/L (ref 38–126)
Anion gap: 10 (ref 5–15)
BUN: 15 mg/dL (ref 6–20)
CO2: 24 mmol/L (ref 22–32)
Calcium: 10.4 mg/dL — ABNORMAL HIGH (ref 8.9–10.3)
Chloride: 104 mmol/L (ref 98–111)
Creatinine, Ser: 0.81 mg/dL (ref 0.44–1.00)
GFR, Estimated: >60 mL/min
Glucose, Bld: 109 mg/dL — ABNORMAL HIGH (ref 70–99)
Potassium: 3.6 mmol/L (ref 3.5–5.1)
Sodium: 138 mmol/L (ref 135–145)
Total Bilirubin: 1.2 mg/dL (ref 0.0–1.2)
Total Protein: 8.1 g/dL (ref 6.5–8.1)

## 2024-04-04 MED ORDER — HYDROCODONE-ACETAMINOPHEN 5-325 MG PO TABS
1.0000 | ORAL_TABLET | Freq: Once | ORAL | Status: AC
  Administered 2024-04-04: 1 via ORAL
  Filled 2024-04-04: qty 1

## 2024-04-04 NOTE — Telephone Encounter (Signed)
 Copied from CRM [id number]. Topic: Clinical - Home Health Verbal Orders >> Apr 04, 2024  1:49 PM   Berwyn MATSU wrote:  Caller/Agency: Mr. Charlie from [facility] Callback Number: [phone], it's okay to leave voicemail Service Requested: Occupational Therapy Frequency: Requesting for appt to be moved to next week new order needed as patient advised she was on her way to ER was scheduled for today 04/04/24 Any new concerns about the patient? Yes patient indicated in voicemail that she had fallen and had ems on the way to take her to ER.

## 2024-04-04 NOTE — Telephone Encounter (Signed)
 Patient identified by name and date of birth by Mr. Charlie. Verbal orders given.

## 2024-04-04 NOTE — Telephone Encounter (Signed)
 PAS calling nurse triage to inform Charlie, OT with [facility], received a call from the patient that she had fallen and called EMS. He told PAS he called back multiple times and has not been able to get a hold of her and wants nurse triage to try calling.  RN called patient' s phone twice. No answer and left a voicemail. Routing to clinic due to patient told OT she is already seeking care by calling EMS.   Message from Berwyn MATSU sent at 04/04/2024  1:52 PM EST  Reason for Triage: Patient called OT to advised she fallen and had ems on the way; per OT Richard has not been able to get a hold of her and would like to report. No idea which hospital she went to or an update for patient.   Reason for Disposition  [1] Other NON-URGENT information for PCP AND [2] does not require PCP response  Answer Assessment - Initial Assessment Questions 1. REASON FOR CALL or QUESTION: What is your reason for calling today? or How can I best     Richard told PAS he wanted nurse triage to try to get a hold of patient due to she call him and said she had fallen and called EMS.  2. CALLER: Document the source of call. (e.g., laboratory staff, caregiver or patient).     OT [facility], Richard. Hung up with PAS prior to speaking to RN.  Protocols used: PCP Call - No Triage-A-AH
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
## 2024-04-04 NOTE — Telephone Encounter (Signed)
 FYI

## 2024-04-04 NOTE — ED Notes (Signed)
 EDP at [facility]
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
## 2024-04-25 ENCOUNTER — Ambulatory Visit: Admitting: Nurse Practitioner

## 2024-06-03 ENCOUNTER — Ambulatory Visit
